# Patient Record
Sex: Male | Born: 1945
Health system: Southern US, Community
[De-identification: ages and names within clinical notes are randomized; demographics above are authoritative.]

## PROBLEM LIST (undated history)

## (undated) DIAGNOSIS — R3121 Asymptomatic microscopic hematuria: Secondary | ICD-10-CM

## (undated) DIAGNOSIS — J45909 Unspecified asthma, uncomplicated: Secondary | ICD-10-CM

## (undated) DIAGNOSIS — R918 Other nonspecific abnormal finding of lung field: Secondary | ICD-10-CM

## (undated) DIAGNOSIS — J309 Allergic rhinitis, unspecified: Secondary | ICD-10-CM

## (undated) DIAGNOSIS — I483 Typical atrial flutter: Secondary | ICD-10-CM

## (undated) DIAGNOSIS — R0600 Dyspnea, unspecified: Secondary | ICD-10-CM

## (undated) DIAGNOSIS — Z7901 Long term (current) use of anticoagulants: Secondary | ICD-10-CM

## (undated) DIAGNOSIS — R791 Abnormal coagulation profile: Secondary | ICD-10-CM

## (undated) DIAGNOSIS — I119 Hypertensive heart disease without heart failure: Secondary | ICD-10-CM

## (undated) DIAGNOSIS — I480 Paroxysmal atrial fibrillation: Secondary | ICD-10-CM

## (undated) DIAGNOSIS — E7849 Other hyperlipidemia: Secondary | ICD-10-CM

## (undated) DIAGNOSIS — J90 Pleural effusion, not elsewhere classified: Secondary | ICD-10-CM

## (undated) DIAGNOSIS — Z9889 Other specified postprocedural states: Secondary | ICD-10-CM

## (undated) DIAGNOSIS — I4892 Unspecified atrial flutter: Secondary | ICD-10-CM

## (undated) DIAGNOSIS — G8929 Other chronic pain: Secondary | ICD-10-CM

## (undated) DIAGNOSIS — R21 Rash and other nonspecific skin eruption: Secondary | ICD-10-CM

## (undated) DIAGNOSIS — M549 Dorsalgia, unspecified: Secondary | ICD-10-CM

## (undated) DIAGNOSIS — J449 Chronic obstructive pulmonary disease, unspecified: Secondary | ICD-10-CM

## (undated) DIAGNOSIS — I272 Pulmonary hypertension, unspecified: Secondary | ICD-10-CM

## (undated) DIAGNOSIS — S46819A Strain of other muscles, fascia and tendons at shoulder and upper arm level, unspecified arm, initial encounter: Secondary | ICD-10-CM

## (undated) DIAGNOSIS — I4891 Unspecified atrial fibrillation: Secondary | ICD-10-CM

## (undated) DIAGNOSIS — R05 Cough: Secondary | ICD-10-CM

## (undated) DIAGNOSIS — Z125 Encounter for screening for malignant neoplasm of prostate: Secondary | ICD-10-CM

## (undated) DIAGNOSIS — Z951 Presence of aortocoronary bypass graft: Secondary | ICD-10-CM

## (undated) DIAGNOSIS — D3001 Benign neoplasm of right kidney: Secondary | ICD-10-CM

## (undated) DIAGNOSIS — J811 Chronic pulmonary edema: Secondary | ICD-10-CM

## (undated) DIAGNOSIS — I472 Ventricular tachycardia: Secondary | ICD-10-CM

## (undated) DIAGNOSIS — I509 Heart failure, unspecified: Secondary | ICD-10-CM

## (undated) DIAGNOSIS — M5416 Radiculopathy, lumbar region: Secondary | ICD-10-CM

## (undated) DIAGNOSIS — R55 Syncope and collapse: Secondary | ICD-10-CM

## (undated) DIAGNOSIS — N411 Chronic prostatitis: Secondary | ICD-10-CM

## (undated) DIAGNOSIS — R7881 Bacteremia: Secondary | ICD-10-CM

## (undated) DIAGNOSIS — I10 Essential (primary) hypertension: Secondary | ICD-10-CM

## (undated) DIAGNOSIS — E785 Hyperlipidemia, unspecified: Secondary | ICD-10-CM

## (undated) DIAGNOSIS — B952 Enterococcus as the cause of diseases classified elsewhere: Secondary | ICD-10-CM

## (undated) DIAGNOSIS — R0982 Postnasal drip: Secondary | ICD-10-CM

## (undated) DIAGNOSIS — I25119 Atherosclerotic heart disease of native coronary artery with unspecified angina pectoris: Secondary | ICD-10-CM

## (undated) DIAGNOSIS — R002 Palpitations: Secondary | ICD-10-CM

## (undated) DIAGNOSIS — L720 Epidermal cyst: Secondary | ICD-10-CM

## (undated) DIAGNOSIS — M310 Hypersensitivity angiitis: Secondary | ICD-10-CM

## (undated) DIAGNOSIS — F419 Anxiety disorder, unspecified: Secondary | ICD-10-CM

## (undated) DIAGNOSIS — Z952 Presence of prosthetic heart valve: Secondary | ICD-10-CM

## (undated) DIAGNOSIS — I482 Chronic atrial fibrillation, unspecified: Secondary | ICD-10-CM

## (undated) DIAGNOSIS — R600 Localized edema: Secondary | ICD-10-CM

## (undated) DIAGNOSIS — N200 Calculus of kidney: Secondary | ICD-10-CM

## (undated) HISTORY — DX: Benign neoplasm of right kidney: D30.01

## (undated) HISTORY — DX: Other hyperlipidemia: E78.49

## (undated) HISTORY — DX: Unspecified atrial fibrillation: I48.91

## (undated) HISTORY — DX: Long term (current) use of anticoagulants: Z79.01

## (undated) HISTORY — DX: Other specified postprocedural states: Z98.890

## (undated) HISTORY — DX: Other nonspecific abnormal finding of lung field: R91.8

## (undated) HISTORY — DX: Rash and other nonspecific skin eruption: R21

## (undated) HISTORY — DX: Hypersensitivity angiitis: M31.0

## (undated) HISTORY — DX: Hyperlipidemia, unspecified: E78.5

## (undated) HISTORY — DX: Essential (primary) hypertension: I10

## (undated) HISTORY — DX: Enterococcus as the cause of diseases classified elsewhere: B95.2

## (undated) HISTORY — DX: Epidermal cyst: L72.0

## (undated) HISTORY — DX: Chronic prostatitis: N41.1

## (undated) HISTORY — DX: Postnasal drip: R09.82

## (undated) HISTORY — DX: Allergic rhinitis, unspecified: J30.9

## (undated) HISTORY — PX: FOOT SURGERY: SHX648

## (undated) HISTORY — DX: Pulmonary hypertension, unspecified: I27.20

## (undated) HISTORY — DX: Pleural effusion, not elsewhere classified: J90

## (undated) HISTORY — DX: Enterococcus as the cause of diseases classified elsewhere: R78.81

## (undated) HISTORY — DX: Radiculopathy, lumbar region: M54.16

## (undated) HISTORY — DX: Dyspnea, unspecified: R06.00

## (undated) HISTORY — DX: Asymptomatic microscopic hematuria: R31.21

## (undated) HISTORY — DX: Atherosclerotic heart disease of native coronary artery with unspecified angina pectoris: I25.119

## (undated) HISTORY — DX: Chronic atrial fibrillation, unspecified: I48.20

## (undated) HISTORY — PX: CORONARY ARTERY BYPASS GRAFT: SHX141

## (undated) HISTORY — PX: FRACTURE SURGERY: SHX138

## (undated) HISTORY — DX: Hypertensive heart disease without heart failure: I11.9

## (undated) HISTORY — DX: Chronic pulmonary edema: J81.1

## (undated) HISTORY — DX: Paroxysmal atrial fibrillation: I48.0

## (undated) HISTORY — DX: Presence of prosthetic heart valve: Z95.2

## (undated) HISTORY — PX: MECHANICAL AORTIC VALVE REPLACEMENT: SHX2013

## (undated) HISTORY — DX: Abnormal coagulation profile: R79.1

## (undated) HISTORY — DX: Anxiety disorder, unspecified: F41.9

## (undated) HISTORY — PX: CHOLECYSTECTOMY: SHX55

## (undated) HISTORY — DX: Dorsalgia, unspecified: M54.9

## (undated) HISTORY — PX: VASECTOMY: SHX75

## (undated) HISTORY — DX: Localized edema: R60.0

## (undated) HISTORY — DX: Presence of aortocoronary bypass graft: Z95.1

## (undated) HISTORY — DX: Unspecified atrial flutter: I48.92

## (undated) HISTORY — DX: Palpitations: R00.2

## (undated) HISTORY — DX: Other chronic pain: G89.29

## (undated) HISTORY — DX: Cough: R05

## (undated) HISTORY — DX: Calculus of kidney: N20.0

## (undated) HISTORY — DX: Syncope and collapse: R55

## (undated) HISTORY — DX: Strain of other muscles, fascia and tendons at shoulder and upper arm level, unspecified arm, initial encounter: S46.819A

## (undated) HISTORY — PX: NASAL SINUS SURGERY: SHX719

## (undated) HISTORY — DX: Encounter for screening for malignant neoplasm of prostate: Z12.5

## (undated) HISTORY — DX: Ventricular tachycardia: I47.2

## (undated) HISTORY — PX: HERNIA REPAIR: SHX51

## (undated) HISTORY — DX: Typical atrial flutter: I48.3

## (undated) HISTORY — DX: Chronic obstructive pulmonary disease, unspecified: J44.9

---

## 2015-07-23 DIAGNOSIS — N200 Calculus of kidney: Secondary | ICD-10-CM | POA: Insufficient documentation

## 2015-07-23 DIAGNOSIS — I482 Chronic atrial fibrillation, unspecified: Secondary | ICD-10-CM | POA: Insufficient documentation

## 2015-07-23 DIAGNOSIS — N411 Chronic prostatitis: Secondary | ICD-10-CM

## 2015-07-23 HISTORY — DX: Chronic atrial fibrillation, unspecified: I48.20

## 2015-07-23 HISTORY — DX: Calculus of kidney: N20.0

## 2015-07-23 HISTORY — DX: Chronic prostatitis: N41.1

## 2015-12-14 DIAGNOSIS — I10 Essential (primary) hypertension: Secondary | ICD-10-CM

## 2015-12-14 DIAGNOSIS — M549 Dorsalgia, unspecified: Secondary | ICD-10-CM

## 2015-12-14 DIAGNOSIS — G8929 Other chronic pain: Secondary | ICD-10-CM

## 2015-12-14 HISTORY — DX: Other chronic pain: G89.29

## 2015-12-14 HISTORY — DX: Dorsalgia, unspecified: M54.9

## 2015-12-14 HISTORY — DX: Essential (primary) hypertension: I10

## 2016-01-13 DIAGNOSIS — I482 Chronic atrial fibrillation: Secondary | ICD-10-CM | POA: Diagnosis not present

## 2016-01-19 DIAGNOSIS — M5416 Radiculopathy, lumbar region: Secondary | ICD-10-CM

## 2016-01-19 DIAGNOSIS — G8929 Other chronic pain: Secondary | ICD-10-CM | POA: Diagnosis not present

## 2016-01-19 DIAGNOSIS — M545 Low back pain: Secondary | ICD-10-CM | POA: Diagnosis not present

## 2016-01-19 HISTORY — DX: Radiculopathy, lumbar region: M54.16

## 2016-01-20 DIAGNOSIS — R17 Unspecified jaundice: Secondary | ICD-10-CM | POA: Diagnosis not present

## 2016-01-20 DIAGNOSIS — I482 Chronic atrial fibrillation: Secondary | ICD-10-CM | POA: Diagnosis not present

## 2016-01-27 DIAGNOSIS — I482 Chronic atrial fibrillation: Secondary | ICD-10-CM | POA: Diagnosis not present

## 2016-02-01 DIAGNOSIS — I483 Typical atrial flutter: Secondary | ICD-10-CM | POA: Insufficient documentation

## 2016-02-01 DIAGNOSIS — Z952 Presence of prosthetic heart valve: Secondary | ICD-10-CM | POA: Diagnosis not present

## 2016-02-01 DIAGNOSIS — R0600 Dyspnea, unspecified: Secondary | ICD-10-CM

## 2016-02-01 DIAGNOSIS — R55 Syncope and collapse: Secondary | ICD-10-CM

## 2016-02-01 DIAGNOSIS — R0609 Other forms of dyspnea: Secondary | ICD-10-CM | POA: Diagnosis not present

## 2016-02-01 HISTORY — DX: Syncope and collapse: R55

## 2016-02-01 HISTORY — DX: Dyspnea, unspecified: R06.00

## 2016-02-01 HISTORY — DX: Typical atrial flutter: I48.3

## 2016-02-02 DIAGNOSIS — N281 Cyst of kidney, acquired: Secondary | ICD-10-CM | POA: Diagnosis not present

## 2016-02-02 DIAGNOSIS — K8020 Calculus of gallbladder without cholecystitis without obstruction: Secondary | ICD-10-CM | POA: Diagnosis not present

## 2016-02-02 DIAGNOSIS — N2889 Other specified disorders of kidney and ureter: Secondary | ICD-10-CM | POA: Diagnosis not present

## 2016-02-03 DIAGNOSIS — I482 Chronic atrial fibrillation: Secondary | ICD-10-CM | POA: Diagnosis not present

## 2016-02-03 DIAGNOSIS — R0609 Other forms of dyspnea: Secondary | ICD-10-CM | POA: Diagnosis not present

## 2016-02-09 DIAGNOSIS — M545 Low back pain: Secondary | ICD-10-CM | POA: Diagnosis not present

## 2016-02-09 DIAGNOSIS — M546 Pain in thoracic spine: Secondary | ICD-10-CM | POA: Diagnosis not present

## 2016-02-15 DIAGNOSIS — M545 Low back pain: Secondary | ICD-10-CM | POA: Diagnosis not present

## 2016-02-15 DIAGNOSIS — R002 Palpitations: Secondary | ICD-10-CM | POA: Diagnosis not present

## 2016-02-15 DIAGNOSIS — M546 Pain in thoracic spine: Secondary | ICD-10-CM | POA: Diagnosis not present

## 2016-02-16 DIAGNOSIS — M545 Low back pain: Secondary | ICD-10-CM | POA: Diagnosis not present

## 2016-02-16 DIAGNOSIS — M546 Pain in thoracic spine: Secondary | ICD-10-CM | POA: Diagnosis not present

## 2016-02-17 DIAGNOSIS — I482 Chronic atrial fibrillation: Secondary | ICD-10-CM | POA: Diagnosis not present

## 2016-02-17 DIAGNOSIS — I349 Nonrheumatic mitral valve disorder, unspecified: Secondary | ICD-10-CM | POA: Diagnosis not present

## 2016-02-17 DIAGNOSIS — M545 Low back pain: Secondary | ICD-10-CM | POA: Diagnosis not present

## 2016-02-17 DIAGNOSIS — M546 Pain in thoracic spine: Secondary | ICD-10-CM | POA: Diagnosis not present

## 2016-02-21 DIAGNOSIS — R002 Palpitations: Secondary | ICD-10-CM | POA: Diagnosis not present

## 2016-02-23 DIAGNOSIS — M545 Low back pain: Secondary | ICD-10-CM | POA: Diagnosis not present

## 2016-02-23 DIAGNOSIS — M546 Pain in thoracic spine: Secondary | ICD-10-CM | POA: Diagnosis not present

## 2016-02-24 DIAGNOSIS — R55 Syncope and collapse: Secondary | ICD-10-CM | POA: Diagnosis not present

## 2016-02-25 DIAGNOSIS — M545 Low back pain: Secondary | ICD-10-CM | POA: Diagnosis not present

## 2016-02-25 DIAGNOSIS — M546 Pain in thoracic spine: Secondary | ICD-10-CM | POA: Diagnosis not present

## 2016-02-25 DIAGNOSIS — R0982 Postnasal drip: Secondary | ICD-10-CM

## 2016-02-25 DIAGNOSIS — R0609 Other forms of dyspnea: Secondary | ICD-10-CM | POA: Diagnosis not present

## 2016-02-25 DIAGNOSIS — J3489 Other specified disorders of nose and nasal sinuses: Secondary | ICD-10-CM

## 2016-02-25 HISTORY — DX: Other specified disorders of nose and nasal sinuses: J34.89

## 2016-02-25 HISTORY — DX: Postnasal drip: R09.82

## 2016-02-28 DIAGNOSIS — M545 Low back pain: Secondary | ICD-10-CM | POA: Diagnosis not present

## 2016-02-28 DIAGNOSIS — M546 Pain in thoracic spine: Secondary | ICD-10-CM | POA: Diagnosis not present

## 2016-03-02 DIAGNOSIS — R0609 Other forms of dyspnea: Secondary | ICD-10-CM | POA: Diagnosis not present

## 2016-03-02 DIAGNOSIS — I482 Chronic atrial fibrillation: Secondary | ICD-10-CM | POA: Diagnosis not present

## 2016-03-03 DIAGNOSIS — M545 Low back pain: Secondary | ICD-10-CM | POA: Diagnosis not present

## 2016-03-03 DIAGNOSIS — M546 Pain in thoracic spine: Secondary | ICD-10-CM | POA: Diagnosis not present

## 2016-03-07 DIAGNOSIS — M545 Low back pain: Secondary | ICD-10-CM | POA: Diagnosis not present

## 2016-03-07 DIAGNOSIS — M546 Pain in thoracic spine: Secondary | ICD-10-CM | POA: Diagnosis not present

## 2016-03-09 DIAGNOSIS — M546 Pain in thoracic spine: Secondary | ICD-10-CM | POA: Diagnosis not present

## 2016-03-09 DIAGNOSIS — M545 Low back pain: Secondary | ICD-10-CM | POA: Diagnosis not present

## 2016-03-10 DIAGNOSIS — R55 Syncope and collapse: Secondary | ICD-10-CM | POA: Diagnosis not present

## 2016-03-13 DIAGNOSIS — R740 Nonspecific elevation of levels of transaminase and lactic acid dehydrogenase [LDH]: Secondary | ICD-10-CM | POA: Diagnosis not present

## 2016-03-14 DIAGNOSIS — M545 Low back pain: Secondary | ICD-10-CM | POA: Diagnosis not present

## 2016-03-14 DIAGNOSIS — M546 Pain in thoracic spine: Secondary | ICD-10-CM | POA: Diagnosis not present

## 2016-03-16 DIAGNOSIS — M546 Pain in thoracic spine: Secondary | ICD-10-CM | POA: Diagnosis not present

## 2016-03-16 DIAGNOSIS — M545 Low back pain: Secondary | ICD-10-CM | POA: Diagnosis not present

## 2016-03-20 DIAGNOSIS — N200 Calculus of kidney: Secondary | ICD-10-CM | POA: Diagnosis not present

## 2016-03-20 DIAGNOSIS — I482 Chronic atrial fibrillation: Secondary | ICD-10-CM | POA: Diagnosis not present

## 2016-03-20 DIAGNOSIS — I483 Typical atrial flutter: Secondary | ICD-10-CM | POA: Diagnosis not present

## 2016-03-20 DIAGNOSIS — I10 Essential (primary) hypertension: Secondary | ICD-10-CM | POA: Diagnosis not present

## 2016-03-20 DIAGNOSIS — Z952 Presence of prosthetic heart valve: Secondary | ICD-10-CM | POA: Insufficient documentation

## 2016-03-20 HISTORY — DX: Presence of prosthetic heart valve: Z95.2

## 2016-03-30 DIAGNOSIS — I4891 Unspecified atrial fibrillation: Secondary | ICD-10-CM | POA: Diagnosis not present

## 2016-04-28 DIAGNOSIS — J309 Allergic rhinitis, unspecified: Secondary | ICD-10-CM | POA: Insufficient documentation

## 2016-04-28 DIAGNOSIS — R0609 Other forms of dyspnea: Secondary | ICD-10-CM | POA: Diagnosis not present

## 2016-04-28 HISTORY — DX: Allergic rhinitis, unspecified: J30.9

## 2016-05-03 DIAGNOSIS — D1771 Benign lipomatous neoplasm of kidney: Secondary | ICD-10-CM

## 2016-05-03 DIAGNOSIS — N200 Calculus of kidney: Secondary | ICD-10-CM | POA: Diagnosis not present

## 2016-05-03 DIAGNOSIS — D3001 Benign neoplasm of right kidney: Secondary | ICD-10-CM | POA: Diagnosis not present

## 2016-05-03 DIAGNOSIS — N411 Chronic prostatitis: Secondary | ICD-10-CM | POA: Diagnosis not present

## 2016-05-03 HISTORY — DX: Benign lipomatous neoplasm of kidney: D17.71

## 2016-05-04 DIAGNOSIS — I482 Chronic atrial fibrillation: Secondary | ICD-10-CM | POA: Diagnosis not present

## 2016-05-09 DIAGNOSIS — J310 Chronic rhinitis: Secondary | ICD-10-CM | POA: Diagnosis not present

## 2016-05-10 DIAGNOSIS — F419 Anxiety disorder, unspecified: Secondary | ICD-10-CM

## 2016-05-10 DIAGNOSIS — R0609 Other forms of dyspnea: Secondary | ICD-10-CM | POA: Diagnosis not present

## 2016-05-10 DIAGNOSIS — I10 Essential (primary) hypertension: Secondary | ICD-10-CM | POA: Diagnosis not present

## 2016-05-10 HISTORY — DX: Anxiety disorder, unspecified: F41.9

## 2016-05-29 DIAGNOSIS — I482 Chronic atrial fibrillation: Secondary | ICD-10-CM | POA: Diagnosis not present

## 2016-06-07 DIAGNOSIS — J8410 Pulmonary fibrosis, unspecified: Secondary | ICD-10-CM | POA: Diagnosis not present

## 2016-06-07 DIAGNOSIS — R0609 Other forms of dyspnea: Secondary | ICD-10-CM | POA: Diagnosis not present

## 2016-06-07 DIAGNOSIS — J439 Emphysema, unspecified: Secondary | ICD-10-CM | POA: Diagnosis not present

## 2016-06-07 DIAGNOSIS — R918 Other nonspecific abnormal finding of lung field: Secondary | ICD-10-CM | POA: Diagnosis not present

## 2016-06-12 DIAGNOSIS — J449 Chronic obstructive pulmonary disease, unspecified: Secondary | ICD-10-CM

## 2016-06-12 DIAGNOSIS — J309 Allergic rhinitis, unspecified: Secondary | ICD-10-CM | POA: Diagnosis not present

## 2016-06-12 DIAGNOSIS — R918 Other nonspecific abnormal finding of lung field: Secondary | ICD-10-CM

## 2016-06-12 HISTORY — DX: Chronic obstructive pulmonary disease, unspecified: J44.9

## 2016-06-12 HISTORY — DX: Other nonspecific abnormal finding of lung field: R91.8

## 2016-06-19 DIAGNOSIS — I10 Essential (primary) hypertension: Secondary | ICD-10-CM | POA: Diagnosis not present

## 2016-06-19 DIAGNOSIS — R55 Syncope and collapse: Secondary | ICD-10-CM | POA: Diagnosis not present

## 2016-06-19 DIAGNOSIS — I483 Typical atrial flutter: Secondary | ICD-10-CM | POA: Diagnosis not present

## 2016-06-19 DIAGNOSIS — I482 Chronic atrial fibrillation: Secondary | ICD-10-CM | POA: Diagnosis not present

## 2016-06-21 DIAGNOSIS — I10 Essential (primary) hypertension: Secondary | ICD-10-CM | POA: Diagnosis not present

## 2016-06-21 DIAGNOSIS — J309 Allergic rhinitis, unspecified: Secondary | ICD-10-CM | POA: Diagnosis not present

## 2016-06-21 DIAGNOSIS — F419 Anxiety disorder, unspecified: Secondary | ICD-10-CM | POA: Diagnosis not present

## 2016-07-04 DIAGNOSIS — I482 Chronic atrial fibrillation: Secondary | ICD-10-CM | POA: Diagnosis not present

## 2016-08-08 DIAGNOSIS — I482 Chronic atrial fibrillation: Secondary | ICD-10-CM | POA: Diagnosis not present

## 2016-08-08 DIAGNOSIS — I483 Typical atrial flutter: Secondary | ICD-10-CM | POA: Diagnosis not present

## 2016-08-08 DIAGNOSIS — I10 Essential (primary) hypertension: Secondary | ICD-10-CM | POA: Diagnosis not present

## 2016-09-19 DIAGNOSIS — Z23 Encounter for immunization: Secondary | ICD-10-CM | POA: Diagnosis not present

## 2016-09-19 DIAGNOSIS — I10 Essential (primary) hypertension: Secondary | ICD-10-CM | POA: Diagnosis not present

## 2016-09-19 DIAGNOSIS — Z0000 Encounter for general adult medical examination without abnormal findings: Secondary | ICD-10-CM | POA: Diagnosis not present

## 2016-09-19 DIAGNOSIS — Z125 Encounter for screening for malignant neoplasm of prostate: Secondary | ICD-10-CM | POA: Diagnosis not present

## 2016-09-19 DIAGNOSIS — I482 Chronic atrial fibrillation: Secondary | ICD-10-CM | POA: Diagnosis not present

## 2016-10-17 DIAGNOSIS — I482 Chronic atrial fibrillation: Secondary | ICD-10-CM | POA: Diagnosis not present

## 2016-10-17 DIAGNOSIS — R059 Cough, unspecified: Secondary | ICD-10-CM | POA: Insufficient documentation

## 2016-10-17 DIAGNOSIS — Z136 Encounter for screening for cardiovascular disorders: Secondary | ICD-10-CM | POA: Diagnosis not present

## 2016-10-17 DIAGNOSIS — R05 Cough: Secondary | ICD-10-CM | POA: Diagnosis not present

## 2016-10-17 DIAGNOSIS — Z87891 Personal history of nicotine dependence: Secondary | ICD-10-CM | POA: Diagnosis not present

## 2016-10-17 HISTORY — DX: Cough, unspecified: R05.9

## 2016-10-24 DIAGNOSIS — I482 Chronic atrial fibrillation: Secondary | ICD-10-CM | POA: Diagnosis not present

## 2016-11-21 DIAGNOSIS — I482 Chronic atrial fibrillation: Secondary | ICD-10-CM | POA: Diagnosis not present

## 2016-11-28 DIAGNOSIS — R918 Other nonspecific abnormal finding of lung field: Secondary | ICD-10-CM | POA: Diagnosis not present

## 2016-11-28 DIAGNOSIS — J45909 Unspecified asthma, uncomplicated: Secondary | ICD-10-CM | POA: Diagnosis not present

## 2016-12-26 DIAGNOSIS — I482 Chronic atrial fibrillation: Secondary | ICD-10-CM | POA: Diagnosis not present

## 2017-01-09 DIAGNOSIS — Z87442 Personal history of urinary calculi: Secondary | ICD-10-CM | POA: Diagnosis not present

## 2017-01-09 DIAGNOSIS — D3001 Benign neoplasm of right kidney: Secondary | ICD-10-CM | POA: Diagnosis not present

## 2017-01-09 DIAGNOSIS — N200 Calculus of kidney: Secondary | ICD-10-CM | POA: Diagnosis not present

## 2017-02-13 DIAGNOSIS — I482 Chronic atrial fibrillation: Secondary | ICD-10-CM | POA: Diagnosis not present

## 2017-02-22 DIAGNOSIS — I10 Essential (primary) hypertension: Secondary | ICD-10-CM | POA: Diagnosis not present

## 2017-02-22 DIAGNOSIS — J0100 Acute maxillary sinusitis, unspecified: Secondary | ICD-10-CM | POA: Diagnosis not present

## 2017-02-22 DIAGNOSIS — Z952 Presence of prosthetic heart valve: Secondary | ICD-10-CM | POA: Diagnosis not present

## 2017-02-22 DIAGNOSIS — Z6825 Body mass index (BMI) 25.0-25.9, adult: Secondary | ICD-10-CM | POA: Diagnosis not present

## 2017-02-26 DIAGNOSIS — Z952 Presence of prosthetic heart valve: Secondary | ICD-10-CM | POA: Diagnosis not present

## 2017-02-28 DIAGNOSIS — R918 Other nonspecific abnormal finding of lung field: Secondary | ICD-10-CM | POA: Diagnosis not present

## 2017-02-28 DIAGNOSIS — J439 Emphysema, unspecified: Secondary | ICD-10-CM | POA: Diagnosis not present

## 2017-02-28 DIAGNOSIS — J449 Chronic obstructive pulmonary disease, unspecified: Secondary | ICD-10-CM | POA: Diagnosis not present

## 2017-02-28 DIAGNOSIS — R590 Localized enlarged lymph nodes: Secondary | ICD-10-CM | POA: Diagnosis not present

## 2017-02-28 DIAGNOSIS — J45909 Unspecified asthma, uncomplicated: Secondary | ICD-10-CM | POA: Diagnosis not present

## 2017-02-28 DIAGNOSIS — J8410 Pulmonary fibrosis, unspecified: Secondary | ICD-10-CM | POA: Diagnosis not present

## 2017-03-07 DIAGNOSIS — F411 Generalized anxiety disorder: Secondary | ICD-10-CM | POA: Diagnosis not present

## 2017-03-07 DIAGNOSIS — T148XXA Other injury of unspecified body region, initial encounter: Secondary | ICD-10-CM | POA: Diagnosis not present

## 2017-03-29 DIAGNOSIS — Z951 Presence of aortocoronary bypass graft: Secondary | ICD-10-CM

## 2017-03-29 DIAGNOSIS — I4891 Unspecified atrial fibrillation: Secondary | ICD-10-CM | POA: Insufficient documentation

## 2017-03-29 DIAGNOSIS — E785 Hyperlipidemia, unspecified: Secondary | ICD-10-CM

## 2017-03-29 DIAGNOSIS — I4892 Unspecified atrial flutter: Secondary | ICD-10-CM | POA: Insufficient documentation

## 2017-03-29 DIAGNOSIS — R55 Syncope and collapse: Secondary | ICD-10-CM | POA: Insufficient documentation

## 2017-03-29 DIAGNOSIS — I4729 Other ventricular tachycardia: Secondary | ICD-10-CM

## 2017-03-29 DIAGNOSIS — Z9889 Other specified postprocedural states: Secondary | ICD-10-CM

## 2017-03-29 DIAGNOSIS — Z7901 Long term (current) use of anticoagulants: Secondary | ICD-10-CM

## 2017-03-29 DIAGNOSIS — I472 Ventricular tachycardia: Secondary | ICD-10-CM

## 2017-03-29 DIAGNOSIS — Z952 Presence of prosthetic heart valve: Secondary | ICD-10-CM

## 2017-03-29 DIAGNOSIS — I480 Paroxysmal atrial fibrillation: Secondary | ICD-10-CM | POA: Insufficient documentation

## 2017-03-29 DIAGNOSIS — I25119 Atherosclerotic heart disease of native coronary artery with unspecified angina pectoris: Secondary | ICD-10-CM

## 2017-03-29 DIAGNOSIS — I110 Hypertensive heart disease with heart failure: Secondary | ICD-10-CM | POA: Insufficient documentation

## 2017-03-29 DIAGNOSIS — I119 Hypertensive heart disease without heart failure: Secondary | ICD-10-CM

## 2017-03-29 DIAGNOSIS — I2510 Atherosclerotic heart disease of native coronary artery without angina pectoris: Secondary | ICD-10-CM

## 2017-03-29 DIAGNOSIS — E7849 Other hyperlipidemia: Secondary | ICD-10-CM

## 2017-03-29 HISTORY — DX: Atherosclerotic heart disease of native coronary artery without angina pectoris: I25.10

## 2017-03-29 HISTORY — DX: Other ventricular tachycardia: I47.29

## 2017-03-29 HISTORY — DX: Ventricular tachycardia: I47.2

## 2017-03-29 HISTORY — DX: Unspecified atrial flutter: I48.92

## 2017-03-29 HISTORY — DX: Hypertensive heart disease with heart failure: I11.0

## 2017-03-29 HISTORY — DX: Long term (current) use of anticoagulants: Z79.01

## 2017-03-29 HISTORY — DX: Unspecified atrial fibrillation: I48.91

## 2017-03-29 HISTORY — DX: Hyperlipidemia, unspecified: E78.5

## 2017-03-29 HISTORY — DX: Syncope and collapse: R55

## 2017-03-29 HISTORY — DX: Presence of aortocoronary bypass graft: Z95.1

## 2017-03-29 HISTORY — DX: Presence of prosthetic heart valve: Z95.2

## 2017-03-29 HISTORY — DX: Paroxysmal atrial fibrillation: I48.0

## 2017-03-29 HISTORY — DX: Other hyperlipidemia: E78.49

## 2017-03-29 HISTORY — DX: Hypertensive heart disease without heart failure: I11.9

## 2017-03-29 HISTORY — DX: Atherosclerotic heart disease of native coronary artery with unspecified angina pectoris: I25.119

## 2017-03-29 HISTORY — DX: Other specified postprocedural states: Z98.890

## 2017-03-30 DIAGNOSIS — I25119 Atherosclerotic heart disease of native coronary artery with unspecified angina pectoris: Secondary | ICD-10-CM | POA: Diagnosis not present

## 2017-03-30 DIAGNOSIS — I483 Typical atrial flutter: Secondary | ICD-10-CM | POA: Diagnosis not present

## 2017-03-30 DIAGNOSIS — Z952 Presence of prosthetic heart valve: Secondary | ICD-10-CM | POA: Diagnosis not present

## 2017-03-30 DIAGNOSIS — R55 Syncope and collapse: Secondary | ICD-10-CM | POA: Diagnosis not present

## 2017-03-30 DIAGNOSIS — Z7901 Long term (current) use of anticoagulants: Secondary | ICD-10-CM | POA: Diagnosis not present

## 2017-03-30 DIAGNOSIS — I472 Ventricular tachycardia: Secondary | ICD-10-CM | POA: Diagnosis not present

## 2017-03-30 DIAGNOSIS — Z951 Presence of aortocoronary bypass graft: Secondary | ICD-10-CM | POA: Diagnosis not present

## 2017-03-30 DIAGNOSIS — I480 Paroxysmal atrial fibrillation: Secondary | ICD-10-CM | POA: Diagnosis not present

## 2017-03-30 DIAGNOSIS — Z9889 Other specified postprocedural states: Secondary | ICD-10-CM | POA: Diagnosis not present

## 2017-03-30 DIAGNOSIS — I119 Hypertensive heart disease without heart failure: Secondary | ICD-10-CM | POA: Diagnosis not present

## 2017-03-30 DIAGNOSIS — E784 Other hyperlipidemia: Secondary | ICD-10-CM | POA: Diagnosis not present

## 2017-03-30 DIAGNOSIS — J40 Bronchitis, not specified as acute or chronic: Secondary | ICD-10-CM | POA: Diagnosis not present

## 2017-04-02 DIAGNOSIS — Z952 Presence of prosthetic heart valve: Secondary | ICD-10-CM | POA: Diagnosis not present

## 2017-05-01 DIAGNOSIS — I350 Nonrheumatic aortic (valve) stenosis: Secondary | ICD-10-CM | POA: Diagnosis not present

## 2017-05-01 DIAGNOSIS — Z952 Presence of prosthetic heart valve: Secondary | ICD-10-CM | POA: Diagnosis not present

## 2017-06-01 DIAGNOSIS — J302 Other seasonal allergic rhinitis: Secondary | ICD-10-CM | POA: Diagnosis not present

## 2017-06-01 DIAGNOSIS — Z952 Presence of prosthetic heart valve: Secondary | ICD-10-CM | POA: Diagnosis not present

## 2017-06-18 DIAGNOSIS — R0602 Shortness of breath: Secondary | ICD-10-CM | POA: Diagnosis not present

## 2017-06-18 DIAGNOSIS — J310 Chronic rhinitis: Secondary | ICD-10-CM | POA: Diagnosis not present

## 2017-06-20 DIAGNOSIS — R3129 Other microscopic hematuria: Secondary | ICD-10-CM

## 2017-06-20 DIAGNOSIS — Z125 Encounter for screening for malignant neoplasm of prostate: Secondary | ICD-10-CM | POA: Insufficient documentation

## 2017-06-20 DIAGNOSIS — N200 Calculus of kidney: Secondary | ICD-10-CM | POA: Diagnosis not present

## 2017-06-20 DIAGNOSIS — R3121 Asymptomatic microscopic hematuria: Secondary | ICD-10-CM

## 2017-06-20 DIAGNOSIS — D3001 Benign neoplasm of right kidney: Secondary | ICD-10-CM | POA: Diagnosis not present

## 2017-06-20 HISTORY — DX: Asymptomatic microscopic hematuria: R31.21

## 2017-06-20 HISTORY — DX: Encounter for screening for malignant neoplasm of prostate: Z12.5

## 2017-06-20 HISTORY — DX: Other microscopic hematuria: R31.29

## 2017-06-21 ENCOUNTER — Encounter: Payer: Self-pay | Admitting: Gastroenterology

## 2017-06-21 DIAGNOSIS — R1033 Periumbilical pain: Secondary | ICD-10-CM | POA: Diagnosis not present

## 2017-07-02 DIAGNOSIS — J449 Chronic obstructive pulmonary disease, unspecified: Secondary | ICD-10-CM | POA: Diagnosis not present

## 2017-07-02 DIAGNOSIS — J439 Emphysema, unspecified: Secondary | ICD-10-CM | POA: Diagnosis not present

## 2017-07-02 DIAGNOSIS — J849 Interstitial pulmonary disease, unspecified: Secondary | ICD-10-CM | POA: Diagnosis not present

## 2017-07-02 DIAGNOSIS — R0609 Other forms of dyspnea: Secondary | ICD-10-CM | POA: Diagnosis not present

## 2017-07-02 DIAGNOSIS — R918 Other nonspecific abnormal finding of lung field: Secondary | ICD-10-CM | POA: Diagnosis not present

## 2017-07-03 DIAGNOSIS — Z952 Presence of prosthetic heart valve: Secondary | ICD-10-CM | POA: Diagnosis not present

## 2017-07-12 ENCOUNTER — Encounter: Payer: Self-pay | Admitting: Gastroenterology

## 2017-07-12 DIAGNOSIS — K2900 Acute gastritis without bleeding: Secondary | ICD-10-CM | POA: Diagnosis not present

## 2017-07-12 DIAGNOSIS — K2930 Chronic superficial gastritis without bleeding: Secondary | ICD-10-CM | POA: Diagnosis not present

## 2017-07-12 DIAGNOSIS — R1033 Periumbilical pain: Secondary | ICD-10-CM | POA: Diagnosis not present

## 2017-07-12 HISTORY — PX: UPPER GASTROINTESTINAL ENDOSCOPY: SHX188

## 2017-07-25 DIAGNOSIS — I071 Rheumatic tricuspid insufficiency: Secondary | ICD-10-CM | POA: Diagnosis not present

## 2017-07-25 DIAGNOSIS — N5089 Other specified disorders of the male genital organs: Secondary | ICD-10-CM | POA: Diagnosis present

## 2017-07-25 DIAGNOSIS — K7210 Chronic hepatic failure without coma: Secondary | ICD-10-CM | POA: Diagnosis present

## 2017-07-25 DIAGNOSIS — R58 Hemorrhage, not elsewhere classified: Secondary | ICD-10-CM | POA: Diagnosis not present

## 2017-07-25 DIAGNOSIS — R7989 Other specified abnormal findings of blood chemistry: Secondary | ICD-10-CM | POA: Diagnosis not present

## 2017-07-25 DIAGNOSIS — E7800 Pure hypercholesterolemia, unspecified: Secondary | ICD-10-CM | POA: Diagnosis present

## 2017-07-25 DIAGNOSIS — Z4682 Encounter for fitting and adjustment of non-vascular catheter: Secondary | ICD-10-CM | POA: Diagnosis not present

## 2017-07-25 DIAGNOSIS — Z5181 Encounter for therapeutic drug level monitoring: Secondary | ICD-10-CM | POA: Diagnosis not present

## 2017-07-25 DIAGNOSIS — I483 Typical atrial flutter: Secondary | ICD-10-CM | POA: Diagnosis not present

## 2017-07-25 DIAGNOSIS — Z7901 Long term (current) use of anticoagulants: Secondary | ICD-10-CM | POA: Diagnosis not present

## 2017-07-25 DIAGNOSIS — R17 Unspecified jaundice: Secondary | ICD-10-CM | POA: Diagnosis not present

## 2017-07-25 DIAGNOSIS — I110 Hypertensive heart disease with heart failure: Secondary | ICD-10-CM | POA: Diagnosis present

## 2017-07-25 DIAGNOSIS — K91840 Postprocedural hemorrhage and hematoma of a digestive system organ or structure following a digestive system procedure: Secondary | ICD-10-CM | POA: Diagnosis not present

## 2017-07-25 DIAGNOSIS — Z4582 Encounter for adjustment or removal of myringotomy device (stent) (tube): Secondary | ICD-10-CM | POA: Diagnosis not present

## 2017-07-25 DIAGNOSIS — J9690 Respiratory failure, unspecified, unspecified whether with hypoxia or hypercapnia: Secondary | ICD-10-CM | POA: Diagnosis not present

## 2017-07-25 DIAGNOSIS — J449 Chronic obstructive pulmonary disease, unspecified: Secondary | ICD-10-CM | POA: Diagnosis not present

## 2017-07-25 DIAGNOSIS — Z952 Presence of prosthetic heart valve: Secondary | ICD-10-CM | POA: Diagnosis not present

## 2017-07-25 DIAGNOSIS — K8689 Other specified diseases of pancreas: Secondary | ICD-10-CM | POA: Diagnosis not present

## 2017-07-25 DIAGNOSIS — N200 Calculus of kidney: Secondary | ICD-10-CM | POA: Diagnosis not present

## 2017-07-25 DIAGNOSIS — N189 Chronic kidney disease, unspecified: Secondary | ICD-10-CM | POA: Diagnosis not present

## 2017-07-25 DIAGNOSIS — R079 Chest pain, unspecified: Secondary | ICD-10-CM | POA: Diagnosis not present

## 2017-07-25 DIAGNOSIS — K8020 Calculus of gallbladder without cholecystitis without obstruction: Secondary | ICD-10-CM | POA: Diagnosis not present

## 2017-07-25 DIAGNOSIS — E8809 Other disorders of plasma-protein metabolism, not elsewhere classified: Secondary | ICD-10-CM | POA: Diagnosis not present

## 2017-07-25 DIAGNOSIS — R1011 Right upper quadrant pain: Secondary | ICD-10-CM | POA: Diagnosis not present

## 2017-07-25 DIAGNOSIS — J9811 Atelectasis: Secondary | ICD-10-CM | POA: Diagnosis not present

## 2017-07-25 DIAGNOSIS — M5416 Radiculopathy, lumbar region: Secondary | ICD-10-CM | POA: Diagnosis present

## 2017-07-25 DIAGNOSIS — I2729 Other secondary pulmonary hypertension: Secondary | ICD-10-CM | POA: Diagnosis present

## 2017-07-25 DIAGNOSIS — K91870 Postprocedural hematoma of a digestive system organ or structure following a digestive system procedure: Secondary | ICD-10-CM | POA: Diagnosis not present

## 2017-07-25 DIAGNOSIS — I482 Chronic atrial fibrillation: Secondary | ICD-10-CM | POA: Diagnosis not present

## 2017-07-25 DIAGNOSIS — I129 Hypertensive chronic kidney disease with stage 1 through stage 4 chronic kidney disease, or unspecified chronic kidney disease: Secondary | ICD-10-CM | POA: Diagnosis not present

## 2017-07-25 DIAGNOSIS — I10 Essential (primary) hypertension: Secondary | ICD-10-CM | POA: Diagnosis not present

## 2017-07-25 DIAGNOSIS — R791 Abnormal coagulation profile: Secondary | ICD-10-CM | POA: Diagnosis not present

## 2017-07-25 DIAGNOSIS — Z01818 Encounter for other preprocedural examination: Secondary | ICD-10-CM | POA: Diagnosis not present

## 2017-07-25 DIAGNOSIS — R0689 Other abnormalities of breathing: Secondary | ICD-10-CM | POA: Diagnosis not present

## 2017-07-25 DIAGNOSIS — R600 Localized edema: Secondary | ICD-10-CM | POA: Diagnosis not present

## 2017-07-25 DIAGNOSIS — I4891 Unspecified atrial fibrillation: Secondary | ICD-10-CM | POA: Diagnosis not present

## 2017-07-25 DIAGNOSIS — K859 Acute pancreatitis without necrosis or infection, unspecified: Secondary | ICD-10-CM | POA: Diagnosis not present

## 2017-07-25 DIAGNOSIS — D62 Acute posthemorrhagic anemia: Secondary | ICD-10-CM | POA: Diagnosis not present

## 2017-07-25 DIAGNOSIS — M7989 Other specified soft tissue disorders: Secondary | ICD-10-CM | POA: Diagnosis not present

## 2017-07-25 DIAGNOSIS — K851 Biliary acute pancreatitis without necrosis or infection: Secondary | ICD-10-CM | POA: Diagnosis not present

## 2017-07-25 DIAGNOSIS — N411 Chronic prostatitis: Secondary | ICD-10-CM | POA: Diagnosis present

## 2017-07-25 DIAGNOSIS — J432 Centrilobular emphysema: Secondary | ICD-10-CM | POA: Diagnosis not present

## 2017-07-25 DIAGNOSIS — I50810 Right heart failure, unspecified: Secondary | ICD-10-CM | POA: Diagnosis present

## 2017-07-25 DIAGNOSIS — I959 Hypotension, unspecified: Secondary | ICD-10-CM | POA: Diagnosis not present

## 2017-07-25 DIAGNOSIS — N390 Urinary tract infection, site not specified: Secondary | ICD-10-CM | POA: Diagnosis not present

## 2017-07-25 DIAGNOSIS — R0902 Hypoxemia: Secondary | ICD-10-CM | POA: Diagnosis not present

## 2017-07-25 DIAGNOSIS — Z5331 Laparoscopic surgical procedure converted to open procedure: Secondary | ICD-10-CM | POA: Diagnosis not present

## 2017-07-25 DIAGNOSIS — R918 Other nonspecific abnormal finding of lung field: Secondary | ICD-10-CM | POA: Diagnosis not present

## 2017-07-25 DIAGNOSIS — I4892 Unspecified atrial flutter: Secondary | ICD-10-CM | POA: Diagnosis not present

## 2017-07-25 DIAGNOSIS — R911 Solitary pulmonary nodule: Secondary | ICD-10-CM | POA: Diagnosis present

## 2017-07-25 DIAGNOSIS — R188 Other ascites: Secondary | ICD-10-CM | POA: Diagnosis not present

## 2017-07-25 DIAGNOSIS — K811 Chronic cholecystitis: Secondary | ICD-10-CM | POA: Diagnosis not present

## 2017-07-25 DIAGNOSIS — I2510 Atherosclerotic heart disease of native coronary artery without angina pectoris: Secondary | ICD-10-CM | POA: Diagnosis present

## 2017-07-25 DIAGNOSIS — I9589 Other hypotension: Secondary | ICD-10-CM | POA: Diagnosis not present

## 2017-07-25 DIAGNOSIS — J90 Pleural effusion, not elsewhere classified: Secondary | ICD-10-CM | POA: Diagnosis not present

## 2017-07-25 DIAGNOSIS — I272 Pulmonary hypertension, unspecified: Secondary | ICD-10-CM | POA: Diagnosis not present

## 2017-07-25 DIAGNOSIS — R1084 Generalized abdominal pain: Secondary | ICD-10-CM | POA: Diagnosis not present

## 2017-07-26 DIAGNOSIS — R791 Abnormal coagulation profile: Secondary | ICD-10-CM

## 2017-07-26 HISTORY — DX: Abnormal coagulation profile: R79.1

## 2017-07-30 HISTORY — PX: EXPLORATORY LAPAROTOMY: SUR591

## 2017-08-03 ENCOUNTER — Ambulatory Visit: Payer: Self-pay | Admitting: Cardiology

## 2017-08-03 NOTE — Progress Notes (Deleted)
Cardiology Office Note:    Date:  08/03/2017   ID:  Lance Hicks, DOB [birth year], MRN [id number]  PCP:  No primary care provider on file.  Cardiologist:  Shirlee More, MD    Referring MD: No ref. provider found    ASSESSMENT:    No diagnosis found. PLAN:    In order of problems listed above:  1. ***   Next appointment: ***   Medication Adjustments/Labs and Tests Ordered: Current medicines are reviewed at length with the patient today.  Concerns regarding medicines are outlined above.  No orders of the defined types were placed in this encounter.  No orders of the defined types were placed in this encounter.   No chief complaint on file.   History of Present Illness:    Lance Hicks is a 71 y.o. male with a hx of chronic typical atrial flutter anticoagulated,,CAD, Paroxysmal Atrial Fibrillation with maze procedure, nonsustained VT, Dyslipidemia, HTN, S/P CABG, Valvular heart disease with AVR, Syncope  last seen in April 2018. Compliance with diet, lifestyle and medications: *** Past Medical History:  Diagnosis Date  . 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 In light of concurrent left nephrolithiasis, will check CT renal colic next year instead of renal US.   [name] Anxiety 05/10/2016   Last Assessment & Plan:  Doing well off of zoloft.  . Asymptomatic microscopic hematuria 06/20/2017   Last Assessment & Plan:  Had hematuria workup in [facility] in 2016 which negative CT and cystoscopy. UA with 2+ blood last visit - we discussed recommendation for repeat workup at 5 years or if degree of hematuria progresses.   . Atrial fibrillation ([place]) 03/29/2017  . Atrial flutter ([place]) 03/29/2017  . Chronic allergic rhinitis 04/28/2016   Last Assessment & Plan:  Continue astelin  . Chronic anticoagulation 03/29/2017  . Chronic atrial fibrillation ([place]) 07/23/2015   Last Assessment & Plan:  Coumadin and metoprolol, cardiology referral  to establish care.  . Chronic midline back pain 12/14/2015   Last Assessment & Plan:  Pain management referral for further evaluation.  . Chronic prostatitis 07/23/2015   Last Assessment & Plan:  Has largely resolved since stopping bike riding. Recommend annual DRE AND PSA - will see back 12/2015 for annual screening, given 1st degree fhx. To call office for recurrent prostatitis symptoms.   [name] COPD (chronic obstructive pulmonary disease) ([place]) 06/12/2016  . Coronary artery disease involving native coronary artery of native heart with angina pectoris ([place]) 03/29/2017  . Essential hypertension 12/14/2015   Last Assessment & Plan:  Hypertension control: controlled  Medications: compliant Medication Management: as noted in orders Home blood pressure monitoring recommended additionally as needed for symptoms  The patient's care plan was reviewed and updated. Instructions and counseling were provided regarding patient goals and barriers. He was counseled to adopt a healthy lifestyle. Educational resources and self-management tools have been provided as charted in [facility] list.   . H/O maze procedure 03/29/2017  . [name] 03/29/2017   Overview:  2011  . Hx of CABG 03/29/2017  . Hypertensive heart disease 03/29/2017  . Kidney stones 07/23/2015   Overview:  x 3  Last Assessment & Plan:  By [place] has left nephrolithiasis, but not visible by KUB. Will check CT renal colic next year to assess both stone burden as well as to surveil AML.   . Lumbar radicular pain 01/19/2016  . Non-sustained ventricular tachycardia ([place]) 03/29/2017  .  Other hyperlipidemia 03/29/2017  . Pulmonary nodules 06/12/2016  . S/P AVR (aortic valve replacement) 03/20/2016  . Syncope 03/29/2017  . Syncope and collapse 02/01/2016  . Typical atrial flutter ([place]) 02/01/2016    Past Surgical History:  Procedure Laterality Date  . CORONARY ARTERY BYPASS GRAFT    . HERNIA REPAIR    . MECHANICAL AORTIC VALVE REPLACEMENT    . VASECTOMY       Current Medications: No outpatient prescriptions have been marked as taking for the 08/03/17 encounter (Appointment) with Richardo Priest, MD.     Allergies:   Amoxicillin-pot clavulanate and Tape   Social History   Social History  . Marital status: N/A    Spouse name: N/A  . Number of children: N/A  . Years of education: N/A   Social History Main Topics  . Smoking status: Current Every Day Smoker  . Smokeless tobacco: Never Used  . Alcohol use Yes  . Drug use: Unknown  . Sexual activity: Not on file   Other Topics Concern  . Not on file   Social History Narrative  . No narrative on file     Family History: The patient's ***Family history is unknown by patient. ROS:   Please see the history of present illness.    All other systems reviewed and are negative.  EKGs/Labs/Other Studies Reviewed:    The following studies were reviewed today:  EKG:  EKG ordered today.  The ekg ordered today demonstrates ***  Recent Labs: No results found for requested labs within last 8760 hours.  Recent Lipid Panel No results found for: CHOL, TRIG, HDL, CHOLHDL, VLDL, LDLCALC, LDLDIRECT  Physical Exam:    VS:  There were no vitals taken for this visit.    Wt Readings from Last 3 Encounters:  No data found for Wt     GEN: *** Well nourished, well developed in no acute distress HEENT: Normal NECK: No JVD; No carotid bruits LYMPHATICS: No lymphadenopathy CARDIAC: ***RRR, no murmurs, rubs, gallops RESPIRATORY:  Clear to auscultation without rales, wheezing or rhonchi  ABDOMEN: Soft, non-tender, non-distended MUSCULOSKELETAL:  No edema; No deformity  SKIN: Warm and dry NEUROLOGIC:  Alert and oriented x 3 PSYCHIATRIC:  Normal affect    Signed, Shirlee More, MD  08/03/2017 8:09 AM    [place]

## 2017-08-09 DIAGNOSIS — J90 Pleural effusion, not elsewhere classified: Secondary | ICD-10-CM

## 2017-08-09 DIAGNOSIS — I272 Pulmonary hypertension, unspecified: Secondary | ICD-10-CM

## 2017-08-09 HISTORY — DX: Pleural effusion, not elsewhere classified: J90

## 2017-08-09 HISTORY — DX: Pulmonary hypertension, unspecified: I27.20

## 2017-08-13 DIAGNOSIS — I38 Endocarditis, valve unspecified: Secondary | ICD-10-CM | POA: Diagnosis not present

## 2017-08-13 DIAGNOSIS — I4891 Unspecified atrial fibrillation: Secondary | ICD-10-CM | POA: Diagnosis not present

## 2017-08-14 DIAGNOSIS — C762 Malignant neoplasm of abdomen: Secondary | ICD-10-CM | POA: Diagnosis not present

## 2017-08-14 DIAGNOSIS — Z87440 Personal history of urinary (tract) infections: Secondary | ICD-10-CM | POA: Diagnosis not present

## 2017-08-14 DIAGNOSIS — Z48815 Encounter for surgical aftercare following surgery on the digestive system: Secondary | ICD-10-CM | POA: Diagnosis not present

## 2017-08-14 DIAGNOSIS — Z952 Presence of prosthetic heart valve: Secondary | ICD-10-CM | POA: Diagnosis not present

## 2017-08-14 DIAGNOSIS — Z87891 Personal history of nicotine dependence: Secondary | ICD-10-CM | POA: Diagnosis not present

## 2017-08-14 DIAGNOSIS — I2510 Atherosclerotic heart disease of native coronary artery without angina pectoris: Secondary | ICD-10-CM | POA: Diagnosis not present

## 2017-08-14 DIAGNOSIS — I272 Pulmonary hypertension, unspecified: Secondary | ICD-10-CM | POA: Diagnosis not present

## 2017-08-14 DIAGNOSIS — I110 Hypertensive heart disease with heart failure: Secondary | ICD-10-CM | POA: Diagnosis not present

## 2017-08-14 DIAGNOSIS — Z7982 Long term (current) use of aspirin: Secondary | ICD-10-CM | POA: Diagnosis not present

## 2017-08-14 DIAGNOSIS — Z7901 Long term (current) use of anticoagulants: Secondary | ICD-10-CM | POA: Diagnosis not present

## 2017-08-14 DIAGNOSIS — I509 Heart failure, unspecified: Secondary | ICD-10-CM | POA: Diagnosis not present

## 2017-08-14 DIAGNOSIS — I482 Chronic atrial fibrillation: Secondary | ICD-10-CM | POA: Diagnosis not present

## 2017-08-14 DIAGNOSIS — J449 Chronic obstructive pulmonary disease, unspecified: Secondary | ICD-10-CM | POA: Diagnosis not present

## 2017-08-14 DIAGNOSIS — Z8701 Personal history of pneumonia (recurrent): Secondary | ICD-10-CM | POA: Diagnosis not present

## 2017-08-14 DIAGNOSIS — Z7951 Long term (current) use of inhaled steroids: Secondary | ICD-10-CM | POA: Diagnosis not present

## 2017-08-15 ENCOUNTER — Ambulatory Visit: Payer: Self-pay | Admitting: Cardiology

## 2017-08-15 DIAGNOSIS — I482 Chronic atrial fibrillation: Secondary | ICD-10-CM | POA: Diagnosis not present

## 2017-08-15 DIAGNOSIS — Z48815 Encounter for surgical aftercare following surgery on the digestive system: Secondary | ICD-10-CM | POA: Diagnosis not present

## 2017-08-15 DIAGNOSIS — I2510 Atherosclerotic heart disease of native coronary artery without angina pectoris: Secondary | ICD-10-CM | POA: Diagnosis not present

## 2017-08-15 DIAGNOSIS — J449 Chronic obstructive pulmonary disease, unspecified: Secondary | ICD-10-CM | POA: Diagnosis not present

## 2017-08-15 DIAGNOSIS — I110 Hypertensive heart disease with heart failure: Secondary | ICD-10-CM | POA: Diagnosis not present

## 2017-08-15 DIAGNOSIS — I509 Heart failure, unspecified: Secondary | ICD-10-CM | POA: Diagnosis not present

## 2017-08-17 DIAGNOSIS — I272 Pulmonary hypertension, unspecified: Secondary | ICD-10-CM | POA: Diagnosis not present

## 2017-08-17 DIAGNOSIS — I482 Chronic atrial fibrillation: Secondary | ICD-10-CM | POA: Diagnosis not present

## 2017-08-17 DIAGNOSIS — J432 Centrilobular emphysema: Secondary | ICD-10-CM | POA: Diagnosis not present

## 2017-08-17 DIAGNOSIS — Z5181 Encounter for therapeutic drug level monitoring: Secondary | ICD-10-CM | POA: Diagnosis not present

## 2017-08-17 DIAGNOSIS — Z7901 Long term (current) use of anticoagulants: Secondary | ICD-10-CM | POA: Diagnosis not present

## 2017-08-17 DIAGNOSIS — I10 Essential (primary) hypertension: Secondary | ICD-10-CM | POA: Diagnosis not present

## 2017-08-17 DIAGNOSIS — Z952 Presence of prosthetic heart valve: Secondary | ICD-10-CM | POA: Diagnosis not present

## 2017-08-17 DIAGNOSIS — R600 Localized edema: Secondary | ICD-10-CM | POA: Diagnosis not present

## 2017-08-21 DIAGNOSIS — I482 Chronic atrial fibrillation: Secondary | ICD-10-CM | POA: Diagnosis not present

## 2017-08-24 DIAGNOSIS — L720 Epidermal cyst: Secondary | ICD-10-CM

## 2017-08-24 HISTORY — DX: Epidermal cyst: L72.0

## 2017-08-26 DIAGNOSIS — R21 Rash and other nonspecific skin eruption: Secondary | ICD-10-CM | POA: Diagnosis not present

## 2017-08-26 DIAGNOSIS — Z7901 Long term (current) use of anticoagulants: Secondary | ICD-10-CM | POA: Diagnosis not present

## 2017-08-26 DIAGNOSIS — R233 Spontaneous ecchymoses: Secondary | ICD-10-CM | POA: Diagnosis not present

## 2017-08-28 DIAGNOSIS — Z952 Presence of prosthetic heart valve: Secondary | ICD-10-CM | POA: Diagnosis not present

## 2017-09-03 DIAGNOSIS — J432 Centrilobular emphysema: Secondary | ICD-10-CM | POA: Diagnosis not present

## 2017-09-03 DIAGNOSIS — J90 Pleural effusion, not elsewhere classified: Secondary | ICD-10-CM | POA: Diagnosis not present

## 2017-09-03 DIAGNOSIS — Z952 Presence of prosthetic heart valve: Secondary | ICD-10-CM | POA: Diagnosis not present

## 2017-09-03 DIAGNOSIS — Z7901 Long term (current) use of anticoagulants: Secondary | ICD-10-CM | POA: Diagnosis not present

## 2017-09-03 DIAGNOSIS — R21 Rash and other nonspecific skin eruption: Secondary | ICD-10-CM

## 2017-09-03 DIAGNOSIS — I482 Chronic atrial fibrillation: Secondary | ICD-10-CM | POA: Diagnosis not present

## 2017-09-03 DIAGNOSIS — R918 Other nonspecific abnormal finding of lung field: Secondary | ICD-10-CM | POA: Diagnosis not present

## 2017-09-03 DIAGNOSIS — I10 Essential (primary) hypertension: Secondary | ICD-10-CM | POA: Diagnosis not present

## 2017-09-03 DIAGNOSIS — I272 Pulmonary hypertension, unspecified: Secondary | ICD-10-CM | POA: Diagnosis not present

## 2017-09-03 DIAGNOSIS — Z5181 Encounter for therapeutic drug level monitoring: Secondary | ICD-10-CM | POA: Diagnosis not present

## 2017-09-03 DIAGNOSIS — R600 Localized edema: Secondary | ICD-10-CM | POA: Diagnosis not present

## 2017-09-03 HISTORY — DX: Rash and other nonspecific skin eruption: R21

## 2017-09-05 DIAGNOSIS — R21 Rash and other nonspecific skin eruption: Secondary | ICD-10-CM | POA: Diagnosis not present

## 2017-09-05 DIAGNOSIS — M310 Hypersensitivity angiitis: Secondary | ICD-10-CM | POA: Diagnosis not present

## 2017-09-05 DIAGNOSIS — Z4800 Encounter for change or removal of nonsurgical wound dressing: Secondary | ICD-10-CM | POA: Diagnosis not present

## 2017-09-05 DIAGNOSIS — L723 Sebaceous cyst: Secondary | ICD-10-CM | POA: Diagnosis not present

## 2017-09-10 DIAGNOSIS — I2699 Other pulmonary embolism without acute cor pulmonale: Secondary | ICD-10-CM | POA: Diagnosis not present

## 2017-09-13 DIAGNOSIS — L959 Vasculitis limited to the skin, unspecified: Secondary | ICD-10-CM | POA: Diagnosis not present

## 2017-09-13 DIAGNOSIS — L308 Other specified dermatitis: Secondary | ICD-10-CM | POA: Diagnosis not present

## 2017-09-13 DIAGNOSIS — R21 Rash and other nonspecific skin eruption: Secondary | ICD-10-CM | POA: Diagnosis not present

## 2017-09-14 DIAGNOSIS — L959 Vasculitis limited to the skin, unspecified: Secondary | ICD-10-CM | POA: Diagnosis not present

## 2017-09-18 DIAGNOSIS — I2699 Other pulmonary embolism without acute cor pulmonale: Secondary | ICD-10-CM | POA: Diagnosis not present

## 2017-09-24 DIAGNOSIS — L309 Dermatitis, unspecified: Secondary | ICD-10-CM | POA: Diagnosis not present

## 2017-09-27 DIAGNOSIS — I2699 Other pulmonary embolism without acute cor pulmonale: Secondary | ICD-10-CM | POA: Diagnosis not present

## 2017-09-28 DIAGNOSIS — I2699 Other pulmonary embolism without acute cor pulmonale: Secondary | ICD-10-CM | POA: Diagnosis not present

## 2017-10-01 DIAGNOSIS — R21 Rash and other nonspecific skin eruption: Secondary | ICD-10-CM | POA: Diagnosis not present

## 2017-10-01 DIAGNOSIS — I481 Persistent atrial fibrillation: Secondary | ICD-10-CM | POA: Diagnosis not present

## 2017-10-01 DIAGNOSIS — Z5181 Encounter for therapeutic drug level monitoring: Secondary | ICD-10-CM | POA: Diagnosis not present

## 2017-10-01 DIAGNOSIS — Z952 Presence of prosthetic heart valve: Secondary | ICD-10-CM | POA: Diagnosis not present

## 2017-10-01 DIAGNOSIS — Z7901 Long term (current) use of anticoagulants: Secondary | ICD-10-CM | POA: Diagnosis not present

## 2017-10-01 DIAGNOSIS — M310 Hypersensitivity angiitis: Secondary | ICD-10-CM

## 2017-10-01 DIAGNOSIS — I10 Essential (primary) hypertension: Secondary | ICD-10-CM | POA: Diagnosis not present

## 2017-10-01 DIAGNOSIS — R918 Other nonspecific abnormal finding of lung field: Secondary | ICD-10-CM | POA: Diagnosis not present

## 2017-10-01 DIAGNOSIS — J432 Centrilobular emphysema: Secondary | ICD-10-CM | POA: Diagnosis not present

## 2017-10-01 DIAGNOSIS — I482 Chronic atrial fibrillation: Secondary | ICD-10-CM | POA: Diagnosis not present

## 2017-10-01 DIAGNOSIS — R600 Localized edema: Secondary | ICD-10-CM | POA: Diagnosis not present

## 2017-10-01 DIAGNOSIS — I272 Pulmonary hypertension, unspecified: Secondary | ICD-10-CM | POA: Diagnosis not present

## 2017-10-01 DIAGNOSIS — R002 Palpitations: Secondary | ICD-10-CM | POA: Insufficient documentation

## 2017-10-01 DIAGNOSIS — J90 Pleural effusion, not elsewhere classified: Secondary | ICD-10-CM | POA: Diagnosis not present

## 2017-10-01 DIAGNOSIS — R0609 Other forms of dyspnea: Secondary | ICD-10-CM | POA: Diagnosis not present

## 2017-10-01 HISTORY — DX: Hypersensitivity angiitis: M31.0

## 2017-10-01 HISTORY — DX: Palpitations: R00.2

## 2017-10-03 DIAGNOSIS — N200 Calculus of kidney: Secondary | ICD-10-CM | POA: Diagnosis not present

## 2017-10-03 DIAGNOSIS — N411 Chronic prostatitis: Secondary | ICD-10-CM | POA: Diagnosis not present

## 2017-10-03 DIAGNOSIS — D3001 Benign neoplasm of right kidney: Secondary | ICD-10-CM | POA: Diagnosis not present

## 2017-10-04 DIAGNOSIS — I2699 Other pulmonary embolism without acute cor pulmonale: Secondary | ICD-10-CM | POA: Diagnosis not present

## 2017-10-10 DIAGNOSIS — I2699 Other pulmonary embolism without acute cor pulmonale: Secondary | ICD-10-CM | POA: Diagnosis not present

## 2017-10-12 DIAGNOSIS — R0600 Dyspnea, unspecified: Secondary | ICD-10-CM | POA: Diagnosis not present

## 2017-10-12 DIAGNOSIS — I272 Pulmonary hypertension, unspecified: Secondary | ICD-10-CM | POA: Diagnosis not present

## 2017-10-12 DIAGNOSIS — J4540 Moderate persistent asthma, uncomplicated: Secondary | ICD-10-CM | POA: Diagnosis not present

## 2017-10-12 DIAGNOSIS — J432 Centrilobular emphysema: Secondary | ICD-10-CM | POA: Diagnosis not present

## 2017-10-15 DIAGNOSIS — I481 Persistent atrial fibrillation: Secondary | ICD-10-CM | POA: Diagnosis not present

## 2017-10-20 DIAGNOSIS — I481 Persistent atrial fibrillation: Secondary | ICD-10-CM | POA: Diagnosis not present

## 2017-10-22 DIAGNOSIS — R0609 Other forms of dyspnea: Secondary | ICD-10-CM | POA: Diagnosis not present

## 2017-10-31 DIAGNOSIS — R2243 Localized swelling, mass and lump, lower limb, bilateral: Secondary | ICD-10-CM | POA: Diagnosis not present

## 2017-10-31 DIAGNOSIS — J90 Pleural effusion, not elsewhere classified: Secondary | ICD-10-CM | POA: Diagnosis not present

## 2017-10-31 DIAGNOSIS — R0600 Dyspnea, unspecified: Secondary | ICD-10-CM | POA: Diagnosis not present

## 2017-10-31 DIAGNOSIS — I272 Pulmonary hypertension, unspecified: Secondary | ICD-10-CM | POA: Diagnosis not present

## 2017-11-06 DIAGNOSIS — I10 Essential (primary) hypertension: Secondary | ICD-10-CM | POA: Diagnosis not present

## 2017-11-06 DIAGNOSIS — Z952 Presence of prosthetic heart valve: Secondary | ICD-10-CM | POA: Diagnosis not present

## 2017-11-06 DIAGNOSIS — R918 Other nonspecific abnormal finding of lung field: Secondary | ICD-10-CM | POA: Diagnosis not present

## 2017-11-06 DIAGNOSIS — J432 Centrilobular emphysema: Secondary | ICD-10-CM | POA: Diagnosis not present

## 2017-11-06 DIAGNOSIS — I482 Chronic atrial fibrillation: Secondary | ICD-10-CM | POA: Diagnosis not present

## 2017-11-06 DIAGNOSIS — Z7901 Long term (current) use of anticoagulants: Secondary | ICD-10-CM | POA: Diagnosis not present

## 2017-11-06 DIAGNOSIS — I7389 Other specified peripheral vascular diseases: Secondary | ICD-10-CM | POA: Diagnosis not present

## 2017-11-06 DIAGNOSIS — I50812 Chronic right heart failure: Secondary | ICD-10-CM | POA: Diagnosis not present

## 2017-11-06 DIAGNOSIS — I272 Pulmonary hypertension, unspecified: Secondary | ICD-10-CM | POA: Diagnosis not present

## 2017-11-06 DIAGNOSIS — Z5181 Encounter for therapeutic drug level monitoring: Secondary | ICD-10-CM | POA: Diagnosis not present

## 2017-11-08 DIAGNOSIS — R0600 Dyspnea, unspecified: Secondary | ICD-10-CM | POA: Diagnosis not present

## 2017-11-08 DIAGNOSIS — I272 Pulmonary hypertension, unspecified: Secondary | ICD-10-CM | POA: Diagnosis not present

## 2017-11-09 DIAGNOSIS — I2699 Other pulmonary embolism without acute cor pulmonale: Secondary | ICD-10-CM | POA: Diagnosis not present

## 2017-11-09 DIAGNOSIS — Z23 Encounter for immunization: Secondary | ICD-10-CM | POA: Diagnosis not present

## 2017-11-14 DIAGNOSIS — I2699 Other pulmonary embolism without acute cor pulmonale: Secondary | ICD-10-CM | POA: Diagnosis not present

## 2017-11-21 DIAGNOSIS — I2699 Other pulmonary embolism without acute cor pulmonale: Secondary | ICD-10-CM | POA: Diagnosis not present

## 2017-11-26 DIAGNOSIS — Z5181 Encounter for therapeutic drug level monitoring: Secondary | ICD-10-CM | POA: Diagnosis not present

## 2017-11-26 DIAGNOSIS — I10 Essential (primary) hypertension: Secondary | ICD-10-CM | POA: Diagnosis not present

## 2017-11-26 DIAGNOSIS — J90 Pleural effusion, not elsewhere classified: Secondary | ICD-10-CM | POA: Diagnosis not present

## 2017-11-26 DIAGNOSIS — Z7901 Long term (current) use of anticoagulants: Secondary | ICD-10-CM | POA: Diagnosis not present

## 2017-11-26 DIAGNOSIS — R918 Other nonspecific abnormal finding of lung field: Secondary | ICD-10-CM | POA: Diagnosis not present

## 2017-11-26 DIAGNOSIS — Z952 Presence of prosthetic heart valve: Secondary | ICD-10-CM | POA: Diagnosis not present

## 2017-11-26 DIAGNOSIS — I482 Chronic atrial fibrillation: Secondary | ICD-10-CM | POA: Diagnosis not present

## 2017-11-26 DIAGNOSIS — I272 Pulmonary hypertension, unspecified: Secondary | ICD-10-CM | POA: Diagnosis not present

## 2017-11-26 DIAGNOSIS — I8393 Asymptomatic varicose veins of bilateral lower extremities: Secondary | ICD-10-CM | POA: Diagnosis not present

## 2017-11-26 DIAGNOSIS — R600 Localized edema: Secondary | ICD-10-CM | POA: Diagnosis not present

## 2017-12-06 DIAGNOSIS — R600 Localized edema: Secondary | ICD-10-CM | POA: Diagnosis not present

## 2017-12-06 DIAGNOSIS — I8393 Asymptomatic varicose veins of bilateral lower extremities: Secondary | ICD-10-CM | POA: Diagnosis not present

## 2017-12-11 DIAGNOSIS — M310 Hypersensitivity angiitis: Secondary | ICD-10-CM | POA: Diagnosis not present

## 2017-12-11 DIAGNOSIS — R002 Palpitations: Secondary | ICD-10-CM | POA: Diagnosis not present

## 2017-12-11 DIAGNOSIS — Z952 Presence of prosthetic heart valve: Secondary | ICD-10-CM | POA: Diagnosis not present

## 2017-12-11 DIAGNOSIS — R21 Rash and other nonspecific skin eruption: Secondary | ICD-10-CM | POA: Diagnosis not present

## 2017-12-11 DIAGNOSIS — I482 Chronic atrial fibrillation: Secondary | ICD-10-CM | POA: Diagnosis not present

## 2017-12-11 DIAGNOSIS — I272 Pulmonary hypertension, unspecified: Secondary | ICD-10-CM | POA: Diagnosis not present

## 2017-12-11 DIAGNOSIS — R600 Localized edema: Secondary | ICD-10-CM | POA: Diagnosis not present

## 2017-12-11 DIAGNOSIS — Z7901 Long term (current) use of anticoagulants: Secondary | ICD-10-CM | POA: Diagnosis not present

## 2017-12-11 DIAGNOSIS — I10 Essential (primary) hypertension: Secondary | ICD-10-CM | POA: Diagnosis not present

## 2017-12-11 DIAGNOSIS — K851 Biliary acute pancreatitis without necrosis or infection: Secondary | ICD-10-CM | POA: Diagnosis not present

## 2017-12-11 DIAGNOSIS — Z5181 Encounter for therapeutic drug level monitoring: Secondary | ICD-10-CM | POA: Diagnosis not present

## 2017-12-11 DIAGNOSIS — J432 Centrilobular emphysema: Secondary | ICD-10-CM | POA: Diagnosis not present

## 2017-12-12 DIAGNOSIS — I2699 Other pulmonary embolism without acute cor pulmonale: Secondary | ICD-10-CM | POA: Diagnosis not present

## 2017-12-14 DIAGNOSIS — R600 Localized edema: Secondary | ICD-10-CM | POA: Diagnosis not present

## 2017-12-19 DIAGNOSIS — I2699 Other pulmonary embolism without acute cor pulmonale: Secondary | ICD-10-CM | POA: Diagnosis not present

## 2018-01-04 DIAGNOSIS — Z7901 Long term (current) use of anticoagulants: Secondary | ICD-10-CM | POA: Insufficient documentation

## 2018-01-04 DIAGNOSIS — R600 Localized edema: Secondary | ICD-10-CM | POA: Insufficient documentation

## 2018-01-04 HISTORY — DX: Long term (current) use of anticoagulants: Z79.01

## 2018-01-04 HISTORY — DX: Localized edema: R60.0

## 2018-01-08 ENCOUNTER — Encounter: Payer: Self-pay | Admitting: Cardiology

## 2018-01-08 NOTE — Progress Notes (Signed)
Cardiology Office Note:    Date:  01/09/2018   ID:  Lance Hicks, DOB 08/11/1946, MRN [id number]  PCP:  Townsend Roger, MD  Cardiologist:  Shirlee More, MD    Referring MD: No ref. provider found    ASSESSMENT:    1. Atrial flutter, unspecified type ([place])   2. Anticoagulated on Coumadin   3. Hx of CABG   4. H/O maze procedure   5. S/P AVR (aortic valve replacement)   6. Coronary artery disease involving native coronary artery of native heart with angina pectoris ([place])   7. Hypertensive heart disease with chronic diastolic congestive heart failure ([place])   8. PAH (pulmonary artery hypertension) ([place])   9. 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 Stable rate controlled continue anticoagulation goal INR is 2.5 to 3 with atrial fibrillation and mechanical AVR.  At this time I do not think he would benefit from more EP ablative procedures and of symptomatic bradycardia were to recur would benefit from AV nodal ablation and pacemaker. 2. Continue warfarin 3. Stable 4. Ineffective in resuming sinus rhythm 5. Stable function 6. Stable continue current medical therapy 7. Clinically his biggest problem right now is that he is developed distal portion of the right heart failure associated with pulmonary artery hypertension and moderate to severe tricuspid regurgitation.  In the end I think you require a hemodynamic cath high quality echocardiogram and perhaps cardiac MR.  At his request to be referred to the heart failure program at [place].  In the interim he will do weight-based diuretic furosemide 20 mg daily taking it in between days if his weight rises 3 pounds and will continue to sodium restrict 8. Clinically I suspect he has developed pulmonary hypertension and requires advanced heart failure modalities 9. See above   Next appointment: 6 weeks   Medication Adjustments/Labs and Tests  Ordered: Current medicines are reviewed at length with the patient today.  Concerns regarding medicines are outlined above.  Orders Placed This Encounter  Procedures  . Basic Metabolic Panel (BMET)  . B Nat Peptide  . AMB referral to CHF clinic   Meds ordered this encounter  Medications  . furosemide (LASIX) 40 MG tablet    Sig: Take 0.5 tablets (20 mg total) by mouth every other day. Take on the off day if you weight increases 3 lbs    Dispense:  30 tablet    Refill:  3    Chief Complaint  Patient presents with  . Follow-up    History of Present Illness:    Lance Hicks is a 72 y.o. male with a hx of CAD, Atrial Fibrillation with Maze procedure and LAA excision, Dyslipidemia, HTN, S/P CABG, Valvular heart disease with #25 Carbomedics AVR in 2010, Syncope with nonsustained VT, COPD, pulmonary fibrosis and nodule  last seen in April 2018. Compliance with diet, lifestyle and medications: Yes He did well until he had a stormy hospital course with pancreatitis cholecystitis surgery and reoperation.  He developed a severe diffuse rash at that time he shows me pictures and it looks like vasculitis.  Since then he has never recovered he is lost over 30 pounds and despite increased increase his caloric intake is unable to gain weight.  He is developed heart failure and is very sensitive to diuretics developing symptomatic hypotension.  He is able to do physical activities he can exercise on a  treadmill but he attempts to ride a bike or bend over he finds himself very breathless.  He has had no chest pain orthopnea PND palpitation or syncope.  His rhythm continues to be chronic atrial flutter.  He has failed previous ablation.  His diuretics have been markedly decreased by the patient he stopped taking metolazone and on his own is reduced his furosemide to 20 mg every other day.  Venous lower extremity study showed venous incompetence but also showed an engorged inferior vena cava and high venous  pressures echocardiogram showed normal prosthetic valve function pulmonary hypertension and moderate to severe tricuspid regurgitation.  In November he had a lung scan which showed low probability of pulmonary embolism with relatively homogeneous perfusion of both lungs.  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 In light of concurrent left nephrolithiasis, will check CT renal colic next year instead of renal US.   Lance Hicks Anticoagulated on Coumadin 01/04/2018  . Anxiety 05/10/2016   Last Assessment & Plan:  Doing well off of zoloft.  . Asymptomatic microscopic hematuria 06/20/2017   Last Assessment & Plan:  Had hematuria workup in [facility] in [facility] which negative CT and cystoscopy. UA with 2+ blood last visit - we discussed recommendation for repeat workup at 5 years or if degree of hematuria progresses.   . Atrial fibrillation ([place]) 03/29/2017  . Atrial flutter ([place]) 03/29/2017  . Chronic allergic rhinitis 04/28/2016   Last Assessment & Plan:  Continue astelin  . Chronic anticoagulation 03/29/2017  . Chronic atrial fibrillation ([place]) 07/23/2015   Last Assessment & Plan:  Coumadin and metoprolol, cardiology referral to establish care.  . Chronic midline back pain 12/14/2015   Last Assessment & Plan:  Pain management referral for further evaluation.  . Chronic prostatitis 07/23/2015   Last Assessment & Plan:  Has largely resolved since stopping bike riding. Recommend annual DRE AND PSA - will see back 12/2015 for annual screening, given 1st degree fhx. To call office for recurrent prostatitis symptoms.   Lance Hicks COPD (chronic obstructive pulmonary disease) ([place]) 06/12/2016  . Coronary artery disease involving native coronary artery of  native heart with angina pectoris ([place]) 03/29/2017  . Cough 10/17/2016   Last Assessment & Plan:  Discussed typical course for acute viral illness. If symptoms worsen or fail to improve by 7-10d, delayed ATBs, fluids, rest, NSAIDs/APAP prn. Seek care if not improving. Needs earlier INR check due to ATBs.  Lance Hicks Dyspnea 02/01/2016   Last Assessment & Plan:  Overall improving, eval by pulm, plan for CT, neg stress test with cardiology. Recent switch to carvedilol due to side effects.  . Epidermoid cyst of skin 08/24/2017  . Essential hypertension 12/14/2015   Last Assessment & Plan:  Hypertension control: controlled  Medications: compliant Medication Management: as noted in orders Home blood pressure monitoring recommended additionally as needed for symptoms  The patient's care plan was reviewed and updated. Instructions and counseling were provided regarding patient goals and barriers. He was counseled to adopt a healthy lifestyle. Educational resources and self-management tools have been provided as charted in [facility] list.   . H/O maze procedure 03/29/2017  . [name] 03/29/2017   Overview:  2011  .  Hx of CABG 03/29/2017  . Hyperlipidemia 03/29/2017  . Hypertensive heart disease 03/29/2017  . Kidney stones 07/23/2015   Overview:  x 3  Last Assessment & Plan:  By [place] has left nephrolithiasis, but not visible by KUB. Will check CT renal colic next year to assess both stone burden as well as to surveil AML.   . Leukocytoclastic vasculitis ([place]) 10/01/2017  . Localized edema 01/04/2018  . Lumbar radicular pain 01/19/2016  . Maculopapular rash 09/03/2017  . Nephrolithiasis 07/23/2015   Overview:  x 3  Last Assessment & Plan:  By [place] has left nephrolithiasis, but not visible by KUB. Will check CT renal colic next year to assess both stone burden as well as to surveil AML.   Overview:  x 3  Last Assessment & Plan:  Has 26m nonobstructing LUP stone - not visible by KUB.  Will check renal [place] - he  will contact office sooner if symptomatic.   . Non-sustained ventricular tachycardia ([place] 03/29/2017  . Other hyperlipidemia 03/29/2017  . Palpitations 10/01/2017  . Paroxysmal atrial fibrillation ([place] 03/29/2017  . Paroxysmal atrial fibrillation ([place] 03/29/2017  . Pleural effusion, bilateral 08/09/2017  . Post-nasal drainage 02/25/2016   Last Assessment & Plan:  Trial zyrtec and flonase  . Prostate cancer screening 06/20/2017   Last Assessment & Plan:  Recommend continued annual CaP screening until within 10 years of life expectancy. Given good health and fhx of longevity, would anticipate CaP screening to continue until age 72  PSA today and again in one year on day of visit.  . Pulmonary hypertension ([place] 08/09/2017  . Pulmonary nodules 06/12/2016  . S/P AVR (aortic valve replacement) 03/20/2016  . Supratherapeutic INR 07/26/2017  . Syncope 03/29/2017  . Syncope and collapse 02/01/2016  . Typical atrial flutter ([place] 02/01/2016    Past Surgical History:  Procedure Laterality Date  . CHOLECYSTECTOMY    . CORONARY ARTERY BYPASS GRAFT    . EXPLORATORY LAPAROTOMY  07/30/2017  . FOOT SURGERY    . FRACTURE SURGERY Right    wrist and forearm  . HERNIA REPAIR    . MECHANICAL AORTIC VALVE REPLACEMENT    . NASAL SINUS SURGERY    . VASECTOMY      Current Medications: Current Meds  Medication Sig  . aspirin 81 MG chewable tablet Chew 1 tablet by mouth every morning.  . budesonide (PULMICORT) 0.5 MG/2ML nebulizer solution daily as needed.  . cetirizine (ZYRTEC ALLERGY) 10 MG tablet Take 10 mg by mouth daily.  . furosemide (LASIX) 40 MG tablet Take 0.5 tablets (20 mg total) by mouth every other day. Take on the off day if you weight increases 3 lbs  . metoprolol tartrate (LOPRESSOR) 25 MG tablet Take 1 tablet by mouth 2 (two) times daily.  . Multiple Vitamins-Minerals (MULTIVITAMIN ADULTS PO) Take 1 tablet by mouth daily.  . TUDORZA PRESSAIR 400 MCG/ACT AEPB as needed.  . warfarin (COUMADIN) 2.5 MG  tablet Take 1 tablet by mouth 6 days.  .Lance Kitchenwarfarin (COUMADIN) 5 MG tablet Take 1 tablet by mouth once a week.  . [DISCONTINUED] furosemide (LASIX) 40 MG tablet Take 40 mg by mouth every other day.     Allergies:   Amoxicillin-pot clavulanate and Tape   Social History   Socioeconomic History  . Marital status: Unknown    Spouse name: None  . Number of children: None  . Years of education: None  . Highest education level: None  Social Needs  . FEmergency planning/management officer  strain: None  . Food insecurity - worry: None  . Food insecurity - inability: None  . Transportation needs - medical: None  . Transportation needs - non-medical: None  Occupational History  . None  Tobacco Use  . Smoking status: Former Smoker    Packs/day: 2.00    Years: 34.00    Pack years: 68.00    Types: Cigarettes    Last attempt to quit: 07/16/2000    Years since quitting: 17.4  . Smokeless tobacco: Never Used  Substance and Sexual Activity  . Alcohol use: Yes  . Drug use: No  . Sexual activity: None  Other Topics Concern  . None  Social History Narrative  . None     Family History: The patient's family history includes Arthritis in his mother; Asthma in his mother; Heart attack in his father; Hypertension in his father; Stroke in his paternal grandmother. ROS:   Please see the history of present illness.    All other systems reviewed and are negative.  EKGs/Labs/Other Studies Reviewed:    The following studies were reviewed today: His warfarin is managed by his PCP and he said recent INR and labs performed Echo TTE: Echo 08/09/2017: 1. Left ventricle septal thickness is mildly increased. 2. The left ventricle mass index and the relative wall thickness values indicate concentric hypertrophy. 3. The ejection fraction is estimated to be 55%. 4. There is septal flattening consistent with right ventricular pressure and or volume overload. 5. Left ventricular diastolic function is indeterminate on current  study. 6. A mechanical prosthetic aortic valve is present. 7. Mild aortic regurgitation is present. 8. There is no evidence of aortic stenosis. 9. There is mild mitral regurgitation observed. 10. There is moderate to severe tricuspid regurgitation. 11. There is a trivial amount of pulmonic regurgitation. 12. The right ventricular systolic pressure is calculated at 65-67mHg. 13. The inferior vena cava is dilated with <50% inspiratory collapse, suggestive of an elevated right atrial pressure (153mg).  Recent Labs: No results found for requested labs within last 8760 hours.  Recent Lipid Panel No results found for: CHOL, TRIG, HDL, CHOLHDL, VLDL, LDLCALC, LDLDIRECT  Physical Exam:    VS:  BP 132/84 (BP Location: Right Arm, Patient Position: Sitting, Cuff Size: Large)   Pulse 96   Ht _0  (1.753 m)   Wt 155 lb (70.3 kg)   SpO2 95%   BMI 22.89 kg/m     Wt Readings from Last 3 Encounters:  01/09/18 155 lb (70.3 kg)     GEN: He has a dramatic change in appearance he has a butterfly rash on his face engorgement of his ears and has developed marked peripheral edema and his weight is down 30 pounds since his last visit  in no acute distress HEENT: Normal NECK: Marked neck vein distention has not a pattern of jugular reflux JVD; No carotid bruits LYMPHATICS: No lymphadenopathy CARDIAC: Irregular irregular rhythm S1 soft I think P2 is accentuated sharp closing sound of his mechanical AVR.  Grade 2/6 murmur of tricuspid regurgitation left sternal border rating to the right sternal border no aortic regurgitation. RESPIRATORY:  Clear to auscultation without rales, wheezing or rhonchi  ABDOMEN: Soft, non-tender, non-distended liver is distended and pulsatile MUSCULOSKELETAL: Varicosities and 4+ edema to the knees bilaterally.  Edema; No deformity  SKIN: Warm and dry NEUROLOGIC:  Alert and oriented x 3 PSYCHIATRIC:  Normal affect    Signed, BrShirlee MoreMD  01/09/2018 5:03 PM    [facility]

## 2018-01-09 ENCOUNTER — Ambulatory Visit (INDEPENDENT_AMBULATORY_CARE_PROVIDER_SITE_OTHER): Payer: Medicare Other | Admitting: Cardiology

## 2018-01-09 ENCOUNTER — Encounter: Payer: Self-pay | Admitting: Cardiology

## 2018-01-09 VITALS — BP 132/84 | HR 96 | Ht 69.0 in | Wt 155.0 lb

## 2018-01-09 DIAGNOSIS — I50810 Right heart failure, unspecified: Secondary | ICD-10-CM | POA: Diagnosis not present

## 2018-01-09 DIAGNOSIS — Z7901 Long term (current) use of anticoagulants: Secondary | ICD-10-CM

## 2018-01-09 DIAGNOSIS — I110 Hypertensive heart disease with heart failure: Secondary | ICD-10-CM

## 2018-01-09 DIAGNOSIS — I25119 Atherosclerotic heart disease of native coronary artery with unspecified angina pectoris: Secondary | ICD-10-CM | POA: Diagnosis not present

## 2018-01-09 DIAGNOSIS — I4892 Unspecified atrial flutter: Secondary | ICD-10-CM | POA: Diagnosis not present

## 2018-01-09 DIAGNOSIS — Z952 Presence of prosthetic heart valve: Secondary | ICD-10-CM

## 2018-01-09 DIAGNOSIS — Z5181 Encounter for therapeutic drug level monitoring: Secondary | ICD-10-CM

## 2018-01-09 DIAGNOSIS — I5032 Chronic diastolic (congestive) heart failure: Secondary | ICD-10-CM

## 2018-01-09 DIAGNOSIS — I2729 Other secondary pulmonary hypertension: Secondary | ICD-10-CM

## 2018-01-09 DIAGNOSIS — Z951 Presence of aortocoronary bypass graft: Secondary | ICD-10-CM | POA: Diagnosis not present

## 2018-01-09 DIAGNOSIS — Z9889 Other specified postprocedural states: Secondary | ICD-10-CM

## 2018-01-09 DIAGNOSIS — I2721 Secondary pulmonary arterial hypertension: Secondary | ICD-10-CM | POA: Diagnosis not present

## 2018-01-09 MED ORDER — FUROSEMIDE 40 MG PO TABS: 20.0000 mg | ORAL_TABLET | ORAL | 3 refills | Status: DC

## 2018-01-09 NOTE — Patient Instructions (Addendum)
Medication Instructions:  Your physician has recommended you make the following change in your medication:  CHANGE furosemide to take 0.5 tablets (20 mg total) by mouth every other day. Take on the off day if you weight increases 3 lbs, Starting Wed 01/09/2018, Normal  Labwork: Your physician recommends that you have the following labs drawn: BMP and BNP  Testing/Procedures: None  Follow-Up: Your physician recommends that you schedule a follow-up appointment in: 6 weeks  Any Other Special Instructions Will Be Listed Below (If Applicable).  Referral to Dr. Tempie Hoist is made   If you need a refill on your cardiac medications before your next appointment, please call your pharmacy.   [place], RN, BSN  Locations to [employer] Compression Stockings:  [facility] [address] [place]. [place], [place] [address] [phone] (fax) [email or web address] *Mon-Fri 9am-4:30pm* *Closed on Holidays*  Putting on Compression Stockings Turn the stocking inside-out, then fit it over your toes and heel.  Roll the stocking up your leg.  Once stockings are on, make sure the top of the stocking is about two fingers' width below the crease of the knee (or the groin if you wear thigh-high stockings).  Use equipment, such as a stocking donner, or wear rubber gloves to make it easier to put on compression stockings.   Elastic compression stockings are prescribed to treat many vein problems. Wearing them may be the most important thing you do to manage your symptoms. The stockings fit tightly aroundyour ankle, gradually reducing in pressure as they go up your legs. This helps keep blood flowing toyour heart. As a result, swelling is reduced. Your doctor will prescribe stockings at a safe pressure for you. He or she will also tell you how often to wear and remove the stockings. Follow these instructions closely.Also, do not buy or wear compression stockings  without first seeing your doctor. Tips for Wear and Care To wear stockings safely and to get the most benefit:  Wear the length prescribed by your doctor.  Pull them to the designated height and no farther. Don't let them bunch at the top. This can restrict blood flow and increase swelling.  Wear the stockingsfor the amount of time your doctor recommends. Replace them when they start to feel loose. This will likely be every 3 to 6 months.  Remove them as your doctor directs. When removed, wash your legs. Then check your legs and feet for sores. Call your doctor if you find a sore. Don't put the stockings back on unless your doctor directs.  Wash the stockings as instructed. They may need to be handwashed.  Heart Failure  Weigh yourself every morning when you first wake up and record on a calender or note pad, bring this to your office visits. Using a pill tender can help with taking your medications consistently.  Limit your fluid intake to 2 liters daily  Limit your sodium intake to less than 2-3 grams daily. Ask if you need dietary teaching.  If you gain more than 3 pounds (from your dry weight ), double your dose of diuretic for the day.  If you gain more than 5 pounds (from your dry weight), double your dose of lasix and call your heart failure doctor.  Please do not smoke tobacco since it is very bad for your heart.  Please do not drink alcohol since it can worsen your heart failure.Also avoid OTC nonsteroidal drugs, such as advil, aleve and motrin.  Try to  exercise for at least 30 minutes every day because this will help your heart be more efficient. You may be eligible for supervised cardiac rehab, ask your physician.

## 2018-01-10 ENCOUNTER — Telehealth (HOSPITAL_COMMUNITY): Payer: Self-pay | Admitting: Vascular Surgery

## 2018-01-10 LAB — BASIC METABOLIC PANEL
BUN/Creatinine Ratio: 24 (ref 10–24)
BUN: 23 mg/dL (ref 8–27)
CO2: 24 mmol/L (ref 20–29)
Calcium: 9.4 mg/dL (ref 8.6–10.2)
Chloride: 99 mmol/L (ref 96–106)
Creatinine, Ser: 0.97 mg/dL (ref 0.76–1.27)
GFR calc Af Amer: 91 mL/min/{1.73_m2} (ref >59)
GFR calc non Af Amer: 79 mL/min/{1.73_m2} (ref >59)
Glucose: 74 mg/dL (ref 65–99)
Potassium: 3.5 mmol/L (ref 3.5–5.2)
Sodium: 141 mmol/L (ref 134–144)

## 2018-01-10 LAB — BRAIN NATRIURETIC PEPTIDE: BNP: 668 pg/mL — ABNORMAL HIGH (ref 0.0–100.0)

## 2018-01-10 NOTE — Telephone Encounter (Signed)
Left pt message to make NEW PT F/U W/ db Friday 01/11/18  @ 10 or 11

## 2018-01-11 ENCOUNTER — Other Ambulatory Visit (HOSPITAL_COMMUNITY): Payer: Self-pay | Admitting: *Deleted

## 2018-01-11 ENCOUNTER — Ambulatory Visit (HOSPITAL_COMMUNITY)
Admission: RE | Admit: 2018-01-11 | Discharge: 2018-01-11 | Disposition: A | Discharge status: Home / Self Care | Payer: Medicare Other | Source: Ambulatory Visit | Attending: Internal Medicine | Admitting: Internal Medicine

## 2018-01-11 ENCOUNTER — Encounter (HOSPITAL_COMMUNITY): Payer: Self-pay | Admitting: Internal Medicine

## 2018-01-11 ENCOUNTER — Encounter (HOSPITAL_COMMUNITY): Payer: Self-pay | Admitting: *Deleted

## 2018-01-11 VITALS — HR 44 | Ht 69.0 in | Wt 154.0 lb

## 2018-01-11 DIAGNOSIS — Z951 Presence of aortocoronary bypass graft: Secondary | ICD-10-CM | POA: Insufficient documentation

## 2018-01-11 DIAGNOSIS — I482 Chronic atrial fibrillation: Secondary | ICD-10-CM | POA: Insufficient documentation

## 2018-01-11 DIAGNOSIS — Z79899 Other long term (current) drug therapy: Secondary | ICD-10-CM | POA: Diagnosis not present

## 2018-01-11 DIAGNOSIS — I2510 Atherosclerotic heart disease of native coronary artery without angina pectoris: Secondary | ICD-10-CM

## 2018-01-11 DIAGNOSIS — Z8261 Family history of arthritis: Secondary | ICD-10-CM | POA: Diagnosis not present

## 2018-01-11 DIAGNOSIS — Z880 Allergy status to penicillin: Secondary | ICD-10-CM | POA: Diagnosis not present

## 2018-01-11 DIAGNOSIS — I119 Hypertensive heart disease without heart failure: Secondary | ICD-10-CM | POA: Diagnosis not present

## 2018-01-11 DIAGNOSIS — Z87891 Personal history of nicotine dependence: Secondary | ICD-10-CM | POA: Diagnosis not present

## 2018-01-11 DIAGNOSIS — Z9889 Other specified postprocedural states: Secondary | ICD-10-CM | POA: Insufficient documentation

## 2018-01-11 DIAGNOSIS — Z9109 Other allergy status, other than to drugs and biological substances: Secondary | ICD-10-CM | POA: Insufficient documentation

## 2018-01-11 DIAGNOSIS — I4892 Unspecified atrial flutter: Secondary | ICD-10-CM | POA: Diagnosis not present

## 2018-01-11 DIAGNOSIS — I25119 Atherosclerotic heart disease of native coronary artery with unspecified angina pectoris: Secondary | ICD-10-CM | POA: Insufficient documentation

## 2018-01-11 DIAGNOSIS — I272 Pulmonary hypertension, unspecified: Secondary | ICD-10-CM

## 2018-01-11 DIAGNOSIS — E7849 Other hyperlipidemia: Secondary | ICD-10-CM | POA: Insufficient documentation

## 2018-01-11 DIAGNOSIS — Z7982 Long term (current) use of aspirin: Secondary | ICD-10-CM | POA: Diagnosis not present

## 2018-01-11 DIAGNOSIS — J449 Chronic obstructive pulmonary disease, unspecified: Secondary | ICD-10-CM | POA: Diagnosis not present

## 2018-01-11 DIAGNOSIS — Z7901 Long term (current) use of anticoagulants: Secondary | ICD-10-CM | POA: Insufficient documentation

## 2018-01-11 DIAGNOSIS — Z7902 Long term (current) use of antithrombotics/antiplatelets: Secondary | ICD-10-CM | POA: Insufficient documentation

## 2018-01-11 DIAGNOSIS — Z9049 Acquired absence of other specified parts of digestive tract: Secondary | ICD-10-CM | POA: Insufficient documentation

## 2018-01-11 DIAGNOSIS — I480 Paroxysmal atrial fibrillation: Secondary | ICD-10-CM | POA: Insufficient documentation

## 2018-01-11 DIAGNOSIS — F17210 Nicotine dependence, cigarettes, uncomplicated: Secondary | ICD-10-CM | POA: Diagnosis not present

## 2018-01-11 DIAGNOSIS — I2781 Cor pulmonale (chronic): Secondary | ICD-10-CM | POA: Insufficient documentation

## 2018-01-11 DIAGNOSIS — M310 Hypersensitivity angiitis: Secondary | ICD-10-CM | POA: Diagnosis not present

## 2018-01-11 DIAGNOSIS — Z9852 Vasectomy status: Secondary | ICD-10-CM | POA: Insufficient documentation

## 2018-01-11 DIAGNOSIS — Z952 Presence of prosthetic heart valve: Secondary | ICD-10-CM | POA: Insufficient documentation

## 2018-01-11 DIAGNOSIS — Z8249 Family history of ischemic heart disease and other diseases of the circulatory system: Secondary | ICD-10-CM | POA: Insufficient documentation

## 2018-01-11 DIAGNOSIS — Z87442 Personal history of urinary calculi: Secondary | ICD-10-CM | POA: Diagnosis not present

## 2018-01-11 DIAGNOSIS — Z823 Family history of stroke: Secondary | ICD-10-CM | POA: Insufficient documentation

## 2018-01-11 DIAGNOSIS — Z825 Family history of asthma and other chronic lower respiratory diseases: Secondary | ICD-10-CM | POA: Insufficient documentation

## 2018-01-11 MED ORDER — SPIRONOLACTONE 25 MG PO TABS: 12.5000 mg | ORAL_TABLET | Freq: Every day | ORAL | 3 refills | Status: DC

## 2018-01-11 NOTE — Patient Instructions (Signed)
Labs today (will call for abnormal results, otherwise no news is good news)  START taking spironolactone 12.5 mg (0.5 Tablet) Once Daily  Please wear compression hose during the day, may take short rest breaks as needed.   Pulmonary Function test has been ordered for you, we will schedule at checkout.  Cardiac MRI have been ordered for you, we will call you to schedule once insurance has cleared procedure.  Overnight oximetry has been ordered for you, home health will call you for setup.  You have been scheduled for a Right heart Catheterization, please see attached instruction sheet.   Labs in 10 days (bmet)  Follow up in 4 weeks.

## 2018-01-11 NOTE — Progress Notes (Signed)
[name]   Date:  01/11/2018   ID:  Lance Hicks, DOB [birth year], MRN [id number]  PCP:  Lance Roger, MD  Cardiologist:  Dr. 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 Also has COPD (quit smoking in 2002 - 1 ppd x 25 years), HL, HTN referred by Dr. Bettina Hicks for further evaluation of RV failure  He moved from [place] about 2 years ago and has been followed by Pulmonary and Cardiology art [facility]. (I have reviewed these records in [place])  In 8/18 admitted for acute pancreatitis. Underwent lap cholecystectomy on 07/29/2017. On 8/6 hedeveloped hypotension and was taken back for open laparotomy due to bleeding from an unclear omental source.He was given fluids and 8 units PRBC perioperatively. He says his breathing has been bad since that time.   Subsequently saw pulmonogist as [place]. Ha hi-res CT, PFTs and VQ (low prob). Felt to have Golds I COPD with asthma overlap and early pulmonary fibrosis with PAH. Thought felt pulmonary pressures may have been elevated due to recent illness. Referred to Rheumatology though appt not until May. Suggested possible RHC.   He was also seen by Cardiology. Echocardiogram 8/18 as below showed normal LV function with septal flattening and RVSP 65-67mHG. There was no comment on RV size or function. 14-day event monitor showed 10-beat run NSVT. Felt to have chronic AF and PAH. No further testing ordered.  Saw Dr. MBettina Gaviaearlier this week for second opinion and referred here for further evaluation of PAH and RV failure.  Says he tries to remain active. Walks on TM (at 2.4 mph/hr) and works on the farm but says he cannot do too much especially when it is humid or damp out. Struggles with  severe LE edema but had to cut back lasix due to low BP. Denies recent syncope/presyncope. No orthopnea or PND. + bendopnea. Says he often turns purple. Follows pulse ox at home and sats in the mid 80s around the house. Does not wear O2. Has had rash on his body and face (recent biopsy with leukocytoclastic vasculitis per notes). Denies arthralgias.  Studies:  Echo 8/18  1. LVEF is estimated to be 55%. 2. There is septal flattening consistent with right ventricular pressure and or volume overload. 3. A mechanical prosthetic aortic valve is present with mild AI 4. There is moderate to severe tricuspid regurgitation. 5. There is a trivial amount of pulmonic regurgitation. 6. The right ventricular systolic pressure is calculated at 65-758mg. 7. The inferior vena cava is dilated with <50% inspiratory collapse, suggestive of an elevated right atrial pressure (1515m).  14 Day Patch 10/01/2017-10/15/2017:  -1 run of Ventricular Tachycardia occurred lasting 10 beats with a max rate of 240 bpm (avg 161 bpm). -Atrial Flutter occurred continuously (100% burden), ranging from 55-132 bpm (avg of 87 bpm).   NM Ventilation and Perfusion Lung Scan 10/31/2017:  Low probability of pulmonary embolism, using the PIOPED criteria.  Lower extremity venous duplex 07/25/2017:  1. Low probability of acute deep vein thrombosis in bilateral lower extremities. 2. Calf veins are poorly visualized but appear patent in limited segments visualized.   PFTs 07/02/2017:  FVC86% FEV185% FEV1/FVC 73% DLCO8.2, 32%   6MW -1400 feet;  97% to 85% (recovered with resting)   CT Chest07/08/2017: 1. Stable right middle lobe pulmonary nodule measuring up to 1 cm. 2. Minimal subpleural reticulation without evidence of honeycombing to  suggest pulmonary fibrosis. Stable centrilobular emphysema. 3. Mediastinal lymphadenopathy, increased from prior. 4. Calcified right  hilar lymph nodes and multiple calcified granulomas  throughout the right lung consistent with prior granulomatous disease. 5. Ascending thoracic aortic aneurysm measuring up to 4.5 cm, unchanged. 6. Dilatation of the main pulmonary artery suggestive of pulmonary  hypertension. 7. Cardiomegaly and dense atherosclerotic coronary artery calcifications.   Review of Systems: [y] = yes, _0  = no    General: Weight gain _1 ; Weight loss _2 ; Anorexia _3 ; Fatigue [y]; Fever _4 ; Chills _5 ; Weakness _6    Cardiac: Chest pain/pressure _7 ; Resting SOB _8 ; Exertional SOB [y]; Orthopnea _9 ; Pedal Edema [y]; Palpitations _10 ; Syncope _11 ; Presyncope _12 ; Paroxysmal nocturnal dyspnea_13    Pulmonary: Cough _14 ; Wheezing_15 ; Hemoptysis_16 ; Sputum _17 ; Snoring _18    GI: Vomiting_19 ; Dysphagia_20 ; Melena_21 ; Hematochezia _22 ; Heartburn_23 ; Abdominal pain _24 ; Constipation _25 ; Diarrhea _26 ; BRBPR _27    GU: Hematuria_28 ; Dysuria _29 ; Nocturia_30   Vascular: Pain in legs with walking _31 ; Pain in feet with lying flat _32 ; Non-healing sores _33 ; Stroke _34 ; TIA _35 ; Slurred speech _36 ;   Neuro: Headaches_37 ; Vertigo_38 ; Seizures_39 ; Paresthesias_40 ;Blurred vision _41 ; Diplopia _42 ; Vision changes _43    Ortho/Skin: Arthritis _44 ; Joint pain _45 ; Muscle pain _46 ; Joint swelling _47 ; Back Pain _48 ; Rash _49    Psych: Depression_50 ; Anxiety_51    Heme: Bleeding problems _52 ; Clotting disorders _53 ; Anemia _54    Endocrine: Diabetes _55 ; Thyroid dysfunction_56    Past Medical History:  Diagnosis Date  . 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 In light of concurrent left nephrolithiasis, will check CT renal colic next year instead of renal US.   [name] Anticoagulated on Coumadin 01/04/2018  . Anxiety 05/10/2016   Last Assessment & Plan:  Doing well off of zoloft.  . Asymptomatic microscopic hematuria 06/20/2017   Last Assessment & Plan:   [facility] in 2016 which negative CT and cystoscopy. UA with 2+ blood last visit - we discussed recommendation for repeat workup at 5 years or if degree of hematuria progresses.   . Atrial fibrillation ([place]) 03/29/2017  . Atrial flutter ([place]) 03/29/2017  . Chronic allergic rhinitis 04/28/2016   Last Assessment & Plan:  Continue astelin  . Chronic anticoagulation 03/29/2017  . Chronic atrial fibrillation ([place]) 07/23/2015   Last Assessment & Plan:  Coumadin and metoprolol, cardiology referral to establish care.  . Chronic midline back pain 12/14/2015   Last Assessment & Plan:  Pain management referral for further evaluation.  . Chronic prostatitis 07/23/2015   Last Assessment & Plan:  Has largely resolved since stopping bike riding. Recommend annual DRE AND PSA - will see back 12/2015 for annual screening, given 1st degree fhx. To call office for recurrent prostatitis symptoms.   [name] COPD (chronic obstructive pulmonary disease) ([place]) 06/12/2016  . Coronary artery disease involving native coronary artery of native heart with angina pectoris ([place]) 03/29/2017  . Cough 10/17/2016   Last Assessment & Plan:  Discussed typical  course for acute viral illness. If symptoms worsen or fail to improve by 7-10d, delayed ATBs, fluids, rest, NSAIDs/APAP prn. Seek care if not improving. Needs earlier INR check due to ATBs.  [name] Dyspnea 02/01/2016   Last Assessment & Plan:  Overall improving, eval by pulm, plan for CT, neg stress test with cardiology. Recent switch to carvedilol due to side effects.  . Epidermoid cyst of skin 08/24/2017  . Essential hypertension 12/14/2015   Last Assessment & Plan:  Hypertension control: controlled  Medications: compliant Medication Management: as noted in orders Home blood pressure monitoring recommended additionally as needed for symptoms  The patient's care plan was reviewed and updated. Instructions and counseling were provided regarding patient goals and barriers. He was counseled  to adopt a healthy lifestyle. Educational resources and self-management tools have been provided as charted in [facility] list.   . H/O maze procedure 03/29/2017  . [name] 03/29/2017   Overview:  2011  . Hx of CABG 03/29/2017  . Hyperlipidemia 03/29/2017  . Hypertensive heart disease 03/29/2017  . Kidney stones 07/23/2015   Overview:  x 3  Last Assessment & Plan:  By [place] has left nephrolithiasis, but not visible by KUB. Will check CT renal colic next year to assess both stone burden as well as to surveil AML.   . Leukocytoclastic vasculitis ([place]) 10/01/2017  . Localized edema 01/04/2018  . Lumbar radicular pain 01/19/2016  . Maculopapular rash 09/03/2017  . Nephrolithiasis 07/23/2015   Overview:  x 3  Last Assessment & Plan:  By [place] has left nephrolithiasis, but not visible by KUB. Will check CT renal colic next year to assess both stone burden as well as to surveil AML.   Overview:  x 3  Last Assessment & Plan:  Has 73m nonobstructing LUP stone - not visible by KUB.  Will check renal [place] - he will contact office sooner if symptomatic.   . Non-sustained ventricular tachycardia ([place] 03/29/2017  . Other hyperlipidemia 03/29/2017  . Palpitations 10/01/2017  . Paroxysmal atrial fibrillation ([place] 03/29/2017  . Paroxysmal atrial fibrillation ([place] 03/29/2017  . Pleural effusion, bilateral 08/09/2017  . Post-nasal drainage 02/25/2016   Last Assessment & Plan:  Trial zyrtec and flonase  . Prostate cancer screening 06/20/2017   Last Assessment & Plan:  Recommend continued annual CaP screening until within 10 years of life expectancy. Given good health and fhx of longevity, would anticipate CaP screening to continue until age 715  PSA today and again in one year on day of visit.  . Pulmonary hypertension ([place] 08/09/2017  . Pulmonary nodules 06/12/2016  . S/P AVR (aortic valve replacement) 03/20/2016  . Supratherapeutic INR 07/26/2017  . Syncope 03/29/2017  . Syncope and collapse 02/01/2016  . Typical  atrial flutter ([place] 02/01/2016    Past Surgical History:  Procedure Laterality Date  . CHOLECYSTECTOMY    . CORONARY ARTERY BYPASS GRAFT    . EXPLORATORY LAPAROTOMY  07/30/2017  . FOOT SURGERY    . FRACTURE SURGERY Right    wrist and forearm  . HERNIA REPAIR    . MECHANICAL AORTIC VALVE REPLACEMENT    . NASAL SINUS SURGERY    . VASECTOMY      Current Medications: Current Meds  Medication Sig  . aspirin 81 MG chewable tablet Chew 1 tablet by mouth every morning.  . budesonide (PULMICORT) 0.5 MG/2ML nebulizer solution daily as needed.  . cetirizine (ZYRTEC ALLERGY) 10 MG tablet Take 10 mg by mouth daily.  .[name]  furosemide (LASIX) 40 MG tablet Take 0.5 tablets (20 mg total) by mouth every other day. Take on the off day if you weight increases 3 lbs  . metoprolol tartrate (LOPRESSOR) 25 MG tablet Take 1 tablet by mouth 2 (two) times daily.  . Multiple Vitamins-Minerals (MULTIVITAMIN ADULTS PO) Take 1 tablet by mouth daily.  . TUDORZA PRESSAIR 400 MCG/ACT AEPB as needed.  . warfarin (COUMADIN) 2.5 MG tablet Take 1 tablet by mouth 6 days.  [name] warfarin (COUMADIN) 5 MG tablet Take 1 tablet by mouth once a week.     Allergies:   Amoxicillin-pot clavulanate and Tape   Social History   Socioeconomic History  . Marital status: Unknown    Spouse name: None  . Number of children: None  . Years of education: None  . Highest education level: None  Social Needs  . Financial resource strain: None  . Food insecurity - worry: None  . Food insecurity - inability: None  . Transportation needs - medical: None  . Transportation needs - non-medical: None  Occupational History  . None  Tobacco Use  . Smoking status: Former Smoker    Packs/day: 2.00    Years: 34.00    Pack years: 68.00    Types: Cigarettes    Last attempt to quit: 07/16/2000    Years since quitting: 17.5  . Smokeless tobacco: Never Used  Substance and Sexual Activity  . Alcohol use: Yes  . Drug use: No  . Sexual activity:  None  Other Topics Concern  . None  Social History Narrative  . None     Family History: The patient's family history includes Arthritis in his mother; Asthma in his mother; Heart attack in his father; Hypertension in his father; Stroke in his paternal grandmother.   Recent Labs: 01/09/2018: BNP 668.0; BUN 23; Creatinine, Ser 0.97; Potassium 3.5; Sodium 141  Recent Lipid Panel No results found for: CHOL, TRIG, HDL, CHOLHDL, VLDL, LDLCALC, LDLDIRECT  Physical Exam:    VS:  Pulse (!) 44   Ht _0  (1.753 m)   Wt 154 lb (69.9 kg)   SpO2 94%   BMI 22.74 kg/m     Wt Readings from Last 3 Encounters:  01/11/18 154 lb (69.9 kg)  01/09/18 155 lb (70.3 kg)    sats 95% at rest 93% with hall walk   General:  Thin male. Mildly cyanotic. No resp difficulty HEENT: normal facial discoloration  Neck: supple. JVP to jaw Carotids 2+ bilat; no bruits. No lymphadenopathy or thryomegaly appreciated. Cor: PMI nondisplaced. + RV lift IRR. IRR  Mechanical s2 2/6 TR with loud P@ Lungs: clear Abdomen: soft, nontender, nondistended. + pulsatile liver edge No bruits or masses. Good bowel sounds. Extremities: no cyanosis, clubbing, rash, 2-3+ edema  Mildly cyanoitc Neuro: alert & orientedx3, cranial nerves grossly intact. moves all 4 extremities w/o difficulty. Affect pleasant  ECG Atrial flutter 85 with RVH and RV strain   ASSESSMENT/PLAN:    1. Pulmonary hypertension with cor pulmonale - based on work-up to date he has significant pulmonary HTN with RV failure and volume overload - etiology remains unclear. Although he was a smoker and has evidence of cyanosis on exam (and with O2 monitoring at home), his hi-RES CT and PFTs (normal spirometry and markedly reduced DLCO) argue against significant underlying parenchymal lung disease. I suspect his hypoxia is likely secondary to his PH. He has also had negative VQ making CTEPH unlikely - I suspect may have primary but also have  to exclude component of  constriction with previous CABG/AVR  - Will plan the following: 1. RHC via brachial approach (can continue coumadin). Unable to do simultaneous L/R for constriction due to mechanical AVR 2. cMRI to evaluate RV and to assess for constrictive physiology 3. Check serology: ANA, ANCA, [place]-70, RF, anti-CCP 4. Will likely need home oxygen but ambulatory sats here >90% so won't qualify yet 5. Check overnight oximetry. Consider sleep study (was negative 12 years ago) 6. Continue lasix. Add spironolactone 12.55m daily. Check BMET in 1 week 7. Place compression hose 8. Consider Pulmonary Rehab  2. Chronic AFL - rate controlled on lopressor. Continue coumadin  3. CAD - s/p CABG.  - stable no s/s angina - continue ASA. Will need statin  4. S/p mechanical AVR - stable on echo. Continue coumadin/ASA 81 - aware of need for SBE prophylaxis  5. Leukocytoclastic vasculitis - needs f/u with Rheumatology  RTC in 4 weeks to discuss results and plan next steps.   Total time spent 60 minutes. Over half that time spent discussing above.    DGlori Bickers MD  11:24 PM

## 2018-01-11 NOTE — H&P (View-Only) (Signed)
[name]   Date:  01/11/2018   ID:  Lance Hicks, DOB 11-18-1946, MRN [id number]  PCP:  Townsend Roger, MD  Cardiologist:  Dr. 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 Also has COPD (quit smoking in 2002 - 1 ppd x 25 years), HL, HTN referred by Dr. Bettina Gavia for further evaluation of RV failure  He moved from [place] about 2 years ago and has been followed by Pulmonary and Cardiology art [facility]. (I have reviewed these records in [place])  In 8/18 admitted for acute pancreatitis. Underwent lap cholecystectomy on 07/29/2017. On 8/6 hedeveloped hypotension and was taken back for open laparotomy due to bleeding from an unclear omental source.He was given fluids and 8 units PRBC perioperatively. He says his breathing has been bad since that time.   Subsequently saw pulmonogist as [place]. Ha hi-res CT, PFTs and VQ (low prob). Felt to have Golds I COPD with asthma overlap and early pulmonary fibrosis with PAH. Thought felt pulmonary pressures may have been elevated due to recent illness. Referred to Rheumatology though appt not until May. Suggested possible RHC.   He was also seen by Cardiology. Echocardiogram 8/18 as below showed normal LV function with septal flattening and RVSP 65-47mHG. There was no comment on RV size or function. 14-day event monitor showed 10-beat run NSVT. Felt to have chronic AF and PAH. No further testing ordered.  Saw Dr. MBettina Gaviaearlier this week for second opinion and referred here for further evaluation of PAH and RV failure.  Says he tries to remain active. Walks on TM (at 2.4 mph/hr) and works on the farm but says he cannot do too much especially when it is humid or damp out. Struggles with  severe LE edema but had to cut back lasix due to low BP. Denies recent syncope/presyncope. No orthopnea or PND. + bendopnea. Says he often turns purple. Follows pulse ox at home and sats in the mid 80s around the house. Does not wear O2. Has had rash on his body and face (recent biopsy with leukocytoclastic vasculitis per notes). Denies arthralgias.  Studies:  Echo 8/18  1. LVEF is estimated to be 55%. 2. There is septal flattening consistent with right ventricular pressure and or volume overload. 3. A mechanical prosthetic aortic valve is present with mild AI 4. There is moderate to severe tricuspid regurgitation. 5. There is a trivial amount of pulmonic regurgitation. 6. The right ventricular systolic pressure is calculated at 65-770mg. 7. The inferior vena cava is dilated with <50% inspiratory collapse, suggestive of an elevated right atrial pressure (1584m).  14 Day Patch 10/01/2017-10/15/2017:  -1 run of Ventricular Tachycardia occurred lasting 10 beats with a max rate of 240 bpm (avg 161 bpm). -Atrial Flutter occurred continuously (100% burden), ranging from 55-132 bpm (avg of 87 bpm).   NM Ventilation and Perfusion Lung Scan 10/31/2017:  Low probability of pulmonary embolism, using the PIOPED criteria.  Lower extremity venous duplex 07/25/2017:  1. Low probability of acute deep vein thrombosis in bilateral lower extremities. 2. Calf veins are poorly visualized but appear patent in limited segments visualized.   PFTs 07/02/2017:  FVC86% FEV185% FEV1/FVC 73% DLCO8.2, 32%   6MW -1400 feet;  97% to 85% (recovered with resting)   CT Chest07/08/2017: 1. Stable right middle lobe pulmonary nodule measuring up to 1 cm. 2. Minimal subpleural reticulation without evidence of honeycombing to  suggest pulmonary fibrosis. Stable centrilobular emphysema. 3. Mediastinal lymphadenopathy, increased from prior. 4. Calcified right  hilar lymph nodes and multiple calcified granulomas  throughout the right lung consistent with prior granulomatous disease. 5. Ascending thoracic aortic aneurysm measuring up to 4.5 cm, unchanged. 6. Dilatation of the main pulmonary artery suggestive of pulmonary  hypertension. 7. Cardiomegaly and dense atherosclerotic coronary artery calcifications.   Review of Systems: [y] = yes, _0  = no    General: Weight gain _1 ; Weight loss _2 ; Anorexia _3 ; Fatigue [y]; Fever _4 ; Chills _5 ; Weakness _6    Cardiac: Chest pain/pressure _7 ; Resting SOB _8 ; Exertional SOB [y]; Orthopnea _9 ; Pedal Edema [y]; Palpitations _10 ; Syncope _11 ; Presyncope _12 ; Paroxysmal nocturnal dyspnea_13    Pulmonary: Cough _14 ; Wheezing_15 ; Hemoptysis_16 ; Sputum _17 ; Snoring _18    GI: Vomiting_19 ; Dysphagia_20 ; Melena_21 ; Hematochezia _22 ; Heartburn_23 ; Abdominal pain _24 ; Constipation _25 ; Diarrhea _26 ; BRBPR _27    GU: Hematuria_28 ; Dysuria _29 ; Nocturia_30   Vascular: Pain in legs with walking _31 ; Pain in feet with lying flat _32 ; Non-healing sores _33 ; Stroke _34 ; TIA _35 ; Slurred speech _36 ;   Neuro: Headaches_37 ; Vertigo_38 ; Seizures_39 ; Paresthesias_40 ;Blurred vision _41 ; Diplopia _42 ; Vision changes _43    Ortho/Skin: Arthritis _44 ; Joint pain _45 ; Muscle pain _46 ; Joint swelling _47 ; Back Pain _48 ; Rash _49    Psych: Depression_50 ; Anxiety_51    Heme: Bleeding problems _52 ; Clotting disorders _53 ; Anemia _54    Endocrine: Diabetes _55 ; Thyroid dysfunction_56    Past Medical History:  Diagnosis Date  . 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 In light of concurrent left nephrolithiasis, will check CT renal colic next year instead of renal US.   [name] Anticoagulated on Coumadin 01/04/2018  . Anxiety 05/10/2016   Last Assessment & Plan:  Doing well off of zoloft.  . Asymptomatic microscopic hematuria 06/20/2017   Last Assessment & Plan:   [facility] 2016 which negative CT and cystoscopy. UA with 2+ blood last visit - we discussed recommendation for repeat workup at 5 years or if degree of hematuria progresses.   . Atrial fibrillation ([place]) 03/29/2017  . Atrial flutter ([place]) 03/29/2017  . Chronic allergic rhinitis 04/28/2016   Last Assessment & Plan:  Continue astelin  . Chronic anticoagulation 03/29/2017  . Chronic atrial fibrillation ([place]) 07/23/2015   Last Assessment & Plan:  Coumadin and metoprolol, cardiology referral to establish care.  . Chronic midline back pain 12/14/2015   Last Assessment & Plan:  Pain management referral for further evaluation.  . Chronic prostatitis 07/23/2015   Last Assessment & Plan:  Has largely resolved since stopping bike riding. Recommend annual DRE AND PSA - will see back 12/2015 for annual screening, given 1st degree fhx. To call office for recurrent prostatitis symptoms.   [name] COPD (chronic obstructive pulmonary disease) ([place]) 06/12/2016  . Coronary artery disease involving native coronary artery of native heart with angina pectoris ([place]) 03/29/2017  . Cough 10/17/2016   Last Assessment & Plan:  Discussed typical  course for acute viral illness. If symptoms worsen or fail to improve by 7-10d, delayed ATBs, fluids, rest, NSAIDs/APAP prn. Seek care if not improving. Needs earlier INR check due to ATBs.  [name] Dyspnea 02/01/2016   Last Assessment & Plan:  Overall improving, eval by pulm, plan for CT, neg stress test with cardiology. Recent switch to carvedilol due to side effects.  . Epidermoid cyst of skin 08/24/2017  . Essential hypertension 12/14/2015   Last Assessment & Plan:  Hypertension control: controlled  Medications: compliant Medication Management: as noted in orders Home blood pressure monitoring recommended additionally as needed for symptoms  The patient's care plan was reviewed and updated. Instructions and counseling were provided regarding patient goals and barriers. He was counseled  to adopt a healthy lifestyle. Educational resources and self-management tools have been provided as charted in [facility] list.   . H/O maze procedure 03/29/2017  . [name] 03/29/2017   Overview:  2011  . Hx of CABG 03/29/2017  . Hyperlipidemia 03/29/2017  . Hypertensive heart disease 03/29/2017  . Kidney stones 07/23/2015   Overview:  x 3  Last Assessment & Plan:  By [place] has left nephrolithiasis, but not visible by KUB. Will check CT renal colic next year to assess both stone burden as well as to surveil AML.   . Leukocytoclastic vasculitis ([place]) 10/01/2017  . Localized edema 01/04/2018  . Lumbar radicular pain 01/19/2016  . Maculopapular rash 09/03/2017  . Nephrolithiasis 07/23/2015   Overview:  x 3  Last Assessment & Plan:  By [place] has left nephrolithiasis, but not visible by KUB. Will check CT renal colic next year to assess both stone burden as well as to surveil AML.   Overview:  x 3  Last Assessment & Plan:  Has 73m nonobstructing LUP stone - not visible by KUB.  Will check renal [place] - he will contact office sooner if symptomatic.   . Non-sustained ventricular tachycardia ([place] 03/29/2017  . Other hyperlipidemia 03/29/2017  . Palpitations 10/01/2017  . Paroxysmal atrial fibrillation ([place] 03/29/2017  . Paroxysmal atrial fibrillation ([place] 03/29/2017  . Pleural effusion, bilateral 08/09/2017  . Post-nasal drainage 02/25/2016   Last Assessment & Plan:  Trial zyrtec and flonase  . Prostate cancer screening 06/20/2017   Last Assessment & Plan:  Recommend continued annual CaP screening until within 10 years of life expectancy. Given good health and fhx of longevity, would anticipate CaP screening to continue until age 715  PSA today and again in one year on day of visit.  . Pulmonary hypertension ([place] 08/09/2017  . Pulmonary nodules 06/12/2016  . S/P AVR (aortic valve replacement) 03/20/2016  . Supratherapeutic INR 07/26/2017  . Syncope 03/29/2017  . Syncope and collapse 02/01/2016  . Typical  atrial flutter ([place] 02/01/2016    Past Surgical History:  Procedure Laterality Date  . CHOLECYSTECTOMY    . CORONARY ARTERY BYPASS GRAFT    . EXPLORATORY LAPAROTOMY  07/30/2017  . FOOT SURGERY    . FRACTURE SURGERY Right    wrist and forearm  . HERNIA REPAIR    . MECHANICAL AORTIC VALVE REPLACEMENT    . NASAL SINUS SURGERY    . VASECTOMY      Current Medications: Current Meds  Medication Sig  . aspirin 81 MG chewable tablet Chew 1 tablet by mouth every morning.  . budesonide (PULMICORT) 0.5 MG/2ML nebulizer solution daily as needed.  . cetirizine (ZYRTEC ALLERGY) 10 MG tablet Take 10 mg by mouth daily.  .[name]  furosemide (LASIX) 40 MG tablet Take 0.5 tablets (20 mg total) by mouth every other day. Take on the off day if you weight increases 3 lbs  . metoprolol tartrate (LOPRESSOR) 25 MG tablet Take 1 tablet by mouth 2 (two) times daily.  . Multiple Vitamins-Minerals (MULTIVITAMIN ADULTS PO) Take 1 tablet by mouth daily.  . TUDORZA PRESSAIR 400 MCG/ACT AEPB as needed.  . warfarin (COUMADIN) 2.5 MG tablet Take 1 tablet by mouth 6 days.  [name] warfarin (COUMADIN) 5 MG tablet Take 1 tablet by mouth once a week.     Allergies:   Amoxicillin-pot clavulanate and Tape   Social History   Socioeconomic History  . Marital status: Unknown    Spouse name: None  . Number of children: None  . Years of education: None  . Highest education level: None  Social Needs  . Financial resource strain: None  . Food insecurity - worry: None  . Food insecurity - inability: None  . Transportation needs - medical: None  . Transportation needs - non-medical: None  Occupational History  . None  Tobacco Use  . Smoking status: Former Smoker    Packs/day: 2.00    Years: 34.00    Pack years: 68.00    Types: Cigarettes    Last attempt to quit: 07/16/2000    Years since quitting: 17.5  . Smokeless tobacco: Never Used  Substance and Sexual Activity  . Alcohol use: Yes  . Drug use: No  . Sexual activity:  None  Other Topics Concern  . None  Social History Narrative  . None     Family History: The patient's family history includes Arthritis in his mother; Asthma in his mother; Heart attack in his father; Hypertension in his father; Stroke in his paternal grandmother.   Recent Labs: 01/09/2018: BNP 668.0; BUN 23; Creatinine, Ser 0.97; Potassium 3.5; Sodium 141  Recent Lipid Panel No results found for: CHOL, TRIG, HDL, CHOLHDL, VLDL, LDLCALC, LDLDIRECT  Physical Exam:    VS:  Pulse (!) 44   Ht _0  (1.753 m)   Wt 154 lb (69.9 kg)   SpO2 94%   BMI 22.74 kg/m     Wt Readings from Last 3 Encounters:  01/11/18 154 lb (69.9 kg)  01/09/18 155 lb (70.3 kg)    sats 95% at rest 93% with hall walk   General:  Thin male. Mildly cyanotic. No resp difficulty HEENT: normal facial discoloration  Neck: supple. JVP to jaw Carotids 2+ bilat; no bruits. No lymphadenopathy or thryomegaly appreciated. Cor: PMI nondisplaced. + RV lift IRR. IRR  Mechanical s2 2/6 TR with loud P@ Lungs: clear Abdomen: soft, nontender, nondistended. + pulsatile liver edge No bruits or masses. Good bowel sounds. Extremities: no cyanosis, clubbing, rash, 2-3+ edema  Mildly cyanoitc Neuro: alert & orientedx3, cranial nerves grossly intact. moves all 4 extremities w/o difficulty. Affect pleasant  ECG Atrial flutter 85 with RVH and RV strain   ASSESSMENT/PLAN:    1. Pulmonary hypertension with cor pulmonale - based on work-up to date he has significant pulmonary HTN with RV failure and volume overload - etiology remains unclear. Although he was a smoker and has evidence of cyanosis on exam (and with O2 monitoring at home), his hi-RES CT and PFTs (normal spirometry and markedly reduced DLCO) argue against significant underlying parenchymal lung disease. I suspect his hypoxia is likely secondary to his PH. He has also had negative VQ making CTEPH unlikely - I suspect may have primary but also have  to exclude component of  constriction with previous CABG/AVR  - Will plan the following: 1. RHC via brachial approach (can continue coumadin). Unable to do simultaneous L/R for constriction due to mechanical AVR 2. cMRI to evaluate RV and to assess for constrictive physiology 3. Check serology: ANA, ANCA, [place]-70, RF, anti-CCP 4. Will likely need home oxygen but ambulatory sats here >90% so won't qualify yet 5. Check overnight oximetry. Consider sleep study (was negative 12 years ago) 6. Continue lasix. Add spironolactone 12.55m daily. Check BMET in 1 week 7. Place compression hose 8. Consider Pulmonary Rehab  2. Chronic AFL - rate controlled on lopressor. Continue coumadin  3. CAD - s/p CABG.  - stable no s/s angina - continue ASA. Will need statin  4. S/p mechanical AVR - stable on echo. Continue coumadin/ASA 81 - aware of need for SBE prophylaxis  5. Leukocytoclastic vasculitis - needs f/u with Rheumatology  RTC in 4 weeks to discuss results and plan next steps.   Total time spent 60 minutes. Over half that time spent discussing above.    DGlori Bickers MD  11:24 PM

## 2018-01-12 LAB — RHEUMATOID FACTOR: Rhuematoid fact SerPl-aCnc: <10 IU/mL (ref 0.0–13.9)

## 2018-01-12 LAB — ANTI-SCLERODERMA ANTIBODY: Scleroderma (Scl-70) (ENA) Antibody, IgG: 0.3 AI (ref 0.0–0.9)

## 2018-01-14 ENCOUNTER — Other Ambulatory Visit (HOSPITAL_COMMUNITY): Payer: Self-pay | Admitting: *Deleted

## 2018-01-14 DIAGNOSIS — I2699 Other pulmonary embolism without acute cor pulmonale: Secondary | ICD-10-CM | POA: Diagnosis not present

## 2018-01-14 DIAGNOSIS — I483 Typical atrial flutter: Secondary | ICD-10-CM

## 2018-01-15 ENCOUNTER — Other Ambulatory Visit (HOSPITAL_COMMUNITY): Payer: Self-pay | Admitting: *Deleted

## 2018-01-15 ENCOUNTER — Encounter: Payer: Self-pay | Admitting: Internal Medicine

## 2018-01-15 DIAGNOSIS — N200 Calculus of kidney: Secondary | ICD-10-CM | POA: Diagnosis not present

## 2018-01-15 DIAGNOSIS — I483 Typical atrial flutter: Secondary | ICD-10-CM

## 2018-01-15 DIAGNOSIS — N132 Hydronephrosis with renal and ureteral calculous obstruction: Secondary | ICD-10-CM | POA: Diagnosis not present

## 2018-01-17 ENCOUNTER — Other Ambulatory Visit (HOSPITAL_COMMUNITY): Payer: Medicare Other

## 2018-01-18 ENCOUNTER — Ambulatory Visit (HOSPITAL_COMMUNITY)
Admission: RE | Admit: 2018-01-18 | Discharge: 2018-01-18 | Disposition: A | Discharge status: Home / Self Care | Payer: Medicare Other | Source: Ambulatory Visit | Attending: Internal Medicine | Admitting: Internal Medicine

## 2018-01-18 ENCOUNTER — Ambulatory Visit (HOSPITAL_COMMUNITY)
Admission: RE | Disposition: A | Discharge status: Home / Self Care | Payer: Self-pay | Source: Ambulatory Visit | Attending: Internal Medicine

## 2018-01-18 DIAGNOSIS — I071 Rheumatic tricuspid insufficiency: Secondary | ICD-10-CM | POA: Insufficient documentation

## 2018-01-18 DIAGNOSIS — M5489 Other dorsalgia: Secondary | ICD-10-CM | POA: Diagnosis not present

## 2018-01-18 DIAGNOSIS — Z951 Presence of aortocoronary bypass graft: Secondary | ICD-10-CM | POA: Insufficient documentation

## 2018-01-18 DIAGNOSIS — E785 Hyperlipidemia, unspecified: Secondary | ICD-10-CM | POA: Insufficient documentation

## 2018-01-18 DIAGNOSIS — G8929 Other chronic pain: Secondary | ICD-10-CM | POA: Diagnosis not present

## 2018-01-18 DIAGNOSIS — I25119 Atherosclerotic heart disease of native coronary artery with unspecified angina pectoris: Secondary | ICD-10-CM | POA: Diagnosis not present

## 2018-01-18 DIAGNOSIS — I482 Chronic atrial fibrillation: Secondary | ICD-10-CM | POA: Insufficient documentation

## 2018-01-18 DIAGNOSIS — Z7901 Long term (current) use of anticoagulants: Secondary | ICD-10-CM | POA: Diagnosis not present

## 2018-01-18 DIAGNOSIS — Z7982 Long term (current) use of aspirin: Secondary | ICD-10-CM | POA: Insufficient documentation

## 2018-01-18 DIAGNOSIS — I2721 Secondary pulmonary arterial hypertension: Secondary | ICD-10-CM

## 2018-01-18 DIAGNOSIS — F419 Anxiety disorder, unspecified: Secondary | ICD-10-CM | POA: Insufficient documentation

## 2018-01-18 DIAGNOSIS — I350 Nonrheumatic aortic (valve) stenosis: Secondary | ICD-10-CM | POA: Diagnosis not present

## 2018-01-18 DIAGNOSIS — I119 Hypertensive heart disease without heart failure: Secondary | ICD-10-CM | POA: Insufficient documentation

## 2018-01-18 DIAGNOSIS — Z952 Presence of prosthetic heart valve: Secondary | ICD-10-CM | POA: Insufficient documentation

## 2018-01-18 DIAGNOSIS — I776 Arteritis, unspecified: Secondary | ICD-10-CM | POA: Diagnosis not present

## 2018-01-18 DIAGNOSIS — I2781 Cor pulmonale (chronic): Secondary | ICD-10-CM | POA: Diagnosis not present

## 2018-01-18 DIAGNOSIS — I272 Pulmonary hypertension, unspecified: Secondary | ICD-10-CM

## 2018-01-18 DIAGNOSIS — I2510 Atherosclerotic heart disease of native coronary artery without angina pectoris: Secondary | ICD-10-CM | POA: Diagnosis not present

## 2018-01-18 DIAGNOSIS — I483 Typical atrial flutter: Secondary | ICD-10-CM | POA: Diagnosis not present

## 2018-01-18 DIAGNOSIS — Z881 Allergy status to other antibiotic agents status: Secondary | ICD-10-CM | POA: Diagnosis not present

## 2018-01-18 DIAGNOSIS — Z79899 Other long term (current) drug therapy: Secondary | ICD-10-CM | POA: Insufficient documentation

## 2018-01-18 DIAGNOSIS — Z87891 Personal history of nicotine dependence: Secondary | ICD-10-CM | POA: Diagnosis not present

## 2018-01-18 DIAGNOSIS — J449 Chronic obstructive pulmonary disease, unspecified: Secondary | ICD-10-CM | POA: Diagnosis not present

## 2018-01-18 HISTORY — PX: RIGHT HEART CATH: CATH118263

## 2018-01-18 LAB — POCT I-STAT 3, VENOUS BLOOD GAS (G3P V)
Acid-base deficit: 4 mmol/L — ABNORMAL HIGH (ref 0.0–2.0)
Acid-base deficit: 4 mmol/L — ABNORMAL HIGH (ref 0.0–2.0)
Acid-base deficit: 5 mmol/L — ABNORMAL HIGH (ref 0.0–2.0)
Bicarbonate: 20.3 mmol/L (ref 20.0–28.0)
Bicarbonate: 21.1 mmol/L (ref 20.0–28.0)
Bicarbonate: 21.1 mmol/L (ref 20.0–28.0)
O2 Saturation: 60 %
O2 Saturation: 61 %
O2 Saturation: 62 %
TCO2: 21 mmol/L — ABNORMAL LOW (ref 22–32)
TCO2: 22 mmol/L (ref 22–32)
TCO2: 22 mmol/L (ref 22–32)
pCO2, Ven: 36.4 mmHg — ABNORMAL LOW (ref 44.0–60.0)
pCO2, Ven: 37.1 mmHg — ABNORMAL LOW (ref 44.0–60.0)
pCO2, Ven: 37.4 mmHg — ABNORMAL LOW (ref 44.0–60.0)
pH, Ven: 7.356 (ref 7.250–7.430)
pH, Ven: 7.361 (ref 7.250–7.430)
pH, Ven: 7.361 (ref 7.250–7.430)
pO2, Ven: 32 mmHg (ref 32.0–45.0)
pO2, Ven: 33 mmHg (ref 32.0–45.0)
pO2, Ven: 33 mmHg (ref 32.0–45.0)

## 2018-01-18 LAB — POCT I-STAT 3, ART BLOOD GAS (G3+)
Acid-base deficit: 3 mmol/L — ABNORMAL HIGH (ref 0.0–2.0)
Acid-base deficit: 3 mmol/L — ABNORMAL HIGH (ref 0.0–2.0)
Bicarbonate: 19.9 mmol/L — ABNORMAL LOW (ref 20.0–28.0)
Bicarbonate: 19.7 mmol/L — ABNORMAL LOW (ref 20.0–28.0)
O2 Saturation: 98 %
O2 Saturation: 93 %
TCO2: 21 mmol/L — ABNORMAL LOW (ref 22–32)
TCO2: 21 mmol/L — ABNORMAL LOW (ref 22–32)
pCO2 arterial: 28.6 mmHg — ABNORMAL LOW (ref 32.0–48.0)
pCO2 arterial: 30.3 mmHg — ABNORMAL LOW (ref 32.0–48.0)
pH, Arterial: 7.45 (ref 7.350–7.450)
pH, Arterial: 7.422 (ref 7.350–7.450)
pO2, Arterial: 106 mmHg (ref 83.0–108.0)
pO2, Arterial: 64 mmHg — ABNORMAL LOW (ref 83.0–108.0)

## 2018-01-18 LAB — CBC
HCT: 47.4 % (ref 39.0–52.0)
Hemoglobin: 15.9 g/dL (ref 13.0–17.0)
MCH: 29.4 pg (ref 26.0–34.0)
MCHC: 33.5 g/dL (ref 30.0–36.0)
MCV: 87.8 fL (ref 78.0–100.0)
Platelets: 232 10*3/uL (ref 150–400)
RBC: 5.4 MIL/uL (ref 4.22–5.81)
RDW: 19.2 % — ABNORMAL HIGH (ref 11.5–15.5)
WBC: 8.5 10*3/uL (ref 4.0–10.5)

## 2018-01-18 LAB — BASIC METABOLIC PANEL
Anion gap: 18 — ABNORMAL HIGH (ref 5–15)
BUN: 44 mg/dL — ABNORMAL HIGH (ref 6–20)
CO2: 15 mmol/L — ABNORMAL LOW (ref 22–32)
Calcium: 9 mg/dL (ref 8.9–10.3)
Chloride: 100 mmol/L — ABNORMAL LOW (ref 101–111)
Creatinine, Ser: 1.99 mg/dL — ABNORMAL HIGH (ref 0.61–1.24)
GFR calc Af Amer: 37 mL/min — ABNORMAL LOW (ref >60)
GFR calc non Af Amer: 32 mL/min — ABNORMAL LOW (ref >60)
Glucose, Bld: 71 mg/dL (ref 65–99)
Potassium: 5.4 mmol/L — ABNORMAL HIGH (ref 3.5–5.1)
Sodium: 133 mmol/L — ABNORMAL LOW (ref 135–145)

## 2018-01-18 LAB — PROTIME-INR
INR: 2.02
Prothrombin Time: 22.7 seconds — ABNORMAL HIGH (ref 11.4–15.2)

## 2018-01-18 SURGERY — RIGHT HEART CATH
Anesthesia: LOCAL

## 2018-01-18 MED ORDER — SODIUM CHLORIDE 0.9% FLUSH: 3.0000 mL | Freq: Two times a day (BID) | INTRAVENOUS | Status: DC

## 2018-01-18 MED ORDER — SODIUM CHLORIDE 0.9% FLUSH: 3.0000 mL | INTRAVENOUS | Status: DC | PRN

## 2018-01-18 MED ORDER — SODIUM CHLORIDE 0.9 % IV SOLN: 250.0000 mL | INTRAVENOUS | Status: DC | PRN

## 2018-01-18 MED ORDER — FENTANYL CITRATE (PF) 100 MCG/2ML IJ SOLN
INTRAMUSCULAR | Status: AC
  Filled 2018-01-18: qty 2

## 2018-01-18 MED ORDER — LIDOCAINE HCL (PF) 1 % IJ SOLN
INTRAMUSCULAR | Status: DC | PRN
  Administered 2018-01-18: 1 mL
  Administered 2018-01-18: 2 mL

## 2018-01-18 MED ORDER — LIDOCAINE HCL 1 % IJ SOLN
INTRAMUSCULAR | Status: AC
  Filled 2018-01-18: qty 20

## 2018-01-18 MED ORDER — SODIUM CHLORIDE 0.9 % IV SOLN
INTRAVENOUS | Status: DC
  Administered 2018-01-18: 08:00:00 via INTRAVENOUS

## 2018-01-18 MED ORDER — HEPARIN (PORCINE) IN NACL 2-0.9 UNIT/ML-% IJ SOLN
INTRAMUSCULAR | Status: AC | PRN
  Administered 2018-01-18: 500 mL

## 2018-01-18 MED ORDER — FENTANYL CITRATE (PF) 100 MCG/2ML IJ SOLN
INTRAMUSCULAR | Status: DC | PRN
  Administered 2018-01-18: 25 ug via INTRAVENOUS

## 2018-01-18 MED ORDER — ASPIRIN 81 MG PO CHEW: 81.0000 mg | CHEWABLE_TABLET | ORAL | Status: DC

## 2018-01-18 MED ORDER — HEPARIN (PORCINE) IN NACL 2-0.9 UNIT/ML-% IJ SOLN
INTRAMUSCULAR | Status: AC
  Filled 2018-01-18: qty 500

## 2018-01-18 SURGICAL SUPPLY — 10 items
CATH BALLN WEDGE 5F 110CM (CATHETERS) ×2 IMPLANT
GLIDESHEATH SLEND SS 6F .021 (SHEATH) ×2 IMPLANT
GUIDEWIRE .025 260CM (WIRE) ×2 IMPLANT
PACK CARDIAC CATHETERIZATION (CUSTOM PROCEDURE TRAY) ×2 IMPLANT
PROTECTION STATION PRESSURIZED (MISCELLANEOUS) ×2
SHEATH GLIDE SLENDER 4/5FR (SHEATH) ×2 IMPLANT
STATION PROTECTION PRESSURIZED (MISCELLANEOUS) ×1 IMPLANT
TRANSDUCER W/STOPCOCK (MISCELLANEOUS) ×2 IMPLANT
TUBING ART PRESS 72  MALE/FEM (TUBING) ×1
TUBING ART PRESS 72 MALE/FEM (TUBING) ×1 IMPLANT

## 2018-01-18 NOTE — Interval H&P Note (Signed)
History and Physical Interval Note:  01/18/2018 9:22 AM  Lance Hicks  has presented today for surgery, with the diagnosis of pulmonary hypertension  The various methods of treatment have been discussed with the patient and family. After consideration of risks, benefits and other options for treatment, the patient has consented to  Procedure(s): RIGHT HEART CATH (N/A) as a surgical intervention .  The patient's history has been reviewed, patient examined, no change in status, stable for surgery.  I have reviewed the patient's chart and labs.  Questions were answered to the patient's satisfaction.     Shadonna Benedick

## 2018-01-18 NOTE — Discharge Instructions (Signed)

## 2018-01-20 ENCOUNTER — Encounter (HOSPITAL_COMMUNITY): Payer: Self-pay | Admitting: Internal Medicine

## 2018-01-21 ENCOUNTER — Ambulatory Visit (HOSPITAL_COMMUNITY)
Admission: RE | Admit: 2018-01-21 | Discharge: 2018-01-21 | Disposition: A | Discharge status: Home / Self Care | Payer: Medicare Other | Source: Ambulatory Visit | Attending: Internal Medicine | Admitting: Internal Medicine

## 2018-01-21 ENCOUNTER — Other Ambulatory Visit (HOSPITAL_COMMUNITY): Payer: Self-pay | Admitting: *Deleted

## 2018-01-21 DIAGNOSIS — I309 Acute pericarditis, unspecified: Secondary | ICD-10-CM | POA: Diagnosis not present

## 2018-01-21 DIAGNOSIS — I10 Essential (primary) hypertension: Secondary | ICD-10-CM

## 2018-01-21 DIAGNOSIS — I272 Pulmonary hypertension, unspecified: Secondary | ICD-10-CM | POA: Diagnosis not present

## 2018-01-21 DIAGNOSIS — Z952 Presence of prosthetic heart valve: Secondary | ICD-10-CM | POA: Insufficient documentation

## 2018-01-21 LAB — BASIC METABOLIC PANEL
Anion gap: 13 (ref 5–15)
BUN: 28 mg/dL — ABNORMAL HIGH (ref 6–20)
CO2: 21 mmol/L — ABNORMAL LOW (ref 22–32)
Calcium: 9.1 mg/dL (ref 8.9–10.3)
Chloride: 103 mmol/L (ref 101–111)
Creatinine, Ser: 1.58 mg/dL — ABNORMAL HIGH (ref 0.61–1.24)
GFR calc Af Amer: 49 mL/min — ABNORMAL LOW (ref >60)
GFR calc non Af Amer: 43 mL/min — ABNORMAL LOW (ref >60)
Glucose, Bld: 80 mg/dL (ref 65–99)
Potassium: 3.6 mmol/L (ref 3.5–5.1)
Sodium: 137 mmol/L (ref 135–145)

## 2018-01-21 LAB — BLOOD GAS, ARTERIAL
Acid-base deficit: 1.3 mmol/L (ref 0.0–2.0)
Bicarbonate: 21.8 mmol/L (ref 20.0–28.0)
Drawn by: 20517
FIO2: 21
O2 Saturation: 94.5 %
Patient temperature: 98.6
pCO2 arterial: 29.9 mmHg — ABNORMAL LOW (ref 32.0–48.0)
pH, Arterial: 7.476 — ABNORMAL HIGH (ref 7.350–7.450)
pO2, Arterial: 70.2 mmHg — ABNORMAL LOW (ref 83.0–108.0)

## 2018-01-21 LAB — PULMONARY FUNCTION TEST
DL/VA % pred: 40 %
DL/VA: 1.82 ml/min/mmHg/L
DLCO cor % pred: 25 %
DLCO cor: 8.05 ml/min/mmHg
DLCO unc % pred: 25 %
DLCO unc: 8.05 ml/min/mmHg
FEF 25-75 Post: 2.61 L/sec
FEF 25-75 Pre: 2.09 L/sec
FEF2575-%Change-Post: 24 %
FEF2575-%Pred-Post: 111 %
FEF2575-%Pred-Pre: 89 %
FEV1-%Change-Post: 5 %
FEV1-%Pred-Post: 84 %
FEV1-%Pred-Pre: 80 %
FEV1-Post: 2.62 L
FEV1-Pre: 2.49 L
FEV1FVC-%Change-Post: 4 %
FEV1FVC-%Pred-Pre: 106 %
FEV6-%Change-Post: 0 %
FEV6-%Pred-Post: 80 %
FEV6-%Pred-Pre: 79 %
FEV6-Post: 3.2 L
FEV6-Pre: 3.18 L
FEV6FVC-%Change-Post: 0 %
FEV6FVC-%Pred-Post: 106 %
FEV6FVC-%Pred-Pre: 105 %
FVC-%Change-Post: 0 %
FVC-%Pred-Post: 76 %
FVC-%Pred-Pre: 75 %
FVC-Post: 3.23 L
FVC-Pre: 3.2 L
Post FEV1/FVC ratio: 81 %
Post FEV6/FVC ratio: 100 %
Pre FEV1/FVC ratio: 78 %
Pre FEV6/FVC Ratio: 100 %
RV % pred: 64 %
RV: 1.55 L
TLC % pred: 68 %
TLC: 4.65 L

## 2018-01-21 MED ORDER — ALBUTEROL SULFATE (2.5 MG/3ML) 0.083% IN NEBU
2.5000 mg | INHALATION_SOLUTION | Freq: Once | RESPIRATORY_TRACT | Status: AC
  Administered 2018-01-21: 2.5 mg via RESPIRATORY_TRACT

## 2018-01-21 MED ORDER — GADOBENATE DIMEGLUMINE 529 MG/ML IV SOLN
20.0000 mL | Freq: Once | INTRAVENOUS | Status: AC | PRN
  Administered 2018-01-21: 20 mL via INTRAVENOUS

## 2018-01-21 MED FILL — Lidocaine HCl Local Inj 1%: INTRAMUSCULAR | Qty: 20 | Status: AC

## 2018-01-23 ENCOUNTER — Telehealth: Payer: Self-pay | Admitting: Cardiology

## 2018-01-23 DIAGNOSIS — R310 Gross hematuria: Secondary | ICD-10-CM | POA: Diagnosis not present

## 2018-01-23 DIAGNOSIS — N189 Chronic kidney disease, unspecified: Secondary | ICD-10-CM | POA: Diagnosis not present

## 2018-01-23 DIAGNOSIS — N201 Calculus of ureter: Secondary | ICD-10-CM | POA: Insufficient documentation

## 2018-01-23 DIAGNOSIS — I482 Chronic atrial fibrillation: Secondary | ICD-10-CM | POA: Diagnosis not present

## 2018-01-23 DIAGNOSIS — N200 Calculus of kidney: Secondary | ICD-10-CM | POA: Diagnosis not present

## 2018-01-23 DIAGNOSIS — I129 Hypertensive chronic kidney disease with stage 1 through stage 4 chronic kidney disease, or unspecified chronic kidney disease: Secondary | ICD-10-CM | POA: Diagnosis not present

## 2018-01-23 DIAGNOSIS — Z7901 Long term (current) use of anticoagulants: Secondary | ICD-10-CM | POA: Diagnosis not present

## 2018-01-23 DIAGNOSIS — J449 Chronic obstructive pulmonary disease, unspecified: Secondary | ICD-10-CM | POA: Diagnosis not present

## 2018-01-23 HISTORY — DX: Calculus of ureter: N20.1

## 2018-01-23 NOTE — Telephone Encounter (Signed)
Attempted to call number twice. Phone rings, no answer, no voicemail.

## 2018-01-23 NOTE — Telephone Encounter (Signed)
Nunzio Cory, RN, in PACU at [facility] returned call. States the patient had a lithotripsy today. Anesthesia wants to know if the patient can go home. Patient in 2-1 Atrial flutter, heart rate 107, BP 120/83. Reviewed with Dr. Agustin Cree, advised okay for patient to go home. Atrial flutter is known for patient and on the last EKG. Nunzio Cory verbalized understanding, no further questions.

## 2018-01-23 NOTE — Telephone Encounter (Signed)
Please call regarding en EKG

## 2018-01-24 DIAGNOSIS — R310 Gross hematuria: Secondary | ICD-10-CM | POA: Diagnosis not present

## 2018-01-24 DIAGNOSIS — Z7901 Long term (current) use of anticoagulants: Secondary | ICD-10-CM | POA: Diagnosis not present

## 2018-01-24 DIAGNOSIS — N201 Calculus of ureter: Secondary | ICD-10-CM | POA: Diagnosis not present

## 2018-01-24 NOTE — Progress Notes (Signed)
Cardiology Office Note:    Date:  01/25/2018   ID:  Lance Hicks, DOB [birth year], MRN [id number]  PCP:  Townsend Roger, MD  Cardiologist:  Shirlee More, MD    Referring MD: Townsend Roger, MD    ASSESSMENT:    1. Anticoagulated on Coumadin   2. Pulmonary hypertension ([place])   3. Atrial flutter, unspecified type ([place])   4. S/P AVR (aortic valve replacement)   5. Coronary artery disease involving native coronary artery of native heart with angina pectoris ([place])   6. Hypertensive heart disease with chronic diastolic congestive heart failure ([place])    PLAN:    In order of problems listed above:  1. He will continue Lovenox through Sunday morning INR at [facility] be phoned to me and I will transition him to the Coumadin clinic for subsequent management 2. Recently diagnosed he is awaiting initiation of therapy through the heart failure program 3. Stable rate controlled continue his current anticoagulant INR goal is 2.5-3.0 along with low-dose aspirin 4. Normal function 5. Stable continue medical therapy 6. Compensated at this time continue current diuretic and await initiation of therapy for his pulmonary hypertension   Next appointment: He will follow-up in the heart failure clinic Dr. [name] as well as the anticoagulant clinic.   Medication Adjustments/Labs and Tests Ordered: Current medicines are reviewed at length with the patient today.  Concerns regarding medicines are outlined above.  Orders Placed This Encounter  Procedures  . INR/PT   No orders of the defined types were placed in this encounter.   Chief Complaint  Patient presents with  . Hospitalization Follow-up    Needs INR    History of Present Illness:    Lance Hicks is a 72 y.o. male with a hx of CAD, Atrial Fibrillation with Maze procedure and LAA excision, Dyslipidemia, HTN, S/P CABG, Valvular heart disease with #25 Carbomedics AVR in 2010, Syncope with nonsustained VT, COPD, pulmonary  fibrosis and nodule  last seen in April 2018. last seen 01/09/18.Recent right heart cath showed severe PAH and started on combined therapy with Macitnentan 10 and Adcirca He is on Lovenox and warfarin after GU procedures Compliance with diet, lifestyle and medications: Yes  I am seeing him today because he was in the hospital with kidney stones and was directed to come here to manage his anticoagulant.  He is on his usual warfarin as well as Lovenox INR today is 1.8 he will take 2.5 mg of Coumadin tonight 5.0 tomorrow Sunday morning 11 INR at [facility] phone to me and our Coumadin clinic will assume management after that.  I have asked him to remain on Lovenox.  He has not yet started his therapy for pulmonary hypertension and will follow up in the heart failure program he still passing stones but not having hematuria in general he is uncomfortable but he has less edema and presently is not short of breath. Past Medical History:  Diagnosis Date  . 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 In light of concurrent left nephrolithiasis, will check CT renal colic next year instead of renal US.   [name] Anticoagulated on Coumadin 01/04/2018  . Anxiety 05/10/2016   Last Assessment & Plan:  Doing well off of zoloft.  . Asymptomatic microscopic hematuria 06/20/2017   Last Assessment & Plan:  Had hematuria workup in [facility] in 2016 which negative CT and cystoscopy. UA with 2+ blood last visit -  we discussed recommendation for repeat workup at 5 years or if degree of hematuria progresses.   . Atrial fibrillation ([place]) 03/29/2017  . Atrial flutter ([place]) 03/29/2017  . Chronic allergic rhinitis 04/28/2016   Last Assessment & Plan:  Continue astelin  . Chronic anticoagulation 03/29/2017  . Chronic atrial fibrillation ([place]) 07/23/2015   Last Assessment & Plan:  Coumadin and metoprolol, cardiology referral to establish care.  . Chronic midline back pain 12/14/2015    Last Assessment & Plan:  Pain management referral for further evaluation.  . Chronic prostatitis 07/23/2015   Last Assessment & Plan:  Has largely resolved since stopping bike riding. Recommend annual DRE AND PSA - will see back 12/2015 for annual screening, given 1st degree fhx. To call office for recurrent prostatitis symptoms.   [name] COPD (chronic obstructive pulmonary disease) ([place]) 06/12/2016  . Coronary artery disease involving native coronary artery of native heart with angina pectoris ([place]) 03/29/2017  . Cough 10/17/2016   Last Assessment & Plan:  Discussed typical course for acute viral illness. If symptoms worsen or fail to improve by 7-10d, delayed ATBs, fluids, rest, NSAIDs/APAP prn. Seek care if not improving. Needs earlier INR check due to ATBs.  [name] Dyspnea 02/01/2016   Last Assessment & Plan:  Overall improving, eval by pulm, plan for CT, neg stress test with cardiology. Recent switch to carvedilol due to side effects.  . Epidermoid cyst of skin 08/24/2017  . Essential hypertension 12/14/2015   Last Assessment & Plan:  Hypertension control: controlled  Medications: compliant Medication Management: as noted in orders Home blood pressure monitoring recommended additionally as needed for symptoms  The patient's care plan was reviewed and updated. Instructions and counseling were provided regarding patient goals and barriers. He was counseled to adopt a healthy lifestyle. Educational resources and self-management tools have been provided as charted in [facility] list.   . H/O maze procedure 03/29/2017  . [name] 03/29/2017   Overview:  2011  . Hx of CABG 03/29/2017  . Hyperlipidemia 03/29/2017  . Hypertensive heart disease 03/29/2017  . Kidney stones 07/23/2015   Overview:  x 3  Last Assessment & Plan:  By [place] has left nephrolithiasis, but not visible by KUB. Will check CT renal colic next year to assess both stone burden as well as to surveil AML.   . Leukocytoclastic vasculitis ([place])  10/01/2017  . Localized edema 01/04/2018  . Lumbar radicular pain 01/19/2016  . Maculopapular rash 09/03/2017  . Nephrolithiasis 07/23/2015   Overview:  x 3  Last Assessment & Plan:  By [place] has left nephrolithiasis, but not visible by KUB. Will check CT renal colic next year to assess both stone burden as well as to surveil AML.   Overview:  x 3  Last Assessment & Plan:  Has 61m nonobstructing LUP stone - not visible by KUB.  Will check renal [place] - he will contact office sooner if symptomatic.   . Non-sustained ventricular tachycardia ([place] 03/29/2017  . Other hyperlipidemia 03/29/2017  . Palpitations 10/01/2017  . Paroxysmal atrial fibrillation ([place] 03/29/2017  . Paroxysmal atrial fibrillation ([place] 03/29/2017  . Pleural effusion, bilateral 08/09/2017  . Post-nasal drainage 02/25/2016   Last Assessment & Plan:  Trial zyrtec and flonase  . Prostate cancer screening 06/20/2017   Last Assessment & Plan:  Recommend continued annual CaP screening until within 10 years of life expectancy. Given good health and fhx of longevity, would anticipate CaP screening to continue until age 89  PSA  today and again in one year on day of visit.  . Pulmonary hypertension ([place]) 08/09/2017  . Pulmonary nodules 06/12/2016  . S/P AVR (aortic valve replacement) 03/20/2016  . Supratherapeutic INR 07/26/2017  . Syncope 03/29/2017  . Syncope and collapse 02/01/2016  . Typical atrial flutter ([place]) 02/01/2016    Past Surgical History:  Procedure Laterality Date  . CHOLECYSTECTOMY    . CORONARY ARTERY BYPASS GRAFT    . EXPLORATORY LAPAROTOMY  07/30/2017  . FOOT SURGERY    . FRACTURE SURGERY Right    wrist and forearm  . HERNIA REPAIR    . MECHANICAL AORTIC VALVE REPLACEMENT    . NASAL SINUS SURGERY    . RIGHT HEART CATH N/A 01/18/2018   Procedure: RIGHT HEART CATH;  Surgeon: Jolaine Artist, MD;  Location: [facility];  Service: Cardiovascular;  Laterality: N/A;  . VASECTOMY      Current Medications: No outpatient  medications have been marked as taking for the 01/25/18 encounter (Office Visit) with Richardo Priest, MD.     Allergies:   Amoxicillin-pot clavulanate and Tape   Social History   Socioeconomic History  . Marital status: Single    Spouse name: None  . Number of children: None  . Years of education: None  . Highest education level: None  Social Needs  . Financial resource strain: None  . Food insecurity - worry: None  . Food insecurity - inability: None  . Transportation needs - medical: None  . Transportation needs - non-medical: None  Occupational History  . None  Tobacco Use  . Smoking status: Former Smoker    Packs/day: 2.00    Years: 34.00    Pack years: 68.00    Types: Cigarettes    Last attempt to quit: 07/16/2000    Years since quitting: 17.5  . Smokeless tobacco: Never Used  Substance and Sexual Activity  . Alcohol use: Yes  . Drug use: No  . Sexual activity: None  Other Topics Concern  . None  Social History Narrative  . None     Family History: The patient's family history includes Arthritis in his mother; Asthma in his mother; Heart attack in his father; Hypertension in his father; Stroke in his paternal grandmother. ROS:   Please see the history of present illness.    All other systems reviewed and are negative.  EKGs/Labs/Other Studies Reviewed:    The following studies were reviewed today:  Cardiac MRI: IMPRESSION: 1.  Normal LV size with mild diffuse hypokinesis, EF 48%. 2. Moderately dilated RV with severely decreased systolic function, EF 94%. D-shaped interventricular septum suggests RV pressure/volume overload. 3.  Mechanical aortic valve. 4.  Moderate to severe biatrial enlargement. 5. Difficult images, but probably no LGE. Therefore, no definitive evidence for prior MI, infiltrative disease, or myocarditis. 6. No significant respirophasic variation of the interventricular septum. The septum is stably left-shifted. No definite evidence  for pericardial constriction from this study.  Recent Labs: 01/09/2018: BNP 668.0 01/18/2018: Hemoglobin 15.9; Platelets 232 01/21/2018: BUN 28; Creatinine, Ser 1.58; Potassium 3.6; Sodium 137  Recent Lipid Panel No results found for: CHOL, TRIG, HDL, CHOLHDL, VLDL, LDLCALC, LDLDIRECT  Physical Exam:    VS:  BP (!) 130/100 (BP Location: Left Arm, Patient Position: Sitting, Cuff Size: Normal)   Pulse (!) 123   Ht _0  (1.753 m)   Wt 161 lb (73 kg)   SpO2 99%   BMI 23.78 kg/m     Wt Readings from  Last 3 Encounters:  01/25/18 161 lb (73 kg)  01/18/18 153 lb (69.4 kg)  01/11/18 154 lb (69.9 kg)     GEN: In pain passing kidney stones Well nourished, well developed in no acute distress HEENT: Normal NECK: mild  JVD; No carotid bruits icteric LYMPHATICS: No lymphadenopathy CARDIAC: =Irr Irr variable S1 2/6 SEM no AR no murmurs, rubs, gallops RESPIRATORY:  Clear to auscultation without rales, wheezing or rhonchi  ABDOMEN: Soft, non-tender, non-distended MUSCULOSKELETAL:  1-2+ edema; No deformity  SKIN: Warm and dry NEUROLOGIC:  Alert and oriented x 3 PSYCHIATRIC:  Normal affect    Signed, Shirlee More, MD  01/25/2018 3:55 PM    [facility]

## 2018-01-25 ENCOUNTER — Telehealth (HOSPITAL_COMMUNITY): Payer: Self-pay | Admitting: Pharmacist

## 2018-01-25 ENCOUNTER — Ambulatory Visit (INDEPENDENT_AMBULATORY_CARE_PROVIDER_SITE_OTHER): Payer: Medicare Other | Admitting: Cardiology

## 2018-01-25 ENCOUNTER — Encounter: Payer: Self-pay | Admitting: Cardiology

## 2018-01-25 VITALS — BP 130/100 | HR 123 | Ht 69.0 in | Wt 161.0 lb

## 2018-01-25 DIAGNOSIS — I4892 Unspecified atrial flutter: Secondary | ICD-10-CM | POA: Diagnosis not present

## 2018-01-25 DIAGNOSIS — Z7901 Long term (current) use of anticoagulants: Secondary | ICD-10-CM

## 2018-01-25 DIAGNOSIS — Z5181 Encounter for therapeutic drug level monitoring: Secondary | ICD-10-CM

## 2018-01-25 DIAGNOSIS — I110 Hypertensive heart disease with heart failure: Secondary | ICD-10-CM

## 2018-01-25 DIAGNOSIS — Z952 Presence of prosthetic heart valve: Secondary | ICD-10-CM | POA: Diagnosis not present

## 2018-01-25 DIAGNOSIS — I272 Pulmonary hypertension, unspecified: Secondary | ICD-10-CM | POA: Diagnosis not present

## 2018-01-25 DIAGNOSIS — I25119 Atherosclerotic heart disease of native coronary artery with unspecified angina pectoris: Secondary | ICD-10-CM | POA: Diagnosis not present

## 2018-01-25 DIAGNOSIS — I5032 Chronic diastolic (congestive) heart failure: Secondary | ICD-10-CM

## 2018-01-25 MED ORDER — TADALAFIL (PAH) 20 MG PO TABS: 40.0000 mg | ORAL_TABLET | Freq: Every day | ORAL | 11 refills | Status: DC

## 2018-01-25 NOTE — Patient Instructions (Addendum)
Medication Instructions:   Take 2.66m of warfarin today Take 5 mg of warfarin on Saturday  Labwork:  [facility]  Testing/Procedures:  None  Follow-Up:  See Dr BHaroldine Lawsfor [place] [facility]  Any Other Special Instructions Will Be Listed Below (If Applicable).     If you need a refill on your cardiac medications before your next appointment, please call your pharmacy.

## 2018-01-25 NOTE — Patient Instructions (Addendum)
Medication Instructions:   Take 2.80m of warfarin Today Take 56mof warfarin on Saturday  Cont. With lovenox injections twice daily.  No Lovenox injections on Sunday morning.   Labwork:  [facility].    Testing/Procedures:  None  Follow-Up:  Follow up with Dr BeSung Amabiles directed by Dr MuBettina Gavia  Any Other Special Instructions Will Be Listed Below (If Applicable).     If you need a refill on your cardiac medications before your next appointment, please call your pharmacy.

## 2018-01-25 NOTE — Telephone Encounter (Signed)
Tadalafil PA approved by [employer] Part D through 01/24/21.   Ruta Hinds. Velva Harman, PharmD, BCPS, CPP Clinical Pharmacist Phone: [phone] 01/25/2018 10:10 AM

## 2018-01-27 DIAGNOSIS — I4892 Unspecified atrial flutter: Secondary | ICD-10-CM | POA: Diagnosis not present

## 2018-01-27 DIAGNOSIS — Z952 Presence of prosthetic heart valve: Secondary | ICD-10-CM | POA: Diagnosis not present

## 2018-01-27 DIAGNOSIS — Z5181 Encounter for therapeutic drug level monitoring: Secondary | ICD-10-CM | POA: Diagnosis not present

## 2018-01-27 LAB — PROTIME-INR

## 2018-01-29 ENCOUNTER — Telehealth: Payer: Self-pay

## 2018-01-29 ENCOUNTER — Other Ambulatory Visit: Payer: Self-pay | Admitting: Pharmacist

## 2018-01-29 DIAGNOSIS — Z7901 Long term (current) use of anticoagulants: Secondary | ICD-10-CM

## 2018-01-29 DIAGNOSIS — I4892 Unspecified atrial flutter: Secondary | ICD-10-CM

## 2018-01-29 DIAGNOSIS — J449 Chronic obstructive pulmonary disease, unspecified: Secondary | ICD-10-CM | POA: Diagnosis not present

## 2018-01-29 DIAGNOSIS — R0902 Hypoxemia: Secondary | ICD-10-CM | POA: Diagnosis not present

## 2018-01-29 NOTE — Telephone Encounter (Signed)
Left message to return call regarding if patient has a home monitor to check his Coumadin or if he needs to come to the office.

## 2018-01-30 ENCOUNTER — Telehealth: Payer: Self-pay | Admitting: Cardiology

## 2018-01-30 DIAGNOSIS — I2699 Other pulmonary embolism without acute cor pulmonale: Secondary | ICD-10-CM | POA: Diagnosis not present

## 2018-01-30 NOTE — Telephone Encounter (Signed)
Lance Hicks called and asked if his PCP can do his coumadin check and fax the results since he is 47 miles away? His appt is for today at 2:00

## 2018-01-30 NOTE — Telephone Encounter (Signed)
Contacted patient regarding his coumadin check for today. Patient states that for right now, it is more convenient for him to have his coumadin checked at his primary care physician's office. Advised that his primary care physician would then manage his coumadin. Patient verbalized understanding. Advised patient that if he wanted to switch to our office managing when we came back to [facility] in July that he could. Patient verbalized understanding. No further questions.

## 2018-01-31 DIAGNOSIS — N200 Calculus of kidney: Secondary | ICD-10-CM | POA: Diagnosis not present

## 2018-02-01 ENCOUNTER — Telehealth (HOSPITAL_COMMUNITY): Payer: Self-pay | Admitting: Pharmacist

## 2018-02-01 NOTE — Telephone Encounter (Signed)
Spoke with Mr. Torrence who stated that he was told that his Adcirca would be >$1000/mo which he cannot afford. [employer] is sending him an assistance application in the mail. If he does not qualify, I have asked him to call me back to see if we need to switch him to sildenafil until other sources of assistance funding are available (I.e. PANF, TAF, etc). He also stated that he found a program that would be able to provide him with Opsumit for $50/mo. I have asked him to let me know if I need to send an Rx for that program.   Doroteo Bradford K. Velva Harman, PharmD, BCPS, CPP Clinical Pharmacist Phone: [phone] 02/01/2018 12:10 PM

## 2018-02-11 ENCOUNTER — Encounter (HOSPITAL_COMMUNITY): Payer: Self-pay | Admitting: Internal Medicine

## 2018-02-11 ENCOUNTER — Ambulatory Visit (HOSPITAL_COMMUNITY)
Admission: RE | Admit: 2018-02-11 | Discharge: 2018-02-11 | Disposition: A | Discharge status: Home / Self Care | Payer: Medicare Other | Source: Ambulatory Visit | Attending: Internal Medicine | Admitting: Internal Medicine

## 2018-02-11 ENCOUNTER — Other Ambulatory Visit: Payer: Self-pay

## 2018-02-11 VITALS — BP 120/54 | HR 79 | Wt 160.5 lb

## 2018-02-11 DIAGNOSIS — Z7982 Long term (current) use of aspirin: Secondary | ICD-10-CM | POA: Diagnosis not present

## 2018-02-11 DIAGNOSIS — J449 Chronic obstructive pulmonary disease, unspecified: Secondary | ICD-10-CM | POA: Insufficient documentation

## 2018-02-11 DIAGNOSIS — I480 Paroxysmal atrial fibrillation: Secondary | ICD-10-CM | POA: Diagnosis not present

## 2018-02-11 DIAGNOSIS — Z952 Presence of prosthetic heart valve: Secondary | ICD-10-CM | POA: Insufficient documentation

## 2018-02-11 DIAGNOSIS — I25119 Atherosclerotic heart disease of native coronary artery with unspecified angina pectoris: Secondary | ICD-10-CM | POA: Insufficient documentation

## 2018-02-11 DIAGNOSIS — R0609 Other forms of dyspnea: Secondary | ICD-10-CM

## 2018-02-11 DIAGNOSIS — I350 Nonrheumatic aortic (valve) stenosis: Secondary | ICD-10-CM | POA: Diagnosis not present

## 2018-02-11 DIAGNOSIS — J9610 Chronic respiratory failure, unspecified whether with hypoxia or hypercapnia: Secondary | ICD-10-CM | POA: Diagnosis not present

## 2018-02-11 DIAGNOSIS — Z79899 Other long term (current) drug therapy: Secondary | ICD-10-CM | POA: Diagnosis not present

## 2018-02-11 DIAGNOSIS — Z8261 Family history of arthritis: Secondary | ICD-10-CM | POA: Insufficient documentation

## 2018-02-11 DIAGNOSIS — M5416 Radiculopathy, lumbar region: Secondary | ICD-10-CM | POA: Insufficient documentation

## 2018-02-11 DIAGNOSIS — F17210 Nicotine dependence, cigarettes, uncomplicated: Secondary | ICD-10-CM | POA: Insufficient documentation

## 2018-02-11 DIAGNOSIS — Z9889 Other specified postprocedural states: Secondary | ICD-10-CM | POA: Diagnosis not present

## 2018-02-11 DIAGNOSIS — Z825 Family history of asthma and other chronic lower respiratory diseases: Secondary | ICD-10-CM | POA: Insufficient documentation

## 2018-02-11 DIAGNOSIS — Z9049 Acquired absence of other specified parts of digestive tract: Secondary | ICD-10-CM | POA: Insufficient documentation

## 2018-02-11 DIAGNOSIS — I482 Chronic atrial fibrillation: Secondary | ICD-10-CM | POA: Diagnosis not present

## 2018-02-11 DIAGNOSIS — J90 Pleural effusion, not elsewhere classified: Secondary | ICD-10-CM | POA: Diagnosis not present

## 2018-02-11 DIAGNOSIS — I119 Hypertensive heart disease without heart failure: Secondary | ICD-10-CM | POA: Insufficient documentation

## 2018-02-11 DIAGNOSIS — R600 Localized edema: Secondary | ICD-10-CM | POA: Diagnosis not present

## 2018-02-11 DIAGNOSIS — E7849 Other hyperlipidemia: Secondary | ICD-10-CM | POA: Insufficient documentation

## 2018-02-11 DIAGNOSIS — I272 Pulmonary hypertension, unspecified: Secondary | ICD-10-CM | POA: Diagnosis not present

## 2018-02-11 DIAGNOSIS — M310 Hypersensitivity angiitis: Secondary | ICD-10-CM | POA: Diagnosis not present

## 2018-02-11 DIAGNOSIS — Z7901 Long term (current) use of anticoagulants: Secondary | ICD-10-CM | POA: Insufficient documentation

## 2018-02-11 DIAGNOSIS — Z91048 Other nonmedicinal substance allergy status: Secondary | ICD-10-CM | POA: Insufficient documentation

## 2018-02-11 DIAGNOSIS — I483 Typical atrial flutter: Secondary | ICD-10-CM | POA: Diagnosis not present

## 2018-02-11 DIAGNOSIS — Z8249 Family history of ischemic heart disease and other diseases of the circulatory system: Secondary | ICD-10-CM | POA: Insufficient documentation

## 2018-02-11 DIAGNOSIS — E8770 Fluid overload, unspecified: Secondary | ICD-10-CM | POA: Insufficient documentation

## 2018-02-11 DIAGNOSIS — I2781 Cor pulmonale (chronic): Secondary | ICD-10-CM

## 2018-02-11 DIAGNOSIS — Z823 Family history of stroke: Secondary | ICD-10-CM | POA: Insufficient documentation

## 2018-02-11 DIAGNOSIS — F419 Anxiety disorder, unspecified: Secondary | ICD-10-CM | POA: Diagnosis not present

## 2018-02-11 DIAGNOSIS — Z951 Presence of aortocoronary bypass graft: Secondary | ICD-10-CM | POA: Insufficient documentation

## 2018-02-11 DIAGNOSIS — R0600 Dyspnea, unspecified: Secondary | ICD-10-CM

## 2018-02-11 DIAGNOSIS — Z880 Allergy status to penicillin: Secondary | ICD-10-CM | POA: Insufficient documentation

## 2018-02-11 LAB — BASIC METABOLIC PANEL
Anion gap: 11 (ref 5–15)
BUN: 18 mg/dL (ref 6–20)
CO2: 21 mmol/L — ABNORMAL LOW (ref 22–32)
Calcium: 9 mg/dL (ref 8.9–10.3)
Chloride: 106 mmol/L (ref 101–111)
Creatinine, Ser: 1.11 mg/dL (ref 0.61–1.24)
GFR calc Af Amer: >60 mL/min (ref >60)
GFR calc non Af Amer: >60 mL/min (ref >60)
Glucose, Bld: 76 mg/dL (ref 65–99)
Potassium: 2.9 mmol/L — ABNORMAL LOW (ref 3.5–5.1)
Sodium: 138 mmol/L (ref 135–145)

## 2018-02-11 MED ORDER — SILDENAFIL CITRATE 20 MG PO TABS: 20.0000 mg | ORAL_TABLET | Freq: Three times a day (TID) | ORAL | 0 refills | Status: DC

## 2018-02-11 NOTE — Progress Notes (Signed)
ADVANCED HF CLINIC NOTE   Date:  02/11/2018   ID:  Lance Hicks, DOB 1946/07/18, MRN [id number]  PCP:  Lance Roger, MD  Cardiologist:  Dr. 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 Also has COPD (quit smoking in 2002 - 1 ppd x 25 years), HL, HTN referred by Dr. Bettina Hicks for further evaluation of RV failure  He moved from [place] about 2 years ago and has been followed by Pulmonary and Cardiology art [facility]. (I have reviewed these records in [place])  In 8/18 admitted for acute pancreatitis. Underwent lap cholecystectomy on 07/29/2017. On 8/6 hedeveloped hypotension and was taken back for open laparotomy due to bleeding from an unclear omental source.He was given fluids and 8 units PRBC perioperatively. He says his breathing has been bad since that time.   Subsequently saw pulmonogist as [place]. Ha hi-res CT, PFTs and VQ (low prob). Felt to have Golds I COPD with asthma overlap and early pulmonary fibrosis with PAH. Thought felt pulmonary pressures may have been elevated due to recent illness. Referred to Rheumatology though appt not until May. Suggested possible RHC.   He was also seen by Cardiology. Echocardiogram 8/18 as below showed normal LV function with septal flattening and RVSP 65-67mHG. There was no comment on RV size or function. 14-day event monitor showed 10-beat run NSVT. Felt to have chronic AF and PAH. No further testing ordered.  Underwent RHC on 01/18/18 with severe PAH and cor pulmonale. Macitentan and Adcirca. Has started macitentan as he got the first 30 days approved. Has not been able to afford tadalafil or the f/u prescription for macitentan. Says he feels worse today. Still walking on the treadmill and can do that  without too much problem - sats 87-94% on TM. However gets SOB with ADLs and very SOB when standing up from a chair. Check sats while working in barn 70-78%. Says he gets a tightness in his chest and can't breathe. Does not feel like angina. + LE edema. Wore ONOX and sats dropped into 60s. Average 86%  Studies:   RHC 01/18/18 RA = 23 RV = 84/21 PA = 81/38 (53) PCW = 22 Fick cardiac output/index = 3.0/1.6 PVR = 10.4 WU FA sat = 98% on 2L  (checked on RA = 93%) PA sat = 60%, 61% SVC sat = 62%   Echo 8/18  1. LVEF is estimated to be 55%. 2. There is septal flattening consistent with right ventricular pressure and or volume overload. 3. A mechanical prosthetic aortic valve is present with mild AI 4. There is moderate to severe tricuspid regurgitation. 5. There is a trivial amount of pulmonic regurgitation. 6. The right ventricular systolic pressure is calculated at 65-75mg. 7. The inferior vena cava is dilated with <50% inspiratory collapse, suggestive of an elevated right atrial pressure (1513m).  14 Day Patch 10/01/2017-10/15/2017:  -1 run of Ventricular Tachycardia occurred lasting 10 beats with a max rate of 240 bpm (avg 161 bpm). -Atrial Flutter occurred continuously (100% burden), ranging from 55-132 bpm (avg of 87 bpm).   NM Ventilation and Perfusion Lung Scan 10/31/2017:  Low probability of pulmonary embolism, using the PIOPED criteria.  Lower extremity venous duplex 07/25/2017:  1. Low  probability of acute deep vein thrombosis in bilateral lower extremities. 2. Calf veins are poorly visualized but appear patent in limited segments visualized.   PFTs 07/02/2017:  FVC86% FEV185% FEV1/FVC 73% DLCO8.2, 32%   6MW -1400 feet; 97% to 85% (recovered with resting)   CT Chest07/08/2017: 1. Stable right middle lobe pulmonary nodule measuring up to 1 cm. 2. Minimal subpleural reticulation without evidence of  honeycombing to  suggest pulmonary fibrosis. Stable centrilobular emphysema. 3. Mediastinal lymphadenopathy, increased from prior. 4. Calcified right hilar lymph nodes and multiple calcified granulomas  throughout the right lung consistent with prior granulomatous disease. 5. Ascending thoracic aortic aneurysm measuring up to 4.5 cm, unchanged. 6. Dilatation of the main pulmonary artery suggestive of pulmonary  hypertension. 7. Cardiomegaly and dense atherosclerotic coronary artery calcifications.    Past Medical History:  Diagnosis Date  . 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 In light of concurrent left nephrolithiasis, will check CT renal colic next year instead of renal US.   [name] Anticoagulated on Coumadin 01/04/2018  . Anxiety 05/10/2016   Last Assessment & Plan:  Doing well off of zoloft.  . Asymptomatic microscopic hematuria 06/20/2017   Last Assessment & Plan:  Had hematuria workup in [facility] in 2016 which negative CT and cystoscopy. UA with 2+ blood last visit - we discussed recommendation for repeat workup at 5 years or if degree of hematuria progresses.   . Atrial fibrillation ([place]) 03/29/2017  . Atrial flutter ([place]) 03/29/2017  . Chronic allergic rhinitis 04/28/2016   Last Assessment & Plan:  Continue astelin  . Chronic anticoagulation 03/29/2017  . Chronic atrial fibrillation ([place]) 07/23/2015   Last Assessment & Plan:  Coumadin and metoprolol, cardiology referral to establish care.  . Chronic midline back pain 12/14/2015   Last Assessment & Plan:  Pain management referral for further evaluation.  . Chronic prostatitis 07/23/2015   Last Assessment & Plan:  Has largely resolved since stopping bike riding. Recommend annual DRE AND PSA - will see back 12/2015 for annual screening, given 1st degree fhx. To call office for recurrent prostatitis symptoms.   [name] COPD (chronic obstructive pulmonary disease) ([place]) 06/12/2016  . Coronary  artery disease involving native coronary artery of native heart with angina pectoris ([place]) 03/29/2017  . Cough 10/17/2016   Last Assessment & Plan:  Discussed typical course for acute viral illness. If symptoms worsen or fail to improve by 7-10d, delayed ATBs, fluids, rest, NSAIDs/APAP prn. Seek care if not improving. Needs earlier INR check due to ATBs.  [name] Dyspnea 02/01/2016   Last Assessment & Plan:  Overall improving, eval by pulm, plan for CT, neg stress test with cardiology. Recent switch to carvedilol due to side effects.  . Epidermoid cyst of skin 08/24/2017  . Essential hypertension 12/14/2015   Last Assessment & Plan:  Hypertension control: controlled  Medications: compliant Medication Management: as noted in orders Home blood pressure monitoring recommended additionally as needed for symptoms  The patient's care plan was reviewed and updated. Instructions and counseling were provided regarding patient goals and barriers. He was counseled to adopt a healthy lifestyle. Educational resources and self-management tools have been provided as charted in [facility] list.   . H/O maze procedure 03/29/2017  . [name] 03/29/2017   Overview:  2011  . Hx of CABG 03/29/2017  . Hyperlipidemia 03/29/2017  . Hypertensive heart disease 03/29/2017  . Kidney stones 07/23/2015   Overview:  x 3  Last Assessment & Plan:  By [place] has left nephrolithiasis, but not visible by KUB. Will check CT renal colic next year to assess both stone burden as well as to surveil AML.   . Leukocytoclastic vasculitis ([place]) 10/01/2017  . Localized edema 01/04/2018  . Lumbar radicular pain 01/19/2016  . Maculopapular rash 09/03/2017  . Nephrolithiasis 07/23/2015   Overview:  x 3  Last Assessment & Plan:  By [place] has left nephrolithiasis, but not visible by KUB. Will check CT renal colic next year to assess both stone burden as well as to surveil AML.   Overview:  x 3  Last Assessment & Plan:  Has 40m nonobstructing LUP stone -  not visible by KUB.  Will check renal [place] - he will contact office sooner if symptomatic.   . Non-sustained ventricular tachycardia ([place] 03/29/2017  . Other hyperlipidemia 03/29/2017  . Palpitations 10/01/2017  . Paroxysmal atrial fibrillation ([place] 03/29/2017  . Paroxysmal atrial fibrillation ([place] 03/29/2017  . Pleural effusion, bilateral 08/09/2017  . Post-nasal drainage 02/25/2016   Last Assessment & Plan:  Trial zyrtec and flonase  . Prostate cancer screening 06/20/2017   Last Assessment & Plan:  Recommend continued annual CaP screening until within 10 years of life expectancy. Given good health and fhx of longevity, would anticipate CaP screening to continue until age 89  PSA today and again in one year on day of visit.  . Pulmonary hypertension ([place] 08/09/2017  . Pulmonary nodules 06/12/2016  . S/P AVR (aortic valve replacement) 03/20/2016  . Supratherapeutic INR 07/26/2017  . Syncope 03/29/2017  . Syncope and collapse 02/01/2016  . Typical atrial flutter ([place] Torres 02/01/2016    Past Surgical History:  Procedure Laterality Date  . CHOLECYSTECTOMY    . CORONARY ARTERY BYPASS GRAFT    . EXPLORATORY LAPAROTOMY  07/30/2017  . FOOT SURGERY    . FRACTURE SURGERY Right    wrist and forearm  . HERNIA REPAIR    . MECHANICAL AORTIC VALVE REPLACEMENT    . NASAL SINUS SURGERY    . RIGHT HEART CATH N/A 01/18/2018   Procedure: RIGHT HEART CATH;  Surgeon: BJolaine Artist MD;  [facility];  Service: Cardiovascular;  Laterality: N/A;  . VASECTOMY      Current Medications: Current Meds  Medication Sig  . acetaminophen (TYLENOL) 650 MG CR tablet Take 650 mg by mouth every 8 (eight) hours as needed for pain.  .Marland Kitchenaspirin 81 MG chewable tablet Chew 81 mg by mouth every morning.   . B Complex-C (B-COMPLEX WITH VITAMIN C) tablet Take by mouth.  . budesonide (PULMICORT) 0.5 MG/2ML nebulizer solution Take 0.5 mg by nebulization daily as needed (for shortness of breath or wheezing).   .  cetirizine (ZYRTEC ALLERGY) 10 MG tablet Take 10 mg by mouth daily as needed for allergies.   . Coenzyme Q10 (CO Q 10 PO) Take 1 capsule by mouth every other day.  . furosemide (LASIX) 20 MG tablet Take 20 mg by mouth every other day.  .Marland KitchenHYDROcodone-acetaminophen (NORCO/VICODIN) 5-325 MG tablet Take 1 tablet by mouth every 6 (six) hours as needed for moderate pain.  . Liniments (SALONPAS EX) Apply 1 patch topically daily.  . Macitentan (OPSUMIT) 10 MG TABS Take 10 mg by mouth daily.  . metoprolol tartrate (LOPRESSOR) 25 MG tablet Take 25 mg by mouth 2 (two) times daily.   . Multiple Vitamins-Minerals (MULTIVITAMIN ADULTS PO) Take 1 tablet by mouth daily.  .Marland Kitchenspironolactone (  ALDACTONE) 25 MG tablet Take 0.5 tablets (12.5 mg total) by mouth daily.  . TUDORZA PRESSAIR 400 MCG/ACT AEPB Inhale 1 puff into the lungs 2 (two) times daily as needed (for shortness of breath or wheezing).   . warfarin (COUMADIN) 5 MG tablet Take 2.5-5 mg by mouth See admin instructions. Take 5 mg by mouth daily on Saturday. Take 2.5 mg by mouth daily on all other days     Allergies:   Amoxicillin-pot clavulanate and Tape   Social History   Socioeconomic History  . Marital status: Single    Spouse name: None  . Number of children: None  . Years of education: None  . Highest education level: None  Social Needs  . Financial resource strain: None  . Food insecurity - worry: None  . Food insecurity - inability: None  . Transportation needs - medical: None  . Transportation needs - non-medical: None  Occupational History  . None  Tobacco Use  . Smoking status: Former Smoker    Packs/day: 2.00    Years: 34.00    Pack years: 68.00    Types: Cigarettes    Last attempt to quit: 07/16/2000    Years since quitting: 17.5  . Smokeless tobacco: Never Used  Substance and Sexual Activity  . Alcohol use: Yes  . Drug use: No  . Sexual activity: None  Other Topics Concern  . None  Social History Narrative  . None      Family History: The patient's family history includes Arthritis in his mother; Asthma in his mother; Heart attack in his father; Hypertension in his father; Stroke in his paternal grandmother.   Recent Labs: 01/09/2018: BNP 668.0 01/18/2018: Hemoglobin 15.9; Platelets 232 01/21/2018: BUN 28; Creatinine, Ser 1.58; Potassium 3.6; Sodium 137  Recent Lipid Panel No results found for: CHOL, TRIG, HDL, CHOLHDL, VLDL, LDLCALC, LDLDIRECT  Physical Exam:    VS:  BP (!) 120/54   Pulse 79   Wt 160 lb 8 oz (72.8 kg)   SpO2 98%   BMI 23.70 kg/m     Wt Readings from Last 3 Encounters:  02/11/18 160 lb 8 oz (72.8 kg)  01/25/18 161 lb (73 kg)  01/18/18 153 lb (69.4 kg)    General:  Fatigued appearing. Cyanotic. No resp difficulty HEENT: normal Neck: supple. JVP 10. Carotids 2+ bilat; no bruits. No lymphadenopathy or thryomegaly appreciated. Cor: PMI nondisplaced. + RV lift. Irregular 2/6 TR Loud P2 Lungs: clear Abdomen: soft, nontender, nondistended. No hepatosplenomegaly. No bruits or masses. Good bowel sounds. Extremities: + cyanosis, clubbing, no  Rash, 2+  edema Neuro: alert & orientedx3, cranial nerves grossly intact. moves all 4 extremities w/o difficulty. Affect pleasant   ECG Atrial flutter 85 with RVH and RV strain   ASSESSMENT/PLAN:    1. Pulmonary hypertension with cor pulmonale - RHC data reviewed with him. He has moderate to severey pulmonary HTN with RV failure and volume overload - etiology remains unclear. Although he was a smoker and has evidence of cyanosis on exam (and with O2 monitoring at home/ONOX), his hi-RES CT and PFTs (normal spirometry and markedly reduced DLCO) argue against significant underlying parenchymal lung disease. I suspect his hypoxia is likely secondary to his PH. He has also had negative VQ making CTEPH unlikely - he has worsening symptoms (now NYHA III-IIIB) despite macitentan initiation. He cannot afford Adcirca so will switch to sildenafil 20  tid.  - Given his hemodynamics and rapidly worsening symptoms, I think the best plan is  to refer him for IV prostanoid therapy. I have called Dr. Gilles Chiquito at [facility] and she will arrange to see him. Hopefully we may be able to wean him back to orals at some point.  - Encouraged him to increase lasix for volume overload - Home O2 ordered - Serologies to date have been negative. Rheum appointment pending   2. Chronic respiratory failure - plan as above  3. Chronic AFL - rate controlled on lopressor. Continue coumadin with mechanical AVR  4. CAD - s/p CABG.  - stable no s/s angina - continue ASA. Will need statin  5. S/p mechanical AVR - stable on echo. Continue coumadin/ASA 81 - aware of need for SBE prophylaxis  6. Leukocytoclastic vasculitis - f/u with Rheumatology  Total time spent 45 minutes. Over half that time spent discussing above.    Glori Bickers, MD  3:47 PM

## 2018-02-11 NOTE — Patient Instructions (Signed)
START Sildenafil 17m (1 tablet) three times daily.   Routine lab work today. Will notify you of abnormal results  Your provider has ordered home oxygen for day and night time use. They will contact you to set up.

## 2018-02-12 LAB — MPO/PR-3 (ANCA) ANTIBODIES
ANCA Proteinase 3: <3.5 U/mL (ref 0.0–3.5)
Myeloperoxidase Abs: <9 U/mL (ref 0.0–9.0)

## 2018-02-12 LAB — ANA W/REFLEX: Anti Nuclear Antibody(ANA): NEGATIVE

## 2018-02-13 ENCOUNTER — Telehealth (HOSPITAL_COMMUNITY): Payer: Self-pay | Admitting: Pharmacist

## 2018-02-13 MED ORDER — TADALAFIL (PAH) 20 MG PO TABS: 40.0000 mg | ORAL_TABLET | Freq: Every day | ORAL | 11 refills | Status: DC

## 2018-02-13 NOTE — Telephone Encounter (Signed)
Opsumit PA approved by [employer] through 02/13/19.   Ruta Hinds. Velva Harman, PharmD, BCPS, CPP Clinical Pharmacist Phone: [phone] 02/13/2018 11:12 AM

## 2018-02-13 NOTE — Telephone Encounter (Signed)
Patient called stating he was approved for a grant for both Opsumit and tadalafil through 02/13/19. Have advised him that once he finished the sildenafil he already picked up, he can switch to the tadalafil.   Ruta Hinds. Velva Harman, PharmD, BCPS, CPP Clinical Pharmacist Phone: [phone] 02/13/2018 1:32 PM

## 2018-02-15 ENCOUNTER — Telehealth (HOSPITAL_COMMUNITY): Payer: Self-pay | Admitting: Cardiology

## 2018-02-15 NOTE — Progress Notes (Signed)
SATURATION QUALIFICATIONS: (This note is used to comply with regulatory documentation for home oxygen)  Patient Saturations on Room Air at Rest = 82%  Patient Saturations on Room Air while Ambulating = 82%  Patient Saturations on 2 Liters of oxygen while Ambulating = 99%  Please briefly explain why patient needs home oxygen: CHF desaturation while ambulating, please add oxygen concentrator for use while ambulating.

## 2018-02-15 NOTE — Addendum Note (Signed)
Encounter addended by: Kerry Dory, CMA on: 02/15/2018 2:40 PM  Actions taken: Sign clinical note, Visit diagnoses modified, Order list changed, Diagnosis association updated

## 2018-02-15 NOTE — Telephone Encounter (Signed)
Patient called to check status of home o2 order that was ordered during [place] on 02/11/18.  Order is not in [place], no pending referrals per [facility] will place order.[name]

## 2018-02-18 DIAGNOSIS — E876 Hypokalemia: Secondary | ICD-10-CM | POA: Insufficient documentation

## 2018-02-18 HISTORY — DX: Hypokalemia: E87.6

## 2018-02-18 NOTE — Progress Notes (Signed)
Cardiology Office Note:    Date:  02/20/2018   ID:  Lance Hicks, DOB Dec 07, 1946, MRN [id number]  PCP:  Townsend Roger, MD  Cardiologist:  Shirlee More, MD    Referring MD: Townsend Roger, MD    ASSESSMENT:    1. Atrial flutter, unspecified type ([place])   2. Hypokalemia   3. Anticoagulated on Coumadin   4. [name]   5. PAH (pulmonary artery hypertension) ([place])   6. Hypertensive heart disease with heart failure ([place])   7. Chronic obstructive pulmonary disease, unspecified COPD type ([place])    PLAN:    In order of problems listed above:  1. Stable remain anticoagulated with mechanical AVR in atrial flutter 2. Recheck BMP 3. INR today goal 3 4. Stable function 5. Severe with right heart failure on appropriate therapy 6. Stable continue low-dose diuretic and avoid hypotension 7. Stable await home oxygen to be set up   Next appointment: 3 months   Medication Adjustments/Labs and Tests Ordered: Current medicines are reviewed at length with the patient today.  Concerns regarding medicines are outlined above.  No orders of the defined types were placed in this encounter.  No orders of the defined types were placed in this encounter.   Chief Complaint  Patient presents with  . Follow-up    6 week flup appt, with c/o SHOB, edema, and fatigue    History of Present Illness:    Lance Hicks is a 72 y.o. male with a hx of CAD, Atrial Fibrillationwith Maze procedure and LAA excision, Dyslipidemia, HTN, S/P CABG, Valvular heart disease with#25 CarbomedicsAVR in 2010, Syncopewith nonsustained VT, COPD, pulmonary fibrosis and nodule.Lance KitchenRecent right heart cath showed severe PAH and started on combined therapy with Macitnentan and Adcirca. He  was last seen 01/25/18.Coumadin is now managed in our clinic .Compliance with diet, lifestyle and medications: Yes He is a little tearful and frustrated today he had oxygen delivered to his home but they left  in the large medical tank did not give a concentrator in the shoulder back he will contact the social worker at the heart failure program for assistance.  He still is exercising he has exertional shortness of breath but not severe limiting and continues to have peripheral edema.  He is now taking medications for his idiopathic pulmonary artery hypertension.  He said no chest pain palpitation syncope or GU bleeding.  Warfarin is managed through the heart failure program. Past Medical History:  Diagnosis Date  . 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 In light of concurrent left nephrolithiasis, will check CT renal colic next year instead of renal US.   Lance Hicks Anticoagulated on Coumadin 01/04/2018  . Anxiety 05/10/2016   Last Assessment & Plan:  Doing well off of zoloft.  . Asymptomatic microscopic hematuria 06/20/2017   Last Assessment & Plan:  Had hematuria workup in [facility] in 2016 which negative CT and cystoscopy. UA with 2+ blood last visit - we discussed recommendation for repeat workup at 5 years or if degree of hematuria progresses.   . Atrial fibrillation ([place]) 03/29/2017  . Atrial flutter ([place]) 03/29/2017  . Chronic allergic rhinitis 04/28/2016   Last Assessment & Plan:  Continue astelin  . Chronic anticoagulation 03/29/2017  . Chronic atrial fibrillation ([place]) 07/23/2015   Last Assessment & Plan:  Coumadin and metoprolol, cardiology referral to establish care.  . Chronic midline back pain 12/14/2015   Last Assessment &  Plan:  Pain management referral for further evaluation.  . Chronic prostatitis 07/23/2015   Last Assessment & Plan:  Has largely resolved since stopping bike riding. Recommend annual DRE AND PSA - will see back 12/2015 for annual screening, given 1st degree fhx. To call office for recurrent prostatitis symptoms.   Lance Hicks COPD (chronic obstructive pulmonary disease) ([place]) 06/12/2016  . Coronary artery disease involving native coronary  artery of native heart with angina pectoris ([place]) 03/29/2017  . Cough 10/17/2016   Last Assessment & Plan:  Discussed typical course for acute viral illness. If symptoms worsen or fail to improve by 7-10d, delayed ATBs, fluids, rest, NSAIDs/APAP prn. Seek care if not improving. Needs earlier INR check due to ATBs.  Lance Hicks Dyspnea 02/01/2016   Last Assessment & Plan:  Overall improving, eval by pulm, plan for CT, neg stress test with cardiology. Recent switch to carvedilol due to side effects.  . Epidermoid cyst of skin 08/24/2017  . Essential hypertension 12/14/2015   Last Assessment & Plan:  Hypertension control: controlled  Medications: compliant Medication Management: as noted in orders Home blood pressure monitoring recommended additionally as needed for symptoms  The patient's care plan was reviewed and updated. Instructions and counseling were provided regarding patient goals and barriers. He was counseled to adopt a healthy lifestyle. Educational resources and self-management tools have been provided as charted in [facility] list.   . H/O maze procedure 03/29/2017  . [name] 03/29/2017   Overview:  2011  . Hx of CABG 03/29/2017  . Hyperlipidemia 03/29/2017  . Hypertensive heart disease 03/29/2017  . Kidney stones 07/23/2015   Overview:  x 3  Last Assessment & Plan:  By [place] has left nephrolithiasis, but not visible by KUB. Will check CT renal colic next year to assess both stone burden as well as to surveil AML.   . Leukocytoclastic vasculitis ([place]) 10/01/2017  . Localized edema 01/04/2018  . Lumbar radicular pain 01/19/2016  . Maculopapular rash 09/03/2017  . Nephrolithiasis 07/23/2015   Overview:  x 3  Last Assessment & Plan:  By [place] has left nephrolithiasis, but not visible by KUB. Will check CT renal colic next year to assess both stone burden as well as to surveil AML.   Overview:  x 3  Last Assessment & Plan:  Has 64m nonobstructing LUP stone - not visible by KUB.  Will check renal [place] 07/2018 - he will contact office sooner if symptomatic.   . Non-sustained ventricular tachycardia ([place] 03/29/2017  . Other hyperlipidemia 03/29/2017  . Palpitations 10/01/2017  . Paroxysmal atrial fibrillation ([place] 03/29/2017  . Paroxysmal atrial fibrillation ([place] 03/29/2017  . Pleural effusion, bilateral 08/09/2017  . Post-nasal drainage 02/25/2016   Last Assessment & Plan:  Trial zyrtec and flonase  . Prostate cancer screening 06/20/2017   Last Assessment & Plan:  Recommend continued annual CaP screening until within 10 years of life expectancy. Given good health and fhx of longevity, would anticipate CaP screening to continue until age 72  PSA today and again in one year on day of visit.  . Pulmonary hypertension ([place] 08/09/2017  . Pulmonary nodules 06/12/2016  . S/P AVR (aortic valve replacement) 03/20/2016  . Supratherapeutic INR 07/26/2017  . Syncope 03/29/2017  . Syncope and collapse 02/01/2016  . Typical atrial flutter ([place] 02/01/2016    Past Surgical History:  Procedure Laterality Date  . CHOLECYSTECTOMY    . CORONARY ARTERY BYPASS GRAFT    . EXPLORATORY LAPAROTOMY  07/30/2017  .  FOOT SURGERY    . FRACTURE SURGERY Right    wrist and forearm  . HERNIA REPAIR    . MECHANICAL AORTIC VALVE REPLACEMENT    . NASAL SINUS SURGERY    . RIGHT HEART CATH N/A 01/18/2018   Procedure: RIGHT HEART CATH;  Surgeon: Jolaine Artist, MD;  Location: [facility];  Service: Cardiovascular;  Laterality: N/A;  . VASECTOMY      Current Medications: Current Meds  Medication Sig  . acetaminophen (TYLENOL) 650 MG CR tablet Take 650 mg by mouth every 8 (eight) hours as needed for pain.  Lance Hicks aspirin 81 MG chewable tablet Chew 81 mg by mouth every morning.   . B Complex-C (B-COMPLEX WITH VITAMIN C) tablet Take 1 tablet by mouth daily.   . budesonide (PULMICORT) 0.5 MG/2ML nebulizer solution Take 0.5 mg by nebulization daily as needed (for shortness of breath or wheezing).   . cetirizine (ZYRTEC ALLERGY) 10  MG tablet Take 10 mg by mouth daily as needed for allergies.   . Coenzyme Q10 (CO Q 10 PO) Take 1 capsule by mouth every other day.  . furosemide (LASIX) 20 MG tablet Take 20 mg by mouth every other day.  . Liniments (SALONPAS EX) Apply 1 patch topically daily.  . metoprolol tartrate (LOPRESSOR) 25 MG tablet Take 25 mg by mouth 2 (two) times daily.   . Multiple Vitamins-Minerals (MULTIVITAMIN ADULTS PO) Take 1 tablet by mouth daily.  Lance Hicks spironolactone (ALDACTONE) 25 MG tablet Take 0.5 tablets (12.5 mg total) by mouth daily.  . tadalafil, PAH, (ADCIRCA) 20 MG tablet Take 2 tablets (40 mg total) by mouth daily. (Patient taking differently: Take 60 mg by mouth daily. )  . TUDORZA PRESSAIR 400 MCG/ACT AEPB Inhale 1 puff into the lungs 2 (two) times daily as needed (for shortness of breath or wheezing).   . warfarin (COUMADIN) 5 MG tablet Take 2.5-5 mg by mouth See admin instructions. Take 5 mg by mouth daily on Saturday. Take 2.5 mg by mouth daily on all other days     Allergies:   Amoxicillin-pot clavulanate and Tape   Social History   Socioeconomic History  . Marital status: Single    Spouse name: None  . Number of children: None  . Years of education: None  . Highest education level: None  Social Needs  . Financial resource strain: None  . Food insecurity - worry: None  . Food insecurity - inability: None  . Transportation needs - medical: None  . Transportation needs - non-medical: None  Occupational History  . None  Tobacco Use  . Smoking status: Former Smoker    Packs/day: 2.00    Years: 34.00    Pack years: 68.00    Types: Cigarettes    Last attempt to quit: 07/16/2000    Years since quitting: 17.6  . Smokeless tobacco: Never Used  Substance and Sexual Activity  . Alcohol use: Yes  . Drug use: No  . Sexual activity: None  Other Topics Concern  . None  Social History Narrative  . None     Family History: The patient's family history includes Arthritis in his mother;  Asthma in his mother; Heart attack in his father; Hypertension in his father; Stroke in his paternal grandmother. ROS:   Please see the history of present illness.    All other systems reviewed and are negative.  EKGs/Labs/Other Studies Reviewed:    The following studies were reviewed today:   Recent Labs: 01/09/2018: BNP  668.0 01/18/2018: Hemoglobin 15.9; Platelets 232 02/11/2018: BUN 18; Creatinine, Ser 1.11; Potassium 2.9; Sodium 138  Recent Lipid Panel No results found for: CHOL, TRIG, HDL, CHOLHDL, VLDL, LDLCALC, LDLDIRECT  Physical Exam:    VS:  Ht _0  (1.753 m)   Wt 158 lb (71.7 kg)   BMI 23.33 kg/m     Wt Readings from Last 3 Encounters:  02/20/18 158 lb (71.7 kg)  02/11/18 160 lb 8 oz (72.8 kg)  01/25/18 161 lb (73 kg)     GEN:  Well nourished, well developed in no acute distress HEENT: Normal NECK: No JVD; No carotid bruits he has marked neck vein distention pulsatile the liver is also congested and pulsatile. LYMPHATICS: No lymphadenopathy CARDIAC: Variable first heart sound sharp closing sounds P2 is accentuated expected 1/6 to 2/6 aortic outflow murmur RRR,  RESPIRATORY:  Clear to auscultation without rales, wheezing or rhonchi  ABDOMEN: Soft, non-tender, non-distended MUSCULOSKELETAL: 1-2+ bilaterally to the knee edema he has marked neck vein distention pulsatile the liver is also congested and pulsatile edema; No deformity  SKIN: Warm and dry NEUROLOGIC:  Alert and oriented x 3 PSYCHIATRIC:  Normal affect    Signed, Shirlee More, MD  02/20/2018 3:59 PM    [place]

## 2018-02-19 ENCOUNTER — Telehealth (HOSPITAL_COMMUNITY): Payer: Self-pay | Admitting: *Deleted

## 2018-02-19 ENCOUNTER — Other Ambulatory Visit (HOSPITAL_COMMUNITY): Payer: Self-pay | Admitting: *Deleted

## 2018-02-19 DIAGNOSIS — I272 Pulmonary hypertension, unspecified: Secondary | ICD-10-CM

## 2018-02-19 NOTE — Telephone Encounter (Signed)
Patient called triage line stating he is having leg cramps.  He has potassium 20 mEq at home from when he was discharged from [facility] but wanted to ask Dr. Haroldine Laws first.    I spoke with Dr. Haroldine Laws and he advises patient to take 40 mEq of potassium today only and have Dr. Joya Gaskins office check a bmet and magnesium tomorrow at his office appointment.  I have placed lab orders and called Dr. Joya Gaskins office letting them know to check the labs.  I also spoke with patient and he will take 40 mEq of potassium today only and will wait for [place] to call him about his labs.

## 2018-02-20 ENCOUNTER — Encounter: Payer: Self-pay | Admitting: Cardiology

## 2018-02-20 ENCOUNTER — Ambulatory Visit (INDEPENDENT_AMBULATORY_CARE_PROVIDER_SITE_OTHER): Payer: Medicare Other | Admitting: Cardiology

## 2018-02-20 VITALS — BP 106/68 | HR 57 | Ht 69.0 in | Wt 158.0 lb

## 2018-02-20 DIAGNOSIS — I2721 Secondary pulmonary arterial hypertension: Secondary | ICD-10-CM | POA: Diagnosis not present

## 2018-02-20 DIAGNOSIS — I272 Pulmonary hypertension, unspecified: Secondary | ICD-10-CM

## 2018-02-20 DIAGNOSIS — I110 Hypertensive heart disease with heart failure: Secondary | ICD-10-CM | POA: Diagnosis not present

## 2018-02-20 DIAGNOSIS — Z952 Presence of prosthetic heart valve: Secondary | ICD-10-CM

## 2018-02-20 DIAGNOSIS — J449 Chronic obstructive pulmonary disease, unspecified: Secondary | ICD-10-CM

## 2018-02-20 DIAGNOSIS — Z5181 Encounter for therapeutic drug level monitoring: Secondary | ICD-10-CM | POA: Diagnosis not present

## 2018-02-20 DIAGNOSIS — Z7901 Long term (current) use of anticoagulants: Secondary | ICD-10-CM | POA: Diagnosis not present

## 2018-02-20 HISTORY — DX: Secondary pulmonary arterial hypertension: I27.21

## 2018-02-20 NOTE — Patient Instructions (Signed)
Medication Instructions:  Your physician recommends that you continue on your current medications as directed. Please refer to the Current Medication list given to you today.    Labwork: Your physician recommends that you return for lab work today. BMP, Magnesium, PT-INR.  Testing/Procedures: None  Follow-Up: Your physician wants you to follow-up in: 3 months. You will receive a reminder letter in the mail two months in advance. If you don't receive a letter, please call our office to schedule the follow-up appointment.   Any Other Special Instructions Will Be Listed Below (If Applicable).     If you need a refill on your cardiac medications before your next appointment, please call your pharmacy.

## 2018-02-21 ENCOUNTER — Telehealth: Payer: Self-pay

## 2018-02-21 LAB — BASIC METABOLIC PANEL
BUN/Creatinine Ratio: 18 (ref 10–24)
BUN: 21 mg/dL (ref 8–27)
CO2: 23 mmol/L (ref 20–29)
Calcium: 9.3 mg/dL (ref 8.6–10.2)
Chloride: 104 mmol/L (ref 96–106)
Creatinine, Ser: 1.2 mg/dL (ref 0.76–1.27)
GFR calc Af Amer: 70 mL/min/{1.73_m2} (ref >59)
GFR calc non Af Amer: 61 mL/min/{1.73_m2} (ref >59)
Glucose: 63 mg/dL — ABNORMAL LOW (ref 65–99)
Potassium: 3.9 mmol/L (ref 3.5–5.2)
Sodium: 141 mmol/L (ref 134–144)

## 2018-02-21 LAB — PROTIME-INR
INR: 2.3 — ABNORMAL HIGH (ref 0.8–1.2)
Prothrombin Time: 22.6 s — ABNORMAL HIGH (ref 9.1–12.0)

## 2018-02-21 LAB — MAGNESIUM: Magnesium: 2 mg/dL (ref 1.6–2.3)

## 2018-02-21 NOTE — Progress Notes (Deleted)
Per patient his PCP manages his coumadin. Informed office and patient of the INR results. Routed labs to PCP office as well. Patient stated that he will transition his coumadin care to our office once we are relocated in [place].

## 2018-02-21 NOTE — Telephone Encounter (Signed)
-----  Message from Richardo Priest, MD sent at 02/21/2018  8:22 AM EST ----- Warfarin clinic

## 2018-02-21 NOTE — Progress Notes (Signed)
  This encounter was created in error - please disregard. This encounter was created in error - please disregard.

## 2018-02-21 NOTE — Telephone Encounter (Signed)
Per patient his PCP is managing his coumadin. Informed PCP office of the result as well as routed the result.

## 2018-02-25 ENCOUNTER — Other Ambulatory Visit (HOSPITAL_COMMUNITY): Payer: Self-pay | Admitting: Pharmacist

## 2018-02-25 MED ORDER — MACITENTAN 10 MG PO TABS: 10.0000 mg | ORAL_TABLET | Freq: Every day | ORAL | 11 refills | Status: DC

## 2018-03-15 ENCOUNTER — Other Ambulatory Visit (HOSPITAL_COMMUNITY): Payer: Self-pay | Admitting: *Deleted

## 2018-03-15 MED ORDER — TADALAFIL (PAH) 20 MG PO TABS: 40.0000 mg | ORAL_TABLET | Freq: Every day | ORAL | 11 refills | Status: DC

## 2018-03-21 ENCOUNTER — Telehealth (HOSPITAL_COMMUNITY): Payer: Self-pay | Admitting: Cardiology

## 2018-03-21 DIAGNOSIS — R188 Other ascites: Secondary | ICD-10-CM

## 2018-03-21 DIAGNOSIS — N200 Calculus of kidney: Secondary | ICD-10-CM | POA: Diagnosis not present

## 2018-03-21 DIAGNOSIS — I272 Pulmonary hypertension, unspecified: Secondary | ICD-10-CM

## 2018-03-21 NOTE — Telephone Encounter (Signed)
Would only tap if symptomatic.   I followed up with [facility] to check on referral to Dr. Gilles Chiquito for IV [facility] therapies

## 2018-03-21 NOTE — Telephone Encounter (Signed)
Patient had a follow up u/s done today for kidney stones. Was told by radiologist he does have a moderate amount of fluid in his abdomen.  Reports he does have more abdominal distention than normal, unable to eat as much as he already feels full, weight stable at 153, he now take his lasix mostly daily Mildly more SOB   Advised I will request u/s results and review with provider for further instructions.  ?paracentesis vs increasing diuretics vs OV

## 2018-03-22 NOTE — Telephone Encounter (Signed)
Pt sch for paracentesis Tue 4/2 at 10 am, arrive at 9:30 will need bmet and INR prior per radiology.  Per Dr Haroldine Laws ok, pt does not need to hold coumadin  Pt is aware and agreeable, he will come to our office on Tue 4/2 at 8:30 for labs then will report to radiology.

## 2018-03-22 NOTE — Telephone Encounter (Signed)
Spoke w/pt, he feels he needs to proceed w/paracentesis, he states he can tell he has been feeling worse for past week or so, increased sob, abd distended, decrease appetite and consistently feels full.  Order placed will schedule.

## 2018-03-26 ENCOUNTER — Ambulatory Visit (HOSPITAL_COMMUNITY)
Admission: RE | Admit: 2018-03-26 | Discharge: 2018-03-26 | Disposition: A | Discharge status: Home / Self Care | Payer: Medicare Other | Source: Ambulatory Visit | Attending: Internal Medicine | Admitting: Internal Medicine

## 2018-03-26 ENCOUNTER — Ambulatory Visit (HOSPITAL_COMMUNITY)
Admission: RE | Admit: 2018-03-26 | Discharge: 2018-03-26 | Disposition: A | Discharge status: Home / Self Care | Payer: Medicare Other | Source: Ambulatory Visit | Attending: Cardiology | Admitting: Cardiology

## 2018-03-26 ENCOUNTER — Other Ambulatory Visit (HOSPITAL_COMMUNITY): Payer: Self-pay | Admitting: Internal Medicine

## 2018-03-26 DIAGNOSIS — R188 Other ascites: Secondary | ICD-10-CM | POA: Insufficient documentation

## 2018-03-26 DIAGNOSIS — I272 Pulmonary hypertension, unspecified: Secondary | ICD-10-CM

## 2018-03-26 LAB — BASIC METABOLIC PANEL
Anion gap: 9 (ref 5–15)
BUN: 26 mg/dL — ABNORMAL HIGH (ref 6–20)
CO2: 22 mmol/L (ref 22–32)
Calcium: 9.5 mg/dL (ref 8.9–10.3)
Chloride: 102 mmol/L (ref 101–111)
Creatinine, Ser: 1.25 mg/dL — ABNORMAL HIGH (ref 0.61–1.24)
GFR calc Af Amer: >60 mL/min (ref >60)
GFR calc non Af Amer: 56 mL/min — ABNORMAL LOW (ref >60)
Glucose, Bld: 104 mg/dL — ABNORMAL HIGH (ref 65–99)
Potassium: 3.6 mmol/L (ref 3.5–5.1)
Sodium: 133 mmol/L — ABNORMAL LOW (ref 135–145)

## 2018-03-26 LAB — PROTIME-INR
INR: 2.76
Prothrombin Time: 29 seconds — ABNORMAL HIGH (ref 11.4–15.2)

## 2018-03-26 MED ORDER — LIDOCAINE HCL (PF) 2 % IJ SOLN
INTRAMUSCULAR | Status: AC
  Filled 2018-03-26: qty 20

## 2018-03-26 NOTE — Progress Notes (Signed)
Patient presented to radiology department for possible paracentesis.  Limited Abdomen US shows he has a very small pocket of fluid around the liver edge.  This fluid may be amenable to drainage, however after discussion with patient he feels he would only want to move forward with therapeutic drainage if it would changes his symptoms of shortness of breath, vomiting, and bloating.  The size of the fluid pocket at this time does not suggest any improvement would be made.  No labs or diagnostic testing was requested on sample. Patient declines therapeutic procedure.   Brynda Greathouse, MS RD PA-C 10:48 AM

## 2018-03-27 ENCOUNTER — Telehealth (HOSPITAL_COMMUNITY): Payer: Self-pay | Admitting: *Deleted

## 2018-03-27 ENCOUNTER — Other Ambulatory Visit (HOSPITAL_COMMUNITY): Payer: Self-pay | Admitting: *Deleted

## 2018-03-27 DIAGNOSIS — R1111 Vomiting without nausea: Secondary | ICD-10-CM

## 2018-03-27 NOTE — Telephone Encounter (Signed)
Patient went for Paracentesis but they were unable to pull off any fluid.  Patient continues to have episodes of vomiting/bloating and they suggested patient be referred to see GI.  Referral placed and patient is aware they will contact him for initial appointment.

## 2018-04-02 ENCOUNTER — Telehealth: Payer: Self-pay | Admitting: Cardiology

## 2018-04-02 NOTE — Telephone Encounter (Signed)
Patient would like a recommendation for a Gastroenterologist please.

## 2018-04-02 NOTE — Telephone Encounter (Signed)
Dr Lyndel Safe

## 2018-04-02 NOTE — Telephone Encounter (Signed)
Patient advised Dr. Lyndel Safe. Patient verbalized understanding and will contact him to schedule appointment.

## 2018-04-02 NOTE — Telephone Encounter (Signed)
Please advise.

## 2018-04-15 DIAGNOSIS — N200 Calculus of kidney: Secondary | ICD-10-CM | POA: Diagnosis not present

## 2018-04-15 DIAGNOSIS — D3001 Benign neoplasm of right kidney: Secondary | ICD-10-CM | POA: Diagnosis not present

## 2018-04-19 ENCOUNTER — Telehealth: Payer: Self-pay | Admitting: Gastroenterology

## 2018-04-29 DIAGNOSIS — M310 Hypersensitivity angiitis: Secondary | ICD-10-CM | POA: Diagnosis not present

## 2018-04-30 NOTE — Telephone Encounter (Signed)
He is wondering if you have had an opportunity to review his old records and if you will take him on as a new patient?  Please advise.  Thank you.

## 2018-04-30 NOTE — Telephone Encounter (Signed)
Patient calling back to check status of records being reviewed.

## 2018-05-01 DIAGNOSIS — I2699 Other pulmonary embolism without acute cor pulmonale: Secondary | ICD-10-CM | POA: Diagnosis not present

## 2018-05-01 NOTE — Telephone Encounter (Signed)
I have reviewed the notes and given it to [place].  it would be an honour to take care of him

## 2018-05-02 ENCOUNTER — Telehealth: Payer: Self-pay

## 2018-05-02 NOTE — Telephone Encounter (Signed)
Spoke with patient and scheduled an appointment with Dr Lyndel Safe on 05/30/18 at 11:30.  He was asked to arrive 15 minutes early.

## 2018-05-03 ENCOUNTER — Telehealth (HOSPITAL_COMMUNITY): Payer: Self-pay | Admitting: *Deleted

## 2018-05-03 DIAGNOSIS — I272 Pulmonary hypertension, unspecified: Secondary | ICD-10-CM

## 2018-05-03 DIAGNOSIS — Z7901 Long term (current) use of anticoagulants: Secondary | ICD-10-CM

## 2018-05-03 NOTE — Telephone Encounter (Signed)
Received fax from [facility], they need order for home oxygen and portalbe tank and an ONO as well.  Orders have been previously placed per Dr Haroldine Laws, printed orders along with last OV note and faxed to them at [phone]

## 2018-05-06 ENCOUNTER — Telehealth: Payer: Self-pay | Admitting: Cardiology

## 2018-05-06 NOTE — Telephone Encounter (Signed)
Please call regarding lasix. Patient states doctor told him one thing and prescription says something else. Please clarify.

## 2018-05-06 NOTE — Telephone Encounter (Signed)
Patient has been taking furosemide 40 mg every day. Patient states the 40 mg has been taking off enough fluid that he feels comfortable. We have in the chart 20 mg every other day. Please advise if okay to refill prescription for furosemide 40 mg daily.

## 2018-05-06 NOTE — Telephone Encounter (Signed)
Yes refill 40 mg day

## 2018-05-07 MED ORDER — FUROSEMIDE 40 MG PO TABS: 40.0000 mg | ORAL_TABLET | Freq: Every day | ORAL | 3 refills | Status: DC

## 2018-05-07 NOTE — Addendum Note (Signed)
Addended by: Stevan Born on: 05/07/2018 11:56 AM   Modules accepted: Orders

## 2018-05-07 NOTE — Telephone Encounter (Signed)
Left message on patients voicemail that furosemide 36m daily has been sent to pharmacy as requested.

## 2018-05-13 ENCOUNTER — Telehealth: Payer: Self-pay

## 2018-05-13 ENCOUNTER — Telehealth (HOSPITAL_COMMUNITY): Payer: Self-pay | Admitting: *Deleted

## 2018-05-13 NOTE — Telephone Encounter (Signed)
I suspect secondary to treatment of PAH, would refer to Dr Wonda Cerise I thought he had been referred to [facility]?

## 2018-05-13 NOTE — Telephone Encounter (Signed)
Per Dr.Bensimhon patient needs to be admitted. Called patient he is agreeable with plan. Called bed placement and scheduled patient for a direct admit to [facility] tomorrow morning.

## 2018-05-13 NOTE — Telephone Encounter (Signed)
Patient advised to contact Dr. Clayborne Dana office for advice regarding blood pressure as they have been managing medications due to pulmonary artery hypertension. Also advised patient to inquire about [facility] referral. Patient verbalized understanding. No further questions.

## 2018-05-13 NOTE — Telephone Encounter (Signed)
Called patient to remind him to bring his pulmonary hypertension medications with him tomorrow for direct admit. Patient aware and agreeable.

## 2018-05-13 NOTE — Telephone Encounter (Signed)
Pt called stating that the past 3-4 days his blood pressure has been dropping. States he would be at 90 and drop to 64 and 99 and drop to 88 systolically. Patient has been dizzy and lightheaded. Patient states that currently his left leg is swollen and he can't bend his knee. He said he has zero energy/ Pt states he is taking all medications as prescribed. Pts weight is 161lbs today he is up 5lbs in 3 days. Also wanted to know if referral to [facility] had been [facility]. I told him I would follow up with Dr.Bensimhon and call him back with any recommendations.  Message routed to [place]

## 2018-05-13 NOTE — Telephone Encounter (Signed)
Patient called stating that the past 3-4 days his blood pressure has been dropping. States he would be at 90 and drop to 64 and 99 and drop to 88 systolically. Patient has been dizzy and lightheaded. Patient states that he can't get our of the chair sometimes. Patient states that currently his left leg is swollen and he can't bend his knee. His right leg is not swollen.   Please advise.

## 2018-05-14 ENCOUNTER — Inpatient Hospital Stay (HOSPITAL_COMMUNITY): Payer: Medicare Other

## 2018-05-14 ENCOUNTER — Inpatient Hospital Stay (HOSPITAL_COMMUNITY)
Admission: AD | Admit: 2018-05-14 | Discharge: 2018-05-23 | DRG: 315 | Disposition: A | Discharge status: Home / Self Care | Payer: Medicare Other | Source: Ambulatory Visit | Attending: Internal Medicine | Admitting: Internal Medicine

## 2018-05-14 ENCOUNTER — Encounter (HOSPITAL_COMMUNITY)
Admission: AD | Disposition: A | Discharge status: Home / Self Care | Payer: Self-pay | Source: Ambulatory Visit | Attending: Internal Medicine

## 2018-05-14 DIAGNOSIS — M310 Hypersensitivity angiitis: Secondary | ICD-10-CM | POA: Diagnosis present

## 2018-05-14 DIAGNOSIS — I130 Hypertensive heart and chronic kidney disease with heart failure and stage 1 through stage 4 chronic kidney disease, or unspecified chronic kidney disease: Secondary | ICD-10-CM | POA: Diagnosis not present

## 2018-05-14 DIAGNOSIS — Z881 Allergy status to other antibiotic agents status: Secondary | ICD-10-CM | POA: Diagnosis not present

## 2018-05-14 DIAGNOSIS — I2729 Other secondary pulmonary hypertension: Secondary | ICD-10-CM | POA: Diagnosis present

## 2018-05-14 DIAGNOSIS — Z91048 Other nonmedicinal substance allergy status: Secondary | ICD-10-CM | POA: Diagnosis not present

## 2018-05-14 DIAGNOSIS — I509 Heart failure, unspecified: Secondary | ICD-10-CM | POA: Diagnosis not present

## 2018-05-14 DIAGNOSIS — B952 Enterococcus as the cause of diseases classified elsewhere: Secondary | ICD-10-CM | POA: Diagnosis not present

## 2018-05-14 DIAGNOSIS — S43421A Sprain of right rotator cuff capsule, initial encounter: Secondary | ICD-10-CM | POA: Diagnosis present

## 2018-05-14 DIAGNOSIS — I50810 Right heart failure, unspecified: Secondary | ICD-10-CM | POA: Diagnosis present

## 2018-05-14 DIAGNOSIS — R188 Other ascites: Secondary | ICD-10-CM | POA: Diagnosis not present

## 2018-05-14 DIAGNOSIS — E7849 Other hyperlipidemia: Secondary | ICD-10-CM | POA: Diagnosis present

## 2018-05-14 DIAGNOSIS — J449 Chronic obstructive pulmonary disease, unspecified: Secondary | ICD-10-CM | POA: Diagnosis present

## 2018-05-14 DIAGNOSIS — Z952 Presence of prosthetic heart valve: Secondary | ICD-10-CM | POA: Diagnosis not present

## 2018-05-14 DIAGNOSIS — K7460 Unspecified cirrhosis of liver: Secondary | ICD-10-CM

## 2018-05-14 DIAGNOSIS — Z7901 Long term (current) use of anticoagulants: Secondary | ICD-10-CM

## 2018-05-14 DIAGNOSIS — M25511 Pain in right shoulder: Secondary | ICD-10-CM | POA: Diagnosis not present

## 2018-05-14 DIAGNOSIS — R011 Cardiac murmur, unspecified: Secondary | ICD-10-CM | POA: Diagnosis not present

## 2018-05-14 DIAGNOSIS — R7881 Bacteremia: Secondary | ICD-10-CM | POA: Diagnosis not present

## 2018-05-14 DIAGNOSIS — Z7982 Long term (current) use of aspirin: Secondary | ICD-10-CM

## 2018-05-14 DIAGNOSIS — Z79899 Other long term (current) drug therapy: Secondary | ICD-10-CM

## 2018-05-14 DIAGNOSIS — Y93K9 Activity, other involving animal care: Secondary | ICD-10-CM | POA: Diagnosis not present

## 2018-05-14 DIAGNOSIS — Z7951 Long term (current) use of inhaled steroids: Secondary | ICD-10-CM

## 2018-05-14 DIAGNOSIS — I2510 Atherosclerotic heart disease of native coronary artery without angina pectoris: Secondary | ICD-10-CM | POA: Diagnosis not present

## 2018-05-14 DIAGNOSIS — S46819A Strain of other muscles, fascia and tendons at shoulder and upper arm level, unspecified arm, initial encounter: Secondary | ICD-10-CM

## 2018-05-14 DIAGNOSIS — M19011 Primary osteoarthritis, right shoulder: Secondary | ICD-10-CM | POA: Diagnosis not present

## 2018-05-14 DIAGNOSIS — I2609 Other pulmonary embolism with acute cor pulmonale: Secondary | ICD-10-CM

## 2018-05-14 DIAGNOSIS — T80211A Bloodstream infection due to central venous catheter, initial encounter: Secondary | ICD-10-CM | POA: Diagnosis not present

## 2018-05-14 DIAGNOSIS — J9610 Chronic respiratory failure, unspecified whether with hypoxia or hypercapnia: Secondary | ICD-10-CM | POA: Diagnosis present

## 2018-05-14 DIAGNOSIS — J432 Centrilobular emphysema: Secondary | ICD-10-CM | POA: Diagnosis not present

## 2018-05-14 DIAGNOSIS — I350 Nonrheumatic aortic (valve) stenosis: Secondary | ICD-10-CM | POA: Diagnosis not present

## 2018-05-14 DIAGNOSIS — I081 Rheumatic disorders of both mitral and tricuspid valves: Secondary | ICD-10-CM | POA: Diagnosis present

## 2018-05-14 DIAGNOSIS — I482 Chronic atrial fibrillation: Secondary | ICD-10-CM | POA: Diagnosis present

## 2018-05-14 DIAGNOSIS — M75101 Unspecified rotator cuff tear or rupture of right shoulder, not specified as traumatic: Secondary | ICD-10-CM

## 2018-05-14 DIAGNOSIS — J849 Interstitial pulmonary disease, unspecified: Secondary | ICD-10-CM | POA: Diagnosis present

## 2018-05-14 DIAGNOSIS — Z951 Presence of aortocoronary bypass graft: Secondary | ICD-10-CM

## 2018-05-14 DIAGNOSIS — X509XXA Other and unspecified overexertion or strenuous movements or postures, initial encounter: Secondary | ICD-10-CM

## 2018-05-14 DIAGNOSIS — Z880 Allergy status to penicillin: Secondary | ICD-10-CM

## 2018-05-14 DIAGNOSIS — I4891 Unspecified atrial fibrillation: Secondary | ICD-10-CM | POA: Diagnosis not present

## 2018-05-14 DIAGNOSIS — I071 Rheumatic tricuspid insufficiency: Secondary | ICD-10-CM | POA: Diagnosis not present

## 2018-05-14 DIAGNOSIS — I2781 Cor pulmonale (chronic): Secondary | ICD-10-CM | POA: Diagnosis present

## 2018-05-14 DIAGNOSIS — J811 Chronic pulmonary edema: Secondary | ICD-10-CM | POA: Diagnosis not present

## 2018-05-14 DIAGNOSIS — N183 Chronic kidney disease, stage 3 (moderate): Secondary | ICD-10-CM | POA: Diagnosis present

## 2018-05-14 DIAGNOSIS — M75111 Incomplete rotator cuff tear or rupture of right shoulder, not specified as traumatic: Secondary | ICD-10-CM | POA: Diagnosis not present

## 2018-05-14 DIAGNOSIS — I361 Nonrheumatic tricuspid (valve) insufficiency: Secondary | ICD-10-CM | POA: Diagnosis not present

## 2018-05-14 DIAGNOSIS — Z87891 Personal history of nicotine dependence: Secondary | ICD-10-CM | POA: Diagnosis not present

## 2018-05-14 DIAGNOSIS — T80219A Unspecified infection due to central venous catheter, initial encounter: Secondary | ICD-10-CM | POA: Diagnosis not present

## 2018-05-14 DIAGNOSIS — R600 Localized edema: Secondary | ICD-10-CM | POA: Diagnosis not present

## 2018-05-14 DIAGNOSIS — I272 Pulmonary hypertension, unspecified: Secondary | ICD-10-CM | POA: Diagnosis not present

## 2018-05-14 DIAGNOSIS — M25519 Pain in unspecified shoulder: Secondary | ICD-10-CM

## 2018-05-14 DIAGNOSIS — I4892 Unspecified atrial flutter: Secondary | ICD-10-CM | POA: Diagnosis present

## 2018-05-14 DIAGNOSIS — I517 Cardiomegaly: Secondary | ICD-10-CM | POA: Diagnosis not present

## 2018-05-14 DIAGNOSIS — R58 Hemorrhage, not elsewhere classified: Secondary | ICD-10-CM | POA: Diagnosis not present

## 2018-05-14 HISTORY — PX: RIGHT HEART CATH: CATH118263

## 2018-05-14 LAB — PROTIME-INR
INR: 1.94
Prothrombin Time: 22 seconds — ABNORMAL HIGH (ref 11.4–15.2)

## 2018-05-14 LAB — COMPREHENSIVE METABOLIC PANEL
ALT: 14 U/L — ABNORMAL LOW (ref 17–63)
AST: 37 U/L (ref 15–41)
Albumin: 3 g/dL — ABNORMAL LOW (ref 3.5–5.0)
Alkaline Phosphatase: 78 U/L (ref 38–126)
Anion gap: 10 (ref 5–15)
BUN: 26 mg/dL — ABNORMAL HIGH (ref 6–20)
CO2: 23 mmol/L (ref 22–32)
Calcium: 8.9 mg/dL (ref 8.9–10.3)
Chloride: 100 mmol/L — ABNORMAL LOW (ref 101–111)
Creatinine, Ser: 1.39 mg/dL — ABNORMAL HIGH (ref 0.61–1.24)
GFR calc Af Amer: 57 mL/min — ABNORMAL LOW (ref >60)
GFR calc non Af Amer: 49 mL/min — ABNORMAL LOW (ref >60)
Glucose, Bld: 85 mg/dL (ref 65–99)
Potassium: 3.2 mmol/L — ABNORMAL LOW (ref 3.5–5.1)
Sodium: 133 mmol/L — ABNORMAL LOW (ref 135–145)
Total Bilirubin: 4.8 mg/dL — ABNORMAL HIGH (ref 0.3–1.2)
Total Protein: 7.7 g/dL (ref 6.5–8.1)

## 2018-05-14 LAB — POCT I-STAT 3, VENOUS BLOOD GAS (G3P V)
Acid-base deficit: 2 mmol/L (ref 0.0–2.0)
Acid-base deficit: 3 mmol/L — ABNORMAL HIGH (ref 0.0–2.0)
Acid-base deficit: 3 mmol/L — ABNORMAL HIGH (ref 0.0–2.0)
Acid-base deficit: 3 mmol/L — ABNORMAL HIGH (ref 0.0–2.0)
Bicarbonate: 20.1 mmol/L (ref 20.0–28.0)
Bicarbonate: 20.1 mmol/L (ref 20.0–28.0)
Bicarbonate: 20.5 mmol/L (ref 20.0–28.0)
Bicarbonate: 20.6 mmol/L (ref 20.0–28.0)
O2 Saturation: 72 %
O2 Saturation: 73 %
O2 Saturation: 73 %
O2 Saturation: 73 %
TCO2: 21 mmol/L — ABNORMAL LOW (ref 22–32)
TCO2: 21 mmol/L — ABNORMAL LOW (ref 22–32)
TCO2: 21 mmol/L — ABNORMAL LOW (ref 22–32)
TCO2: 22 mmol/L (ref 22–32)
pCO2, Ven: 29.2 mmHg — ABNORMAL LOW (ref 44.0–60.0)
pCO2, Ven: 29.5 mmHg — ABNORMAL LOW (ref 44.0–60.0)
pCO2, Ven: 29.7 mmHg — ABNORMAL LOW (ref 44.0–60.0)
pCO2, Ven: 30.4 mmHg — ABNORMAL LOW (ref 44.0–60.0)
pH, Ven: 7.429 (ref 7.250–7.430)
pH, Ven: 7.445 — ABNORMAL HIGH (ref 7.250–7.430)
pH, Ven: 7.45 — ABNORMAL HIGH (ref 7.250–7.430)
pH, Ven: 7.45 — ABNORMAL HIGH (ref 7.250–7.430)
pO2, Ven: 35 mmHg (ref 32.0–45.0)
pO2, Ven: 36 mmHg (ref 32.0–45.0)
pO2, Ven: 36 mmHg (ref 32.0–45.0)
pO2, Ven: 37 mmHg (ref 32.0–45.0)

## 2018-05-14 LAB — TSH: TSH: 3.227 u[IU]/mL (ref 0.350–4.500)

## 2018-05-14 LAB — CBC WITH DIFFERENTIAL/PLATELET
Abs Immature Granulocytes: 0 10*3/uL (ref 0.0–0.1)
Basophils Absolute: 0 10*3/uL (ref 0.0–0.1)
Basophils Relative: 1 %
Eosinophils Absolute: 0 10*3/uL (ref 0.0–0.7)
Eosinophils Relative: 1 %
HCT: 34.4 % — ABNORMAL LOW (ref 39.0–52.0)
Hemoglobin: 11.1 g/dL — ABNORMAL LOW (ref 13.0–17.0)
Immature Granulocytes: 0 %
Lymphocytes Relative: 9 %
Lymphs Abs: 0.5 10*3/uL — ABNORMAL LOW (ref 0.7–4.0)
MCH: 27.5 pg (ref 26.0–34.0)
MCHC: 32.3 g/dL (ref 30.0–36.0)
MCV: 85.4 fL (ref 78.0–100.0)
Monocytes Absolute: 0.7 10*3/uL (ref 0.1–1.0)
Monocytes Relative: 11 %
Neutro Abs: 5 10*3/uL (ref 1.7–7.7)
Neutrophils Relative %: 80 %
Platelets: 213 10*3/uL (ref 150–400)
RBC: 4.03 MIL/uL — ABNORMAL LOW (ref 4.22–5.81)
RDW: 17.2 % — ABNORMAL HIGH (ref 11.5–15.5)
WBC: 6.3 10*3/uL (ref 4.0–10.5)

## 2018-05-14 LAB — BRAIN NATRIURETIC PEPTIDE: B Natriuretic Peptide: 1247 pg/mL — ABNORMAL HIGH (ref 0.0–100.0)

## 2018-05-14 LAB — MAGNESIUM: Magnesium: 1.9 mg/dL (ref 1.7–2.4)

## 2018-05-14 SURGERY — RIGHT HEART CATH
Anesthesia: LOCAL

## 2018-05-14 MED ORDER — ACETAMINOPHEN ER 650 MG PO TBCR: 650.0000 mg | EXTENDED_RELEASE_TABLET | Freq: Three times a day (TID) | ORAL | Status: DC | PRN

## 2018-05-14 MED ORDER — SODIUM CHLORIDE 0.9% FLUSH
3.0000 mL | Freq: Two times a day (BID) | INTRAVENOUS | Status: DC
  Administered 2018-05-15 – 2018-05-18 (×5): 3 mL via INTRAVENOUS

## 2018-05-14 MED ORDER — SPIRONOLACTONE 12.5 MG HALF TABLET
12.5000 mg | ORAL_TABLET | Freq: Every day | ORAL | Status: DC
  Administered 2018-05-15 – 2018-05-23 (×8): 12.5 mg via ORAL
  Filled 2018-05-14 (×9): qty 1

## 2018-05-14 MED ORDER — POTASSIUM CHLORIDE CRYS ER 20 MEQ PO TBCR
40.0000 meq | EXTENDED_RELEASE_TABLET | Freq: Once | ORAL | Status: AC
  Administered 2018-05-14: 40 meq via ORAL
  Filled 2018-05-14: qty 2

## 2018-05-14 MED ORDER — SODIUM CHLORIDE 0.9% FLUSH: 3.0000 mL | Freq: Two times a day (BID) | INTRAVENOUS | Status: DC

## 2018-05-14 MED ORDER — SODIUM CHLORIDE 0.9% FLUSH: 3.0000 mL | INTRAVENOUS | Status: DC | PRN

## 2018-05-14 MED ORDER — FUROSEMIDE 10 MG/ML IJ SOLN
80.0000 mg | Freq: Two times a day (BID) | INTRAMUSCULAR | Status: DC
  Administered 2018-05-14 – 2018-05-20 (×12): 80 mg via INTRAVENOUS
  Filled 2018-05-14 (×14): qty 8

## 2018-05-14 MED ORDER — WARFARIN - PHARMACIST DOSING INPATIENT
Freq: Every day | Status: DC
  Administered 2018-05-14 – 2018-05-21 (×4)

## 2018-05-14 MED ORDER — ACETAMINOPHEN 325 MG PO TABS
650.0000 mg | ORAL_TABLET | ORAL | Status: DC | PRN
  Administered 2018-05-17: 650 mg via ORAL
  Filled 2018-05-14: qty 2

## 2018-05-14 MED ORDER — TIOTROPIUM BROMIDE MONOHYDRATE 18 MCG IN CAPS
18.0000 ug | ORAL_CAPSULE | Freq: Two times a day (BID) | RESPIRATORY_TRACT | Status: DC | PRN
  Filled 2018-05-14: qty 5

## 2018-05-14 MED ORDER — SODIUM CHLORIDE 0.9 % IV SOLN: 250.0000 mL | INTRAVENOUS | Status: DC | PRN

## 2018-05-14 MED ORDER — ONDANSETRON HCL 4 MG/2ML IJ SOLN: 4.0000 mg | Freq: Four times a day (QID) | INTRAMUSCULAR | Status: DC | PRN

## 2018-05-14 MED ORDER — LIDOCAINE HCL (PF) 1 % IJ SOLN
INTRAMUSCULAR | Status: AC
  Filled 2018-05-14: qty 30

## 2018-05-14 MED ORDER — TRAZODONE HCL 50 MG PO TABS
50.0000 mg | ORAL_TABLET | Freq: Every evening | ORAL | Status: DC | PRN
  Administered 2018-05-14 – 2018-05-22 (×9): 50 mg via ORAL
  Filled 2018-05-14 (×9): qty 1

## 2018-05-14 MED ORDER — SODIUM CHLORIDE 0.9 % IV SOLN: INTRAVENOUS | Status: DC

## 2018-05-14 MED ORDER — LIDOCAINE HCL (PF) 1 % IJ SOLN
INTRAMUSCULAR | Status: DC | PRN
  Administered 2018-05-14: 2 mL via SUBCUTANEOUS

## 2018-05-14 MED ORDER — BUDESONIDE 0.5 MG/2ML IN SUSP: 0.5000 mg | Freq: Every day | RESPIRATORY_TRACT | Status: DC | PRN

## 2018-05-14 MED ORDER — METOPROLOL TARTRATE 25 MG PO TABS
25.0000 mg | ORAL_TABLET | Freq: Two times a day (BID) | ORAL | Status: DC
  Administered 2018-05-14 – 2018-05-15 (×2): 25 mg via ORAL
  Filled 2018-05-14 (×2): qty 1

## 2018-05-14 MED ORDER — TADALAFIL (PAH) 20 MG PO TABS
40.0000 mg | ORAL_TABLET | Freq: Every day | ORAL | Status: DC
  Administered 2018-05-15 – 2018-05-16 (×2): 40 mg via ORAL
  Filled 2018-05-14 (×3): qty 2

## 2018-05-14 MED ORDER — MACITENTAN 10 MG PO TABS
10.0000 mg | ORAL_TABLET | Freq: Every day | ORAL | Status: DC
  Administered 2018-05-15 – 2018-05-23 (×8): 10 mg via ORAL
  Filled 2018-05-14 (×11): qty 1

## 2018-05-14 MED ORDER — SODIUM CHLORIDE 0.9 % IV SOLN
250.0000 mL | INTRAVENOUS | Status: DC | PRN
  Administered 2018-05-15: 250 mL via INTRAVENOUS

## 2018-05-14 MED ORDER — ACETAMINOPHEN 325 MG PO TABS: 650.0000 mg | ORAL_TABLET | ORAL | Status: DC | PRN

## 2018-05-14 MED ORDER — ASPIRIN 81 MG PO CHEW
81.0000 mg | CHEWABLE_TABLET | ORAL | Status: DC
  Administered 2018-05-15 – 2018-05-23 (×9): 81 mg via ORAL
  Filled 2018-05-14 (×9): qty 1

## 2018-05-14 MED ORDER — LORATADINE 10 MG PO TABS
10.0000 mg | ORAL_TABLET | Freq: Every day | ORAL | Status: DC
  Administered 2018-05-16 – 2018-05-22 (×5): 10 mg via ORAL
  Filled 2018-05-14 (×10): qty 1

## 2018-05-14 MED ORDER — HEPARIN (PORCINE) IN NACL 1000-0.9 UT/500ML-% IV SOLN
INTRAVENOUS | Status: AC
  Filled 2018-05-14: qty 500

## 2018-05-14 MED ORDER — HEPARIN (PORCINE) IN NACL 2-0.9 UNITS/ML
INTRAMUSCULAR | Status: AC | PRN
  Administered 2018-05-14: 500 mL

## 2018-05-14 MED ORDER — B COMPLEX-C PO TABS: 1.0000 | ORAL_TABLET | Freq: Every day | ORAL | Status: DC

## 2018-05-14 MED ORDER — HEPARIN (PORCINE) IN NACL 100-0.45 UNIT/ML-% IJ SOLN
1600.0000 [IU]/h | INTRAMUSCULAR | Status: DC
  Administered 2018-05-14: 1000 [IU]/h via INTRAVENOUS
  Administered 2018-05-15: 1500 [IU]/h via INTRAVENOUS
  Administered 2018-05-16 – 2018-05-17 (×2): 1600 [IU]/h via INTRAVENOUS
  Filled 2018-05-14 (×4): qty 250

## 2018-05-14 MED ORDER — ACETAMINOPHEN 325 MG PO TABS: 650.0000 mg | ORAL_TABLET | Freq: Three times a day (TID) | ORAL | Status: DC | PRN

## 2018-05-14 MED ORDER — SODIUM CHLORIDE 0.9% FLUSH
3.0000 mL | INTRAVENOUS | Status: DC | PRN
  Administered 2018-05-19: 3 mL via INTRAVENOUS
  Filled 2018-05-14: qty 3

## 2018-05-14 MED ORDER — WARFARIN SODIUM 5 MG PO TABS
5.0000 mg | ORAL_TABLET | Freq: Once | ORAL | Status: AC
  Administered 2018-05-14: 5 mg via ORAL
  Filled 2018-05-14: qty 1

## 2018-05-14 MED ORDER — SODIUM CHLORIDE 0.9% FLUSH
3.0000 mL | Freq: Two times a day (BID) | INTRAVENOUS | Status: DC
  Administered 2018-05-15 – 2018-05-22 (×12): 3 mL via INTRAVENOUS

## 2018-05-14 SURGICAL SUPPLY — 9 items
CATH SWAN GANZ VIP 7.5F (CATHETERS) ×2 IMPLANT
COVER PRB 48X5XTLSCP FOLD TPE (BAG) ×1 IMPLANT
COVER PROBE 5X48 (BAG) ×1
PACK CARDIAC CATHETERIZATION (CUSTOM PROCEDURE TRAY) ×2 IMPLANT
PROTECTION STATION PRESSURIZED (MISCELLANEOUS) ×2
SHEATH PINNACLE 8F 10CM (SHEATH) ×2 IMPLANT
SLEEVE REPOSITIONING LENGTH 30 (MISCELLANEOUS) ×2 IMPLANT
STATION PROTECTION PRESSURIZED (MISCELLANEOUS) ×1 IMPLANT
TRANSDUCER W/STOPCOCK (MISCELLANEOUS) ×2 IMPLANT

## 2018-05-14 NOTE — H&P (Addendum)
Advanced Heart Failure Team History and Physical Note   PCP:  Townsend Roger, MD  PCP-Cardiology: No primary care provider on file.     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 Also has COPD (quit smoking in 2002 - 1 ppd x 25 years), HL, HTN  Last seen in HF clinic 02/11/18. Had started macitentan at that time, but had not been able to afford adcirca. He was referred to [facility] for possible IV prostanoid therapy but has not heard back yet. Adcirca switched to sildenafil. Currently on macitentan 10 daily and sildenafil 20 tid.   Pt called clinic 05/13/18 with 3-4 days of BP dropping as low as 39-76B systolic. He has been dizzy and lightheaded and having peripheral edema. He has had "zero" energy and weight has been trending up. Asked to come to hospital for direct admit.   Has been feeling terrible. Has been having significant bendopnea. He states he has had 2 episodes of syncope while trying to take care of his horses. SOB with minimal exertion. Progressive edema.  UOP has dropped off. Frequent abdominal pain and nausea.   [place] 01/18/18 RA = 23 RV = 84/21 PA = 81/38 (53) PCW = 22 Fick cardiac output/index = 3.0/1.6 PVR = 10.4 WU FA sat = 98% on 2L  (checked on RA = 93%) PA sat = 60%, 61% SVC sat = 62%  PFTs 1/19 FEV1 2.49L (80%) FVC  3.20L (76%) DLCO 25%  VQ 11/18 Negative for PE    Review of Systems: [y] = yes, _0  = no   General: Weight gain _1 ; Weight loss _2 ; Anorexia [ y]; Fatigue [y]; Fever _3 ; Chills _4 ; Weakness [y]  Cardiac: Chest pain/pressure _5 ; Resting SOB [y]; Exertional SOB [y]; Orthopnea [y]; Pedal Edema [y]; Palpitations _6 ; Syncope Blue.Reese ]; Presyncope Blue.Reese ]; Paroxysmal nocturnal dyspnea_7   Pulmonary: Cough _8 ; Wheezing_9 ; Hemoptysis_10 ;  Sputum _11 ; Snoring _12   GI: Vomiting_13 ; Dysphagia_14 ; Melena_15 ; Hematochezia _16 ; Heartburn_17 ; Abdominal pain Blue.Reese ]; Constipation _18 ; Diarrhea _19 ; BRBPR _20   GU: Hematuria_21 ; Dysuria _22 ; Nocturia_23   Vascular: Pain in legs with walking _24 ; Pain in feet with lying flat _25 ; Non-healing sores _26 ; Stroke _27 ; TIA _28 ; Slurred speech _29 ;  Neuro: Headaches_30 ; Vertigo_31 ; Seizures_32 ; Paresthesias_33 ;Blurred vision _34 ; Diplopia _35 ; Vision changes _36   Ortho/Skin: Arthritis [y]; Joint pain [y]; Muscle pain _37 ; Joint swelling _38 ; Back Pain _39 ; Rash _40   Psych: Depression_41 ; Anxiety_42   Heme: Bleeding problems _43 ; Clotting disorders _44 ; Anemia _45   Endocrine: Diabetes _46 ; Thyroid dysfunction_47    Home Medications Prior to Admission medications   Medication Sig Start Date End Date Taking? Authorizing Provider  acetaminophen (TYLENOL) 650 MG CR tablet Take 650 mg by mouth every 8 (eight) hours as needed for pain.    [provider]  aspirin 81 MG chewable tablet Chew 81 mg by mouth every morning.     [provider]  B Complex-C (B-COMPLEX WITH VITAMIN C) tablet Take 1 tablet by mouth daily.     [provider]  budesonide (PULMICORT) 0.5 MG/2ML nebulizer solution Take 0.5 mg by nebulization daily as needed (for shortness of breath or wheezing).  12/24/17   [provider]  cetirizine (ZYRTEC ALLERGY) 10 MG tablet Take 10 mg by mouth daily as needed for allergies.     [provider]  Coenzyme Q10 (CO Q 10 PO) Take 1 capsule by mouth every other day.    [provider]  furosemide (LASIX) 40 MG tablet Take 1 tablet (40 mg total) by mouth daily. 05/07/18   Richardo Priest, MD  [facility] EX) Apply 1 patch topically daily.    [provider]  Macitentan (OPSUMIT) 10 MG TABS Take 1 tablet (10 mg total) by mouth daily. 02/25/18   Dekota Shenk, Shaune Pascal, MD  metoprolol tartrate (LOPRESSOR) 25 MG tablet Take 25 mg by mouth 2  (two) times daily.  12/24/17   [provider]  Multiple Vitamins-Minerals (MULTIVITAMIN ADULTS PO) Take 1 tablet by mouth daily.    [provider]  spironolactone (ALDACTONE) 25 MG tablet Take 0.5 tablets (12.5 mg total) by mouth daily. 01/11/18   Emanuela Runnion, Shaune Pascal, MD  tadalafil, PAH, (ADCIRCA) 20 MG tablet Take 2 tablets (40 mg total) by mouth daily. 03/15/18   Cale Bethard, Shaune Pascal, MD  TUDORZA PRESSAIR 400 MCG/ACT AEPB Inhale 1 puff into the lungs 2 (two) times daily as needed (for shortness of breath or wheezing).  11/23/17   [provider]  warfarin (COUMADIN) 5 MG tablet Take 2.5-5 mg by mouth See admin instructions. Take 5 mg by mouth daily on Saturday. Take 2.5 mg by mouth daily on all other days 08/18/16 08/12/18  [provider]    Past Medical History: Past Medical History:  Diagnosis Date  . 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 In light of concurrent left nephrolithiasis, will check CT renal colic next year instead of renal US.   [name] Anticoagulated on Coumadin 01/04/2018  . Anxiety 05/10/2016   Last Assessment & Plan:  Doing well off of zoloft.  . Asymptomatic microscopic hematuria 06/20/2017   Last Assessment & Plan:  Had hematuria workup in [facility] in 2016 which negative CT and cystoscopy. UA with 2+ blood last visit - we discussed recommendation for repeat workup at 5 years or if degree of hematuria progresses.   . Atrial fibrillation ([place]) 03/29/2017  . Atrial flutter ([place]) 03/29/2017  . Chronic allergic rhinitis 04/28/2016   Last Assessment & Plan:  Continue astelin  . Chronic anticoagulation 03/29/2017  . Chronic atrial fibrillation ([place]) 07/23/2015   Last Assessment & Plan:  Coumadin and metoprolol, cardiology referral to establish care.  . Chronic midline back pain 12/14/2015   Last Assessment & Plan:  Pain management referral for further evaluation.  . Chronic prostatitis 07/23/2015    Last Assessment & Plan:  Has largely resolved since stopping bike riding. Recommend annual DRE AND PSA - will see back 12/2015 for annual screening, given 1st degree fhx. To call office for recurrent prostatitis symptoms.   [name] COPD (chronic obstructive pulmonary disease) ([place]) 06/12/2016  . Coronary artery disease involving native coronary artery of native heart with angina pectoris ([place]) 03/29/2017  . Cough 10/17/2016   Last Assessment & Plan:  Discussed typical course for acute viral illness. If symptoms worsen or fail to improve by 7-10d, delayed ATBs, fluids, rest, NSAIDs/APAP prn. Seek care if not improving. Needs earlier INR check due to ATBs.  [name] Dyspnea  02/01/2016   Last Assessment & Plan:  Overall improving, eval by pulm, plan for CT, neg stress test with cardiology. Recent switch to carvedilol due to side effects.  . Epidermoid cyst of skin 08/24/2017  . Essential hypertension 12/14/2015   Last Assessment & Plan:  Hypertension control: controlled  Medications: compliant Medication Management: as noted in orders Home blood pressure monitoring recommended additionally as needed for symptoms  The patient's care plan was reviewed and updated. Instructions and counseling were provided regarding patient goals and barriers. He was counseled to adopt a healthy lifestyle. Educational resources and self-management tools have been provided as charted in [facility] list.   . H/O maze procedure 03/29/2017  . [name] 03/29/2017   Overview:  2011  . Hx of CABG 03/29/2017  . Hyperlipidemia 03/29/2017  . Hypertensive heart disease 03/29/2017  . Kidney stones 07/23/2015   Overview:  x 3  Last Assessment & Plan:  By [place] has left nephrolithiasis, but not visible by KUB. Will check CT renal colic next year to assess both stone burden as well as to surveil AML.   . Leukocytoclastic vasculitis ([place]) 10/01/2017  . Localized edema 01/04/2018  . Lumbar radicular pain 01/19/2016  . Maculopapular rash 09/03/2017    . Nephrolithiasis 07/23/2015   Overview:  x 3  Last Assessment & Plan:  By [place] has left nephrolithiasis, but not visible by KUB. Will check CT renal colic next year to assess both stone burden as well as to surveil AML.   Overview:  x 3  Last Assessment & Plan:  Has 27m nonobstructing LUP stone - not visible by KUB.  Will check renal [place] - he will contact office sooner if symptomatic.   . Non-sustained ventricular tachycardia (HT[place]ias 03/29/2017  . Other hyperlipidemia 03/29/2017  . Palpitations 10/01/2017  . Paroxysmal atrial fibrillation ([place] 03/29/2017  . Paroxysmal atrial fibrillation ([place] 03/29/2017  . Pleural effusion, bilateral 08/09/2017  . Post-nasal drainage 02/25/2016   Last Assessment & Plan:  Trial zyrtec and flonase  . Prostate cancer screening 06/20/2017   Last Assessment & Plan:  Recommend continued annual CaP screening until within 10 years of life expectancy. Given good health and fhx of longevity, would anticipate CaP screening to continue until age 72  PSA today and again in one year on day of visit.  . Pulmonary hypertension ([place] 08/09/2017  . Pulmonary nodules 06/12/2016  . S/P AVR (aortic valve replacement) 03/20/2016  . Supratherapeutic INR 07/26/2017  . Syncope 03/29/2017  . Syncope and collapse 02/01/2016  . Typical atrial flutter ([place] 02/01/2016    Past Surgical History: Past Surgical History:  Procedure Laterality Date  . CHOLECYSTECTOMY    . CORONARY ARTERY BYPASS GRAFT    . EXPLORATORY LAPAROTOMY  07/30/2017  . FOOT SURGERY    . FRACTURE SURGERY Right    wrist and forearm  . HERNIA REPAIR    . MECHANICAL AORTIC VALVE REPLACEMENT    . NASAL SINUS SURGERY    . RIGHT HEART CATH N/A 01/18/2018   Procedure: RIGHT HEART CATH;  Surgeon: BJolaine Artist MD;  [facility];  Service: Cardiovascular;  Laterality: N/A;  . VASECTOMY      Family History:  Family History  Problem Relation Age of Onset  . Asthma Mother   . Arthritis Mother   . Heart attack  Father   . Hypertension Father   . Stroke Paternal Grandmother     Social History: Social History  Socioeconomic History  . Marital status: Single    Spouse name: Not on file  . Number of children: Not on file  . Years of education: Not on file  . Highest education level: Not on file  Occupational History  . Not on file  Social Needs  . Financial resource strain: Not on file  . Food insecurity:    Worry: Not on file    Inability: Not on file  . Transportation needs:    Medical: Not on file    Non-medical: Not on file  Tobacco Use  . Smoking status: Former Smoker    Packs/day: 2.00    Years: 34.00    Pack years: 68.00    Types: Cigarettes    Last attempt to quit: 07/16/2000    Years since quitting: 17.8  . Smokeless tobacco: Never Used  Substance and Sexual Activity  . Alcohol use: Yes  . Drug use: No  . Sexual activity: Not on file  Lifestyle  . Physical activity:    Days per week: Not on file    Minutes per session: Not on file  . Stress: Not on file  Relationships  . Social connections:    Talks on phone: Not on file    Gets together: Not on file    Attends religious service: Not on file    Active member of club or organization: Not on file    Attends meetings of clubs or organizations: Not on file    Relationship status: Not on file  Other Topics Concern  . Not on file  Social History Narrative  . Not on file    Allergies:  Allergies  Allergen Reactions  . Amoxicillin-Pot Clavulanate Other (See Comments)    Stomach pain Has patient had a PCN reaction causing immediate rash, facial/tongue/throat swelling, SOB or lightheadedness with hypotension: No Has patient had a PCN reaction causing severe rash involving mucus membranes or skin necrosis: No Has patient had a PCN reaction that required hospitalization: No Has patient had a PCN reaction occurring within the last 10 years: Yes If all of the above answers are "NO", then may proceed with Cephalosporin  use.   . Tape Rash and Other (See Comments)    Surgical tape    Objective:    Vital Signs:   Temp:  [97.5 F (36.4 C)] 97.5 F (36.4 C) (05/21 1145) Resp:  [19-22] 19 (05/21 1200) BP: (107-114)/(73-76) 107/76 (05/21 1200) SpO2:  [93 %-97 %] 97 % (05/21 1200) Weight:  [166 lb 0.1 oz (75.3 kg)] 166 lb 0.1 oz (75.3 kg) (05/21 1145) Last BM Date: 05/14/18 Filed Weights   05/14/18 1145  Weight: 166 lb 0.1 oz (75.3 kg)     Physical Exam     General:  Fatigued appearing.  HEENT: Perioral cyanosis otherwise normal Neck: Supple. JVP elevated. Carotids 2+ bilat; no bruits. No lymphadenopathy or thyromegaly appreciated. Cor: PMI nondisplaced. Irregularly irregular. 3/6 TR. +RV lift. Loud P2.  Lungs: Diminished throughout. No wheeze Abdomen: Soft, nontender, nondistended. No hepatosplenomegaly. No bruits or masses. Good bowel sounds. Extremities: No clubbing, or rash. Cool to the touch with 3+ edema into thighs. Peripheral cyanosis Neuro: Alert & oriented x 3, cranial nerves grossly intact. moves all 4 extremities w/o difficulty. Affect pleasant.   Telemetry   Afib 70s, personally reviewed.  EKG   No new tracings.    Labs     Basic Metabolic Panel: No results for input(s): NA, K, CL, CO2, GLUCOSE, BUN, CREATININE, CALCIUM, MG,  PHOS in the last 168 hours.  Liver Function Tests: No results for input(s): AST, ALT, ALKPHOS, BILITOT, PROT, ALBUMIN in the last 168 hours. No results for input(s): LIPASE, AMYLASE in the last 168 hours. No results for input(s): AMMONIA in the last 168 hours.  CBC: No results for input(s): WBC, NEUTROABS, HGB, HCT, MCV, PLT in the last 168 hours.  Cardiac Enzymes: No results for input(s): CKTOTAL, CKMB, CKMBINDEX, TROPONINI in the last 168 hours.  BNP: BNP (last 3 results) Recent Labs    01/09/18 1705  BNP 668.0*    ProBNP (last 3 results) No results for input(s): PROBNP in the last 8760 hours.   CBG: No results for input(s): GLUCAP  in the last 168 hours.  Coagulation Studies: No results for input(s): LABPROT, INR in the last 72 hours.  Imaging: No results found.   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 Also has COPD (quit smoking in 2002 - 1 ppd x 25 years), HL, HTN  Directly admitted 05/14/18 with concerns for low output CHF.   Assessment/Plan   1. Pulmonary hypertension with cor pulmonale - Previous RHC with mod/severe pulm HTN with RV failure.  - Presented with worsening functional status, volume overload and fatigued. Significant signs of RV failure on exam with NYHA IIIb-IV symptoms.  - Continue sildenafil 20 mg TID.  - Continue macitentan - Will take to cath lab for Swan, and possible transfer to [place] for IV prostanoid - Serologies to date have been negative. Rheum appointment pending   2. Chronic respiratory failure - Plan as above.   3. Chronic AFL - Rate controlled on lopressor.  - Continue coumadin with mechanical AVR. INR pending.   4. CAD - s/p CABG.  - No s/s of ischemia.    - continue ASA. Will need statin  5. S/p mechanical AVR - Stable on most recent echo. Continue coumadin/ASA 81 - aware of need for SBE prophylaxis  6. Leukocytoclastic vasculitis - f/u with Rheumatology - No change to current plan.    Pt critically ill with pulmonary HTN and RV failure. Now with low output. Will need to go to cath lab for [place] and swan. May need transport to [facility] for IV [facility] treatment.    Shirley Friar, PA-C 05/14/2018, 12:35 PM  Advanced Heart Failure Team Pager [phone] (M-F; 7a - 4p)  Please contact [facility] for night-coverage after hours (4p -7a ) and weekends on [email or web address]  Agree with above  31 y/o with CAD s/p CABG/mechanical AVR, permanent AF, COPD with recent diagnosis of  severe PAH (WHO Group I) on dual oral therapy. Now with progressive Class IV symptoms including abdomianl symptoms and syncope. Marked edema and RV failure on exam.   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 Suspect he will need IV therapy. Will take to cath lab and place swan. Likely start milrinone. Once hemodynamics back can discuss transfer to [facility] for IV Flolan. Will need AC for mechanical AVR.   CRITICAL CARE Performed by: Glori Bickers  Total critical care time: 35 minutes  Critical care time was exclusive of separately billable procedures and treating  other patients.  Critical care was necessary to treat or prevent imminent or life-threatening deterioration.  Critical care was time spent personally by me (independent of midlevel providers or residents) on the following activities: development of treatment plan with patient and/or surrogate as well as nursing, discussions with consultants, evaluation of patient's response to treatment, examination of patient, obtaining history from patient or surrogate, ordering and performing treatments and interventions, ordering and review of laboratory studies, ordering and review of radiographic studies, pulse oximetry and re-evaluation of patient's condition.  Glori Bickers, MD  3:38 PM

## 2018-05-14 NOTE — Progress Notes (Addendum)
ANTICOAGULATION CONSULT NOTE - Initial Consult  Pharmacy Consult for warfarin Indication: atrial fibrillation  Allergies  Allergen Reactions  . Amoxicillin-Pot Clavulanate Other (See Comments)    Stomach pain Has patient had a PCN reaction causing immediate rash, facial/tongue/throat swelling, SOB or lightheadedness with hypotension: No Has patient had a PCN reaction causing severe rash involving mucus membranes or skin necrosis: No Has patient had a PCN reaction that required hospitalization: No Has patient had a PCN reaction occurring within the last 10 years: Yes If all of the above answers are "NO", then may proceed with Cephalosporin use.   . Tape Rash and Other (See Comments)    Surgical tape    Patient Measurements: Weight: 166 lb 0.1 oz (75.3 kg)   Vital Signs: Temp: 97.5 F (36.4 C) (05/21 1145) Temp Source: Oral (05/21 1145) BP: 95/65 (05/21 1700) Pulse Rate: 71 (05/21 1700)  Labs: Recent Labs    05/14/18 1310  HGB 11.1*  HCT 34.4*  PLT 213  LABPROT 22.0*  INR 1.94  CREATININE 1.39*    Estimated Creatinine Clearance: 48.7 mL/min (A) (by C-G formula based on SCr of 1.39 mg/dL (H)).   Medical History: Past Medical History:  Diagnosis Date  . 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 In light of concurrent left nephrolithiasis, will check CT renal colic next year instead of renal US.   [name] Anticoagulated on Coumadin 01/04/2018  . Anxiety 05/10/2016   Last Assessment & Plan:  Doing well off of zoloft.  . Asymptomatic microscopic hematuria 06/20/2017   Last Assessment & Plan:  Had hematuria workup in [facility] in 2016 which negative CT and cystoscopy. UA with 2+ blood last visit - we discussed recommendation for repeat workup at 5 years or if degree of hematuria progresses.   . Atrial fibrillation ([place]) 03/29/2017  . Atrial flutter ([place]) 03/29/2017  . Chronic allergic rhinitis 04/28/2016   Last Assessment & Plan:   Continue astelin  . Chronic anticoagulation 03/29/2017  . Chronic atrial fibrillation ([place]) 07/23/2015   Last Assessment & Plan:  Coumadin and metoprolol, cardiology referral to establish care.  . Chronic midline back pain 12/14/2015   Last Assessment & Plan:  Pain management referral for further evaluation.  . Chronic prostatitis 07/23/2015   Last Assessment & Plan:  Has largely resolved since stopping bike riding. Recommend annual DRE AND PSA - will see back 12/2015 for annual screening, given 1st degree fhx. To call office for recurrent prostatitis symptoms.   [name] COPD (chronic obstructive pulmonary disease) ([place]) 06/12/2016  . Coronary artery disease involving native coronary artery of native heart with angina pectoris ([place]) 03/29/2017  . Cough 10/17/2016   Last Assessment & Plan:  Discussed typical course for acute viral illness. If symptoms worsen or fail to improve by 7-10d, delayed ATBs, fluids, rest, NSAIDs/APAP prn. Seek care if not improving. Needs earlier INR check due to ATBs.  [name] Dyspnea 02/01/2016   Last Assessment & Plan:  Overall improving, eval by pulm, plan for CT, neg stress test with cardiology. Recent switch to carvedilol due to side effects.  . Epidermoid cyst of skin 08/24/2017  . Essential hypertension 12/14/2015   Last Assessment & Plan:  Hypertension control: controlled  Medications: compliant Medication Management: as noted in orders Home blood pressure monitoring recommended additionally as needed for symptoms  The patient's care plan was reviewed and updated. Instructions and counseling were provided regarding patient goals and barriers. He was  counseled to adopt a healthy lifestyle. Educational resources and self-management tools have been provided as charted in [facility] list.   . H/O maze procedure 03/29/2017  . [name] 03/29/2017   Overview:  2011  . Hx of CABG 03/29/2017  . Hyperlipidemia 03/29/2017  . Hypertensive heart disease 03/29/2017  . Kidney stones  07/23/2015   Overview:  x 3  Last Assessment & Plan:  By [place] has left nephrolithiasis, but not visible by KUB. Will check CT renal colic next year to assess both stone burden as well as to surveil AML.   . 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 Localized edema 01/04/2018  . Lumbar radicular pain 01/19/2016  . Maculopapular rash 09/03/2017  . Nephrolithiasis 07/23/2015   Overview:  x 3  Last Assessment & Plan:  By [place] has left nephrolithiasis, but not visible by KUB. Will check CT renal colic next year to assess both stone burden as well as to surveil AML.   Overview:  x 3  Last Assessment & Plan:  Has 60m nonobstructing LUP stone - not visible by KUB.  Will check renal [place]2019 - he will contact office sooner if symptomatic.   . Non-sustained ventricular tachycardia ([place] 03/29/2017  . Other hyperlipidemia 03/29/2017  . Palpitations 10/01/2017  . Paroxysmal atrial fibrillation ([place] 03/29/2017  . Paroxysmal atrial fibrillation ([place] 03/29/2017  . Pleural effusion, bilateral 08/09/2017  . Post-nasal drainage 02/25/2016   Last Assessment & Plan:  Trial zyrtec and flonase  . Prostate cancer screening 06/20/2017   Last Assessment & Plan:  Recommend continued annual CaP screening until within 10 years of life expectancy. Given good health and fhx of longevity, would anticipate CaP screening to continue until age 89  PSA today and again in one year on day of visit.  . Pulmonary hypertension ([place]ok[place] 08/09/2017  . Pulmonary nodules 06/12/2016  . S/P AVR (aortic valve replacement) 03/20/2016  . Supratherapeutic INR 07/26/2017  . Syncope 03/29/2017  . Syncope and collapse 02/01/2016  . Typical atrial flutter ([facility]. [facility] 02/01/2016   Assessment: 72year old male with PAH for RHC today. Patient on warfarin prior to admit for afib, mechanical AVR. INR 1.94 this morning. Orders to restart post cath.   PTA Dose = 2.521md except 49m62mat  Goal of Therapy:  (INR goal 2.5-3.5 per PCP) Monitor platelets by anticoagulation protocol:  Yes   Plan:  Warfarin 49mg71mnight Daily INR   FranErin HearingrmD., BCPS Clinical Pharmacist 05/14/2018 5:26 PM  Addendum: Discussed low INR and mechanical AVR with cardiology, will bridge with IV heparin. No bolus given recent RHC.   05/14/2018 6:45 PM

## 2018-05-14 NOTE — Interval H&P Note (Signed)
History and Physical Interval Note:  05/14/2018 3:47 PM  Lance Hicks  has presented today for surgery, with the diagnosis of pulmonary hypertension with core pulmonale  The various methods of treatment have been discussed with the patient and family. After consideration of risks, benefits and other options for treatment, the patient has consented to  Procedure(s): RIGHT HEART CATH (N/A) as a surgical intervention .  The patient's history has been reviewed, patient examined, no change in status, stable for surgery.  I have reviewed the patient's chart and labs.  Questions were answered to the patient's satisfaction.     Mysty Kielty

## 2018-05-15 ENCOUNTER — Encounter (HOSPITAL_COMMUNITY): Payer: Self-pay | Admitting: Internal Medicine

## 2018-05-15 DIAGNOSIS — J432 Centrilobular emphysema: Secondary | ICD-10-CM

## 2018-05-15 DIAGNOSIS — I272 Pulmonary hypertension, unspecified: Secondary | ICD-10-CM

## 2018-05-15 DIAGNOSIS — R600 Localized edema: Secondary | ICD-10-CM

## 2018-05-15 LAB — BASIC METABOLIC PANEL
Anion gap: 8 (ref 5–15)
BUN: 24 mg/dL — ABNORMAL HIGH (ref 6–20)
CO2: 22 mmol/L (ref 22–32)
Calcium: 8.2 mg/dL — ABNORMAL LOW (ref 8.9–10.3)
Chloride: 105 mmol/L (ref 101–111)
Creatinine, Ser: 1.32 mg/dL — ABNORMAL HIGH (ref 0.61–1.24)
GFR calc Af Amer: >60 mL/min (ref >60)
GFR calc non Af Amer: 53 mL/min — ABNORMAL LOW (ref >60)
Glucose, Bld: 71 mg/dL (ref 65–99)
Potassium: 3.8 mmol/L (ref 3.5–5.1)
Sodium: 135 mmol/L (ref 135–145)

## 2018-05-15 LAB — CBC
HCT: 31.5 % — ABNORMAL LOW (ref 39.0–52.0)
Hemoglobin: 10.2 g/dL — ABNORMAL LOW (ref 13.0–17.0)
MCH: 27.6 pg (ref 26.0–34.0)
MCHC: 32.4 g/dL (ref 30.0–36.0)
MCV: 85.1 fL (ref 78.0–100.0)
Platelets: 172 10*3/uL (ref 150–400)
RBC: 3.7 MIL/uL — ABNORMAL LOW (ref 4.22–5.81)
RDW: 17.3 % — ABNORMAL HIGH (ref 11.5–15.5)
WBC: 4.8 10*3/uL (ref 4.0–10.5)

## 2018-05-15 LAB — COOXEMETRY PANEL
Carboxyhemoglobin: 2.4 % — ABNORMAL HIGH (ref 0.5–1.5)
Methemoglobin: 1.4 % (ref 0.0–1.5)
O2 Saturation: 63.2 %
Total hemoglobin: 10.5 g/dL — ABNORMAL LOW (ref 12.0–16.0)

## 2018-05-15 LAB — PROTIME-INR
INR: 1.98
Prothrombin Time: 22.4 seconds — ABNORMAL HIGH (ref 11.4–15.2)

## 2018-05-15 LAB — HEPARIN LEVEL (UNFRACTIONATED)
Heparin Unfractionated: <0.1 IU/mL — ABNORMAL LOW (ref 0.30–0.70)
Heparin Unfractionated: 0.19 IU/mL — ABNORMAL LOW (ref 0.30–0.70)

## 2018-05-15 LAB — AMMONIA: Ammonia: 80 umol/L — ABNORMAL HIGH (ref 9–35)

## 2018-05-15 LAB — TSH: TSH: 2.768 u[IU]/mL (ref 0.350–4.500)

## 2018-05-15 LAB — T4, FREE: Free T4: 1.36 ng/dL (ref 0.82–1.77)

## 2018-05-15 MED ORDER — WARFARIN SODIUM 5 MG PO TABS
5.0000 mg | ORAL_TABLET | Freq: Once | ORAL | Status: AC
  Administered 2018-05-15: 5 mg via ORAL
  Filled 2018-05-15: qty 1

## 2018-05-15 MED ORDER — MIDODRINE HCL 5 MG PO TABS
5.0000 mg | ORAL_TABLET | Freq: Three times a day (TID) | ORAL | Status: DC
  Administered 2018-05-15 – 2018-05-18 (×11): 5 mg via ORAL
  Filled 2018-05-15 (×10): qty 1

## 2018-05-15 NOTE — Progress Notes (Signed)
MD made aware of systolic b/p dropping to 65'H. MAP >60 and pt is asymptomatic. Will continue to monitor condition and update MD as needed.
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
## 2018-05-15 NOTE — Progress Notes (Addendum)
Advanced Heart Failure Rounding Note  PCP-Cardiologist: No primary care provider on file.   Subjective:    RHC 05/14/18 showed Mild/Moderate PAH in setting of high output with no evidence of intracardiac shunting. Hemodynamics as below.   Pressures soft overnight. Down into 83-38S systolic.  Negative 2L and down 4 lbs.   Fatigued. Denies SOB. Remains lightheaded at times. Continues to have peripheral edema.   Swan numbers PA 55/15 CVP 14 CO 4.55 CI 2.41  RHC 05/14/18 RA = 18 RV = 71/17 PA = 69/24 (39) PCW = 20 Fick cardiac output/index = 7.0/3.7 Thermo CO/CI = 7.2/3.8 PVR = 2.6 WU Ao sat = 97% PA sat =  73%, 73% SVC sat = 73%  RA sat = 72%  Objective:   Weight Range: 162 lb 11.2 oz (73.8 kg) Body mass index is 24.74 kg/m.   Vital Signs:   Temp:  [97.5 F (36.4 C)-100 F (37.8 C)] 98.8 F (37.1 C) (05/22 0700) Pulse Rate:  [0-110] 71 (05/21 1700) Resp:  [0-29] 25 (05/22 0700) BP: (75-128)/(53-80) 90/55 (05/22 0700) SpO2:  [0 %-100 %] 92 % (05/22 0700) Weight:  [162 lb 11.2 oz (73.8 kg)-166 lb 0.1 oz (75.3 kg)] 162 lb 11.2 oz (73.8 kg) (05/22 0500) Last BM Date: 05/14/18  Weight change: Filed Weights   05/14/18 1145 05/15/18 0500  Weight: 166 lb 0.1 oz (75.3 kg) 162 lb 11.2 oz (73.8 kg)    Intake/Output:   Intake/Output Summary (Last 24 hours) at 05/15/2018 0747 Last data filed at 05/15/2018 0700 Gross per 24 hour  Intake 109.37 ml  Output 2075 ml  Net -1965.63 ml      Physical Exam    General:  Fatigued appearing.  HEENT: Perioral cyanosis otherwise normal Neck: Supple. JVP elevated. Carotids 2+ bilat; no bruits. No lymphadenopathy or thyromegaly appreciated. Cor: PMI nondisplaced. Irregularly irregular. 3/6 TR. +RV lift. Loud P2.  Lungs: Diminished throughout. No wheeze Abdomen: Soft, nontender, nondistended. No hepatosplenomegaly. No bruits or masses. Good bowel sounds. Extremities: No clubbing, or rash. Cool to the touch with 3+ edema into  thighs. Peripheral cyanosis Neuro: Alert & oriented x 3, cranial nerves grossly intact. moves all 4 extremities w/o difficulty. Affect pleasant.   Telemetry   Afib 60-70s, personally reviewed  EKG    No new tracings.    Labs    CBC Recent Labs    05/14/18 1310 05/15/18 0549  WBC 6.3 4.8  NEUTROABS 5.0  --   HGB 11.1* 10.2*  HCT 34.4* 31.5*  MCV 85.4 85.1  PLT 213 505   Basic Metabolic Panel Recent Labs    05/14/18 1310 05/15/18 0549  NA 133* 135  K 3.2* 3.8  CL 100* 105  CO2 23 22  GLUCOSE 85 71  BUN 26* 24*  CREATININE 1.39* 1.32*  CALCIUM 8.9 8.2*  MG 1.9  --    Liver Function Tests Recent Labs    05/14/18 1310  AST 37  ALT 14*  ALKPHOS 78  BILITOT 4.8*  PROT 7.7  ALBUMIN 3.0*   No results for input(s): LIPASE, AMYLASE in the last 72 hours. Cardiac Enzymes No results for input(s): CKTOTAL, CKMB, CKMBINDEX, TROPONINI in the last 72 hours.  BNP: BNP (last 3 results) Recent Labs    01/09/18 1705 05/14/18 1310  BNP 668.0* 1,247.0*    ProBNP (last 3 results) No results for input(s): PROBNP in the last 8760 hours.   D-Dimer No results for input(s): DDIMER in the last 72 hours.  Hemoglobin A1C No results for input(s): HGBA1C in the last 72 hours. Fasting Lipid Panel No results for input(s): CHOL, HDL, LDLCALC, TRIG, CHOLHDL, LDLDIRECT in the last 72 hours. Thyroid Function Tests Recent Labs    05/14/18 1310  TSH 3.227    Other results:   Imaging    Dg Chest Port 1 View  Result Date: 05/14/2018 CLINICAL DATA:  Pulmonary edema EXAM: PORTABLE CHEST 1 VIEW COMPARISON:  None. FINDINGS: Post sternotomy changes. Right IJ Swan-Ganz catheter tip overlies the right central pulmonary artery. Valve prosthesis. Cardiomegaly with vascular congestion and mild edema. No pleural effusion. No pneumothorax. Old right-sided rib fractures. IMPRESSION: 1. Cardiomegaly with vascular congestion and mild interstitial edema Electronically Signed   By: Donavan Foil M.D.   On: 05/14/2018 19:56      Medications:     Scheduled Medications: . aspirin  81 mg Oral BH-q7a  . furosemide  80 mg Intravenous BID  . loratadine  10 mg Oral Daily  . macitentan  10 mg Oral Daily  . metoprolol tartrate  25 mg Oral BID  . sodium chloride flush  3 mL Intravenous Q12H  . sodium chloride flush  3 mL Intravenous Q12H  . spironolactone  12.5 mg Oral Daily  . tadalafil (PAH)  40 mg Oral Daily  . Warfarin - Pharmacist Dosing Inpatient   Does not apply q1800     Infusions: . sodium chloride    . sodium chloride    . 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 Also has COPD (quit smoking in 2002 - 1 ppd x 25 years), HL, HTN   Directly admitted 05/14/18 with concerns for low output CHF.   Assessment/Plan   1. Pulmonary hypertension with cor pulmonale - Previous RHC with mod/severe pulm HTN with RV failure.   - RHC 05/14/18 with Mild/Moderate PAH in setting of high output with no evidence of intracardiac shunting. - Volume status remains elevated. CVP 14 - Continue lasix 80 mg BID - Continue adcirca 40 mg daily.  - Continue macitentan 10 mg daily - Continue toprol 25 mg BID. Holding dose this am with soft pressures.   - Start midodrine 5 mg TID - Serologies to date have been negative. Rheum appointment pending  - Consider bubble study.    2. Chronic respiratory failure - Plan as above.    3. Chronic AFL - Rate controlled on lopressor.  - Continue coumadin with mechanical AVR.  - INR pending.   4. CAD - s/p CABG.  - No s/s of ischemia.     - continue ASA. Will need statin   5. S/p mechanical AVR - Stable on most recent echo. Continue coumadin/ASA 81 - Aware of need for SBE prophylaxis   6. Leukocytoclastic vasculitis - f/u with Rheumatology - No change to current plan.    Remains tenuous. Low threshold to consider tx to [facility] for IV Flolan, though cardiac output much higher than expected.   Medication concerns reviewed with patient and pharmacy team. Barriers identified: None at this time.   Length of Stay: 1  Harshaan Whang, [place]  05/15/2018, 7:47  AM  Advanced Heart Failure Team Pager [phone] (M-F; 7a - 4p)  Please contact [facility] for night-coverage after hours (4p -7a ) and weekends on [email or web address]  Agree with above.  He has responded well to IV lasix with significant reduction in pulmonary pressure however systemic pressures has dropped. Creatinine stable.   JVP to ear RIJ swan Cor IRR mech s2 Lungs decreased throughout no wheeze  Ab soft NT Ext 2+ edema. + peripheral cyanosis  I am still unclear of what his primary issue is. Last RHC with significant PAH and low output. This RHC with stable PAH but high output. RHC with no evidence of intracardiac shunting to explain high output. Cyanosis out of proportion to [facility] and hypoxia. Will check TSH, thiamine and bubble study. Repeat hi-res CT looking PVOD. Consider cirrhosis w/u.  Will d/w [facility]. Continue diuresis. Add midodrine for BP support. Resume coumadin for AVR.  CRITICAL CARE Performed by: Glori Bickers  Total critical care time: 35 minutes  Critical care time was exclusive of separately billable procedures and treating other patients.  Critical care was necessary to treat or prevent imminent or life-threatening deterioration.  Critical care was time spent personally by me (independent of midlevel providers or residents) on the following activities: development of treatment plan with patient and/or surrogate as well as nursing,  discussions with consultants, evaluation of patient's response to treatment, examination of patient, obtaining history from patient or surrogate, ordering and performing treatments and interventions, ordering and review of laboratory studies, ordering and review of radiographic studies, pulse oximetry and re-evaluation of patient's condition.  Glori Bickers, MD  8:20 AM

## 2018-05-15 NOTE — Progress Notes (Signed)
ANTICOAGULATION CONSULT NOTE - Follow Up Consult  Pharmacy Consult for heparin Indication: Afib/AVR  Labs: Recent Labs    05/14/18 1310 05/15/18 0549  HGB 11.1* 10.2*  HCT 34.4* 31.5*  PLT 213 172  LABPROT 22.0*  --   INR 1.94  --   HEPARINUNFRC  --  <0.10*  CREATININE 1.39* 1.32*    Assessment: 72yo male subtherapeutic on heparin with initial dosing post-cath while INR low; RN reports no gtt issues or signs of bleeding overnight.  Goal of Therapy:  Heparin level 0.3-0.7 units/ml   Plan:  Will increase heparin gtt by 4 units/kg/hr to 1300 units/hr and check level in 8 hours.    Wynona Neat, PharmD, BCPS  05/15/2018,6:56 AM

## 2018-05-15 NOTE — Progress Notes (Signed)
ANTICOAGULATION CONSULT NOTE - [name] for warfarin/Heparin Indication: atrial fibrillation, mechanical AVR  Allergies  Allergen Reactions  . Amoxicillin-Pot Clavulanate Other (See Comments)    Stomach pain Has patient had a PCN reaction causing immediate rash, facial/tongue/throat swelling, SOB or lightheadedness with hypotension: No Has patient had a PCN reaction causing severe rash involving mucus membranes or skin necrosis: No Has patient had a PCN reaction that required hospitalization: No Has patient had a PCN reaction occurring within the last 10 years: Yes If all of the above answers are "NO", then may proceed with Cephalosporin use.   . Tape Rash and Other (See Comments)    Surgical tape    Patient Measurements: Height: _0  (172.7 cm) Weight: 162 lb 11.2 oz (73.8 kg) IBW/kg (Calculated) : 68.4   Vital Signs: Temp: 98.2 F (36.8 C) (05/22 1100) Temp Source: Core (05/22 1100) BP: 97/58 (05/22 1100)  Labs: Recent Labs    05/14/18 1310 05/15/18 0549  HGB 11.1* 10.2*  HCT 34.4* 31.5*  PLT 213 172  LABPROT 22.0* 22.4*  INR 1.94 1.98  HEPARINUNFRC  --  <0.10*  CREATININE 1.39* 1.32*    Estimated Creatinine Clearance: 49.7 mL/min (A) (by C-G formula based on SCr of 1.32 mg/dL (H)).   Medical History: Past Medical History:  Diagnosis Date  . 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 In light of concurrent left nephrolithiasis, will check CT renal colic next year instead of renal US.   [name] Anticoagulated on Coumadin 01/04/2018  . Anxiety 05/10/2016   Last Assessment & Plan:  Doing well off of zoloft.  . Asymptomatic microscopic hematuria 06/20/2017   Last Assessment & Plan:  Had hematuria workup in [facility] in 2016 which negative CT and cystoscopy. UA with 2+ blood last visit - we discussed recommendation for repeat workup at 5 years or if degree of hematuria progresses.   . Atrial  fibrillation ([place]) 03/29/2017  . Atrial flutter ([place]) 03/29/2017  . Chronic allergic rhinitis 04/28/2016   Last Assessment & Plan:  Continue astelin  . Chronic anticoagulation 03/29/2017  . Chronic atrial fibrillation ([place]) 07/23/2015   Last Assessment & Plan:  Coumadin and metoprolol, cardiology referral to establish care.  . Chronic midline back pain 12/14/2015   Last Assessment & Plan:  Pain management referral for further evaluation.  . Chronic prostatitis 07/23/2015   Last Assessment & Plan:  Has largely resolved since stopping bike riding. Recommend annual DRE AND PSA - will see back 12/2015 for annual screening, given 1st degree fhx. To call office for recurrent prostatitis symptoms.   [name] COPD (chronic obstructive pulmonary disease) ([place]) 06/12/2016  . Coronary artery disease involving native coronary artery of native heart with angina pectoris ([place]) 03/29/2017  . Cough 10/17/2016   Last Assessment & Plan:  Discussed typical course for acute viral illness. If symptoms worsen or fail to improve by 7-10d, delayed ATBs, fluids, rest, NSAIDs/APAP prn. Seek care if not improving. Needs earlier INR check due to ATBs.  [name] Dyspnea 02/01/2016   Last Assessment & Plan:  Overall improving, eval by pulm, plan for CT, neg stress test with cardiology. Recent switch to carvedilol due to side effects.  . Epidermoid cyst of skin 08/24/2017  . Essential hypertension 12/14/2015   Last Assessment & Plan:  Hypertension control: controlled  Medications: compliant Medication Management: as noted in orders Home blood pressure monitoring recommended additionally as needed for symptoms  The patient's care plan was reviewed and updated. Instructions and counseling were provided regarding patient goals and barriers. He was counseled to adopt a healthy lifestyle. Educational resources and self-management tools have been provided as charted in [facility] list.   . H/O maze procedure 03/29/2017  . [name] 03/29/2017    Overview:  2011  . Hx of CABG 03/29/2017  . Hyperlipidemia 03/29/2017  . Hypertensive heart disease 03/29/2017  . Kidney stones 07/23/2015   Overview:  x 3  Last Assessment & Plan:  By [place] has left nephrolithiasis, but not visible by KUB. Will check CT renal colic next year to assess both stone burden as well as to surveil AML.   . Leukocytoclastic vasculitis ([place]) 10/01/2017  . Localized edema 01/04/2018  . Lumbar radicular pain 01/19/2016  . Maculopapular rash 09/03/2017  . Nephrolithiasis 07/23/2015   Overview:  x 3  Last Assessment & Plan:  By [place] has left nephrolithiasis, but not visible by KUB. Will check CT renal colic next year to assess both stone burden as well as to surveil AML.   Overview:  x 3  Last Assessment & Plan:  Has 84m nonobstructing LUP stone - not visible by KUB.  Will check renal [place] - he will contact office sooner if symptomatic.   . Non-sustained ventricular tachycardia ([place] 03/29/2017  . Other hyperlipidemia 03/29/2017  . Palpitations 10/01/2017  . Paroxysmal atrial fibrillation ([place] 03/29/2017  . Paroxysmal atrial fibrillation ([place] 03/29/2017  . Pleural effusion, bilateral 08/09/2017  . Post-nasal drainage 02/25/2016   Last Assessment & Plan:  Trial zyrtec and flonase  . Prostate cancer screening 06/20/2017   Last Assessment & Plan:  Recommend continued annual CaP screening until within 10 years of life expectancy. Given good health and fhx of longevity, would anticipate CaP screening to continue until age 89  PSA today and again in one year on day of visit.  . Pulmonary hypertension ([place] 08/09/2017  . Pulmonary nodules 06/12/2016  . S/P AVR (aortic valve replacement) 03/20/2016  . Supratherapeutic INR 07/26/2017  . Syncope 03/29/2017  . Syncope and collapse 02/01/2016  . Typical atrial flutter ([place] 02/01/2016   Assessment: 72yoM on warfarin for AFib and mechanical AVR presents with SOB. INR subtherapeutic on admit, started on IV heparin bridge. INR subtherapeutic at 1.98 this  morning.   PTA Dose = 2.568md except 59m41mat  Goal of Therapy:  (INR goal 2.5-3.5 per PCP) Monitor platelets by anticoagulation protocol: Yes   Plan:  -Warfarin 59mg28main tonight -Continue heparin 1300 units/hr -Rechecking heparin level this afternoon -Daily INR, heparin level, CBC  MichArrie SenatearmD, BCPS PGY-2 Cardiology Pharmacy Resident Pager: [phone]

## 2018-05-15 NOTE — Progress Notes (Signed)
ANTICOAGULATION CONSULT NOTE - [place] for warfarin/Heparin Indication: atrial fibrillation, mechanical AVR  Allergies  Allergen Reactions  . Amoxicillin-Pot Clavulanate Other (See Comments)    Stomach pain Has patient had a PCN reaction causing immediate rash, facial/tongue/throat swelling, SOB or lightheadedness with hypotension: No Has patient had a PCN reaction causing severe rash involving mucus membranes or skin necrosis: No Has patient had a PCN reaction that required hospitalization: No Has patient had a PCN reaction occurring within the last 10 years: Yes If all of the above answers are "NO", then may proceed with Cephalosporin use.   . Tape Rash and Other (See Comments)    Surgical tape    Patient Measurements: Height: _0  (172.7 cm) Weight: 162 lb 11.2 oz (73.8 kg) IBW/kg (Calculated) : 68.4   Vital Signs: Temp: 97.3 F (36.3 C) (05/22 1600) Temp Source: Core (Comment) (05/22 1600) BP: 107/77 (05/22 1600)  Labs: Recent Labs    05/14/18 1310 05/15/18 0549 05/15/18 1500  HGB 11.1* 10.2*  --   HCT 34.4* 31.5*  --   PLT 213 172  --   LABPROT 22.0* 22.4*  --   INR 1.94 1.98  --   HEPARINUNFRC  --  <0.10* 0.19*  CREATININE 1.39* 1.32*  --     Estimated Creatinine Clearance: 49.7 mL/min (A) (by C-G formula based on SCr of 1.32 mg/dL (H)).   Medical History: Past Medical History:  Diagnosis Date  . 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 In light of concurrent left nephrolithiasis, will check CT renal colic next year instead of renal US.   [name] Anticoagulated on Coumadin 01/04/2018  . Anxiety 05/10/2016   Last Assessment & Plan:  Doing well off of zoloft.  . Asymptomatic microscopic hematuria 06/20/2017   Last Assessment & Plan:  Had hematuria workup in [facility] in [facility] which negative CT and cystoscopy. UA with 2+ blood last visit - we discussed recommendation for repeat workup  at 5 years or if degree of hematuria progresses.   . Atrial fibrillation ([place]) 03/29/2017  . Atrial flutter ([place]) 03/29/2017  . Chronic allergic rhinitis 04/28/2016   Last Assessment & Plan:  Continue astelin  . Chronic anticoagulation 03/29/2017  . Chronic atrial fibrillation ([place]) 07/23/2015   Last Assessment & Plan:  Coumadin and metoprolol, cardiology referral to establish care.  . Chronic midline back pain 12/14/2015   Last Assessment & Plan:  Pain management referral for further evaluation.  . Chronic prostatitis 07/23/2015   Last Assessment & Plan:  Has largely resolved since stopping bike riding. Recommend annual DRE AND PSA - will see back 12/2015 for annual screening, given 1st degree fhx. To call office for recurrent prostatitis symptoms.   [name] COPD (chronic obstructive pulmonary disease) ([place]) 06/12/2016  . Coronary artery disease involving native coronary artery of native heart with angina pectoris ([place]) 03/29/2017  . Cough 10/17/2016   Last Assessment & Plan:  Discussed typical course for acute viral illness. If symptoms worsen or fail to improve by 7-10d, delayed ATBs, fluids, rest, NSAIDs/APAP prn. Seek care if not improving. Needs earlier INR check due to ATBs.  [name] Dyspnea 02/01/2016   Last Assessment & Plan:  Overall improving, eval by pulm, plan for CT, neg stress test with cardiology. Recent switch to carvedilol due to side effects.  . Epidermoid cyst of skin 08/24/2017  . Essential hypertension 12/14/2015   Last Assessment & Plan:  Hypertension  control: controlled  Medications: compliant Medication Management: as noted in orders Home blood pressure monitoring recommended additionally as needed for symptoms  The patient's care plan was reviewed and updated. Instructions and counseling were provided regarding patient goals and barriers. He was counseled to adopt a healthy lifestyle. Educational resources and self-management tools have been provided as charted in [facility] list.   . H/O maze procedure  03/29/2017  . [name] 03/29/2017   Overview:  2011  . Hx of CABG 03/29/2017  . Hyperlipidemia 03/29/2017  . Hypertensive heart disease 03/29/2017  . Kidney stones 07/23/2015   Overview:  x 3  Last Assessment & Plan:  By [place] has left nephrolithiasis, but not visible by KUB. Will check CT renal colic next year to assess both stone burden as well as to surveil AML.   . Leukocytoclastic vasculitis ([place]) 10/01/2017  . Localized edema 01/04/2018  . Lumbar radicular pain 01/19/2016  . Maculopapular rash 09/03/2017  . Nephrolithiasis 07/23/2015   Overview:  x 3  Last Assessment & Plan:  By [place] has left nephrolithiasis, but not visible by KUB. Will check CT renal colic next year to assess both stone burden as well as to surveil AML.   Overview:  x 3  Last Assessment & Plan:  Has 66m nonobstructing LUP stone - not visible by KUB.  Will check renal [place]2019 - he will contact office sooner if symptomatic.   . Non-sustained ventricular tachycardia ([place] 03/29/2017  . Other hyperlipidemia 03/29/2017  . Palpitations 10/01/2017  . Paroxysmal atrial fibrillation ([place] 03/29/2017  . Paroxysmal atrial fibrillation ([place] 03/29/2017  . Pleural effusion, bilateral 08/09/2017  . Post-nasal drainage 02/25/2016   Last Assessment & Plan:  Trial zyrtec and flonase  . Prostate cancer screening 06/20/2017   Last Assessment & Plan:  Recommend continued annual CaP screening until within 10 years of life expectancy. Given good health and fhx of longevity, would anticipate CaP screening to continue until age 72  PSA today and again in one year on day of visit.  . Pulmonary hypertension ([place] 08/09/2017  . Pulmonary nodules 06/12/2016  . S/P AVR (aortic valve replacement) 03/20/2016  . Supratherapeutic INR 07/26/2017  . Syncope 03/29/2017  . Syncope and collapse 02/01/2016  . Typical atrial flutter ([place] 02/01/2016   Assessment: 89yoM on warfarin for AFib and mechanical AVR presents with SOB. INR subtherapeutic on admit,  started on IV heparin bridge. INR subtherapeutic at 1.98 this morning.  -heparin level up to 0.19 after increase to 1300 units/hr  PTA Dose = 2.594md except 78m2mat  Goal of Therapy:  (INR goal 2.5-3.5 per PCP) Monitor platelets by anticoagulation protocol: Yes   Plan: -Increase heparin to 1500 units/hr -Heparin level in 8 hours and daily wth CBC daily  AndHildred LaserharmD Clinical Pharmacist 05/15/2018 4:42 PM

## 2018-05-15 NOTE — Progress Notes (Signed)
Orthopedic Tech Progress Note Patient Details:  Lance Hicks [birth year] [id number]  Ortho Devices Type of Ortho Device: Louretta Parma boot Ortho Device/Splint Location: bilateral Ortho Device/Splint Interventions: Application   Post Interventions Patient Tolerated: Well Instructions Provided: Care of device   [name] 05/15/2018, 3:05 PM

## 2018-05-16 ENCOUNTER — Encounter (HOSPITAL_COMMUNITY): Payer: Self-pay | Admitting: *Deleted

## 2018-05-16 ENCOUNTER — Other Ambulatory Visit: Payer: Self-pay

## 2018-05-16 ENCOUNTER — Inpatient Hospital Stay (HOSPITAL_COMMUNITY): Payer: Medicare Other

## 2018-05-16 DIAGNOSIS — I361 Nonrheumatic tricuspid (valve) insufficiency: Secondary | ICD-10-CM

## 2018-05-16 LAB — CBC
HCT: 33.9 % — ABNORMAL LOW (ref 39.0–52.0)
Hemoglobin: 11 g/dL — ABNORMAL LOW (ref 13.0–17.0)
MCH: 27.4 pg (ref 26.0–34.0)
MCHC: 32.4 g/dL (ref 30.0–36.0)
MCV: 84.3 fL (ref 78.0–100.0)
Platelets: 127 10*3/uL — ABNORMAL LOW (ref 150–400)
RBC: 4.02 MIL/uL — ABNORMAL LOW (ref 4.22–5.81)
RDW: 17.3 % — ABNORMAL HIGH (ref 11.5–15.5)
WBC: 5.8 10*3/uL (ref 4.0–10.5)

## 2018-05-16 LAB — MAGNESIUM: Magnesium: 1.8 mg/dL (ref 1.7–2.4)

## 2018-05-16 LAB — COOXEMETRY PANEL
Carboxyhemoglobin: 1.9 % — ABNORMAL HIGH (ref 0.5–1.5)
Methemoglobin: 1.6 % — ABNORMAL HIGH (ref 0.0–1.5)
O2 Saturation: 58.7 %
Total hemoglobin: 11.1 g/dL — ABNORMAL LOW (ref 12.0–16.0)

## 2018-05-16 LAB — HEPARIN LEVEL (UNFRACTIONATED)
Heparin Unfractionated: 0.26 IU/mL — ABNORMAL LOW (ref 0.30–0.70)
Heparin Unfractionated: 0.52 IU/mL (ref 0.30–0.70)
Heparin Unfractionated: 0.64 IU/mL (ref 0.30–0.70)

## 2018-05-16 LAB — BASIC METABOLIC PANEL
Anion gap: 9 (ref 5–15)
BUN: 25 mg/dL — ABNORMAL HIGH (ref 6–20)
CO2: 25 mmol/L (ref 22–32)
Calcium: 8.4 mg/dL — ABNORMAL LOW (ref 8.9–10.3)
Chloride: 99 mmol/L — ABNORMAL LOW (ref 101–111)
Creatinine, Ser: 1.24 mg/dL (ref 0.61–1.24)
GFR calc Af Amer: >60 mL/min (ref >60)
GFR calc non Af Amer: 57 mL/min — ABNORMAL LOW (ref >60)
Glucose, Bld: 90 mg/dL (ref 65–99)
Potassium: 3.6 mmol/L (ref 3.5–5.1)
Sodium: 133 mmol/L — ABNORMAL LOW (ref 135–145)

## 2018-05-16 LAB — SURGICAL PCR SCREEN
MRSA, PCR: NEGATIVE
Staphylococcus aureus: NEGATIVE

## 2018-05-16 LAB — T3, FREE: T3, Free: 1.7 pg/mL — ABNORMAL LOW (ref 2.0–4.4)

## 2018-05-16 LAB — PROTIME-INR
INR: 2.23
Prothrombin Time: 24.5 seconds — ABNORMAL HIGH (ref 11.4–15.2)

## 2018-05-16 MED ORDER — LIDOCAINE 5 % EX PTCH
1.0000 | MEDICATED_PATCH | CUTANEOUS | Status: DC
  Administered 2018-05-16 – 2018-05-19 (×4): 1 via TRANSDERMAL
  Filled 2018-05-16 (×7): qty 1

## 2018-05-16 MED ORDER — WARFARIN SODIUM 2.5 MG PO TABS
2.5000 mg | ORAL_TABLET | Freq: Once | ORAL | Status: AC
  Administered 2018-05-16: 2.5 mg via ORAL
  Filled 2018-05-16: qty 1

## 2018-05-16 MED ORDER — TADALAFIL 20 MG PO TABS
40.0000 mg | ORAL_TABLET | Freq: Every day | ORAL | Status: DC
  Administered 2018-05-17 – 2018-05-23 (×6): 40 mg via ORAL
  Filled 2018-05-16 (×7): qty 2

## 2018-05-16 MED ORDER — POTASSIUM CHLORIDE CRYS ER 20 MEQ PO TBCR
60.0000 meq | EXTENDED_RELEASE_TABLET | Freq: Once | ORAL | Status: AC
  Administered 2018-05-16: 60 meq via ORAL
  Filled 2018-05-16: qty 3

## 2018-05-16 NOTE — Progress Notes (Signed)
Cards fellow Charkavarti paged and made aware of hypotension while sleeping. Will continue to monitor closely. Eleonore Chiquito RN 2 Heart CVICU

## 2018-05-16 NOTE — Progress Notes (Signed)
ANTICOAGULATION CONSULT NOTE - [place] for warfarin/Heparin Indication: atrial fibrillation, mechanical AVR  Allergies  Allergen Reactions  . Amoxicillin-Pot Clavulanate Other (See Comments)    Stomach pain Has patient had a PCN reaction causing immediate rash, facial/tongue/throat swelling, SOB or lightheadedness with hypotension: No Has patient had a PCN reaction causing severe rash involving mucus membranes or skin necrosis: No Has patient had a PCN reaction that required hospitalization: No Has patient had a PCN reaction occurring within the last 10 years: Yes If all of the above answers are "NO", then may proceed with Cephalosporin use.   . Tape Rash and Other (See Comments)    Surgical tape    Patient Measurements: Height: 5' 8" (172.7 cm) Weight: 157 lb 10.1 oz (71.5 kg) IBW/kg (Calculated) : 68.4   Vital Signs: Temp: 97 F (36.1 C) (05/23 1700) Temp Source: Core (Comment) (05/23 1400) BP: 122/37 (05/23 1700)  Labs: Recent Labs    05/14/18 1310 05/15/18 0549  05/16/18 0040 05/16/18 0335 05/16/18 0806 05/16/18 1000 05/16/18 1711  HGB 11.1* 10.2*  --   --  11.0*  --   --   --   HCT 34.4* 31.5*  --   --  33.9*  --   --   --   PLT 213 172  --   --  127*  --   --   --   LABPROT 22.0* 22.4*  --   --   --  24.5*  --   --   INR 1.94 1.98  --   --   --  2.23  --   --   HEPARINUNFRC  --  <0.10*   < > 0.26*  --   --  0.52 0.64  CREATININE 1.39* 1.32*  --   --  1.24  --   --   --    < > = values in this interval not displayed.    Estimated Creatinine Clearance: 52.9 mL/min (by C-G formula based on SCr of 1.24 mg/dL).   Assessment: 26 yoM on warfarin for AFib and mechanical AVR presents with SOB. INR subtherapeutic on admit, started on IV heparin bridge. INR subtherapeutic at 2.23 this morning but trending up s/p several boosted doses. Heparin level at goal at 0.64.  PTA Dose = 2.10m/d except 519mSat  Goal of Therapy:  (INR goal  2.5-3.5 per PCP) Monitor platelets by anticoagulation protocol: Yes   Plan:  -Continue heparin 1600 units/hr -Daily INR, heparin level, CBC  JeNevada CranePhRoylene ReasonBCPhoenix Children'S Hospitallinical Pharmacist Pager ([phone]/23/2019 6:46 PM

## 2018-05-16 NOTE — Progress Notes (Addendum)
Advanced Heart Failure Rounding Note  PCP-Cardiologist: No primary care provider on file.   Subjective:    RHC 05/14/18 showed Mild/Moderate PAH in setting of high output with no evidence of intracardiac shunting. Hemodynamics as below.   Pressures remain soft overnight, as low as 35-39N systolic. Down another 5 lbs.   Feeling OK, but having significant leg cramping since propping his legs up. Able to get more comfortable.   TSH WNL. T4 normal. T3 pending. Ammonia 80. Thiamine pending.   [place] numbers PA 57/20 (32) CVP 10-11 CO 4.1 CI 2.1  RHC 05/14/18 RA = 18 RV = 71/17 PA = 69/24 (39) PCW = 20 Fick cardiac output/index = 7.0/3.7 Thermo CO/CI = 7.2/3.8 PVR = 2.6 WU Ao sat = 97% PA sat =  73%, 73% SVC sat = 73%  RA sat = 72%  Objective:   Weight Range: 157 lb 10.1 oz (71.5 kg) Body mass index is 23.97 kg/m.   Vital Signs:   Temp:  [97.2 F (36.2 C)-98.6 F (37 C)] 97.7 F (36.5 C) (05/23 0700) Pulse Rate:  [88] 88 (05/22 2224) Resp:  [13-26] 23 (05/23 0700) BP: (73-126)/(51-94) 102/75 (05/23 0700) SpO2:  [90 %-100 %] 94 % (05/23 0700) Weight:  [157 lb 10.1 oz (71.5 kg)] 157 lb 10.1 oz (71.5 kg) (05/23 0500) Last BM Date: 05/15/18  Weight change: Filed Weights   05/14/18 1145 05/15/18 0500 05/16/18 0500  Weight: 166 lb 0.1 oz (75.3 kg) 162 lb 11.2 oz (73.8 kg) 157 lb 10.1 oz (71.5 kg)    Intake/Output:   Intake/Output Summary (Last 24 hours) at 05/16/2018 0731 Last data filed at 05/16/2018 0700 Gross per 24 hour  Intake 1150.32 ml  Output 4750 ml  Net -3599.68 ml      Physical Exam    General: Fatigued appearing. NAD.  HEENT: Normal Neck: Supple. JVP elevated. Carotids 2+ bilat; no bruits. No thyromegaly or nodule noted. Cor: PMI nondisplaced. Irregularly irregular, 3/6 TR, +RV lift. Loud P2.  Lungs: Diminished throughout. No wheeze.  Abdomen: Soft, non-tender, non-distended, no HSM. No bruits or masses. +BS  Extremities: No clubbing or  rash. Cool to the touch with 2-3+ edema into thighs. Significant peripheral cyanosis.  Neuro: Alert & orientedx3, cranial nerves grossly intact. moves all 4 extremities w/o difficulty. Affect pleasant   Telemetry   Afib 70-80s, personally reviewed.   EKG    No new tracings.    Labs    CBC Recent Labs    05/14/18 1310 05/15/18 0549 05/16/18 0335  WBC 6.3 4.8 5.8  NEUTROABS 5.0  --   --   HGB 11.1* 10.2* 11.0*  HCT 34.4* 31.5* 33.9*  MCV 85.4 85.1 84.3  PLT 213 172 225*   Basic Metabolic Panel Recent Labs    05/14/18 1310 05/15/18 0549 05/16/18 0335  NA 133* 135 133*  K 3.2* 3.8 3.6  CL 100* 105 99*  CO2 _0 GLUCOSE 85 71 90  BUN 26* 24* 25*  CREATININE 1.39* 1.32* 1.24  CALCIUM 8.9 8.2* 8.4*  MG 1.9  --   --    Liver Function Tests Recent Labs    05/14/18 1310  AST 37  ALT 14*  ALKPHOS 78  BILITOT 4.8*  PROT 7.7  ALBUMIN 3.0*   No results for input(s): LIPASE, AMYLASE in the last 72 hours. Cardiac Enzymes No results for input(s): CKTOTAL, CKMB, CKMBINDEX, TROPONINI in the last 72 hours.  BNP: BNP (last 3 results) Recent Labs  01/09/18 1705 05/14/18 1310  BNP 668.0* 1,247.0*    ProBNP (last 3 results) No results for input(s): PROBNP in the last 8760 hours.   D-Dimer No results for input(s): DDIMER in the last 72 hours. Hemoglobin A1C No results for input(s): HGBA1C in the last 72 hours. Fasting Lipid Panel No results for input(s): CHOL, HDL, LDLCALC, TRIG, CHOLHDL, LDLDIRECT in the last 72 hours. Thyroid Function Tests Recent Labs    05/15/18 0901  TSH 2.768    Other results:   Imaging    No results found.   Medications:     Scheduled Medications: . aspirin  81 mg Oral BH-q7a  . furosemide  80 mg Intravenous BID  . loratadine  10 mg Oral Daily  . macitentan  10 mg Oral Daily  . metoprolol tartrate  25 mg Oral BID  . midodrine  5 mg Oral TID WC  . sodium chloride flush  3 mL Intravenous Q12H  . sodium chloride  flush  3 mL Intravenous Q12H  . spironolactone  12.5 mg Oral Daily  . tadalafil (PAH)  40 mg Oral Daily  . Warfarin - Pharmacist Dosing Inpatient   Does not apply q1800    Infusions: . sodium chloride 10 mL/hr at 05/16/18 0700  . sodium chloride 10 mL/hr at 05/16/18 0700  . 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 Also has COPD (quit smoking in 2002 - 1 ppd x 25 years), HL, HTN   Directly admitted 05/14/18 with concerns for low output CHF.   Assessment/Plan   1. Pulmonary hypertension with cor pulmonale - Previous RHC with mod/severe pulm HTN with RV failure.   - RHC 05/14/18 with Mild/Moderate PAH in setting of high output with no evidence of intracardiac shunting. - Volume status remains elevated. CVP 10-11 cm  - Continue lasix 80 mg BID - Continue adcirca 40 mg daily.  - Continue macitentan 10 mg daily - Hold Toprol with soft BPs. - Continue midodrine 5 mg TID - Serologies to date have been negative. Rheum appointment pending  - Bubble Study, High-Res CT, and RUQ [place] have been ordered.  - TSH WNL. T4 normal. T3 pending.  - Ammonia 80. Thiamine pending.    2. Chronic respiratory failure - Plan as above.    3. Chronic AFL - Rate controlled on lopressor.  - Continue coumadin with mechanical AVR.  - INR pending this am. 1.98 yesterday.   4. CAD - s/p CABG.  - No s/s of ischemia.    - Continue ASA. Will need statin   5. S/p mechanical AVR - Stable on most recent echo. Continue coumadin/ASA 81 - Aware of need for SBE prophylaxis - No change to current plan.     6.  Leukocytoclastic vasculitis - f/u with Rheumatology - No change to current plan.    Medication concerns reviewed with patient and pharmacy team. Barriers identified: None at this time.   Length of Stay: 2  Barnes, Florek  05/16/2018, 7:31 AM  Advanced Heart Failure Team Pager [phone] (M-F; 7a - 4p)  Please contact [facility] for night-coverage after hours (4p -7a )  and weekends on [email or web address]  Agree with above. He remains quite tenuous. Diuresing well but BP remains soft. Ammonia 80. PA pressures 65-70/30 CO 4.1 L/min. CVP 18.  Ab u/s c/w cirrhosis no evidence of portal HTN  Echo with bubble done personally - small amount of late bubbles c/w extracardiac shunting.   Exam Mildly cyanotic RIJ swan Cor IRR 2/6 TR mechanical s2 Lungs CTA Ab soft NT Ext 2+ edema mild cyanosis  Picture remains confusing. Cardiac output has now normalized (no longer high output). U/s reveals cirrhotic picture but unsure if this is primary or secondary to RHF. Does have some mild late bubbles on echo but history not overly convincing for Oslu-Weber-Rendu. Will continue diuresis. Discuss with Pulmonary. Pull swan in am. INR 2.2. Discussed dosing with PharmD personally.  CRITICAL CARE Performed by: Glori Bickers  Total critical care time: 35 minutes  Critical care time was exclusive of separately billable procedures and treating other patients.  Critical care was necessary to treat or prevent imminent or life-threatening deterioration.  Critical care was time spent personally by me (independent of midlevel providers or residents) on the following activities: development of treatment plan with patient and/or surrogate as well as nursing, discussions with consultants, evaluation of patient's response to treatment, examination of patient, obtaining history from patient or surrogate, ordering and performing treatments and interventions, ordering and review of laboratory studies, ordering  and review of radiographic studies, pulse oximetry and re-evaluation of patient's condition.  Glori Bickers, MD  3:57 PM

## 2018-05-16 NOTE — Progress Notes (Signed)
PCCM Interval Note  Very interesting case, PAP's are improved on targeted therapy compared with his prior R heart cath. High Res CT, hepatic doppler and bubble study are all pending. I agree that [place] / micro-shunting is a possibility here.   Await his studies, will follow with you.   Baltazar Apo, MD, PhD 05/16/2018, 9:53 AM [facility] [phone] or if no answer [phone]

## 2018-05-16 NOTE — Progress Notes (Signed)
ANTICOAGULATION CONSULT NOTE - Follow Up Consult  Pharmacy Consult for heparin Indication: Afib/AVR  Labs: Recent Labs    05/14/18 1310 05/15/18 0549 05/15/18 1500 05/16/18 0040  HGB 11.1* 10.2*  --   --   HCT 34.4* 31.5*  --   --   PLT 213 172  --   --   LABPROT 22.0* 22.4*  --   --   INR 1.94 1.98  --   --   HEPARINUNFRC  --  <0.10* 0.19* 0.26*  CREATININE 1.39* 1.32*  --   --     Assessment: 72yo male remains slightly subtherapeutic on heparin after rate increase; RN reports no gtt issues or signs of bleeding.  Goal of Therapy:  Heparin level 0.3-0.7 units/ml   Plan:  Will increase heparin gtt by ~1 units/kg/hr to 1600 units/hr and check level in 8 hours.    Wynona Neat, PharmD, BCPS  05/16/2018,2:08 AM

## 2018-05-16 NOTE — Care Management Note (Signed)
Case Management Note Marvetta Gibbons RN,BSN Unit [facility] 1-22 Case Manager  [phone]  Patient Details  Name: Lance Hicks MRN: [id number] Date of Birth: [birth year]  Subjective/Objective:   Pt admitted 05/14/18 with concerns for low output CHF.                Action/Plan: PTA pt lived at home, independent- CM to follow for transition of care needs  Expected Discharge Date:                  Expected Discharge Plan:     In-House Referral:     Discharge planning Services  CM Consult  Post Acute Care Choice:    Choice offered to:     DME Arranged:    DME Agency:     HH Arranged:    HH Agency:     Status of Service:  In process, will continue to follow  If discussed at Long Length of Stay Meetings, dates discussed:    Discharge Disposition:   Additional Comments:  Dawayne Patricia, RN 05/16/2018, 10:18 AM

## 2018-05-16 NOTE — Significant Event (Signed)
Patient returned safely to room at 1325pm from U/S abdomen and CT. Transported there with RNs. Stable VS during procedures and transport.    Lance Hicks

## 2018-05-16 NOTE — Progress Notes (Signed)
ANTICOAGULATION CONSULT NOTE - [place] for warfarin/Heparin Indication: atrial fibrillation, mechanical AVR  Allergies  Allergen Reactions  . Amoxicillin-Pot Clavulanate Other (See Comments)    Stomach pain Has patient had a PCN reaction causing immediate rash, facial/tongue/throat swelling, SOB or lightheadedness with hypotension: No Has patient had a PCN reaction causing severe rash involving mucus membranes or skin necrosis: No Has patient had a PCN reaction that required hospitalization: No Has patient had a PCN reaction occurring within the last 10 years: Yes If all of the above answers are "NO", then may proceed with Cephalosporin use.   . Tape Rash and Other (See Comments)    Surgical tape    Patient Measurements: Height: _0  (172.7 cm) Weight: 157 lb 10.1 oz (71.5 kg) IBW/kg (Calculated) : 68.4   Vital Signs: Temp: 97 F (36.1 C) (05/23 1000) Temp Source: Core (Comment) (05/23 1000) BP: 107/68 (05/23 1000)  Labs: Recent Labs    05/14/18 1310  05/15/18 0549 05/15/18 1500 05/16/18 0040 05/16/18 0335 05/16/18 0806 05/16/18 1000  HGB 11.1*  --  10.2*  --   --  11.0*  --   --   HCT 34.4*  --  31.5*  --   --  33.9*  --   --   PLT 213  --  172  --   --  127*  --   --   LABPROT 22.0*  --  22.4*  --   --   --  24.5*  --   INR 1.94  --  1.98  --   --   --  2.23  --   HEPARINUNFRC  --    < > <0.10* 0.19* 0.26*  --   --  0.52  CREATININE 1.39*  --  1.32*  --   --  1.24  --   --    < > = values in this interval not displayed.    Estimated Creatinine Clearance: 52.9 mL/min (by C-G formula based on SCr of 1.24 mg/dL).   Medical History: Past Medical History:  Diagnosis Date  . 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 In light of concurrent left nephrolithiasis, will check CT renal colic next year instead of renal US.   [name] Anticoagulated on Coumadin 01/04/2018  . Anxiety  05/10/2016   Last Assessment & Plan:  Doing well off of zoloft.  . Asymptomatic microscopic hematuria 06/20/2017   Last Assessment & Plan:  Had hematuria workup in [facility] in 2016 which negative CT and cystoscopy. UA with 2+ blood last visit - we discussed recommendation for repeat workup at 5 years or if degree of hematuria progresses.   . Atrial fibrillation ([place]) 03/29/2017  . Atrial flutter ([place]) 03/29/2017  . Chronic allergic rhinitis 04/28/2016   Last Assessment & Plan:  Continue astelin  . Chronic anticoagulation 03/29/2017  . Chronic atrial fibrillation ([place]) 07/23/2015   Last Assessment & Plan:  Coumadin and metoprolol, cardiology referral to establish care.  . Chronic midline back pain 12/14/2015   Last Assessment & Plan:  Pain management referral for further evaluation.  . Chronic prostatitis 07/23/2015   Last Assessment & Plan:  Has largely resolved since stopping bike riding. Recommend annual DRE AND PSA - will see back 12/2015 for annual screening, given 1st degree fhx. To call office for recurrent prostatitis symptoms.   [name] COPD (chronic obstructive pulmonary disease) ([place]) 06/12/2016  . Coronary artery disease involving  native coronary artery of native heart with angina pectoris ([place]) 03/29/2017  . Cough 10/17/2016   Last Assessment & Plan:  Discussed typical course for acute viral illness. If symptoms worsen or fail to improve by 7-10d, delayed ATBs, fluids, rest, NSAIDs/APAP prn. Seek care if not improving. Needs earlier INR check due to ATBs.  [name] Dyspnea 02/01/2016   Last Assessment & Plan:  Overall improving, eval by pulm, plan for CT, neg stress test with cardiology. Recent switch to carvedilol due to side effects.  . Epidermoid cyst of skin 08/24/2017  . Essential hypertension 12/14/2015   Last Assessment & Plan:  Hypertension control: controlled  Medications: compliant Medication Management: as noted in orders Home blood pressure monitoring recommended additionally as needed for symptoms  The  patient's care plan was reviewed and updated. Instructions and counseling were provided regarding patient goals and barriers. He was counseled to adopt a healthy lifestyle. Educational resources and self-management tools have been provided as charted in [facility] list.   . H/O maze procedure 03/29/2017  . [name] 03/29/2017   Overview:  2011  . Hx of CABG 03/29/2017  . Hyperlipidemia 03/29/2017  . Hypertensive heart disease 03/29/2017  . Kidney stones 07/23/2015   Overview:  x 3  Last Assessment & Plan:  By [place] has left nephrolithiasis, but not visible by KUB. Will check CT renal colic next year to assess both stone burden as well as to surveil AML.   . Leukocytoclastic vasculitis ([place]) 10/01/2017  . Localized edema 01/04/2018  . Lumbar radicular pain 01/19/2016  . Maculopapular rash 09/03/2017  . Nephrolithiasis 07/23/2015   Overview:  x 3  Last Assessment & Plan:  By [place] has left nephrolithiasis, but not visible by KUB. Will check CT renal colic next year to assess both stone burden as well as to surveil AML.   Overview:  x 3  Last Assessment & Plan:  Has 57m nonobstructing LUP stone - not visible by KUB.  Will check renal [place]/2019 - he will contact office sooner if symptomatic.   . Non-sustained ventricular tachycardia ([place] 03/29/2017  . Other hyperlipidemia 03/29/2017  . Palpitations 10/01/2017  . Paroxysmal atrial fibrillation ([place] 03/29/2017  . Paroxysmal atrial fibrillation ([place] 03/29/2017  . Pleural effusion, bilateral 08/09/2017  . Post-nasal drainage 02/25/2016   Last Assessment & Plan:  Trial zyrtec and flonase  . Prostate cancer screening 06/20/2017   Last Assessment & Plan:  Recommend continued annual CaP screening until within 10 years of life expectancy. Given good health and fhx of longevity, would anticipate CaP screening to continue until age 72  PSA today and again in one year on day of visit.  . Pulmonary hypertension ([place] 08/09/2017  . Pulmonary nodules 06/12/2016  . S/P  AVR (aortic valve replacement) 03/20/2016  . Supratherapeutic INR 07/26/2017  . Syncope 03/29/2017  . Syncope and collapse 02/01/2016  . Typical atrial flutter ([place] 02/01/2016   Assessment: 89yoM on warfarin for AFib and mechanical AVR presents with SOB. INR subtherapeutic on admit, started on IV heparin bridge. INR subtherapeutic at 2.23 this morning but trending up s/p several boosted doses. Heparin level at goal at 0.52.  PTA Dose = 2.579md except 61m4mat  Goal of Therapy:  (INR goal 2.5-3.5 per PCP) Monitor platelets by anticoagulation protocol: Yes   Plan:  -Warfarin 2.61mg661m tonight -Continue heparin 1600 units/hr -Confirmatory heparin level 8-hr -Daily INR, heparin level, CBC  MichArrie SenatearmD, BCPS PGY-2 Cardiology Pharmacy Resident Pager:  [phone] 05/16/2018

## 2018-05-17 ENCOUNTER — Inpatient Hospital Stay (HOSPITAL_COMMUNITY): Payer: Medicare Other

## 2018-05-17 DIAGNOSIS — I482 Chronic atrial fibrillation: Secondary | ICD-10-CM

## 2018-05-17 DIAGNOSIS — J849 Interstitial pulmonary disease, unspecified: Secondary | ICD-10-CM

## 2018-05-17 LAB — CBC
HCT: 34.5 % — ABNORMAL LOW (ref 39.0–52.0)
Hemoglobin: 11.2 g/dL — ABNORMAL LOW (ref 13.0–17.0)
MCH: 27.3 pg (ref 26.0–34.0)
MCHC: 32.5 g/dL (ref 30.0–36.0)
MCV: 83.9 fL (ref 78.0–100.0)
Platelets: 206 10*3/uL (ref 150–400)
RBC: 4.11 MIL/uL — ABNORMAL LOW (ref 4.22–5.81)
RDW: 17.5 % — ABNORMAL HIGH (ref 11.5–15.5)
WBC: 5.6 10*3/uL (ref 4.0–10.5)

## 2018-05-17 LAB — COOXEMETRY PANEL
Carboxyhemoglobin: 1.8 % — ABNORMAL HIGH (ref 0.5–1.5)
Carboxyhemoglobin: 1.9 % — ABNORMAL HIGH (ref 0.5–1.5)
Methemoglobin: 1.3 % (ref 0.0–1.5)
Methemoglobin: 1.3 % (ref 0.0–1.5)
O2 Saturation: 70.1 %
O2 Saturation: 72.5 %
Total hemoglobin: 12 g/dL (ref 12.0–16.0)
Total hemoglobin: 11.8 g/dL — ABNORMAL LOW (ref 12.0–16.0)

## 2018-05-17 LAB — BASIC METABOLIC PANEL
Anion gap: 11 (ref 5–15)
BUN: 24 mg/dL — ABNORMAL HIGH (ref 6–20)
CO2: 24 mmol/L (ref 22–32)
Calcium: 8.6 mg/dL — ABNORMAL LOW (ref 8.9–10.3)
Chloride: 99 mmol/L — ABNORMAL LOW (ref 101–111)
Creatinine, Ser: 1.4 mg/dL — ABNORMAL HIGH (ref 0.61–1.24)
GFR calc Af Amer: 57 mL/min — ABNORMAL LOW (ref >60)
GFR calc non Af Amer: 49 mL/min — ABNORMAL LOW (ref >60)
Glucose, Bld: 87 mg/dL (ref 65–99)
Potassium: 4.2 mmol/L (ref 3.5–5.1)
Sodium: 134 mmol/L — ABNORMAL LOW (ref 135–145)

## 2018-05-17 LAB — PROTIME-INR
INR: 2.42
Prothrombin Time: 26.1 seconds — ABNORMAL HIGH (ref 11.4–15.2)

## 2018-05-17 LAB — HEPARIN LEVEL (UNFRACTIONATED): Heparin Unfractionated: 0.46 IU/mL (ref 0.30–0.70)

## 2018-05-17 MED ORDER — TRAMADOL HCL 50 MG PO TABS
50.0000 mg | ORAL_TABLET | Freq: Four times a day (QID) | ORAL | Status: DC | PRN
  Administered 2018-05-17 – 2018-05-18 (×3): 100 mg via ORAL
  Administered 2018-05-18: 50 mg via ORAL
  Administered 2018-05-20 – 2018-05-21 (×3): 100 mg via ORAL
  Administered 2018-05-21: 50 mg via ORAL
  Filled 2018-05-17: qty 1
  Filled 2018-05-17 (×6): qty 2
  Filled 2018-05-17: qty 1

## 2018-05-17 MED ORDER — WARFARIN SODIUM 2.5 MG PO TABS
2.5000 mg | ORAL_TABLET | Freq: Once | ORAL | Status: AC
  Administered 2018-05-17: 2.5 mg via ORAL
  Filled 2018-05-17: qty 1

## 2018-05-17 MED ORDER — SODIUM CHLORIDE 0.9% FLUSH
10.0000 mL | INTRAVENOUS | Status: DC | PRN
  Administered 2018-05-17 (×2): 10 mL
  Filled 2018-05-17 (×2): qty 40

## 2018-05-17 MED ORDER — VANCOMYCIN HCL IN DEXTROSE 1-5 GM/200ML-% IV SOLN
1000.0000 mg | INTRAVENOUS | Status: DC
  Administered 2018-05-18 – 2018-05-19 (×2): 1000 mg via INTRAVENOUS
  Filled 2018-05-17 (×2): qty 200

## 2018-05-17 MED ORDER — SODIUM CHLORIDE 0.9% FLUSH
10.0000 mL | Freq: Two times a day (BID) | INTRAVENOUS | Status: DC
  Administered 2018-05-17 – 2018-05-18 (×3): 10 mL

## 2018-05-17 MED ORDER — CHLORHEXIDINE GLUCONATE CLOTH 2 % EX PADS
6.0000 | MEDICATED_PAD | Freq: Every day | CUTANEOUS | Status: DC
  Administered 2018-05-17 – 2018-05-18 (×2): 6 via TOPICAL

## 2018-05-17 MED ORDER — VANCOMYCIN HCL 10 G IV SOLR
1500.0000 mg | Freq: Once | INTRAVENOUS | Status: AC
  Administered 2018-05-17: 1500 mg via INTRAVENOUS
  Filled 2018-05-17: qty 1500

## 2018-05-17 MED ORDER — OXYCODONE-ACETAMINOPHEN 5-325 MG PO TABS
1.0000 | ORAL_TABLET | Freq: Four times a day (QID) | ORAL | Status: DC | PRN
  Administered 2018-05-17 – 2018-05-23 (×9): 1 via ORAL
  Filled 2018-05-17 (×10): qty 1

## 2018-05-17 NOTE — Progress Notes (Addendum)
ANTICOAGULATION CONSULT NOTE - [place] for warfarin/Heparin Indication: atrial fibrillation, mechanical AVR  Allergies  Allergen Reactions  . Amoxicillin-Pot Clavulanate Other (See Comments)    Stomach pain Has patient had a PCN reaction causing immediate rash, facial/tongue/throat swelling, SOB or lightheadedness with hypotension: No Has patient had a PCN reaction causing severe rash involving mucus membranes or skin necrosis: No Has patient had a PCN reaction that required hospitalization: No Has patient had a PCN reaction occurring within the last 10 years: Yes If all of the above answers are "NO", then may proceed with Cephalosporin use.   . Tape Rash and Other (See Comments)    Surgical tape    Patient Measurements: Height: _0  (172.7 cm) Weight: 150 lb 12.7 oz (68.4 kg) IBW/kg (Calculated) : 68.4   Vital Signs: Temp: 99.3 F (37.4 C) (05/24 0700) BP: 94/54 (05/24 0700)  Labs: Recent Labs    05/15/18 0549  05/16/18 0335 05/16/18 0806 05/16/18 1000 05/16/18 1711 05/17/18 0415 05/17/18 0535  HGB 10.2*  --  11.0*  --   --   --  11.2*  --   HCT 31.5*  --  33.9*  --   --   --  34.5*  --   PLT 172  --  127*  --   --   --  206  --   LABPROT 22.4*  --   --  24.5*  --   --  26.1*  --   INR 1.98  --   --  2.23  --   --  2.42  --   HEPARINUNFRC <0.10*   < >  --   --  0.52 0.64  --  0.46  CREATININE 1.32*  --  1.24  --   --   --  1.40*  --    < > = values in this interval not displayed.    Estimated Creatinine Clearance: 46.8 mL/min (A) (by C-G formula based on SCr of 1.4 mg/dL (H)).   Medical History: Past Medical History:  Diagnosis Date  . 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 In light of concurrent left nephrolithiasis, will check CT renal colic next year instead of renal US.   [name] Anticoagulated on Coumadin 01/04/2018  . Anxiety 05/10/2016   Last Assessment & Plan:   Doing well off of zoloft.  . Asymptomatic microscopic hematuria 06/20/2017   Last Assessment & Plan:  Had hematuria workup in [facility] in 2016 which negative CT and cystoscopy. UA with 2+ blood last visit - we discussed recommendation for repeat workup at 5 years or if degree of hematuria progresses.   . Atrial fibrillation ([place]) 03/29/2017  . Atrial flutter ([place]) 03/29/2017  . Chronic allergic rhinitis 04/28/2016   Last Assessment & Plan:  Continue astelin  . Chronic anticoagulation 03/29/2017  . Chronic atrial fibrillation ([place]) 07/23/2015   Last Assessment & Plan:  Coumadin and metoprolol, cardiology referral to establish care.  . Chronic midline back pain 12/14/2015   Last Assessment & Plan:  Pain management referral for further evaluation.  . Chronic prostatitis 07/23/2015   Last Assessment & Plan:  Has largely resolved since stopping bike riding. Recommend annual DRE AND PSA - will see back 12/2015 for annual screening, given 1st degree fhx. To call office for recurrent prostatitis symptoms.   [name] COPD (chronic obstructive pulmonary disease) ([place]) 06/12/2016  . Coronary artery disease involving native coronary artery of  native heart with angina pectoris ([place]) 03/29/2017  . Cough 10/17/2016   Last Assessment & Plan:  Discussed typical course for acute viral illness. If symptoms worsen or fail to improve by 7-10d, delayed ATBs, fluids, rest, NSAIDs/APAP prn. Seek care if not improving. Needs earlier INR check due to ATBs.  [name] Dyspnea 02/01/2016   Last Assessment & Plan:  Overall improving, eval by pulm, plan for CT, neg stress test with cardiology. Recent switch to carvedilol due to side effects.  . Epidermoid cyst of skin 08/24/2017  . Essential hypertension 12/14/2015   Last Assessment & Plan:  Hypertension control: controlled  Medications: compliant Medication Management: as noted in orders Home blood pressure monitoring recommended additionally as needed for symptoms  The patient's care plan was reviewed and  updated. Instructions and counseling were provided regarding patient goals and barriers. He was counseled to adopt a healthy lifestyle. Educational resources and self-management tools have been provided as charted in [facility] list.   . H/O maze procedure 03/29/2017  . [name] 03/29/2017   Overview:  2011  . Hx of CABG 03/29/2017  . Hyperlipidemia 03/29/2017  . Hypertensive heart disease 03/29/2017  . Kidney stones 07/23/2015   Overview:  x 3  Last Assessment & Plan:  By [place] has left nephrolithiasis, but not visible by KUB. Will check CT renal colic next year to assess both stone burden as well as to surveil AML.   . Leukocytoclastic vasculitis ([place]) 10/01/2017  . Localized edema 01/04/2018  . Lumbar radicular pain 01/19/2016  . Maculopapular rash 09/03/2017  . Nephrolithiasis 07/23/2015   Overview:  x 3  Last Assessment & Plan:  By [place] has left nephrolithiasis, but not visible by KUB. Will check CT renal colic next year to assess both stone burden as well as to surveil AML.   Overview:  x 3  Last Assessment & Plan:  Has 48m nonobstructing LUP stone - not visible by KUB.  Will check renal [place] - he will contact office sooner if symptomatic.   . Non-sustained ventricular tachycardia ([place] 03/29/2017  . Other hyperlipidemia 03/29/2017  . Palpitations 10/01/2017  . Paroxysmal atrial fibrillation ([place] 03/29/2017  . Paroxysmal atrial fibrillation ([place] 03/29/2017  . Pleural effusion, bilateral 08/09/2017  . Post-nasal drainage 02/25/2016   Last Assessment & Plan:  Trial zyrtec and flonase  . Prostate cancer screening 06/20/2017   Last Assessment & Plan:  Recommend continued annual CaP screening until within 10 years of life expectancy. Given good health and fhx of longevity, would anticipate CaP screening to continue until age 72  PSA today and again in one year on day of visit.  . Pulmonary hypertension ([place] 08/09/2017  . Pulmonary nodules 06/12/2016  . S/P AVR (aortic valve replacement)  03/20/2016  . Supratherapeutic INR 07/26/2017  . Syncope 03/29/2017  . Syncope and collapse 02/01/2016  . Typical atrial flutter ([place] 02/01/2016   Assessment: 89yoM on warfarin for AFib and mechanical AVR presents with SOB. INR subtherapeutic on admit, started on IV heparin bridge. INR remains subtherapeutic at 2.42 this morning but trending up s/p several boosted doses. Heparin level at goal at 0.46. Pltc now wnl, Hgb stable. Hopefully will stop heparin tomorrow am.  PTA Dose = 2.536md except 34m14mat  Goal of Therapy:  (INR goal 2.5-3.5 per PCP) Monitor platelets by anticoagulation protocol: Yes   Plan:  -Warfarin 2.34mg29m tonight -Continue heparin 1600 units/hr -Daily INR, heparin level, CBC  ADDENDUM: Per Md stop heparin,  cont warfarin, protime tomorrow morning.  Arrie Senate, PharmD, BCPS PGY-2 Cardiology Pharmacy Resident Pager: [phone] 05/17/2018

## 2018-05-17 NOTE — Consult Note (Addendum)
Reason for Consult:Right shoulder pain Referring Physician: D Bensimhon  Lance Hicks is an 72 y.o. male.  HPI: Lance Hicks was worming a horse last Thursday when it reared and pulled away.  He was lifted on the ground.  He felt a pulling sensation in the right shoulder.  He had immediate right shoulder pain. Since then it's been painful and weak and doesn't seem to be getting better. He then got admitted to the hospital with pulmonary HTN.  He has history of previous right shoulder surgery in the past.  Past Medical History:  Diagnosis Date  . 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 In light of concurrent left nephrolithiasis, will check CT renal colic next year instead of renal US.   [name] Anticoagulated on Coumadin 01/04/2018  . Anxiety 05/10/2016   Last Assessment & Plan:  Doing well off of zoloft.  . Asymptomatic microscopic hematuria 06/20/2017   Last Assessment & Plan:  Had hematuria workup in [facility] in 2016 which negative CT and cystoscopy. UA with 2+ blood last visit - we discussed recommendation for repeat workup at 5 years or if degree of hematuria progresses.   . Atrial fibrillation ([place]) 03/29/2017  . Atrial flutter ([place]) 03/29/2017  . Chronic allergic rhinitis 04/28/2016   Last Assessment & Plan:  Continue astelin  . Chronic anticoagulation 03/29/2017  . Chronic atrial fibrillation ([place]) 07/23/2015   Last Assessment & Plan:  Coumadin and metoprolol, cardiology referral to establish care.  . Chronic midline back pain 12/14/2015   Last Assessment & Plan:  Pain management referral for further evaluation.  . Chronic prostatitis 07/23/2015   Last Assessment & Plan:  Has largely resolved since stopping bike riding. Recommend annual DRE AND PSA - will see back 12/2015 for annual screening, given 1st degree fhx. To call office for recurrent prostatitis symptoms.   [name] COPD (chronic obstructive pulmonary disease) ([place]) 06/12/2016  . Coronary  artery disease involving native coronary artery of native heart with angina pectoris ([place]) 03/29/2017  . Cough 10/17/2016   Last Assessment & Plan:  Discussed typical course for acute viral illness. If symptoms worsen or fail to improve by 7-10d, delayed ATBs, fluids, rest, NSAIDs/APAP prn. Seek care if not improving. Needs earlier INR check due to ATBs.  [name] Dyspnea 02/01/2016   Last Assessment & Plan:  Overall improving, eval by pulm, plan for CT, neg stress test with cardiology. Recent switch to carvedilol due to side effects.  . Epidermoid cyst of skin 08/24/2017  . Essential hypertension 12/14/2015   Last Assessment & Plan:  Hypertension control: controlled  Medications: compliant Medication Management: as noted in orders Home blood pressure monitoring recommended additionally as needed for symptoms  The patient's care plan was reviewed and updated. Instructions and counseling were provided regarding patient goals and barriers. He was counseled to adopt a healthy lifestyle. Educational resources and self-management tools have been provided as charted in [facility] list.   . H/O maze procedure 03/29/2017  . [name] 03/29/2017   Overview:  2011  . Hx of CABG 03/29/2017  . Hyperlipidemia 03/29/2017  . Hypertensive heart disease 03/29/2017  . Kidney stones 07/23/2015   Overview:  x 3  Last Assessment & Plan:  By [place] has left nephrolithiasis, but not visible by KUB. Will check CT renal colic next year to assess both stone burden as well as to surveil AML.   . 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  Localized edema 01/04/2018  . Lumbar radicular pain 01/19/2016  . Maculopapular rash 09/03/2017  . Nephrolithiasis 07/23/2015   Overview:  x 3  Last Assessment & Plan:  By [place] has left nephrolithiasis, but not visible by KUB. Will check CT renal colic next year to assess both stone burden as well as to surveil AML.   Overview:  x 3  Last Assessment & Plan:  Has 61m nonobstructing LUP stone -  not visible by KUB.  Will check renal [place] - he will contact office sooner if symptomatic.   . Non-sustained ventricular tachycardia ([place] 03/29/2017  . Other hyperlipidemia 03/29/2017  . Palpitations 10/01/2017  . Paroxysmal atrial fibrillation ([place] 03/29/2017  . Paroxysmal atrial fibrillation ([place] 03/29/2017  . Pleural effusion, bilateral 08/09/2017  . Post-nasal drainage 02/25/2016   Last Assessment & Plan:  Trial zyrtec and flonase  . Prostate cancer screening 06/20/2017   Last Assessment & Plan:  Recommend continued annual CaP screening until within 10 years of life expectancy. Given good health and fhx of longevity, would anticipate CaP screening to continue until age 72  PSA today and again in one year on day of visit.  . Pulmonary hypertension ([place] 08/09/2017  . Pulmonary nodules 06/12/2016  . S/P AVR (aortic valve replacement) 03/20/2016  . Supratherapeutic INR 07/26/2017  . Syncope 03/29/2017  . Syncope and collapse 02/01/2016  . Typical atrial flutter ([place] 02/01/2016    Past Surgical History:  Procedure Laterality Date  . CHOLECYSTECTOMY    . CORONARY ARTERY BYPASS GRAFT    . EXPLORATORY LAPAROTOMY  07/30/2017  . FOOT SURGERY    . FRACTURE SURGERY Right    wrist and forearm  . HERNIA REPAIR    . MECHANICAL AORTIC VALVE REPLACEMENT    . NASAL SINUS SURGERY    . RIGHT HEART CATH N/A 01/18/2018   Procedure: RIGHT HEART CATH;  Surgeon: BJolaine Artist MD;  [facility];  Service: Cardiovascular;  Laterality: N/A;  . RIGHT HEART CATH N/A 05/14/2018   Procedure: RIGHT HEART CATH;  Surgeon: BJolaine Artist MD;  [facility];  Service: Cardiovascular;  Laterality: N/A;  . VASECTOMY      Family History  Problem Relation Age of Onset  . Asthma Mother   . Arthritis Mother   . Heart attack Father   . Hypertension Father   . Stroke Paternal Grandmother     Social History:  reports that he quit smoking about 17 years ago. His smoking use included  cigarettes. He has a 68.00 pack-year smoking history. He has never used smokeless tobacco. He reports that he drinks alcohol. He reports that he does not use drugs.  Allergies:  Allergies  Allergen Reactions  . Amoxicillin-Pot Clavulanate Other (See Comments)    Stomach pain Has patient had a PCN reaction causing immediate rash, facial/tongue/throat swelling, SOB or lightheadedness with hypotension: No Has patient had a PCN reaction causing severe rash involving mucus membranes or skin necrosis: No Has patient had a PCN reaction that required hospitalization: No Has patient had a PCN reaction occurring within the last 10 years: Yes If all of the above answers are "NO", then may proceed with Cephalosporin use.   . Tape Rash and Other (See Comments)    Surgical tape    Medications: I have reviewed the patient's current medications.  Results for orders placed or performed during the hospital encounter of 05/14/18 (from the past 48 hour(s))  Heparin level (unfractionated)  Status: Abnormal   Collection Time: 05/15/18  3:00 PM  Result Value Ref Range   Heparin Unfractionated 0.19 (L) 0.30 - 0.70 IU/mL    Comment: (NOTE) If heparin results are below expected values, and patient dosage has  been confirmed, suggest follow up testing of antithrombin III levels. Performed at [facility], [place] [address] [place], [place] [address]   Heparin level (unfractionated)     Status: Abnormal   Collection Time: 05/16/18 12:40 AM  Result Value Ref Range   Heparin Unfractionated 0.26 (L) 0.30 - 0.70 IU/mL    Comment: (NOTE) If heparin results are below expected values, and patient dosage has  been confirmed, suggest follow up testing of antithrombin III levels. Performed at [facility], [place] [address] [place], [place] [address]   Cooxemetry Panel (carboxy, met, total hgb, O2 sat)     Status: Abnormal   Collection Time: 05/16/18  3:30 AM  Result Value Ref Range   Total  hemoglobin 11.1 (L) 12.0 - 16.0 g/dL   O2 Saturation 58.7 %   Carboxyhemoglobin 1.9 (H) 0.5 - 1.5 %   Methemoglobin 1.6 (H) 0.0 - 1.5 %  Basic metabolic panel     Status: Abnormal   Collection Time: 05/16/18  3:35 AM  Result Value Ref Range   Sodium 133 (L) 135 - 145 mmol/L   Potassium 3.6 3.5 - 5.1 mmol/L   Chloride 99 (L) 101 - 111 mmol/L   CO2 25 22 - 32 mmol/L   Glucose, Bld 90 65 - 99 mg/dL   BUN 25 (H) 6 - 20 mg/dL   Creatinine, Ser 1.24 0.61 - 1.24 mg/dL   Calcium 8.4 (L) 8.9 - 10.3 mg/dL   GFR calc non Af Amer 57 (L) >60 mL/min   GFR calc Af Amer >60 >60 mL/min    Comment: (NOTE) The eGFR has been calculated using the CKD EPI equation. This calculation has not been validated in all clinical situations. eGFR's persistently <60 mL/min signify possible Chronic Kidney Disease.    Anion gap 9 5 - 15    Comment: Performed at [facility] [address], [place], [place] [address]  CBC     Status: Abnormal   Collection Time: 05/16/18  3:35 AM  Result Value Ref Range   WBC 5.8 4.0 - 10.5 K/uL   RBC 4.02 (L) 4.22 - 5.81 MIL/uL   Hemoglobin 11.0 (L) 13.0 - 17.0 g/dL   HCT 33.9 (L) 39.0 - 52.0 %   MCV 84.3 78.0 - 100.0 fL   MCH 27.4 26.0 - 34.0 pg   MCHC 32.4 30.0 - 36.0 g/dL   RDW 17.3 (H) 11.5 - 15.5 %   Platelets 127 (L) 150 - 400 K/uL    Comment: Performed at [facility], [place] [address] [place], [place] [address]  Protime-INR     Status: Abnormal   Collection Time: 05/16/18  8:06 AM  Result Value Ref Range   Prothrombin Time 24.5 (H) 11.4 - 15.2 seconds   INR 2.23     Comment: Performed at [facility] [address] [place], [place] [address]  Magnesium     Status: None   Collection Time: 05/16/18  8:06 AM  Result Value Ref Range   Magnesium 1.8 1.7 - 2.4 mg/dL    Comment: Performed at [facility] [address], [place], [place] [address]  Surgical pcr screen     Status: None  Collection Time: 05/16/18  9:32 AM  Result Value Ref  Range   MRSA, PCR NEGATIVE NEGATIVE   Staphylococcus aureus NEGATIVE NEGATIVE    Comment: (NOTE) The Xpert SA Assay (FDA approved for NASAL specimens in patients 75 years of age and older), is one component of a comprehensive surveillance program. It is not intended to diagnose infection nor to guide or monitor treatment. Performed at [facility], [place] [address], [place], [place] [address]   Heparin level (unfractionated)     Status: None   Collection Time: 05/16/18 10:00 AM  Result Value Ref Range   Heparin Unfractionated 0.52 0.30 - 0.70 IU/mL    Comment: (NOTE) If heparin results are below expected values, and patient dosage has  been confirmed, suggest follow up testing of antithrombin III levels. Performed at [facility], [place] [address], [place], [place] [address]   Heparin level (unfractionated)     Status: None   Collection Time: 05/16/18  5:11 PM  Result Value Ref Range   Heparin Unfractionated 0.64 0.30 - 0.70 IU/mL    Comment: (NOTE) If heparin results are below expected values, and patient dosage has  been confirmed, suggest follow up testing of antithrombin III levels. Performed at [facility], [place] [address], [place], [place] [address]   Basic metabolic panel     Status: Abnormal   Collection Time: 05/17/18  4:15 AM  Result Value Ref Range   Sodium 134 (L) 135 - 145 mmol/L   Potassium 4.2 3.5 - 5.1 mmol/L   Chloride 99 (L) 101 - 111 mmol/L   CO2 24 22 - 32 mmol/L   Glucose, Bld 87 65 - 99 mg/dL   BUN 24 (H) 6 - 20 mg/dL   Creatinine, Ser 1.40 (H) 0.61 - 1.24 mg/dL   Calcium 8.6 (L) 8.9 - 10.3 mg/dL   GFR calc non Af Amer 49 (L) >60 mL/min   GFR calc Af Amer 57 (L) >60 mL/min    Comment: (NOTE) The eGFR has been calculated using the CKD EPI equation. This calculation has not been validated in all clinical situations. eGFR's persistently <60 mL/min signify possible Chronic Kidney Disease.    Anion gap 11 5 - 15    Comment: Performed  at [facility] [address] [place], [place] [address]  CBC     Status: Abnormal   Collection Time: 05/17/18  4:15 AM  Result Value Ref Range   WBC 5.6 4.0 - 10.5 K/uL   RBC 4.11 (L) 4.22 - 5.81 MIL/uL   Hemoglobin 11.2 (L) 13.0 - 17.0 g/dL   HCT 34.5 (L) 39.0 - 52.0 %   MCV 83.9 78.0 - 100.0 fL   MCH 27.3 26.0 - 34.0 pg   MCHC 32.5 30.0 - 36.0 g/dL   RDW 17.5 (H) 11.5 - 15.5 %   Platelets 206 150 - 400 K/uL    Comment: Performed at [facility], [place] [address], [place], [place] [address]  Protime-INR     Status: Abnormal   Collection Time: 05/17/18  4:15 AM  Result Value Ref Range   Prothrombin Time 26.1 (H) 11.4 - 15.2 seconds   INR 2.42     Comment: Performed at [facility] [address], [place],  [address]  Cooxemetry Panel (carboxy, met, total hgb, O2 sat)     Status: Abnormal   Collection Time: 05/17/18  4:21 AM  Result Value Ref Range   Total  hemoglobin 12.0 12.0 - 16.0 g/dL   O2 Saturation 70.1 %   Carboxyhemoglobin 1.8 (H) 0.5 - 1.5 %   Methemoglobin 1.3 0.0 - 1.5 %  Heparin level (unfractionated)     Status: None   Collection Time: 05/17/18  5:35 AM  Result Value Ref Range   Heparin Unfractionated 0.46 0.30 - 0.70 IU/mL    Comment: (NOTE) If heparin results are below expected values, and patient dosage has  been confirmed, suggest follow up testing of antithrombin III levels. Performed at [facility], [place] [address], [place], [place] [address]   .Cooxemetry Panel (carboxy, met, total hgb, O2 sat)     Status: Abnormal   Collection Time: 05/17/18  6:41 AM  Result Value Ref Range   Total hemoglobin 11.8 (L) 12.0 - 16.0 g/dL   O2 Saturation 72.5 %   Carboxyhemoglobin 1.9 (H) 0.5 - 1.5 %   Methemoglobin 1.3 0.0 - 1.5 %    Ct Chest High Resolution  Result Date: 05/16/2018 CLINICAL DATA:  Inpatient. Cough. Evaluate for interstitial lung disease. EXAM: CT CHEST WITHOUT CONTRAST TECHNIQUE: Multidetector CT imaging of the chest  was performed following the standard protocol without intravenous contrast. High resolution imaging of the lungs, as well as inspiratory and expiratory imaging, was performed. COMPARISON:  05/14/2018 chest radiograph. FINDINGS: Cardiovascular: Mild cardiomegaly. No significant pericardial effusion/thickening. Aortic valvular prosthesis is in place. Right internal jugular Swan-Ganz catheter terminates in the distal right pulmonary artery. Left main and 3 vessel coronary atherosclerosis. Retained epicardial leads are noted anteriorly. Atherosclerotic thoracic aorta with ectatic 4.3 cm ascending thoracic aorta. Top-normal caliber main pulmonary artery (3.0 cm diameter). Mediastinum/Nodes: No discrete thyroid nodules. Unremarkable esophagus. No axillary adenopathy. Mild right paratracheal adenopathy up to 1.2 cm (series 5/image 46). Mildly enlarged 1.3 cm subcarinal node (series 5/image 74). Coarsely calcified right hilar nodes from prior granulomatous disease. Otherwise no discretely enlarged noncalcified hilar nodes on this noncontrast scan. Lungs/Pleura: No pneumothorax. Trace dependent bilateral pleural effusions, right greater than left. Mild-to-moderate centrilobular emphysema with mild diffuse bronchial wall thickening. Scattered calcified right lower lobe granulomas. No significant lobular air trapping on the expiration sequence. Solid peripheral right middle lobe 0.9 cm pulmonary nodule (series 7/image 101) with slightly irregular contour. No acute consolidative airspace disease, lung masses or additional significant pulmonary nodules. There is mild patchy interlobular septal thickening in both lungs. There is basilar predominant patchy confluent subpleural reticulation and ground-glass attenuation in both lungs with the suggestion of associated minimal traction bronchiolectasis. No frank honeycombing. Upper abdomen: No acute abnormality. Small to moderate volume perihepatic ascites. Musculoskeletal: No  aggressive appearing focal osseous lesions. Intact sternotomy wires. Moderate thoracic spondylosis. IMPRESSION: 1. Slightly irregular 0.9 cm peripheral right middle lobe solid pulmonary nodule. Consider one of the following in 3 months for both low-risk and high-risk individuals: (a) repeat chest CT, (b) follow-up PET-CT, or (c) tissue sampling. This recommendation follows the consensus statement: Guidelines for Management of Incidental Pulmonary Nodules Detected on CT Images: From the Fleischner Society 2017; Radiology 2017; 284:228-243. 2. Dependent basilar predominant patchy subpleural reticulation and ground-glass attenuation with the suggestion of minimal associated traction bronchiolectasis. No frank honeycombing. Findings may indicate an interstitial lung disease such as early usual interstitial pneumonia (UIP) or fibrotic phase nonspecific interstitial pneumonia (NSIP). Suggest a follow-up high-resolution chest CT study in 6-12 months to assess temporal pattern stability, as clinically warranted. 3. Mild cardiomegaly. Minimal interlobular septal thickening and trace dependent bilateral pleural effusions. These findings suggest a mild component of  congestive heart failure. 4. Small to moderate volume perihepatic ascites. 5. Ectatic 4.3 cm ascending thoracic aorta. Recommend annual imaging followup by CTA or MRA. This recommendation follows 2010 ACCF/AHA/AATS/ACR/ASA/SCA/SCAI/SIR/STS/SVM Guidelines for the Diagnosis and Management of Patients with Thoracic Aortic Disease. Circulation. 2010; 121: X521-V471. 6. Left main and 3 vessel coronary atherosclerosis. Aortic Atherosclerosis (ICD10-I70.0) and Emphysema (ICD10-J43.9). Electronically Signed   By: Ilona Sorrel M.D.   On: 05/16/2018 16:11   US Liver Doppler  Result Date: 05/16/2018 CLINICAL DATA:  Hepatic cirrhosis. EXAM: DUPLEX ULTRASOUND OF LIVER TECHNIQUE: Color and duplex Doppler ultrasound was performed to evaluate the hepatic in-flow and out-flow  vessels. COMPARISON:  None. FINDINGS: Portal Vein Velocities Main:  26 cm/sec Right:  14 cm/sec Left:  18 cm/sec Normal hepatopetal flow is noted in the portal veins. Hepatic Vein Velocities Right:  40 cm/sec Middle:  30 cm/sec Left:  32 cm/sec Normal hepatofugal flow is noted in the hepatic veins. Hepatic Artery Velocity:  65 cm/sec Splenic Vein Velocity:  33.4 cm/sec Varices: Absent. Ascites: Present. Superior mesenteric vein velocity: 30 centimeters/second IMPRESSION: No Doppler evidence of hepatic, splenic or portal venous thrombosis or occlusion. Mild ascites is noted. Electronically Signed   By: Marijo Conception, M.D.   On: 05/16/2018 13:47   US Abdomen Limited Ruq  Result Date: 05/16/2018 CLINICAL DATA:  Hepatic cirrhosis. EXAM: ULTRASOUND ABDOMEN LIMITED RIGHT UPPER QUADRANT COMPARISON:  None. FINDINGS: Gallbladder: Status post cholecystectomy. Common bile duct: Diameter: 3.5 mm which is within normal limits. Liver: No focal lesion identified. Mildly heterogeneous echotexture of hepatic parenchyma is noted with nodular hepatic contours suggesting cirrhosis. Mild ascites is noted. Portal vein is patent on color Doppler imaging with normal direction of blood flow towards the liver. IMPRESSION: Status post cholecystectomy. Findings consistent with hepatic cirrhosis. Mild ascites is noted around the liver. No focal abnormality is noted. Electronically Signed   By: Marijo Conception, M.D.   On: 05/16/2018 13:32    Review of Systems  Constitutional: Negative for weight loss.  HENT: Negative for ear discharge, ear pain, hearing loss and tinnitus.   Eyes: Negative for blurred vision, double vision, photophobia and pain.  Respiratory: Negative for cough, sputum production and shortness of breath.   Cardiovascular: Negative for chest pain.  Gastrointestinal: Negative for abdominal pain, nausea and vomiting.  Genitourinary: Negative for dysuria, flank pain, frequency and urgency.  Musculoskeletal: Positive  for joint pain (Right shoulder/upper arm). Negative for back pain, falls, myalgias and neck pain.  Neurological: Negative for dizziness, tingling, sensory change, focal weakness, loss of consciousness and headaches.  Endo/Heme/Allergies: Does not bruise/bleed easily.  Psychiatric/Behavioral: Negative for depression, memory loss and substance abuse. The patient is not nervous/anxious.    Blood pressure (!) 94/54, pulse 88, temperature 99.3 F (37.4 C), resp. rate (!) 22, height _0  (1.727 m), weight 68.4 kg (150 lb 12.7 oz), SpO2 99 %. Physical Exam  Constitutional: He appears well-developed and well-nourished. No distress.  HENT:  Head: Normocephalic and atraumatic.  Eyes: Conjunctivae are normal. Right eye exhibits no discharge. Left eye exhibits no discharge. No scleral icterus.  Neck: Normal range of motion.  Cardiovascular: Normal rate and regular rhythm.  Respiratory: Effort normal. No respiratory distress.  Musculoskeletal:  Right shoulder, elbow, wrist, digits- no skin wounds, severe TTP shoulder, esp anterior joint line, pain/weakness with all movement, esp , no instability, no blocks to motion  Sens  Ax/R/M/U intact  Mot   Ax/ R/ PIN/ M/ AIN/ U intact  Rad 2+  Neurological: He is alert.  Skin: Skin is warm and dry. He is not diaphoretic.  Psychiatric: He has a normal mood and affect. His behavior is normal.    Assessment/Plan: Right shoulder pain -- X-rays negative for bony injury. Suspect rotator cuff tear, will get MRI. Will order sling for comfort. Initial treatment likely conservative with PT. He should f/u with Dr. Victorino December as an OP.    Lisette Abu, PA-C Orthopedic Surgery [phone] 05/17/2018, 9:57 AM    ADDNEDUM: Patient seen and evaluated in the heart unit.  Examination of the right shoulder reveals no swelling.  No erythema.  He has significant pain with resisted elbow flexion and supination.  He has painful active range of motion of the shoulder  with diffuse rotator cuff weakness.  There is no pain with passive range of motion of the shoulder.  He is neurovascularly intact distally.  MRI is ordered to rule out rotator cuff/biceps pathology.  Recommend follow-up outpatient with Dr. Stann Mainland after discharge from the hospital.  There is no plan for surgery while in house.

## 2018-05-17 NOTE — Progress Notes (Signed)
Swan Numbers [id number] CVP 18 PA 74/29 (43) PCWP 32 Cardiac Output (Thermo) 4.01 Cardiac Index (Thermo)  2.12  Not to have new fever this afternoon. Tmax 100.8.    Will draw BCx NOW and start Vancomcyin to cover.   Pulling swan.   Discussed all above with Dr. Haroldine Laws.  Legrand Como [address]" [place], PA-C 05/17/2018 4:44 PM

## 2018-05-17 NOTE — Plan of Care (Signed)
  Problem: Education: Goal: Ability to demonstrate management of disease process will improve Outcome: Progressing Goal: Ability to verbalize understanding of medication therapies will improve Outcome: Progressing   Problem: Activity: Goal: Capacity to carry out activities will improve Outcome: Progressing   Problem: Cardiac: Goal: Ability to achieve and maintain adequate cardiopulmonary perfusion will improve Outcome: Progressing

## 2018-05-17 NOTE — Progress Notes (Addendum)
Advanced Heart Failure Rounding Note  PCP-Cardiologist: No primary care provider on file.   Subjective:    RHC 05/14/18 showed Mild/Moderate PAH in setting of high output with no evidence of intracardiac shunting. Hemodynamics as below.   High-Res Chest CT 05/16/18 1. Slightly irregular 0.9 cm peripheral right middle lobe solid pulmonary nodule. 3 month follow up recommended.  2. Dependent basilar predominant patchy subpleural reticulation and ground-glass attenuation with the suggestion of minimal associated traction bronchiolectasis. No frank honeycombing. Findings may indicate an interstitial lung disease such as early usual interstitial pneumonia (UIP) or fibrotic phase nonspecific interstitial pneumonia (NSIP). Suggest a follow-up high-resolution chest CT study in 6-12 months to assess temporal pattern stability, as clinically warranted. 3. Mild cardiomegaly. Minimal interlobular septal thickening and trace dependent bilateral pleural effusions.  4. Small to moderate volume perihepatic ascites. 5. Ectatic 4.3 cm ascending thoracic aorta. Recommend annual imaging followup by CTA or MRA.  6. Left main and 3 vessel coronary atherosclerosis.  US Abdomen RUQ 05/16/18 - s/p cholecystectomy. + hepatic cirrhosis. Mild ascites.   Liver Doppler 05/16/18 - No hepatic, splenic, or portal venous thrombosis or occlusion. Mild ascites.  Echo with bubble study 05/16/18 LVEF 55-60% with "late bubbles" , Mechanical AVR stable with trivial perivalvular regurg, Mild MR, Severe LAE, Severe RV dilation and reduced function. No PFO. Mod TR, PA peak pressure 68 mm Hg  TSH Normal. T4 normal. T3 slightly low at 1.7. Ammonia elevated at 80. Thiamine pending.  Feeling good this am. Down another 7 lbs. CVP 10. Remains sure in R shoulder from being pulled by his horse.   Creatinine 1.40. K 4.2. WBC 5.6. Hgb 11.2   Swan numbers PA 60-19 (32) CVP ~10 CO 5.4 CI 2.9  RHC 05/14/18 RA = 18 RV =  71/17 PA = 69/24 (39) PCW = 20 Fick cardiac output/index = 7.0/3.7 Thermo CO/CI = 7.2/3.8 PVR = 2.6 WU Ao sat = 97% PA sat =  73%, 73% SVC sat = 73%  RA sat = 72%  Objective:   Weight Range: 150 lb 12.7 oz (68.4 kg) Body mass index is 22.93 kg/m.   Vital Signs:   Temp:  [96.6 F (35.9 C)-99.7 F (37.6 C)] 99.3 F (37.4 C) (05/24 0700) Resp:  [14-25] 22 (05/24 0700) BP: (85-122)/(37-78) 94/54 (05/24 0700) SpO2:  [93 %-100 %] 99 % (05/24 0700) Weight:  [150 lb 12.7 oz (68.4 kg)] 150 lb 12.7 oz (68.4 kg) (05/24 0500) Last BM Date: 05/15/18  Weight change: Filed Weights   05/15/18 0500 05/16/18 0500 05/17/18 0500  Weight: 162 lb 11.2 oz (73.8 kg) 157 lb 10.1 oz (71.5 kg) 150 lb 12.7 oz (68.4 kg)    Intake/Output:   Intake/Output Summary (Last 24 hours) at 05/17/2018 0758 Last data filed at 05/17/2018 0600 Gross per 24 hour  Intake 1178 ml  Output 4375 ml  Net -3197 ml      Physical Exam    General:  Fatigued appearing.  HEENT: Perioral cyanosis otherwise normal Neck: Supple. JVP elevated. Carotids 2+ bilat; no bruits. No lymphadenopathy or thyromegaly appreciated. Cor: PMI nondisplaced. Irregularly irregular. 3/6 TR. +RV lift. Loud P2.  Lungs: Diminished throughout. No wheeze Abdomen: Soft, nontender, nondistended. No hepatosplenomegaly. No bruits or masses. Good bowel sounds. Extremities: No clubbing, or rash. Cool to the touch. 1-2+ edema.  Neuro: Alert & oriented x 3, cranial nerves grossly intact. moves all 4 extremities w/o difficulty. Affect pleasant.  Telemetry   Afib 70-80s, personally reviewed  EKG  No new tracings.    Labs    CBC Recent Labs    05/14/18 1310  05/16/18 0335 05/17/18 0415  WBC 6.3   < > 5.8 5.6  NEUTROABS 5.0  --   --   --   HGB 11.1*   < > 11.0* 11.2*  HCT 34.4*   < > 33.9* 34.5*  MCV 85.4   < > 84.3 83.9  PLT 213   < > 127* 206   < > = values in this interval not displayed.   Basic Metabolic Panel Recent Labs     05/14/18 1310  05/16/18 0335 05/16/18 0806 05/17/18 0415  NA 133*   < > 133*  --  134*  K 3.2*   < > 3.6  --  4.2  CL 100*   < > 99*  --  99*  CO2 23   < > 25  --  24  GLUCOSE 85   < > 90  --  87  BUN 26*   < > 25*  --  24*  CREATININE 1.39*   < > 1.24  --  1.40*  CALCIUM 8.9   < > 8.4*  --  8.6*  MG 1.9  --   --  1.8  --    < > = values in this interval not displayed.   Liver Function Tests Recent Labs    05/14/18 1310  AST 37  ALT 14*  ALKPHOS 78  BILITOT 4.8*  PROT 7.7  ALBUMIN 3.0*   No results for input(s): LIPASE, AMYLASE in the last 72 hours. Cardiac Enzymes No results for input(s): CKTOTAL, CKMB, CKMBINDEX, TROPONINI in the last 72 hours.  BNP: BNP (last 3 results) Recent Labs    01/09/18 1705 05/14/18 1310  BNP 668.0* 1,247.0*    ProBNP (last 3 results) No results for input(s): PROBNP in the last 8760 hours.   D-Dimer No results for input(s): DDIMER in the last 72 hours. Hemoglobin A1C No results for input(s): HGBA1C in the last 72 hours. Fasting Lipid Panel No results for input(s): CHOL, HDL, LDLCALC, TRIG, CHOLHDL, LDLDIRECT in the last 72 hours. Thyroid Function Tests Recent Labs    05/15/18 0901  TSH 2.768  T3FREE 1.7*    Other results:   Imaging    Ct Chest High Resolution  Result Date: 05/16/2018 CLINICAL DATA:  Inpatient. Cough. Evaluate for interstitial lung disease. EXAM: CT CHEST WITHOUT CONTRAST TECHNIQUE: Multidetector CT imaging of the chest was performed following the standard protocol without intravenous contrast. High resolution imaging of the lungs, as well as inspiratory and expiratory imaging, was performed. COMPARISON:  05/14/2018 chest radiograph. FINDINGS: Cardiovascular: Mild cardiomegaly. No significant pericardial effusion/thickening. Aortic valvular prosthesis is in place. Right internal jugular Swan-Ganz catheter terminates in the distal right pulmonary artery. Left main and 3 vessel coronary atherosclerosis.  Retained epicardial leads are noted anteriorly. Atherosclerotic thoracic aorta with ectatic 4.3 cm ascending thoracic aorta. Top-normal caliber main pulmonary artery (3.0 cm diameter). Mediastinum/Nodes: No discrete thyroid nodules. Unremarkable esophagus. No axillary adenopathy. Mild right paratracheal adenopathy up to 1.2 cm (series 5/image 46). Mildly enlarged 1.3 cm subcarinal node (series 5/image 74). Coarsely calcified right hilar nodes from prior granulomatous disease. Otherwise no discretely enlarged noncalcified hilar nodes on this noncontrast scan. Lungs/Pleura: No pneumothorax. Trace dependent bilateral pleural effusions, right greater than left. Mild-to-moderate centrilobular emphysema with mild diffuse bronchial wall thickening. Scattered calcified right lower lobe granulomas. No significant lobular air trapping on the expiration sequence. Solid peripheral  right middle lobe 0.9 cm pulmonary nodule (series 7/image 101) with slightly irregular contour. No acute consolidative airspace disease, lung masses or additional significant pulmonary nodules. There is mild patchy interlobular septal thickening in both lungs. There is basilar predominant patchy confluent subpleural reticulation and ground-glass attenuation in both lungs with the suggestion of associated minimal traction bronchiolectasis. No frank honeycombing. Upper abdomen: No acute abnormality. Small to moderate volume perihepatic ascites. Musculoskeletal: No aggressive appearing focal osseous lesions. Intact sternotomy wires. Moderate thoracic spondylosis. IMPRESSION: 1. Slightly irregular 0.9 cm peripheral right middle lobe solid pulmonary nodule. Consider one of the following in 3 months for both low-risk and high-risk individuals: (a) repeat chest CT, (b) follow-up PET-CT, or (c) tissue sampling. This recommendation follows the consensus statement: Guidelines for Management of Incidental Pulmonary Nodules Detected on CT Images: From the  Fleischner Society 2017; Radiology 2017; 284:228-243. 2. Dependent basilar predominant patchy subpleural reticulation and ground-glass attenuation with the suggestion of minimal associated traction bronchiolectasis. No frank honeycombing. Findings may indicate an interstitial lung disease such as early usual interstitial pneumonia (UIP) or fibrotic phase nonspecific interstitial pneumonia (NSIP). Suggest a follow-up high-resolution chest CT study in 6-12 months to assess temporal pattern stability, as clinically warranted. 3. Mild cardiomegaly. Minimal interlobular septal thickening and trace dependent bilateral pleural effusions. These findings suggest a mild component of congestive heart failure. 4. Small to moderate volume perihepatic ascites. 5. Ectatic 4.3 cm ascending thoracic aorta. Recommend annual imaging followup by CTA or MRA. This recommendation follows 2010 ACCF/AHA/AATS/ACR/ASA/SCA/SCAI/SIR/STS/SVM Guidelines for the Diagnosis and Management of Patients with Thoracic Aortic Disease. Circulation. 2010; 121: X646-O032. 6. Left main and 3 vessel coronary atherosclerosis. Aortic Atherosclerosis (ICD10-I70.0) and Emphysema (ICD10-J43.9). Electronically Signed   By: Ilona Sorrel M.D.   On: 05/16/2018 16:11   US Liver Doppler  Result Date: 05/16/2018 CLINICAL DATA:  Hepatic cirrhosis. EXAM: DUPLEX ULTRASOUND OF LIVER TECHNIQUE: Color and duplex Doppler ultrasound was performed to evaluate the hepatic in-flow and out-flow vessels. COMPARISON:  None. FINDINGS: Portal Vein Velocities Main:  26 cm/sec Right:  14 cm/sec Left:  18 cm/sec Normal hepatopetal flow is noted in the portal veins. Hepatic Vein Velocities Right:  40 cm/sec Middle:  30 cm/sec Left:  32 cm/sec Normal hepatofugal flow is noted in the hepatic veins. Hepatic Artery Velocity:  65 cm/sec Splenic Vein Velocity:  33.4 cm/sec Varices: Absent. Ascites: Present. Superior mesenteric vein velocity: 30 centimeters/second IMPRESSION: No Doppler  evidence of hepatic, splenic or portal venous thrombosis or occlusion. Mild ascites is noted. Electronically Signed   By: Marijo Conception, M.D.   On: 05/16/2018 13:47   US Abdomen Limited Ruq  Result Date: 05/16/2018 CLINICAL DATA:  Hepatic cirrhosis. EXAM: ULTRASOUND ABDOMEN LIMITED RIGHT UPPER QUADRANT COMPARISON:  None. FINDINGS: Gallbladder: Status post cholecystectomy. Common bile duct: Diameter: 3.5 mm which is within normal limits. Liver: No focal lesion identified. Mildly heterogeneous echotexture of hepatic parenchyma is noted with nodular hepatic contours suggesting cirrhosis. Mild ascites is noted. Portal vein is patent on color Doppler imaging with normal direction of blood flow towards the liver. IMPRESSION: Status post cholecystectomy. Findings consistent with hepatic cirrhosis. Mild ascites is noted around the liver. No focal abnormality is noted. Electronically Signed   By: Marijo Conception, M.D.   On: 05/16/2018 13:32      Medications:     Scheduled Medications: . aspirin  81 mg Oral BH-q7a  . Chlorhexidine Gluconate Cloth  6 each Topical Daily  . furosemide  80 mg Intravenous BID  .  lidocaine  1 patch Transdermal Q24H  . loratadine  10 mg Oral Daily  . macitentan  10 mg Oral Daily  . midodrine  5 mg Oral TID WC  . sodium chloride flush  10-40 mL Intracatheter Q12H  . sodium chloride flush  3 mL Intravenous Q12H  . sodium chloride flush  3 mL Intravenous Q12H  . spironolactone  12.5 mg Oral Daily  . tadalafil  40 mg Oral Daily  . Warfarin - Pharmacist Dosing Inpatient   Does not apply q1800     Infusions: . sodium chloride 10 mL/hr at 05/17/18 0600  . sodium chloride 10 mL/hr at 05/17/18 0600  . 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 Also has COPD (quit smoking in 2002 - 1 ppd x 25 years), HL, HTN   Directly admitted 05/14/18 with concerns for low output CHF.   Assessment/Plan   1. Pulmonary hypertension with cor pulmonale - Echo with bubble study 05/16/18 LVEF 55-60% with "late bubbles" , Mechanical AVR stable with trivial perivalvular regurg, Mild MR, Severe LAE, Severe RV dilation and reduced function. No PFO. Mod TR, PA peak pressure 68 mm Hg - Previous RHC with mod/severe pulm HTN with RV failure.   - RHC 05/14/18 with Mild/Moderate PAH in setting of high output with no evidence of intracardiac shunting. - Volume status improving. CVP 10 this am - Continue lasix 80 mg BID for at least this am.  - Consider removing swan this am.  - Continue adcirca 40 mg daily.  - Continue macitentan 10 mg daily.  - Serologies to date have been negative. Rheum appointment pending  - ? HHT/shunt/AVMs with late bubbles on bubble study.   2. Chronic respiratory failure - [place] CT 05/16/18 with "dependent basilar predominant patchy subpleural reticulation and ground-glass attenuation with the suggestion of minimal associated traction bronchiolectasis. No frank honeycombing. Findings may indicate an interstitial lung disease such as early usual interstitial pneumonia (UIP) or fibrotic phase nonspecific interstitial pneumonia (NSIP)." - Pulmonary following.    3. Chronic AFL - Rate controlled on lopressor.  - Continue coumadin with mechanical AVR.  - INR 2.42   4. CAD - s/p CABG.  - No s/s of ischemia.    - continue ASA. Will need statin   5. S/p mechanical AVR - Stable on most recent echo. Continue coumadin/ASA 81 - Aware of need for SBE prophylaxis   6. Leukocytoclastic vasculitis - f/u with Rheumatology - No  change to current plan.    7. Cirrhosis - US Abdomen RUQ 05/16/18 - s/p cholecystectomy. + hepatic cirrhosis. Mild ascites.  - Liver Doppler 05/16/18 - No hepatic, splenic, or portal venous thrombosis or occlusion. Mild ascites.  8. R Shoulder pain - Pulled and thrown by his horse last week. He wonders if he tore his bicep.   - Will discuss ortho consult with MD.   Medication concerns reviewed with patient and pharmacy team. Barriers identified: None at this time.   Length of Stay: 3  Lance Hicks, Lance Hicks  05/17/2018, 7:58 AM Advanced Heart Failure Team Pager [phone] (M-F; 7a -  4p)  Please contact [facility] for night-coverage after hours (4p -7a ) and weekends on [email or web address]    Agree with above.   Remains very tenuous. Continues to diurese with IV lasix. Weight now down 16 pounds. BP soft.  Bubble study with few late bubbles c/w peripheral shunting. Liver u/s with cirrhosis but no evidence of portal HTN. Throughout the day developed fevers and chills.Also c/o R shoulder and bicep pain.   Luiz Blare numbers tonight done personally. CVP 18 PA 74/29 (43) PCWP 32 Cardiac Output (Thermo) 4.01 Cardiac Index (Thermo)  2.12  Exam Weak appearing cyanotic RIJ swan JVP to ear Cor RRR + mechanical s2 2/6 TR Lungs coarse Ab soft  Ext cyanotic 1+ edema. R shoulder with limited ROM and painful  He remains quite tenuous. His hemodynamics remain quite variable. Initial RHC with severe PAH and low output. Cath earlier this week with moderate PAH and high output (normal PVR).Luiz Blare numbers today with worsening left-sided pressures. Unifying etiology remains unclear to moe. Will continue to diurese. Check auto-immune panels. With fever will pull swan, check bcx and start vanc. Discussed with CCM. Appreciate Ortho input with R shoulder.   CRITICAL CARE Performed by: Glori Bickers  Total critical care time: 45 minutes  Critical care time was exclusive of separately billable  procedures and treating other patients.  Critical care was necessary to treat or prevent imminent or life-threatening deterioration.  Critical care was time spent personally by me (independent of midlevel providers or residents) on the following activities: development of treatment plan with patient and/or surrogate as well as nursing, discussions with consultants, evaluation of patient's response to treatment, examination of patient, obtaining history from patient or surrogate, ordering and performing treatments and interventions, ordering and review of laboratory studies, ordering and review of radiographic studies, pulse oximetry and re-evaluation of patient's condition.  Glori Bickers, MD  8:57 PM
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
## 2018-05-17 NOTE — Progress Notes (Signed)
Pharmacy Antibiotic Note  Lance Hicks is a 72 y.o. male  with fever  Pharmacy has been consulted for vancomycin dosing (source may be pulmonary artery cath for removal today). -WBC= 5.6, tmax= 101.1, SCr= 1.4 (trend up), CrCl ~ 45  Plan: -Vancomycin 1597m IV followed by 1001mIV q24h -Will follow renal function, cultures and clinical progress   Height: 5' 8" (172.7 cm) Weight: 150 lb 12.7 oz (68.4 kg) IBW/kg (Calculated) : 68.4  Temp (24hrs), Avg:99.3 F (37.4 C), Min:96.8 F (36 C), Max:101.1 F (38.4 C)  Recent Labs  Lab 05/14/18 1310 05/15/18 0549 05/16/18 0335 05/17/18 0415  WBC 6.3 4.8 5.8 5.6  CREATININE 1.39* 1.32* 1.24 1.40*    Estimated Creatinine Clearance: 46.8 mL/min (A) (by C-G formula based on SCr of 1.4 mg/dL (H)).    Allergies  Allergen Reactions  . Amoxicillin-Pot Clavulanate Other (See Comments)    Stomach pain Has patient had a PCN reaction causing immediate rash, facial/tongue/throat swelling, SOB or lightheadedness with hypotension: No Has patient had a PCN reaction causing severe rash involving mucus membranes or skin necrosis: No Has patient had a PCN reaction that required hospitalization: No Has patient had a PCN reaction occurring within the last 10 years: Yes If all of the above answers are "NO", then may proceed with Cephalosporin use.   . Tape Rash and Other (See Comments)    Surgical tape    Antimicrobials this admission: 5/24 vanc>>  Dose adjustments this admission:   Microbiology results: 5/24 blood x2   Thank you for allowing pharmacy to be a part of this patient's care.  MeDareen Piano/24/2019 5:42 PM

## 2018-05-17 NOTE — Progress Notes (Signed)
Orthopedic Tech Progress Note Patient Details:  Lance Hicks [birth year] [id number]  Ortho Devices Type of Ortho Device: Shoulder immobilizer Ortho Device/Splint Location: bilateral Ortho Device/Splint Interventions: Application   Post Interventions Patient Tolerated: Well Instructions Provided: Care of device   [place] Pink 05/17/2018, 4:44 PM

## 2018-05-18 LAB — PROTIME-INR
INR: 2.33
Prothrombin Time: 25.3 seconds — ABNORMAL HIGH (ref 11.4–15.2)

## 2018-05-18 LAB — BASIC METABOLIC PANEL
Anion gap: 11 (ref 5–15)
BUN: 21 mg/dL — ABNORMAL HIGH (ref 6–20)
CO2: 23 mmol/L (ref 22–32)
Calcium: 8.7 mg/dL — ABNORMAL LOW (ref 8.9–10.3)
Chloride: 98 mmol/L — ABNORMAL LOW (ref 101–111)
Creatinine, Ser: 1.31 mg/dL — ABNORMAL HIGH (ref 0.61–1.24)
GFR calc Af Amer: >60 mL/min (ref >60)
GFR calc non Af Amer: 53 mL/min — ABNORMAL LOW (ref >60)
Glucose, Bld: 86 mg/dL (ref 65–99)
Potassium: 3.8 mmol/L (ref 3.5–5.1)
Sodium: 132 mmol/L — ABNORMAL LOW (ref 135–145)

## 2018-05-18 LAB — SJOGRENS SYNDROME-A EXTRACTABLE NUCLEAR ANTIBODY: SSA (Ro) (ENA) Antibody, IgG: <0.2 AI (ref 0.0–0.9)

## 2018-05-18 LAB — ALDOLASE: Aldolase: 5.9 U/L (ref 3.3–10.3)

## 2018-05-18 LAB — HEPATIC FUNCTION PANEL
ALT: 13 U/L — ABNORMAL LOW (ref 17–63)
AST: 39 U/L (ref 15–41)
Albumin: 2.9 g/dL — ABNORMAL LOW (ref 3.5–5.0)
Alkaline Phosphatase: 69 U/L (ref 38–126)
Bilirubin, Direct: 1.1 mg/dL — ABNORMAL HIGH (ref 0.1–0.5)
Indirect Bilirubin: 1.4 mg/dL — ABNORMAL HIGH (ref 0.3–0.9)
Total Bilirubin: 2.5 mg/dL — ABNORMAL HIGH (ref 0.3–1.2)
Total Protein: 7.3 g/dL (ref 6.5–8.1)

## 2018-05-18 LAB — CBC
HCT: 37.7 % — ABNORMAL LOW (ref 39.0–52.0)
Hemoglobin: 12.1 g/dL — ABNORMAL LOW (ref 13.0–17.0)
MCH: 27.4 pg (ref 26.0–34.0)
MCHC: 32.1 g/dL (ref 30.0–36.0)
MCV: 85.3 fL (ref 78.0–100.0)
Platelets: 179 10*3/uL (ref 150–400)
RBC: 4.42 MIL/uL (ref 4.22–5.81)
RDW: 17.6 % — ABNORMAL HIGH (ref 11.5–15.5)
WBC: 4.4 10*3/uL (ref 4.0–10.5)

## 2018-05-18 LAB — RHEUMATOID FACTOR: Rhuematoid fact SerPl-aCnc: <10 IU/mL (ref 0.0–13.9)

## 2018-05-18 LAB — ANTI-SCLERODERMA ANTIBODY: Scleroderma (Scl-70) (ENA) Antibody, IgG: 0.5 AI (ref 0.0–0.9)

## 2018-05-18 LAB — SJOGRENS SYNDROME-B EXTRACTABLE NUCLEAR ANTIBODY: SSB (La) (ENA) Antibody, IgG: <0.2 AI (ref 0.0–0.9)

## 2018-05-18 LAB — ANTI-JO 1 ANTIBODY, IGG: Anti JO-1: <0.2 AI (ref 0.0–0.9)

## 2018-05-18 LAB — ANTI-DNA ANTIBODY, DOUBLE-STRANDED: ds DNA Ab: <1 IU/mL (ref 0–9)

## 2018-05-18 LAB — VITAMIN B1: Vitamin B1 (Thiamine): 88.1 nmol/L (ref 66.5–200.0)

## 2018-05-18 MED ORDER — MIDODRINE HCL 5 MG PO TABS
10.0000 mg | ORAL_TABLET | Freq: Three times a day (TID) | ORAL | Status: DC
  Administered 2018-05-18 – 2018-05-23 (×14): 10 mg via ORAL
  Filled 2018-05-18 (×15): qty 2

## 2018-05-18 MED ORDER — WARFARIN SODIUM 5 MG PO TABS
5.0000 mg | ORAL_TABLET | Freq: Once | ORAL | Status: AC
  Administered 2018-05-18: 5 mg via ORAL
  Filled 2018-05-18: qty 1

## 2018-05-18 NOTE — Progress Notes (Addendum)
Advanced Heart Failure Rounding Note  PCP-Cardiologist: No primary care provider on file.   Subjective:     Continues to diurese. Feeling much better. Able to walk 500 feet. Seen by ortho for R shoulder pain. Felt to have possible rotator cuff tear. Pain improved with narcotics. RIJ swan now out. On vanc. T max 101.1.   Co-ox 73%  Studies:  RHC 05/14/18 showed Mild/Moderate PAH in setting of high output with no evidence of intracardiac shunting. Hemodynamics as below.   High-Res Chest CT 05/16/18 1. Slightly irregular 0.9 cm peripheral right middle lobe solid pulmonary nodule. 3 month follow up recommended.  2. Dependent basilar predominant patchy subpleural reticulation and ground-glass attenuation with the suggestion of minimal associated traction bronchiolectasis. No frank honeycombing. Findings may indicate an interstitial lung disease such as early usual interstitial pneumonia (UIP) or fibrotic phase nonspecific interstitial pneumonia (NSIP). Suggest a follow-up high-resolution chest CT study in 6-12 months to assess temporal pattern stability, as clinically warranted. 3. Mild cardiomegaly. Minimal interlobular septal thickening and trace dependent bilateral pleural effusions.  4. Small to moderate volume perihepatic ascites. 5. Ectatic 4.3 cm ascending thoracic aorta. Recommend annual imaging followup by CTA or MRA.  6. Left main and 3 vessel coronary atherosclerosis.  US Abdomen RUQ 05/16/18 - s/p cholecystectomy. + hepatic cirrhosis. Mild ascites.   Liver Doppler 05/16/18 - No hepatic, splenic, or portal venous thrombosis or occlusion. Mild ascites.  Echo with bubble study 05/16/18 LVEF 55-60% with "late bubbles" , Mechanical AVR stable with trivial perivalvular regurg, Mild MR, Severe LAE, Severe RV dilation and reduced function. No PFO. Mod TR, PA peak pressure 68 mm Hg   Swan numbers PA 60-19 (32) CVP ~10 CO 5.4 CI 2.9  RHC 05/14/18 RA = 18 RV = 71/17 PA  = 69/24 (39) PCW = 20 Fick cardiac output/index = 7.0/3.7 Thermo CO/CI = 7.2/3.8 PVR = 2.6 WU Ao sat = 97% PA sat =  73%, 73% SVC sat = 73%  RA sat = 72%  Objective:   Weight Range: 66.8 kg (147 lb 4.3 oz) Body mass index is 22.39 kg/m.   Vital Signs:   Temp:  [97.7 F (36.5 C)-101.1 F (38.4 C)] 98.2 F (36.8 C) (05/25 1235) Resp:  [11-24] 20 (05/25 1500) BP: (71-134)/(52-87) 109/75 (05/25 1500) SpO2:  [91 %-100 %] 100 % (05/25 1500) Weight:  [66.8 kg (147 lb 4.3 oz)] 66.8 kg (147 lb 4.3 oz) (05/25 0535) Last BM Date: 05/15/18  Weight change: Filed Weights   05/16/18 0500 05/17/18 0500 05/18/18 0535  Weight: 71.5 kg (157 lb 10.1 oz) 68.4 kg (150 lb 12.7 oz) 66.8 kg (147 lb 4.3 oz)    Intake/Output:   Intake/Output Summary (Last 24 hours) at 05/18/2018 1613 Last data filed at 05/18/2018 1231 Gross per 24 hour  Intake 890 ml  Output 1630 ml  Net -740 ml      Physical Exam    General:  Sitting up in bed  No resp difficulty HEENT: normal Neck: supple. JVP to jaw. Carotids 2+ bilat; no bruits. No lymphadenopathy or thryomegaly appreciated. Cor: PMI nondisplaced. Irreg mech s2. 2/6 TR Lungs: clear but decreased throughout Abdomen: soft, nontender, nondistended. No hepatosplenomegaly. No bruits or masses. Good bowel sounds. Extremities no clubbing, rash, 1+ edema  Mild cyanois Neuro: alert & orientedx3, cranial nerves grossly intact. moves all 4 extremities w/o difficulty. Affect pleasant  Telemetry   Afib 80-90s, personally reviewed  EKG    No new tracings.  Labs    CBC Recent Labs    05/17/18 0415 05/18/18 0234  WBC 5.6 4.4  HGB 11.2* 12.1*  HCT 34.5* 37.7*  MCV 83.9 85.3  PLT 206 409   Basic Metabolic Panel Recent Labs    05/16/18 0806 05/17/18 0415 05/18/18 0234  NA  --  134* 132*  K  --  4.2 3.8  CL  --  99* 98*  CO2  --  24 23  GLUCOSE  --  87 86  BUN  --  24* 21*  CREATININE  --  1.40* 1.31*  CALCIUM  --  8.6* 8.7*  MG 1.8  --    --    Liver Function Tests Recent Labs    05/18/18 0234  AST 39  ALT 13*  ALKPHOS 69  BILITOT 2.5*  PROT 7.3  ALBUMIN 2.9*   No results for input(s): LIPASE, AMYLASE in the last 72 hours. Cardiac Enzymes No results for input(s): CKTOTAL, CKMB, CKMBINDEX, TROPONINI in the last 72 hours.  BNP: BNP (last 3 results) Recent Labs    01/09/18 1705 05/14/18 1310  BNP 668.0* 1,247.0*    ProBNP (last 3 results) No results for input(s): PROBNP in the last 8760 hours.   D-Dimer No results for input(s): DDIMER in the last 72 hours. Hemoglobin A1C No results for input(s): HGBA1C in the last 72 hours. Fasting Lipid Panel No results for input(s): CHOL, HDL, LDLCALC, TRIG, CHOLHDL, LDLDIRECT in the last 72 hours. Thyroid Function Tests No results for input(s): TSH, T4TOTAL, T3FREE, THYROIDAB in the last 72 hours.  Invalid input(s): FREET3  Other results:   Imaging    No results found.   Medications:     Scheduled Medications: . aspirin  81 mg Oral BH-q7a  . Chlorhexidine Gluconate Cloth  6 each Topical Daily  . furosemide  80 mg Intravenous BID  . lidocaine  1 patch Transdermal Q24H  . loratadine  10 mg Oral Daily  . macitentan  10 mg Oral Daily  . midodrine  5 mg Oral TID WC  . sodium chloride flush  10-40 mL Intracatheter Q12H  . sodium chloride flush  3 mL Intravenous Q12H  . sodium chloride flush  3 mL Intravenous Q12H  . spironolactone  12.5 mg Oral Daily  . tadalafil  40 mg Oral Daily  . warfarin  5 mg Oral ONCE-1800  . Warfarin - Pharmacist Dosing Inpatient   Does not apply q1800    Infusions: . sodium chloride Stopped (05/17/18 1834)  . sodium chloride Stopped (05/17/18 1834)  . 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 Also has COPD (quit smoking in 2002 - 1 ppd x 25 years), HL, HTN   Directly admitted 05/14/18 with concerns for low output CHF.   Assessment/Plan   1. Pulmonary hypertension with cor pulmonale - Echo with bubble study 05/16/18 LVEF 55-60% with "late bubbles" , Mechanical AVR stable with trivial perivalvular regurg, Mild MR, Severe LAE, Severe RV dilation and reduced function. No PFO. Mod TR, PA peak pressure 68 mm Hg - Previous RHC with mod/severe pulm HTN with RV failure.   -  [place] 05/14/18 with Mild/Moderate PAH in setting of high output with no evidence of intracardiac shunting. - He remains quite tenuous. His hemodynamics remain quite variable. Initial RHC with severe PAH and low output. Cath earlier this week with moderate PAH and high output (normal PVR).Luiz Blare numbers 5/24 with worsening left-sided pressures. Unifying etiology remains unclear. - Volume status continues to improve. Weight down 19 pounds total. Will continue IV diuresis one more day - Continue adcirca 40 mg daily.  - Continue macitentan 10 mg daily.  - Increase midodrine to 10 tid  - Auto-immune serologies resent but remain negative - ? HHT/shunt/AVMs with late bubbles on bubble study.   2. Chronic respiratory failure - High Rest CT 05/16/18 with "dependent basilar predominant patchy subpleural reticulation and ground-glass attenuation with the suggestion of minimal associated traction bronchiolectasis. No frank honeycombing. Findings may indicate an interstitial lung disease such as early usual interstitial pneumonia (UIP) or fibrotic phase nonspecific interstitial pneumonia (NSIP)." - Pulmonary following.    3. Chronic AFL - Rate controlled on lopressor.  - Continue coumadin with mechanical AVR.  - INR 2.33.  Discussed dosing with PharmD personally.   4. CAD - s/p CABG.  - No s/s of ischemia.    - continue ASA. Will need statin   5. S/p mechanical AVR - Stable on most recent echo. Continue coumadin/ASA 81 - Aware of need for SBE prophylaxis   6. Leukocytoclastic vasculitis - f/u with Rheumatology - No change to current plan.    7. Cirrhosis - US Abdomen RUQ 05/16/18 - s/p cholecystectomy. + hepatic cirrhosis. Mild ascites.  - Liver Doppler 05/16/18 - No hepatic, splenic, or portal venous thrombosis or occlusion. Mild ascites. - Increase midodrine  8. R Shoulder pain - ? Rotator cuff tear. Ortho has seen  9. Fever - ? Line infection. BCx negative. Swan removed 5/24 - Continue vancomycin for now  Medication concerns reviewed with patient and pharmacy team. Barriers identified: None at this time.   Length of Stay: Pooler, MD  05/18/2018, 4:13 PM Advanced Heart Failure Team Pager [phone] (M-F; 7a - 4p)  Please contact [facility] for night-coverage after hours (4p -7a ) and weekends on [email or web address]

## 2018-05-18 NOTE — Evaluation (Addendum)
Physical Therapy Evaluation Patient Details Name: Lance Hicks MRN: [id number] DOB: Nov 24, 1946 Today's Date: 05/18/2018   History of Present Illness  72 y.o. admitted after feeling weak with no energy for days, low BP, SOB with minimal exertion. Patient also had 2 syncopal events while tending his horses, and recently sustained a R shoulder injury with orthopedics following. PMH includes: permanent AF, CAD, aortic stenosis, CABG, aortic valve replacement, wrist surgery, foot surgery,     Clinical Impression  Pt admitted with above diagnosis. Pt currently with functional limitations due to the deficits listed below (see PT Problem List). PTA pt living alone on 1 level house independent with all mobility. Upon eval pt presents with R shoulder pain, mild unsteadiness on his feet, ambulating the unit with supervision and O2 sats >90% on RA with activity .  Pt will benefit from skilled PT to increase their independence and safety with mobility to allow discharge to the venue listed below.    Vitals:  Seated BP 90/57 MAP 69, HR 89, SpO2 100 RA  After activity (500' walking):  120/96  MAP 102  HR 106   SpO2 97% RA     Follow Up Recommendations Outpatient PT    Equipment Recommendations  None recommended by PT    Recommendations for Other Services OT consult     Precautions / Restrictions Precautions Precautions: Fall Restrictions Weight Bearing Restrictions: No      Mobility  Bed Mobility Overal bed mobility: Modified Independent                Transfers Overall transfer level: Modified independent Equipment used: None                Ambulation/Gait Ambulation/Gait assistance: Supervision Ambulation Distance (Feet): 500 Feet Assistive device: None Gait Pattern/deviations: Step-through pattern Gait velocity: decreased   General Gait Details: mild unsteadiness ambulating no overt LOB  Stairs            Wheelchair Mobility    Modified Rankin  (Stroke Patients Only)       Balance Overall balance assessment: Mild deficits observed, not formally tested                                           Pertinent Vitals/Pain Pain Assessment: Faces Pain Score: 6  Pain Location: R shoulder Pain Descriptors / Indicators: Discomfort Pain Intervention(s): Limited activity within patient's tolerance;Monitored during session;Premedicated before session;Repositioned    Home Living Family/patient expects to be discharged to:: Private residence Living Arrangements: Alone Available Help at Discharge: Friend(s);Family;Available PRN/intermittently Type of Home: House Home Access: Level entry     Home Layout: One level Home Equipment: Cane - single point      Prior Function Level of Independence: Independent               Hand Dominance   Dominant Hand: Right    Extremity/Trunk Assessment   Upper Extremity Assessment Upper Extremity Assessment: (R shoulder injury unable to lift above 90* LUE WNL)    Lower Extremity Assessment Lower Extremity Assessment: Overall WFL for tasks assessed       Communication   Communication: No difficulties  Cognition Arousal/Alertness: Awake/alert Behavior During Therapy: WFL for tasks assessed/performed Overall Cognitive Status: Within Functional Limits for tasks assessed  General Comments      Exercises     Assessment/Plan    PT Assessment Patient needs continued PT services  PT Problem List Decreased strength;Decreased range of motion;Pain;Decreased activity tolerance;Decreased balance       PT Treatment Interventions DME instruction;Gait training;Stair training;Therapeutic activities;Functional mobility training;Therapeutic exercise    PT Goals (Current goals can be found in the Care Plan section)  Acute Rehab PT Goals Patient Stated Goal: go home follow up with OP therapy. Find out why he hasn't been  feeling well PT Goal Formulation: With patient Time For Goal Achievement: 06/01/18 Potential to Achieve Goals: Good    Frequency Min 3X/week   Barriers to discharge Decreased caregiver support lives alone    Co-evaluation               AM-PAC PT "6 Clicks" Daily Activity  Outcome Measure Difficulty turning over in bed (including adjusting bedclothes, sheets and blankets)?: None Difficulty moving from lying on back to sitting on the side of the bed? : None Difficulty sitting down on and standing up from a chair with arms (e.g., wheelchair, bedside commode, etc,.)?: None Help needed moving to and from a bed to chair (including a wheelchair)?: A Little Help needed walking in hospital room?: A Little Help needed climbing 3-5 steps with a railing? : A Little 6 Click Score: 21    End of Session Equipment Utilized During Treatment: Gait belt Activity Tolerance: Patient tolerated treatment well Patient left: in chair;with call bell/phone within reach Nurse Communication: Mobility status PT Visit Diagnosis: Unsteadiness on feet (R26.81);Pain Pain - Right/Left: Right Pain - part of body: Shoulder    Time: 5241-5901 PT Time Calculation (min) (ACUTE ONLY): 27 min   Charges:   PT Evaluation $PT Eval Low Complexity: 1 Low PT Treatments $Gait Training: 8-22 mins   PT G Codes:       Reinaldo Berber, PT, DPT Acute Rehab Services Pager: [phone]    Reinaldo Berber 05/18/2018, 3:49 PM

## 2018-05-18 NOTE — Progress Notes (Signed)
ANTICOAGULATION CONSULT NOTE - [place] for warfarin Indication: atrial fibrillation, mechanical AVR  Allergies  Allergen Reactions  . Amoxicillin-Pot Clavulanate Other (See Comments)    Stomach pain Has patient had a PCN reaction causing immediate rash, facial/tongue/throat swelling, SOB or lightheadedness with hypotension: No Has patient had a PCN reaction causing severe rash involving mucus membranes or skin necrosis: No Has patient had a PCN reaction that required hospitalization: No Has patient had a PCN reaction occurring within the last 10 years: Yes If all of the above answers are "NO", then may proceed with Cephalosporin use.   . Tape Rash and Other (See Comments)    Surgical tape    Patient Measurements: Height: _0  (172.7 cm) Weight: 147 lb 4.3 oz (66.8 kg) IBW/kg (Calculated) : 68.4   Vital Signs: Temp: 97.8 F (36.6 C) (05/25 0351) Temp Source: Oral (05/25 0351) BP: 93/61 (05/25 0600)  Labs: Recent Labs    05/16/18 0335 05/16/18 0806 05/16/18 1000 05/16/18 1711 05/17/18 0415 05/17/18 0535 05/18/18 0234  HGB 11.0*  --   --   --  11.2*  --  12.1*  HCT 33.9*  --   --   --  34.5*  --  37.7*  PLT 127*  --   --   --  206  --  179  LABPROT  --  24.5*  --   --  26.1*  --  25.3*  INR  --  2.23  --   --  2.42  --  2.33  HEPARINUNFRC  --   --  0.52 0.64  --  0.46  --   CREATININE 1.24  --   --   --  1.40*  --  1.31*    Estimated Creatinine Clearance: 48.9 mL/min (A) (by C-G formula based on SCr of 1.31 mg/dL (H)).   Medical History: Past Medical History:  Diagnosis Date  . 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 In light of concurrent left nephrolithiasis, will check CT renal colic next year instead of renal US.   [name] Anticoagulated on Coumadin 01/04/2018  . Anxiety 05/10/2016   Last Assessment & Plan:  Doing well off of zoloft.  . Asymptomatic microscopic hematuria  06/20/2017   Last Assessment & Plan:  Had hematuria workup in [facility] in 2016 which negative CT and cystoscopy. UA with 2+ blood last visit - we discussed recommendation for repeat workup at 5 years or if degree of hematuria progresses.   . Atrial fibrillation ([place]) 03/29/2017  . Atrial flutter ([place]) 03/29/2017  . Chronic allergic rhinitis 04/28/2016   Last Assessment & Plan:  Continue astelin  . Chronic anticoagulation 03/29/2017  . Chronic atrial fibrillation ([place]) 07/23/2015   Last Assessment & Plan:  Coumadin and metoprolol, cardiology referral to establish care.  . Chronic midline back pain 12/14/2015   Last Assessment & Plan:  Pain management referral for further evaluation.  . Chronic prostatitis 07/23/2015   Last Assessment & Plan:  Has largely resolved since stopping bike riding. Recommend annual DRE AND PSA - will see back 12/2015 for annual screening, given 1st degree fhx. To call office for recurrent prostatitis symptoms.   [name] COPD (chronic obstructive pulmonary disease) ([place]) 06/12/2016  . Coronary artery disease involving native coronary artery of native heart with angina pectoris ([place]) 03/29/2017  . Cough 10/17/2016   Last Assessment & Plan:  Discussed typical course for acute viral illness. If symptoms  worsen or fail to improve by 7-10d, delayed ATBs, fluids, rest, NSAIDs/APAP prn. Seek care if not improving. Needs earlier INR check due to ATBs.  [name] Dyspnea 02/01/2016   Last Assessment & Plan:  Overall improving, eval by pulm, plan for CT, neg stress test with cardiology. Recent switch to carvedilol due to side effects.  . Epidermoid cyst of skin 08/24/2017  . Essential hypertension 12/14/2015   Last Assessment & Plan:  Hypertension control: controlled  Medications: compliant Medication Management: as noted in orders Home blood pressure monitoring recommended additionally as needed for symptoms  The patient's care plan was reviewed and updated. Instructions and counseling were provided regarding patient  goals and barriers. He was counseled to adopt a healthy lifestyle. Educational resources and self-management tools have been provided as charted in [facility] list.   . H/O maze procedure 03/29/2017  . [name] 03/29/2017   Overview:  2011  . Hx of CABG 03/29/2017  . Hyperlipidemia 03/29/2017  . Hypertensive heart disease 03/29/2017  . Kidney stones 07/23/2015   Overview:  x 3  Last Assessment & Plan:  By [place] has left nephrolithiasis, but not visible by KUB. Will check CT renal colic next year to assess both stone burden as well as to surveil AML.   . Leukocytoclastic vasculitis ([place]) 10/01/2017  . Localized edema 01/04/2018  . Lumbar radicular pain 01/19/2016  . Maculopapular rash 09/03/2017  . Nephrolithiasis 07/23/2015   Overview:  x 3  Last Assessment & Plan:  By [place] has left nephrolithiasis, but not visible by KUB. Will check CT renal colic next year to assess both stone burden as well as to surveil AML.   Overview:  x 3  Last Assessment & Plan:  Has 50m nonobstructing LUP stone - not visible by KUB.  Will check renal [place] - he will contact office sooner if symptomatic.   . Non-sustained ventricular tachycardia ([place] 03/29/2017  . Other hyperlipidemia 03/29/2017  . Palpitations 10/01/2017  . Paroxysmal atrial fibrillation ([place] 03/29/2017  . Paroxysmal atrial fibrillation ([place] 03/29/2017  . Pleural effusion, bilateral 08/09/2017  . Post-nasal drainage 02/25/2016   Last Assessment & Plan:  Trial zyrtec and flonase  . Prostate cancer screening 06/20/2017   Last Assessment & Plan:  Recommend continued annual CaP screening until within 10 years of life expectancy. Given good health and fhx of longevity, would anticipate CaP screening to continue until age 72  PSA today and again in one year on day of visit.  . Pulmonary hypertension ([place] 08/09/2017  . Pulmonary nodules 06/12/2016  . S/P AVR (aortic valve replacement) 03/20/2016  . Supratherapeutic INR 07/26/2017  . Syncope 03/29/2017  .  Syncope and collapse 02/01/2016  . Typical atrial flutter ([place] 02/01/2016   Assessment: 89yoM on warfarin for AFib and mechanical AVR presents with SOB. INR subtherapeutic on admit, started on IV heparin bridge. INR remains subtherapeutic at 2.33 this morning, heparin remains off, CBC stable.  PTA Dose = 2.5101md except 4m13mat  Goal of Therapy:  (INR goal 2.5-3.5 per PCP) Monitor platelets by anticoagulation protocol: Yes   Plan:  -Warfarin 4mg23m x1 -Daily INR   MichArrie SenatearmD, BCPS PGY-2 Cardiology Pharmacy Resident Pager: [phone]

## 2018-05-19 ENCOUNTER — Inpatient Hospital Stay (HOSPITAL_COMMUNITY): Payer: Medicare Other

## 2018-05-19 LAB — BASIC METABOLIC PANEL
Anion gap: 11 (ref 5–15)
BUN: 26 mg/dL — ABNORMAL HIGH (ref 6–20)
CO2: 25 mmol/L (ref 22–32)
Calcium: 8.8 mg/dL — ABNORMAL LOW (ref 8.9–10.3)
Chloride: 91 mmol/L — ABNORMAL LOW (ref 101–111)
Creatinine, Ser: 1.41 mg/dL — ABNORMAL HIGH (ref 0.61–1.24)
GFR calc Af Amer: 56 mL/min — ABNORMAL LOW (ref >60)
GFR calc non Af Amer: 49 mL/min — ABNORMAL LOW (ref >60)
Glucose, Bld: 91 mg/dL (ref 65–99)
Potassium: 4 mmol/L (ref 3.5–5.1)
Sodium: 127 mmol/L — ABNORMAL LOW (ref 135–145)

## 2018-05-19 LAB — CBC
HCT: 39.1 % (ref 39.0–52.0)
Hemoglobin: 12.6 g/dL — ABNORMAL LOW (ref 13.0–17.0)
MCH: 27.3 pg (ref 26.0–34.0)
MCHC: 32.2 g/dL (ref 30.0–36.0)
MCV: 84.8 fL (ref 78.0–100.0)
Platelets: 175 10*3/uL (ref 150–400)
RBC: 4.61 MIL/uL (ref 4.22–5.81)
RDW: 17.3 % — ABNORMAL HIGH (ref 11.5–15.5)
WBC: 4.6 10*3/uL (ref 4.0–10.5)

## 2018-05-19 LAB — BLOOD CULTURE ID PANEL (REFLEXED)

## 2018-05-19 LAB — PROTIME-INR
INR: 2.63
Prothrombin Time: 27.9 seconds — ABNORMAL HIGH (ref 11.4–15.2)

## 2018-05-19 MED ORDER — WARFARIN SODIUM 2.5 MG PO TABS
2.5000 mg | ORAL_TABLET | Freq: Once | ORAL | Status: AC
Start: 2018-05-19 — End: 2018-05-19
  Administered 2018-05-19: 2.5 mg via ORAL
  Filled 2018-05-19: qty 1

## 2018-05-19 MED ORDER — CYCLOBENZAPRINE HCL 10 MG PO TABS
5.0000 mg | ORAL_TABLET | Freq: Once | ORAL | Status: AC
  Administered 2018-05-19: 5 mg via ORAL
  Filled 2018-05-19: qty 1

## 2018-05-19 NOTE — Progress Notes (Signed)
Advanced Heart Failure Rounding Note  PCP-Cardiologist: No primary care provider on file.   Subjective:     Continues to diurese. Weight down another 2 pounds (21 pounds total).   Feeling much better. No orthopnea or PND. Walked with PT yesterday  Seen by ortho for R shoulder pain. MRI with rotator cuff and bicep tear. Pain improved with narcotics and sling  Remains on vanc for ? Line infection. BCx negative. Now AF. Midodrine increase yesterday. SBP 80-100.  Studies:  RHC 05/14/18 showed Mild/Moderate PAH in setting of high output with no evidence of intracardiac shunting. Hemodynamics as below.   High-Res Chest CT 05/16/18 1. Slightly irregular 0.9 cm peripheral right middle lobe solid pulmonary nodule. 3 month follow up recommended.  2. Dependent basilar predominant patchy subpleural reticulation and ground-glass attenuation with the suggestion of minimal associated traction bronchiolectasis. No frank honeycombing. Findings may indicate an interstitial lung disease such as early usual interstitial pneumonia (UIP) or fibrotic phase nonspecific interstitial pneumonia (NSIP). Suggest a follow-up high-resolution chest CT study in 6-12 months to assess temporal pattern stability, as clinically warranted. 3. Mild cardiomegaly. Minimal interlobular septal thickening and trace dependent bilateral pleural effusions.  4. Small to moderate volume perihepatic ascites. 5. Ectatic 4.3 cm ascending thoracic aorta. Recommend annual imaging followup by CTA or MRA.  6. Left main and 3 vessel coronary atherosclerosis.  US Abdomen RUQ 05/16/18 - s/p cholecystectomy. + hepatic cirrhosis. Mild ascites.   Liver Doppler 05/16/18 - No hepatic, splenic, or portal venous thrombosis or occlusion. Mild ascites.  Echo with bubble study 05/16/18 LVEF 55-60% with "late bubbles" , Mechanical AVR stable with trivial perivalvular regurg, Mild MR, Severe LAE, Severe RV dilation and reduced function. No PFO.  Mod TR, PA peak pressure 68 mm Hg   Swan numbers PA 60-19 (32) CVP ~10 CO 5.4 CI 2.9  RHC 05/14/18 RA = 18 RV = 71/17 PA = 69/24 (39) PCW = 20 Fick cardiac output/index = 7.0/3.7 Thermo CO/CI = 7.2/3.8 PVR = 2.6 WU Ao sat = 97% PA sat =  73%, 73% SVC sat = 73%  RA sat = 72%  Objective:   Weight Range: 66 kg (145 lb 8.1 oz) Body mass index is 22.12 kg/m.   Vital Signs:   Temp:  [97.5 F (36.4 C)-98.2 F (36.8 C)] 97.5 F (36.4 C) (05/26 1149) Resp:  [11-25] 11 (05/26 1200) BP: (81-141)/(57-89) 106/70 (05/26 1200) SpO2:  [0 %-100 %] 97 % (05/26 1200) Weight:  [66 kg (145 lb 8.1 oz)] 66 kg (145 lb 8.1 oz) (05/26 0500) Last BM Date: 05/15/18  Weight change: Filed Weights   05/17/18 0500 05/18/18 0535 05/19/18 0500  Weight: 68.4 kg (150 lb 12.7 oz) 66.8 kg (147 lb 4.3 oz) 66 kg (145 lb 8.1 oz)    Intake/Output:   Intake/Output Summary (Last 24 hours) at 05/19/2018 1305 Last data filed at 05/19/2018 0600 Gross per 24 hour  Intake 440 ml  Output 1600 ml  Net -1160 ml      Physical Exam    General:  Sitting up in bed. No resp difficulty HEENT: normal Neck: supple. JVP 9-10 with prominent CV waves . Carotids 2+ bilat; no bruits. No lymphadenopathy or thryomegaly appreciated. Cor: PMI nondisplaced. Irregular rate & rhythm. Mechanical s2  2/6TR Lungs: clear mildly decreased throughout  Abdomen: soft, nontender, nondistended. No hepatosplenomegaly. No bruits or masses. Good bowel sounds. Extremities: mild cyanosis. No clubbing, rash, 1-2+ edema under UNNA boots  Neuro: alert & orientedx3,  cranial nerves grossly intact. moves all 4 extremities w/o difficulty. Affect pleasant  Telemetry   Afib 70-80s, personally reviewed  EKG    No new tracings.    Labs    CBC Recent Labs    05/18/18 0234 05/19/18 0325  WBC 4.4 4.6  HGB 12.1* 12.6*  HCT 37.7* 39.1  MCV 85.3 84.8  PLT 179 151   Basic Metabolic Panel Recent Labs    05/18/18 0234 05/19/18 0325    NA 132* 127*  K 3.8 4.0  CL 98* 91*  CO2 23 25  GLUCOSE 86 91  BUN 21* 26*  CREATININE 1.31* 1.41*  CALCIUM 8.7* 8.8*   Liver Function Tests Recent Labs    05/18/18 0234  AST 39  ALT 13*  ALKPHOS 69  BILITOT 2.5*  PROT 7.3  ALBUMIN 2.9*   No results for input(s): LIPASE, AMYLASE in the last 72 hours. Cardiac Enzymes No results for input(s): CKTOTAL, CKMB, CKMBINDEX, TROPONINI in the last 72 hours.  BNP: BNP (last 3 results) Recent Labs    01/09/18 1705 05/14/18 1310  BNP 668.0* 1,247.0*    ProBNP (last 3 results) No results for input(s): PROBNP in the last 8760 hours.   D-Dimer No results for input(s): DDIMER in the last 72 hours. Hemoglobin A1C No results for input(s): HGBA1C in the last 72 hours. Fasting Lipid Panel No results for input(s): CHOL, HDL, LDLCALC, TRIG, CHOLHDL, LDLDIRECT in the last 72 hours. Thyroid Function Tests No results for input(s): TSH, T4TOTAL, T3FREE, THYROIDAB in the last 72 hours.  Invalid input(s): FREET3  Other results:   Imaging    Mr Shoulder Right Wo Contrast  Result Date: 05/19/2018 CLINICAL DATA:  A horse pulled the patient's right shoulder 3 days prior to imaging, with the medial pain and weakness. EXAM: MRI OF THE RIGHT SHOULDER WITHOUT CONTRAST TECHNIQUE: Multiplanar, multisequence MR imaging of the shoulder was performed. No intravenous contrast was administered. COMPARISON:  Radiographs 05/17/2018 FINDINGS: Despite efforts by the technologist and patient, motion artifact is present on today's exam and could not be eliminated. This reduces exam sensitivity and specificity. Rotator cuff: Full-thickness, partial width tear of the majority of the supraspinatus tendon, with a small minority of the tendon thought to still attached anteriorly. The central portion of the tendon is retracted 1.8 cm and the width of the tear is about 2.1 cm. Mild thinning of the distal infraspinatus tendon suggesting partial thickness articular  surface tearing distally. Mild subscapularis tendinopathy. Muscles: Low-level edema tracks within and along the supraspinatus muscle. Biceps long head: Poor visualization of the intra-articular segment of the long head compatible with least partial tearing. Acromioclavicular Joint: Prior distal clavicular resection or acromioplasty. Type II acromion. Small but abnormal amount of fluid in the subacromial subdeltoid bursa and subcoracoid bursa. Glenohumeral Joint: The humeral head is minimally superiorly subluxed. Moderate spurring observed Labrum: Indeterminate due to the severity of motion artifact, but the superior labrum appears blunted and degenerated and could be torn. Bones: No significant extra-articular osseous abnormalities identified. Other: No supplemental non-categorized findings. IMPRESSION: 1. Full-thickness tear of the vast majority of the supraspinatus tendon, probably with a small amount of tendon still attaching anteriorly, although this is not totally certain. The tendon is retracted 1.8 cm, and there is low-level edema within along the supraspinatus muscle. 2. Mild distal infraspinatus partial-thickness articular surface tearing. Mild subscapularis tendinopathy. 3. Poor visualization of the intra-articular segment of the long head of the biceps compatible with at least partial tearing. 4. Despite  efforts by the technologist and patient, motion artifact is present on today's exam and could not be eliminated. This reduces exam sensitivity and specificity. 5. As expected, there is fluid in the subacromial subdeltoid bursa. 6. Moderate degenerative glenohumeral arthropathy. 7. There is at least blunting and degeneration of the superior labrum. The labrum is obscured by motion artifact. Electronically Signed   By: Van Clines M.D.   On: 05/19/2018 10:19     Medications:     Scheduled Medications: . aspirin  81 mg Oral BH-q7a  . Chlorhexidine Gluconate Cloth  6 each Topical Daily  .  furosemide  80 mg Intravenous BID  . lidocaine  1 patch Transdermal Q24H  . loratadine  10 mg Oral Daily  . macitentan  10 mg Oral Daily  . midodrine  10 mg Oral TID WC  . sodium chloride flush  10-40 mL Intracatheter Q12H  . sodium chloride flush  3 mL Intravenous Q12H  . sodium chloride flush  3 mL Intravenous Q12H  . spironolactone  12.5 mg Oral Daily  . tadalafil  40 mg Oral Daily  . warfarin  2.5 mg Oral ONCE-1800  . Warfarin - Pharmacist Dosing Inpatient   Does not apply q1800    Infusions: . sodium chloride Stopped (05/17/18 1834)  . sodium chloride Stopped (05/17/18 1834)  . 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 Also has COPD (quit smoking in 2002 - 1 ppd x 25 years), HL, HTN   Directly admitted 05/14/18 with concerns for low output CHF.   Assessment/Plan   1. Pulmonary hypertension with cor pulmonale - Echo with bubble study 05/16/18 LVEF 55-60% with "late bubbles" , Mechanical AVR stable with trivial perivalvular regurg, Mild MR, Severe LAE, Severe RV dilation and reduced function. No PFO. Mod TR, PA peak pressure 68 mm Hg - RHC 1/19 with mod/severe pulm HTN with RV failure.   - RHC 05/14/18 with Mild/Moderate PAH in setting of high output with no evidence of intracardiac shunting. Luiz Blare #s 5/24 increasing PAH now with elevated left-sided pressures and normal output - Ab u/s with cirrhosis but no evidence of portal HTN - He remains quite tenuous. His hemodynamics remain quite variable. Initial RHC with severe PAH  and low output. Cath earlier this week with moderate PAH and high output (normal PVR). Swan numbers 5/24 with worsening left-sided pressures. Unifying etiology remains unclear. - Volume status continues to improve. Weight down 21 pounds total. Will continue IV diuresis 1-2 more days then switch to po torsemide - Continue adcirca 40 mg daily.  - Continue macitentan 10 mg daily.  - Continue midodrine 10 tid  - Auto-immune serologies re-sent but remain negative - ? HHT/shunt/AVMs with late bubbles on bubble study.   2. Chronic respiratory failure - [place] CT 05/16/18 with "dependent basilar predominant patchy subpleural reticulation and ground-glass attenuation with the suggestion of minimal associated traction bronchiolectasis. No frank honeycombing. Findings may indicate an interstitial lung disease such as early usual interstitial pneumonia (UIP) or fibrotic phase nonspecific interstitial pneumonia (NSIP)." - Pulmonary following.    3. Chronic AFL - Rate controlled  on lopressor.  - Continue coumadin with mechanical AVR.  - INR 2.63. Discussed dosing with PharmD personally.   4. CAD - s/p CABG.  - No s/s of ischemia.    - continue ASA. Will need statin   5. S/p mechanical AVR - Stable on most recent echo. Continue coumadin/ASA 81 - Aware of need for SBE prophylaxis   6. Leukocytoclastic vasculitis - f/u with Rheumatology - No change to current plan.    7. Cirrhosis - US Abdomen RUQ 05/16/18 - s/p cholecystectomy. + hepatic cirrhosis. Mild ascites.  - Liver Doppler 05/16/18 - No hepatic, splenic, or portal venous thrombosis or occlusion. Mild ascites. - Increase midodrine  8. R Shoulder pain - ? Rotator cuff tear. Ortho has seen  9. Fever - ? Line infection. BCx negative. Now AF. Swan removed 5/24 - Continue vancomycin for now  Can go to SDU.    Length of Stay: [place], [place]  05/19/2018, 1:05 PM Advanced Heart Failure Team Pager [phone] (M-F; 7a - 4p)    Please contact [facility] for night-coverage after hours (4p -7a ) and weekends on [email or web address]

## 2018-05-19 NOTE — Progress Notes (Signed)
ANTICOAGULATION CONSULT NOTE - [place] for warfarin Indication: atrial fibrillation, mechanical AVR  Allergies  Allergen Reactions  . Amoxicillin-Pot Clavulanate Other (See Comments)    Stomach pain Has patient had a PCN reaction causing immediate rash, facial/tongue/throat swelling, SOB or lightheadedness with hypotension: No Has patient had a PCN reaction causing severe rash involving mucus membranes or skin necrosis: No Has patient had a PCN reaction that required hospitalization: No Has patient had a PCN reaction occurring within the last 10 years: Yes If all of the above answers are "NO", then may proceed with Cephalosporin use.   . Tape Rash and Other (See Comments)    Surgical tape    Patient Measurements: Height: _0  (172.7 cm) Weight: 145 lb 8.1 oz (66 kg) IBW/kg (Calculated) : 68.4   Vital Signs: Temp: 97.5 F (36.4 C) (05/26 0746) Temp Source: Oral (05/26 0746) BP: 99/68 (05/26 0700)  Labs: Recent Labs    05/16/18 1000 05/16/18 1711  05/17/18 0415 05/17/18 0535 05/18/18 0234 05/19/18 0325  HGB  --   --    < > 11.2*  --  12.1* 12.6*  HCT  --   --   --  34.5*  --  37.7* 39.1  PLT  --   --   --  206  --  179 175  LABPROT  --   --   --  26.1*  --  25.3* 27.9*  INR  --   --   --  2.42  --  2.33 2.63  HEPARINUNFRC 0.52 0.64  --   --  0.46  --   --   CREATININE  --   --   --  1.40*  --  1.31* 1.41*   < > = values in this interval not displayed.    Estimated Creatinine Clearance: 44.9 mL/min (A) (by C-G formula based on SCr of 1.41 mg/dL (H)).   Medical History: Past Medical History:  Diagnosis Date  . 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 In light of concurrent left nephrolithiasis, will check CT renal colic next year instead of renal US.   [name] Anticoagulated on Coumadin 01/04/2018  . Anxiety 05/10/2016   Last Assessment & Plan:  Doing well off of zoloft.  .  Asymptomatic microscopic hematuria 06/20/2017   Last Assessment & Plan:  Had hematuria workup in [facility] in 2016 which negative CT and cystoscopy. UA with 2+ blood last visit - we discussed recommendation for repeat workup at 5 years or if degree of hematuria progresses.   . Atrial fibrillation ([place]) 03/29/2017  . Atrial flutter ([place]) 03/29/2017  . Chronic allergic rhinitis 04/28/2016   Last Assessment & Plan:  Continue astelin  . Chronic anticoagulation 03/29/2017  . Chronic atrial fibrillation ([place]) 07/23/2015   Last Assessment & Plan:  Coumadin and metoprolol, cardiology referral to establish care.  . Chronic midline back pain 12/14/2015   Last Assessment & Plan:  Pain management referral for further evaluation.  . Chronic prostatitis 07/23/2015   Last Assessment & Plan:  Has largely resolved since stopping bike riding. Recommend annual DRE AND PSA - will see back 12/2015 for annual screening, given 1st degree fhx. To call office for recurrent prostatitis symptoms.   [name] COPD (chronic obstructive pulmonary disease) ([place]) 06/12/2016  . Coronary artery disease involving native coronary artery of native heart with angina pectoris ([place]) 03/29/2017  . Cough 10/17/2016   Last Assessment &  Plan:  Discussed typical course for acute viral illness. If symptoms worsen or fail to improve by 7-10d, delayed ATBs, fluids, rest, NSAIDs/APAP prn. Seek care if not improving. Needs earlier INR check due to ATBs.  [name] Dyspnea 02/01/2016   Last Assessment & Plan:  Overall improving, eval by pulm, plan for CT, neg stress test with cardiology. Recent switch to carvedilol due to side effects.  . Epidermoid cyst of skin 08/24/2017  . Essential hypertension 12/14/2015   Last Assessment & Plan:  Hypertension control: controlled  Medications: compliant Medication Management: as noted in orders Home blood pressure monitoring recommended additionally as needed for symptoms  The patient's care plan was reviewed and updated. Instructions and  counseling were provided regarding patient goals and barriers. He was counseled to adopt a healthy lifestyle. Educational resources and self-management tools have been provided as charted in [facility] list.   . H/O maze procedure 03/29/2017  . [name] 03/29/2017   Overview:  2011  . Hx of CABG 03/29/2017  . Hyperlipidemia 03/29/2017  . Hypertensive heart disease 03/29/2017  . Kidney stones 07/23/2015   Overview:  x 3  Last Assessment & Plan:  By [place] has left nephrolithiasis, but not visible by KUB. Will check CT renal colic next year to assess both stone burden as well as to surveil AML.   . Leukocytoclastic vasculitis ([place]) 10/01/2017  . Localized edema 01/04/2018  . Lumbar radicular pain 01/19/2016  . Maculopapular rash 09/03/2017  . Nephrolithiasis 07/23/2015   Overview:  x 3  Last Assessment & Plan:  By [place] has left nephrolithiasis, but not visible by KUB. Will check CT renal colic next year to assess both stone burden as well as to surveil AML.   Overview:  x 3  Last Assessment & Plan:  Has 87m nonobstructing LUP stone - not visible by KUB.  Will check renal [place]2019 - he will contact office sooner if symptomatic.   . Non-sustained ventricular tachycardia ([place] 03/29/2017  . Other hyperlipidemia 03/29/2017  . Palpitations 10/01/2017  . Paroxysmal atrial fibrillation ([place] 03/29/2017  . Paroxysmal atrial fibrillation ([place] 03/29/2017  . Pleural effusion, bilateral 08/09/2017  . Post-nasal drainage 02/25/2016   Last Assessment & Plan:  Trial zyrtec and flonase  . Prostate cancer screening 06/20/2017   Last Assessment & Plan:  Recommend continued annual CaP screening until within 10 years of life expectancy. Given good health and fhx of longevity, would anticipate CaP screening to continue until age 72  PSA today and again in one year on day of visit.  . Pulmonary hypertension ([place] 08/09/2017  . Pulmonary nodules 06/12/2016  . S/P AVR (aortic valve replacement) 03/20/2016  . Supratherapeutic  INR 07/26/2017  . Syncope 03/29/2017  . Syncope and collapse 02/01/2016  . Typical atrial flutter ([place] 02/01/2016   Assessment: 89yoM on warfarin for AFib and mechanical AVR presents with SOB. INR subtherapeutic on admit, started on IV heparin bridge. INR remains therapeutic at 2.6, CBC stable.  PTA Dose = 2.531md except 80m68mat  Goal of Therapy:  (INR goal 2.5-3.5 per PCP) Monitor platelets by anticoagulation protocol: Yes   Plan:  -Warfarin 2.80mg109m x1 -Daily INR   MichArrie SenatearmD, BCPS PGY-2 Cardiology Pharmacy Resident Pager: [phone]

## 2018-05-19 NOTE — Progress Notes (Signed)
PHARMACY - PHYSICIAN COMMUNICATION CRITICAL VALUE ALERT - BLOOD CULTURE IDENTIFICATION (BCID)  Lance Hicks is an 72 y.o. male who presented to [facility] on 05/14/2018    Assessment: BCID positive for enterococcus  Name of physician (or Provider) Contacted:    Current antibiotics: Vancomycin  Changes to prescribed antibiotics recommended:  Patient is on recommended antibiotics - No changes needed  No results found for this or any previous visit.  Jodean Lima Lance Hicks 05/19/2018  8:53 PM

## 2018-05-20 DIAGNOSIS — I071 Rheumatic tricuspid insufficiency: Secondary | ICD-10-CM

## 2018-05-20 DIAGNOSIS — B952 Enterococcus as the cause of diseases classified elsewhere: Secondary | ICD-10-CM

## 2018-05-20 DIAGNOSIS — I2510 Atherosclerotic heart disease of native coronary artery without angina pectoris: Secondary | ICD-10-CM

## 2018-05-20 DIAGNOSIS — Z91048 Other nonmedicinal substance allergy status: Secondary | ICD-10-CM

## 2018-05-20 DIAGNOSIS — Z87891 Personal history of nicotine dependence: Secondary | ICD-10-CM

## 2018-05-20 DIAGNOSIS — I350 Nonrheumatic aortic (valve) stenosis: Secondary | ICD-10-CM

## 2018-05-20 DIAGNOSIS — X58XXXA Exposure to other specified factors, initial encounter: Secondary | ICD-10-CM

## 2018-05-20 DIAGNOSIS — N183 Chronic kidney disease, stage 3 (moderate): Secondary | ICD-10-CM

## 2018-05-20 DIAGNOSIS — Z881 Allergy status to other antibiotic agents status: Secondary | ICD-10-CM

## 2018-05-20 DIAGNOSIS — J449 Chronic obstructive pulmonary disease, unspecified: Secondary | ICD-10-CM

## 2018-05-20 DIAGNOSIS — T80219A Unspecified infection due to central venous catheter, initial encounter: Secondary | ICD-10-CM

## 2018-05-20 DIAGNOSIS — R7881 Bacteremia: Secondary | ICD-10-CM

## 2018-05-20 DIAGNOSIS — S43401A Unspecified sprain of right shoulder joint, initial encounter: Secondary | ICD-10-CM

## 2018-05-20 DIAGNOSIS — Z951 Presence of aortocoronary bypass graft: Secondary | ICD-10-CM

## 2018-05-20 DIAGNOSIS — I509 Heart failure, unspecified: Secondary | ICD-10-CM

## 2018-05-20 DIAGNOSIS — R58 Hemorrhage, not elsewhere classified: Secondary | ICD-10-CM

## 2018-05-20 DIAGNOSIS — M75101 Unspecified rotator cuff tear or rupture of right shoulder, not specified as traumatic: Secondary | ICD-10-CM

## 2018-05-20 DIAGNOSIS — I130 Hypertensive heart and chronic kidney disease with heart failure and stage 1 through stage 4 chronic kidney disease, or unspecified chronic kidney disease: Secondary | ICD-10-CM

## 2018-05-20 DIAGNOSIS — Z8249 Family history of ischemic heart disease and other diseases of the circulatory system: Secondary | ICD-10-CM

## 2018-05-20 DIAGNOSIS — R011 Cardiac murmur, unspecified: Secondary | ICD-10-CM

## 2018-05-20 DIAGNOSIS — K7460 Unspecified cirrhosis of liver: Secondary | ICD-10-CM

## 2018-05-20 DIAGNOSIS — I4891 Unspecified atrial fibrillation: Secondary | ICD-10-CM

## 2018-05-20 DIAGNOSIS — Z952 Presence of prosthetic heart valve: Secondary | ICD-10-CM

## 2018-05-20 LAB — BASIC METABOLIC PANEL
Anion gap: 12 (ref 5–15)
BUN: 26 mg/dL — ABNORMAL HIGH (ref 6–20)
CO2: 26 mmol/L (ref 22–32)
Calcium: 9 mg/dL (ref 8.9–10.3)
Chloride: 92 mmol/L — ABNORMAL LOW (ref 101–111)
Creatinine, Ser: 1.36 mg/dL — ABNORMAL HIGH (ref 0.61–1.24)
GFR calc Af Amer: 59 mL/min — ABNORMAL LOW (ref >60)
GFR calc non Af Amer: 51 mL/min — ABNORMAL LOW (ref >60)
Glucose, Bld: 107 mg/dL — ABNORMAL HIGH (ref 65–99)
Potassium: 3.8 mmol/L (ref 3.5–5.1)
Sodium: 130 mmol/L — ABNORMAL LOW (ref 135–145)

## 2018-05-20 LAB — CBC
HCT: 37.6 % — ABNORMAL LOW (ref 39.0–52.0)
Hemoglobin: 12.1 g/dL — ABNORMAL LOW (ref 13.0–17.0)
MCH: 26.7 pg (ref 26.0–34.0)
MCHC: 32.2 g/dL (ref 30.0–36.0)
MCV: 83 fL (ref 78.0–100.0)
Platelets: 190 10*3/uL (ref 150–400)
RBC: 4.53 MIL/uL (ref 4.22–5.81)
RDW: 17 % — ABNORMAL HIGH (ref 11.5–15.5)
WBC: 5.4 10*3/uL (ref 4.0–10.5)

## 2018-05-20 LAB — PROTIME-INR
INR: 2.62
Prothrombin Time: 27.8 seconds — ABNORMAL HIGH (ref 11.4–15.2)

## 2018-05-20 LAB — ANTINUCLEAR ANTIBODIES, IFA: ANA Ab, IFA: NEGATIVE

## 2018-05-20 LAB — ANCA TITERS
Atypical P-ANCA titer: <1:20 {titer}
C-ANCA: <1:20 {titer}
P-ANCA: <1:20 {titer}

## 2018-05-20 MED ORDER — SODIUM CHLORIDE 0.9 % IV SOLN
2.0000 g | Freq: Four times a day (QID) | INTRAVENOUS | Status: DC
  Administered 2018-05-20 – 2018-05-23 (×12): 2 g via INTRAVENOUS
  Filled 2018-05-20 (×16): qty 2000

## 2018-05-20 MED ORDER — WARFARIN SODIUM 2.5 MG PO TABS
2.5000 mg | ORAL_TABLET | Freq: Once | ORAL | Status: AC
  Administered 2018-05-20: 2.5 mg via ORAL
  Filled 2018-05-20: qty 1

## 2018-05-20 NOTE — Progress Notes (Signed)
Written consent obtained and witnessed by this RN for TEE tomorrow by Dr. Haroldine Laws.  Patient did not have any questions.

## 2018-05-20 NOTE — Progress Notes (Addendum)
Pharmacy Antibiotic Note  Lance Hicks is a 72 y.o. male admitted on 05/14/2018 with SOB. Pt developed fevers once Swan pulled and started on empiric vancomycin, subsequently found to have 1 of 2 blood cultures positive for Enterococcus. Renal function has remained stable, susceptibilities pending, will order vancomycin trough for tonight.  Plan: -Vancomycin 1052m IV q24h -Vancomycin trough prior to 4th dose tonight  ADDENDUM: ID now following, recommending to transition to ampicillin. Augmentin allergy noted by ID, per pt this is simply cramps. Pt will need TEE. CrCl ~45 ml/min.  Plan: -Ampicillin 2g IV q6h -If Cr improves increase to q4h -F/U endocarditis w/u  Height: _0  (172.7 cm) Weight: 142 lb 13.7 oz (64.8 kg) IBW/kg (Calculated) : 68.4  Temp (24hrs), Avg:97.8 F (36.6 C), Min:97.3 F (36.3 C), Max:98.1 F (36.7 C)  Recent Labs  Lab 05/16/18 0335 05/17/18 0415 05/18/18 0234 05/19/18 0325 05/20/18 0223  WBC 5.8 5.6 4.4 4.6 5.4  CREATININE 1.24 1.40* 1.31* 1.41* 1.36*    Estimated Creatinine Clearance: 45.7 mL/min (A) (by C-G formula based on SCr of 1.36 mg/dL (H)).    Allergies  Allergen Reactions  . Amoxicillin-Pot Clavulanate Other (See Comments)    Stomach pain Has patient had a PCN reaction causing immediate rash, facial/tongue/throat swelling, SOB or lightheadedness with hypotension: No Has patient had a PCN reaction causing severe rash involving mucus membranes or skin necrosis: No Has patient had a PCN reaction that required hospitalization: No Has patient had a PCN reaction occurring within the last 10 years: Yes If all of the above answers are "NO", then may proceed with Cephalosporin use.   . Tape Rash and Other (See Comments)    Surgical tape    Antimicrobials this admission: Vancomycin 5/24 >> 5/26 Ampicillin 5/27 >>  Dose adjustments this admission: none  Microbiology results: 5/23 MRSA: neg 5/24 BCx: 1o2 Enterococcus 5/26 BCx:  sent  Thank you for allowing pharmacy to be a part of this patient's care.  MArrie Senate PharmD, BCPS PGY-2 Cardiology Pharmacy Resident Pager: [phone]/27/2019

## 2018-05-20 NOTE — Consult Note (Signed)
[facility]    Date of Admission:  05/14/2018     Total Days of Antibiotics 4          Reason for Consult: Enterococcus Bacteremia  Referring Provider: [place] Primary Care Provider: Townsend Roger, MD   Assessment/Plan:  Lance Hicks is the 72 y/o male with previous history of COPD,  aortic stenosis s/p AVR in 2010 admitted for lightheadedness and hypotension. Cardiac function has been variable since admission with question of possible Swan Ganz infection with new onset fever of 100.9 on 5/24. Started on empiric vancomycin. [name] catheter removed. TTE was negative for vegetation. Blood cultures on 5/24 positive for Enterococcus species in 1/4 bottles. Repeat blood cultures pending. There remains significant concern for endocarditis given his prosthetic valve. He does have allergies to Augmentin. Length of therapy pending TEE. He does also have cirrhosis which appears stable currently.   Of note he was noted to have right shoulder pain related to being pulled by his horses. MRI with no evidence of infection.  1. Discontinue vancomycin. Start ampicillin. 2. Needs TEE to rule out endocarditis.  3. Continue to monitor kidney function.   Active Problems:   Pulmonary hypertension ([place])   . aspirin  81 mg Oral BH-q7a  . Chlorhexidine Gluconate Cloth  6 each Topical Daily  . furosemide  80 mg Intravenous BID  . lidocaine  1 patch Transdermal Q24H  . loratadine  10 mg Oral Daily  . macitentan  10 mg Oral Daily  . midodrine  10 mg Oral TID WC  . sodium chloride flush  3 mL Intravenous Q12H  . spironolactone  12.5 mg Oral Daily  . tadalafil  40 mg Oral Daily  . warfarin  2.5 mg Oral ONCE-1800  . Warfarin - Pharmacist Dosing Inpatient   Does not apply q1800     HPI: Lance Hicks is a 72 y.o. male with previous medical history of AF, CAD, aortic stenosis s/p CABG, AVR with #25 Carbomedics valve in 2010, HL and HTN presented [facility] with hypotension, lightheadedness and peripheral edema. Severity included 2 episodes of syncope when taking care of his horses.  Right heart cath performed 5/21 with mild/severe pulmonary hypertension and high output state. TTE completed on 5/23 with no evidence of endocarditis. Treated with IV Lasix and has responded well, however having worsening left sided pressures on 5/24. Concern for cirrhosis which was confirmed with ultrasound. High resolution CT with with concern for interstitual lung disease, interstitual pneumonia, or fibrotic phase nonspecific interstitual pneumonia. He had a new onset fever orf 100.9 on 5/24 with question of line infection with orders placed to remove the Swan Ganz catheter and check blood cultures. He was started on vancomycin.   Also noted to have right shoulder pain with MRI showing tear of the supraspinatus and labrum with no evidence of infection. Blood cultures were positive for Enterococcus species in 1/4 bottles. Culture with Gram Positive Cocci and remains pending. Repeat blood cultures drawn on 5/26 and are pending. He has remained afebrile since initial 100.9 48 hours ago and has no leukocytosis. Creatinine of 1.36 with borderline CKD Stage 3.   Review of Systems: Review of Systems  Constitutional: Negative for chills, diaphoresis and fever.  Respiratory: Negative for cough, sputum production, shortness of breath and wheezing.   Cardiovascular: Negative for chest pain and palpitations.  Gastrointestinal: Negative for constipation, diarrhea, nausea and vomiting.  Genitourinary: Negative for dysuria, frequency  and urgency.  Skin: Negative for rash.     Past Medical History:  Diagnosis Date  . 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 In light of concurrent left nephrolithiasis, will check CT renal colic next year instead of renal US.   [name] Anticoagulated on Coumadin 01/04/2018  .  Anxiety 05/10/2016   Last Assessment & Plan:  Doing well off of zoloft.  . Asymptomatic microscopic hematuria 06/20/2017   Last Assessment & Plan:  Had hematuria workup [facility] in 2016 which negative CT and cystoscopy. UA with 2+ blood last visit - we discussed recommendation for repeat workup at 5 years or if degree of hematuria progresses.   . Atrial fibrillation ([place]) 03/29/2017  . Atrial flutter ([place]) 03/29/2017  . Chronic allergic rhinitis 04/28/2016   Last Assessment & Plan:  Continue astelin  . Chronic anticoagulation 03/29/2017  . Chronic atrial fibrillation ([place]) 07/23/2015   Last Assessment & Plan:  Coumadin and metoprolol, cardiology referral to establish care.  . Chronic midline back pain 12/14/2015   Last Assessment & Plan:  Pain management referral for further evaluation.  . Chronic prostatitis 07/23/2015   Last Assessment & Plan:  Has largely resolved since stopping bike riding. Recommend annual DRE AND PSA - will see back 12/2015 for annual screening, given 1st degree fhx. To call office for recurrent prostatitis symptoms.   [name] COPD (chronic obstructive pulmonary disease) ([place]) 06/12/2016  . Coronary artery disease involving native coronary artery of native heart with angina pectoris ([place]) 03/29/2017  . Cough 10/17/2016   Last Assessment & Plan:  Discussed typical course for acute viral illness. If symptoms worsen or fail to improve by 7-10d, delayed ATBs, fluids, rest, NSAIDs/APAP prn. Seek care if not improving. Needs earlier INR check due to ATBs.  [name] Dyspnea 02/01/2016   Last Assessment & Plan:  Overall improving, eval by pulm, plan for CT, neg stress test with cardiology. Recent switch to carvedilol due to side effects.  . Epidermoid cyst of skin 08/24/2017  . Essential hypertension 12/14/2015   Last Assessment & Plan:  Hypertension control: controlled  Medications: compliant Medication Management: as noted in orders Home blood pressure monitoring recommended additionally as needed for symptoms   The patient's care plan was reviewed and updated. Instructions and counseling were provided regarding patient goals and barriers. He was counseled to adopt a healthy lifestyle. Educational resources and self-management tools have been provided as charted in [facility] list.   . H/O maze procedure 03/29/2017  . [name] 03/29/2017   Overview:  2011  . Hx of CABG 03/29/2017  . Hyperlipidemia 03/29/2017  . Hypertensive heart disease 03/29/2017  . Kidney stones 07/23/2015   Overview:  x 3  Last Assessment & Plan:  By [place] has left nephrolithiasis, but not visible by KUB. Will check CT renal colic next year to assess both stone burden as well as to surveil AML.   . Leukocytoclastic vasculitis ([place]) 10/01/2017  . Localized edema 01/04/2018  . Lumbar radicular pain 01/19/2016  . Maculopapular rash 09/03/2017  . Nephrolithiasis 07/23/2015   Overview:  x 3  Last Assessment & Plan:  By [place] has left nephrolithiasis, but not visible by KUB. Will check CT renal colic next year to assess both stone burden as well as to surveil AML.   Overview:  x 3  Last Assessment & Plan:  Has 62m nonobstructing LUP stone - not visible by KUB.  Will check renal US 07/2018 - he will contact office sooner if symptomatic.   . Non-sustained ventricular tachycardia ([place]) 03/29/2017  . Other hyperlipidemia 03/29/2017  . Palpitations 10/01/2017  . Paroxysmal atrial fibrillation ([place]) 03/29/2017  . Paroxysmal atrial fibrillation ([place]) 03/29/2017  . Pleural effusion, bilateral 08/09/2017  . Post-nasal drainage 02/25/2016   Last Assessment & Plan:  Trial zyrtec and flonase  . Prostate cancer screening 06/20/2017   Last Assessment & Plan:  Recommend continued annual CaP screening until within 10 years of life expectancy. Given good health and fhx of longevity, would anticipate CaP screening to continue until age 72.  PSA today and again in one year on day of visit.  . Pulmonary hypertension ([place]) 08/09/2017  . Pulmonary nodules 06/12/2016  .  S/P AVR (aortic valve replacement) 03/20/2016  . Supratherapeutic INR 07/26/2017  . Syncope 03/29/2017  . Syncope and collapse 02/01/2016  . Typical atrial flutter ([place]) 02/01/2016    Social History   Tobacco Use  . Smoking status: Former Smoker    Packs/day: 2.00    Years: 34.00    Pack years: 68.00    Types: Cigarettes    Last attempt to quit: 07/16/2000    Years since quitting: 17.8  . Smokeless tobacco: Never Used  Substance Use Topics  . Alcohol use: Yes  . Drug use: No    Family History  Problem Relation Age of Onset  . Asthma Mother   . Arthritis Mother   . Heart attack Father   . Hypertension Father   . Stroke Paternal Grandmother     Allergies  Allergen Reactions  . Amoxicillin-Pot Clavulanate Other (See Comments)    Stomach pain Has patient had a PCN reaction causing immediate rash, facial/tongue/throat swelling, SOB or lightheadedness with hypotension: No Has patient had a PCN reaction causing severe rash involving mucus membranes or skin necrosis: No Has patient had a PCN reaction that required hospitalization: No Has patient had a PCN reaction occurring within the last 10 years: Yes If all of the above answers are "NO", then may proceed with Cephalosporin use.   . Tape Rash and Other (See Comments)    Surgical tape    OBJECTIVE: Blood pressure 90/60, pulse 88, temperature 97.8 F (36.6 C), temperature source Oral, resp. rate (!) 21, height _0  (1.727 m), weight 142 lb 13.7 oz (64.8 kg), SpO2 93 %.  Physical Exam  Constitutional: He is oriented to person, place, and time. He appears well-developed and well-nourished. No distress.  Pleasant; Lying in bed with head up  Eyes:  Eyes appear jaundice  Neck: Neck supple.  Cardiovascular: Normal rate and intact distal pulses. An irregularly irregular rhythm present.  Murmur heard. Mechanical click noted.   Pulmonary/Chest: Effort normal and breath sounds normal.  Abdominal: Soft. Bowel sounds are normal.    Neurological: He is alert and oriented to person, place, and time.  Skin: Skin is warm and dry. No rash noted.  Bilateral legs with compression wraps.   Psychiatric: He has a normal mood and affect. His behavior is normal.    Lab Results Lab Results  Component Value Date   WBC 5.4 05/20/2018   HGB 12.1 (L) 05/20/2018   HCT 37.6 (L) 05/20/2018   MCV 83.0 05/20/2018   PLT 190 05/20/2018    Lab Results  Component Value Date   CREATININE 1.36 (H) 05/20/2018   BUN 26 (H) 05/20/2018   NA 130 (L) 05/20/2018   K 3.8 05/20/2018   CL 92 (  L) 05/20/2018   CO2 26 05/20/2018    Lab Results  Component Value Date   ALT 13 (L) 05/18/2018   AST 39 05/18/2018   ALKPHOS 69 05/18/2018   BILITOT 2.5 (H) 05/18/2018     Microbiology: Recent Results (from the past 240 hour(s))  Surgical pcr screen     Status: None   Collection Time: 05/16/18  9:32 AM  Result Value Ref Range Status   MRSA, PCR NEGATIVE NEGATIVE Final   Staphylococcus aureus NEGATIVE NEGATIVE Final    Comment: (NOTE) The Xpert SA Assay (FDA approved for NASAL specimens in patients 24 years of age and older), is one component of a comprehensive surveillance program. It is not intended to diagnose infection nor to guide or monitor treatment. Performed at [facility], [place] [address] [place], [place] [address]   Culture, blood (routine x 2)     Status: None (Preliminary result)   Collection Time: 05/17/18  6:35 PM  Result Value Ref Range Status   Specimen Description BLOOD BLOOD RIGHT WRIST  Final   Special Requests   Final    AEROBIC BOTTLE ONLY Blood Culture results may not be optimal due to an inadequate volume of blood received in culture bottles   Culture   Final    NO GROWTH 2 DAYS Performed at [facility], [place] [address], [place], [place] [address]    Report Status PENDING  Incomplete  Culture, blood (routine x 2)     Status: None (Preliminary result)   Collection Time: 05/17/18  6:51 PM  Result  Value Ref Range Status   Specimen Description BLOOD RIGHT HAND  Final   Special Requests   Final    BOTTLES DRAWN AEROBIC ONLY Blood Culture results may not be optimal due to an inadequate volume of blood received in culture bottles   Culture  Setup Time   Final    GRAM POSITIVE COCCI IN PAIRS AEROBIC BOTTLE ONLY CRITICAL RESULT CALLED TO, READ BACK BY AND VERIFIED WITH: TRUDISILL,PHARMD _0  05/19/18 BY LHOWARD Performed at [facility], [place] [address], [place], [place] [address]    Culture GRAM POSITIVE COCCI  Final   Report Status PENDING  Incomplete  Blood Culture ID Panel (Reflexed)     Status: Abnormal   Collection Time: 05/17/18  6:51 PM  Result Value Ref Range Status   Enterococcus species DETECTED (A) NOT DETECTED Final    Comment: CRITICAL RESULT CALLED TO, READ BACK BY AND VERIFIED WITH: TRUDISILL,PHARMD _1  05/19/18 BY LHOWARD    Vancomycin resistance NOT DETECTED NOT DETECTED Final   Listeria monocytogenes NOT DETECTED NOT DETECTED Final   Staphylococcus species NOT DETECTED NOT DETECTED Final   Staphylococcus aureus NOT DETECTED NOT DETECTED Final   Streptococcus species NOT DETECTED NOT DETECTED Final   Streptococcus agalactiae NOT DETECTED NOT DETECTED Final   Streptococcus pneumoniae NOT DETECTED NOT DETECTED Final   Streptococcus pyogenes NOT DETECTED NOT DETECTED Final   Acinetobacter baumannii NOT DETECTED NOT DETECTED Final   Enterobacteriaceae species NOT DETECTED NOT DETECTED Final   Enterobacter cloacae complex NOT DETECTED NOT DETECTED Final   Escherichia coli NOT DETECTED NOT DETECTED Final   Klebsiella oxytoca NOT DETECTED NOT DETECTED Final   Klebsiella pneumoniae NOT DETECTED NOT DETECTED Final   Proteus species NOT DETECTED NOT DETECTED Final   Serratia marcescens NOT DETECTED NOT DETECTED Final   Haemophilus influenzae NOT DETECTED NOT DETECTED Final   Neisseria meningitidis NOT DETECTED NOT DETECTED Final  Pseudomonas aeruginosa NOT  DETECTED NOT DETECTED Final   Candida albicans NOT DETECTED NOT DETECTED Final   Candida glabrata NOT DETECTED NOT DETECTED Final   Candida krusei NOT DETECTED NOT DETECTED Final   Candida parapsilosis NOT DETECTED NOT DETECTED Final   Candida tropicalis NOT DETECTED NOT DETECTED Final    Comment: Performed at [facility], [place] [address], [place], [place] [address]     Terri Piedra, [place] for [place] Pager  05/20/2018  8:04 AM

## 2018-05-20 NOTE — Progress Notes (Signed)
Advanced Heart Failure Rounding Note  PCP-Cardiologist: No primary care provider on file.   Subjective:     Continues to diurese. Weight down another 3 pounds (24 pounds total).   Feeling much better. Walking unit with PT. RUE now in sling. Denies SOB, orthopnea or PND. BP improved with increase in midodrine yesterday.   BCx 1/3 + for enterococcus. ID has seen. Changed vanc to ampicillin. I have ordered TEE for tomorrow   Studies:  RHC 05/14/18 showed Mild/Moderate PAH in setting of high output with no evidence of intracardiac shunting. Hemodynamics as below.   High-Res Chest CT 05/16/18 1. Slightly irregular 0.9 cm peripheral right middle lobe solid pulmonary nodule. 3 month follow up recommended.  2. Dependent basilar predominant patchy subpleural reticulation and ground-glass attenuation with the suggestion of minimal associated traction bronchiolectasis. No frank honeycombing. Findings may indicate an interstitial lung disease such as early usual interstitial pneumonia (UIP) or fibrotic phase nonspecific interstitial pneumonia (NSIP). Suggest a follow-up high-resolution chest CT study in 6-12 months to assess temporal pattern stability, as clinically warranted. 3. Mild cardiomegaly. Minimal interlobular septal thickening and trace dependent bilateral pleural effusions.  4. Small to moderate volume perihepatic ascites. 5. Ectatic 4.3 cm ascending thoracic aorta. Recommend annual imaging followup by CTA or MRA.  6. Left main and 3 vessel coronary atherosclerosis.  US Abdomen RUQ 05/16/18 - s/p cholecystectomy. + hepatic cirrhosis. Mild ascites.   Liver Doppler 05/16/18 - No hepatic, splenic, or portal venous thrombosis or occlusion. Mild ascites.  Echo with bubble study 05/16/18 LVEF 55-60% with "late bubbles" , Mechanical AVR stable with trivial perivalvular regurg, Mild MR, Severe LAE, Severe RV dilation and reduced function. No PFO. Mod TR, PA peak pressure 68 mm  Hg   Swan numbers PA 60-19 (32) CVP ~10 CO 5.4 CI 2.9  RHC 05/14/18 RA = 18 RV = 71/17 PA = 69/24 (39) PCW = 20 Fick cardiac output/index = 7.0/3.7 Thermo CO/CI = 7.2/3.8 PVR = 2.6 WU Ao sat = 97% PA sat =  73%, 73% SVC sat = 73%  RA sat = 72%  Objective:   Weight Range: 64.8 kg (142 lb 13.7 oz) Body mass index is 21.72 kg/m.   Vital Signs:   Temp:  [97.3 F (36.3 C)-98.1 F (36.7 C)] 97.5 F (36.4 C) (05/27 1153) Resp:  [11-26] 20 (05/27 1300) BP: (90-141)/(58-96) 109/75 (05/27 1300) SpO2:  [85 %-99 %] 85 % (05/27 1300) Weight:  [64.8 kg (142 lb 13.7 oz)] 64.8 kg (142 lb 13.7 oz) (05/27 0500) Last BM Date: 05/17/18  Weight change: Filed Weights   05/18/18 0535 05/19/18 0500 05/20/18 0500  Weight: 66.8 kg (147 lb 4.3 oz) 66 kg (145 lb 8.1 oz) 64.8 kg (142 lb 13.7 oz)    Intake/Output:   Intake/Output Summary (Last 24 hours) at 05/20/2018 1701 Last data filed at 05/20/2018 1145 Gross per 24 hour  Intake 400 ml  Output 1726 ml  Net -1326 ml      Physical Exam    General:  Sitting up in bed  No resp difficulty HEENT: normal Neck: supple. JVP 8-9 + prominent CV waves Carotids 2+ bilat; no bruits. No lymphadenopathy or thryomegaly appreciated. Cor: PMI nondisplaced. Irregular rate & rhythm. Mechanical s2 Lungs: clear but decreased throughout  Abdomen: soft, nontender, nondistended. No hepatosplenomegaly. No bruits or masses. Good bowel sounds. Extremities: no cyanosis, clubbing, rash, trace 1+ edema +UNNA boots  RUE sling  Neuro: alert & orientedx3, cranial nerves grossly intact. moves  all 4 extremities w/o difficulty. Affect pleasant   Telemetry   Afib 70-80ss, personally reviewed  EKG    No new tracings.    Labs    CBC Recent Labs    05/19/18 0325 05/20/18 0223  WBC 4.6 5.4  HGB 12.6* 12.1*  HCT 39.1 37.6*  MCV 84.8 83.0  PLT 175 814   Basic Metabolic Panel Recent Labs    05/19/18 0325 05/20/18 0223  NA 127* 130*  K 4.0 3.8   CL 91* 92*  CO2 25 26  GLUCOSE 91 107*  BUN 26* 26*  CREATININE 1.41* 1.36*  CALCIUM 8.8* 9.0   Liver Function Tests Recent Labs    05/18/18 0234  AST 39  ALT 13*  ALKPHOS 69  BILITOT 2.5*  PROT 7.3  ALBUMIN 2.9*   No results for input(s): LIPASE, AMYLASE in the last 72 hours. Cardiac Enzymes No results for input(s): CKTOTAL, CKMB, CKMBINDEX, TROPONINI in the last 72 hours.  BNP: BNP (last 3 results) Recent Labs    01/09/18 1705 05/14/18 1310  BNP 668.0* 1,247.0*    ProBNP (last 3 results) No results for input(s): PROBNP in the last 8760 hours.   D-Dimer No results for input(s): DDIMER in the last 72 hours. Hemoglobin A1C No results for input(s): HGBA1C in the last 72 hours. Fasting Lipid Panel No results for input(s): CHOL, HDL, LDLCALC, TRIG, CHOLHDL, LDLDIRECT in the last 72 hours. Thyroid Function Tests No results for input(s): TSH, T4TOTAL, T3FREE, THYROIDAB in the last 72 hours.  Invalid input(s): FREET3  Other results:   Imaging    No results found.   Medications:     Scheduled Medications: . aspirin  81 mg Oral BH-q7a  . Chlorhexidine Gluconate Cloth  6 each Topical Daily  . furosemide  80 mg Intravenous BID  . lidocaine  1 patch Transdermal Q24H  . loratadine  10 mg Oral Daily  . macitentan  10 mg Oral Daily  . midodrine  10 mg Oral TID WC  . sodium chloride flush  3 mL Intravenous Q12H  . spironolactone  12.5 mg Oral Daily  . tadalafil  40 mg Oral Daily  . warfarin  2.5 mg Oral ONCE-1800  . Warfarin - Pharmacist Dosing Inpatient   Does not apply q1800    Infusions: . sodium chloride Stopped (05/17/18 1834)  . 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 Also has COPD (quit smoking in 2002 - 1 ppd x 25 years), HL, HTN   Directly admitted 05/14/18 with concerns for low output CHF.   Assessment/Plan   1. Pulmonary hypertension with cor pulmonale - Echo with bubble study 05/16/18 LVEF 55-60% with "late bubbles" , Mechanical AVR stable with trivial perivalvular regurg, Mild MR, Severe LAE, Severe RV dilation and reduced function. No PFO. Mod TR, PA peak pressure 68 mm Hg - RHC 1/19 with mod/severe pulm HTN with RV failure.   - RHC 05/14/18 with Mild/Moderate PAH in setting of high output with no evidence of intracardiac shunting. Luiz Blare #s 5/24 increasing PAH now with elevated left-sided pressures and normal output - Ab u/s with cirrhosis but no evidence  of portal HTN - Case reviewed again today. His hemodynamics are quite variable. Initial RHC with severe PAH and low output. Cath earlier this week with moderate PAH and high output (normal PVR). Swan numbers 5/24 with worsening left-sided pressures. Unifying etiology remains unclear suspect it is a combination of cirrhosis and peripheral shunt physiology. - Volume status continues to improve. Weight down 24  pounds total. Will continue IV diuresis 1 more days then switch to po torsemide - Continue adcirca 40 mg daily.  - Continue macitentan 10 mg daily.  - Continue midodrine 10 tid  - Auto-immune serologies re-sent but remain negative - ? HHT/shunt/AVMs with late bubbles on bubble study.   2. Chronic respiratory failure - [place] CT 05/16/18 with "dependent basilar predominant patchy subpleural reticulation and ground-glass attenuation with the suggestion of minimal associated traction bronchiolectasis. No frank honeycombing. Findings may indicate an interstitial lung disease such as early usual interstitial pneumonia (UIP) or fibrotic phase nonspecific interstitial  pneumonia (NSIP)." - Pulmonary following.    3. Chronic AFL - Rate controlled on lopressor.  - Continue coumadin with mechanical AVR.  - INR 2.62. Discussed dosing with PharmD personally.   4. CAD - s/p CABG.  - No s/s ischemia   - continue ASA. Will need statin   5. S/p mechanical AVR - Stable on most recent echo. Continue coumadin/ASA 81 - Aware of need for SBE prophylaxis   6. Leukocytoclastic vasculitis - f/u with Rheumatology - No change to current plan.    7. Cirrhosis - US Abdomen RUQ 05/16/18 - s/p cholecystectomy. + hepatic cirrhosis. Mild ascites.  - Liver Doppler 05/16/18 - No hepatic, splenic, or portal venous thrombosis or occlusion. Mild ascites. - Continue midodrine 10 tid  8. R Shoulder pain - ? Rotator cuff tear. Ortho has seen  9. Fever  - ? Line infection.  - Bcx now 1/2 from 5/24 with enterococcus . Cx 5/26 remain NGTS - Abx changed to ampicillin - Concern for prosthetic valve infection. - Plan TEE tomorrow. NPO after MN.   Can go to SDU.    Length of Stay: [place], MD  05/20/2018, 5:01 PM Advanced Heart Failure Team Pager [phone] (M-F; 7a - 4p)  Please contact [facility] for night-coverage after hours (4p -7a ) and weekends on [email or web address]

## 2018-05-20 NOTE — Evaluation (Signed)
Occupational Therapy Evaluation Patient Details Name: Lance Hicks MRN: [id number] DOB: [birth year] Today's Date: 05/20/2018    History of Present Illness 72 y.o. admitted after feeling weak with no energy for days, low BP, SOB with minimal exertion. Patient also had 2 syncopal events while tending his horses, and recently sustained a R shoulder injury with orthopedics following. PMH includes: permanent AF, CAD, aortic stenosis, CABG, aortic valve replacement, wrist surgery, foot surgery,    Clinical Impression   PTA, pt was living alone and was independent. Pt presenting with decreased ROM of right shoulder and decreased balance. Pt requiring Mod A for UB ADLs and Min Guard A for LB ADLs. Educating pt on donning/doffing sling and correct positioning. Providing education on UB bathing, toileting, and sling management. Pt would benefit from further acute OT to address UB dressing and sling management to increase safety and independence before transitioning home. Recommend dc home once medically stable with follow up from OP OT for RUE shoulder injury.    Follow Up Recommendations  Outpatient OT;Supervision - Intermittent(For RUE ROM and excercises)    Equipment Recommendations  None recommended by OT    Recommendations for Other Services       Precautions / Restrictions Precautions Precautions: Fall Precaution Comments: Conservative protocal ROM for RUE and during ADLs.  Required Braces or Orthoses: Sling Restrictions Weight Bearing Restrictions: No      Mobility Bed Mobility Overal bed mobility: Modified Independent                Transfers Overall transfer level: Modified independent Equipment used: None                  Balance Overall balance assessment: Mild deficits observed, not formally tested                                         ADL either performed or assessed with clinical judgement   ADL Overall ADL's : Needs  assistance/impaired Eating/Feeding: Set up;Sitting   Grooming: Set up;Sitting   Upper Body Bathing: Minimal assistance;Sitting Upper Body Bathing Details (indicate cue type and reason): Providing pt with education on compensatory techniques for UB bathing.  Lower Body Bathing: Min guard;Sit to/from stand   Upper Body Dressing : Moderate assistance;Sitting Upper Body Dressing Details (indicate cue type and reason): Mod A to don sling. Educating pt on donning/diffing sling by himself. Pt will need further practice. Pt will require education on donning shirt Lower Body Dressing: Min guard;Sit to/from stand   Toilet Transfer: Min guard;Ambulation     Toileting - Clothing Manipulation Details (indicate cue type and reason): Educating pt on using LUE for toilet hygiene to prevent right shoulder movement     Functional mobility during ADLs: Min guard General ADL Comments: Pt presenitng with slight balance deficits. Pt with decreased functional performance and limited ROM of RUE impacting UB ADLs. Providing pt with education on UB bathing, toileting, and sling management. Pt will need further education on UB dressing.     Vision Baseline Vision/History: Wears glasses Patient Visual Report: No change from baseline       Perception     Praxis      Pertinent Vitals/Pain Pain Assessment: 0-10 Pain Score: 2  Pain Location: R shoulder Pain Descriptors / Indicators: Discomfort     Hand Dominance Right   Extremity/Trunk Assessment Upper Extremity Assessment Upper Extremity Assessment: RUE deficits/detail  RUE Deficits / Details: shoulder rotator cuff injury. Limited shoulder ROM flexion 0-50degrees. WFL for elbow, wrist, and hand RUE: Unable to fully assess due to pain RUE Coordination: decreased gross motor   Lower Extremity Assessment Lower Extremity Assessment: Overall WFL for tasks assessed   Cervical / Trunk Assessment Cervical / Trunk Assessment: Normal   Communication  Communication Communication: No difficulties   Cognition Arousal/Alertness: Awake/alert Behavior During Therapy: WFL for tasks assessed/performed Overall Cognitive Status: Within Functional Limits for tasks assessed                                     General Comments  VSS    Exercises Exercises: Other exercises Other Exercises Other Exercises: Educating pt on conservative protocal. And movements that pt should avoid. Other Exercises: Educating pt on elbow ROM.   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  OT Goal Formulation: With patient Time For Goal Achievement: 06/03/18 Potential to Achieve Goals: Good ADL Goals Pt Will Perform Upper Body Bathing: with modified independence;standing Pt Will Perform Upper Body Dressing: with modified independence;sitting(compensatory  techniques) Additional ADL Goal #1: Pt will don/doff sling independently during UB dressing  OT Frequency: Min 2X/week   Barriers to D/C:            Co-evaluation              AM-PAC PT "6 Clicks" Daily Activity     Outcome Measure Help from another person eating meals?: None Help from another person taking care of personal grooming?: None Help from another person toileting, which includes using toliet, bedpan, or urinal?: A Little Help from another person bathing (including washing, rinsing, drying)?: A Little Help from another person to put on and taking off regular upper body clothing?: A Little Help from another person to put on and taking off regular lower body clothing?: A Little 6 Click Score: 20   End of Session Equipment Utilized During Treatment: Other (comment)(Sling) Nurse Communication: Mobility status  Activity Tolerance: Patient tolerated treatment well Patient left: in bed;with call bell/phone within reach;with nursing/sitter in room  OT Visit Diagnosis: Unsteadiness on feet (R26.81);Other abnormalities of gait and mobility (R26.89);Pain Pain - Right/Left: Right Pain - part of body: Shoulder                Time: 3557-3220 OT Time Calculation (min): 41 min Charges:  OT General Charges $OT Visit: 1 Visit OT Evaluation $  OT Eval Moderate Complexity: 1 Mod OT Treatments $Self Care/Home Management : 23-37 mins G-Codes:     Brean Carberry MSOT, OTR/L Acute Rehab Pager: [phone] Office: [place] 05/20/2018, 4:22 PM

## 2018-05-20 NOTE — Progress Notes (Signed)
ANTICOAGULATION CONSULT NOTE - [place] for warfarin Indication: atrial fibrillation, mechanical AVR  Allergies  Allergen Reactions  . Amoxicillin-Pot Clavulanate Other (See Comments)    Stomach pain Has patient had a PCN reaction causing immediate rash, facial/tongue/throat swelling, SOB or lightheadedness with hypotension: No Has patient had a PCN reaction causing severe rash involving mucus membranes or skin necrosis: No Has patient had a PCN reaction that required hospitalization: No Has patient had a PCN reaction occurring within the last 10 years: Yes If all of the above answers are "NO", then may proceed with Cephalosporin use.   . Tape Rash and Other (See Comments)    Surgical tape    Patient Measurements: Height: _0  (172.7 cm) Weight: 142 lb 13.7 oz (64.8 kg) IBW/kg (Calculated) : 68.4   Vital Signs: Temp: 97.8 F (36.6 C) (05/27 0430) Temp Source: Oral (05/27 0430) BP: 90/60 (05/27 0600)  Labs: Recent Labs    05/18/18 0234 05/19/18 0325 05/20/18 0223  HGB 12.1* 12.6* 12.1*  HCT 37.7* 39.1 37.6*  PLT 179 175 190  LABPROT 25.3* 27.9* 27.8*  INR 2.33 2.63 2.62  CREATININE 1.31* 1.41* 1.36*    Estimated Creatinine Clearance: 45.7 mL/min (A) (by C-G formula based on SCr of 1.36 mg/dL (H)).   Medical History: Past Medical History:  Diagnosis Date  . 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 In light of concurrent left nephrolithiasis, will check CT renal colic next year instead of renal US.   [name] Anticoagulated on Coumadin 01/04/2018  . Anxiety 05/10/2016   Last Assessment & Plan:  Doing well off of zoloft.  . Asymptomatic microscopic hematuria 06/20/2017   Last Assessment & Plan:  Had hematuria workup in [facility] in 2016 which negative CT and cystoscopy. UA with 2+ blood last visit - we discussed recommendation for repeat workup at 5 years or if degree of hematuria progresses.    . Atrial fibrillation ([place]) 03/29/2017  . Atrial flutter ([place]) 03/29/2017  . Chronic allergic rhinitis 04/28/2016   Last Assessment & Plan:  Continue astelin  . Chronic anticoagulation 03/29/2017  . Chronic atrial fibrillation ([place]) 07/23/2015   Last Assessment & Plan:  Coumadin and metoprolol, cardiology referral to establish care.  . Chronic midline back pain 12/14/2015   Last Assessment & Plan:  Pain management referral for further evaluation.  . Chronic prostatitis 07/23/2015   Last Assessment & Plan:  Has largely resolved since stopping bike riding. Recommend annual DRE AND PSA - will see back 12/2015 for annual screening, given 1st degree fhx. To call office for recurrent prostatitis symptoms.   [name] COPD (chronic obstructive pulmonary disease) ([place]) 06/12/2016  . Coronary artery disease involving native coronary artery of native heart with angina pectoris ([place]) 03/29/2017  . Cough 10/17/2016   Last Assessment & Plan:  Discussed typical course for acute viral illness. If symptoms worsen or fail to improve by 7-10d, delayed ATBs, fluids, rest, NSAIDs/APAP prn. Seek care if not improving. Needs earlier INR check due to ATBs.  [name] Dyspnea 02/01/2016   Last Assessment & Plan:  Overall improving, eval by pulm, plan for CT, neg stress test with cardiology. Recent switch to carvedilol due to side effects.  . Epidermoid cyst of skin 08/24/2017  . Essential hypertension 12/14/2015   Last Assessment & Plan:  Hypertension control: controlled  Medications: compliant Medication Management: as noted in orders Home blood pressure monitoring recommended additionally as needed for  symptoms  The patient's care plan was reviewed and updated. Instructions and counseling were provided regarding patient goals and barriers. He was counseled to adopt a healthy lifestyle. Educational resources and self-management tools have been provided as charted in [facility] list.   . H/O maze procedure 03/29/2017  . [name]  03/29/2017   Overview:  2011  . Hx of CABG 03/29/2017  . Hyperlipidemia 03/29/2017  . Hypertensive heart disease 03/29/2017  . Kidney stones 07/23/2015   Overview:  x 3  Last Assessment & Plan:  By [place] has left nephrolithiasis, but not visible by KUB. Will check CT renal colic next year to assess both stone burden as well as to surveil AML.   . Leukocytoclastic vasculitis ([place]) 10/01/2017  . Localized edema 01/04/2018  . Lumbar radicular pain 01/19/2016  . Maculopapular rash 09/03/2017  . Nephrolithiasis 07/23/2015   Overview:  x 3  Last Assessment & Plan:  By [place] has left nephrolithiasis, but not visible by KUB. Will check CT renal colic next year to assess both stone burden as well as to surveil AML.   Overview:  x 3  Last Assessment & Plan:  Has 46m nonobstructing LUP stone - not visible by KUB.  Will check renal [place] - he will contact office sooner if symptomatic.   . Non-sustained ventricular tachycardia ([place] 03/29/2017  . Other hyperlipidemia 03/29/2017  . Palpitations 10/01/2017  . Paroxysmal atrial fibrillation ([place] 03/29/2017  . Paroxysmal atrial fibrillation ([place] 03/29/2017  . Pleural effusion, bilateral 08/09/2017  . Post-nasal drainage 02/25/2016   Last Assessment & Plan:  Trial zyrtec and flonase  . Prostate cancer screening 06/20/2017   Last Assessment & Plan:  Recommend continued annual CaP screening until within 10 years of life expectancy. Given good health and fhx of longevity, would anticipate CaP screening to continue until age 72  PSA today and again in one year on day of visit.  . Pulmonary hypertension ([place] 08/09/2017  . Pulmonary nodules 06/12/2016  . S/P AVR (aortic valve replacement) 03/20/2016  . Supratherapeutic INR 07/26/2017  . Syncope 03/29/2017  . Syncope and collapse 02/01/2016  . Typical atrial flutter ([place] 02/01/2016   Assessment: 89yoM on warfarin for AFib and mechanical AVR presents with SOB. INR subtherapeutic on admit, started on IV heparin bridge now off. INR remains  therapeutic today at 2.62.   PTA Dose = 2.567md except 78m58mat  Goal of Therapy:  (INR goal 2.5-3.5 per PCP) Monitor platelets by anticoagulation protocol: Yes   Plan:  -Warfarin 2.78mg52m x1 -Daily INR   MichArrie SenatearmD, BCPS PGY-2 Cardiology Pharmacy Resident Pager: [phone]/2019

## 2018-05-21 ENCOUNTER — Encounter (HOSPITAL_COMMUNITY): Payer: Self-pay | Admitting: *Deleted

## 2018-05-21 ENCOUNTER — Encounter (HOSPITAL_COMMUNITY)
Admission: AD | Disposition: A | Discharge status: Home / Self Care | Payer: Self-pay | Source: Ambulatory Visit | Attending: Internal Medicine

## 2018-05-21 ENCOUNTER — Ambulatory Visit: Payer: Medicare Other | Admitting: Cardiology

## 2018-05-21 ENCOUNTER — Inpatient Hospital Stay (HOSPITAL_COMMUNITY): Payer: Medicare Other

## 2018-05-21 DIAGNOSIS — S46819A Strain of other muscles, fascia and tendons at shoulder and upper arm level, unspecified arm, initial encounter: Secondary | ICD-10-CM

## 2018-05-21 DIAGNOSIS — R7881 Bacteremia: Secondary | ICD-10-CM

## 2018-05-21 DIAGNOSIS — M25519 Pain in unspecified shoulder: Secondary | ICD-10-CM

## 2018-05-21 DIAGNOSIS — J811 Chronic pulmonary edema: Secondary | ICD-10-CM

## 2018-05-21 DIAGNOSIS — B952 Enterococcus as the cause of diseases classified elsewhere: Secondary | ICD-10-CM

## 2018-05-21 DIAGNOSIS — I517 Cardiomegaly: Secondary | ICD-10-CM

## 2018-05-21 HISTORY — PX: TEE WITHOUT CARDIOVERSION: SHX5443

## 2018-05-21 LAB — BASIC METABOLIC PANEL
Anion gap: 10 (ref 5–15)
BUN: 29 mg/dL — ABNORMAL HIGH (ref 6–20)
CO2: 28 mmol/L (ref 22–32)
Calcium: 9 mg/dL (ref 8.9–10.3)
Chloride: 94 mmol/L — ABNORMAL LOW (ref 101–111)
Creatinine, Ser: 1.42 mg/dL — ABNORMAL HIGH (ref 0.61–1.24)
GFR calc Af Amer: 56 mL/min — ABNORMAL LOW (ref >60)
GFR calc non Af Amer: 48 mL/min — ABNORMAL LOW (ref >60)
Glucose, Bld: 101 mg/dL — ABNORMAL HIGH (ref 65–99)
Potassium: 3.7 mmol/L (ref 3.5–5.1)
Sodium: 132 mmol/L — ABNORMAL LOW (ref 135–145)

## 2018-05-21 LAB — CBC
HCT: 37.1 % — ABNORMAL LOW (ref 39.0–52.0)
Hemoglobin: 12.1 g/dL — ABNORMAL LOW (ref 13.0–17.0)
MCH: 27.1 pg (ref 26.0–34.0)
MCHC: 32.6 g/dL (ref 30.0–36.0)
MCV: 83 fL (ref 78.0–100.0)
Platelets: 214 10*3/uL (ref 150–400)
RBC: 4.47 MIL/uL (ref 4.22–5.81)
RDW: 16.9 % — ABNORMAL HIGH (ref 11.5–15.5)
WBC: 5.1 10*3/uL (ref 4.0–10.5)

## 2018-05-21 LAB — CULTURE, BLOOD (ROUTINE X 2)

## 2018-05-21 LAB — GLUCOSE, CAPILLARY: Glucose-Capillary: 107 mg/dL — ABNORMAL HIGH (ref 65–99)

## 2018-05-21 LAB — PROTIME-INR
INR: 2.71
Prothrombin Time: 28.6 seconds — ABNORMAL HIGH (ref 11.4–15.2)

## 2018-05-21 SURGERY — ECHOCARDIOGRAM, TRANSESOPHAGEAL
Anesthesia: Moderate Sedation

## 2018-05-21 MED ORDER — MIDAZOLAM HCL 10 MG/2ML IJ SOLN
INTRAMUSCULAR | Status: DC | PRN
  Administered 2018-05-21: 2 mg via INTRAVENOUS
  Administered 2018-05-21: 1 mg via INTRAVENOUS

## 2018-05-21 MED ORDER — TORSEMIDE 20 MG PO TABS
60.0000 mg | ORAL_TABLET | Freq: Every day | ORAL | Status: DC
  Administered 2018-05-21 – 2018-05-23 (×3): 60 mg via ORAL
  Filled 2018-05-21 (×3): qty 3

## 2018-05-21 MED ORDER — MIDAZOLAM HCL 5 MG/ML IJ SOLN
INTRAMUSCULAR | Status: AC
  Filled 2018-05-21: qty 2

## 2018-05-21 MED ORDER — FENTANYL CITRATE (PF) 100 MCG/2ML IJ SOLN
INTRAMUSCULAR | Status: DC | PRN
  Administered 2018-05-21 (×2): 25 ug via INTRAVENOUS

## 2018-05-21 MED ORDER — DIPHENHYDRAMINE HCL 50 MG/ML IJ SOLN
INTRAMUSCULAR | Status: AC
  Filled 2018-05-21: qty 1

## 2018-05-21 MED ORDER — WARFARIN SODIUM 2.5 MG PO TABS
2.5000 mg | ORAL_TABLET | Freq: Once | ORAL | Status: AC
  Administered 2018-05-21: 2.5 mg via ORAL
  Filled 2018-05-21: qty 1

## 2018-05-21 MED ORDER — BUTAMBEN-TETRACAINE-BENZOCAINE 2-2-14 % EX AERO
INHALATION_SPRAY | CUTANEOUS | Status: DC | PRN
  Administered 2018-05-21: 2 via TOPICAL

## 2018-05-21 MED ORDER — FENTANYL CITRATE (PF) 100 MCG/2ML IJ SOLN
INTRAMUSCULAR | Status: AC
  Filled 2018-05-21: qty 2

## 2018-05-21 NOTE — Progress Notes (Signed)
ANTICOAGULATION CONSULT NOTE - [place] for warfarin Indication: atrial fibrillation, mechanical AVR  Allergies  Allergen Reactions  . Amoxicillin-Pot Clavulanate Diarrhea    Stomach pain Has patient had a PCN reaction causing immediate rash, facial/tongue/throat swelling, SOB or lightheadedness with hypotension: No Has patient had a PCN reaction causing severe rash involving mucus membranes or skin necrosis: No Has patient had a PCN reaction that required hospitalization: No Has patient had a PCN reaction occurring within the last 10 years: Yes If all of the above answers are "NO", then may proceed with Cephalosporin use.   . Tape Rash and Other (See Comments)    Surgical tape    Patient Measurements: Height: _0  (172.7 cm) Weight: 141 lb 12.8 oz (64.3 kg) IBW/kg (Calculated) : 68.4   Vital Signs: Temp: 97.9 F (36.6 C) (05/27 2340) Temp Source: Oral (05/27 2340) BP: 99/70 (05/28 0600)  Labs: Recent Labs    05/19/18 0325 05/20/18 0223 05/21/18 0315  HGB 12.6* 12.1* 12.1*  HCT 39.1 37.6* 37.1*  PLT 175 190 214  LABPROT 27.9* 27.8* 28.6*  INR 2.63 2.62 2.71  CREATININE 1.41* 1.36* 1.42*    Estimated Creatinine Clearance: 43.4 mL/min (A) (by C-G formula based on SCr of 1.42 mg/dL (H)).   Medical History: Past Medical History:  Diagnosis Date  . 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 In light of concurrent left nephrolithiasis, will check CT renal colic next year instead of renal US.   [name] Anticoagulated on Coumadin 01/04/2018  . Anxiety 05/10/2016   Last Assessment & Plan:  Doing well off of zoloft.  . Asymptomatic microscopic hematuria 06/20/2017   Last Assessment & Plan:  Had hematuria workup in [facility] in 2016 which negative CT and cystoscopy. UA with 2+ blood last visit - we discussed recommendation for repeat workup at 5 years or if degree of hematuria progresses.   . Atrial  fibrillation ([place]) 03/29/2017  . Atrial flutter ([place]) 03/29/2017  . Chronic allergic rhinitis 04/28/2016   Last Assessment & Plan:  Continue astelin  . Chronic anticoagulation 03/29/2017  . Chronic atrial fibrillation ([place]) 07/23/2015   Last Assessment & Plan:  Coumadin and metoprolol, cardiology referral to establish care.  . Chronic midline back pain 12/14/2015   Last Assessment & Plan:  Pain management referral for further evaluation.  . Chronic prostatitis 07/23/2015   Last Assessment & Plan:  Has largely resolved since stopping bike riding. Recommend annual DRE AND PSA - will see back 12/2015 for annual screening, given 1st degree fhx. To call office for recurrent prostatitis symptoms.   [name] COPD (chronic obstructive pulmonary disease) ([place]) 06/12/2016  . Coronary artery disease involving native coronary artery of native heart with angina pectoris ([place]) 03/29/2017  . Cough 10/17/2016   Last Assessment & Plan:  Discussed typical course for acute viral illness. If symptoms worsen or fail to improve by 7-10d, delayed ATBs, fluids, rest, NSAIDs/APAP prn. Seek care if not improving. Needs earlier INR check due to ATBs.  [name] Dyspnea 02/01/2016   Last Assessment & Plan:  Overall improving, eval by pulm, plan for CT, neg stress test with cardiology. Recent switch to carvedilol due to side effects.  . Epidermoid cyst of skin 08/24/2017  . Essential hypertension 12/14/2015   Last Assessment & Plan:  Hypertension control: controlled  Medications: compliant Medication Management: as noted in orders Home blood pressure monitoring recommended additionally as needed for symptoms  The patient's care plan was reviewed and updated. Instructions and counseling were provided regarding patient goals and barriers. He was counseled to adopt a healthy lifestyle. Educational resources and self-management tools have been provided as charted in [facility] list.   . H/O maze procedure 03/29/2017  . [name] 03/29/2017    Overview:  2011  . Hx of CABG 03/29/2017  . Hyperlipidemia 03/29/2017  . Hypertensive heart disease 03/29/2017  . Kidney stones 07/23/2015   Overview:  x 3  Last Assessment & Plan:  By [place] has left nephrolithiasis, but not visible by KUB. Will check CT renal colic next year to assess both stone burden as well as to surveil AML.   . Leukocytoclastic vasculitis ([place]) 10/01/2017  . Localized edema 01/04/2018  . Lumbar radicular pain 01/19/2016  . Maculopapular rash 09/03/2017  . Nephrolithiasis 07/23/2015   Overview:  x 3  Last Assessment & Plan:  By [place] has left nephrolithiasis, but not visible by KUB. Will check CT renal colic next year to assess both stone burden as well as to surveil AML.   Overview:  x 3  Last Assessment & Plan:  Has 53m nonobstructing LUP stone - not visible by KUB.  Will check renal [place] - he will contact office sooner if symptomatic.   . Non-sustained ventricular tachycardia ([place] 03/29/2017  . Other hyperlipidemia 03/29/2017  . Palpitations 10/01/2017  . Paroxysmal atrial fibrillation ([place] 03/29/2017  . Paroxysmal atrial fibrillation ([place] 03/29/2017  . Pleural effusion, bilateral 08/09/2017  . Post-nasal drainage 02/25/2016   Last Assessment & Plan:  Trial zyrtec and flonase  . Prostate cancer screening 06/20/2017   Last Assessment & Plan:  Recommend continued annual CaP screening until within 10 years of life expectancy. Given good health and fhx of longevity, would anticipate CaP screening to continue until age 72  PSA today and again in one year on day of visit.  . Pulmonary hypertension ([place] 08/09/2017  . Pulmonary nodules 06/12/2016  . S/P AVR (aortic valve replacement) 03/20/2016  . Supratherapeutic INR 07/26/2017  . Syncope 03/29/2017  . Syncope and collapse 02/01/2016  . Typical atrial flutter ([place] 02/01/2016   Assessment: 89yoM on warfarin for AFib and mechanical AVR presents with SOB. INR subtherapeutic on admit, started on IV heparin bridge now off. INR remains therapeutic  today at 2.71 after resuming home regimen.   PTA Dose = 2.568md except 32m40mat  Goal of Therapy:  (INR goal 2.5-3.5 per PCP) Monitor platelets by anticoagulation protocol: Yes   Plan:  -Warfarin 2.32mg83m x1 -Daily INR   MichArrie SenatearmD, BCPS PGY-2 Cardiology Pharmacy Resident Pager: [phone]

## 2018-05-21 NOTE — Progress Notes (Signed)
Occupational Therapy Treatment Patient Details Name: Lance Hicks MRN: [id number] DOB: May 29, 1946 Today's Date: 05/21/2018    History of present illness 72 y.o. admitted after feeling weak with no energy for days, low BP, SOB with minimal exertion. Patient also had 2 syncopal events while tending his horses, and recently sustained a R shoulder injury with orthopedics following. PMH includes: permanent AF, CAD, aortic stenosis, CABG, aortic valve replacement, wrist surgery, foot surgery,    OT comments  Pt progressing towards established OT goals. Focused session on UB dressing and sling management while maintaining conservative protocol at R shoulder. Pt donning/doffing shirt and sling with supervision and Min VCs. Continue to recommend dc home once medically stable per physician with follow up at OP OT. Will continue to follow acutely as admitted.   Follow Up Recommendations  Outpatient OT;Supervision - Intermittent(For RUE ROM and excercises)    Equipment Recommendations  None recommended by OT    Recommendations for Other Services      Precautions / Restrictions Precautions Precautions: Fall Precaution Comments: Conservative protocal ROM for RUE and during ADLs.  Required Braces or Orthoses: Sling Restrictions Weight Bearing Restrictions: No       Mobility Bed Mobility Overal bed mobility: Modified Independent                Transfers Overall transfer level: Modified independent Equipment used: None             General transfer comment: no difficulties    Balance Overall balance assessment: Mild deficits observed, not formally tested                                         ADL either performed or assessed with clinical judgement   ADL Overall ADL's : Needs assistance/impaired                 Upper Body Dressing : Supervision/safety;Standing Upper Body Dressing Details (indicate cue type and reason): Educating pt on  compensatory techniques for UB dressing. Pt donning/doffing shirt with MIn cues for technique. Providing increased instruction for sling management to assist pt in becoming independent for return home. Pt donning/doffing sling with Min cues for positioning.     Toilet Transfer: Modified Independent           Functional mobility during ADLs: Supervision/safety General ADL Comments: Focused session on UB dressing and sling management. At end of session, pt able to perform tasks with MIn cues and supervision     Vision       Perception     Praxis      Cognition Arousal/Alertness: Awake/alert Behavior During Therapy: WFL for tasks assessed/performed Overall Cognitive Status: Within Functional Limits for tasks assessed                                          Exercises Exercises: Other exercises Other Exercises Other Exercises: Educating pt on conservative protocal. And movements that pt should avoid. Other Exercises: Educating pt on elbow ROM.   Shoulder Instructions       General Comments VSS    Pertinent Vitals/ Pain       Pain Assessment: Faces Pain Score: 5  Faces Pain Scale: Hurts even more Pain Location: R shoulder Pain Descriptors / Indicators: Discomfort Pain Intervention(s): Monitored during session;Repositioned  Home Living                                          Prior Functioning/Environment              Frequency  Min 2X/week        Progress Toward Goals  OT Goals(current goals can now be found in the care plan section)  Progress towards OT goals: Progressing toward goals  Acute Rehab OT Goals Patient Stated Goal: get bp undercontrol OT Goal Formulation: With patient Time For Goal Achievement: 06/03/18 Potential to Achieve Goals: Good ADL Goals Pt Will Perform Upper Body Bathing: with modified independence;standing Pt Will Perform Upper Body Dressing: with modified independence;sitting(compensatory  techniques) Additional ADL Goal #1: Pt will don/doff sling independently during UB dressing  Plan Discharge plan remains appropriate    Co-evaluation                 AM-PAC PT "6 Clicks" Daily Activity     Outcome Measure   Help from another person eating meals?: None Help from another person taking care of personal grooming?: None Help from another person toileting, which includes using toliet, bedpan, or urinal?: A Little Help from another person bathing (including washing, rinsing, drying)?: A Little Help from another person to put on and taking off regular upper body clothing?: A Little Help from another person to put on and taking off regular lower body clothing?: A Little 6 Click Score: 20    End of Session Equipment Utilized During Treatment: Other (comment)(Sling)  OT Visit Diagnosis: Unsteadiness on feet (R26.81);Other abnormalities of gait and mobility (R26.89);Pain Pain - Right/Left: Right Pain - part of body: Shoulder   Activity Tolerance Patient tolerated treatment well   Patient Left in bed;with call bell/phone within reach;with nursing/sitter in room   Nurse Communication Mobility status        Time: 5146-0479 OT Time Calculation (min): 29 min  Charges: OT General Charges $OT Visit: 1 Visit OT Treatments $Self Care/Home Management : 23-37 mins  [place], OTR/L Acute Rehab Pager: [phone] Office: [place] 05/21/2018, 5:33 PM

## 2018-05-21 NOTE — Progress Notes (Signed)
Patient returned to room via wheelchair from ENDO alert and oriented x4.  Right UE in sling.  NAD noted.  Wearing metal frame glasses and requesting "something to eat."  Also requests "pain medication for my arm."  Pointing to right upper arm.  Will medicate per Mar for pain.  Given vanilla ice cream and graham crackers with ice water and ginger ale.  Non productive intermittent cough present.

## 2018-05-21 NOTE — Progress Notes (Signed)
  Echocardiogram Echocardiogram Transesophageal has been performed.  Nero Sawatzky T Orman Matsumura 05/21/2018, 8:57 AM

## 2018-05-21 NOTE — Progress Notes (Signed)
[facility] for Infectious Disease  Date of Admission:  05/14/2018     Total days of antibiotics 5         ASSESSMENT/PLAN  Lance Hicks is a 72 y/o male with found to have enterococcal bacteremia in 1/4 blood cultures most likely related to [name] catheter which was removed. Repeat cultures from 5/26 remain in process. TEE negative for endocarditis. He has remained afebrile with no leukocytosis. Currently on Day 5 of antimicrobial therapy with ampicillin (Day 2) and tolerating antibiotics with no adverse side effects.   1. Continue ampicillin while in the hospital.  2. Monitor blood cultures. 3. Pending discharge recommend change to linezolid for total treatment therapy of 14 days.   Active Problems:   Pulmonary hypertension ([place])   . aspirin  81 mg Oral BH-q7a  . lidocaine  1 patch Transdermal Q24H  . loratadine  10 mg Oral Daily  . macitentan  10 mg Oral Daily  . midodrine  10 mg Oral TID WC  . sodium chloride flush  3 mL Intravenous Q12H  . spironolactone  12.5 mg Oral Daily  . tadalafil  40 mg Oral Daily  . torsemide  60 mg Oral Daily  . warfarin  2.5 mg Oral ONCE-1800  . Warfarin - Pharmacist Dosing Inpatient   Does not apply q1800    SUBJECTIVE:  Afebrile overnight with no leukocytosis. TEE completed with no evidence of endocarditis. Repeat blood cultures from 5/26 remain in process.   Allergies  Allergen Reactions  . Amoxicillin-Pot Clavulanate Diarrhea    Stomach pain Has patient had a PCN reaction causing immediate rash, facial/tongue/throat swelling, SOB or lightheadedness with hypotension: No Has patient had a PCN reaction causing severe rash involving mucus membranes or skin necrosis: No Has patient had a PCN reaction that required hospitalization: No Has patient had a PCN reaction occurring within the last 10 years: Yes If all of the above answers are "NO", then may proceed with Cephalosporin use.   . Tape Rash and Other (See Comments)   Surgical tape     Review of Systems: Review of Systems  Constitutional: Negative for chills and fever.  Respiratory: Negative for cough, shortness of breath and wheezing.   Cardiovascular: Negative for chest pain, palpitations and leg swelling.  Gastrointestinal: Negative for constipation, diarrhea, nausea and vomiting.  Skin: Negative for rash.      OBJECTIVE: Vitals:   05/21/18 0855 05/21/18 1010 05/21/18 1200 05/21/18 1255  BP: (!) _0   Pulse: 65     Resp: _1 Temp:    98.4 F (36.9 C)  TempSrc:    Oral  SpO2: 94% 98% 98%   Weight:      Height:       Body mass index is 21.56 kg/m.  Physical Exam  Constitutional: He is oriented to person, place, and time. He appears well-developed and well-nourished. No distress.  Cardiovascular: Normal rate, regular rhythm and intact distal pulses.  Murmur heard. Pulmonary/Chest: Effort normal and breath sounds normal. No stridor. No respiratory distress. He has no wheezes. He has no rales. He exhibits no tenderness.  Abdominal: Soft. Bowel sounds are normal. He exhibits no distension. There is no tenderness.  Neurological: He is alert and oriented to person, place, and time.  Skin: Skin is warm and dry. No rash noted.  Psychiatric: He has a normal mood and affect. His behavior is normal. Judgment and thought content normal.    Lab Results  Lab Results  Component Value Date   WBC 5.1 05/21/2018   HGB 12.1 (L) 05/21/2018   HCT 37.1 (L) 05/21/2018   MCV 83.0 05/21/2018   PLT 214 05/21/2018    Lab Results  Component Value Date   CREATININE 1.42 (H) 05/21/2018   BUN 29 (H) 05/21/2018   NA 132 (L) 05/21/2018   K 3.7 05/21/2018   CL 94 (L) 05/21/2018   CO2 28 05/21/2018    Lab Results  Component Value Date   ALT 13 (L) 05/18/2018   AST 39 05/18/2018   ALKPHOS 69 05/18/2018   BILITOT 2.5 (H) 05/18/2018     Microbiology: Recent Results (from the past 240 hour(s))  Surgical pcr screen     Status:  None   Collection Time: 05/16/18  9:32 AM  Result Value Ref Range Status   MRSA, PCR NEGATIVE NEGATIVE Final   Staphylococcus aureus NEGATIVE NEGATIVE Final    Comment: (NOTE) The Xpert SA Assay (FDA approved for NASAL specimens in patients 66 years of age and older), is one component of a comprehensive surveillance program. It is not intended to diagnose infection nor to guide or monitor treatment. Performed at [facility], [place] [address], [place], [place] [address]   Culture, blood (routine x 2)     Status: None (Preliminary result)   Collection Time: 05/17/18  6:35 PM  Result Value Ref Range Status   Specimen Description BLOOD BLOOD RIGHT WRIST  Final   Special Requests   Final    AEROBIC BOTTLE ONLY Blood Culture results may not be optimal due to an inadequate volume of blood received in culture bottles   Culture   Final    NO GROWTH 3 DAYS Performed at [facility], [place] [address] [place], [place] [address]    Report Status PENDING  Incomplete  Culture, blood (routine x 2)     Status: Abnormal   Collection Time: 05/17/18  6:51 PM  Result Value Ref Range Status   Specimen Description BLOOD RIGHT HAND  Final   Special Requests   Final    BOTTLES DRAWN AEROBIC ONLY Blood Culture results may not be optimal due to an inadequate volume of blood received in culture bottles   Culture  Setup Time   Final    GRAM POSITIVE COCCI IN PAIRS AEROBIC BOTTLE ONLY CRITICAL RESULT CALLED TO, READ BACK BY AND VERIFIED WITH: TRUDISILL,PHARMD _0  05/19/18 BY LHOWARD Performed at [facility], [place] [address], [place], [place] [address]    Culture ENTEROCOCCUS FAECALIS (A)  Final   Report Status 05/21/2018 FINAL  Final   Organism ID, Bacteria ENTEROCOCCUS FAECALIS  Final      Susceptibility   Enterococcus faecalis - MIC*    AMPICILLIN <=2 SENSITIVE Sensitive     VANCOMYCIN <=0.5 SENSITIVE Sensitive     GENTAMICIN SYNERGY RESISTANT Resistant     * ENTEROCOCCUS FAECALIS    Blood Culture ID Panel (Reflexed)     Status: Abnormal   Collection Time: 05/17/18  6:51 PM  Result Value Ref Range Status   Enterococcus species DETECTED (A) NOT DETECTED Final    Comment: CRITICAL RESULT CALLED TO, READ BACK BY AND VERIFIED WITH: TRUDISILL,PHARMD _1  05/19/18 BY LHOWARD    Vancomycin resistance NOT DETECTED NOT DETECTED Final   Listeria monocytogenes NOT DETECTED NOT DETECTED Final   Staphylococcus species NOT DETECTED NOT DETECTED Final   Staphylococcus aureus NOT DETECTED NOT DETECTED Final   Streptococcus species NOT DETECTED  NOT DETECTED Final   Streptococcus agalactiae NOT DETECTED NOT DETECTED Final   Streptococcus pneumoniae NOT DETECTED NOT DETECTED Final   Streptococcus pyogenes NOT DETECTED NOT DETECTED Final   Acinetobacter baumannii NOT DETECTED NOT DETECTED Final   Enterobacteriaceae species NOT DETECTED NOT DETECTED Final   Enterobacter cloacae complex NOT DETECTED NOT DETECTED Final   Escherichia coli NOT DETECTED NOT DETECTED Final   Klebsiella oxytoca NOT DETECTED NOT DETECTED Final   Klebsiella pneumoniae NOT DETECTED NOT DETECTED Final   Proteus species NOT DETECTED NOT DETECTED Final   Serratia marcescens NOT DETECTED NOT DETECTED Final   Haemophilus influenzae NOT DETECTED NOT DETECTED Final   Neisseria meningitidis NOT DETECTED NOT DETECTED Final   Pseudomonas aeruginosa NOT DETECTED NOT DETECTED Final   Candida albicans NOT DETECTED NOT DETECTED Final   Candida glabrata NOT DETECTED NOT DETECTED Final   Candida krusei NOT DETECTED NOT DETECTED Final   Candida parapsilosis NOT DETECTED NOT DETECTED Final   Candida tropicalis NOT DETECTED NOT DETECTED Final    Comment: Performed at [facility], [place] [address], [place],  [address]     Terri Piedra, [place] [facility] [phone] Pager  05/21/2018  1:36 PM

## 2018-05-21 NOTE — Progress Notes (Signed)
Physical Therapy Treatment Patient Details Name: Lance Hicks MRN: [id number] DOB: August 31, 1946 Today's Date: 05/21/2018    History of Present Illness 72 y.o. admitted after feeling weak with no energy for days, low BP, SOB with minimal exertion. Patient also had 2 syncopal events while tending his horses, and recently sustained a R shoulder injury with orthopedics following. PMH includes: permanent AF, CAD, aortic stenosis, CABG, aortic valve replacement, wrist surgery, foot surgery,     PT Comments    Pt functioning near baseline. OT managing R shoulder protocol. Pt able to ambulate outside and navigate uneven terrain. Pt freq dec to 2x/wk.    Follow Up Recommendations  Outpatient PT     Equipment Recommendations  None recommended by PT    Recommendations for Other Services       Precautions / Restrictions Precautions Precautions: Fall Required Braces or Orthoses: Sling Restrictions Weight Bearing Restrictions: No    Mobility  Bed Mobility Overal bed mobility: Modified Independent                Transfers Overall transfer level: Modified independent Equipment used: None             General transfer comment: no difficulties  Ambulation/Gait Ambulation/Gait assistance: Supervision Ambulation Distance (Feet): 1000 Feet Assistive device: None Gait Pattern/deviations: WFL(Within Functional Limits) Gait velocity: decreased Gait velocity interpretation: 1.31 - 2.62 ft/sec, indicative of limited community ambulator General Gait Details: SpO2 >92% on RA. Pt with no episodes of LOB. went outside and was able to navigate uneven terrain without LOB or difficulty   Stairs             Wheelchair Mobility    Modified Rankin (Stroke Patients Only)       Balance Overall balance assessment: Mild deficits observed, not formally tested                                          Cognition Arousal/Alertness: Awake/alert Behavior During  Therapy: WFL for tasks assessed/performed Overall Cognitive Status: Within Functional Limits for tasks assessed                                        Exercises      General Comments General comments (skin integrity, edema, etc.): VSS      Pertinent Vitals/Pain Pain Assessment: 0-10 Pain Score: 5  Pain Location: R shoulder Pain Descriptors / Indicators: Discomfort Pain Intervention(s): Limited activity within patient's tolerance    Home Living                      Prior Function            PT Goals (current goals can now be found in the care plan section) Acute Rehab PT Goals Patient Stated Goal: get bp undercontrol    Frequency    Min 2X/week      PT Plan Frequency needs to be updated    Co-evaluation              AM-PAC PT "6 Clicks" Daily Activity  Outcome Measure  Difficulty turning over in bed (including adjusting bedclothes, sheets and blankets)?: None Difficulty moving from lying on back to sitting on the side of the bed? : None Difficulty sitting down on and standing up from  a chair with arms (e.g., wheelchair, bedside commode, etc,.)?: None Help needed moving to and from a bed to chair (including a wheelchair)?: A Little Help needed walking in hospital room?: A Little Help needed climbing 3-5 steps with a railing? : A Little 6 Click Score: 21    End of Session Equipment Utilized During Treatment: Gait belt Activity Tolerance: Patient tolerated treatment well Patient left: in bed;with call bell/phone within reach Nurse Communication: Mobility status PT Visit Diagnosis: Unsteadiness on feet (R26.81);Pain Pain - Right/Left: Right Pain - part of body: Shoulder     Time: 7262-0355 PT Time Calculation (min) (ACUTE ONLY): 21 min  Charges:  $Gait Training: 8-22 mins                    G Codes:       Kittie Plater, PT, DPT Pager #: [phone] Office #: [phone]    [place] 05/21/2018, 2:53 PM

## 2018-05-21 NOTE — Progress Notes (Signed)
Advanced Heart Failure Rounding Note  PCP-Cardiologist: No primary care provider on file.   Subjective:     Continues to diurese. Weight down another poundd (25 pounds total).   Feeling better but had severe leg cramps last night. Appetite improving.. No orthopnea or PND. No fevers or chills overnight.    BCx 1/3 + for enterococcus. ID has seen. Changed vanc to ampicillin.Scheduled for TEE this am  Studies:  RHC 05/14/18 showed Mild/Moderate PAH in setting of high output with no evidence of intracardiac shunting. Hemodynamics as below.   High-Res Chest CT 05/16/18 1. Slightly irregular 0.9 cm peripheral right middle lobe solid pulmonary nodule. 3 month follow up recommended.  2. Dependent basilar predominant patchy subpleural reticulation and ground-glass attenuation with the suggestion of minimal associated traction bronchiolectasis. No frank honeycombing. Findings may indicate an interstitial lung disease such as early usual interstitial pneumonia (UIP) or fibrotic phase nonspecific interstitial pneumonia (NSIP). Suggest a follow-up high-resolution chest CT study in 6-12 months to assess temporal pattern stability, as clinically warranted. 3. Mild cardiomegaly. Minimal interlobular septal thickening and trace dependent bilateral pleural effusions.  4. Small to moderate volume perihepatic ascites. 5. Ectatic 4.3 cm ascending thoracic aorta. Recommend annual imaging followup by CTA or MRA.  6. Left main and 3 vessel coronary atherosclerosis.  US Abdomen RUQ 05/16/18 - s/p cholecystectomy. + hepatic cirrhosis. Mild ascites.   Liver Doppler 05/16/18 - No hepatic, splenic, or portal venous thrombosis or occlusion. Mild ascites.  Echo with bubble study 05/16/18 LVEF 55-60% with "late bubbles" , Mechanical AVR stable with trivial perivalvular regurg, Mild MR, Severe LAE, Severe RV dilation and reduced function. No PFO. Mod TR, PA peak pressure 68 mm Hg   Swan numbers PA  60-19 (32) CVP ~10 CO 5.4 CI 2.9  RHC 05/14/18 RA = 18 RV = 71/17 PA = 69/24 (39) PCW = 20 Fick cardiac output/index = 7.0/3.7 Thermo CO/CI = 7.2/3.8 PVR = 2.6 WU Ao sat = 97% PA sat =  73%, 73% SVC sat = 73%  RA sat = 72%  Objective:   Weight Range: 64.3 kg (141 lb 12.8 oz) Body mass index is 21.56 kg/m.   Vital Signs:   Temp:  [97.5 F (36.4 C)-97.9 F (36.6 C)] 97.9 F (36.6 C) (05/28 0721) Pulse Rate:  [60] 60 (05/28 0721) Resp:  [12-24] 14 (05/28 0721) BP: (90-141)/(58-96) 119/73 (05/28 0721) SpO2:  [85 %-99 %] 93 % (05/28 0600) Weight:  [64.3 kg (141 lb 12.8 oz)] 64.3 kg (141 lb 12.8 oz) (05/28 0500) Last BM Date: 05/17/18  Weight change: Filed Weights   05/19/18 0500 05/20/18 0500 05/21/18 0500  Weight: 66 kg (145 lb 8.1 oz) 64.8 kg (142 lb 13.7 oz) 64.3 kg (141 lb 12.8 oz)    Intake/Output:   Intake/Output Summary (Last 24 hours) at 05/21/2018 0756 Last data filed at 05/21/2018 0600 Gross per 24 hour  Intake 640 ml  Output 2500 ml  Net -1860 ml      Physical Exam    General:  Sitting up in bed. No resp difficulty HEENT: normal Neck: supple. JVP 7 with prominent CV waves Carotids 2+ bilat; no bruits. No lymphadenopathy or thryomegaly appreciated. Cor: PMI nondisplaced. Irregular rate & rhythm. No rubs, gallops or murmurs. Lungs: clear decreased BS throughout Abdomen: soft, nontender, nondistended. No hepatosplenomegaly. No bruits or masses. Good bowel sounds. Extremities: mild cyanosis. No clubbing, rash, trace edema +UNNA boots Neuro: alert & orientedx3, cranial nerves grossly intact. moves all 4  extremities w/o difficulty. Affect pleasant   Telemetry   Afib 50-60s, personally reviewed  EKG    No new tracings.    Labs    CBC Recent Labs    05/20/18 0223 05/21/18 0315  WBC 5.4 5.1  HGB 12.1* 12.1*  HCT 37.6* 37.1*  MCV 83.0 83.0  PLT 190 295   Basic Metabolic Panel Recent Labs    05/20/18 0223 05/21/18 0315  NA 130* 132*    K 3.8 3.7  CL 92* 94*  CO2 26 28  GLUCOSE 107* 101*  BUN 26* 29*  CREATININE 1.36* 1.42*  CALCIUM 9.0 9.0   Liver Function Tests No results for input(s): AST, ALT, ALKPHOS, BILITOT, PROT, ALBUMIN in the last 72 hours. No results for input(s): LIPASE, AMYLASE in the last 72 hours. Cardiac Enzymes No results for input(s): CKTOTAL, CKMB, CKMBINDEX, TROPONINI in the last 72 hours.  BNP: BNP (last 3 results) Recent Labs    01/09/18 1705 05/14/18 1310  BNP 668.0* 1,247.0*    ProBNP (last 3 results) No results for input(s): PROBNP in the last 8760 hours.   D-Dimer No results for input(s): DDIMER in the last 72 hours. Hemoglobin A1C No results for input(s): HGBA1C in the last 72 hours. Fasting Lipid Panel No results for input(s): CHOL, HDL, LDLCALC, TRIG, CHOLHDL, LDLDIRECT in the last 72 hours. Thyroid Function Tests No results for input(s): TSH, T4TOTAL, T3FREE, THYROIDAB in the last 72 hours.  Invalid input(s): FREET3  Other results:   Imaging    No results found.   Medications:     Scheduled Medications: . [MAR Hold] aspirin  81 mg Oral BH-q7a  . [MAR Hold] Chlorhexidine Gluconate Cloth  6 each Topical Daily  . [MAR Hold] furosemide  80 mg Intravenous BID  . [MAR Hold] lidocaine  1 patch Transdermal Q24H  . [MAR Hold] loratadine  10 mg Oral Daily  . [MAR Hold] macitentan  10 mg Oral Daily  . [MAR Hold] midodrine  10 mg Oral TID WC  . [MAR Hold] sodium chloride flush  3 mL Intravenous Q12H  . [MAR Hold] spironolactone  12.5 mg Oral Daily  . [MAR Hold] tadalafil  40 mg Oral Daily  . [MAR Hold] Warfarin - Pharmacist Dosing Inpatient   Does not apply q1800    Infusions: . [MAR Hold] sodium chloride Stopped (05/17/18 1834)  . 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 Also has COPD (quit smoking in 2002 - 1 ppd x 25 years), HL, HTN   Directly admitted 05/14/18 with concerns for low output CHF.   Assessment/Plan   1. Pulmonary hypertension with cor pulmonale - Echo with bubble study 05/16/18 LVEF 55-60% with "late bubbles" , Mechanical AVR stable with trivial perivalvular regurg, Mild MR, Severe LAE, Severe RV dilation and reduced function. No PFO. Mod TR, PA peak pressure 68 mm Hg - RHC 1/19 with mod/severe pulm HTN with RV failure.   - RHC 05/14/18 with Mild/Moderate PAH in setting of high output with no evidence of intracardiac  shunting. Lance Hicks #s 5/24 increasing PAH now with elevated left-sided pressures and normal output - Ab u/s with cirrhosis but no evidence of portal HTN - Volume status continues to improve. Weight down 25  pounds total. Will stop IV lasix and switch to po torsemide - Continue adcirca 40 mg daily.  - Continue macitentan 10 mg daily.  - Continue midodrine 10 tid  - Auto-immune serologies re-sent but remain negative - ? HHT/shunt/AVMs with late bubbles on bubble study.  - His hemodynamics are quite variable. Initial RHC with severe PAH and low output. Cath earlier this week with moderate PAH and high output (normal PVR). Swan numbers 5/24 with worsening left-sided pressures. Unifying etiology remains unclear suspect it is a combination of cirrhosis and peripheral shunt physiology.  2. Chronic respiratory failure - High Rest CT 05/16/18 with "dependent basilar predominant patchy subpleural reticulation and ground-glass attenuation with the suggestion of minimal associated traction bronchiolectasis. No frank  honeycombing. Findings may indicate an interstitial lung disease such as early usual interstitial pneumonia (UIP) or fibrotic phase nonspecific interstitial pneumonia (NSIP)." - Pulmonary following.    3. Chronic AFL - Rate controlled.  - Continue coumadin with mechanical AVR.  - INR 2.71. Discussed dosing with PharmD personally.   4. CAD - s/p CABG.  - No s/s ischemia   - continue ASA. Will need statin   5. S/p mechanical AVR - Stable on most recent echo. Continue coumadin/ASA 81 - Aware of need for SBE prophylaxis   6. Leukocytoclastic vasculitis - f/u with Rheumatology - No change to current plan.    7. Cirrhosis - US Abdomen RUQ 05/16/18 - s/p cholecystectomy. + hepatic cirrhosis. Mild ascites.  - Liver Doppler 05/16/18 - No hepatic, splenic, or portal venous thrombosis or occlusion. Mild ascites. - Continue midodrine 10 tid  8. R Shoulder pain - ? Rotator cuff tear. Ortho has seen  9. Fever  - ? Line infection.  - Bcx now 1/2 from 5/24 with enterococcus . Cx 5/26 remain NGTD - Abx changed to ampicillin - Concern for prosthetic valve infection. - TEE this am. Appreciate ID input  Can go to SDU.    Length of Stay: [place], MD  05/21/2018, 7:56 AM Advanced Heart Failure Team Pager [phone] (M-F; 7a - 4p)  Please contact [facility] for night-coverage after hours (4p -7a ) and weekends on [email or web address]

## 2018-05-21 NOTE — Progress Notes (Addendum)
Telephone report received from [place], [place] in Endo.  Patient received 5m IV Versed and 50 mcg IV fentanyl.  Exam was negative.  Patient sitting up in recliner eating ice chips and sips of ginger Ale.  VSS.

## 2018-05-21 NOTE — Progress Notes (Addendum)
Patient being transported via wheelchair to Endo for procedure. NAD noted and denies any pain at this time.

## 2018-05-21 NOTE — CV Procedure (Signed)
    TRANSESOPHAGEAL ECHOCARDIOGRAM   NAME:  TSUTOMU BARFOOT   MRN: [id number] DOB:  01-30-1946   ADMIT DATE: 05/14/2018  INDICATIONS: Bacteremia   PROCEDURE:   Informed consent was obtained prior to the procedure. The risks, benefits and alternatives for the procedure were discussed and the patient comprehended these risks.  Risks include, but are not limited to, cough, sore throat, vomiting, nausea, somnolence, esophageal and stomach trauma or perforation, bleeding, low blood pressure, aspiration, pneumonia, infection, trauma to the teeth and death.    After a procedural time-out, the patient was given 3 mg versed and 50 mcg fentanyl for moderate sedation.  The oropharynx was anesthetized with cetacaine spray.  The transesophageal probe was inserted in the esophagus and stomach without difficulty and multiple views were obtained.    COMPLICATIONS:    There were no immediate complications.  FINDINGS:  LEFT VENTRICLE: EF = 50-55%. No regional wall motion abnormalities.  RIGHT VENTRICLE: Dilated. Markedly hypokinetic.   LEFT ATRIUM: Dilated  LEFT ATRIAL APPENDAGE: Surgically amputated  RIGHT ATRIUM: Massively dialted  AORTIC VALVE:  Mechanical prosthesis. Opens well. Trivial perivalvular AI. No vegetation  MITRAL VALVE:    Normal. Mild MR. No vegetation  TRICUSPID VALVE: Normal. Severe TR. No vegetation  PULMONIC VALVE: Grossly normal. Trivial PR. No vegetation   INTERATRIAL SEPTUM: No PFO or ASD.  PERICARDIUM: No effusion  DESCENDING AORTA: Moderate plaque   CONCLUSION:  No TEE evidence of endocarditis.  Quillian Quince Bensimhon,MD 8:43 AM

## 2018-05-22 ENCOUNTER — Ambulatory Visit: Payer: Medicare Other | Admitting: Cardiology

## 2018-05-22 LAB — IRON AND TIBC
Iron: 51 ug/dL (ref 45–182)
Saturation Ratios: 13 % — ABNORMAL LOW (ref 17.9–39.5)
TIBC: 402 ug/dL (ref 250–450)
UIBC: 351 ug/dL

## 2018-05-22 LAB — CBC
HCT: 39.5 % (ref 39.0–52.0)
Hemoglobin: 12.8 g/dL — ABNORMAL LOW (ref 13.0–17.0)
MCH: 27.4 pg (ref 26.0–34.0)
MCHC: 32.4 g/dL (ref 30.0–36.0)
MCV: 84.4 fL (ref 78.0–100.0)
Platelets: 216 10*3/uL (ref 150–400)
RBC: 4.68 MIL/uL (ref 4.22–5.81)
RDW: 17.2 % — ABNORMAL HIGH (ref 11.5–15.5)
WBC: 5.5 10*3/uL (ref 4.0–10.5)

## 2018-05-22 LAB — BASIC METABOLIC PANEL
Anion gap: 12 (ref 5–15)
BUN: 28 mg/dL — ABNORMAL HIGH (ref 6–20)
CO2: 30 mmol/L (ref 22–32)
Calcium: 9.5 mg/dL (ref 8.9–10.3)
Chloride: 93 mmol/L — ABNORMAL LOW (ref 101–111)
Creatinine, Ser: 1.43 mg/dL — ABNORMAL HIGH (ref 0.61–1.24)
GFR calc Af Amer: 55 mL/min — ABNORMAL LOW (ref >60)
GFR calc non Af Amer: 48 mL/min — ABNORMAL LOW (ref >60)
Glucose, Bld: 90 mg/dL (ref 65–99)
Potassium: 3.6 mmol/L (ref 3.5–5.1)
Sodium: 135 mmol/L (ref 135–145)

## 2018-05-22 LAB — CORTISOL: Cortisol, Plasma: 4.5 ug/dL

## 2018-05-22 LAB — CULTURE, BLOOD (ROUTINE X 2): Culture: NO GROWTH

## 2018-05-22 LAB — PROTIME-INR
INR: 2.49
Prothrombin Time: 26.7 seconds — ABNORMAL HIGH (ref 11.4–15.2)

## 2018-05-22 MED ORDER — POLYVINYL ALCOHOL 1.4 % OP SOLN
1.0000 [drp] | OPHTHALMIC | Status: DC | PRN
  Filled 2018-05-22: qty 15

## 2018-05-22 MED ORDER — WARFARIN SODIUM 5 MG PO TABS
5.0000 mg | ORAL_TABLET | Freq: Once | ORAL | Status: AC
  Administered 2018-05-22: 5 mg via ORAL
  Filled 2018-05-22: qty 1

## 2018-05-22 MED ORDER — ATORVASTATIN CALCIUM 40 MG PO TABS
40.0000 mg | ORAL_TABLET | Freq: Every day | ORAL | Status: DC
  Administered 2018-05-22: 40 mg via ORAL
  Filled 2018-05-22: qty 1

## 2018-05-22 NOTE — Care Management Important Message (Signed)
Important Message  Patient Details  Name: Lance Hicks MRN: [id number] Date of Birth: [birth year]   Medicare Important Message Given:  Yes    Obie Silos P Jarad Barth 05/22/2018, 2:08 PM

## 2018-05-22 NOTE — Progress Notes (Addendum)
Advanced Heart Failure Rounding Note  PCP-Cardiologist: No primary care provider on file.   Subjective:    Yesterday he underwent TEE. No evidence of endocarditis. There was severe RV dysfunction.  ID recommending linezoid 600 mg twice a day for total of 9 days.   Denies SOB. Wants to walk. No orthopnea or PND. BP improved on midodrine   Studies:  RHC 05/14/18 showed Mild/Moderate PAH in setting of high output with no evidence of intracardiac shunting. Hemodynamics as below.   High-Res Chest CT 05/16/18 1. Slightly irregular 0.9 cm peripheral right middle lobe solid pulmonary nodule. 3 month follow up recommended.  2. Dependent basilar predominant patchy subpleural reticulation and ground-glass attenuation with the suggestion of minimal associated traction bronchiolectasis. No frank honeycombing. Findings may indicate an interstitial lung disease such as early usual interstitial pneumonia (UIP) or fibrotic phase nonspecific interstitial pneumonia (NSIP). Suggest a follow-up high-resolution chest CT study in 6-12 months to assess temporal pattern stability, as clinically warranted. 3. Mild cardiomegaly. Minimal interlobular septal thickening and trace dependent bilateral pleural effusions.  4. Small to moderate volume perihepatic ascites. 5. Ectatic 4.3 cm ascending thoracic aorta. Recommend annual imaging followup by CTA or MRA.  6. Left main and 3 vessel coronary atherosclerosis.  US Abdomen RUQ 05/16/18 - s/p cholecystectomy. + hepatic cirrhosis. Mild ascites.   Liver Doppler 05/16/18 - No hepatic, splenic, or portal venous thrombosis or occlusion. Mild ascites.  Echo with bubble study 05/16/18 LVEF 55-60% with "late bubbles" , Mechanical AVR stable with trivial perivalvular regurg, Mild MR, Severe LAE, Severe RV dilation and reduced function. No PFO. Mod TR, PA peak pressure 68 mm Hg   Swan numbers PA 60-19 (32) CVP ~10 CO 5.4 CI 2.9  RHC 05/14/18 RA = 18 RV =  71/17 PA = 69/24 (39) PCW = 20 Fick cardiac output/index = 7.0/3.7 Thermo CO/CI = 7.2/3.8 PVR = 2.6 WU Ao sat = 97% PA sat =  73%, 73% SVC sat = 73%  RA sat = 72%  Objective:   Weight Range: 140 lb 3.4 oz (63.6 kg) Body mass index is 21.32 kg/m.   Vital Signs:   Temp:  [97.2 F (36.2 C)-98.4 F (36.9 C)] 97.2 F (36.2 C) (05/29 0719) Pulse Rate:  [60-79] 60 (05/28 2309) Resp:  [11-21] 19 (05/29 0719) BP: (90-118)/(52-70) 104/68 (05/29 0719) SpO2:  [92 %-100 %] 92 % (05/28 2309) Weight:  [140 lb 3.4 oz (63.6 kg)] 140 lb 3.4 oz (63.6 kg) (05/29 0552) Last BM Date: 05/17/18  Weight change: Filed Weights   05/20/18 0500 05/21/18 0500 05/22/18 0552  Weight: 142 lb 13.7 oz (64.8 kg) 141 lb 12.8 oz (64.3 kg) 140 lb 3.4 oz (63.6 kg)    Intake/Output:   Intake/Output Summary (Last 24 hours) at 05/22/2018 0827 Last data filed at 05/21/2018 2317 Gross per 24 hour  Intake 360 ml  Output 1075 ml  Net -715 ml      Physical Exam    General:  Sitting in bed  No resp difficulty. I== HEENT: normal Neck: supple. JVP 5-6 . Carotids 2+ bilat; no bruits. No lymphadenopathy or thryomegaly appreciated. Cor: PMI nondisplaced. Irregular  rate & rhythm. No rubs, gallops or murmurs. Mechanical s2 Lungs: clear no wheeze Abdomen: soft, nontender, nondistended. No hepatosplenomegaly. No bruits or masses. Good bowel sounds. Extremities: no clubbing, rash, edema. RUE sling. Mild cyanosis Neuro: alert & orientedx3, cranial nerves grossly intact. moves all 4 extremities w/o difficulty. Affect pleasant   Telemetry  Afib/flutter 60s personally reviewed.   EKG    No new tracings.    Labs    CBC Recent Labs    05/21/18 0315 05/22/18 0356  WBC 5.1 5.5  HGB 12.1* 12.8*  HCT 37.1* 39.5  MCV 83.0 84.4  PLT 214 284   Basic Metabolic Panel Recent Labs    05/21/18 0315 05/22/18 0356  NA 132* 135  K 3.7 3.6  CL 94* 93*  CO2 28 30  GLUCOSE 101* 90  BUN 29* 28*  CREATININE  1.42* 1.43*  CALCIUM 9.0 9.5   Liver Function Tests No results for input(s): AST, ALT, ALKPHOS, BILITOT, PROT, ALBUMIN in the last 72 hours. No results for input(s): LIPASE, AMYLASE in the last 72 hours. Cardiac Enzymes No results for input(s): CKTOTAL, CKMB, CKMBINDEX, TROPONINI in the last 72 hours.  BNP: BNP (last 3 results) Recent Labs    01/09/18 1705 05/14/18 1310  BNP 668.0* 1,247.0*    ProBNP (last 3 results) No results for input(s): PROBNP in the last 8760 hours.   D-Dimer No results for input(s): DDIMER in the last 72 hours. Hemoglobin A1C No results for input(s): HGBA1C in the last 72 hours. Fasting Lipid Panel No results for input(s): CHOL, HDL, LDLCALC, TRIG, CHOLHDL, LDLDIRECT in the last 72 hours. Thyroid Function Tests No results for input(s): TSH, T4TOTAL, T3FREE, THYROIDAB in the last 72 hours.  Invalid input(s): FREET3  Other results:   Imaging    No results found.   Medications:     Scheduled Medications: . aspirin  81 mg Oral BH-q7a  . lidocaine  1 patch Transdermal Q24H  . loratadine  10 mg Oral Daily  . macitentan  10 mg Oral Daily  . midodrine  10 mg Oral TID WC  . sodium chloride flush  3 mL Intravenous Q12H  . spironolactone  12.5 mg Oral Daily  . tadalafil  40 mg Oral Daily  . torsemide  60 mg Oral Daily  . Warfarin - Pharmacist Dosing Inpatient   Does not apply q1800    Infusions: . sodium chloride Stopped (05/17/18 1834)  . 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 Also has COPD (quit smoking in 2002 - 1 ppd x 25  years), HL, HTN   Directly admitted 05/14/18 with concerns for low output CHF.   Assessment/Plan   1. Pulmonary hypertension with cor pulmonale - Echo with bubble study 05/16/18 LVEF 55-60% with "late bubbles" , Mechanical AVR stable with trivial perivalvular regurg, Mild MR, Severe LAE, Severe RV dilation and reduced function. No PFO. Mod TR, PA peak pressure 68 mm Hg - RHC 1/19 with mod/severe pulm HTN with RV failure.   - RHC 05/14/18 with Mild/Moderate PAH in setting of high output with no evidence of intracardiac shunting. Lance Hicks #s 5/24 increasing PAH now with elevated left-sided pressures and normal output - Ab u/s with cirrhosis but no evidence of portal HTN - Volume status stable. Continue torsemide 60 mg per day. - Continue adcirca 40 mg daily.  - Continue macitentan 10 mg daily.  - Continue midodrine 10 tid  - Auto-immune serologies re-sent but remain negative - ?  HHT/shunt/AVMs with late bubbles on bubble study.  - His hemodynamics are quite variable. Initial RHC with severe PAH and low output. Cath earlier this week with moderate PAH and high output (normal PVR). Swan numbers 5/24 with worsening left-sided pressures. Unifying etiology remains unclear suspect it is a combination of cirrhosis and peripheral shunt physiology.  2. Chronic respiratory failure - [place] CT 05/16/18 with "dependent basilar predominant patchy subpleural reticulation and ground-glass attenuation with the suggestion of minimal associated traction bronchiolectasis. No frank honeycombing. Findings may indicate an interstitial lung disease such as early usual interstitial pneumonia (UIP) or fibrotic phase nonspecific interstitial pneumonia (NSIP)." - Pulmonary following. - Stable.     3. Chronic AFL - Rate controlled.  - Continue coumadin with mechanical AVR.  - INR 2.49. Discussed with pharmacy.    4. CAD - s/p CABG.  - No s/s ischemia   - continue ASA. Will need statin   5. S/p mechanical AVR -  Stable on most recent echo. Continue coumadin/ASA 81 - Aware of need for SBE prophylaxis   6. Leukocytoclastic vasculitis - f/u with Rheumatology - No change to current plan.    7. Cirrhosis - US Abdomen RUQ 05/16/18 - s/p cholecystectomy. + hepatic cirrhosis. Mild ascites.  - Liver Doppler 05/16/18 - No hepatic, splenic, or portal venous thrombosis or occlusion. Mild ascites. - Continue midodrine 10 tid  8. R Shoulder pain - ? Rotator cuff tear. Ortho has seen  9. Fever  - Bcx now 1/2 from 5/24 with enterococcus . Cx 5/26 remain NGTD - TEE negative for vegetation.  - Continue ampicillin while in the hospital.  - Will need linezolid 600 mg twice a day  Once he is discharged. Plan  14 days total of antibiotics.    Anticipate home in am. Consult care management for linezolid.   Length of Stay: [place], NP  05/22/2018, 8:27 AM Advanced Heart Failure Team Pager [phone] (M-F; 7a - 4p)  Please contact [facility] for night-coverage after hours (4p -7a ) and weekends on [email or web address]   Volume status much improved. Now on po diuretics. TEE without evidence enocarditis. ID recommending po linezolid. BP improved on midodrine. Ambulate today Hopefully home in am. Consider Pulmonary Rehab as outpatient.   Glori Bickers, MD  1:58 PM

## 2018-05-22 NOTE — Progress Notes (Signed)
ANTICOAGULATION CONSULT NOTE - [place] for warfarin Indication: atrial fibrillation, mechanical AVR  Allergies  Allergen Reactions  . Amoxicillin-Pot Clavulanate Diarrhea    Stomach pain Has patient had a PCN reaction causing immediate rash, facial/tongue/throat swelling, SOB or lightheadedness with hypotension: No Has patient had a PCN reaction causing severe rash involving mucus membranes or skin necrosis: No Has patient had a PCN reaction that required hospitalization: No Has patient had a PCN reaction occurring within the last 10 years: Yes If all of the above answers are "NO", then may proceed with Cephalosporin use.   . Tape Rash and Other (See Comments)    Surgical tape    Patient Measurements: Height: _0  (172.7 cm) Weight: 140 lb 3.4 oz (63.6 kg) IBW/kg (Calculated) : 68.4   Vital Signs: Temp: 97.2 F (36.2 C) (05/29 0719) Temp Source: Oral (05/29 0719) BP: 104/68 (05/29 0719) Pulse Rate: 60 (05/28 2309)  Labs: Recent Labs    05/20/18 0223 05/21/18 0315 05/22/18 0356  HGB 12.1* 12.1* 12.8*  HCT 37.6* 37.1* 39.5  PLT 190 214 216  LABPROT 27.8* 28.6* 26.7*  INR 2.62 2.71 2.49  CREATININE 1.36* 1.42* 1.43*    Estimated Creatinine Clearance: 42.6 mL/min (A) (by C-G formula based on SCr of 1.43 mg/dL (H)).   Medical History: Past Medical History:  Diagnosis Date  . 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 In light of concurrent left nephrolithiasis, will check CT renal colic next year instead of renal US.   [name] Anticoagulated on Coumadin 01/04/2018  . Anxiety 05/10/2016   Last Assessment & Plan:  Doing well off of zoloft.  . Asymptomatic microscopic hematuria 06/20/2017   Last Assessment & Plan:  Had hematuria workup in [facility] in 2016 which negative CT and cystoscopy. UA with 2+ blood last visit - we discussed recommendation for repeat workup at 5 years or if degree of  hematuria progresses.   . Atrial fibrillation ([place]) 03/29/2017  . Atrial flutter ([place]) 03/29/2017  . Chronic allergic rhinitis 04/28/2016   Last Assessment & Plan:  Continue astelin  . Chronic anticoagulation 03/29/2017  . Chronic atrial fibrillation ([place]) 07/23/2015   Last Assessment & Plan:  Coumadin and metoprolol, cardiology referral to establish care.  . Chronic midline back pain 12/14/2015   Last Assessment & Plan:  Pain management referral for further evaluation.  . Chronic prostatitis 07/23/2015   Last Assessment & Plan:  Has largely resolved since stopping bike riding. Recommend annual DRE AND PSA - will see back 12/2015 for annual screening, given 1st degree fhx. To call office for recurrent prostatitis symptoms.   [name] COPD (chronic obstructive pulmonary disease) ([place]) 06/12/2016  . Coronary artery disease involving native coronary artery of native heart with angina pectoris ([place]) 03/29/2017  . Cough 10/17/2016   Last Assessment & Plan:  Discussed typical course for acute viral illness. If symptoms worsen or fail to improve by 7-10d, delayed ATBs, fluids, rest, NSAIDs/APAP prn. Seek care if not improving. Needs earlier INR check due to ATBs.  [name] Dyspnea 02/01/2016   Last Assessment & Plan:  Overall improving, eval by pulm, plan for CT, neg stress test with cardiology. Recent switch to carvedilol due to side effects.  . Epidermoid cyst of skin 08/24/2017  . Essential hypertension 12/14/2015   Last Assessment & Plan:  Hypertension control: controlled  Medications: compliant Medication Management: as noted in orders Home blood pressure monitoring recommended additionally  as needed for symptoms  The patient's care plan was reviewed and updated. Instructions and counseling were provided regarding patient goals and barriers. He was counseled to adopt a healthy lifestyle. Educational resources and self-management tools have been provided as charted in [facility] list.   . H/O maze procedure 03/29/2017  . H/O mechanical  aortic valve replacement 03/29/2017   Overview:  2011  . Hx of CABG 03/29/2017  . Hyperlipidemia 03/29/2017  . Hypertensive heart disease 03/29/2017  . Kidney stones 07/23/2015   Overview:  x 3  Last Assessment & Plan:  By [place] has left nephrolithiasis, but not visible by KUB. Will check CT renal colic next year to assess both stone burden as well as to surveil AML.   . Leukocytoclastic vasculitis ([place]) 10/01/2017  . Localized edema 01/04/2018  . Lumbar radicular pain 01/19/2016  . Maculopapular rash 09/03/2017  . Nephrolithiasis 07/23/2015   Overview:  x 3  Last Assessment & Plan:  By [place] has left nephrolithiasis, but not visible by KUB. Will check CT renal colic next year to assess both stone burden as well as to surveil AML.   Overview:  x 3  Last Assessment & Plan:  Has 63m nonobstructing LUP stone - not visible by KUB.  Will check renal [place]2019 - he will contact office sooner if symptomatic.   . Non-sustained ventricular tachycardia ([place] 03/29/2017  . Other hyperlipidemia 03/29/2017  . Palpitations 10/01/2017  . Paroxysmal atrial fibrillation ([place] 03/29/2017  . Paroxysmal atrial fibrillation ([place] 03/29/2017  . Pleural effusion, bilateral 08/09/2017  . Post-nasal drainage 02/25/2016   Last Assessment & Plan:  Trial zyrtec and flonase  . Prostate cancer screening 06/20/2017   Last Assessment & Plan:  Recommend continued annual CaP screening until within 10 years of life expectancy. Given good health and fhx of longevity, would anticipate CaP screening to continue until age 72  PSA today and again in one year on day of visit.  . Pulmonary hypertension ([place] 08/09/2017  . Pulmonary nodules 06/12/2016  . S/P AVR (aortic valve replacement) 03/20/2016  . Supratherapeutic INR 07/26/2017  . Syncope 03/29/2017  . Syncope and collapse 02/01/2016  . Typical atrial flutter ([place] 02/01/2016   Assessment: 89yoM on warfarin for AFib and mechanical AVR presents with SOB. INR subtherapeutic on admit, started on IV heparin bridge  now off. INR slightly subtherapeutic at 2.49 today.  PTA Dose = 2.513md except 43m28mat  Goal of Therapy:  (INR goal 2.5-3.5 per PCP) Monitor platelets by anticoagulation protocol: Yes   Plan:  -Warfarin 43mg7m x1 - boost to prevent further drop -Daily INR   MichArrie SenatearmD, BCPS PGY-2 Cardiology Pharmacy Resident Pager: [phone]

## 2018-05-22 NOTE — Care Management (Signed)
LINEZOLID  6000 MG BID  COVER- YES  CO-PAY- $ 23.52  TIER- 5 DRUG  PRIOR APPROVAL-  YES- [phone]

## 2018-05-22 NOTE — Care Management Note (Addendum)
Case Management Note  Patient Details  Name: Lance Hicks MRN: [id number] Date of Birth: 02-15-1946  Subjective/Objective:    Pt admitted with Pulm Htn                Action/Plan:  PTA independent from home.  CM submitted benefit check for PO antibiotics - benefit check documented on physician sticky tab and in CM note.  Pt states he has PCP and denied barriers to paying for medications as prescribed.  Pt educated on daily weights and low sodium diet.     Expected Discharge Date:                  Expected Discharge Plan:  Home/Self Care  In-House Referral:     Discharge planning Services  CM Consult  Post Acute Care Choice:    Choice offered to:     DME Arranged:    DME Agency:     HH Arranged:    HH Agency:     Status of Service:  In process, will continue to follow  If discussed at Long Length of Stay Meetings, dates discussed:    Additional Comments: 05/23/2018 Pt to discharge home today.  Pt interested in outpt therapy with [facility] in [place] for outp PT and OT.  CM confirmed with agency that they are accepted new pts with medicare.  CM requested to fax order, demographic and therapy notes Attention Ria Comment at [phone].  CM requested outpt PT and OT order Maryclare Labrador, RN 05/22/2018, 3:23 PM

## 2018-05-23 LAB — CBC
HCT: 40 % (ref 39.0–52.0)
Hemoglobin: 12.9 g/dL — ABNORMAL LOW (ref 13.0–17.0)
MCH: 27.3 pg (ref 26.0–34.0)
MCHC: 32.3 g/dL (ref 30.0–36.0)
MCV: 84.6 fL (ref 78.0–100.0)
Platelets: 230 10*3/uL (ref 150–400)
RBC: 4.73 MIL/uL (ref 4.22–5.81)
RDW: 17.2 % — ABNORMAL HIGH (ref 11.5–15.5)
WBC: 5.9 10*3/uL (ref 4.0–10.5)

## 2018-05-23 LAB — BASIC METABOLIC PANEL
Anion gap: 11 (ref 5–15)
BUN: 26 mg/dL — ABNORMAL HIGH (ref 6–20)
CO2: 30 mmol/L (ref 22–32)
Calcium: 9.5 mg/dL (ref 8.9–10.3)
Chloride: 95 mmol/L — ABNORMAL LOW (ref 101–111)
Creatinine, Ser: 1.3 mg/dL — ABNORMAL HIGH (ref 0.61–1.24)
GFR calc Af Amer: >60 mL/min (ref >60)
GFR calc non Af Amer: 54 mL/min — ABNORMAL LOW (ref >60)
Glucose, Bld: 87 mg/dL (ref 65–99)
Potassium: 3.6 mmol/L (ref 3.5–5.1)
Sodium: 136 mmol/L (ref 135–145)

## 2018-05-23 LAB — PROTIME-INR
INR: 2.63
Prothrombin Time: 27.9 seconds — ABNORMAL HIGH (ref 11.4–15.2)

## 2018-05-23 MED ORDER — ATORVASTATIN CALCIUM 40 MG PO TABS: 40.0000 mg | ORAL_TABLET | Freq: Every day | ORAL | 6 refills | Status: DC

## 2018-05-23 MED ORDER — WARFARIN SODIUM 2.5 MG PO TABS: 2.5000 mg | ORAL_TABLET | Freq: Once | ORAL | Status: DC

## 2018-05-23 MED ORDER — TRAMADOL HCL 50 MG PO TABS
50.0000 mg | ORAL_TABLET | Freq: Four times a day (QID) | ORAL | 0 refills | Status: AC | PRN
Start: 2018-05-23 — End: 2018-05-28

## 2018-05-23 MED ORDER — TORSEMIDE 20 MG PO TABS: 60.0000 mg | ORAL_TABLET | Freq: Every day | ORAL | 6 refills | Status: DC

## 2018-05-23 MED ORDER — AMOXICILLIN 500 MG PO CAPS: 1000.0000 mg | ORAL_CAPSULE | Freq: Three times a day (TID) | ORAL | 0 refills | Status: AC

## 2018-05-23 MED ORDER — AMOXICILLIN 500 MG PO CAPS
1000.0000 mg | ORAL_CAPSULE | Freq: Three times a day (TID) | ORAL | Status: DC
  Administered 2018-05-23: 1000 mg via ORAL
  Filled 2018-05-23: qty 2

## 2018-05-23 MED ORDER — MIDODRINE HCL 10 MG PO TABS: 10.0000 mg | ORAL_TABLET | Freq: Three times a day (TID) | ORAL | 6 refills | Status: DC

## 2018-05-23 NOTE — Discharge Summary (Addendum)
Advanced Heart Failure Discharge Note  Discharge Summary   Patient ID: Lance Hicks MRN: [id number], DOB/AGE: 02/18/1946 72 y.o. Admit date: 05/14/2018 D/C date:     05/23/2018    Primary Discharge Diagnoses:  1. Pulmonary hypertension with cor pulmonale - On macitentan and adcirca 2. Chronic respiratory failure 3. Chronic atrial flutter 4. CAD s/p CABG 5. S/p mechanical AVR 6. Leukocytoclastic vasculitis 7. Cirrhosis 8. Rotator cuff tear 9. 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 Also has COPD (quit smoking in 2002 - 1 ppd x 25 years), HL, HTN  He called HF clinic on 05/13/18 with symptomatic hypotension, fatigue and weight gain and was admitted directly on 05/14/18. Underwent RHC on 05/14/18, which showed mild to moderate PAH in setting of high output and no evidence of intracardiac shunting. Swan numbers afterward showed increasing PAH with elevated left-sided pressures and normal output. An echo with bubble study was performed and showed EF 55-60% and a small amount of late bubbles consistent with extracardiac shunting, PA peak pressure 68 mmHg. His home macitentan and adcirca were continued. He was diuresed with IV lasix and transitioned to torsemide 60 mg daily. He was also started on midodrine for RV/BP support.   High-Res chest CT showed 0.9 cm RML nodule (3 month follow up recommended) and possible ILD such as UIP or NSIP. Pulmonary was consulted and recommended repeating autoimmune labs, which remained negative. He will be followed by pulmonology outpatient.   Ortho consulted for right shoulder pain related to a horse injury PTA. Xray was negative for fracture. MRI showed  rotator cuff tear. He was given a sling and followed by PT and OT while inpatient. He will be followed by Dr Stann Mainland outpatient. Ambulatory referral to PT and OT has been placed. He was given a prescription for tramadol for pain control at discharge.   On 5/24, he became febrile. Luiz Blare was pulled and he was started on empiric Vancomycin. Blood cultures positive for enerococcus in 1/4 bottles. ID was consulted and changed antibiotic to Ampicillin. TEE completed 5/28 and did not show vegetation. Per ID, he will need amoxicillin 1 gram TID through 05/30/18.  He has chronic atrial flutter and remained rate-controlled. Metoprolol was stopped with low blood pressures. Coumadin was dosed per pharmacy and remained stable. He has his PT/INR checked at his PCP office and plans to get it checked on Monday or Tuesday. He is also on aspirin 81 mg daily due to mechanical AVR. Valve was stable on echo this admission.  Additionally, abdominal US showed cirrhosis and mild ascites with no evidence of portal HTN. Blood pressures improved with addition of midodrine. Liver doppler was obtained and showed no hepatic, splenic, or portal venous thrombosis or occlusion.   He also has a history of CAD s/p CABG. He was started on atorvastatin. Aspirin was continued. He had no s/s ischemia.  Patient will be followed closely in the HF clinic, with appointment as below. Ortho and pulmonary follow up appointments have been scheduled. Ambulatory referral to outpatient PT and OR has been ordered. Consider referral to pulmonary rehab at follow up.   Discharge Weight Range: 140 lbs Discharge Vitals: Blood pressure 119/65, pulse 60, temperature (!)  97.4 F (36.3 C), temperature source Oral, resp. rate 16, height _0  (1.727 m), weight 140 lb 1.6 oz (63.5 kg), SpO2 99 %.  Labs: Lab Results  Component Value Date   WBC 5.9 05/23/2018   HGB 12.9 (L) 05/23/2018   HCT 40.0 05/23/2018   MCV 84.6 05/23/2018   PLT 230 05/23/2018    Recent  Labs  Lab 05/18/18 0234  05/23/18 0257  NA 132*   < > 136  K 3.8   < > 3.6  CL 98*   < > 95*  CO2 23   < > 30  BUN 21*   < > 26*  CREATININE 1.31*   < > 1.30*  CALCIUM 8.7*   < > 9.5  PROT 7.3  --   --   BILITOT 2.5*  --   --   ALKPHOS 69  --   --   ALT 13*  --   --   AST 39  --   --   GLUCOSE 86   < > 87   < > = values in this interval not displayed.   No results found for: CHOL, HDL, LDLCALC, TRIG BNP (last 3 results) Recent Labs    01/09/18 1705 05/14/18 1310  BNP 668.0* 1,247.0*    ProBNP (last 3 results) No results for input(s): PROBNP in the last 8760 hours.   Diagnostic Studies/Procedures   RHC 05/14/18 showed Mild/Moderate PAH in setting of high output with no evidence of intracardiac shunting. Hemodynamics as below.   High-Res Chest CT 05/16/18 1. Slightly irregular 0.9 cm peripheral right middle lobe solid pulmonary nodule. 3 month follow up recommended.  2. Dependent basilar predominant patchy subpleural reticulation and ground-glass attenuation with the suggestion of minimal associated traction bronchiolectasis. No frank honeycombing. Findings may indicate an interstitial lung disease such as early usual interstitial pneumonia (UIP) or fibrotic phase nonspecific interstitial pneumonia (NSIP). Suggest a follow-up high-resolution chest CT study in 6-12 months to assess temporal pattern stability, as clinically warranted. 3. Mild cardiomegaly. Minimal interlobular septal thickening and trace dependent bilateral pleural effusions.  4. Small to moderate volume perihepatic ascites. 5. Ectatic 4.3 cm ascending thoracic aorta. Recommend annual imaging followup by CTA or MRA.  6. Left main and 3 vessel coronary atherosclerosis.  US Abdomen RUQ 05/16/18 - s/p cholecystectomy. + hepatic cirrhosis. Mild ascites.   Liver Doppler 05/16/18 - No hepatic, splenic, or portal venous thrombosis or occlusion. Mild ascites.  Echo with bubble study 05/16/18 LVEF 55-60%  with "late bubbles" , Mechanical AVR stable with trivial perivalvular regurg, Mild MR, Severe LAE, Severe RV dilation and reduced function. No PFO. Mod TR, PA peak pressure 68 mm Hg  Swan numbers 05/17/18: PA 60-19 (32) CVP ~10 CO 5.4 CI 2.9  RHC 05/14/18 RA = 18 RV = 71/17 PA = 69/24 (39) PCW = 20 Fick cardiac output/index = 7.0/3.7 Thermo CO/CI = 7.2/3.8 PVR = 2.6 WU Ao sat = 97% PA sat = 73%, 73% SVC sat = 73%  RA sat = 72%   Discharge Medications   Allergies as of 05/23/2018      Reactions   Amoxicillin-pot Clavulanate Diarrhea   Stomach pain Has patient had a PCN reaction causing immediate rash, facial/tongue/throat swelling, SOB or lightheadedness with hypotension: No Has patient had a PCN reaction causing severe rash involving mucus membranes or skin necrosis: No Has patient had a PCN reaction that required hospitalization: No Has patient had a PCN reaction occurring within the last 10 years:  Yes If all of the above answers are "NO", then may proceed with Cephalosporin use.   Tape Rash, Other (See Comments)   Surgical tape      Medication List    STOP taking these medications   furosemide 40 MG tablet Commonly known as:  LASIX   metoprolol tartrate 25 MG tablet Commonly known as:  LOPRESSOR     TAKE these medications   acetaminophen 650 MG CR tablet Commonly known as:  TYLENOL Take 650 mg by mouth every 8 (eight) hours as needed for pain.   amoxicillin 500 MG capsule Commonly known as:  AMOXIL Take 2 capsules (1,000 mg total) by mouth every 8 (eight) hours for 7 days.   aspirin EC 81 MG tablet Take 81 mg by mouth daily.   atorvastatin 40 MG tablet Commonly known as:  LIPITOR Take 1 tablet (40 mg total) by mouth daily at 6 PM.   B-complex with vitamin C tablet Take 1 tablet by mouth daily.   budesonide 0.5 MG/2ML nebulizer solution Commonly known as:  PULMICORT Take 0.5 mg by nebulization daily as needed (for shortness of breath or  wheezing).   esomeprazole 20 MG capsule Commonly known as:  NEXIUM Take 20 mg by mouth daily as needed (acid reflux).   fluticasone 50 MCG/ACT nasal spray Commonly known as:  FLONASE Place 1-2 sprays into both nostrils as needed for allergies.   macitentan 10 MG tablet Commonly known as:  OPSUMIT Take 1 tablet (10 mg total) by mouth daily.   midodrine 10 MG tablet Commonly known as:  PROAMATINE Take 1 tablet (10 mg total) by mouth 3 (three) times daily with meals.   multivitamin-lutein Caps capsule Take 1 capsule by mouth daily.   omeprazole 20 MG capsule Commonly known as:  PRILOSEC Take 20 mg by mouth daily.   QUNOL ULTRA COQ10 100-150 MG-UNIT Caps Generic drug:  Coenzyme Q10-Vitamin E Take 1 capsule by mouth every other day.   SALONPAS EX Apply 1 patch topically daily as needed (pain).   spironolactone 25 MG tablet Commonly known as:  ALDACTONE Take 0.5 tablets (12.5 mg total) by mouth daily.   tadalafil (PAH) 20 MG tablet Commonly known as:  ADCIRCA Take 2 tablets (40 mg total) by mouth daily.   torsemide 20 MG tablet Commonly known as:  DEMADEX Take 3 tablets (60 mg total) by mouth daily.   traMADol 50 MG tablet Commonly known as:  ULTRAM Take 1-2 tablets (50-100 mg total) by mouth every 6 (six) hours as needed for up to 5 days (57m for mild pain, 7104mfor moderate pain, 10099mor severe pain).   TUDORZA PRESSAIR 400 MCG/ACT Aepb Generic drug:  Aclidinium Bromide Inhale 1 puff into the lungs 2 (two) times daily as needed (for shortness of breath or wheezing).   warfarin 2.5 MG tablet Commonly known as:  COUMADIN Take 2.5-5 mg by mouth See admin instructions. Take 2.5mg40m all days except Saturday, take 5mg.14mZYRTEC ALLERGY 10 MG tablet Generic drug:  cetirizine Take 10 mg by mouth daily as needed for allergies.       Disposition   The patient will be discharged in stable condition to home. Discharge Instructions    (HEART FAILURE PATIENTS)  Call MD:  Anytime you have any of the following symptoms: 1) 3 pound weight gain in 24 hours or 5 pounds in 1 week 2) shortness of breath, with or without a dry hacking cough 3) swelling in the hands, feet or  stomach 4) if you have to sleep on extra pillows at night in order to breathe.   Complete by:  As directed    Ambulatory referral to Occupational Therapy   Complete by:  As directed    Rotator cuff tear   Ambulatory referral to Physical Therapy   Complete by:  As directed    For rotator cuff tear   Call MD for:  persistant dizziness or light-headedness   Complete by:  As directed    Call MD for:  redness, tenderness, or signs of infection (pain, swelling, redness, odor or green/yellow discharge around incision site)   Complete by:  As directed    Call MD for:  temperature >100.4   Complete by:  As directed    Diet - low sodium heart healthy   Complete by:  As directed    Heart Failure patients record your daily weight using the same scale at the same time of day   Complete by:  As directed    Increase activity slowly   Complete by:  As directed    STOP any activity that causes chest pain, shortness of breath, dizziness, sweating, or exessive weakness   Complete by:  As directed      Follow-up Information    C[place], [place], PA-C Follow up on 06/03/2018.   Specialty:  Physician Assistant Why:  Heart Failure Follow up-2:00-Parking in ER lot (enter under blue "Specialty Clinics" awning) or under [place] (Construction entrance with big white signs, garage code: 1300, elevator 1st floor). Take all am meds, bring all med bottles. Contact information: [place] [address] [phone]        Nicholes Stairs, MD Follow up on 05/28/2018.   Specialty:  Orthopedic Surgery Why:  2:00 pm Contact information: [address] [place] [address] [phone]        Collene Gobble, MD Follow up on 06/20/2018.   Specialty:   Pulmonary Disease Why:  4:15 pm. Please arrive by 4 pm.  Contact information: [address] [place] [address] [phone]             Duration of Discharge Encounter: Greater than 35 minutes   Signed, Georgiana Shore, NP 05/23/2018, 1:34 PM  Patient seen and examined with the above-signed Advanced Practice Provider and/or Housestaff. I personally reviewed laboratory data, imaging studies and relevant notes. I independently examined the patient and formulated the important aspects of the plan. I have edited the note to reflect any of my changes or salient points. I have personally discussed the plan with the patient and/or family.  Much improved after 25 pound diuresis. Stable to go home today on oral torsemide and midodrine. Continue adcirca and macitentan. Continue linezolid for enterococcal infection.    Glori Bickers, MD  9:07 PM

## 2018-05-23 NOTE — Discharge Instructions (Signed)

## 2018-05-23 NOTE — Care Management Note (Signed)
Case Management Note  Patient Details  Name: Lance Hicks MRN: [id number] Date of Birth: 09/23/1946  Subjective/Objective:    Pt admitted with Pulm Htn                Action/Plan:  PTA independent from home.  CM submitted benefit check for PO antibiotics - benefit check documented on physician sticky tab and in CM note.  Pt states he has PCP and denied barriers to paying for medications as prescribed.  Pt educated on daily weights and low sodium diet.     Expected Discharge Date:  05/23/18               Expected Discharge Plan:  Home/Self Care  In-House Referral:     Discharge planning Services  CM Consult  Post Acute Care Choice:    Choice offered to:     DME Arranged:    DME Agency:     HH Arranged:    HH Agency:     Status of Service:  In process, will continue to follow  If discussed at Long Length of Stay Meetings, dates discussed:    Additional Comments: 05/23/2018  Pt has now decided to go to [facility] outpt therapy in [place] - pt stated he preferred [facility] and location is close enough to his home.  Attending service made ambulatory referral for both OT and PT.  Pt to discharge home today.  Pt interested in outpt therapy with [facility] in [place] for outp PT and OT.  CM confirmed with agency that they are accepted new pts with medicare.  CM requested to fax order, demographic and therapy notes Attention Ria Comment at [phone].  CM requested outpt PT and OT order Maryclare Labrador, RN 05/23/2018, 2:10 PM

## 2018-05-23 NOTE — Progress Notes (Signed)
IV's x2 removed; educated on d/c instructions; given prescription; belongings collected; will wheel out once ready; waiting on PT/OT outpt orders.   [place]  Deazia Lampi, RN

## 2018-05-23 NOTE — Progress Notes (Addendum)
Una boots removed; pt will wear TED hose at home. Will wheel pt out once done using bathroom.   [place]  Sharah Finnell, RN

## 2018-05-23 NOTE — Progress Notes (Signed)
ANTICOAGULATION CONSULT NOTE - [place] for warfarin Indication: atrial fibrillation, mechanical AVR  Allergies  Allergen Reactions  . Amoxicillin-Pot Clavulanate Diarrhea    Stomach pain Has patient had a PCN reaction causing immediate rash, facial/tongue/throat swelling, SOB or lightheadedness with hypotension: No Has patient had a PCN reaction causing severe rash involving mucus membranes or skin necrosis: No Has patient had a PCN reaction that required hospitalization: No Has patient had a PCN reaction occurring within the last 10 years: Yes If all of the above answers are "NO", then may proceed with Cephalosporin use.   . Tape Rash and Other (See Comments)    Surgical tape    Patient Measurements: Height: _0  (172.7 cm) Weight: 140 lb 1.6 oz (63.5 kg) IBW/kg (Calculated) : 68.4   Vital Signs: Temp: 97.4 F (36.3 C) (05/30 0716) Temp Source: Oral (05/30 0716) BP: 100/66 (05/30 0716)  Labs: Recent Labs    05/21/18 0315 05/22/18 0356 05/23/18 0257  HGB 12.1* 12.8* 12.9*  HCT 37.1* 39.5 40.0  PLT 214 216 230  LABPROT 28.6* 26.7* 27.9*  INR 2.71 2.49 2.63  CREATININE 1.42* 1.43* 1.30*    Estimated Creatinine Clearance: 46.8 mL/min (A) (by C-G formula based on SCr of 1.3 mg/dL (H)).   Medical History: Past Medical History:  Diagnosis Date  . 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 In light of concurrent left nephrolithiasis, will check CT renal colic next year instead of renal US.   [name] Anticoagulated on Coumadin 01/04/2018  . Anxiety 05/10/2016   Last Assessment & Plan:  Doing well off of zoloft.  . Asymptomatic microscopic hematuria 06/20/2017   Last Assessment & Plan:  Had hematuria workup in [facility] in 2016 which negative CT and cystoscopy. UA with 2+ blood last visit - we discussed recommendation for repeat workup at 5 years or if degree of hematuria progresses.   . Atrial  fibrillation ([place]) 03/29/2017  . Atrial flutter ([place]) 03/29/2017  . Chronic allergic rhinitis 04/28/2016   Last Assessment & Plan:  Continue astelin  . Chronic anticoagulation 03/29/2017  . Chronic atrial fibrillation ([place]) 07/23/2015   Last Assessment & Plan:  Coumadin and metoprolol, cardiology referral to establish care.  . Chronic midline back pain 12/14/2015   Last Assessment & Plan:  Pain management referral for further evaluation.  . Chronic prostatitis 07/23/2015   Last Assessment & Plan:  Has largely resolved since stopping bike riding. Recommend annual DRE AND PSA - will see back 12/2015 for annual screening, given 1st degree fhx. To call office for recurrent prostatitis symptoms.   [name] COPD (chronic obstructive pulmonary disease) ([place]) 06/12/2016  . Coronary artery disease involving native coronary artery of native heart with angina pectoris ([place]) 03/29/2017  . Cough 10/17/2016   Last Assessment & Plan:  Discussed typical course for acute viral illness. If symptoms worsen or fail to improve by 7-10d, delayed ATBs, fluids, rest, NSAIDs/APAP prn. Seek care if not improving. Needs earlier INR check due to ATBs.  [name] Dyspnea 02/01/2016   Last Assessment & Plan:  Overall improving, eval by pulm, plan for CT, neg stress test with cardiology. Recent switch to carvedilol due to side effects.  . Epidermoid cyst of skin 08/24/2017  . Essential hypertension 12/14/2015   Last Assessment & Plan:  Hypertension control: controlled  Medications: compliant Medication Management: as noted in orders Home blood pressure monitoring recommended additionally as needed for symptoms  The patient's care plan was reviewed and updated. Instructions and counseling were provided regarding patient goals and barriers. He was counseled to adopt a healthy lifestyle. Educational resources and self-management tools have been provided as charted in [facility] list.   . H/O maze procedure 03/29/2017  . [name] 03/29/2017    Overview:  2011  . Hx of CABG 03/29/2017  . Hyperlipidemia 03/29/2017  . Hypertensive heart disease 03/29/2017  . Kidney stones 07/23/2015   Overview:  x 3  Last Assessment & Plan:  By [place] has left nephrolithiasis, but not visible by KUB. Will check CT renal colic next year to assess both stone burden as well as to surveil AML.   . Leukocytoclastic vasculitis ([place]) 10/01/2017  . Localized edema 01/04/2018  . Lumbar radicular pain 01/19/2016  . Maculopapular rash 09/03/2017  . Nephrolithiasis 07/23/2015   Overview:  x 3  Last Assessment & Plan:  By [place] has left nephrolithiasis, but not visible by KUB. Will check CT renal colic next year to assess both stone burden as well as to surveil AML.   Overview:  x 3  Last Assessment & Plan:  Has 24m nonobstructing LUP stone - not visible by KUB.  Will check renal [place] - he will contact office sooner if symptomatic.   . Non-sustained ventricular tachycardia ([place] 03/29/2017  . Other hyperlipidemia 03/29/2017  . Palpitations 10/01/2017  . Parox[place]mal atrial fibrillation ([place] 03/29/2017  . Paroxysmal atrial fibrillation ([place] 03/29/2017  . Pleural effusion, bilateral 08/09/2017  . Post-nasal drainage 02/25/2016   Last Assessment & Plan:  Trial zyrtec and flonase  . Prostate cancer screening 06/20/2017   Last Assessment & Plan:  Recommend continued annual CaP screening until within 10 years of life expectancy. Given good health and fhx of longevity, would anticipate CaP screening to continue until age 72  PSA today and again in one year on day of visit.  . Pulmonary hypertension ([place] 08/09/2017  . Pulmonary nodules 06/12/2016  . S/P AVR (aortic valve replacement) 03/20/2016  . Supratherapeutic INR 07/26/2017  . Syncope 03/29/2017  . Syncope and collapse 02/01/2016  . Typical atrial flutter ([place] 02/01/2016   Assessment: 89yoM on warfarin for AFib and mechanical AVR presents with SOB. INR subtherapeutic on admit, started on IV heparin bridge now off. INR therapeutic at 2.63  today.  Pt to be discharged today - recommend 549mSat and 2.26m8mll other days. Of note, per ID team recommendations Zyvox therapy will be cost-prohibitive. Given no evidence of endocarditis on TEE they recommend amoxicillin 1000m77m three times daily for total duration of 14 days (ABX through 05/30/18) .  PTA Dose = 2.26mg/70mxcept 26mg S46m Goal of Therapy:  (INR goal 2.5-3.5 per PCP) Monitor platelets by anticoagulation protocol: Yes   Plan:  -Warfarin 2.26mg PO56mily with 26mg on 57m -Recommend INR follow-up within next 2 weeks -Ampicillin to amoxicillin 1000mg [id number]   Lynn BArrie Senate BCPS PGY-2 Cardiology Pharmacy Resident Pager: [phone]

## 2018-05-23 NOTE — Progress Notes (Addendum)
Advanced Heart Failure Rounding Note  PCP-Cardiologist: No primary care provider on file.   Subjective:    05/21/18 underwent TEE. No evidence of endocarditis. There was severe RV dysfunction.   ID now recommending amoxicillin 1 gram TID through 6/6.   Weight stable on PO diuretics. Creatinine stable at 1.3. No fevers or chills. BP stable on midodrine.   Feels good. Already walked this morning. No CP, SOB, dizziness, or bleeding.   Studies:  RHC 05/14/18 showed Mild/Moderate PAH in setting of high output with no evidence of intracardiac shunting. Hemodynamics as below.   High-Res Chest CT 05/16/18 1. Slightly irregular 0.9 cm peripheral right middle lobe solid pulmonary nodule. 3 month follow up recommended.  2. Dependent basilar predominant patchy subpleural reticulation and ground-glass attenuation with the suggestion of minimal associated traction bronchiolectasis. No frank honeycombing. Findings may indicate an interstitial lung disease such as early usual interstitial pneumonia (UIP) or fibrotic phase nonspecific interstitial pneumonia (NSIP). Suggest a follow-up high-resolution chest CT study in 6-12 months to assess temporal pattern stability, as clinically warranted. 3. Mild cardiomegaly. Minimal interlobular septal thickening and trace dependent bilateral pleural effusions.  4. Small to moderate volume perihepatic ascites. 5. Ectatic 4.3 cm ascending thoracic aorta. Recommend annual imaging followup by CTA or MRA.  6. Left main and 3 vessel coronary atherosclerosis.  US Abdomen RUQ 05/16/18 - s/p cholecystectomy. + hepatic cirrhosis. Mild ascites.   Liver Doppler 05/16/18 - No hepatic, splenic, or portal venous thrombosis or occlusion. Mild ascites.  Echo with bubble study 05/16/18 LVEF 55-60% with "late bubbles" , Mechanical AVR stable with trivial perivalvular regurg, Mild MR, Severe LAE, Severe RV dilation and reduced function. No PFO. Mod TR, PA peak pressure 68  mm Hg  Swan numbers 05/17/18: PA 60-19 (32) CVP ~10 CO 5.4 CI 2.9  RHC 05/14/18 RA = 18 RV = 71/17 PA = 69/24 (39) PCW = 20 Fick cardiac output/index = 7.0/3.7 Thermo CO/CI = 7.2/3.8 PVR = 2.6 WU Ao sat = 97% PA sat =  73%, 73% SVC sat = 73%  RA sat = 72%  Objective:   Weight Range: 140 lb 1.6 oz (63.5 kg) Body mass index is 21.3 kg/m.   Vital Signs:   Temp:  [97.5 F (36.4 C)-97.9 F (36.6 C)] 97.7 F (36.5 C) (05/30 0313) Resp:  [15-20] 20 (05/30 0313) BP: (104-117)/(61-78) 104/61 (05/30 0313) SpO2:  [98 %-99 %] 99 % (05/29 1142) Weight:  [140 lb 1.6 oz (63.5 kg)] 140 lb 1.6 oz (63.5 kg) (05/30 0512) Last BM Date: 05/22/18  Weight change: Filed Weights   05/21/18 0500 05/22/18 0552 05/23/18 0512  Weight: 141 lb 12.8 oz (64.3 kg) 140 lb 3.4 oz (63.6 kg) 140 lb 1.6 oz (63.5 kg)    Intake/Output:   Intake/Output Summary (Last 24 hours) at 05/23/2018 0731 Last data filed at 05/22/2018 1853 Gross per 24 hour  Intake 1322 ml  Output 600 ml  Net 722 ml      Physical Exam    General: No resp difficulty. HEENT: Normal anicteric  Neck: Supple. JVP 7-8. Carotids 2+ bilat; no bruits. No thyromegaly or nodule noted. Cor: PMI nondisplaced. IRR, No M/G/R noted. Mechanical S2 Lungs: CTAB, normal effort. No wheeze  Abdomen: Soft, non-tender, non-distended, no HSM. No bruits or masses. +BS  Extremities: No clubbing, or rash. R and LLE no edema. RUE sling. Mild cyanosis Neuro: Alert & orientedx3, cranial nerves grossly intact. moves all 4 extremities w/o difficulty. Affect pleasant  Telemetry   Aflutter 60s this am. Afib up to 120s overnight. Personally reviewed.   EKG    No new tracings.   Labs    CBC Recent Labs    05/22/18 0356 05/23/18 0257  WBC 5.5 5.9  HGB 12.8* 12.9*  HCT 39.5 40.0  MCV 84.4 84.6  PLT 216 546   Basic Metabolic Panel Recent Labs    05/22/18 0356 05/23/18 0257  NA 135 136  K 3.6 3.6  CL 93* 95*  CO2 30 30  GLUCOSE 90  87  BUN 28* 26*  CREATININE 1.43* 1.30*  CALCIUM 9.5 9.5   Liver Function Tests No results for input(s): AST, ALT, ALKPHOS, BILITOT, PROT, ALBUMIN in the last 72 hours. No results for input(s): LIPASE, AMYLASE in the last 72 hours. Cardiac Enzymes No results for input(s): CKTOTAL, CKMB, CKMBINDEX, TROPONINI in the last 72 hours.  BNP: BNP (last 3 results) Recent Labs    01/09/18 1705 05/14/18 1310  BNP 668.0* 1,247.0*    ProBNP (last 3 results) No results for input(s): PROBNP in the last 8760 hours.   D-Dimer No results for input(s): DDIMER in the last 72 hours. Hemoglobin A1C No results for input(s): HGBA1C in the last 72 hours. Fasting Lipid Panel No results for input(s): CHOL, HDL, LDLCALC, TRIG, CHOLHDL, LDLDIRECT in the last 72 hours. Thyroid Function Tests No results for input(s): TSH, T4TOTAL, T3FREE, THYROIDAB in the last 72 hours.  Invalid input(s): FREET3  Other results:   Imaging    No results found.   Medications:     Scheduled Medications: . aspirin  81 mg Oral BH-q7a  . atorvastatin  40 mg Oral q1800  . lidocaine  1 patch Transdermal Q24H  . loratadine  10 mg Oral Daily  . macitentan  10 mg Oral Daily  . midodrine  10 mg Oral TID WC  . sodium chloride flush  3 mL Intravenous Q12H  . spironolactone  12.5 mg Oral Daily  . tadalafil  40 mg Oral Daily  . torsemide  60 mg Oral Daily  . Warfarin - Pharmacist Dosing Inpatient   Does not apply q1800    Infusions: . sodium chloride Stopped (05/17/18 1834)  . 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 Also has COPD (quit smoking in 2002 - 1 ppd x 25 years), HL, HTN   Directly admitted 05/14/18 with concerns for low output CHF.   Assessment/Plan   1. Pulmonary hypertension with cor pulmonale - Echo with bubble study 05/16/18 LVEF 55-60% with "late bubbles" , Mechanical AVR stable with trivial perivalvular regurg, Mild MR, Severe LAE, Severe RV dilation and reduced function. No PFO. Mod TR, PA peak pressure 68 mm Hg - RHC 1/19 with mod/severe pulm HTN with RV failure.   - RHC 05/14/18 with Mild/Moderate PAH in setting of high output with no evidence of intracardiac shunting. Luiz Blare #s 5/24 increasing PAH now with elevated left-sided pressures and normal output - Ab u/s with cirrhosis but no evidence of portal HTN - Volume status stable. Continue torsemide 60 mg per day. - Continue adcirca 40 mg daily.  - Continue  macitentan 10 mg daily.  - Continue midodrine 10 tid  - Auto-immune serologies re-sent but remain negative - ? HHT/shunt/AVMs with late bubbles on bubble study.  - His hemodynamics are quite variable. Initial RHC with severe PAH and low output. Cath earlier this week with moderate PAH and high output (normal PVR). Swan numbers 5/24 with worsening left-sided pressures. Unifying etiology remains unclear suspect it is a combination of cirrhosis and peripheral shunt physiology. - Consider pulmonary rehab outpatient  2. Chronic respiratory failure - [place] CT 05/16/18 with "dependent basilar predominant patchy subpleural reticulation and ground-glass attenuation with the suggestion of minimal associated traction bronchiolectasis. No frank honeycombing. Findings may indicate an interstitial lung disease such as early usual interstitial pneumonia (UIP) or fibrotic phase nonspecific interstitial pneumonia (NSIP)." - Pulmonary following. - Stable.     3. Chronic AFL - Rate controlled.  - Continue coumadin with mechanical AVR.  - INR 2.63. Discussed with  pharmacy.    4. CAD - s/p CABG.  - No s/s ischemia   - continue ASA and statin.    5. S/p mechanical AVR - Stable on most recent echo. Continue coumadin/ASA 81. INR 2.63 - Aware of need for SBE prophylaxis   6. Leukocytoclastic vasculitis - f/u with Rheumatology - No change.   7. Cirrhosis - US Abdomen RUQ 05/16/18 - s/p cholecystectomy. + hepatic cirrhosis. Mild ascites.  - Liver Doppler 05/16/18 - No hepatic, splenic, or portal venous thrombosis or occlusion. Mild ascites. - Continue midodrine 10 tid. SBP 100s  8. R Shoulder pain - ? Rotator cuff tear. Ortho has seen. No change.   9. Fever  - Bcx now 1/2 from 5/24 with enterococcus . Cx 5/26 remain NGTD - TEE negative for vegetation.  - Continue ampicillin while in the hospital.  - Per ID, he will need amoxicillin 1 gram TID through 6/6.   Likely will be discharged today.  He would like to drive home. Lives 45 minutes away.   Length of Stay: [address] NP  05/23/2018, 7:31 AM Advanced Heart Failure Team Pager [phone] (M-F; 7a - 4p)  Please contact [facility] for night-coverage after hours (4p -7a ) and weekends on [email or web address]  Patient seen and examined with the above-signed Advanced Practice Provider and/or Housestaff. I personally reviewed laboratory data, imaging studies and relevant notes. I independently examined the patient and formulated the important aspects of the plan. I have edited the note to reflect any of my changes or salient points. I have personally discussed the plan with the patient and/or family.  Much improved after 25 pound diuresis. Stable to go home today on oral torsemide and midodrine. Continue adcirca and macitentan. Continue linezolid for enterococcal infection.   Glori Bickers, MD  10:34 AM

## 2018-05-25 LAB — CULTURE, BLOOD (ROUTINE X 2)
Culture: NO GROWTH
Culture: NO GROWTH
Special Requests: ADEQUATE
Special Requests: ADEQUATE

## 2018-05-28 DIAGNOSIS — M75101 Unspecified rotator cuff tear or rupture of right shoulder, not specified as traumatic: Secondary | ICD-10-CM | POA: Diagnosis not present

## 2018-05-29 ENCOUNTER — Encounter: Payer: Self-pay | Admitting: Gastroenterology

## 2018-05-30 ENCOUNTER — Ambulatory Visit (INDEPENDENT_AMBULATORY_CARE_PROVIDER_SITE_OTHER): Payer: Medicare Other | Admitting: Gastroenterology

## 2018-05-30 ENCOUNTER — Encounter: Payer: Self-pay | Admitting: Gastroenterology

## 2018-05-30 VITALS — BP 120/62 | HR 64 | Ht 69.0 in | Wt 145.0 lb

## 2018-05-30 DIAGNOSIS — K219 Gastro-esophageal reflux disease without esophagitis: Secondary | ICD-10-CM | POA: Diagnosis not present

## 2018-05-30 DIAGNOSIS — K7460 Unspecified cirrhosis of liver: Secondary | ICD-10-CM | POA: Diagnosis not present

## 2018-05-30 MED ORDER — PANTOPRAZOLE SODIUM 40 MG PO TBEC: 40.0000 mg | DELAYED_RELEASE_TABLET | Freq: Every day | ORAL | 11 refills | Status: DC

## 2018-05-30 NOTE — Patient Instructions (Signed)
If you are age 71 or older, your body mass index should be between 23-30. Your Body mass index is 21.41 kg/m. If this is out of the aforementioned range listed, please consider follow up with your Primary Care Provider.  If you are age 81 or younger, your body mass index should be between 19-25. Your Body mass index is 21.41 kg/m. If this is out of the aformentioned range listed, please consider follow up with your Primary Care Provider.   We have sent the following medications to your pharmacy for you to pick up at your convenience: Protonix 40 mg take one tablet by mouth daily.   Stop Omeprazole  Please call Dr. Leland Her nurse Leeanne Rio, RN)  in 2 weeks at [phone]  to let her now how you are doing.   Thank you,  Dr. Jackquline Denmark

## 2018-05-30 NOTE — Progress Notes (Signed)
Chief Complaint: GERD  Referring Provider:  Townsend Roger, MD      ASSESSMENT AND PLAN;   #1. GERD (s/p EGD 06/2017 - at [facility], [place] -report reviewed, mild gastritis with negative biopsies, no varices) Plan: -protonix 34m po qd. He will continue to get PT checked at Dr. VDoran Durandoffice frequently. -Stop omeprazole. -Patient is to call [place] 2 weeks to let [place] how he is doing.  #2.  Liver Cirrhosis (likely cardiac liver cirrhosis). No ETOH. No evidence of portal hypertension.  Mild ascites likely due to right heart failure associated with cor pulmonale with pulmonary hypertension. [place]/2019 Plan -Fluid and salt restricted diet is already being done. -Continue torsemide and spironolactone as per cardiology. -No need for liver biopsy or any further liver work-up at the present time.  Besides, patient is on Coumadin due to AVR. -Do recommend annual liver ultrasounds. -Patient will call [place] 2 weeks to let [place] how he is doing.  He is to follow-up in 6 months.  At follow-up, recheck liver function tests, ammonia.    HPI:    Lance BILLYis a 72y.o. male recently discharged from the hospital was admitted with enterococcal bacteremia and is currently on Augmentin.  Doing fairly well.  His main complaint today is that of reflux especially in the morning.  He describes this as heartburn with occasional nausea without vomiting.  He was given Protonix in the hospital with good relief.  He is currently taking omeprazole 20 mg p.o. once a day.  He had recent EGD which showed gastritis without any varices.  His ultrasound during admission showed liver cirrhosis without portal hypertension.  There is mild ascites which was attributed to CHF.  No odynophagia or dysphagia.  He denies having any significant diarrhea or constipation.  No melena or hematochezia.  Discharge diagnoses from recent discharge summary: 1. Pulmonary hypertension with cor pulmonale with right-sided  failure. - On macitentan and adcirca 2. Chronic respiratory failure 3. Chronic atrial flutter 4. CAD s/p CABG 5. S/p mechanical AVR 6. Leukocytoclastic vasculitis 7. Cirrhosis 8. Rotator cuff tear 9. Fever with enterococcal bacteremia, neg WU for endocarditis including transesophageal echo.    Past GI procedures : EGD 06/2011 as above.   Colonoscopy 2015. Had small polyp in 2012.   Past Medical History:  Diagnosis Date  . 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 In light of concurrent left nephrolithiasis, will check CT renal colic next year instead of renal [place]   .Marland KitchenAnticoagulated on Coumadin 01/04/2018  . Anxiety 05/10/2016   Last Assessment & Plan:  Doing well off of zoloft.  . Asymptomatic microscopic hematuria 06/20/2017   Last Assessment & Plan:  [facility]. UA with 2+ blood last visit - we discussed recommendation for repeat workup at 5 years or if degree of hematuria progresses.   . Atrial fibrillation (HC[place] 03/29/2017  . Atrial flutter ([place] 03/29/2017  . Chronic allergic rhinitis 04/28/2016   Last Assessment & Plan:  Continue astelin  . Chronic anticoagulation 03/29/2017  . Chronic atrial fibrillation (HA[place] 07/23/2015   Last Assessment & Plan:  Coumadin and metoprolol, cardiology referral to establish care.  . Chronic midline back pain 12/14/2015   Last Assessment & Plan:  Pain management referral for further evaluation.  . Chronic prostatitis 07/23/2015   Last Assessment & Plan:  Has largely resolved since stopping bike riding. Recommend annual DRE AND PSA - will see back 12/2015 for annual screening, given 1st degree fhx. To call office for recurrent prostatitis symptoms.   [name] COPD (chronic obstructive pulmonary disease) ([place]) 06/12/2016  . Coronary artery disease involving native coronary artery of native heart with angina pectoris ([place]) 03/29/2017  . Cough 10/17/2016    Last Assessment & Plan:  Discussed typical course for acute viral illness. If symptoms worsen or fail to improve by 7-10d, delayed ATBs, fluids, rest, NSAIDs/APAP prn. Seek care if not improving. Needs earlier INR check due to ATBs.  [name] Dyspnea 02/01/2016   Last Assessment & Plan:  Overall improving, eval by pulm, plan for CT, neg stress test with cardiology. Recent switch to carvedilol due to side effects.  . Epidermoid cyst of skin 08/24/2017  . Essential hypertension 12/14/2015   Last Assessment & Plan:  Hypertension control: controlled  Medications: compliant Medication Management: as noted in orders Home blood pressure monitoring recommended additionally as needed for symptoms  The patient's care plan was reviewed and updated. Instructions and counseling were provided regarding patient goals and barriers. He was counseled to adopt a healthy lifestyle. Educational resources and self-management tools have been provided as charted in [facility] list.   . H/O maze procedure 03/29/2017  . [name] 03/29/2017   Overview:  2011  . Hx of CABG 03/29/2017  . Hyperlipidemia 03/29/2017  . Hypertensive heart disease 03/29/2017  . Kidney stones 07/23/2015   Overview:  x 3  Last Assessment & Plan:  By [place] has left nephrolithiasis, but not visible by KUB. Will check CT renal colic next year to assess both stone burden as well as to surveil AML.   . Leukocytoclastic vasculitis ([place]) 10/01/2017  . Localized edema 01/04/2018  . Lumbar radicular pain 01/19/2016  . Maculopapular rash 09/03/2017  . Nephrolithiasis 07/23/2015   Overview:  x 3  Last Assessment & Plan:  By [place] has left nephrolithiasis, but not visible by KUB. Will check CT renal colic next year to assess both stone burden as well as to surveil AML.   Overview:  x 3  Last Assessment & Plan:  Has 42m nonobstructing LUP stone - not visible by KUB.  Will check renal [place]8/2019 - he will contact office sooner if symptomatic.   . Non-sustained  ventricular tachycardia ([place] 03/29/2017  . Other hyperlipidemia 03/29/2017  . Palpitations 10/01/2017  . Paroxysmal atrial fibrillation ([place] 03/29/2017  . Paroxysmal atrial fibrillation (HC[place] 03/29/2017  . Pleural effusion, bilateral 08/09/2017  . Post-nasal drainage 02/25/2016   Last Assessment & Plan:  Trial zyrtec and flonase  . Prostate cancer screening 06/20/2017   Last Assessment & Plan:  Recommend continued annual CaP screening until within 10 years of life expectancy. Given good health and fhx of longevity, would anticipate CaP screening to continue until age 89  PSA today and again in one year on day of visit.  . Pulmonary hypertension (HW[place] 08/09/2017  . Pulmonary nodules 06/12/2016  . S/P AVR (aortic valve replacement) 03/20/2016  . Supratherapeutic INR 07/26/2017  . Syncope 03/29/2017  . Syncope and collapse 02/01/2016  . Typical atrial flutter ([place] 02/01/2016    Past Surgical History:  Procedure Laterality Date  . CHOLECYSTECTOMY    . CORONARY ARTERY BYPASS GRAFT    . EXPLORATORY LAPAROTOMY  07/30/2017  . FOOT SURGERY    . FRACTURE SURGERY Right    wrist and forearm  . HERNIA REPAIR    .  MECHANICAL AORTIC VALVE REPLACEMENT    . NASAL SINUS SURGERY    . RIGHT HEART CATH N/A 01/18/2018   Procedure: RIGHT HEART CATH;  Surgeon: Jolaine Artist, MD;  Location: [place] [facility];  Service: Cardiovascular;  Laterality: N/A;  . RIGHT HEART CATH N/A 05/14/2018   Procedure: RIGHT HEART CATH;  Surgeon: Jolaine Artist, MD;  Location: [place] [facility];  Service: Cardiovascular;  Laterality: N/A;  . TEE WITHOUT CARDIOVERSION N/A 05/21/2018   Procedure: TRANSESOPHAGEAL ECHOCARDIOGRAM (TEE);  Surgeon: Jolaine Artist, MD;  Location: [facility] ENDOSCOPY;  Service: Cardiovascular;  Laterality: N/A;  . UPPER GASTROINTESTINAL ENDOSCOPY  07/12/2017   Patchy areas of mucosal inflammation noted in the antrum with edema,erthema and ulcerations. Bx. Chronicfocally active gastritis.  [name] VASECTOMY       Family History  Problem Relation Age of Onset  . Asthma Mother   . Arthritis Mother   . Heart attack Father   . Hypertension Father   . Stroke Paternal Grandmother   . Colon cancer Neg Hx     Social History   Tobacco Use  . Smoking status: Former Smoker    Packs/day: 2.00    Years: 34.00    Pack years: 68.00    Types: Cigarettes    Last attempt to quit: 07/16/2000    Years since quitting: 17.8  . Smokeless tobacco: Never Used  Substance Use Topics  . Alcohol use: Yes  . Drug use: No    Current Outpatient Medications  Medication Sig Dispense Refill  . acetaminophen (TYLENOL) 650 MG CR tablet Take 650 mg by mouth every 8 (eight) hours as needed for pain.    [name] amoxicillin (AMOXIL) 500 MG capsule Take 2 capsules (1,000 mg total) by mouth every 8 (eight) hours for 7 days. 42 capsule 0  . aspirin EC 81 MG tablet Take 81 mg by mouth daily.    [name] atorvastatin (LIPITOR) 40 MG tablet Take 1 tablet (40 mg total) by mouth daily at 6 PM. 30 tablet 6  . B Complex-C (B-COMPLEX WITH VITAMIN C) tablet Take 1 tablet by mouth daily.     . budesonide (PULMICORT) 0.5 MG/2ML nebulizer solution Take 0.5 mg by nebulization daily as needed (for shortness of breath or wheezing).   1  . cetirizine (ZYRTEC ALLERGY) 10 MG tablet Take 10 mg by mouth daily as needed for allergies.     . Coenzyme Q10-Vitamin E (QUNOL ULTRA COQ10) 100-150 MG-UNIT CAPS Take 1 capsule by mouth every other day.    . esomeprazole (NEXIUM) 20 MG capsule Take 20 mg by mouth daily as needed (acid reflux).     . fluticasone (FLONASE) 50 MCG/ACT nasal spray Place 1-2 sprays into both nostrils as needed for allergies.     . Liniments (SALONPAS EX) Apply 1 patch topically daily as needed (pain).     . Macitentan (OPSUMIT) 10 MG TABS Take 1 tablet (10 mg total) by mouth daily. 30 tablet 11  . midodrine (PROAMATINE) 10 MG tablet Take 1 tablet (10 mg total) by mouth 3 (three) times daily with meals. 90 tablet 6  . multivitamin-lutein  (OCUVITE-LUTEIN) CAPS capsule Take 1 capsule by mouth daily.    [name] omeprazole (PRILOSEC) 20 MG capsule Take 20 mg by mouth daily.    [name] spironolactone (ALDACTONE) 25 MG tablet Take 0.5 tablets (12.5 mg total) by mouth daily. 45 tablet 3  . tadalafil, PAH, (ADCIRCA) 20 MG tablet Take 2 tablets (40 mg total) by mouth daily. [place]  tablet 11  . torsemide (DEMADEX) 20 MG tablet Take 3 tablets (60 mg total) by mouth daily. 90 tablet 6  . TUDORZA PRESSAIR 400 MCG/ACT AEPB Inhale 1 puff into the lungs 2 (two) times daily as needed (for shortness of breath or wheezing).   1  . warfarin (COUMADIN) 2.5 MG tablet Take 2.5-5 mg by mouth See admin instructions. Take 2.79m on all days except Saturday, take 539m     No current facility-administered medications for this visit.     Allergies  Allergen Reactions  . Amoxicillin-Pot Clavulanate Diarrhea    Stomach pain Has patient had a PCN reaction causing immediate rash, facial/tongue/throat swelling, SOB or lightheadedness with hypotension: No Has patient had a PCN reaction causing severe rash involving mucus membranes or skin necrosis: No Has patient had a PCN reaction that required hospitalization: No Has patient had a PCN reaction occurring within the last 10 years: Yes If all of the above answers are "NO", then may proceed with Cephalosporin use.   . Tape Rash and Other (See Comments)    Surgical tape    Review of Systems:  Constitutional: Denies fever, chills, diaphoresis, appetite change and fatigue.  HEENT: Denies photophobia, eye pain, redness, hearing loss, ear pain, congestion, sore throat, rhinorrhea, sneezing, mouth sores, neck pain, neck stiffness and tinnitus.   Respiratory: Denies SOB, DOE, cough, chest tightness,  and wheezing.   Cardiovascular: Denies chest pain, palpitations and leg swelling.  Genitourinary: Denies dysuria, urgency, frequency, hematuria, flank pain and difficulty urinating.  Musculoskeletal: Denies myalgias, back pain,  joint swelling, arthralgias and gait problem.  Skin: No rash.  Neurological: Denies dizziness, seizures, syncope, weakness, light-headedness, numbness and headaches.  Hematological: Denies adenopathy.  Has easy bruisability while on Coumadin. Psychiatric/Behavioral: Has anxiety or depression     Physical Exam:    BP 120/62   Pulse 64   Ht _0  (1.753 m)   Wt 145 lb (65.8 kg)   BMI 21.41 kg/m  Filed Weights   05/30/18 1145  Weight: 145 lb (65.8 kg)   Constitutional:  Well-developed, in no acute distress. Psychiatric: Normal mood and affect. Behavior is normal. HEENT: Pupils normal.  Conjunctivae are normal. No scleral icterus. Neck supple.  Cardiovascular: Normal rate,. No edema Pulmonary/chest: Bilateral decreased breath sounds. Abdominal: Soft, nondistended. Nontender. Bowel sounds active throughout. There are no masses palpable. No hepatomegaly. Rectal:  defered Neurological: Alert and oriented to person place and time. Skin: Skin is warm and dry. No rashes noted.  Data Reviewed: I have personally reviewed following labs and imaging studies  CBC: CBC Latest Ref Rng & Units 05/23/2018 05/22/2018 05/21/2018  WBC 4.0 - 10.5 K/uL 5.9 5.5 5.1  Hemoglobin 13.0 - 17.0 g/dL 12.9(L) 12.8(L) 12.1(L)  Hematocrit 39.0 - 52.0 % 40.0 39.5 37.1(L)  Platelets 150 - 400 K/uL 230 216 214    CMP: CMP Latest Ref Rng & Units 05/23/2018 05/22/2018 05/21/2018  Glucose 65 - 99 mg/dL 87 90 101(H)  BUN 6 - 20 mg/dL 26(H) 28(H) 29(H)  Creatinine 0.61 - 1.24 mg/dL 1.30(H) 1.43(H) 1.42(H)  Sodium 135 - 145 mmol/L 136 135 132(L)  Potassium 3.5 - 5.1 mmol/L 3.6 3.6 3.7  Chloride 101 - 111 mmol/L 95(L) 93(L) 94(L)  CO2 22 - 32 mmol/L _1 Calcium 8.9 - 10.3 mg/dL 9.5 9.5 9.0  Total Protein 6.5 - 8.1 g/dL - - -  Total Bilirubin 0.3 - 1.2 mg/dL - - -  Alkaline Phos 38 - 126 U/L - - -  AST 15 - 41 U/L - - -  ALT 17 - 63 U/L - - -   Hepatic Function Latest Ref Rng & Units 05/18/2018 05/14/2018    Total Protein 6.5 - 8.1 g/dL 7.3 7.7  Albumin 3.5 - 5.0 g/dL 2.9(L) 3.0(L)  AST 15 - 41 U/L 39 37  ALT 17 - 63 U/L 13(L) 14(L)  Alk Phosphatase 38 - 126 U/L 69 78  Total Bilirubin 0.3 - 1.2 mg/dL 2.5(H) 4.8(H)  Bilirubin, Direct 0.1 - 0.5 mg/dL 1.1(H) -       Radiology Studies:  Ct Chest High Resolution  Result Date: 05/16/2018 IMPRESSION: 1. Slightly irregular 0.9 cm peripheral right middle lobe solid pulmonary nodule. 2. minimal associated traction bronchiolectasis. 3. Mild cardiomegaly. Minimal interlobular septal thickening and trace dependent bilateral pleural effusions. These findings suggest a mild component of congestive heart failure. 4. Small to moderate volume perihepatic ascites. 5. Ectatic 4.3 cm ascending thoracic aorta. 6. Left main and 3 vessel coronary atherosclerosis. Aortic Atherosclerosis (ICD10-I70.0) and Emphysema (ICD10-J43.9). Electronically Signed   By: Ilona Sorrel M.D.   On: 05/16/2018 16:11    US Liver Doppler:  Result Date: 05/16/2018 CLINICAL DATA:  Hepatic cirrhosis. EXAM: DUPLEX ULTRASOUND OF LIVER TECHNIQUE: Color and duplex Doppler ultrasound was performed to evaluate the hepatic in-flow and out-flow vessels. COMPARISON:  None. FINDINGS: Portal Vein Velocities Main:  26 cm/sec Right:  14 cm/sec Left:  18 cm/sec Normal hepatopetal flow is noted in the portal veins. Hepatic Vein Velocities Right:  40 cm/sec Middle:  30 cm/sec Left:  32 cm/sec Normal hepatofugal flow is noted in the hepatic veins. Hepatic Artery Velocity:  65 cm/sec Splenic Vein Velocity:  33.4 cm/sec Varices: Absent. Ascites: Present. Superior mesenteric vein velocity: 30 centimeters/second IMPRESSION: No Doppler evidence of hepatic, splenic or portal venous thrombosis or occlusion. Mild ascites is noted. Electronically Signed   By: Marijo Conception, M.D.   On: 05/16/2018 13:47   US Abdomen Limited Ruq:  Result Date: 05/16/2018 CLINICAL DATA:  Hepatic cirrhosis. EXAM: ULTRASOUND ABDOMEN  LIMITED RIGHT UPPER QUADRANT COMPARISON:  None. FINDINGS: Gallbladder: Status post cholecystectomy. Common bile duct: Diameter: 3.5 mm which is within normal limits. Liver: No focal lesion identified. Mildly heterogeneous echotexture of hepatic parenchyma is noted with nodular hepatic contours suggesting cirrhosis. Mild ascites is noted. Portal vein is patent on color Doppler imaging with normal direction of blood flow towards the liver. IMPRESSION: Status post cholecystectomy. Findings consistent with hepatic cirrhosis. Mild ascites is noted around the liver. No focal abnormality is noted. Electronically Signed   By: Marijo Conception, M.D.   On: 05/16/2018 13:32      Carmell Austria, MD 05/30/2018, 11:52 AM  Cc: Townsend Roger, MD

## 2018-06-02 NOTE — Progress Notes (Signed)
ADVANCED HF CLINIC NOTE   Date:  06/03/2018   ID:  Lance Hicks, DOB 22-Oct-1946, MRN [id number]  PCP:  Lance Roger, MD  Cardiologist:  Dr. 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 Also has COPD (quit smoking in 2002 - 1 ppd x 25 years), HL, and HTN.  He moved from [place] about 2 years ago and has been followed by Pulmonary and Cardiology art [facility]. (I have reviewed these records in [place])  In 8/18 admitted for acute pancreatitis. Underwent lap cholecystectomy on 07/29/2017. On 8/6 hedeveloped hypotension and was taken back for open laparotomy due to bleeding from an unclear omental source.He was given fluids and 8 units PRBC perioperatively. He says his breathing has been bad since that time.   Subsequently saw pulmonogist as [place]. Ha hi-res CT, PFTs and VQ (low prob). Felt to have Golds I COPD with asthma overlap and early pulmonary fibrosis with PAH. Thought felt pulmonary pressures may have been elevated due to recent illness. Referred to Rheumatology though appt not until May. Suggested possible RHC.   He was also seen by Cardiology. Echocardiogram 8/18 as below showed normal LV function with septal flattening and RVSP 65-15mHG. There was no comment on RV size or function. 14-day event monitor showed 10-beat run NSVT. Felt to have chronic AF and PAH. No further testing ordered.  Underwent RHC on 01/18/18 with severe PAH and cor pulmonale. Macitentan and Adcirca. Has started macitentan as he got the first 30 days approved. Has not been able to afford tadalafil or the f/u prescription for macitentan.   Admitted 5/21-5/30/19 with symptomatic hypotension and weight gain. He had a repeat RHC and echo with bubble study  (results below). He was started on midodrine for BP support. Diuresed 25 lbs with IV lasix, then transitioned to torsemide 60 mg daily. Abdominal UKoreashowed cirrhosis with no evidence of portal HTN. Pulm consulted for possible ILD on High-res chest CT. Ortho consulted for right rotator cuff tear from horse accident PTA. He was treated with antibiotics for Bacteremia. DC weight: 140 lbs.   He presents today for post hospital follow up. Overall doing well. He is working out daily on the treadmill 35 minutes and riding stationary bike for 6 minutes. Denies SOB, orthopnea, PND, edema. No CP, dizziness, or palpitations. Denies fever or chills. Denies bleeding. He reports that his blood pressure is lower in the evenings, but ran into Dr BHaroldine Lawsin the hallway today, who has instructed him to take 15 mg midodrine at night. Pt has seen ortho, who says his rotator cuff tear is inoperable and he has started outpatient PT. Weights ~140 lbs at home. Taking all medications. Limiting fluid and salt intake.   Review of systems complete and found to be negative unless listed in HPI.   Studies:  RHC 5/21/19showed Mild/Moderate PAH in setting of high output with no evidence of intracardiac shunting. Hemodynamics as below.   High-Res Chest CT 05/16/18 1. Slightly irregular 0.9 cm peripheral right middle lobe solid pulmonary nodule. 3 month follow up recommended.  2. Dependent basilar predominant patchy subpleural reticulation and ground-glass attenuation with the suggestion of minimal associated traction bronchiolectasis. No frank honeycombing. Findings may indicate  an interstitial lung disease such as early usual interstitial pneumonia (UIP) or fibrotic phase nonspecific interstitial pneumonia (NSIP). Suggest a follow-up high-resolution chest CT study in 6-12 months to assess temporal pattern stability, as clinically warranted. 3. Mild cardiomegaly. Minimal interlobular septal thickening and trace dependent  bilateral pleural effusions.  4. Small to moderate volume perihepatic ascites. 5. Ectatic 4.3 cm ascending thoracic aorta. Recommend annual imaging followup by CTA or MRA.  6. Left main and 3 vessel coronary atherosclerosis.  US Abdomen RUQ 05/16/18 - s/p cholecystectomy. + hepatic cirrhosis. Mild ascites.   Liver Doppler 05/16/18 - No hepatic, splenic, or portal venous thrombosis or occlusion. Mild ascites.  Echo with bubble study 05/16/18 LVEF 55-60% with "late bubbles" , Mechanical AVR stable with trivial perivalvular regurg, Mild MR, Severe LAE, Severe RV dilation and reduced function. No PFO. Mod TR, PA peak pressure 68 mm Hg  [place] numbers5/24/19: PA 60-19 (32) CVP ~10 CO 5.4 CI 2.9  RHC 05/14/18 RA = 18 RV = 71/17 PA = 69/24 (39) PCW = 20 Fick cardiac output/index = 7.0/3.7 Thermo CO/CI = 7.2/3.8 PVR = 2.6 WU Ao sat = 97% PA sat = 73%, 73% SVC sat = 73%  RA sat = 72%  RHC 01/18/18 RA = 23 RV = 84/21 PA = 81/38 (53) PCW = 22 Fick cardiac output/index = 3.0/1.6 PVR = 10.4 WU FA sat = 98% on 2L  (checked on RA = 93%) PA sat = 60%, 61% SVC sat = 62%   Echo 8/18  1. LVEF is estimated to be 55%. 2. There is septal flattening consistent with right ventricular pressure and or volume overload. 3. A mechanical prosthetic aortic valve is present with mild AI 4. There is moderate to severe tricuspid regurgitation. 5. There is a trivial amount of pulmonic regurgitation. 6. The right ventricular systolic pressure is calculated at 65-12mHg. 7. The inferior vena cava is dilated with <50% inspiratory collapse, suggestive of an elevated right atrial pressure (126mg).  14 Day Patch 10/01/2017-10/15/2017:  -1 run of Ventricular Tachycardia occurred lasting 10 beats with a max rate of 240 bpm (avg 161 bpm). -Atrial Flutter occurred continuously (100% burden), ranging from 55-132 bpm (avg of 87 bpm).   NM Ventilation and Perfusion Lung Scan 10/31/2017:  Low  probability of pulmonary embolism, using the PIOPED criteria.  Lower extremity venous duplex 07/25/2017:  1. Low probability of acute deep vein thrombosis in bilateral lower extremities. 2. Calf veins are poorly visualized but appear patent in limited segments visualized.   PFTs 07/02/2017:  FVC86% FEV185% FEV1/FVC 73% DLCO8.2, 32%   6MW -1400 feet; 97% to 85% (recovered with resting)   CT Chest07/08/2017: 1. Stable right middle lobe pulmonary nodule measuring up to 1 cm. 2. Minimal subpleural reticulation without evidence of honeycombing to  suggest pulmonary fibrosis. Stable centrilobular emphysema. 3. Mediastinal lymphadenopathy, increased from prior. 4. Calcified right hilar lymph nodes and multiple calcified granulomas  throughout the right lung consistent with prior granulomatous disease. 5. Ascending thoracic aortic aneurysm measuring up to 4.5 cm, unchanged. 6. Dilatation of the main pulmonary artery suggestive of pulmonary  hypertension. 7. Cardiomegaly and dense atherosclerotic coronary artery calcifications.    Past Medical History:  Diagnosis Date  . 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 In light of concurrent left nephrolithiasis, will check CT renal colic next year instead of renal [place]  . Marland Kitchennticoagulated on Coumadin 01/04/2018  . Anxiety 05/10/2016  Last Assessment & Plan:  Doing well off of zoloft.  . Asymptomatic microscopic hematuria 06/20/2017   Last Assessment & Plan:  Had hematuria workup in [facility] in 2016 which negative CT and cystoscopy. UA with 2+ blood last visit - we discussed recommendation for repeat workup at 5 years or if degree of hematuria progresses.   . Atrial fibrillation ([place]) 03/29/2017  . Atrial flutter ([place]) 03/29/2017  . Chronic allergic rhinitis 04/28/2016   Last Assessment & Plan:  Continue astelin  . Chronic anticoagulation  03/29/2017  . Chronic atrial fibrillation ([place]) 07/23/2015   Last Assessment & Plan:  Coumadin and metoprolol, cardiology referral to establish care.  . Chronic midline back pain 12/14/2015   Last Assessment & Plan:  Pain management referral for further evaluation.  . Chronic prostatitis 07/23/2015   Last Assessment & Plan:  Has largely resolved since stopping bike riding. Recommend annual DRE AND PSA - will see back 12/2015 for annual screening, given 1st degree fhx. To call office for recurrent prostatitis symptoms.   [name] COPD (chronic obstructive pulmonary disease) ([place]) 06/12/2016  . Coronary artery disease involving native coronary artery of native heart with angina pectoris ([place]) 03/29/2017  . Cough 10/17/2016   Last Assessment & Plan:  Discussed typical course for acute viral illness. If symptoms worsen or fail to improve by 7-10d, delayed ATBs, fluids, rest, NSAIDs/APAP prn. Seek care if not improving. Needs earlier INR check due to ATBs.  [name] Dyspnea 02/01/2016   Last Assessment & Plan:  Overall improving, eval by pulm, plan for CT, neg stress test with cardiology. Recent switch to carvedilol due to side effects.  . Epidermoid cyst of skin 08/24/2017  . Essential hypertension 12/14/2015   Last Assessment & Plan:  Hypertension control: controlled  Medications: compliant Medication Management: as noted in orders Home blood pressure monitoring recommended additionally as needed for symptoms  The patient's care plan was reviewed and updated. Instructions and counseling were provided regarding patient goals and barriers. He was counseled to adopt a healthy lifestyle. Educational resources and self-management tools have been provided as charted in [facility] list.   . H/O maze procedure 03/29/2017  . [name] 03/29/2017   Overview:  2011  . Hx of CABG 03/29/2017  . Hyperlipidemia 03/29/2017  . Hypertensive heart disease 03/29/2017  . Kidney stones 07/23/2015   Overview:  x 3  Last Assessment &  Plan:  By [place] has left nephrolithiasis, but not visible by KUB. Will check CT renal colic next year to assess both stone burden as well as to surveil AML.   . Leukocytoclastic vasculitis ([place]) 10/01/2017  . Localized edema 01/04/2018  . Lumbar radicular pain 01/19/2016  . Maculopapular rash 09/03/2017  . Nephrolithiasis 07/23/2015   Overview:  x 3  Last Assessment & Plan:  By [place] has left nephrolithiasis, but not visible by KUB. Will check CT renal colic next year to assess both stone burden as well as to surveil AML.   Overview:  x 3  Last Assessment & Plan:  Has 18m nonobstructing LUP stone - not visible by KUB.  Will check renal [place]/2019 - he will contact office sooner if symptomatic.   . Non-sustained ventricular tachycardia ([place] 03/29/2017  . Other hyperlipidemia 03/29/2017  . Palpitations 10/01/2017  . Paroxysmal atrial fibrillation ([place] 03/29/2017  . Paroxysmal atrial fibrillation ([place] 03/29/2017  . Pleural effusion, bilateral 08/09/2017  . Post-nasal drainage 02/25/2016   Last Assessment & Plan:  Trial zyrtec and flonase  .  Prostate cancer screening 06/20/2017   Last Assessment & Plan:  Recommend continued annual CaP screening until within 10 years of life expectancy. Given good health and fhx of longevity, would anticipate CaP screening to continue until age 53.  PSA today and again in one year on day of visit.  . Pulmonary hypertension ([place]) 08/09/2017  . Pulmonary nodules 06/12/2016  . S/P AVR (aortic valve replacement) 03/20/2016  . Supratherapeutic INR 07/26/2017  . Syncope 03/29/2017  . Syncope and collapse 02/01/2016  . Typical atrial flutter ([place]) 02/01/2016    Past Surgical History:  Procedure Laterality Date  . CHOLECYSTECTOMY    . CORONARY ARTERY BYPASS GRAFT    . EXPLORATORY LAPAROTOMY  07/30/2017  . FOOT SURGERY    . FRACTURE SURGERY Right    wrist and forearm  . HERNIA REPAIR    . MECHANICAL AORTIC VALVE REPLACEMENT    . NASAL SINUS SURGERY    . RIGHT HEART CATH N/A 01/18/2018    Procedure: RIGHT HEART CATH;  Surgeon: Jolaine Artist, MD;  Location: [place] [facility];  Service: Cardiovascular;  Laterality: N/A;  . RIGHT HEART CATH N/A 05/14/2018   Procedure: RIGHT HEART CATH;  Surgeon: Jolaine Artist, MD;  Location: [place] [facility];  Service: Cardiovascular;  Laterality: N/A;  . TEE WITHOUT CARDIOVERSION N/A 05/21/2018   Procedure: TRANSESOPHAGEAL ECHOCARDIOGRAM (TEE);  Surgeon: Jolaine Artist, MD;  Location: [facility] ENDOSCOPY;  Service: Cardiovascular;  Laterality: N/A;  . UPPER GASTROINTESTINAL ENDOSCOPY  07/12/2017   Patchy areas of mucosal inflammation noted in the antrum with edema,erthema and ulcerations. Bx. Chronicfocally active gastritis.  [name] VASECTOMY      Current Medications: Current Meds  Medication Sig  . acetaminophen (TYLENOL) 650 MG CR tablet Take 650 mg by mouth every 8 (eight) hours as needed for pain.  [name] aspirin EC 81 MG tablet Take 81 mg by mouth daily.  [name] atorvastatin (LIPITOR) 40 MG tablet Take 1 tablet (40 mg total) by mouth daily at 6 PM.  . B Complex-C (B-COMPLEX WITH VITAMIN C) tablet Take 1 tablet by mouth daily.   . budesonide (PULMICORT) 0.5 MG/2ML nebulizer solution Take 0.5 mg by nebulization daily as needed (for shortness of breath or wheezing).   . cetirizine (ZYRTEC ALLERGY) 10 MG tablet Take 10 mg by mouth daily as needed for allergies.   . Coenzyme Q10-Vitamin E (QUNOL ULTRA COQ10) 100-150 MG-UNIT CAPS Take 1 capsule by mouth every other day.  . esomeprazole (NEXIUM) 20 MG capsule Take 20 mg by mouth daily as needed (acid reflux).   . fluticasone (FLONASE) 50 MCG/ACT nasal spray Place 1-2 sprays into both nostrils as needed for allergies.   . Liniments (SALONPAS EX) Apply 1 patch topically daily as needed (pain).   . Macitentan (OPSUMIT) 10 MG TABS Take 1 tablet (10 mg total) by mouth daily.  . midodrine (PROAMATINE) 10 MG tablet Take 1 tablet (10 mg total) by mouth 3 (three) times daily with meals.  . multivitamin-lutein  (OCUVITE-LUTEIN) CAPS capsule Take 1 capsule by mouth daily.  [name] omeprazole (PRILOSEC) 20 MG capsule Take 20 mg by mouth daily.  . pantoprazole (PROTONIX) 40 MG tablet Take 1 tablet (40 mg total) by mouth daily.  [name] spironolactone (ALDACTONE) 25 MG tablet Take 0.5 tablets (12.5 mg total) by mouth daily.  . tadalafil, PAH, (ADCIRCA) 20 MG tablet Take 2 tablets (40 mg total) by mouth daily.  [name] torsemide (DEMADEX) 20 MG tablet Take 3 tablets (60 mg total) by mouth  daily.  . TUDORZA PRESSAIR 400 MCG/ACT AEPB Inhale 1 puff into the lungs 2 (two) times daily as needed (for shortness of breath or wheezing).   . warfarin (COUMADIN) 2.5 MG tablet Take 2.5-5 mg by mouth See admin instructions. Take 2.21m on all days except Saturday, take 523m     Allergies:   Amoxicillin-pot clavulanate and Tape   Social History   Socioeconomic History  . Marital status: Single    Spouse name: Not on file  . Number of children: Not on file  . Years of education: Not on file  . Highest education level: Not on file  Occupational History  . Not on file  Social Needs  . Financial resource strain: Not on file  . Food insecurity:    Worry: Not on file    Inability: Not on file  . Transportation needs:    Medical: Not on file    Non-medical: Not on file  Tobacco Use  . Smoking status: Former Smoker    Packs/day: 2.00    Years: 34.00    Pack years: 68.00    Types: Cigarettes    Last attempt to quit: 07/16/2000    Years since quitting: 17.8  . Smokeless tobacco: Never Used  Substance and Sexual Activity  . Alcohol use: Yes  . Drug use: No  . Sexual activity: Not on file  Lifestyle  . Physical activity:    Days per week: Not on file    Minutes per session: Not on file  . Stress: Not on file  Relationships  . Social connections:    Talks on phone: Not on file    Gets together: Not on file    Attends religious service: Not on file    Active member of club or organization: Not on file    Attends meetings  of clubs or organizations: Not on file    Relationship status: Not on file  Other Topics Concern  . Not on file  Social History Narrative  . Not on file     Family History: The patient's family history includes Arthritis in his mother; Asthma in his mother; Heart attack in his father; Hypertension in his father; Stroke in his paternal grandmother. There is no history of Colon cancer.   Recent Labs: 05/14/2018: B Natriuretic Peptide 1,247.0 05/15/2018: TSH 2.768 05/16/2018: Magnesium 1.8 05/18/2018: ALT 13 05/23/2018: BUN 26; Creatinine, Ser 1.30; Hemoglobin 12.9; Platelets 230; Potassium 3.6; Sodium 136  Recent Lipid Panel No results found for: CHOL, TRIG, HDL, CHOLHDL, VLDL, LDLCALC, LDLDIRECT  Physical Exam:    VS:  BP 122/78   Pulse 86   Wt 146 lb (66.2 kg)   SpO2 96%   BMI 21.56 kg/m     Wt Readings from Last 3 Encounters:  06/03/18 146 lb (66.2 kg)  05/30/18 145 lb (65.8 kg)  05/23/18 140 lb 1.6 oz (63.5 kg)    General: No resp difficulty. HEENT: Normal Neck: Supple. JVP 7-8. Carotids 2+ bilat; no bruits. No thyromegaly or nodule noted. Cor: PMI nondisplaced. IRR, +RV lift, 2/6 TR, loud P2 Lungs: CTAB, normal effort. Abdomen: Soft, non-tender, non-distended, no HSM. No bruits or masses. +BS  Extremities: No cyanosis, clubbing, or rash. R and LLE trace edema.  Neuro: Alert & orientedx3, cranial nerves grossly intact. moves all 4 extremities w/o difficulty. Affect pleasant   ASSESSMENT/PLAN:    1. Pulmonary hypertension with cor pulmonale - Echo with bubble study 05/16/18 LVEF 55-60% with "late bubbles" , Mechanical AVR stable with  trivial perivalvular regurg, Mild MR, Severe LAE, Severe RV dilation and reduced function. No PFO. Mod TR, PA peak pressure 68 mm Hg - RHC 1/19 with mod/severe pulm HTN with RV failure.   - RHC 05/14/18 with Mild/Moderate PAH in setting of high output with no evidence of intracardiac shunting. - Ab u/s with cirrhosis but no evidence of  portal HTN - Unifying etiology remains unclear suspect it is a combination of cirrhosis and peripheral shunt physiology. - Volume status stable on exam. Continue torsemide 60 mg per day. Encouraged him to take an extra torsemide as needed for weight gain/swelling.  - Continue adcirca 40 mg daily.  - Continue macitentan 10 mg daily.  - Increase midodrine 10 mg am and afternoon, 15 mg at night - Auto-immune serologies negative (checked twice) - ? HHT/shunt/AVMs with late bubbles on bubble study. - Refer to pulmonary rehab  - Encouraged him to wear TED hose. New prescription provided.   2. Chronic respiratory failure - [place] CT 05/16/18 with "dependent basilar predominant patchy subpleural reticulation and ground-glass attenuation with the suggestion of minimal associated traction bronchiolectasis. No frank honeycombing. Findings may indicate an interstitial lung disease such as early usual interstitial pneumonia (UIP) or fibrotic phase nonspecific interstitial pneumonia (NSIP)." - He has follow up with pulmonology later this month  3. Chronic AFL - Rate-controlled. Now off metoprolol. Continue coumadin with mechanical AVR.   4. CAD - s/p CABG.  - No s/s ischemia - continue ASA and statin  5. S/p mechanical AVR - stable on most recent echo. Continue coumadin/ASA 81.  - aware of need for SBE prophylaxis  6. Leukocytoclastic vasculitis - f/u with Rheumatology. No change.  7. Cirrhosis - US Abdomen RUQ 05/16/18 - s/p cholecystectomy. + hepatic cirrhosis. Mild ascites.  - Liver Doppler 05/16/18 - No hepatic, splenic, or portal venous thrombosis or occlusion. Mild ascites. - Increase midodrine 10 mg am, afternoon, 15 mg HS  8. R Shoulder pain with rotator cuff tear - Following with ortho - Continue outpatient PT  9. Recent bacteremia - Denies fever/chills. Finished antibiotics 6/6 - Check CBC today  Increase midodrine to 10 mg am and pm, 15 mg qHS Refer to pulmonary  rehab CBC, BMET today TED hose Follow up in 4 weeks with Dr Haroldine Laws  Georgiana Shore, NP  2:10 PM  Greater than 50% of the 25 minute visit was spent in counseling/coordination of care regarding disease state education, salt/fluid restriction, sliding scale diuretics, and medication compliance.

## 2018-06-03 ENCOUNTER — Ambulatory Visit (HOSPITAL_COMMUNITY)
Admit: 2018-06-03 | Discharge: 2018-06-03 | Disposition: A | Discharge status: Home / Self Care | Payer: Medicare Other | Attending: Internal Medicine | Admitting: Internal Medicine

## 2018-06-03 ENCOUNTER — Encounter (HOSPITAL_COMMUNITY): Payer: Self-pay

## 2018-06-03 VITALS — BP 122/78 | HR 86 | Wt 146.0 lb

## 2018-06-03 DIAGNOSIS — Z87891 Personal history of nicotine dependence: Secondary | ICD-10-CM | POA: Diagnosis not present

## 2018-06-03 DIAGNOSIS — I272 Pulmonary hypertension, unspecified: Secondary | ICD-10-CM

## 2018-06-03 DIAGNOSIS — I4892 Unspecified atrial flutter: Secondary | ICD-10-CM | POA: Diagnosis not present

## 2018-06-03 DIAGNOSIS — M25511 Pain in right shoulder: Secondary | ICD-10-CM | POA: Diagnosis not present

## 2018-06-03 DIAGNOSIS — Z952 Presence of prosthetic heart valve: Secondary | ICD-10-CM | POA: Diagnosis not present

## 2018-06-03 DIAGNOSIS — R188 Other ascites: Secondary | ICD-10-CM | POA: Diagnosis not present

## 2018-06-03 DIAGNOSIS — J90 Pleural effusion, not elsewhere classified: Secondary | ICD-10-CM | POA: Insufficient documentation

## 2018-06-03 DIAGNOSIS — M75101 Unspecified rotator cuff tear or rupture of right shoulder, not specified as traumatic: Secondary | ICD-10-CM | POA: Diagnosis not present

## 2018-06-03 DIAGNOSIS — I119 Hypertensive heart disease without heart failure: Secondary | ICD-10-CM | POA: Diagnosis not present

## 2018-06-03 DIAGNOSIS — M5416 Radiculopathy, lumbar region: Secondary | ICD-10-CM | POA: Diagnosis not present

## 2018-06-03 DIAGNOSIS — J9610 Chronic respiratory failure, unspecified whether with hypoxia or hypercapnia: Secondary | ICD-10-CM | POA: Insufficient documentation

## 2018-06-03 DIAGNOSIS — Z79899 Other long term (current) drug therapy: Secondary | ICD-10-CM | POA: Diagnosis not present

## 2018-06-03 DIAGNOSIS — I2699 Other pulmonary embolism without acute cor pulmonale: Secondary | ICD-10-CM | POA: Diagnosis not present

## 2018-06-03 DIAGNOSIS — I517 Cardiomegaly: Secondary | ICD-10-CM | POA: Insufficient documentation

## 2018-06-03 DIAGNOSIS — I483 Typical atrial flutter: Secondary | ICD-10-CM | POA: Diagnosis not present

## 2018-06-03 DIAGNOSIS — I2729 Other secondary pulmonary hypertension: Secondary | ICD-10-CM | POA: Insufficient documentation

## 2018-06-03 DIAGNOSIS — F419 Anxiety disorder, unspecified: Secondary | ICD-10-CM | POA: Insufficient documentation

## 2018-06-03 DIAGNOSIS — K7460 Unspecified cirrhosis of liver: Secondary | ICD-10-CM | POA: Insufficient documentation

## 2018-06-03 DIAGNOSIS — R7881 Bacteremia: Secondary | ICD-10-CM | POA: Insufficient documentation

## 2018-06-03 DIAGNOSIS — E7849 Other hyperlipidemia: Secondary | ICD-10-CM | POA: Diagnosis not present

## 2018-06-03 DIAGNOSIS — Z7901 Long term (current) use of anticoagulants: Secondary | ICD-10-CM | POA: Diagnosis not present

## 2018-06-03 DIAGNOSIS — I2781 Cor pulmonale (chronic): Secondary | ICD-10-CM | POA: Diagnosis not present

## 2018-06-03 DIAGNOSIS — I25119 Atherosclerotic heart disease of native coronary artery with unspecified angina pectoris: Secondary | ICD-10-CM | POA: Insufficient documentation

## 2018-06-03 DIAGNOSIS — Z951 Presence of aortocoronary bypass graft: Secondary | ICD-10-CM | POA: Insufficient documentation

## 2018-06-03 DIAGNOSIS — I482 Chronic atrial fibrillation: Secondary | ICD-10-CM | POA: Diagnosis not present

## 2018-06-03 DIAGNOSIS — I350 Nonrheumatic aortic (valve) stenosis: Secondary | ICD-10-CM | POA: Diagnosis not present

## 2018-06-03 DIAGNOSIS — J449 Chronic obstructive pulmonary disease, unspecified: Secondary | ICD-10-CM | POA: Insufficient documentation

## 2018-06-03 DIAGNOSIS — Z87898 Personal history of other specified conditions: Secondary | ICD-10-CM

## 2018-06-03 DIAGNOSIS — Z7982 Long term (current) use of aspirin: Secondary | ICD-10-CM | POA: Diagnosis not present

## 2018-06-03 DIAGNOSIS — M310 Hypersensitivity angiitis: Secondary | ICD-10-CM | POA: Insufficient documentation

## 2018-06-03 LAB — BASIC METABOLIC PANEL
Anion gap: 8 (ref 5–15)
BUN: 57 mg/dL — ABNORMAL HIGH (ref 6–20)
CO2: 29 mmol/L (ref 22–32)
Calcium: 9.6 mg/dL (ref 8.9–10.3)
Chloride: 101 mmol/L (ref 101–111)
Creatinine, Ser: 1.82 mg/dL — ABNORMAL HIGH (ref 0.61–1.24)
GFR calc Af Amer: 41 mL/min — ABNORMAL LOW (ref >60)
GFR calc non Af Amer: 36 mL/min — ABNORMAL LOW (ref >60)
Glucose, Bld: 106 mg/dL — ABNORMAL HIGH (ref 65–99)
Potassium: 4.8 mmol/L (ref 3.5–5.1)
Sodium: 138 mmol/L (ref 135–145)

## 2018-06-03 LAB — CBC
HCT: 36.7 % — ABNORMAL LOW (ref 39.0–52.0)
Hemoglobin: 11.8 g/dL — ABNORMAL LOW (ref 13.0–17.0)
MCH: 27.4 pg (ref 26.0–34.0)
MCHC: 32.2 g/dL (ref 30.0–36.0)
MCV: 85.2 fL (ref 78.0–100.0)
Platelets: 246 10*3/uL (ref 150–400)
RBC: 4.31 MIL/uL (ref 4.22–5.81)
RDW: 17.7 % — ABNORMAL HIGH (ref 11.5–15.5)
WBC: 6.5 10*3/uL (ref 4.0–10.5)

## 2018-06-03 NOTE — Patient Instructions (Signed)
Labs today We will only contact you if something comes back abnormal or we need to make some changes. Otherwise no news is good news!  You have been referred to Pulmonary Rehab, they will be in contact with you regarding orientation.  Your physician recommends that you schedule a follow-up appointment in: 4 weeks with Dr Haroldine Laws   Do the following things EVERYDAY: 1) Weigh yourself in the morning before breakfast. Write it down and keep it in a log. 2) Take your medicines as prescribed 3) Eat low salt foods-Limit salt (sodium) to 2000 mg per day.  4) Stay as active as you can everyday 5) Limit all fluids for the day to less than 2 liters

## 2018-06-04 ENCOUNTER — Other Ambulatory Visit: Payer: Self-pay

## 2018-06-04 ENCOUNTER — Ambulatory Visit: Payer: Medicare Other | Attending: Cardiology | Admitting: Physical Therapy

## 2018-06-04 ENCOUNTER — Encounter: Payer: Self-pay | Admitting: Physical Therapy

## 2018-06-04 ENCOUNTER — Telehealth (HOSPITAL_COMMUNITY): Payer: Self-pay | Admitting: Cardiology

## 2018-06-04 VITALS — BP 118/75 | HR 62

## 2018-06-04 DIAGNOSIS — R29898 Other symptoms and signs involving the musculoskeletal system: Secondary | ICD-10-CM | POA: Insufficient documentation

## 2018-06-04 DIAGNOSIS — M6281 Muscle weakness (generalized): Secondary | ICD-10-CM | POA: Diagnosis not present

## 2018-06-04 DIAGNOSIS — M25611 Stiffness of right shoulder, not elsewhere classified: Secondary | ICD-10-CM | POA: Insufficient documentation

## 2018-06-04 DIAGNOSIS — I272 Pulmonary hypertension, unspecified: Secondary | ICD-10-CM

## 2018-06-04 DIAGNOSIS — M25511 Pain in right shoulder: Secondary | ICD-10-CM

## 2018-06-04 NOTE — Telephone Encounter (Signed)
-----  Message from Georgiana Shore, NP sent at 06/04/2018  9:30 AM EDT ----- Creatinine is up. Please call and have him hold torsemide for 2 days. If weight increases or he has symptoms of volume overload, have him call clinic. Recheck BMET in 1 week.

## 2018-06-04 NOTE — Therapy (Signed)
[facility] [address]  [place], [place], [address] Phone: [phone]   Fax:  [phone]  Physical Therapy Evaluation  Patient Details  Name: Lance Hicks MRN: [id number] Date of Birth: 02/13/1946 Referring Provider: Dr. Stann Mainland   Encounter Date: 06/04/2018  PT End of Session - 06/04/18 1507    Visit Number  1    Number of Visits  17    Date for PT Re-Evaluation  07/30/18    Authorization Type  Medicare [place] [facility]    PT Start Time  1318    PT Stop Time  1405    PT Time Calculation (min)  47 min    Activity Tolerance  Patient tolerated treatment well    Behavior During Therapy  [facility] for tasks assessed/performed       Past Medical History:  Diagnosis Date  . 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 In light of concurrent left nephrolithiasis, will check CT renal colic next year instead of renal US.   [name] Anticoagulated on Coumadin 01/04/2018  . Anxiety 05/10/2016   Last Assessment & Plan:  Doing well off of zoloft.  . Asymptomatic microscopic hematuria 06/20/2017   Last Assessment & Plan:  Had hematuria workup in [facility] in 2016 which negative CT and cystoscopy. UA with 2+ blood last visit - we discussed recommendation for repeat workup at 5 years or if degree of hematuria progresses.   . Atrial fibrillation ([place]) 03/29/2017  . Atrial flutter ([place]) 03/29/2017  . Chronic allergic rhinitis 04/28/2016   Last Assessment & Plan:  Continue astelin  . Chronic anticoagulation 03/29/2017  . Chronic atrial fibrillation ([place]) 07/23/2015   Last Assessment & Plan:  Coumadin and metoprolol, cardiology referral to establish care.  . Chronic midline back pain 12/14/2015   Last Assessment & Plan:  Pain management referral for further evaluation.  . Chronic prostatitis 07/23/2015   Last Assessment & Plan:  Has largely resolved since stopping bike riding. Recommend annual DRE AND  PSA - will see back 12/2015 for annual screening, given 1st degree fhx. To call office for recurrent prostatitis symptoms.   [name] COPD (chronic obstructive pulmonary disease) ([place]) 06/12/2016  . Coronary artery disease involving native coronary artery of native heart with angina pectoris ([place]) 03/29/2017  . Cough 10/17/2016   Last Assessment & Plan:  Discussed typical course for acute viral illness. If symptoms worsen or fail to improve by 7-10d, delayed ATBs, fluids, rest, NSAIDs/APAP prn. Seek care if not improving. Needs earlier INR check due to ATBs.  [name] Dyspnea 02/01/2016   Last Assessment & Plan:  Overall improving, eval by pulm, plan for CT, neg stress test with cardiology. Recent switch to carvedilol due to side effects.  . Epidermoid cyst of skin 08/24/2017  . Essential hypertension 12/14/2015   Last Assessment & Plan:  Hypertension control: controlled  Medications: compliant Medication Management: as noted in orders Home blood pressure monitoring recommended additionally as needed for symptoms  The patient's care plan was reviewed and updated. Instructions and counseling were provided regarding patient goals and barriers. He was counseled to adopt a healthy lifestyle. Educational resources and self-management tools have been provided as charted in [facility] list.   . H/O maze procedure 03/29/2017  . [name] 03/29/2017   Overview:  2011  . Hx of CABG 03/29/2017  . Hyperlipidemia 03/29/2017  . Hypertensive heart disease 03/29/2017  .  Kidney stones 07/23/2015   Overview:  x 3  Last Assessment & Plan:  By [place] has left nephrolithiasis, but not visible by KUB. Will check CT renal colic next year to assess both stone burden as well as to surveil AML.   . Leukocytoclastic vasculitis ([place]) 10/01/2017  . Localized edema 01/04/2018  . Lumbar radicular pain 01/19/2016  . Maculopapular rash 09/03/2017  . Nephrolithiasis 07/23/2015   Overview:  x 3  Last Assessment & Plan:  By [place] has left  nephrolithiasis, but not visible by KUB. Will check CT renal colic next year to assess both stone burden as well as to surveil AML.   Overview:  x 3  Last Assessment & Plan:  Has 48m nonobstructing LUP stone - not visible by KUB.  Will check renal [place] - he will contact office sooner if symptomatic.   . Non-sustained ventricular tachycardia ([place] 03/29/2017  . Other hyperlipidemia 03/29/2017  . Palpitations 10/01/2017  . Paroxysmal atrial fibrillation ([place] 03/29/2017  . Paroxysmal atrial fibrillation ([place] 03/29/2017  . Pleural effusion, bilateral 08/09/2017  . Post-nasal drainage 02/25/2016   Last Assessment & Plan:  Trial zyrtec and flonase  . Prostate cancer screening 06/20/2017   Last Assessment & Plan:  Recommend continued annual CaP screening until within 10 years of life expectancy. Given good health and fhx of longevity, would anticipate CaP screening to continue until age 72  PSA today and again in one year on day of visit.  . Pulmonary hypertension ([place] 08/09/2017  . Pulmonary nodules 06/12/2016  . S/P AVR (aortic valve replacement) 03/20/2016  . Supratherapeutic INR 07/26/2017  . Syncope 03/29/2017  . Syncope and collapse 02/01/2016  . Typical atrial flutter ([place] 02/01/2016    Past Surgical History:  Procedure Laterality Date  . CHOLECYSTECTOMY    . CORONARY ARTERY BYPASS GRAFT    . EXPLORATORY LAPAROTOMY  07/30/2017  . FOOT SURGERY    . FRACTURE SURGERY Right    wrist and forearm  . HERNIA REPAIR    . MECHANICAL AORTIC VALVE REPLACEMENT    . NASAL SINUS SURGERY    . RIGHT HEART CATH N/A 01/18/2018   Procedure: RIGHT HEART CATH;  Surgeon: BJolaine Artist MD;  [facility];  Service: Cardiovascular;  Laterality: N/A;  . RIGHT HEART CATH N/A 05/14/2018   Procedure: RIGHT HEART CATH;  Surgeon: BJolaine Artist MD;  [facility];  Service: Cardiovascular;  Laterality: N/A;  . TEE WITHOUT CARDIOVERSION N/A 05/21/2018   Procedure: TRANSESOPHAGEAL  ECHOCARDIOGRAM (TEE);  Surgeon: BJolaine Artist MD;  [facility];  Service: Cardiovascular;  Laterality: N/A;  . UPPER GASTROINTESTINAL ENDOSCOPY  07/12/2017   Patchy areas of mucosal inflammation noted in the antrum with edema,erthema and ulcerations. Bx. Chronicfocally active gastritis.  .Marland KitchenVASECTOMY      Vitals:   06/04/18 1320  BP: 118/75  Pulse: 62  SpO2: 94%     Subjective Assessment - 06/04/18 1322    Subjective  Patient reports that 6 weeks ago he was holding his horse's halter and she jerked which caused a tugging on his R shoulder. Did not see a MD at that time. Was hospitalized from 05/14/18-05/23/18 for hypotension and weight gain (CHF) where he was given a sling for R shoulder. Reports the sling made it worse. Current symptoms: pinching at 90 degrees ABD and flexion.  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  Saw orthopedic MD and he  said there was no sense in operating on it. Pain at best: 0/10, pain at worst: 10/10.    Pertinent History  a fib, a flutter, chronic anticoagulation, chronic back pain, COPD, CAD, dyspnea, HTN, aortic valve replacement 2018, CABG 2018, HLD, kidney stones, leukocytoclastic vasculitis, pleural effusion, syncope, wrist and forearm fx surgery    Limitations  House hold activities;Lifting    Diagnostic tests  R shoulder xray 05/17/18: No acute fracture or dislocation; Osteoarthritic change noted in the right glenohumeral joint. R shoulder MRI: 05/26: Full-thickness tear of the vast majority of the supraspinatus tendon; Mild distal infraspinatus partial-thickness articular surface tearing. Mild subscapularis tendinopathy. Poor visualization of the intra-articular segment of the long head of the biceps compatible with at least partial tearing.    Currently in Pain?  No/denies    Pain Location  Shoulder    Pain Orientation  Right    Aggravating Factors   lifting overhead, ABD past 90 degrees, flexion  past 90 degrees with elbow supinated (doesnt hurt in neutral)    Pain Relieving Factors  ice, lidocaine patches, pain meds         [facility] PT Assessment - 06/04/18 1337      Assessment   Medical Diagnosis  R RTC tear    Referring Provider  Dr. 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[employer], digging, caring for horses    Leisure  riding horse      Cognition   Overall Cognitive Status  Within Functional Limits for tasks assessed      Observation/Other Assessments   Observations  significant bruising on B UEs- reports he is on blood thinners    Focus on Therapeutic Outcomes (FOTO)   Shoulder: 63 (37% limited, 22% predicted)      Sensation   Light Touch  Appears Intact denies N/T      Coordination   Gross Motor Movements are Fluid and Coordinated  Yes      Posture/Postural Control   Posture/Postural Control  Postural limitations    Postural Limitations  Rounded Shoulders;Forward head      ROM / Strength   AROM / PROM / Strength  AROM;PROM;Strength      AROM   AROM Assessment Site  Shoulder    Right/Left Shoulder  Left;Right    Right Shoulder Flexion  120 Degrees pain in bicep    Right Shoulder ABduction  85 Degrees pain in supraspinatus region    Right Shoulder Internal Rotation  90 Degrees     Right Shoulder  External Rotation  60 Degrees    Left Shoulder Flexion  154 Degrees    Left Shoulder ABduction  180 Degrees    Left Shoulder Internal Rotation  84 Degrees    Left Shoulder External Rotation  65 Degrees      PROM   PROM Assessment Site  Shoulder    Right/Left Shoulder  Left;Right    Right Shoulder Flexion  130 Degrees    Right Shoulder ABduction  105 Degrees    Right Shoulder Internal Rotation  96 Degrees    Right Shoulder External Rotation  63 Degrees    Left Shoulder Flexion  160 Degrees    Left Shoulder ABduction  180 Degrees    Left Shoulder Internal Rotation  85 Degrees    Left Shoulder External Rotation  71 Degrees      Strength   Strength Assessment Site  Shoulder;Elbow    Right/Left Shoulder  Left    Left Shoulder Flexion  4+/5    Left Shoulder ABduction  4+/5    Left Shoulder Internal Rotation  4+/5    Left Shoulder External Rotation  4/5    Right/Left Elbow  Left;Right    Right Elbow Flexion  4+/5    Right Elbow Extension  4+/5    Left Elbow Flexion  4+/5    Left Elbow Extension  4+/5      Palpation   Palpation comment  Pain centered around R supraspinatus; marked atrophy of R supraspinatus and infraspinatus                 Objective measurements completed on examination: See above findings.              PT Education - 06/04/18 1506    Education Details  POC, prognosis, HEP    Person(s) Educated  Patient    Methods  Explanation;Demonstration;Tactile cues;Verbal cues;Handout    Comprehension  Verbalized understanding;Returned demonstration       PT Short Term Goals - 06/04/18 1516      PT SHORT TERM GOAL #1   Title  Patient to be independent with inital HEP.    Time  4    Period  Weeks    Status  New    Target Date  07/02/18        PT Long Term Goals - 06/04/18 1517      PT LONG TERM GOAL #1   Title  Patient to be independent with advanced HEP.    Time  8    Period  Weeks    Status  New    Target Date   07/30/18      PT LONG TERM GOAL #2   Title  Patient to demonstrate R shoulder AROM/PROM [facility] without pain limiting.     Time  8    Period  Weeks    Status  New    Target Date  07/30/18      PT LONG TERM GOAL #3   Title  Patient to demonstrate >=4+/5 R UE strength.    Time  8    Period  Weeks    Status  New    Target Date  07/30/18      PT LONG TERM GOAL #4   Title  Patient to demonstrate lifting 15# box overhead with <=1/10 pain.    Time  8    Period  Weeks    Status  New    Target Date  07/30/18  Plan - 06/04/18 1508    Clinical Impression Statement  Patient is a 72 y/o M presenting to OPPT with R shoulder pain of 6 weeks duration after MRI showed R full thickness supraspinatus tear and partial biceps tear. Patient not using sling and with limited R AROM shoulder and limited in lifting activities but reports he continues to work out, using only 3# weights. Vitals taken and were WNL. Presents with the following impairments: decreased ROM, decreased strength, pain, decreased functional activity tolerance. Would benefit from skilled PT services 2x/week for 8 weeks to address aforementioned impairments. Patient educated on and received HEP handout; advised not to push into pain to prevent further injury; patient reported understanding.     Clinical Presentation  Stable    Clinical Decision Making  Low    Rehab Potential  Good    PT Frequency  2x / week    PT Duration  8 weeks    PT Treatment/Interventions  ADLs/Self Care Home Management;Electrical Stimulation;Cryotherapy;Moist Heat;Therapeutic exercise;Therapeutic activities;Functional mobility training;Ultrasound;Patient/family education;Manual techniques;Vasopneumatic Device;Taping;Splinting;Energy conservation;Dry needling;Passive range of motion    PT Next Visit Plan  reassess HEP, gentle RTC and periscapular strengthening as tolerated     Consulted and Agree with Plan of Care  Patient       Patient will benefit  from skilled therapeutic intervention in order to improve the following deficits and impairments:  Hypomobility, Decreased activity tolerance, Decreased strength, Pain, Impaired UE functional use, Decreased range of motion, Impaired flexibility, Postural dysfunction  Visit Diagnosis: Acute pain of right shoulder  Stiffness of right shoulder, not elsewhere classified  Muscle weakness (generalized)  Other symptoms and signs involving the musculoskeletal system     Problem List Patient Active Problem List   Diagnosis Date Noted  . Enterococcal bacteremia   . Pulmonary edema   . Shoulder pain   . PAH (pulmonary artery hypertension) ([place]) 02/20/2018  . Hypokalemia 02/18/2018  . Anticoagulated on Coumadin 01/04/2018  . Localized edema 01/04/2018  . Leukocytoclastic vasculitis ([place]) 10/01/2017  . Palpitations 10/01/2017  . Maculopapular rash 09/03/2017  . Epidermoid cyst of skin 08/24/2017  . Pleural effusion, bilateral 08/09/2017  . Pulmonary hypertension ([place]) 08/09/2017  . Supratherapeutic INR 07/26/2017  . Asymptomatic microscopic hematuria 06/20/2017  . Prostate cancer screening 06/20/2017  . Atrial flutter ([place]) 03/29/2017  . Chronic anticoagulation 03/29/2017  . Coronary artery disease involving native coronary artery of native heart with angina pectoris ([place]) 03/29/2017  . H/O maze procedure 03/29/2017  . [name] 03/29/2017  . Hx of CABG 03/29/2017  . Hypertensive heart disease with heart failure ([place]) 03/29/2017  . Non-sustained ventricular tachycardia ([place]) 03/29/2017  . Hyperlipidemia 03/29/2017  . Syncope 03/29/2017  . Cough 10/17/2016  . COPD (chronic obstructive pulmonary disease) ([place]) 06/12/2016  . Pulmonary nodules 06/12/2016  . Anxiety 05/10/2016  . 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 Chronic allergic rhinitis 04/28/2016  . History of prosthetic aortic valve 03/20/2016  . Post-nasal drainage 02/25/2016  . Dyspnea  02/01/2016  . Syncope and collapse 02/01/2016  . Typical atrial flutter ([place]) 02/01/2016  . Lumbar radicular pain 01/19/2016  . Chronic midline back pain 12/14/2015  . Essential hypertension 12/14/2015  . Chronic prostatitis 07/23/2015  . Nephrolithiasis 07/23/2015    Janene Harvey, PT, DPT 06/04/18 4:19 PM   [facility] [address]  [place], [place], [address] Phone: [phone]   Fax:  [phone]  Name: OAK DOREY MRN: [id number] Date  of Birth: 28-Nov-1946

## 2018-06-04 NOTE — Telephone Encounter (Signed)
Notes recorded by Kerry Dory, CMA on 06/04/2018 at 12:22 PM EDT Patient aware. Patient voiced understanding   ------  Notes recorded by Georgiana Shore, NP on 06/04/2018 at 9:30 AM EDT Creatinine is up. Please call and have him hold torsemide for 2 days. If weight increases or he has symptoms of volume overload, have him call clinic. Recheck BMET in 1 week.

## 2018-06-06 ENCOUNTER — Encounter: Payer: Self-pay | Admitting: Cardiology

## 2018-06-06 ENCOUNTER — Ambulatory Visit (INDEPENDENT_AMBULATORY_CARE_PROVIDER_SITE_OTHER): Payer: Medicare Other | Admitting: Cardiology

## 2018-06-06 VITALS — BP 116/70 | HR 65 | Ht 69.0 in | Wt 146.8 lb

## 2018-06-06 DIAGNOSIS — I9589 Other hypotension: Secondary | ICD-10-CM | POA: Diagnosis not present

## 2018-06-06 DIAGNOSIS — I110 Hypertensive heart disease with heart failure: Secondary | ICD-10-CM | POA: Diagnosis not present

## 2018-06-06 DIAGNOSIS — I483 Typical atrial flutter: Secondary | ICD-10-CM | POA: Diagnosis not present

## 2018-06-06 DIAGNOSIS — I25119 Atherosclerotic heart disease of native coronary artery with unspecified angina pectoris: Secondary | ICD-10-CM

## 2018-06-06 DIAGNOSIS — I2721 Secondary pulmonary arterial hypertension: Secondary | ICD-10-CM

## 2018-06-06 DIAGNOSIS — Z7901 Long term (current) use of anticoagulants: Secondary | ICD-10-CM | POA: Diagnosis not present

## 2018-06-06 HISTORY — DX: Other hypotension: I95.89

## 2018-06-06 MED ORDER — FUROSEMIDE 20 MG PO TABS: 20.0000 mg | ORAL_TABLET | ORAL | 3 refills | Status: DC

## 2018-06-06 NOTE — Patient Instructions (Addendum)
Medication Instructions:  Your physician recommends that you continue on your current medications as directed. Please refer to the Current Medication list given to you today.  Check your temperature twice daily. Call if greater than 100 degrees.  Labwork: None  Testing/Procedures: None  Follow-Up: Your physician recommends that you schedule a follow-up appointment in: 6 weeks.  Any Other Special Instructions Will Be Listed Below (If Applicable).     If you need a refill on your cardiac medications before your next appointment, please call your pharmacy.

## 2018-06-06 NOTE — Progress Notes (Signed)
Cardiology Office Note:    Date:  06/06/2018   ID:  Lance Hicks, DOB [birth year], MRN [id number]  PCP:  Townsend Roger, MD  Cardiologist:  Shirlee More, MD    Referring MD: Townsend Roger, MD    ASSESSMENT:    1. PAH (pulmonary artery hypertension) ([place])   2. Chronic anticoagulation   3. Hypertensive heart disease with heart failure ([place])   4. Coronary artery disease involving native coronary artery of native heart with angina pectoris ([place])   5. Typical atrial flutter ([place])   6. Hypotension, chronic    PLAN:    In order of problems listed above:  1. Stable at this point in time he has been evaluated potentially is a candidate to be treated as an inpatient at [facility] and they declined.  I suspect the mechanism here is related to his underlying cirrhosis.  Unfortunately symptomatic hypotension precludes previous treatment.  He will continue to follow-up in the heart failure program 2. Continue warfarin goal INR is 3 3. Improved he will resume his diuretic tomorrow plan is to recheck renal function next week 4. Stable continue current medical treatment 5. Stable rate controlled 6. Improved continue Midodrine   Next appointment: 6 weeks   Medication Adjustments/Labs and Tests Ordered: Current medicines are reviewed at length with the patient today.  Concerns regarding medicines are outlined above.  No orders of the defined types were placed in this encounter.  No orders of the defined types were placed in this encounter.   Chief Complaint  Patient presents with  . Follow-up    History of Present Illness:    Lance Hicks is a 72 y.o. male with a hx of permanent AF, CAD and aortic stenosis s/p CABG, AVR wih #25 Carbomedics valve in 2010, attempted Maze and LAA excision at [facility] in [place] has developed [place] and required hospitalization recently at [facility] last seen 02/20/18.  Patient ID: Lance Hicks MRN: [id number], DOB/AGE: 10/02/1946 71 y.o. Admit  date: 05/14/2018 D/C date:     05/23/2018   Primary Discharge Diagnoses:  1. Pulmonary hypertension with cor pulmonale - On macitentan and adcirca 2. Chronic respiratory failure 3. Chronic atrial flutter 4. CAD s/p CABG 5. S/p mechanical AVR 6. Leukocytoclastic vasculitis 7. Cirrhosis 8. Rotator cuff tear 9. Fever with bacteremia - Amoxicillin 1 gram TID through 6/6 per ID  Compliance with diet, lifestyle and medications: Yes  He had a significant decline in his GFR was contacted by heart failure program his diuretic is on hold until tomorrow.  Unfortunately his weight is up 8 pounds but in general he feels improved he exercises no symptomatic hypotension shortness of breath chest pain palpitation or syncope.  He has been evaluated no if he had recurrent bacteremia I told him the alert for symptoms of [place] and to check his temperature once or twice a day and call if greater than 100.5.  Warfarin is being managed through his PCP office Past Medical History:  Diagnosis Date  . 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 In light of concurrent left nephrolithiasis, will check CT renal colic next year instead of renal US.   [name] Anticoagulated on Coumadin 01/04/2018  . Anxiety 05/10/2016   Last Assessment & Plan:  Doing well off of zoloft.  . Asymptomatic microscopic hematuria 06/20/2017   Last Assessment & Plan:  Had hematuria workup in [facility] in 2016 which negative CT and  cystoscopy. UA with 2+ blood last visit - we discussed recommendation for repeat workup at 5 years or if degree of hematuria progresses.   . Atrial fibrillation ([place]) 03/29/2017  . Atrial flutter ([place]) 03/29/2017  . Chronic allergic rhinitis 04/28/2016   Last Assessment & Plan:  Continue astelin  . Chronic anticoagulation 03/29/2017  . Chronic atrial fibrillation ([place]) 07/23/2015   Last Assessment & Plan:  Coumadin and metoprolol, cardiology referral to establish care.  .  Chronic midline back pain 12/14/2015   Last Assessment & Plan:  Pain management referral for further evaluation.  . Chronic prostatitis 07/23/2015   Last Assessment & Plan:  Has largely resolved since stopping bike riding. Recommend annual DRE AND PSA - will see back 12/2015 for annual screening, given 1st degree fhx. To call office for recurrent prostatitis symptoms.   [name] COPD (chronic obstructive pulmonary disease) ([place]) 06/12/2016  . Coronary artery disease involving native coronary artery of native heart with angina pectoris ([place]) 03/29/2017  . Cough 10/17/2016   Last Assessment & Plan:  Discussed typical course for acute viral illness. If symptoms worsen or fail to improve by 7-10d, delayed ATBs, fluids, rest, NSAIDs/APAP prn. Seek care if not improving. Needs earlier INR check due to ATBs.  [name] Dyspnea 02/01/2016   Last Assessment & Plan:  Overall improving, eval by pulm, plan for CT, neg stress test with cardiology. Recent switch to carvedilol due to side effects.  . Epidermoid cyst of skin 08/24/2017  . Essential hypertension 12/14/2015   Last Assessment & Plan:  Hypertension control: controlled  Medications: compliant Medication Management: as noted in orders Home blood pressure monitoring recommended additionally as needed for symptoms  The patient's care plan was reviewed and updated. Instructions and counseling were provided regarding patient goals and barriers. He was counseled to adopt a healthy lifestyle. Educational resources and self-management tools have been provided as charted in [facility] list.   . H/O maze procedure 03/29/2017  . [name] 03/29/2017   Overview:  2011  . Hx of CABG 03/29/2017  . Hyperlipidemia 03/29/2017  . Hypertensive heart disease 03/29/2017  . Kidney stones 07/23/2015   Overview:  x 3  Last Assessment & Plan:  By [place] has left nephrolithiasis, but not visible by KUB. Will check CT renal colic next year to assess both stone burden as well as to surveil  AML.   . Leukocytoclastic vasculitis ([place]) 10/01/2017  . Localized edema 01/04/2018  . Lumbar radicular pain 01/19/2016  . Maculopapular rash 09/03/2017  . Nephrolithiasis 07/23/2015   Overview:  x 3  Last Assessment & Plan:  By [place] has left nephrolithiasis, but not visible by KUB. Will check CT renal colic next year to assess both stone burden as well as to surveil AML.   Overview:  x 3  Last Assessment & Plan:  Has 83m nonobstructing LUP stone - not visible by KUB.  Will check renal [place] - he will contact office sooner if symptomatic.   . Non-sustained ventricular tachycardia ([place] 03/29/2017  . Other hyperlipidemia 03/29/2017  . Palpitations 10/01/2017  . Paroxysmal atrial fibrillation ([place] 03/29/2017  . Paroxysmal atrial fibrillation ([place] 03/29/2017  . Pleural effusion, bilateral 08/09/2017  . Post-nasal drainage 02/25/2016   Last Assessment & Plan:  Trial zyrtec and flonase  . Prostate cancer screening 06/20/2017   Last Assessment & Plan:  Recommend continued annual CaP screening until within 10 years of life expectancy. Given good health and fhx of longevity, would anticipate CaP  screening to continue until age 55.  PSA today and again in one year on day of visit.  . Pulmonary hypertension ([place]) 08/09/2017  . Pulmonary nodules 06/12/2016  . S/P AVR (aortic valve replacement) 03/20/2016  . Supratherapeutic INR 07/26/2017  . Syncope 03/29/2017  . Syncope and collapse 02/01/2016  . Typical atrial flutter ([place]) 02/01/2016    Past Surgical History:  Procedure Laterality Date  . CHOLECYSTECTOMY    . CORONARY ARTERY BYPASS GRAFT    . EXPLORATORY LAPAROTOMY  07/30/2017  . FOOT SURGERY    . FRACTURE SURGERY Right    wrist and forearm  . HERNIA REPAIR    . MECHANICAL AORTIC VALVE REPLACEMENT    . NASAL SINUS SURGERY    . RIGHT HEART CATH N/A 01/18/2018   Procedure: RIGHT HEART CATH;  Surgeon: Jolaine Artist, MD;  Location: [facility];  Service: Cardiovascular;  Laterality: N/A;  . RIGHT HEART  CATH N/A 05/14/2018   Procedure: RIGHT HEART CATH;  Surgeon: Jolaine Artist, MD;  Location: [facility];  Service: Cardiovascular;  Laterality: N/A;  . TEE WITHOUT CARDIOVERSION N/A 05/21/2018   Procedure: TRANSESOPHAGEAL ECHOCARDIOGRAM (TEE);  Surgeon: Jolaine Artist, MD;  Location: [facility];  Service: Cardiovascular;  Laterality: N/A;  . UPPER GASTROINTESTINAL ENDOSCOPY  07/12/2017   Patchy areas of mucosal inflammation noted in the antrum with edema,erthema and ulcerations. Bx. Chronicfocally active gastritis.  [name] VASECTOMY      Current Medications: Current Meds  Medication Sig  . acetaminophen (TYLENOL) 650 MG CR tablet Take 650 mg by mouth every 8 (eight) hours as needed for pain.  [name] aspirin EC 81 MG tablet Take 81 mg by mouth daily.  [name] atorvastatin (LIPITOR) 40 MG tablet Take 1 tablet (40 mg total) by mouth daily at 6 PM.  . B Complex-C (B-COMPLEX WITH VITAMIN C) tablet Take 1 tablet by mouth daily.   . cetirizine (ZYRTEC ALLERGY) 10 MG tablet Take 10 mg by mouth daily as needed for allergies.   . Coenzyme Q10-Vitamin E (QUNOL ULTRA COQ10) 100-150 MG-UNIT CAPS Take 1 capsule by mouth every other day.  . fluticasone (FLONASE) 50 MCG/ACT nasal spray Place 1-2 sprays into both nostrils as needed for allergies.   . Liniments (SALONPAS EX) Apply 1 patch topically daily as needed (pain).   . Macitentan (OPSUMIT) 10 MG TABS Take 1 tablet (10 mg total) by mouth daily.  . midodrine (PROAMATINE) 10 MG tablet Take 1 tablet (10 mg total) by mouth 3 (three) times daily with meals.  . multivitamin-lutein (OCUVITE-LUTEIN) CAPS capsule Take 1 capsule by mouth daily.  . pantoprazole (PROTONIX) 40 MG tablet Take 1 tablet (40 mg total) by mouth daily.  [name] spironolactone (ALDACTONE) 25 MG tablet Take 0.5 tablets (12.5 mg total) by mouth daily.  . tadalafil, PAH, (ADCIRCA) 20 MG tablet Take 2 tablets (40 mg total) by mouth daily.  [name] warfarin (COUMADIN) 2.5 MG tablet Take 2.5-5 mg by mouth  See admin instructions. Take 2.96m on all days except Saturday, take 530m     Allergies:   Amoxicillin-pot clavulanate and Tape   Social History   Socioeconomic History  . Marital status: Single    Spouse name: Not on file  . Number of children: Not on file  . Years of education: Not on file  . Highest education level: Not on file  Occupational History  . Not on file  Social Needs  . Financial resource strain: Not on file  . Food  insecurity:    Worry: Not on file    Inability: Not on file  . Transportation needs:    Medical: Not on file    Non-medical: Not on file  Tobacco Use  . Smoking status: Former Smoker    Packs/day: 2.00    Years: 34.00    Pack years: 68.00    Types: Cigarettes    Last attempt to quit: 07/16/2000    Years since quitting: 17.9  . Smokeless tobacco: Never Used  Substance and Sexual Activity  . Alcohol use: Yes  . Drug use: No  . Sexual activity: Not on file  Lifestyle  . Physical activity:    Days per week: Not on file    Minutes per session: Not on file  . Stress: Not on file  Relationships  . Social connections:    Talks on phone: Not on file    Gets together: Not on file    Attends religious service: Not on file    Active member of club or organization: Not on file    Attends meetings of clubs or organizations: Not on file    Relationship status: Not on file  Other Topics Concern  . Not on file  Social History Narrative  . Not on file     Family History: The patient's family history includes Arthritis in his mother; Asthma in his mother; Heart attack in his father; Hypertension in his father; Stroke in his paternal grandmother. There is no history of Colon cancer. ROS:   Please see the history of present illness.    All other systems reviewed and are negative.  EKGs/Labs/Other Studies Reviewed:    The following studies were reviewed today:   Recent Labs: 05/14/2018: B Natriuretic Peptide 1,247.0 05/15/2018: TSH  2.768 05/16/2018: Magnesium 1.8 05/18/2018: ALT 13 06/03/2018: BUN 57; Creatinine, Ser 1.82; Hemoglobin 11.8; Platelets 246; Potassium 4.8; Sodium 138  Recent Lipid Panel No results found for: CHOL, TRIG, HDL, CHOLHDL, VLDL, LDLCALC, LDLDIRECT  Physical Exam:    VS:  BP 116/70 (BP Location: Right Arm, Patient Position: Sitting, Cuff Size: Normal)   Pulse 65   Ht _0  (1.753 m)   Wt 146 lb 12.8 oz (66.6 kg)   SpO2 98%   BMI 21.68 kg/m     Wt Readings from Last 3 Encounters:  06/06/18 146 lb 12.8 oz (66.6 kg)  06/03/18 146 lb (66.2 kg)  05/30/18 145 lb (65.8 kg)     GEN:  Well nourished, well developed in no acute distress HEENT: Normal NECK: No JVD; No carotid bruits LYMPHATICS: No lymphadenopathy CARDIAC: Irr Irr variable S1 sharp CS  RESPIRATORY:  Clear to auscultation without rales, wheezing or rhonchi  ABDOMEN: Soft, non-tender, non-distended MUSCULOSKELETAL:  1+ edema; No deformity  SKIN: Warm and dry NEUROLOGIC:  Alert and oriented x 3 PSYCHIATRIC:  Normal affect    Signed, Shirlee More, MD  06/06/2018 8:17 AM    [place]

## 2018-06-10 ENCOUNTER — Ambulatory Visit: Payer: Medicare Other

## 2018-06-10 DIAGNOSIS — M25511 Pain in right shoulder: Secondary | ICD-10-CM

## 2018-06-10 DIAGNOSIS — M6281 Muscle weakness (generalized): Secondary | ICD-10-CM | POA: Diagnosis not present

## 2018-06-10 DIAGNOSIS — M25611 Stiffness of right shoulder, not elsewhere classified: Secondary | ICD-10-CM | POA: Diagnosis not present

## 2018-06-10 DIAGNOSIS — R29898 Other symptoms and signs involving the musculoskeletal system: Secondary | ICD-10-CM | POA: Diagnosis not present

## 2018-06-10 NOTE — Therapy (Addendum)
[facility] [address]  [place], [place], [address] Phone: [phone]   Fax:  [phone]  Physical Therapy Treatment  Patient Details  Name: Lance Hicks MRN: [id number] Date of Birth: Apr 17, 1946 Referring Provider: Dr. Stann Mainland   Encounter Date: 06/10/2018  PT End of Session - 06/10/18 1413    Visit Number  2    Number of Visits  17    Date for PT Re-Evaluation  07/30/18    Authorization Type  Medicare [place] F    PT Start Time  1405    PT Stop Time  1447    PT Time Calculation (min)  42 min    Activity Tolerance  Patient tolerated treatment well    Behavior During Therapy  [facility] for tasks assessed/performed       Past Medical History:  Diagnosis Date  . 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 In light of concurrent left nephrolithiasis, will check CT renal colic next year instead of renal US.   [name] Anticoagulated on Coumadin 01/04/2018  . Anxiety 05/10/2016   Last Assessment & Plan:  Doing well off of zoloft.  . Asymptomatic microscopic hematuria 06/20/2017   Last Assessment & Plan:  Had hematuria workup in [facility] in 2016 which negative CT and cystoscopy. UA with 2+ blood last visit - we discussed recommendation for repeat workup at 5 years or if degree of hematuria progresses.   . Atrial fibrillation ([place]) 03/29/2017  . Atrial flutter ([place]) 03/29/2017  . Chronic allergic rhinitis 04/28/2016   Last Assessment & Plan:  Continue astelin  . Chronic anticoagulation 03/29/2017  . Chronic atrial fibrillation ([place]) 07/23/2015   Last Assessment & Plan:  Coumadin and metoprolol, cardiology referral to establish care.  . Chronic midline back pain 12/14/2015   Last Assessment & Plan:  Pain management referral for further evaluation.  . Chronic prostatitis 07/23/2015   Last Assessment & Plan:  Has largely resolved since stopping bike riding. Recommend annual DRE AND  PSA - will see back 12/2015 for annual screening, given 1st degree fhx. To call office for recurrent prostatitis symptoms.   [name] COPD (chronic obstructive pulmonary disease) ([place]) 06/12/2016  . Coronary artery disease involving native coronary artery of native heart with angina pectoris ([place]) 03/29/2017  . Cough 10/17/2016   Last Assessment & Plan:  Discussed typical course for acute viral illness. If symptoms worsen or fail to improve by 7-10d, delayed ATBs, fluids, rest, NSAIDs/APAP prn. Seek care if not improving. Needs earlier INR check due to ATBs.  [name] Dyspnea 02/01/2016   Last Assessment & Plan:  Overall improving, eval by pulm, plan for CT, neg stress test with cardiology. Recent switch to carvedilol due to side effects.  . Epidermoid cyst of skin 08/24/2017  . Essential hypertension 12/14/2015   Last Assessment & Plan:  Hypertension control: controlled  Medications: compliant Medication Management: as noted in orders Home blood pressure monitoring recommended additionally as needed for symptoms  The patient's care plan was reviewed and updated. Instructions and counseling were provided regarding patient goals and barriers. He was counseled to adopt a healthy lifestyle. Educational resources and self-management tools have been provided as charted in [facility] list.   . H/O maze procedure 03/29/2017  . [name] 03/29/2017   Overview:  2011  . Hx of CABG 03/29/2017  . Hyperlipidemia 03/29/2017  . Hypertensive heart disease 03/29/2017  .  Kidney stones 07/23/2015   Overview:  x 3  Last Assessment & Plan:  By [place] has left nephrolithiasis, but not visible by KUB. Will check CT renal colic next year to assess both stone burden as well as to surveil AML.   . Leukocytoclastic vasculitis ([place]) 10/01/2017  . Localized edema 01/04/2018  . Lumbar radicular pain 01/19/2016  . Maculopapular rash 09/03/2017  . Nephrolithiasis 07/23/2015   Overview:  x 3  Last Assessment & Plan:  By [place] has left  nephrolithiasis, but not visible by KUB. Will check CT renal colic next year to assess both stone burden as well as to surveil AML.   Overview:  x 3  Last Assessment & Plan:  Has 15m nonobstructing LUP stone - not visible by KUB.  Will check renal [place] - he will contact office sooner if symptomatic.   . Non-sustained ventricular tachycardia ([place] 03/29/2017  . Other hyperlipidemia 03/29/2017  . Palpitations 10/01/2017  . Paroxysmal atrial fibrillation ([place] 03/29/2017  . Paroxysmal atrial fibrillation ([place] 03/29/2017  . Pleural effusion, bilateral 08/09/2017  . Post-nasal drainage 02/25/2016   Last Assessment & Plan:  Trial zyrtec and flonase  . Prostate cancer screening 06/20/2017   Last Assessment & Plan:  Recommend continued annual CaP screening until within 10 years of life expectancy. Given good health and fhx of longevity, would anticipate CaP screening to continue until age 72  PSA today and again in one year on day of visit.  . Pulmonary hypertension ([place] 08/09/2017  . Pulmonary nodules 06/12/2016  . S/P AVR (aortic valve replacement) 03/20/2016  . Supratherapeutic INR 07/26/2017  . Syncope 03/29/2017  . Syncope and collapse 02/01/2016  . Typical atrial flutter ([place] 02/01/2016    Past Surgical History:  Procedure Laterality Date  . CHOLECYSTECTOMY    . CORONARY ARTERY BYPASS GRAFT    . EXPLORATORY LAPAROTOMY  07/30/2017  . FOOT SURGERY    . FRACTURE SURGERY Right    wrist and forearm  . HERNIA REPAIR    . MECHANICAL AORTIC VALVE REPLACEMENT    . NASAL SINUS SURGERY    . RIGHT HEART CATH N/A 01/18/2018   Procedure: RIGHT HEART CATH;  Surgeon: BJolaine Artist MD;  [facility];  Service: Cardiovascular;  Laterality: N/A;  . RIGHT HEART CATH N/A 05/14/2018   Procedure: RIGHT HEART CATH;  Surgeon: BJolaine Artist MD;  [facility];  Service: Cardiovascular;  Laterality: N/A;  . TEE WITHOUT CARDIOVERSION N/A 05/21/2018   Procedure: TRANSESOPHAGEAL  ECHOCARDIOGRAM (TEE);  Surgeon: BJolaine Artist MD;  [facility];  Service: Cardiovascular;  Laterality: N/A;  . UPPER GASTROINTESTINAL ENDOSCOPY  07/12/2017   Patchy areas of mucosal inflammation noted in the antrum with edema,erthema and ulcerations. Bx. Chronicfocally active gastritis.  .Marland KitchenVASECTOMY      There were no vitals filed for this visit.  Subjective Assessment - 06/10/18 1450    Subjective  Pt. noting he had some R biceps soreness after performing, "20# biceps curls in the gym".      Pertinent History  a fib, a flutter, chronic anticoagulation, chronic back pain, COPD, CAD, dyspnea, HTN, aortic valve replacement 2018, CABG 2018, HLD, kidney stones, leukocytoclastic vasculitis, pleural effusion, syncope, wrist and forearm fx surgery    Diagnostic tests  R shoulder xray 05/17/18: No acute fracture or dislocation; Osteoarthritic change noted in the right glenohumeral joint. R shoulder MRI: 05/26: Full-thickness tear of the vast majority of the supraspinatus tendon; Mild  distal infraspinatus partial-thickness articular surface tearing. Mild subscapularis tendinopathy. Poor visualization of the intra-articular segment of the long head of the biceps compatible with at least partial tearing.    Currently in Pain?  No/denies    Pain Score  0-No pain    Multiple Pain Sites  No       Vitals:  O2 Sats %: 94%  BP: 126/82 mmHg                [facility] Adult PT Treatment/Exercise - 06/10/18 1427      Shoulder Exercises: Supine   External Rotation  AAROM;Right;10 reps    External Rotation Limitations  wand     Internal Rotation  AAROM;Right;10 reps    Internal Rotation Limitations  wand     Flexion  Right;AAROM;10 reps    Flexion Limitations  wand     ABduction  Right;AAROM;10 reps    ABduction Limitations  wand       Shoulder Exercises: Standing   Extension  Both;10 reps;Theraband    Theraband Level (Shoulder Extension)  Level 2 (Red)    Extension  Limitations  cues for scap retraction     Row  Both;10 reps;Theraband;Strengthening    Theraband Level (Shoulder Row)  Level 2 (Red)      Shoulder Exercises: ROM/Strengthening   Lat Pull  10 reps    Lat Pull Limitations  15#      Shoulder Exercises: Isometric Strengthening   Flexion  -- 5" x 10 reps     Extension  -- 5" x 10 reps     External Rotation  -- 5" x 10 reps     Internal Rotation  -- 5" x 10 reps     ABduction  -- 5" x 10 reps       Manual Therapy   Manual Therapy  Passive ROM    Passive ROM  R shoulder PROM all directions to pt. tolerance; pain free; some "pulling"             PT Education - 06/10/18 1453    Education Details  HEP review     Person(s) Educated  Patient    Methods  Explanation;Demonstration;Verbal cues;Handout    Comprehension  Verbalized understanding;Returned demonstration;Verbal cues required;Need further instruction       PT Short Term Goals - 06/10/18 1414      PT SHORT TERM GOAL #1   Title  Patient to be independent with inital HEP.    Time  4    Period  Weeks    Status  On-going        PT Long Term Goals - 06/10/18 1414      PT LONG TERM GOAL #1   Title  Patient to be independent with advanced HEP.    Time  8    Period  Weeks    Status  On-going      PT LONG TERM GOAL #2   Title  Patient to demonstrate R shoulder AROM/PROM WFL without pain limiting.     Time  8    Period  Weeks    Status  On-going      PT LONG TERM GOAL #3   Title  Patient to demonstrate >=4+/5 R UE strength.    Time  8    Period  Weeks    Status  On-going      PT LONG TERM GOAL #4   Title  Patient to demonstrate lifting 15# box overhead with <=1/10 pain.  Time  8    Period  Weeks    Status  On-going            Plan - 06/10/18 1414    Clinical Impression Statement  Pt. noting he had some R biceps soreness yesterday after performing, "20# biceps curls in the gym".  Pt. instructed to avoid performing heavy resisted biceps curl to avoid  excessive shoulder/biceps strain.  Pt. with good overall performance of HEP, noting no issues at home with these.  Tolerated initiation of shoulder isometrics and scapular strengthening well today without pain.  HEP updated with red TB resistance.  Will monitor tolerance for today's progression and continue to progress toward goals per pt. tolerance in upcoming visits.    PT Treatment/Interventions  ADLs/Self Care Home Management;Electrical Stimulation;Cryotherapy;Moist Heat;Therapeutic exercise;Therapeutic activities;Functional mobility training;Ultrasound;Patient/family education;Manual techniques;Vasopneumatic Device;Taping;Splinting;Energy conservation;Dry needling;Passive range of motion    PT Next Visit Plan  Gentle RTC and periscapular strengthening as tolerated     Consulted and Agree with Plan of Care  Patient       Patient will benefit from skilled therapeutic intervention in order to improve the following deficits and impairments:  Hypomobility, Decreased activity tolerance, Decreased strength, Pain, Impaired UE functional use, Decreased range of motion, Impaired flexibility, Postural dysfunction  Visit Diagnosis: Acute pain of right shoulder  Stiffness of right shoulder, not elsewhere classified  Muscle weakness (generalized)  Other symptoms and signs involving the musculoskeletal system     Problem List Patient Active Problem List   Diagnosis Date Noted  . Hypotension, chronic 06/06/2018  . Enterococcal bacteremia   . Pulmonary edema   . Shoulder pain   . PAH (pulmonary artery hypertension) ([place]) 02/20/2018  . Hypokalemia 02/18/2018  . Anticoagulated on Coumadin 01/04/2018  . Localized edema 01/04/2018  . Leukocytoclastic vasculitis ([place]) 10/01/2017  . Palpitations 10/01/2017  . Maculopapular rash 09/03/2017  . Epidermoid cyst of skin 08/24/2017  . Pleural effusion, bilateral 08/09/2017  . Pulmonary hypertension ([place]) 08/09/2017  . Supratherapeutic INR 07/26/2017   . Asymptomatic microscopic hematuria 06/20/2017  . Prostate cancer screening 06/20/2017  . Atrial flutter ([place]) 03/29/2017  . Chronic anticoagulation 03/29/2017  . Coronary artery disease involving native coronary artery of native heart with angina pectoris ([place]) 03/29/2017  . H/O maze procedure 03/29/2017  . [name] 03/29/2017  . Hx of CABG 03/29/2017  . Hypertensive heart disease with heart failure ([place]) 03/29/2017  . Non-sustained ventricular tachycardia ([place]) 03/29/2017  . Hyperlipidemia 03/29/2017  . Syncope 03/29/2017  . Cough 10/17/2016  . COPD (chronic obstructive pulmonary disease) ([place]) 06/12/2016  . Pulmonary nodules 06/12/2016  . Anxiety 05/10/2016  . 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 Chronic allergic rhinitis 04/28/2016  . History of prosthetic aortic valve 03/20/2016  . Post-nasal drainage 02/25/2016  . Dyspnea 02/01/2016  . Syncope and collapse 02/01/2016  . Typical atrial flutter ([place]) 02/01/2016  . Lumbar radicular pain 01/19/2016  . Chronic midline back pain 12/14/2015  . Essential hypertension 12/14/2015  . Chronic prostatitis 07/23/2015  . Nephrolithiasis 07/23/2015    Bess Harvest, PTA 06/10/18 3:02 PM   [facility] [address]  [place], [place], [address] Phone: [phone]   Fax:  [phone]  Name: KEEYON PRIVITERA MRN: [id number] Date of Birth: [birth year]

## 2018-06-12 ENCOUNTER — Ambulatory Visit: Payer: Medicare Other

## 2018-06-12 VITALS — BP 128/76 | HR 72

## 2018-06-12 DIAGNOSIS — M25611 Stiffness of right shoulder, not elsewhere classified: Secondary | ICD-10-CM | POA: Diagnosis not present

## 2018-06-12 DIAGNOSIS — R29898 Other symptoms and signs involving the musculoskeletal system: Secondary | ICD-10-CM

## 2018-06-12 DIAGNOSIS — M6281 Muscle weakness (generalized): Secondary | ICD-10-CM | POA: Diagnosis not present

## 2018-06-12 DIAGNOSIS — M25511 Pain in right shoulder: Secondary | ICD-10-CM

## 2018-06-12 NOTE — Therapy (Signed)
[facility] [address]  [place], [place], [address] Phone: [phone]   Fax:  [phone]  Physical Therapy Treatment  Patient Details  Name: Lance Hicks MRN: [id number] Date of Birth: 1946/11/02 Referring Provider: Dr. Stann Mainland   Encounter Date: 06/12/2018  PT End of Session - 06/12/18 1325    Visit Number  3    Number of Visits  17    Date for PT Re-Evaluation  07/30/18    Authorization Type  Medicare [place] [facility]    PT Start Time  1317    PT Stop Time  1402    PT Time Calculation (min)  45 min    Activity Tolerance  Patient tolerated treatment well    Behavior During Therapy  [facility] for tasks assessed/performed       Past Medical History:  Diagnosis Date  . 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 In light of concurrent left nephrolithiasis, will check CT renal colic next year instead of renal US.   [name] Anticoagulated on Coumadin 01/04/2018  . Anxiety 05/10/2016   Last Assessment & Plan:  Doing well off of zoloft.  . Asymptomatic microscopic hematuria 06/20/2017   Last Assessment & Plan:  Had hematuria workup in [facility] in 2016 which negative CT and cystoscopy. UA with 2+ blood last visit - we discussed recommendation for repeat workup at 5 years or if degree of hematuria progresses.   . Atrial fibrillation ([place]) 03/29/2017  . Atrial flutter ([place]) 03/29/2017  . Chronic allergic rhinitis 04/28/2016   Last Assessment & Plan:  Continue astelin  . Chronic anticoagulation 03/29/2017  . Chronic atrial fibrillation ([place]) 07/23/2015   Last Assessment & Plan:  Coumadin and metoprolol, cardiology referral to establish care.  . Chronic midline back pain 12/14/2015   Last Assessment & Plan:  Pain management referral for further evaluation.  . Chronic prostatitis 07/23/2015   Last Assessment & Plan:  Has largely resolved since stopping bike riding. Recommend annual DRE AND  PSA - will see back 12/2015 for annual screening, given 1st degree fhx. To call office for recurrent prostatitis symptoms.   [name] COPD (chronic obstructive pulmonary disease) ([place]) 06/12/2016  . Coronary artery disease involving native coronary artery of native heart with angina pectoris ([place]) 03/29/2017  . Cough 10/17/2016   Last Assessment & Plan:  Discussed typical course for acute viral illness. If symptoms worsen or fail to improve by 7-10d, delayed ATBs, fluids, rest, NSAIDs/APAP prn. Seek care if not improving. Needs earlier INR check due to ATBs.  [name] Dyspnea 02/01/2016   Last Assessment & Plan:  Overall improving, eval by pulm, plan for CT, neg stress test with cardiology. Recent switch to carvedilol due to side effects.  . Epidermoid cyst of skin 08/24/2017  . Essential hypertension 12/14/2015   Last Assessment & Plan:  Hypertension control: controlled  Medications: compliant Medication Management: as noted in orders Home blood pressure monitoring recommended additionally as needed for symptoms  The patient's care plan was reviewed and updated. Instructions and counseling were provided regarding patient goals and barriers. He was counseled to adopt a healthy lifestyle. Educational resources and self-management tools have been provided as charted in [facility] list.   . H/O maze procedure 03/29/2017  . [name] 03/29/2017   Overview:  2011  . Hx of CABG 03/29/2017  . Hyperlipidemia 03/29/2017  . Hypertensive heart disease 03/29/2017  .  Kidney stones 07/23/2015   Overview:  x 3  Last Assessment & Plan:  By [place] has left nephrolithiasis, but not visible by KUB. Will check CT renal colic next year to assess both stone burden as well as to surveil AML.   . Leukocytoclastic vasculitis ([place]) 10/01/2017  . Localized edema 01/04/2018  . Lumbar radicular pain 01/19/2016  . Maculopapular rash 09/03/2017  . Nephrolithiasis 07/23/2015   Overview:  x 3  Last Assessment & Plan:  By [place] has left  nephrolithiasis, but not visible by KUB. Will check CT renal colic next year to assess both stone burden as well as to surveil AML.   Overview:  x 3  Last Assessment & Plan:  Has 33m nonobstructing LUP stone - not visible by KUB.  Will check renal [place] - he will contact office sooner if symptomatic.   . Non-sustained ventricular tachycardia ([place] 03/29/2017  . Other hyperlipidemia 03/29/2017  . Palpitations 10/01/2017  . Paroxysmal atrial fibrillation ([place] 03/29/2017  . Paroxysmal atrial fibrillation ([place] 03/29/2017  . Pleural effusion, bilateral 08/09/2017  . Post-nasal drainage 02/25/2016   Last Assessment & Plan:  Trial zyrtec and flonase  . Prostate cancer screening 06/20/2017   Last Assessment & Plan:  Recommend continued annual CaP screening until within 10 years of life expectancy. Given good health and fhx of longevity, would anticipate CaP screening to continue until age 72  PSA today and again in one year on day of visit.  . Pulmonary hypertension ([place] 08/09/2017  . Pulmonary nodules 06/12/2016  . S/P AVR (aortic valve replacement) 03/20/2016  . Supratherapeutic INR 07/26/2017  . Syncope 03/29/2017  . Syncope and collapse 02/01/2016  . Typical atrial flutter ([place] 02/01/2016    Past Surgical History:  Procedure Laterality Date  . CHOLECYSTECTOMY    . CORONARY ARTERY BYPASS GRAFT    . EXPLORATORY LAPAROTOMY  07/30/2017  . FOOT SURGERY    . FRACTURE SURGERY Right    wrist and forearm  . HERNIA REPAIR    . MECHANICAL AORTIC VALVE REPLACEMENT    . NASAL SINUS SURGERY    . RIGHT HEART CATH N/A 01/18/2018   Procedure: RIGHT HEART CATH;  Surgeon: BJolaine Artist MD;  [facility];  Service: Cardiovascular;  Laterality: N/A;  . RIGHT HEART CATH N/A 05/14/2018   Procedure: RIGHT HEART CATH;  Surgeon: BJolaine Artist MD;  [facility];  Service: Cardiovascular;  Laterality: N/A;  . TEE WITHOUT CARDIOVERSION N/A 05/21/2018   Procedure: TRANSESOPHAGEAL  ECHOCARDIOGRAM (TEE);  Surgeon: BJolaine Artist MD;  [facility];  Service: Cardiovascular;  Laterality: N/A;  . UPPER GASTROINTESTINAL ENDOSCOPY  07/12/2017   Patchy areas of mucosal inflammation noted in the antrum with edema,erthema and ulcerations. Bx. Chronicfocally active gastritis.  .Marland KitchenVASECTOMY      Vitals:   06/12/18 1323  BP: 128/76  Pulse: 72    Subjective Assessment - 06/12/18 1333    Subjective  Pt. noting some R biceps soreness today.      Pertinent History  a fib, a flutter, chronic anticoagulation, chronic back pain, COPD, CAD, dyspnea, HTN, aortic valve replacement 2018, CABG 2018, HLD, kidney stones, leukocytoclastic vasculitis, pleural effusion, syncope, wrist and forearm fx surgery    Diagnostic tests  R shoulder xray 05/17/18: No acute fracture or dislocation; Osteoarthritic change noted in the right glenohumeral joint. R shoulder MRI: 05/26: Full-thickness tear of the vast majority of the supraspinatus tendon; Mild distal infraspinatus partial-thickness articular  surface tearing. Mild subscapularis tendinopathy. Poor visualization of the intra-articular segment of the long head of the biceps compatible with at least partial tearing.    Currently in Pain?  No/denies    Pain Score  0-No pain    Multiple Pain Sites  No                       [facility] Adult PT Treatment/Exercise - 06/12/18 1346      Shoulder Exercises: Supine   Protraction  Right;15 reps    Protraction Limitations  2#    Flexion  Right;10 reps    Shoulder Flexion Weight (lbs)  1    Flexion Limitations  --    Other Supine Exercises  R shoulder circles 2# CW, CCW x 10 reps       Shoulder Exercises: Sidelying   External Rotation  Right;10 reps    External Rotation Weight (lbs)  1    ABduction  Right;10 reps    ABduction Weight (lbs)  1      Shoulder Exercises: Standing   External Rotation  Right;10 reps;Theraband roll under elbow     Theraband Level (Shoulder External  Rotation)  Level 1 (Yellow)    Internal Rotation  Right;10 reps;Theraband roll under elbow     Theraband Level (Shoulder Internal Rotation)  Level 1 (Yellow)    Extension  Both;10 reps;Theraband;Strengthening    Theraband Level (Shoulder Extension)  Level 2 (Red)    Row  Both;10 reps;Strengthening;Theraband    Theraband Level (Shoulder Row)  Level 2 (Red)      Shoulder Exercises: ROM/Strengthening   Cybex Row  15 reps Cues for scap. retraction     Cybex Row Limitations  10#       Manual Therapy   Manual Therapy  Passive ROM    Passive ROM  R shoulder PROM all directions to pt. tolerance; pain free; some "pulling"             PT Education - 06/12/18 1410    Education Details  HEP update     Person(s) Educated  Patient    Methods  Explanation;Demonstration;Verbal cues;Handout    Comprehension  Verbalized understanding;Returned demonstration;Verbal cues required;Need further instruction       PT Short Term Goals - 06/10/18 1414      PT SHORT TERM GOAL #1   Title  Patient to be independent with inital HEP.    Time  4    Period  Weeks    Status  On-going        PT Long Term Goals - 06/10/18 1414      PT LONG TERM GOAL #1   Title  Patient to be independent with advanced HEP.    Time  8    Period  Weeks    Status  On-going      PT LONG TERM GOAL #2   Title  Patient to demonstrate R shoulder AROM/PROM WFL without pain limiting.     Time  8    Period  Weeks    Status  On-going      PT LONG TERM GOAL #3   Title  Patient to demonstrate >=4+/5 R UE strength.    Time  8    Period  Weeks    Status  On-going      PT LONG TERM GOAL #4   Title  Patient to demonstrate lifting 15# box overhead with <=1/10 pain.    Time  8  Period  Weeks    Status  On-going            Plan - 06/12/18 1332    Clinical Impression Statement  Pt. doing well today however noted some R biceps soreness when, "Using the arm", today.  Remained pain free throughout scapular/RTC  strengthening progression today and seem to be responding well to strengthening activities.  Ended session pain free thus modalities deferred.      PT Treatment/Interventions  ADLs/Self Care Home Management;Electrical Stimulation;Cryotherapy;Moist Heat;Therapeutic exercise;Therapeutic activities;Functional mobility training;Ultrasound;Patient/family education;Manual techniques;Vasopneumatic Device;Taping;Splinting;Energy conservation;Dry needling;Passive range of motion    PT Next Visit Plan  Gentle RTC and periscapular strengthening as tolerated        Patient will benefit from skilled therapeutic intervention in order to improve the following deficits and impairments:  Hypomobility, Decreased activity tolerance, Decreased strength, Pain, Impaired UE functional use, Decreased range of motion, Impaired flexibility, Postural dysfunction  Visit Diagnosis: Acute pain of right shoulder  Stiffness of right shoulder, not elsewhere classified  Muscle weakness (generalized)  Other symptoms and signs involving the musculoskeletal system     Problem List Patient Active Problem List   Diagnosis Date Noted  . Hypotension, chronic 06/06/2018  . Enterococcal bacteremia   . Pulmonary edema   . Shoulder pain   . PAH (pulmonary artery hypertension) ([place]) 02/20/2018  . Hypokalemia 02/18/2018  . Anticoagulated on Coumadin 01/04/2018  . Localized edema 01/04/2018  . Leukocytoclastic vasculitis ([place]) 10/01/2017  . Palpitations 10/01/2017  . Maculopapular rash 09/03/2017  . Epidermoid cyst of skin 08/24/2017  . Pleural effusion, bilateral 08/09/2017  . Pulmonary hypertension ([place]) 08/09/2017  . Supratherapeutic INR 07/26/2017  . Asymptomatic microscopic hematuria 06/20/2017  . Prostate cancer screening 06/20/2017  . Atrial flutter ([place]) 03/29/2017  . Chronic anticoagulation 03/29/2017  . Coronary artery disease involving native coronary artery of native heart with angina pectoris ([place]) 03/29/2017   . H/O maze procedure 03/29/2017  . [name] 03/29/2017  . Hx of CABG 03/29/2017  . Hypertensive heart disease with heart failure ([place]) 03/29/2017  . Non-sustained ventricular tachycardia ([place]) 03/29/2017  . Hyperlipidemia 03/29/2017  . Syncope 03/29/2017  . Cough 10/17/2016  . COPD (chronic obstructive pulmonary disease) ([place]) 06/12/2016  . Pulmonary nodules 06/12/2016  . Anxiety 05/10/2016  . 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 Chronic allergic rhinitis 04/28/2016  . History of prosthetic aortic valve 03/20/2016  . Post-nasal drainage 02/25/2016  . Dyspnea 02/01/2016  . Syncope and collapse 02/01/2016  . Typical atrial flutter ([place]) 02/01/2016  . Lumbar radicular pain 01/19/2016  . Chronic midline back pain 12/14/2015  . Essential hypertension 12/14/2015  . Chronic prostatitis 07/23/2015  . Nephrolithiasis 07/23/2015    Bess Harvest, PTA 06/12/18 3:27 PM   [facility] [address]  [place], [place], [address] Phone: [phone]   Fax:  [phone]  Name: ANNIE ROSEBOOM MRN: [id number] Date of Birth: 06-07-1946

## 2018-06-13 ENCOUNTER — Telehealth (HOSPITAL_COMMUNITY): Payer: Self-pay | Admitting: Cardiology

## 2018-06-13 ENCOUNTER — Ambulatory Visit (HOSPITAL_COMMUNITY)
Admission: RE | Admit: 2018-06-13 | Discharge: 2018-06-13 | Disposition: A | Discharge status: Home / Self Care | Payer: Medicare Other | Source: Ambulatory Visit | Attending: Cardiology | Admitting: Cardiology

## 2018-06-13 ENCOUNTER — Encounter (HOSPITAL_COMMUNITY): Payer: Self-pay | Admitting: *Deleted

## 2018-06-13 ENCOUNTER — Telehealth (HOSPITAL_COMMUNITY): Payer: Self-pay | Admitting: *Deleted

## 2018-06-13 DIAGNOSIS — I272 Pulmonary hypertension, unspecified: Secondary | ICD-10-CM | POA: Diagnosis not present

## 2018-06-13 LAB — BASIC METABOLIC PANEL
Anion gap: 10 (ref 5–15)
BUN: 30 mg/dL — ABNORMAL HIGH (ref 6–20)
CO2: 25 mmol/L (ref 22–32)
Calcium: 9.3 mg/dL (ref 8.9–10.3)
Chloride: 103 mmol/L (ref 101–111)
Creatinine, Ser: 1.56 mg/dL — ABNORMAL HIGH (ref 0.61–1.24)
GFR calc Af Amer: 50 mL/min — ABNORMAL LOW (ref >60)
GFR calc non Af Amer: 43 mL/min — ABNORMAL LOW (ref >60)
Glucose, Bld: 107 mg/dL — ABNORMAL HIGH (ref 65–99)
Potassium: 3.1 mmol/L — ABNORMAL LOW (ref 3.5–5.1)
Sodium: 138 mmol/L (ref 135–145)

## 2018-06-13 MED ORDER — POTASSIUM CHLORIDE CRYS ER 20 MEQ PO TBCR: 40.0000 meq | EXTENDED_RELEASE_TABLET | Freq: Every day | ORAL | 3 refills | Status: DC

## 2018-06-13 NOTE — Telephone Encounter (Signed)
-----  Message from Georgiana Shore, NP sent at 06/13/2018 12:21 PM EDT ----- Creatinine is better, but potassium is now low. Please have him start 40 meq potassium daily and get a repeat BMET next week. He can get it when he sees pulm next week - please coordinate if that's what he wants to do. Thanks!

## 2018-06-13 NOTE — Telephone Encounter (Signed)
Entered in error

## 2018-06-17 ENCOUNTER — Ambulatory Visit: Payer: Medicare Other

## 2018-06-17 VITALS — BP 132/86 | HR 72

## 2018-06-17 DIAGNOSIS — M25511 Pain in right shoulder: Secondary | ICD-10-CM | POA: Diagnosis not present

## 2018-06-17 DIAGNOSIS — M6281 Muscle weakness (generalized): Secondary | ICD-10-CM | POA: Diagnosis not present

## 2018-06-17 DIAGNOSIS — R29898 Other symptoms and signs involving the musculoskeletal system: Secondary | ICD-10-CM

## 2018-06-17 DIAGNOSIS — M25611 Stiffness of right shoulder, not elsewhere classified: Secondary | ICD-10-CM | POA: Diagnosis not present

## 2018-06-17 NOTE — Therapy (Addendum)
[facility] [address]  [place], [place], [address] Phone: [phone]   Fax:  [phone]  Physical Therapy Treatment  Patient Details  Name: Lance Hicks MRN: [id number] Date of Birth: 11-06-1946 Referring Provider: Dr. Stann Mainland   Encounter Date: 06/17/2018  PT End of Session - 06/17/18 1104    Visit Number  4    Number of Visits  17    Date for PT Re-Evaluation  07/30/18    Authorization Type  Medicare A[employer] Plan F    PT Start Time  1102    PT Stop Time  1144    PT Time Calculation (min)  42 min    Activity Tolerance  Patient tolerated treatment well    Behavior During Therapy  [facility] for tasks assessed/performed       Past Medical History:  Diagnosis Date  . 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 In light of concurrent left nephrolithiasis, will check CT renal colic next year instead of renal US.   [name] Anticoagulated on Coumadin 01/04/2018  . Anxiety 05/10/2016   Last Assessment & Plan:  Doing well off of zoloft.  . Asymptomatic microscopic hematuria 06/20/2017   Last Assessment & Plan:  Had hematuria workup in [facility] in 2016 which negative CT and cystoscopy. UA with 2+ blood last visit - we discussed recommendation for repeat workup at 5 years or if degree of hematuria progresses.   . Atrial fibrillation ([place]) 03/29/2017  . Atrial flutter ([place]) 03/29/2017  . Chronic allergic rhinitis 04/28/2016   Last Assessment & Plan:  Continue astelin  . Chronic anticoagulation 03/29/2017  . Chronic atrial fibrillation ([place]) 07/23/2015   Last Assessment & Plan:  Coumadin and metoprolol, cardiology referral to establish care.  . Chronic midline back pain 12/14/2015   Last Assessment & Plan:  Pain management referral for further evaluation.  . Chronic prostatitis 07/23/2015   Last Assessment & Plan:  Has largely resolved since stopping bike riding. Recommend annual DRE AND  PSA - will see back 12/2015 for annual screening, given 1st degree fhx. To call office for recurrent prostatitis symptoms.   [name] COPD (chronic obstructive pulmonary disease) ([place]) 06/12/2016  . Coronary artery disease involving native coronary artery of native heart with angina pectoris ([place]) 03/29/2017  . Cough 10/17/2016   Last Assessment & Plan:  Discussed typical course for acute viral illness. If symptoms worsen or fail to improve by 7-10d, delayed ATBs, fluids, rest, NSAIDs/APAP prn. Seek care if not improving. Needs earlier INR check due to ATBs.  [name] Dyspnea 02/01/2016   Last Assessment & Plan:  Overall improving, eval by pulm, plan for CT, neg stress test with cardiology. Recent switch to carvedilol due to side effects.  . Epidermoid cyst of skin 08/24/2017  . Essential hypertension 12/14/2015   Last Assessment & Plan:  Hypertension control: controlled  Medications: compliant Medication Management: as noted in orders Home blood pressure monitoring recommended additionally as needed for symptoms  The patient's care plan was reviewed and updated. Instructions and counseling were provided regarding patient goals and barriers. He was counseled to adopt a healthy lifestyle. Educational resources and self-management tools have been provided as charted in [facility] list.   . H/O maze procedure 03/29/2017  . [name] 03/29/2017   Overview:  2011  . Hx of CABG 03/29/2017  . Hyperlipidemia 03/29/2017  . Hypertensive heart disease 03/29/2017  .  Kidney stones 07/23/2015   Overview:  x 3  Last Assessment & Plan:  By [place] has left nephrolithiasis, but not visible by KUB. Will check CT renal colic next year to assess both stone burden as well as to surveil AML.   . Leukocytoclastic vasculitis ([place]) 10/01/2017  . Localized edema 01/04/2018  . Lumbar radicular pain 01/19/2016  . Maculopapular rash 09/03/2017  . Nephrolithiasis 07/23/2015   Overview:  x 3  Last Assessment & Plan:  By [place] has left  nephrolithiasis, but not visible by KUB. Will check CT renal colic next year to assess both stone burden as well as to surveil AML.   Overview:  x 3  Last Assessment & Plan:  Has 47m nonobstructing LUP stone - not visible by KUB.  Will check renal [place] - he will contact office sooner if symptomatic.   . Non-sustained ventricular tachycardia ([place] 03/29/2017  . Other hyperlipidemia 03/29/2017  . Palpitations 10/01/2017  . Paroxysmal atrial fibrillation ([place] 03/29/2017  . Paroxysmal atrial fibrillation ([place] 03/29/2017  . Pleural effusion, bilateral 08/09/2017  . Post-nasal drainage 02/25/2016   Last Assessment & Plan:  Trial zyrtec and flonase  . Prostate cancer screening 06/20/2017   Last Assessment & Plan:  Recommend continued annual CaP screening until within 10 years of life expectancy. Given good health and fhx of longevity, would anticipate CaP screening to continue until age 72  PSA today and again in one year on day of visit.  . Pulmonary hypertension ([place] 08/09/2017  . Pulmonary nodules 06/12/2016  . S/P AVR (aortic valve replacement) 03/20/2016  . Supratherapeutic INR 07/26/2017  . Syncope 03/29/2017  . Syncope and collapse 02/01/2016  . Typical atrial flutter ([place] 02/01/2016    Past Surgical History:  Procedure Laterality Date  . CHOLECYSTECTOMY    . CORONARY ARTERY BYPASS GRAFT    . EXPLORATORY LAPAROTOMY  07/30/2017  . FOOT SURGERY    . FRACTURE SURGERY Right    wrist and forearm  . HERNIA REPAIR    . MECHANICAL AORTIC VALVE REPLACEMENT    . NASAL SINUS SURGERY    . RIGHT HEART CATH N/A 01/18/2018   Procedure: RIGHT HEART CATH;  Surgeon: BJolaine Artist MD;  [facility];  Service: Cardiovascular;  Laterality: N/A;  . RIGHT HEART CATH N/A 05/14/2018   Procedure: RIGHT HEART CATH;  Surgeon: BJolaine Artist MD;  [facility];  Service: Cardiovascular;  Laterality: N/A;  . TEE WITHOUT CARDIOVERSION N/A 05/21/2018   Procedure: TRANSESOPHAGEAL  ECHOCARDIOGRAM (TEE);  Surgeon: BJolaine Artist MD;  [facility];  Service: Cardiovascular;  Laterality: N/A;  . UPPER GASTROINTESTINAL ENDOSCOPY  07/12/2017   Patchy areas of mucosal inflammation noted in the antrum with edema,erthema and ulcerations. Bx. Chronicfocally active gastritis.  .Marland KitchenVASECTOMY      Vitals:   06/17/18 1106  BP: 132/86  Pulse: 72    Subjective Assessment - 06/17/18 1106    Subjective  Pt. noting continued R biceps soreness which is worst at night.  Has been having some tenderness in R thoracic area which pt. attributes to "pulling too hard on the bands" with HEP.      Pertinent History  a fib, a flutter, chronic anticoagulation, chronic back pain, COPD, CAD, dyspnea, HTN, aortic valve replacement 2018, CABG 2018, HLD, kidney stones, leukocytoclastic vasculitis, pleural effusion, syncope, wrist and forearm fx surgery    Diagnostic tests  R shoulder xray 05/17/18: No acute fracture or dislocation; Osteoarthritic  change noted in the right glenohumeral joint. R shoulder MRI: 05/26: Full-thickness tear of the vast majority of the supraspinatus tendon; Mild distal infraspinatus partial-thickness articular surface tearing. Mild subscapularis tendinopathy. Poor visualization of the intra-articular segment of the long head of the biceps compatible with at least partial tearing.    Currently in Pain?  No/denies    Pain Score  0-No pain    Multiple Pain Sites  No                       [facility] Adult PT Treatment/Exercise - 06/17/18 1109      Shoulder Exercises: ROM/Strengthening   UBE (Upper Arm Bike)  lvl 1.5, 3 min forw/back    Cybex Row  15 reps    Cybex Row Limitations  15#    Other ROM/Strengthening Exercises  BATCA chest press plus 10# x 20 reps       Shoulder Exercises: Stretch   Corner Stretch  2 reps;30 seconds    Corner Stretch Limitations  on doorway chest stretch mid, high     Internal Rotation Stretch  10 seconds x 10 reps      Other Shoulder Stretches  Rhomboids stretch 2 x 30 sec       Manual Therapy   Manual Therapy  Passive ROM;Myofascial release;Soft tissue mobilization    Manual therapy comments  supine     Soft tissue mobilization  STM to R rhomboids, R distal biceps and R forearm extensor group in areas of most tenderness     Passive ROM  R shoulder PROM all directions to pt. tolerance; pain free; some "pulling"             PT Education - 06/17/18 1208    Education Details  HEP update     Person(s) Educated  Patient    Methods  Explanation;Demonstration;Verbal cues;Handout    Comprehension  Verbalized understanding;Returned demonstration;Verbal cues required;Need further instruction       PT Short Term Goals - 06/17/18 1109      PT SHORT TERM GOAL #1   Title  Patient to be independent with inital HEP.    Time  4    Period  Weeks    Status  Achieved        PT Long Term Goals - 06/10/18 1414      PT LONG TERM GOAL #1   Title  Patient to be independent with advanced HEP.    Time  8    Period  Weeks    Status  On-going      PT LONG TERM GOAL #2   Title  Patient to demonstrate R shoulder AROM/PROM WFL without pain limiting.     Time  8    Period  Weeks    Status  On-going      PT LONG TERM GOAL #3   Title  Patient to demonstrate >=4+/5 R UE strength.    Time  8    Period  Weeks    Status  On-going      PT LONG TERM GOAL #4   Title  Patient to demonstrate lifting 15# box overhead with <=1/10 pain.    Time  8    Period  Weeks    Status  On-going            Plan - 06/17/18 1105    Clinical Impression Statement  Lance Hicks reporting some continued tenderness in distal biceps and R forearm which has been bothering  him most at night however notes shoulder has been feeling fine.  Tenderness addressed with manual therapy with some relief following this however Lance Hicks with noted TP in biceps and forearm extensors group.  Pt. did mention today that he is still attending the gym  and performing biceps curls, which he was strongly, encouraged to stop doing today by therapist.  Tolerated all activities in session well today.  HEP updated.  Will continue to progress toward goals.      PT Treatment/Interventions  ADLs/Self Care Home Management;Electrical Stimulation;Cryotherapy;Moist Heat;Therapeutic exercise;Therapeutic activities;Functional mobility training;Ultrasound;Patient/family education;Manual techniques;Vasopneumatic Device;Taping;Splinting;Energy conservation;Dry needling;Passive range of motion    Consulted and Agree with Plan of Care  Patient       Patient will benefit from skilled therapeutic intervention in order to improve the following deficits and impairments:  Hypomobility, Decreased activity tolerance, Decreased strength, Pain, Impaired UE functional use, Decreased range of motion, Impaired flexibility, Postural dysfunction  Visit Diagnosis: Acute pain of right shoulder  Stiffness of right shoulder, not elsewhere classified  Muscle weakness (generalized)  Other symptoms and signs involving the musculoskeletal system     Problem List Patient Active Problem List   Diagnosis Date Noted  . Hypotension, chronic 06/06/2018  . Enterococcal bacteremia   . Pulmonary edema   . Shoulder pain   . PAH (pulmonary artery hypertension) ([place]) 02/20/2018  . Hypokalemia 02/18/2018  . Anticoagulated on Coumadin 01/04/2018  . Localized edema 01/04/2018  . Leukocytoclastic vasculitis ([place]) 10/01/2017  . Palpitations 10/01/2017  . Maculopapular rash 09/03/2017  . Epidermoid cyst of skin 08/24/2017  . Pleural effusion, bilateral 08/09/2017  . Pulmonary hypertension ([place]) 08/09/2017  . Supratherapeutic INR 07/26/2017  . Asymptomatic microscopic hematuria 06/20/2017  . Prostate cancer screening 06/20/2017  . Atrial flutter ([place]) 03/29/2017  . Chronic anticoagulation 03/29/2017  . Coronary artery disease involving native coronary artery of native heart with  angina pectoris ([place]) 03/29/2017  . H/O maze procedure 03/29/2017  . [name] 03/29/2017  . Hx of CABG 03/29/2017  . Hypertensive heart disease with heart failure ([place]) 03/29/2017  . Non-sustained ventricular tachycardia ([place]) 03/29/2017  . Hyperlipidemia 03/29/2017  . Syncope 03/29/2017  . Cough 10/17/2016  . COPD (chronic obstructive pulmonary disease) ([place]) 06/12/2016  . Pulmonary nodules 06/12/2016  . Anxiety 05/10/2016  . 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 Chronic allergic rhinitis 04/28/2016  . History of prosthetic aortic valve 03/20/2016  . Post-nasal drainage 02/25/2016  . Dyspnea 02/01/2016  . Syncope and collapse 02/01/2016  . Typical atrial flutter ([place]) 02/01/2016  . Lumbar radicular pain 01/19/2016  . Chronic midline back pain 12/14/2015  . Essential hypertension 12/14/2015  . Chronic prostatitis 07/23/2015  . Nephrolithiasis 07/23/2015    Bess Harvest, PTA 06/17/18 12:09 PM  [facility] [address]  [place], [place], [address] Phone: [phone]   Fax:  [phone]  Name: Lance Hicks MRN: [id number] Date of Birth: 1946/10/28

## 2018-06-19 ENCOUNTER — Ambulatory Visit: Payer: Medicare Other | Admitting: Physical Therapy

## 2018-06-19 DIAGNOSIS — I2699 Other pulmonary embolism without acute cor pulmonale: Secondary | ICD-10-CM | POA: Diagnosis not present

## 2018-06-19 DIAGNOSIS — J029 Acute pharyngitis, unspecified: Secondary | ICD-10-CM | POA: Diagnosis not present

## 2018-06-20 ENCOUNTER — Institutional Professional Consult (permissible substitution): Payer: Medicare Other | Admitting: Emergency Medicine

## 2018-06-21 DIAGNOSIS — J029 Acute pharyngitis, unspecified: Secondary | ICD-10-CM | POA: Diagnosis not present

## 2018-06-21 DIAGNOSIS — R599 Enlarged lymph nodes, unspecified: Secondary | ICD-10-CM | POA: Diagnosis not present

## 2018-06-21 DIAGNOSIS — R682 Dry mouth, unspecified: Secondary | ICD-10-CM | POA: Diagnosis not present

## 2018-06-21 DIAGNOSIS — I959 Hypotension, unspecified: Secondary | ICD-10-CM | POA: Diagnosis not present

## 2018-06-24 ENCOUNTER — Ambulatory Visit: Payer: Medicare Other

## 2018-06-26 ENCOUNTER — Ambulatory Visit: Payer: Medicare Other | Admitting: Physical Therapy

## 2018-07-01 ENCOUNTER — Ambulatory Visit: Payer: Medicare Other | Attending: Cardiology

## 2018-07-01 DIAGNOSIS — M25511 Pain in right shoulder: Secondary | ICD-10-CM | POA: Diagnosis not present

## 2018-07-01 DIAGNOSIS — M25611 Stiffness of right shoulder, not elsewhere classified: Secondary | ICD-10-CM | POA: Insufficient documentation

## 2018-07-01 DIAGNOSIS — M6281 Muscle weakness (generalized): Secondary | ICD-10-CM

## 2018-07-01 DIAGNOSIS — R29898 Other symptoms and signs involving the musculoskeletal system: Secondary | ICD-10-CM | POA: Diagnosis not present

## 2018-07-01 NOTE — Therapy (Signed)
[facility] [address]  [place], [place], [address] Phone: [phone]   Fax:  [phone]  Physical Therapy Treatment  Patient Details  Name: Lance Hicks MRN: [id number] Date of Birth: [birth year] Referring Provider: Dr. Stann Mainland   Encounter Date: 07/01/2018  PT End of Session - 07/01/18 1322    Visit Number  5    Number of Visits  17    Date for PT Re-Evaluation  07/30/18    Authorization Type  Medicare [employer] Plan F    PT Start Time  1318    PT Stop Time  1415    PT Time Calculation (min)  57 min    Activity Tolerance  Patient tolerated treatment well    Behavior During Therapy  [facility] for tasks assessed/performed       Past Medical History:  Diagnosis Date  . 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 In light of concurrent left nephrolithiasis, will check CT renal colic next year instead of renal US.   [name] Anticoagulated on Coumadin 01/04/2018  . Anxiety 05/10/2016   Last Assessment & Plan:  Doing well off of zoloft.  . Asymptomatic microscopic hematuria 06/20/2017   Last Assessment & Plan:  Had hematuria workup in [facility] in 2016 which negative CT and cystoscopy. UA with 2+ blood last visit - we discussed recommendation for repeat workup at 5 years or if degree of hematuria progresses.   . Atrial fibrillation ([place]) 03/29/2017  . Atrial flutter ([place]) 03/29/2017  . Chronic allergic rhinitis 04/28/2016   Last Assessment & Plan:  Continue astelin  . Chronic anticoagulation 03/29/2017  . Chronic atrial fibrillation ([place]) 07/23/2015   Last Assessment & Plan:  Coumadin and metoprolol, cardiology referral to establish care.  . Chronic midline back pain 12/14/2015   Last Assessment & Plan:  Pain management referral for further evaluation.  . Chronic prostatitis 07/23/2015   Last Assessment & Plan:  Has largely resolved since stopping bike riding. Recommend annual DRE AND  PSA - will see back 12/2015 for annual screening, given 1st degree fhx. To call office for recurrent prostatitis symptoms.   [name] COPD (chronic obstructive pulmonary disease) ([place]) 06/12/2016  . Coronary artery disease involving native coronary artery of native heart with angina pectoris ([place]) 03/29/2017  . Cough 10/17/2016   Last Assessment & Plan:  Discussed typical course for acute viral illness. If symptoms worsen or fail to improve by 7-10d, delayed ATBs, fluids, rest, NSAIDs/APAP prn. Seek care if not improving. Needs earlier INR check due to ATBs.  [name] Dyspnea 02/01/2016   Last Assessment & Plan:  Overall improving, eval by pulm, plan for CT, neg stress test with cardiology. Recent switch to carvedilol due to side effects.  . Epidermoid cyst of skin 08/24/2017  . Essential hypertension 12/14/2015   Last Assessment & Plan:  Hypertension control: controlled  Medications: compliant Medication Management: as noted in orders Home blood pressure monitoring recommended additionally as needed for symptoms  The patient's care plan was reviewed and updated. Instructions and counseling were provided regarding patient goals and barriers. He was counseled to adopt a healthy lifestyle. Educational resources and self-management tools have been provided as charted in [facility] list.   . H/O maze procedure 03/29/2017  . [name] 03/29/2017   Overview:  2011  . Hx of CABG 03/29/2017  . Hyperlipidemia 03/29/2017  . Hypertensive heart disease 03/29/2017  .  Kidney stones 07/23/2015   Overview:  x 3  Last Assessment & Plan:  By [place] has left nephrolithiasis, but not visible by KUB. Will check CT renal colic next year to assess both stone burden as well as to surveil AML.   . Leukocytoclastic vasculitis ([place]) 10/01/2017  . Localized edema 01/04/2018  . Lumbar radicular pain 01/19/2016  . Maculopapular rash 09/03/2017  . Nephrolithiasis 07/23/2015   Overview:  x 3  Last Assessment & Plan:  By [place] has left  nephrolithiasis, but not visible by KUB. Will check CT renal colic next year to assess both stone burden as well as to surveil AML.   Overview:  x 3  Last Assessment & Plan:  Has 84m nonobstructing LUP stone - not visible by KUB.  Will check renal [place] - he will contact office sooner if symptomatic.   . Non-sustained ventricular tachycardia ([place] 03/29/2017  . Other hyperlipidemia 03/29/2017  . Palpitations 10/01/2017  . Paroxysmal atrial fibrillation ([place] 03/29/2017  . Paroxysmal atrial fibrillation ([place] 03/29/2017  . Pleural effusion, bilateral 08/09/2017  . Post-nasal drainage 02/25/2016   Last Assessment & Plan:  Trial zyrtec and flonase  . Prostate cancer screening 06/20/2017   Last Assessment & Plan:  Recommend continued annual CaP screening until within 10 years of life expectancy. Given good health and fhx of longevity, would anticipate CaP screening to continue until age 72  PSA today and again in one year on day of visit.  . Pulmonary hypertension ([place] 08/09/2017  . Pulmonary nodules 06/12/2016  . S/P AVR (aortic valve replacement) 03/20/2016  . Supratherapeutic INR 07/26/2017  . Syncope 03/29/2017  . Syncope and collapse 02/01/2016  . Typical atrial flutter ([place] 02/01/2016    Past Surgical History:  Procedure Laterality Date  . CHOLECYSTECTOMY    . CORONARY ARTERY BYPASS GRAFT    . EXPLORATORY LAPAROTOMY  07/30/2017  . FOOT SURGERY    . FRACTURE SURGERY Right    wrist and forearm  . HERNIA REPAIR    . MECHANICAL AORTIC VALVE REPLACEMENT    . NASAL SINUS SURGERY    . RIGHT HEART CATH N/A 01/18/2018   Procedure: RIGHT HEART CATH;  Surgeon: BJolaine Artist MD;  [facility];  Service: Cardiovascular;  Laterality: N/A;  . RIGHT HEART CATH N/A 05/14/2018   Procedure: RIGHT HEART CATH;  Surgeon: BJolaine Artist MD;  [facility];  Service: Cardiovascular;  Laterality: N/A;  . TEE WITHOUT CARDIOVERSION N/A 05/21/2018   Procedure: TRANSESOPHAGEAL  ECHOCARDIOGRAM (TEE);  Surgeon: BJolaine Artist MD;  [facility];  Service: Cardiovascular;  Laterality: N/A;  . UPPER GASTROINTESTINAL ENDOSCOPY  07/12/2017   Patchy areas of mucosal inflammation noted in the antrum with edema,erthema and ulcerations. Bx. Chronicfocally active gastritis.  .Marland KitchenVASECTOMY      There were no vitals filed for this visit.  Subjective Assessment - 07/01/18 1319    Subjective  Pt. noting he had a "pulling" sensation last week while picking up a bucket full of water which he thought was empty.  Pt. noting he had increased R shoulder pain the next morning which has bothered him since then.      Pertinent History  a fib, a flutter, chronic anticoagulation, chronic back pain, COPD, CAD, dyspnea, HTN, aortic valve replacement 2018, CABG 2018, HLD, kidney stones, leukocytoclastic vasculitis, pleural effusion, syncope, wrist and forearm fx surgery    Diagnostic tests  R shoulder xray 05/17/18: No acute fracture or  dislocation; Osteoarthritic change noted in the right glenohumeral joint. R shoulder MRI: 05/26: Full-thickness tear of the vast majority of the supraspinatus tendon; Mild distal infraspinatus partial-thickness articular surface tearing. Mild subscapularis tendinopathy. Poor visualization of the intra-articular segment of the long head of the biceps compatible with at least partial tearing.    Currently in Pain?  Yes    Pain Score  2     Pain Location  Shoulder Pt. noting rise in pain 10/10 with R shoulder elevation motions     Pain Orientation  Right    Pain Descriptors / Indicators  Constant;Sharp    Pain Type  Acute pain    Pain Onset  1 to 4 weeks ago    Pain Frequency  Constant    Aggravating Factors   raising arm forward and to sides    Pain Relieving Factors  ice    Multiple Pain Sites  No         [facility] PT Assessment - 07/01/18 1325      AROM   Right/Left Shoulder  Right    Right Shoulder Flexion  35 Degrees significant R shoulder pain  increase     Right Shoulder ABduction  50 Degrees      PROM   PROM Assessment Site  Shoulder    Right/Left Shoulder  Right    Right Shoulder Flexion  127 Degrees    Right Shoulder ABduction  110 Degrees    Right Shoulder Internal Rotation  48 Degrees    Right Shoulder External Rotation  59 Degrees      Strength   Right/Left Shoulder  Right    Right Shoulder Flexion  3-/5    Right Shoulder ABduction  3-/5    Right Shoulder Internal Rotation  4/5    Right Shoulder External Rotation  4-/5                   [facility] Adult PT Treatment/Exercise - 07/01/18 1338      Self-Care   Self-Care  Other Self-Care Comments    Other Self-Care Comments   discussion with supervising PT and therapist regarding nature of "flare-up" while picking up ~ 40# bucket of water with R shoulder       Shoulder Exercises: Supine   External Rotation  AAROM;Right;10 reps increased pain     External Rotation Limitations  wand     Flexion  Right;AAROM;10 reps increased pain     Flexion Limitations  wand     ABduction  Right;AAROM;10 reps increased pain     ABduction Limitations  wand       Modalities   Modalities  Vasopneumatic      Vasopneumatic   Number Minutes Vasopneumatic   15 minutes    Vasopnuematic Location   Shoulder R    Vasopneumatic Pressure  Low    Vasopneumatic Temperature   coldest temp.       Manual Therapy   Manual Therapy  Passive ROM;Myofascial release;Soft tissue mobilization    Manual therapy comments  supine     Soft tissue mobilization  STM R shoulder complex and R biceps UT     Myofascial Release  TPR to R posterior shoulder musculature     Passive ROM  R shoulder PROM all directions with R shoulder pain in all end ranges and increased muscular guarding                PT Short Term Goals - 06/17/18 1109  PT SHORT TERM GOAL #1   Title  Patient to be independent with inital HEP.    Time  4    Period  Weeks    Status  Achieved        PT Long Term  Goals - 06/10/18 1414      PT LONG TERM GOAL #1   Title  Patient to be independent with advanced HEP.    Time  8    Period  Weeks    Status  On-going      PT LONG TERM GOAL #2   Title  Patient to demonstrate R shoulder AROM/PROM WFL without pain limiting.     Time  8    Period  Weeks    Status  On-going      PT LONG TERM GOAL #3   Title  Patient to demonstrate >=4+/5 R UE strength.    Time  8    Period  Weeks    Status  On-going      PT LONG TERM GOAL #4   Title  Patient to demonstrate lifting 15# box overhead with <=1/10 pain.    Time  8    Period  Weeks    Status  On-going            Plan - 07/01/18 1331    Clinical Impression Statement  Pt. 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  Pt. presenting today with weakness and significant pain/limitation with flexion and scaption elevation activities.  Pt. TTP throughout shoulder complex and posterior RTC today with palpable increased tension in this musculature.  Pt. with difficulty performing supine level cane AAROM activities today however somewhat improved tolerance following manual therapy.  Pt. demonstrating significantly decreased AROM all planes today.  Pt. to see MD for f/u on 7.9.19 and encouraged to discuss recent shoulder set-back with MD.  Will continue to progress toward goals.      PT Treatment/Interventions  ADLs/Self Care Home Management;Electrical Stimulation;Cryotherapy;Moist Heat;Therapeutic exercise;Therapeutic activities;Functional mobility training;Ultrasound;Patient/family education;Manual techniques;Vasopneumatic Device;Taping;Splinting;Energy conservation;Dry needling;Passive range of motion    Consulted and Agree with Plan of Care  Patient       Patient will benefit from skilled therapeutic intervention in order to improve the following deficits and impairments:   Hypomobility, Decreased activity tolerance, Decreased strength, Pain, Impaired UE functional use, Decreased range of motion, Impaired flexibility, Postural dysfunction  Visit Diagnosis: Acute pain of right shoulder  Stiffness of right shoulder, not elsewhere classified  Muscle weakness (generalized)  Other symptoms and signs involving the musculoskeletal system     Problem List Patient Active Problem List   Diagnosis Date Noted  . Hypotension, chronic 06/06/2018  . Enterococcal bacteremia   . Pulmonary edema   . Shoulder pain   . PAH (pulmonary artery hypertension) ([place]) 02/20/2018  . Hypokalemia 02/18/2018  . Anticoagulated on Coumadin 01/04/2018  . Localized edema 01/04/2018  . Leukocytoclastic vasculitis ([place]) 10/01/2017  . Palpitations 10/01/2017  . Maculopapular rash 09/03/2017  . Epidermoid cyst of skin 08/24/2017  . Pleural effusion, bilateral 08/09/2017  . Pulmonary hypertension ([place]) 08/09/2017  . Supratherapeutic INR 07/26/2017  . Asymptomatic microscopic hematuria 06/20/2017  . Prostate cancer screening 06/20/2017  . Atrial flutter ([place]) 03/29/2017  . Chronic anticoagulation 03/29/2017  . Coronary artery disease involving native coronary artery of native heart with angina pectoris ([place]) 03/29/2017  .  H/O maze procedure 03/29/2017  . [name] 03/29/2017  . Hx of CABG 03/29/2017  . Hypertensive heart disease with heart failure ([place]) 03/29/2017  . Non-sustained ventricular tachycardia ([place]) 03/29/2017  . Hyperlipidemia 03/29/2017  . Syncope 03/29/2017  . Cough 10/17/2016  . COPD (chronic obstructive pulmonary disease) ([place]) 06/12/2016  . Pulmonary nodules 06/12/2016  . Anxiety 05/10/2016  . 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 Chronic allergic rhinitis 04/28/2016  . History of prosthetic aortic valve 03/20/2016  . Post-nasal drainage 02/25/2016  . Dyspnea 02/01/2016  . Syncope and collapse 02/01/2016  . Typical  atrial flutter ([place]) 02/01/2016  . Lumbar radicular pain 01/19/2016  . Chronic midline back pain 12/14/2015  . Essential hypertension 12/14/2015  . Chronic prostatitis 07/23/2015  . Nephrolithiasis 07/23/2015    Bess Harvest, PTA 07/01/18 5:01 PM    [facility] [address]  [place], [place], [address] Phone: [phone]   Fax:  [phone]  Name: Lance Hicks MRN: [id number] Date of Birth: [birth year]

## 2018-07-03 ENCOUNTER — Ambulatory Visit: Payer: Medicare Other | Admitting: Physical Therapy

## 2018-07-03 ENCOUNTER — Encounter: Payer: Self-pay | Admitting: Physical Therapy

## 2018-07-03 VITALS — BP 118/70 | HR 91

## 2018-07-03 DIAGNOSIS — M25511 Pain in right shoulder: Secondary | ICD-10-CM

## 2018-07-03 DIAGNOSIS — M25611 Stiffness of right shoulder, not elsewhere classified: Secondary | ICD-10-CM | POA: Diagnosis not present

## 2018-07-03 DIAGNOSIS — R591 Generalized enlarged lymph nodes: Secondary | ICD-10-CM | POA: Diagnosis not present

## 2018-07-03 DIAGNOSIS — R29898 Other symptoms and signs involving the musculoskeletal system: Secondary | ICD-10-CM

## 2018-07-03 DIAGNOSIS — M6281 Muscle weakness (generalized): Secondary | ICD-10-CM | POA: Diagnosis not present

## 2018-07-03 DIAGNOSIS — Z952 Presence of prosthetic heart valve: Secondary | ICD-10-CM | POA: Diagnosis not present

## 2018-07-03 NOTE — Therapy (Signed)
[facility] [address]  [place], [place], [address] Phone: [phone]   Fax:  [phone]  Physical Therapy Treatment  Patient Details  Name: Lance Hicks MRN: [id number] Date of Birth: November 03, 1946 Referring Provider: Dr. Stann Mainland   Encounter Date: 07/03/2018  PT End of Session - 07/03/18 1548    Visit Number  6    Number of Visits  17    Date for PT Re-Evaluation  07/30/18    Authorization Type  Medicare [place] [employer]    PT Start Time  3299    PT Stop Time  1457 ice pack    PT Time Calculation (min)  50 min    Activity Tolerance  Patient tolerated treatment well;Patient limited by pain    Behavior During Therapy  [facility] for tasks assessed/performed       Past Medical History:  Diagnosis Date  . 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 In light of concurrent left nephrolithiasis, will check CT renal colic next year instead of renal US.   [name] Anticoagulated on Coumadin 01/04/2018  . Anxiety 05/10/2016   Last Assessment & Plan:  Doing well off of zoloft.  . Asymptomatic microscopic hematuria 06/20/2017   Last Assessment & Plan:  Had hematuria workup in [facility] in 2016 which negative CT and cystoscopy. UA with 2+ blood last visit - we discussed recommendation for repeat workup at 5 years or if degree of hematuria progresses.   . Atrial fibrillation ([place]) 03/29/2017  . Atrial flutter ([place]) 03/29/2017  . Chronic allergic rhinitis 04/28/2016   Last Assessment & Plan:  Continue astelin  . Chronic anticoagulation 03/29/2017  . Chronic atrial fibrillation ([place]) 07/23/2015   Last Assessment & Plan:  Coumadin and metoprolol, cardiology referral to establish care.  . Chronic midline back pain 12/14/2015   Last Assessment & Plan:  Pain management referral for further evaluation.  . Chronic prostatitis 07/23/2015   Last Assessment & Plan:  Has largely resolved since stopping bike  riding. Recommend annual DRE AND PSA - will see back 12/2015 for annual screening, given 1st degree fhx. To call office for recurrent prostatitis symptoms.   [name] COPD (chronic obstructive pulmonary disease) ([place]) 06/12/2016  . Coronary artery disease involving native coronary artery of native heart with angina pectoris ([place]) 03/29/2017  . Cough 10/17/2016   Last Assessment & Plan:  Discussed typical course for acute viral illness. If symptoms worsen or fail to improve by 7-10d, delayed ATBs, fluids, rest, NSAIDs/APAP prn. Seek care if not improving. Needs earlier INR check due to ATBs.  [name] Dyspnea 02/01/2016   Last Assessment & Plan:  Overall improving, eval by pulm, plan for CT, neg stress test with cardiology. Recent switch to carvedilol due to side effects.  . Epidermoid cyst of skin 08/24/2017  . Essential hypertension 12/14/2015   Last Assessment & Plan:  Hypertension control: controlled  Medications: compliant Medication Management: as noted in orders Home blood pressure monitoring recommended additionally as needed for symptoms  The patient's care plan was reviewed and updated. Instructions and counseling were provided regarding patient goals and barriers. He was counseled to adopt a healthy lifestyle. Educational resources and self-management tools have been provided as charted in [facility] list.   . H/O maze procedure 03/29/2017  . [name] 03/29/2017   Overview:  2011  . Hx of CABG 03/29/2017  . Hyperlipidemia 03/29/2017  .  Hypertensive heart disease 03/29/2017  . Kidney stones 07/23/2015   Overview:  x 3  Last Assessment & Plan:  By [place] has left nephrolithiasis, but not visible by KUB. Will check CT renal colic next year to assess both stone burden as well as to surveil AML.   . Leukocytoclastic vasculitis ([place]) 10/01/2017  . Localized edema 01/04/2018  . Lumbar radicular pain 01/19/2016  . Maculopapular rash 09/03/2017  . Nephrolithiasis 07/23/2015   Overview:  x 3  Last Assessment &  Plan:  By [place] has left nephrolithiasis, but not visible by KUB. Will check CT renal colic next year to assess both stone burden as well as to surveil AML.   Overview:  x 3  Last Assessment & Plan:  Has 72m nonobstructing LUP stone - not visible by KUB.  Will check renal [place] - he will contact office sooner if symptomatic.   . Non-sustained ventricular tachycardia ([place] 03/29/2017  . Other hyperlipidemia 03/29/2017  . Palpitations 10/01/2017  . Paroxysmal atrial fibrillation ([place] 03/29/2017  . Paroxysmal atrial fibrillation ([place] 03/29/2017  . Pleural effusion, bilateral 08/09/2017  . Post-nasal drainage 02/25/2016   Last Assessment & Plan:  Trial zyrtec and flonase  . Prostate cancer screening 06/20/2017   Last Assessment & Plan:  Recommend continued annual CaP screening until within 10 years of life expectancy. Given good health and fhx of longevity, would anticipate CaP screening to continue until age 72  PSA today and again in one year on day of visit.  . Pulmonary hypertension ([place] 08/09/2017  . Pulmonary nodules 06/12/2016  . S/P AVR (aortic valve replacement) 03/20/2016  . Supratherapeutic INR 07/26/2017  . Syncope 03/29/2017  . Syncope and collapse 02/01/2016  . Typical atrial flutter ([place] 02/01/2016    Past Surgical History:  Procedure Laterality Date  . CHOLECYSTECTOMY    . CORONARY ARTERY BYPASS GRAFT    . EXPLORATORY LAPAROTOMY  07/30/2017  . FOOT SURGERY    . FRACTURE SURGERY Right    wrist and forearm  . HERNIA REPAIR    . MECHANICAL AORTIC VALVE REPLACEMENT    . NASAL SINUS SURGERY    . RIGHT HEART CATH N/A 01/18/2018   Procedure: RIGHT HEART CATH;  Surgeon: BJolaine Artist MD;  [facility];  Service: Cardiovascular;  Laterality: N/A;  . RIGHT HEART CATH N/A 05/14/2018   Procedure: RIGHT HEART CATH;  Surgeon: BJolaine Artist MD;  [facility];  Service: Cardiovascular;  Laterality: N/A;  . TEE WITHOUT CARDIOVERSION N/A 05/21/2018   Procedure:  TRANSESOPHAGEAL ECHOCARDIOGRAM (TEE);  Surgeon: BJolaine Artist MD;  [facility];  Service: Cardiovascular;  Laterality: N/A;  . UPPER GASTROINTESTINAL ENDOSCOPY  07/12/2017   Patchy areas of mucosal inflammation noted in the antrum with edema,erthema and ulcerations. Bx. Chronicfocally active gastritis.  .Marland KitchenVASECTOMY      Vitals:   07/03/18 1408  BP: 118/70  Pulse: 91  SpO2: 93%    Subjective Assessment - 07/03/18 1412    Subjective  Had to sleep in chair rather than bed last night d/t R shoulder pain. Reports some improvement in R shoulder since last visit.    Pertinent History  a fib, a flutter, chronic anticoagulation, chronic back pain, COPD, CAD, dyspnea, HTN, aortic valve replacement 2018, CABG 2018, HLD, kidney stones, leukocytoclastic vasculitis, pleural effusion, syncope, wrist and forearm fx surgery    Diagnostic tests  R shoulder xray 05/17/18: No acute fracture or dislocation; Osteoarthritic change noted in  the right glenohumeral joint. R shoulder MRI: 05/26: Full-thickness tear of the vast majority of the supraspinatus tendon; Mild distal infraspinatus partial-thickness articular surface tearing. Mild subscapularis tendinopathy. Poor visualization of the intra-articular segment of the long head of the biceps compatible with at least partial tearing.    Currently in Pain?  No/denies                       [facility] Adult PT Treatment/Exercise - 07/03/18 0001      Exercises   Exercises  Shoulder      Shoulder Exercises: Supine   External Rotation  AAROM;Right;10 reps    External Rotation Limitations  wand; dowel under R elbow to ensure neutral shoulder    Flexion  Right;AAROM;10 reps    Flexion Limitations  wand; unable to tolerate full range d/t pain in distal biceps    ABduction  Right;AAROM;10 reps    ABduction Limitations  wand; unable to tolerate d/t pain in distal biceps and shoulder      Shoulder Exercises: Seated   Retraction   Strengthening;Both;10 reps;Limitations    Retraction Limitations  10x3" cues to avoid shoulder hiking    Row  Strengthening;Both    Row Limitations  Unable to tolerate d/t R shoulder and biceps pain      Shoulder Exercises: Sidelying   External Rotation  Right;10 reps    External Rotation Limitations  dowel under R elbow to keep shoulder neutral      Shoulder Exercises: Pulleys   Flexion  3 minutes    Scaption  3 minutes      Modalities   Modalities  Cryotherapy      Cryotherapy   Number Minutes Cryotherapy  19 Minutes    Cryotherapy Location  Shoulder R    Type of Cryotherapy  Ice pack      Manual Therapy   Manual Therapy  Soft tissue mobilization;Passive ROM    Manual therapy comments  supine     Soft tissue mobilization  STM to R infraspinatus/teres min, distal biceps, pec- soft tissue restriction and tenderness in these areas    Myofascial Release  --    Passive ROM  Gentle R shoulder PROM all directions with R shoulder pain in all end ranges and increased muscular guarding; worst with IR               PT Short Term Goals - 06/17/18 1109      PT SHORT TERM GOAL #1   Title  Patient to be independent with inital HEP.    Time  4    Period  Weeks    Status  Achieved        PT Long Term Goals - 06/10/18 1414      PT LONG TERM GOAL #1   Title  Patient to be independent with advanced HEP.    Time  8    Period  Weeks    Status  On-going      PT LONG TERM GOAL #2   Title  Patient to demonstrate R shoulder AROM/PROM WFL without pain limiting.     Time  8    Period  Weeks    Status  On-going      PT LONG TERM GOAL #3   Title  Patient to demonstrate >=4+/5 R UE strength.    Time  8    Period  Weeks    Status  On-going      PT LONG TERM  GOAL #4   Title  Patient to demonstrate lifting 15# box overhead with <=1/10 pain.    Time  8    Period  Weeks    Status  On-going            Plan - 07/03/18 1548    Clinical Impression Statement  Patient  arrived to session with report that R shoulder pain is mildly improved since last session, but continued pain in R bicep and shoulder as well as ROM restriction remains. Vitals WNL at beginning of session. Patient tolerated gentle PROM to R shoulder but c/o moderate-severe pain when returning UE from end range. Also tolerated STM to R infraspinatus/teres min, distal biceps, pec- soft tissue restriction and tenderness in these areas and mild relief after STM. Patient unable to tolerate R shoulder AAROM abduction or resisted row d/t pain- discontinued these exercises. Spoke with patient about concern for full RTC tear and advised to avoid strenuous activities. Patient reported understanding. Patient received ice at end of session- report of decreased pain.     PT Treatment/Interventions  ADLs/Self Care Home Management;Electrical Stimulation;Cryotherapy;Moist Heat;Therapeutic exercise;Therapeutic activities;Functional mobility training;Ultrasound;Patient/family education;Manual techniques;Vasopneumatic Device;Taping;Splinting;Energy conservation;Dry needling;Passive range of motion    PT Next Visit Plan  reassess AAROM    Consulted and Agree with Plan of Care  Patient       Patient will benefit from skilled therapeutic intervention in order to improve the following deficits and impairments:  Hypomobility, Decreased activity tolerance, Decreased strength, Pain, Impaired UE functional use, Decreased range of motion, Impaired flexibility, Postural dysfunction  Visit Diagnosis: Acute pain of right shoulder  Stiffness of right shoulder, not elsewhere classified  Muscle weakness (generalized)  Other symptoms and signs involving the musculoskeletal system     Problem List Patient Active Problem List   Diagnosis Date Noted  . Hypotension, chronic 06/06/2018  . Enterococcal bacteremia   . Pulmonary edema   . Shoulder pain   . PAH (pulmonary artery hypertension) ([place]) 02/20/2018  . Hypokalemia  02/18/2018  . Anticoagulated on Coumadin 01/04/2018  . Localized edema 01/04/2018  . Leukocytoclastic vasculitis ([place]) 10/01/2017  . Palpitations 10/01/2017  . Maculopapular rash 09/03/2017  . Epidermoid cyst of skin 08/24/2017  . Pleural effusion, bilateral 08/09/2017  . Pulmonary hypertension ([place]) 08/09/2017  . Supratherapeutic INR 07/26/2017  . Asymptomatic microscopic hematuria 06/20/2017  . Prostate cancer screening 06/20/2017  . Atrial flutter ([place]) 03/29/2017  . Chronic anticoagulation 03/29/2017  . Coronary artery disease involving native coronary artery of native heart with angina pectoris ([place]) 03/29/2017  . H/O maze procedure 03/29/2017  . [name] 03/29/2017  . Hx of CABG 03/29/2017  . Hypertensive heart disease with heart failure ([place]) 03/29/2017  . Non-sustained ventricular tachycardia ([place]) 03/29/2017  . Hyperlipidemia 03/29/2017  . Syncope 03/29/2017  . Cough 10/17/2016  . COPD (chronic obstructive pulmonary disease) ([place]) 06/12/2016  . Pulmonary nodules 06/12/2016  . Anxiety 05/10/2016  . 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 Chronic allergic rhinitis 04/28/2016  . History of prosthetic aortic valve 03/20/2016  . Post-nasal drainage 02/25/2016  . Dyspnea 02/01/2016  . Syncope and collapse 02/01/2016  . Typical atrial flutter ([place]) 02/01/2016  . Lumbar radicular pain 01/19/2016  . Chronic midline back pain 12/14/2015  . Essential hypertension 12/14/2015  . Chronic prostatitis 07/23/2015  . Nephrolithiasis 07/23/2015     Janene Harvey, PT, DPT 07/03/18 3:55 PM   [facility] [address]  [address] [place],  [place], [address] Phone: [phone]   Fax:  [phone]  Name: Lance Hicks MRN: [id number] Date of Birth: June 11, 1946

## 2018-07-05 DIAGNOSIS — Z952 Presence of prosthetic heart valve: Secondary | ICD-10-CM | POA: Diagnosis not present

## 2018-07-08 ENCOUNTER — Ambulatory Visit: Payer: Medicare Other | Admitting: Physical Therapy

## 2018-07-08 DIAGNOSIS — Z952 Presence of prosthetic heart valve: Secondary | ICD-10-CM | POA: Diagnosis not present

## 2018-07-11 ENCOUNTER — Ambulatory Visit: Payer: Medicare Other

## 2018-07-11 DIAGNOSIS — M25611 Stiffness of right shoulder, not elsewhere classified: Secondary | ICD-10-CM | POA: Diagnosis not present

## 2018-07-11 DIAGNOSIS — M25511 Pain in right shoulder: Secondary | ICD-10-CM

## 2018-07-11 DIAGNOSIS — M6281 Muscle weakness (generalized): Secondary | ICD-10-CM

## 2018-07-11 DIAGNOSIS — R29898 Other symptoms and signs involving the musculoskeletal system: Secondary | ICD-10-CM | POA: Diagnosis not present

## 2018-07-11 NOTE — Therapy (Signed)
[facility] [address]  [place], [place], [address] Phone: [phone]   Fax:  [phone]  Physical Therapy Treatment  Patient Details  Name: Lance Hicks MRN: [id number] Date of Birth: September 28, 1946 Referring Provider: Dr. Stann Mainland   Encounter Date: 07/11/2018  PT End of Session - 07/11/18 1505    Visit Number  7    Number of Visits  17    Date for PT Re-Evaluation  07/30/18    Authorization Type  Medicare [place] [facility]    PT Start Time  1449    PT Stop Time  1540    PT Time Calculation (min)  51 min    Activity Tolerance  Patient tolerated treatment well;Patient limited by pain    Behavior During Therapy  [facility] for tasks assessed/performed       Past Medical History:  Diagnosis Date  . 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 In light of concurrent left nephrolithiasis, will check CT renal colic next year instead of renal US.   [name] Anticoagulated on Coumadin 01/04/2018  . Anxiety 05/10/2016   Last Assessment & Plan:  Doing well off of zoloft.  . Asymptomatic microscopic hematuria 06/20/2017   Last Assessment & Plan:  Had hematuria workup in [facility] in 2016 which negative CT and cystoscopy. UA with 2+ blood last visit - we discussed recommendation for repeat workup at 5 years or if degree of hematuria progresses.   . Atrial fibrillation ([place]) 03/29/2017  . Atrial flutter ([place]) 03/29/2017  . Chronic allergic rhinitis 04/28/2016   Last Assessment & Plan:  Continue astelin  . Chronic anticoagulation 03/29/2017  . Chronic atrial fibrillation ([place]) 07/23/2015   Last Assessment & Plan:  Coumadin and metoprolol, cardiology referral to establish care.  . Chronic midline back pain 12/14/2015   Last Assessment & Plan:  Pain management referral for further evaluation.  . Chronic prostatitis 07/23/2015   Last Assessment & Plan:  Has largely resolved since stopping bike riding.  Recommend annual DRE AND PSA - will see back 12/2015 for annual screening, given 1st degree fhx. To call office for recurrent prostatitis symptoms.   [name] COPD (chronic obstructive pulmonary disease) ([place]) 06/12/2016  . Coronary artery disease involving native coronary artery of native heart with angina pectoris ([place]) 03/29/2017  . Cough 10/17/2016   Last Assessment & Plan:  Discussed typical course for acute viral illness. If symptoms worsen or fail to improve by 7-10d, delayed ATBs, fluids, rest, NSAIDs/APAP prn. Seek care if not improving. Needs earlier INR check due to ATBs.  [name] Dyspnea 02/01/2016   Last Assessment & Plan:  Overall improving, eval by pulm, plan for CT, neg stress test with cardiology. Recent switch to carvedilol due to side effects.  . Epidermoid cyst of skin 08/24/2017  . Essential hypertension 12/14/2015   Last Assessment & Plan:  Hypertension control: controlled  Medications: compliant Medication Management: as noted in orders Home blood pressure monitoring recommended additionally as needed for symptoms  The patient's care plan was reviewed and updated. Instructions and counseling were provided regarding patient goals and barriers. He was counseled to adopt a healthy lifestyle. Educational resources and self-management tools have been provided as charted in [facility] list.   . H/O maze procedure 03/29/2017  . [name] 03/29/2017   Overview:  2011  . Hx of CABG 03/29/2017  . Hyperlipidemia 03/29/2017  . Hypertensive  heart disease 03/29/2017  . Kidney stones 07/23/2015   Overview:  x 3  Last Assessment & Plan:  By [place] has left nephrolithiasis, but not visible by KUB. Will check CT renal colic next year to assess both stone burden as well as to surveil AML.   . Leukocytoclastic vasculitis ([place]) 10/01/2017  . Localized edema 01/04/2018  . Lumbar radicular pain 01/19/2016  . Maculopapular rash 09/03/2017  . Nephrolithiasis 07/23/2015   Overview:  x 3  Last Assessment & Plan:   By [place] has left nephrolithiasis, but not visible by KUB. Will check CT renal colic next year to assess both stone burden as well as to surveil AML.   Overview:  x 3  Last Assessment & Plan:  Has 72m nonobstructing LUP stone - not visible by KUB.  Will check renal [place] - he will contact office sooner if symptomatic.   . Non-sustained ventricular tachycardia ([place] 03/29/2017  . Other hyperlipidemia 03/29/2017  . Palpitations 10/01/2017  . Paroxysmal atrial fibrillation ([place] 03/29/2017  . Paroxysmal atrial fibrillation ([place] 03/29/2017  . Pleural effusion, bilateral 08/09/2017  . Post-nasal drainage 02/25/2016   Last Assessment & Plan:  Trial zyrtec and flonase  . Prostate cancer screening 06/20/2017   Last Assessment & Plan:  Recommend continued annual CaP screening until within 10 years of life expectancy. Given good health and fhx of longevity, would anticipate CaP screening to continue until age 89  PSA today and again in one year on day of visit.  . Pulmonary hypertension ([place] 08/09/2017  . Pulmonary nodules 06/12/2016  . S/P AVR (aortic valve replacement) 03/20/2016  . Supratherapeutic INR 07/26/2017  . Syncope 03/29/2017  . Syncope and collapse 02/01/2016  . Typical atrial flutter ([place] 02/01/2016    Past Surgical History:  Procedure Laterality Date  . CHOLECYSTECTOMY    . CORONARY ARTERY BYPASS GRAFT    . EXPLORATORY LAPAROTOMY  07/30/2017  . FOOT SURGERY    . FRACTURE SURGERY Right    wrist and forearm  . HERNIA REPAIR    . MECHANICAL AORTIC VALVE REPLACEMENT    . NASAL SINUS SURGERY    . RIGHT HEART CATH N/A 01/18/2018   Procedure: RIGHT HEART CATH;  Surgeon: BJolaine Artist MD;  [facility];  Service: Cardiovascular;  Laterality: N/A;  . RIGHT HEART CATH N/A 05/14/2018   Procedure: RIGHT HEART CATH;  Surgeon: BJolaine Artist MD;  [facility];  Service: Cardiovascular;  Laterality: N/A;  . TEE WITHOUT CARDIOVERSION N/A 05/21/2018   Procedure:  TRANSESOPHAGEAL ECHOCARDIOGRAM (TEE);  Surgeon: BJolaine Artist MD;  [facility];  Service: Cardiovascular;  Laterality: N/A;  . UPPER GASTROINTESTINAL ENDOSCOPY  07/12/2017   Patchy areas of mucosal inflammation noted in the antrum with edema,erthema and ulcerations. Bx. Chronicfocally active gastritis.  .Marland KitchenVASECTOMY      There were no vitals filed for this visit.  Subjective Assessment - 07/11/18 1506    Subjective  Notes continued biceps pain however improvement in shoulder strength and pain levels since last visit.      Pertinent History  a fib, a flutter, chronic anticoagulation, chronic back pain, COPD, CAD, dyspnea, HTN, aortic valve replacement 2018, CABG 2018, HLD, kidney stones, leukocytoclastic vasculitis, pleural effusion, syncope, wrist and forearm fx surgery    Diagnostic tests  R shoulder xray 05/17/18: No acute fracture or dislocation; Osteoarthritic change noted in the right glenohumeral joint. R shoulder MRI: 05/26: Full-thickness tear of the vast majority of  the supraspinatus tendon; Mild distal infraspinatus partial-thickness articular surface tearing. Mild subscapularis tendinopathy. Poor visualization of the intra-articular segment of the long head of the biceps compatible with at least partial tearing.    Currently in Pain?  Yes    Pain Score  5     Pain Location  Arm    Pain Orientation  Right    Pain Descriptors / Indicators  Constant;Sharp    Pain Type  Acute pain    Pain Onset  1 to 4 weeks ago    Pain Frequency  Constant    Aggravating Factors   Raising arm overhead     Pain Relieving Factors  Ice     Multiple Pain Sites  No                       [facility] Adult PT Treatment/Exercise - 07/11/18 1523      Shoulder Exercises: Supine   External Rotation  AAROM;Right;10 reps    External Rotation Limitations  wand    Flexion  Right;AAROM;10 reps    Flexion Limitations  wand; 2#       Shoulder Exercises: Standing   Flexion  Both;10  reps;AAROM Mirror feedback to prevent scap. elevation     Flexion Limitations  wand     ABduction  10 reps;AAROM mirror to prevent scap. elevation     ABduction Limitations  wand       Shoulder Exercises: Pulleys   Flexion  3 minutes Cues for 5" hold     Scaption  3 minutes Cues for 5" hold       Vasopneumatic   Number Minutes Vasopneumatic   10 minutes    Vasopnuematic Location   Shoulder    Vasopneumatic Pressure  Low    Vasopneumatic Temperature   coldest temp.       Manual Therapy   Manual Therapy  Soft tissue mobilization;Taping    Manual therapy comments  supine     Soft tissue mobilization  STM to R forearm extensor bundle/brachioradialis     Myofascial Release  TPR to proximal brachioradialis; pt. very ttp     Kinesiotex  IT sales professional  R shoulder impingement taping pattern; R forearm extensor taping pattern; for pain relief with daily activities and to create space                PT Short Term Goals - 06/17/18 1109      PT SHORT TERM GOAL #1   Title  Patient to be independent with inital HEP.    Time  4    Period  Weeks    Status  Achieved        PT Long Term Goals - 06/10/18 1414      PT LONG TERM GOAL #1   Title  Patient to be independent with advanced HEP.    Time  8    Period  Weeks    Status  On-going      PT LONG TERM GOAL #2   Title  Patient to demonstrate R shoulder AROM/PROM WFL without pain limiting.     Time  8    Period  Weeks    Status  On-going      PT LONG TERM GOAL #3   Title  Patient to demonstrate >=4+/5 R UE strength.    Time  8    Period  Weeks    Status  On-going      PT LONG TERM GOAL #4   Title  Patient to demonstrate lifting 15# box overhead with <=1/10 pain.    Time  8    Period  Weeks    Status  On-going            Plan - 07/11/18 1508    Clinical Impression Statement  Lavance seen to start session reporting improvement in pain and function at R shoulder since last visit.   Requested trial of K-taping today as daughter (who is a PT) taped his shoulder and he felt benefit from this.  Pt. presenting with severe ttp in R forearm extensor group with palpable TP, which responded well to TPR today.  Ended session with taping to R forearm and shoulder and pt. able to demo improved AROM today with fewer substitutions.  Will continue to progress toward goals.      PT Treatment/Interventions  ADLs/Self Care Home Management;Electrical Stimulation;Cryotherapy;Moist Heat;Therapeutic exercise;Therapeutic activities;Functional mobility training;Ultrasound;Patient/family education;Manual techniques;Vasopneumatic Device;Taping;Splinting;Energy conservation;Dry needling;Passive range of motion    Consulted and Agree with Plan of Care  Patient       Patient will benefit from skilled therapeutic intervention in order to improve the following deficits and impairments:  Hypomobility, Decreased activity tolerance, Decreased strength, Pain, Impaired UE functional use, Decreased range of motion, Impaired flexibility, Postural dysfunction  Visit Diagnosis: Acute pain of right shoulder  Stiffness of right shoulder, not elsewhere classified  Muscle weakness (generalized)  Other symptoms and signs involving the musculoskeletal system     Problem List Patient Active Problem List   Diagnosis Date Noted  . Hypotension, chronic 06/06/2018  . Enterococcal bacteremia   . Pulmonary edema   . Shoulder pain   . PAH (pulmonary artery hypertension) ([place]) 02/20/2018  . Hypokalemia 02/18/2018  . Anticoagulated on Coumadin 01/04/2018  . Localized edema 01/04/2018  . Leukocytoclastic vasculitis ([place]) 10/01/2017  . Palpitations 10/01/2017  . Maculopapular rash 09/03/2017  . Epidermoid cyst of skin 08/24/2017  . Pleural effusion, bilateral 08/09/2017  . Pulmonary hypertension ([place]) 08/09/2017  . Supratherapeutic INR 07/26/2017  . Asymptomatic microscopic hematuria 06/20/2017  . Prostate  cancer screening 06/20/2017  . Atrial flutter ([place]) 03/29/2017  . Chronic anticoagulation 03/29/2017  . Coronary artery disease involving native coronary artery of native heart with angina pectoris ([place]) 03/29/2017  . H/O maze procedure 03/29/2017  . [name] 03/29/2017  . Hx of CABG 03/29/2017  . Hypertensive heart disease with heart failure ([place]) 03/29/2017  . Non-sustained ventricular tachycardia ([place]) 03/29/2017  . Hyperlipidemia 03/29/2017  . Syncope 03/29/2017  . Cough 10/17/2016  . COPD (chronic obstructive pulmonary disease) ([place]) 06/12/2016  . Pulmonary nodules 06/12/2016  . Anxiety 05/10/2016  . 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 Chronic allergic rhinitis 04/28/2016  . History of prosthetic aortic valve 03/20/2016  . Post-nasal drainage 02/25/2016  . Dyspnea 02/01/2016  . Syncope and collapse 02/01/2016  . Typical atrial flutter ([place]) 02/01/2016  . Lumbar radicular pain 01/19/2016  . Chronic midline back pain 12/14/2015  . Essential hypertension 12/14/2015  . Chronic prostatitis 07/23/2015  . Nephrolithiasis 07/23/2015    Bess Harvest, PTA 07/11/18 6:34 PM    [facility] [address]  [place], [place], [address] Phone: [phone]   Fax:  [phone]  Name: Lance Hicks MRN: [id number] Date of Birth: 1946-07-02

## 2018-07-15 ENCOUNTER — Ambulatory Visit: Payer: Medicare Other

## 2018-07-15 DIAGNOSIS — M25511 Pain in right shoulder: Secondary | ICD-10-CM

## 2018-07-15 DIAGNOSIS — M25611 Stiffness of right shoulder, not elsewhere classified: Secondary | ICD-10-CM | POA: Diagnosis not present

## 2018-07-15 DIAGNOSIS — R29898 Other symptoms and signs involving the musculoskeletal system: Secondary | ICD-10-CM

## 2018-07-15 DIAGNOSIS — Z952 Presence of prosthetic heart valve: Secondary | ICD-10-CM | POA: Diagnosis not present

## 2018-07-15 DIAGNOSIS — M6281 Muscle weakness (generalized): Secondary | ICD-10-CM

## 2018-07-15 NOTE — Therapy (Signed)
[facility] [address]  [place], [place], [address] Phone: [phone]   Fax:  [phone]  Physical Therapy Treatment  Patient Details  Name: Lance Hicks MRN: [id number] Date of Birth: 04-07-1946 Referring Provider: Dr. Stann Mainland   Encounter Date: 07/15/2018  PT End of Session - 07/15/18 1323    Visit Number  8    Number of Visits  17    Date for PT Re-Evaluation  07/30/18    Authorization Type  Medicare [employer] Plan F    PT Start Time  1319    PT Stop Time  1400    PT Time Calculation (min)  41 min    Activity Tolerance  Patient tolerated treatment well    Behavior During Therapy  [facility] for tasks assessed/performed       Past Medical History:  Diagnosis Date  . 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 In light of concurrent left nephrolithiasis, will check CT renal colic next year instead of renal US.   [name] Anticoagulated on Coumadin 01/04/2018  . Anxiety 05/10/2016   Last Assessment & Plan:  Doing well off of zoloft.  . Asymptomatic microscopic hematuria 06/20/2017   Last Assessment & Plan:  Had hematuria workup in [facility] in 2016 which negative CT and cystoscopy. UA with 2+ blood last visit - we discussed recommendation for repeat workup at 5 years or if degree of hematuria progresses.   . Atrial fibrillation ([place]) 03/29/2017  . Atrial flutter ([place]) 03/29/2017  . Chronic allergic rhinitis 04/28/2016   Last Assessment & Plan:  Continue astelin  . Chronic anticoagulation 03/29/2017  . Chronic atrial fibrillation ([place]) 07/23/2015   Last Assessment & Plan:  Coumadin and metoprolol, cardiology referral to establish care.  . Chronic midline back pain 12/14/2015   Last Assessment & Plan:  Pain management referral for further evaluation.  . Chronic prostatitis 07/23/2015   Last Assessment & Plan:  Has largely resolved since stopping bike riding. Recommend annual DRE AND  PSA - will see back 12/2015 for annual screening, given 1st degree fhx. To call office for recurrent prostatitis symptoms.   [name] COPD (chronic obstructive pulmonary disease) ([place]) 06/12/2016  . Coronary artery disease involving native coronary artery of native heart with angina pectoris ([place]) 03/29/2017  . Cough 10/17/2016   Last Assessment & Plan:  Discussed typical course for acute viral illness. If symptoms worsen or fail to improve by 7-10d, delayed ATBs, fluids, rest, NSAIDs/APAP prn. Seek care if not improving. Needs earlier INR check due to ATBs.  [name] Dyspnea 02/01/2016   Last Assessment & Plan:  Overall improving, eval by pulm, plan for CT, neg stress test with cardiology. Recent switch to carvedilol due to side effects.  . Epidermoid cyst of skin 08/24/2017  . Essential hypertension 12/14/2015   Last Assessment & Plan:  Hypertension control: controlled  Medications: compliant Medication Management: as noted in orders Home blood pressure monitoring recommended additionally as needed for symptoms  The patient's care plan was reviewed and updated. Instructions and counseling were provided regarding patient goals and barriers. He was counseled to adopt a healthy lifestyle. Educational resources and self-management tools have been provided as charted in [facility] list.   . H/O maze procedure 03/29/2017  . [name] 03/29/2017   Overview:  2011  . Hx of CABG 03/29/2017  . Hyperlipidemia 03/29/2017  . Hypertensive heart disease 03/29/2017  .  Kidney stones 07/23/2015   Overview:  x 3  Last Assessment & Plan:  By [place] has left nephrolithiasis, but not visible by KUB. Will check CT renal colic next year to assess both stone burden as well as to surveil AML.   . Leukocytoclastic vasculitis ([place]) 10/01/2017  . Localized edema 01/04/2018  . Lumbar radicular pain 01/19/2016  . Maculopapular rash 09/03/2017  . Nephrolithiasis 07/23/2015   Overview:  x 3  Last Assessment & Plan:  By [place] has left  nephrolithiasis, but not visible by KUB. Will check CT renal colic next year to assess both stone burden as well as to surveil AML.   Overview:  x 3  Last Assessment & Plan:  Has 58m nonobstructing LUP stone - not visible by KUB.  Will check renal [place] - he will contact office sooner if symptomatic.   . Non-sustained ventricular tachycardia ([place] 03/29/2017  . Other hyperlipidemia 03/29/2017  . Palpitations 10/01/2017  . Paroxysmal atrial fibrillation ([place] 03/29/2017  . Paroxysmal atrial fibrillation ([place] 03/29/2017  . Pleural effusion, bilateral 08/09/2017  . Post-nasal drainage 02/25/2016   Last Assessment & Plan:  Trial zyrtec and flonase  . Prostate cancer screening 06/20/2017   Last Assessment & Plan:  Recommend continued annual CaP screening until within 10 years of life expectancy. Given good health and fhx of longevity, would anticipate CaP screening to continue until age 72  PSA today and again in one year on day of visit.  . Pulmonary hypertension ([place] 08/09/2017  . Pulmonary nodules 06/12/2016  . S/P AVR (aortic valve replacement) 03/20/2016  . Supratherapeutic INR 07/26/2017  . Syncope 03/29/2017  . Syncope and collapse 02/01/2016  . Typical atrial flutter ([place] 02/01/2016    Past Surgical History:  Procedure Laterality Date  . CHOLECYSTECTOMY    . CORONARY ARTERY BYPASS GRAFT    . EXPLORATORY LAPAROTOMY  07/30/2017  . FOOT SURGERY    . FRACTURE SURGERY Right    wrist and forearm  . HERNIA REPAIR    . MECHANICAL AORTIC VALVE REPLACEMENT    . NASAL SINUS SURGERY    . RIGHT HEART CATH N/A 01/18/2018   Procedure: RIGHT HEART CATH;  Surgeon: BJolaine Artist MD;  [facility];  Service: Cardiovascular;  Laterality: N/A;  . RIGHT HEART CATH N/A 05/14/2018   Procedure: RIGHT HEART CATH;  Surgeon: BJolaine Artist MD;  [facility];  Service: Cardiovascular;  Laterality: N/A;  . TEE WITHOUT CARDIOVERSION N/A 05/21/2018   Procedure: TRANSESOPHAGEAL  ECHOCARDIOGRAM (TEE);  Surgeon: BJolaine Artist MD;  [facility];  Service: Cardiovascular;  Laterality: N/A;  . UPPER GASTROINTESTINAL ENDOSCOPY  07/12/2017   Patchy areas of mucosal inflammation noted in the antrum with edema,erthema and ulcerations. Bx. Chronicfocally active gastritis.  .Marland KitchenVASECTOMY      There were no vitals filed for this visit.  Subjective Assessment - 07/15/18 1321    Subjective  Pt. reporting improvement in R shoulder motion and strength.      Pertinent History  a fib, a flutter, chronic anticoagulation, chronic back pain, COPD, CAD, dyspnea, HTN, aortic valve replacement 2018, CABG 2018, HLD, kidney stones, leukocytoclastic vasculitis, pleural effusion, syncope, wrist and forearm fx surgery    Diagnostic tests  R shoulder xray 05/17/18: No acute fracture or dislocation; Osteoarthritic change noted in the right glenohumeral joint. R shoulder MRI: 05/26: Full-thickness tear of the vast majority of the supraspinatus tendon; Mild distal infraspinatus partial-thickness articular surface tearing. Mild  subscapularis tendinopathy. Poor visualization of the intra-articular segment of the long head of the biceps compatible with at least partial tearing.    Currently in Pain?  Yes    Pain Score  1     Pain Location  Arm    Pain Orientation  Distal;Right    Pain Descriptors / Indicators  Constant;Sharp    Pain Type  Acute pain    Pain Frequency  Constant    Aggravating Factors   Raising arm overhead     Pain Relieving Factors  ice     Multiple Pain Sites  No                       [facility] Adult PT Treatment/Exercise - 07/15/18 1327      Shoulder Exercises: Standing   External Rotation  Right;Theraband;15 reps    Theraband Level (Shoulder External Rotation)  Level 1 (Yellow)    Internal Rotation  Right;Theraband;15 reps    Theraband Level (Shoulder Internal Rotation)  Level 1 (Yellow)    Extension  Both;Strengthening;15 reps;Theraband     Theraband Level (Shoulder Extension)  Level 2 (Red)    Row  Both;Strengthening;15 reps;Theraband    Theraband Level (Shoulder Row)  Level 2 (Red)    Other Standing Exercises  R biceps curl with red TB (tumb down, thumb up) 2 x 15 reps     Other Standing Exercises  R triceps extension red TB x 15      Shoulder Exercises: Pulleys   Flexion  3 minutes    Scaption  3 minutes      Shoulder Exercises: ROM/Strengthening   Cybex Row  15 reps 5" hold     Cybex Row Limitations  15#    Wall Pushups  10 reps    Wall Pushups Limitations  leaning on green p-ball on wall       Shoulder Exercises: Stretch   Corner Stretch  2 reps;30 seconds    Corner Stretch Limitations  on doorway chest stretch low       Manual Therapy   Manual Therapy  Soft tissue mobilization;Passive ROM    Manual therapy comments  supine     Soft tissue mobilization  STM to R forearm extensor bundle/brachioradialis     Myofascial Release  TPR to proximal brachioradialis; pt. very ttp                PT Short Term Goals - 06/17/18 1109      PT SHORT TERM GOAL #1   Title  Patient to be independent with inital HEP.    Time  4    Period  Weeks    Status  Achieved        PT Long Term Goals - 06/10/18 1414      PT LONG TERM GOAL #1   Title  Patient to be independent with advanced HEP.    Time  8    Period  Weeks    Status  On-going      PT LONG TERM GOAL #2   Title  Patient to demonstrate R shoulder AROM/PROM WFL without pain limiting.     Time  8    Period  Weeks    Status  On-going      PT LONG TERM GOAL #3   Title  Patient to demonstrate >=4+/5 R UE strength.    Time  8    Period  Weeks    Status  On-going  PT LONG TERM GOAL #4   Title  Patient to demonstrate lifting 15# box overhead with <=1/10 pain.    Time  8    Period  Weeks    Status  On-going            Plan - 07/15/18 1325    Clinical Impression Statement  Levar able to demo improved ROM and strength at R shoulder today  tolerating progression of strengthening activities with decreased substitutions.  Does require cueing for pacing and to prevent excessive scap. elevation with elevation type activities.  Will continue to progress toward goals.      PT Treatment/Interventions  ADLs/Self Care Home Management;Electrical Stimulation;Cryotherapy;Moist Heat;Therapeutic exercise;Therapeutic activities;Functional mobility training;Ultrasound;Patient/family education;Manual techniques;Vasopneumatic Device;Taping;Splinting;Energy conservation;Dry needling;Passive range of motion    Consulted and Agree with Plan of Care  Patient       Patient will benefit from skilled therapeutic intervention in order to improve the following deficits and impairments:  Hypomobility, Decreased activity tolerance, Decreased strength, Pain, Impaired UE functional use, Decreased range of motion, Impaired flexibility, Postural dysfunction  Visit Diagnosis: Acute pain of right shoulder  Stiffness of right shoulder, not elsewhere classified  Muscle weakness (generalized)  Other symptoms and signs involving the musculoskeletal system     Problem List Patient Active Problem List   Diagnosis Date Noted  . Hypotension, chronic 06/06/2018  . Enterococcal bacteremia   . Pulmonary edema   . Shoulder pain   . PAH (pulmonary artery hypertension) ([place]) 02/20/2018  . Hypokalemia 02/18/2018  . Anticoagulated on Coumadin 01/04/2018  . Localized edema 01/04/2018  . Leukocytoclastic vasculitis ([place]) 10/01/2017  . Palpitations 10/01/2017  . Maculopapular rash 09/03/2017  . Epidermoid cyst of skin 08/24/2017  . Pleural effusion, bilateral 08/09/2017  . Pulmonary hypertension ([place]) 08/09/2017  . Supratherapeutic INR 07/26/2017  . Asymptomatic microscopic hematuria 06/20/2017  . Prostate cancer screening 06/20/2017  . Atrial flutter ([place]) 03/29/2017  . Chronic anticoagulation 03/29/2017  . Coronary artery disease involving native coronary  artery of native heart with angina pectoris ([place]) 03/29/2017  . H/O maze procedure 03/29/2017  . [name] 03/29/2017  . Hx of CABG 03/29/2017  . Hypertensive heart disease with heart failure ([place]) 03/29/2017  . Non-sustained ventricular tachycardia ([place]) 03/29/2017  . Hyperlipidemia 03/29/2017  . Syncope 03/29/2017  . Cough 10/17/2016  . COPD (chronic obstructive pulmonary disease) ([place]) 06/12/2016  . Pulmonary nodules 06/12/2016  . Anxiety 05/10/2016  . 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 Chronic allergic rhinitis 04/28/2016  . History of prosthetic aortic valve 03/20/2016  . Post-nasal drainage 02/25/2016  . Dyspnea 02/01/2016  . Syncope and collapse 02/01/2016  . Typical atrial flutter ([place]) 02/01/2016  . Lumbar radicular pain 01/19/2016  . Chronic midline back pain 12/14/2015  . Essential hypertension 12/14/2015  . Chronic prostatitis 07/23/2015  . Nephrolithiasis 07/23/2015    Bess Harvest, PTA 07/15/18 6:40 PM    [facility] [address]  [place], [place], [address] Phone: [phone]   Fax:  [phone]  Name: Lance Hicks MRN: [id number] Date of Birth: 1946/10/28

## 2018-07-17 ENCOUNTER — Encounter: Payer: Self-pay | Admitting: Physical Therapy

## 2018-07-17 ENCOUNTER — Ambulatory Visit: Payer: Medicare Other | Admitting: Physical Therapy

## 2018-07-17 DIAGNOSIS — R29898 Other symptoms and signs involving the musculoskeletal system: Secondary | ICD-10-CM | POA: Diagnosis not present

## 2018-07-17 DIAGNOSIS — M6281 Muscle weakness (generalized): Secondary | ICD-10-CM | POA: Diagnosis not present

## 2018-07-17 DIAGNOSIS — M25611 Stiffness of right shoulder, not elsewhere classified: Secondary | ICD-10-CM | POA: Diagnosis not present

## 2018-07-17 DIAGNOSIS — M25511 Pain in right shoulder: Secondary | ICD-10-CM

## 2018-07-17 NOTE — Therapy (Signed)
[facility] [address]  [place], [place], [address] Phone: [phone]   Fax:  [phone]  Physical Therapy Progress Note  Patient Details  Name: Lance Hicks MRN: [id number] Date of Birth: 1946-09-01 Referring Provider: Dr. Stann Mainland   Progress Note Reporting Period 06/04/18 to 07/17/18  See note below for Objective Data and Assessment of Progress/Goals.    Encounter Date: 07/17/2018  PT End of Session - 07/17/18 1407    Visit Number  9    Number of Visits  17    Date for PT Re-Evaluation  07/30/18    Authorization Type  Medicare [employer] Plan F    PT Start Time  1318    PT Stop Time  1408    PT Time Calculation (min)  50 min    Activity Tolerance  Patient tolerated treatment well    Behavior During Therapy  WFL for tasks assessed/performed       Past Medical History:  Diagnosis Date  . 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 In light of concurrent left nephrolithiasis, will check CT renal colic next year instead of renal US.   [name] Anticoagulated on Coumadin 01/04/2018  . Anxiety 05/10/2016   Last Assessment & Plan:  Doing well off of zoloft.  . Asymptomatic microscopic hematuria 06/20/2017   Last Assessment & Plan:  Had hematuria workup in [facility] in 2016 which negative CT and cystoscopy. UA with 2+ blood last visit - we discussed recommendation for repeat workup at 5 years or if degree of hematuria progresses.   . Atrial fibrillation ([place]) 03/29/2017  . Atrial flutter ([place]) 03/29/2017  . Chronic allergic rhinitis 04/28/2016   Last Assessment & Plan:  Continue astelin  . Chronic anticoagulation 03/29/2017  . Chronic atrial fibrillation ([place]) 07/23/2015   Last Assessment & Plan:  Coumadin and metoprolol, cardiology referral to establish care.  . Chronic midline back pain 12/14/2015   Last Assessment & Plan:  Pain management referral for further evaluation.  .  Chronic prostatitis 07/23/2015   Last Assessment & Plan:  Has largely resolved since stopping bike riding. Recommend annual DRE AND PSA - will see back 12/2015 for annual screening, given 1st degree fhx. To call office for recurrent prostatitis symptoms.   [name] COPD (chronic obstructive pulmonary disease) ([facility]) 06/12/2016  . Coronary artery disease involving native coronary artery of native heart with angina pectoris ([place]) 03/29/2017  . Cough 10/17/2016   Last Assessment & Plan:  Discussed typical course for acute viral illness. If symptoms worsen or fail to improve by 7-10d, delayed ATBs, fluids, rest, NSAIDs/APAP prn. Seek care if not improving. Needs earlier INR check due to ATBs.  [name] Dyspnea 02/01/2016   Last Assessment & Plan:  Overall improving, eval by pulm, plan for CT, neg stress test with cardiology. Recent switch to carvedilol due to side effects.  . Epidermoid cyst of skin 08/24/2017  . Essential hypertension 12/14/2015   Last Assessment & Plan:  Hypertension control: controlled  Medications: compliant Medication Management: as noted in orders Home blood pressure monitoring recommended additionally as needed for symptoms  The patient's care plan was reviewed and updated. Instructions and counseling were provided regarding patient goals and barriers. He was counseled to adopt a healthy lifestyle. Educational resources and self-management tools have been provided as charted in [facility] list.   . H/O maze procedure 03/29/2017  . [name]  03/29/2017   Overview:  2011  . Hx of CABG 03/29/2017  . Hyperlipidemia 03/29/2017  . Hypertensive heart disease 03/29/2017  . Kidney stones 07/23/2015   Overview:  x 3  Last Assessment & Plan:  By [place] has left nephrolithiasis, but not visible by KUB. Will check CT renal colic next year to assess both stone burden as well as to surveil AML.   . Leukocytoclastic vasculitis ([place]) 10/01/2017  . Localized edema 01/04/2018  . Lumbar radicular pain 01/19/2016   . Maculopapular rash 09/03/2017  . Nephrolithiasis 07/23/2015   Overview:  x 3  Last Assessment & Plan:  By [place] has left nephrolithiasis, but not visible by KUB. Will check CT renal colic next year to assess both stone burden as well as to surveil AML.   Overview:  x 3  Last Assessment & Plan:  Has 28m nonobstructing LUP stone - not visible by KUB.  Will check renal [place] - he will contact office sooner if symptomatic.   . Non-sustained ventricular tachycardia ([place] 03/29/2017  . Other hyperlipidemia 03/29/2017  . Palpitations 10/01/2017  . Paroxysmal atrial fibrillation ([place] 03/29/2017  . Paroxysmal atrial fibrillation ([place] 03/29/2017  . Pleural effusion, bilateral 08/09/2017  . Post-nasal drainage 02/25/2016   Last Assessment & Plan:  Trial zyrtec and flonase  . Prostate cancer screening 06/20/2017   Last Assessment & Plan:  Recommend continued annual CaP screening until within 10 years of life expectancy. Given good health and fhx of longevity, would anticipate CaP screening to continue until age 72  PSA today and again in one year on day of visit.  . Pulmonary hypertension ([place] 08/09/2017  . Pulmonary nodules 06/12/2016  . S/P AVR (aortic valve replacement) 03/20/2016  . Supratherapeutic INR 07/26/2017  . Syncope 03/29/2017  . Syncope and collapse 02/01/2016  . Typical atrial flutter ([place] 02/01/2016    Past Surgical History:  Procedure Laterality Date  . CHOLECYSTECTOMY    . CORONARY ARTERY BYPASS GRAFT    . EXPLORATORY LAPAROTOMY  07/30/2017  . FOOT SURGERY    . FRACTURE SURGERY Right    wrist and forearm  . HERNIA REPAIR    . MECHANICAL AORTIC VALVE REPLACEMENT    . NASAL SINUS SURGERY    . RIGHT HEART CATH N/A 01/18/2018   Procedure: RIGHT HEART CATH;  Surgeon: BJolaine Artist MD;  [facility];  Service: Cardiovascular;  Laterality: N/A;  . RIGHT HEART CATH N/A 05/14/2018   Procedure: RIGHT HEART CATH;  Surgeon: BJolaine Artist MD;  [facility];   Service: Cardiovascular;  Laterality: N/A;  . TEE WITHOUT CARDIOVERSION N/A 05/21/2018   Procedure: TRANSESOPHAGEAL ECHOCARDIOGRAM (TEE);  Surgeon: BJolaine Artist MD;  [facility];  Service: Cardiovascular;  Laterality: N/A;  . UPPER GASTROINTESTINAL ENDOSCOPY  07/12/2017   Patchy areas of mucosal inflammation noted in the antrum with edema,erthema and ulcerations. Bx. Chronicfocally active gastritis.  .Marland KitchenVASECTOMY      There were no vitals filed for this visit.  Subjective Assessment - 07/17/18 1320    Subjective  Reports still having bicep pain but shoulder pain is much better. Reports having shoulder taped helped a lot. Reports 75% improvement since initial eval.    Pertinent History  a fib, a flutter, chronic anticoagulation, chronic back pain, COPD, CAD, dyspnea, HTN, aortic valve replacement 2018, CABG 2018, HLD, kidney stones, leukocytoclastic vasculitis, pleural effusion, syncope, wrist and forearm fx surgery    Diagnostic tests  R shoulder  xray 05/17/18: No acute fracture or dislocation; Osteoarthritic change noted in the right glenohumeral joint. R shoulder MRI: 05/26: Full-thickness tear of the vast majority of the supraspinatus tendon; Mild distal infraspinatus partial-thickness articular surface tearing. Mild subscapularis tendinopathy. Poor visualization of the intra-articular segment of the long head of the biceps compatible with at least partial tearing.    Currently in Pain?  No/denies         [facility] PT Assessment - 07/17/18 0001      Observation/Other Assessments   Focus on Therapeutic Outcomes (FOTO)   Shoulder: 72(28% limited, 22% predicted)      AROM   Right/Left Shoulder  Right    Right Shoulder Flexion  118 Degrees    Right Shoulder ABduction  103 Degrees    Right Shoulder Internal Rotation  55 Degrees    Right Shoulder External Rotation  60 Degrees      PROM   Right Shoulder Flexion  145 Degrees    Right Shoulder ABduction  110 Degrees    Right  Shoulder Internal Rotation  60 Degrees    Right Shoulder External Rotation  65 Degrees      Strength   Right/Left Shoulder  Right    Right Shoulder Flexion  4/5    Right Shoulder ABduction  4/5    Right Shoulder Internal Rotation  4/5    Right Shoulder External Rotation  4-/5                   [facility] Adult PT Treatment/Exercise - 07/17/18 0001      Shoulder Exercises: Prone   Other Prone Exercises  prone row R UE; 2x10 with 3# report of popping in thoracic spine, denies pain      Shoulder Exercises: Sidelying   External Rotation  Right;10 reps    External Rotation Limitations  10x 0#, 10x 1#; dowel under elbow    ABduction  Right;10 reps;Limitations    ABduction Weight (lbs)  thumb up      Shoulder Exercises: Standing   External Rotation  Right;Theraband;15 reps;Limitations    Theraband Level (Shoulder External Rotation)  Level 1 (Yellow)    External Rotation Limitations  dowel under elbow; VCs for form    Internal Rotation  Right;Theraband;15 reps;Limitations    Theraband Level (Shoulder Internal Rotation)  Level 1 (Yellow)    Internal Rotation Limitations  dowel under elbow; VCs for form      Shoulder Exercises: Pulleys   Flexion  3 minutes    Scaption  3 minutes      Manual Therapy   Manual Therapy  Soft tissue mobilization;Passive ROM;Taping    Manual therapy comments  supine     Soft tissue mobilization  STM to R brachioradialis and biceps insertion- extremely TTP    Passive ROM  R shoulder PROM in all planes to tolerance- most pain in end range ER and ABD    Kinesiotex  Create Space      Kinesiotix   Create Space  R shoulder impingement taping pattern; R forearm extensor taping pattern; for pain relief with daily activities and to create space                PT Short Term Goals - 07/17/18 1324      PT SHORT TERM GOAL #1   Title  Patient to be independent with inital HEP.    Time  4    Period  Weeks    Status  Achieved  PT Long  Term Goals - 07/17/18 1324      PT LONG TERM GOAL #1   Title  Patient to be independent with advanced HEP.    Time  8    Period  Weeks    Status  Partially Met "pretty much consistent"      PT LONG TERM GOAL #2   Title  Patient to demonstrate R shoulder AROM/PROM [facility] without pain limiting.     Time  8    Period  Weeks    Status  On-going improvements noted in R shoulder flexion, IR, ER PROM and flexion and abduction AROM      PT LONG TERM GOAL #3   Title  Patient to demonstrate >=4+/5 R UE strength.    Time  8    Period  Weeks    Status  On-going improvements noted in flexion, IR, ABD strength      PT LONG TERM GOAL #4   Title  Patient to demonstrate lifting 15# box overhead with <=1/10 pain.    Time  8    Period  Weeks    Status  On-going able to lift 5# object with difficulty but no pain            Plan - 07/17/18 1408    Clinical Impression Statement  Patient arrived to session with no new complaints, still with persisting R brachioradialis/biceps pain. Reports 75% improvement since initial eval. Better able to perform reaching forward and overhead, still having trouble with heavy lifting. Updated goals this date- patient has shown improvements in R shoulder flexion, IR, ER PROM and flexion and abduction AROM, improvements noted in flexion, IR, ABD strength, and able to reach overhead with 5# weight and no pain- albeit with difficulty and muscle shaking. Tolerated PROM to R shoulder- patient with most pain and limitation at end range ER and ABD. Attempted STM to R brachioradialis/biceps- patient extremely TTP and deferred STM until more tolerable to patient. However, patient without pain with resisted elbow flexion/extension or wrist flexion/extension. Able to perform sidelying and standing RTC strengthening with weight and banded resistance; intermittent VC/TCs required to correct form. Patient noted benefit from kinesiotaping- applied kinesiotape to R shoulder and forearm for  continued pain relief. Reminded patient not to wear tape for longer than 3 days and remove with soap and water if irritation occurs, especially considering patient's current bruising on B UEs d/t being on blood thinners. Patient reported understanding. No pain at end of session. Patient will continue to benefit from skilled PT services to address remaining goals and address pain and functional deficits. Patient in agreement.     PT Treatment/Interventions  ADLs/Self Care Home Management;Electrical Stimulation;Cryotherapy;Moist Heat;Therapeutic exercise;Therapeutic activities;Functional mobility training;Ultrasound;Patient/family education;Manual techniques;Vasopneumatic Device;Taping;Splinting;Energy conservation;Dry needling;Passive range of motion    PT Next Visit Plan  reassess benefit from tape; progress with banded resistance    Consulted and Agree with Plan of Care  Patient       Patient will benefit from skilled therapeutic intervention in order to improve the following deficits and impairments:  Hypomobility, Decreased activity tolerance, Decreased strength, Pain, Impaired UE functional use, Decreased range of motion, Impaired flexibility, Postural dysfunction  Visit Diagnosis: Acute pain of right shoulder  Stiffness of right shoulder, not elsewhere classified  Muscle weakness (generalized)  Other symptoms and signs involving the musculoskeletal system     Problem List Patient Active Problem List   Diagnosis Date Noted  . Hypotension, chronic 06/06/2018  . Enterococcal bacteremia   .  Pulmonary edema   . Shoulder pain   . PAH (pulmonary artery hypertension) ([place]) 02/20/2018  . Hypokalemia 02/18/2018  . Anticoagulated on Coumadin 01/04/2018  . Localized edema 01/04/2018  . Leukocytoclastic vasculitis ([place]) 10/01/2017  . Palpitations 10/01/2017  . Maculopapular rash 09/03/2017  . Epidermoid cyst of skin 08/24/2017  . Pleural effusion, bilateral 08/09/2017  . Pulmonary  hypertension ([place]) 08/09/2017  . Supratherapeutic INR 07/26/2017  . Asymptomatic microscopic hematuria 06/20/2017  . Prostate cancer screening 06/20/2017  . Atrial flutter ([place]) 03/29/2017  . Chronic anticoagulation 03/29/2017  . Coronary artery disease involving native coronary artery of native heart with angina pectoris ([place]) 03/29/2017  . H/O maze procedure 03/29/2017  . [name] 03/29/2017  . Hx of CABG 03/29/2017  . Hypertensive heart disease with heart failure ([place]) 03/29/2017  . Non-sustained ventricular tachycardia ([place]) 03/29/2017  . Hyperlipidemia 03/29/2017  . Syncope 03/29/2017  . Cough 10/17/2016  . COPD (chronic obstructive pulmonary disease) ([place]) 06/12/2016  . Pulmonary nodules 06/12/2016  . Anxiety 05/10/2016  . 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 Chronic allergic rhinitis 04/28/2016  . History of prosthetic aortic valve 03/20/2016  . Post-nasal drainage 02/25/2016  . Dyspnea 02/01/2016  . Syncope and collapse 02/01/2016  . Typical atrial flutter ([place]) 02/01/2016  . Lumbar radicular pain 01/19/2016  . Chronic midline back pain 12/14/2015  . Essential hypertension 12/14/2015  . Chronic prostatitis 07/23/2015  . Nephrolithiasis 07/23/2015    Janene Harvey, PT, DPT 07/17/18 2:23 PM   [facility] [address]  [place], [place], [address] Phone: [phone]   Fax:  [phone]  Name: Lance Hicks MRN: [id number] Date of Birth: 02-11-1946

## 2018-07-19 DIAGNOSIS — Z952 Presence of prosthetic heart valve: Secondary | ICD-10-CM | POA: Diagnosis not present

## 2018-07-22 ENCOUNTER — Ambulatory Visit: Payer: Medicare Other

## 2018-07-22 DIAGNOSIS — M6281 Muscle weakness (generalized): Secondary | ICD-10-CM

## 2018-07-22 DIAGNOSIS — M25511 Pain in right shoulder: Secondary | ICD-10-CM

## 2018-07-22 DIAGNOSIS — R29898 Other symptoms and signs involving the musculoskeletal system: Secondary | ICD-10-CM

## 2018-07-22 DIAGNOSIS — M25611 Stiffness of right shoulder, not elsewhere classified: Secondary | ICD-10-CM | POA: Diagnosis not present

## 2018-07-22 NOTE — Progress Notes (Signed)
Cardiology Office Note:    Date:  07/23/2018   ID:  Lance Hicks, DOB 11/25/1946, MRN [id number]  PCP:  Townsend Roger, MD  Cardiologist:  Shirlee More, MD    Referring MD: Townsend Roger, MD    ASSESSMENT:    1. Hypotension, chronic   2. PAH (pulmonary artery hypertension) ([place])   3. Atrial flutter, unspecified type ([place])   4. Hypertensive heart disease with heart failure ([place])   5. Chronic anticoagulation    PLAN:    In order of problems listed above:  1. Asymptomatic resolved continue his midodrine 2. Stable presently asymptomatic no findings of right heart failure poorly tolerant of treatment and I would take a conservative watchful course at this time.  Consider noninvasive echo or invasive hemodynamic reassessment in 6 months 3. Stable rate controlled continue his anticoagulant INR goal 2.5-3 with atrial arrhythmia and mechanical AVR 4. Stable heart failure he takes a minimum dose of loop diuretic and is well compensated 5. Stable at his request we will send warfarin management my practice   Next appointment: 3 months   Medication Adjustments/Labs and Tests Ordered: Current medicines are reviewed at length with the patient today.  Concerns regarding medicines are outlined above.  No orders of the defined types were placed in this encounter.  No orders of the defined types were placed in this encounter.  Permanent atrial fibrillation Chief Complaint  Patient presents with  . Follow-up    History of Present Illness:    Lance Hicks is a 72 y.o. male with a hx of permanent atrial fibrillation coronary artery disease aortic stenosis CABG aortic valve replacement #25 carbo metrics valve 2010 Maze procedure and left atrial appendage excision and pulmonary artery hypertension.  He has also has had a significant decline in GFR with CKD and symptomatic hypotension last seen 06/06/18. Compliance with diet, lifestyle and medications: Yes  Overall he is  remarkably improved he no longer is lightheaded or hypotensive he exercises at the gym is not wearing oxygen and takes a minimum dose of a loop diuretic 1 tablet of torsemide daily and has no edema he is quite pleased with the quality of his life and is no longer short of breath. Past Medical History:  Diagnosis Date  . 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 In light of concurrent left nephrolithiasis, will check CT renal colic next year instead of renal US.   Lance Hicks Anticoagulated on Coumadin 01/04/2018  . Anxiety 05/10/2016   Last Assessment & Plan:  Doing well off of zoloft.  . Asymptomatic microscopic hematuria 06/20/2017   Last Assessment & Plan:  Had hematuria workup in [facility] in 2016 which negative CT and cystoscopy. UA with 2+ blood last visit - we discussed recommendation for repeat workup at 5 years or if degree of hematuria progresses.   . Atrial fibrillation ([place]) 03/29/2017  . Atrial flutter ([place]) 03/29/2017  . Chronic allergic rhinitis 04/28/2016   Last Assessment & Plan:  Continue astelin  . Chronic anticoagulation 03/29/2017  . Chronic atrial fibrillation ([place]) 07/23/2015   Last Assessment & Plan:  Coumadin and metoprolol, cardiology referral to establish care.  . Chronic midline back pain 12/14/2015   Last Assessment & Plan:  Pain management referral for further evaluation.  . Chronic prostatitis 07/23/2015   Last Assessment & Plan:  Has largely resolved since stopping bike riding. Recommend annual DRE AND PSA - will see back  12/2015 for annual screening, given 1st degree fhx. To call office for recurrent prostatitis symptoms.   Lance Hicks COPD (chronic obstructive pulmonary disease) ([place]) 06/12/2016  . Coronary artery disease involving native coronary artery of native heart with angina pectoris ([place]) 03/29/2017  . Cough 10/17/2016   Last Assessment & Plan:  Discussed typical course for acute viral illness. If symptoms worsen or fail to improve by  7-10d, delayed ATBs, fluids, rest, NSAIDs/APAP prn. Seek care if not improving. Needs earlier INR check due to ATBs.  Lance Hicks Dyspnea 02/01/2016   Last Assessment & Plan:  Overall improving, eval by pulm, plan for CT, neg stress test with cardiology. Recent switch to carvedilol due to side effects.  . Epidermoid cyst of skin 08/24/2017  . Essential hypertension 12/14/2015   Last Assessment & Plan:  Hypertension control: controlled  Medications: compliant Medication Management: as noted in orders Home blood pressure monitoring recommended additionally as needed for symptoms  The patient's care plan was reviewed and updated. Instructions and counseling were provided regarding patient goals and barriers. He was counseled to adopt a healthy lifestyle. Educational resources and self-management tools have been provided as charted in [facility] list.   . H/O maze procedure 03/29/2017  . [name] 03/29/2017   Overview:  2011  . Hx of CABG 03/29/2017  . Hyperlipidemia 03/29/2017  . Hypertensive heart disease 03/29/2017  . Kidney stones 07/23/2015   Overview:  x 3  Last Assessment & Plan:  By [place] has left nephrolithiasis, but not visible by KUB. Will check CT renal colic next year to assess both stone burden as well as to surveil AML.   . Leukocytoclastic vasculitis ([place]) 10/01/2017  . Localized edema 01/04/2018  . Lumbar radicular pain 01/19/2016  . Maculopapular rash 09/03/2017  . Nephrolithiasis 07/23/2015   Overview:  x 3  Last Assessment & Plan:  By [place] has left nephrolithiasis, but not visible by KUB. Will check CT renal colic next year to assess both stone burden as well as to surveil AML.   Overview:  x 3  Last Assessment & Plan:  Has 87m nonobstructing LUP stone - not visible by KUB.  Will check renal [place] - he will contact office sooner if symptomatic.   . Non-sustained ventricular tachycardia ([place] 03/29/2017  . Other hyperlipidemia 03/29/2017  . Palpitations 10/01/2017  . Paroxysmal atrial  fibrillation ([place] 03/29/2017  . Paroxysmal atrial fibrillation ([place] 03/29/2017  . Pleural effusion, bilateral 08/09/2017  . Post-nasal drainage 02/25/2016   Last Assessment & Plan:  Trial zyrtec and flonase  . Prostate cancer screening 06/20/2017   Last Assessment & Plan:  Recommend continued annual CaP screening until within 10 years of life expectancy. Given good health and fhx of longevity, would anticipate CaP screening to continue until age 72  PSA today and again in one year on day of visit.  . Pulmonary hypertension ([place] 08/09/2017  . Pulmonary nodules 06/12/2016  . S/P AVR (aortic valve replacement) 03/20/2016  . Supratherapeutic INR 07/26/2017  . Syncope 03/29/2017  . Syncope and collapse 02/01/2016  . Typical atrial flutter ([place] 02/01/2016    Past Surgical History:  Procedure Laterality Date  . CHOLECYSTECTOMY    . CORONARY ARTERY BYPASS GRAFT    . EXPLORATORY LAPAROTOMY  07/30/2017  . FOOT SURGERY    . FRACTURE SURGERY Right    wrist and forearm  . HERNIA REPAIR    . MECHANICAL AORTIC VALVE REPLACEMENT    . NASAL SINUS SURGERY    .  RIGHT HEART CATH N/A 01/18/2018   Procedure: RIGHT HEART CATH;  Surgeon: Jolaine Artist, MD;  Location: [place] [facility];  Service: Cardiovascular;  Laterality: N/A;  . RIGHT HEART CATH N/A 05/14/2018   Procedure: RIGHT HEART CATH;  Surgeon: Jolaine Artist, MD;  Location: [place] [facility];  Service: Cardiovascular;  Laterality: N/A;  . TEE WITHOUT CARDIOVERSION N/A 05/21/2018   Procedure: TRANSESOPHAGEAL ECHOCARDIOGRAM (TEE);  Surgeon: Jolaine Artist, MD;  Location: [facility] ENDOSCOPY;  Service: Cardiovascular;  Laterality: N/A;  . UPPER GASTROINTESTINAL ENDOSCOPY  07/12/2017   Patchy areas of mucosal inflammation noted in the antrum with edema,erthema and ulcerations. Bx. Chronicfocally active gastritis.  Lance Hicks VASECTOMY      Current Medications: Current Meds  Medication Sig  . acetaminophen (TYLENOL) 650 MG CR tablet Take 650 mg by mouth every  8 (eight) hours as needed for pain.  Lance Hicks aspirin EC 81 MG tablet Take 81 mg by mouth daily.  Lance Hicks atorvastatin (LIPITOR) 40 MG tablet Take 1 tablet (40 mg total) by mouth daily at 6 PM.  . B Complex-C (B-COMPLEX WITH VITAMIN C) tablet Take 1 tablet by mouth daily.   . cetirizine (ZYRTEC ALLERGY) 10 MG tablet Take 10 mg by mouth daily as needed for allergies.   . Coenzyme Q10-Vitamin E (QUNOL ULTRA COQ10) 100-150 MG-UNIT CAPS Take 1 capsule by mouth every other day.  . Liniments (SALONPAS EX) Apply 1 patch topically daily as needed (pain).   . Macitentan (OPSUMIT) 10 MG TABS Take 1 tablet (10 mg total) by mouth daily.  . midodrine (PROAMATINE) 10 MG tablet Take 1 tablet (10 mg total) by mouth 3 (three) times daily with meals.  . multivitamin-lutein (OCUVITE-LUTEIN) CAPS capsule Take 1 capsule by mouth daily.  . pantoprazole (PROTONIX) 40 MG tablet Take 1 tablet (40 mg total) by mouth daily.  . potassium chloride SA (K-DUR,KLOR-CON) 20 MEQ tablet Take 2 tablets (40 mEq total) by mouth daily.  Lance Hicks spironolactone (ALDACTONE) 25 MG tablet Take 0.5 tablets (12.5 mg total) by mouth daily.  . tadalafil, PAH, (ADCIRCA) 20 MG tablet Take 2 tablets (40 mg total) by mouth daily.  Lance Hicks torsemide (DEMADEX) 20 MG tablet Take 3 tablets (60 mg total) by mouth daily. (Patient taking differently: Take 20 mg by mouth daily. )  . warfarin (COUMADIN) 2.5 MG tablet Take 2.5-5 mg by mouth See admin instructions. Take 2.67m on all days except Saturday, take 544m     Allergies:   Amoxicillin-pot clavulanate and Tape   Social History   Socioeconomic History  . Marital status: Single    Spouse name: Not on file  . Number of children: Not on file  . Years of education: Not on file  . Highest education level: Not on file  Occupational History  . Not on file  Social Needs  . Financial resource strain: Not on file  . Food insecurity:    Worry: Not on file    Inability: Not on file  . Transportation needs:    Medical: Not  on file    Non-medical: Not on file  Tobacco Use  . Smoking status: Former Smoker    Packs/day: 2.00    Years: 34.00    Pack years: 68.00    Types: Cigarettes    Last attempt to quit: 07/16/2000    Years since quitting: 18.0  . Smokeless tobacco: Never Used  Substance and Sexual Activity  . Alcohol use: Yes  . Drug use: No  . Sexual activity:  Not on file  Lifestyle  . Physical activity:    Days per week: Not on file    Minutes per session: Not on file  . Stress: Not on file  Relationships  . Social connections:    Talks on phone: Not on file    Gets together: Not on file    Attends religious service: Not on file    Active member of club or organization: Not on file    Attends meetings of clubs or organizations: Not on file    Relationship status: Not on file  Other Topics Concern  . Not on file  Social History Narrative  . Not on file     Family History: The patient's family history includes Arthritis in his mother; Asthma in his mother; Heart attack in his father; Hypertension in his father; Stroke in his paternal grandmother. There is no history of Colon cancer. ROS:   Please see the history of present illness.    All other systems reviewed and are negative.  EKGs/Labs/Other Studies Reviewed:    The following studies were reviewed today:   Recent Labs: 05/14/2018: B Natriuretic Peptide 1,247.0 05/15/2018: TSH 2.768 05/16/2018: Magnesium 1.8 05/18/2018: ALT 13 06/03/2018: Hemoglobin 11.8; Platelets 246 06/13/2018: BUN 30; Creatinine, Ser 1.56; Potassium 3.1; Sodium 138  Recent Lipid Panel No results found for: CHOL, TRIG, HDL, CHOLHDL, VLDL, LDLCALC, LDLDIRECT  Physical Exam:    VS:  BP 130/78 (BP Location: Left Arm, Patient Position: Sitting, Cuff Size: Normal)   Pulse 63   Ht 5' 9" (1.753 m)   Wt 143 lb (64.9 kg)   SpO2 96%   BMI 21.12 kg/m     Wt Readings from Last 3 Encounters:  07/23/18 143 lb (64.9 kg)  06/06/18 146 lb 12.8 oz (66.6 kg)  06/03/18  146 lb (66.2 kg)     GEN:  Well nourished, well developed in no acute distress HEENT: Normal NECK: No JVD; No carotid bruits LYMPHATICS: No lymphadenopathy CARDIAC: Irr Irr variable s1 sharp CS of AVR no murmurs, RESPIRATORY:  Clear to auscultation without rales, wheezing or rhonchi  ABDOMEN: Soft, non-tender, non-distended MUSCULOSKELETAL:  No edema; No deformity  SKIN: Warm and dry NEUROLOGIC:  Alert and oriented x 3 PSYCHIATRIC:  Normal affect    Signed, Shirlee More, MD  07/23/2018 9:24 AM    [place]

## 2018-07-22 NOTE — Therapy (Signed)
[facility] [address]  [place], [place], [address] Phone: [phone]   Fax:  [phone]  Physical Therapy Treatment  Patient Details  Name: Lance Hicks MRN: [id number] Date of Birth: 05/22/1946 Referring Provider: Dr. Stann Mainland   Encounter Date: 07/22/2018  PT End of Session - 07/22/18 1321    Visit Number  10    Number of Visits  17    Date for PT Re-Evaluation  07/30/18    Authorization Type  Medicare [place] F    PT Start Time  1316    PT Stop Time  1358    PT Time Calculation (min)  42 min    Activity Tolerance  Patient tolerated treatment well    Behavior During Therapy  [facility] for tasks assessed/performed       Past Medical History:  Diagnosis Date  . 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 In light of concurrent left nephrolithiasis, will check CT renal colic next year instead of renal US.   [name] Anticoagulated on Coumadin 01/04/2018  . Anxiety 05/10/2016   Last Assessment & Plan:  Doing well off of zoloft.  . Asymptomatic microscopic hematuria 06/20/2017   Last Assessment & Plan:  Had hematuria workup in [facility] in 2016 which negative CT and cystoscopy. UA with 2+ blood last visit - we discussed recommendation for repeat workup at 5 years or if degree of hematuria progresses.   . Atrial fibrillation ([place]) 03/29/2017  . Atrial flutter ([place]) 03/29/2017  . Chronic allergic rhinitis 04/28/2016   Last Assessment & Plan:  Continue astelin  . Chronic anticoagulation 03/29/2017  . Chronic atrial fibrillation ([place]) 07/23/2015   Last Assessment & Plan:  Coumadin and metoprolol, cardiology referral to establish care.  . Chronic midline back pain 12/14/2015   Last Assessment & Plan:  Pain management referral for further evaluation.  . Chronic prostatitis 07/23/2015   Last Assessment & Plan:  Has largely resolved since stopping bike riding. Recommend annual DRE AND  PSA - will see back 12/2015 for annual screening, given 1st degree fhx. To call office for recurrent prostatitis symptoms.   [name] COPD (chronic obstructive pulmonary disease) ([place]) 06/12/2016  . Coronary artery disease involving native coronary artery of native heart with angina pectoris ([place]) 03/29/2017  . Cough 10/17/2016   Last Assessment & Plan:  Discussed typical course for acute viral illness. If symptoms worsen or fail to improve by 7-10d, delayed ATBs, fluids, rest, NSAIDs/APAP prn. Seek care if not improving. Needs earlier INR check due to ATBs.  [name] Dyspnea 02/01/2016   Last Assessment & Plan:  Overall improving, eval by pulm, plan for CT, neg stress test with cardiology. Recent switch to carvedilol due to side effects.  . Epidermoid cyst of skin 08/24/2017  . Essential hypertension 12/14/2015   Last Assessment & Plan:  Hypertension control: controlled  Medications: compliant Medication Management: as noted in orders Home blood pressure monitoring recommended additionally as needed for symptoms  The patient's care plan was reviewed and updated. Instructions and counseling were provided regarding patient goals and barriers. He was counseled to adopt a healthy lifestyle. Educational resources and self-management tools have been provided as charted in [facility] list.   . H/O maze procedure 03/29/2017  . [name] 03/29/2017   Overview:  2011  . Hx of CABG 03/29/2017  . Hyperlipidemia 03/29/2017  . Hypertensive heart disease 03/29/2017  .  Kidney stones 07/23/2015   Overview:  x 3  Last Assessment & Plan:  By [place] has left nephrolithiasis, but not visible by KUB. Will check CT renal colic next year to assess both stone burden as well as to surveil AML.   . Leukocytoclastic vasculitis ([place]) 10/01/2017  . Localized edema 01/04/2018  . Lumbar radicular pain 01/19/2016  . Maculopapular rash 09/03/2017  . Nephrolithiasis 07/23/2015   Overview:  x 3  Last Assessment & Plan:  By [place] has left  nephrolithiasis, but not visible by KUB. Will check CT renal colic next year to assess both stone burden as well as to surveil AML.   Overview:  x 3  Last Assessment & Plan:  Has 72m nonobstructing LUP stone - not visible by KUB.  Will check renal [place] - he will contact office sooner if symptomatic.   . Non-sustained ventricular tachycardia ([place] 03/29/2017  . Other hyperlipidemia 03/29/2017  . Palpitations 10/01/2017  . Paroxysmal atrial fibrillation ([place] 03/29/2017  . Paroxysmal atrial fibrillation ([place] 03/29/2017  . Pleural effusion, bilateral 08/09/2017  . Post-nasal drainage 02/25/2016   Last Assessment & Plan:  Trial zyrtec and flonase  . Prostate cancer screening 06/20/2017   Last Assessment & Plan:  Recommend continued annual CaP screening until within 10 years of life expectancy. Given good health and fhx of longevity, would anticipate CaP screening to continue until age 72  PSA today and again in one year on day of visit.  . Pulmonary hypertension ([place] 08/09/2017  . Pulmonary nodules 06/12/2016  . S/P AVR (aortic valve replacement) 03/20/2016  . Supratherapeutic INR 07/26/2017  . Syncope 03/29/2017  . Syncope and collapse 02/01/2016  . Typical atrial flutter ([place] 02/01/2016    Past Surgical History:  Procedure Laterality Date  . CHOLECYSTECTOMY    . CORONARY ARTERY BYPASS GRAFT    . EXPLORATORY LAPAROTOMY  07/30/2017  . FOOT SURGERY    . FRACTURE SURGERY Right    wrist and forearm  . HERNIA REPAIR    . MECHANICAL AORTIC VALVE REPLACEMENT    . NASAL SINUS SURGERY    . RIGHT HEART CATH N/A 01/18/2018   Procedure: RIGHT HEART CATH;  Surgeon: BJolaine Artist MD;  [facility];  Service: Cardiovascular;  Laterality: N/A;  . RIGHT HEART CATH N/A 05/14/2018   Procedure: RIGHT HEART CATH;  Surgeon: BJolaine Artist MD;  [facility];  Service: Cardiovascular;  Laterality: N/A;  . TEE WITHOUT CARDIOVERSION N/A 05/21/2018   Procedure: TRANSESOPHAGEAL  ECHOCARDIOGRAM (TEE);  Surgeon: BJolaine Artist MD;  [facility];  Service: Cardiovascular;  Laterality: N/A;  . UPPER GASTROINTESTINAL ENDOSCOPY  07/12/2017   Patchy areas of mucosal inflammation noted in the antrum with edema,erthema and ulcerations. Bx. Chronicfocally active gastritis.  .Marland KitchenVASECTOMY      There were no vitals filed for this visit.  Subjective Assessment - 07/22/18 1319    Subjective  Doing well and notes taping came off of shoulder and forearm on Saturday night with good benefit noted.  85% improvement in shoulder function since starting therapy.      Pertinent History  a fib, a flutter, chronic anticoagulation, chronic back pain, COPD, CAD, dyspnea, HTN, aortic valve replacement 2018, CABG 2018, HLD, kidney stones, leukocytoclastic vasculitis, pleural effusion, syncope, wrist and forearm fx surgery    Diagnostic tests  R shoulder xray 05/17/18: No acute fracture or dislocation; Osteoarthritic change noted in the right glenohumeral joint. R shoulder MRI: 05/26:  Full-thickness tear of the vast majority of the supraspinatus tendon; Mild distal infraspinatus partial-thickness articular surface tearing. Mild subscapularis tendinopathy. Poor visualization of the intra-articular segment of the long head of the biceps compatible with at least partial tearing.    Currently in Pain?  No/denies    Pain Score  0-No pain    Multiple Pain Sites  No         [facility] PT Assessment - 07/22/18 1325      AROM   Right/Left Shoulder  Right    Right Shoulder Flexion  125 Degrees    Right Shoulder ABduction  120 Degrees    Right Shoulder Internal Rotation  -- FIR to T12    Right Shoulder External Rotation  -- R FER to C6      PROM   Right/Left Shoulder  Right    Right Shoulder Flexion  147 Degrees    Right Shoulder ABduction  112 Degrees    Right Shoulder Internal Rotation  65 Degrees    Right Shoulder External Rotation  70 Degrees      Strength   Right/Left Shoulder   Right    Right Shoulder Flexion  4/5    Right Shoulder ABduction  4/5    Right Shoulder Internal Rotation  4/5    Right Shoulder External Rotation  4-/5    Left Shoulder Flexion  4+/5    Left Shoulder ABduction  4+/5    Left Shoulder Internal Rotation  4+/5    Left Shoulder External Rotation  4/5    Right/Left Elbow  Right;Left    Right Elbow Flexion  4+/5    Right Elbow Extension  4+/5    Left Elbow Flexion  4+/5    Left Elbow Extension  4+/5                   [facility] Adult PT Treatment/Exercise - 07/22/18 1353      Self-Care   Self-Care  Other Self-Care Comments    Other Self-Care Comments   Review of self-ball ball release to R rhomboids on wall for home use with tennis ball       Shoulder Exercises: Standing   External Rotation  Right;10 reps;Theraband    Theraband Level (Shoulder External Rotation)  Level 2 (Red)    Internal Rotation  Right;10 reps;Theraband    Theraband Level (Shoulder Internal Rotation)  Level 3 (Green)    Extension  10 reps;Theraband;Strengthening    Theraband Level (Shoulder Extension)  Level 3 (Green)    Row  10 reps;Both;Strengthening;Theraband    Theraband Level (Shoulder Row)  Level 2 (Red)      Shoulder Exercises: Pulleys   Flexion  3 minutes    Scaption  3 minutes      Manual Therapy   Manual Therapy  Soft tissue mobilization;Passive ROM;Taping    Manual therapy comments  supine     Soft tissue mobilization  STM to posterior/inferior shoulder in area of tenderness     Myofascial Release  TPR to R rhomboids in area of tenderness     Passive ROM  R shoulder PROM in all planes to tolerance     Kinesiotex  Create Space      Kinesiotix   Create Space  R shoulder impingement taping pattern                PT Short Term Goals - 07/17/18 1324      PT SHORT TERM GOAL #1   Title  Patient  to be independent with inital HEP.    Time  4    Period  Weeks    Status  Achieved        PT Long Term Goals - 07/22/18 1336       PT LONG TERM GOAL #1   Title  Patient to be independent with advanced HEP.    Time  8    Period  Weeks    Status  Partially Met Met for current       PT LONG TERM GOAL #2   Title  Patient to demonstrate R shoulder AROM/PROM WFL without pain limiting.     Time  8    Period  Weeks    Status  On-going      PT LONG TERM GOAL #3   Title  Patient to demonstrate >=4+/5 R UE strength.    Time  8    Period  Weeks    Status  Partially Met      PT LONG TERM GOAL #4   Title  Patient to demonstrate lifting 15# box overhead with <=1/10 pain.    Time  8    Period  Weeks    Status  Partially Met Able to lift 10# to shoulder height and above without pain             Plan - 07/22/18 1321    Clinical Impression Statement  Pt. noting ~ 85% improvement in shoulder pain since starting therapy.  Noted most difficulty with overhead movements still at this point.  Tolerated manual therapy targeted at posterior/inferior shoulder well today in area of tenderness.  Progressed TB resisted RTC/scapular strengthening without issue with green TB issued to pt. for scap. strengthening.  Will continue to progress toward goals.      PT Treatment/Interventions  ADLs/Self Care Home Management;Electrical Stimulation;Cryotherapy;Moist Heat;Therapeutic exercise;Therapeutic activities;Functional mobility training;Ultrasound;Patient/family education;Manual techniques;Vasopneumatic Device;Taping;Splinting;Energy conservation;Dry needling;Passive range of motion    Consulted and Agree with Plan of Care  Patient       Patient will benefit from skilled therapeutic intervention in order to improve the following deficits and impairments:  Hypomobility, Decreased activity tolerance, Decreased strength, Pain, Impaired UE functional use, Decreased range of motion, Impaired flexibility, Postural dysfunction  Visit Diagnosis: Acute pain of right shoulder  Stiffness of right shoulder, not elsewhere classified  Muscle weakness  (generalized)  Other symptoms and signs involving the musculoskeletal system     Problem List Patient Active Problem List   Diagnosis Date Noted  . Hypotension, chronic 06/06/2018  . Enterococcal bacteremia   . Pulmonary edema   . Shoulder pain   . PAH (pulmonary artery hypertension) ([place]) 02/20/2018  . Hypokalemia 02/18/2018  . Anticoagulated on Coumadin 01/04/2018  . Localized edema 01/04/2018  . Leukocytoclastic vasculitis ([place]) 10/01/2017  . Palpitations 10/01/2017  . Maculopapular rash 09/03/2017  . Epidermoid cyst of skin 08/24/2017  . Pleural effusion, bilateral 08/09/2017  . Pulmonary hypertension ([place]) 08/09/2017  . Supratherapeutic INR 07/26/2017  . Asymptomatic microscopic hematuria 06/20/2017  . Prostate cancer screening 06/20/2017  . Atrial flutter ([address]) 03/29/2017  . Chronic anticoagulation 03/29/2017  . Coronary artery disease involving native coronary artery of native heart with angina pectoris ([place]) 03/29/2017  . H/O maze procedure 03/29/2017  . [name] 03/29/2017  . Hx of CABG 03/29/2017  . Hypertensive heart disease with heart failure ([place]) 03/29/2017  . Non-sustained ventricular tachycardia ([place]) 03/29/2017  . Hyperlipidemia 03/29/2017  . Syncope 03/29/2017  . Cough 10/17/2016  .  COPD (chronic obstructive pulmonary disease) ([place]) 06/12/2016  . Pulmonary nodules 06/12/2016  . Anxiety 05/10/2016  . 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 Chronic allergic rhinitis 04/28/2016  . History of prosthetic aortic valve 03/20/2016  . Post-nasal drainage 02/25/2016  . Dyspnea 02/01/2016  . Syncope and collapse 02/01/2016  . Typical atrial flutter ([place]) 02/01/2016  . Lumbar radicular pain 01/19/2016  . Chronic midline back pain 12/14/2015  . Essential hypertension 12/14/2015  . Chronic prostatitis 07/23/2015  . Nephrolithiasis 07/23/2015    Bess Harvest, PTA 07/22/18 5:51 PM   [facility] [address]  [place], [place], [address] Phone: [phone]   Fax:  [phone]  Name: RYU CERRETA MRN: [id number] Date of Birth: February 10, 1946

## 2018-07-23 ENCOUNTER — Ambulatory Visit (INDEPENDENT_AMBULATORY_CARE_PROVIDER_SITE_OTHER): Payer: Medicare Other | Admitting: Cardiology

## 2018-07-23 ENCOUNTER — Encounter: Payer: Self-pay | Admitting: Cardiology

## 2018-07-23 VITALS — BP 130/78 | HR 63 | Ht 69.0 in | Wt 143.0 lb

## 2018-07-23 DIAGNOSIS — I9589 Other hypotension: Secondary | ICD-10-CM

## 2018-07-23 DIAGNOSIS — I4892 Unspecified atrial flutter: Secondary | ICD-10-CM | POA: Diagnosis not present

## 2018-07-23 DIAGNOSIS — I2721 Secondary pulmonary arterial hypertension: Secondary | ICD-10-CM

## 2018-07-23 DIAGNOSIS — I739 Peripheral vascular disease, unspecified: Secondary | ICD-10-CM

## 2018-07-23 DIAGNOSIS — Z7901 Long term (current) use of anticoagulants: Secondary | ICD-10-CM

## 2018-07-23 DIAGNOSIS — I110 Hypertensive heart disease with heart failure: Secondary | ICD-10-CM

## 2018-07-23 NOTE — Patient Instructions (Addendum)
Medication Instructions:  Your physician recommends that you continue on your current medications as directed. Please refer to the Current Medication list given to you today.   Labwork: Your physician recommends that you return for lab work today: CBC, BMP, and INR.    Testing/Procedures: None.  Follow-Up: Your physician wants you to follow-up in: 3 months. You will receive a reminder letter in the mail two months in advance. If you don't receive a letter, please call our office to schedule the follow-up appointment.   Any Other Special Instructions Will Be Listed Below (If Applicable).     If you need a refill on your cardiac medications before your next appointment, please call your pharmacy.    Heart Failure  Weigh yourself every morning when you first wake up and record on a calender or note pad, bring this to your office visits. Using a pill tender can help with taking your medications consistently.  Limit your fluid intake to 2 liters daily  Limit your sodium intake to less than 2-3 grams daily. Ask if you need dietary teaching.  If you gain more than 3 pounds (from your dry weight ), double your dose of diuretic for the day.  If you gain more than 5 pounds (from your dry weight), double your dose of lasix and call your heart failure doctor.  Please do not smoke tobacco since it is very bad for your heart.  Please do not drink alcohol since it can worsen your heart failure.Also avoid OTC nonsteroidal drugs, such as advil, aleve and motrin.  Try to exercise for at least 30 minutes every day because this will help your heart be more efficient. You may be eligible for supervised cardiac rehab, ask your physician.

## 2018-07-24 ENCOUNTER — Telehealth: Payer: Self-pay

## 2018-07-24 ENCOUNTER — Ambulatory Visit: Payer: Medicare Other | Admitting: Physical Therapy

## 2018-07-24 ENCOUNTER — Encounter: Payer: Self-pay | Admitting: Physical Therapy

## 2018-07-24 DIAGNOSIS — M25511 Pain in right shoulder: Secondary | ICD-10-CM

## 2018-07-24 DIAGNOSIS — M25611 Stiffness of right shoulder, not elsewhere classified: Secondary | ICD-10-CM

## 2018-07-24 DIAGNOSIS — M6281 Muscle weakness (generalized): Secondary | ICD-10-CM | POA: Diagnosis not present

## 2018-07-24 DIAGNOSIS — R29898 Other symptoms and signs involving the musculoskeletal system: Secondary | ICD-10-CM | POA: Diagnosis not present

## 2018-07-24 LAB — CBC
Hematocrit: 36.9 % — ABNORMAL LOW (ref 37.5–51.0)
Hemoglobin: 12.5 g/dL — ABNORMAL LOW (ref 13.0–17.7)
MCH: 28.9 pg (ref 26.6–33.0)
MCHC: 33.9 g/dL (ref 31.5–35.7)
MCV: 85 fL (ref 79–97)
Platelets: 283 10*3/uL (ref 150–450)
RBC: 4.33 x10E6/uL (ref 4.14–5.80)
RDW: 17.6 % — ABNORMAL HIGH (ref 12.3–15.4)
WBC: 6.9 10*3/uL (ref 3.4–10.8)

## 2018-07-24 LAB — BASIC METABOLIC PANEL
BUN/Creatinine Ratio: 24 (ref 10–24)
BUN: 26 mg/dL (ref 8–27)
CO2: 24 mmol/L (ref 20–29)
Calcium: 10.1 mg/dL (ref 8.6–10.2)
Chloride: 103 mmol/L (ref 96–106)
Creatinine, Ser: 1.07 mg/dL (ref 0.76–1.27)
GFR calc Af Amer: 80 mL/min/{1.73_m2} (ref >59)
GFR calc non Af Amer: 69 mL/min/{1.73_m2} (ref >59)
Glucose: 77 mg/dL (ref 65–99)
Potassium: 4.9 mmol/L (ref 3.5–5.2)
Sodium: 141 mmol/L (ref 134–144)

## 2018-07-24 LAB — PROTIME-INR
INR: 3.2 — ABNORMAL HIGH (ref 0.8–1.2)
Prothrombin Time: 31.2 s — ABNORMAL HIGH (ref 9.1–12.0)

## 2018-07-24 NOTE — Therapy (Signed)
[facility] [address]  [place], [place], [address] Phone: [phone]   Fax:  [phone]  Physical Therapy Treatment  Patient Details  Name: Lance Hicks MRN: [id number] Date of Birth: [birth year] Referring Provider: Dr. Stann Mainland   Encounter Date: 07/24/2018  PT End of Session - 07/24/18 1846    Visit Number  11    Number of Visits  15    Date for PT Re-Evaluation  08/21/18    Authorization Type  Medicare [place] [employer]    PT Start Time  1314    PT Stop Time  1357    PT Time Calculation (min)  43 min    Activity Tolerance  Patient tolerated treatment well;Patient limited by pain    Behavior During Therapy  [facility] for tasks assessed/performed       Past Medical History:  Diagnosis Date  . 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 In light of concurrent left nephrolithiasis, will check CT renal colic next year instead of renal US.   [name] Anticoagulated on Coumadin 01/04/2018  . Anxiety 05/10/2016   Last Assessment & Plan:  Doing well off of zoloft.  . Asymptomatic microscopic hematuria 06/20/2017   Last Assessment & Plan:  Had hematuria workup in [facility] in 2016 which negative CT and cystoscopy. UA with 2+ blood last visit - we discussed recommendation for repeat workup at 5 years or if degree of hematuria progresses.   . Atrial fibrillation ([place]) 03/29/2017  . Atrial flutter ([place]) 03/29/2017  . Chronic allergic rhinitis 04/28/2016   Last Assessment & Plan:  Continue astelin  . Chronic anticoagulation 03/29/2017  . Chronic atrial fibrillation ([employer]) 07/23/2015   Last Assessment & Plan:  Coumadin and metoprolol, cardiology referral to establish care.  . Chronic midline back pain 12/14/2015   Last Assessment & Plan:  Pain management referral for further evaluation.  . Chronic prostatitis 07/23/2015   Last Assessment & Plan:  Has largely resolved since stopping bike riding.  Recommend annual DRE AND PSA - will see back 12/2015 for annual screening, given 1st degree fhx. To call office for recurrent prostatitis symptoms.   [name] COPD (chronic obstructive pulmonary disease) ([place]) 06/12/2016  . Coronary artery disease involving native coronary artery of native heart with angina pectoris ([place]) 03/29/2017  . Cough 10/17/2016   Last Assessment & Plan:  Discussed typical course for acute viral illness. If symptoms worsen or fail to improve by 7-10d, delayed ATBs, fluids, rest, NSAIDs/APAP prn. Seek care if not improving. Needs earlier INR check due to ATBs.  [name] Dyspnea 02/01/2016   Last Assessment & Plan:  Overall improving, eval by pulm, plan for CT, neg stress test with cardiology. Recent switch to carvedilol due to side effects.  . Epidermoid cyst of skin 08/24/2017  . Essential hypertension 12/14/2015   Last Assessment & Plan:  Hypertension control: controlled  Medications: compliant Medication Management: as noted in orders Home blood pressure monitoring recommended additionally as needed for symptoms  The patient's care plan was reviewed and updated. Instructions and counseling were provided regarding patient goals and barriers. He was counseled to adopt a healthy lifestyle. Educational resources and self-management tools have been provided as charted in [employer] [facility] list.   . H/O maze procedure 03/29/2017  . [name] 03/29/2017   Overview:  2011  . Hx of CABG 03/29/2017  . Hyperlipidemia 03/29/2017  . Hypertensive  heart disease 03/29/2017  . Kidney stones 07/23/2015   Overview:  x 3  Last Assessment & Plan:  By [place] has left nephrolithiasis, but not visible by KUB. Will check CT renal colic next year to assess both stone burden as well as to surveil AML.   . Leukocytoclastic vasculitis ([place]) 10/01/2017  . Localized edema 01/04/2018  . Lumbar radicular pain 01/19/2016  . Maculopapular rash 09/03/2017  . Nephrolithiasis 07/23/2015   Overview:  x 3  Last Assessment & Plan:   By [place] has left nephrolithiasis, but not visible by KUB. Will check CT renal colic next year to assess both stone burden as well as to surveil AML.   Overview:  x 3  Last Assessment & Plan:  Has 83m nonobstructing LUP stone - not visible by KUB.  Will check renal [place] - he will contact office sooner if symptomatic.   . Non-sustained ventricular tachycardia ([place] 03/29/2017  . Other hyperlipidemia 03/29/2017  . Palpitations 10/01/2017  . Paroxysmal atrial fibrillation ([place] 03/29/2017  . Paroxysmal atrial fibrillation ([place] 03/29/2017  . Pleural effusion, bilateral 08/09/2017  . Post-nasal drainage 02/25/2016   Last Assessment & Plan:  Trial zyrtec and flonase  . Prostate cancer screening 06/20/2017   Last Assessment & Plan:  Recommend continued annual CaP screening until within 10 years of life expectancy. Given good health and fhx of longevity, would anticipate CaP screening to continue until age 72  PSA today and again in one year on day of visit.  . Pulmonary hypertension ([place] 08/09/2017  . Pulmonary nodules 06/12/2016  . S/P AVR (aortic valve replacement) 03/20/2016  . Supratherapeutic INR 07/26/2017  . Syncope 03/29/2017  . Syncope and collapse 02/01/2016  . Typical atrial flutter ([place] 02/01/2016    Past Surgical History:  Procedure Laterality Date  . CHOLECYSTECTOMY    . CORONARY ARTERY BYPASS GRAFT    . EXPLORATORY LAPAROTOMY  07/30/2017  . FOOT SURGERY    . FRACTURE SURGERY Right    wrist and forearm  . HERNIA REPAIR    . MECHANICAL AORTIC VALVE REPLACEMENT    . NASAL SINUS SURGERY    . RIGHT HEART CATH N/A 01/18/2018   Procedure: RIGHT HEART CATH;  Surgeon: BJolaine Artist MD;  [facility];  Service: Cardiovascular;  Laterality: N/A;  . RIGHT HEART CATH N/A 05/14/2018   Procedure: RIGHT HEART CATH;  Surgeon: BJolaine Artist MD;  [facility];  Service: Cardiovascular;  Laterality: N/A;  . TEE WITHOUT CARDIOVERSION N/A 05/21/2018   Procedure:  TRANSESOPHAGEAL ECHOCARDIOGRAM (TEE);  Surgeon: BJolaine Artist MD;  [facility];  Service: Cardiovascular;  Laterality: N/A;  . UPPER GASTROINTESTINAL ENDOSCOPY  07/12/2017   Patchy areas of mucosal inflammation noted in the antrum with edema,erthema and ulcerations. Bx. Chronicfocally active gastritis.  .Marland KitchenVASECTOMY      There were no vitals filed for this visit.  Subjective Assessment - 07/24/18 1315    Subjective  Reports he would like to continue with therapy. Reports 85% improvement since initial eval.     Pertinent History  a fib, a flutter, chronic anticoagulation, chronic back pain, COPD, CAD, dyspnea, HTN, aortic valve replacement 2018, CABG 2018, HLD, kidney stones, leukocytoclastic vasculitis, pleural effusion, syncope, wrist and forearm fx surgery    Diagnostic tests  R shoulder xray 05/17/18: No acute fracture or dislocation; Osteoarthritic change noted in the right glenohumeral joint. R shoulder MRI: 05/26: Full-thickness tear of the vast majority of the supraspinatus  tendon; Mild distal infraspinatus partial-thickness articular surface tearing. Mild subscapularis tendinopathy. Poor visualization of the intra-articular segment of the long head of the biceps compatible with at least partial tearing.    Currently in Pain?  Yes    Pain Score  6     Pain Location  Arm "bicep"    Pain Orientation  Right;Distal    Pain Descriptors / Indicators  Sharp    Pain Type  Acute pain         [facility] PT Assessment - 07/24/18 0001      AROM   Right/Left Shoulder  Right    Right Shoulder Flexion  125 Degrees    Right Shoulder ABduction  116 Degrees    Right Shoulder Internal Rotation  -- FIR T12    Right Shoulder External Rotation  -- FER C6      PROM   Right Shoulder Flexion  147 Degrees    Right Shoulder ABduction  112 Degrees    Right Shoulder Internal Rotation  65 Degrees    Right Shoulder External Rotation  70 Degrees      Strength   Right Shoulder Flexion  4/5     Right Shoulder ABduction  4/5    Right Shoulder Internal Rotation  4/5    Right Shoulder External Rotation  4-/5                   [facility] Adult PT Treatment/Exercise - 07/24/18 0001      Shoulder Exercises: Prone   Other Prone Exercises  prone R UE I, T, Y over green pball; 10x each VC/TCs to correct form      Shoulder Exercises: Sidelying   External Rotation  Right;10 reps;Strengthening    External Rotation Weight (lbs)  1    External Rotation Limitations  2x10    ABduction  Right;10 reps;Limitations;Weights    ABduction Weight (lbs)  1    ABduction Limitations  thumb up; pt with severe pain in R lateral shoulder with 1st rep- improved after subsequent reps       Shoulder Exercises: Standing   External Rotation  Right;10 reps;Theraband    Theraband Level (Shoulder External Rotation)  Level 1 (Yellow)    External Rotation Limitations  dowel under elbow; VCs for form    Internal Rotation  Right;10 reps;Theraband pt reporting pain with this acitivty in biceps- discontinued    Theraband Level (Shoulder Internal Rotation)  Level 1 (Yellow)    Internal Rotation Limitations  dowel under elbow; VCs for form    Extension  Theraband;Strengthening;15 reps    Theraband Level (Shoulder Extension)  Level 2 (Red)    Extension Limitations  cues for scap retraction     Row  Both;Strengthening;Theraband;15 reps;Limitations    Theraband Level (Shoulder Row)  Level 2 (Red)    Row Limitations  VC/TCs for form      Shoulder Exercises: Pulleys   Flexion  3 minutes    Scaption  3 minutes      Shoulder Exercises: Stretch   Corner Stretch  2 reps;20 seconds    Corner Stretch Limitations  90/90 doorway pec stretch R UE    Other Shoulder Stretches  R bicep stretch 2x20"      Manual Therapy   Manual Therapy  Soft tissue mobilization;Passive ROM;Taping    Soft tissue mobilization  STM to R infraspinatus and brachioradialis- extremely TTP and soft tissue restriction in infraspinatus     Passive ROM  R shoulder PROM in all  planes to tolerance       [employer]   [employer] Space  R shoulder impingement taping pattern                PT Short Term Goals - 07/24/18 1404      PT SHORT TERM GOAL #1   Title  Patient to be independent with inital HEP.    Time  4    Period  Weeks    Status  Achieved        PT Long Term Goals - 07/24/18 1404      PT LONG TERM GOAL #1   Title  Patient to be independent with advanced HEP.    Time  4    Period  Weeks    Status  Partially Met Met for current     Target Date  08/21/18      PT LONG TERM GOAL #2   Title  Patient to demonstrate R shoulder AROM/PROM [facility] without pain limiting.     Time  4    Period  Weeks    Status  On-going improvements made in R shoulder AROM flexion and abduction, and all PROM    Target Date  08/21/18      PT LONG TERM GOAL #3   Title  Patient to demonstrate >=4+/5 R UE strength.    Time  8    Period  Weeks    Status  Partially Met most limited in R shoulder ER, 4-/5    Target Date  08/21/18      PT LONG TERM GOAL #4   Title  Patient to demonstrate lifting 15# box overhead with <=1/10 pain.    Time  4    Period  Weeks    Status  Partially Met Able to lift 10# to shoulder height and above without pain     Target Date  08/21/18            Plan - 07/24/18 1849    Clinical Impression Statement  Patient arrived to session with 85% improvement since intial eval. Seen with 2 bandaids on R forearm, reporting he was "playing with the dog." Still struggling with R "biceps pain," patient pointing to anterolateral forearm. Updated goals this session- improvements made in R shoulder AROM flexion and abduction, and all PROM. Patient still limited in R shoulder ER strength. Able to reach to overhead cabinet with limited weight. Tolerated STM to R infraspinatus and brachioradialis- extremely TTP in these areas with soft tissue restriction in infraspinatus. Also tolerated PROM, although patient limited by  pain at end ranges. Required frequent VC/TCs to correct form throughout session. Tolerated progression to prone over ball I, T, Y's without pain. However pain in anterolateral forearm noted with banded resistance IR this date. Pain inconsistent at times. Advised patient to follow up with MD about this pain. {Patient reported understanding. Received kinestiotape to R shoulder at end fo session for pain relief. Reminded patient of precautions and removal to avoid skin tears. Patient reported understanding. Patient showing improvements thus far and will benefit from continued PT services 1x/week for 4 weeks to address remaining goals.     PT Treatment/Interventions  ADLs/Self Care Home Management;Electrical Stimulation;Cryotherapy;Moist Heat;Therapeutic exercise;Therapeutic activities;Functional mobility training;Ultrasound;Patient/family education;Manual techniques;Vasopneumatic Device;Taping;Splinting;Energy conservation;Dry needling;Passive range of motion    Consulted and Agree with Plan of Care  Patient       Patient will benefit from skilled therapeutic intervention in order to improve the following deficits and impairments:  Hypomobility, Decreased activity  tolerance, Decreased strength, Pain, Impaired UE functional use, Decreased range of motion, Impaired flexibility, Postural dysfunction  Visit Diagnosis: Acute pain of right shoulder  Stiffness of right shoulder, not elsewhere classified  Muscle weakness (generalized)  Other symptoms and signs involving the musculoskeletal system     Problem List Patient Active Problem List   Diagnosis Date Noted  . Hypotension, chronic 06/06/2018  . Enterococcal bacteremia   . Pulmonary edema   . Shoulder pain   . PAH (pulmonary artery hypertension) ([place]) 02/20/2018  . Hypokalemia 02/18/2018  . Anticoagulated on Coumadin 01/04/2018  . Localized edema 01/04/2018  . Leukocytoclastic vasculitis ([place]) 10/01/2017  . Palpitations 10/01/2017  .  Maculopapular rash 09/03/2017  . Epidermoid cyst of skin 08/24/2017  . Pleural effusion, bilateral 08/09/2017  . Pulmonary hypertension ([place]) 08/09/2017  . Supratherapeutic INR 07/26/2017  . Asymptomatic microscopic hematuria 06/20/2017  . Prostate cancer screening 06/20/2017  . Atrial flutter ([place]) 03/29/2017  . Chronic anticoagulation 03/29/2017  . Coronary artery disease involving native coronary artery of native heart with angina pectoris ([place]) 03/29/2017  . H/O maze procedure 03/29/2017  . [name] 03/29/2017  . Hx of CABG 03/29/2017  . Hypertensive heart disease with heart failure ([place]) 03/29/2017  . Non-sustained ventricular tachycardia ([place]) 03/29/2017  . Hyperlipidemia 03/29/2017  . Syncope 03/29/2017  . Cough 10/17/2016  . COPD (chronic obstructive pulmonary disease) ([place]) 06/12/2016  . Pulmonary nodules 06/12/2016  . Anxiety 05/10/2016  . 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 Chronic allergic rhinitis 04/28/2016  . History of prosthetic aortic valve 03/20/2016  . Post-nasal drainage 02/25/2016  . Dyspnea 02/01/2016  . Syncope and collapse 02/01/2016  . Typical atrial flutter ([place]) 02/01/2016  . Lumbar radicular pain 01/19/2016  . Chronic midline back pain 12/14/2015  . Essential hypertension 12/14/2015  . Chronic prostatitis 07/23/2015  . Nephrolithiasis 07/23/2015     Janene Harvey, PT, DPT 07/24/18 6:58 PM   [facility] [address]  [place], [place], [address] Phone: [phone]   Fax:  [phone]  Name: Lance Hicks MRN: [id number] Date of Birth: Jan 01, 1946

## 2018-07-24 NOTE — Telephone Encounter (Signed)
Left voicemail for the patient to call the office to discuss results and coumadin.

## 2018-07-29 ENCOUNTER — Other Ambulatory Visit: Payer: Self-pay

## 2018-07-31 ENCOUNTER — Ambulatory Visit: Payer: Medicare Other

## 2018-07-31 ENCOUNTER — Other Ambulatory Visit: Payer: Self-pay

## 2018-07-31 ENCOUNTER — Ambulatory Visit (INDEPENDENT_AMBULATORY_CARE_PROVIDER_SITE_OTHER): Payer: Medicare Other

## 2018-07-31 DIAGNOSIS — I4892 Unspecified atrial flutter: Secondary | ICD-10-CM

## 2018-07-31 DIAGNOSIS — Z7901 Long term (current) use of anticoagulants: Secondary | ICD-10-CM | POA: Diagnosis not present

## 2018-07-31 LAB — POCT INR: INR: 1.5 — AB (ref 2.0–3.0)

## 2018-07-31 MED ORDER — WARFARIN SODIUM 5 MG PO TABS: ORAL_TABLET | ORAL | 0 refills | Status: DC

## 2018-07-31 NOTE — Patient Instructions (Signed)
Description   Take 5 mg today and tomorrow then continue dose of 2.5 mg daily except for 5 mg on Friday's. Please call our office with any medication changes or concerns [phone].

## 2018-08-01 ENCOUNTER — Encounter (HOSPITAL_COMMUNITY): Payer: Medicare Other | Admitting: Internal Medicine

## 2018-08-05 ENCOUNTER — Encounter: Payer: Self-pay | Admitting: Emergency Medicine

## 2018-08-05 ENCOUNTER — Ambulatory Visit (INDEPENDENT_AMBULATORY_CARE_PROVIDER_SITE_OTHER): Payer: Medicare Other | Admitting: Emergency Medicine

## 2018-08-05 ENCOUNTER — Encounter: Payer: Medicare Other | Admitting: Physical Therapy

## 2018-08-05 DIAGNOSIS — I2510 Atherosclerotic heart disease of native coronary artery without angina pectoris: Secondary | ICD-10-CM

## 2018-08-05 DIAGNOSIS — R918 Other nonspecific abnormal finding of lung field: Secondary | ICD-10-CM

## 2018-08-05 DIAGNOSIS — J449 Chronic obstructive pulmonary disease, unspecified: Secondary | ICD-10-CM | POA: Diagnosis not present

## 2018-08-05 DIAGNOSIS — I2721 Secondary pulmonary arterial hypertension: Secondary | ICD-10-CM

## 2018-08-05 MED ORDER — ALBUTEROL SULFATE HFA 108 (90 BASE) MCG/ACT IN AERS: 2.0000 | INHALATION_SPRAY | RESPIRATORY_TRACT | 5 refills | Status: DC | PRN

## 2018-08-05 NOTE — Assessment & Plan Note (Signed)
Mixed obstruction and restriction by pulmonary function testing.  He is not currently on bronchodilator therapy.  He is been on Pulmicort at some point in the past but not currently.  Given his good exertional tolerance not clear to me that a schedule bronchodilator is indicated.  I like to try him on albuterol to see if he can benefit with heavier exertion, when he is having dyspnea.  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  I explained to him that our goal will be to keep his oxygen saturations greater than 88% at those times.  He already has a portable oxygen concentrator available.

## 2018-08-05 NOTE — Patient Instructions (Addendum)
We will plan to repeat your CT chest in August 2019 to follow your pulmonary nodule.  We will not start a scheduled inhaled medication at this time.  Try using albuterol 2 puffs up to every 4 hours if needed for shortness of breath to see if you benefit. Try to wear your oxygen with heavy exertion (heavy enough to make your O2 saturations drop below 88%) Continue your diuretics, macitentan and tadalafil as prescribed by cardiology.  Follow with Dr Lamonte Sakai in 1 month

## 2018-08-05 NOTE — Progress Notes (Signed)
Subjective:    Patient ID: Lance Hicks, male    DOB: 1946/01/07, 72 y.o.   MRN: [id number]  HPI 72 year old former smoker (30 pack years) with a history of coronary disease, CABG, permanent atrial fibrillation, aortic stenosis with valve replacement plus Maze procedure and left atrial appendage excision.  He has secondary pulmonary hypertension in the setting of all the above.  Also with chronic kidney disease, cirrhosis that is felt to be due to R heart failure / congestion.  He is on a diuretic regimen, warfarin for anticoagulation, macitentan, tadalafil. He also has a hx traumatic R PTX with a chest tube.   He was admitted to the hospital with apparent decompensation of his CHF, total body volume overload.  He had an enterococcal bacteremia that was treated with antibiotics.  His abdominal ultrasound confirmed cirrhosis with only mild ascites at that time.  High-resolution CT scan of his chest was done on 05/16/2018 which I have reviewed.  This was to evaluate for possible ILD.  He had base predominant groundglass attenuation, no UIP pattern.  Also identified a 0.9 cm right lower lobe solid pulmonary nodule. He has an inogen POC, never uses it but has seen documented desaturations w heavy exertion. He is able to exert, walks on a treadmill. He works on his small farm. He had a home sleep study, he can';t remember when. He was told that he didn't have OSA. He doesn't really feel limited by breathing. He has daily cough. Takes zyrtec prn.   Right heart catheterization 05/14/2018 was reviewed by me.  His PA pressures 69/24 (39), PAOP 20, Fick cardiac output 7.0.  There was no evidence of an intracardiac shunt.   TEE 05/21/2018 >> LVEF 50 to 55%, mechanical aortic valve, severely dilated left atrium, dilated right ventricle with severely reduced function, dilated right atrium.  No evidence endocarditis  Pulmonary function testing 01/21/2018 reviewed by me.  This shows mild mixed restriction and  obstruction without a bronchodilator response, restrictive lung volumes, and a profoundly decreased diffusion capacity that does not correct when adjusted for his alveolar volume.   Review of Systems  Constitutional: Negative for fever and unexpected weight change.  HENT: Negative for congestion, dental problem, ear pain, nosebleeds, postnasal drip, rhinorrhea, sinus pressure, sneezing, sore throat and trouble swallowing.   Eyes: Negative for redness and itching.  Respiratory: Positive for shortness of breath. Negative for cough, chest tightness and wheezing.   Cardiovascular: Negative for palpitations and leg swelling.  Gastrointestinal: Negative for nausea and vomiting.  Genitourinary: Negative for dysuria.  Musculoskeletal: Negative for joint swelling.  Skin: Negative for rash.  Neurological: Negative for headaches.  Hematological: Does not bruise/bleed easily.  Psychiatric/Behavioral: Negative for dysphoric mood. The patient is not nervous/anxious.     Past Medical History:  Diagnosis Date  . 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 In light of concurrent left nephrolithiasis, will check CT renal colic next year instead of renal US.   [name] Anticoagulated on Coumadin 01/04/2018  . Anxiety 05/10/2016   Last Assessment & Plan:  Doing well off of zoloft.  . Asymptomatic microscopic hematuria 06/20/2017   Last Assessment & Plan:  Had hematuria workup in [facility] in 2016 which negative CT and cystoscopy. UA with 2+ blood last visit - we discussed recommendation for repeat workup at 5 years or if degree of hematuria progresses.   . Atrial fibrillation ([place]) 03/29/2017  . Atrial flutter ([place])  03/29/2017  . Chronic allergic rhinitis 04/28/2016   Last Assessment & Plan:  Continue astelin  . Chronic anticoagulation 03/29/2017  . Chronic atrial fibrillation ([place]) 07/23/2015   Last Assessment & Plan:  Coumadin and metoprolol, cardiology referral to  establish care.  . Chronic midline back pain 12/14/2015   Last Assessment & Plan:  Pain management referral for further evaluation.  . Chronic prostatitis 07/23/2015   Last Assessment & Plan:  Has largely resolved since stopping bike riding. Recommend annual DRE AND PSA - will see back 12/2015 for annual screening, given 1st degree fhx. To call office for recurrent prostatitis symptoms.   [name] COPD (chronic obstructive pulmonary disease) ([place]) 06/12/2016  . Coronary artery disease involving native coronary artery of native heart with angina pectoris ([place]) 03/29/2017  . Cough 10/17/2016   Last Assessment & Plan:  Discussed typical course for acute viral illness. If symptoms worsen or fail to improve by 7-10d, delayed ATBs, fluids, rest, NSAIDs/APAP prn. Seek care if not improving. Needs earlier INR check due to ATBs.  [name] Dyspnea 02/01/2016   Last Assessment & Plan:  Overall improving, eval by pulm, plan for CT, neg stress test with cardiology. Recent switch to carvedilol due to side effects.  . Epidermoid cyst of skin 08/24/2017  . Essential hypertension 12/14/2015   Last Assessment & Plan:  Hypertension control: controlled  Medications: compliant Medication Management: as noted in orders Home blood pressure monitoring recommended additionally as needed for symptoms  The patient's care plan was reviewed and updated. Instructions and counseling were provided regarding patient goals and barriers. He was counseled to adopt a healthy lifestyle. Educational resources and self-management tools have been provided as charted in [facility] list.   . H/O maze procedure 03/29/2017  . [name] 03/29/2017   Overview:  2011  . Hx of CABG 03/29/2017  . Hyperlipidemia 03/29/2017  . Hypertensive heart disease 03/29/2017  . Kidney stones 07/23/2015   Overview:  x 3  Last Assessment & Plan:  By [place] has left nephrolithiasis, but not visible by KUB. Will check CT renal colic next year to assess both stone burden as  well as to surveil AML.   . Leukocytoclastic vasculitis ([place]) 10/01/2017  . Localized edema 01/04/2018  . Lumbar radicular pain 01/19/2016  . Maculopapular rash 09/03/2017  . Nephrolithiasis 07/23/2015   Overview:  x 3  Last Assessment & Plan:  By [place] has left nephrolithiasis, but not visible by KUB. Will check CT renal colic next year to assess both stone burden as well as to surveil AML.   Overview:  x 3  Last Assessment & Plan:  Has 54m nonobstructing LUP stone - not visible by KUB.  Will check renal [place] - he will contact office sooner if symptomatic.   . Non-sustained ventricular tachycardia ([place] 03/29/2017  . Other hyperlipidemia 03/29/2017  . Palpitations 10/01/2017  . Paroxysmal atrial fibrillation ([place] 03/29/2017  . Paroxysmal atrial fibrillation ([place] 03/29/2017  . Pleural effusion, bilateral 08/09/2017  . Post-nasal drainage 02/25/2016   Last Assessment & Plan:  Trial zyrtec and flonase  . Prostate cancer screening 06/20/2017   Last Assessment & Plan:  Recommend continued annual CaP screening until within 10 years of life expectancy. Given good health and fhx of longevity, would anticipate CaP screening to continue until age 72  PSA today and again in one year on day of visit.  . Pulmonary hypertension ([place] 08/09/2017  . Pulmonary nodules 06/12/2016  . S/P AVR (aortic valve  replacement) 03/20/2016  . Supratherapeutic INR 07/26/2017  . Syncope 03/29/2017  . Syncope and collapse 02/01/2016  . Typical atrial flutter ([place]) 02/01/2016     Family History  Problem Relation Age of Onset  . Asthma Mother   . Arthritis Mother   . Heart attack Father   . Hypertension Father   . Stroke Paternal Grandmother   . Colon cancer Neg Hx      Social History   Socioeconomic History  . Marital status: Single    Spouse name: Not on file  . Number of children: Not on file  . Years of education: Not on file  . Highest education level: Not on file  Occupational History  . Not on file  Social Needs  .  Financial resource strain: Not on file  . Food insecurity:    Worry: Not on file    Inability: Not on file  . Transportation needs:    Medical: Not on file    Non-medical: Not on file  Tobacco Use  . Smoking status: Former Smoker    Packs/day: 2.00    Years: 34.00    Pack years: 68.00    Types: Cigarettes    Last attempt to quit: 07/16/2000    Years since quitting: 18.0  . Smokeless tobacco: Never Used  Substance and Sexual Activity  . Alcohol use: Yes  . Drug use: No  . Sexual activity: Not on file  Lifestyle  . Physical activity:    Days per week: Not on file    Minutes per session: Not on file  . Stress: Not on file  Relationships  . Social connections:    Talks on phone: Not on file    Gets together: Not on file    Attends religious service: Not on file    Active member of club or organization: Not on file    Attends meetings of clubs or organizations: Not on file    Relationship status: Not on file  . Intimate partner violence:    Fear of current or ex partner: Not on file    Emotionally abused: Not on file    Physically abused: Not on file    Forced sexual activity: Not on file  Other Topics Concern  . Not on file  Social History Narrative  . Not on file  was a Higher education careers adviser, has lived in [place], [place], [place] Was in [place], was in [place], no other inhaled exposures.  Has been exposed to paint fumes in the past  Allergies  Allergen Reactions  . Amoxicillin-Pot Clavulanate Diarrhea    Stomach pain Has patient had a PCN reaction causing immediate rash, facial/tongue/throat swelling, SOB or lightheadedness with hypotension: No Has patient had a PCN reaction causing severe rash involving mucus membranes or skin necrosis: No Has patient had a PCN reaction that required hospitalization: No Has patient had a PCN reaction occurring within the last 10 years: Yes If all of the above answers are "NO", then may proceed with Cephalosporin use.   . Tape Rash and Other (See  Comments)    Surgical tape     Outpatient Medications Prior to Visit  Medication Sig Dispense Refill  . acetaminophen (TYLENOL) 650 MG CR tablet Take 650 mg by mouth every 8 (eight) hours as needed for pain.    [name] aspirin EC 81 MG tablet Take 81 mg by mouth daily.    [name] atorvastatin (LIPITOR) 40 MG tablet Take 1 tablet (40 mg total) by mouth daily at  6 PM. 30 tablet 6  . B Complex-C (B-COMPLEX WITH VITAMIN C) tablet Take 1 tablet by mouth daily.     . cetirizine (ZYRTEC ALLERGY) 10 MG tablet Take 10 mg by mouth daily as needed for allergies.     . Coenzyme Q10-Vitamin E (QUNOL ULTRA COQ10) 100-150 MG-UNIT CAPS Take 1 capsule by mouth every other day.    . Liniments (SALONPAS EX) Apply 1 patch topically daily as needed (pain).     . Macitentan (OPSUMIT) 10 MG TABS Take 1 tablet (10 mg total) by mouth daily. 30 tablet 11  . midodrine (PROAMATINE) 10 MG tablet Take 1 tablet (10 mg total) by mouth 3 (three) times daily with meals. 90 tablet 6  . multivitamin-lutein (OCUVITE-LUTEIN) CAPS capsule Take 1 capsule by mouth daily.    . pantoprazole (PROTONIX) 40 MG tablet Take 1 tablet (40 mg total) by mouth daily. 30 tablet 11  . spironolactone (ALDACTONE) 25 MG tablet Take 0.5 tablets (12.5 mg total) by mouth daily. 45 tablet 3  . tadalafil, PAH, (ADCIRCA) 20 MG tablet Take 2 tablets (40 mg total) by mouth daily. 60 tablet 11  . torsemide (DEMADEX) 20 MG tablet Take 3 tablets (60 mg total) by mouth daily. (Patient taking differently: Take 20 mg by mouth daily. ) 90 tablet 6  . warfarin (COUMADIN) 5 MG tablet As directed by coumadin clinic 50 tablet 0  . potassium chloride SA (K-DUR,KLOR-CON) 20 MEQ tablet Take 2 tablets (40 mEq total) by mouth daily. 60 tablet 3   No facility-administered medications prior to visit.         Objective:   Physical Exam Vitals:   08/05/18 0949  BP: 112/70  Pulse: 65  SpO2: 98%  Weight: 145 lb (65.8 kg)  Height: _0  (1.727 m)   Gen: Pleasant,  well-nourished, in no distress,  normal affect  ENT: No lesions,  mouth clear,  oropharynx clear, no postnasal drip  Neck: No JVD, no stridor  Lungs: No use of accessory muscles, no wheeze or crackles  Cardiovascular: RRR, heart sounds normal, no murmur or gallops, no peripheral edema  Musculoskeletal: No deformities, no cyanosis or clubbing  Neuro: alert, non focal  Skin: Warm, no lesions or rash    Assessment & Plan:  COPD (chronic obstructive pulmonary disease) (HCC) Mixed obstruction and restriction by pulmonary function testing.  He is not currently on bronchodilator therapy.  He is been on Pulmicort at some point in the past but not currently.  Given his good exertional tolerance not clear to me that a schedule bronchodilator is indicated.  I like to try him on albuterol to see if he can benefit with heavier exertion, when he is having dyspnea.  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  I explained to him that our goal will be to keep his oxygen saturations greater than 88% at those times.  He already has a portable oxygen concentrator available.  PAH (pulmonary artery hypertension) ([place]) Secondary pulmonary hypertension principally due to his left-sided heart disease.  He does have underlying obstructive lung disease, mild.  No evidence of ILD by CT chest.  He is anticoagulated.  He had a home sleep study that apparently did not show any evidence for obstructive sleep apnea per his report.  Currently on macitentan and tadalafil, diuretic regimen and anticoagulation.  I will not change this regimen.  Pulmonary nodules He has 2 right lower lobe pulmonary nodules, one is small  one is 9 mm.  Discovered on CT scan of the chest from 05/16/2018.  He needs a repeat CT to look for interval change after 3 months.  We will follow-up to review the results together.  Baltazar Apo, MD, PhD 08/05/2018, 10:45 AM [facility] [phone] or if no answer [phone]

## 2018-08-05 NOTE — Assessment & Plan Note (Signed)
Secondary pulmonary hypertension principally due to his left-sided heart disease.  He does have underlying obstructive lung disease, mild.  No evidence of ILD by CT chest.  He is anticoagulated.  He had a home sleep study that apparently did not show any evidence for obstructive sleep apnea per his report.  Currently on macitentan and tadalafil, diuretic regimen and anticoagulation.  I will not change this regimen.

## 2018-08-05 NOTE — Assessment & Plan Note (Signed)
He has 2 right lower lobe pulmonary nodules, one is small one is 9 mm.  Discovered on CT scan of the chest from 05/16/2018.  He needs a repeat CT to look for interval change after 3 months.  We will follow-up to review the results together.

## 2018-08-06 ENCOUNTER — Ambulatory Visit: Payer: Medicare Other | Attending: Cardiology | Admitting: Physical Therapy

## 2018-08-06 ENCOUNTER — Encounter: Payer: Self-pay | Admitting: Physical Therapy

## 2018-08-06 DIAGNOSIS — M6281 Muscle weakness (generalized): Secondary | ICD-10-CM | POA: Insufficient documentation

## 2018-08-06 DIAGNOSIS — M25611 Stiffness of right shoulder, not elsewhere classified: Secondary | ICD-10-CM | POA: Diagnosis not present

## 2018-08-06 DIAGNOSIS — R29898 Other symptoms and signs involving the musculoskeletal system: Secondary | ICD-10-CM | POA: Diagnosis not present

## 2018-08-06 DIAGNOSIS — M25511 Pain in right shoulder: Secondary | ICD-10-CM | POA: Diagnosis not present

## 2018-08-06 NOTE — Therapy (Signed)
[facility] [address]  [place], [place], [address] Phone: [phone]   Fax:  [phone]  Physical Therapy Treatment  Patient Details  Name: Lance Hicks MRN: [id number] Date of Birth: 1946-08-08 Referring Provider: Dr. Stann Mainland   Encounter Date: 08/06/2018  PT End of Session - 08/06/18 1537    Visit Number  12    Number of Visits  15    Date for PT Re-Evaluation  08/21/18    Authorization Type  Medicare [place] [employer]    PT Start Time  1402    PT Stop Time  1453   ice pack   PT Time Calculation (min)  51 min    Activity Tolerance  Patient tolerated treatment well;Patient limited by pain    Behavior During Therapy  [facility] for tasks assessed/performed       Past Medical History:  Diagnosis Date  . 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 In light of concurrent left nephrolithiasis, will check CT renal colic next year instead of renal US.   [name] Anticoagulated on Coumadin 01/04/2018  . Anxiety 05/10/2016   Last Assessment & Plan:  Doing well off of zoloft.  . Asymptomatic microscopic hematuria 06/20/2017   Last Assessment & Plan:  Had hematuria workup in [facility] in 2016 which negative CT and cystoscopy. UA with 2+ blood last visit - we discussed recommendation for repeat workup at 5 years or if degree of hematuria progresses.   . Atrial fibrillation ([place]) 03/29/2017  . Atrial flutter ([place]) 03/29/2017  . Chronic allergic rhinitis 04/28/2016   Last Assessment & Plan:  Continue astelin  . Chronic anticoagulation 03/29/2017  . Chronic atrial fibrillation ([place]) 07/23/2015   Last Assessment & Plan:  Coumadin and metoprolol, cardiology referral to establish care.  . Chronic midline back pain 12/14/2015   Last Assessment & Plan:  Pain management referral for further evaluation.  . Chronic prostatitis 07/23/2015   Last Assessment & Plan:  Has largely resolved since stopping  bike riding. Recommend annual DRE AND PSA - will see back 12/2015 for annual screening, given 1st degree fhx. To call office for recurrent prostatitis symptoms.   [name] COPD (chronic obstructive pulmonary disease) ([place]) 06/12/2016  . Coronary artery disease involving native coronary artery of native heart with angina pectoris ([place]) 03/29/2017  . Cough 10/17/2016   Last Assessment & Plan:  Discussed typical course for acute viral illness. If symptoms worsen or fail to improve by 7-10d, delayed ATBs, fluids, rest, NSAIDs/APAP prn. Seek care if not improving. Needs earlier INR check due to ATBs.  [name] Dyspnea 02/01/2016   Last Assessment & Plan:  Overall improving, eval by pulm, plan for CT, neg stress test with cardiology. Recent switch to carvedilol due to side effects.  . Epidermoid cyst of skin 08/24/2017  . Essential hypertension 12/14/2015   Last Assessment & Plan:  Hypertension control: controlled  Medications: compliant Medication Management: as noted in orders Home blood pressure monitoring recommended additionally as needed for symptoms  The patient's care plan was reviewed and updated. Instructions and counseling were provided regarding patient goals and barriers. He was counseled to adopt a healthy lifestyle. Educational resources and self-management tools have been provided as charted in [facility] list.   . H/O maze procedure 03/29/2017  . [name] 03/29/2017   Overview:  2011  . Hx of CABG 03/29/2017  . Hyperlipidemia 03/29/2017  .  Hypertensive heart disease 03/29/2017  . Kidney stones 07/23/2015   Overview:  x 3  Last Assessment & Plan:  By [place] has left nephrolithiasis, but not visible by KUB. Will check CT renal colic next year to assess both stone burden as well as to surveil AML.   . Leukocytoclastic vasculitis ([place]) 10/01/2017  . Localized edema 01/04/2018  . Lumbar radicular pain 01/19/2016  . Maculopapular rash 09/03/2017  . Nephrolithiasis 07/23/2015   Overview:  x 3  Last  Assessment & Plan:  By [place] has left nephrolithiasis, but not visible by KUB. Will check CT renal colic next year to assess both stone burden as well as to surveil AML.   Overview:  x 3  Last Assessment & Plan:  Has 66m nonobstructing LUP stone - not visible by KUB.  Will check renal [place] - he will contact office sooner if symptomatic.   . Non-sustained ventricular tachycardia ([place] 03/29/2017  . Other hyperlipidemia 03/29/2017  . Palpitations 10/01/2017  . Paroxysmal atrial fibrillation ([place] 03/29/2017  . Paroxysmal atrial fibrillation ([place] 03/29/2017  . Pleural effusion, bilateral 08/09/2017  . Post-nasal drainage 02/25/2016   Last Assessment & Plan:  Trial zyrtec and flonase  . Prostate cancer screening 06/20/2017   Last Assessment & Plan:  Recommend continued annual CaP screening until within 10 years of life expectancy. Given good health and fhx of longevity, would anticipate CaP screening to continue until age 72  PSA today and again in one year on day of visit.  . Pulmonary hypertension ([place] 08/09/2017  . Pulmonary nodules 06/12/2016  . S/P AVR (aortic valve replacement) 03/20/2016  . Supratherapeutic INR 07/26/2017  . Syncope 03/29/2017  . Syncope and collapse 02/01/2016  . Typical atrial flutter ([place] 02/01/2016    Past Surgical History:  Procedure Laterality Date  . CHOLECYSTECTOMY    . CORONARY ARTERY BYPASS GRAFT    . EXPLORATORY LAPAROTOMY  07/30/2017  . FOOT SURGERY    . FRACTURE SURGERY Right    wrist and forearm  . HERNIA REPAIR    . MECHANICAL AORTIC VALVE REPLACEMENT    . NASAL SINUS SURGERY    . RIGHT HEART CATH N/A 01/18/2018   Procedure: RIGHT HEART CATH;  Surgeon: BJolaine Artist MD;  [facility];  Service: Cardiovascular;  Laterality: N/A;  . RIGHT HEART CATH N/A 05/14/2018   Procedure: RIGHT HEART CATH;  Surgeon: BJolaine Artist MD;  [facility];  Service: Cardiovascular;  Laterality: N/A;  . TEE WITHOUT CARDIOVERSION N/A 05/21/2018    Procedure: TRANSESOPHAGEAL ECHOCARDIOGRAM (TEE);  Surgeon: BJolaine Artist MD;  [facility];  Service: Cardiovascular;  Laterality: N/A;  . UPPER GASTROINTESTINAL ENDOSCOPY  07/12/2017   Patchy areas of mucosal inflammation noted in the antrum with edema,erthema and ulcerations. Bx. Chronicfocally active gastritis.  .Marland KitchenVASECTOMY      There were no vitals filed for this visit.  Subjective Assessment - 08/06/18 1404    Subjective  Reports R shoulder has been more stiff lately even though he has been doing things. Reports he is back to doing most things but is not able to shoot weapons. Reports he think he over-did it with the exercises.    Pertinent History  a fib, a flutter, chronic anticoagulation, chronic back pain, COPD, CAD, dyspnea, HTN, aortic valve replacement 2018, CABG 2018, HLD, kidney stones, leukocytoclastic vasculitis, pleural effusion, syncope, wrist and forearm fx surgery    Diagnostic tests  R shoulder xray 05/17/18: No  acute fracture or dislocation; Osteoarthritic change noted in the right glenohumeral joint. R shoulder MRI: 05/26: Full-thickness tear of the vast majority of the supraspinatus tendon; Mild distal infraspinatus partial-thickness articular surface tearing. Mild subscapularis tendinopathy. Poor visualization of the intra-articular segment of the long head of the biceps compatible with at least partial tearing.    Currently in Pain?  No/denies                       [facility] Adult PT Treatment/Exercise - 08/06/18 0001      Exercises   Exercises  Shoulder      Shoulder Exercises: Prone   Other Prone Exercises  prone 2x10 B UE T with 1#; R I with 2#; over green pballl      Shoulder Exercises: Sidelying   External Rotation  Right;10 reps;Strengthening   VC/TCs to go to neutral   External Rotation Weight (lbs)  1    External Rotation Limitations  2x10    ABduction  Right;10 reps;Limitations;Weights    ABduction Weight (lbs)  1     ABduction Limitations  thumb up      Shoulder Exercises: Standing   Horizontal ABduction  Strengthening;Both;10 reps;Theraband;Limitations    Theraband Level (Shoulder Horizontal ABduction)  Level 2 (Red)    Horizontal ABduction Limitations  cues for scap retraction    External Rotation  10 reps;Theraband;Strengthening;Both    Theraband Level (Shoulder External Rotation)  Level 2 (Red)    External Rotation Limitations  2x10; VCs to keep elbows in    Other Standing Exercises  R biceps curl with red TB (supinated, neutral, pronated) 10 reps each      Shoulder Exercises: ROM/Strengthening   UBE (Upper Arm Bike)  L 1 3/3    Cybex Row  15 reps   cues for scap retraction   Cybex Row Limitations  25      Cryotherapy   Number Minutes Cryotherapy  10 Minutes    Cryotherapy Location  Shoulder    Type of Cryotherapy  Ice pack      Manual Therapy   Manual Therapy  Soft tissue mobilization;Passive ROM    Soft tissue mobilization  R bicep tendon, posterior deltoid, bicep muscle belly- TTP at biceps tendon    Passive ROM  R shoulder PROM in all planes to tolerance                PT Short Term Goals - 07/24/18 1404      PT SHORT TERM GOAL #1   Title  Patient to be independent with inital HEP.    Time  4    Period  Weeks    Status  Achieved        PT Long Term Goals - 07/24/18 1404      PT LONG TERM GOAL #1   Title  Patient to be independent with advanced HEP.    Time  4    Period  Weeks    Status  Partially Met   Met for current    Target Date  08/21/18      PT LONG TERM GOAL #2   Title  Patient to demonstrate R shoulder AROM/PROM [facility] without pain limiting.     Time  4    Period  Weeks    Status  On-going   improvements made in R shoulder AROM flexion and abduction, and all PROM   Target Date  08/21/18      PT LONG TERM GOAL #  3   Title  Patient to demonstrate >=4+/5 R UE strength.    Time  8    Period  Weeks    Status  Partially Met   most limited in R shoulder  ER, 4-/5   Target Date  08/21/18      PT LONG TERM GOAL #4   Title  Patient to demonstrate lifting 15# box overhead with <=1/10 pain.    Time  4    Period  Weeks    Status  Partially Met   Able to lift 10# to shoulder height and above without pain    Target Date  08/21/18            Plan - 08/06/18 1538    Clinical Impression Statement  Patient arrived to session with report of R shoulder stiffness. Also reporting that he discontinued HEP banded resistance exercises d/t "over-doing it" citing that he may have been performing them with poor form. R shoulder visibly and palpably edematous at beginning of session. Corrected form with sidelying ER- advising patient to avoid stretching past neutral. Patient reported understanding. Tolerated STM to R bicep tendon, posterior deltoid, bicep muscle belly- TTP at biceps tendon this date but tolerable. Patient also with sharp pain with gentle palpation of R infraspinatus. Good tolerance of R shoulder PROM this date. Focused session of periscapular strengthening with cues required intermittently to correct form at patient tends to perform exercises to absolute end range with lack of control. Ended session with gentle resisted elbow flexion with elbow in supination, neutral, pronation- patient reporting no pain. Ended session with ice pack to R shoulder for edema. Normal integumentary response noted.     PT Treatment/Interventions  ADLs/Self Care Home Management;Electrical Stimulation;Cryotherapy;Moist Heat;Therapeutic exercise;Therapeutic activities;Functional mobility training;Ultrasound;Patient/family education;Manual techniques;Vasopneumatic Device;Taping;Splinting;Energy conservation;Dry needling;Passive range of motion    Consulted and Agree with Plan of Care  Patient       Patient will benefit from skilled therapeutic intervention in order to improve the following deficits and impairments:  Hypomobility, Decreased activity tolerance, Decreased  strength, Pain, Impaired UE functional use, Decreased range of motion, Impaired flexibility, Postural dysfunction  Visit Diagnosis: Acute pain of right shoulder  Stiffness of right shoulder, not elsewhere classified  Muscle weakness (generalized)  Other symptoms and signs involving the musculoskeletal system     Problem List Patient Active Problem List   Diagnosis Date Noted  . Hypotension, chronic 06/06/2018  . Enterococcal bacteremia   . Pulmonary edema   . Shoulder pain   . PAH (pulmonary artery hypertension) ([place]) 02/20/2018  . Hypokalemia 02/18/2018  . Anticoagulated on Coumadin 01/04/2018  . Localized edema 01/04/2018  . Leukocytoclastic vasculitis ([place]) 10/01/2017  . Palpitations 10/01/2017  . Maculopapular rash 09/03/2017  . Epidermoid cyst of skin 08/24/2017  . Pleural effusion, bilateral 08/09/2017  . Pulmonary hypertension ([place]) 08/09/2017  . Supratherapeutic INR 07/26/2017  . Asymptomatic microscopic hematuria 06/20/2017  . Prostate cancer screening 06/20/2017  . Atrial flutter ([place]) 03/29/2017  . Chronic anticoagulation 03/29/2017  . Coronary artery disease involving native coronary artery of native heart with angina pectoris ([place]) 03/29/2017  . H/O maze procedure 03/29/2017  . [name] 03/29/2017  . Hx of CABG 03/29/2017  . Hypertensive heart disease with heart failure ([place]) 03/29/2017  . Non-sustained ventricular tachycardia ([place]) 03/29/2017  . Hyperlipidemia 03/29/2017  . Syncope 03/29/2017  . Cough 10/17/2016  . COPD (chronic obstructive pulmonary disease) ([place]) 06/12/2016  . Pulmonary nodules 06/12/2016  . Anxiety 05/10/2016  .  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 Chronic allergic rhinitis 04/28/2016  . History of prosthetic aortic valve 03/20/2016  . Post-nasal drainage 02/25/2016  . Dyspnea 02/01/2016  . Syncope and collapse 02/01/2016  . Typical atrial flutter ([place]) 02/01/2016  . Lumbar radicular pain  01/19/2016  . Chronic midline back pain 12/14/2015  . Essential hypertension 12/14/2015  . Chronic prostatitis 07/23/2015  . Nephrolithiasis 07/23/2015    Janene Harvey, PT, DPT 08/06/18 3:46 PM   [facility] [address]  [place], [place], [address] Phone: [phone]   Fax:  [phone]  Name: Lance Hicks MRN: [id number] Date of Birth: [birth year]

## 2018-08-07 ENCOUNTER — Ambulatory Visit (INDEPENDENT_AMBULATORY_CARE_PROVIDER_SITE_OTHER): Payer: Medicare Other

## 2018-08-07 DIAGNOSIS — Z7901 Long term (current) use of anticoagulants: Secondary | ICD-10-CM | POA: Diagnosis not present

## 2018-08-07 DIAGNOSIS — I4892 Unspecified atrial flutter: Secondary | ICD-10-CM

## 2018-08-07 LAB — POCT INR: INR: 2.6 (ref 2.0–3.0)

## 2018-08-07 NOTE — Patient Instructions (Signed)
Description   Continue dose of 2.5 mg daily except for 5 mg on Friday's. Please call our office with any medication changes or concerns [phone]. Return in 10 days for INR check.

## 2018-08-12 ENCOUNTER — Ambulatory Visit: Payer: Medicare Other | Admitting: Physical Therapy

## 2018-08-12 ENCOUNTER — Encounter: Payer: Self-pay | Admitting: Physical Therapy

## 2018-08-12 DIAGNOSIS — R29898 Other symptoms and signs involving the musculoskeletal system: Secondary | ICD-10-CM | POA: Diagnosis not present

## 2018-08-12 DIAGNOSIS — M25511 Pain in right shoulder: Secondary | ICD-10-CM | POA: Diagnosis not present

## 2018-08-12 DIAGNOSIS — M6281 Muscle weakness (generalized): Secondary | ICD-10-CM | POA: Diagnosis not present

## 2018-08-12 DIAGNOSIS — M25611 Stiffness of right shoulder, not elsewhere classified: Secondary | ICD-10-CM | POA: Diagnosis not present

## 2018-08-12 NOTE — Therapy (Signed)
[facility] [address]  [place], [place], [address] Phone: [phone]   Fax:  [phone]  Physical Therapy Treatment  Patient Details  Name: Lance Hicks MRN: [id number] Date of Birth: [birth year] Referring Provider: Dr. Stann Mainland   Encounter Date: 08/12/2018  PT End of Session - 08/12/18 1610    Visit Number  13    Number of Visits  15    Date for PT Re-Evaluation  08/21/18    Authorization Type  Medicare [place] [employer]    PT Start Time  1311    PT Stop Time  1358    PT Time Calculation (min)  47 min    Activity Tolerance  Patient tolerated treatment well    Behavior During Therapy  [facility] for tasks assessed/performed       Past Medical History:  Diagnosis Date  . 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 In light of concurrent left nephrolithiasis, will check CT renal colic next year instead of renal US.   [name] Anticoagulated on Coumadin 01/04/2018  . Anxiety 05/10/2016   Last Assessment & Plan:  Doing well off of zoloft.  . Asymptomatic microscopic hematuria 06/20/2017   Last Assessment & Plan:  Had hematuria workup in [facility] in 2016 which negative CT and cystoscopy. UA with 2+ blood last visit - we discussed recommendation for repeat workup at 5 years or if degree of hematuria progresses.   . Atrial fibrillation ([place]) 03/29/2017  . Atrial flutter ([place]) 03/29/2017  . Chronic allergic rhinitis 04/28/2016   Last Assessment & Plan:  Continue astelin  . Chronic anticoagulation 03/29/2017  . Chronic atrial fibrillation ([place]) 07/23/2015   Last Assessment & Plan:  Coumadin and metoprolol, cardiology referral to establish care.  . Chronic midline back pain 12/14/2015   Last Assessment & Plan:  Pain management referral for further evaluation.  . Chronic prostatitis 07/23/2015   Last Assessment & Plan:  Has largely resolved since stopping bike riding. Recommend annual DRE AND  PSA - will see back 12/2015 for annual screening, given 1st degree fhx. To call office for recurrent prostatitis symptoms.   [name] COPD (chronic obstructive pulmonary disease) ([place]) 06/12/2016  . Coronary artery disease involving native coronary artery of native heart with angina pectoris ([place]) 03/29/2017  . Cough 10/17/2016   Last Assessment & Plan:  Discussed typical course for acute viral illness. If symptoms worsen or fail to improve by 7-10d, delayed ATBs, fluids, rest, NSAIDs/APAP prn. Seek care if not improving. Needs earlier INR check due to ATBs.  [name] Dyspnea 02/01/2016   Last Assessment & Plan:  Overall improving, eval by pulm, plan for CT, neg stress test with cardiology. Recent switch to carvedilol due to side effects.  . Epidermoid cyst of skin 08/24/2017  . Essential hypertension 12/14/2015   Last Assessment & Plan:  Hypertension control: controlled  Medications: compliant Medication Management: as noted in orders Home blood pressure monitoring recommended additionally as needed for symptoms  The patient's care plan was reviewed and updated. Instructions and counseling were provided regarding patient goals and barriers. He was counseled to adopt a healthy lifestyle. Educational resources and self-management tools have been provided as charted in [facility] list.   . H/O maze procedure 03/29/2017  . [name] 03/29/2017   Overview:  2011  . Hx of CABG 03/29/2017  . Hyperlipidemia 03/29/2017  . Hypertensive heart disease 03/29/2017  .  Kidney stones 07/23/2015   Overview:  x 3  Last Assessment & Plan:  By [place] has left nephrolithiasis, but not visible by KUB. Will check CT renal colic next year to assess both stone burden as well as to surveil AML.   . Leukocytoclastic vasculitis ([place]) 10/01/2017  . Localized edema 01/04/2018  . Lumbar radicular pain 01/19/2016  . Maculopapular rash 09/03/2017  . Nephrolithiasis 07/23/2015   Overview:  x 3  Last Assessment & Plan:  By [place] has left  nephrolithiasis, but not visible by KUB. Will check CT renal colic next year to assess both stone burden as well as to surveil AML.   Overview:  x 3  Last Assessment & Plan:  Has 36m nonobstructing LUP stone - not visible by KUB.  Will check renal [place] - he will contact office sooner if symptomatic.   . Non-sustained ventricular tachycardia ([place] 03/29/2017  . Other hyperlipidemia 03/29/2017  . Palpitations 10/01/2017  . Paroxysmal atrial fibrillation ([place] 03/29/2017  . Paroxysmal atrial fibrillation ([place] 03/29/2017  . Pleural effusion, bilateral 08/09/2017  . Post-nasal drainage 02/25/2016   Last Assessment & Plan:  Trial zyrtec and flonase  . Prostate cancer screening 06/20/2017   Last Assessment & Plan:  Recommend continued annual CaP screening until within 10 years of life expectancy. Given good health and fhx of longevity, would anticipate CaP screening to continue until age 72  PSA today and again in one year on day of visit.  . Pulmonary hypertension ([place] 08/09/2017  . Pulmonary nodules 06/12/2016  . S/P AVR (aortic valve replacement) 03/20/2016  . Supratherapeutic INR 07/26/2017  . Syncope 03/29/2017  . Syncope and collapse 02/01/2016  . Typical atrial flutter ([place] 02/01/2016    Past Surgical History:  Procedure Laterality Date  . CHOLECYSTECTOMY    . CORONARY ARTERY BYPASS GRAFT    . EXPLORATORY LAPAROTOMY  07/30/2017  . FOOT SURGERY    . FRACTURE SURGERY Right    wrist and forearm  . HERNIA REPAIR    . MECHANICAL AORTIC VALVE REPLACEMENT    . NASAL SINUS SURGERY    . RIGHT HEART CATH N/A 01/18/2018   Procedure: RIGHT HEART CATH;  Surgeon: BJolaine Artist MD;  [facility];  Service: Cardiovascular;  Laterality: N/A;  . RIGHT HEART CATH N/A 05/14/2018   Procedure: RIGHT HEART CATH;  Surgeon: BJolaine Artist MD;  [facility];  Service: Cardiovascular;  Laterality: N/A;  . TEE WITHOUT CARDIOVERSION N/A 05/21/2018   Procedure: TRANSESOPHAGEAL  ECHOCARDIOGRAM (TEE);  Surgeon: BJolaine Artist MD;  [facility];  Service: Cardiovascular;  Laterality: N/A;  . UPPER GASTROINTESTINAL ENDOSCOPY  07/12/2017   Patchy areas of mucosal inflammation noted in the antrum with edema,erthema and ulcerations. Bx. Chronicfocally active gastritis.  .Marland KitchenVASECTOMY      There were no vitals filed for this visit.  Subjective Assessment - 08/12/18 1313    Subjective  Patient reports R shoulder doing better but still having shoulder pain on top. Reports pulling trigger of weed eater really hurts his shoulder. Bandaid on L arm- had bad bruise and put needle in it to pop it.     Pertinent History  a fib, a flutter, chronic anticoagulation, chronic back pain, COPD, CAD, dyspnea, HTN, aortic valve replacement 2018, CABG 2018, HLD, kidney stones, leukocytoclastic vasculitis, pleural effusion, syncope, wrist and forearm fx surgery    Diagnostic tests  R shoulder xray 05/17/18: No acute fracture or dislocation; Osteoarthritic change  noted in the right glenohumeral joint. R shoulder MRI: 05/26: Full-thickness tear of the vast majority of the supraspinatus tendon; Mild distal infraspinatus partial-thickness articular surface tearing. Mild subscapularis tendinopathy. Poor visualization of the intra-articular segment of the long head of the biceps compatible with at least partial tearing.    Currently in Pain?  No/denies                       [facility] Adult PT Treatment/Exercise - 08/12/18 0001      Shoulder Exercises: Supine   Protraction  Both;Strengthening;15 reps;Weights;Limitations   heavy cues for form and speed   Protraction Weight (lbs)  4    Other Supine Exercises  rhythmic stabilization with B UEs at 90 degrees of flexion and blue med ball 2x30"      Shoulder Exercises: Sidelying   External Rotation  Right;10 reps;Strengthening    External Rotation Weight (lbs)  2    External Rotation Limitations  2x10; dowel under elbow     ABduction  Right;10 reps;Limitations;Weights    ABduction Weight (lbs)  1    ABduction Limitations  thumb up      Shoulder Exercises: Standing   External Rotation  Right;Both;10 reps;Theraband;Limitations    Theraband Level (Shoulder External Rotation)  Level 2 (Red)    External Rotation Limitations  B row + ER; 2x10    Internal Rotation  Right;10 reps;Theraband    Theraband Level (Shoulder Internal Rotation)  Level 3 (Green)    Internal Rotation Limitations  dowel under elbow; VCs to maintain neutral and slow eccentric release    Other Standing Exercises  R biceps curl with red TB (supinated, neutral, pronated) 10 reps each   cues to stand up tall   Other Standing Exercises  R tricep pulldown with red TB x10      Shoulder Exercises: ROM/Strengthening   UBE (Upper Arm Bike)  L 1.5 x 3/3    Cybex Row  10 reps   cues for scap retraction   Cybex Row Limitations  25#; 10x wide, 10x narrow      Manual Therapy   Manual Therapy  Taping      Kinesiotix   Create Space  R shoulder impingement taping pattern              PT Education - 08/12/18 1610    Education Details  consolidated HEP, edu on kinesiotape precautions    Person(s) Educated  Patient    Methods  Explanation;Demonstration;Tactile cues;Verbal cues;Handout    Comprehension  Returned demonstration;Verbalized understanding       PT Short Term Goals - 07/24/18 1404      PT SHORT TERM GOAL #1   Title  Patient to be independent with inital HEP.    Time  4    Period  Weeks    Status  Achieved        PT Long Term Goals - 07/24/18 1404      PT LONG TERM GOAL #1   Title  Patient to be independent with advanced HEP.    Time  4    Period  Weeks    Status  Partially Met   Met for current    Target Date  08/21/18      PT LONG TERM GOAL #2   Title  Patient to demonstrate R shoulder AROM/PROM [facility] without pain limiting.     Time  4    Period  Weeks    Status  On-going  improvements made in R shoulder AROM  flexion and abduction, and all PROM   Target Date  08/21/18      PT LONG TERM GOAL #3   Title  Patient to demonstrate >=4+/5 R UE strength.    Time  8    Period  Weeks    Status  Partially Met   most limited in R shoulder ER, 4-/5   Target Date  08/21/18      PT LONG TERM GOAL #4   Title  Patient to demonstrate lifting 15# box overhead with <=1/10 pain.    Time  4    Period  Weeks    Status  Partially Met   Able to lift 10# to shoulder height and above without pain    Target Date  08/21/18            Plan - 08/12/18 1611    Clinical Impression Statement  Patient arrived to session with no new complaints. Reports he gave his R shoulder a break over the last week and it felt better. Able to perform progressive R shoulder ER with increased weight this session with good form. Required cues for upright posture during standing exercises as patient tends to stand with kyphotic and forward head posture throughout. Reviewed consolidated HEP for patient as he will be ready for D/C next session. Questions answered and patient reported understanding. Patient reported relief from kinesiotape in past visits- received taping to R shoulder at end of session. Patient reported relief at end of session.     PT Treatment/Interventions  ADLs/Self Care Home Management;Electrical Stimulation;Cryotherapy;Moist Heat;Therapeutic exercise;Therapeutic activities;Functional mobility training;Ultrasound;Patient/family education;Manual techniques;Vasopneumatic Device;Taping;Splinting;Energy conservation;Dry needling;Passive range of motion    PT Next Visit Plan  d/c next session    Consulted and Agree with Plan of Care  Patient       Patient will benefit from skilled therapeutic intervention in order to improve the following deficits and impairments:  Hypomobility, Decreased activity tolerance, Decreased strength, Pain, Impaired UE functional use, Decreased range of motion, Impaired flexibility, Postural  dysfunction  Visit Diagnosis: Acute pain of right shoulder  Stiffness of right shoulder, not elsewhere classified  Muscle weakness (generalized)  Other symptoms and signs involving the musculoskeletal system     Problem List Patient Active Problem List   Diagnosis Date Noted  . Hypotension, chronic 06/06/2018  . Enterococcal bacteremia   . Pulmonary edema   . Shoulder pain   . PAH (pulmonary artery hypertension) ([place]) 02/20/2018  . Hypokalemia 02/18/2018  . Anticoagulated on Coumadin 01/04/2018  . Localized edema 01/04/2018  . Leukocytoclastic vasculitis ([place]) 10/01/2017  . Palpitations 10/01/2017  . Maculopapular rash 09/03/2017  . Epidermoid cyst of skin 08/24/2017  . Pleural effusion, bilateral 08/09/2017  . Pulmonary hypertension ([place]) 08/09/2017  . Supratherapeutic INR 07/26/2017  . Asymptomatic microscopic hematuria 06/20/2017  . Prostate cancer screening 06/20/2017  . Atrial flutter ([place]) 03/29/2017  . Chronic anticoagulation 03/29/2017  . Coronary artery disease involving native coronary artery of native heart with angina pectoris ([place]) 03/29/2017  . H/O maze procedure 03/29/2017  . [name] 03/29/2017  . Hx of CABG 03/29/2017  . Hypertensive heart disease with heart failure ([place]) 03/29/2017  . Non-sustained ventricular tachycardia ([place]) 03/29/2017  . Hyperlipidemia 03/29/2017  . Syncope 03/29/2017  . Cough 10/17/2016  . COPD (chronic obstructive pulmonary disease) ([place]) 06/12/2016  . Pulmonary nodules 06/12/2016  . Anxiety 05/10/2016  . 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 Chronic allergic rhinitis 04/28/2016  .  History of prosthetic aortic valve 03/20/2016  . Post-nasal drainage 02/25/2016  . Dyspnea 02/01/2016  . Syncope and collapse 02/01/2016  . Typical atrial flutter ([place]) 02/01/2016  . Lumbar radicular pain 01/19/2016  . Chronic midline back pain 12/14/2015  . Essential hypertension 12/14/2015  . Chronic  prostatitis 07/23/2015  . Nephrolithiasis 07/23/2015    Janene Harvey, PT, DPT 08/12/18 4:12 PM   [facility] [address]  [place], [place], [address] Phone: [phone]   Fax:  [phone]  Name: Lance Hicks MRN: [id number] Date of Birth: August 25, 1946

## 2018-08-19 ENCOUNTER — Encounter: Payer: Self-pay | Admitting: Physical Therapy

## 2018-08-19 ENCOUNTER — Ambulatory Visit: Payer: Medicare Other | Admitting: Physical Therapy

## 2018-08-19 DIAGNOSIS — M25511 Pain in right shoulder: Secondary | ICD-10-CM

## 2018-08-19 DIAGNOSIS — R29898 Other symptoms and signs involving the musculoskeletal system: Secondary | ICD-10-CM

## 2018-08-19 DIAGNOSIS — M25611 Stiffness of right shoulder, not elsewhere classified: Secondary | ICD-10-CM | POA: Diagnosis not present

## 2018-08-19 DIAGNOSIS — M6281 Muscle weakness (generalized): Secondary | ICD-10-CM

## 2018-08-19 NOTE — Therapy (Addendum)
[facility] [address]  [place], [place], [address] Phone: [phone]   Fax:  [phone]  Physical Therapy Treatment  Patient Details  Name: Lance Hicks MRN: [id number] Date of Birth: Dec 03, 1946 Referring Provider: Dr. Stann Mainland   Progress Note Reporting Period 07/24/18 to 08/19/18  See note below for Objective Data and Assessment of Progress/Goals.    Encounter Date: 08/19/2018  PT End of Session - 08/19/18 1816    Visit Number  14    Number of Visits  15    Date for PT Re-Evaluation  08/21/18    Authorization Type  Medicare [employer] Plan F    PT Start Time  1400    PT Stop Time  1445    PT Time Calculation (min)  45 min    Activity Tolerance  Patient tolerated treatment well    Behavior During Therapy  WFL for tasks assessed/performed       Past Medical History:  Diagnosis Date  . 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 In light of concurrent left nephrolithiasis, will check CT renal colic next year instead of renal US.   [name] Anticoagulated on Coumadin 01/04/2018  . Anxiety 05/10/2016   Last Assessment & Plan:  Doing well off of zoloft.  . Asymptomatic microscopic hematuria 06/20/2017   Last Assessment & Plan:  Had hematuria workup in [facility] in 2016 which negative CT and cystoscopy. UA with 2+ blood last visit - we discussed recommendation for repeat workup at 5 years or if degree of hematuria progresses.   . Atrial fibrillation ([place]) 03/29/2017  . Atrial flutter ([place]) 03/29/2017  . Chronic allergic rhinitis 04/28/2016   Last Assessment & Plan:  Continue astelin  . Chronic anticoagulation 03/29/2017  . Chronic atrial fibrillation ([place]) 07/23/2015   Last Assessment & Plan:  Coumadin and metoprolol, cardiology referral to establish care.  . Chronic midline back pain 12/14/2015   Last Assessment & Plan:  Pain management referral for further evaluation.  .  Chronic prostatitis 07/23/2015   Last Assessment & Plan:  Has largely resolved since stopping bike riding. Recommend annual DRE AND PSA - will see back 12/2015 for annual screening, given 1st degree fhx. To call office for recurrent prostatitis symptoms.   [name] COPD (chronic obstructive pulmonary disease) ([place]) 06/12/2016  . Coronary artery disease involving native coronary artery of native heart with angina pectoris ([place]) 03/29/2017  . Cough 10/17/2016   Last Assessment & Plan:  Discussed typical course for acute viral illness. If symptoms worsen or fail to improve by 7-10d, delayed ATBs, fluids, rest, NSAIDs/APAP prn. Seek care if not improving. Needs earlier INR check due to ATBs.  [name] Dyspnea 02/01/2016   Last Assessment & Plan:  Overall improving, eval by pulm, plan for CT, neg stress test with cardiology. Recent switch to carvedilol due to side effects.  . Epidermoid cyst of skin 08/24/2017  . Essential hypertension 12/14/2015   Last Assessment & Plan:  Hypertension control: controlled  Medications: compliant Medication Management: as noted in orders Home blood pressure monitoring recommended additionally as needed for symptoms  The patient's care plan was reviewed and updated. Instructions and counseling were provided regarding patient goals and barriers. He was counseled to adopt a healthy lifestyle. Educational resources and self-management tools have been provided as charted in [facility] list.   . H/O maze procedure 03/29/2017  . [name] 03/29/2017  Overview:  2011  . Hx of CABG 03/29/2017  . Hyperlipidemia 03/29/2017  . Hypertensive heart disease 03/29/2017  . Kidney stones 07/23/2015   Overview:  x 3  Last Assessment & Plan:  By [place] has left nephrolithiasis, but not visible by KUB. Will check CT renal colic next year to assess both stone burden as well as to surveil AML.   . Leukocytoclastic vasculitis ([place]) 10/01/2017  . Localized edema 01/04/2018  . Lumbar radicular pain 01/19/2016   . Maculopapular rash 09/03/2017  . Nephrolithiasis 07/23/2015   Overview:  x 3  Last Assessment & Plan:  By [place] has left nephrolithiasis, but not visible by KUB. Will check CT renal colic next year to assess both stone burden as well as to surveil AML.   Overview:  x 3  Last Assessment & Plan:  Has 48m nonobstructing LUP stone - not visible by KUB.  Will check renal [place] - he will contact office sooner if symptomatic.   . Non-sustained ventricular tachycardia ([place] 03/29/2017  . Other hyperlipidemia 03/29/2017  . Palpitations 10/01/2017  . Paroxysmal atrial fibrillation ([place] 03/29/2017  . Paroxysmal atrial fibrillation ([place] 03/29/2017  . Pleural effusion, bilateral 08/09/2017  . Post-nasal drainage 02/25/2016   Last Assessment & Plan:  Trial zyrtec and flonase  . Prostate cancer screening 06/20/2017   Last Assessment & Plan:  Recommend continued annual CaP screening until within 10 years of life expectancy. Given good health and fhx of longevity, would anticipate CaP screening to continue until age 72  PSA today and again in one year on day of visit.  . Pulmonary hypertension ([place] 08/09/2017  . Pulmonary nodules 06/12/2016  . S/P AVR (aortic valve replacement) 03/20/2016  . Supratherapeutic INR 07/26/2017  . Syncope 03/29/2017  . Syncope and collapse 02/01/2016  . Typical atrial flutter ([place] 02/01/2016    Past Surgical History:  Procedure Laterality Date  . CHOLECYSTECTOMY    . CORONARY ARTERY BYPASS GRAFT    . EXPLORATORY LAPAROTOMY  07/30/2017  . FOOT SURGERY    . FRACTURE SURGERY Right    wrist and forearm  . HERNIA REPAIR    . MECHANICAL AORTIC VALVE REPLACEMENT    . NASAL SINUS SURGERY    . RIGHT HEART CATH N/A 01/18/2018   Procedure: RIGHT HEART CATH;  Surgeon: BJolaine Artist MD;  Location: [facility];  Service: Cardiovascular;  Laterality: N/A;  . RIGHT HEART CATH N/A 05/14/2018   Procedure: RIGHT HEART CATH;  Surgeon: BJolaine Artist MD;  [facility];   Service: Cardiovascular;  Laterality: N/A;  . TEE WITHOUT CARDIOVERSION N/A 05/21/2018   Procedure: TRANSESOPHAGEAL ECHOCARDIOGRAM (TEE);  Surgeon: BJolaine Artist MD;  [facility];  Service: Cardiovascular;  Laterality: N/A;  . UPPER GASTROINTESTINAL ENDOSCOPY  07/12/2017   Patchy areas of mucosal inflammation noted in the antrum with edema,erthema and ulcerations. Bx. Chronicfocally active gastritis.  .Marland KitchenVASECTOMY      There were no vitals filed for this visit.  Subjective Assessment - 08/19/18 1400    Subjective  Reports he has been a little better since last session. Reports 95% improvement since initial eval. Reports lifting overhead is still difficult.    Pertinent History  a fib, a flutter, chronic anticoagulation, chronic back pain, COPD, CAD, dyspnea, HTN, aortic valve replacement 2018, CABG 2018, HLD, kidney stones, leukocytoclastic vasculitis, pleural effusion, syncope, wrist and forearm fx surgery    Diagnostic tests  R shoulder xray 05/17/18: No acute fracture  or dislocation; Osteoarthritic change noted in the right glenohumeral joint. R shoulder MRI: 05/26: Full-thickness tear of the vast majority of the supraspinatus tendon; Mild distal infraspinatus partial-thickness articular surface tearing. Mild subscapularis tendinopathy. Poor visualization of the intra-articular segment of the long head of the biceps compatible with at least partial tearing.    Currently in Pain?  No/denies         [facility] PT Assessment - 08/19/18 0001      Observation/Other Assessments   Focus on Therapeutic Outcomes (FOTO)   Shoulder: 63 (37% limited, 22% predicted)      AROM   Right/Left Shoulder  Right    Right Shoulder Flexion  135 Degrees    Right Shoulder ABduction  126 Degrees    Right Shoulder Internal Rotation  --   FIR T12   Right Shoulder External Rotation  --   FER C7     PROM   Right Shoulder Flexion  147 Degrees    Right Shoulder ABduction  118 Degrees    Right  Shoulder Internal Rotation  82 Degrees    Right Shoulder External Rotation  70 Degrees      Strength   Strength Assessment Site  Shoulder    Right/Left Shoulder  Right    Right Shoulder Flexion  4+/5    Right Shoulder ABduction  4+/5    Right Shoulder Internal Rotation  4/5    Right Shoulder External Rotation  4/5                   [facility] Adult PT Treatment/Exercise - 08/19/18 0001      Exercises   Exercises  Shoulder      Shoulder Exercises: Supine   Protraction  Both;Strengthening;15 reps;Weights;Limitations   cues for straight elbows   Protraction Weight (lbs)  4      Shoulder Exercises: Seated   Other Seated Exercises  rhythmic stabilization 3x20" with yellow medball 19 90 deg flexion      Shoulder Exercises: Sidelying   External Rotation  Right;Strengthening;15 reps    External Rotation Weight (lbs)  2    External Rotation Limitations  2x15; dowel under elbow    ABduction  Right;10 reps;Limitations;Weights   cues for alignment   ABduction Weight (lbs)  1    ABduction Limitations  thumb up      Shoulder Exercises: Standing   External Rotation  Right;Theraband;Limitations;15 reps    Theraband Level (Shoulder External Rotation)  Level 2 (Red)    External Rotation Limitations  dowel under elbow    Internal Rotation  Right;Theraband;15 reps    Theraband Level (Shoulder Internal Rotation)  Level 2 (Red)    Internal Rotation Limitations  dowel under elbow    Flexion  Strengthening;Right;10 reps;Weights;Limitations    Shoulder Flexion Weight (lbs)  2-7 lbs    Flexion Limitations  reaching to overhead shelf    Row  Both;Strengthening;Theraband;Limitations;10 reps    Theraband Level (Shoulder Row)  Level 2 (Red);Level 3 (Green)    Row Limitations  B row + ER; 2x10      Shoulder Exercises: ROM/Strengthening   UBE (Upper Arm Bike)  L 2 3/3      Manual Therapy   Manual Therapy  Soft tissue mobilization;Passive ROM;Taping    Soft tissue mobilization  R  infraspinatus/teres min- soft tissue restriction and tenderness    Passive ROM  R shoulder PROM in all planes to tolerance     [employer]  [employer]   [employer]  R shoulder impingement taping pattern              PT Education - 08/19/18 1816    Education Details  reminded about kinesiotape precautions, wear time, removal    Person(s) Educated  Patient    Methods  Explanation    Comprehension  Verbalized understanding       PT Short Term Goals - 08/19/18 1817      PT SHORT TERM GOAL #1   Title  Patient to be independent with inital HEP.    Time  4    Period  Weeks    Status  Achieved        PT Long Term Goals - 08/19/18 1403      PT LONG TERM GOAL #1   Title  Patient to be independent with advanced HEP.    Time  4    Period  Weeks    Status  Achieved      PT LONG TERM GOAL #2   Title  Patient to demonstrate R shoulder AROM/PROM WFL without pain limiting.     Time  4    Period  Weeks    Status  Achieved   improvements made in R shoulder AROM flexion, abduction, ER and PROM abduction and IR     PT LONG TERM GOAL #3   Title  Patient to demonstrate >=4+/5 R UE strength.    Time  8    Period  Weeks    Status  Partially Met   most limited in R shoulder ER, 4/5     PT LONG TERM GOAL #4   Title  Patient to demonstrate lifting 15# box overhead with <=1/10 pain.    Time  4    Period  Weeks    Status  Partially Met   Able to lift 7# with R UE overhead with mild bicep pain           Plan - 08/19/18 1820    Clinical Impression Statement  Patient arrived to session with report of 95% improvement since initial eval. Reports still some difficulty with overhead lifting. Updated goals this session- improvements made in R shoulder AROM flexion, abduction, ER and PROM abduction and IR. Patient also demonstrating improvements in R shoulder strength, with ER still most limited. Able to lift 7 lb dumbbell overhead with only mild "bicep pain" this  session. Patient received consolidated HEP handout last session- provided cues and correction of form for exercises from HEP handout this date. Patient with large skin tear on L arm- patient reports that he tore a bandaid off. Questioned patient about occurrence of skin tears with past use of kinesiotape- patient denied. Reminded patient of removal, wear time, precautions of kinesiotape. Patient reported understanding. Received taping to R shoulder at end of session for continued pain relief. Patient to be placed on 30 day hold at this time d/t meeting or partially meeting all goals at this time.     PT Treatment/Interventions  ADLs/Self Care Home Management;Electrical Stimulation;Cryotherapy;Moist Heat;Therapeutic exercise;Therapeutic activities;Functional mobility training;Ultrasound;Patient/family education;Manual techniques;Vasopneumatic Device;Taping;Splinting;Energy conservation;Dry needling;Passive range of motion    PT Next Visit Plan  30 day hold at this time    Consulted and Agree with Plan of Care  Patient       Patient will benefit from skilled therapeutic intervention in order to improve the following deficits and impairments:  Hypomobility, Decreased activity tolerance, Decreased strength, Pain, Impaired UE functional use, Decreased range of  motion, Impaired flexibility, Postural dysfunction  Visit Diagnosis: Acute pain of right shoulder  Stiffness of right shoulder, not elsewhere classified  Muscle weakness (generalized)  Other symptoms and signs involving the musculoskeletal system     Problem List Patient Active Problem List   Diagnosis Date Noted  . Hypotension, chronic 06/06/2018  . Enterococcal bacteremia   . Pulmonary edema   . Shoulder pain   . PAH (pulmonary artery hypertension) ([place]) 02/20/2018  . Hypokalemia 02/18/2018  . Anticoagulated on Coumadin 01/04/2018  . Localized edema 01/04/2018  . Leukocytoclastic vasculitis ([place]) 10/01/2017  . Palpitations  10/01/2017  . Maculopapular rash 09/03/2017  . Epidermoid cyst of skin 08/24/2017  . Pleural effusion, bilateral 08/09/2017  . Pulmonary hypertension ([place]) 08/09/2017  . Supratherapeutic INR 07/26/2017  . Asymptomatic microscopic hematuria 06/20/2017  . Prostate cancer screening 06/20/2017  . Atrial flutter ([place]) 03/29/2017  . Chronic anticoagulation 03/29/2017  . Coronary artery disease involving native coronary artery of native heart with angina pectoris ([place]) 03/29/2017  . H/O maze procedure 03/29/2017  . [name] 03/29/2017  . Hx of CABG 03/29/2017  . Hypertensive heart disease with heart failure ([place]) 03/29/2017  . Non-sustained ventricular tachycardia ([place]) 03/29/2017  . Hyperlipidemia 03/29/2017  . Syncope 03/29/2017  . Cough 10/17/2016  . COPD (chronic obstructive pulmonary disease) ([place]) 06/12/2016  . Pulmonary nodules 06/12/2016  . Anxiety 05/10/2016  . 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 Chronic allergic rhinitis 04/28/2016  . History of prosthetic aortic valve 03/20/2016  . Post-nasal drainage 02/25/2016  . Dyspnea 02/01/2016  . Syncope and collapse 02/01/2016  . Typical atrial flutter ([place]) 02/01/2016  . Lumbar radicular pain 01/19/2016  . Chronic midline back pain 12/14/2015  . Essential hypertension 12/14/2015  . Chronic prostatitis 07/23/2015  . Nephrolithiasis 07/23/2015    Janene Harvey, PT, DPT 08/19/18 6:22 PM   [facility] [address]  [place], [place], [address] Phone: [phone]   Fax:  [phone]  Name: Lance Hicks MRN: [id number] Date of Birth: 01/14/1946  PHYSICAL THERAPY DISCHARGE SUMMARY  Visits from Start of Care: 14  Current functional level related to goals / functional outcomes: See above clinical impression   Remaining deficits: Decreased strength and decreased functional lifting tolerance    Education /  Equipment: HEP  Plan: Patient agrees to discharge.  Patient goals were partially met. Patient is being discharged due to being pleased with the current functional level.  ?????     Janene Harvey, PT, DPT 09/23/18 2:16 PM

## 2018-08-20 ENCOUNTER — Telehealth (HOSPITAL_COMMUNITY): Payer: Self-pay | Admitting: *Deleted

## 2018-08-20 ENCOUNTER — Ambulatory Visit (INDEPENDENT_AMBULATORY_CARE_PROVIDER_SITE_OTHER): Payer: Medicare Other

## 2018-08-20 DIAGNOSIS — Z7901 Long term (current) use of anticoagulants: Secondary | ICD-10-CM

## 2018-08-20 DIAGNOSIS — I4892 Unspecified atrial flutter: Secondary | ICD-10-CM | POA: Diagnosis not present

## 2018-08-20 LAB — POCT INR: INR: 2.4 (ref 2.0–3.0)

## 2018-08-20 NOTE — Telephone Encounter (Signed)
Pt left VM requesting we send an order to [facility] to d/c home oxygen. Patient uses [employer] and has not needed [facility] oxygen since February. Order faxed to [facility]. Pt aware.

## 2018-08-20 NOTE — Patient Instructions (Signed)
Description   Take 5 mg today then continue dose of 2.5 mg daily except for 5 mg on Friday's. Please call our office with any medication changes or concerns [phone]. Return in 10 days for INR check.

## 2018-08-27 ENCOUNTER — Ambulatory Visit (INDEPENDENT_AMBULATORY_CARE_PROVIDER_SITE_OTHER)
Admission: RE | Admit: 2018-08-27 | Discharge: 2018-08-27 | Disposition: A | Discharge status: Home / Self Care | Payer: Medicare Other | Source: Ambulatory Visit | Attending: Emergency Medicine | Admitting: Emergency Medicine

## 2018-08-27 ENCOUNTER — Encounter (INDEPENDENT_AMBULATORY_CARE_PROVIDER_SITE_OTHER): Payer: Self-pay

## 2018-08-27 DIAGNOSIS — R918 Other nonspecific abnormal finding of lung field: Secondary | ICD-10-CM

## 2018-08-27 DIAGNOSIS — R911 Solitary pulmonary nodule: Secondary | ICD-10-CM | POA: Diagnosis not present

## 2018-08-29 ENCOUNTER — Ambulatory Visit (INDEPENDENT_AMBULATORY_CARE_PROVIDER_SITE_OTHER): Payer: Medicare Other | Admitting: Pharmacist

## 2018-08-29 DIAGNOSIS — I4892 Unspecified atrial flutter: Secondary | ICD-10-CM

## 2018-08-29 DIAGNOSIS — Z7901 Long term (current) use of anticoagulants: Secondary | ICD-10-CM | POA: Diagnosis not present

## 2018-08-29 LAB — POCT INR: INR: 1.8 — AB (ref 2.0–3.0)

## 2018-08-29 NOTE — Patient Instructions (Signed)
Description   Take 5 mg today then change dose to 2.5 mg daily except for 5 mg on Mondays and Fridays. Please call our office with any medication changes or concerns [phone]. Return in 10 days for INR check.

## 2018-09-05 ENCOUNTER — Ambulatory Visit (INDEPENDENT_AMBULATORY_CARE_PROVIDER_SITE_OTHER): Payer: Medicare Other | Admitting: Emergency Medicine

## 2018-09-05 ENCOUNTER — Encounter: Payer: Self-pay | Admitting: Emergency Medicine

## 2018-09-05 DIAGNOSIS — Z23 Encounter for immunization: Secondary | ICD-10-CM

## 2018-09-05 DIAGNOSIS — J449 Chronic obstructive pulmonary disease, unspecified: Secondary | ICD-10-CM

## 2018-09-05 DIAGNOSIS — R918 Other nonspecific abnormal finding of lung field: Secondary | ICD-10-CM

## 2018-09-05 DIAGNOSIS — I2721 Secondary pulmonary arterial hypertension: Secondary | ICD-10-CM

## 2018-09-05 DIAGNOSIS — I2510 Atherosclerotic heart disease of native coronary artery without angina pectoris: Secondary | ICD-10-CM

## 2018-09-05 NOTE — Assessment & Plan Note (Signed)
COPD but has not required any maintenance bronc dilator therapy.  He keeps albuterol available but does not use frequently.  I will not start a schedule medication at this time.

## 2018-09-05 NOTE — Assessment & Plan Note (Signed)
Stable on most recent CT scan of the chest.  We are following a 9 mm right middle lobe nodule in particular.  Needs a repeat scan in March 2020.

## 2018-09-05 NOTE — Assessment & Plan Note (Signed)
Largely due to his left-sided disease but also some component of obstructive lung disease.  Continue his macitentan and tadalafil, anticoagulation and torsemide as ordered.  He has decreased torsemide dosing slightly and of asked him to talk to cardiology about the final appropriate dose.  There is no evidence today of volume overload.

## 2018-09-05 NOTE — Progress Notes (Signed)
Subjective:    Patient ID: Lance Hicks, male    DOB: 1946/08/17, 72 y.o.   MRN: [id number]  HPI 72 year old former smoker (30 pack years) with a history of coronary disease, CABG, permanent atrial fibrillation, aortic stenosis with valve replacement plus Maze procedure and left atrial appendage excision.  He has secondary pulmonary hypertension in the setting of all the above.  Also with chronic kidney disease, cirrhosis that is felt to be due to R heart failure / congestion.  He is on a diuretic regimen, warfarin for anticoagulation, macitentan, tadalafil. He also has a hx traumatic R PTX with a chest tube.   He was admitted to the hospital with apparent decompensation of his CHF, total body volume overload.  He had an enterococcal bacteremia that was treated with antibiotics.  His abdominal ultrasound confirmed cirrhosis with only mild ascites at that time.  High-resolution CT scan of his chest was done on 05/16/2018 which I have reviewed.  This was to evaluate for possible ILD.  He had base predominant groundglass attenuation, no UIP pattern.  Also identified a 0.9 cm right lower lobe solid pulmonary nodule. He has an inogen POC, never uses it but has seen documented desaturations w heavy exertion. He is able to exert, walks on a treadmill. He works on his small farm. He had a home sleep study, he can';t remember when. He was told that he didn't have OSA. He doesn't really feel limited by breathing. He has daily cough. Takes zyrtec prn.   Right heart catheterization 05/14/2018 was reviewed by me.  His PA pressures 69/24 (39), PAOP 20, Fick cardiac output 7.0.  There was no evidence of an intracardiac shunt.   TEE 05/21/2018 >> LVEF 50 to 55%, mechanical aortic valve, severely dilated left atrium, dilated right ventricle with severely reduced function, dilated right atrium.  No evidence endocarditis  Pulmonary function testing 01/21/2018 reviewed by me.  This shows mild mixed restriction and  obstruction without a bronchodilator response, restrictive lung volumes, and a profoundly decreased diffusion capacity that does not correct when adjusted for his alveolar volume.  ROV 09/05/18 --72 year old man who follows up today for COPD, moderate mixed restriction and obstruction.  Secondary pulmonary hypertension in the setting of this and also left-sided heart disease (on macitentan and tadalafil, diuretics).  Associated hypoxemic respiratory failure.  He has been managed with Pulmicort and at last visit we discussed using albuterol as needed to see if he would get benefit.  He also has right lower lobe pulmonary nodules that we have followed for interval change.  He had a repeat CT chest on 08/27/2018 that I have reviewed and which shows that his 9 mm right middle lobe nodule is stable in size and appearance. He reports that his breathing has done ok, hasn't needed to try the albuterol.     Review of Systems  Constitutional: Negative for fever and unexpected weight change.  HENT: Negative for congestion, dental problem, ear pain, nosebleeds, postnasal drip, rhinorrhea, sinus pressure, sneezing, sore throat and trouble swallowing.   Eyes: Negative for redness and itching.  Respiratory: Positive for shortness of breath. Negative for cough, chest tightness and wheezing.   Cardiovascular: Negative for palpitations and leg swelling.  Gastrointestinal: Negative for nausea and vomiting.  Genitourinary: Negative for dysuria.  Musculoskeletal: Negative for joint swelling.  Skin: Negative for rash.  Neurological: Negative for headaches.  Hematological: Does not bruise/bleed easily.  Psychiatric/Behavioral: Negative for dysphoric mood. The patient is not nervous/anxious.  Past Medical History:  Diagnosis Date  . 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 In light of concurrent left nephrolithiasis, will check CT renal colic next year  instead of renal US.   [name] Anticoagulated on Coumadin 01/04/2018  . Anxiety 05/10/2016   Last Assessment & Plan:  Doing well off of zoloft.  . Asymptomatic microscopic hematuria 06/20/2017   Last Assessment & Plan:  Had hematuria workup in [facility] in 2016 which negative CT and cystoscopy. UA with 2+ blood last visit - we discussed recommendation for repeat workup at 5 years or if degree of hematuria progresses.   . Atrial fibrillation ([place]) 03/29/2017  . Atrial flutter ([place]) 03/29/2017  . Chronic allergic rhinitis 04/28/2016   Last Assessment & Plan:  Continue astelin  . Chronic anticoagulation 03/29/2017  . Chronic atrial fibrillation ([place]) 07/23/2015   Last Assessment & Plan:  Coumadin and metoprolol, cardiology referral to establish care.  . Chronic midline back pain 12/14/2015   Last Assessment & Plan:  Pain management referral for further evaluation.  . Chronic prostatitis 07/23/2015   Last Assessment & Plan:  Has largely resolved since stopping bike riding. Recommend annual DRE AND PSA - will see back 12/2015 for annual screening, given 1st degree fhx. To call office for recurrent prostatitis symptoms.   [name] COPD (chronic obstructive pulmonary disease) ([place]) 06/12/2016  . Coronary artery disease involving native coronary artery of native heart with angina pectoris ([place]) 03/29/2017  . Cough 10/17/2016   Last Assessment & Plan:  Discussed typical course for acute viral illness. If symptoms worsen or fail to improve by 7-10d, delayed ATBs, fluids, rest, NSAIDs/APAP prn. Seek care if not improving. Needs earlier INR check due to ATBs.  [name] Dyspnea 02/01/2016   Last Assessment & Plan:  Overall improving, eval by pulm, plan for CT, neg stress test with cardiology. Recent switch to carvedilol due to side effects.  . Epidermoid cyst of skin 08/24/2017  . Essential hypertension 12/14/2015   Last Assessment & Plan:  Hypertension control: controlled  Medications: compliant Medication Management: as noted in orders Home blood  pressure monitoring recommended additionally as needed for symptoms  The patient's care plan was reviewed and updated. Instructions and counseling were provided regarding patient goals and barriers. He was counseled to adopt a healthy lifestyle. Educational resources and self-management tools have been provided as charted in [facility] list.   . H/O maze procedure 03/29/2017  . [name] 03/29/2017   Overview:  2011  . Hx of CABG 03/29/2017  . Hyperlipidemia 03/29/2017  . Hypertensive heart disease 03/29/2017  . Kidney stones 07/23/2015   Overview:  x 3  Last Assessment & Plan:  By [place] has left nephrolithiasis, but not visible by KUB. Will check CT renal colic next year to assess both stone burden as well as to surveil AML.   . Leukocytoclastic vasculitis ([place]) 10/01/2017  . Localized edema 01/04/2018  . Lumbar radicular pain 01/19/2016  . Maculopapular rash 09/03/2017  . Nephrolithiasis 07/23/2015   Overview:  x 3  Last Assessment & Plan:  By [place] has left nephrolithiasis, but not visible by KUB. Will check CT renal colic next year to assess both stone burden as well as to surveil AML.   Overview:  x 3  Last Assessment & Plan:  Has 65m nonobstructing LUP stone - not visible by KUB.  Will check renal [place] - he will contact office sooner  if symptomatic.   . Non-sustained ventricular tachycardia ([place]) 03/29/2017  . Other hyperlipidemia 03/29/2017  . Palpitations 10/01/2017  . Paroxysmal atrial fibrillation ([place]) 03/29/2017  . Paroxysmal atrial fibrillation ([place]) 03/29/2017  . Pleural effusion, bilateral 08/09/2017  . Post-nasal drainage 02/25/2016   Last Assessment & Plan:  Trial zyrtec and flonase  . Prostate cancer screening 06/20/2017   Last Assessment & Plan:  Recommend continued annual CaP screening until within 10 years of life expectancy. Given good health and fhx of longevity, would anticipate CaP screening to continue until age 35.  PSA today and again in one year on day of visit.  .  Pulmonary hypertension ([place]) 08/09/2017  . Pulmonary nodules 06/12/2016  . S/P AVR (aortic valve replacement) 03/20/2016  . Supratherapeutic INR 07/26/2017  . Syncope 03/29/2017  . Syncope and collapse 02/01/2016  . Typical atrial flutter ([place]) 02/01/2016     Family History  Problem Relation Age of Onset  . Asthma Mother   . Arthritis Mother   . Heart attack Father   . Hypertension Father   . Stroke Paternal Grandmother   . Colon cancer Neg Hx      Social History   Socioeconomic History  . Marital status: Single    Spouse name: Not on file  . Number of children: Not on file  . Years of education: Not on file  . Highest education level: Not on file  Occupational History  . Not on file  Social Needs  . Financial resource strain: Not on file  . Food insecurity:    Worry: Not on file    Inability: Not on file  . Transportation needs:    Medical: Not on file    Non-medical: Not on file  Tobacco Use  . Smoking status: Former Smoker    Packs/day: 2.00    Years: 34.00    Pack years: 68.00    Types: Cigarettes    Last attempt to quit: 07/16/2000    Years since quitting: 18.1  . Smokeless tobacco: Never Used  Substance and Sexual Activity  . Alcohol use: Yes  . Drug use: No  . Sexual activity: Not on file  Lifestyle  . Physical activity:    Days per week: Not on file    Minutes per session: Not on file  . Stress: Not on file  Relationships  . Social connections:    Talks on phone: Not on file    Gets together: Not on file    Attends religious service: Not on file    Active member of club or organization: Not on file    Attends meetings of clubs or organizations: Not on file    Relationship status: Not on file  . Intimate partner violence:    Fear of current or ex partner: Not on file    Emotionally abused: Not on file    Physically abused: Not on file    Forced sexual activity: Not on file  Other Topics Concern  . Not on file  Social History Narrative  . Not on file    was a Higher education careers adviser, has lived in [place], [place], [place] Was in [place], was in [place], no other inhaled exposures.  Has been exposed to paint fumes in the past  Allergies  Allergen Reactions  . Amoxicillin-Pot Clavulanate Diarrhea    Stomach pain Has patient had a PCN reaction causing immediate rash, facial/tongue/throat swelling, SOB or lightheadedness with hypotension: No Has patient had a PCN reaction causing severe rash involving  mucus membranes or skin necrosis: No Has patient had a PCN reaction that required hospitalization: No Has patient had a PCN reaction occurring within the last 10 years: Yes If all of the above answers are "NO", then may proceed with Cephalosporin use.   . Tape Rash and Other (See Comments)    Surgical tape     Outpatient Medications Prior to Visit  Medication Sig Dispense Refill  . acetaminophen (TYLENOL) 650 MG CR tablet Take 650 mg by mouth every 8 (eight) hours as needed for pain.    [name] albuterol (PROAIR HFA) 108 (90 Base) MCG/ACT inhaler Inhale 2 puffs into the lungs every 4 (four) hours as needed for wheezing or shortness of breath. 1 Inhaler 5  . aspirin EC 81 MG tablet Take 81 mg by mouth daily.    [name] atorvastatin (LIPITOR) 40 MG tablet Take 1 tablet (40 mg total) by mouth daily at 6 PM. 30 tablet 6  . B Complex-C (B-COMPLEX WITH VITAMIN C) tablet Take 1 tablet by mouth daily.     . cetirizine (ZYRTEC ALLERGY) 10 MG tablet Take 10 mg by mouth daily as needed for allergies.     . Coenzyme Q10-Vitamin E (QUNOL ULTRA COQ10) 100-150 MG-UNIT CAPS Take 1 capsule by mouth every other day.    . ENSURE (ENSURE) Take 237 mLs by mouth every other day.    . Liniments (SALONPAS EX) Apply 1 patch topically daily as needed (pain).     . Macitentan (OPSUMIT) 10 MG TABS Take 1 tablet (10 mg total) by mouth daily. 30 tablet 11  . midodrine (PROAMATINE) 10 MG tablet Take 1 tablet (10 mg total) by mouth 3 (three) times daily with meals. 90 tablet 6  . multivitamin-lutein  (OCUVITE-LUTEIN) CAPS capsule Take 1 capsule by mouth daily.    . pantoprazole (PROTONIX) 40 MG tablet Take 1 tablet (40 mg total) by mouth daily. 30 tablet 11  . spironolactone (ALDACTONE) 25 MG tablet Take 0.5 tablets (12.5 mg total) by mouth daily. 45 tablet 3  . tadalafil, PAH, (ADCIRCA) 20 MG tablet Take 2 tablets (40 mg total) by mouth daily. 60 tablet 11  . torsemide (DEMADEX) 20 MG tablet Take 3 tablets (60 mg total) by mouth daily. (Patient taking differently: Take 20 mg by mouth daily. ) 90 tablet 6  . warfarin (COUMADIN) 5 MG tablet As directed by coumadin clinic 50 tablet 0   No facility-administered medications prior to visit.         Objective:   Physical Exam Vitals:   09/05/18 1054  BP: 132/68  Pulse: 66  SpO2: 98%  Weight: 147 lb 12.8 oz (67 kg)  Height: _0  (1.727 m)   Gen: Pleasant, well-nourished, in no distress,  normal affect  ENT: No lesions,  mouth clear,  oropharynx clear, no postnasal drip  Neck: No JVD, no stridor  Lungs: No use of accessory muscles, no wheeze or crackles  Cardiovascular: RRR, heart sounds normal, no murmur or gallops, no peripheral edema  Musculoskeletal: No deformities, no cyanosis or clubbing  Neuro: alert, non focal  Skin: Warm, no lesions or rash    Assessment & Plan:  COPD (chronic obstructive pulmonary disease) (HCC) COPD but has not required any maintenance bronc dilator therapy.  He keeps albuterol available but does not use frequently.  I will not start a schedule medication at this time.  PAH (pulmonary artery hypertension) ([place]) Largely due to his left-sided disease but also some component of obstructive lung disease.  Continue his macitentan and tadalafil, anticoagulation and torsemide as ordered.  He has decreased torsemide dosing slightly and of asked him to talk to cardiology about the final appropriate dose.  There is no evidence today of volume overload.  Pulmonary nodules Stable on most recent CT scan of  the chest.  We are following a 9 mm right middle lobe nodule in particular.  Needs a repeat scan in March 2020.  Baltazar Apo, MD, PhD 09/05/2018, 11:30 AM [facility] [phone] or if no answer [phone]

## 2018-09-05 NOTE — Patient Instructions (Addendum)
We will plan to repeat your CT chest in 6 months to follow your pulmonary nodules.  Please continue your current medications as you are taking them. Keep albuterol available to use 2 puffs up to every 4 hours as needed for shortness of breath. Flu shot today Follow with Dr Lamonte Sakai in 6 months to review the CT scan together, or sooner if you have any problems

## 2018-09-10 ENCOUNTER — Ambulatory Visit (INDEPENDENT_AMBULATORY_CARE_PROVIDER_SITE_OTHER): Payer: Medicare Other | Admitting: Pharmacist

## 2018-09-10 DIAGNOSIS — I4892 Unspecified atrial flutter: Secondary | ICD-10-CM

## 2018-09-10 DIAGNOSIS — Z7901 Long term (current) use of anticoagulants: Secondary | ICD-10-CM | POA: Diagnosis not present

## 2018-09-10 LAB — POCT INR: INR: 1.7 — AB (ref 2.0–3.0)

## 2018-09-10 NOTE — Patient Instructions (Signed)
Description   Change dose to 2.5 mg daily except for 5 mg on Tuesdays and Saturdays. Please call our office with any medication changes or concerns [phone]. Return in 7 days for INR check.

## 2018-09-17 ENCOUNTER — Ambulatory Visit (INDEPENDENT_AMBULATORY_CARE_PROVIDER_SITE_OTHER): Payer: Medicare Other | Admitting: Pharmacist

## 2018-09-17 DIAGNOSIS — Z7901 Long term (current) use of anticoagulants: Secondary | ICD-10-CM | POA: Diagnosis not present

## 2018-09-17 DIAGNOSIS — I4892 Unspecified atrial flutter: Secondary | ICD-10-CM | POA: Diagnosis not present

## 2018-09-17 LAB — POCT INR: INR: 1.9 — AB (ref 2.0–3.0)

## 2018-09-17 NOTE — Patient Instructions (Signed)
Description   Take 1.5 tablets today then change dose to 2.5 mg daily except for 5 mg on Tuesdays Thursdays and Saturdays. Please call our office with any medication changes or concerns [phone]. Return in 14 days for INR check.

## 2018-10-01 ENCOUNTER — Ambulatory Visit (INDEPENDENT_AMBULATORY_CARE_PROVIDER_SITE_OTHER): Payer: Medicare Other | Admitting: Pharmacist

## 2018-10-01 ENCOUNTER — Telehealth: Payer: Self-pay | Admitting: *Deleted

## 2018-10-01 DIAGNOSIS — Z7901 Long term (current) use of anticoagulants: Secondary | ICD-10-CM

## 2018-10-01 DIAGNOSIS — I4892 Unspecified atrial flutter: Secondary | ICD-10-CM | POA: Diagnosis not present

## 2018-10-01 LAB — POCT INR: INR: 2.5 (ref 2.0–3.0)

## 2018-10-01 MED ORDER — AMOXICILLIN 500 MG PO CAPS: ORAL_CAPSULE | ORAL | 1 refills | Status: DC

## 2018-10-01 NOTE — Telephone Encounter (Signed)
Clarification was given.

## 2018-10-01 NOTE — Telephone Encounter (Signed)
Rx for Amoxicillin 500 mg states take 4 capsules (4g) 30 to 60 minutes before dentist. Confused about the (4g) part. Please clarify.

## 2018-10-01 NOTE — Patient Instructions (Signed)
Description   Continue  2.5 mg daily except for 5 mg on Tuesdays and Saturdays. Please call our office with any medication changes or concerns [phone]. Return in 3 weeks for INR check.

## 2018-10-02 ENCOUNTER — Encounter (HOSPITAL_COMMUNITY): Payer: Self-pay | Admitting: Internal Medicine

## 2018-10-02 ENCOUNTER — Other Ambulatory Visit: Payer: Self-pay

## 2018-10-02 ENCOUNTER — Ambulatory Visit (HOSPITAL_COMMUNITY)
Admission: RE | Admit: 2018-10-02 | Discharge: 2018-10-02 | Disposition: A | Discharge status: Home / Self Care | Payer: Medicare Other | Source: Ambulatory Visit | Attending: Internal Medicine | Admitting: Internal Medicine

## 2018-10-02 VITALS — BP 119/65 | HR 64 | Wt 152.4 lb

## 2018-10-02 DIAGNOSIS — I119 Hypertensive heart disease without heart failure: Secondary | ICD-10-CM | POA: Insufficient documentation

## 2018-10-02 DIAGNOSIS — K7460 Unspecified cirrhosis of liver: Secondary | ICD-10-CM | POA: Diagnosis not present

## 2018-10-02 DIAGNOSIS — M25511 Pain in right shoulder: Secondary | ICD-10-CM | POA: Insufficient documentation

## 2018-10-02 DIAGNOSIS — I482 Chronic atrial fibrillation, unspecified: Secondary | ICD-10-CM | POA: Insufficient documentation

## 2018-10-02 DIAGNOSIS — Z9049 Acquired absence of other specified parts of digestive tract: Secondary | ICD-10-CM | POA: Insufficient documentation

## 2018-10-02 DIAGNOSIS — R188 Other ascites: Secondary | ICD-10-CM | POA: Diagnosis not present

## 2018-10-02 DIAGNOSIS — Z8249 Family history of ischemic heart disease and other diseases of the circulatory system: Secondary | ICD-10-CM | POA: Insufficient documentation

## 2018-10-02 DIAGNOSIS — J9610 Chronic respiratory failure, unspecified whether with hypoxia or hypercapnia: Secondary | ICD-10-CM | POA: Diagnosis not present

## 2018-10-02 DIAGNOSIS — Z87891 Personal history of nicotine dependence: Secondary | ICD-10-CM | POA: Insufficient documentation

## 2018-10-02 DIAGNOSIS — F419 Anxiety disorder, unspecified: Secondary | ICD-10-CM | POA: Insufficient documentation

## 2018-10-02 DIAGNOSIS — Z888 Allergy status to other drugs, medicaments and biological substances status: Secondary | ICD-10-CM | POA: Diagnosis not present

## 2018-10-02 DIAGNOSIS — E7849 Other hyperlipidemia: Secondary | ICD-10-CM | POA: Insufficient documentation

## 2018-10-02 DIAGNOSIS — Z79899 Other long term (current) drug therapy: Secondary | ICD-10-CM | POA: Diagnosis not present

## 2018-10-02 DIAGNOSIS — Z952 Presence of prosthetic heart valve: Secondary | ICD-10-CM | POA: Insufficient documentation

## 2018-10-02 DIAGNOSIS — I4892 Unspecified atrial flutter: Secondary | ICD-10-CM | POA: Diagnosis not present

## 2018-10-02 DIAGNOSIS — Z951 Presence of aortocoronary bypass graft: Secondary | ICD-10-CM | POA: Diagnosis not present

## 2018-10-02 DIAGNOSIS — I350 Nonrheumatic aortic (valve) stenosis: Secondary | ICD-10-CM | POA: Insufficient documentation

## 2018-10-02 DIAGNOSIS — J449 Chronic obstructive pulmonary disease, unspecified: Secondary | ICD-10-CM | POA: Diagnosis not present

## 2018-10-02 DIAGNOSIS — I2510 Atherosclerotic heart disease of native coronary artery without angina pectoris: Secondary | ICD-10-CM

## 2018-10-02 DIAGNOSIS — I272 Pulmonary hypertension, unspecified: Secondary | ICD-10-CM | POA: Diagnosis not present

## 2018-10-02 DIAGNOSIS — I2721 Secondary pulmonary arterial hypertension: Secondary | ICD-10-CM | POA: Diagnosis not present

## 2018-10-02 DIAGNOSIS — Z7982 Long term (current) use of aspirin: Secondary | ICD-10-CM | POA: Diagnosis not present

## 2018-10-02 DIAGNOSIS — Z7901 Long term (current) use of anticoagulants: Secondary | ICD-10-CM | POA: Insufficient documentation

## 2018-10-02 DIAGNOSIS — Z880 Allergy status to penicillin: Secondary | ICD-10-CM | POA: Insufficient documentation

## 2018-10-02 LAB — BASIC METABOLIC PANEL
Anion gap: 12 (ref 5–15)
BUN: 20 mg/dL (ref 8–23)
CO2: 25 mmol/L (ref 22–32)
Calcium: 9.7 mg/dL (ref 8.9–10.3)
Chloride: 104 mmol/L (ref 98–111)
Creatinine, Ser: 1.09 mg/dL (ref 0.61–1.24)
GFR calc Af Amer: >60 mL/min (ref >60)
GFR calc non Af Amer: >60 mL/min (ref >60)
Glucose, Bld: 71 mg/dL (ref 70–99)
Potassium: 3 mmol/L — ABNORMAL LOW (ref 3.5–5.1)
Sodium: 141 mmol/L (ref 135–145)

## 2018-10-02 LAB — BRAIN NATRIURETIC PEPTIDE: B Natriuretic Peptide: 206 pg/mL — ABNORMAL HIGH (ref 0.0–100.0)

## 2018-10-02 NOTE — Progress Notes (Signed)
ADVANCED HF CLINIC NOTE   Date:  10/02/2018   ID:  Lance Hicks, DOB Nov 11, 1946, MRN [id number]  PCP:  Lance Roger, MD  Cardiologist:  Dr. 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 Also has COPD (quit smoking in 2002 - 1 ppd x 25 years), HL, and HTN.  He moved from [place] about 2 years ago and has been followed by Pulmonary and Cardiology art [facility]. (I have reviewed these records in [place])  In 8/18 admitted for acute pancreatitis. Underwent lap cholecystectomy on 07/29/2017. On 8/6 hedeveloped hypotension and was taken back for open laparotomy due to bleeding from an unclear omental source.He was given fluids and 8 units PRBC perioperatively. He says his breathing has been bad since that time.   Subsequently saw pulmonogist as [place]. Ha hi-res CT, PFTs and VQ (low prob). Felt to have Golds I COPD with asthma overlap and early pulmonary fibrosis with PAH. Thought felt pulmonary pressures may have been elevated due to recent illness. Referred to Rheumatology though appt not until May. Suggested possible RHC.   He was also seen by Cardiology. Echocardiogram 8/18 as below showed normal LV function with septal flattening and RVSP 65-1mHG. There was no comment on RV size or function. 14-day event monitor showed 10-beat run NSVT. Felt to have chronic AF and PAH. No further testing ordered.  Underwent RHC on 01/18/18 with severe PAH and cor pulmonale. Macitentan and Adcirca. Has started macitentan as he got the first 30 days approved. Has not been able to afford tadalafil or the f/u prescription for macitentan.   Admitted 5/21-5/30/19 with symptomatic hypotension and weight gain. He had a repeat RHC and echo with bubble study  (results below). He was started on midodrine for BP support. Diuresed 25 lbs with IV lasix, then transitioned to torsemide 60 mg daily. Abdominal [place]owed cirrhosis with no evidence of portal HTN. Pulm consulted for possible ILD on High-res chest CT. Ortho consulted for right rotator cuff tear from horse accident PTA. He was treated with antibiotics for Bacteremia. DC weight: 140 lbs.   He presents today for regular follow up. Last visit midodrine was increased and he was referred to pulmonary rehab. Overall doing well. Able walk on treadmill at 3.2-3.4 for 55 minutes with no SOB or CP. Some SOB at times with stairs. He did not go to pulmonary rehab. No longer needs O2. No orthopnea, PND, or edema. Occasional dry cough, sometimes productive when allergies are bad. No fever or chills. No CP or dizziness. No bleeding on coumadin. He has been taking torsemide 20 mg BID and is having trouble with frequent urination all day. Taking all medications. Concerned about grants running out for opsumit and adcirca. Weights ~145 lbs. Occasionally takes extra 20 mg torsemide.   Review of systems complete and found to be negative unless listed in HPI.   Studies:  RHC 5/21/19showed Mild/Moderate PAH in setting of high output with no evidence of intracardiac shunting. Hemodynamics as below.   High-Res Chest CT 05/16/18 1. Slightly irregular 0.9 cm peripheral right middle lobe solid pulmonary nodule. 3 month follow up recommended.  2. Dependent basilar predominant patchy subpleural reticulation and ground-glass attenuation with the suggestion  of minimal associated traction bronchiolectasis. No frank honeycombing. Findings may indicate an interstitial lung disease such as early usual interstitial pneumonia (UIP) or fibrotic phase nonspecific interstitial pneumonia (NSIP). Suggest a follow-up high-resolution chest CT study in 6-12 months to assess temporal pattern stability, as clinically warranted. 3. Mild  cardiomegaly. Minimal interlobular septal thickening and trace dependent bilateral pleural effusions.  4. Small to moderate volume perihepatic ascites. 5. Ectatic 4.3 cm ascending thoracic aorta. Recommend annual imaging followup by CTA or MRA.  6. Left main and 3 vessel coronary atherosclerosis.  US Abdomen RUQ 05/16/18 - s/p cholecystectomy. + hepatic cirrhosis. Mild ascites.   Liver Doppler 05/16/18 - No hepatic, splenic, or portal venous thrombosis or occlusion. Mild ascites.  Echo with bubble study 05/16/18 LVEF 55-60% with "late bubbles" , Mechanical AVR stable with trivial perivalvular regurg, Mild MR, Severe LAE, Severe RV dilation and reduced function. No PFO. Mod TR, PA peak pressure 68 mm Hg  [place] numbers5/24/19: PA 60-19 (32) CVP ~10 CO 5.4 CI 2.9  RHC 05/14/18 RA = 18 RV = 71/17 PA = 69/24 (39) PCW = 20 Fick cardiac output/index = 7.0/3.7 Thermo CO/CI = 7.2/3.8 PVR = 2.6 WU Ao sat = 97% PA sat = 73%, 73% SVC sat = 73%  RA sat = 72%  RHC 01/18/18 RA = 23 RV = 84/21 PA = 81/38 (53) PCW = 22 Fick cardiac output/index = 3.0/1.6 PVR = 10.4 WU FA sat = 98% on 2L  (checked on RA = 93%) PA sat = 60%, 61% SVC sat = 62%   Echo 8/18  1. LVEF is estimated to be 55%. 2. There is septal flattening consistent with right ventricular pressure and or volume overload. 3. A mechanical prosthetic aortic valve is present with mild AI 4. There is moderate to severe tricuspid regurgitation. 5. There is a trivial amount of pulmonic regurgitation. 6. The right ventricular systolic pressure is calculated at 65-62mHg. 7. The inferior vena cava is dilated with <50% inspiratory collapse, suggestive of an elevated right atrial pressure (178mg).  14 Day Patch 10/01/2017-10/15/2017:  -1 run of Ventricular Tachycardia occurred lasting 10 beats with a max rate of 240 bpm (avg 161 bpm). -Atrial Flutter occurred continuously (100% burden), ranging from 55-132 bpm (avg of 87  bpm).   NM Ventilation and Perfusion Lung Scan 10/31/2017:  Low probability of pulmonary embolism, using the PIOPED criteria.  Lower extremity venous duplex 07/25/2017:  1. Low probability of acute deep vein thrombosis in bilateral lower extremities. 2. Calf veins are poorly visualized but appear patent in limited segments visualized.   PFTs 07/02/2017:  FVC86% FEV185% FEV1/FVC 73% DLCO8.2, 32%   6MW -1400 feet; 97% to 85% (recovered with resting)   CT Chest07/08/2017: 1. Stable right middle lobe pulmonary nodule measuring up to 1 cm. 2. Minimal subpleural reticulation without evidence of honeycombing to  suggest pulmonary fibrosis. Stable centrilobular emphysema. 3. Mediastinal lymphadenopathy, increased from prior. 4. Calcified right hilar lymph nodes and multiple calcified granulomas  throughout the right lung consistent with prior granulomatous disease. 5. Ascending thoracic aortic aneurysm measuring up to 4.5 cm, unchanged. 6. Dilatation of the main pulmonary artery suggestive of pulmonary  hypertension. 7. Cardiomegaly and dense atherosclerotic coronary artery calcifications.    Past Medical History:  Diagnosis Date  . 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 In light of concurrent left nephrolithiasis, will check CT renal colic next year instead of renal [place]  .[name]  Anticoagulated on Coumadin 01/04/2018  . Anxiety 05/10/2016   Last Assessment & Plan:  Doing well off of zoloft.  . Asymptomatic microscopic hematuria 06/20/2017   Last Assessment & Plan:  Had hematuria workup in [facility] in 2016 which negative CT and cystoscopy. UA with 2+ blood last visit - we discussed recommendation for repeat workup at 5 years or if degree of hematuria progresses.   . Atrial fibrillation ([place]) 03/29/2017  . Atrial flutter ([place]) 03/29/2017  . Chronic allergic rhinitis 04/28/2016   Last  Assessment & Plan:  Continue astelin  . Chronic anticoagulation 03/29/2017  . Chronic atrial fibrillation 07/23/2015   Last Assessment & Plan:  Coumadin and metoprolol, cardiology referral to establish care.  . Chronic midline back pain 12/14/2015   Last Assessment & Plan:  Pain management referral for further evaluation.  . Chronic prostatitis 07/23/2015   Last Assessment & Plan:  Has largely resolved since stopping bike riding. Recommend annual DRE AND PSA - will see back 12/2015 for annual screening, given 1st degree fhx. To call office for recurrent prostatitis symptoms.   [name] COPD (chronic obstructive pulmonary disease) ([place]) 06/12/2016  . Coronary artery disease involving native coronary artery of native heart with angina pectoris ([place]) 03/29/2017  . Cough 10/17/2016   Last Assessment & Plan:  Discussed typical course for acute viral illness. If symptoms worsen or fail to improve by 7-10d, delayed ATBs, fluids, rest, NSAIDs/APAP prn. Seek care if not improving. Needs earlier INR check due to ATBs.  [name] Dyspnea 02/01/2016   Last Assessment & Plan:  Overall improving, eval by pulm, plan for CT, neg stress test with cardiology. Recent switch to carvedilol due to side effects.  . Epidermoid cyst of skin 08/24/2017  . Essential hypertension 12/14/2015   Last Assessment & Plan:  Hypertension control: controlled  Medications: compliant Medication Management: as noted in orders Home blood pressure monitoring recommended additionally as needed for symptoms  The patient's care plan was reviewed and updated. Instructions and counseling were provided regarding patient goals and barriers. He was counseled to adopt a healthy lifestyle. Educational resources and self-management tools have been provided as charted in [facility] list.   . H/O maze procedure 03/29/2017  . [name] 03/29/2017   Overview:  2011  . Hx of CABG 03/29/2017  . Hyperlipidemia 03/29/2017  . Hypertensive heart disease 03/29/2017  .  Kidney stones 07/23/2015   Overview:  x 3  Last Assessment & Plan:  By [place] has left nephrolithiasis, but not visible by KUB. Will check CT renal colic next year to assess both stone burden as well as to surveil AML.   . Leukocytoclastic vasculitis ([place]) 10/01/2017  . Localized edema 01/04/2018  . Lumbar radicular pain 01/19/2016  . Maculopapular rash 09/03/2017  . Nephrolithiasis 07/23/2015   Overview:  x 3  Last Assessment & Plan:  By [place] has left nephrolithiasis, but not visible by KUB. Will check CT renal colic next year to assess both stone burden as well as to surveil AML.   Overview:  x 3  Last Assessment & Plan:  Has 18m nonobstructing LUP stone - not visible by KUB.  Will check renal [place]2019 - he will contact office sooner if symptomatic.   . Non-sustained ventricular tachycardia ([place] 03/29/2017  . Other hyperlipidemia 03/29/2017  . Palpitations 10/01/2017  . Paroxysmal atrial fibrillation ([place] 03/29/2017  . Paroxysmal atrial fibrillation ([place] 03/29/2017  . Pleural effusion, bilateral 08/09/2017  . Post-nasal drainage 02/25/2016   Last  Assessment & Plan:  Trial zyrtec and flonase  . Prostate cancer screening 06/20/2017   Last Assessment & Plan:  Recommend continued annual CaP screening until within 10 years of life expectancy. Given good health and fhx of longevity, would anticipate CaP screening to continue until age 26.  PSA today and again in one year on day of visit.  . Pulmonary hypertension ([place]) 08/09/2017  . Pulmonary nodules 06/12/2016  . S/P AVR (aortic valve replacement) 03/20/2016  . Supratherapeutic INR 07/26/2017  . Syncope 03/29/2017  . Syncope and collapse 02/01/2016  . Typical atrial flutter ([place]) 02/01/2016    Past Surgical History:  Procedure Laterality Date  . CHOLECYSTECTOMY    . CORONARY ARTERY BYPASS GRAFT    . EXPLORATORY LAPAROTOMY  07/30/2017  . FOOT SURGERY    . FRACTURE SURGERY Right    wrist and forearm  . HERNIA REPAIR    . MECHANICAL AORTIC VALVE REPLACEMENT    .  NASAL SINUS SURGERY    . RIGHT HEART CATH N/A 01/18/2018   Procedure: RIGHT HEART CATH;  Surgeon: Jolaine Artist, MD;  Location: [facility];  Service: Cardiovascular;  Laterality: N/A;  . RIGHT HEART CATH N/A 05/14/2018   Procedure: RIGHT HEART CATH;  Surgeon: Jolaine Artist, MD;  Location: [facility];  Service: Cardiovascular;  Laterality: N/A;  . TEE WITHOUT CARDIOVERSION N/A 05/21/2018   Procedure: TRANSESOPHAGEAL ECHOCARDIOGRAM (TEE);  Surgeon: Jolaine Artist, MD;  Location: [facility];  Service: Cardiovascular;  Laterality: N/A;  . UPPER GASTROINTESTINAL ENDOSCOPY  07/12/2017   Patchy areas of mucosal inflammation noted in the antrum with edema,erthema and ulcerations. Bx. Chronicfocally active gastritis.  [name] VASECTOMY      Current Medications: Current Meds  Medication Sig  . acetaminophen (TYLENOL) 650 MG CR tablet Take 650 mg by mouth every 8 (eight) hours as needed for pain.  [name] albuterol (PROAIR HFA) 108 (90 Base) MCG/ACT inhaler Inhale 2 puffs into the lungs every 4 (four) hours as needed for wheezing or shortness of breath.  [name] amoxicillin (AMOXIL) 500 MG capsule Take 4 capsules 30-60 minutes prior to dental cleaning  . aspirin EC 81 MG tablet Take 81 mg by mouth daily.  [name] atorvastatin (LIPITOR) 40 MG tablet Take 1 tablet (40 mg total) by mouth daily at 6 PM.  . B Complex-C (B-COMPLEX WITH VITAMIN C) tablet Take 1 tablet by mouth daily.   . cetirizine (ZYRTEC ALLERGY) 10 MG tablet Take 10 mg by mouth daily as needed for allergies.   [name] ENSURE (ENSURE) Take 237 mLs by mouth every other day.  . Macitentan (OPSUMIT) 10 MG TABS Take 1 tablet (10 mg total) by mouth daily.  . midodrine (PROAMATINE) 10 MG tablet Take 1 tablet (10 mg total) by mouth 3 (three) times daily with meals.  . multivitamin-lutein (OCUVITE-LUTEIN) CAPS capsule Take 1 capsule by mouth daily.  . pantoprazole (PROTONIX) 40 MG tablet Take 1 tablet (40 mg total) by mouth daily.  [name]  spironolactone (ALDACTONE) 25 MG tablet Take 0.5 tablets (12.5 mg total) by mouth daily.  . tadalafil, PAH, (ADCIRCA) 20 MG tablet Take 2 tablets (40 mg total) by mouth daily.  [name] torsemide (DEMADEX) 20 MG tablet Take 20 mg by mouth 2 (two) times daily.  [name] warfarin (COUMADIN) 5 MG tablet As directed by coumadin clinic     Allergies:   Amoxicillin-pot clavulanate and Tape   Social History   Socioeconomic History  . Marital status: Single  Spouse name: Not on file  . Number of children: Not on file  . Years of education: Not on file  . Highest education level: Not on file  Occupational History  . Not on file  Social Needs  . Financial resource strain: Not on file  . Food insecurity:    Worry: Not on file    Inability: Not on file  . Transportation needs:    Medical: Not on file    Non-medical: Not on file  Tobacco Use  . Smoking status: Former Smoker    Packs/day: 2.00    Years: 34.00    Pack years: 68.00    Types: Cigarettes    Last attempt to quit: 07/16/2000    Years since quitting: 18.2  . Smokeless tobacco: Never Used  Substance and Sexual Activity  . Alcohol use: Yes  . Drug use: No  . Sexual activity: Not on file  Lifestyle  . Physical activity:    Days per week: Not on file    Minutes per session: Not on file  . Stress: Not on file  Relationships  . Social connections:    Talks on phone: Not on file    Gets together: Not on file    Attends religious service: Not on file    Active member of club or organization: Not on file    Attends meetings of clubs or organizations: Not on file    Relationship status: Not on file  Other Topics Concern  . Not on file  Social History Narrative  . Not on file     Family History: The patient's family history includes Arthritis in his mother; Asthma in his mother; Heart attack in his father; Hypertension in his father; Stroke in his paternal grandmother. There is no history of Colon cancer.   Recent Labs: 05/14/2018:  B Natriuretic Peptide 1,247.0 05/15/2018: TSH 2.768 05/16/2018: Magnesium 1.8 05/18/2018: ALT 13 07/23/2018: BUN 26; Creatinine, Ser 1.07; Hemoglobin 12.5; Platelets 283; Potassium 4.9; Sodium 141  Recent Lipid Panel No results found for: CHOL, TRIG, HDL, CHOLHDL, VLDL, LDLCALC, LDLDIRECT  Physical Exam:    VS:  BP 119/65   Pulse 64   Wt 69.1 kg (152 lb 6.4 oz)   SpO2 97%   BMI 23.17 kg/m     Wt Readings from Last 3 Encounters:  10/02/18 69.1 kg (152 lb 6.4 oz)  09/05/18 67 kg (147 lb 12.8 oz)  08/05/18 65.8 kg (145 lb)    General:No resp difficulty. HEENT: Normal Neck: Supple. JVP ~8. Carotids 2+ bilat; no bruits. No thyromegaly or nodule noted. Cor: PMI nondisplaced. RRR, +RV lift, 2/6 TR, loud P2  Mechanical 2nd heart sound Lungs: CTAB, normal effort. Abdomen: Soft, non-tender, non-distended, no HSM. No bruits or masses. +BS  Extremities: No cyanosis, clubbing, or rash. R and LLE tr edema.  Neuro: Alert & orientedx3, cranial nerves grossly intact. moves all 4 extremities w/o difficulty. Affect pleasant   ASSESSMENT/PLAN:    1. Pulmonary hypertension with cor pulmonale/RV failure - Echo with bubble study 05/16/18 LVEF 55-60% with "late bubbles" , Mechanical AVR stable with trivial perivalvular regurg, Mild MR, Severe LAE, Severe RV dilation and reduced function. No PFO. Mod TR, PA peak pressure 68 mm Hg - RHC 1/19 with mod/severe pulm HTN with RV failure.   - RHC 05/14/18 with Mild/Moderate PAH in setting of high output with no evidence of intracardiac shunting. - Ab u/s with cirrhosis but no evidence of portal HTN - Unifying etiology remains unclear suspect  it is a combination of cirrhosis and peripheral shunt physiology. - Volume status okay on exam.  - Continue torsemide 40 mg per day. Encouraged him to take once daily instead of splitting up. Okay to take extra 20 mg PRN.  - Continue spiro 12.5 mg daily. BMET today.  - Continue adcirca 40 mg daily.  - Continue macitentan  10 mg daily.  - Continue midodrine 10 mg am and afternoon, 15 mg at night - Auto-immune serologies negative (checked twice) - ? HHT/shunt/AVMs with late bubbles on bubble study.   2. Chronic respiratory failure - [place] CT 05/16/18 with "dependent basilar predominant patchy subpleural reticulation and ground-glass attenuation with the suggestion of minimal associated traction bronchiolectasis. No frank honeycombing. Findings may indicate an interstitial lung disease such as early usual interstitial pneumonia (UIP) or fibrotic phase nonspecific interstitial pneumonia (NSIP)." - Saw Dr Lamonte Sakai in August. Started on albuterol.  3. Chronic AFL - Rate-controlled. Now off metoprolol. Continue coumadin with mechanical AVR.  - Sounds regular on exam today.   4. CAD - s/p CABG 07/2017 with Dr Roderic Palau in [place].  - No s/s ischemia. - continue ASA and statin  5. S/p mechanical AVR - stable on most recent echo. Continue coumadin/ASA 81. INR goal is 2.5-3 per Dr Lance Hicks. Having trouble with maintaining INR while on ensure. - aware of need for SBE prophylaxis  6. Leukocytoclastic vasculitis - f/u with Rheumatology. No change.  7. Cirrhosis - US Abdomen RUQ 05/16/18 - s/p cholecystectomy. + hepatic cirrhosis. Mild ascites.  - Liver Doppler 05/16/18 - No hepatic, splenic, or portal venous thrombosis or occlusion. Mild ascites. - Continue midodrine 10 mg am, afternoon, 15 mg HS  8. R Shoulder pain with rotator cuff tear - Following with ortho. No surgery yet.   9. Bacteremia 04/2018 - No fever/chills. Finished antibiotics 6/6. Resolved.  BMET  Georgiana Shore, NP  11:22 AM   Patient seen and examined with the above-signed Advanced Practice Provider and/or Housestaff. I personally reviewed laboratory data, imaging studies and relevant notes. I independently examined the patient and formulated the important aspects of the plan. I have edited the note to reflect any of my changes or salient points. I  have personally discussed the plan with the patient and/or family.  Overall much improved. NYHA I-II. Volume status looks good. Cyanosis much improved. Will continue combination therapy for PAH. I suspect he will need selexipag. Continue torsemide at current dose. Check labs. Will do 6MW and echo to further risk stratify and assess need for 3rd agent. CAD stable without ischemia.   Glori Bickers, MD  11:47 AM

## 2018-10-02 NOTE — Addendum Note (Signed)
Encounter addended by: Scarlette Calico, RN on: 10/02/2018 12:10 PM  Actions taken: Sign clinical note, Order list changed, Diagnosis association updated

## 2018-10-02 NOTE — Addendum Note (Signed)
Encounter addended by: Scarlette Calico, RN on: 10/02/2018 12:28 PM  Actions taken: Sign clinical note

## 2018-10-02 NOTE — Progress Notes (Signed)
6 min walk completed.  Pt ambulated 1440 ft (439 m).  O2 sats ranged 96-83% on RA, HR ranged 81-88.

## 2018-10-02 NOTE — Patient Instructions (Signed)
Labs today  Your physician has requested that you have an echocardiogram. Echocardiography is a painless test that uses sound waves to create images of your heart. It provides your doctor with information about the size and shape of your heart and how well your heart's chambers and valves are working. This procedure takes approximately one hour. There are no restrictions for this procedure.  Your physician recommends that you schedule a follow-up appointment in: 2-3 months

## 2018-10-03 ENCOUNTER — Other Ambulatory Visit (HOSPITAL_COMMUNITY): Payer: Self-pay

## 2018-10-03 ENCOUNTER — Telehealth (HOSPITAL_COMMUNITY): Payer: Self-pay

## 2018-10-03 MED ORDER — POTASSIUM CHLORIDE ER 10 MEQ PO TBCR: 20.0000 meq | EXTENDED_RELEASE_TABLET | Freq: Every day | ORAL | 2 refills | Status: DC

## 2018-10-03 MED ORDER — MACITENTAN 10 MG PO TABS: 10.0000 mg | ORAL_TABLET | Freq: Every day | ORAL | 0 refills | Status: DC

## 2018-10-03 NOTE — Telephone Encounter (Signed)
Pt called and informed of starting kcl 20 meq daily. Take 80 meq on day 1. Repeat BMET 1 week. Scheduled for 10/21/18 ar 1015 before ECHO

## 2018-10-11 ENCOUNTER — Telehealth: Payer: Self-pay | Admitting: Cardiology

## 2018-10-11 MED ORDER — WARFARIN SODIUM 5 MG PO TABS: ORAL_TABLET | ORAL | 0 refills | Status: DC

## 2018-10-11 NOTE — Telephone Encounter (Signed)
Rx refill sent as requested

## 2018-10-11 NOTE — Telephone Encounter (Signed)
Please refill coumadin. Thanks! :)

## 2018-10-11 NOTE — Telephone Encounter (Signed)
Patient is out of medication and traveling tomorrow   1. Which medications need to be refilled? (please list name of each medication and dose if known) warfarin 75m  2. Which pharmacy/location (including street and city if local pharmacy) is medication to be sent to? [facility] [address] [place] Do they need a 30 day or 90 day supply? 90 day supply

## 2018-10-21 ENCOUNTER — Ambulatory Visit (HOSPITAL_COMMUNITY)
Admission: RE | Admit: 2018-10-21 | Discharge: 2018-10-21 | Disposition: A | Discharge status: Home / Self Care | Payer: Medicare Other | Source: Ambulatory Visit | Attending: Internal Medicine | Admitting: Internal Medicine

## 2018-10-21 ENCOUNTER — Telehealth (HOSPITAL_COMMUNITY): Payer: Self-pay

## 2018-10-21 DIAGNOSIS — Z952 Presence of prosthetic heart valve: Secondary | ICD-10-CM | POA: Insufficient documentation

## 2018-10-21 DIAGNOSIS — J449 Chronic obstructive pulmonary disease, unspecified: Secondary | ICD-10-CM | POA: Insufficient documentation

## 2018-10-21 DIAGNOSIS — I272 Pulmonary hypertension, unspecified: Secondary | ICD-10-CM | POA: Diagnosis not present

## 2018-10-21 DIAGNOSIS — E785 Hyperlipidemia, unspecified: Secondary | ICD-10-CM

## 2018-10-21 DIAGNOSIS — Z951 Presence of aortocoronary bypass graft: Secondary | ICD-10-CM | POA: Insufficient documentation

## 2018-10-21 DIAGNOSIS — I4891 Unspecified atrial fibrillation: Secondary | ICD-10-CM | POA: Insufficient documentation

## 2018-10-21 DIAGNOSIS — I2721 Secondary pulmonary arterial hypertension: Secondary | ICD-10-CM | POA: Insufficient documentation

## 2018-10-21 DIAGNOSIS — Z9889 Other specified postprocedural states: Secondary | ICD-10-CM | POA: Diagnosis not present

## 2018-10-21 DIAGNOSIS — I509 Heart failure, unspecified: Secondary | ICD-10-CM | POA: Insufficient documentation

## 2018-10-21 DIAGNOSIS — I2510 Atherosclerotic heart disease of native coronary artery without angina pectoris: Secondary | ICD-10-CM | POA: Insufficient documentation

## 2018-10-21 DIAGNOSIS — I081 Rheumatic disorders of both mitral and tricuspid valves: Secondary | ICD-10-CM | POA: Insufficient documentation

## 2018-10-21 DIAGNOSIS — I4892 Unspecified atrial flutter: Secondary | ICD-10-CM | POA: Insufficient documentation

## 2018-10-21 DIAGNOSIS — I340 Nonrheumatic mitral (valve) insufficiency: Secondary | ICD-10-CM

## 2018-10-21 DIAGNOSIS — I110 Hypertensive heart disease with heart failure: Secondary | ICD-10-CM

## 2018-10-21 LAB — BASIC METABOLIC PANEL
Anion gap: 11 (ref 5–15)
BUN: 26 mg/dL — ABNORMAL HIGH (ref 8–23)
CO2: 25 mmol/L (ref 22–32)
Calcium: 9.6 mg/dL (ref 8.9–10.3)
Chloride: 103 mmol/L (ref 98–111)
Creatinine, Ser: 1.23 mg/dL (ref 0.61–1.24)
GFR calc Af Amer: >60 mL/min (ref >60)
GFR calc non Af Amer: 57 mL/min — ABNORMAL LOW (ref >60)
Glucose, Bld: 101 mg/dL — ABNORMAL HIGH (ref 70–99)
Potassium: 3 mmol/L — ABNORMAL LOW (ref 3.5–5.1)
Sodium: 139 mmol/L (ref 135–145)

## 2018-10-21 LAB — LIPID PANEL
Cholesterol: 125 mg/dL (ref 0–200)
HDL: 43 mg/dL
LDL Cholesterol: 71 mg/dL (ref 0–99)
Total CHOL/HDL Ratio: 2.9 ratio
Triglycerides: 57 mg/dL
VLDL: 11 mg/dL (ref 0–40)

## 2018-10-21 MED ORDER — PERFLUTREN LIPID MICROSPHERE
1.0000 mL | INTRAVENOUS | Status: AC | PRN
  Administered 2018-10-21: 2 mL via INTRAVENOUS
  Filled 2018-10-21: qty 10

## 2018-10-21 NOTE — Progress Notes (Signed)
*  PRELIMINARY RESULTS* Echocardiogram 2D Echocardiogram has been performed.  Matilde Bash 10/21/2018, 11:40 AM

## 2018-10-21 NOTE — Telephone Encounter (Signed)
Pt called stating he has to have a tooth pulled and wants to know if he needs to hold his coumadin. Pt states it will be done by an oral surgeon and as outpatient.Pt also states that he is in no hurry being that he is not in pain yet but would like to know for when he is ready for surgery. Please advise.

## 2018-10-22 ENCOUNTER — Ambulatory Visit (INDEPENDENT_AMBULATORY_CARE_PROVIDER_SITE_OTHER): Payer: Medicare Other | Admitting: Pharmacist

## 2018-10-22 DIAGNOSIS — Z7901 Long term (current) use of anticoagulants: Secondary | ICD-10-CM

## 2018-10-22 DIAGNOSIS — I4892 Unspecified atrial flutter: Secondary | ICD-10-CM

## 2018-10-22 LAB — POCT INR: INR: 2.3 (ref 2.0–3.0)

## 2018-10-22 NOTE — Telephone Encounter (Signed)
He can hold if the surgeon requests that but otherwise ok to continue

## 2018-10-22 NOTE — Patient Instructions (Signed)
Description   Take 7.5 mg today then continue  2.5 mg daily except for 5 mg on Tuesdays and Saturdays. Please call our office with any medication changes or concerns [phone]. Return in 3 weeks for INR check.

## 2018-10-22 NOTE — Telephone Encounter (Signed)
Left VM for pt

## 2018-10-28 ENCOUNTER — Telehealth (HOSPITAL_COMMUNITY): Payer: Self-pay

## 2018-10-28 DIAGNOSIS — I50810 Right heart failure, unspecified: Secondary | ICD-10-CM

## 2018-10-28 DIAGNOSIS — I2729 Other secondary pulmonary hypertension: Secondary | ICD-10-CM

## 2018-10-28 NOTE — Telephone Encounter (Signed)
Opened in error

## 2018-11-07 ENCOUNTER — Telehealth (HOSPITAL_COMMUNITY): Payer: Self-pay

## 2018-11-07 NOTE — Telephone Encounter (Signed)
Pt called with results from ECHO and stated he would start the medication not for PAH if Dr. Haroldine Laws wants him too. Message sent to [place]

## 2018-11-11 ENCOUNTER — Telehealth (HOSPITAL_COMMUNITY): Payer: Self-pay | Admitting: Pharmacist

## 2018-11-11 DIAGNOSIS — Z7901 Long term (current) use of anticoagulants: Secondary | ICD-10-CM | POA: Diagnosis not present

## 2018-11-11 DIAGNOSIS — Z5181 Encounter for therapeutic drug level monitoring: Secondary | ICD-10-CM | POA: Diagnosis not present

## 2018-11-11 NOTE — Telephone Encounter (Signed)
Called patient to start enrollment process for Uptravi. No answer so left VM to call back to initiate the process.   Lance Hicks. Velva Harman, PharmD, BCPS, CPP Clinical Pharmacist Phone: [phone] 11/11/2018 3:10 PM

## 2018-11-11 NOTE — Telephone Encounter (Signed)
-----  Message from [facility], [place] sent at 11/11/2018  3:02 PM EST -----   ----- Message ----- From: Jolaine Artist, MD Sent: 11/07/2018   6:01 PM EST To: Kerry Dory, CMA  Yes please start selexipag

## 2018-11-12 ENCOUNTER — Ambulatory Visit (INDEPENDENT_AMBULATORY_CARE_PROVIDER_SITE_OTHER): Payer: Medicare Other | Admitting: Pharmacist

## 2018-11-12 DIAGNOSIS — I4892 Unspecified atrial flutter: Secondary | ICD-10-CM | POA: Diagnosis not present

## 2018-11-12 DIAGNOSIS — Z7901 Long term (current) use of anticoagulants: Secondary | ICD-10-CM

## 2018-11-12 DIAGNOSIS — I110 Hypertensive heart disease with heart failure: Secondary | ICD-10-CM

## 2018-11-12 LAB — POCT INR: INR: 1.9 — AB (ref 2.0–3.0)

## 2018-11-12 LAB — PROTIME-INR
INR: 2 — ABNORMAL HIGH (ref 0.8–1.2)
Prothrombin Time: 19.9 s — ABNORMAL HIGH (ref 9.1–12.0)

## 2018-11-12 NOTE — Patient Instructions (Signed)
Description   Take 7.5 mg today then change dose to  2.5 mg daily except for 5 mg on Tuesdays  Thursdays and Saturdays. Please call our office with any medication changes or concerns [phone]. Return in 2 weeks for INR check.

## 2018-11-12 NOTE — Telephone Encounter (Signed)
Spoke with Mr. Ingwersen, will mail enrollment form to his home and he will send back to [place].   Ruta Hinds. Velva Harman, PharmD, BCPS, CPP Clinical Pharmacist Phone: [phone] 11/12/2018 10:35 AM

## 2018-11-13 DIAGNOSIS — I110 Hypertensive heart disease with heart failure: Secondary | ICD-10-CM | POA: Diagnosis not present

## 2018-11-13 LAB — BASIC METABOLIC PANEL
BUN/Creatinine Ratio: 24 (ref 10–24)
BUN: 30 mg/dL — ABNORMAL HIGH (ref 8–27)
CO2: 22 mmol/L (ref 20–29)
Calcium: 9.9 mg/dL (ref 8.6–10.2)
Chloride: 98 mmol/L (ref 96–106)
Creatinine, Ser: 1.25 mg/dL (ref 0.76–1.27)
GFR calc Af Amer: 67 mL/min/{1.73_m2} (ref >59)
GFR calc non Af Amer: 58 mL/min/{1.73_m2} — ABNORMAL LOW (ref >59)
Glucose: 97 mg/dL (ref 65–99)
Potassium: 3.9 mmol/L (ref 3.5–5.2)
Sodium: 135 mmol/L (ref 134–144)

## 2018-11-29 ENCOUNTER — Ambulatory Visit (INDEPENDENT_AMBULATORY_CARE_PROVIDER_SITE_OTHER): Payer: Medicare Other | Admitting: Cardiology

## 2018-11-29 ENCOUNTER — Ambulatory Visit (INDEPENDENT_AMBULATORY_CARE_PROVIDER_SITE_OTHER): Payer: Medicare Other | Admitting: Pharmacist

## 2018-11-29 ENCOUNTER — Encounter: Payer: Self-pay | Admitting: Cardiology

## 2018-11-29 ENCOUNTER — Encounter (INDEPENDENT_AMBULATORY_CARE_PROVIDER_SITE_OTHER): Payer: Medicare Other

## 2018-11-29 VITALS — BP 114/68 | HR 64 | Ht 68.0 in | Wt 154.4 lb

## 2018-11-29 DIAGNOSIS — Z7901 Long term (current) use of anticoagulants: Secondary | ICD-10-CM

## 2018-11-29 DIAGNOSIS — I272 Pulmonary hypertension, unspecified: Secondary | ICD-10-CM

## 2018-11-29 DIAGNOSIS — I110 Hypertensive heart disease with heart failure: Secondary | ICD-10-CM | POA: Diagnosis not present

## 2018-11-29 DIAGNOSIS — Z952 Presence of prosthetic heart valve: Secondary | ICD-10-CM | POA: Diagnosis not present

## 2018-11-29 DIAGNOSIS — I4892 Unspecified atrial flutter: Secondary | ICD-10-CM

## 2018-11-29 DIAGNOSIS — I25119 Atherosclerotic heart disease of native coronary artery with unspecified angina pectoris: Secondary | ICD-10-CM

## 2018-11-29 DIAGNOSIS — E785 Hyperlipidemia, unspecified: Secondary | ICD-10-CM

## 2018-11-29 LAB — POCT INR: INR: 3.3 — AB (ref 2.0–3.0)

## 2018-11-29 NOTE — Progress Notes (Signed)
nicole called from dr Bettina Gavia to report inr 3.3

## 2018-11-29 NOTE — Progress Notes (Signed)
Cardiology Office Note:    Date:  11/29/2018   ID:  Lance Hicks, DOB 10-27-1946, MRN [id number]  PCP:  Townsend Roger, MD  Cardiologist:  Shirlee More, MD    Referring MD: Nona Dell, Corene Cornea, MD    ASSESSMENT:    1. [name]   2. Atrial flutter, unspecified type ([place])   3. Hypertensive heart disease with heart failure ([place])   4. Coronary artery disease involving native coronary artery of native heart with angina pectoris ([place])   5. Pulmonary hypertension ([place])   6. Chronic anticoagulation   7. Hyperlipidemia, unspecified hyperlipidemia type    PLAN:    In order of problems listed above:  1. Stable continue anticoagulation GATA-3 device clinic goal INR is 3.0 2. Stable rhythm asymptomatic rate controlled 3. Improved adequate blood pressure on midodrine continue the same heart failure is compensated continue his current diuretic 4. Stable New York Heart Association class I continue current medical treatment he has had no angina is return to his previous pattern of regular exercise 5. Severe managed in our heart failure program 6. Continue warfarin management device clinic we checked INR today and adjusted anticoagulation 7. Stable continue statin LDL is at target   Next appointment: 3 months   Medication Adjustments/Labs and Tests Ordered: Current medicines are reviewed at length with the patient today.  Concerns regarding medicines are outlined above.  No orders of the defined types were placed in this encounter.  No orders of the defined types were placed in this encounter.   Chief Complaint  Patient presents with  . Follow-up    also has [place] intoolerant of therapy with dr bensimon  . Anticoagulation    mechanical AVR  . Atrial Fibrillation  . Congestive Heart Failure  . Hypotension    History of Present Illness:     Lance Hicks is a 72 y.o. male with a hx of permanent atrial fibrillation coronary artery disease aortic  stenosis CABG aortic valve replacement #25 carbo metrics valve 2010 Maze procedure and left atrial appendage excision and pulmonary artery hypertension.  He has also has had a significant decline in GFR with CKD and symptomatic hypotension last seen 07/23/18. Compliance with diet, lifestyle and medications: Yes Past Medical History:  Diagnosis Date  . 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 In light of concurrent left nephrolithiasis, will check CT renal colic next year instead of renal US.   [name] Anticoagulated on Coumadin 01/04/2018  . Anxiety 05/10/2016   Last Assessment & Plan:  Doing well off of zoloft.  . Asymptomatic microscopic hematuria 06/20/2017   Last Assessment & Plan:  Had hematuria [facility]up [facility] in 2016 which negative CT and cystoscopy. UA with 2+ blood last visit - we discussed recommendation for repeat workup at 5 years or if degree of hematuria progresses.   . Atrial fibrillation ([place]) 03/29/2017  . Atrial flutter ([place]) 03/29/2017  . Chronic allergic rhinitis 04/28/2016   Last Assessment & Plan:  Continue astelin  . Chronic anticoagulation 03/29/2017  . Chronic atrial fibrillation 07/23/2015   Last Assessment & Plan:  Coumadin and metoprolol, cardiology referral to establish care.  . Chronic midline back pain 12/14/2015   Last Assessment & Plan:  Pain management referral for further evaluation.  . Chronic prostatitis 07/23/2015   Last Assessment & Plan:  Has largely resolved since stopping bike riding. Recommend annual DRE AND PSA - will  see back 12/2015 for annual screening, given 1st degree fhx. To call office for recurrent prostatitis symptoms.   [name] COPD (chronic obstructive pulmonary disease) ([place]) 06/12/2016  . Coronary artery disease involving native coronary artery of native heart with angina pectoris ([place]) 03/29/2017  . Cough 10/17/2016   Last Assessment & Plan:  Discussed typical course for acute viral illness. If symptoms  worsen or fail to improve by 7-10d, delayed ATBs, fluids, rest, NSAIDs/APAP prn. Seek care if not improving. Needs earlier INR check due to ATBs.  [name] Dyspnea 02/01/2016   Last Assessment & Plan:  Overall improving, eval by pulm, plan for CT, neg stress test with cardiology. Recent switch to carvedilol due to side effects.  . Epidermoid cyst of skin 08/24/2017  . Essential hypertension 12/14/2015   Last Assessment & Plan:  Hypertension control: controlled  Medications: compliant Medication Management: as noted in orders Home blood pressure monitoring recommended additionally as needed for symptoms  The patient's care plan was reviewed and updated. Instructions and counseling were provided regarding patient goals and barriers. He was counseled to adopt a healthy lifestyle. Educational resources and self-management tools have been provided as charted in [facility] list.   . H/O maze procedure 03/29/2017  . [name] 03/29/2017   Overview:  2011  . Hx of CABG 03/29/2017  . Hyperlipidemia 03/29/2017  . Hypertensive heart disease 03/29/2017  . Kidney stones 07/23/2015   Overview:  x 3  Last Assessment & Plan:  By [place] has left nephrolithiasis, but not visible by KUB. Will check CT renal colic next year to assess both stone burden as well as to surveil AML.   . Leukocytoclastic vasculitis ([place]) 10/01/2017  . Localized edema 01/04/2018  . Lumbar radicular pain 01/19/2016  . Maculopapular rash 09/03/2017  . Nephrolithiasis 07/23/2015   Overview:  x 3  Last Assessment & Plan:  By [place] has left nephrolithiasis, but not visible by KUB. Will check CT renal colic next year to assess both stone burden as well as to surveil AML.   Overview:  x 3  Last Assessment & Plan:  Has 69m nonobstructing LUP stone - not visible by KUB.  Will check renal [place] - he will contact office sooner if symptomatic.   . Non-sustained ventricular tachycardia ([place] 03/29/2017  . Other hyperlipidemia 03/29/2017  . Palpitations  10/01/2017  . Paroxysmal atrial fibrillation ([place] 03/29/2017  . Paroxysmal atrial fibrillation ([place] 03/29/2017  . Pleural effusion, bilateral 08/09/2017  . Post-nasal drainage 02/25/2016   Last Assessment & Plan:  Trial zyrtec and flonase  . Prostate cancer screening 06/20/2017   Last Assessment & Plan:  Recommend continued annual CaP screening until within 10 years of life expectancy. Given good health and fhx of longevity, would anticipate CaP screening to continue until age 72  PSA today and again in one year on day of visit.  . Pulmonary hypertension ([place] 08/09/2017  . Pulmonary nodules 06/12/2016  . S/P AVR (aortic valve replacement) 03/20/2016  . Supratherapeutic INR 07/26/2017  . Syncope 03/29/2017  . Syncope and collapse 02/01/2016  . Typical atrial flutter ([place] 02/01/2016    Past Surgical History:  Procedure Laterality Date  . CHOLECYSTECTOMY    . CORONARY ARTERY BYPASS GRAFT    . EXPLORATORY LAPAROTOMY  07/30/2017  . FOOT SURGERY    . FRACTURE SURGERY Right    wrist and forearm  . HERNIA REPAIR    . MECHANICAL AORTIC VALVE REPLACEMENT    . NASAL SINUS SURGERY    .  RIGHT HEART CATH N/A 01/18/2018   Procedure: RIGHT HEART CATH;  Surgeon: Jolaine Artist, MD;  Location: [facility];  Service: Cardiovascular;  Laterality: N/A;  . RIGHT HEART CATH N/A 05/14/2018   Procedure: RIGHT HEART CATH;  Surgeon: Jolaine Artist, MD;  Location: [place] [facility];  Service: Cardiovascular;  Laterality: N/A;  . TEE WITHOUT CARDIOVERSION N/A 05/21/2018   Procedure: TRANSESOPHAGEAL ECHOCARDIOGRAM (TEE);  Surgeon: Jolaine Artist, MD;  Location[facility] ENDOSCOPY;  Service: Cardiovascular;  Laterality: N/A;  . UPPER GASTROINTESTINAL ENDOSCOPY  07/12/2017   Patchy areas of mucosal inflammation noted in the antrum with edema,erthema and ulcerations. Bx. Chronicfocally active gastritis.  [name] VASECTOMY      Current Medications: Current Meds  Medication Sig  . acetaminophen (TYLENOL) 650 MG CR  tablet Take 650 mg by mouth every 8 (eight) hours as needed for pain.  [name] albuterol (PROAIR HFA) 108 (90 Base) MCG/ACT inhaler Inhale 2 puffs into the lungs every 4 (four) hours as needed for wheezing or shortness of breath.  [name] amoxicillin (AMOXIL) 500 MG capsule Take 4 capsules 30-60 minutes prior to dental cleaning  . aspirin EC 81 MG tablet Take 81 mg by mouth daily.  . B Complex-C (B-COMPLEX WITH VITAMIN C) tablet Take 1 tablet by mouth daily.   . cetirizine (ZYRTEC ALLERGY) 10 MG tablet Take 10 mg by mouth daily as needed for allergies.   [name] ENSURE (ENSURE) Take 237 mLs by mouth every other day.  . macitentan (OPSUMIT) 10 MG tablet Take 1 tablet (10 mg total) by mouth daily.  . midodrine (PROAMATINE) 10 MG tablet Take 1 tablet (10 mg total) by mouth 3 (three) times daily with meals.  . multivitamin-lutein (OCUVITE-LUTEIN) CAPS capsule Take 1 capsule by mouth daily.  . pantoprazole (PROTONIX) 40 MG tablet Take 1 tablet (40 mg total) by mouth daily.  . potassium chloride (K-DUR) 10 MEQ tablet Take 2 tablets (20 mEq total) by mouth daily.  [name] spironolactone (ALDACTONE) 25 MG tablet Take 0.5 tablets (12.5 mg total) by mouth daily.  . tadalafil, PAH, (ADCIRCA) 20 MG tablet Take 2 tablets (40 mg total) by mouth daily.  [name] torsemide (DEMADEX) 20 MG tablet Take 20 mg by mouth 2 (two) times daily.  [name] warfarin (COUMADIN) 5 MG tablet As directed by coumadin clinic     Allergies:   Amoxicillin-pot clavulanate and Tape   Social History   Socioeconomic History  . Marital status: Single    Spouse name: Not on file  . Number of children: Not on file  . Years of education: Not on file  . Highest education level: Not on file  Occupational History  . Not on file  Social Needs  . Financial resource strain: Not on file  . Food insecurity:    Worry: Not on file    Inability: Not on file  . Transportation needs:    Medical: Not on file    Non-medical: Not on file  Tobacco Use  . Smoking status:  Former Smoker    Packs/day: 2.00    Years: 34.00    Pack years: 68.00    Types: Cigarettes    Last attempt to quit: 07/16/2000    Years since quitting: 18.3  . Smokeless tobacco: Never Used  Substance and Sexual Activity  . Alcohol use: Yes  . Drug use: No  . Sexual activity: Not on file  Lifestyle  . Physical activity:    Days per week: Not on file  Minutes per session: Not on file  . Stress: Not on file  Relationships  . Social connections:    Talks on phone: Not on file    Gets together: Not on file    Attends religious service: Not on file    Active member of club or organization: Not on file    Attends meetings of clubs or organizations: Not on file    Relationship status: Not on file  Other Topics Concern  . Not on file  Social History Narrative  . Not on file     Family History: The patient's family history includes Arthritis in his mother; Asthma in his mother; Heart attack in his father; Hypertension in his father; Stroke in his paternal grandmother. There is no history of Colon cancer. ROS:   Please see the history of present illness.    All other systems reviewed and are negative.  EKGs/Labs/Other Studies Reviewed:    The following studies were reviewed today:  EKG:  EKG ordered today.  The ekg ordered today demonstrates atrial flutter atypical controlled ventricular rate this is his chronic rhythm  Recent Labs: 05/15/2018: TSH 2.768 05/16/2018: Magnesium 1.8 05/18/2018: ALT 13 07/23/2018: Hemoglobin 12.5; Platelets 283 10/02/2018: B Natriuretic Peptide 206.0 11/13/2018: BUN 30; Creatinine, Ser 1.25; Potassium 3.9; Sodium 135  Recent Lipid Panel    Component Value Date/Time   CHOL 125 10/21/2018 1028   TRIG 57 10/21/2018 1028   HDL 43 10/21/2018 1028   CHOLHDL 2.9 10/21/2018 1028   VLDL 11 10/21/2018 1028   LDLCALC 71 10/21/2018 1028    Physical Exam:    VS:  BP 114/68 (BP Location: Right Arm, Patient Position: Sitting, Cuff Size: Normal)   Pulse  64   Ht _0  (1.727 m)   Wt 154 lb 6.4 oz (70 kg)   SpO2 94%   BMI 23.48 kg/m     Wt Readings from Last 3 Encounters:  11/29/18 154 lb 6.4 oz (70 kg)  10/02/18 152 lb 6.4 oz (69.1 kg)  09/05/18 147 lb 12.8 oz (67 kg)     GEN:  Well nourished, well developed in no acute distress HEENT: Normal NECK: No JVD; No carotid bruits LYMPHATICS: No lymphadenopathy CARDIAC: Irr Irr  Variable s1 RRR, no murmurs, rubs, gallops RESPIRATORY:  Clear to auscultation without rales, wheezing no AR sharp CS of AVR ABDOMEN: Soft, non-tender, non-distended MUSCULOSKELETAL:  No edema; No deformity  SKIN: Warm and dry NEUROLOGIC:  Alert and oriented x 3 PSYCHIATRIC:  Normal affect    Signed, Shirlee More, MD  11/29/2018 10:17 AM    [place]

## 2018-11-29 NOTE — Patient Instructions (Signed)
Medication Instructions:  Your physician has recommended you make the following change in your medication:   START over the counter Quinine as needed for muscle cramps  If you need a refill on your cardiac medications before your next appointment, please call your pharmacy.   Lab work: None  If you have labs (blood work) drawn today and your tests are completely normal, you will receive your results only by: [name] MyChart Message (if you have MyChart) OR . A paper copy in the mail If you have any lab test that is abnormal or we need to change your treatment, we will call you to review the results.  Testing/Procedures: You had an EKG today.   Follow-Up: At [facility], you and your health needs are our priority.  As part of our continuing mission to provide you with exceptional heart care, we have created designated Provider Care Teams.  These Care Teams include your primary Cardiologist (physician) and Advanced Practice Providers (APPs -  Physician Assistants and Nurse Practitioners) who all work together to provide you with the care you need, when you need it. You will need a follow up appointment in 3 months.  Please call our office 2 months in advance to schedule this appointment.

## 2018-12-03 ENCOUNTER — Telehealth (HOSPITAL_COMMUNITY): Payer: Self-pay | Admitting: Pharmacist

## 2018-12-03 ENCOUNTER — Encounter (HOSPITAL_COMMUNITY): Payer: Self-pay | Admitting: Internal Medicine

## 2018-12-03 ENCOUNTER — Ambulatory Visit (HOSPITAL_COMMUNITY)
Admission: RE | Admit: 2018-12-03 | Discharge: 2018-12-03 | Disposition: A | Discharge status: Home / Self Care | Payer: Medicare Other | Source: Ambulatory Visit | Attending: Internal Medicine | Admitting: Internal Medicine

## 2018-12-03 VITALS — BP 130/72 | HR 51 | Wt 151.0 lb

## 2018-12-03 DIAGNOSIS — I482 Chronic atrial fibrillation, unspecified: Secondary | ICD-10-CM | POA: Insufficient documentation

## 2018-12-03 DIAGNOSIS — J449 Chronic obstructive pulmonary disease, unspecified: Secondary | ICD-10-CM | POA: Diagnosis not present

## 2018-12-03 DIAGNOSIS — I10 Essential (primary) hypertension: Secondary | ICD-10-CM | POA: Diagnosis not present

## 2018-12-03 DIAGNOSIS — Z87891 Personal history of nicotine dependence: Secondary | ICD-10-CM | POA: Insufficient documentation

## 2018-12-03 DIAGNOSIS — K7460 Unspecified cirrhosis of liver: Secondary | ICD-10-CM | POA: Insufficient documentation

## 2018-12-03 DIAGNOSIS — I2510 Atherosclerotic heart disease of native coronary artery without angina pectoris: Secondary | ICD-10-CM | POA: Diagnosis not present

## 2018-12-03 DIAGNOSIS — I272 Pulmonary hypertension, unspecified: Secondary | ICD-10-CM | POA: Insufficient documentation

## 2018-12-03 DIAGNOSIS — Z9049 Acquired absence of other specified parts of digestive tract: Secondary | ICD-10-CM | POA: Insufficient documentation

## 2018-12-03 DIAGNOSIS — J9610 Chronic respiratory failure, unspecified whether with hypoxia or hypercapnia: Secondary | ICD-10-CM | POA: Insufficient documentation

## 2018-12-03 DIAGNOSIS — Z9889 Other specified postprocedural states: Secondary | ICD-10-CM | POA: Insufficient documentation

## 2018-12-03 DIAGNOSIS — L959 Vasculitis limited to the skin, unspecified: Secondary | ICD-10-CM | POA: Diagnosis not present

## 2018-12-03 DIAGNOSIS — Z7982 Long term (current) use of aspirin: Secondary | ICD-10-CM | POA: Insufficient documentation

## 2018-12-03 DIAGNOSIS — Z880 Allergy status to penicillin: Secondary | ICD-10-CM | POA: Insufficient documentation

## 2018-12-03 DIAGNOSIS — Z7901 Long term (current) use of anticoagulants: Secondary | ICD-10-CM | POA: Diagnosis not present

## 2018-12-03 DIAGNOSIS — I480 Paroxysmal atrial fibrillation: Secondary | ICD-10-CM | POA: Insufficient documentation

## 2018-12-03 DIAGNOSIS — I4892 Unspecified atrial flutter: Secondary | ICD-10-CM | POA: Diagnosis not present

## 2018-12-03 DIAGNOSIS — I2781 Cor pulmonale (chronic): Secondary | ICD-10-CM | POA: Diagnosis not present

## 2018-12-03 DIAGNOSIS — M791 Myalgia, unspecified site: Secondary | ICD-10-CM | POA: Insufficient documentation

## 2018-12-03 DIAGNOSIS — Z951 Presence of aortocoronary bypass graft: Secondary | ICD-10-CM | POA: Insufficient documentation

## 2018-12-03 DIAGNOSIS — Z888 Allergy status to other drugs, medicaments and biological substances status: Secondary | ICD-10-CM | POA: Diagnosis not present

## 2018-12-03 DIAGNOSIS — Z952 Presence of prosthetic heart valve: Secondary | ICD-10-CM | POA: Insufficient documentation

## 2018-12-03 DIAGNOSIS — M25511 Pain in right shoulder: Secondary | ICD-10-CM | POA: Insufficient documentation

## 2018-12-03 DIAGNOSIS — I350 Nonrheumatic aortic (valve) stenosis: Secondary | ICD-10-CM | POA: Insufficient documentation

## 2018-12-03 MED ORDER — ROSUVASTATIN CALCIUM 5 MG PO TABS: 5.0000 mg | ORAL_TABLET | Freq: Every day | ORAL | 6 refills | Status: DC

## 2018-12-03 NOTE — Telephone Encounter (Signed)
Uptravi 200/800 mcg PA approved by [employer] through 12/02/21.   Ruta Hinds. Velva Harman, PharmD, BCPS, CPP Clinical Pharmacist Phone: [phone] 12/03/2018 10:33 AM

## 2018-12-03 NOTE — Patient Instructions (Signed)
START Crestor 65m (1 tab) daily.  A prescription was sent to your pharmacy.  Your physician recommends that you schedule a follow-up appointment in: 3 months.  Do the following things EVERYDAY: 1) Weigh yourself in the morning before breakfast. Write it down and keep it in a log. 2) Take your medicines as prescribed 3) Eat low salt foods-Limit salt (sodium) to 2000 mg per day.  4) Stay as active as you can everyday 5) Limit all fluids for the day to less than 2 liters

## 2018-12-03 NOTE — Progress Notes (Addendum)
6 min walk: Ambulation distance 548 meters  Pt HR 73 and O2 Room air 95% at start of walk.  During walk HR range 80-119, O2 Room air range 74-93%.  End of walk pt HR was 114 O2 74%. At no point patient endorsed dizziness, lightheadedness or SOB.  Pt declined O2 to assist with recovery.  After 3 minutes of sitting, patient recovered to 92% O2 on room air HR 67. Pt reports feeling well.

## 2018-12-03 NOTE — Progress Notes (Signed)
ADVANCED HF CLINIC NOTE   Date:  12/03/2018   ID:  [name], Lukas 1946/03/02, MRN [id number]  PCP:  Townsend Roger, MD  Cardiologist:  Dr. 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 Also has COPD (quit smoking in 2002 - 1 ppd x 25 years), HL, and HTN.  In 8/18 admitted for acute pancreatitis. Underwent lap cholecystectomy on 07/29/2017. On 8/6 hedeveloped hypotension and was taken back for open laparotomy due to bleeding from an unclear omental source.He was given fluids and 8 units PRBC perioperatively. He says his breathing has been bad since that time.   Seen at [facility] in 2018 had hi-res CT, PFTs and VQ (low prob). Felt to have Golds I COPD with asthma overlap and early pulmonary fibrosis with PAH. Thought felt pulmonary pressures may have been elevated due to recent illness. Referred to Rheumatology though appt not until May. Suggested possible RHC.   He was also seen by Cardiology. Echocardiogram 8/18 as below showed normal LV function with septal flattening and RVSP 65-51mHG. There was no comment on RV size or function. 14-day event monitor showed 10-beat run NSVT. Felt to have chronic AF and PAH. No further testing ordered.  Underwent RHC on 01/18/18 with severe PAH and cor pulmonale. Macitentan and Adcirca.   Admitted 5/21-5/30/19 with symptomatic hypotension and weight gain. He had a repeat RHC and echo with bubble study (results below). He was started on midodrine for BP support. Diuresed 25 lbs with IV lasix, then transitioned to torsemide 60 mg daily. Abdominal [place]owed cirrhosis with no evidence of portal HTN. Pulm consulted for possible ILD on High-res chest CT. Ortho consulted for right rotator cuff tear from horse  accident PTA. He was treated with antibiotics for Bacteremia. DC weight: 140 lbs.   He presents today for regular follow up. Remains on Adcirca and macitentan. At last visit we ordered selexipag. It has been approved but he hasn't received it yet. Unable to tolerate atorvastatin due to leg cramps. Feeling much better. Does TM 3-4x/week. Feels great on it. Yesterday did over 2 miles in < 35 minutes. No CP or SOB. Not wearing O2. Not checking sats. Volume status well controlled on torsemide 40 daily.  Takes an extra 239mabout once a week.   Review of systems complete and found to be negative unless listed in HPI.   Studies:  RHC 5/21/19showed Mild/Moderate PAH in setting of high output with no evidence of intracardiac shunting. Hemodynamics as below.   High-Res Chest CT 05/16/18 1. Slightly irregular 0.9 cm peripheral right middle lobe solid pulmonary nodule. 3 month follow up recommended.  2. Dependent basilar predominant patchy subpleural reticulation and ground-glass attenuation with the suggestion of minimal associated traction bronchiolectasis. No frank honeycombing. Findings may indicate an interstitial lung disease such as early usual interstitial pneumonia (UIP) or fibrotic phase nonspecific interstitial pneumonia (NSIP). Suggest a follow-up high-resolution chest CT study in 6-12 months to assess temporal pattern stability, as clinically warranted. 3. Mild cardiomegaly. Minimal interlobular septal thickening and trace dependent bilateral pleural effusions.  4. Small to moderate volume perihepatic ascites. 5. Ectatic 4.3 cm ascending thoracic aorta. Recommend annual imaging followup by CTA or MRA.  6. Left  main and 3 vessel coronary atherosclerosis.  US Abdomen RUQ 05/16/18 - s/p cholecystectomy. + hepatic cirrhosis. Mild ascites.   Liver Doppler 05/16/18 - No hepatic, splenic, or portal venous thrombosis or occlusion. Mild ascites.  Echo with bubble study 05/16/18 LVEF  55-60% with "late bubbles" , Mechanical AVR stable with trivial perivalvular regurg, Mild MR, Severe LAE, Severe RV dilation and reduced function. No PFO. Mod TR, PA peak pressure 68 mm Hg  Swan numbers5/24/19: PA 60-19 (32) CVP ~10 CO 5.4 CI 2.9  RHC 05/14/18 RA = 18 RV = 71/17 PA = 69/24 (39) PCW = 20 Fick cardiac output/index = 7.0/3.7 Thermo CO/CI = 7.2/3.8 PVR = 2.6 WU Ao sat = 97% PA sat = 73%, 73% SVC sat = 73%  RA sat = 72%  RHC 01/18/18 RA = 23 RV = 84/21 PA = 81/38 (53) PCW = 22 Fick cardiac output/index = 3.0/1.6 PVR = 10.4 WU FA sat = 98% on 2L  (checked on RA = 93%) PA sat = 60%, 61% SVC sat = 62%   Echo 8/18  1. LVEF is estimated to be 55%. 2. There is septal flattening consistent with right ventricular pressure and or volume overload. 3. A mechanical prosthetic aortic valve is present with mild AI 4. There is moderate to severe tricuspid regurgitation. 5. There is a trivial amount of pulmonic regurgitation. 6. The right ventricular systolic pressure is calculated at 65-38mHg. 7. The inferior vena cava is dilated with <50% inspiratory collapse, suggestive of an elevated right atrial pressure (178mg).  14 Day Patch 10/01/2017-10/15/2017:  -1 run of Ventricular Tachycardia occurred lasting 10 beats with a max rate of 240 bpm (avg 161 bpm). -Atrial Flutter occurred continuously (100% burden), ranging from 55-132 bpm (avg of 87 bpm).   NM Ventilation and Perfusion Lung Scan 10/31/2017:  Low probability of pulmonary embolism, using the PIOPED criteria.  Lower extremity venous duplex 07/25/2017:  1. Low probability of acute deep vein thrombosis in bilateral lower extremities. 2. Calf veins are poorly visualized but appear patent in limited segments visualized.   PFTs 07/02/2017:  FVC86% FEV185% FEV1/FVC 73% DLCO8.2, 32%   6MW -1400 feet; 97% to 85% (recovered with resting)   CT  Chest07/08/2017: 1. Stable right middle lobe pulmonary nodule measuring up to 1 cm. 2. Minimal subpleural reticulation without evidence of honeycombing to  suggest pulmonary fibrosis. Stable centrilobular emphysema. 3. Mediastinal lymphadenopathy, increased from prior. 4. Calcified right hilar lymph nodes and multiple calcified granulomas  throughout the right lung consistent with prior granulomatous disease. 5. Ascending thoracic aortic aneurysm measuring up to 4.5 cm, unchanged. 6. Dilatation of the main pulmonary artery suggestive of pulmonary  hypertension. 7. Cardiomegaly and dense atherosclerotic coronary artery calcifications.    Past Medical History:  Diagnosis Date  . 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 In light of concurrent left nephrolithiasis, will check CT renal colic next year instead of renal [place]  . Marland Kitchennticoagulated on Coumadin 01/04/2018  . Anxiety 05/10/2016   Last Assessment & Plan:  Doing well off of zoloft.  . Asymptomatic microscopic hematuria 06/20/2017   Last Assessment & Plan:  [facility] and [facility]oscopy. UA with 2+ blood last visit - we discussed recommendation for repeat workup at 5 years or if degree of hematuria progresses.   . Atrial fibrillation (HCP[place]4/04/2017  . Atrial flutter (H[place]4/04/2017  . Chronic allergic rhinitis  04/28/2016   Last Assessment & Plan:  Continue astelin  . Chronic anticoagulation 03/29/2017  . Chronic atrial fibrillation 07/23/2015   Last Assessment & Plan:  Coumadin and metoprolol, cardiology referral to establish care.  . Chronic midline back pain 12/14/2015   Last Assessment & Plan:  Pain management referral for further evaluation.  . Chronic prostatitis 07/23/2015   Last Assessment & Plan:  Has largely resolved since stopping bike riding. Recommend annual DRE AND PSA - will see back 12/2015 for annual screening, given 1st degree fhx.  To call office for recurrent prostatitis symptoms.   [name] COPD (chronic obstructive pulmonary disease) ([place]) 06/12/2016  . Coronary artery disease involving native coronary artery of native heart with angina pectoris ([place]) 03/29/2017  . Cough 10/17/2016   Last Assessment & Plan:  Discussed typical course for acute viral illness. If symptoms worsen or fail to improve by 7-10d, delayed ATBs, fluids, rest, NSAIDs/APAP prn. Seek care if not improving. Needs earlier INR check due to ATBs.  [name] Dyspnea 02/01/2016   Last Assessment & Plan:  Overall improving, eval by pulm, plan for CT, neg stress test with cardiology. Recent switch to carvedilol due to side effects.  . Epidermoid cyst of skin 08/24/2017  . Essential hypertension 12/14/2015   Last Assessment & Plan:  Hypertension control: controlled  Medications: compliant Medication Management: as noted in orders Home blood pressure monitoring recommended additionally as needed for symptoms  The patient's care plan was reviewed and updated. Instructions and counseling were provided regarding patient goals and barriers. He was counseled to adopt a healthy lifestyle. Educational resources and self-management tools have been provided as charted in [facility] list.   . H/O maze procedure 03/29/2017  . [name] 03/29/2017   Overview:  2011  . Hx of CABG 03/29/2017  . Hyperlipidemia 03/29/2017  . Hypertensive heart disease 03/29/2017  . Kidney stones 07/23/2015   Overview:  x 3  Last Assessment & Plan:  By [place] has left nephrolithiasis, but not visible by KUB. Will check CT renal colic next year to assess both stone burden as well as to surveil AML.   . Leukocytoclastic vasculitis ([place]) 10/01/2017  . Localized edema 01/04/2018  . Lumbar radicular pain 01/19/2016  . Maculopapular rash 09/03/2017  . Nephrolithiasis 07/23/2015   Overview:  x 3  Last Assessment & Plan:  By [place] has left nephrolithiasis, but not visible by KUB. Will check CT renal colic next year to  assess both stone burden as well as to surveil AML.   Overview:  x 3  Last Assessment & Plan:  Has 2m nonobstructing LUP stone - not visible by KUB.  Will check renal [place]2019 - he will contact office sooner if symptomatic.   . Non-sustained ventricular tachycardia ([place] 03/29/2017  . Other hyperlipidemia 03/29/2017  . Palpitations 10/01/2017  . Paroxysmal atrial fibrillation ([place] 03/29/2017  . Paroxysmal atrial fibrillation ([place] 03/29/2017  . Pleural effusion, bilateral 08/09/2017  . Post-nasal drainage 02/25/2016   Last Assessment & Plan:  Trial zyrtec and flonase  . Prostate cancer screening 06/20/2017   Last Assessment & Plan:  Recommend continued annual CaP screening until within 10 years of life expectancy. Given good health and fhx of longevity, would anticipate CaP screening to continue until age 89  PSA today and again in one year on day of visit.  . Pulmonary hypertension ([place] 08/09/2017  . Pulmonary nodules 06/12/2016  . S/P AVR (aortic valve replacement) 03/20/2016  . Supratherapeutic INR 07/26/2017  .  Syncope 03/29/2017  . Syncope and collapse 02/01/2016  . Typical atrial flutter ([place]) 02/01/2016    Past Surgical History:  Procedure Laterality Date  . CHOLECYSTECTOMY    . CORONARY ARTERY BYPASS GRAFT    . EXPLORATORY LAPAROTOMY  07/30/2017  . FOOT SURGERY    . FRACTURE SURGERY Right    wrist and forearm  . HERNIA REPAIR    . MECHANICAL AORTIC VALVE REPLACEMENT    . NASAL SINUS SURGERY    . RIGHT HEART CATH N/A 01/18/2018   Procedure: RIGHT HEART CATH;  Surgeon: Jolaine Artist, MD;  Location: [facility];  Service: Cardiovascular;  Laterality: N/A;  . RIGHT HEART CATH N/A 05/14/2018   Procedure: RIGHT HEART CATH;  Surgeon: Jolaine Artist, MD;  Location: [facility];  Service: Cardiovascular;  Laterality: N/A;  . TEE WITHOUT CARDIOVERSION N/A 05/21/2018   Procedure: TRANSESOPHAGEAL ECHOCARDIOGRAM (TEE);  Surgeon: Jolaine Artist, MD;  [facility] END[facility]COPY;   Service: Cardiovascular;  Laterality: N/A;  . UPPER GASTROINTESTINAL ENDOSCOPY  07/12/2017   Patchy areas of mucosal inflammation noted in the antrum with edema,erthema and ulcerations. Bx. Chronicfocally active gastritis.  [name] VASECTOMY      Current Medications: Current Meds  Medication Sig  . acetaminophen (TYLENOL) 650 MG CR tablet Take 650 mg by mouth every 8 (eight) hours as needed for pain.  [name] albuterol (PROAIR HFA) 108 (90 Base) MCG/ACT inhaler Inhale 2 puffs into the lungs every 4 (four) hours as needed for wheezing or shortness of breath.  [name] aspirin EC 81 MG tablet Take 81 mg by mouth daily.  . B Complex-C (B-COMPLEX WITH VITAMIN C) tablet Take 1 tablet by mouth daily.   . cetirizine (ZYRTEC ALLERGY) 10 MG tablet Take 10 mg by mouth daily as needed for allergies.   [name] ENSURE (ENSURE) Take 237 mLs by mouth every other day.  . macitentan (OPSUMIT) 10 MG tablet Take 1 tablet (10 mg total) by mouth daily.  . midodrine (PROAMATINE) 10 MG tablet Take 1 tablet (10 mg total) by mouth 3 (three) times daily with meals.  . multivitamin-lutein (OCUVITE-LUTEIN) CAPS capsule Take 1 capsule by mouth daily.  . pantoprazole (PROTONIX) 40 MG tablet Take 1 tablet (40 mg total) by mouth daily.  . potassium chloride (K-DUR) 10 MEQ tablet Take 2 tablets (20 mEq total) by mouth daily.  . Selexipag (UPTRAVI) 200 MCG TABS Take 200 mcg by mouth 2 (two) times daily.  [name] spironolactone (ALDACTONE) 25 MG tablet Take 0.5 tablets (12.5 mg total) by mouth daily.  . tadalafil, PAH, (ADCIRCA) 20 MG tablet Take 2 tablets (40 mg total) by mouth daily.  [name] torsemide (DEMADEX) 20 MG tablet Take 20 mg by mouth 2 (two) times daily.  [name] warfarin (COUMADIN) 5 MG tablet As directed by coumadin clinic     Allergies:   Amoxicillin-pot clavulanate and Tape   Social History   Socioeconomic History  . Marital status: Single    Spouse name: Not on file  . Number of children: Not on file  . Years of education: Not on file  .  Highest education level: Not on file  Occupational History  . Not on file  Social Needs  . Financial resource strain: Not on file  . Food insecurity:    Worry: Not on file    Inability: Not on file  . Transportation needs:    Medical: Not on file    Non-medical: Not on file  Tobacco Use  .  Smoking status: Former Smoker    Packs/day: 2.00    Years: 34.00    Pack years: 68.00    Types: Cigarettes    Last attempt to quit: 07/16/2000    Years since quitting: 18.3  . Smokeless tobacco: Never Used  Substance and Sexual Activity  . Alcohol use: Yes  . Drug use: No  . Sexual activity: Not on file  Lifestyle  . Physical activity:    Days per week: Not on file    Minutes per session: Not on file  . Stress: Not on file  Relationships  . Social connections:    Talks on phone: Not on file    Gets together: Not on file    Attends religious service: Not on file    Active member of club or organization: Not on file    Attends meetings of clubs or organizations: Not on file    Relationship status: Not on file  Other Topics Concern  . Not on file  Social History Narrative  . Not on file     Family History: The patient's family history includes Arthritis in his mother; Asthma in his mother; Heart attack in his father; Hypertension in his father; Stroke in his paternal grandmother. There is no history of Colon cancer.   Recent Labs: 05/15/2018: TSH 2.768 05/16/2018: Magnesium 1.8 05/18/2018: ALT 13 07/23/2018: Hemoglobin 12.5; Platelets 283 10/02/2018: B Natriuretic Peptide 206.0 11/13/2018: BUN 30; Creatinine, Ser 1.25; Potassium 3.9; Sodium 135  Recent Lipid Panel    Component Value Date/Time   CHOL 125 10/21/2018 1028   TRIG 57 10/21/2018 1028   HDL 43 10/21/2018 1028   CHOLHDL 2.9 10/21/2018 1028   VLDL 11 10/21/2018 1028   LDLCALC 71 10/21/2018 1028    Physical Exam:    VS:  BP 130/72   Pulse (!) 51   Wt 68.5 kg (151 lb)   SpO2 96%   BMI 22.96 kg/m     Wt Readings  from Last 3 Encounters:  12/03/18 68.5 kg (151 lb)  11/29/18 70 kg (154 lb 6.4 oz)  10/02/18 69.1 kg (152 lb 6.4 oz)    General:  Well appearing. No resp difficulty HEENT: normal Neck: supple. JVP 6-7 Carotids 2+ bilat; no bruits. No lymphadenopathy or thryomegaly appreciated. Cor: PMI nondisplaced. Regular rate & rhythm. Mechanical s2 RV lift. Loud p2 Lungs: clear Abdomen: soft, nontender, nondistended. No hepatosplenomegaly. No bruits or masses. Good bowel sounds. Extremities: no cyanosis, clubbing, rash, edema Neuro: alert & orientedx3, cranial nerves grossly intact. moves all 4 extremities w/o difficulty. Affect pleasant    ASSESSMENT/PLAN:    1. Pulmonary hypertension with cor pulmonale/RV failure - Echo with bubble study 05/16/18 LVEF 55-60% with "late bubbles" , Mechanical AVR stable with trivial perivalvular regurg, Mild MR, Severe LAE, Severe RV dilation and reduced function. No PFO. Mod TR, PA peak pressure 68 mm Hg - Echo 10/19: EF 55-60% mild LVH; s/p AVR with mean gradient   11 mmHg and trace AI; mild MR; severe biatrial enlargement; mild   RVE; severe TR; severe pulmonary hypertension. RVSP 66mHG - RHC 1/19 with mod/severe pulm HTN with RV failure.   - RHC 05/14/18 with Mild/Moderate PAH in setting of high output with no evidence of intracardiac shunting. - Ab u/s with cirrhosis but no evidence of portal HTN - Improved with Macitentan and Adcirca. Now NYHA II  - Pending selexipag initiation - Volume status okay on exam.  - Continue torsemide 40 mg per day. Okay to take  extra 20 mg PRN.  - Continue spiro 12.5 mg daily. BMET today.  - Continue midodrine 10 mg am and afternoon, 15 mg at night - Auto-immune serologies negative (checked twice) - ? HHT/shunt/AVMs with late bubbles on bubble study.  - 6MW today-  548 meters O2 Room air range 74-93%.   2. Chronic respiratory failure - [place] CT 05/16/18 with "dependent basilar predominant patchy subpleural reticulation  and ground-glass attenuation with the suggestion of minimal associated traction bronchiolectasis. No frank honeycombing. Findings may indicate an interstitial lung disease such as early usual interstitial pneumonia (UIP) or fibrotic phase nonspecific interstitial pneumonia (NSIP)." - Saw Dr Lamonte Sakai in August. Started on albuterol.  3. Chronic AFL - Rate-controlled. Now off metoprolol. Continue coumadin with mechanical AVR.   4. CAD - s/p CABG 07/2017 with Dr Roderic Palau in [place].  - No ss/s ischemia - continue ASA.  - Unable to tolerate atorva due to myalgias.  - Start Crestor 38m daily  5. S/p mechanical AVR - stable on most recent echo. Continue coumadin/ASA 81. INR goal is 2.5-3 per Dr MBettina Gavia - aware of need for SBE prophylaxis  6. Leukocytoclastic vasculitis - f/u with Rheumatology. No change.  7. Cirrhosis - [place]domen RUQ 05/16/18 - s/p cholecystectomy. + hepatic cirrhosis. Mild ascites.  - Liver Doppler 05/16/18 - No hepatic, splenic, or portal venous thrombosis or occlusion. Mild ascites. - Continue midodrine 10 mg am, afternoon, 15 mg HS  8. R Shoulder pain with rotator cuff tear - Following with ortho. No surgery yet.   DGlori Bickers MD  11:31 AM

## 2018-12-06 DIAGNOSIS — Z23 Encounter for immunization: Secondary | ICD-10-CM | POA: Diagnosis not present

## 2018-12-10 NOTE — Progress Notes (Signed)
  This encounter was created in error - please disregard.

## 2018-12-12 ENCOUNTER — Ambulatory Visit (INDEPENDENT_AMBULATORY_CARE_PROVIDER_SITE_OTHER): Payer: Medicare Other | Admitting: Pharmacist

## 2018-12-12 DIAGNOSIS — Z7901 Long term (current) use of anticoagulants: Secondary | ICD-10-CM

## 2018-12-12 LAB — POCT INR: INR: 2.9 (ref 2.0–3.0)

## 2018-12-12 NOTE — Patient Instructions (Signed)
Description   Continue taking 2.5 mg daily except for 5 mg on Tuesdays, Thursdays and Saturdays. Please call our office with any medication changes or concerns [phone]. Return in 3 weeks for INR check.

## 2018-12-20 ENCOUNTER — Telehealth (HOSPITAL_COMMUNITY): Payer: Self-pay

## 2018-12-20 NOTE — Telephone Encounter (Signed)
[facility] Nursing: Lance Hicks was started at 280mg bid and will be titrated up 2034m weekly until max dose of 160083mis reached. States pt is doing well but is nervous about starting a new medication. Next appoinment is 01/24/2019.

## 2018-12-22 ENCOUNTER — Other Ambulatory Visit (HOSPITAL_COMMUNITY): Payer: Self-pay | Admitting: Internal Medicine

## 2018-12-23 ENCOUNTER — Telehealth: Payer: Self-pay | Admitting: Cardiology

## 2018-12-24 DIAGNOSIS — J4521 Mild intermittent asthma with (acute) exacerbation: Secondary | ICD-10-CM | POA: Diagnosis not present

## 2018-12-24 NOTE — Telephone Encounter (Signed)
Patient reports excruciating leg cramps/pain due to selexipag Malvin Johns). Dr. Haroldine Laws prescribed this medication. Patient has reached out to Dr. Clayborne Dana office three times and cannot get anyone to call him back. Advised patient to contact Dr. Clayborne Dana office again this morning. Will make Dr. Bettina Gavia aware and see what he recommends as well.

## 2018-12-24 NOTE — Telephone Encounter (Signed)
Patient informed to stop taking this medication per Dr. Bettina Gavia. Patient verbalized understanding. No further questions.

## 2018-12-24 NOTE — Telephone Encounter (Signed)
I would stop the med

## 2018-12-27 ENCOUNTER — Telehealth (HOSPITAL_COMMUNITY): Payer: Self-pay

## 2018-12-27 NOTE — Telephone Encounter (Signed)
Pt notified. Verbalizes understanding. Appt made to see DB on 01/13/2019

## 2018-12-27 NOTE — Telephone Encounter (Signed)
Pt called stating that he is having trouble taking his Uptravi 230mg and would like to know if he can decrease it to 1013m. Pt states he is having severe muscle cramps, and insomnia. Please advise.

## 2018-12-27 NOTE — Telephone Encounter (Signed)
OK to stop, as upwards of >15% of patients can have limb and joint pain. Will need to consider alternative medications.  Needs an appointment with Dr. Haroldine Laws sooner than March if available.     Legrand Como [address] [place], PA-C 12/27/2018 12:01 PM

## 2018-12-30 ENCOUNTER — Other Ambulatory Visit (HOSPITAL_COMMUNITY): Payer: Self-pay

## 2018-12-30 DIAGNOSIS — M4316 Spondylolisthesis, lumbar region: Secondary | ICD-10-CM | POA: Diagnosis not present

## 2018-12-30 DIAGNOSIS — M9901 Segmental and somatic dysfunction of cervical region: Secondary | ICD-10-CM | POA: Diagnosis not present

## 2018-12-30 DIAGNOSIS — M4003 Postural kyphosis, cervicothoracic region: Secondary | ICD-10-CM | POA: Diagnosis not present

## 2018-12-30 DIAGNOSIS — M542 Cervicalgia: Secondary | ICD-10-CM | POA: Diagnosis not present

## 2018-12-30 DIAGNOSIS — M9902 Segmental and somatic dysfunction of thoracic region: Secondary | ICD-10-CM | POA: Diagnosis not present

## 2018-12-30 DIAGNOSIS — S29019A Strain of muscle and tendon of unspecified wall of thorax, initial encounter: Secondary | ICD-10-CM | POA: Diagnosis not present

## 2018-12-30 DIAGNOSIS — M9903 Segmental and somatic dysfunction of lumbar region: Secondary | ICD-10-CM | POA: Diagnosis not present

## 2018-12-30 DIAGNOSIS — R293 Abnormal posture: Secondary | ICD-10-CM | POA: Diagnosis not present

## 2018-12-30 DIAGNOSIS — M50322 Other cervical disc degeneration at C5-C6 level: Secondary | ICD-10-CM | POA: Diagnosis not present

## 2019-01-01 ENCOUNTER — Ambulatory Visit (INDEPENDENT_AMBULATORY_CARE_PROVIDER_SITE_OTHER): Payer: Medicare Other | Admitting: Gastroenterology

## 2019-01-01 ENCOUNTER — Other Ambulatory Visit (INDEPENDENT_AMBULATORY_CARE_PROVIDER_SITE_OTHER): Payer: Medicare Other

## 2019-01-01 ENCOUNTER — Encounter: Payer: Self-pay | Admitting: Gastroenterology

## 2019-01-01 VITALS — BP 130/74 | HR 62 | Ht 69.0 in | Wt 150.2 lb

## 2019-01-01 DIAGNOSIS — K625 Hemorrhage of anus and rectum: Secondary | ICD-10-CM | POA: Diagnosis not present

## 2019-01-01 DIAGNOSIS — K7460 Unspecified cirrhosis of liver: Secondary | ICD-10-CM

## 2019-01-01 DIAGNOSIS — K219 Gastro-esophageal reflux disease without esophagitis: Secondary | ICD-10-CM

## 2019-01-01 LAB — CBC WITH DIFFERENTIAL/PLATELET
Basophils Absolute: 0 10*3/uL (ref 0.0–0.1)
Basophils Relative: 0.4 % (ref 0.0–3.0)
Eosinophils Absolute: 0.1 10*3/uL (ref 0.0–0.7)
Eosinophils Relative: 1.9 % (ref 0.0–5.0)
HCT: 40.1 % (ref 39.0–52.0)
Hemoglobin: 13.6 g/dL (ref 13.0–17.0)
Lymphocytes Relative: 14.6 % (ref 12.0–46.0)
Lymphs Abs: 1 10*3/uL (ref 0.7–4.0)
MCHC: 34 g/dL (ref 30.0–36.0)
MCV: 89.3 fl (ref 78.0–100.0)
Monocytes Absolute: 0.6 10*3/uL (ref 0.1–1.0)
Monocytes Relative: 9.1 % (ref 3.0–12.0)
Neutro Abs: 5.1 10*3/uL (ref 1.4–7.7)
Neutrophils Relative %: 74 % (ref 43.0–77.0)
Platelets: 302 10*3/uL (ref 150.0–400.0)
RBC: 4.49 Mil/uL (ref 4.22–5.81)
RDW: 14.2 % (ref 11.5–15.5)
WBC: 6.9 10*3/uL (ref 4.0–10.5)

## 2019-01-01 LAB — COMPREHENSIVE METABOLIC PANEL
ALT: 13 U/L (ref 0–53)
AST: 27 U/L (ref 0–37)
Albumin: 4.3 g/dL (ref 3.5–5.2)
Alkaline Phosphatase: 65 U/L (ref 39–117)
BUN: 29 mg/dL — ABNORMAL HIGH (ref 6–23)
CO2: 27 mEq/L (ref 19–32)
Calcium: 9.9 mg/dL (ref 8.4–10.5)
Chloride: 101 mEq/L (ref 96–112)
Creatinine, Ser: 1.15 mg/dL (ref 0.40–1.50)
GFR: 66.48 mL/min (ref >60.00)
Glucose, Bld: 84 mg/dL (ref 70–99)
Potassium: 4.2 mEq/L (ref 3.5–5.1)
Sodium: 136 mEq/L (ref 135–145)
Total Bilirubin: 1.3 mg/dL — ABNORMAL HIGH (ref 0.2–1.2)
Total Protein: 7.8 g/dL (ref 6.0–8.3)

## 2019-01-01 LAB — AMMONIA: Ammonia: 34 umol/L (ref 11–35)

## 2019-01-01 LAB — PROTIME-INR
INR: 3 ratio — ABNORMAL HIGH (ref 0.8–1.0)
Prothrombin Time: 34.1 s — ABNORMAL HIGH (ref 9.6–13.1)

## 2019-01-01 MED ORDER — FLUTICASONE PROPIONATE 0.05 % EX CREA: TOPICAL_CREAM | Freq: Two times a day (BID) | CUTANEOUS | 1 refills | Status: AC

## 2019-01-01 NOTE — Patient Instructions (Addendum)
If you are age 72 or older, your body mass index should be between 23-30. Your Body mass index is 22.19 kg/m. If this is out of the aforementioned range listed, please consider follow up with your Primary Care Provider.  If you are age 66 or younger, your body mass index should be between 19-25. Your Body mass index is 22.19 kg/m. If this is out of the aformentioned range listed, please consider follow up with your Primary Care Provider.   We have sent the following medications to your pharmacy for you to pick up at your convenience: Fluticasone  Please purchase the following medications over the counter and take as directed: Benfiber  Please go to the lab on the 2nd floor [address] before you leave the office today.   Thank you,  Dr. Jackquline Denmark

## 2019-01-01 NOTE — Progress Notes (Signed)
Chief Complaint: GERD  Referring Provider:  Townsend Roger, MD      ASSESSMENT AND PLAN;   #1. GERD (s/p EGD 06/2017 - at [facility], [place] -report reviewed, mild gastritis with negative biopsies, no varices) Plan: -protonix 34m po qd to continue.  #2.  Liver Cirrhosis (likely cardiac liver cirrhosis). No ETOH. No evidence of portal hypertension.  Mild ascites likely due to right heart failure associated with cor pulmonale with pulmonary hypertension. [place] Plan -Fluid and salt restricted diet is already being done. -Continue torsemide and spironolactone as per cardiology. -No need for liver biopsy or any further liver work-up at the present time.  Besides, patient is on Coumadin due to AVR. -Do recommend annual liver ultrasounds. -Patient will call [place]ore[place] 2 weeks to let [place] how he is doing.  He is to follow-up in 6 months.    #3. Rectal bleeding with constipation. Neg hemoccult today. [place] Plan: - Check CBC, CMP, PT, ammonia and AFP. - Benefiber 1 tbs po qd - fluticasone cream 0.05% generic 30g 1 bid PR x 10 days, 1 refill - Wants to hold off on colon. He does understand risks and benefits including small but definite risks of missing colorectal neoplasms.  If he has any recurrent rectal bleeding, he would reconsider. He will need to be off Coumadin and will likely need Lovenox bridging. - FU in 12 weeks.    HPI:    MSANDIP POWERis a 73y.o. male  For follow-up visit 2 episodes of rectal bleeding associated with constipation and straining.  None since. He does get constipated every time he takes torsemide. Denies having any abdominal or rectal pain. [place]  He had to be on Lovenox bridging prior to colonoscopy which he did not like.  He would like to avoid colonoscopy at the present time.  Discharge diagnoses from recent discharge summary: 1. Pulmonary hypertension with cor pulmonale with right-sided  failure. - On macitentan and adcirca 2. Chronic respiratory failure 3. Chronic atrial flutter 4. CAD s/p CABG 5. S/p mechanical AVR 6. Leukocytoclastic vasculitis 7. Cirrhosis 8. Rotator cuff tear 9. Fever with enterococcal bacteremia, neg WU for endocarditis including transesophageal echo.    Past GI procedures : EGD 06/2011 as above.   Colonoscopy 2015. [place]  Past Medical History:  Diagnosis Date  . 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 In light of concurrent left nephrolithiasis, will check CT renal colic next year instead of renal [place]   .Marland KitchenAnticoagulated on Coumadin 01/04/2018  . Anxiety 05/10/2016   Last Assessment & Plan:  Doing well off of zoloft.  . Asymptomatic microscopic hematuria 06/20/2017   Last Assessment & Plan:  [facility]. UA with 2+ blood last visit - we discussed recommendation for repeat workup at 5 years or if degree of hematuria progresses.   . Atrial fibrillation ([place] 03/29/2017  . Atrial flutter ([place] 03/29/2017  . Chronic allergic rhinitis 04/28/2016   Last Assessment & Plan:  Continue astelin  . Chronic anticoagulation 03/29/2017  . Chronic atrial fibrillation 07/23/2015   Last Assessment & Plan:  Coumadin and metoprolol, cardiology referral to establish care.  . Chronic midline back pain 12/14/2015   Last Assessment & Plan:  Pain management referral for further evaluation.  .[name]  Chronic prostatitis 07/23/2015   Last Assessment & Plan:  Has largely resolved since stopping bike riding. Recommend annual DRE AND PSA - will see back 12/2015 for annual screening, given 1st degree fhx. To call office for recurrent prostatitis symptoms.   [name] COPD (chronic obstructive pulmonary disease) ([place]) 06/12/2016  . Coronary artery disease involving native coronary artery of native heart with angina pectoris ([place]) 03/29/2017  . Cough 10/17/2016     Last Assessment & Plan:  Discussed typical course for acute viral illness. If symptoms worsen or fail to improve by 7-10d, delayed ATBs, fluids, rest, NSAIDs/APAP prn. Seek care if not improving. Needs earlier INR check due to ATBs.  [name] Dyspnea 02/01/2016   Last Assessment & Plan:  Overall improving, eval by pulm, plan for CT, neg stress test with cardiology. Recent switch to carvedilol due to side effects.  . Epidermoid cyst of skin 08/24/2017  . Essential hypertension 12/14/2015   Last Assessment & Plan:  Hypertension control: controlled  Medications: compliant Medication Management: as noted in orders Home blood pressure monitoring recommended additionally as needed for symptoms  The patient's care plan was reviewed and updated. Instructions and counseling were provided regarding patient goals and barriers. He was counseled to adopt a healthy lifestyle. Educational resources and self-management tools have been provided as charted in [facility] list.   . H/O maze procedure 03/29/2017  . [name] 03/29/2017   Overview:  2011  . Hx of CABG 03/29/2017  . Hyperlipidemia 03/29/2017  . Hypertensive heart disease 03/29/2017  . Kidney stones 07/23/2015   Overview:  x 3  Last Assessment & Plan:  By [place] has left nephrolithiasis, but not visible by KUB. Will check CT renal colic next year to assess both stone burden as well as to surveil AML.   . Leukocytoclastic vasculitis ([place]) 10/01/2017  . Localized edema 01/04/2018  . Lumbar radicular pain 01/19/2016  . Maculopapular rash 09/03/2017  . Nephrolithiasis 07/23/2015   Overview:  x 3  Last Assessment & Plan:  By [place] has left nephrolithiasis, but not visible by KUB. Will check CT renal colic next year to assess both stone burden as well as to surveil AML.   Overview:  x 3  Last Assessment & Plan:  Has 45m nonobstructing LUP stone - not visible by KUB.  Will check renal [place]2019 - he will contact office sooner if symptomatic.   . Non-sustained  ventricular tachycardia ([place] 03/29/2017  . Other hyperlipidemia 03/29/2017  . Palpitations 10/01/2017  . Paroxysmal atrial fibrillation ([place] 03/29/2017  . Paroxysmal atrial fibrillation ([place] 03/29/2017  . Pleural effusion, bilateral 08/09/2017  . Post-nasal drainage 02/25/2016   Last Assessment & Plan:  Trial zyrtec and flonase  . Prostate cancer screening 06/20/2017   Last Assessment & Plan:  Recommend continued annual CaP screening until within 10 years of life expectancy. Given good health and fhx of longevity, would anticipate CaP screening to continue until age 73  PSA today and again in one year on day of visit.  . Pulmonary hypertension ([facility] 08/09/2017  . Pulmonary nodules 06/12/2016  . S/P AVR (aortic valve replacement) 03/20/2016  . Supratherapeutic INR 07/26/2017  . Syncope 03/29/2017  . Syncope and collapse 02/01/2016  . Typical atrial flutter ([place] 02/01/2016    Past Surgical History:  Procedure Laterality Date  . CHOLECYSTECTOMY    . CORONARY ARTERY BYPASS GRAFT    . EXPLORATORY LAPAROTOMY  07/30/2017  . FOOT SURGERY    . FRACTURE SURGERY Right  wrist and forearm  . HERNIA REPAIR    . MECHANICAL AORTIC VALVE REPLACEMENT    . NASAL SINUS SURGERY    . RIGHT HEART CATH N/A 01/18/2018   Procedure: RIGHT HEART CATH;  Surgeon: Jolaine Artist, MD;  Location: [facility];  Service: Cardiovascular;  Laterality: N/A;  . RIGHT HEART CATH N/A 05/14/2018   Procedure: RIGHT HEART CATH;  Surgeon: Jolaine Artist, MD;  Location: [facility];  Service: Cardiovascular;  Laterality: N/A;  . TEE WITHOUT CARDIOVERSION N/A 05/21/2018   Procedure: TRANSESOPHAGEAL ECHOCARDIOGRAM (TEE);  Surgeon: Jolaine Artist, MD;  Location: [facility] ENDOSCOPY;  Service: Cardiovascular;  Laterality: N/A;  . UPPER GASTROINTESTINAL ENDOSCOPY  07/12/2017   Patchy areas of mucosal inflammation noted in the antrum with edema,erthema and ulcerations. Bx. Chronicfocally active gastritis.  [name] VASECTOMY       Family History  Problem Relation Age of Onset  . Asthma Mother   . Arthritis Mother   . Heart attack Father   . Hypertension Father   . Stroke Paternal Grandmother   . Colon cancer Neg Hx     Social History   Tobacco Use  . Smoking status: Former Smoker    Packs/day: 2.00    Years: 34.00    Pack years: 68.00    Types: Cigarettes    Last attempt to quit: 07/16/2000    Years since quitting: 18.4  . Smokeless tobacco: Never Used  Substance Use Topics  . Alcohol use: Yes  . Drug use: No    Current Outpatient Medications  Medication Sig Dispense Refill  . acetaminophen (TYLENOL) 650 MG CR tablet Take 650 mg by mouth every 8 (eight) hours as needed for pain.    [name] albuterol (PROVENTIL HFA;VENTOLIN HFA) 108 (90 Base) MCG/ACT inhaler Inhale 2 puffs into the lungs as needed for wheezing or shortness of breath.    [name] aspirin EC 81 MG tablet Take 81 mg by mouth daily.    . B Complex-C (B-COMPLEX WITH VITAMIN C) tablet Take 1 tablet by mouth daily.     . cetirizine (ZYRTEC ALLERGY) 10 MG tablet Take 10 mg by mouth daily as needed for allergies.     . Coenzyme Q10 (COQ10 PO) Take 1 tablet by mouth daily.    [name] ENSURE (ENSURE) Take 237 mLs by mouth every other day.    . macitentan (OPSUMIT) 10 MG tablet Take 1 tablet (10 mg total) by mouth daily. 30 tablet 0  . midodrine (PROAMATINE) 10 MG tablet Take 1 tablet (10 mg total) by mouth 3 (three) times daily with meals. 90 tablet 6  . multivitamin-lutein (OCUVITE-LUTEIN) CAPS capsule Take 1 capsule by mouth daily.    . pantoprazole (PROTONIX) 40 MG tablet Take 1 tablet (40 mg total) by mouth daily. 30 tablet 11  . potassium chloride (K-DUR) 10 MEQ tablet Take 2 tablets (20 mEq total) by mouth daily. 184 tablet 2  . rosuvastatin (CRESTOR) 5 MG tablet Take 1 tablet (5 mg total) by mouth daily. 30 tablet 6  . spironolactone (ALDACTONE) 25 MG tablet Take 0.5 tablets (12.5 mg total) by mouth daily. 45 tablet 3  . tadalafil, PAH, (ADCIRCA) 20 MG  tablet TAKE 2 TABLETS BY MOUTH ONCE A DAY 60 tablet 12  . torsemide (DEMADEX) 20 MG tablet Take 20 mg by mouth 2 (two) times daily as needed (will take 3 times a day if needed).     . warfarin (COUMADIN) 5 MG tablet As directed by coumadin  clinic (Patient taking differently: 12m on Tuesday, Thursday and Saturday. 2.567mon Monday, Wednesday, Friday, and Sunday) 60 tablet 0   No current facility-administered medications for this visit.     Allergies  Allergen Reactions  . Amoxicillin-Pot Clavulanate Diarrhea    Stomach pain Has patient had a PCN reaction causing immediate rash, facial/tongue/throat swelling, SOB or lightheadedness with hypotension: No Has patient had a PCN reaction causing severe rash involving mucus membranes or skin necrosis: No Has patient had a PCN reaction that required hospitalization: No Has patient had a PCN reaction occurring within the last 10 years: Yes If all of the above answers are "NO", then may proceed with Cephalosporin use.   . Tape Rash and Other (See Comments)    Surgical tape    Review of Systems:  Has easy bruisability Multiple skin bruises.    Physical Exam:    BP 130/74   Pulse 62   Ht 5' 9" (1.753 m)   Wt 150 lb 4 oz (68.2 kg)   BMI 22.19 kg/m  Filed Weights   01/01/19 1048  Weight: 150 lb 4 oz (68.2 kg)   Constitutional:  Well-developed, in no acute distress. Psychiatric: Normal mood and affect. Behavior is normal. HEENT: Pupils normal.  Conjunctivae are normal. No scleral icterus. Neck supple.  Cardiovascular: Normal rate,. No edema Pulmonary/chest: Bilateral decreased breath sounds. Abdominal: Soft, nondistended. Nontender. Bowel sounds active throughout. There are no masses palpable. No hepatomegaly. Rectal: Small internal hemorrhoids, brown heme-negative stools. Seen in presence of TrDocia ChuckMA Neurological: Alert and oriented to person place and time. Skin: Skin is warm and dry. No rashes noted.  Data Reviewed: I  have personally reviewed following labs and imaging studies  CBC: CBC Latest Ref Rng & Units 07/23/2018 06/03/2018 05/23/2018  WBC 3.4 - 10.8 x10E3/uL 6.9 6.5 5.9  Hemoglobin 13.0 - 17.7 g/dL 12.5(L) 11.8(L) 12.9(L)  Hematocrit 37.5 - 51.0 % 36.9(L) 36.7(L) 40.0  Platelets 150 - 450 x10E3/uL 283 246 230    CMP: CMP Latest Ref Rng & Units 11/13/2018 10/21/2018 10/02/2018  Glucose 65 - 99 mg/dL 97 101(H) 71  BUN 8 - 27 mg/dL 30(H) 26(H) 20  Creatinine 0.76 - 1.27 mg/dL 1.25 1.23 1.09  Sodium 134 - 144 mmol/L 135 139 141  Potassium 3.5 - 5.2 mmol/L 3.9 3.0(L) 3.0(L)  Chloride 96 - 106 mmol/L 98 103 104  CO2 20 - 29 mmol/L _0 Calcium 8.6 - 10.2 mg/dL 9.9 9.6 9.7  Total Protein 6.5 - 8.1 g/dL - - -  Total Bilirubin 0.3 - 1.2 mg/dL - - -  Alkaline Phos 38 - 126 U/L - - -  AST 15 - 41 U/L - - -  ALT 17 - 63 U/L - - -   Hepatic Function Latest Ref Rng & Units 05/18/2018 05/14/2018  Total Protein 6.5 - 8.1 g/dL 7.3 7.7  Albumin 3.5 - 5.0 g/dL 2.9(L) 3.0(L)  AST 15 - 41 U/L 39 37  ALT 17 - 63 U/L 13(L) 14(L)  Alk Phosphatase 38 - 126 U/L 69 78  Total Bilirubin 0.3 - 1.2 mg/dL 2.5(H) 4.8(H)  Bilirubin, Direct 0.1 - 0.5 mg/dL 1.1(H) -       Radiology Studies:  Ct Chest High Resolution  Result Date: 05/16/2018 IMPRESSION: 1. Slightly irregular 0.9 cm peripheral right middle lobe solid pulmonary nodule. 2. minimal associated traction bronchiolectasis. 3. Mild cardiomegaly. Minimal interlobular septal thickening and trace dependent bilateral pleural effusions. These findings suggest a mild component of  congestive heart failure. 4. Small to moderate volume perihepatic ascites. 5. Ectatic 4.3 cm ascending thoracic aorta. 6. Left main and 3 vessel coronary atherosclerosis. Aortic Atherosclerosis (ICD10-I70.0) and Emphysema (ICD10-J43.9). Electronically Signed   By: Ilona Sorrel M.D.   On: 05/16/2018 16:11    US Liver Doppler:  Result Date: 05/16/2018 CLINICAL DATA:  Hepatic cirrhosis.  EXAM: DUPLEX ULTRASOUND OF LIVER TECHNIQUE: Color and duplex Doppler ultrasound was performed to evaluate the hepatic in-flow and out-flow vessels. COMPARISON:  None. FINDINGS: Portal Vein Velocities Main:  26 cm/sec Right:  14 cm/sec Left:  18 cm/sec Normal hepatopetal flow is noted in the portal veins. Hepatic Vein Velocities Right:  40 cm/sec Middle:  30 cm/sec Left:  32 cm/sec Normal hepatofugal flow is noted in the hepatic veins. Hepatic Artery Velocity:  65 cm/sec Splenic Vein Velocity:  33.4 cm/sec Varices: Absent. Ascites: Present. Superior mesenteric vein velocity: 30 centimeters/second IMPRESSION: No Doppler evidence of hepatic, splenic or portal venous thrombosis or occlusion. Mild ascites is noted. Electronically Signed   By: Marijo Conception, M.D.   On: 05/16/2018 13:47   US Abdomen Limited Ruq:  Result Date: 05/16/2018 CLINICAL DATA:  Hepatic cirrhosis. EXAM: ULTRASOUND ABDOMEN LIMITED RIGHT UPPER QUADRANT COMPARISON:  None. FINDINGS: Gallbladder: Status post cholecystectomy. Common bile duct: Diameter: 3.5 mm which is within normal limits. Liver: No focal lesion identified. Mildly heterogeneous echotexture of hepatic parenchyma is noted with nodular hepatic contours suggesting cirrhosis. Mild ascites is noted. Portal vein is patent on color Doppler imaging with normal direction of blood flow towards the liver. IMPRESSION: Status post cholecystectomy. Findings consistent with hepatic cirrhosis. Mild ascites is noted around the liver. No focal abnormality is noted. Electronically Signed   By: Marijo Conception, M.D.   On: 05/16/2018 13:32  25 minutes spent with the patient today. Greater than 50% was spent in counseling and coordination of care with the patient     Carmell Austria, MD 01/01/2019, 11:07 AM  Cc: Townsend Roger, MD

## 2019-01-02 ENCOUNTER — Telehealth: Payer: Self-pay | Admitting: Cardiology

## 2019-01-02 DIAGNOSIS — M9903 Segmental and somatic dysfunction of lumbar region: Secondary | ICD-10-CM | POA: Diagnosis not present

## 2019-01-02 DIAGNOSIS — M9901 Segmental and somatic dysfunction of cervical region: Secondary | ICD-10-CM | POA: Diagnosis not present

## 2019-01-02 DIAGNOSIS — M50322 Other cervical disc degeneration at C5-C6 level: Secondary | ICD-10-CM | POA: Diagnosis not present

## 2019-01-02 DIAGNOSIS — S29019A Strain of muscle and tendon of unspecified wall of thorax, initial encounter: Secondary | ICD-10-CM | POA: Diagnosis not present

## 2019-01-02 DIAGNOSIS — M4316 Spondylolisthesis, lumbar region: Secondary | ICD-10-CM | POA: Diagnosis not present

## 2019-01-02 DIAGNOSIS — M542 Cervicalgia: Secondary | ICD-10-CM | POA: Diagnosis not present

## 2019-01-02 DIAGNOSIS — R293 Abnormal posture: Secondary | ICD-10-CM | POA: Diagnosis not present

## 2019-01-02 DIAGNOSIS — M4003 Postural kyphosis, cervicothoracic region: Secondary | ICD-10-CM | POA: Diagnosis not present

## 2019-01-02 DIAGNOSIS — M9902 Segmental and somatic dysfunction of thoracic region: Secondary | ICD-10-CM | POA: Diagnosis not present

## 2019-01-02 LAB — AFP TUMOR MARKER: AFP-Tumor Marker: 3.2 ng/mL (ref <6.1)

## 2019-01-02 NOTE — Telephone Encounter (Signed)
Patient called and he had PT drawn at Dr Lyndel Safe office and he doesn't think he needs to have next Tuesday.. it was 3 but please see labs in chart.Lance Hicks

## 2019-01-06 ENCOUNTER — Ambulatory Visit (INDEPENDENT_AMBULATORY_CARE_PROVIDER_SITE_OTHER): Payer: Medicare Other | Admitting: Pharmacist

## 2019-01-06 DIAGNOSIS — M9903 Segmental and somatic dysfunction of lumbar region: Secondary | ICD-10-CM | POA: Diagnosis not present

## 2019-01-06 DIAGNOSIS — M9902 Segmental and somatic dysfunction of thoracic region: Secondary | ICD-10-CM | POA: Diagnosis not present

## 2019-01-06 DIAGNOSIS — M4316 Spondylolisthesis, lumbar region: Secondary | ICD-10-CM | POA: Diagnosis not present

## 2019-01-06 DIAGNOSIS — M50322 Other cervical disc degeneration at C5-C6 level: Secondary | ICD-10-CM | POA: Diagnosis not present

## 2019-01-06 DIAGNOSIS — Z7901 Long term (current) use of anticoagulants: Secondary | ICD-10-CM

## 2019-01-06 DIAGNOSIS — M4003 Postural kyphosis, cervicothoracic region: Secondary | ICD-10-CM | POA: Diagnosis not present

## 2019-01-06 DIAGNOSIS — M9901 Segmental and somatic dysfunction of cervical region: Secondary | ICD-10-CM | POA: Diagnosis not present

## 2019-01-06 DIAGNOSIS — M542 Cervicalgia: Secondary | ICD-10-CM | POA: Diagnosis not present

## 2019-01-06 DIAGNOSIS — Z5181 Encounter for therapeutic drug level monitoring: Secondary | ICD-10-CM

## 2019-01-06 DIAGNOSIS — S29019A Strain of muscle and tendon of unspecified wall of thorax, initial encounter: Secondary | ICD-10-CM | POA: Diagnosis not present

## 2019-01-06 DIAGNOSIS — R293 Abnormal posture: Secondary | ICD-10-CM | POA: Diagnosis not present

## 2019-01-06 HISTORY — DX: Encounter for therapeutic drug level monitoring: Z51.81

## 2019-01-06 NOTE — Telephone Encounter (Signed)
See anticoag encounter from 01/06/19 for details.

## 2019-01-08 ENCOUNTER — Other Ambulatory Visit: Payer: Self-pay | Admitting: *Deleted

## 2019-01-08 DIAGNOSIS — M4316 Spondylolisthesis, lumbar region: Secondary | ICD-10-CM | POA: Diagnosis not present

## 2019-01-08 DIAGNOSIS — M9903 Segmental and somatic dysfunction of lumbar region: Secondary | ICD-10-CM | POA: Diagnosis not present

## 2019-01-08 DIAGNOSIS — M50322 Other cervical disc degeneration at C5-C6 level: Secondary | ICD-10-CM | POA: Diagnosis not present

## 2019-01-08 DIAGNOSIS — M4003 Postural kyphosis, cervicothoracic region: Secondary | ICD-10-CM | POA: Diagnosis not present

## 2019-01-08 DIAGNOSIS — M9902 Segmental and somatic dysfunction of thoracic region: Secondary | ICD-10-CM | POA: Diagnosis not present

## 2019-01-08 DIAGNOSIS — R293 Abnormal posture: Secondary | ICD-10-CM | POA: Diagnosis not present

## 2019-01-08 DIAGNOSIS — M9901 Segmental and somatic dysfunction of cervical region: Secondary | ICD-10-CM | POA: Diagnosis not present

## 2019-01-08 DIAGNOSIS — M542 Cervicalgia: Secondary | ICD-10-CM | POA: Diagnosis not present

## 2019-01-08 DIAGNOSIS — S29019A Strain of muscle and tendon of unspecified wall of thorax, initial encounter: Secondary | ICD-10-CM | POA: Diagnosis not present

## 2019-01-08 MED ORDER — WARFARIN SODIUM 5 MG PO TABS: ORAL_TABLET | ORAL | 0 refills | Status: DC

## 2019-01-13 ENCOUNTER — Other Ambulatory Visit (HOSPITAL_COMMUNITY): Payer: Self-pay

## 2019-01-13 ENCOUNTER — Encounter (HOSPITAL_COMMUNITY): Payer: Self-pay | Admitting: Internal Medicine

## 2019-01-13 ENCOUNTER — Ambulatory Visit (HOSPITAL_COMMUNITY)
Admission: RE | Admit: 2019-01-13 | Discharge: 2019-01-13 | Disposition: A | Discharge status: Home / Self Care | Payer: Medicare Other | Source: Ambulatory Visit | Attending: Internal Medicine | Admitting: Internal Medicine

## 2019-01-13 VITALS — BP 132/62 | HR 74 | Wt 152.2 lb

## 2019-01-13 DIAGNOSIS — Z87891 Personal history of nicotine dependence: Secondary | ICD-10-CM | POA: Diagnosis not present

## 2019-01-13 DIAGNOSIS — Z7982 Long term (current) use of aspirin: Secondary | ICD-10-CM | POA: Insufficient documentation

## 2019-01-13 DIAGNOSIS — Z9049 Acquired absence of other specified parts of digestive tract: Secondary | ICD-10-CM | POA: Insufficient documentation

## 2019-01-13 DIAGNOSIS — J9610 Chronic respiratory failure, unspecified whether with hypoxia or hypercapnia: Secondary | ICD-10-CM | POA: Insufficient documentation

## 2019-01-13 DIAGNOSIS — Z8249 Family history of ischemic heart disease and other diseases of the circulatory system: Secondary | ICD-10-CM | POA: Diagnosis not present

## 2019-01-13 DIAGNOSIS — Z880 Allergy status to penicillin: Secondary | ICD-10-CM | POA: Insufficient documentation

## 2019-01-13 DIAGNOSIS — Z952 Presence of prosthetic heart valve: Secondary | ICD-10-CM | POA: Insufficient documentation

## 2019-01-13 DIAGNOSIS — Z79899 Other long term (current) drug therapy: Secondary | ICD-10-CM | POA: Diagnosis not present

## 2019-01-13 DIAGNOSIS — I272 Pulmonary hypertension, unspecified: Secondary | ICD-10-CM | POA: Diagnosis not present

## 2019-01-13 DIAGNOSIS — I350 Nonrheumatic aortic (valve) stenosis: Secondary | ICD-10-CM | POA: Diagnosis not present

## 2019-01-13 DIAGNOSIS — I119 Hypertensive heart disease without heart failure: Secondary | ICD-10-CM | POA: Insufficient documentation

## 2019-01-13 DIAGNOSIS — I480 Paroxysmal atrial fibrillation: Secondary | ICD-10-CM | POA: Diagnosis not present

## 2019-01-13 DIAGNOSIS — K7460 Unspecified cirrhosis of liver: Secondary | ICD-10-CM | POA: Insufficient documentation

## 2019-01-13 DIAGNOSIS — R188 Other ascites: Secondary | ICD-10-CM | POA: Insufficient documentation

## 2019-01-13 DIAGNOSIS — W19XXXA Unspecified fall, initial encounter: Secondary | ICD-10-CM | POA: Diagnosis not present

## 2019-01-13 DIAGNOSIS — I2510 Atherosclerotic heart disease of native coronary artery without angina pectoris: Secondary | ICD-10-CM

## 2019-01-13 DIAGNOSIS — Z951 Presence of aortocoronary bypass graft: Secondary | ICD-10-CM | POA: Insufficient documentation

## 2019-01-13 DIAGNOSIS — I4892 Unspecified atrial flutter: Secondary | ICD-10-CM | POA: Diagnosis not present

## 2019-01-13 DIAGNOSIS — Z7901 Long term (current) use of anticoagulants: Secondary | ICD-10-CM | POA: Insufficient documentation

## 2019-01-13 DIAGNOSIS — I776 Arteritis, unspecified: Secondary | ICD-10-CM | POA: Insufficient documentation

## 2019-01-13 DIAGNOSIS — Z888 Allergy status to other drugs, medicaments and biological substances status: Secondary | ICD-10-CM | POA: Insufficient documentation

## 2019-01-13 DIAGNOSIS — E7849 Other hyperlipidemia: Secondary | ICD-10-CM | POA: Insufficient documentation

## 2019-01-13 DIAGNOSIS — J449 Chronic obstructive pulmonary disease, unspecified: Secondary | ICD-10-CM | POA: Insufficient documentation

## 2019-01-13 NOTE — Patient Instructions (Signed)
RESTART taking Uptravi 238mg (1 tab) twice a day.  Your physician recommends that you schedule a follow-up appointment in 3 MONTHS

## 2019-01-13 NOTE — Addendum Note (Signed)
Encounter addended by: Jovita Kussmaul, RN on: 01/13/2019 11:05 AM  Actions taken: Clinical Note Signed

## 2019-01-13 NOTE — Progress Notes (Signed)
ADVANCED HF CLINIC NOTE   Date:  01/13/2019   ID:  Lance, Hicks 1946-10-19, MRN [id number]  PCP:  Townsend Roger, MD  Cardiologist:  Dr. 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 Also has COPD (quit smoking in 2002 - 1 ppd x 25 years), HL, and HTN.  In 8/18 admitted for acute pancreatitis. Underwent lap cholecystectomy on 07/29/2017. On 8/6 hedeveloped hypotension and was taken back for open laparotomy due to bleeding from an unclear omental source.He was given fluids and 8 units PRBC perioperatively. He says his breathing has been bad since that time.   Seen at [place] in 2018 had hi-res CT, PFTs and VQ (low prob). Felt to have Golds I COPD with asthma overlap and early pulmonary fibrosis with PAH. Thought felt pulmonary pressures may have been elevated due to recent illness. Referred to Rheumatology though appt not until May. Suggested possible RHC.   He was also seen by Cardiology. Echocardiogram 8/18 as below showed normal LV function with septal flattening and RVSP 65-55mHG. There was no comment on RV size or function. 14-day event monitor showed 10-beat run NSVT. Felt to have chronic AF and PAH. No further testing ordered.  Underwent RHC on 01/18/18 with severe PAH and cor pulmonale. Macitentan and Adcirca.   Admitted 5/21-5/30/19 with symptomatic hypotension and weight gain. He had a repeat RHC and echo with bubble study (results below). He was started on midodrine for BP support. Diuresed 25 lbs with IV lasix, then transitioned to torsemide 60 mg daily. Abdominal UKoreashowed cirrhosis with no evidence of portal HTN. Pulm consulted for possible ILD on High-res chest CT. Ortho consulted for right rotator cuff tear from horse  accident PTA. He was treated with antibiotics for Bacteremia. DC weight: 140 lbs.   He presents today for regular follow up. Remains on Adcirca and macitentan. We recently started [place] He tolerated 200 bid well but then got severe myalagis on 400 bid so he stopped. Says breathing is "not bad" had been doing pretty well until he tripped on a rug and fell last week. No syncope. Did hit face. No edema. Denies HAs or other neuro symptoms. Volume status well controlled on torsemide 40 daily.  Takes an extra 271mabout once a week.   Review of systems complete and found to be negative unless listed in HPI.   Studies:  RHC 5/21/19showed Mild/Moderate PAH in setting of high output with no evidence of intracardiac shunting. Hemodynamics as below.   High-Res Chest CT 05/16/18 1. Slightly irregular 0.9 cm peripheral right middle lobe solid pulmonary nodule. 3 month follow up recommended.  2. Dependent basilar predominant patchy subpleural reticulation and ground-glass attenuation with the suggestion of minimal associated traction bronchiolectasis. No frank honeycombing. Findings may indicate an interstitial lung disease such as early usual interstitial pneumonia (UIP) or fibrotic phase nonspecific interstitial pneumonia (NSIP). Suggest a follow-up high-resolution chest CT study in 6-12 months to assess temporal pattern stability, as clinically warranted. 3. Mild cardiomegaly. Minimal interlobular septal thickening and trace dependent bilateral pleural effusions.  4. Small to moderate volume perihepatic ascites. 5. Ectatic 4.3 cm ascending thoracic aorta. Recommend annual imaging followup by CTA or MRA.  6. Left  main and 3 vessel coronary atherosclerosis.  US Abdomen RUQ 05/16/18 - s/p cholecystectomy. + hepatic cirrhosis. Mild ascites.   Liver Doppler 05/16/18 - No hepatic, splenic, or portal venous thrombosis or occlusion. Mild ascites.  Echo with bubble study 05/16/18 LVEF 55-60% with  "late bubbles" , Mechanical AVR stable with trivial perivalvular regurg, Mild MR, Severe LAE, Severe RV dilation and reduced function. No PFO. Mod TR, PA peak pressure 68 mm Hg  [place] numbers5/24/19: PA 60-19 (32) CVP ~10 CO 5.4 CI 2.9  RHC 05/14/18 RA = 18 RV = 71/17 PA = 69/24 (39) PCW = 20 Fick cardiac output/index = 7.0/3.7 Thermo CO/CI = 7.2/3.8 PVR = 2.6 WU Ao sat = 97% PA sat = 73%, 73% SVC sat = 73%  RA sat = 72%  RHC 01/18/18 RA = 23 RV = 84/21 PA = 81/38 (53) PCW = 22 Fick cardiac output/index = 3.0/1.6 PVR = 10.4 WU FA sat = 98% on 2L  (checked on RA = 93%) PA sat = 60%, 61% SVC sat = 62%   Echo 8/18  1. LVEF is estimated to be 55%. 2. There is septal flattening consistent with right ventricular pressure and or volume overload. 3. A mechanical prosthetic aortic valve is present with mild AI 4. There is moderate to severe tricuspid regurgitation. 5. There is a trivial amount of pulmonic regurgitation. 6. The right ventricular systolic pressure is calculated at 65-58mHg. 7. The inferior vena cava is dilated with <50% inspiratory collapse, suggestive of an elevated right atrial pressure (125mg).  14 Day Patch 10/01/2017-10/15/2017:  -1 run of Ventricular Tachycardia occurred lasting 10 beats with a max rate of 240 bpm (avg 161 bpm). -Atrial Flutter occurred continuously (100% burden), ranging from 55-132 bpm (avg of 87 bpm).   NM Ventilation and Perfusion Lung Scan 10/31/2017:  Low probability of pulmonary embolism, using the PIOPED criteria.  Lower extremity venous duplex 07/25/2017:  1. Low probability of acute deep vein thrombosis in bilateral lower extremities. 2. Calf veins are poorly visualized but appear patent in limited segments visualized.   PFTs 07/02/2017:  FVC86% FEV185% FEV1/FVC 73% DLCO8.2, 32%   6MW -1400 feet; 97% to 85% (recovered with resting)   CT  Chest07/08/2017: 1. Stable right middle lobe pulmonary nodule measuring up to 1 cm. 2. Minimal subpleural reticulation without evidence of honeycombing to  suggest pulmonary fibrosis. Stable centrilobular emphysema. 3. Mediastinal lymphadenopathy, increased from prior. 4. Calcified right hilar lymph nodes and multiple calcified granulomas  throughout the right lung consistent with prior granulomatous disease. 5. Ascending thoracic aortic aneurysm measuring up to 4.5 cm, unchanged. 6. Dilatation of the main pulmonary artery suggestive of pulmonary  hypertension. 7. Cardiomegaly and dense atherosclerotic coronary artery calcifications.    Past Medical History:  Diagnosis Date  . 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 In light of concurrent left nephrolithiasis, will check CT renal colic next year instead of renal [place]  . Marland Kitchennticoagulated on Coumadin 01/04/2018  . Anxiety 05/10/2016   Last Assessment & Plan:  Doing well off of zoloft.  . Asymptomatic microscopic hematuria 06/20/2017   Last Assessment & Plan:  [facility] and cystoscopy. UA with 2+ blood last visit - we discussed recommendation for repeat workup at 5 years or if degree of hematuria progresses.   . Atrial fibrillation (HCBeechmont4/04/2017  . Atrial flutter (HCForest J[place]4/04/2017  . Chronic allergic rhinitis  04/28/2016   Last Assessment & Plan:  Continue astelin  . Chronic anticoagulation 03/29/2017  . Chronic atrial fibrillation 07/23/2015   Last Assessment & Plan:  Coumadin and metoprolol, cardiology referral to establish care.  . Chronic midline back pain 12/14/2015   Last Assessment & Plan:  Pain management referral for further evaluation.  . Chronic prostatitis 07/23/2015   Last Assessment & Plan:  Has largely resolved since stopping bike riding. Recommend annual DRE AND PSA - will see back 12/2015 for annual screening, given 1st degree fhx.  To call office for recurrent prostatitis symptoms.   [name] COPD (chronic obstructive pulmonary disease) ([place]) 06/12/2016  . Coronary artery disease involving native coronary artery of native heart with angina pectoris ([place]) 03/29/2017  . Cough 10/17/2016   Last Assessment & Plan:  Discussed typical course for acute viral illness. If symptoms worsen or fail to improve by 7-10d, delayed ATBs, fluids, rest, NSAIDs/APAP prn. Seek care if not improving. Needs earlier INR check due to ATBs.  [name] Dyspnea 02/01/2016   Last Assessment & Plan:  Overall improving, eval by pulm, plan for CT, neg stress test with cardiology. Recent switch to carvedilol due to side effects.  . Epidermoid cyst of skin 08/24/2017  . Essential hypertension 12/14/2015   Last Assessment & Plan:  Hypertension control: controlled  Medications: compliant Medication Management: as noted in orders Home blood pressure monitoring recommended additionally as needed for symptoms  The patient's care plan was reviewed and updated. Instructions and counseling were provided regarding patient goals and barriers. He was counseled to adopt a healthy lifestyle. Educational resources and self-management tools have been provided as charted in [facility] list.   . H/O maze procedure 03/29/2017  . [name] 03/29/2017   Overview:  2011  . Hx of CABG 03/29/2017  . Hyperlipidemia 03/29/2017  . Hypertensive heart disease 03/29/2017  . Kidney stones 07/23/2015   Overview:  x 3  Last Assessment & Plan:  By [place] has left nephrolithiasis, but not visible by KUB. Will check CT renal colic next year to assess both stone burden as well as to surveil AML.   . Leukocytoclastic vasculitis ([place]) 10/01/2017  . Localized edema 01/04/2018  . Lumbar radicular pain 01/19/2016  . Maculopapular rash 09/03/2017  . Nephrolithiasis 07/23/2015   Overview:  x 3  Last Assessment & Plan:  By [place] has left nephrolithiasis, but not visible by KUB. Will check CT renal colic next year to  assess both stone burden as well as to surveil AML.   Overview:  x 3  Last Assessment & Plan:  Has 2m nonobstructing LUP stone - not visible by KUB.  Will check renal [place]8/2019 - he will contact office sooner if symptomatic.   . Non-sustained ventricular tachycardia (HCushman 03/29/2017  . Other hyperlipidemia 03/29/2017  . Palpitations 10/01/2017  . Paroxysmal atrial fibrillation ([place] 03/29/2017  . Paroxysmal atrial fibrillation ([place] 03/29/2017  . Pleural effusion, bilateral 08/09/2017  . Post-nasal drainage 02/25/2016   Last Assessment & Plan:  Trial zyrtec and flonase  . Prostate cancer screening 06/20/2017   Last Assessment & Plan:  Recommend continued annual CaP screening until within 10 years of life expectancy. Given good health and fhx of longevity, would anticipate CaP screening to continue until age 89  PSA today and again in one year on day of visit.  . Pulmonary hypertension ([place] 08/09/2017  . Pulmonary nodules 06/12/2016  . S/P AVR (aortic valve replacement) 03/20/2016  . Supratherapeutic INR 07/26/2017  .  Syncope 03/29/2017  . Syncope and collapse 02/01/2016  . Typical atrial flutter ([place]) 02/01/2016    Past Surgical History:  Procedure Laterality Date  . CHOLECYSTECTOMY    . CORONARY ARTERY BYPASS GRAFT    . EXPLORATORY LAPAROTOMY  07/30/2017  . FOOT SURGERY    . FRACTURE SURGERY Right    wrist and forearm  . HERNIA REPAIR    . MECHANICAL AORTIC VALVE REPLACEMENT    . NASAL SINUS SURGERY    . RIGHT HEART CATH N/A 01/18/2018   Procedure: RIGHT HEART CATH;  Surgeon: Jolaine Artist, MD;  Location: [place] [facility];  Service: Cardiovascular;  Laterality: N/A;  . RIGHT HEART CATH N/A 05/14/2018   Procedure: RIGHT HEART CATH;  Surgeon: Jolaine Artist, MD;  Location: [place] [facility] LAB;  Service: Cardiovascular;  Laterality: N/A;  . TEE WITHOUT CARDIOVERSION N/A 05/21/2018   Procedure: TRANSESOPHAGEAL ECHOCARDIOGRAM (TEE);  Surgeon: Jolaine Artist, MD;  Location: [facility] ENDOSCOPY;   Service: Cardiovascular;  Laterality: N/A;  . UPPER GASTROINTESTINAL ENDOSCOPY  07/12/2017   Patchy areas of mucosal inflammation noted in the antrum with edema,erthema and ulcerations. Bx. Chronicfocally active gastritis.  [name] VASECTOMY      Current Medications: Current Meds  Medication Sig  . acetaminophen (TYLENOL) 650 MG CR tablet Take 650 mg by mouth every 8 (eight) hours as needed for pain.  [name] albuterol (PROVENTIL HFA;VENTOLIN HFA) 108 (90 Base) MCG/ACT inhaler Inhale 2 puffs into the lungs as needed for wheezing or shortness of breath.  [name] aspirin EC 81 MG tablet Take 81 mg by mouth daily.  . B Complex-C (B-COMPLEX WITH VITAMIN C) tablet Take 1 tablet by mouth daily.   . cetirizine (ZYRTEC ALLERGY) 10 MG tablet Take 10 mg by mouth daily as needed for allergies.   . Coenzyme Q10 (COQ10 PO) Take 1 tablet by mouth daily.  [name] ENSURE (ENSURE) Take 237 mLs by mouth every other day.  . macitentan (OPSUMIT) 10 MG tablet Take 1 tablet (10 mg total) by mouth daily.  . midodrine (PROAMATINE) 10 MG tablet Take 1 tablet (10 mg total) by mouth 3 (three) times daily with meals.  . multivitamin-lutein (OCUVITE-LUTEIN) CAPS capsule Take 1 capsule by mouth daily.  . pantoprazole (PROTONIX) 40 MG tablet Take 1 tablet (40 mg total) by mouth daily.  . potassium chloride (K-DUR) 10 MEQ tablet Take 2 tablets (20 mEq total) by mouth daily.  . rosuvastatin (CRESTOR) 5 MG tablet Take 1 tablet (5 mg total) by mouth daily.  [name] spironolactone (ALDACTONE) 25 MG tablet Take 0.5 tablets (12.5 mg total) by mouth daily.  . tadalafil, PAH, (ADCIRCA) 20 MG tablet TAKE 2 TABLETS BY MOUTH ONCE A DAY  . torsemide (DEMADEX) 20 MG tablet Take 20 mg by mouth 2 (two) times daily as needed (will take 3 times a day if needed).   . warfarin (COUMADIN) 5 MG tablet As directed by coumadin clinic     Allergies:   Amoxicillin-pot clavulanate and Tape   Social History   Socioeconomic History  . Marital status: Single    Spouse  name: Not on file  . Number of children: Not on file  . Years of education: Not on file  . Highest education level: Not on file  Occupational History  . Not on file  Social Needs  . Financial resource strain: Not on file  . Food insecurity:    Worry: Not on file    Inability: Not on file  . Transportation needs:  Medical: Not on file    Non-medical: Not on file  Tobacco Use  . Smoking status: Former Smoker    Packs/day: 2.00    Years: 34.00    Pack years: 68.00    Types: Cigarettes    Last attempt to quit: 07/16/2000    Years since quitting: 18.5  . Smokeless tobacco: Never Used  Substance and Sexual Activity  . Alcohol use: Yes  . Drug use: No  . Sexual activity: Not on file  Lifestyle  . Physical activity:    Days per week: Not on file    Minutes per session: Not on file  . Stress: Not on file  Relationships  . Social connections:    Talks on phone: Not on file    Gets together: Not on file    Attends religious service: Not on file    Active member of club or organization: Not on file    Attends meetings of clubs or organizations: Not on file    Relationship status: Not on file  Other Topics Concern  . Not on file  Social History Narrative  . Not on file     Family History: The patient's family history includes Arthritis in his mother; Asthma in his mother; Heart attack in his father; Hypertension in his father; Stroke in his paternal grandmother. There is no history of Colon cancer.   Recent Labs: 05/15/2018: TSH 2.768 05/16/2018: Magnesium 1.8 10/02/2018: B Natriuretic Peptide 206.0 01/01/2019: ALT 13; BUN 29; Creatinine, Ser 1.15; Hemoglobin 13.6; Platelets 302.0; Potassium 4.2; Sodium 136  Recent Lipid Panel    Component Value Date/Time   CHOL 125 10/21/2018 1028   TRIG 57 10/21/2018 1028   HDL 43 10/21/2018 1028   CHOLHDL 2.9 10/21/2018 1028   VLDL 11 10/21/2018 1028   LDLCALC 71 10/21/2018 1028    Physical Exam:    VS:  BP 132/62   Pulse 74    Wt 69 kg (152 lb 3.2 oz)   SpO2 98%   BMI 22.48 kg/m     Wt Readings from Last 3 Encounters:  01/13/19 69 kg (152 lb 3.2 oz)  01/01/19 68.2 kg (150 lb 4 oz)  12/03/18 68.5 kg (151 lb)    General:  Well appearing. No resp difficulty HEENT: normal R eye ecchymosis  Neck: supple. JVP 7-8. Carotids 2+ bilat; no bruits. No lymphadenopathy or thryomegaly appreciated. Cor: PMI nondisplaced. Irregular rate & rhythm. Mechanical s2.2/6 TR loud P2 Lungs: clear Abdomen: soft, nontender, nondistended. No hepatosplenomegaly. No bruits or masses. Good bowel sounds. Extremities: no cyanosis, clubbing, rash, edema hematoma on R thigh and abrasion R wrist Neuro: alert & orientedx3, cranial nerves grossly intact. moves all 4 extremities w/o difficulty. Affect pleasant    ASSESSMENT/PLAN:    1. Pulmonary hypertension with cor pulmonale/RV failure - Echo with bubble study 05/16/18 LVEF 55-60% with "late bubbles" , Mechanical AVR stable with trivial perivalvular regurg, Mild MR, Severe LAE, Severe RV dilation and reduced function. No PFO. Mod TR, PA peak pressure 68 mm Hg - Echo 10/19: EF 55-60% mild LVH; s/p AVR with mean gradient   11 mmHg and trace AI; mild MR; severe biatrial enlargement; mild   RVE; severe TR; severe pulmonary hypertension. RVSP 70mHG - RHC 1/19 with mod/severe pulm HTN with RV failure.   - RHC 05/14/18 with Mild/Moderate PAH in setting of high output with no evidence of intracardiac shunting. - Ab u/s with cirrhosis but no evidence of portal HTN - Improved with Macitentan and  Adcirca. Now NYHA II  - Did not tolerate selexipag 400 bid due to myalgias. Will restart at 200 bid and see how he tolerates  - Volume status okay on exam.  - Continue torsemide 40 mg per day. Okay to take extra 20 mg PRN.  - Continue spiro 12.5 mg daily. BMET today.  - Continue midodrine 10 mg am and afternoon, 15 mg at night - Auto-immune serologies negative (checked twice) - ? HHT/shunt/AVMs with late  bubbles on bubble study.  - 6MW 12/19-  548 meters O2 Room air range 74-93%.   2. Chronic respiratory failure - [place] CT 05/16/18 with "dependent basilar predominant patchy subpleural reticulation and ground-glass attenuation with the suggestion of minimal associated traction bronchiolectasis. No frank honeycombing. Findings may indicate an interstitial lung disease such as early usual interstitial pneumonia (UIP) or fibrotic phase nonspecific interstitial pneumonia (NSIP)." - Saw Dr Lamonte Sakai in August. Started on albuterol.  3. Chronic AFL - Rate-controlled. Now off metoprolol. Continue coumadin with mechanical AVR.   4. CAD - s/p CABG 07/2017 with Dr Roderic Palau in [place].  - No s/s ischemia - continue ASA.  - Unable to tolerate atorva due to myalgias.  - Start Crestor 23m daily  5. S/p mechanical AVR - stable on most recent echo. Continue coumadin/ASA 81. INR goal is 2.5-3 per Dr MBettina Gavia - aware of need for SBE prophylaxis  6. Leukocytoclastic vasculitis - f/u with Rheumatology. No change.  7. Cirrhosis - UKoreaAbdomen RUQ 05/16/18 - s/p cholecystectomy. + hepatic cirrhosis. Mild ascites.  - Liver Doppler 05/16/18 - No hepatic, splenic, or portal venous thrombosis or occlusion. Mild ascites. - Continue midodrine 10 mg am, afternoon, 15 mg HS  8. Fall with R-sided trauma - he is on coumadin.  - if develops neuro symptoms will need head CT  DGlori Bickers MD  10:53 AM

## 2019-01-13 NOTE — Telephone Encounter (Signed)
Opened in error

## 2019-01-14 DIAGNOSIS — M4003 Postural kyphosis, cervicothoracic region: Secondary | ICD-10-CM | POA: Diagnosis not present

## 2019-01-14 DIAGNOSIS — M9903 Segmental and somatic dysfunction of lumbar region: Secondary | ICD-10-CM | POA: Diagnosis not present

## 2019-01-14 DIAGNOSIS — S29019A Strain of muscle and tendon of unspecified wall of thorax, initial encounter: Secondary | ICD-10-CM | POA: Diagnosis not present

## 2019-01-14 DIAGNOSIS — M542 Cervicalgia: Secondary | ICD-10-CM | POA: Diagnosis not present

## 2019-01-14 DIAGNOSIS — M9901 Segmental and somatic dysfunction of cervical region: Secondary | ICD-10-CM | POA: Diagnosis not present

## 2019-01-14 DIAGNOSIS — M4316 Spondylolisthesis, lumbar region: Secondary | ICD-10-CM | POA: Diagnosis not present

## 2019-01-14 DIAGNOSIS — M50322 Other cervical disc degeneration at C5-C6 level: Secondary | ICD-10-CM | POA: Diagnosis not present

## 2019-01-14 DIAGNOSIS — R293 Abnormal posture: Secondary | ICD-10-CM | POA: Diagnosis not present

## 2019-01-14 DIAGNOSIS — M9902 Segmental and somatic dysfunction of thoracic region: Secondary | ICD-10-CM | POA: Diagnosis not present

## 2019-01-20 ENCOUNTER — Telehealth: Payer: Self-pay | Admitting: Gastroenterology

## 2019-01-20 DIAGNOSIS — M542 Cervicalgia: Secondary | ICD-10-CM | POA: Diagnosis not present

## 2019-01-20 DIAGNOSIS — M50322 Other cervical disc degeneration at C5-C6 level: Secondary | ICD-10-CM | POA: Diagnosis not present

## 2019-01-20 DIAGNOSIS — M4003 Postural kyphosis, cervicothoracic region: Secondary | ICD-10-CM | POA: Diagnosis not present

## 2019-01-20 DIAGNOSIS — M4316 Spondylolisthesis, lumbar region: Secondary | ICD-10-CM | POA: Diagnosis not present

## 2019-01-20 DIAGNOSIS — S29019A Strain of muscle and tendon of unspecified wall of thorax, initial encounter: Secondary | ICD-10-CM | POA: Diagnosis not present

## 2019-01-20 DIAGNOSIS — M9901 Segmental and somatic dysfunction of cervical region: Secondary | ICD-10-CM | POA: Diagnosis not present

## 2019-01-20 DIAGNOSIS — M9902 Segmental and somatic dysfunction of thoracic region: Secondary | ICD-10-CM | POA: Diagnosis not present

## 2019-01-20 DIAGNOSIS — M9903 Segmental and somatic dysfunction of lumbar region: Secondary | ICD-10-CM | POA: Diagnosis not present

## 2019-01-20 DIAGNOSIS — R293 Abnormal posture: Secondary | ICD-10-CM | POA: Diagnosis not present

## 2019-01-20 NOTE — Telephone Encounter (Signed)
Pt was prescribed fluticasone prop cream.  Pt is confused by instructions because label instructs to "do not apply to rectum."  Please advise.

## 2019-01-20 NOTE — Telephone Encounter (Signed)
I have called patient and informed him that per Dr. Lyndel Safe he is to use the medication fluticasone cream 0.05% generic 30g 1 bid PR x 10 days.

## 2019-01-21 ENCOUNTER — Ambulatory Visit (INDEPENDENT_AMBULATORY_CARE_PROVIDER_SITE_OTHER): Payer: Medicare Other | Admitting: *Deleted

## 2019-01-21 ENCOUNTER — Telehealth: Payer: Self-pay | Admitting: Cardiology

## 2019-01-21 DIAGNOSIS — I4892 Unspecified atrial flutter: Secondary | ICD-10-CM | POA: Diagnosis not present

## 2019-01-21 DIAGNOSIS — Z7901 Long term (current) use of anticoagulants: Secondary | ICD-10-CM | POA: Diagnosis not present

## 2019-01-21 DIAGNOSIS — Z5181 Encounter for therapeutic drug level monitoring: Secondary | ICD-10-CM

## 2019-01-21 LAB — POCT INR: INR: 2.4 (ref 2.0–3.0)

## 2019-01-21 NOTE — Patient Instructions (Signed)
Description   Spoke with patient and advised to Continue taking 2.5 mg daily except for 5 mg on Tuesdays, Thursdays and Saturdays. Please call our office with any medication changes or concerns [phone]. Return in 3 weeks for INR check.

## 2019-01-21 NOTE — Telephone Encounter (Signed)
Wants to know who [facility] would recommend as a PC with [facility]

## 2019-01-22 NOTE — Telephone Encounter (Signed)
Spoke with patient and advised him to contact [facility] to schedule an appointment with [name], PA or Debbrah Alar, MD. Phone number provided to patient. Patient verbalized understanding. No further questions.

## 2019-01-27 DIAGNOSIS — M4003 Postural kyphosis, cervicothoracic region: Secondary | ICD-10-CM | POA: Diagnosis not present

## 2019-01-27 DIAGNOSIS — S29019A Strain of muscle and tendon of unspecified wall of thorax, initial encounter: Secondary | ICD-10-CM | POA: Diagnosis not present

## 2019-01-27 DIAGNOSIS — M542 Cervicalgia: Secondary | ICD-10-CM | POA: Diagnosis not present

## 2019-01-27 DIAGNOSIS — M9902 Segmental and somatic dysfunction of thoracic region: Secondary | ICD-10-CM | POA: Diagnosis not present

## 2019-01-27 DIAGNOSIS — M9903 Segmental and somatic dysfunction of lumbar region: Secondary | ICD-10-CM | POA: Diagnosis not present

## 2019-01-27 DIAGNOSIS — M4316 Spondylolisthesis, lumbar region: Secondary | ICD-10-CM | POA: Diagnosis not present

## 2019-01-27 DIAGNOSIS — R293 Abnormal posture: Secondary | ICD-10-CM | POA: Diagnosis not present

## 2019-01-27 DIAGNOSIS — M9901 Segmental and somatic dysfunction of cervical region: Secondary | ICD-10-CM | POA: Diagnosis not present

## 2019-01-27 DIAGNOSIS — M50322 Other cervical disc degeneration at C5-C6 level: Secondary | ICD-10-CM | POA: Diagnosis not present

## 2019-01-28 ENCOUNTER — Ambulatory Visit (HOSPITAL_BASED_OUTPATIENT_CLINIC_OR_DEPARTMENT_OTHER)
Admission: RE | Admit: 2019-01-28 | Discharge: 2019-01-28 | Disposition: A | Discharge status: Home / Self Care | Payer: Medicare Other | Source: Ambulatory Visit | Attending: Medical | Admitting: Medical

## 2019-01-28 ENCOUNTER — Telehealth: Payer: Self-pay

## 2019-01-28 ENCOUNTER — Encounter: Payer: Self-pay | Admitting: Medical

## 2019-01-28 ENCOUNTER — Ambulatory Visit (INDEPENDENT_AMBULATORY_CARE_PROVIDER_SITE_OTHER): Payer: Medicare Other | Admitting: Medical

## 2019-01-28 VITALS — BP 127/67 | HR 55 | Temp 97.6°F | Resp 12 | Ht 68.5 in | Wt 149.0 lb

## 2019-01-28 DIAGNOSIS — R0781 Pleurodynia: Secondary | ICD-10-CM

## 2019-01-28 DIAGNOSIS — M25561 Pain in right knee: Secondary | ICD-10-CM | POA: Diagnosis not present

## 2019-01-28 DIAGNOSIS — I25119 Atherosclerotic heart disease of native coronary artery with unspecified angina pectoris: Secondary | ICD-10-CM | POA: Diagnosis not present

## 2019-01-28 DIAGNOSIS — I2510 Atherosclerotic heart disease of native coronary artery without angina pectoris: Secondary | ICD-10-CM

## 2019-01-28 DIAGNOSIS — Z951 Presence of aortocoronary bypass graft: Secondary | ICD-10-CM

## 2019-01-28 DIAGNOSIS — E785 Hyperlipidemia, unspecified: Secondary | ICD-10-CM

## 2019-01-28 DIAGNOSIS — S2231XA Fracture of one rib, right side, initial encounter for closed fracture: Secondary | ICD-10-CM | POA: Diagnosis not present

## 2019-01-28 DIAGNOSIS — J449 Chronic obstructive pulmonary disease, unspecified: Secondary | ICD-10-CM | POA: Diagnosis not present

## 2019-01-28 DIAGNOSIS — M79651 Pain in right thigh: Secondary | ICD-10-CM

## 2019-01-28 DIAGNOSIS — Z0279 Encounter for issue of other medical certificate: Secondary | ICD-10-CM

## 2019-01-28 DIAGNOSIS — S46819A Strain of other muscles, fascia and tendons at shoulder and upper arm level, unspecified arm, initial encounter: Secondary | ICD-10-CM | POA: Diagnosis not present

## 2019-01-28 MED ORDER — CYCLOBENZAPRINE HCL 5 MG PO TABS: 5.0000 mg | ORAL_TABLET | Freq: Every day | ORAL | 0 refills | Status: DC

## 2019-01-28 NOTE — Patient Instructions (Signed)
Nice to meet you today.  For recent fall and persisting bilateral trapezius pain, I did prescribe you Flexeril muscle relaxant to use just at night.  Rx advisement regarding sedation.  You can take Tylenol to help with the pain as well.  Unfortunately you cannot take any NSAIDs.  No mid cervical spine area pain so did not do x-ray today.  For right rib region pain post fall, will get chest x-ray with right rib series.  We may find a healing right rib fracture.  You had good range of motion of right hip with no pain.  Also on exam your right thigh region pain appears to be resolved.  Knee had good range of motion as well.  No evaluation of hip or knee needed.  Appears that you have a contusion to your right side cheek/maxillary area.  No tenderness on exam.  The area looks well-healed.   Reviewed your chronic medical problems today.  Advise continue current medications as prescribed by your specialist.  Follow-up in 2 weeks or as needed.

## 2019-01-28 NOTE — Telephone Encounter (Signed)
PA initiated via Covermymeds; KEY: [id number]. Awaiting determination.

## 2019-01-28 NOTE — Progress Notes (Signed)
Subjective:    Patient ID: Lance Hicks, male    DOB: [birth year], 73 y.o.   MRN: [id number]  HPI   Pt in for first time.  Pt used to work as Higher education careers adviser in [place], pt works out 3-4 times a week. Pt used to smoke up until 2002.   Pt has hx of atrial fibrillation and flutter. CAD. Hx of mechanical valve replacement. Dr. Bettina Gavia follows his INR.   Hx of copd, anxiety,  hyperlipidemia, htn and kidney disease.  Per chart review some chf per dx inidiction for chf. LV EF 55-60%.  For high cholesterol he is on crestor.   Pt is on midrodine to increase blood pressure.  Hx of gerd. Is on protonix.     2 weeks ago he tripped on rug going to bathroom. He fell on his rt side and landed on his rt rib. Since the fall his trapezius muscles feel tight and tender. He states hit animal crate near rt cheek area.  No mid neck pain and no radiating pain to arms.  His also hit his rt thigh and rt knee. Those area now feel better.  Pt states muscle feel tight since the fall.  He feels achiness mostly in neck.     Review of Systems  Constitutional: Negative for chills, fatigue and fever.  HENT: Negative for congestion, drooling, ear pain, postnasal drip and sinus pain.   Eyes: Negative for photophobia and pain.  Respiratory: Negative for cough, chest tightness, shortness of breath and wheezing.   Cardiovascular: Negative for chest pain and palpitations.  Gastrointestinal: Negative for abdominal pain, diarrhea, nausea and vomiting.  Genitourinary: Negative for dysuria and enuresis.  Musculoskeletal: Negative for back pain, joint swelling, neck pain and neck stiffness.       Rt rib pain. Neck/trapezius pain.  Skin: Negative for rash.  Neurological: Negative for dizziness, seizures and headaches.  Hematological: Negative for adenopathy. Does not bruise/bleed easily.  Psychiatric/Behavioral: Negative for behavioral problems, confusion and sleep disturbance. The patient is not nervous/anxious.      Past Medical History:  Diagnosis Date  . 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 In light of concurrent left nephrolithiasis, will check CT renal colic next year instead of renal US.   [name] Anticoagulated on Coumadin 01/04/2018  . Anxiety 05/10/2016   Last Assessment & Plan:  Doing well off of zoloft.  . Asymptomatic microscopic hematuria 06/20/2017   Last Assessment & Plan:  Had hematuria workup in [facility] in 2016 which negative CT and cystoscopy. UA with 2+ blood last visit - we discussed recommendation for repeat workup at 5 years or if degree of hematuria progresses.   . Atrial fibrillation ([place]) 03/29/2017  . Atrial flutter ([place]) 03/29/2017  . Chronic allergic rhinitis 04/28/2016   Last Assessment & Plan:  Continue astelin  . Chronic anticoagulation 03/29/2017  . Chronic atrial fibrillation 07/23/2015   Last Assessment & Plan:  Coumadin and metoprolol, cardiology referral to establish care.  . Chronic midline back pain 12/14/2015   Last Assessment & Plan:  Pain management referral for further evaluation.  . Chronic prostatitis 07/23/2015   Last Assessment & Plan:  Has largely resolved since stopping bike riding. Recommend annual DRE AND PSA - will see back 12/2015 for annual screening, given 1st degree fhx. To call office for recurrent prostatitis symptoms.   [name] COPD (chronic obstructive pulmonary disease) ([place]) 06/12/2016  . Coronary artery disease involving native  coronary artery of native heart with angina pectoris ([place]) 03/29/2017  . Cough 10/17/2016   Last Assessment & Plan:  Discussed typical course for acute viral illness. If symptoms worsen or fail to improve by 7-10d, delayed ATBs, fluids, rest, NSAIDs/APAP prn. Seek care if not improving. Needs earlier INR check due to ATBs.  [name] Dyspnea 02/01/2016   Last Assessment & Plan:  Overall improving, eval by pulm, plan for CT, neg stress test with cardiology. Recent switch to carvedilol due to  side effects.  . Epidermoid cyst of skin 08/24/2017  . Essential hypertension 12/14/2015   Last Assessment & Plan:  Hypertension control: controlled  Medications: compliant Medication Management: as noted in orders Home blood pressure monitoring recommended additionally as needed for symptoms  The patient's care plan was reviewed and updated. Instructions and counseling were provided regarding patient goals and barriers. He was counseled to adopt a healthy lifestyle. Educational resources and self-management tools have been provided as charted in [facility] list.   . H/O maze procedure 03/29/2017  . [name] 03/29/2017   Overview:  2011  . Hx of CABG 03/29/2017  . Hyperlipidemia 03/29/2017  . Hypertensive heart disease 03/29/2017  . Kidney stones 07/23/2015   Overview:  x 3  Last Assessment & Plan:  By [place] has left nephrolithiasis, but not visible by KUB. Will check CT renal colic next year to assess both stone burden as well as to surveil AML.   . Leukocytoclastic vasculitis ([place]) 10/01/2017  . Localized edema 01/04/2018  . Lumbar radicular pain 01/19/2016  . Maculopapular rash 09/03/2017  . Nephrolithiasis 07/23/2015   Overview:  x 3  Last Assessment & Plan:  By [place] has left nephrolithiasis, but not visible by KUB. Will check CT renal colic next year to assess both stone burden as well as to surveil AML.   Overview:  x 3  Last Assessment & Plan:  Has 85m nonobstructing LUP stone - not visible by KUB.  Will check renal [place] - he will contact office sooner if symptomatic.   . Non-sustained ventricular tachycardia ([place] 03/29/2017  . Other hyperlipidemia 03/29/2017  . Palpitations 10/01/2017  . Paroxysmal atrial fibrillation ([place] 03/29/2017  . Paroxysmal atrial fibrillation ([place] 03/29/2017  . Pleural effusion, bilateral 08/09/2017  . Post-nasal drainage 02/25/2016   Last Assessment & Plan:  Trial zyrtec and flonase  . Prostate cancer screening 06/20/2017   Last Assessment & Plan:  Recommend  continued annual CaP screening until within 10 years of life expectancy. Given good health and fhx of longevity, would anticipate CaP screening to continue until age 73  PSA today and again in one year on day of visit.  . Pulmonary hypertension ([place] 08/09/2017  . Pulmonary nodules 06/12/2016  . S/P AVR (aortic valve replacement) 03/20/2016  . Supratherapeutic INR 07/26/2017  . Syncope 03/29/2017  . Syncope and collapse 02/01/2016  . Typical atrial flutter ([place] 02/01/2016     Social History   Socioeconomic History  . Marital status: Single    Spouse name: Not on file  . Number of children: Not on file  . Years of education: Not on file  . Highest education level: Not on file  Occupational History  . Not on file  Social Needs  . Financial resource strain: Not on file  . Food insecurity:    Worry: Not on file    Inability: Not on file  . Transportation needs:    Medical: Not on file    Non-medical:  Not on file  Tobacco Use  . Smoking status: Former Smoker    Packs/day: 2.00    Years: 34.00    Pack years: 68.00    Types: Cigarettes    Last attempt to quit: 07/16/2000    Years since quitting: 18.5  . Smokeless tobacco: Never Used  Substance and Sexual Activity  . Alcohol use: Yes  . Drug use: No  . Sexual activity: Not on file  Lifestyle  . Physical activity:    Days per week: Not on file    Minutes per session: Not on file  . Stress: Not on file  Relationships  . Social connections:    Talks on phone: Not on file    Gets together: Not on file    Attends religious service: Not on file    Active member of club or organization: Not on file    Attends meetings of clubs or organizations: Not on file    Relationship status: Not on file  . Intimate partner violence:    Fear of current or ex partner: Not on file    Emotionally abused: Not on file    Physically abused: Not on file    Forced sexual activity: Not on file  Other Topics Concern  . Not on file  Social History  Narrative  . Not on file    Past Surgical History:  Procedure Laterality Date  . CHOLECYSTECTOMY    . CORONARY ARTERY BYPASS GRAFT    . EXPLORATORY LAPAROTOMY  07/30/2017  . FOOT SURGERY    . FRACTURE SURGERY Right    wrist and forearm  . HERNIA REPAIR    . MECHANICAL AORTIC VALVE REPLACEMENT    . NASAL SINUS SURGERY    . RIGHT HEART CATH N/A 01/18/2018   Procedure: RIGHT HEART CATH;  Surgeon: Jolaine Artist, MD;  Location: [place] [facility];  Service: Cardiovascular;  Laterality: N/A;  . RIGHT HEART CATH N/A 05/14/2018   Procedure: RIGHT HEART CATH;  Surgeon: Jolaine Artist, MD;  Location: [place] [facility];  Service: Cardiovascular;  Laterality: N/A;  . TEE WITHOUT CARDIOVERSION N/A 05/21/2018   Procedure: TRANSESOPHAGEAL ECHOCARDIOGRAM (TEE);  Surgeon: Jolaine Artist, MD;  Location: [facility] ENDOSCOPY;  Service: Cardiovascular;  Laterality: N/A;  . UPPER GASTROINTESTINAL ENDOSCOPY  07/12/2017   Patchy areas of mucosal inflammation noted in the antrum with edema,erthema and ulcerations. Bx. Chronicfocally active gastritis.  [name] VASECTOMY      Family History  Problem Relation Age of Onset  . Asthma Mother   . Arthritis Mother   . Heart attack Father   . Hypertension Father   . Stroke Paternal Grandmother   . Colon cancer Neg Hx     Allergies  Allergen Reactions  . Amoxicillin-Pot Clavulanate Diarrhea    Stomach pain Has patient had a PCN reaction causing immediate rash, facial/tongue/throat swelling, SOB or lightheadedness with hypotension: No Has patient had a PCN reaction causing severe rash involving mucus membranes or skin necrosis: No Has patient had a PCN reaction that required hospitalization: No Has patient had a PCN reaction occurring within the last 10 years: Yes If all of the above answers are "NO", then may proceed with Cephalosporin use.   . Tape Rash and Other (See Comments)    Surgical tape    Current Outpatient Medications on File Prior to  Visit  Medication Sig Dispense Refill  . acetaminophen (TYLENOL) 650 MG CR tablet Take 650 mg by mouth every 8 (eight) hours  as needed for pain.    [name] albuterol (PROVENTIL HFA;VENTOLIN HFA) 108 (90 Base) MCG/ACT inhaler Inhale 2 puffs into the lungs as needed for wheezing or shortness of breath.    [name] aspirin EC 81 MG tablet Take 81 mg by mouth daily.    . B Complex-C (B-COMPLEX WITH VITAMIN C) tablet Take 1 tablet by mouth daily.     . cetirizine (ZYRTEC ALLERGY) 10 MG tablet Take 10 mg by mouth daily as needed for allergies.     . Coenzyme Q10 (COQ10 PO) Take 1 tablet by mouth daily.    [name] ENSURE (ENSURE) Take 237 mLs by mouth every other day.    . macitentan (OPSUMIT) 10 MG tablet Take 1 tablet (10 mg total) by mouth daily. 30 tablet 0  . midodrine (PROAMATINE) 10 MG tablet Take 1 tablet (10 mg total) by mouth 3 (three) times daily with meals. 90 tablet 6  . multivitamin-lutein (OCUVITE-LUTEIN) CAPS capsule Take 1 capsule by mouth daily.    . pantoprazole (PROTONIX) 40 MG tablet Take 1 tablet (40 mg total) by mouth daily. 30 tablet 11  . rosuvastatin (CRESTOR) 5 MG tablet Take 1 tablet (5 mg total) by mouth daily. 30 tablet 6  . spironolactone (ALDACTONE) 25 MG tablet Take 0.5 tablets (12.5 mg total) by mouth daily. 45 tablet 3  . tadalafil, PAH, (ADCIRCA) 20 MG tablet TAKE 2 TABLETS BY MOUTH ONCE A DAY 60 tablet 12  . torsemide (DEMADEX) 20 MG tablet Take 20 mg by mouth 2 (two) times daily as needed (will take 3 times a day if needed).     . warfarin (COUMADIN) 5 MG tablet As directed by coumadin clinic 60 tablet 0  . potassium chloride (K-DUR) 10 MEQ tablet Take 2 tablets (20 mEq total) by mouth daily. 184 tablet 2   No current facility-administered medications on file prior to visit.     BP 127/67 (BP Location: Left Arm, Patient Position: Sitting, Cuff Size: Normal)   Pulse (!) 55   Temp 97.6 F (36.4 C)   Resp 12   Ht 5' 8.5" (1.74 m)   Wt 149 lb (67.6 kg)   SpO2 100%   BMI 22.33  kg/m       Objective:   Physical Exam  General Mental Status- Alert. General Appearance- Not in acute distress.   Skin General: Color- Normal Color. Moisture- Normal Moisture.  heent- negative but mild bruise over rt cheek area. No tenderness to palpation.   Chest and Lung Exam Auscultation: Breath Sounds:-Normal.  Cardiovascular Auscultation:Rythm- Regular. Murmurs & Other Heart Sounds:Auscultation of the heart reveals- No Murmurs.  Abdomen Inspection:-Inspeection Normal. Palpation/Percussion:Note:No mass. Palpation and Percussion of the abdomen reveal- Non Tender, Non Distended + BS, no rebound or guarding.   Neurologic Cranial Nerve exam:- CN III-XII intact(No nystagmus), symmetric smile. Strength:- 5/5 equal and symmetric strength both upper and lower extremities.  Shoulders- bilateral no pain on rom today. Neck-  No mid c spine tenderness. Bilateral neck tenderness to palpation.  Rt rib- mid region tenderness. Region of axillary area.  Rt hip and thigh- no tenderness to palpation. Rt knee- good range of motion. No crepitus.        Assessment & Plan:  Nice to meet you today.  For recent fall and persisting bilateral trapezius pain, I did prescribe you Flexeril muscle relaxant to use just at night.  Rx advisement regarding sedation.  You can take Tylenol to help with the pain as well.  Unfortunately you cannot  take any NSAIDs.  No mid cervical spine area pain so did not do x-ray today.  For right rib region pain post fall, will get chest x-ray with right rib series.  We may find a healing right rib fracture.  You had good range of motion of right hip with no pain.  Also on exam your right thigh region pain appears to be resolved.  Knee had good range of motion as well.  No evaluation of hip or knee needed.  Appears that you have a contusion to your right side cheek/maxillary area.  No tenderness on exam.  The area looks well-healed.   Reviewed your chronic  medical problems today.  Advise continue current medications as prescribed by your specialist.  Follow-up in 2 weeks or as needed.  [name], PA-C

## 2019-01-28 NOTE — Telephone Encounter (Signed)
PA approved.  [id number];COBTVM:[id number];Review Type:Prior Auth;Coverage Start Date:12/29/2018;Coverage End Date:01/28/2020

## 2019-01-30 ENCOUNTER — Other Ambulatory Visit: Payer: Self-pay | Admitting: Medical

## 2019-01-30 ENCOUNTER — Telehealth: Payer: Self-pay | Admitting: Medical

## 2019-01-30 DIAGNOSIS — M9902 Segmental and somatic dysfunction of thoracic region: Secondary | ICD-10-CM | POA: Diagnosis not present

## 2019-01-30 DIAGNOSIS — R293 Abnormal posture: Secondary | ICD-10-CM | POA: Diagnosis not present

## 2019-01-30 DIAGNOSIS — M9901 Segmental and somatic dysfunction of cervical region: Secondary | ICD-10-CM | POA: Diagnosis not present

## 2019-01-30 DIAGNOSIS — M4003 Postural kyphosis, cervicothoracic region: Secondary | ICD-10-CM | POA: Diagnosis not present

## 2019-01-30 DIAGNOSIS — M9903 Segmental and somatic dysfunction of lumbar region: Secondary | ICD-10-CM | POA: Diagnosis not present

## 2019-01-30 DIAGNOSIS — M4316 Spondylolisthesis, lumbar region: Secondary | ICD-10-CM | POA: Diagnosis not present

## 2019-01-30 DIAGNOSIS — M50322 Other cervical disc degeneration at C5-C6 level: Secondary | ICD-10-CM | POA: Diagnosis not present

## 2019-01-30 DIAGNOSIS — M542 Cervicalgia: Secondary | ICD-10-CM | POA: Diagnosis not present

## 2019-01-30 DIAGNOSIS — S29019A Strain of muscle and tendon of unspecified wall of thorax, initial encounter: Secondary | ICD-10-CM | POA: Diagnosis not present

## 2019-01-30 MED ORDER — HYDROCODONE-ACETAMINOPHEN 5-325 MG PO TABS: 1.0000 | ORAL_TABLET | Freq: Four times a day (QID) | ORAL | 0 refills | Status: DC | PRN

## 2019-01-30 NOTE — Telephone Encounter (Signed)
Requested medication (s) are due for refill today: not specified  Requested medication (s) are on the active medication list: yes    Last refill: 01/15/18  Future visit scheduled yes  02/11/2019  Notes to clinic:historical provider  Requested Prescriptions  Pending Prescriptions Disp Refills   acetaminophen (TYLENOL) 650 MG CR tablet      Sig: Take 1 tablet (650 mg total) by mouth every 8 (eight) hours as needed for pain.     Over the Counter:  OTC Passed - 01/30/2019  5:08 PM      Passed - Valid encounter within last 12 months    Recent Outpatient Visits          2 days ago Strain of trapezius muscle, unspecified laterality, initial encounter   Archivist at [place], [employer]            In 1 week Saguier, Percell Miller, PA-C [employer] at [employer], [facility]

## 2019-01-30 NOTE — Telephone Encounter (Signed)
Rx of Norco sent to patient's pharmacy for rib fracture.

## 2019-01-30 NOTE — Telephone Encounter (Signed)
Copied from [place] [id number]. Topic: Quick Communication - Rx Refill/Question >> Jan 30, 2019  4:53 PM Keene Breath wrote: Medication: acetaminophen (TYLENOL) 650 MG CR tablet  Patient called to request a refill for the above medication  Preferred Pharmacy (with phone number or street name): [facility] [place], [address] AT [place] [phone] (Phone) [phone] (Fax)

## 2019-01-31 ENCOUNTER — Telehealth (HOSPITAL_COMMUNITY): Payer: Self-pay

## 2019-01-31 NOTE — Telephone Encounter (Signed)
Prior authorization through [employer] insurance company was initiated for opsumit medication and sent via [place] on 01/31/2019.  Prior authorization through [employer] insurance company was APPROVED for opsumit and will expire on 01/31/2020.

## 2019-02-02 MED ORDER — ACETAMINOPHEN ER 650 MG PO TBCR: 650.0000 mg | EXTENDED_RELEASE_TABLET | Freq: Three times a day (TID) | ORAL | 0 refills | Status: DC | PRN

## 2019-02-02 NOTE — Telephone Encounter (Signed)
Rx tylenol sent to pt pharmacy.

## 2019-02-05 DIAGNOSIS — M4316 Spondylolisthesis, lumbar region: Secondary | ICD-10-CM | POA: Diagnosis not present

## 2019-02-05 DIAGNOSIS — M4003 Postural kyphosis, cervicothoracic region: Secondary | ICD-10-CM | POA: Diagnosis not present

## 2019-02-05 DIAGNOSIS — M542 Cervicalgia: Secondary | ICD-10-CM | POA: Diagnosis not present

## 2019-02-05 DIAGNOSIS — M9901 Segmental and somatic dysfunction of cervical region: Secondary | ICD-10-CM | POA: Diagnosis not present

## 2019-02-05 DIAGNOSIS — R293 Abnormal posture: Secondary | ICD-10-CM | POA: Diagnosis not present

## 2019-02-05 DIAGNOSIS — M50322 Other cervical disc degeneration at C5-C6 level: Secondary | ICD-10-CM | POA: Diagnosis not present

## 2019-02-05 DIAGNOSIS — S29019A Strain of muscle and tendon of unspecified wall of thorax, initial encounter: Secondary | ICD-10-CM | POA: Diagnosis not present

## 2019-02-05 DIAGNOSIS — M9903 Segmental and somatic dysfunction of lumbar region: Secondary | ICD-10-CM | POA: Diagnosis not present

## 2019-02-05 DIAGNOSIS — M9902 Segmental and somatic dysfunction of thoracic region: Secondary | ICD-10-CM | POA: Diagnosis not present

## 2019-02-07 IMAGING — US US ABDOMEN LIMITED
1 series · 14 of 25 positions shown · non-contrast
Comparison: None.

CLINICAL DATA: Hepatic cirrhosis.

EXAM:
ULTRASOUND ABDOMEN LIMITED RIGHT UPPER QUADRANT

[Series 1: us abdomen limited · 0.21mm/px · 14 of 30 slices shown]
[im 1/30]
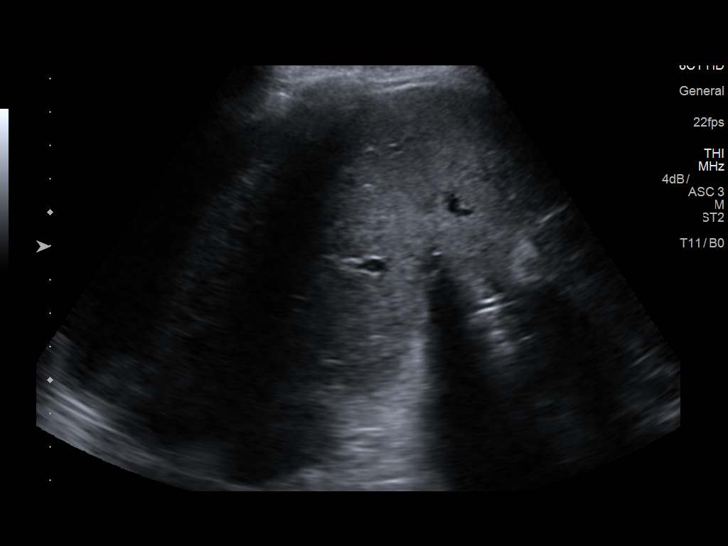
[im 3/30]
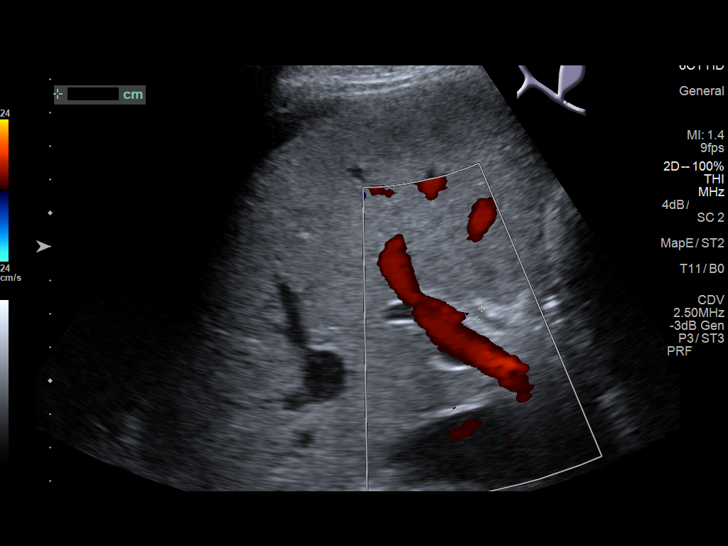
[im 5/30]
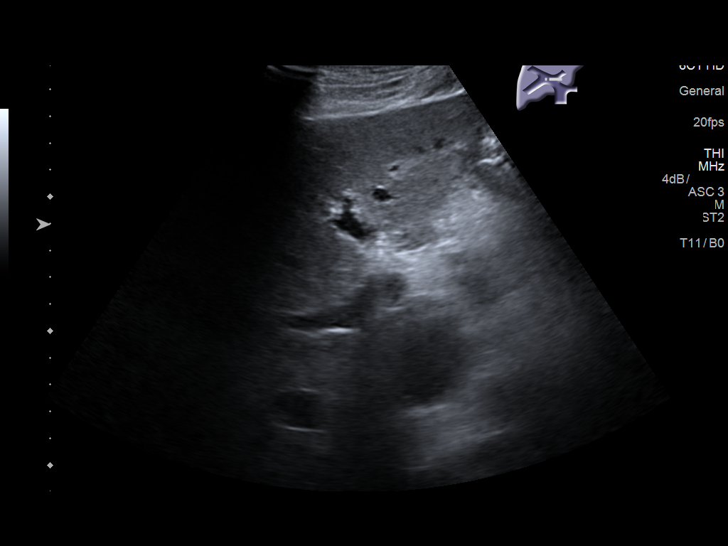
[im 8/30]
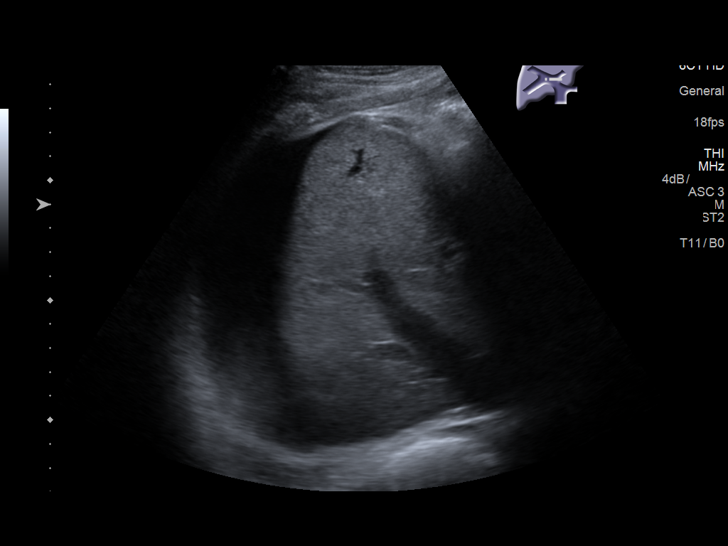
[im 10/30]
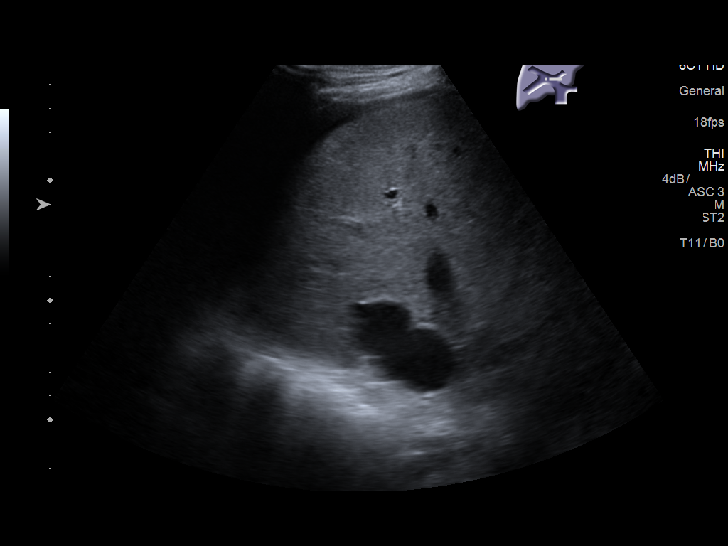
[im 11/30]
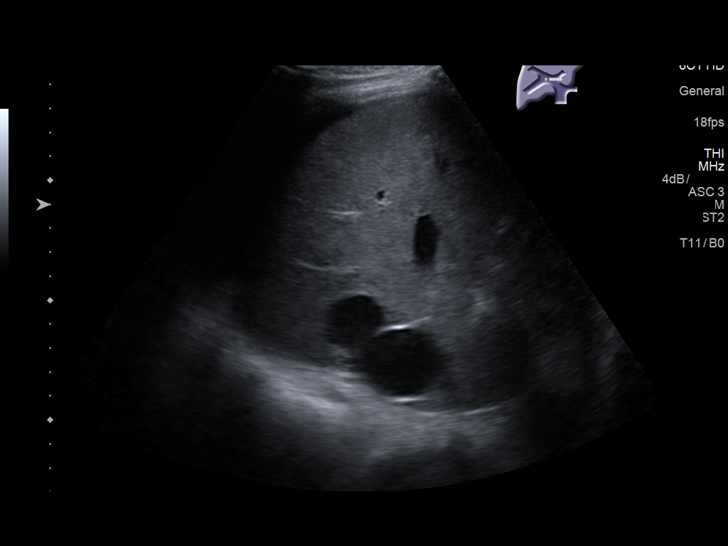
[im 14/30]
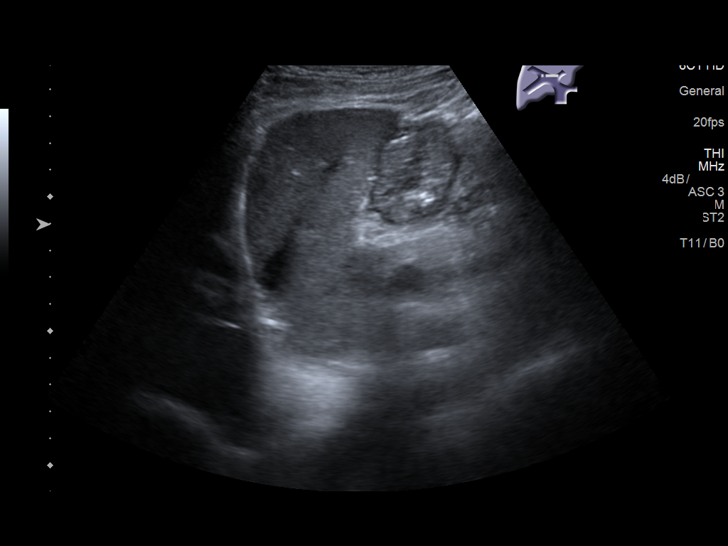
[im 16/30]
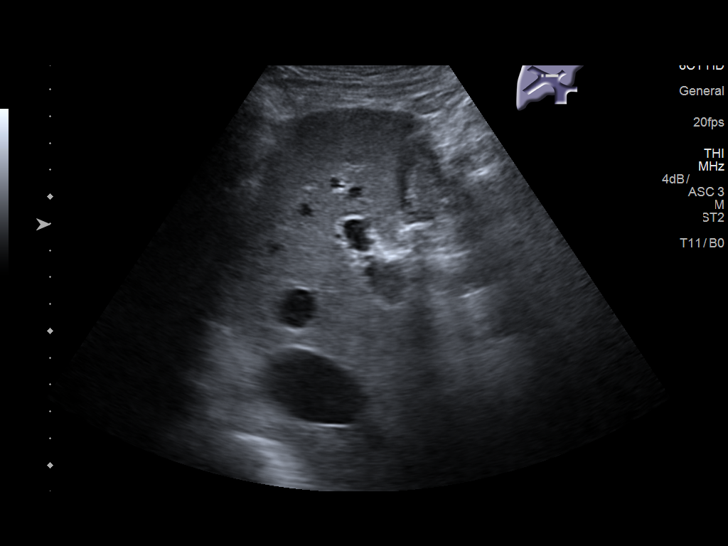
[im 19/30]
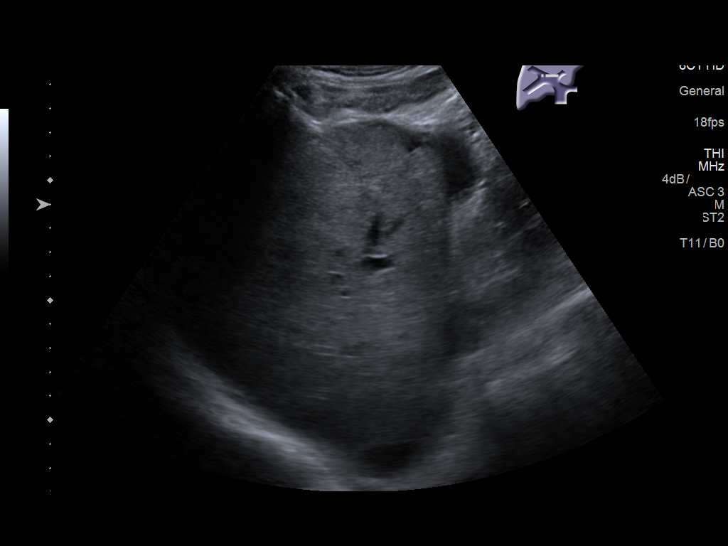
[im 20/30]
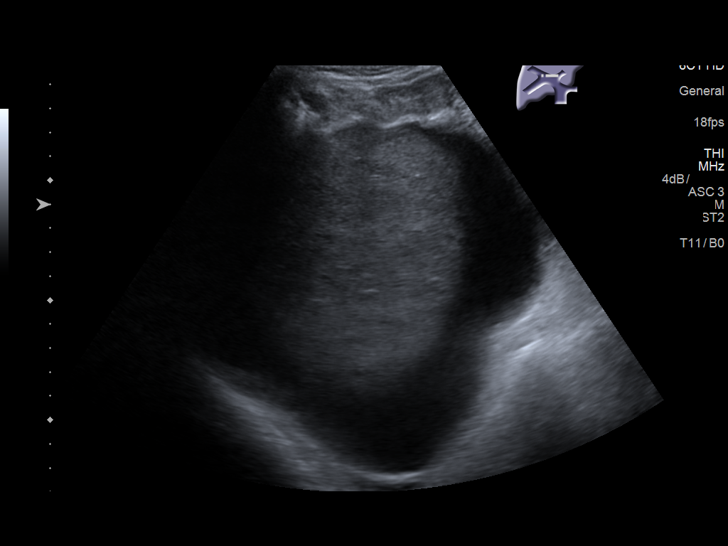
[im 22/30]
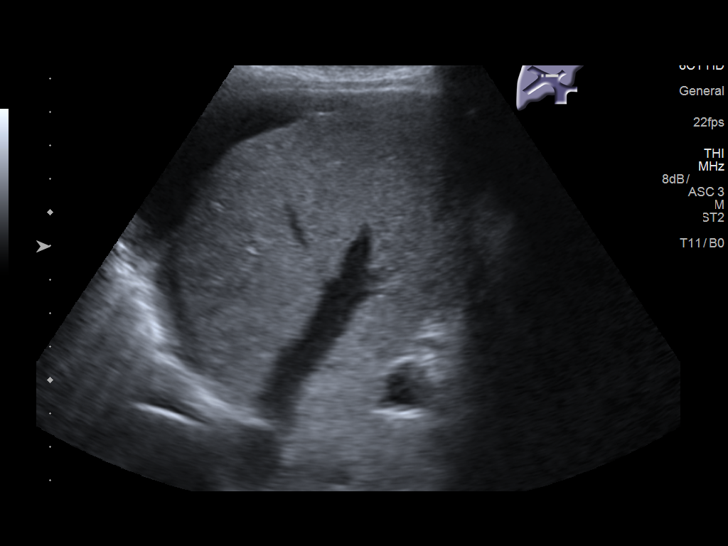
[im 25/30]
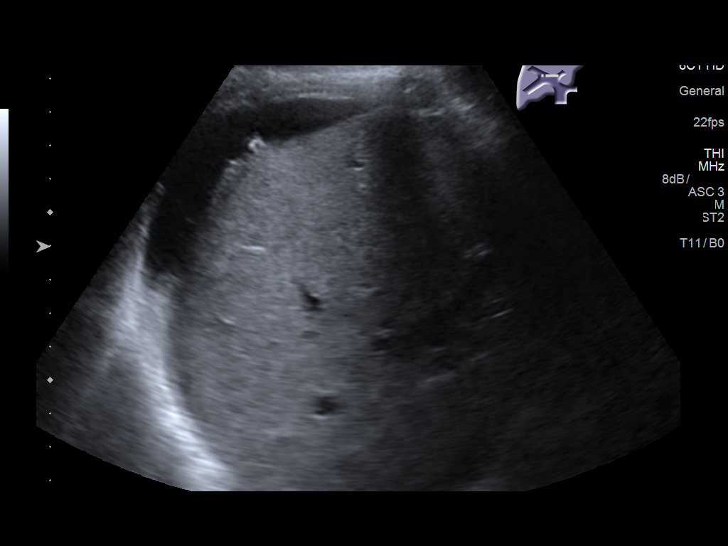
[im 27/30]
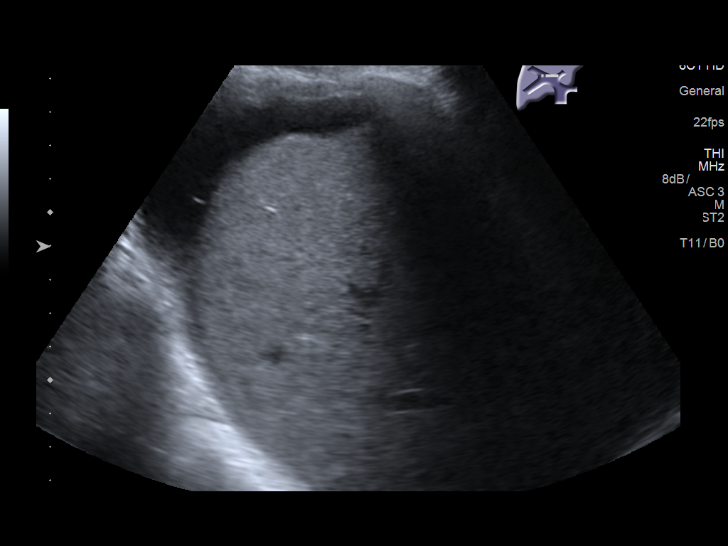
[im 30/30]
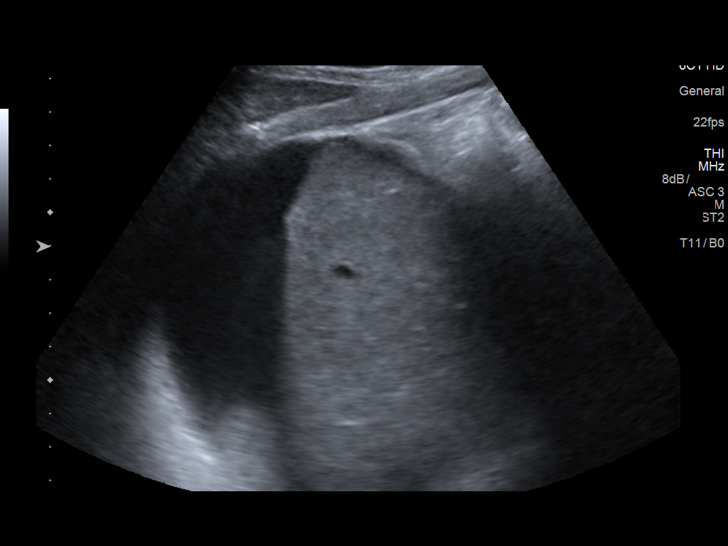

[14 of 25 positions shown; findings below may reference images not displayed]

FINDINGS: Gallbladder:

Status post cholecystectomy.

Common bile duct:

Diameter: 3.5 mm which is within normal limits.

Liver:

No focal lesion identified. Mildly heterogeneous echotexture of
hepatic parenchyma is noted with nodular hepatic contours suggesting
cirrhosis. Mild ascites is noted. Portal vein is patent on color
Doppler imaging with normal direction of blood flow towards the
liver.
IMPRESSION: Status post cholecystectomy. Findings consistent with hepatic
cirrhosis. Mild ascites is noted around the liver. No focal
abnormality is noted.

## 2019-02-11 ENCOUNTER — Ambulatory Visit (INDEPENDENT_AMBULATORY_CARE_PROVIDER_SITE_OTHER): Payer: Medicare Other | Admitting: Medical

## 2019-02-11 ENCOUNTER — Encounter: Payer: Self-pay | Admitting: Medical

## 2019-02-11 ENCOUNTER — Ambulatory Visit (HOSPITAL_BASED_OUTPATIENT_CLINIC_OR_DEPARTMENT_OTHER)
Admission: RE | Admit: 2019-02-11 | Discharge: 2019-02-11 | Disposition: A | Discharge status: Home / Self Care | Payer: Medicare Other | Source: Ambulatory Visit | Attending: Medical | Admitting: Medical

## 2019-02-11 ENCOUNTER — Telehealth: Payer: Self-pay | Admitting: Medical

## 2019-02-11 VITALS — BP 124/68 | HR 54 | Temp 98.2°F | Resp 16 | Ht 68.5 in | Wt 151.2 lb

## 2019-02-11 DIAGNOSIS — M47812 Spondylosis without myelopathy or radiculopathy, cervical region: Secondary | ICD-10-CM | POA: Diagnosis not present

## 2019-02-11 DIAGNOSIS — M542 Cervicalgia: Secondary | ICD-10-CM

## 2019-02-11 DIAGNOSIS — I2510 Atherosclerotic heart disease of native coronary artery without angina pectoris: Secondary | ICD-10-CM

## 2019-02-11 DIAGNOSIS — S46811A Strain of other muscles, fascia and tendons at shoulder and upper arm level, right arm, initial encounter: Secondary | ICD-10-CM | POA: Diagnosis not present

## 2019-02-11 DIAGNOSIS — M50322 Other cervical disc degeneration at C5-C6 level: Secondary | ICD-10-CM | POA: Diagnosis not present

## 2019-02-11 MED ORDER — TIZANIDINE HCL 6 MG PO CAPS: ORAL_CAPSULE | ORAL | 0 refills | Status: DC

## 2019-02-11 NOTE — Progress Notes (Signed)
Subjective:    Patient ID: Lance Hicks, male    DOB: October 19, 1946, 73 y.o.   MRN: [id number]  HPI  Pt in for follow up.  Pt states his neck still feels tight and tender Pt states pain more on rt side. When turns his head to rt will have pain. Pt did see chiropracter 6 days ago. He had adjustment that helped only one day then next day felt the same. Pt states ice will help neck pain.  Hx of rt rotator cuff injury in pain. But has good range of motion.  On last visit did not do c spine xray.   Pt had recent rib fracture after fall. He states pain level is tapering off.  Pt can't take nsaid due to coumadin [place]. For neck pain I rx'd flexeril. But did not help much.    Review of Systems  Constitutional: Negative for chills, fatigue and fever.  Respiratory: Negative for cough, chest tightness and wheezing.   Cardiovascular: Negative for chest pain and palpitations.  Gastrointestinal: Negative for abdominal pain.  Genitourinary: Negative for dysuria and flank pain.  Musculoskeletal: Positive for neck pain.  Skin: Negative for rash.  Neurological: Negative for dizziness, speech difficulty, weakness and light-headedness.  Hematological: Negative for adenopathy. Does not bruise/bleed easily.  Psychiatric/Behavioral: Negative for behavioral problems and confusion.    Past Medical History:  Diagnosis Date  . 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 In light of concurrent left nephrolithiasis, will check CT renal colic next year instead of renal US.   [name] Anticoagulated on Coumadin 01/04/2018  . Anxiety 05/10/2016   Last Assessment & Plan:  Doing well off of zoloft.  . Asymptomatic microscopic hematuria 06/20/2017   Last Assessment & Plan:  Had hematuria workup in [facility] in 2016 which negative CT and cystoscopy. UA with 2+ blood last visit - we discussed recommendation for repeat workup at 5 years or if degree of hematuria  progresses.   . Atrial fibrillation ([place]) 03/29/2017  . Atrial flutter ([place]) 03/29/2017  . Chronic allergic rhinitis 04/28/2016   Last Assessment & Plan:  Continue astelin  . Chronic anticoagulation 03/29/2017  . Chronic atrial fibrillation 07/23/2015   Last Assessment & Plan:  Coumadin and metoprolol, cardiology referral to establish care.  . Chronic midline back pain 12/14/2015   Last Assessment & Plan:  Pain management referral for further evaluation.  . Chronic prostatitis 07/23/2015   Last Assessment & Plan:  Has largely resolved since stopping bike riding. Recommend annual DRE AND PSA - will see back 12/2015 for annual screening, given 1st degree fhx. To call office for recurrent prostatitis symptoms.   [name] COPD (chronic obstructive pulmonary disease) ([place]) 06/12/2016  . Coronary artery disease involving native coronary artery of native heart with angina pectoris ([place]) 03/29/2017  . Cough 10/17/2016   Last Assessment & Plan:  Discussed typical course for acute viral illness. If symptoms worsen or fail to improve by 7-10d, delayed ATBs, fluids, rest, NSAIDs/APAP prn. Seek care if not improving. Needs earlier INR check due to ATBs.  [name] Dyspnea 02/01/2016   Last Assessment & Plan:  Overall improving, eval by pulm, plan for CT, neg stress test with cardiology. Recent switch to carvedilol due to side effects.  . Epidermoid cyst of skin 08/24/2017  . Essential hypertension 12/14/2015   Last Assessment & Plan:  Hypertension control: controlled  Medications: compliant Medication Management: as noted in orders Home blood  pressure monitoring recommended additionally as needed for symptoms  The patient's care plan was reviewed and updated. Instructions and counseling were provided regarding patient goals and barriers. He was counseled to adopt a healthy lifestyle. Educational resources and self-management tools have been provided as charted in [facility] list.   . H/O maze procedure 03/29/2017  . H/O mechanical aortic valve  replacement 03/29/2017   Overview:  2011  . Hx of CABG 03/29/2017  . Hyperlipidemia 03/29/2017  . Hypertensive heart disease 03/29/2017  . Kidney stones 07/23/2015   Overview:  x 3  Last Assessment & Plan:  By [place] has left nephrolithiasis, but not visible by KUB. Will check CT renal colic next year to assess both stone burden as well as to surveil AML.   . Leukocytoclastic vasculitis ([place]) 10/01/2017  . Localized edema 01/04/2018  . Lumbar radicular pain 01/19/2016  . Maculopapular rash 09/03/2017  . Nephrolithiasis 07/23/2015   Overview:  x 3  Last Assessment & Plan:  By [place] has left nephrolithiasis, but not visible by KUB. Will check CT renal colic next year to assess both stone burden as well as to surveil AML.   Overview:  x 3  Last Assessment & Plan:  Has 80m nonobstructing LUP stone - not visible by KUB.  Will check renal [place] - he will contact office sooner if symptomatic.   . Non-sustained ventricular tachycardia ([place] 03/29/2017  . Other hyperlipidemia 03/29/2017  . Palpitations 10/01/2017  . [place]ox[place]mal atrial fibrillation ([place] 03/29/2017  . Paroxysmal atrial fibrillation ([place] 03/29/2017  . Pleural effusion, bilateral 08/09/2017  . Post-nasal drainage 02/25/2016   Last Assessment & Plan:  Trial zyrtec and flonase  . Prostate cancer screening 06/20/2017   Last Assessment & Plan:  Recommend continued annual CaP screening until within 10 years of life expectancy. Given good health and fhx of longevity, would anticipate CaP screening to continue until age 73  PSA today and again in one year on day of visit.  . Pulmonary hypertension ([place] 08/09/2017  . Pulmonary nodules 06/12/2016  . S/P AVR (aortic valve replacement) 03/20/2016  . Supratherapeutic INR 07/26/2017  . Syncope 03/29/2017  . Syncope and collapse 02/01/2016  . Typical atrial flutter ([place] 02/01/2016     Social History   Socioeconomic History  . Marital status: Single    Spouse name: Not on file  . Number of children: Not on file  . Years of  education: Not on file  . Highest education level: Not on file  Occupational History  . Not on file  Social Needs  . Financial resource strain: Not on file  . Food insecurity:    Worry: Not on file    Inability: Not on file  . Transportation needs:    Medical: Not on file    Non-medical: Not on file  Tobacco Use  . Smoking status: Former Smoker    Packs/day: 2.00    Years: 34.00    Pack years: 68.00    Types: Cigarettes    Last attempt to quit: 07/16/2000    Years since quitting: 18.5  . Smokeless tobacco: Never Used  Substance and Sexual Activity  . Alcohol use: Yes  . Drug use: No  . Sexual activity: Not on file  Lifestyle  . Physical activity:    Days per week: Not on file    Minutes per session: Not on file  . Stress: Not on file  Relationships  . Social connections:    Talks on phone: Not on  file    Gets together: Not on file    Attends religious service: Not on file    Active member of club or organization: Not on file    Attends meetings of clubs or organizations: Not on file    Relationship status: Not on file  . Intimate partner violence:    Fear of current or ex partner: Not on file    Emotionally abused: Not on file    Physically abused: Not on file    Forced sexual activity: Not on file  Other Topics Concern  . Not on file  Social History Narrative  . Not on file    Past Surgical History:  Procedure Laterality Date  . CHOLECYSTECTOMY    . CORONARY ARTERY BYPASS GRAFT    . EXPLORATORY LAPAROTOMY  07/30/2017  . FOOT SURGERY    . FRACTURE SURGERY Right    wrist and forearm  . HERNIA REPAIR    . MECHANICAL AORTIC VALVE REPLACEMENT    . NASAL SINUS SURGERY    . RIGHT HEART CATH N/A 01/18/2018   Procedure: RIGHT HEART CATH;  Surgeon: Jolaine Artist, MD;  Location: [place] [facility];  Service: Cardiovascular;  Laterality: N/A;  . RIGHT HEART CATH N/A 05/14/2018   Procedure: RIGHT HEART CATH;  Surgeon: Jolaine Artist, MD;  Location: [place] [facility];  Service: Cardiovascular;  Laterality: N/A;  . TEE WITHOUT CARDIOVERSION N/A 05/21/2018   Procedure: TRANSESOPHAGEAL ECHOCARDIOGRAM (TEE);  Surgeon: Jolaine Artist, MD;  Location: [facility];  Service: Cardiovascular;  Laterality: N/A;  . UPPER GASTROINTESTINAL ENDOSCOPY  07/12/2017   Patchy areas of mucosal inflammation noted in the antrum with edema,erthema and ulcerations. Bx. Chronicfocally active gastritis.  [name] VASECTOMY      Family History  Problem Relation Age of Onset  . Asthma Mother   . Arthritis Mother   . Heart attack Father   . Hypertension Father   . Stroke Paternal Grandmother   . Colon cancer Neg Hx     Allergies  Allergen Reactions  . Amoxicillin-Pot Clavulanate Diarrhea    Stomach pain Has patient had a PCN reaction causing immediate rash, facial/tongue/throat swelling, SOB or lightheadedness with hypotension: No Has patient had a PCN reaction causing severe rash involving mucus membranes or skin necrosis: No Has patient had a PCN reaction that required hospitalization: No Has patient had a PCN reaction occurring within the last 10 years: Yes If all of the above answers are "NO", then may proceed with Cephalosporin use.   . Tape Rash and Other (See Comments)    Surgical tape    Current Outpatient Medications on File Prior to Visit  Medication Sig Dispense Refill  . acetaminophen (TYLENOL) 650 MG CR tablet Take 1 tablet (650 mg total) by mouth every 8 (eight) hours as needed for pain. Not to use with norco since combined with tylenol. 30 tablet 0  . albuterol (PROVENTIL HFA;VENTOLIN HFA) 108 (90 Base) MCG/ACT inhaler Inhale 2 puffs into the lungs as needed for wheezing or shortness of breath.    [name] aspirin EC 81 MG tablet Take 81 mg by mouth daily.    . B Complex-C (B-COMPLEX WITH VITAMIN C) tablet Take 1 tablet by mouth daily.     . cetirizine (ZYRTEC ALLERGY) 10 MG tablet Take 10 mg by mouth daily as needed for allergies.     . Coenzyme  Q10 (COQ10 PO) Take 1 tablet by mouth daily.    . cyclobenzaprine (FLEXERIL) 5 MG  tablet Take 1 tablet (5 mg total) by mouth at bedtime. 10 tablet 0  . ENSURE (ENSURE) Take 237 mLs by mouth every other day.    [name] HYDROcodone-acetaminophen (NORCO) 5-325 MG tablet Take 1 tablet by mouth every 6 (six) hours as needed for moderate pain. 16 tablet 0  . macitentan (OPSUMIT) 10 MG tablet Take 1 tablet (10 mg total) by mouth daily. 30 tablet 0  . midodrine (PROAMATINE) 10 MG tablet Take 1 tablet (10 mg total) by mouth 3 (three) times daily with meals. 90 tablet 6  . multivitamin-lutein (OCUVITE-LUTEIN) CAPS capsule Take 1 capsule by mouth daily.    . pantoprazole (PROTONIX) 40 MG tablet Take 1 tablet (40 mg total) by mouth daily. 30 tablet 11  . rosuvastatin (CRESTOR) 5 MG tablet Take 1 tablet (5 mg total) by mouth daily. 30 tablet 6  . spironolactone (ALDACTONE) 25 MG tablet Take 0.5 tablets (12.5 mg total) by mouth daily. 45 tablet 3  . tadalafil, PAH, (ADCIRCA) 20 MG tablet TAKE 2 TABLETS BY MOUTH ONCE A DAY 60 tablet 12  . torsemide (DEMADEX) 20 MG tablet Take 20 mg by mouth 2 (two) times daily as needed (will take 3 times a day if needed).     . warfarin (COUMADIN) 5 MG tablet As directed by coumadin clinic 60 tablet 0  . potassium chloride (K-DUR) 10 MEQ tablet Take 2 tablets (20 mEq total) by mouth daily. 184 tablet 2   No current facility-administered medications on file prior to visit.     BP 124/68   Pulse (!) 54   Temp 98.2 F (36.8 C) (Oral)   Resp 16   Ht 5' 8.5" (1.74 m)   Wt 151 lb 3.2 oz (68.6 kg)   SpO2 99%   BMI 22.66 kg/m       Objective:   Physical Exam  General Mental Status- Alert. General Appearance- Not in acute distress.   Skin General: Color- Normal Color. Moisture- Normal Moisture.  Neck No mid cspine tenderness. Rt trapezius tenderness to palpation throughout.  Chest and Lung Exam Auscultation: Breath  Sounds:-Normal.  Cardiovascular Auscultation:Rythm- Regular. Murmurs & Other Heart Sounds:Auscultation of the heart reveals- No Murmurs.  Abdomen Inspection:-Inspeection Normal. Palpation/Percussion:Note:No mass. Palpation and Percussion of the abdomen reveal- Non Tender, Non Distended + BS, no rebound or guarding.    Neurologic Cranial Nerve exam:- CN III-XII intact(No nystagmus), symmetric smile. Strength:- 5/5 equal and symmetric strength both upper and lower extremities.      Assessment & Plan:  For neck pain and trapezius pain will refer you to sports medicine.  Recommend during interim recommend that you not read too much as neck is tilted forward. Maybe read more I bed reclined.   Can use tylenol otc and will rx zanaflex to use at night.  Can try [facility]acare heat patch.  Xray of cspine today.  Follow up in one month after sport med appointment or as needed.  [name], PA-C

## 2019-02-11 NOTE — Telephone Encounter (Signed)
Can you call 4 mg tablet.

## 2019-02-11 NOTE — Telephone Encounter (Signed)
Can give tabs in place of caps.

## 2019-02-11 NOTE — Patient Instructions (Addendum)
For neck pain and trapezius pain will refer you to sports medicine.  Recommend during interim recommend that you not read too much as neck is tilted forward. Maybe read more I bed reclined.   Can use tylenol otc and will rx zanaflex to use at night.  Can try thermacare heat patch.  Xray of cspine today.  Follow up in one month after sport med appointment or as needed.

## 2019-02-11 NOTE — Telephone Encounter (Signed)
Called pharmacy and changed to 4 mg tablets.

## 2019-02-11 NOTE — Telephone Encounter (Signed)
Copied from [place] [phone]. Topic: Quick Communication - Rx Refill/Question >> Feb 11, 2019 12:25 PM Alanda Slim E wrote: Medication:  tizanidine (ZANAFLEX) 6 MG capsule - Pharmacy called to see if they can get this Rx switched from the capsules to the tablets due to capsules never being covered/ please advise      Preferred Pharmacy (with phone number or street name): [facility] [place], [address] AT [address] [phone] (Phone) [phone] (Fax)    Agent: Please be advised that RX refills may take up to 3 business days. We ask that you follow-up with your pharmacy.

## 2019-02-11 NOTE — Telephone Encounter (Signed)
Pharmacy states tizanidine only comes in 71m  or 487min a "tablet". Please advise.

## 2019-02-14 ENCOUNTER — Other Ambulatory Visit (HOSPITAL_COMMUNITY): Payer: Self-pay

## 2019-02-14 MED ORDER — MIDODRINE HCL 10 MG PO TABS: 10.0000 mg | ORAL_TABLET | Freq: Three times a day (TID) | ORAL | 6 refills | Status: DC

## 2019-02-17 ENCOUNTER — Ambulatory Visit (INDEPENDENT_AMBULATORY_CARE_PROVIDER_SITE_OTHER): Payer: Medicare Other | Admitting: Family Medicine

## 2019-02-17 ENCOUNTER — Encounter: Payer: Self-pay | Admitting: Family Medicine

## 2019-02-17 VITALS — BP 131/79 | HR 81 | Ht 69.0 in | Wt 144.0 lb

## 2019-02-17 DIAGNOSIS — S46811A Strain of other muscles, fascia and tendons at shoulder and upper arm level, right arm, initial encounter: Secondary | ICD-10-CM | POA: Diagnosis not present

## 2019-02-17 DIAGNOSIS — I2510 Atherosclerotic heart disease of native coronary artery without angina pectoris: Secondary | ICD-10-CM | POA: Diagnosis not present

## 2019-02-17 NOTE — Progress Notes (Signed)
PCP: [name], PA-C  Subjective:   HPI: Patient is a 73 y.o. male here for trapezius/neck pain.  Pt reports chronic history of right sided neck pain for which he normally see's a chiropractor. About 6 weeks ago he had a fall and exacerbated this pain. He reports anywhere from 1-7/10 pain. He did see a new chiropractor in [place] and believes the manipulative treatments worsened his pain. Made worse with looking back over his right shoulder. Some benefit from heat. Flexeril not helpful. Tylenol at night has not seemed to be helpful. Using heat that helps temporarily. He feels that he is restricted in ROM with his neck. Denies numbness or tingling in BUE. No weakness in the BUE.  No bowel/bladder dysfunction.  Past Medical History:  Diagnosis Date  . 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 In light of concurrent left nephrolithiasis, will check CT renal colic next year instead of renal US.   [name] Anticoagulated on Coumadin 01/04/2018  . Anxiety 05/10/2016   Last Assessment & Plan:  Doing well off of zoloft.  . Asymptomatic microscopic hematuria 06/20/2017   Last Assessment & Plan:  Had hematuria workup in [facility] in 2016 which negative CT and cystoscopy. UA with 2+ blood last visit - we discussed recommendation for repeat workup at 5 years or if degree of hematuria progresses.   . Atrial fibrillation ([place]) 03/29/2017  . Atrial flutter ([place]) 03/29/2017  . Chronic allergic rhinitis 04/28/2016   Last Assessment & Plan:  Continue astelin  . Chronic anticoagulation 03/29/2017  . Chronic atrial fibrillation 07/23/2015   Last Assessment & Plan:  Coumadin and metoprolol, cardiology referral to establish care.  . Chronic midline back pain 12/14/2015   Last Assessment & Plan:  Pain management referral for further evaluation.  . Chronic prostatitis 07/23/2015   Last Assessment & Plan:  Has largely resolved since stopping bike riding. Recommend annual DRE  AND PSA - will see back 12/2015 for annual screening, given 1st degree fhx. To call office for recurrent prostatitis symptoms.   [name] COPD (chronic obstructive pulmonary disease) ([place]) 06/12/2016  . Coronary artery disease involving native coronary artery of native heart with angina pectoris ([place]) 03/29/2017  . Cough 10/17/2016   Last Assessment & Plan:  Discussed typical course for acute viral illness. If symptoms worsen or fail to improve by 7-10d, delayed ATBs, fluids, rest, NSAIDs/APAP prn. Seek care if not improving. Needs earlier INR check due to ATBs.  [name] Dyspnea 02/01/2016   Last Assessment & Plan:  Overall improving, eval by pulm, plan for CT, neg stress test with cardiology. Recent switch to carvedilol due to side effects.  . Epidermoid cyst of skin 08/24/2017  . Essential hypertension 12/14/2015   Last Assessment & Plan:  Hypertension control: controlled  Medications: compliant Medication Management: as noted in orders Home blood pressure monitoring recommended additionally as needed for symptoms  The patient's care plan was reviewed and updated. Instructions and counseling were provided regarding patient goals and barriers. He was counseled to adopt a healthy lifestyle. Educational resources and self-management tools have been provided as charted in [facility] list.   . H/O maze procedure 03/29/2017  . [name] 03/29/2017   Overview:  2011  . Hx of CABG 03/29/2017  . Hyperlipidemia 03/29/2017  . Hypertensive heart disease 03/29/2017  . Kidney stones 07/23/2015   Overview:  x 3  Last Assessment & Plan:  By [place] has left  nephrolithiasis, but not visible by KUB. Will check CT renal colic next year to assess both stone burden as well as to surveil AML.   . Leukocytoclastic vasculitis ([place]) 10/01/2017  . Localized edema 01/04/2018  . Lumbar radicular pain 01/19/2016  . Maculopapular rash 09/03/2017  . Nephrolithiasis 07/23/2015   Overview:  x 3  Last Assessment & Plan:  By [place] has left  nephrolithiasis, but not visible by KUB. Will check CT renal colic next year to assess both stone burden as well as to surveil AML.   Overview:  x 3  Last Assessment & Plan:  Has 73m nonobstructing LUP stone - not visible by KUB.  Will check renal [place] - he will contact office sooner if symptomatic.   . Non-sustained ventricular tachycardia ([place] 03/29/2017  . Other hyperlipidemia 03/29/2017  . Palpitations 10/01/2017  . Paroxysmal atrial fibrillation ([place] 03/29/2017  . Paroxysmal atrial fibrillation ([place] 03/29/2017  . Pleural effusion, bilateral 08/09/2017  . Post-nasal drainage 02/25/2016   Last Assessment & Plan:  Trial zyrtec and flonase  . Prostate cancer screening 06/20/2017   Last Assessment & Plan:  Recommend continued annual CaP screening until within 10 years of life expectancy. Given good health and fhx of longevity, would anticipate CaP screening to continue until age 73  PSA today and again in one year on day of visit.  . Pulmonary hypertension ([place] 08/09/2017  . Pulmonary nodules 06/12/2016  . S/P AVR (aortic valve replacement) 03/20/2016  . Supratherapeutic INR 07/26/2017  . Syncope 03/29/2017  . Syncope and collapse 02/01/2016  . Typical atrial flutter ([place] 02/01/2016    Current Outpatient Medications on File Prior to Visit  Medication Sig Dispense Refill  . acetaminophen (TYLENOL) 650 MG CR tablet Take 1 tablet (650 mg total) by mouth every 8 (eight) hours as needed for pain. Not to use with norco since combined with tylenol. 30 tablet 0  . albuterol (PROVENTIL HFA;VENTOLIN HFA) 108 (90 Base) MCG/ACT inhaler Inhale 2 puffs into the lungs as needed for wheezing or shortness of breath.    .Marland Kitchenaspirin EC 81 MG tablet Take 81 mg by mouth daily.    . B Complex-C (B-COMPLEX WITH VITAMIN C) tablet Take 1 tablet by mouth daily.     . cetirizine (ZYRTEC ALLERGY) 10 MG tablet Take 10 mg by mouth daily as needed for allergies.     . Coenzyme Q10 (COQ10 PO) Take 1 tablet by mouth daily.    .  cyclobenzaprine (FLEXERIL) 5 MG tablet Take 1 tablet (5 mg total) by mouth at bedtime. 10 tablet 0  . ENSURE (ENSURE) Take 237 mLs by mouth every other day.    .Marland KitchenHYDROcodone-acetaminophen (NORCO) 5-325 MG tablet Take 1 tablet by mouth every 6 (six) hours as needed for moderate pain. 16 tablet 0  . macitentan (OPSUMIT) 10 MG tablet Take 1 tablet (10 mg total) by mouth daily. 30 tablet 0  . midodrine (PROAMATINE) 10 MG tablet Take 1 tablet (10 mg total) by mouth 3 (three) times daily with meals. 90 tablet 6  . multivitamin-lutein (OCUVITE-LUTEIN) CAPS capsule Take 1 capsule by mouth daily.    . pantoprazole (PROTONIX) 40 MG tablet Take 1 tablet (40 mg total) by mouth daily. 30 tablet 11  . potassium chloride (K-DUR) 10 MEQ tablet Take 2 tablets (20 mEq total) by mouth daily. 184 tablet 2  . rosuvastatin (CRESTOR) 5 MG tablet Take 1 tablet (5 mg total) by mouth daily. 30 tablet 6  . spironolactone (ALDACTONE)  25 MG tablet Take 0.5 tablets (12.5 mg total) by mouth daily. 45 tablet 3  . tadalafil, PAH, (ADCIRCA) 20 MG tablet TAKE 2 TABLETS BY MOUTH ONCE A DAY 60 tablet 12  . tizanidine (ZANAFLEX) 6 MG capsule 1 tab po q hs as needed muscle spasms 7 capsule 0  . torsemide (DEMADEX) 20 MG tablet Take 20 mg by mouth 2 (two) times daily as needed (will take 3 times a day if needed).     . warfarin (COUMADIN) 5 MG tablet As directed by coumadin clinic 60 tablet 0   No current facility-administered medications on file prior to visit.     Past Surgical History:  Procedure Laterality Date  . CHOLECYSTECTOMY    . CORONARY ARTERY BYPASS GRAFT    . EXPLORATORY LAPAROTOMY  07/30/2017  . FOOT SURGERY    . FRACTURE SURGERY Right    wrist and forearm  . HERNIA REPAIR    . MECHANICAL AORTIC VALVE REPLACEMENT    . NASAL SINUS SURGERY    . RIGHT HEART CATH N/A 01/18/2018   Procedure: RIGHT HEART CATH;  Surgeon: Jolaine Artist, MD;  Location: [facility];  Service: Cardiovascular;  Laterality: N/A;   . RIGHT HEART CATH N/A 05/14/2018   Procedure: RIGHT HEART CATH;  Surgeon: Jolaine Artist, MD;  Location: [facility];  Service: Cardiovascular;  Laterality: N/A;  . TEE WITHOUT CARDIOVERSION N/A 05/21/2018   Procedure: TRANSESOPHAGEAL ECHOCARDIOGRAM (TEE);  Surgeon: Jolaine Artist, MD;  Location[facility];  Service: Cardiovascular;  Laterality: N/A;  . UPPER GASTROINTESTINAL ENDOSCOPY  07/12/2017   Patchy areas of mucosal inflammation noted in the antrum with edema,erthema and ulcerations. Bx. Chronicfocally active gastritis.  [name] VASECTOMY      Allergies  Allergen Reactions  . Amoxicillin-Pot Clavulanate Diarrhea    Stomach pain Has patient had a PCN reaction causing immediate rash, facial/tongue/throat swelling, SOB or lightheadedness with hypotension: No Has patient had a PCN reaction causing severe rash involving mucus membranes or skin necrosis: No Has patient had a PCN reaction that required hospitalization: No Has patient had a PCN reaction occurring within the last 10 years: Yes If all of the above answers are "NO", then may proceed with Cephalosporin use.   . Tape Rash and Other (See Comments)    Surgical tape    Social History   Socioeconomic History  . Marital status: Single    Spouse name: Not on file  . Number of children: Not on file  . Years of education: Not on file  . Highest education level: Not on file  Occupational History  . Not on file  Social Needs  . Financial resource strain: Not on file  . Food insecurity:    Worry: Not on file    Inability: Not on file  . Transportation needs:    Medical: Not on file    Non-medical: Not on file  Tobacco Use  . Smoking status: Former Smoker    Packs/day: 2.00    Years: 34.00    Pack years: 68.00    Types: Cigarettes    Last attempt to quit: 07/16/2000    Years since quitting: 18.6  . Smokeless tobacco: Never Used  Substance and Sexual Activity  . Alcohol use: Yes  . Drug use: No  .  Sexual activity: Not on file  Lifestyle  . Physical activity:    Days per week: Not on file    Minutes per session: Not on file  .  Stress: Not on file  Relationships  . Social connections:    Talks on phone: Not on file    Gets together: Not on file    Attends religious service: Not on file    Active member of club or organization: Not on file    Attends meetings of clubs or organizations: Not on file    Relationship status: Not on file  . Intimate partner violence:    Fear of current or ex partner: Not on file    Emotionally abused: Not on file    Physically abused: Not on file    Forced sexual activity: Not on file  Other Topics Concern  . Not on file  Social History Narrative  . Not on file    Family History  Problem Relation Age of Onset  . Asthma Mother   . Arthritis Mother   . Heart attack Father   . Hypertension Father   . Stroke Paternal Grandmother   . Colon cancer Neg Hx     There were no vitals taken for this visit.  Review of Systems: See HPI above.     Objective:  Physical Exam:  Gen: awake, alert, NAD, comfortable in exam room Pulm: breathing unlabored  C-spine: No deformity or swelling Palpable active trigger point in right supper trapezius. No midline TTP Decrease ROM with rotation to the right 5/5 strength in BUE NV intact in BUE   Bilateral shoulders: No swelling, ecchymoses.  No gross deformity. No TTP. FROM. Strength 5/5 with empty can and resisted internal/external rotation. NV intact distally.  Assessment & Plan:  1. Trapezius spasm - no concern for cervical spine injury or nerve involvement - referral to PT for modality treatment - dry needling, soft tissue, etc - continue heat - f/u 6 weeks - Can consider trigger point injection if not improving.

## 2019-02-17 NOTE — Patient Instructions (Signed)
You have a trapezius muscle spasm. Start physical therapy and do home exercises on days you don't go to therapy. Heat 15 minutes at a time 3-4 times a day at least. Salon pas patches, aspercreme topically can help with pain and inflammation. Tylenol as you have been. Consider trigger point injection if not improving. Follow up in 6 weeks for reevaluation.

## 2019-02-18 ENCOUNTER — Encounter: Payer: Self-pay | Admitting: Family Medicine

## 2019-02-25 ENCOUNTER — Ambulatory Visit (INDEPENDENT_AMBULATORY_CARE_PROVIDER_SITE_OTHER): Payer: Medicare Other | Admitting: Pharmacist

## 2019-02-25 ENCOUNTER — Other Ambulatory Visit: Payer: Self-pay

## 2019-02-25 ENCOUNTER — Encounter: Payer: Self-pay | Admitting: Physical Therapy

## 2019-02-25 ENCOUNTER — Ambulatory Visit: Payer: Medicare Other | Attending: Family Medicine | Admitting: Physical Therapy

## 2019-02-25 DIAGNOSIS — M6281 Muscle weakness (generalized): Secondary | ICD-10-CM | POA: Insufficient documentation

## 2019-02-25 DIAGNOSIS — R293 Abnormal posture: Secondary | ICD-10-CM | POA: Insufficient documentation

## 2019-02-25 DIAGNOSIS — I4892 Unspecified atrial flutter: Secondary | ICD-10-CM

## 2019-02-25 DIAGNOSIS — Z7901 Long term (current) use of anticoagulants: Secondary | ICD-10-CM

## 2019-02-25 DIAGNOSIS — R29898 Other symptoms and signs involving the musculoskeletal system: Secondary | ICD-10-CM

## 2019-02-25 DIAGNOSIS — Z5181 Encounter for therapeutic drug level monitoring: Secondary | ICD-10-CM

## 2019-02-25 DIAGNOSIS — M542 Cervicalgia: Secondary | ICD-10-CM

## 2019-02-25 LAB — POCT INR: INR: 1.3 — AB (ref 2.0–3.0)

## 2019-02-25 NOTE — Therapy (Signed)
[facility] [address]  [place], [place], [address] Phone: [phone]   Fax:  [phone]  Physical Therapy Evaluation  Patient Details  Name: Lance Hicks MRN: [id number] Date of Birth: 23-Dec-1946 Referring Provider (PT): Karlton Lemon, MD   Encounter Date: 02/25/2019  PT End of Session - 02/25/19 1151    Visit Number  1    Number of Visits  7    Date for PT Re-Evaluation  04/08/19    Authorization Type  Medicare & AARP    PT Start Time  1108    PT Stop Time  1148    PT Time Calculation (min)  40 min    Activity Tolerance  Patient tolerated treatment well;Patient limited by pain    Behavior During Therapy  [facility] for tasks assessed/performed       Past Medical History:  Diagnosis Date  . 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 In light of concurrent left nephrolithiasis, will check CT renal colic next year instead of renal US.   [name] Anticoagulated on Coumadin 01/04/2018  . Anxiety 05/10/2016   Last Assessment & Plan:  Doing well off of zoloft.  . Asymptomatic microscopic hematuria 06/20/2017   Last Assessment & Plan:  Had hematuria workup in [facility] in 2016 which negative CT and cystoscopy. UA with 2+ blood last visit - we discussed recommendation for repeat workup at 5 years or if degree of hematuria progresses.   . Atrial fibrillation ([place]) 03/29/2017  . Atrial flutter ([place]) 03/29/2017  . Chronic allergic rhinitis 04/28/2016   Last Assessment & Plan:  Continue astelin  . Chronic anticoagulation 03/29/2017  . Chronic atrial fibrillation 07/23/2015   Last Assessment & Plan:  Coumadin and metoprolol, cardiology referral to establish care.  . Chronic midline back pain 12/14/2015   Last Assessment & Plan:  Pain management referral for further evaluation.  . Chronic prostatitis 07/23/2015   Last Assessment & Plan:  Has largely resolved since stopping bike riding.  Recommend annual DRE AND PSA - will see back 12/2015 for annual screening, given 1st degree fhx. To call office for recurrent prostatitis symptoms.   [name] COPD (chronic obstructive pulmonary disease) ([place]) 06/12/2016  . Coronary artery disease involving native coronary artery of native heart with angina pectoris ([place]) 03/29/2017  . Cough 10/17/2016   Last Assessment & Plan:  Discussed typical course for acute viral illness. If symptoms worsen or fail to improve by 7-10d, delayed ATBs, fluids, rest, NSAIDs/APAP prn. Seek care if not improving. Needs earlier INR check due to ATBs.  [name] Dyspnea 02/01/2016   Last Assessment & Plan:  Overall improving, eval by pulm, plan for CT, neg stress test with cardiology. Recent switch to carvedilol due to side effects.  . Epidermoid cyst of skin 08/24/2017  . Essential hypertension 12/14/2015   Last Assessment & Plan:  Hypertension control: controlled  Medications: compliant Medication Management: as noted in orders Home blood pressure monitoring recommended additionally as needed for symptoms  The patient's care plan was reviewed and updated. Instructions and counseling were provided regarding patient goals and barriers. He was counseled to adopt a healthy lifestyle. Educational resources and self-management tools have been provided as charted in [facility] list.   . H/O maze procedure 03/29/2017  . [name] 03/29/2017   Overview:  2011  . Hx of CABG 03/29/2017  . Hyperlipidemia 03/29/2017  . Hypertensive  heart disease 03/29/2017  . Kidney stones 07/23/2015   Overview:  x 3  Last Assessment & Plan:  By [place] has left nephrolithiasis, but not visible by KUB. Will check CT renal colic next year to assess both stone burden as well as to surveil AML.   . Leukocytoclastic vasculitis ([place]) 10/01/2017  . Localized edema 01/04/2018  . Lumbar radicular pain 01/19/2016  . Maculopapular rash 09/03/2017  . Nephrolithiasis 07/23/2015   Overview:  x 3  Last Assessment & Plan:   By [place] has left nephrolithiasis, but not visible by KUB. Will check CT renal colic next year to assess both stone burden as well as to surveil AML.   Overview:  x 3  Last Assessment & Plan:  Has 67m nonobstructing LUP stone - not visible by KUB.  Will check renal [place] - he will contact office sooner if symptomatic.   . Non-sustained ventricular tachycardia ([place] 03/29/2017  . Other hyperlipidemia 03/29/2017  . Palpitations 10/01/2017  . Paroxysmal atrial fibrillation ([place] 03/29/2017  . Paroxysmal atrial fibrillation ([place] 03/29/2017  . Pleural effusion, bilateral 08/09/2017  . Post-nasal drainage 02/25/2016   Last Assessment & Plan:  Trial zyrtec and flonase  . Prostate cancer screening 06/20/2017   Last Assessment & Plan:  Recommend continued annual CaP screening until within 10 years of life expectancy. Given good health and fhx of longevity, would anticipate CaP screening to continue until age 72  PSA today and again in one year on day of visit.  . Pulmonary hypertension ([place] 08/09/2017  . Pulmonary nodules 06/12/2016  . S/P AVR (aortic valve replacement) 03/20/2016  . Supratherapeutic INR 07/26/2017  . Syncope 03/29/2017  . Syncope and collapse 02/01/2016  . Typical atrial flutter ([place] 02/01/2016    Past Surgical History:  Procedure Laterality Date  . CHOLECYSTECTOMY    . CORONARY ARTERY BYPASS GRAFT    . EXPLORATORY LAPAROTOMY  07/30/2017  . FOOT SURGERY    . FRACTURE SURGERY Right    wrist and forearm  . HERNIA REPAIR    . MECHANICAL AORTIC VALVE REPLACEMENT    . NASAL SINUS SURGERY    . RIGHT HEART CATH N/A 01/18/2018   Procedure: RIGHT HEART CATH;  Surgeon: BJolaine Artist MD;  [facility];  Service: Cardiovascular;  Laterality: N/A;  . RIGHT HEART CATH N/A 05/14/2018   Procedure: RIGHT HEART CATH;  Surgeon: BJolaine Artist MD;  [facility];  Service: Cardiovascular;  Laterality: N/A;  . TEE WITHOUT CARDIOVERSION N/A 05/21/2018   Procedure:  TRANSESOPHAGEAL ECHOCARDIOGRAM (TEE);  Surgeon: BJolaine Artist MD;  [facility];  Service: Cardiovascular;  Laterality: N/A;  . UPPER GASTROINTESTINAL ENDOSCOPY  07/12/2017   Patchy areas of mucosal inflammation noted in the antrum with edema,erthema and ulcerations. Bx. Chronicfocally active gastritis.  .Marland KitchenVASECTOMY      There were no vitals filed for this visit.   Subjective Assessment - 02/25/19 1110    Subjective  Patient reports chronic neck pain for several years, but with increase in stiffness in the past 2 months. Reports that he went to a chiropractor for this pain, but that has made it worse. Pain is located over R UT. Worse with R & L rotation, extension. Feels that he has to turn his entire body to check his blind spot when driving.  Notes that R RTC pain has improved but still has intermittent pain in R posterior shoulder. Better with heat. Denies N/T.  Pertinent History   a fib, a flutter, chronic anticoagulation, chronic back pain, COPD, CAD, dyspnea, HTN, aortic valve replacement 2018, CABG 2018, HLD, kidney stones, leukocytoclastic vasculitis, pleural effusion, syncope, wrist and forearm fx surgery    Limitations  House hold activities;Lifting    Diagnostic tests  02/11/19 cervical xray: Multilevel degenerative disc disease and cervical spondylosis, as above. No acute findings.    Patient Stated Goals  "be able to turn my neck without any kind of pain"    Currently in Pain?  Yes    Pain Score  0-No pain    Pain Location  Neck    Pain Orientation  Right;Posterior    Pain Descriptors / Indicators  Dull    Pain Type  Acute pain         [facility] PT Assessment - 02/25/19 1115      Assessment   Medical Diagnosis  R Trapezius Strain    Referring Provider (PT)  Karlton Lemon, MD    Onset Date/Surgical Date  12/27/18    Hand Dominance  Right    Next MD Visit  03/28/19    Prior Therapy  Yes- R shoulder      Precautions   Precautions  --   a-fib, a-flutter,  chronic anticoagulation     Restrictions   Weight Bearing Restrictions  No      Balance Screen   Has the patient fallen in the past 6 months  Yes    How many times?  1   tripped on a throw rug and broke multiple ribs   Has the patient had a decrease in activity level because of a fear of falling?   No    Is the patient reluctant to leave their home because of a fear of falling?   No      Home Film/video editor residence      Prior Function   Level of Independence  Independent    Vocation  Retired    Leisure  Field seismologist   Overall Cognitive Status  Within Functional Limits for tasks assessed      Observation/Other Assessments   Observations  pt with bruises throughout B arms d/t chronic anticoagulation    Focus on Therapeutic Outcomes (FOTO)   neck: 65 (35% limited, 29% predicted)      Sensation   Light Touch  Appears Intact      Coordination   Gross Motor Movements are Fluid and Coordinated  Yes      Posture/Postural Control   Posture/Postural Control  Postural limitations    Postural Limitations  Rounded Shoulders;Forward head    Posture Comments  severe forward head      ROM / Strength   AROM / PROM / Strength  AROM;Strength      AROM   AROM Assessment Site  Cervical    Cervical Flexion  40    Cervical Extension  45   1/10 pain   Cervical - Right Side Bend  25   mild pain    Cervical - Left Side Bend  30   mild pain   Cervical - Right Rotation  mildly limited    Cervical - Left Rotation  moderately limited   moderate pain in R UT     Strength   Strength Assessment Site  Shoulder    Right/Left Shoulder  Right;Left    Right Shoulder Flexion  4+/5    Right Shoulder ABduction  4+/5    Right Shoulder Internal Rotation  4+/5    Right Shoulder External Rotation  4/5    Left Shoulder Flexion  4+/5    Left Shoulder ABduction  4+/5    Left Shoulder Internal Rotation  4+/5    Left Shoulder External Rotation  4+/5       Palpation   Spinal mobility  no TTP over cervical spinous processes    Palpation comment  TTP and soft tissue restriction in R UT, scalene, rhomboid, pec, proximal biceps tendon; very TTP in infraspinatus/teres group                Objective measurements completed on examination: See above findings.              PT Education - 02/25/19 1151    Education Details  prognosis, POC, HEP; advised to avoid pushing into pain with HEP    Person(s) Educated  Patient    Methods  Explanation;Demonstration;Tactile cues;Verbal cues;Handout    Comprehension  Verbalized understanding;Returned demonstration       PT Short Term Goals - 02/25/19 1200      PT SHORT TERM GOAL #1   Title  Patient to be independent with inital HEP.    Time  3    Period  Weeks    Status  New    Target Date  03/18/19        PT Long Term Goals - 02/25/19 1200      PT LONG TERM GOAL #1   Title  Patient to be independent with advanced HEP.    Time  6    Period  Weeks    Status  New    Target Date  04/08/19      PT LONG TERM GOAL #2   Title  Patient to demonstrate cervical AROM WFL without pain limiting.     Time  6    Period  Weeks    Status  New    Target Date  04/08/19      PT LONG TERM GOAL #3   Title  Patient to demonstrate >=4+/5 R UE strength.    Time  6    Period  Weeks    Status  New    Target Date  04/08/19      PT LONG TERM GOAL #4   Title  Patient to report 80% improvement in cervical pain.    Time  6    Period  Weeks    Status  New    Target Date  04/08/19      PT LONG TERM GOAL #5   Title  Patient to report tolerance of checking R & L blind spots while driving with no pain.     Time  6    Period  Weeks    Status  New    Target Date  04/08/19             Plan - 02/25/19 1152    Clinical Impression Statement  Patient is a 73y/o M presenting to OPPT with c/o R sided neck pain of 2 months duration. Pain is located over R UT. Worse with R & L rotation,  extension. Feels that he has to turn his entire body to check his blind spot when driving. Denies N/T. Also notes intermittent R posterior shoulder pain from old RTC tear. Patient today with limited and painful cervical ROM- worst with extension and R rotation, decreased R shoulder strength in IR and ER,  abnormal posture, and TTP and soft tissue restriction in R UT, scalene, rhomboid, pec, proximal biceps tendon; very TTP in infraspinatus/teres group. Patient educated on gentle stretching and ROM HEP and advised not tot push into pain. Patient reported understanding. Would benefit from skilled PT services 1x/week for 6 weeks to address aforementioned impairments.     Personal Factors and Comorbidities  Age;Past/Current Experience;Comorbidity 3+    Comorbidities   a fib, a flutter, chronic anticoagulation, chronic back pain, COPD, CAD, dyspnea, HTN, aortic valve replacement 2018, CABG 2018, HLD, kidney stones, leukocytoclastic vasculitis, pleural effusion, syncope, wrist and forearm fx surgery    Examination-Activity Limitations  Lift;Bathing;Reach Overhead;Carry    Examination-Participation Restrictions  Driving;Other;Laundry;Yard Work;Cleaning   reading   Stability/Clinical Decision Making  Stable/Uncomplicated    Clinical Decision Making  Low    Rehab Potential  Good    PT Frequency  1x / week    PT Duration  6 weeks    PT Treatment/Interventions  ADLs/Self Care Home Management;Cryotherapy;Electrical Stimulation;Functional mobility training;Ultrasound;Moist Heat;Therapeutic activities;Therapeutic exercise;Neuromuscular re-education;Patient/family education;Passive range of motion;Manual techniques;Energy conservation;Splinting;Taping;Vasopneumatic Device    PT Next Visit Plan  reassess HEP    Consulted and Agree with Plan of Care  Patient       Patient will benefit from skilled therapeutic intervention in order to improve the following deficits and impairments:  Hypomobility, Decreased activity  tolerance, Decreased strength, Increased fascial restricitons, Impaired UE functional use, Pain, Increased muscle spasms, Decreased range of motion, Improper body mechanics, Impaired flexibility, Postural dysfunction  Visit Diagnosis: Cervicalgia  Other symptoms and signs involving the musculoskeletal system  Abnormal posture  Muscle weakness (generalized)     Problem List Patient Active Problem List   Diagnosis Date Noted  . Encounter for therapeutic drug monitoring 01/06/2019  . Hypotension, chronic 06/06/2018  . Enterococcal bacteremia   . Pulmonary edema   . Shoulder pain   . PAH (pulmonary artery hypertension) ([place]) 02/20/2018  . Hypokalemia 02/18/2018  . Anticoagulated on Coumadin 01/04/2018  . Localized edema 01/04/2018  . Leukocytoclastic vasculitis ([place]) 10/01/2017  . Palpitations 10/01/2017  . Maculopapular rash 09/03/2017  . Epidermoid cyst of skin 08/24/2017  . Pleural effusion, bilateral 08/09/2017  . Pulmonary hypertension ([place]) 08/09/2017  . Supratherapeutic INR 07/26/2017  . Asymptomatic microscopic hematuria 06/20/2017  . Prostate cancer screening 06/20/2017  . Atrial flutter ([place]) 03/29/2017  . Chronic anticoagulation 03/29/2017  . Coronary artery disease involving native coronary artery of native heart with angina pectoris ([place]) 03/29/2017  . H/O maze procedure 03/29/2017  . [name] 03/29/2017  . Hx of CABG 03/29/2017  . Hypertensive heart disease with heart failure ([place]) 03/29/2017  . Non-sustained ventricular tachycardia ([place]) 03/29/2017  . Hyperlipidemia 03/29/2017  . Syncope 03/29/2017  . Cough 10/17/2016  . COPD (chronic obstructive pulmonary disease) ([place]) 06/12/2016  . Pulmonary nodules 06/12/2016  . Anxiety 05/10/2016  . 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 Chronic allergic rhinitis 04/28/2016  . History of prosthetic aortic valve 03/20/2016  . Post-nasal drainage 02/25/2016  . Dyspnea 02/01/2016   . Syncope and collapse 02/01/2016  . Typical atrial flutter ([place]) 02/01/2016  . Lumbar radicular pain 01/19/2016  . Chronic midline back pain 12/14/2015  . Essential hypertension 12/14/2015  . Chronic prostatitis 07/23/2015  . Nephrolithiasis 07/23/2015     Janene Harvey, PT, DPT 02/25/19 12:03 PM   [facility] [place], [place], [address] Phone: [phone]   Fax:  [phone]  Name: ICHAEL PULLARA MRN: [id number] Date of Birth: April 11, 1946

## 2019-02-25 NOTE — Patient Instructions (Signed)
Description    Take 1.5 tablets today and 1 tablet tomorrow then change dose to taking 5 mg daily except for 2.5 mg on Mondays, Wednesdays and Fridays. Please call our office with any medication changes or concerns [phone]. Return in 2 weeks for INR check.

## 2019-03-03 ENCOUNTER — Ambulatory Visit: Payer: Medicare Other

## 2019-03-03 DIAGNOSIS — M6281 Muscle weakness (generalized): Secondary | ICD-10-CM

## 2019-03-03 DIAGNOSIS — M542 Cervicalgia: Secondary | ICD-10-CM | POA: Diagnosis not present

## 2019-03-03 DIAGNOSIS — R293 Abnormal posture: Secondary | ICD-10-CM

## 2019-03-03 DIAGNOSIS — R29898 Other symptoms and signs involving the musculoskeletal system: Secondary | ICD-10-CM

## 2019-03-03 NOTE — Therapy (Addendum)
[facility] [address]  [place], [place], [address] Phone: [phone]   Fax:  [phone]  Physical Therapy Treatment  Patient Details  Name: Lance Hicks MRN: [id number] Date of Birth: March 09, 1946 Referring Provider (PT): Karlton Lemon, MD   Progress Note Reporting Period 02/25/19 to 03/03/19  See note below for Objective Data and Assessment of Progress/Goals.     Encounter Date: 03/03/2019  PT End of Session - 03/03/19 1455    Visit Number  2    Number of Visits  7    Date for PT Re-Evaluation  04/08/19    Authorization Type  Medicare & AARP    PT Start Time  4970    PT Stop Time  1530    PT Time Calculation (min)  38 min    Activity Tolerance  Patient tolerated treatment well;Patient limited by pain    Behavior During Therapy  [facility] for tasks assessed/performed       Past Medical History:  Diagnosis Date  . 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 In light of concurrent left nephrolithiasis, will check CT renal colic next year instead of renal US.   [name] Anticoagulated on Coumadin 01/04/2018  . Anxiety 05/10/2016   Last Assessment & Plan:  Doing well off of zoloft.  . Asymptomatic microscopic hematuria 06/20/2017   Last Assessment & Plan:  Had hematuria workup in [facility] in 2016 which negative CT and cystoscopy. UA with 2+ blood last visit - we discussed recommendation for repeat workup at 5 years or if degree of hematuria progresses.   . Atrial fibrillation ([place]) 03/29/2017  . Atrial flutter ([place]) 03/29/2017  . Chronic allergic rhinitis 04/28/2016   Last Assessment & Plan:  Continue astelin  . Chronic anticoagulation 03/29/2017  . Chronic atrial fibrillation 07/23/2015   Last Assessment & Plan:  Coumadin and metoprolol, cardiology referral to establish care.  . Chronic midline back pain 12/14/2015   Last Assessment & Plan:  Pain management referral for  further evaluation.  . Chronic prostatitis 07/23/2015   Last Assessment & Plan:  Has largely resolved since stopping bike riding. Recommend annual DRE AND PSA - will see back 12/2015 for annual screening, given 1st degree fhx. To call office for recurrent prostatitis symptoms.   [name] COPD (chronic obstructive pulmonary disease) ([place]) 06/12/2016  . Coronary artery disease involving native coronary artery of native heart with angina pectoris ([place]) 03/29/2017  . Cough 10/17/2016   Last Assessment & Plan:  Discussed typical course for acute viral illness. If symptoms worsen or fail to improve by 7-10d, delayed ATBs, fluids, rest, NSAIDs/APAP prn. Seek care if not improving. Needs earlier INR check due to ATBs.  [name] Dyspnea 02/01/2016   Last Assessment & Plan:  Overall improving, eval by pulm, plan for CT, neg stress test with cardiology. Recent switch to carvedilol due to side effects.  . Epidermoid cyst of skin 08/24/2017  . Essential hypertension 12/14/2015   Last Assessment & Plan:  Hypertension control: controlled  Medications: compliant Medication Management: as noted in orders Home blood pressure monitoring recommended additionally as needed for symptoms  The patient's care plan was reviewed and updated. Instructions and counseling were provided regarding patient goals and barriers. He was counseled to adopt a healthy lifestyle. Educational resources and self-management tools have been provided as charted in [facility] list.   . H/O maze procedure 03/29/2017  . H/O mechanical  aortic valve replacement 03/29/2017   Overview:  2011  . Hx of CABG 03/29/2017  . Hyperlipidemia 03/29/2017  . Hypertensive heart disease 03/29/2017  . Kidney stones 07/23/2015   Overview:  x 3  Last Assessment & Plan:  By [place] has left nephrolithiasis, but not visible by KUB. Will check CT renal colic next year to assess both stone burden as well as to surveil AML.   . Leukocytoclastic vasculitis ([place]) 10/01/2017  . Localized edema 01/04/2018  . Lumbar  radicular pain 01/19/2016  . Maculopapular rash 09/03/2017  . Nephrolithiasis 07/23/2015   Overview:  x 3  Last Assessment & Plan:  By [place] has left nephrolithiasis, but not visible by KUB. Will check CT renal colic next year to assess both stone burden as well as to surveil AML.   Overview:  x 3  Last Assessment & Plan:  Has 18m nonobstructing LUP stone - not visible by KUB.  Will check renal [place] - he will contact office sooner if symptomatic.   . Non-sustained ventricular tachycardia ([place] 03/29/2017  . Other hyperlipidemia 03/29/2017  . Palpitations 10/01/2017  . Paroxysmal atrial fibrillation ([place] 03/29/2017  . Paroxysmal atrial fibrillation ([place] 03/29/2017  . Pleural effusion, bilateral 08/09/2017  . Post-nasal drainage 02/25/2016   Last Assessment & Plan:  Trial zyrtec and flonase  . Prostate cancer screening 06/20/2017   Last Assessment & Plan:  Recommend continued annual CaP screening until within 10 years of life expectancy. Given good health and fhx of longevity, would anticipate CaP screening to continue until age 72  PSA today and again in one year on day of visit.  . Pulmonary hypertension ([place] 08/09/2017  . Pulmonary nodules 06/12/2016  . S/P AVR (aortic valve replacement) 03/20/2016  . Supratherapeutic INR 07/26/2017  . Syncope 03/29/2017  . Syncope and collapse 02/01/2016  . Typical atrial flutter ([place] 02/01/2016    Past Surgical History:  Procedure Laterality Date  . CHOLECYSTECTOMY    . CORONARY ARTERY BYPASS GRAFT    . EXPLORATORY LAPAROTOMY  07/30/2017  . FOOT SURGERY    . FRACTURE SURGERY Right    wrist and forearm  . HERNIA REPAIR    . MECHANICAL AORTIC VALVE REPLACEMENT    . NASAL SINUS SURGERY    . RIGHT HEART CATH N/A 01/18/2018   Procedure: RIGHT HEART CATH;  Surgeon: BJolaine Artist MD;  [facility];  Service: Cardiovascular;  Laterality: N/A;  . RIGHT HEART CATH N/A 05/14/2018   Procedure: RIGHT HEART CATH;  Surgeon: BJolaine Artist MD;   [facility];  Service: Cardiovascular;  Laterality: N/A;  . TEE WITHOUT CARDIOVERSION N/A 05/21/2018   Procedure: TRANSESOPHAGEAL ECHOCARDIOGRAM (TEE);  Surgeon: BJolaine Artist MD;  [facility];  Service: Cardiovascular;  Laterality: N/A;  . UPPER GASTROINTESTINAL ENDOSCOPY  07/12/2017   Patchy areas of mucosal inflammation noted in the antrum with edema,erthema and ulcerations. Bx. Chronicfocally active gastritis.  .Marland KitchenVASECTOMY      There were no vitals filed for this visit.  Subjective Assessment - 03/03/19 1500    Subjective  Pt. noting he is having difficulty with towel stretch.      Pertinent History   a fib, a flutter, chronic anticoagulation, chronic back pain, COPD, CAD, dyspnea, HTN, aortic valve replacement 2018, CABG 2018, HLD, kidney stones, leukocytoclastic vasculitis, pleural effusion, syncope, wrist and forearm fx surgery    Diagnostic tests  02/11/19 cervical xray: Multilevel degenerative disc disease and cervical spondylosis, as  above. No acute findings.    Patient Stated Goals  "be able to turn my neck without any kind of pain"    Currently in Pain?  No/denies    Pain Score  0-No pain    Pain Location  Neck    Pain Orientation  Right    Pain Descriptors / Indicators  Dull    Pain Type  Acute pain    Multiple Pain Sites  No                       [facility] Adult PT Treatment/Exercise - 03/03/19 1506      Neck Exercises: Machines for Strengthening   UBE (Upper Arm Bike)  Lvl 1.5, 3 min forwards/3 min backwards    Cybex Row  low row 5# x 15 reps  - focusing on avoiding excessive scapular elevation    Other Machines for Strengthening  partial wall pushup x 10 reps    reviewed as alternative to gym bench press     Neck Exercises: Seated   Neck Retraction  10 reps;5 secs      Neck Exercises: Supine   Neck Retraction  15 reps;5 secs    Neck Retraction Limitations  chin tuck into pillow       Manual Therapy   Manual Therapy   Passive ROM    Manual therapy comments  supine     Passive ROM  Cervical PROM all directions + B LS, UT, SCM stretch x 30 sec manually with therapist       Neck Exercises: Stretches   Upper Trapezius Stretch  Right;Left;1 rep;30 seconds    Upper Trapezius Stretch Limitations  opposite UE anchored     Levator Stretch  Right;Left;1 rep;30 seconds    Levator Stretch Limitations  opposite UE anchored    Chest Stretch  1 rep;30 seconds    Chest Stretch Limitations  low, mid pec stretch in doorway              PT Education - 03/03/19 1753    Education Details  HEP update; red band issued to pt.     Person(s) Educated  Patient    Methods  Explanation;Demonstration;Verbal cues;Handout    Comprehension  Verbalized understanding;Returned demonstration;Verbal cues required;Need further instruction       PT Short Term Goals - 03/03/19 1456      PT SHORT TERM GOAL #1   Title  Patient to be independent with inital HEP.    Time  3    Period  Weeks    Status  On-going    Target Date  03/18/19        PT Long Term Goals - 03/03/19 1456      PT LONG TERM GOAL #1   Title  Patient to be independent with advanced HEP.    Time  6    Period  Weeks    Status  On-going      PT LONG TERM GOAL #2   Title  Patient to demonstrate cervical AROM WFL without pain limiting.     Time  6    Period  Weeks    Status  On-going      PT LONG TERM GOAL #3   Title  Patient to demonstrate >=4+/5 R UE strength.    Time  6    Period  Weeks    Status  On-going      PT LONG TERM GOAL #4   Title  Patient  to report 80% improvement in cervical pain.    Time  6    Period  Weeks    Status  On-going      PT LONG TERM GOAL #5   Title  Patient to report tolerance of checking R & L blind spots while driving with no pain.     Time  6    Period  Weeks    Status  On-going            Plan - 03/03/19 1456    Clinical Impression Statement  Pt. noting he wishes to review cervical rotation towel  stretch however feels he tolerated HEP well otherwise.  Required min cueing with review of HEP cervical rotation towel stretch however demonstrated all other HEP activities well overall.  MT focusing on cervical PROM and gentle upper shoulder/cervical stretches manually with therapist with pt. noting improved mobility following this.  Pt. did request alternative pec strengthening activity as he was previously performing bench press thus demo'd partial wall pushup with Legrand Como tolerating well.  Ended visit with pt. pain free.      Personal Factors and Comorbidities  Age;Past/Current Experience;Comorbidity 3+    Comorbidities   a fib, a flutter, chronic anticoagulation, chronic back pain, COPD, CAD, dyspnea, HTN, aortic valve replacement 2018, CABG 2018, HLD, kidney stones, leukocytoclastic vasculitis, pleural effusion, syncope, wrist and forearm fx surgery    Examination-Activity Limitations  Lift;Bathing;Reach Overhead;Carry    Examination-Participation Restrictions  Driving;Other;Laundry;Yard Work;Cleaning    Stability/Clinical Decision Making  Stable/Uncomplicated    Rehab Potential  Good    PT Treatment/Interventions  ADLs/Self Care Home Management;Cryotherapy;Electrical Stimulation;Functional mobility training;Ultrasound;Moist Heat;Therapeutic activities;Therapeutic exercise;Neuromuscular re-education;Patient/family education;Passive range of motion;Manual techniques;Energy conservation;Splinting;Taping;Vasopneumatic Device    Consulted and Agree with Plan of Care  Patient       Patient will benefit from skilled therapeutic intervention in order to improve the following deficits and impairments:  Hypomobility, Decreased activity tolerance, Decreased strength, Increased fascial restricitons, Impaired UE functional use, Pain, Increased muscle spasms, Decreased range of motion, Improper body mechanics, Impaired flexibility, Postural dysfunction  Visit Diagnosis: Cervicalgia  Other symptoms and  signs involving the musculoskeletal system  Abnormal posture  Muscle weakness (generalized)     Problem List Patient Active Problem List   Diagnosis Date Noted  . Encounter for therapeutic drug monitoring 01/06/2019  . Hypotension, chronic 06/06/2018  . Enterococcal bacteremia   . Pulmonary edema   . Shoulder pain   . PAH (pulmonary artery hypertension) ([place]) 02/20/2018  . Hypokalemia 02/18/2018  . Anticoagulated on Coumadin 01/04/2018  . Localized edema 01/04/2018  . Leukocytoclastic vasculitis ([place]) 10/01/2017  . Palpitations 10/01/2017  . Maculopapular rash 09/03/2017  . Epidermoid cyst of skin 08/24/2017  . Pleural effusion, bilateral 08/09/2017  . Pulmonary hypertension ([place]) 08/09/2017  . Supratherapeutic INR 07/26/2017  . Asymptomatic microscopic hematuria 06/20/2017  . Prostate cancer screening 06/20/2017  . Atrial flutter ([place]) 03/29/2017  . Chronic anticoagulation 03/29/2017  . Coronary artery disease involving native coronary artery of native heart with angina pectoris ([place]) 03/29/2017  . H/O maze procedure 03/29/2017  . [name] 03/29/2017  . Hx of CABG 03/29/2017  . Hypertensive heart disease with heart failure ([place]) 03/29/2017  . Non-sustained ventricular tachycardia ([place]) 03/29/2017  . Hyperlipidemia 03/29/2017  . Syncope 03/29/2017  . Cough 10/17/2016  . COPD (chronic obstructive pulmonary disease) ([place]) 06/12/2016  . Pulmonary nodules 06/12/2016  . Anxiety 05/10/2016  . 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 Chronic allergic rhinitis  04/28/2016  . History of prosthetic aortic valve 03/20/2016  . Post-nasal drainage 02/25/2016  . Dyspnea 02/01/2016  . Syncope and collapse 02/01/2016  . Typical atrial flutter ([place]) 02/01/2016  . Lumbar radicular pain 01/19/2016  . Chronic midline back pain 12/14/2015  . Essential hypertension 12/14/2015  . Chronic prostatitis 07/23/2015  . Nephrolithiasis 07/23/2015    Bess Harvest, PTA 03/03/19 6:10 PM   [facility] [address]  [place], [place], [address] Phone: [phone]   Fax:  [phone]  Name: AUTHER LYERLY MRN: [id number] Date of Birth: 1946-02-10  PHYSICAL THERAPY DISCHARGE SUMMARY  Visits from Start of Care: 2  Current functional level related to goals / functional outcomes: Unable to assess; patient did not return d/t concern about COVID-19   Remaining deficits: Unable to assess   Education / Equipment: HEP  Plan: Patient agrees to discharge.  Patient goals were not met. Patient is being discharged due to not returning since the last visit.  ?????     Janene Harvey, PT, DPT 04/08/19 12:56 PM

## 2019-03-06 ENCOUNTER — Encounter (HOSPITAL_COMMUNITY): Payer: Medicare Other | Admitting: Internal Medicine

## 2019-03-10 ENCOUNTER — Other Ambulatory Visit: Payer: Self-pay

## 2019-03-10 ENCOUNTER — Other Ambulatory Visit (HOSPITAL_COMMUNITY): Payer: Self-pay | Admitting: Internal Medicine

## 2019-03-10 ENCOUNTER — Inpatient Hospital Stay: Admission: RE | Admit: 2019-03-10 | Payer: Medicare Other | Source: Ambulatory Visit

## 2019-03-10 ENCOUNTER — Ambulatory Visit (INDEPENDENT_AMBULATORY_CARE_PROVIDER_SITE_OTHER)
Admission: RE | Admit: 2019-03-10 | Discharge: 2019-03-10 | Disposition: A | Discharge status: Home / Self Care | Payer: Medicare Other | Source: Ambulatory Visit | Attending: Emergency Medicine | Admitting: Emergency Medicine

## 2019-03-10 DIAGNOSIS — R918 Other nonspecific abnormal finding of lung field: Secondary | ICD-10-CM

## 2019-03-11 ENCOUNTER — Ambulatory Visit (INDEPENDENT_AMBULATORY_CARE_PROVIDER_SITE_OTHER): Payer: Medicare Other | Admitting: *Deleted

## 2019-03-11 DIAGNOSIS — Z5181 Encounter for therapeutic drug level monitoring: Secondary | ICD-10-CM | POA: Diagnosis not present

## 2019-03-11 DIAGNOSIS — I4892 Unspecified atrial flutter: Secondary | ICD-10-CM | POA: Diagnosis not present

## 2019-03-11 DIAGNOSIS — Z7901 Long term (current) use of anticoagulants: Secondary | ICD-10-CM

## 2019-03-11 LAB — POCT INR: INR: 3.8 — AB (ref 2.0–3.0)

## 2019-03-11 MED ORDER — WARFARIN SODIUM 5 MG PO TABS: ORAL_TABLET | ORAL | 3 refills | Status: DC

## 2019-03-11 NOTE — Patient Instructions (Signed)
Description   Skip today's dose, then change your dose 2.10m daily except 578mon Sundays, Tuesdays and Thursdays.  Please call our office with any medication changes or concerns [phone]. Return in 2 weeks for INR check.

## 2019-03-12 ENCOUNTER — Other Ambulatory Visit (HOSPITAL_COMMUNITY): Payer: Self-pay

## 2019-03-12 IMAGING — MR MR CARD MORPHOLOGY WO/W CM
11 of 12 series · 38 of 40 positions shown · IV contrast (multihance)
Comparison: none

CLINICAL DATA: ?Constrictive pericarditis

EXAM:
CARDIAC MRI
TECHNIQUE: The patient was scanned on a 1.5 Tesla GE magnet. A dedicated
cardiac coil was used. Functional imaging was done using Fiesta
sequences. [DATE], and 4 chamber views were done to assess for RWMA's.
Modified Jirouch rule using a short axis stack was used to
calculate an ejection fraction on a dedicated work station using
Circle software. The patient received 20 cc of Multihance. After 10
minutes inversion recovery sequences were used to assess for
infiltration and scar tissue.
CONTRAST:  20 cc Multihance contrast

[Series 3: bSSFP · sagittal · 8.0mm · 1.37mm/px · 1 of 18 slices shown (1 of 5)]
[im 1/18]
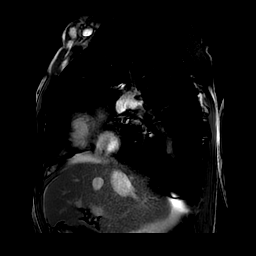

[Series 4: bSSFP · oblique · 8.0mm · 1.41mm/px · 1 of 20 slices shown (2 of 5)]
[im 1/20]
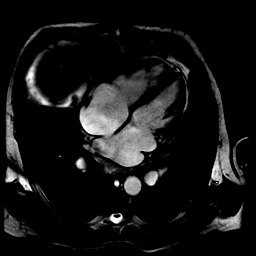

[Series 5: bSSFP · coronal · 6.0mm · 1.37mm/px · 1 of 20 slices shown (3 of 5)]
[im 1/20]
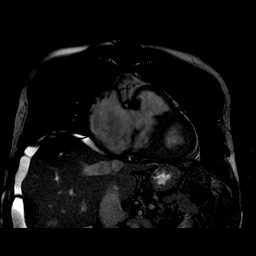

[Series 6: bSSFP · oblique · 8.0mm · 1.41mm/px · 15 of 300 slices shown (4 of 5)]
[im 1/300]
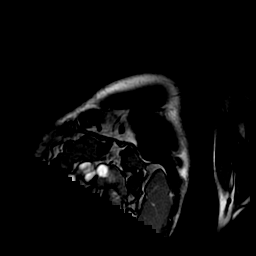
[im 22/300]
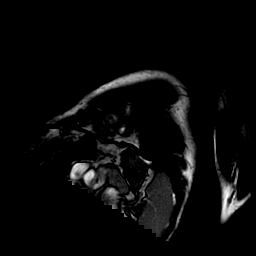
[im 43/300]
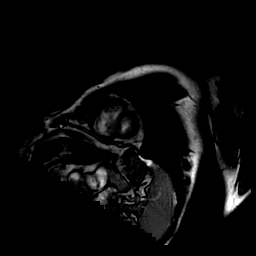
[im 65/300]
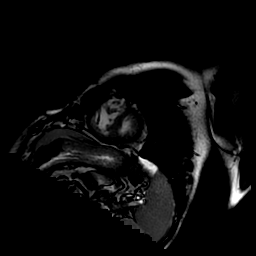
[im 86/300]
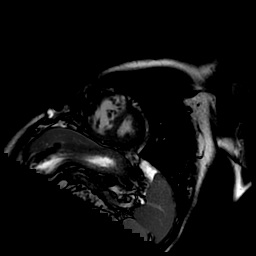
[im 107/300]
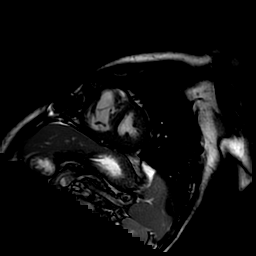
[im 129/300]
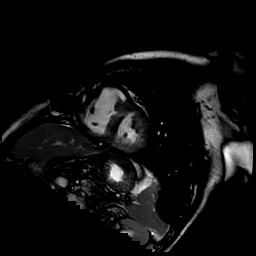
[im 150/300]
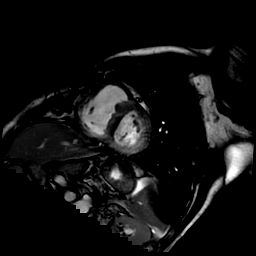
[im 171/300]
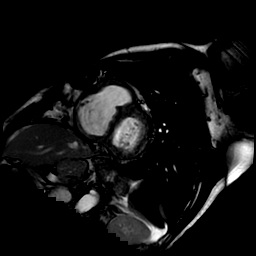
[im 193/300]
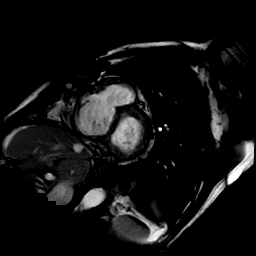
[im 214/300]
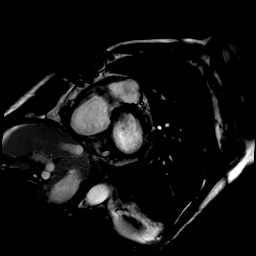
[im 235/300]
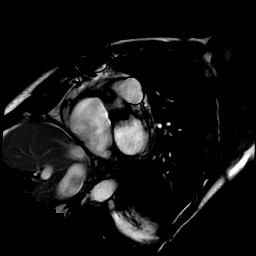
[im 257/300]
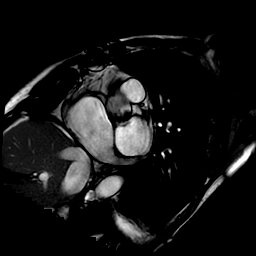
[im 278/300]
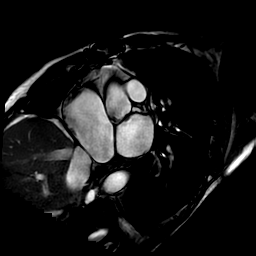
[im 300/300]
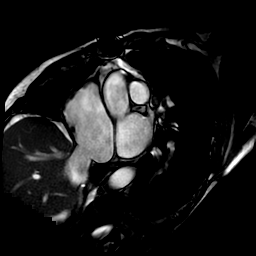

[Series 7: real time hla · oblique · 8.0mm · 1.45mm/px · 2 of 47 slices shown]
[im 1/47]
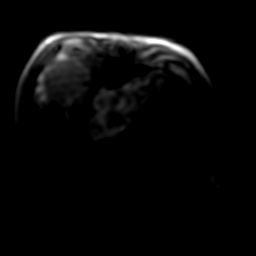
[im 47/47]
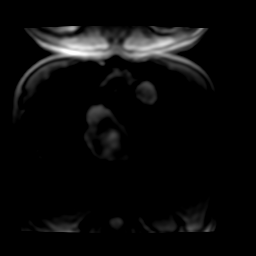

[Series 9: real time vla · axial · 8.0mm · 1.60mm/px · z∈[+103,+248]mm · 4 of 70 slices shown]
[im 1/70]
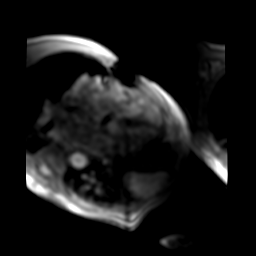
[im 24/70]
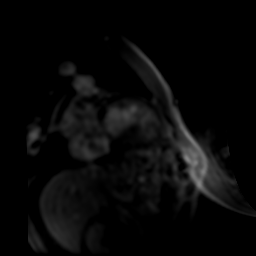
[im 47/70]
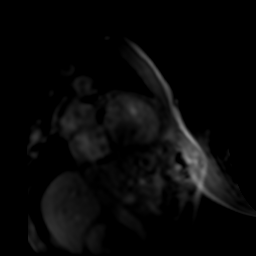
[im 70/70]
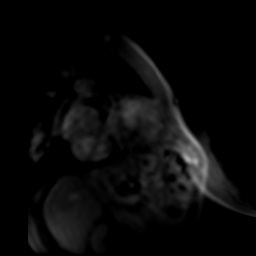

[Series 10: real time sa · oblique · 8.0mm · 1.60mm/px · 4 of 68 slices shown]
[im 1/68]
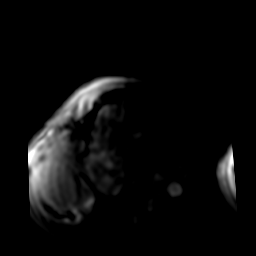
[im 23/68]
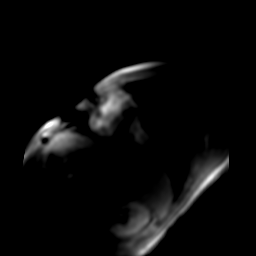
[im 45/68]
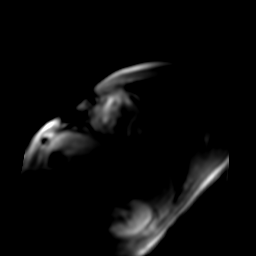
[im 68/68]
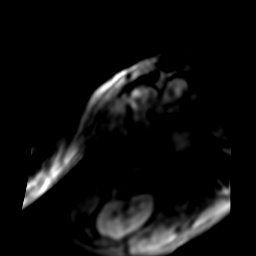

[Series 11: bSSFP · axial · 8.0mm · 1.41mm/px · z∈[-144,+224]mm · 3 of 60 slices shown (5 of 5)]
[im 1/60]
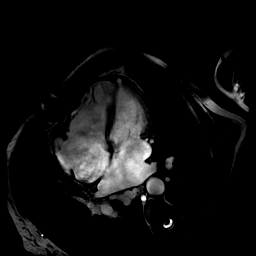
[im 30/60]
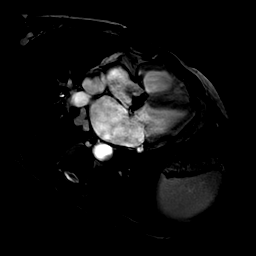
[im 60/60]
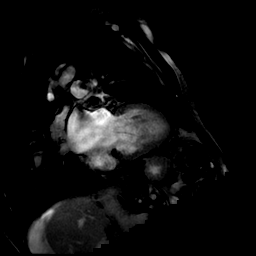

[Series 14: delayed ir prep · oblique · 8.0mm · 1.48mm/px · 1 of 10 slices shown]
[im 1/10]
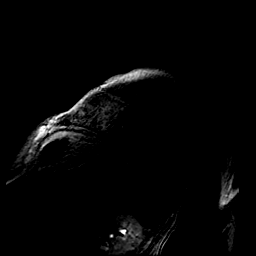

[Series 15: rad mde · axial · 8.0mm · 1.41mm/px · 1 of 3 slices shown]
[im 1/3]
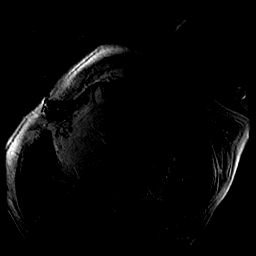

[Series 17: mr (person_name) · oblique · 8.0mm · 2.81mm/px · 5 of 99 slices shown]
[im 1/99]
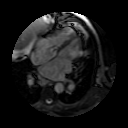
[im 25/99]
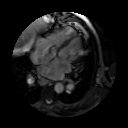
[im 50/99]
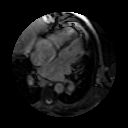
[im 74/99]
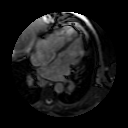
[im 99/99]
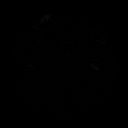

[38 of 40 positions shown; findings below may reference images not displayed]

FINDINGS: Limited images of the lung fields show no gross abnormalities. S/p
sternotomy.

Normal left ventricular size and wall thickness. LV EF 48% with mild
diffuse hypokinesis. The right ventricle is moderately dilated with
severely decreased systolic function, EF 26%. D-shaped
interventricular septum suggesting RV pressure/volume overload.
There did not appear to be significant respirophasic variation of
the interventricular septum position. Moderate to severe biatrial
enlargement. The interatrial septum is shifted left, suggesting
elevated right atrial pressure. There was a mechanical aortic valve.
Moderate tricuspid regurgitation. Trivial mitral regurgitation.

Delayed enhancement images were difficult, but there did not appear
to be any late gadolinium enhancement (LGE).

Measurements:

LVEDV 128 mL

LVSV 62 mL

LVEF 48%

RVEDV 250 mL

RVSV 66 mL

RVEF 26%
IMPRESSION: 1.  Normal LV size with mild diffuse hypokinesis, EF 48%.

2. Moderately dilated RV with severely decreased systolic function,
EF 26%. D-shaped interventricular septum suggests RV pressure/volume
overload.

3.  Mechanical aortic valve.

4.  Moderate to severe biatrial enlargement.

5. Difficult images, but probably no LGE. Therefore, no definitive
evidence for prior MI, infiltrative disease, or myocarditis.

6. No significant respirophasic variation of the interventricular
septum. The septum is stably left-shifted.

No definite evidence for pericardial constriction from this study.

Davis Jim

## 2019-03-12 MED ORDER — TORSEMIDE 20 MG PO TABS: 20.0000 mg | ORAL_TABLET | Freq: Two times a day (BID) | ORAL | 3 refills | Status: DC | PRN

## 2019-03-14 ENCOUNTER — Ambulatory Visit: Payer: Medicare Other

## 2019-03-17 ENCOUNTER — Encounter: Payer: Medicare Other | Admitting: Physical Therapy

## 2019-03-17 ENCOUNTER — Telehealth (HOSPITAL_COMMUNITY): Payer: Self-pay | Admitting: *Deleted

## 2019-03-17 MED ORDER — SELEXIPAG 200 MCG PO TABS: 200.0000 ug | ORAL_TABLET | Freq: Two times a day (BID) | ORAL | Status: DC

## 2019-03-17 NOTE — Telephone Encounter (Signed)
[facility] called to verify if we had advised pt to restart Uptravi at 200 mcg BID, they state pt has been off med for a few weeks and told them he was supposed to restart at 200  Mcg  Per last OV pt did not tolerate high dose and was instructed to restart at 200 mcg BID.    [facility] aware and will send starter pack to pt.

## 2019-03-18 ENCOUNTER — Telehealth: Payer: Self-pay | Admitting: Emergency Medicine

## 2019-03-18 DIAGNOSIS — R918 Other nonspecific abnormal finding of lung field: Secondary | ICD-10-CM

## 2019-03-18 NOTE — Telephone Encounter (Signed)
Notes recorded by Collene Gobble, MD on 03/17/2019 at 5:49 PM EDT Please let the patient know that his pulmonary nodules are stable on CT scan of the chest. He needs a final repeat scan in September 2021.   ________________________  Lance Hicks and spoke with patient, he is aware and verbalized understanding. Order placed.

## 2019-03-24 ENCOUNTER — Telehealth: Payer: Self-pay

## 2019-03-24 ENCOUNTER — Encounter: Payer: Medicare Other | Admitting: Physical Therapy

## 2019-03-24 NOTE — Telephone Encounter (Signed)

## 2019-03-25 ENCOUNTER — Other Ambulatory Visit: Payer: Self-pay

## 2019-03-25 ENCOUNTER — Ambulatory Visit (INDEPENDENT_AMBULATORY_CARE_PROVIDER_SITE_OTHER): Payer: Medicare Other | Admitting: *Deleted

## 2019-03-25 ENCOUNTER — Telehealth: Payer: Self-pay | Admitting: Cardiology

## 2019-03-25 DIAGNOSIS — Z7901 Long term (current) use of anticoagulants: Secondary | ICD-10-CM

## 2019-03-25 DIAGNOSIS — I4892 Unspecified atrial flutter: Secondary | ICD-10-CM | POA: Diagnosis not present

## 2019-03-25 DIAGNOSIS — Z5181 Encounter for therapeutic drug level monitoring: Secondary | ICD-10-CM

## 2019-03-25 LAB — POCT INR: INR: 2.2 (ref 2.0–3.0)

## 2019-03-25 NOTE — Telephone Encounter (Signed)
Cardiac Questionnaire:    Since your last visit or hospitalization:    1. Have you been having new or worsening chest pain? no   2. Have you been having new or worsening shortness of breath? no 3. Have you been having new or worsening leg swelling, wt gain, or increase in abdominal girth (pants fitting more tightly)? no   4. Have you had any passing out spells? no    *A YES to any of these questions would result in the appointment being kept. *If all the answers to these questions are NO, we should indicate that given the current situation regarding the worldwide coronarvirus pandemic, at the recommendation of the CDC, we are looking to limit gatherings in our waiting area, and thus will reschedule their appointment beyond four weeks from today.   _____________   COVID-19 Pre-Screening Questions:   Do you currently have a fever? No  Have you recently travelled on a cruise, internationally, or to [place], [place], [place], [place], [place], or [place], [place]) ? no  Have you been in contact with someone that is currently pending confirmation of Covid19 testing or has been confirmed to have the [place] virus?  No  Are you currently experiencing fatigue or cough? No  YOUR CARDIOLOGY TEAM HAS ARRANGED FOR AN E-VISIT FOR YOUR APPOINTMENT - PLEASE REVIEW IMPORTANT INFORMATION BELOW SEVERAL DAYS PRIOR TO YOUR APPOINTMENT  Due to the recent COVID-19 pandemic, we are transitioning in-person office visits to tele-medicine visits in an effort to decrease unnecessary exposure to our patients and staff. Medicare and most insurances are covering these visits without a copay needed. We also encourage you to sign up for MyChart if you have not already done so. You will need a smartphone if possible. For patients that do not have this, we can still complete the visit using a regular telephone but do prefer a smartphone to enable video when possible. You may have a close family member that lives with you that can  help. If possible, we also ask that you have a blood pressure cuff and scale at home to measure your blood pressure, heart rate and weight prior to your scheduled appointment. Patients with clinical needs that need an in-person evaluation and testing will still be able to come to the office if absolutely necessary. If you have any questions, feel free to call our office.    IF YOU HAVE A SMARTPHONE, PLEASE DOWNLOAD THE WEBEX APP TO YOUR SMARTPHONE  - If Apple, go to [employer] and type in WebEx in the search bar. [place] [employer], the blue/green circle. The app is free but as with any other app download, your phone may require you to verify saved payment information or Apple password. You do NOT have to create a WebEx account.  - If Android, go to [employer] and type in [employer] in the search bar. [place] [employer], the blue/green circle. The app is free but as with any other app download, your phone may require you to verify saved payment information or Android password. You do NOT have to create a WebEx account.  It is very helpful to have this downloaded before your visit.    2-3 DAYS BEFORE YOUR APPOINTMENT  You will receive a telephone call from one of our [place] team members - your caller ID may say "Unknown caller." If this is a video visit, we will confirm that you have been able to download the WebEx app. We will remind  you check your blood pressure, heart rate and weight prior to your scheduled appointment. If you have an Apple Watch or Kardia, please upload any pertinent ECG strips the day before or morning of your appointment to [place]. Our staff will also make sure you have reviewed the consent and agree to move forward with your scheduled tele-health visit.     THE DAY OF YOUR APPOINTMENT  Approximately 15 minutes prior to your scheduled appointment, you will receive a telephone call from one of [facility] team - your caller ID may say "Unknown  caller."  Our staff will confirm medications, vital signs for the day and any symptoms you may be experiencing. Please have this information available prior to the time of visit start. It may also be helpful for you to have a pad of paper and pen handy for any instructions given during your visit. They will also walk you through joining the WebEx smartphone meeting if this is a video visit.    CONSENT FOR TELE-HEALTH VISIT - PLEASE RVIEW  I hereby voluntarily request, consent and authorize [facility] and its employed or contracted physicians, physician assistants, nurse practitioners or other licensed health care professionals (the Practitioner), to provide me with telemedicine health care services (the Services") as deemed necessary by the treating Practitioner. I acknowledge and consent to receive the Services by the Practitioner via telemedicine. I understand that the telemedicine visit will involve communicating with the Practitioner through live audiovisual communication technology and the disclosure of certain medical information by electronic transmission. I acknowledge that I have been given the opportunity to request an in-person assessment or other available alternative prior to the telemedicine visit and am voluntarily participating in the telemedicine visit.  I understand that I have the right to withhold or withdraw my consent to the use of telemedicine in the course of my care at any time, without affecting my right to future care or treatment, and that the Practitioner or I may terminate the telemedicine visit at any time. I understand that I have the right to inspect all information obtained and/or recorded in the course of the telemedicine visit and may receive copies of available information for a reasonable fee.  I understand that some of the potential risks of receiving the Services via telemedicine include:   Delay or interruption in medical evaluation due to technological  equipment failure or disruption;  Information transmitted may not be sufficient (e.g. poor resolution of images) to allow for appropriate medical decision making by the Practitioner; and/or   In rare instances, security protocols could fail, causing a breach of personal health information.  Furthermore, I acknowledge that it is my responsibility to provide information about my medical history, conditions and care that is complete and accurate to the best of my ability. I acknowledge that Practitioner's advice, recommendations, and/or decision may be based on factors not within their control, such as incomplete or inaccurate data provided by me or distortions of diagnostic images or specimens that may result from electronic transmissions. I understand that the practice of medicine is not an exact science and that Practitioner makes no warranties or guarantees regarding treatment outcomes. I acknowledge that I will receive a copy of this consent concurrently upon execution via email to the email address I last provided but may also request a printed copy by calling the office of [place].    I understand that my insurance will be billed for this visit.   I have read or had this consent read  to me.  I understand the contents of this consent, which adequately explains the benefits and risks of the Services being provided via telemedicine.   I have been provided ample opportunity to ask questions regarding this consent and the Services and have had my questions answered to my satisfaction.  I give my informed consent for the services to be provided through the use of telemedicine in my medical care  By participating in this telemedicine visit I agree to the above.  Patient gives verbal consent for televisit

## 2019-03-27 ENCOUNTER — Telehealth (INDEPENDENT_AMBULATORY_CARE_PROVIDER_SITE_OTHER): Payer: Medicare Other | Admitting: Cardiology

## 2019-03-27 ENCOUNTER — Encounter: Payer: Self-pay | Admitting: Cardiology

## 2019-03-27 ENCOUNTER — Telehealth: Payer: Self-pay | Admitting: Gastroenterology

## 2019-03-27 VITALS — BP 120/69 | HR 54 | Ht 69.0 in | Wt 146.0 lb

## 2019-03-27 DIAGNOSIS — I272 Pulmonary hypertension, unspecified: Secondary | ICD-10-CM

## 2019-03-27 DIAGNOSIS — I9589 Other hypotension: Secondary | ICD-10-CM

## 2019-03-27 DIAGNOSIS — E785 Hyperlipidemia, unspecified: Secondary | ICD-10-CM

## 2019-03-27 DIAGNOSIS — I110 Hypertensive heart disease with heart failure: Secondary | ICD-10-CM

## 2019-03-27 DIAGNOSIS — Z952 Presence of prosthetic heart valve: Secondary | ICD-10-CM

## 2019-03-27 DIAGNOSIS — N189 Chronic kidney disease, unspecified: Secondary | ICD-10-CM

## 2019-03-27 DIAGNOSIS — I25119 Atherosclerotic heart disease of native coronary artery with unspecified angina pectoris: Secondary | ICD-10-CM

## 2019-03-27 DIAGNOSIS — I4892 Unspecified atrial flutter: Secondary | ICD-10-CM

## 2019-03-27 DIAGNOSIS — Z7901 Long term (current) use of anticoagulants: Secondary | ICD-10-CM

## 2019-03-27 NOTE — Telephone Encounter (Signed)
Spoke with the patient who report infrequent painless red blood 2 different days with the bowel movement. Denies any rectal symptoms. No abdominal pain. States he "eat a bunch of peanuts. Do you think that would do it?" Also "rode a bicycle with an English seat if that makes any difference." Denies any hemorrhoid issue that he knows of.  He states he knows he is due colon cancer screening. He would like to use Cologard for his screening. Please advise. Thanks

## 2019-03-27 NOTE — Addendum Note (Signed)
Addended by: Austin Miles on: 03/27/2019 11:29 AM   Modules accepted: Orders

## 2019-03-27 NOTE — Patient Instructions (Addendum)
Medication Instructions:  Your physician recommends that you continue on your current medications as directed. Please refer to the Current Medication list given to you today.  If you need a refill on your cardiac medications before your next appointment, please call your pharmacy.   Lab work: None  If you have labs (blood work) drawn today and your tests are completely normal, you will receive your results only by: [name] MyChart Message (if you have MyChart) OR . A paper copy in the mail If you have any lab test that is abnormal or we need to change your treatment, we will call you to review the results.  Testing/Procedures: None  Follow-Up: At [facility], you and your health needs are our priority.  As part of our continuing mission to provide you with exceptional heart care, we have created designated Provider Care Teams.  These Care Teams include your primary Cardiologist (physician) and Advanced Practice Providers (APPs -  Physician Assistants and Nurse Practitioners) who all work together to provide you with the care you need, when you need it. You will need a follow up appointment in 3 months: Tuesday, 07/01/2019 at 10:40 am in the [place] office.

## 2019-03-27 NOTE — Progress Notes (Signed)
Virtual Visit via Telephone Note    Evaluation Performed:  Follow-up visit  This visit type was conducted due to national recommendations for restrictions regarding the COVID-19 Pandemic (e.g. social distancing).  This format is felt to be most appropriate for this patient at this time.  All issues noted in this document were discussed and addressed.  No physical exam was performed (except for noted visual exam findings with Video Visits).  Please refer to the patient's chart (MyChart message for video visits and phone note for telephone visits) for the patient's consent to telehealth for [facility].  Date:  03/27/2019   ID:  Lance Hicks, Lance Hicks 07/15/1946, MRN [id number]  Patient Location:  Home  Provider location:   [place]  PCP:  Lance Pai, PA-C  Cardiologist:  No primary care provider on file.  Dr. Bettina Gavia Electrophysiologist:  None   Chief Complaint: Follow-up for heart failure aortic valve anticoagulation hypotension  History of Present Illness:    Lance Hicks is a 73 y.o. male who presents via audio/video conferencing for a telehealth visit today.  This is a routine scheduled visit.  He is also followed in our warfarin clinic INR 03/25/1999 22.2.  The patient does not have symptoms concerning for COVID-19 infection (fever, chills, cough, or new shortness of breath).   Lance Hicks is a 73 y.o. male with a hx of permanent atrial fibrillation coronary artery disease aortic stenosis CABG aortic valve replacement #25 carbo metrics valve 2010 Maze procedure and left atrial appendage excision and pulmonary artery hypertension.  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  He uses a mask in public.  He has had no fever cough shortness of breath or sputum  production.  His blood pressures remain greater than 270 systolic he is not lightheaded.  He is able to exercise on the treadmill and presently does not have shortness of breath from his heart disease or pulmonary artery hypertension.  Intermittently he has increased edema and takes an extra dose of diuretic but his weights are stable he has had no chest pain syncope or bleeding complication of his warfarin.  He is also followed in the heart failure clinic for his pulmonary artery hypertension Prior CV studies:   The following studies were reviewed today:  Most recent labs 0 01/01/2019 creatinine 1.15 GFR 67 cc/min hemoglobin 13.6 last cholesterol January 2019 was 125 HDL 45 LDL 71.  He did have a recent bout of diverticulitis was seen by GI  Past Medical History:  Diagnosis Date  . 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 In light of concurrent left nephrolithiasis, will check CT renal colic next year instead of renal US.   [name] Anticoagulated on Coumadin 01/04/2018  . Anxiety 05/10/2016   Last Assessment & Plan:  Doing well off of zoloft.  . Asymptomatic microscopic hematuria 06/20/2017   Last Assessment & Plan:  Had hematuria workup in [facility] in 2016 which negative CT and cystoscopy. UA with 2+ blood last visit - we discussed recommendation for repeat workup at 5 years or if degree of hematuria progresses.   . Atrial fibrillation ([place]) 03/29/2017  . Atrial flutter ([place]) 03/29/2017  . Chronic allergic rhinitis 04/28/2016   Last Assessment & Plan:  Continue  astelin  . Chronic anticoagulation 03/29/2017  . Chronic atrial fibrillation 07/23/2015   Last Assessment & Plan:  Coumadin and metoprolol, cardiology referral to establish care.  . Chronic midline back pain 12/14/2015   Last Assessment & Plan:  Pain management referral for further evaluation.  . Chronic prostatitis 07/23/2015   Last Assessment & Plan:  Has largely resolved since stopping bike  riding. Recommend annual DRE AND PSA - will see back 12/2015 for annual screening, given 1st degree fhx. To call office for recurrent prostatitis symptoms.   [name] COPD (chronic obstructive pulmonary disease) ([place]) 06/12/2016  . Coronary artery disease involving native coronary artery of native heart with angina pectoris ([place]) 03/29/2017  . Cough 10/17/2016   Last Assessment & Plan:  Discussed typical course for acute viral illness. If symptoms worsen or fail to improve by 7-10d, delayed ATBs, fluids, rest, NSAIDs/APAP prn. Seek care if not improving. Needs earlier INR check due to ATBs.  [name] Dyspnea 02/01/2016   Last Assessment & Plan:  Overall improving, eval by pulm, plan for CT, neg stress test with cardiology. Recent switch to carvedilol due to side effects.  . Epidermoid cyst of skin 08/24/2017  . Essential hypertension 12/14/2015   Last Assessment & Plan:  Hypertension control: controlled  Medications: compliant Medication Management: as noted in orders Home blood pressure monitoring recommended additionally as needed for symptoms  The patient's care plan was reviewed and updated. Instructions and counseling were provided regarding patient goals and barriers. He was counseled to adopt a healthy lifestyle. Educational resources and self-management tools have been provided as charted in [facility] list.   . H/O maze procedure 03/29/2017  . [name] 03/29/2017   Overview:  2011  . Hx of CABG 03/29/2017  . Hyperlipidemia 03/29/2017  . Hypertensive heart disease 03/29/2017  . Kidney stones 07/23/2015   Overview:  x 3  Last Assessment & Plan:  By [place] has left nephrolithiasis, but not visible by KUB. Will check CT renal colic next year to assess both stone burden as well as to surveil AML.   . Leukocytoclastic vasculitis ([place]) 10/01/2017  . Localized edema 01/04/2018  . Lumbar radicular pain 01/19/2016  . Maculopapular rash 09/03/2017  . Nephrolithiasis 07/23/2015   Overview:  x 3  Last Assessment &  Plan:  By [place] has left nephrolithiasis, but not visible by KUB. Will check CT renal colic next year to assess both stone burden as well as to surveil AML.   Overview:  x 3  Last Assessment & Plan:  Has 38m nonobstructing LUP stone - not visible by KUB.  Will check renal [place]2019 - he will contact office sooner if symptomatic.   . Non-sustained ventricular tachycardia ([place] 03/29/2017  . Other hyperlipidemia 03/29/2017  . Palpitations 10/01/2017  . Paroxysmal atrial fibrillation ([place] 03/29/2017  . Paroxysmal atrial fibrillation ([place] 03/29/2017  . Pleural effusion, bilateral 08/09/2017  . Post-nasal drainage 02/25/2016   Last Assessment & Plan:  Trial zyrtec and flonase  . Prostate cancer screening 06/20/2017   Last Assessment & Plan:  Recommend continued annual CaP screening until within 10 years of life expectancy. Given good health and fhx of longevity, would anticipate CaP screening to continue until age 72  PSA today and again in one year on day of visit.  . Pulmonary hypertension ([place] 08/09/2017  . Pulmonary nodules 06/12/2016  . S/P AVR (aortic valve replacement) 03/20/2016  . Supratherapeutic INR 07/26/2017  . Syncope 03/29/2017  . Syncope and collapse  02/01/2016  . Typical atrial flutter ([place]) 02/01/2016   Past Surgical History:  Procedure Laterality Date  . CHOLECYSTECTOMY    . CORONARY ARTERY BYPASS GRAFT    . EXPLORATORY LAPAROTOMY  07/30/2017  . FOOT SURGERY    . FRACTURE SURGERY Right    wrist and forearm  . HERNIA REPAIR    . MECHANICAL AORTIC VALVE REPLACEMENT    . NASAL SINUS SURGERY    . RIGHT HEART CATH N/A 01/18/2018   Procedure: RIGHT HEART CATH;  Surgeon: Jolaine Artist, MD;  Location: [place] CV LAB;  Service: Cardiovascular;  Laterality: N/A;  . RIGHT HEART CATH N/A 05/14/2018   Procedure: RIGHT HEART CATH;  Surgeon: Jolaine Artist, MD;  Location: [place] CV LAB;  Service: Cardiovascular;  Laterality: N/A;  . TEE WITHOUT CARDIOVERSION N/A 05/21/2018   Procedure:  TRANSESOPHAGEAL ECHOCARDIOGRAM (TEE);  Surgeon: Jolaine Artist, MD;  [facility]COPY;  Service: Cardiovascular;  Laterality: N/A;  . UPPER GASTROINTESTINAL ENDOSCOPY  07/12/2017   Patchy areas of mucosal inflammation noted in the antrum with edema,erthema and ulcerations. Bx. Chronicfocally active gastritis.  [name] VASECTOMY       Current Meds  Medication Sig  . acetaminophen (TYLENOL) 650 MG CR tablet Take 1 tablet (650 mg total) by mouth every 8 (eight) hours as needed for pain. Not to use with norco since combined with tylenol.  [name] albuterol (PROVENTIL HFA;VENTOLIN HFA) 108 (90 Base) MCG/ACT inhaler Inhale 2 puffs into the lungs as needed for wheezing or shortness of breath.  [name] aspirin EC 81 MG tablet Take 81 mg by mouth daily.  . B Complex-C (B-COMPLEX WITH VITAMIN C) tablet Take 1 tablet by mouth daily.   . cetirizine (ZYRTEC ALLERGY) 10 MG tablet Take 10 mg by mouth daily as needed for allergies.   . Cholecalciferol (VITAMIN D) 125 MCG (5000 UT) CAPS Take 1 capsule by mouth daily.  . Coenzyme Q10 (COQ10 PO) Take 1 tablet by mouth daily.  . macitentan (OPSUMIT) 10 MG tablet Take 1 tablet (10 mg total) by mouth daily.  . Magnesium Gluconate (MAGNESIUM 27 PO) Take 1 tablet by mouth daily.  . midodrine (PROAMATINE) 10 MG tablet Take 1 tablet (10 mg total) by mouth 3 (three) times daily with meals.  . multivitamin-lutein (OCUVITE-LUTEIN) CAPS capsule Take 1 capsule by mouth daily.  . pantoprazole (PROTONIX) 40 MG tablet Take 1 tablet (40 mg total) by mouth daily.  . potassium chloride (K-DUR) 10 MEQ tablet Take 2 tablets (20 mEq total) by mouth daily.  . rosuvastatin (CRESTOR) 5 MG tablet Take 1 tablet (5 mg total) by mouth daily.  [name] spironolactone (ALDACTONE) 25 MG tablet TAKE 1/2 OF A TABLET BY MOUTH ONCE DAILY.  . tadalafil, PAH, (ADCIRCA) 20 MG tablet TAKE 2 TABLETS BY MOUTH ONCE A DAY  . torsemide (DEMADEX) 20 MG tablet Take 1 tablet (20 mg total) by mouth 2 (two) times daily as  needed (will take 3 times a day if needed).  . warfarin (COUMADIN) 5 MG tablet As directed by coumadin clinic     Allergies:   Amoxicillin-pot clavulanate and Tape   Social History   Tobacco Use  . Smoking status: Former Smoker    Packs/day: 2.00    Years: 34.00    Pack years: 68.00    Types: Cigarettes    Last attempt to quit: 07/16/2000    Years since quitting: 18.7  . Smokeless tobacco: Never Used  Substance Use Topics  . Alcohol use:  Yes  . Drug use: No     Family Hx: The patient's family history includes Arthritis in his mother; Asthma in his mother; Heart attack in his father; Hypertension in his father; Stroke in his paternal grandmother. There is no history of Colon cancer.  ROS:   Please see the history of present illness.     All other systems reviewed and are negative.   Labs/Other Tests and Data Reviewed:    Recent Labs: 05/15/2018: TSH 2.768 05/16/2018: Magnesium 1.8 10/02/2018: B Natriuretic Peptide 206.0 01/01/2019: ALT 13; BUN 29; Creatinine, Ser 1.15; Hemoglobin 13.6; Platelets 302.0; Potassium 4.2; Sodium 136   Recent Lipid Panel Lab Results  Component Value Date/Time   CHOL 125 10/21/2018 10:28 AM   TRIG 57 10/21/2018 10:28 AM   HDL 43 10/21/2018 10:28 AM   CHOLHDL 2.9 10/21/2018 10:28 AM   LDLCALC 71 10/21/2018 10:28 AM    Wt Readings from Last 3 Encounters:  03/27/19 146 lb (66.2 kg)  02/17/19 144 lb (65.3 kg)  02/11/19 151 lb 3.2 oz (68.6 kg)     Objective:    Vital Signs:  BP 120/69 (BP Location: Left Arm, Patient Position: Sitting)   Pulse (!) 54   Ht _0  (1.753 m)   Wt 146 lb (66.2 kg)   SpO2 98%   BMI 21.56 kg/m      ASSESSMENT & PLAN:    1.  Stable heart rate runs in the 50-70 rate at home he has chronic flutter is his heart rhythm and is anticoagulated with mechanical AVR goal INR is 3.  He has arrangements to follow-up in our warfarin clinic.  At this time does not require suppressant treatment and has not had symptomatic  bradycardia 2.  Anticoagulation continue to follow in our warfarin clinic goal INR 3 3.  Pulmonary artery hypertension symptomatically doing well continue current treatment unable to afford other novel treatments with financial limitation 4.  Chronic hypotension improved continue midodrine etc. remarkable affecting the quality of his life 5.  Coronary artery disease stable asymptomatic New York Heart Association class I continue medical therapy and I would not advise an ischemia evaluation 6.  CKD improved once we get through the peak of coronavirus need to bring him to our office and recheck renal function. 7.  Hyperlipidemia stable continue statin will recheck liver function when I see him in the office along with lipid profile 8 hypertensive heart disease with heart failure improved New York Heart Association class I continue his current loop diuretic  COVID-19 Education: The signs and symptoms of COVID-19 were discussed with the patient and how to seek care for testing (follow up with PCP or arrange E-visit).  The importance of social distancing was discussed today.  Patient Risk:   After full review of this patient's clinical status, I feel that they are at least moderate risk at this time.  Time:   Today, I have spent 26 minutes with the patient with telehealth technology discussing his complicated cardiac history including coronary artery disease valvular heart disease mechanical AVR atrial arrhythmia anticoagulation hypotension requiring midodrine hyperlipidemia and pulmonary artery hypertension managed in our advanced heart failure program in [place].  Patient had many questions we had a long discussion regarding his treatment financial limitations and ongoing care..     Medication Adjustments/Labs and Tests Ordered: Current medicines are reviewed at length with the patient today.  Concerns regarding medicines are outlined above.  Tests Ordered: No orders of the defined types were  placed  in this encounter.  Medication Changes: No orders of the defined types were placed in this encounter.   Disposition:  Follow up in 3 month(s)  Signed, Shirlee More, MD  03/27/2019 11:06 AM    [place]

## 2019-03-27 NOTE — Telephone Encounter (Signed)
Pt reported blood in stool.  Please advise.

## 2019-03-28 ENCOUNTER — Ambulatory Visit: Payer: Medicare Other | Admitting: Family Medicine

## 2019-03-28 ENCOUNTER — Other Ambulatory Visit: Payer: Self-pay

## 2019-03-28 MED ORDER — HYDROCORTISONE 2.5 % RE CREA: 1.0000 "application " | TOPICAL_CREAM | Freq: Two times a day (BID) | RECTAL | 0 refills | Status: AC

## 2019-03-28 NOTE — Telephone Encounter (Signed)
He would have occasional rectal bleeding in the past as well. Hemoglobin in January was normal. Still could have internal hemorrhoids.  Plan: -Can use Preparation H 1 twice daily after the bowel movement for 7 to 10 days. -Make sure that he is not constipated.  He does have previous history of constipation.  If he is, start Colace 1 tablet p.o. once a day. -If he has brisk bleeding, gets dizzy-needs to come to ED. -Cologuard is going to be positive with any blood in the stool.  Hence, would not recommend. -Continue Coumadin for now unless there is brisk bleeding.

## 2019-03-28 NOTE — Telephone Encounter (Signed)
Patient contacted.  Suppositories are not on his formulary. He agrees to cream using the rectal applicator tip.

## 2019-04-03 ENCOUNTER — Ambulatory Visit (HOSPITAL_COMMUNITY)
Admission: RE | Admit: 2019-04-03 | Discharge: 2019-04-03 | Disposition: A | Discharge status: Home / Self Care | Payer: Medicare Other | Source: Ambulatory Visit | Attending: Internal Medicine | Admitting: Internal Medicine

## 2019-04-03 ENCOUNTER — Other Ambulatory Visit: Payer: Self-pay

## 2019-04-03 DIAGNOSIS — I2510 Atherosclerotic heart disease of native coronary artery without angina pectoris: Secondary | ICD-10-CM

## 2019-04-03 DIAGNOSIS — Z952 Presence of prosthetic heart valve: Secondary | ICD-10-CM

## 2019-04-03 DIAGNOSIS — I4892 Unspecified atrial flutter: Secondary | ICD-10-CM

## 2019-04-03 DIAGNOSIS — I2781 Cor pulmonale (chronic): Secondary | ICD-10-CM

## 2019-04-03 DIAGNOSIS — I272 Pulmonary hypertension, unspecified: Secondary | ICD-10-CM

## 2019-04-03 MED ORDER — TORSEMIDE 20 MG PO TABS: 20.0000 mg | ORAL_TABLET | Freq: Two times a day (BID) | ORAL | 3 refills | Status: DC | PRN

## 2019-04-03 MED ORDER — MIDODRINE HCL 10 MG PO TABS: 10.0000 mg | ORAL_TABLET | Freq: Three times a day (TID) | ORAL | 6 refills | Status: DC

## 2019-04-03 NOTE — Progress Notes (Signed)
Heart Failure TeleHealth Note  Due to national recommendations of social distancing due to [name], Audio/video telehealth visit is felt to be most appropriate for this patient at this time.  See MyChart message from today for patient consent regarding telehealth for [facility].  Date:  04/03/2019   ID:  Lance Hicks, DOB [birth year], MRN [id number]  Location: Home  Provider location: [facility] Type of Visit: Established patient  PCP:  [name], PA-C  Cardiologist:  No primary care provider on file. 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  In 8/18 admitted for acute pancreatitis. Underwent lap cholecystectomy on 07/29/2017. On 8/6 hedeveloped hypotension and was taken back for open laparotomy due to bleeding from an unclear omental source.He was given fluids and 8 units PRBC perioperatively. He says his breathing has been bad since that time.   Underwent RHC on 01/18/18 with severe PAH and cor pulmonale. Macitentan and Adcirca started. .   Admitted 5/21-5/30/19 with symptomatic hypotension and weight gain. He had a repeat RHC and echo with bubble study (results below). He was started on midodrine for BP support. Diuresed 25 lbs with IV lasix, then transitioned to torsemide 60 mg daily. Abdominal US showed cirrhosis with no evidence of portal HTN. Pulm consulted for possible ILD on High-res chest CT. Ortho consulted for right rotator cuff tear from horse accident PTA. He was treated with antibiotics for Bacteremia. DC weight: 140 lbs.   Doing  very well. Not going to gym because it is closed. Walking up and down driveway at very steep grade without problem. No edema, orthopnea or PND. Tolerating Adcirca and Macitentan without problem. Only tolerating Uptravi 200 daily due to severe calf cramping. No dizziness, syncope or presyncope. Weight stable. On midodrine 10/03/14. BP 115-120/60. No CP.No bleeding on coumadin.    [name] denies symptoms worrisome for COVID 19.   Past Medical History:  Diagnosis Date  . 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 In light of concurrent left nephrolithiasis, will check CT renal colic next year instead of renal US.   [name] Anticoagulated on Coumadin 01/04/2018  . Anxiety 05/10/2016   Last Assessment & Plan:  Doing well off of zoloft.  . Asymptomatic microscopic hematuria 06/20/2017   Last Assessment & Plan:  Had hematuria workup in [facility] in 2016 which negative CT and cystoscopy. UA with 2+ blood last visit - we discussed recommendation for repeat workup at 5 years or if degree of hematuria progresses.   . Atrial fibrillation ([place]) 03/29/2017  . Atrial flutter ([place]) 03/29/2017  . Chronic allergic rhinitis 04/28/2016   Last Assessment & Plan:  Continue astelin  . Chronic anticoagulation 03/29/2017  . Chronic atrial fibrillation 07/23/2015   Last Assessment & Plan:  Coumadin and metoprolol, cardiology referral to establish care.  . Chronic midline back pain 12/14/2015   Last Assessment & Plan:  Pain management referral for further evaluation.  . Chronic prostatitis 07/23/2015   Last Assessment & Plan:  Has largely  resolved since stopping bike riding. Recommend annual DRE AND PSA - will see back 12/2015 for annual screening, given 1st degree fhx. To call office for recurrent prostatitis symptoms.   [name] COPD (chronic obstructive pulmonary disease) ([place]) 06/12/2016  . Coronary artery disease involving native coronary artery of native heart with angina  pectoris ([place]) 03/29/2017  . Cough 10/17/2016   Last Assessment & Plan:  Discussed typical course for acute viral illness. If symptoms worsen or fail to improve by 7-10d, delayed ATBs, fluids, rest, NSAIDs/APAP prn. Seek care if not improving. Needs earlier INR check due to ATBs.  [name] Dyspnea 02/01/2016   Last Assessment & Plan:  Overall improving, eval by pulm, plan for CT, neg stress test with cardiology. Recent switch to carvedilol due to side effects.  . Epidermoid cyst of skin 08/24/2017  . Essential hypertension 12/14/2015   Last Assessment & Plan:  Hypertension control: controlled  Medications: compliant Medication Management: as noted in orders Home blood pressure monitoring recommended additionally as needed for symptoms  The patient's care plan was reviewed and updated. Instructions and counseling were provided regarding patient goals and barriers. He was counseled to adopt a healthy lifestyle. Educational resources and self-management tools have been provided as charted in [facility] list.   . H/O maze procedure 03/29/2017  . [name] 03/29/2017   Overview:  2011  . Hx of CABG 03/29/2017  . Hyperlipidemia 03/29/2017  . Hypertensive heart disease 03/29/2017  . Kidney stones 07/23/2015   Overview:  x 3  Last Assessment & Plan:  By [place] has left nephrolithiasis, but not visible by KUB. Will check CT renal colic next year to assess both stone burden as well as to surveil AML.   . Leukocytoclastic vasculitis ([place]) 10/01/2017  . Localized edema 01/04/2018  . Lumbar radicular pain 01/19/2016  . Maculopapular rash 09/03/2017  . Nephrolithiasis 07/23/2015   Overview:  x 3  Last Assessment & Plan:  By [place] has left nephrolithiasis, but not visible by KUB. Will check CT renal colic next year to assess both stone burden as well as to surveil AML.   Overview:  x 3  Last Assessment & Plan:  Has 43m nonobstructing LUP stone - not visible by KUB.  Will check renal [place]/2019 - he will contact office  sooner if symptomatic.   . Non-sustained ventricular tachycardia ([place] 03/29/2017  . Other hyperlipidemia 03/29/2017  . Palpitations 10/01/2017  . Paroxysmal atrial fibrillation ([place] 03/29/2017  . Paroxysmal atrial fibrillation ([place] 03/29/2017  . Pleural effusion, bilateral 08/09/2017  . Post-nasal drainage 02/25/2016   Last Assessment & Plan:  Trial zyrtec and flonase  . Prostate cancer screening 06/20/2017   Last Assessment & Plan:  Recommend continued annual CaP screening until within 10 years of life expectancy. Given good health and fhx of longevity, would anticipate CaP screening to continue until age 72  PSA today and again in one year on day of visit.  . Pulmonary hypertension ([place] 08/09/2017  . Pulmonary nodules 06/12/2016  . S/P AVR (aortic valve replacement) 03/20/2016  . Supratherapeutic INR 07/26/2017  . Syncope 03/29/2017  . Syncope and collapse 02/01/2016  . Typical atrial flutter ([place] 02/01/2016   Past Surgical History:  Procedure Laterality Date  . CHOLECYSTECTOMY    . CORONARY ARTERY BYPASS GRAFT    . EXPLORATORY LAPAROTOMY  07/30/2017  . FOOT SURGERY    . FRACTURE SURGERY Right    wrist and forearm  . HERNIA REPAIR    .  MECHANICAL AORTIC VALVE REPLACEMENT    . NASAL SINUS SURGERY    . RIGHT HEART CATH N/A 01/18/2018   Procedure: RIGHT HEART CATH;  Surgeon: Jolaine Artist, MD;  Location: [facility];  Service: Cardiovascular;  Laterality: N/A;  . RIGHT HEART CATH N/A 05/14/2018   Procedure: RIGHT HEART CATH;  Surgeon: Jolaine Artist, MD;  Location: [facility];  Service: Cardiovascular;  Laterality: N/A;  . TEE WITHOUT CARDIOVERSION N/A 05/21/2018   Procedure: TRANSESOPHAGEAL ECHOCARDIOGRAM (TEE);  Surgeon: Jolaine Artist, MD;  Location: [facility] ENDOSCOPY;  Service: Cardiovascular;  Laterality: N/A;  . UPPER GASTROINTESTINAL ENDOSCOPY  07/12/2017   Patchy areas of mucosal inflammation noted in the antrum with edema,erthema and ulcerations. Bx. Chronicfocally  active gastritis.  [name] VASECTOMY       Current Outpatient Medications  Medication Sig Dispense Refill  . acetaminophen (TYLENOL) 650 MG CR tablet Take 1 tablet (650 mg total) by mouth every 8 (eight) hours as needed for pain. Not to use with norco since combined with tylenol. 30 tablet 0  . albuterol (PROVENTIL HFA;VENTOLIN HFA) 108 (90 Base) MCG/ACT inhaler Inhale 2 puffs into the lungs as needed for wheezing or shortness of breath.    [name] aspirin EC 81 MG tablet Take 81 mg by mouth daily.    . B Complex-C (B-COMPLEX WITH VITAMIN C) tablet Take 1 tablet by mouth daily.     . cetirizine (ZYRTEC ALLERGY) 10 MG tablet Take 10 mg by mouth daily as needed for allergies.     . Cholecalciferol (VITAMIN D) 125 MCG (5000 UT) CAPS Take 1 capsule by mouth daily.    . Coenzyme Q10 (COQ10 PO) Take 1 tablet by mouth daily.    . hydrocortisone (ANUSOL-HC) 2.5 % rectal cream Place 1 application rectally 2 (two) times daily for 10 days. 30 g 0  . macitentan (OPSUMIT) 10 MG tablet Take 1 tablet (10 mg total) by mouth daily. 30 tablet 0  . Magnesium Gluconate (MAGNESIUM 27 PO) Take 1 tablet by mouth daily.    . midodrine (PROAMATINE) 10 MG tablet Take 1 tablet (10 mg total) by mouth 3 (three) times daily with meals. 90 tablet 6  . multivitamin-lutein (OCUVITE-LUTEIN) CAPS capsule Take 1 capsule by mouth daily.    . pantoprazole (PROTONIX) 40 MG tablet Take 1 tablet (40 mg total) by mouth daily. 30 tablet 11  . potassium chloride (K-DUR) 10 MEQ tablet Take 2 tablets (20 mEq total) by mouth daily. 184 tablet 2  . rosuvastatin (CRESTOR) 5 MG tablet Take 1 tablet (5 mg total) by mouth daily. 30 tablet 6  . spironolactone (ALDACTONE) 25 MG tablet TAKE 1/2 OF A TABLET BY MOUTH ONCE DAILY. 45 tablet 3  . tadalafil, PAH, (ADCIRCA) 20 MG tablet TAKE 2 TABLETS BY MOUTH ONCE A DAY 60 tablet 12  . torsemide (DEMADEX) 20 MG tablet Take 1 tablet (20 mg total) by mouth 2 (two) times daily as needed (will take 3 times a day if  needed). 90 tablet 3  . warfarin (COUMADIN) 5 MG tablet As directed by coumadin clinic 30 tablet 3   No current facility-administered medications for this encounter.     Allergies:   Amoxicillin-pot clavulanate and Tape   Social History:  The patient  reports that he quit smoking about 18 years ago. His smoking use included cigarettes. He has a 68.00 pack-year smoking history. He has never used smokeless tobacco. He reports current alcohol use. He reports that he does  not use drugs.   Family History:  The patient's family history includes Arthritis in his mother; Asthma in his mother; Heart attack in his father; Hypertension in his father; Stroke in his paternal grandmother.   ROS:  Please see the history of present illness.   All other systems are personally reviewed and negative.   Exam:  (Video/Tele Health Call; Exam is subjective and or/visual.) General:  Speaks in full sentences. No resp difficulty. Lungs: Normal respiratory effort with conversation.  Abdomen: Non-distended per patient report Extremities: Pt denies edema. Neuro: Alert & oriented x 3.   Recent Labs: 05/15/2018: TSH 2.768 05/16/2018: Magnesium 1.8 10/02/2018: B Natriuretic Peptide 206.0 01/01/2019: ALT 13; BUN 29; Creatinine, Ser 1.15; Hemoglobin 13.6; Platelets 302.0; Potassium 4.2; Sodium 136  Personally reviewed   Wt Readings from Last 3 Encounters:  03/27/19 66.2 kg (146 lb)  02/17/19 65.3 kg (144 lb)  02/11/19 68.6 kg (151 lb 3.2 oz)      ASSESSMENT AND PLAN:  1. Pulmonary hypertension with cor pulmonale/RV failure - WHO Group I  - ? Component of HHT/shunt/AVMs with late bubbles on bubble study.  - Echo with bubble study 05/16/18 LVEF 55-60% with "late bubbles" , Mechanical AVR stable with trivial perivalvular regurg, Mild MR, Severe LAE, Severe RV dilation and reduced function. No PFO. Mod TR, PA peak pressure 68 mm Hg - Echo 10/19: EF 55-60% mild LVH; s/p AVR with mean gradient 11 mmHg and trace AI; mild  MR; severe biatrial enlargement; mild RVE; severe TR; severe pulmonary hypertension. RVSP 62mHG - RHC 1/19 with mod/severe pulm HTN with RV failure.  - RHC 05/14/18 with Mild/Moderate PAH in setting of high output with no evidence of intracardiac shunting. - Ab u/s with cirrhosis but no evidence of portal HTN - Improved with Macitentan and Adcirca. - Did not tolerate selexipag 200 bid due to calf cramping. Will continue 200 daily. Can retitrate again down the road.\ - Doing well. Now NYHA II  - Volume statusstable. - Continue torsemide 40 mg per day. Okay to take extra 20 mg PRN.  - Continue spiro 12.5 mg daily. BMET today.  - Continue midodrine 10 mg am and afternoon, 15 mg at night - Auto-immune serologies negative (checked twice) - 6MW 12/19-  548 meters O2 Room air range 74-93%.   2. Chronic respiratory failure - [place] CT 5/23/19with "dependent basilar predominant patchy subpleural reticulation and ground-glass attenuation with the suggestion of minimal associated traction bronchiolectasis. No frank honeycombing. Findings may indicate an interstitial lung disease such as early usual interstitial pneumonia (UIP) or fibrotic phase nonspecific interstitial pneumonia (NSIP)." - Followed by Dr. BLamonte Sakai.  3. Chronic AFL - Rate-controlled. Now off metoprolol. Continue coumadin with mechanical AVR.   4. CAD - s/p CABG 07/2017 with Dr ARoderic Palauin [place]  - No s/s ischemia - continue ASA.  - Unable to tolerate atorva due to myalgias.  - Continue Crestor 592mdaily  5. S/p mechanical AVR - stable on most recent echo. Continue coumadin/ASA 81. INR goal is 2.5-3 per Dr MuBettina Gavia- aware of need for SBE prophylaxis  6. Leukocytoclastic vasculitis - f/u with Rheumatology. No change.  7. Cirrhosis - USKoreabdomen RUQ 05/16/18 - s/p cholecystectomy. + hepatic cirrhosis. Mild ascites.  - Liver Doppler 05/16/18 - No hepatic, splenic, or portal venous thrombosis or occlusion. Mild ascites. -  Continue midodrine 10 mg am, afternoon, 15 mg HS   COVID screen The patient does not have any symptoms that suggest any further testing/  screening at this time.  Social distancing reinforced today.  Recommended follow-up:  As above  Relevant cardiac medications were reviewed at length with the patient today.   The patient does not have concerns regarding their medications at this time.   The following changes were made today:  As above  Today, I have spent 18 minutes with the patient with telehealth technology discussing the above issues .    Signed, Glori Bickers, MD  04/03/2019 10:44 AM  Advanced Heart Failure [address] [place] [id number] [phone] (office) [phone] (fax)

## 2019-04-03 NOTE — Addendum Note (Signed)
Encounter addended by: Marlise Eves, RN on: 04/03/2019 11:42 AM  Actions taken: Pharmacy for encounter modified, Order list changed, Clinical Note Signed

## 2019-04-03 NOTE — Patient Instructions (Signed)
Please follow up with Dr. Haroldine Laws in 6 months

## 2019-04-03 NOTE — Progress Notes (Signed)
Spoke to pt about after visit instructions. Pt verbalized understanding about follow up visit. Torsemide and midodrine refilled. No other questions at this time.

## 2019-04-07 ENCOUNTER — Telehealth: Payer: Self-pay

## 2019-04-07 ENCOUNTER — Encounter (HOSPITAL_COMMUNITY): Payer: Medicare Other | Admitting: Internal Medicine

## 2019-04-07 NOTE — Telephone Encounter (Signed)

## 2019-04-07 NOTE — Telephone Encounter (Signed)
lmom for prescreen

## 2019-04-08 ENCOUNTER — Ambulatory Visit: Payer: Medicare Other | Admitting: Emergency Medicine

## 2019-04-08 ENCOUNTER — Ambulatory Visit (INDEPENDENT_AMBULATORY_CARE_PROVIDER_SITE_OTHER): Payer: Medicare Other | Admitting: *Deleted

## 2019-04-08 ENCOUNTER — Other Ambulatory Visit: Payer: Self-pay

## 2019-04-08 DIAGNOSIS — I4892 Unspecified atrial flutter: Secondary | ICD-10-CM | POA: Diagnosis not present

## 2019-04-08 DIAGNOSIS — Z5181 Encounter for therapeutic drug level monitoring: Secondary | ICD-10-CM | POA: Diagnosis not present

## 2019-04-08 DIAGNOSIS — Z7901 Long term (current) use of anticoagulants: Secondary | ICD-10-CM | POA: Diagnosis not present

## 2019-04-08 LAB — POCT INR: INR: 3.6 — AB (ref 2.0–3.0)

## 2019-04-08 NOTE — Patient Instructions (Signed)
Description   Spoke with pt and instructed to hold today's dose then continue your dose 2.40m daily except 557mon Sundays, Tuesdays and Thursdays.  Please call our office with any medication changes or concerns [phone]. Return in 2 weeks for INR check.

## 2019-04-09 ENCOUNTER — Encounter: Payer: Medicare Other | Admitting: Physical Therapy

## 2019-04-21 ENCOUNTER — Telehealth: Payer: Self-pay

## 2019-04-21 NOTE — Telephone Encounter (Signed)

## 2019-04-22 ENCOUNTER — Other Ambulatory Visit: Payer: Self-pay

## 2019-04-22 ENCOUNTER — Ambulatory Visit (INDEPENDENT_AMBULATORY_CARE_PROVIDER_SITE_OTHER): Payer: Medicare Other | Admitting: *Deleted

## 2019-04-22 DIAGNOSIS — I4892 Unspecified atrial flutter: Secondary | ICD-10-CM | POA: Diagnosis not present

## 2019-04-22 DIAGNOSIS — Z5181 Encounter for therapeutic drug level monitoring: Secondary | ICD-10-CM

## 2019-04-22 DIAGNOSIS — Z7901 Long term (current) use of anticoagulants: Secondary | ICD-10-CM

## 2019-04-22 LAB — POCT INR: INR: 3.7 — AB (ref 2.0–3.0)

## 2019-04-22 NOTE — Patient Instructions (Signed)
Description   Spoke with pt and instructed to hold today's dose then start taking 2.12m daily except 578mon Sundays and Thursdays.  Please call our office with any medication changes or concerns [phone]. Return in 2 weeks for INR check.

## 2019-05-05 ENCOUNTER — Telehealth: Payer: Self-pay

## 2019-05-05 NOTE — Telephone Encounter (Signed)
1. Do you currently have a fever? No yes = cancel and refer to pcp for e-visit) 2. Have you recently travelled on a cruise, internationally, or to [place], [place], [place], [place], [place], or [place], [place] [employer]) ? no (yes = cancel, stay home, monitor symptoms, and contact pcp or initiate e-visit if symptoms develop) 3. Have you been in contact with someone that is currently pending confirmation of Covid19 testing or has been confirmed to have the [place] virus?  no (yes = cancel, stay home, away from tested individual, monitor symptoms, and contact pcp or initiate e-visit if symptoms develop) 4. Are you currently experiencing fatigue or cough? no (yes = pt should be prepared to have a mask placed at the time of their visit).  Pt. Advised that we are restricting visitors at this time and anyone present in the vehicle should meet the above criteria as well. Advised that visit will be at curbside for finger stick ONLY and will receive call with instructions. Pt also advised to please bring own pen for signature of arrival document.

## 2019-05-05 NOTE — Telephone Encounter (Signed)
lmom for prescreen  

## 2019-05-06 ENCOUNTER — Ambulatory Visit (INDEPENDENT_AMBULATORY_CARE_PROVIDER_SITE_OTHER): Payer: Medicare Other

## 2019-05-06 ENCOUNTER — Other Ambulatory Visit: Payer: Self-pay

## 2019-05-06 DIAGNOSIS — Z5181 Encounter for therapeutic drug level monitoring: Secondary | ICD-10-CM | POA: Diagnosis not present

## 2019-05-06 DIAGNOSIS — Z7901 Long term (current) use of anticoagulants: Secondary | ICD-10-CM | POA: Diagnosis not present

## 2019-05-06 DIAGNOSIS — I4892 Unspecified atrial flutter: Secondary | ICD-10-CM | POA: Diagnosis not present

## 2019-05-06 LAB — POCT INR: INR: 2.7 (ref 2.0–3.0)

## 2019-05-09 ENCOUNTER — Telehealth (HOSPITAL_COMMUNITY): Payer: Self-pay | Admitting: *Deleted

## 2019-05-09 NOTE — Telephone Encounter (Signed)
Patient left VM stating he hasn't been urinating as frequent as normal. His weight is up 5lbs in about 2-3 weeks. Patient does not have sob, edema, or chest pain.  Message routed to [place]  for advice

## 2019-05-09 NOTE — Telephone Encounter (Signed)
Looks like he is taking torsemide 40 mg daily by our note (med list says PRN?). If that's correct, he can take additional 20 mg today and tomorrow. Thanks

## 2019-05-09 NOTE — Telephone Encounter (Signed)
Pt states he take 61m of torsemide daily. He is aware and agreeable with plan. Pt will call our office if this does not help.

## 2019-05-14 ENCOUNTER — Other Ambulatory Visit: Payer: Self-pay | Admitting: Gastroenterology

## 2019-05-21 ENCOUNTER — Telehealth (INDEPENDENT_AMBULATORY_CARE_PROVIDER_SITE_OTHER): Payer: Medicare Other | Admitting: Gastroenterology

## 2019-05-21 ENCOUNTER — Other Ambulatory Visit: Payer: Self-pay

## 2019-05-21 ENCOUNTER — Encounter: Payer: Self-pay | Admitting: Gastroenterology

## 2019-05-21 VITALS — Ht 69.5 in | Wt 151.0 lb

## 2019-05-21 DIAGNOSIS — K5900 Constipation, unspecified: Secondary | ICD-10-CM | POA: Diagnosis not present

## 2019-05-21 DIAGNOSIS — K7460 Unspecified cirrhosis of liver: Secondary | ICD-10-CM

## 2019-05-21 DIAGNOSIS — K219 Gastro-esophageal reflux disease without esophagitis: Secondary | ICD-10-CM

## 2019-05-21 DIAGNOSIS — K625 Hemorrhage of anus and rectum: Secondary | ICD-10-CM

## 2019-05-21 MED ORDER — OMEPRAZOLE 20 MG PO CPDR: 20.0000 mg | DELAYED_RELEASE_CAPSULE | Freq: Every day | ORAL | 11 refills | Status: DC

## 2019-05-21 NOTE — Patient Instructions (Signed)
If you are age 72 or older, your body mass index should be between 23-30. Your Body mass index is 21.98 kg/m. If this is out of the aforementioned range listed, please consider follow up with your Primary Care Provider.  If you are age 14 or younger, your body mass index should be between 19-25. Your Body mass index is 21.98 kg/m. If this is out of the aformentioned range listed, please consider follow up with your Primary Care Provider.   -Stop citracal. Start colace 1 tab po qd.  - Stop all carbonated drinks/chewing gums etc.  - Continue fluid and salt restricted diet as he has been doing.  - Continue torsemide and spironolactone.    - FU in 12 weeks  You have been scheduled for an abdominal ultrasound at [facility] (1st floor) on 06/23/19 at 9am. Please arrive 15 minutes prior to your appointment for registration. Make certain not to have anything to eat or drink 6 hours prior to your appointment. Should you need to reschedule your appointment, please contact radiology at [phone]. This test typically takes about 30 minutes to perform.  We have sent the following medications to your pharmacy for you to pick up at your convenience: Omeprazole  Thank you,  Dr. Jackquline Denmark

## 2019-05-21 NOTE — Progress Notes (Signed)
Chief Complaint: GERD  Referring Provider:  [name], PA-C      ASSESSMENT AND PLAN;   #1. GERD (s/p EGD 06/2017 - at [facility], [place] -report reviewed, mild gastritis with negative biopsies, no varices). Had allergic reaction to protonix. Now with bloating.  #2.  Liver Cirrhosis (likely cardiac liver cirrhosis). No ETOH. No evidence of portal hypertension.  Mild ascites likely due to right heart failure associated with cor pulmonale with pulmonary hypertension. Neg [place] with doppler 04/2018. Nl AFP 3.2 04/2019  #3. Rectal bleeding with constipation d/t internal hemorrhoids. Neg hemoccult. Neg colon in [place] (resolved).  Plan: - Stop citracal. Start colace 1 tab po qd. - Stop all carbonated drinks/chewing gums etc. - Changed Protonix to omeprazole 29m po qd #30, 11 refills. - Continue fluid and salt restricted diet as he has been doing. - Continue torsemide and spironolactone.   - [place]domen in 3-4 weeks (once Covid-19 scare is over) - FU in 12 weeks.  He is to call [place] 2 weeks if he does not feel better. - If still with problems and the above WU is negative, proceed with CT scan abdo/pelvis.     HPI:    Lance MIYAMOTOis a 73y.o. male  For follow-up visit Main complaint is of abdominal bloating and had a rash after taking Protonix. Wants to switch to something else.  Has occasional heartburn.  No further rectal bleeding.  Denies use of nonsteroidals, excessive sodas.  Has been using Citrucel as he would occasionally gets constipated especially after using torsemide.  [place]  He had to be on Lovenox bridging prior to colonoscopy which he did not like.  He would like to avoid colonoscopy at the present time.  Discharge diagnoses from recent discharge summary: 1. Pulmonary hypertension with cor pulmonale with right-sided failure. - On macitentan and adcirca 2. Chronic respiratory failure 3. Chronic atrial flutter 4.  CAD s/p CABG 5. S/p mechanical AVR 6. Leukocytoclastic vasculitis 7. Cirrhosis 8. Rotator cuff tear 9. Fever with enterococcal bacteremia, neg WU for endocarditis including transesophageal echo.    Past GI procedures : EGD 06/2011 as above.   Colonoscopy 2015. 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 In light of concurrent left nephrolithiasis, will check CT renal colic next year instead of renal [place]    Anticoagulated on Coumadin 01/04/2018   Anxiety 05/10/2016   Last Assessment & Plan:  Doing well off of zoloft.   Asymptomatic microscopic hematuria 06/20/2017   Last Assessment & Plan:  [facility]. UA with 2+ blood last visit - we discussed recommendation for repeat workup at 5 years or if degree of hematuria progresses.    Atrial fibrillation ([place] 03/29/2017   Atrial flutter ([place] 03/29/2017   Chronic allergic rhinitis 04/28/2016   Last Assessment & Plan:  Continue astelin   Chronic anticoagulation 03/29/2017   Chronic atrial fibrillation 07/23/2015   Last Assessment & Plan:  Coumadin and metoprolol, cardiology referral to establish care.   Chronic midline back pain 12/14/2015   Last Assessment & Plan:  Pain management referral for further evaluation.   Chronic prostatitis 07/23/2015   Last Assessment & Plan:  Has largely resolved since stopping bike riding. Recommend annual DRE AND PSA -  will see back 12/2015 for annual screening, given 1st degree fhx. To call office for recurrent prostatitis symptoms.    COPD (chronic obstructive pulmonary disease) ([place]) 06/12/2016   Coronary artery disease involving native coronary artery of native heart with angina pectoris ([place]) 03/29/2017   Cough 10/17/2016   Last Assessment & Plan:  Discussed typical course for acute viral illness. If symptoms worsen or  fail to improve by 7-10d, delayed ATBs, fluids, rest, NSAIDs/APAP prn. Seek care if not improving. Needs earlier INR check due to ATBs.   Dyspnea 02/01/2016   Last Assessment & Plan:  Overall improving, eval by pulm, plan for CT, neg stress test with cardiology. Recent switch to carvedilol due to side effects.   Epidermoid cyst of skin 08/24/2017   Essential hypertension 12/14/2015   Last Assessment & Plan:  Hypertension control: controlled  Medications: compliant Medication Management: as noted in orders Home blood pressure monitoring recommended additionally as needed for symptoms  The patient's care plan was reviewed and updated. Instructions and counseling were provided regarding patient goals and barriers. He was counseled to adopt a healthy lifestyle. Educational resources and self-management tools have been provided as charted in [facility] list.    H/O maze procedure 03/29/2017   [name] 03/29/2017   Overview:  2011   Hx of CABG 03/29/2017   Hyperlipidemia 03/29/2017   Hypertensive heart disease 03/29/2017   Kidney stones 07/23/2015   Overview:  x 3  Last Assessment & Plan:  By [place] has left nephrolithiasis, but not visible by KUB. Will check CT renal colic next year to assess both stone burden as well as to surveil AML.    Leukocytoclastic vasculitis ([place]) 10/01/2017   Localized edema 01/04/2018   Lumbar radicular pain 01/19/2016   Maculopapular rash 09/03/2017   Nephrolithiasis 07/23/2015   Overview:  x 3  Last Assessment & Plan:  By [place] has left nephrolithiasis, but not visible by KUB. Will check CT renal colic next year to assess both stone burden as well as to surveil AML.   Overview:  x 3  Last Assessment & Plan:  Has 2m nonobstructing LUP stone - not visible by KUB.  Will check renal [place]8/2019 - he will contact office sooner if symptomatic.    Non-sustained ventricular tachycardia ([place] 03/29/2017   Other hyperlipidemia 03/29/2017   Palpitations 10/01/2017    Paroxysmal atrial fibrillation ([facility]) 03/29/2017   Paroxysmal atrial fibrillation ([place] 03/29/2017   Pleural effusion, bilateral 08/09/2017   Post-nasal drainage 02/25/2016   Last Assessment & Plan:  Trial zyrtec and flonase   Prostate cancer screening 06/20/2017   Last Assessment & Plan:  Recommend continued annual CaP screening until within 10 years of life expectancy. Given good health and fhx of longevity, would anticipate CaP screening to continue until age 73  PSA today and again in one year on day of visit.   Pulmonary hypertension ([place] 08/09/2017   Pulmonary nodules 06/12/2016   S/P AVR (aortic valve replacement) 03/20/2016   Supratherapeutic INR 07/26/2017   Syncope 03/29/2017   Syncope and collapse 02/01/2016   Typical atrial flutter ([place] 02/01/2016    Past Surgical History:  Procedure Laterality Date   CHOLECYSTECTOMY     CORONARY ARTERY BYPASS GRAFT     EXPLORATORY LAPAROTOMY  07/30/2017   FOOT SURGERY     FRACTURE SURGERY Right    wrist and forearm   HERNIA REPAIR     MECHANICAL AORTIC VALVE REPLACEMENT     NASAL SINUS  SURGERY     RIGHT HEART CATH N/A 01/18/2018   Procedure: RIGHT HEART CATH;  Surgeon: Jolaine Artist, MD;  Location: [place] [facility];  Service: Cardiovascular;  Laterality: N/A;   RIGHT HEART CATH N/A 05/14/2018   Procedure: RIGHT HEART CATH;  Surgeon: Jolaine Artist, MD;  Location: [place] CV LAB;  Service: Cardiovascular;  Laterality: N/A;   TEE WITHOUT CARDIOVERSION N/A 05/21/2018   Procedure: TRANSESOPHAGEAL ECHOCARDIOGRAM (TEE);  Surgeon: Jolaine Artist, MD;  Location: [facility] ENDOSCOPY;  Service: Cardiovascular;  Laterality: N/A;   UPPER GASTROINTESTINAL ENDOSCOPY  07/12/2017   Patchy areas of mucosal inflammation noted in the antrum with edema,erthema and ulcerations. Bx. Chronicfocally active gastritis.   VASECTOMY      Family History  Problem Relation Age of Onset   Asthma Mother    Arthritis Mother    Heart attack  Father    Hypertension Father    Stroke Paternal Grandmother    Colon cancer Neg Hx     Social History   Tobacco Use   Smoking status: Former Smoker    Packs/day: 2.00    Years: 34.00    Pack years: 68.00    Types: Cigarettes    Last attempt to quit: 07/16/2000    Years since quitting: 18.8   Smokeless tobacco: Never Used  Substance Use Topics   Alcohol use: Yes   Drug use: No    Current Outpatient Medications  Medication Sig Dispense Refill   acetaminophen (TYLENOL) 650 MG CR tablet Take 1 tablet (650 mg total) by mouth every 8 (eight) hours as needed for pain. Not to use with norco since combined with tylenol. 30 tablet 0   albuterol (PROVENTIL HFA;VENTOLIN HFA) 108 (90 Base) MCG/ACT inhaler Inhale 2 puffs into the lungs as needed for wheezing or shortness of breath.     aspirin EC 81 MG tablet Take 81 mg by mouth daily.     B Complex-C (B-COMPLEX WITH VITAMIN C) tablet Take 1 tablet by mouth daily.      cetirizine (ZYRTEC ALLERGY) 10 MG tablet Take 10 mg by mouth daily as needed for allergies.      Cholecalciferol (VITAMIN D) 125 MCG (5000 UT) CAPS Take 1 capsule by mouth daily.     Coenzyme Q10 (COQ10 PO) Take 1 tablet by mouth daily.     macitentan (OPSUMIT) 10 MG tablet Take 1 tablet (10 mg total) by mouth daily. 30 tablet 0   Magnesium Gluconate (MAGNESIUM 27 PO) Take 1 tablet by mouth daily.     midodrine (PROAMATINE) 10 MG tablet Take 1 tablet (10 mg total) by mouth 3 (three) times daily with meals. 90 tablet 6   multivitamin-lutein (OCUVITE-LUTEIN) CAPS capsule Take 1 capsule by mouth daily.     potassium chloride (K-DUR) 10 MEQ tablet Take 2 tablets (20 mEq total) by mouth daily. 184 tablet 2   rosuvastatin (CRESTOR) 5 MG tablet Take 1 tablet (5 mg total) by mouth daily. 30 tablet 6   spironolactone (ALDACTONE) 25 MG tablet TAKE 1/2 OF A TABLET BY MOUTH ONCE DAILY. 45 tablet 3   tadalafil, PAH, (ADCIRCA) 20 MG tablet TAKE 2 TABLETS BY MOUTH ONCE A  DAY 60 tablet 12   torsemide (DEMADEX) 20 MG tablet Take 1 tablet (20 mg total) by mouth 2 (two) times daily as needed (will take 3 times a day if needed). 90 tablet 3   warfarin (COUMADIN) 5 MG tablet As directed by coumadin clinic (Patient taking differently: daily.  As directed by coumadin clinic) 30 tablet 3   pantoprazole (PROTONIX) 40 MG tablet TAKE 1 TABLET(40 MG) BY MOUTH DAILY (Patient not taking: Reported on 05/21/2019) 30 tablet 11   No current facility-administered medications for this visit.     Allergies  Allergen Reactions   Protonix [Pantoprazole Sodium]     Gets gassy and starting itching like crazy   Amoxicillin-Pot Clavulanate Diarrhea    Stomach pain Has patient had a PCN reaction causing immediate rash, facial/tongue/throat swelling, SOB or lightheadedness with hypotension: No Has patient had a PCN reaction causing severe rash involving mucus membranes or skin necrosis: No Has patient had a PCN reaction that required hospitalization: No Has patient had a PCN reaction occurring within the last 10 years: Yes If all of the above answers are "NO", then may proceed with Cephalosporin use.    Tape Rash and Other (See Comments)    Surgical tape    Review of Systems:  Has easy bruisability Multiple skin bruises.    Physical Exam:    Ht 5' 9.5" (1.765 m)    Wt 151 lb (68.5 kg)    BMI 21.98 kg/m  Filed Weights   05/21/19 0907  Weight: 151 lb (68.5 kg)   Not examined since it was a tele-visit  Data Reviewed: I have personally reviewed following labs and imaging studies  CBC: CBC Latest Ref Rng & Units 01/01/2019 07/23/2018 06/03/2018  WBC 4.0 - 10.5 K/uL 6.9 6.9 6.5  Hemoglobin 13.0 - 17.0 g/dL 13.6 12.5(L) 11.8(L)  Hematocrit 39.0 - 52.0 % 40.1 36.9(L) 36.7(L)  Platelets 150.0 - 400.0 K/uL 302.0 283 246    CMP: CMP Latest Ref Rng & Units 01/01/2019 11/13/2018 10/21/2018  Glucose 70 - 99 mg/dL 84 97 101(H)  BUN 6 - 23 mg/dL 29(H) 30(H) 26(H)  Creatinine  0.40 - 1.50 mg/dL 1.15 1.25 1.23  Sodium 135 - 145 mEq/L 136 135 139  Potassium 3.5 - 5.1 mEq/L 4.2 3.9 3.0(L)  Chloride 96 - 112 mEq/L 101 98 103  CO2 19 - 32 mEq/L _0 Calcium 8.4 - 10.5 mg/dL 9.9 9.9 9.6  Total Protein 6.0 - 8.3 g/dL 7.8 - -  Total Bilirubin 0.2 - 1.2 mg/dL 1.3(H) - -  Alkaline Phos 39 - 117 U/L 65 - -  AST 0 - 37 U/L 27 - -  ALT 0 - 53 U/L 13 - -   Hepatic Function Latest Ref Rng & Units 01/01/2019 05/18/2018 05/14/2018  Total Protein 6.0 - 8.3 g/dL 7.8 7.3 7.7  Albumin 3.5 - 5.2 g/dL 4.3 2.9(L) 3.0(L)  AST 0 - 37 U/L 27 39 37  ALT 0 - 53 U/L 13 13(L) 14(L)  Alk Phosphatase 39 - 117 U/L 65 69 78  Total Bilirubin 0.2 - 1.2 mg/dL 1.3(H) 2.5(H) 4.8(H)  Bilirubin, Direct 0.1 - 0.5 mg/dL - 1.1(H) -       Radiology Studies:  Ct Chest High Resolution  Result Date: 05/16/2018 IMPRESSION: 1. Slightly irregular 0.9 cm peripheral right middle lobe solid pulmonary nodule. 2. minimal associated traction bronchiolectasis. 3. Mild cardiomegaly. Minimal interlobular septal thickening and trace dependent bilateral pleural effusions. These findings suggest a mild component of congestive heart failure. 4. Small to moderate volume perihepatic ascites. 5. Ectatic 4.3 cm ascending thoracic aorta. 6. Left main and 3 vessel coronary atherosclerosis. Aortic Atherosclerosis (ICD10-I70.0) and Emphysema (ICD10-J43.9). Electronically Signed   By: Ilona Sorrel M.D.   On: 05/16/2018 16:11    US Liver Doppler:  Result  Date: 05/16/2018 CLINICAL DATA:  Hepatic cirrhosis. EXAM: DUPLEX ULTRASOUND OF LIVER TECHNIQUE: Color and duplex Doppler ultrasound was performed to evaluate the hepatic in-flow and out-flow vessels. COMPARISON:  None. FINDINGS: Portal Vein Velocities Main:  26 cm/sec Right:  14 cm/sec Left:  18 cm/sec Normal hepatopetal flow is noted in the portal veins. Hepatic Vein Velocities Right:  40 cm/sec Middle:  30 cm/sec Left:  32 cm/sec Normal hepatofugal flow is noted in the hepatic  veins. Hepatic Artery Velocity:  65 cm/sec Splenic Vein Velocity:  33.4 cm/sec Varices: Absent. Ascites: Present. Superior mesenteric vein velocity: 30 centimeters/second IMPRESSION: No Doppler evidence of hepatic, splenic or portal venous thrombosis or occlusion. Mild ascites is noted. Electronically Signed   By: Marijo Conception, M.D.   On: 05/16/2018 13:47   US Abdomen Limited Ruq:  Result Date: 05/16/2018 CLINICAL DATA:  Hepatic cirrhosis. EXAM: ULTRASOUND ABDOMEN LIMITED RIGHT UPPER QUADRANT COMPARISON:  None. FINDINGS: Gallbladder: Status post cholecystectomy. Common bile duct: Diameter: 3.5 mm which is within normal limits. Liver: No focal lesion identified. Mildly heterogeneous echotexture of hepatic parenchyma is noted with nodular hepatic contours suggesting cirrhosis. Mild ascites is noted. Portal vein is patent on color Doppler imaging with normal direction of blood flow towards the liver. IMPRESSION: Status post cholecystectomy. Findings consistent with hepatic cirrhosis. Mild ascites is noted around the liver. No focal abnormality is noted. Electronically Signed   By: Marijo Conception, M.D.   On: 05/16/2018 13:32    This service was provided via telemedicine.  The patient was located at home.  The provider was located in office.  The patient did consent to this telephone visit and is aware of possible charges through their insurance for this visit.   Time spent on call: 25 min     Carmell Austria, MD 05/21/2019, 10:52 AM  Cc: [name], PA-C

## 2019-05-28 ENCOUNTER — Telehealth (HOSPITAL_COMMUNITY): Payer: Self-pay | Admitting: Cardiology

## 2019-05-28 ENCOUNTER — Telehealth: Payer: Self-pay

## 2019-05-28 ENCOUNTER — Telehealth: Payer: Self-pay | Admitting: Gastroenterology

## 2019-05-28 NOTE — Telephone Encounter (Signed)
ADVISED NO MEDICATION CHANGES ARE NEEDED AT THIS TIME, WILL NEED TO SPEAK WITH PROVIDER FOR FURTHER ADVISE.  ADD ON CHF VIRTUAL VISIT 6/4 @ 12 PT AWARE AND APPRECIATIVE OF CALL.

## 2019-05-28 NOTE — Telephone Encounter (Signed)
Please set up for teleheath visit with me.  Amy Clegg NP-C 9:58 AM

## 2019-05-28 NOTE — Telephone Encounter (Signed)
How would you like to proceed?

## 2019-05-28 NOTE — Telephone Encounter (Signed)
Patient said that he toke him off of pantoprazole and has him now on omeprazole and it not working neither.

## 2019-05-28 NOTE — Telephone Encounter (Signed)
I thought he had rash with protonix. That is what he told me. Is it true?  If not, restart protonix 37m po qd, #30, 11 refills. If rash, he can try aciphex (rabeprazole) 294mpo qd, 30, 11 refills Thx

## 2019-05-28 NOTE — Telephone Encounter (Signed)
lmom to move to [place]/to prescreen

## 2019-05-28 NOTE — Telephone Encounter (Signed)
Patient called with concerns of decreased UOP Weight 150, normally 141 Mild swelling, and SOB DENIES CP  Was instructed to increase torsemide to 60 mg daily two weeks ago and weight has only went down x 2 lbs  Please advise

## 2019-05-29 ENCOUNTER — Telehealth (HOSPITAL_COMMUNITY): Payer: Self-pay | Admitting: Cardiology

## 2019-05-29 ENCOUNTER — Telehealth: Payer: Self-pay

## 2019-05-29 ENCOUNTER — Encounter (HOSPITAL_COMMUNITY): Payer: Self-pay

## 2019-05-29 ENCOUNTER — Other Ambulatory Visit: Payer: Self-pay

## 2019-05-29 ENCOUNTER — Ambulatory Visit (HOSPITAL_COMMUNITY)
Admission: RE | Admit: 2019-05-29 | Discharge: 2019-05-29 | Disposition: A | Discharge status: Home / Self Care | Payer: Medicare Other | Source: Ambulatory Visit | Attending: Adult Health | Admitting: Adult Health

## 2019-05-29 VITALS — BP 122/70 | HR 79 | Wt 146.0 lb

## 2019-05-29 DIAGNOSIS — I272 Pulmonary hypertension, unspecified: Secondary | ICD-10-CM | POA: Diagnosis not present

## 2019-05-29 DIAGNOSIS — Z952 Presence of prosthetic heart valve: Secondary | ICD-10-CM

## 2019-05-29 DIAGNOSIS — I2510 Atherosclerotic heart disease of native coronary artery without angina pectoris: Secondary | ICD-10-CM

## 2019-05-29 DIAGNOSIS — N189 Chronic kidney disease, unspecified: Secondary | ICD-10-CM

## 2019-05-29 NOTE — Telephone Encounter (Signed)
Pt aware of instructions and medication changes  follow up 6/12 @ 140

## 2019-05-29 NOTE — Patient Instructions (Addendum)
Continue Torsemide 60 mg daily for 2 days with an additional 20 MEQ of potassium Then resume your normal dose of potassium and torsemide  Your physician recommends that you schedule a follow-up appointment in: 1week with Dr Haroldine Laws 06/06/19 @ 140   Do the following things EVERYDAY: 1) Weigh yourself in the morning before breakfast. Write it down and keep it in a log. 2) Take your medicines as prescribed 3) Eat low salt foods-Limit salt (sodium) to 2000 mg per day.  4) Stay as active as you can everyday 5) Limit all fluids for the day to less than 2 liters

## 2019-05-29 NOTE — Telephone Encounter (Signed)
-----  Message from Conrad Milo, NP sent at 05/29/2019  1:58 PM EDT ----- Regarding: AVS Please set up for appoimtnet with DB next week.   He will need CBC, CMET, Mag at that visit. Also Reds Clip.   Continue torsemide 60 mg daily and he was instructed to take an extra 20 mg for the next 2 days.   Thks A

## 2019-05-29 NOTE — Telephone Encounter (Signed)
lmom for prescreen

## 2019-05-29 NOTE — Progress Notes (Signed)
Heart Failure TeleHealth Note  Due to national recommendations of social distancing due to Lance Hicks 19, Audio/video telehealth visit is felt to be most appropriate for this patient at this time.  See MyChart message from today for patient consent regarding telehealth for [facility].  Date:  05/29/2019   ID:  [name], DOB 1946/09/13, MRN [id number]  Location: Home  Provider location: [place] [facility] Type of Visit: Established patient   PCP:  [name], PA-C  Cardiologist:  No primary care provider on file. Primary HF: Dr Haroldine Laws  Chief Complaint: Heart Failure   History of Present Illness: Lance Hicks is a 73 y.o. male with a history of permanent AFL, CAD/aortic stenosis s/p CABG, AVR wih #25 Carbomedics valve in 2010, attempted Maze and LAA excision at [facility] in [place], [place] (quit smoking in 2002 - 1 ppd x 25 years), HL, HTN and PAH.  In 8/18 admitted for acute pancreatitis. Underwent lap cholecystectomy on 07/29/2017. On 8/6 hedeveloped hypotension and was taken back for open laparotomy due to bleeding from an unclear omental source.He was given fluids and 8 units PRBC perioperatively. He says his breathing has been bad since that time.   Underwent RHC on 01/18/18 with severe PAH and cor pulmonale. Macitentan and Adcirca started.   Admitted 5/21-5/30/19 with symptomatic hypotension and weight gain. He had a repeat RHC and echo with bubble study (results below). He was started on midodrine for BP support. Diuresed 25 lbs with IV lasix, then transitioned to torsemide 60 mg daily. Abdominal US showed cirrhosis with no evidence of portal HTN. Pulm consulted for possible ILD on High-res chest CT. Ortho consulted for right rotator cuff tear from horse accident PTA. He was treated with antibiotics for Bacteremia. DC weight: 140 lbs.   Says he has had a recent allergic reaction with protonix and prilosec.   He  presents via audio  conferencing for a telehealth visit today due to increase weight gain. Complaining of fatigue. Having some lower extremity edema. Mild dyspnea with exertion.  Denies PND/Orthopnea. Says he is not able to work out because [employer] is closed. Denies dizziness/sycope.  No bleeding issues. Appetite ok. No fever or chills. Weight at home trending up 141--->146.  pounds. Taking all medications.      he denies symptoms worrisome for COVID 19.   Past Medical History:  Diagnosis Date  . 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 In light of concurrent left nephrolithiasis, will check CT renal colic next year instead of renal US.   [name] Anticoagulated on Coumadin 01/04/2018  . Anxiety 05/10/2016   Last Assessment & Plan:  Doing well off of zoloft.  . Asymptomatic microscopic hematuria 06/20/2017   Last Assessment & Plan:  Had hematuria workup in [facility] in 2016 which negative CT and cystoscopy. UA with 2+ blood last visit - we discussed recommendation for repeat workup at 5 years or if degree of hematuria progresses.   . Atrial fibrillation ([place]) 03/29/2017  . Atrial flutter ([place]) 03/29/2017  . Chronic allergic rhinitis 04/28/2016   Last Assessment & Plan:  Continue astelin  . Chronic anticoagulation 03/29/2017  . Chronic atrial fibrillation 07/23/2015   Last Assessment & Plan:  Coumadin and metoprolol, cardiology referral to establish care.  . Chronic midline back pain 12/14/2015   Last Assessment & Plan:  Pain management referral for further evaluation.  . Chronic prostatitis 07/23/2015  Last Assessment & Plan:  Has largely resolved since stopping bike riding. Recommend annual DRE AND PSA - will see back 12/2015 for annual screening, given 1st degree fhx. To call office for recurrent prostatitis symptoms.   [name] COPD (chronic obstructive pulmonary disease) ([place]) 06/12/2016  . Coronary artery disease involving native coronary artery of native heart with angina  pectoris ([place]) 03/29/2017  . Cough 10/17/2016   Last Assessment & Plan:  Discussed typical course for acute viral illness. If symptoms worsen or fail to improve by 7-10d, delayed ATBs, fluids, rest, NSAIDs/APAP prn. Seek care if not improving. Needs earlier INR check due to ATBs.  [name] Dyspnea 02/01/2016   Last Assessment & Plan:  Overall improving, eval by pulm, plan for CT, neg stress test with cardiology. Recent switch to carvedilol due to side effects.  . Epidermoid cyst of skin 08/24/2017  . Essential hypertension 12/14/2015   Last Assessment & Plan:  Hypertension control: controlled  Medications: compliant Medication Management: as noted in orders Home blood pressure monitoring recommended additionally as needed for symptoms  The patient's care plan was reviewed and updated. Instructions and counseling were provided regarding patient goals and barriers. He was counseled to adopt a healthy lifestyle. Educational resources and self-management tools have been provided as charted in [facility] list.   . H/O maze procedure 03/29/2017  . [name] 03/29/2017   Overview:  2011  . Hx of CABG 03/29/2017  . Hyperlipidemia 03/29/2017  . Hypertensive heart disease 03/29/2017  . Kidney stones 07/23/2015   Overview:  x 3  Last Assessment & Plan:  By [place] has left nephrolithiasis, but not visible by KUB. Will check CT renal colic next year to assess both stone burden as well as to surveil AML.   . Leukocytoclastic vasculitis ([place]) 10/01/2017  . Localized edema 01/04/2018  . Lumbar radicular pain 01/19/2016  . Maculopapular rash 09/03/2017  . Nephrolithiasis 07/23/2015   Overview:  x 3  Last Assessment & Plan:  By [place] has left nephrolithiasis, but not visible by KUB. Will check CT renal colic next year to assess both stone burden as well as to surveil AML.   Overview:  x 3  Last Assessment & Plan:  Has 11m nonobstructing LUP stone - not visible by KUB.  Will check renal [place]/2019 - he will contact office  sooner if symptomatic.   . Non-sustained ventricular tachycardia ([place] 03/29/2017  . Other hyperlipidemia 03/29/2017  . Palpitations 10/01/2017  . Paroxysmal atrial fibrillation ([place] 03/29/2017  . Paroxysmal atrial fibrillation ([place] 03/29/2017  . Pleural effusion, bilateral 08/09/2017  . Post-nasal drainage 02/25/2016   Last Assessment & Plan:  Trial zyrtec and flonase  . Prostate cancer screening 06/20/2017   Last Assessment & Plan:  Recommend continued annual CaP screening until within 10 years of life expectancy. Given good health and fhx of longevity, would anticipate CaP screening to continue until age 73  PSA today and again in one year on day of visit.  . Pulmonary hypertension ([place] 08/09/2017  . Pulmonary nodules 06/12/2016  . S/P AVR (aortic valve replacement) 03/20/2016  . Supratherapeutic INR 07/26/2017  . Syncope 03/29/2017  . Syncope and collapse 02/01/2016  . Typical atrial flutter ([place] 02/01/2016   Past Surgical History:  Procedure Laterality Date  . CHOLECYSTECTOMY    . CORONARY ARTERY BYPASS GRAFT    . EXPLORATORY LAPAROTOMY  07/30/2017  . FOOT SURGERY    . FRACTURE SURGERY Right    wrist and forearm  .  HERNIA REPAIR    . MECHANICAL AORTIC VALVE REPLACEMENT    . NASAL SINUS SURGERY    . RIGHT HEART CATH N/A 01/18/2018   Procedure: RIGHT HEART CATH;  Surgeon: Jolaine Artist, MD;  Location: [place] [facility];  Service: Cardiovascular;  Laterality: N/A;  . RIGHT HEART CATH N/A 05/14/2018   Procedure: RIGHT HEART CATH;  Surgeon: Jolaine Artist, MD;  Location: [place] [facility];  Service: Cardiovascular;  Laterality: N/A;  . TEE WITHOUT CARDIOVERSION N/A 05/21/2018   Procedure: TRANSESOPHAGEAL ECHOCARDIOGRAM (TEE);  Surgeon: Jolaine Artist, MD;  Location: [facility] ENDOSCOPY;  Service: Cardiovascular;  Laterality: N/A;  . UPPER GASTROINTESTINAL ENDOSCOPY  07/12/2017   Patchy areas of mucosal inflammation noted in the antrum with edema,erthema and ulcerations. Bx. Chronicfocally  active gastritis.  [name] VASECTOMY       Current Outpatient Medications  Medication Sig Dispense Refill  . acetaminophen (TYLENOL) 650 MG CR tablet Take 1 tablet (650 mg total) by mouth every 8 (eight) hours as needed for pain. Not to use with norco since combined with tylenol. 30 tablet 0  . albuterol (PROVENTIL HFA;VENTOLIN HFA) 108 (90 Base) MCG/ACT inhaler Inhale 2 puffs into the lungs as needed for wheezing or shortness of breath.    [name] aspirin EC 81 MG tablet Take 81 mg by mouth daily.    . B Complex-C (B-COMPLEX WITH VITAMIN C) tablet Take 1 tablet by mouth daily.     . cetirizine (ZYRTEC ALLERGY) 10 MG tablet Take 10 mg by mouth daily as needed for allergies.     . Cholecalciferol (VITAMIN D) 125 MCG (5000 UT) CAPS Take 1 capsule by mouth daily.    . Coenzyme Q10 (COQ10 PO) Take 1 tablet by mouth daily.    . macitentan (OPSUMIT) 10 MG tablet Take 1 tablet (10 mg total) by mouth daily. 30 tablet 0  . Magnesium Gluconate (MAGNESIUM 27 PO) Take 1 tablet by mouth daily.    . midodrine (PROAMATINE) 10 MG tablet Take 1 tablet (10 mg total) by mouth 3 (three) times daily with meals. 90 tablet 6  . multivitamin-lutein (OCUVITE-LUTEIN) CAPS capsule Take 1 capsule by mouth daily.    . potassium chloride (K-DUR) 10 MEQ tablet Take 2 tablets (20 mEq total) by mouth daily. 184 tablet 2  . rosuvastatin (CRESTOR) 5 MG tablet Take 1 tablet (5 mg total) by mouth daily. 30 tablet 6  . spironolactone (ALDACTONE) 25 MG tablet TAKE 1/2 OF A TABLET BY MOUTH ONCE DAILY. 45 tablet 3  . tadalafil, PAH, (ADCIRCA) 20 MG tablet TAKE 2 TABLETS BY MOUTH ONCE A DAY 60 tablet 12  . torsemide (DEMADEX) 20 MG tablet Take 1 tablet (20 mg total) by mouth 2 (two) times daily as needed (will take 3 times a day if needed). 90 tablet 3  . warfarin (COUMADIN) 5 MG tablet As directed by coumadin clinic (Patient taking differently: daily. As directed by coumadin clinic) 30 tablet 3   No current facility-administered medications  for this encounter.     Allergies:   Protonix [pantoprazole sodium]; Amoxicillin-pot clavulanate; and Tape   Social History:  The patient  reports that he quit smoking about 18 years ago. His smoking use included cigarettes. He has a 68.00 pack-year smoking history. He has never used smokeless tobacco. He reports current alcohol use. He reports that he does not use drugs.   Family History:  The patient's family history includes Arthritis in his mother; Asthma in his mother; Heart attack  in his father; Hypertension in his father; Stroke in his paternal grandmother.   ROS:  Please see the history of present illness.   All other systems are personally reviewed and negative.  Today's Vitals   05/29/19 1346  BP: 122/70  Pulse: 79  SpO2: 97%  Weight: 66.2 kg (146 lb)   Body mass index is 21.25 kg/m.  Exam:  Tele Health Call; Exam is subjective  General:  Speaks in full sentences. No resp difficulty. Lungs: Normal respiratory effort with conversation.  Abdomen: Non-distended per patient report Extremities: He reports lower extremity edema.  Neuro: Alert & oriented x 3.   Recent Labs: 10/02/2018: B Natriuretic Peptide 206.0 01/01/2019: ALT 13; BUN 29; Creatinine, Ser 1.15; Hemoglobin 13.6; Platelets 302.0; Potassium 4.2; Sodium 136  Personally reviewed   Wt Readings from Last 3 Encounters:  05/29/19 66.2 kg (146 lb)  05/21/19 68.5 kg (151 lb)  03/27/19 66.2 kg (146 lb)     ASSESSMENT AND PLAN:  1. Pulmonary hypertension with cor pulmonale/RV failure - WHO Group I  - ? Component of HHT/shunt/AVMs with late bubbles on bubble study.  - Echo with bubble study 05/16/18 LVEF 55-60% with "late bubbles" , Mechanical AVR stable with trivial perivalvular regurg, Mild MR, Severe LAE, Severe RV dilation and reduced function. No PFO. Mod TR, PA peak pressure 68 mm Hg - Echo 10/19: EF 55-60% mild LVH; s/p AVR with mean gradient 11 mmHg and trace AI; mild MR; severe biatrial enlargement; mild RVE;  severe TR; severe pulmonary hypertension. RVSP 90mHG - RHC 1/19 with mod/severe pulm HTN with RV failure.  - RHC 05/14/18 with Mild/Moderate PAH in setting of high output with no evidence of intracardiac shunting. - Ab u/s with cirrhosis but no evidence of portal HTN - Improved with Macitentan and Adcirca. -Did not tolerate selexipag 200 bid due to calf cramping. Will continue 200 daily. Can retitrate again down the road. - NYHA II-III. Volume status sound elevated. Continue torsemide 60 mg daily and he will take an extra 20 mg of torsemide for 2 days.  - Continue spiro 12.5 mg daily..  - Continue midodrine 10 mg am and afternoon, 15 mg at night - Auto-immune serologies negative (checked twice) - Follow up next week to reassess volume and check labs. May need RHC if fatigue persists/dyspnea.   2. Chronic respiratory failure - [place] CT 5/23/19with "dependent basilar predominant patchy subpleural reticulation and ground-glass attenuation with the suggestion of minimal associated traction bronchiolectasis. No frank honeycombing. Findings may indicate an interstitial lung disease such as early usual interstitial pneumonia (UIP) or fibrotic phase nonspecific interstitial pneumonia (NSIP)." - Followed by Dr. BLamonte Sakai  3. Chronic AFL Rate controlled.  Now off metoprolol. Continue coumadin with mechanical AVR. No bleeding issues.   4. CAD - s/p CABG 07/2017 with Dr [place]oderic Palauin [place]  - No chest pain.  - continue ASA.  - Unable to tolerate atorva due to myalgias.  - Continue Crestor 537mdaily  5. S/p mechanical AVR - stable on most recent echo. Continue coumadin/ASA 81. INR goal is 2.5-3 per Dr MuBettina Gavia- aware of need for SBE prophylaxis  6. Leukocytoclastic vasculitis - f/u with Rheumatology. No change.  7. Cirrhosis - USKoreabdomen RUQ 05/16/18 - s/p cholecystectomy. + hepatic cirrhosis. Mild ascites.  - Liver Doppler 05/16/18 - No hepatic, splenic, or portal venous thrombosis or  occlusion. Mild ascites. - Continue midodrine 10 mg am, afternoon, 15 mg HS    COVID screen The patient does  not have any symptoms that suggest any further testing/ screening at this time.  Social distancing reinforced today.  Patient Risk: After full review of this patients clinical status, I feel that they are at moderate risk for cardiac decompensation at this time.  Relevant cardiac medications were reviewed at length with the patient today. The patient does not have concerns regarding their medications at this time.   The following changes were made today:  Continue torsemide 60 mg daily and take extra 20 mg torsemide for 2 days.  Recommended follow-up:  Next week with DB and full set up labs. May need [place] set up.   Today, I have spent 20 minutes with the patient with telehealth technology discussing the above issues .    Jeanmarie Hubert, NP  05/29/2019 1:49 PM  [place] [address] [facility] [place] [address] [phone] (office) [phone] (fax)

## 2019-05-29 NOTE — Telephone Encounter (Signed)
1. COVID-19 Pre-Screening Questions:  . In the past 7 to 10 days have you had a cough,  shortness of breath, headache, congestion, fever (100 or greater) body aches, chills, sore throat, or sudden loss of taste or sense of smell? NO . Have you been around anyone with known Covid 19.  NO . Have you been around anyone who is awaiting Covid 19 test results in the past 7 to 10 days?  NO . Have you been around anyone who has been exposed to Covid 19, or has mentioned symptoms of Covid 19 within the past 7 to 10 days?  NO    2. Pt advised of visitor restrictions (no visitors allowed except if needed to conduct the visit). Also advised to arrive at appointment time and wear a mask.

## 2019-05-30 NOTE — Telephone Encounter (Signed)
LMOM for patient to call me back regarding medication possibliy giving him a rash last time.

## 2019-06-02 NOTE — Telephone Encounter (Signed)
LMOM for patient to return call.

## 2019-06-02 NOTE — Telephone Encounter (Signed)
Patient says that Pantoprazole and Omeprazole have both made him itch. Patient wants to make sure that the Aciphex is safe to take.

## 2019-06-03 ENCOUNTER — Ambulatory Visit (INDEPENDENT_AMBULATORY_CARE_PROVIDER_SITE_OTHER): Payer: Medicare Other | Admitting: *Deleted

## 2019-06-03 ENCOUNTER — Other Ambulatory Visit: Payer: Self-pay

## 2019-06-03 DIAGNOSIS — Z7901 Long term (current) use of anticoagulants: Secondary | ICD-10-CM | POA: Diagnosis not present

## 2019-06-03 DIAGNOSIS — I4892 Unspecified atrial flutter: Secondary | ICD-10-CM

## 2019-06-03 DIAGNOSIS — Z5181 Encounter for therapeutic drug level monitoring: Secondary | ICD-10-CM | POA: Diagnosis not present

## 2019-06-03 LAB — POCT INR: INR: 2.6 (ref 2.0–3.0)

## 2019-06-03 MED ORDER — RABEPRAZOLE SODIUM 20 MG PO TBEC: 20.0000 mg | DELAYED_RELEASE_TABLET | Freq: Every day | ORAL | 11 refills | Status: DC

## 2019-06-03 NOTE — Patient Instructions (Signed)
Description   Continue 2.70m daily except 569mon Sundays and Thursdays.  Please call our office with any medication changes or concerns [phone]. Return in 4 weeks for INR check.

## 2019-06-03 NOTE — Addendum Note (Signed)
Addended by: Karena Addison on: 06/03/2019 04:00 PM   Modules accepted: Orders

## 2019-06-03 NOTE — Telephone Encounter (Signed)
Lets try AcipHex 20 mg p.o. once a day. Hopefully, this will be better and will not cause any itching.

## 2019-06-03 NOTE — Telephone Encounter (Signed)
Sent prescription to pharmacy.

## 2019-06-06 ENCOUNTER — Ambulatory Visit (HOSPITAL_COMMUNITY)
Admission: RE | Admit: 2019-06-06 | Discharge: 2019-06-06 | Disposition: A | Discharge status: Home / Self Care | Payer: Medicare Other | Source: Ambulatory Visit | Attending: Internal Medicine | Admitting: Internal Medicine

## 2019-06-06 ENCOUNTER — Other Ambulatory Visit: Payer: Self-pay

## 2019-06-06 ENCOUNTER — Encounter (HOSPITAL_COMMUNITY): Payer: Self-pay | Admitting: Internal Medicine

## 2019-06-06 VITALS — BP 124/86 | HR 78 | Wt 150.2 lb

## 2019-06-06 DIAGNOSIS — I4821 Permanent atrial fibrillation: Secondary | ICD-10-CM | POA: Insufficient documentation

## 2019-06-06 DIAGNOSIS — Z8249 Family history of ischemic heart disease and other diseases of the circulatory system: Secondary | ICD-10-CM | POA: Insufficient documentation

## 2019-06-06 DIAGNOSIS — Z952 Presence of prosthetic heart valve: Secondary | ICD-10-CM | POA: Diagnosis not present

## 2019-06-06 DIAGNOSIS — Z87442 Personal history of urinary calculi: Secondary | ICD-10-CM | POA: Diagnosis not present

## 2019-06-06 DIAGNOSIS — Z79899 Other long term (current) drug therapy: Secondary | ICD-10-CM | POA: Insufficient documentation

## 2019-06-06 DIAGNOSIS — I5032 Chronic diastolic (congestive) heart failure: Secondary | ICD-10-CM

## 2019-06-06 DIAGNOSIS — Z8261 Family history of arthritis: Secondary | ICD-10-CM | POA: Diagnosis not present

## 2019-06-06 DIAGNOSIS — I2721 Secondary pulmonary arterial hypertension: Secondary | ICD-10-CM

## 2019-06-06 DIAGNOSIS — I4892 Unspecified atrial flutter: Secondary | ICD-10-CM | POA: Diagnosis not present

## 2019-06-06 DIAGNOSIS — I2510 Atherosclerotic heart disease of native coronary artery without angina pectoris: Secondary | ICD-10-CM | POA: Insufficient documentation

## 2019-06-06 DIAGNOSIS — Z87891 Personal history of nicotine dependence: Secondary | ICD-10-CM | POA: Insufficient documentation

## 2019-06-06 DIAGNOSIS — Z7982 Long term (current) use of aspirin: Secondary | ICD-10-CM | POA: Insufficient documentation

## 2019-06-06 DIAGNOSIS — R1011 Right upper quadrant pain: Secondary | ICD-10-CM

## 2019-06-06 DIAGNOSIS — Z951 Presence of aortocoronary bypass graft: Secondary | ICD-10-CM | POA: Diagnosis not present

## 2019-06-06 DIAGNOSIS — I480 Paroxysmal atrial fibrillation: Secondary | ICD-10-CM | POA: Diagnosis not present

## 2019-06-06 DIAGNOSIS — J9610 Chronic respiratory failure, unspecified whether with hypoxia or hypercapnia: Secondary | ICD-10-CM | POA: Insufficient documentation

## 2019-06-06 DIAGNOSIS — I2781 Cor pulmonale (chronic): Secondary | ICD-10-CM | POA: Diagnosis not present

## 2019-06-06 DIAGNOSIS — Z888 Allergy status to other drugs, medicaments and biological substances status: Secondary | ICD-10-CM | POA: Insufficient documentation

## 2019-06-06 DIAGNOSIS — Z7901 Long term (current) use of anticoagulants: Secondary | ICD-10-CM | POA: Insufficient documentation

## 2019-06-06 DIAGNOSIS — I110 Hypertensive heart disease with heart failure: Secondary | ICD-10-CM | POA: Insufficient documentation

## 2019-06-06 DIAGNOSIS — Z825 Family history of asthma and other chronic lower respiratory diseases: Secondary | ICD-10-CM | POA: Diagnosis not present

## 2019-06-06 DIAGNOSIS — J449 Chronic obstructive pulmonary disease, unspecified: Secondary | ICD-10-CM | POA: Diagnosis not present

## 2019-06-06 DIAGNOSIS — I272 Pulmonary hypertension, unspecified: Secondary | ICD-10-CM

## 2019-06-06 DIAGNOSIS — Z823 Family history of stroke: Secondary | ICD-10-CM | POA: Diagnosis not present

## 2019-06-06 DIAGNOSIS — Z9049 Acquired absence of other specified parts of digestive tract: Secondary | ICD-10-CM | POA: Diagnosis not present

## 2019-06-06 DIAGNOSIS — K7460 Unspecified cirrhosis of liver: Secondary | ICD-10-CM | POA: Diagnosis not present

## 2019-06-06 DIAGNOSIS — Z880 Allergy status to penicillin: Secondary | ICD-10-CM | POA: Insufficient documentation

## 2019-06-06 LAB — CBC
HCT: 30.1 % — ABNORMAL LOW (ref 39.0–52.0)
Hemoglobin: 8.9 g/dL — ABNORMAL LOW (ref 13.0–17.0)
MCH: 20.4 pg — ABNORMAL LOW (ref 26.0–34.0)
MCHC: 29.6 g/dL — ABNORMAL LOW (ref 30.0–36.0)
MCV: 68.9 fL — ABNORMAL LOW (ref 80.0–100.0)
Platelets: 289 10*3/uL (ref 150–400)
RBC: 4.37 MIL/uL (ref 4.22–5.81)
RDW: 21.8 % — ABNORMAL HIGH (ref 11.5–15.5)
WBC: 6.4 10*3/uL (ref 4.0–10.5)
nRBC: 0 % (ref 0.0–0.2)

## 2019-06-06 LAB — COMPREHENSIVE METABOLIC PANEL
ALT: 16 U/L (ref 0–44)
AST: 32 U/L (ref 15–41)
Albumin: 4.1 g/dL (ref 3.5–5.0)
Alkaline Phosphatase: 69 U/L (ref 38–126)
Anion gap: 11 (ref 5–15)
BUN: 27 mg/dL — ABNORMAL HIGH (ref 8–23)
CO2: 22 mmol/L (ref 22–32)
Calcium: 9.2 mg/dL (ref 8.9–10.3)
Chloride: 102 mmol/L (ref 98–111)
Creatinine, Ser: 1.34 mg/dL — ABNORMAL HIGH (ref 0.61–1.24)
GFR calc Af Amer: >60 mL/min (ref >60)
GFR calc non Af Amer: 53 mL/min — ABNORMAL LOW (ref >60)
Glucose, Bld: 81 mg/dL (ref 70–99)
Potassium: 3.4 mmol/L — ABNORMAL LOW (ref 3.5–5.1)
Sodium: 135 mmol/L (ref 135–145)
Total Bilirubin: 2.1 mg/dL — ABNORMAL HIGH (ref 0.3–1.2)
Total Protein: 8.5 g/dL — ABNORMAL HIGH (ref 6.5–8.1)

## 2019-06-06 LAB — BRAIN NATRIURETIC PEPTIDE: B Natriuretic Peptide: 618.3 pg/mL — ABNORMAL HIGH (ref 0.0–100.0)

## 2019-06-06 MED ORDER — TORSEMIDE 20 MG PO TABS: 60.0000 mg | ORAL_TABLET | Freq: Every day | ORAL | 3 refills | Status: DC

## 2019-06-06 NOTE — Addendum Note (Signed)
Encounter addended by: Harvie Junior, CMA on: 06/06/2019 2:48 PM  Actions taken: Visit diagnoses modified, Order list changed, Diagnosis association updated

## 2019-06-06 NOTE — Addendum Note (Signed)
Encounter addended by: Harvie Junior, CMA on: 06/06/2019 2:42 PM  Actions taken: Charge Capture section accepted

## 2019-06-06 NOTE — Patient Instructions (Signed)
Lab work today  Increase torsemide to 33m daily.   Your provider request that you have a chest X-Ray and a RUQ ultrasound today.   Follow up in four weeks

## 2019-06-06 NOTE — Addendum Note (Signed)
Encounter addended by: Shonna Chock, CMA on: 4/70/7615 2:37 PM  Actions taken: Pharmacy for encounter modified, Visit diagnoses modified, Order list changed, Diagnosis association updated, Clinical Note Signed

## 2019-06-06 NOTE — Progress Notes (Signed)
ADVANCED HF CLINIC NOTE   Date:  06/06/2019   ID:  Lance Hicks, DOB 1946/03/16, MRN [id number]  PCP:  [name], PA-C  Cardiologist:  Dr. 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 Also has COPD (quit smoking in 2002 - 1 ppd x 25 years), HL, and HTN.  In 8/18 admitted for acute pancreatitis. Underwent lap cholecystectomy on 07/29/2017. On 8/6 hedeveloped hypotension and was taken back for open laparotomy due to bleeding from an unclear omental source.He was given fluids and 8 units PRBC perioperatively. He says his breathing has been bad since that time.   Seen at [facility] in 2018 had hi-res CT, PFTs and VQ (low prob). Felt to have Golds I COPD with asthma overlap and early pulmonary fibrosis with PAH. Thought felt pulmonary pressures may have been elevated due to recent illness. Referred to Rheumatology though appt not until May. Suggested possible RHC.   He was also seen by Cardiology. Echocardiogram 8/18 as below showed normal LV function with septal flattening and RVSP 65-3mHG. There was no comment on RV size or function. 14-day event monitor showed 10-beat run NSVT. Felt to have chronic AF and PAH. No further testing ordered.  Underwent RHC on 01/18/18 with severe PAH and cor pulmonale. Macitentan and Adcirca.   Admitted 5/21-5/30/19 with symptomatic hypotension and weight gain. He had a repeat RHC and echo with bubble study (results below). He was started on midodrine for BP support. Diuresed 25 lbs with IV lasix, then transitioned to torsemide 60 mg daily. Abdominal [place]owed cirrhosis with no evidence of portal HTN. Pulm consulted for possible ILD on High-res chest CT. Ortho consulted for right rotator cuff tear from horse  accident PTA. He was treated with antibiotics for Bacteremia. DC weight: 140 lbs.   He presents today for regular follow up. Remains on Adcirca and macitentan. He was unable to tolerate Uptravi due to diarrhea. Says he feels worse because the gym is closed and he can't go. Weight up to 147. Feels better 141-142. Feels like PPI is making him hold onto fluid. Recently increased torsemide for 40 daily to 60 daily for 3 days and now back to 40 daily. Weight 158 -> 146. Having pain and cramps in RUQ  Review of systems complete and found to be negative unless listed in HPI.   Studies:  RHC 5/21/19showed Mild/Moderate PAH in setting of high output with no evidence of intracardiac shunting. Hemodynamics as below.   High-Res Chest CT 05/16/18 1. Slightly irregular 0.9 cm peripheral right middle lobe solid pulmonary nodule. 3 month follow up recommended.  2. Dependent basilar predominant patchy subpleural reticulation and ground-glass attenuation with the suggestion of minimal associated traction bronchiolectasis. No frank honeycombing. Findings may indicate an interstitial lung disease such as early usual interstitial pneumonia (UIP) or fibrotic phase nonspecific interstitial pneumonia (NSIP). Suggest a follow-up high-resolution chest CT study in 6-12 months to assess temporal pattern stability, as clinically warranted. 3. Mild cardiomegaly. Minimal interlobular septal thickening and trace dependent bilateral pleural effusions.  4. Small to moderate volume perihepatic ascites. 5. Ectatic 4.3 cm ascending thoracic aorta. Recommend annual imaging followup by CTA or MRA.  6. Left main and 3 vessel coronary atherosclerosis.  US Abdomen RUQ 05/16/18 - s/p cholecystectomy. + hepatic cirrhosis. Mild ascites.   Liver Doppler 05/16/18 - No hepatic, splenic, or portal venous thrombosis or occlusion. Mild ascites.  Echo with bubble study 05/16/18 LVEF 55-60% with "late bubbles" , Mechanical AVR stable  with trivial perivalvular regurg, Mild MR, Severe LAE, Severe RV dilation and reduced function. No PFO. Mod TR, PA peak pressure 68 mm Hg  [place] numbers5/24/19: PA 60-19 (32) CVP ~10 CO 5.4 CI 2.9  RHC 05/14/18 RA = 18 RV = 71/17 PA = 69/24 (39) PCW = 20 Fick cardiac output/index = 7.0/3.7 Thermo CO/CI = 7.2/3.8 PVR = 2.6 WU Ao sat = 97% PA sat = 73%, 73% SVC sat = 73%  RA sat = 72%  RHC 01/18/18 RA = 23 RV = 84/21 PA = 81/38 (53) PCW = 22 Fick cardiac output/index = 3.0/1.6 PVR = 10.4 WU FA sat = 98% on 2L  (checked on RA = 93%) PA sat = 60%, 61% SVC sat = 62%   Echo 8/18  1. LVEF is estimated to be 55%. 2. There is septal flattening consistent with right ventricular pressure and or volume overload. 3. A mechanical prosthetic aortic valve is present with mild AI 4. There is moderate to severe tricuspid regurgitation. 5. There is a trivial amount of pulmonic regurgitation. 6. The right ventricular systolic pressure is calculated at 65-75mHg. 7. The inferior vena cava is dilated with <50% inspiratory collapse, suggestive of an elevated right atrial pressure (142mg).  14 Day Patch 10/01/2017-10/15/2017:  -1 run of Ventricular Tachycardia occurred lasting 10 beats with a max rate of 240 bpm (avg 161 bpm). -Atrial Flutter occurred continuously (100% burden), ranging from 55-132 bpm (avg of 87 bpm).   NM Ventilation and Perfusion Lung Scan 10/31/2017:  Low probability of pulmonary embolism, using the PIOPED criteria.  Lower extremity venous duplex 07/25/2017:  1. Low probability of acute deep vein thrombosis in bilateral lower extremities. 2. Calf veins are poorly visualized but appear patent in limited segments visualized.   PFTs 07/02/2017:  FVC86% FEV185% FEV1/FVC 73% DLCO8.2, 32%   6MW -1400 feet; 97% to 85% (recovered with resting)   CT Chest07/08/2017: 1. Stable right middle lobe pulmonary  nodule measuring up to 1 cm. 2. Minimal subpleural reticulation without evidence of honeycombing to  suggest pulmonary fibrosis. Stable centrilobular emphysema. 3. Mediastinal lymphadenopathy, increased from prior. 4. Calcified right hilar lymph nodes and multiple calcified granulomas  throughout the right lung consistent with prior granulomatous disease. 5. Ascending thoracic aortic aneurysm measuring up to 4.5 cm, unchanged. 6. Dilatation of the main pulmonary artery suggestive of pulmonary  hypertension. 7. Cardiomegaly and dense atherosclerotic coronary artery calcifications.    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 In light of concurrent left nephrolithiasis, will check CT renal colic next year instead of renal [place]   Anticoagulated on Coumadin 01/04/2018   Anxiety 05/10/2016   Last Assessment & Plan:  Doing well off of zoloft.   Asymptomatic microscopic hematuria 06/20/2017   Last Assessment & Plan:  [facility] and [facility]. UA with 2+ blood last visit - we discussed recommendation for repeat workup at 5 years or if degree of hematuria progresses.    Atrial fibrillation ([place]/04/2017   Atrial flutter ([place]4/04/2017   Chronic allergic rhinitis 04/28/2016   Last Assessment & Plan:  Continue astelin   Chronic anticoagulation 03/29/2017   Chronic atrial fibrillation 07/23/2015   Last Assessment & Plan:  Coumadin and metoprolol, cardiology referral to establish care.   Chronic midline back pain 12/14/2015   Last Assessment & Plan:  Pain management referral for further evaluation.   Chronic prostatitis 07/23/2015   Last Assessment & Plan:  Has largely resolved since stopping bike riding. Recommend annual DRE AND PSA - will see back 12/2015 for annual screening, given 1st degree fhx. To call office for recurrent prostatitis symptoms.     COPD (chronic obstructive pulmonary disease) ([place]) 06/12/2016   Coronary artery disease involving native coronary artery of native heart with angina pectoris ([place]) 03/29/2017   Cough 10/17/2016   Last Assessment & Plan:  Discussed typical course for acute viral illness. If symptoms worsen or fail to improve by 7-10d, delayed ATBs, fluids, rest, NSAIDs/APAP prn. Seek care if not improving. Needs earlier INR check due to ATBs.   Dyspnea 02/01/2016   Last Assessment & Plan:  Overall improving, eval by pulm, plan for CT, neg stress test with cardiology. Recent switch to carvedilol due to side effects.   Epidermoid cyst of skin 08/24/2017   Essential hypertension 12/14/2015   Last Assessment & Plan:  Hypertension control: controlled  Medications: compliant Medication Management: as noted in orders Home blood pressure monitoring recommended additionally as needed for symptoms  The patient's care plan was reviewed and updated. Instructions and counseling were provided regarding patient goals and barriers. He was counseled to adopt a healthy lifestyle. Educational resources and self-management tools have been provided as charted in [facility] list.    H/O maze procedure 03/29/2017   [name] 03/29/2017   Overview:  2011   Hx of CABG 03/29/2017   Hyperlipidemia 03/29/2017   Hypertensive heart disease 03/29/2017   Kidney stones 07/23/2015   Overview:  x 3  Last Assessment & Plan:  By [place] has left nephrolithiasis, but not visible by KUB. Will check CT renal colic next year to assess both stone burden as well as to surveil AML.    Leukocytoclastic vasculitis ([place]) 10/01/2017   Localized edema 01/04/2018   Lumbar radicular pain 01/19/2016   Maculopapular rash 09/03/2017   Nephrolithiasis 07/23/2015   Overview:  x 3  Last Assessment & Plan:  By [place] has left nephrolithiasis, but not visible by KUB. Will check CT renal colic next year to assess both stone burden as well as to surveil AML.    Overview:  x 3  Last Assessment & Plan:  Has 55m nonobstructing LUP stone - not visible by KUB.  Will check renal [place]8/2019 - he will contact office sooner if symptomatic.    Non-sustained ventricular tachycardia ([place] 03/29/2017   Other hyperlipidemia 03/29/2017   Palpitations 10/01/2017   Paroxysmal atrial fibrillation (HCC) 03/29/2017   Paroxysmal atrial fibrillation ([place] 03/29/2017   Pleural effusion, bilateral 08/09/2017   Post-nasal drainage 02/25/2016   Last Assessment & Plan:  Trial zyrtec and flonase   Prostate cancer screening 06/20/2017   Last Assessment & Plan:  Recommend continued annual CaP screening until within 10 years of life expectancy. Given good health and fhx of longevity, would anticipate CaP screening to continue until age [address]  PSA today and again in one year on day of visit.   Pulmonary hypertension ([place] 08/09/2017   Pulmonary nodules 06/12/2016   S/P AVR (aortic valve replacement) 03/20/2016   Supratherapeutic INR 07/26/2017   Syncope 03/29/2017   Syncope and  collapse 02/01/2016   Typical atrial flutter ([place]) 02/01/2016    Past Surgical History:  Procedure Laterality Date   CHOLECYSTECTOMY     CORONARY ARTERY BYPASS GRAFT     EXPLORATORY LAPAROTOMY  07/30/2017   FOOT SURGERY     FRACTURE SURGERY Right    wrist and forearm   HERNIA REPAIR     MECHANICAL AORTIC VALVE REPLACEMENT     NASAL SINUS SURGERY     RIGHT HEART CATH N/A 01/18/2018   Procedure: RIGHT HEART CATH;  Surgeon: Jolaine Artist, MD;  Location: [facility];  Service: Cardiovascular;  Laterality: N/A;   RIGHT HEART CATH N/A 05/14/2018   Procedure: RIGHT HEART CATH;  Surgeon: Jolaine Artist, MD;  Location: [facility];  Service: Cardiovascular;  Laterality: N/A;   TEE WITHOUT CARDIOVERSION N/A 05/21/2018   Procedure: TRANSESOPHAGEAL ECHOCARDIOGRAM (TEE);  Surgeon: Jolaine Artist, MD;  Location: [facility] ENDOSCOPY;  Service: Cardiovascular;  Laterality: N/A;   UPPER  GASTROINTESTINAL ENDOSCOPY  07/12/2017   Patchy areas of mucosal inflammation noted in the antrum with edema,erthema and ulcerations. Bx. Chronicfocally active gastritis.   VASECTOMY      Current Medications: Current Meds  Medication Sig   acetaminophen (TYLENOL) 650 MG CR tablet Take 1 tablet (650 mg total) by mouth every 8 (eight) hours as needed for pain. Not to use with norco since combined with tylenol.   albuterol (PROVENTIL HFA;VENTOLIN HFA) 108 (90 Base) MCG/ACT inhaler Inhale 2 puffs into the lungs as needed for wheezing or shortness of breath.   aspirin EC 81 MG tablet Take 81 mg by mouth daily.   B Complex-C (B-COMPLEX WITH VITAMIN C) tablet Take 1 tablet by mouth daily.    cetirizine (ZYRTEC ALLERGY) 10 MG tablet Take 10 mg by mouth daily as needed for allergies.    Cholecalciferol (VITAMIN D) 125 MCG (5000 UT) CAPS Take 1 capsule by mouth daily.   Coenzyme Q10 (COQ10 PO) Take 1 tablet by mouth daily.   macitentan (OPSUMIT) 10 MG tablet Take 1 tablet (10 mg total) by mouth daily.   Magnesium Gluconate (MAGNESIUM 27 PO) Take 1 tablet by mouth daily.   midodrine (PROAMATINE) 10 MG tablet Take 1 tablet (10 mg total) by mouth 3 (three) times daily with meals.   multivitamin-lutein (OCUVITE-LUTEIN) CAPS capsule Take 1 capsule by mouth daily.   potassium chloride (K-DUR) 10 MEQ tablet Take 2 tablets (20 mEq total) by mouth daily.   rosuvastatin (CRESTOR) 5 MG tablet Take 1 tablet (5 mg total) by mouth daily.   spironolactone (ALDACTONE) 25 MG tablet TAKE 1/2 OF A TABLET BY MOUTH ONCE DAILY.   tadalafil, PAH, (ADCIRCA) 20 MG tablet TAKE 2 TABLETS BY MOUTH ONCE A DAY   torsemide (DEMADEX) 20 MG tablet Take 1 tablet (20 mg total) by mouth 2 (two) times daily as needed (will take 3 times a day if needed). (Patient taking differently: Take 60 mg by mouth daily. )   warfarin (COUMADIN) 5 MG tablet As directed by coumadin clinic (Patient taking differently: daily. As directed  by coumadin clinic)     Allergies:   Protonix [pantoprazole sodium], Amoxicillin-pot clavulanate, and Tape   Social History   Socioeconomic History   Marital status: Single    Spouse name: Not on file   Number of children: Not on file   Years of education: Not on file   Highest education level: Not on file  Occupational History   Not on file  Social  Needs   Financial resource strain: Not on file   Food insecurity    Worry: Not on file    Inability: Not on file   Transportation needs    Medical: Not on file    Non-medical: Not on file  Tobacco Use   Smoking status: Former Smoker    Packs/day: 2.00    Years: 34.00    Pack years: 68.00    Types: Cigarettes    Quit date: 07/16/2000    Years since quitting: 18.9   Smokeless tobacco: Never Used  Substance and Sexual Activity   Alcohol use: Yes   Drug use: No   Sexual activity: Not on file  Lifestyle   Physical activity    Days per week: Not on file    Minutes per session: Not on file   Stress: Not on file  Relationships   Social connections    Talks on phone: Not on file    Gets together: Not on file    Attends religious service: Not on file    Active member of club or organization: Not on file    Attends meetings of clubs or organizations: Not on file    Relationship status: Not on file  Other Topics Concern   Not on file  Social History Narrative   Not on file     Family History: The patient's family history includes Arthritis in his mother; Asthma in his mother; Heart attack in his father; Hypertension in his father; Stroke in his paternal grandmother. There is no history of Colon cancer.   Recent Labs: 10/02/2018: B Natriuretic Peptide 206.0 01/01/2019: ALT 13; BUN 29; Creatinine, Ser 1.15; Hemoglobin 13.6; Platelets 302.0; Potassium 4.2; Sodium 136  Recent Lipid Panel    Component Value Date/Time   CHOL 125 10/21/2018 1028   TRIG 57 10/21/2018 1028   HDL 43 10/21/2018 1028   CHOLHDL 2.9  10/21/2018 1028   VLDL 11 10/21/2018 1028   LDLCALC 71 10/21/2018 1028    Physical Exam:    VS:  BP 124/86    Pulse 78    Wt 68.1 kg (150 lb 3.2 oz)    SpO2 97%    BMI 21.86 kg/m     Wt Readings from Last 3 Encounters:  06/06/19 68.1 kg (150 lb 3.2 oz)  05/29/19 66.2 kg (146 lb)  05/21/19 68.5 kg (151 lb)    General:  Sitting on table. No resp difficulty HEENT: normal x for mild scleral icterus Neck: supple. JVP 10. Carotids 2+ bilat; no bruits. No lymphadenopathy or thryomegaly appreciated. Cor: PMI nondisplaced. Irregular rate & rhythm. Mechanical s2 2/6 TR Lungs: clear but decreased 1/2 up on R Abdomen: soft, tender in RUQ, nondistended. Liver edge down 3-4 cm + tender No bruits or masses. Good bowel sounds. Extremities: no cyanosis, clubbing, rash, 1-2+ edema Neuro: alert & orientedx3, cranial nerves grossly intact. moves all 4 extremities w/o difficulty. Affect pleasant    ASSESSMENT/PLAN:    1. Pulmonary hypertension with cor pulmonale/RV failure - WHO Group I  -? Component ofHHT/shunt/AVMs with late bubbles on bubble study.  - Echo with bubble study 05/16/18 LVEF 55-60% with "late bubbles" , Mechanical AVR stable with trivial perivalvular regurg, Mild MR, Severe LAE, Severe RV dilation and reduced function. No PFO. Mod TR, PA peak pressure 68 mm Hg - Echo 10/19: EF 55-60% mild LVH; s/p AVR with mean gradient 11 mmHg and trace AI; mild MR; severe biatrial enlargement; mild RVE; severe TR; severe pulmonary hypertension. RVSP  42mHG - RHC 1/19 with mod/severe pulm HTN with RV failure.  - RHC 05/14/18 with Mild/Moderate PAH in setting of high output with no evidence of intracardiac shunting. - Ab u/s with cirrhosis but no evidence of portal HTN - Improved with Macitentan and Adcirca. Unable to tolerate Uptravi due to diarrhea - NYHA III. Volume status elevated. Increase torsemide to 60 daily - Continue spiro 12.5 mg daily. - Continue midodrine 10 mg am and afternoon, 15 mg  at night - Auto-immune serologies negative (checked twice) - Follow up next week to reassess volume and check labs. May need RHC if fatigue persists/dyspnea.   2. Chronic respiratory failure - [place] CT 5/23/19with "dependent basilar predominant patchy subpleural reticulation and ground-glass attenuation with the suggestion of minimal associated traction bronchiolectasis. No frank honeycombing. Findings may indicate an interstitial lung disease such as early usual interstitial pneumonia (UIP) or fibrotic phase nonspecific interstitial pneumonia (NSIP)." -Followed by Dr. BLamonte Sakai 3. Chronic AFL -Rate controlled.  Now off metoprolol. Continue coumadin with mechanical AVR. No bleeding issues.   4. CAD - s/[place] CAB[place] 07[place]2017 [place]  - No chest pain - continue ASA.  - Unable to tolerate atorva due to myalgias.  -ContinueCrestor 564mdaily  5. S/p mechanical AVR - stable on most recent echo. Continue coumadin/ASA 81. INR goal is 2.5-3 per Dr MuBettina Gavia- aware of need for SBE prophylaxis  6. Leukocytoclastic vasculitis - f/u with Rheumatology. No change.  7. Cirrhosis - USKoreabdomen RUQ 05/16/18 - s/p cholecystectomy. + hepatic cirrhosis. Mild ascites.  - Liver Doppler 05/16/18 - No hepatic, splenic, or portal venous thrombosis or occlusion. Mild ascites. - Continue midodrine 10 mg am, afternoon, 15 mg HS - RUQ pain today with scleral icterus. Concern for liver congestion. Increase diuretics. Check labs and RUQ u/s  8. Possible R pleural effusion on exam - CXR today. Increase torsemide   DaGlori BickersMD  2:13 PM

## 2019-06-10 ENCOUNTER — Ambulatory Visit (INDEPENDENT_AMBULATORY_CARE_PROVIDER_SITE_OTHER): Payer: Medicare Other | Admitting: Medical

## 2019-06-10 ENCOUNTER — Telehealth: Payer: Self-pay | Admitting: Medical

## 2019-06-10 ENCOUNTER — Telehealth (HOSPITAL_COMMUNITY): Payer: Self-pay

## 2019-06-10 ENCOUNTER — Encounter: Payer: Self-pay | Admitting: Medical

## 2019-06-10 VITALS — BP 111/71 | HR 58

## 2019-06-10 DIAGNOSIS — D649 Anemia, unspecified: Secondary | ICD-10-CM

## 2019-06-10 DIAGNOSIS — Z8719 Personal history of other diseases of the digestive system: Secondary | ICD-10-CM | POA: Diagnosis not present

## 2019-06-10 DIAGNOSIS — I2510 Atherosclerotic heart disease of native coronary artery without angina pectoris: Secondary | ICD-10-CM

## 2019-06-10 DIAGNOSIS — I5032 Chronic diastolic (congestive) heart failure: Secondary | ICD-10-CM

## 2019-06-10 MED ORDER — POTASSIUM CHLORIDE ER 20 MEQ PO TBCR: 40.0000 meq | EXTENDED_RELEASE_TABLET | Freq: Every day | ORAL | 2 refills | Status: DC

## 2019-06-10 NOTE — Telephone Encounter (Signed)
Pt states you requested results for his bp/pulse/oxygen level BP 111/71 Pulse/58 Oxygen level 100%

## 2019-06-10 NOTE — Progress Notes (Signed)
Subjective:    Patient ID: Lance Hicks, male    DOB: Apr 25, 1946, 73 y.o.   MRN: [id number]  HPI  Virtual Visit via Telephone Note  I connected with Lance Hicks on 06/10/19 at  2:20 PM EDT by telephone and verified that I am speaking with the correct person using two identifiers.  Location: Patient: home Provider: home.  Pt did not check his bp. 127/76 last checked 2 day.   I discussed the limitations, risks, security and privacy concerns of performing an evaluation and management service by telephone and the availability of in person appointments. I also discussed with the patient that there may be a patient responsible charge related to this service. The patient expressed understanding and agreed to proceed.   History of Present Illness: Pt had recent referred back to me for anemia. 4 days ago his hb was 8.9 and hct was 30.1. 5 months ago his hb was 13.6 and hb was 40.1. Pt inr was normal rang 2.6  Range 7 days ago. He not reporting any diffuse bruising, black or blood stools, and denies andy nose bleeds. Recent gfr 4 days ago showed gfr 53 and cr 1.34.   Pt has seen GI before in past below is some comments on A/P   #3. Rectal bleeding with constipation. Neg hemoccult today. Neg colon in [place]. Plan: - Check CBC, CMP, PT, ammonia and AFP. - Benefiber 1 tbs po qd - fluticasone cream 0.05% generic 30g 1 bid PR x 10 days, 1 refill - Wants to hold off on colon. He does understand risks and benefits including small but definite risks of missing colorectal neoplasms.  If he has any recurrent rectal bleeding, he would reconsider. He will need to be off Coumadin and will likely need Lovenox bridging. - FU in 12 weeks.     Observations/Objective: General- no acute distress. Pleasant. Oreinted. Normal speech.    Assessment and Plan: Patient has recent drop in hemoglobin and hematocrit over the past 5 months.  Prior hemoglobin was 13.6 and hemoglobin was 40.1.  Now  hemoglobin was 8.9 and hematocrit 30.1.  Patient reports no black or bloody stools.  Though he does have a history of rectal bleeding with constipation in the past.  This has not been recent.  Patient has known blood thinner but denies any diffuse or bruising or nosebleeds. No significant abdomen pain reported. Only occaional pain that is resolved by aciphex.  Will get cbc, iron studies, ifob cards and refer to Dr. Lyndel Safe as appears he was considering getting repeat colonoscopy.  Want labs done by tomorrow or latest Thursday morning. If significant further drop of hb/hct then may get hematologist opinion or if sever and acute drop then ED evaluation.  Pt will check bp after we get off phone and call office with reading bp and pulse.  Follow up 11 days or as needed  Follow Up Instructions:    I discussed the assessment and treatment plan with the patient. The patient was provided an opportunity to ask questions and all were answered. The patient agreed with the plan and demonstrated an understanding of the instructions.   The patient was advised to call back or seek an in-person evaluation if the symptoms worsen or if the condition fails to improve as anticipated.  I provided 25  minutes of non-face-to-face time during this encounter.   [name], PA-C  .  Review of Systems  Constitutional: Positive for fatigue. Negative for chills, fever and  unexpected weight change.       Yes. Very tired. Gradually getting more fatigue. He states maybe related to not being active and exercising. He is aware of multiple medical problems that are contributng.   Respiratory: Negative for cough, chest tightness, shortness of breath and wheezing.   Cardiovascular: Negative for chest pain and palpitations.  Gastrointestinal: Positive for abdominal pain and constipation. Negative for abdominal distention, blood in stool, diarrhea, nausea, rectal pain and vomiting.       Pt states with diuretic he can get  constipated.  occasonal upset stomach. Med that Dr. Lyndel Safe keeps his stomach pain controlled. No severe constant pain.  Musculoskeletal: Negative for back pain.       Pt states 4 days ago bumps elbowed and bursa flared. Can flex and extend and no pain. Gradually getting less swollen over bursae.  Skin: Negative for rash.  Neurological: Negative for dizziness, weakness, numbness and headaches.  Hematological: Negative for adenopathy. Does not bruise/bleed easily.  Psychiatric/Behavioral: Negative for behavioral problems.    Past Medical History:  Diagnosis Date  . 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 In light of concurrent left nephrolithiasis, will check CT renal colic next year instead of renal US.   [name] Anticoagulated on Coumadin 01/04/2018  . Anxiety 05/10/2016   Last Assessment & Plan:  Doing well off of zoloft.  . Asymptomatic microscopic hematuria 06/20/2017   Last Assessment & Plan:  Had hematuria workup in [facility] in 2016 which negative CT and cystoscopy. UA with 2+ blood last visit - we discussed recommendation for repeat workup at 5 years or if degree of hematuria progresses.   . Atrial fibrillation ([place]) 03/29/2017  . Atrial flutter ([place]) 03/29/2017  . Chronic allergic rhinitis 04/28/2016   Last Assessment & Plan:  Continue astelin  . Chronic anticoagulation 03/29/2017  . Chronic atrial fibrillation 07/23/2015   Last Assessment & Plan:  Coumadin and metoprolol, cardiology referral to establish care.  . Chronic midline back pain 12/14/2015   Last Assessment & Plan:  Pain management referral for further evaluation.  . Chronic prostatitis 07/23/2015   Last Assessment & Plan:  Has largely resolved since stopping bike riding. Recommend annual DRE AND PSA - will see back 12/2015 for annual screening, given 1st degree fhx. To call office for recurrent prostatitis symptoms.   [name] COPD (chronic obstructive pulmonary disease) ([place]) 06/12/2016   . Coronary artery disease involving native coronary artery of native heart with angina pectoris ([place]) 03/29/2017  . Cough 10/17/2016   Last Assessment & Plan:  Discussed typical course for acute viral illness. If symptoms worsen or fail to improve by 7-10d, delayed ATBs, fluids, rest, NSAIDs/APAP prn. Seek care if not improving. Needs earlier INR check due to ATBs.  [name] Dyspnea 02/01/2016   Last Assessment & Plan:  Overall improving, eval by pulm, plan for CT, neg stress test with cardiology. Recent switch to carvedilol due to side effects.  . Epidermoid cyst of skin 08/24/2017  . Essential hypertension 12/14/2015   Last Assessment & Plan:  Hypertension control: controlled  Medications: compliant Medication Management: as noted in orders Home blood pressure monitoring recommended additionally as needed for symptoms  The patient's care plan was reviewed and updated. Instructions and counseling were provided regarding patient goals and barriers. He was counseled to adopt a healthy lifestyle. Educational resources and self-management tools have been provided as charted in [facility] list.   . H/O maze procedure  03/29/2017  . [name] 03/29/2017   Overview:  2011  . Hx of CABG 03/29/2017  . Hyperlipidemia 03/29/2017  . Hypertensive heart disease 03/29/2017  . Kidney stones 07/23/2015   Overview:  x 3  Last Assessment & Plan:  By [place] has left nephrolithiasis, but not visible by KUB. Will check CT renal colic next year to assess both stone burden as well as to surveil AML.   . Leukocytoclastic vasculitis ([place]) 10/01/2017  . Localized edema 01/04/2018  . Lumbar radicular pain 01/19/2016  . Maculopapular rash 09/03/2017  . Nephrolithiasis 07/23/2015   Overview:  x 3  Last Assessment & Plan:  By [place] has left nephrolithiasis, but not visible by KUB. Will check CT renal colic next year to assess both stone burden as well as to surveil AML.   Overview:  x 3  Last Assessment & Plan:  Has 44m nonobstructing  LUP stone - not visible by KUB.  Will check renal [place] - he will contact office sooner if symptomatic.   . Non-sustained ventricular tachycardia ([place] 03/29/2017  . Other hyperlipidemia 03/29/2017  . Palpitations 10/01/2017  . Paroxysmal atrial fibrillation ([place] 03/29/2017  . Paroxysmal atrial fibrillation ([place] 03/29/2017  . Pleural effusion, bilateral 08/09/2017  . Post-nasal drainage 02/25/2016   Last Assessment & Plan:  Trial zyrtec and flonase  . Prostate cancer screening 06/20/2017   Last Assessment & Plan:  Recommend continued annual CaP screening until within 10 years of life expectancy. Given good health and fhx of longevity, would anticipate CaP screening to continue until age 72  PSA today and again in one year on day of visit.  . Pulmonary hypertension ([place] 08/09/2017  . Pulmonary nodules 06/12/2016  . S/P AVR (aortic valve replacement) 03/20/2016  . Supratherapeutic INR 07/26/2017  . Syncope 03/29/2017  . Syncope and collapse 02/01/2016  . Typical atrial flutter ([place] 02/01/2016     Social History   Socioeconomic History  . Marital status: Single    Spouse name: Not on file  . Number of children: Not on file  . Years of education: Not on file  . Highest education level: Not on file  Occupational History  . Not on file  Social Needs  . Financial resource strain: Not on file  . Food insecurity    Worry: Not on file    Inability: Not on file  . Transportation needs    Medical: Not on file    Non-medical: Not on file  Tobacco Use  . Smoking status: Former Smoker    Packs/day: 2.00    Years: 34.00    Pack years: 68.00    Types: Cigarettes    Quit date: 07/16/2000    Years since quitting: 18.9  . Smokeless tobacco: Never Used  Substance and Sexual Activity  . Alcohol use: Yes  . Drug use: No  . Sexual activity: Not on file  Lifestyle  . Physical activity    Days per week: Not on file    Minutes per session: Not on file  . Stress: Not on file  Relationships  . Social  [place]: Not on file    Gets together: Not on file    Attends religious service: Not on file    Active member of club or organization: Not on file    Attends meetings of clubs or organizations: Not on file    Relationship status: Not on file  . Intimate partner violence  Fear of current or ex partner: Not on file    Emotionally abused: Not on file    Physically abused: Not on file    Forced sexual activity: Not on file  Other Topics Concern  . Not on file  Social History Narrative  . Not on file    Past Surgical History:  Procedure Laterality Date  . CHOLECYSTECTOMY    . CORONARY ARTERY BYPASS GRAFT    . EXPLORATORY LAPAROTOMY  07/30/2017  . FOOT SURGERY    . FRACTURE SURGERY Right    wrist and forearm  . HERNIA REPAIR    . MECHANICAL AORTIC VALVE REPLACEMENT    . NASAL SINUS SURGERY    . RIGHT HEART CATH N/A 01/18/2018   Procedure: RIGHT HEART CATH;  Surgeon: Jolaine Artist, MD;  Location: [place] [facility];  Service: Cardiovascular;  Laterality: N/A;  . RIGHT HEART CATH N/A 05/14/2018   Procedure: RIGHT HEART CATH;  Surgeon: Jolaine Artist, MD;  Location: [place] [facility];  Service: Cardiovascular;  Laterality: N/A;  . TEE WITHOUT CARDIOVERSION N/A 05/21/2018   Procedure: TRANSESOPHAGEAL ECHOCARDIOGRAM (TEE);  Surgeon: Jolaine Artist, MD;  Location: [facility] ENDOSCOPY;  Service: Cardiovascular;  Laterality: N/A;  . UPPER GASTROINTESTINAL ENDOSCOPY  07/12/2017   Patchy areas of mucosal inflammation noted in the antrum with edema,erthema and ulcerations. Bx. Chronicfocally active gastritis.  [name] VASECTOMY      Family History  Problem Relation Age of Onset  . Asthma Mother   . Arthritis Mother   . Heart attack Father   . Hypertension Father   . Stroke Paternal Grandmother   . Colon cancer Neg Hx     Allergies  Allergen Reactions  . Protonix [Pantoprazole Sodium]     Gets gassy and starting itching like crazy  . Amoxicillin-Pot  Clavulanate Diarrhea    Stomach pain Has patient had a PCN reaction causing immediate rash, facial/tongue/throat swelling, SOB or lightheadedness with hypotension: No Has patient had a PCN reaction causing severe rash involving mucus membranes or skin necrosis: No Has patient had a PCN reaction that required hospitalization: No Has patient had a PCN reaction occurring within the last 10 years: Yes If all of the above answers are "NO", then may proceed with Cephalosporin use.   . Tape Rash and Other (See Comments)    Surgical tape    Current Outpatient Medications on File Prior to Visit  Medication Sig Dispense Refill  . acetaminophen (TYLENOL) 650 MG CR tablet Take 1 tablet (650 mg total) by mouth every 8 (eight) hours as needed for pain. Not to use with norco since combined with tylenol. 30 tablet 0  . albuterol (PROVENTIL HFA;VENTOLIN HFA) 108 (90 Base) MCG/ACT inhaler Inhale 2 puffs into the lungs as needed for wheezing or shortness of breath.    [name] aspirin EC 81 MG tablet Take 81 mg by mouth daily.    . B Complex-C (B-COMPLEX WITH VITAMIN C) tablet Take 1 tablet by mouth daily.     . cetirizine (ZYRTEC ALLERGY) 10 MG tablet Take 10 mg by mouth daily as needed for allergies.     . Cholecalciferol (VITAMIN D) 125 MCG (5000 UT) CAPS Take 1 capsule by mouth daily.    . Coenzyme Q10 (COQ10 PO) Take 1 tablet by mouth daily.    . hydrocortisone (ANUSOL-HC) 2.5 % rectal cream APP REC AA BID FOR 10 DAYS    . macitentan (OPSUMIT) 10 MG tablet Take 1 tablet (10 mg total) by  mouth daily. 30 tablet 0  . Magnesium Gluconate (MAGNESIUM 27 PO) Take 1 tablet by mouth daily.    . midodrine (PROAMATINE) 10 MG tablet Take 1 tablet (10 mg total) by mouth 3 (three) times daily with meals. 90 tablet 6  . multivitamin-lutein (OCUVITE-LUTEIN) CAPS capsule Take 1 capsule by mouth daily.    . potassium chloride 20 MEQ TBCR Take 40 mEq by mouth daily. 184 tablet 2  . rosuvastatin (CRESTOR) 5 MG tablet Take 1  tablet (5 mg total) by mouth daily. 30 tablet 6  . spironolactone (ALDACTONE) 25 MG tablet TAKE 1/2 OF A TABLET BY MOUTH ONCE DAILY. 45 tablet 3  . tadalafil, PAH, (ADCIRCA) 20 MG tablet TAKE 2 TABLETS BY MOUTH ONCE A DAY 60 tablet 12  . torsemide (DEMADEX) 20 MG tablet Take 3 tablets (60 mg total) by mouth daily. 270 tablet 3  . warfarin (COUMADIN) 5 MG tablet As directed by coumadin clinic (Patient taking differently: daily. As directed by coumadin clinic) 30 tablet 3   No current facility-administered medications on file prior to visit.     There were no vitals taken for this visit.      Objective:   Physical Exam        Assessment & Plan:

## 2019-06-10 NOTE — Telephone Encounter (Signed)
Spoke with patient, aware of lab results. Advised to take 85mq K today and going forward 473m daily. Pt reports understanding. Appt made to repeat bmet in 2 weeks. Pt advised to see PCP regarding anemia. Pt verbalized understanding and will speak with his primary.  Advised if having challenges, call our office to be referred to hematology.  Verbalized understanding.

## 2019-06-10 NOTE — Patient Instructions (Addendum)
Patient has recent drop in hemoglobin and hematocrit over the past 5 months.  Prior hemoglobin was 13.6 and hemoglobin was 40.1.  Now hemoglobin was 8.9 and hematocrit 30.1.  Patient reports no black or bloody stools.  Though he does have a history of rectal bleeding with constipation in the past.  This has not been recent.  Patient has known blood thinner but denies any diffuse or bruising or nosebleeds. No significant abdomen pain reported. Only occaional pain that is resolved by aciphex.  Will get cbc, iron studies, ifob cards and refer to Dr. Lyndel Safe as appears he was considering getting repeat colonoscopy.  Want labs done by tomorrow or latest Thursday morning. If significant further drop of hb/hct then may get hematologist opinion or if sever and acute drop then ED evaluation.  Pt will check bp after we get off phone and call office with reading bp and pulse.  Follow up 11 days or as needed

## 2019-06-10 NOTE — Telephone Encounter (Signed)
LVM to relay med changes and schedule f/u bmet in 2 weeks

## 2019-06-10 NOTE — Telephone Encounter (Signed)
-----  Message from Jolaine Artist, MD sent at 06/06/2019  4:13 PM EDT ----- Have him take kdur 60 meq x 1 then increase daily KCL to 40 daily. Repeat BMET 2 weeks  Have him see PCP ASAP for iron-def anemia. (If no PCP, refer to hematology)

## 2019-06-11 ENCOUNTER — Other Ambulatory Visit (INDEPENDENT_AMBULATORY_CARE_PROVIDER_SITE_OTHER): Payer: Medicare Other

## 2019-06-11 ENCOUNTER — Other Ambulatory Visit: Payer: Self-pay

## 2019-06-11 DIAGNOSIS — D649 Anemia, unspecified: Secondary | ICD-10-CM | POA: Diagnosis not present

## 2019-06-11 LAB — CBC WITH DIFFERENTIAL/PLATELET
Basophils Relative: 0.6 % (ref 0.0–3.0)
Eosinophils Relative: 3 % (ref 0.0–5.0)
HCT: 30 % — ABNORMAL LOW (ref 39.0–52.0)
Hemoglobin: 9.3 g/dL — ABNORMAL LOW (ref 13.0–17.0)
Lymphocytes Relative: 15 % (ref 12.0–46.0)
MCHC: 31 g/dL (ref 30.0–36.0)
MCV: 66.4 fl — ABNORMAL LOW (ref 78.0–100.0)
Monocytes Relative: 13 % — ABNORMAL HIGH (ref 3.0–12.0)
Neutrophils Relative %: 69 % (ref 43.0–77.0)
Platelets: 287 10*3/uL (ref 150.0–400.0)
RBC: 4.51 Mil/uL (ref 4.22–5.81)
RDW: 23.4 % — ABNORMAL HIGH (ref 11.5–15.5)
WBC: 5.7 10*3/uL (ref 4.0–10.5)

## 2019-06-12 ENCOUNTER — Telehealth (INDEPENDENT_AMBULATORY_CARE_PROVIDER_SITE_OTHER): Payer: Medicare Other | Admitting: Gastroenterology

## 2019-06-12 ENCOUNTER — Encounter: Payer: Self-pay | Admitting: Gastroenterology

## 2019-06-12 ENCOUNTER — Telehealth: Payer: Self-pay

## 2019-06-12 ENCOUNTER — Telehealth: Payer: Self-pay | Admitting: Medical

## 2019-06-12 VITALS — Ht 69.5 in | Wt 143.6 lb

## 2019-06-12 DIAGNOSIS — K7460 Unspecified cirrhosis of liver: Secondary | ICD-10-CM

## 2019-06-12 DIAGNOSIS — D649 Anemia, unspecified: Secondary | ICD-10-CM

## 2019-06-12 LAB — IRON,TIBC AND FERRITIN PANEL
%SAT: 4 % (calc) — ABNORMAL LOW (ref 20–48)
Ferritin: 17 ng/mL — ABNORMAL LOW (ref 24–380)
Iron: 18 ug/dL — ABNORMAL LOW (ref 50–180)
TIBC: 470 mcg/dL (calc) — ABNORMAL HIGH (ref 250–425)

## 2019-06-12 MED ORDER — FERROUS SULFATE 325 (65 FE) MG PO TBEC: 325.0000 mg | DELAYED_RELEASE_TABLET | Freq: Three times a day (TID) | ORAL | 1 refills | Status: DC

## 2019-06-12 NOTE — Telephone Encounter (Signed)
Rx ferrous sulfate sent to pt pharmacy.

## 2019-06-12 NOTE — Patient Instructions (Signed)
If you are age 72 or older, your body mass index should be between 23-30. Your Body mass index is 20.9 kg/m. If this is out of the aforementioned range listed, please consider follow up with your Primary Care Provider.  If you are age 32 or younger, your body mass index should be between 19-25. Your Body mass index is 20.9 kg/m. If this is out of the aformentioned range listed, please consider follow up with your Primary Care Provider.    You have been scheduled [facility] [place] [place] [address] [facility]   You are scheduled on 06/19/19 at [place] should arrive 15 minutes prior to your appointment time for registration. Please follow the written instructions below on the day of your exam:  WARNING: IF YOU ARE ALLERGIC TO IODINE/X-RAY DYE, PLEASE NOTIFY RADIOLOGY IMMEDIATELY AT [phone]! YOU WILL BE GIVEN A 13 HOUR PREMEDICATION PREP.  1) Do not eat or drink anything after 5am (4 hours prior to your test) 2) You have been given 2 bottles of oral contrast to drink. The solution may taste better if refrigerated, but do NOT add ice or any other liquid to this solution. Shake well before drinking.    Drink 1 bottle of contrast @ 7am (2 hours prior to your exam)  Drink 1 bottle of contrast @ 8am (1 hour prior to your exam)  You may take any medications as prescribed with a small amount of water, if necessary. If you take any of the following medications: METFORMIN, GLUCOPHAGE, GLUCOVANCE, AVANDAMET, RIOMET, FORTAMET, [place] MET, JANUMET, GLUMETZA or METAGLIP, you MAY be asked to HOLD this medication 48 hours AFTER the exam.  The purpose of you drinking the oral contrast is to aid in the visualization of your intestinal tract. The contrast solution may cause some diarrhea. Depending on your individual set of symptoms, you may also receive an intravenous injection of x-ray contrast/dye. Plan on being at [facility] for 30 minutes or longer, depending on the type of exam you are having performed.  This test typically takes 30-45 minutes to complete.  If you have any questions regarding your exam or if you need to reschedule, you may call the CT department at [phone] between the hours of 8:00 am and 5:00 pm, Monday-Friday.  ________________________________________________________________________   Start colace 1 tab po qd as he has done already. - Proceed with Hemoccult cards as already ordered. - Continue fluid and salt restricted diet as he has been doing. - Continue torsemide and spironolactone.   - Continue Midodrine  - FU in 12 weeks.     Thank you,  Dr. Jackquline Denmark

## 2019-06-12 NOTE — Telephone Encounter (Signed)
Copied from [place] [phone]. Topic: General - Other >> Jun 12, 2019  2:06 PM Valla Leaver wrote: Reason for CRM: Calling for lab results.

## 2019-06-12 NOTE — Telephone Encounter (Signed)
Informed Pt of labs results and recommendations. Pt needing IFOB stool kit mailed to him---> will do.

## 2019-06-12 NOTE — Progress Notes (Signed)
Chief Complaint: GERD  Referring Provider:  [name], PA-C      ASSESSMENT AND PLAN;   #1. GERD (s/p EGD 06/2017 - at [facility], [place] -report reviewed, mild gastritis with negative biopsies, no varices). Had allergic reaction to protonix. Now with bloating.  #2.  Liver Cirrhosis (likely cardiac liver cirrhosis with congestive hepatomegaly). No ETOH. No evidence of portal hypertension.  Mild ascites likely d/t right heart failure with cor pulmonale and pulmonary HTN. Neg [place] with doppler 04/2018. Nl AFP 3.2 04/2019. Alb 4.1 05/2019. [place] 06/06/2019-congestive hepatomegaly, prominent hepatic veins and IVC d/t elevated right heart pressures.  #3. Rectal bleeding with constipation d/t internal hemorrhoids. Neg hemoccult. Neg colon in [place] (resolved).  #4  Anemia of chronic disease.  No overt GI bleeding.  #5. Co-morbid conditions include COPD,RV failure with pulmonary HTN/cor pulmonale, Afib with Nl EF 55% (07/2019), CAD s/p CABG, mechanical AVR on Coumadin, chronic respiratory failure, HLD, HTN.  Leuko-classic vasculitis.  #6.  RUQ abdominal pain likely d/t congestive hepatomegaly.    Plan: - Start colace 1 tab po qd as he has done already. - Proceed with Hemoccult cards as already ordered. - Continue Aciphex 36m po qd #30, 11 refills. - Continue fluid and salt restricted diet as he has been doing. - Continue torsemide and spironolactone as per cardiology.   - Continue Midodrine 10 mg every morning, afternoon and 15 mg nightly - FU in 12 weeks.  - Proceed with CT scan abdo/pelvis. - Not keen on getting EGD or colonoscopy done since he has to be taken off Coumadin. Trend CBC.     HPI:    Lance Hicks a 73y.o. male  For follow-up visit Very complicated, retired pEngineer, structural very active on Internet.  Did not tolerate omeprazole or Protonix.  Has been doing well on AcipHex 20 mg p.o. once a day.  Denies having any upper GI complaints.  No nausea, vomiting,  heartburn, regurgitation, odynophagia or dysphagia.  Had right upper quadrant abdominal pain attributed to congestive hepatomegaly. LFTs showed some improvement as below. [place]11[place]2019-showed prominent hepatic veins and IVC likely due to elevated right heart pressures.  No ascites or any other acute abnormalities.  Doppler showed normal direction of blood flow towards the liver. His diuretics were increased.  This is resulted in improvement of the right sided abdominal pain.  He has lost approximately 6 pounds over last 1 week.  Has been constipated and is better on Colace 1 tablet p.o. once a day.  No further rectal bleeding.  He has been given Hemoccult cards by Ed Saguier  [place]  He had to be on Lovenox bridging prior to colonoscopy which he did not like.  He would like to avoid colonoscopy at the present time.   Fells better since he has lost weight.  Past GI procedures : EGD 06/2011 as above.   Colonoscopy 2015: neg. Had small polyp in 2012. [place] acute gallstone pancreatitis 07/2017.  Underwent lap chole on 07/29/2017.  Postoperatively had intra-abdominal bleed, taken back for open laparotomy.  Had bleeding from unclear omental source.  Had 8U PRBC.  Past Medical History:  Diagnosis Date  . 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 In light of concurrent left nephrolithiasis, will check CT renal colic next year instead of renal [place]   .Marland KitchenAnticoagulated on Coumadin 01/04/2018  . Anxiety 05/10/2016  Last Assessment & Plan:  Doing well off of zoloft.  . Asymptomatic microscopic hematuria 06/20/2017   Last Assessment & Plan:  Had hematuria workup in [facility] in 2016 which negative CT and cystoscopy. UA with 2+ blood last visit - we discussed recommendation for repeat workup at 5 years or if degree of hematuria progresses.   . Atrial fibrillation ([place]) 03/29/2017  . Atrial flutter ([place]) 03/29/2017  . Chronic  allergic rhinitis 04/28/2016   Last Assessment & Plan:  Continue astelin  . Chronic anticoagulation 03/29/2017  . Chronic atrial fibrillation 07/23/2015   Last Assessment & Plan:  Coumadin and metoprolol, cardiology referral to establish care.  . Chronic midline back pain 12/14/2015   Last Assessment & Plan:  Pain management referral for further evaluation.  . Chronic prostatitis 07/23/2015   Last Assessment & Plan:  Has largely resolved since stopping bike riding. Recommend annual DRE AND PSA - will see back 12/2015 for annual screening, given 1st degree fhx. To call office for recurrent prostatitis symptoms.   [name] COPD (chronic obstructive pulmonary disease) ([place]) 06/12/2016  . Coronary artery disease involving native coronary artery of native heart with angina pectoris ([place]) 03/29/2017  . Cough 10/17/2016   Last Assessment & Plan:  Discussed typical course for acute viral illness. If symptoms worsen or fail to improve by 7-10d, delayed ATBs, fluids, rest, NSAIDs/APAP prn. Seek care if not improving. Needs earlier INR check due to ATBs.  [name] Dyspnea 02/01/2016   Last Assessment & Plan:  Overall improving, eval by pulm, plan for CT, neg stress test with cardiology. Recent switch to carvedilol due to side effects.  . Epidermoid cyst of skin 08/24/2017  . Essential hypertension 12/14/2015   Last Assessment & Plan:  Hypertension control: controlled  Medications: compliant Medication Management: as noted in orders Home blood pressure monitoring recommended additionally as needed for symptoms  The patient's care plan was reviewed and updated. Instructions and counseling were provided regarding patient goals and barriers. He was counseled to adopt a healthy lifestyle. Educational resources and self-management tools have been provided as charted in [facility] list.   . H/O maze procedure 03/29/2017  . [name] 03/29/2017   Overview:  2011  . Hx of CABG 03/29/2017  . Hyperlipidemia 03/29/2017  .  Hypertensive heart disease 03/29/2017  . Kidney stones 07/23/2015   Overview:  x 3  Last Assessment & Plan:  By [place] has left nephrolithiasis, but not visible by KUB. Will check CT renal colic next year to assess both stone burden as well as to surveil AML.   . Leukocytoclastic vasculitis ([place]) 10/01/2017  . Localized edema 01/04/2018  . Lumbar radicular pain 01/19/2016  . Maculopapular rash 09/03/2017  . Nephrolithiasis 07/23/2015   Overview:  x 3  Last Assessment & Plan:  By [place] has left nephrolithiasis, but not visible by KUB. Will check CT renal colic next year to assess both stone burden as well as to surveil AML.   Overview:  x 3  Last Assessment & Plan:  Has 55m nonobstructing LUP stone - not visible by KUB.  Will check renal [place]8/2019 - he will contact office sooner if symptomatic.   . Non-sustained ventricular tachycardia ([place] 03/29/2017  . Other hyperlipidemia 03/29/2017  . Palpitations 10/01/2017  . Paroxysmal atrial fibrillation ([place] 03/29/2017  . Paroxysmal atrial fibrillation ([place] 03/29/2017  . Pleural effusion, bilateral 08/09/2017  . Post-nasal drainage 02/25/2016   Last Assessment & Plan:  Trial zyrtec and flonase  .  Prostate cancer screening 06/20/2017   Last Assessment & Plan:  Recommend continued annual CaP screening until within 10 years of life expectancy. Given good health and fhx of longevity, would anticipate CaP screening to continue until age 48.  PSA today and again in one year on day of visit.  . Pulmonary hypertension ([place]) 08/09/2017  . Pulmonary nodules 06/12/2016  . S/P AVR (aortic valve replacement) 03/20/2016  . Supratherapeutic INR 07/26/2017  . Syncope 03/29/2017  . Syncope and collapse 02/01/2016  . Typical atrial flutter ([place]) 02/01/2016    Past Surgical History:  Procedure Laterality Date  . CHOLECYSTECTOMY    . CORONARY ARTERY BYPASS GRAFT    . EXPLORATORY LAPAROTOMY  07/30/2017  . FOOT SURGERY    . FRACTURE SURGERY Right    wrist and forearm  . HERNIA REPAIR    .  MECHANICAL AORTIC VALVE REPLACEMENT    . NASAL SINUS SURGERY    . RIGHT HEART CATH N/A 01/18/2018   Procedure: RIGHT HEART CATH;  Surgeon: Jolaine Artist, MD;  Location: [place] [facility];  Service: Cardiovascular;  Laterality: N/A;  . RIGHT HEART CATH N/A 05/14/2018   Procedure: RIGHT HEART CATH;  Surgeon: Jolaine Artist, MD;  Location: [place] [facility];  Service: Cardiovascular;  Laterality: N/A;  . TEE WITHOUT CARDIOVERSION N/A 05/21/2018   Procedure: TRANSESOPHAGEAL ECHOCARDIOGRAM (TEE);  Surgeon: Jolaine Artist, MD;  Location: [facility] ENDOSCOPY;  Service: Cardiovascular;  Laterality: N/A;  . UPPER GASTROINTESTINAL ENDOSCOPY  07/12/2017   Patchy areas of mucosal inflammation noted in the antrum with edema,erthema and ulcerations. Bx. Chronicfocally active gastritis.  [name] VASECTOMY      Family History  Problem Relation Age of Onset  . Asthma Mother   . Arthritis Mother   . Heart attack Father   . Hypertension Father   . Stroke Paternal Grandmother   . Colon cancer Neg Hx     Social History   Tobacco Use  . Smoking status: Former Smoker    Packs/day: 2.00    Years: 34.00    Pack years: 68.00    Types: Cigarettes    Quit date: 07/16/2000    Years since quitting: 18.9  . Smokeless tobacco: Never Used  Substance Use Topics  . Alcohol use: Not Currently  . Drug use: No    Current Outpatient Medications  Medication Sig Dispense Refill  . acetaminophen (TYLENOL) 650 MG CR tablet Take 1 tablet (650 mg total) by mouth every 8 (eight) hours as needed for pain. Not to use with norco since combined with tylenol. 30 tablet 0  . albuterol (PROVENTIL HFA;VENTOLIN HFA) 108 (90 Base) MCG/ACT inhaler Inhale 2 puffs into the lungs as needed for wheezing or shortness of breath.    [name] aspirin EC 81 MG tablet Take 81 mg by mouth daily.    . B Complex-C (B-COMPLEX WITH VITAMIN C) tablet Take 1 tablet by mouth daily.     . cetirizine (ZYRTEC ALLERGY) 10 MG tablet Take 10 mg by mouth  daily as needed for allergies.     . Cholecalciferol (VITAMIN D) 125 MCG (5000 UT) CAPS Take 1 capsule by mouth daily.    . Coenzyme Q10 (COQ10 PO) Take 1 tablet by mouth daily.    . hydrocortisone (ANUSOL-HC) 2.5 % rectal cream APP REC AA BID FOR 10 DAYS    . macitentan (OPSUMIT) 10 MG tablet Take 1 tablet (10 mg total) by mouth daily. 30 tablet 0  . Magnesium Gluconate (MAGNESIUM 27  PO) Take 1 tablet by mouth daily.    . midodrine (PROAMATINE) 10 MG tablet Take 1 tablet (10 mg total) by mouth 3 (three) times daily with meals. 90 tablet 6  . multivitamin-lutein (OCUVITE-LUTEIN) CAPS capsule Take 1 capsule by mouth daily.    . potassium chloride 20 MEQ TBCR Take 40 mEq by mouth daily. 184 tablet 2  . rosuvastatin (CRESTOR) 5 MG tablet Take 1 tablet (5 mg total) by mouth daily. 30 tablet 6  . spironolactone (ALDACTONE) 25 MG tablet TAKE 1/2 OF A TABLET BY MOUTH ONCE DAILY. 45 tablet 3  . tadalafil, PAH, (ADCIRCA) 20 MG tablet TAKE 2 TABLETS BY MOUTH ONCE A DAY 60 tablet 12  . torsemide (DEMADEX) 20 MG tablet Take 3 tablets (60 mg total) by mouth daily. 270 tablet 3  . warfarin (COUMADIN) 5 MG tablet As directed by coumadin clinic (Patient taking differently: daily. As directed by coumadin clinic) 30 tablet 3   No current facility-administered medications for this visit.     Allergies  Allergen Reactions  . Protonix [Pantoprazole Sodium]     Gets gassy and starting itching like crazy  . Amoxicillin-Pot Clavulanate Diarrhea    Stomach pain Has patient had a PCN reaction causing immediate rash, facial/tongue/throat swelling, SOB or lightheadedness with hypotension: No Has patient had a PCN reaction causing severe rash involving mucus membranes or skin necrosis: No Has patient had a PCN reaction that required hospitalization: No Has patient had a PCN reaction occurring within the last 10 years: Yes If all of the above answers are "NO", then may proceed with Cephalosporin use.   . Tape Rash  and Other (See Comments)    Surgical tape    Review of Systems:  Has easy bruisability Multiple skin bruises.    Physical Exam:    Ht 5' 9.5" (1.765 m)   Wt 143 lb 9.6 oz (65.1 kg)   BMI 20.90 kg/m  Filed Weights   06/12/19 1357  Weight: 143 lb 9.6 oz (65.1 kg)   Not examined since it was a tele-visit  Data Reviewed: I have personally reviewed following labs and imaging studies  CBC: CBC Latest Ref Rng & Units 06/11/2019 06/06/2019 01/01/2019  WBC 4.0 - 10.5 K/uL 5.7 6.4 6.9  Hemoglobin 13.0 - 17.0 g/dL 9.3(L) 8.9(L) 13.6  Hematocrit 39.0 - 52.0 % 30.0(L) 30.1(L) 40.1  Platelets 150.0 - 400.0 K/uL 287.0 289 302.0    CMP: CMP Latest Ref Rng & Units 06/06/2019 01/01/2019 11/13/2018  Glucose 70 - 99 mg/dL 81 84 97  BUN 8 - 23 mg/dL 27(H) 29(H) 30(H)  Creatinine 0.61 - 1.24 mg/dL 1.34(H) 1.15 1.25  Sodium 135 - 145 mmol/L 135 136 135  Potassium 3.5 - 5.1 mmol/L 3.4(L) 4.2 3.9  Chloride 98 - 111 mmol/L 102 101 98  CO2 22 - 32 mmol/L _0 Calcium 8.9 - 10.3 mg/dL 9.2 9.9 9.9  Total Protein 6.5 - 8.1 g/dL 8.5(H) 7.8 -  Total Bilirubin 0.3 - 1.2 mg/dL 2.1(H) 1.3(H) -  Alkaline Phos 38 - 126 U/L 69 65 -  AST 15 - 41 U/L 32 27 -  ALT 0 - 44 U/L 16 13 -   Hepatic Function Latest Ref Rng & Units 06/06/2019 01/01/2019 05/18/2018  Total Protein 6.5 - 8.1 g/dL 8.5(H) 7.8 7.3  Albumin 3.5 - 5.0 g/dL 4.1 4.3 2.9(L)  AST 15 - 41 U/L 32 27 39  ALT 0 - 44 U/L 16 13 13(L)  Alk Phosphatase 38 -  126 U/L 69 65 69  Total Bilirubin 0.3 - 1.2 mg/dL 2.1(H) 1.3(H) 2.5(H)  Bilirubin, Direct 0.1 - 0.5 mg/dL - - 1.1(H)       Radiology Studies:  Ct Chest High Resolution  Result Date: 05/16/2018 IMPRESSION: 1. Slightly irregular 0.9 cm peripheral right middle lobe solid pulmonary nodule. 2. minimal associated traction bronchiolectasis. 3. Mild cardiomegaly. Minimal interlobular septal thickening and trace dependent bilateral pleural effusions. These findings suggest a mild component of  congestive heart failure. 4. Small to moderate volume perihepatic ascites. 5. Ectatic 4.3 cm ascending thoracic aorta. 6. Left main and 3 vessel coronary atherosclerosis. Aortic Atherosclerosis (ICD10-I70.0) and Emphysema (ICD10-J43.9). Electronically Signed   By: Ilona Sorrel M.D.   On: 05/16/2018 16:11    US Liver Doppler:  Result Date: 05/16/2018 CLINICAL DATA:  Hepatic cirrhosis. EXAM: DUPLEX ULTRASOUND OF LIVER TECHNIQUE: Color and duplex Doppler ultrasound was performed to evaluate the hepatic in-flow and out-flow vessels. COMPARISON:  None. FINDINGS: Portal Vein Velocities Main:  26 cm/sec Right:  14 cm/sec Left:  18 cm/sec Normal hepatopetal flow is noted in the portal veins. Hepatic Vein Velocities Right:  40 cm/sec Middle:  30 cm/sec Left:  32 cm/sec Normal hepatofugal flow is noted in the hepatic veins. Hepatic Artery Velocity:  65 cm/sec Splenic Vein Velocity:  33.4 cm/sec Varices: Absent. Ascites: Present. Superior mesenteric vein velocity: 30 centimeters/second IMPRESSION: No Doppler evidence of hepatic, splenic or portal venous thrombosis or occlusion. Mild ascites is noted. Electronically Signed   By: Marijo Conception, M.D.   On: 05/16/2018 13:47   US Abdomen Limited Ruq:  Result Date: 05/16/2018 CLINICAL DATA:  Hepatic cirrhosis. EXAM: ULTRASOUND ABDOMEN LIMITED RIGHT UPPER QUADRANT COMPARISON:  None. FINDINGS: Gallbladder: Status post cholecystectomy. Common bile duct: Diameter: 3.5 mm which is within normal limits. Liver: No focal lesion identified. Mildly heterogeneous echotexture of hepatic parenchyma is noted with nodular hepatic contours suggesting cirrhosis. Mild ascites is noted. Portal vein is patent on color Doppler imaging with normal direction of blood flow towards the liver. IMPRESSION: Status post cholecystectomy. Findings consistent with hepatic cirrhosis. Mild ascites is noted around the liver. No focal abnormality is noted. Electronically Signed   By: Marijo Conception, M.D.    On: 05/16/2018 13:32    This service was provided via telemedicine.  The patient was located at home.  The provider was located in office.  The patient did consent to this telephone visit and is aware of possible charges through their insurance for this visit.   Time spent on call: 25 min     Carmell Austria, MD 06/12/2019, 3:32 PM  Cc: [name], PA-C

## 2019-06-19 ENCOUNTER — Other Ambulatory Visit: Payer: Self-pay

## 2019-06-19 ENCOUNTER — Other Ambulatory Visit (INDEPENDENT_AMBULATORY_CARE_PROVIDER_SITE_OTHER): Payer: Medicare Other

## 2019-06-19 ENCOUNTER — Ambulatory Visit (HOSPITAL_BASED_OUTPATIENT_CLINIC_OR_DEPARTMENT_OTHER)
Admission: RE | Admit: 2019-06-19 | Discharge: 2019-06-19 | Disposition: A | Discharge status: Home / Self Care | Payer: Medicare Other | Source: Ambulatory Visit | Attending: Gastroenterology | Admitting: Gastroenterology

## 2019-06-19 ENCOUNTER — Encounter (HOSPITAL_BASED_OUTPATIENT_CLINIC_OR_DEPARTMENT_OTHER): Payer: Self-pay

## 2019-06-19 DIAGNOSIS — D649 Anemia, unspecified: Secondary | ICD-10-CM

## 2019-06-19 DIAGNOSIS — Z8719 Personal history of other diseases of the digestive system: Secondary | ICD-10-CM | POA: Diagnosis not present

## 2019-06-19 DIAGNOSIS — K7460 Unspecified cirrhosis of liver: Secondary | ICD-10-CM | POA: Insufficient documentation

## 2019-06-19 DIAGNOSIS — K5730 Diverticulosis of large intestine without perforation or abscess without bleeding: Secondary | ICD-10-CM | POA: Diagnosis not present

## 2019-06-19 HISTORY — DX: Unspecified asthma, uncomplicated: J45.909

## 2019-06-19 LAB — FECAL OCCULT BLOOD, IMMUNOCHEMICAL: Fecal Occult Bld: POSITIVE — AB

## 2019-06-19 MED ORDER — IOHEXOL 300 MG/ML  SOLN
100.0000 mL | Freq: Once | INTRAMUSCULAR | Status: AC | PRN
  Administered 2019-06-19: 100 mL via INTRAVENOUS

## 2019-06-23 ENCOUNTER — Telehealth: Payer: Self-pay

## 2019-06-23 ENCOUNTER — Other Ambulatory Visit (HOSPITAL_BASED_OUTPATIENT_CLINIC_OR_DEPARTMENT_OTHER): Payer: Medicare Other

## 2019-06-23 NOTE — Telephone Encounter (Signed)
Pt has telephone visit call tomorrow. Will discuss his lymph node concerns then.

## 2019-06-23 NOTE — Telephone Encounter (Signed)
Copied from [place] [id number]. Topic: General - Inquiry >> Jun 23, 2019 11:06 AM Alanda Slim E wrote: Reason for CRM: Pt stated that he noticed his Lymph glands were starting to swell after his CT Scan. The swelling started after the CT process on Friday and the swelling goes down during the day and returns at night. Pt wanted to know if this could be related in any way to the CT process(what he had to drink or the injection he received) / please advise

## 2019-06-24 ENCOUNTER — Ambulatory Visit (INDEPENDENT_AMBULATORY_CARE_PROVIDER_SITE_OTHER): Payer: Medicare Other | Admitting: Medical

## 2019-06-24 ENCOUNTER — Telehealth: Payer: Self-pay

## 2019-06-24 ENCOUNTER — Encounter: Payer: Self-pay | Admitting: Medical

## 2019-06-24 ENCOUNTER — Other Ambulatory Visit: Payer: Self-pay

## 2019-06-24 VITALS — BP 112/72 | HR 74 | Temp 97.6°F | Wt 143.0 lb

## 2019-06-24 DIAGNOSIS — R195 Other fecal abnormalities: Secondary | ICD-10-CM

## 2019-06-24 DIAGNOSIS — J449 Chronic obstructive pulmonary disease, unspecified: Secondary | ICD-10-CM

## 2019-06-24 DIAGNOSIS — I2510 Atherosclerotic heart disease of native coronary artery without angina pectoris: Secondary | ICD-10-CM | POA: Diagnosis not present

## 2019-06-24 DIAGNOSIS — Z8679 Personal history of other diseases of the circulatory system: Secondary | ICD-10-CM

## 2019-06-24 DIAGNOSIS — D649 Anemia, unspecified: Secondary | ICD-10-CM

## 2019-06-24 DIAGNOSIS — I4891 Unspecified atrial fibrillation: Secondary | ICD-10-CM

## 2019-06-24 NOTE — Patient Instructions (Addendum)
For anemia and low iron. I rx'd iron. Will repeat cbc in 2 weeks or sooner if needed for worsening fatigue,diffuse bruising or black stools as he is on coumadin. Notified Dr. Lyndel Safe of heme positive card. Pt has history of some internal hemorrhoids per his report and GI expressed reluctance to do procedure in light of multiple medical problems. Will update Dr. Lyndel Safe on repeat cbc as well.  For copd, chf and atrial fibrillation pt seeing pulmonologist this week and cardiolgoist next week.  Glad to hear olecranon bursitis resolved.  Regarding lymph nodes submandibular area those were probably reactive. Now resolved per pt.  Follow up date to be determied after lab review.

## 2019-06-24 NOTE — Telephone Encounter (Signed)
lmom for prescreen  

## 2019-06-24 NOTE — Progress Notes (Signed)
Subjective:    Patient ID: Lance Hicks, male    DOB: 05/22/1946, 73 y.o.   MRN: [id number]  HPI  Pt states he feels overall a lot better. He states only mild fatigue but better than last visit.  Pt is anemic and his hb/hct is now better. Also low iron. I rx'd iron for pt to start. Pt stool card was positive for blood.Pt states he has history of internal hemorrhoids in the past. Also he is coumadin.  Pt mentioned day after ct of chest he had some mild swelling of submandibular lymph nodes. Now his nodes are back to normal. No st recently.  Rt side elbow burrow  now just size of quarter.  Pt has hx of atrial fibrillation and hx of chf. Pt seeing cardiologist next week. Pt on coumadin.   Review of Systems  Constitutional: Positive for fatigue. Negative for chills, diaphoresis and fever.  HENT: Negative for congestion, dental problem, ear discharge, hearing loss, mouth sores, postnasal drip, rhinorrhea, sinus pressure, sinus pain and sore throat.   Respiratory: Negative for chest tightness, shortness of breath and wheezing.   Cardiovascular: Negative for chest pain and palpitations.  Gastrointestinal: Negative for abdominal pain, constipation, nausea and vomiting.  Genitourinary: Negative for dysuria, flank pain, hematuria, penile pain and urgency.  Musculoskeletal: Negative for back pain.  Skin: Negative for rash.  Neurological: Negative for dizziness, speech difficulty, weakness, numbness and headaches.  Hematological: Negative for adenopathy. Does not bruise/bleed easily.  Psychiatric/Behavioral: Negative for behavioral problems, confusion and dysphoric mood. The patient is not nervous/anxious and is not hyperactive.     Past Medical History:  Diagnosis Date  . 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 In light of concurrent left nephrolithiasis, will check CT renal colic next year instead of renal US.   [name]  Anticoagulated on Coumadin 01/04/2018  . Anxiety 05/10/2016   Last Assessment & Plan:  Doing well off of zoloft.  . Asthma   . Asymptomatic microscopic hematuria 06/20/2017   Last Assessment & Plan:  Had hematuria workup in [facility] in 2016 which negative CT and cystoscopy. UA with 2+ blood last visit - we discussed recommendation for repeat workup at 5 years or if degree of hematuria progresses.   . Atrial fibrillation ([place]) 03/29/2017  . Atrial flutter ([place]) 03/29/2017  . Chronic allergic rhinitis 04/28/2016   Last Assessment & Plan:  Continue astelin  . Chronic anticoagulation 03/29/2017  . Chronic atrial fibrillation 07/23/2015   Last Assessment & Plan:  Coumadin and metoprolol, cardiology referral to establish care.  . Chronic midline back pain 12/14/2015   Last Assessment & Plan:  Pain management referral for further evaluation.  . Chronic prostatitis 07/23/2015   Last Assessment & Plan:  Has largely resolved since stopping bike riding. Recommend annual DRE AND PSA - will see back 12/2015 for annual screening, given 1st degree fhx. To call office for recurrent prostatitis symptoms.   [name] COPD (chronic obstructive pulmonary disease) ([place]) 06/12/2016  . Coronary artery disease involving native coronary artery of native heart with angina pectoris ([place]) 03/29/2017  . Cough 10/17/2016   Last Assessment & Plan:  Discussed typical course for acute viral illness. If symptoms worsen or fail to improve by 7-10d, delayed ATBs, fluids, rest, NSAIDs/APAP prn. Seek care if not improving. Needs earlier INR check due to ATBs.  [name] Dyspnea 02/01/2016   Last Assessment & Plan:  Overall improving, eval by pulm,  plan for CT, neg stress test with cardiology. Recent switch to carvedilol due to side effects.  . Epidermoid cyst of skin 08/24/2017  . Essential hypertension 12/14/2015   Last Assessment & Plan:  Hypertension control: controlled  Medications: compliant Medication Management: as noted in orders Home blood pressure monitoring  recommended additionally as needed for symptoms  The patient's care plan was reviewed and updated. Instructions and counseling were provided regarding patient goals and barriers. He was counseled to adopt a healthy lifestyle. Educational resources and self-management tools have been provided as charted in [facility] list.   . H/O maze procedure 03/29/2017  . [name] 03/29/2017   Overview:  2011  . Hx of CABG 03/29/2017  . Hyperlipidemia 03/29/2017  . Hypertensive heart disease 03/29/2017  . Kidney stones 07/23/2015   Overview:  x 3  Last Assessment & Plan:  By [place] has left nephrolithiasis, but not visible by KUB. Will check CT renal colic next year to assess both stone burden as well as to surveil AML.   . Leukocytoclastic vasculitis ([place]) 10/01/2017  . Localized edema 01/04/2018  . Lumbar radicular pain 01/19/2016  . Maculopapular rash 09/03/2017  . Nephrolithiasis 07/23/2015   Overview:  x 3  Last Assessment & Plan:  By [place] has left nephrolithiasis, but not visible by KUB. Will check CT renal colic next year to assess both stone burden as well as to surveil AML.   Overview:  x 3  Last Assessment & Plan:  Has 57m nonobstructing LUP stone - not visible by KUB.  Will check renal [place] - he will contact office sooner if symptomatic.   . Non-sustained ventricular tachycardia ([place] 03/29/2017  . Other hyperlipidemia 03/29/2017  . Palpitations 10/01/2017  . Paroxysmal atrial fibrillation ([place] 03/29/2017  . Paroxysmal atrial fibrillation ([place] 03/29/2017  . Pleural effusion, bilateral 08/09/2017  . Post-nasal drainage 02/25/2016   Last Assessment & Plan:  Trial zyrtec and flonase  . Prostate cancer screening 06/20/2017   Last Assessment & Plan:  Recommend continued annual CaP screening until within 10 years of life expectancy. Given good health and fhx of longevity, would anticipate CaP screening to continue until age 89  PSA today and again in one year on day of visit.  . Pulmonary hypertension  ([place] 08/09/2017  . Pulmonary nodules 06/12/2016  . S/P AVR (aortic valve replacement) 03/20/2016  . Supratherapeutic INR 07/26/2017  . Syncope 03/29/2017  . Syncope and collapse 02/01/2016  . Typical atrial flutter ([place] 02/01/2016     Social History   Socioeconomic History  . Marital status: Single    Spouse name: Not on file  . Number of children: Not on file  . Years of education: Not on file  . Highest education level: Not on file  Occupational History  . Not on file  Social Needs  . Financial resource strain: Not on file  . Food insecurity    Worry: Not on file    Inability: Not on file  . Transportation needs    Medical: Not on file    Non-medical: Not on file  Tobacco Use  . Smoking status: Former Smoker    Packs/day: 2.00    Years: 34.00    Pack years: 68.00    Types: Cigarettes    Quit date: 07/16/2000    Years since quitting: 18.9  . Smokeless tobacco: Never Used  Substance and Sexual Activity  . Alcohol use: Not Currently  . Drug use: No  . Sexual  activity: Not on file  Lifestyle  . Physical activity    Days per week: Not on file    Minutes per session: Not on file  . Stress: Not on file  Relationships  . Social Herbalist on phone: Not on file    Gets together: Not on file    Attends religious service: Not on file    Active member of club or organization: Not on file    Attends meetings of clubs or organizations: Not on file    Relationship status: Not on file  . Intimate partner violence    Fear of current or ex partner: Not on file    Emotionally abused: Not on file    Physically abused: Not on file    Forced sexual activity: Not on file  Other Topics Concern  . Not on file  Social History Narrative  . Not on file    Past Surgical History:  Procedure Laterality Date  . CHOLECYSTECTOMY    . CORONARY ARTERY BYPASS GRAFT    . EXPLORATORY LAPAROTOMY  07/30/2017  . FOOT SURGERY    . FRACTURE SURGERY Right    wrist and forearm  . HERNIA  REPAIR    . MECHANICAL AORTIC VALVE REPLACEMENT    . NASAL SINUS SURGERY    . RIGHT HEART CATH N/A 01/18/2018   Procedure: RIGHT HEART CATH;  Surgeon: Jolaine Artist, MD;  Location: [place] [facility];  Service: Cardiovascular;  Laterality: N/A;  . RIGHT HEART CATH N/A 05/14/2018   Procedure: RIGHT HEART CATH;  Surgeon: Jolaine Artist, MD;  Location: [place] [facility];  Service: Cardiovascular;  Laterality: N/A;  . TEE WITHOUT CARDIOVERSION N/A 05/21/2018   Procedure: TRANSESOPHAGEAL ECHOCARDIOGRAM (TEE);  Surgeon: Jolaine Artist, MD;  [facility] ENDOSCOPY;  Service: Cardiovascular;  Laterality: N/A;  . UPPER GASTROINTESTINAL ENDOSCOPY  07/12/2017   Patchy areas of mucosal inflammation noted in the antrum with edema,erthema and ulcerations. Bx. Chronicfocally active gastritis.  [name] VASECTOMY      Family History  Problem Relation Age of Onset  . Asthma Mother   . Arthritis Mother   . Heart attack Father   . Hypertension Father   . Stroke Paternal Grandmother   . Colon cancer Neg Hx     Allergies  Allergen Reactions  . Protonix [Pantoprazole Sodium]     Gets gassy and starting itching like crazy  . Amoxicillin-Pot Clavulanate Diarrhea    Stomach pain Has patient had a PCN reaction causing immediate rash, facial/tongue/throat swelling, SOB or lightheadedness with hypotension: No Has patient had a PCN reaction causing severe rash involving mucus membranes or skin necrosis: No Has patient had a PCN reaction that required hospitalization: No Has patient had a PCN reaction occurring within the last 10 years: Yes If all of the above answers are "NO", then may proceed with Cephalosporin use.   . Tape Rash and Other (See Comments)    Surgical tape    Current Outpatient Medications on File Prior to Visit  Medication Sig Dispense Refill  . acetaminophen (TYLENOL) 650 MG CR tablet Take 1 tablet (650 mg total) by mouth every 8 (eight) hours as needed for pain. Not to use with  norco since combined with tylenol. 30 tablet 0  . albuterol (PROVENTIL HFA;VENTOLIN HFA) 108 (90 Base) MCG/ACT inhaler Inhale 2 puffs into the lungs as needed for wheezing or shortness of breath.    [name] aspirin EC 81 MG tablet Take 81 mg  by mouth daily.    . B Complex-C (B-COMPLEX WITH VITAMIN C) tablet Take 1 tablet by mouth daily.     . cetirizine (ZYRTEC ALLERGY) 10 MG tablet Take 10 mg by mouth daily as needed for allergies.     . Cholecalciferol (VITAMIN D) 125 MCG (5000 UT) CAPS Take 1 capsule by mouth daily.    . Coenzyme Q10 (COQ10 PO) Take 1 tablet by mouth daily.    . ferrous sulfate 325 (65 FE) MG EC tablet Take 1 tablet (325 mg total) by mouth 3 (three) times daily with meals. 90 tablet 1  . hydrocortisone (ANUSOL-HC) 2.5 % rectal cream APP REC AA BID FOR 10 DAYS    . macitentan (OPSUMIT) 10 MG tablet Take 1 tablet (10 mg total) by mouth daily. 30 tablet 0  . Magnesium Gluconate (MAGNESIUM 27 PO) Take 1 tablet by mouth daily.    . midodrine (PROAMATINE) 10 MG tablet Take 1 tablet (10 mg total) by mouth 3 (three) times daily with meals. 90 tablet 6  . multivitamin-lutein (OCUVITE-LUTEIN) CAPS capsule Take 1 capsule by mouth daily.    . potassium chloride 20 MEQ TBCR Take 40 mEq by mouth daily. 184 tablet 2  . rosuvastatin (CRESTOR) 5 MG tablet Take 1 tablet (5 mg total) by mouth daily. 30 tablet 6  . spironolactone (ALDACTONE) 25 MG tablet TAKE 1/2 OF A TABLET BY MOUTH ONCE DAILY. 45 tablet 3  . tadalafil, PAH, (ADCIRCA) 20 MG tablet TAKE 2 TABLETS BY MOUTH ONCE A DAY 60 tablet 12  . torsemide (DEMADEX) 20 MG tablet Take 3 tablets (60 mg total) by mouth daily. 270 tablet 3  . warfarin (COUMADIN) 5 MG tablet As directed by coumadin clinic (Patient taking differently: daily. As directed by coumadin clinic) 30 tablet 3   No current facility-administered medications on file prior to visit.     There were no vitals taken for this visit.      Objective:   Physical Exam  General- no  acute distress. Pleasant. Normal speech. Non labored.      Assessment & Plan:  For anemia and low iron. I rx'd iron. Will repeat cbc in 2 weeks or sooner if needed for worsening fatigue,diffuse bruising or black stools as he is on coumadin. Notified Dr. Lyndel Safe of heme positive card. Pt has history of some internal hemorrhoids per his report and GI expressed reluctance to do procedure in light of multiple medical problems. Will update Dr. Lyndel Safe on repeat cbc as well.  For copd, chf and atrial fibrillation pt seeing pulmonologist this week and cardiolgoist next week.  Glad to hear olecranon bursitis resolved.  Regarding lymph nodes submandibular area those were probably reactive. Now resolved per pt.  Follow up date to be determied after lab review.   [name], PA-C

## 2019-06-26 ENCOUNTER — Other Ambulatory Visit (HOSPITAL_COMMUNITY): Payer: Self-pay | Admitting: Internal Medicine

## 2019-06-30 ENCOUNTER — Other Ambulatory Visit: Payer: Self-pay

## 2019-06-30 ENCOUNTER — Telehealth: Payer: Self-pay | Admitting: Medical

## 2019-06-30 ENCOUNTER — Other Ambulatory Visit (INDEPENDENT_AMBULATORY_CARE_PROVIDER_SITE_OTHER): Payer: Medicare Other

## 2019-06-30 DIAGNOSIS — D649 Anemia, unspecified: Secondary | ICD-10-CM

## 2019-06-30 LAB — CBC WITH DIFFERENTIAL/PLATELET
Basophils Absolute: 0.1 10*3/uL (ref 0.0–0.1)
Basophils Relative: 0.8 % (ref 0.0–3.0)
Eosinophils Absolute: 0.1 10*3/uL (ref 0.0–0.7)
Eosinophils Relative: 2.2 % (ref 0.0–5.0)
HCT: 34.2 % — ABNORMAL LOW (ref 39.0–52.0)
Hemoglobin: 10.7 g/dL — ABNORMAL LOW (ref 13.0–17.0)
Lymphocytes Relative: 20.9 % (ref 12.0–46.0)
Lymphs Abs: 1.3 10*3/uL (ref 0.7–4.0)
MCHC: 31.4 g/dL (ref 30.0–36.0)
MCV: 71.8 fl — ABNORMAL LOW (ref 78.0–100.0)
Monocytes Absolute: 0.8 10*3/uL (ref 0.1–1.0)
Monocytes Relative: 12.7 % — ABNORMAL HIGH (ref 3.0–12.0)
Neutro Abs: 4.1 10*3/uL (ref 1.4–7.7)
Neutrophils Relative %: 63.4 % (ref 43.0–77.0)
Platelets: 243 10*3/uL (ref 150.0–400.0)
RBC: 4.76 Mil/uL (ref 4.22–5.81)
RDW: 35.1 % — ABNORMAL HIGH (ref 11.5–15.5)
WBC: 6.4 10*3/uL (ref 4.0–10.5)

## 2019-06-30 NOTE — Progress Notes (Signed)
Cardiology Office Note:    Date:  07/01/2019   ID:  Jobin, Montelongo Mar 24, 1946, MRN [id number]  PCP:  [name], PA-C  Cardiologist:  Shirlee More, MD    Referring MD: [name], PA-C      Virtual Visit via Telephone Note   [place] he does not have Internet access or smart phone  This visit type was conducted due to national recommendations for restrictions regarding the COVID-19 Pandemic (e.g. social distancing) in an effort to limit this patient's exposure and mitigate transmission in our community.  Due to his co-morbid illnesses, this patient is at least at moderate risk for complications without adequate follow up.  This format is felt to be most appropriate for this patient at this time.  The patient did not have access to video technology/had technical difficulties with video requiring transitioning to audio format only (telephone).  All issues noted in this document were discussed and addressed.  No physical exam could be performed with this format.  Please refer to the patient's chart for his  consent to telehealth for [facility].   ASSESSMENT:    1. Atrial flutter, unspecified type ([place])   2. Chronic anticoagulation   3. S/P AVR   4. PAH (pulmonary artery hypertension) ([place])   5. Hypotension, chronic   6. Cirrhosis of liver not due to alcohol ([place])   7. Mixed hyperlipidemia   8. Hypertensive heart and chronic kidney disease with heart failure and stage 1 through stage 4 chronic kidney disease, or chronic kidney disease ([place])   9. Anemia, unspecified type   10. Hypokalemia due to excessive renal loss of potassium    PLAN:    In order of problems listed above:  1. Close and incredibly complicated patient requiring multiple specialist including heart failure pulmonary artery hypertension primary care and GI all working together in an integrated medical record.  His rhythm is atrial flutter controlled rate asymptomatic  and he has warfarin for stroke prophylaxis goal INR is 3.  Next visit check in office EKG 2. Stable continue warfarin he has had no further bleeding from his hemorrhoids his hemoglobin is improved he tolerates oral iron and will continue being managed through our anticoagulation clinic and was seen today 3. He has a mechanical AVR normal function continue warfarin 4. Pulmonary artery hypertension initially very symptomatic class III with severe right heart failure and hypotension markedly improved with drug therapy currently asymptomatic.  Continue his treatment as directed by Dr. Haroldine Laws 5. Hypotension improved continue midodrine 6. Cirrhosis stable managed by GI 7. Hyperlipidemia stable high risk with CAD and continue his high intensity statin.  He has no evidence of liver toxicity LDL is at target and no muscular symptoms 8. Hypertensive heart and chronic kidney disease stable recent renal function has maintained I will follow-up labs when seen in the heart failure clinic 9. Anemia iron deficiency with GI blood loss improved no longer having rectal bleeding and continue his iron supplement 10. Continue potassium recheck electrolytes at follow-up in the heart failure clinic scheduled in the next few weeks   Next appointment: 3 months in office   Medication Adjustments/Labs and Tests Ordered: Current medicines are reviewed at length with the patient today.  Concerns regarding medicines are outlined above.  No orders of the defined types were placed in this encounter.  No orders of the defined types were placed in this encounter.   Chief complaint:   History of Present  Illness:    Lance Hicks is a 73 y.o. male with a hx of  permanent atrial fibrillation coronary artery disease aortic stenosis CABG aortic valve replacement #25 carbo metrics valve 2010 Maze procedure and left atrial appendage excision and pulmonary artery hypertension treated with Macitentan and Adcirca .  He has also  has had a significant decline in GFR with CKD and symptomatic hypotension last seen  last seen by me 03/27/2019.  Compliance with diet, lifestyle and medications: Yes  Armando is improved his hemoglobin now is improved to 10.7 with iron therapy he has no exercise intolerance shortness of breath edema chest pain palpitation or syncope Home blood pressure runs greater than 941 systolic on his midodrine and tolerates medications for pulmonary artery hypertension.  Presently New York Heart Association class I.  He has no edema orthopnea or syncope.  He remains anticoagulated INR today therapeutic 2.9 recent hemoglobin 10.7 microcytic and he is on iron I told him if he has difficulty tolerating traditional iron to use children's over-the-counter chewable which is the best tolerated form of iron replacement.  Past Medical History:  Diagnosis Date  . 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 In light of concurrent left nephrolithiasis, will check CT renal colic next year instead of renal US.   [name] Anticoagulated on Coumadin 01/04/2018  . Anxiety 05/10/2016   Last Assessment & Plan:  Doing well off of zoloft.  . Asthma   . Asymptomatic microscopic hematuria 06/20/2017   Last Assessment & Plan:  Had hematuria workup in [facility] in 2016 which negative CT and cystoscopy. UA with 2+ blood last visit - we discussed recommendation for repeat workup at 5 years or if degree of hematuria progresses.   . Atrial fibrillation ([place]) 03/29/2017  . Atrial flutter ([place]) 03/29/2017  . Chronic allergic rhinitis 04/28/2016   Last Assessment & Plan:  Continue astelin  . Chronic anticoagulation 03/29/2017  . Chronic atrial fibrillation 07/23/2015   Last Assessment & Plan:  Coumadin and metoprolol, cardiology referral to establish care.  . Chronic midline back pain 12/14/2015   Last Assessment & Plan:  Pain management referral for further evaluation.  . Chronic prostatitis  07/23/2015   Last Assessment & Plan:  Has largely resolved since stopping bike riding. Recommend annual DRE AND PSA - will see back 12/2015 for annual screening, given 1st degree fhx. To call office for recurrent prostatitis symptoms.   [name] COPD (chronic obstructive pulmonary disease) ([place]) 06/12/2016  . Coronary artery disease involving native coronary artery of native heart with angina pectoris ([place]) 03/29/2017  . Cough 10/17/2016   Last Assessment & Plan:  Discussed typical course for acute viral illness. If symptoms worsen or fail to improve by 7-10d, delayed ATBs, fluids, rest, NSAIDs/APAP prn. Seek care if not improving. Needs earlier INR check due to ATBs.  [name] Dyspnea 02/01/2016   Last Assessment & Plan:  Overall improving, eval by pulm, plan for CT, neg stress test with cardiology. Recent switch to carvedilol due to side effects.  . Epidermoid cyst of skin 08/24/2017  . Essential hypertension 12/14/2015   Last Assessment & Plan:  Hypertension control: controlled  Medications: compliant Medication Management: as noted in orders Home blood pressure monitoring recommended additionally as needed for symptoms  The patient's care plan was reviewed and updated. Instructions and counseling were provided regarding patient goals and barriers. He was counseled to adopt a healthy lifestyle. Educational resources and self-management tools  have been provided as charted in [facility] list.   . H/O maze procedure 03/29/2017  . [name] 03/29/2017   Overview:  2011  . Hx of CABG 03/29/2017  . Hyperlipidemia 03/29/2017  . Hypertensive heart disease 03/29/2017  . Kidney stones 07/23/2015   Overview:  x 3  Last Assessment & Plan:  By [place] has left nephrolithiasis, but not visible by KUB. Will check CT renal colic next year to assess both stone burden as well as to surveil AML.   . Leukocytoclastic vasculitis ([place]) 10/01/2017  . Localized edema 01/04/2018  . Lumbar radicular pain 01/19/2016  . Maculopapular  rash 09/03/2017  . Nephrolithiasis 07/23/2015   Overview:  x 3  Last Assessment & Plan:  By [place] has left nephrolithiasis, but not visible by KUB. Will check CT renal colic next year to assess both stone burden as well as to surveil AML.   Overview:  x 3  Last Assessment & Plan:  Has 28m nonobstructing LUP stone - not visible by KUB.  Will check renal [place]2019 - he will contact office sooner if symptomatic.   . Non-sustained ventricular tachycardia ([place] 03/29/2017  . Other hyperlipidemia 03/29/2017  . Palpitations 10/01/2017  . Paroxysmal atrial fibrillation ([place] 03/29/2017  . Paroxysmal atrial fibrillation ([place] 03/29/2017  . Pleural effusion, bilateral 08/09/2017  . Post-nasal drainage 02/25/2016   Last Assessment & Plan:  Trial zyrtec and flonase  . Prostate cancer screening 06/20/2017   Last Assessment & Plan:  Recommend continued annual CaP screening until within 10 years of life expectancy. Given good health and fhx of longevity, would anticipate CaP screening to continue until age 73  PSA today and again in one year on day of visit.  . Pulmonary hypertension ([place] 08/09/2017  . Pulmonary nodules 06/12/2016  . S/P AVR (aortic valve replacement) 03/20/2016  . Supratherapeutic INR 07/26/2017  . Syncope 03/29/2017  . Syncope and collapse 02/01/2016  . Typical atrial flutter ([place] 02/01/2016    Past Surgical History:  Procedure Laterality Date  . CHOLECYSTECTOMY    . CORONARY ARTERY BYPASS GRAFT    . EXPLORATORY LAPAROTOMY  07/30/2017  . FOOT SURGERY    . FRACTURE SURGERY Right    wrist and forearm  . HERNIA REPAIR    . MECHANICAL AORTIC VALVE REPLACEMENT    . NASAL SINUS SURGERY    . RIGHT HEART CATH N/A 01/18/2018   Procedure: RIGHT HEART CATH;  Surgeon: BJolaine Artist MD;  [facility];  Service: Cardiovascular;  Laterality: N/A;  . RIGHT HEART CATH N/A 05/14/2018   Procedure: RIGHT HEART CATH;  Surgeon: BJolaine Artist MD;  [facility];  Service:  Cardiovascular;  Laterality: N/A;  . TEE WITHOUT CARDIOVERSION N/A 05/21/2018   Procedure: TRANSESOPHAGEAL ECHOCARDIOGRAM (TEE);  Surgeon: BJolaine Artist MD;  [facility];  Service: Cardiovascular;  Laterality: N/A;  . UPPER GASTROINTESTINAL ENDOSCOPY  07/12/2017   Patchy areas of mucosal inflammation noted in the antrum with edema,erthema and ulcerations. Bx. Chronicfocally active gastritis.  .Marland KitchenVASECTOMY      Current Medications: Current Meds  Medication Sig  . acetaminophen (TYLENOL) 650 MG CR tablet Take 1 tablet (650 mg total) by mouth every 8 (eight) hours as needed for pain. Not to use with norco since combined with tylenol.  .Marland Kitchenaspirin EC 81 MG tablet Take 81 mg by mouth daily.  . B Complex-C (B-COMPLEX WITH VITAMIN C) tablet Take 1 tablet by mouth  daily.   . cetirizine (ZYRTEC ALLERGY) 10 MG tablet Take 10 mg by mouth daily as needed for allergies.   . Cholecalciferol (VITAMIN D) 125 MCG (5000 UT) CAPS Take 1 capsule by mouth daily.  . Coenzyme Q10 (COQ10 PO) Take 1 tablet by mouth daily.  . ferrous sulfate 325 (65 FE) MG EC tablet Take 1 tablet (325 mg total) by mouth 3 (three) times daily with meals.  . hydrocortisone (ANUSOL-HC) 2.5 % rectal cream APP REC AA BID FOR 10 DAYS  . macitentan (OPSUMIT) 10 MG tablet Take 1 tablet (10 mg total) by mouth daily.  . Magnesium Gluconate (MAGNESIUM 27 PO) Take 1 tablet by mouth daily.  . midodrine (PROAMATINE) 10 MG tablet Take 1 tablet (10 mg total) by mouth 3 (three) times daily with meals.  . potassium chloride 20 MEQ TBCR Take 40 mEq by mouth daily.  . rosuvastatin (CRESTOR) 5 MG tablet TAKE 1 TABLET(5 MG) BY MOUTH DAILY  . spironolactone (ALDACTONE) 25 MG tablet TAKE 1/2 OF A TABLET BY MOUTH ONCE DAILY.  . tadalafil, PAH, (ADCIRCA) 20 MG tablet TAKE 2 TABLETS BY MOUTH ONCE A DAY  . torsemide (DEMADEX) 20 MG tablet Take 3 tablets (60 mg total) by mouth daily.  [name] warfarin (COUMADIN) 5 MG tablet As directed by coumadin  clinic (Patient taking differently: daily. As directed by coumadin clinic)     Allergies:   Protonix [pantoprazole sodium], Amoxicillin-pot clavulanate, and Tape   Social History   Socioeconomic History  . Marital status: Single    Spouse name: Not on file  . Number of children: Not on file  . Years of education: Not on file  . Highest education level: Not on file  Occupational History  . Not on file  Social Needs  . Financial resource strain: Not on file  . Food insecurity    Worry: Not on file    Inability: Not on file  . Transportation needs    Medical: Not on file    Non-medical: Not on file  Tobacco Use  . Smoking status: Former Smoker    Packs/day: 2.00    Years: 34.00    Pack years: 68.00    Types: Cigarettes    Quit date: 07/16/2000    Years since quitting: 18.9  . Smokeless tobacco: Never Used  Substance and Sexual Activity  . Alcohol use: Not Currently  . Drug use: No  . Sexual activity: Not on file  Lifestyle  . Physical activity    Days per week: Not on file    Minutes per session: Not on file  . Stress: Not on file  Relationships  . Social Herbalist on phone: Not on file    Gets together: Not on file    Attends religious service: Not on file    Active member of club or organization: Not on file    Attends meetings of clubs or organizations: Not on file    Relationship status: Not on file  Other Topics Concern  . Not on file  Social History Narrative  . Not on file     Family History: The patient's family history includes Arthritis in his mother; Asthma in his mother; Heart attack in his father; Hypertension in his father; Stroke in his paternal grandmother. There is no history of Colon cancer. ROS:   Please see the history of present illness.    All other systems reviewed and are negative.  EKGs/Labs/Other Studies Reviewed:  The following studies were reviewed today:  Recent Labs: 06/06/2019: ALT 16; B Natriuretic Peptide  618.3; BUN 27; Creatinine, Ser 1.34; Potassium 3.4; Sodium 135 06/30/2019: Hemoglobin 10.7; Platelets 243.0  Recent Lipid Panel    Component Value Date/Time   CHOL 125 10/21/2018 1028   TRIG 57 10/21/2018 1028   HDL 43 10/21/2018 1028   CHOLHDL 2.9 10/21/2018 1028   VLDL 11 10/21/2018 1028   LDLCALC 71 10/21/2018 1028    Physical Exam:    VS:  BP 112/72   Pulse 64   Temp (!) 97.5 F (36.4 C)   Wt 141 lb (64 kg)   SpO2 97%   BMI 20.52 kg/m     VS reviewed, mood affect thougth perception are normal, AO3, no respiratory distress  Wt Readings from Last 3 Encounters:  07/01/19 141 lb (64 kg)  06/24/19 143 lb (64.9 kg)  06/12/19 143 lb 9.6 oz (65.1 kg)     Signed, Shirlee More, MD  07/01/2019 11:15 AM    [place]

## 2019-06-30 NOTE — Telephone Encounter (Signed)
Future cbc placed. Pleased get pt scheduled for repeat cbc in 6 weeks,

## 2019-07-01 ENCOUNTER — Ambulatory Visit: Payer: Medicare Other | Admitting: Cardiology

## 2019-07-01 ENCOUNTER — Ambulatory Visit (INDEPENDENT_AMBULATORY_CARE_PROVIDER_SITE_OTHER): Payer: Medicare Other | Admitting: *Deleted

## 2019-07-01 ENCOUNTER — Telehealth (INDEPENDENT_AMBULATORY_CARE_PROVIDER_SITE_OTHER): Payer: Medicare Other | Admitting: Cardiology

## 2019-07-01 ENCOUNTER — Encounter: Payer: Self-pay | Admitting: Cardiology

## 2019-07-01 VITALS — BP 112/72 | HR 64 | Temp 97.5°F | Wt 141.0 lb

## 2019-07-01 DIAGNOSIS — I9589 Other hypotension: Secondary | ICD-10-CM | POA: Diagnosis not present

## 2019-07-01 DIAGNOSIS — I4892 Unspecified atrial flutter: Secondary | ICD-10-CM | POA: Diagnosis not present

## 2019-07-01 DIAGNOSIS — K7460 Unspecified cirrhosis of liver: Secondary | ICD-10-CM

## 2019-07-01 DIAGNOSIS — E876 Hypokalemia: Secondary | ICD-10-CM

## 2019-07-01 DIAGNOSIS — Z952 Presence of prosthetic heart valve: Secondary | ICD-10-CM | POA: Diagnosis not present

## 2019-07-01 DIAGNOSIS — E782 Mixed hyperlipidemia: Secondary | ICD-10-CM | POA: Diagnosis not present

## 2019-07-01 DIAGNOSIS — I2721 Secondary pulmonary arterial hypertension: Secondary | ICD-10-CM | POA: Diagnosis not present

## 2019-07-01 DIAGNOSIS — D649 Anemia, unspecified: Secondary | ICD-10-CM

## 2019-07-01 DIAGNOSIS — Z5181 Encounter for therapeutic drug level monitoring: Secondary | ICD-10-CM | POA: Diagnosis not present

## 2019-07-01 DIAGNOSIS — Z7901 Long term (current) use of anticoagulants: Secondary | ICD-10-CM

## 2019-07-01 DIAGNOSIS — I130 Hypertensive heart and chronic kidney disease with heart failure and stage 1 through stage 4 chronic kidney disease, or unspecified chronic kidney disease: Secondary | ICD-10-CM

## 2019-07-01 HISTORY — DX: Anemia, unspecified: D64.9

## 2019-07-01 HISTORY — DX: Unspecified cirrhosis of liver: K74.60

## 2019-07-01 LAB — POCT INR: INR: 2.9 (ref 2.0–3.0)

## 2019-07-01 NOTE — Patient Instructions (Addendum)
Medication Instructions:  Your physician recommends that you continue on your current medications as directed. Please refer to the Current Medication list given to you today.  If you need a refill on your cardiac medications before your next appointment, please call your pharmacy.   Lab work: None  If you have labs (blood work) drawn today and your tests are completely normal, you will receive your results only by: [name] MyChart Message (if you have MyChart) OR . A paper copy in the mail If you have any lab test that is abnormal or we need to change your treatment, we will call you to review the results.  Testing/Procedures: None  Follow-Up: At [facility], you and your health needs are our priority.  As part of our continuing mission to provide you with exceptional heart care, we have created designated Provider Care Teams.  These Care Teams include your primary Cardiologist (physician) and Advanced Practice Providers (APPs -  Physician Assistants and Nurse Practitioners) who all work together to provide you with the care you need, when you need it. You will need a follow up appointment in 3 months: Monday, 10/06/2019, at 10:40 am in the [place] office.

## 2019-07-01 NOTE — Patient Instructions (Signed)
Description   Continue 2.18m daily except 540mon Sundays and Thursdays.  Please call our office with any medication changes or concerns [phone]. Return in 5 weeks for INR check.

## 2019-07-02 NOTE — Progress Notes (Signed)
20 minutes was spent with the patient during this virtual interaction.

## 2019-07-10 ENCOUNTER — Other Ambulatory Visit: Payer: Self-pay | Admitting: Family Medicine

## 2019-07-10 ENCOUNTER — Other Ambulatory Visit: Payer: Self-pay

## 2019-07-10 ENCOUNTER — Encounter (HOSPITAL_COMMUNITY): Payer: Self-pay | Admitting: Internal Medicine

## 2019-07-10 ENCOUNTER — Ambulatory Visit (HOSPITAL_COMMUNITY)
Admission: RE | Admit: 2019-07-10 | Discharge: 2019-07-10 | Disposition: A | Discharge status: Home / Self Care | Payer: Medicare Other | Source: Ambulatory Visit | Attending: Internal Medicine | Admitting: Internal Medicine

## 2019-07-10 VITALS — BP 133/79 | HR 74 | Wt 149.4 lb

## 2019-07-10 DIAGNOSIS — K7460 Unspecified cirrhosis of liver: Secondary | ICD-10-CM | POA: Diagnosis not present

## 2019-07-10 DIAGNOSIS — Z825 Family history of asthma and other chronic lower respiratory diseases: Secondary | ICD-10-CM | POA: Insufficient documentation

## 2019-07-10 DIAGNOSIS — Z79899 Other long term (current) drug therapy: Secondary | ICD-10-CM | POA: Insufficient documentation

## 2019-07-10 DIAGNOSIS — I119 Hypertensive heart disease without heart failure: Secondary | ICD-10-CM | POA: Diagnosis not present

## 2019-07-10 DIAGNOSIS — I2510 Atherosclerotic heart disease of native coronary artery without angina pectoris: Secondary | ICD-10-CM | POA: Diagnosis not present

## 2019-07-10 DIAGNOSIS — Z8249 Family history of ischemic heart disease and other diseases of the circulatory system: Secondary | ICD-10-CM | POA: Insufficient documentation

## 2019-07-10 DIAGNOSIS — Z888 Allergy status to other drugs, medicaments and biological substances status: Secondary | ICD-10-CM | POA: Diagnosis not present

## 2019-07-10 DIAGNOSIS — Z87891 Personal history of nicotine dependence: Secondary | ICD-10-CM | POA: Diagnosis not present

## 2019-07-10 DIAGNOSIS — I480 Paroxysmal atrial fibrillation: Secondary | ICD-10-CM | POA: Insufficient documentation

## 2019-07-10 DIAGNOSIS — Z8261 Family history of arthritis: Secondary | ICD-10-CM | POA: Diagnosis not present

## 2019-07-10 DIAGNOSIS — Z87442 Personal history of urinary calculi: Secondary | ICD-10-CM | POA: Insufficient documentation

## 2019-07-10 DIAGNOSIS — Z951 Presence of aortocoronary bypass graft: Secondary | ICD-10-CM | POA: Diagnosis not present

## 2019-07-10 DIAGNOSIS — Z952 Presence of prosthetic heart valve: Secondary | ICD-10-CM

## 2019-07-10 DIAGNOSIS — Z9049 Acquired absence of other specified parts of digestive tract: Secondary | ICD-10-CM | POA: Diagnosis not present

## 2019-07-10 DIAGNOSIS — J449 Chronic obstructive pulmonary disease, unspecified: Secondary | ICD-10-CM | POA: Diagnosis not present

## 2019-07-10 DIAGNOSIS — I272 Pulmonary hypertension, unspecified: Secondary | ICD-10-CM | POA: Diagnosis not present

## 2019-07-10 DIAGNOSIS — R188 Other ascites: Secondary | ICD-10-CM | POA: Insufficient documentation

## 2019-07-10 DIAGNOSIS — Z7982 Long term (current) use of aspirin: Secondary | ICD-10-CM | POA: Insufficient documentation

## 2019-07-10 DIAGNOSIS — D509 Iron deficiency anemia, unspecified: Secondary | ICD-10-CM | POA: Diagnosis not present

## 2019-07-10 DIAGNOSIS — I2781 Cor pulmonale (chronic): Secondary | ICD-10-CM | POA: Diagnosis not present

## 2019-07-10 DIAGNOSIS — J9610 Chronic respiratory failure, unspecified whether with hypoxia or hypercapnia: Secondary | ICD-10-CM | POA: Diagnosis not present

## 2019-07-10 DIAGNOSIS — I2721 Secondary pulmonary arterial hypertension: Secondary | ICD-10-CM | POA: Diagnosis not present

## 2019-07-10 DIAGNOSIS — I4892 Unspecified atrial flutter: Secondary | ICD-10-CM

## 2019-07-10 DIAGNOSIS — Z7901 Long term (current) use of anticoagulants: Secondary | ICD-10-CM | POA: Diagnosis not present

## 2019-07-10 DIAGNOSIS — E7849 Other hyperlipidemia: Secondary | ICD-10-CM | POA: Insufficient documentation

## 2019-07-10 DIAGNOSIS — Z880 Allergy status to penicillin: Secondary | ICD-10-CM | POA: Diagnosis not present

## 2019-07-10 LAB — BASIC METABOLIC PANEL
Anion gap: 13 (ref 5–15)
BUN: 20 mg/dL (ref 8–23)
CO2: 22 mmol/L (ref 22–32)
Calcium: 9.5 mg/dL (ref 8.9–10.3)
Chloride: 102 mmol/L (ref 98–111)
Creatinine, Ser: 1.38 mg/dL — ABNORMAL HIGH (ref 0.61–1.24)
GFR calc Af Amer: 59 mL/min — ABNORMAL LOW (ref >60)
GFR calc non Af Amer: 51 mL/min — ABNORMAL LOW (ref >60)
Glucose, Bld: 87 mg/dL (ref 70–99)
Potassium: 3.4 mmol/L — ABNORMAL LOW (ref 3.5–5.1)
Sodium: 137 mmol/L (ref 135–145)

## 2019-07-10 NOTE — Patient Instructions (Signed)
Lab work done today. We will notify you of any abnormal lab work. No news is good news.  Your physician has requested that you have an echocardiogram. Echocardiography is a painless test that uses sound waves to create images of your heart. It provides your doctor with information about the size and shape of your heart and how well your heart's chambers and valves are working. This procedure takes approximately one hour. There are no restrictions for this procedure. This will be done at your next follow up visit.  Please follow up with the [facility] in 6 months with an echocardiogram.  At the [facility], you and your health needs are our priority. As part of our continuing mission to provide you with exceptional heart care, we have created designated Provider Care Teams. These Care Teams include your primary Cardiologist (physician) and Advanced Practice Providers (APPs- Physician Assistants and Nurse Practitioners) who all work together to provide you with the care you need, when you need it.   You may see any of the following providers on your designated Care Team at your next follow up: [name] Dr Glori Bickers . Dr Loralie Champagne . Darrick Grinder, NP

## 2019-07-10 NOTE — Addendum Note (Signed)
Encounter addended by: Marlise Eves, RN on: 07/10/2019 12:23 PM  Actions taken: Order list changed, Diagnosis association updated, Charge Capture section accepted, Clinical Note Signed

## 2019-07-10 NOTE — Progress Notes (Signed)
ADVANCED HF CLINIC NOTE   Date:  07/10/2019   ID:  Lance Hicks, DOB Sep 09, 1946, MRN [id number]  PCP:  [name], PA-C  Cardiologist:  Dr. 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 Also has COPD (quit smoking in 2002 - 1 ppd x 25 years), HL, and HTN.  In 8/18 admitted for acute pancreatitis. Underwent lap cholecystectomy on 07/29/2017. On 8/6 hedeveloped hypotension and was taken back for open laparotomy due to bleeding from an unclear omental source.He was given fluids and 8 units PRBC perioperatively. He says his breathing has been bad since that time.   Seen at [place] in 2018 had hi-res CT, PFTs and VQ (low prob). Felt to have Golds I COPD with asthma overlap and early pulmonary fibrosis with PAH. Thought felt pulmonary pressures may have been elevated due to recent illness. Referred to Rheumatology though appt not until May. Suggested possible RHC.   He was also seen by Cardiology. Echocardiogram 8/18 as below showed normal LV function with septal flattening and RVSP 65-14mHG. There was no comment on RV size or function. 14-day event monitor showed 10-beat run NSVT. Felt to have chronic AF and PAH. No further testing ordered.  Underwent RHC on 01/18/18 with severe PAH and cor pulmonale. Macitentan and Adcirca.   Admitted 5/21-5/30/19 with symptomatic hypotension and weight gain. He had a repeat RHC and echo with bubble study (results below). He was started on midodrine for BP support. Diuresed 25 lbs with IV lasix, then transitioned to torsemide 60 mg daily. Abdominal UKoreashowed cirrhosis with no evidence of portal HTN. Pulm consulted for possible ILD on High-res chest CT. Ortho consulted for right rotator cuff tear from horse  accident PTA. He was treated with antibiotics for Bacteremia. DC weight: 140 lbs.   He presents today for regular follow up. Remains on Adcirca and macitentan. He was unable to tolerate Uptravi due to diarrhea. We saw him last month and was volume overloaded. We increased torsemide to 60 daily. Now taking torsemide 40 daily alternating with 60 daily. Weight down from 147 to 138. Feels much better. Denies edema, orthopnea or PND. Says he is still anemic. Got IV iron again 1.5 weeks ago. Continues to exercise. No dizziness. Remains on midodrine. Remains on warfarin.   Review of systems complete and found to be negative unless listed in HPI.   Studies:  RHC 5/21/19showed Mild/Moderate PAH in setting of high output with no evidence of intracardiac shunting. Hemodynamics as below.   High-Res Chest CT 05/16/18 1. Slightly irregular 0.9 cm peripheral right middle lobe solid pulmonary nodule. 3 month follow up recommended.  2. Dependent basilar predominant patchy subpleural reticulation and ground-glass attenuation with the suggestion of minimal associated traction bronchiolectasis. No frank honeycombing. Findings may indicate an interstitial lung disease such as early usual interstitial pneumonia (UIP) or fibrotic phase nonspecific interstitial pneumonia (NSIP). Suggest a follow-up high-resolution chest CT study in 6-12 months to assess temporal pattern stability, as clinically warranted. 3. Mild cardiomegaly. Minimal interlobular septal thickening and trace dependent bilateral pleural effusions.  4. Small to moderate volume perihepatic ascites. 5. Ectatic 4.3 cm ascending thoracic aorta. Recommend annual imaging followup by CTA or MRA.  6. Left main and  3 vessel coronary atherosclerosis.  US Abdomen RUQ 05/16/18 - s/p cholecystectomy. + hepatic cirrhosis. Mild ascites.   Liver Doppler 05/16/18 - No hepatic, splenic, or portal venous thrombosis or occlusion. Mild ascites.  Echo with  bubble study 05/16/18 LVEF 55-60% with "late bubbles" , Mechanical AVR stable with trivial perivalvular regurg, Mild MR, Severe LAE, Severe RV dilation and reduced function. No PFO. Mod TR, PA peak pressure 68 mm Hg  [place] numbers5/24/19: PA 60-19 (32) CVP ~10 CO 5.4 CI 2.9  RHC 05/14/18 RA = 18 RV = 71/17 PA = 69/24 (39) PCW = 20 Fick cardiac output/index = 7.0/3.7 Thermo CO/CI = 7.2/3.8 PVR = 2.6 WU Ao sat = 97% PA sat = 73%, 73% SVC sat = 73%  RA sat = 72%  RHC 01/18/18 RA = 23 RV = 84/21 PA = 81/38 (53) PCW = 22 Fick cardiac output/index = 3.0/1.6 PVR = 10.4 WU FA sat = 98% on 2L  (checked on RA = 93%) PA sat = 60%, 61% SVC sat = 62%   Echo 8/18  1. LVEF is estimated to be 55%. 2. There is septal flattening consistent with right ventricular pressure and or volume overload. 3. A mechanical prosthetic aortic valve is present with mild AI 4. There is moderate to severe tricuspid regurgitation. 5. There is a trivial amount of pulmonic regurgitation. 6. The right ventricular systolic pressure is calculated at 65-17mHg. 7. The inferior vena cava is dilated with <50% inspiratory collapse, suggestive of an elevated right atrial pressure (159mg).  14 Day Patch 10/01/2017-10/15/2017:  -1 run of Ventricular Tachycardia occurred lasting 10 beats with a max rate of 240 bpm (avg 161 bpm). -Atrial Flutter occurred continuously (100% burden), ranging from 55-132 bpm (avg of 87 bpm).   NM Ventilation and Perfusion Lung Scan 10/31/2017:  Low probability of pulmonary embolism, using the PIOPED criteria.  Lower extremity venous duplex 07/25/2017:  1. Low probability of acute deep vein thrombosis in bilateral lower extremities. 2. Calf veins are poorly visualized but appear patent in limited segments visualized.   PFTs 07/02/2017:  FVC86% FEV185% FEV1/FVC 73% DLCO8.2, 32%   6MW -1400 feet; 97% to 85% (recovered with  resting)   CT Chest07/08/2017: 1. Stable right middle lobe pulmonary nodule measuring up to 1 cm. 2. Minimal subpleural reticulation without evidence of honeycombing to  suggest pulmonary fibrosis. Stable centrilobular emphysema. 3. Mediastinal lymphadenopathy, increased from prior. 4. Calcified right hilar lymph nodes and multiple calcified granulomas  throughout the right lung consistent with prior granulomatous disease. 5. Ascending thoracic aortic aneurysm measuring up to 4.5 cm, unchanged. 6. Dilatation of the main pulmonary artery suggestive of pulmonary  hypertension. 7. Cardiomegaly and dense atherosclerotic coronary artery calcifications.    Past Medical History:  Diagnosis Date  . 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 In light of concurrent left nephrolithiasis, will check CT renal colic next year instead of renal [place]  . Marland Kitchennticoagulated on Coumadin 01/04/2018  . Anxiety 05/10/2016   Last Assessment & Plan:  Doing well off of zoloft.  . Asthma   . Asymptomatic microscopic hematuria 06/20/2017   Last Assessment & Plan:  [facility] and [facility]. UA with 2+ blood last visit - we discussed recommendation for repeat workup at 5 years or if degree of hematuria progresses.   . Atrial fibrillation (HCGang Mills4/04/2017  . Atrial flutter (HCL[place] S[place]4/04/2017  . Chronic  allergic rhinitis 04/28/2016   Last Assessment & Plan:  Continue astelin  . Chronic anticoagulation 03/29/2017  . Chronic atrial fibrillation 07/23/2015   Last Assessment & Plan:  Coumadin and metoprolol, cardiology referral to establish care.  . Chronic midline back pain 12/14/2015   Last Assessment & Plan:  Pain management referral for further evaluation.  . Chronic prostatitis 07/23/2015   Last Assessment & Plan:  Has largely resolved since stopping bike riding. Recommend annual DRE AND PSA -  will see back 12/2015 for annual screening, given 1st degree fhx. To call office for recurrent prostatitis symptoms.   [name] COPD (chronic obstructive pulmonary disease) ([place]) 06/12/2016  . Coronary artery disease involving native coronary artery of native heart with angina pectoris ([place]) 03/29/2017  . Cough 10/17/2016   Last Assessment & Plan:  Discussed typical course for acute viral illness. If symptoms worsen or fail to improve by 7-10d, delayed ATBs, fluids, rest, NSAIDs/APAP prn. Seek care if not improving. Needs earlier INR check due to ATBs.  [name] Dyspnea 02/01/2016   Last Assessment & Plan:  Overall improving, eval by pulm, plan for CT, neg stress test with cardiology. Recent switch to carvedilol due to side effects.  . Epidermoid cyst of skin 08/24/2017  . Essential hypertension 12/14/2015   Last Assessment & Plan:  Hypertension control: controlled  Medications: compliant Medication Management: as noted in orders Home blood pressure monitoring recommended additionally as needed for symptoms  The patient's care plan was reviewed and updated. Instructions and counseling were provided regarding patient goals and barriers. He was counseled to adopt a healthy lifestyle. Educational resources and self-management tools have been provided as charted in [facility] list.   . H/O maze procedure 03/29/2017  . [name] 03/29/2017   Overview:  2011  . Hx of CABG 03/29/2017  . Hyperlipidemia 03/29/2017  . Hypertensive heart disease 03/29/2017  . Kidney stones 07/23/2015   Overview:  x 3  Last Assessment & Plan:  By [place] has left nephrolithiasis, but not visible by KUB. Will check CT renal colic next year to assess both stone burden as well as to surveil AML.   . Leukocytoclastic vasculitis ([place]) 10/01/2017  . Localized edema 01/04/2018  . Lumbar radicular pain 01/19/2016  . Maculopapular rash 09/03/2017  . Nephrolithiasis 07/23/2015   Overview:  x 3  Last Assessment & Plan:  By [place] has left nephrolithiasis,  but not visible by KUB. Will check CT renal colic next year to assess both stone burden as well as to surveil AML.   Overview:  x 3  Last Assessment & Plan:  Has 76m nonobstructing LUP stone - not visible by KUB.  Will check renal [place]8/2019 - he will contact office sooner if symptomatic.   . Non-sustained ventricular tachycardia ([place] 03/29/2017  . Other hyperlipidemia 03/29/2017  . Palpitations 10/01/2017  . Paroxysmal atrial fibrillation ([place] 03/29/2017  . Paroxysmal atrial fibrillation (HGal[place] 03/29/2017  . Pleural effusion, bilateral 08/09/2017  . Post-nasal drainage 02/25/2016   Last Assessment & Plan:  Trial zyrtec and flonase  . Prostate cancer screening 06/20/2017   Last Assessment & Plan:  Recommend continued annual CaP screening until within 10 years of life expectancy. Given good health and fhx of longevity, would anticipate CaP screening to continue until age 73  PSA today and again in one year on day of visit.  . Pulmonary hypertension (HLevasy 08/09/2017  . Pulmonary nodules 06/12/2016  . S/P AVR (aortic valve replacement) 03/20/2016  . Supratherapeutic  INR 07/26/2017  . Syncope 03/29/2017  . Syncope and collapse 02/01/2016  . Typical atrial flutter ([place]) 02/01/2016    Past Surgical History:  Procedure Laterality Date  . CHOLECYSTECTOMY    . CORONARY ARTERY BYPASS GRAFT    . EXPLORATORY LAPAROTOMY  07/30/2017  . FOOT SURGERY    . FRACTURE SURGERY Right    wrist and forearm  . HERNIA REPAIR    . MECHANICAL AORTIC VALVE REPLACEMENT    . NASAL SINUS SURGERY    . RIGHT HEART CATH N/A 01/18/2018   Procedure: RIGHT HEART CATH;  Surgeon: Jolaine Artist, MD;  Location: [facility];  Service: Cardiovascular;  Laterality: N/A;  . RIGHT HEART CATH N/A 05/14/2018   Procedure: RIGHT HEART CATH;  Surgeon: Jolaine Artist, MD;  Location: [facility];  Service: Cardiovascular;  Laterality: N/A;  . TEE WITHOUT CARDIOVERSION N/A 05/21/2018   Procedure: TRANSESOPHAGEAL ECHOCARDIOGRAM (TEE);   Surgeon: Jolaine Artist, MD;  Location: [facility]COPY;  Service: Cardiovascular;  Laterality: N/A;  . UPPER GASTROINTESTINAL ENDOSCOPY  07/12/2017   Patchy areas of mucosal inflammation noted in the antrum with edema,erthema and ulcerations. Bx. Chronicfocally active gastritis.  [name] VASECTOMY      Current Medications: Current Meds  Medication Sig  . acetaminophen (TYLENOL) 650 MG CR tablet Take 1 tablet (650 mg total) by mouth every 8 (eight) hours as needed for pain. Not to use with norco since combined with tylenol.  [name] aspirin EC 81 MG tablet Take 81 mg by mouth daily.  . B Complex-C (B-COMPLEX WITH VITAMIN C) tablet Take 1 tablet by mouth daily.   . cetirizine (ZYRTEC ALLERGY) 10 MG tablet Take 10 mg by mouth daily as needed for allergies.   . Cholecalciferol (VITAMIN D) 125 MCG (5000 UT) CAPS Take 1 capsule by mouth daily.  . Coenzyme Q10 (COQ10 PO) Take 1 tablet by mouth daily.  . ferrous sulfate 325 (65 FE) MG EC tablet Take 1 tablet (325 mg total) by mouth 3 (three) times daily with meals.  . hydrocortisone (ANUSOL-HC) 2.5 % rectal cream APP REC AA BID FOR 10 DAYS  . macitentan (OPSUMIT) 10 MG tablet Take 1 tablet (10 mg total) by mouth daily.  . Magnesium Gluconate (MAGNESIUM 27 PO) Take 1 tablet by mouth daily.  . midodrine (PROAMATINE) 10 MG tablet Take 1 tablet (10 mg total) by mouth 3 (three) times daily with meals.  . potassium chloride 20 MEQ TBCR Take 40 mEq by mouth daily.  . rosuvastatin (CRESTOR) 5 MG tablet TAKE 1 TABLET(5 MG) BY MOUTH DAILY  . spironolactone (ALDACTONE) 25 MG tablet TAKE 1/2 OF A TABLET BY MOUTH ONCE DAILY.  . tadalafil, PAH, (ADCIRCA) 20 MG tablet TAKE 2 TABLETS BY MOUTH ONCE A DAY  . torsemide (DEMADEX) 20 MG tablet Take 3 tablets (60 mg total) by mouth daily.  [name] warfarin (COUMADIN) 5 MG tablet As directed by coumadin clinic (Patient taking differently: daily. As directed by coumadin clinic)     Allergies:   Protonix [pantoprazole sodium],  Amoxicillin-pot clavulanate, and Tape   Social History   Socioeconomic History  . Marital status: Single    Spouse name: Not on file  . Number of children: Not on file  . Years of education: Not on file  . Highest education level: Not on file  Occupational History  . Not on file  Social Needs  . Financial resource strain: Not on file  . Food insecurity    Worry:  Not on file    Inability: Not on file  . Transportation needs    Medical: Not on file    Non-medical: Not on file  Tobacco Use  . Smoking status: Former Smoker    Packs/day: 2.00    Years: 34.00    Pack years: 68.00    Types: Cigarettes    Quit date: 07/16/2000    Years since quitting: 18.9  . Smokeless tobacco: Never Used  Substance and Sexual Activity  . Alcohol use: Not Currently  . Drug use: No  . Sexual activity: Not on file  Lifestyle  . Physical activity    Days per week: Not on file    Minutes per session: Not on file  . Stress: Not on file  Relationships  . Social Herbalist on phone: Not on file    Gets together: Not on file    Attends religious service: Not on file    Active member of club or organization: Not on file    Attends meetings of clubs or organizations: Not on file    Relationship status: Not on file  Other Topics Concern  . Not on file  Social History Narrative  . Not on file     Family History: The patient's family history includes Arthritis in his mother; Asthma in his mother; Heart attack in his father; Hypertension in his father; Stroke in his paternal grandmother. There is no history of Colon cancer.   Recent Labs: 06/06/2019: ALT 16; B Natriuretic Peptide 618.3; BUN 27; Creatinine, Ser 1.34; Potassium 3.4; Sodium 135 06/30/2019: Hemoglobin 10.7; Platelets 243.0  Recent Lipid Panel    Component Value Date/Time   CHOL 125 10/21/2018 1028   TRIG 57 10/21/2018 1028   HDL 43 10/21/2018 1028   CHOLHDL 2.9 10/21/2018 1028   VLDL 11 10/21/2018 1028   LDLCALC 71  10/21/2018 1028    Physical Exam:    VS:  BP 133/79   Pulse 74   Wt 67.8 kg (149 lb 6.4 oz)   SpO2 94%   BMI 21.75 kg/m     Wt Readings from Last 3 Encounters:  07/10/19 67.8 kg (149 lb 6.4 oz)  07/01/19 64 kg (141 lb)  06/24/19 64.9 kg (143 lb)    General:  Well appearing. No resp difficulty HEENT: normal Neck: supple. JVP7 . Carotids 2+ bilat; no bruits. No lymphadenopathy or thryomegaly appreciated. Cor: PMI nondisplaced. Irr. IRR mechanical s2.  2/^ AS Lungs: clear Abdomen: soft, nontender, nondistended. No hepatosplenomegaly. No bruits or masses. Good bowel sounds. Extremities: no cyanosis, clubbing, rash, edema Neuro: alert & orientedx3, cranial nerves grossly intact. moves all 4 extremities w/o difficulty. Affect pleasant   ASSESSMENT/PLAN:    1. Pulmonary hypertension with cor pulmonale/RV failure - WHO Group I  -? Component ofHHT/shunt/AVMs with late bubbles on bubble study.  - Echo with bubble study 05/16/18 LVEF 55-60% with "late bubbles" , Mechanical AVR stable with trivial perivalvular regurg, Mild MR, Severe LAE, Severe RV dilation and reduced function. No PFO. Mod TR, PA peak pressure 68 mm Hg - Echo 10/19: EF 55-60% mild LVH; s/p AVR with mean gradient 11 mmHg and trace AI; mild MR; severe biatrial enlargement; mild RVE; severe TR; severe pulmonary hypertension. RVSP 33mHG - RHC 1/19 with mod/severe pulm HTN with RV failure.  - RHC 05/14/18 with Mild/Moderate PAH in setting of high output with no evidence of intracardiac shunting. - Ab u/s with cirrhosis but no evidence of portal HTN - Improved  with Macitentan and Adcirca. Unable to tolerate Uptravi due to diarrhea - NYHA II-III. Volume status much improved. Continue torsemide 40 daily alternating with 60 daily - Continue spiro 12.5 mg daily. - Continue midodrine 10 mg am and afternoon, 15 mg at night - Auto-immune serologies negative (checked twice) - Check labs today - F/u 6 months with echo. Consider  repeat RHC soon    2. Chronic respiratory failure - [place] CT 5/23/19with "dependent basilar predominant patchy subpleural reticulation and ground-glass attenuation with the suggestion of minimal associated traction bronchiolectasis. No frank honeycombing. Findings may indicate an interstitial lung disease such as early usual interstitial pneumonia (UIP) or fibrotic phase nonspecific interstitial pneumonia (NSIP)." -Followed by Dr. Lamonte Sakai  3. Chronic AFL -Rate controlled.  Now off metoprolol. Continue coumadin with mechanical AVR. No bleeding issues.   4. CAD - s/p CABG 07/2017 with Dr Roderic Palau in [place].  - No s/s ischemia - continue ASA.  - Unable to tolerate atorva due to myalgias.  -ContinueCrestor 79m daily  5. S/p mechanical AVR - stable on most recent echo. Continue coumadin/ASA 81. INR goal is 2.5-3 per Dr MBettina Gavia - aware of need for SBE prophylaxis  6. Leukocytoclastic vasculitis - f/u with Rheumatology. No change.  7. Cirrhosis - UKoreaAbdomen RUQ 05/16/18 - s/p cholecystectomy. + hepatic cirrhosis. Mild ascites.  - Liver Doppler 05/16/18 - No hepatic, splenic, or portal venous thrombosis or occlusion. Mild ascites. - Continue midodrine 10 mg am, afternoon, 15 mg HS  8. Iron-def anemia - continue iron effusions   DGlori Bickers MD  12:04 PM

## 2019-07-17 ENCOUNTER — Ambulatory Visit: Payer: Medicare Other | Admitting: Emergency Medicine

## 2019-07-28 ENCOUNTER — Other Ambulatory Visit (HOSPITAL_COMMUNITY): Payer: Self-pay | Admitting: Internal Medicine

## 2019-07-29 ENCOUNTER — Ambulatory Visit (INDEPENDENT_AMBULATORY_CARE_PROVIDER_SITE_OTHER): Payer: Medicare Other | Admitting: Emergency Medicine

## 2019-07-29 ENCOUNTER — Other Ambulatory Visit: Payer: Self-pay

## 2019-07-29 ENCOUNTER — Encounter: Payer: Self-pay | Admitting: Emergency Medicine

## 2019-07-29 DIAGNOSIS — I2510 Atherosclerotic heart disease of native coronary artery without angina pectoris: Secondary | ICD-10-CM | POA: Diagnosis not present

## 2019-07-29 DIAGNOSIS — J449 Chronic obstructive pulmonary disease, unspecified: Secondary | ICD-10-CM

## 2019-07-29 DIAGNOSIS — I272 Pulmonary hypertension, unspecified: Secondary | ICD-10-CM | POA: Diagnosis not present

## 2019-07-29 DIAGNOSIS — J9611 Chronic respiratory failure with hypoxia: Secondary | ICD-10-CM | POA: Insufficient documentation

## 2019-07-29 HISTORY — DX: Chronic respiratory failure with hypoxia: J96.11

## 2019-07-29 MED ORDER — ALBUTEROL SULFATE HFA 108 (90 BASE) MCG/ACT IN AERS: 2.0000 | INHALATION_SPRAY | RESPIRATORY_TRACT | 5 refills | Status: DC | PRN

## 2019-07-29 NOTE — Assessment & Plan Note (Signed)
Minimal oxygen use but he notes that his oxygen saturations are typically greater than 90%.  Discussed our oxygenation goals with him today.

## 2019-07-29 NOTE — Patient Instructions (Signed)
We will refill your albuterol today.[name] Keep albuterol available to use 2 puffs up to every 4 hours if needed for shortness of breath, chest tightness, wheezing.  Please keep your oxygen available to use as needed with exertion.  Our goal is to keep your oxygen saturations greater than 90%. Please continue your pulmonary hypertension medications as directed by Dr. Haroldine Laws You need a repeat CT scan of your chest in June 2021 to follow your pulmonary nodule. Get the flu shot in the fall Follow with Dr Lamonte Sakai in June 2021 after your CT scan to review the results together.

## 2019-07-29 NOTE — Assessment & Plan Note (Signed)
Again I contemplated starting him on scheduled bronchodilator therapy.  But he notes very minimal symptoms, good exertional tolerance, only occasional albuterol use.  He is comfortable using albuterol only as needed.  We will refill this for him today.

## 2019-07-29 NOTE — Progress Notes (Signed)
   Subjective:    Patient ID: Lance Hicks, male    DOB: 1946-02-09, 73 y.o.   MRN: [id number]  HPI  ROV 07/29/2019 --annual follow-up for 73 year old gentleman with COPD, superimposed restrictive disease, hypoxemic respiratory failure.  He has secondary pulmonary hypertension due to this and also left-sided heart failure.  He is been on macitentan, tadalafil, diuretics, coumadin.  Oxygen is at We have been following a 9 mm right middle lobe pulmonary nodule, most recent CT scan of the chest done on 03/10/2019 which shows interval stability.  Recommendation made for one final repeat CT in June 2021. He denies any dyspnea except when the barometric pressure changes. He has good exertional tolerance. He does not have any albuterol right now. No flares. No wheeze. He has am sinus drainage with some cough. He does not use his supplemental O2 except for prn. He checks his SpO2, no desaturations noted even w exertion.     Review of Systems  Constitutional: Negative for fever and unexpected weight change.  HENT: Negative for congestion, dental problem, ear pain, nosebleeds, postnasal drip, rhinorrhea, sinus pressure, sneezing, sore throat and trouble swallowing.   Eyes: Negative for redness and itching.  Respiratory: Positive for shortness of breath. Negative for cough, chest tightness and wheezing.   Cardiovascular: Negative for palpitations and leg swelling.  Gastrointestinal: Negative for nausea and vomiting.  Genitourinary: Negative for dysuria.  Musculoskeletal: Negative for joint swelling.  Skin: Negative for rash.  Neurological: Negative for headaches.  Hematological: Does not bruise/bleed easily.  Psychiatric/Behavioral: Negative for dysphoric mood. The patient is not nervous/anxious.    was a Higher education careers adviser, has lived in [place], [place], [place] Was in [place], was in [place], no other inhaled exposures.  Has been exposed to paint fumes in the past     Objective:   Physical Exam Vitals:   07/29/19 1106  BP: 124/72  Pulse: 78  SpO2: 97%  Weight: 152 lb (68.9 kg)  Height: 5' 9.5" (1.765 m)   Gen: Pleasant, well-nourished, in no distress,  normal affect  ENT: No lesions,  mouth clear,  oropharynx clear, no postnasal drip  Neck: No JVD, no stridor  Lungs: No use of accessory muscles, no wheeze or crackles  Cardiovascular: RRR, heart sounds normal, no murmur or gallops, 1+ LE peripheral edema  Musculoskeletal: No deformities, no cyanosis or clubbing  Neuro: alert, non focal  Skin: Warm, no lesions or rash    Assessment & Plan:  COPD (chronic obstructive pulmonary disease) ([place]) Again I contemplated starting him on scheduled bronchodilator therapy.  But he notes very minimal symptoms, good exertional tolerance, only occasional albuterol use.  He is comfortable using albuterol only as needed.  We will refill this for him today.  Pulmonary hypertension ([place]) Secondary pulmonary hypertension, tolerating macitentan and tadalafil, Coumadin.  He follows with cardiology.  Chronic respiratory failure with hypoxia (HCC) Minimal oxygen use but he notes that his oxygen saturations are typically greater than 90%.  Discussed our oxygenation goals with him today.  Baltazar Apo, MD, PhD 07/29/2019, 11:34 AM [facility] [phone] or if no answer [phone]

## 2019-07-29 NOTE — Assessment & Plan Note (Signed)
Secondary pulmonary hypertension, tolerating macitentan and tadalafil, Coumadin.  He follows with cardiology.

## 2019-08-05 ENCOUNTER — Ambulatory Visit (INDEPENDENT_AMBULATORY_CARE_PROVIDER_SITE_OTHER): Payer: Medicare Other | Admitting: *Deleted

## 2019-08-05 DIAGNOSIS — I4892 Unspecified atrial flutter: Secondary | ICD-10-CM

## 2019-08-05 DIAGNOSIS — Z5181 Encounter for therapeutic drug level monitoring: Secondary | ICD-10-CM

## 2019-08-05 DIAGNOSIS — Z7901 Long term (current) use of anticoagulants: Secondary | ICD-10-CM

## 2019-08-05 LAB — POCT INR: INR: 2.4 (ref 2.0–3.0)

## 2019-08-05 NOTE — Patient Instructions (Signed)
Description   Today take 69m, then Continue 2.530mdaily except 72m19mn Sundays and Thursdays.  Please call our office with any medication changes or concerns [phone]. Return in 4 weeks for INR check.

## 2019-08-11 ENCOUNTER — Other Ambulatory Visit: Payer: Self-pay

## 2019-08-11 ENCOUNTER — Ambulatory Visit (INDEPENDENT_AMBULATORY_CARE_PROVIDER_SITE_OTHER): Payer: Medicare Other | Admitting: Medical

## 2019-08-11 DIAGNOSIS — L089 Local infection of the skin and subcutaneous tissue, unspecified: Secondary | ICD-10-CM

## 2019-08-11 DIAGNOSIS — I2510 Atherosclerotic heart disease of native coronary artery without angina pectoris: Secondary | ICD-10-CM

## 2019-08-11 DIAGNOSIS — D649 Anemia, unspecified: Secondary | ICD-10-CM | POA: Diagnosis not present

## 2019-08-11 MED ORDER — SULFAMETHOXAZOLE-TRIMETHOPRIM 800-160 MG PO TABS: 1.0000 | ORAL_TABLET | Freq: Two times a day (BID) | ORAL | 0 refills | Status: DC

## 2019-08-11 NOTE — Progress Notes (Signed)
   Subjective:    Patient ID: Lance Hicks, male    DOB: 03-03-1946, 73 y.o.   MRN: [id number]  HPI  Virtual Visit via Video Note  I connected with Lance Hicks on 08/11/19 at  1:40 PM EDT by a video enabled telemedicine application and verified that I am speaking with the correct person using two identifiers.  Location: Patient: home Provider: home  Pt did not take vitals today. Visit done virtual due to pandemic and associated risk.   I discussed the limitations of evaluation and management by telemedicine and the availability of in person appointments. The patient expressed understanding and agreed to proceed.  History of Present Illness: Pt states he got his rt hand caught between bag of dirt and his tailgate. He states tailgate covered with linking and he describes getting abrasion over previous scar from previous old injury. Skin is mild warm to touch. He states  area 2 inches by 2 inches. Shallow thin flap of skin in crescent moon shape. He just layed the flap down. Pt not having any fever, no chills or sweats.  Pt has hx of low iron. Labs done 2 months ago.Anemia was improving.     Observations/Objective:  General- no acute distress, pleasant, oriented and normal speech.  Rt hand- see hpi. No exam done due to telephone visit. Assessment and Plan: By description patient appears to have probable right hand early skin infection/cellulitis.  I decided to go ahead and send Bactrim DS antibiotic to patient's pharmacy.  Rx advised and given regarding potential effects on patient's INR.  However thought best to give antibiotic that would provide adequate coverage in the event of resistant type bacteria.  Advised patient need follow-up appointment in 2 to 3 days.  If not improving would get wound culture and might need to debride loose flap of skin if not viable.  Also on review looks like he is due for tetanus update.  When patient then will also repeat his iron levels and  CBC.  Follow-up in 2 to 3 days or as needed.  Asked patient to go ahead and call to make that appointment since I am working from home and want to make sure that the appointment was given.  Follow Up Instructions:    I discussed the assessment and treatment plan with the patient. The patient was provided an opportunity to ask questions and all were answered. The patient agreed with the plan and demonstrated an understanding of the instructions.   The patient was advised to call back or seek an in-person evaluation if the symptoms worsen or if the condition fails to improve as anticipated.  I provided 15  minutes of non-face-to-face time during this encounter.   [name], PA-C    Review of Systems  Constitutional: Negative for chills, fatigue and fever.  Respiratory: Negative for chest tightness, shortness of breath and wheezing.   Cardiovascular: Negative for chest pain and leg swelling.  Gastrointestinal: Negative for abdominal pain.  Musculoskeletal:       See hpi.  Skin:       Abrasion. See hpi.  Neurological: Negative for dizziness, numbness and headaches.  Hematological: Negative for adenopathy. Does not bruise/bleed easily.  Psychiatric/Behavioral: Negative for behavioral problems.       Objective:   Physical Exam        Assessment & Plan:

## 2019-08-11 NOTE — Patient Instructions (Addendum)
By description patient appears to have probable right hand early skin infection/cellulitis.  I decided to go ahead and send Bactrim DS antibiotic to patient's pharmacy.  Rx advised and given regarding potential effects on patient's INR.  However thought best to give antibiotic that would provide adequate coverage in the event of resistant type bacteria.  Advised patient need follow-up appointment in 2 to 3 days.  If not improving would get wound culture and might need to debride loose flap of skin if not viable.  Also on review looks like he is due for tetanus update.  When patient then will also repeat his iron levels and CBC.  Follow-up in 2 to 3 days or as needed.  Asked patient to go ahead and call to make that appointment since I am working from home and want to make sure that the appointment was given.

## 2019-08-13 ENCOUNTER — Encounter: Payer: Self-pay | Admitting: Medical

## 2019-08-13 ENCOUNTER — Ambulatory Visit (INDEPENDENT_AMBULATORY_CARE_PROVIDER_SITE_OTHER): Payer: Medicare Other | Admitting: Medical

## 2019-08-13 ENCOUNTER — Other Ambulatory Visit: Payer: Self-pay

## 2019-08-13 VITALS — BP 133/85 | HR 65 | Temp 97.7°F | Resp 16 | Ht 69.5 in | Wt 157.4 lb

## 2019-08-13 DIAGNOSIS — I2510 Atherosclerotic heart disease of native coronary artery without angina pectoris: Secondary | ICD-10-CM | POA: Diagnosis not present

## 2019-08-13 DIAGNOSIS — L089 Local infection of the skin and subcutaneous tissue, unspecified: Secondary | ICD-10-CM | POA: Diagnosis not present

## 2019-08-13 DIAGNOSIS — D649 Anemia, unspecified: Secondary | ICD-10-CM | POA: Diagnosis not present

## 2019-08-13 DIAGNOSIS — Z23 Encounter for immunization: Secondary | ICD-10-CM

## 2019-08-13 DIAGNOSIS — T148XXA Other injury of unspecified body region, initial encounter: Secondary | ICD-10-CM

## 2019-08-13 LAB — CBC WITH DIFFERENTIAL/PLATELET
Basophils Absolute: 0 10*3/uL (ref 0.0–0.1)
Basophils Relative: 0.6 % (ref 0.0–3.0)
Eosinophils Absolute: 0.2 10*3/uL (ref 0.0–0.7)
Eosinophils Relative: 2.9 % (ref 0.0–5.0)
HCT: 43 % (ref 39.0–52.0)
Hemoglobin: 14.1 g/dL (ref 13.0–17.0)
Lymphocytes Relative: 15.6 % (ref 12.0–46.0)
Lymphs Abs: 0.9 10*3/uL (ref 0.7–4.0)
MCHC: 32.9 g/dL (ref 30.0–36.0)
MCV: 85.2 fl (ref 78.0–100.0)
Monocytes Absolute: 0.7 10*3/uL (ref 0.1–1.0)
Monocytes Relative: 12.2 % — ABNORMAL HIGH (ref 3.0–12.0)
Neutro Abs: 3.9 10*3/uL (ref 1.4–7.7)
Neutrophils Relative %: 68.7 % (ref 43.0–77.0)
Platelets: 185 10*3/uL (ref 150.0–400.0)
RBC: 5.04 Mil/uL (ref 4.22–5.81)
RDW: 31.5 % — ABNORMAL HIGH (ref 11.5–15.5)
WBC: 5.7 10*3/uL (ref 4.0–10.5)

## 2019-08-13 LAB — IRON: Iron: 91 ug/dL (ref 42–165)

## 2019-08-13 MED ORDER — CEFTRIAXONE SODIUM 1 G IJ SOLR
1.0000 g | Freq: Once | INTRAMUSCULAR | Status: AC
  Administered 2019-08-13: 1 g via INTRAMUSCULAR

## 2019-08-13 NOTE — Patient Instructions (Signed)
You have mixed report recently regarding Bactrim use for skin infection.  Swelling has gone down but not much pain improvement.  I do think you benefit from 1 g Rocephin today and continue Bactrim DS.  But please update me Friday morning if area is a much better or not.  If not much improvement then would discontinue Bactrim and prescribe clindamycin.  Note not able to get a culture since no discharge from wound.   On review needs tetanus update.  History of anemia and on iron.  Will repeat CBC and iron level today.  Reminder want to repeat INR in 2 weeks since on antibiotics recently.  Follow-up in 5 to 7days virtual visit if area improving/doing better.  If not much improvement might need another in office visit.  Any rapid/ worsening changes let us know.

## 2019-08-13 NOTE — Progress Notes (Signed)
Subjective:    Patient ID: Lance Hicks, male    DOB: 09-Apr-1946, 73 y.o.   MRN: [id number]  HPI  Patient here to follow-up for hand abrasion.   Scraped it unloading a truck last Thursday. States he saw some small amounts of drainage initially but no more drainage. Site is now scabbed over but patient is reporting 9/10 tenderness that radiates up hand/wrist. He has tried applying honey and ice with no relief.Reports redness and swelling that worsens later in the day. No other associated symptoms. He has taken occasional tylenol with minimal relief.  States he still has a few days left of Bactrim that was prescribed on recent televisit.  Pt states has no discharge. Hand more tender all day. Some swelling during the late day. But overall states swelling a lot less than 2 days ago.  No fever, no chills or sweats, and no tracking.    Review of Systems  Constitutional: Negative for chills and fever.  Respiratory: Negative for chest tightness and shortness of breath.   Cardiovascular: Negative for chest pain.  Musculoskeletal: Negative for myalgias.  Skin: Positive for wound.       Right hand with abrasion that is now scabbed. Surrounding skin is warm and tender. No drainage. Full ROM in wrist.     Past Medical History:  Diagnosis Date  . 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 In light of concurrent left nephrolithiasis, will check CT renal colic next year instead of renal US.   [name] Anticoagulated on Coumadin 01/04/2018  . Anxiety 05/10/2016   Last Assessment & Plan:  Doing well off of zoloft.  . Asthma   . Asymptomatic microscopic hematuria 06/20/2017   Last Assessment & Plan:  Had hematuria workup in [facility] in 2016 which negative CT and cystoscopy. UA with 2+ blood last visit - we discussed recommendation for repeat workup at 5 years or if degree of hematuria progresses.   . Atrial fibrillation ([place]) 03/29/2017  . Atrial  flutter ([place]) 03/29/2017  . Chronic allergic rhinitis 04/28/2016   Last Assessment & Plan:  Continue astelin  . Chronic anticoagulation 03/29/2017  . Chronic atrial fibrillation 07/23/2015   Last Assessment & Plan:  Coumadin and metoprolol, cardiology referral to establish care.  . Chronic midline back pain 12/14/2015   Last Assessment & Plan:  Pain management referral for further evaluation.  . Chronic prostatitis 07/23/2015   Last Assessment & Plan:  Has largely resolved since stopping bike riding. Recommend annual DRE AND PSA - will see back 12/2015 for annual screening, given 1st degree fhx. To call office for recurrent prostatitis symptoms.   [name] COPD (chronic obstructive pulmonary disease) ([place]) 06/12/2016  . Coronary artery disease involving native coronary artery of native heart with angina pectoris ([place]) 03/29/2017  . Cough 10/17/2016   Last Assessment & Plan:  Discussed typical course for acute viral illness. If symptoms worsen or fail to improve by 7-10d, delayed ATBs, fluids, rest, NSAIDs/APAP prn. Seek care if not improving. Needs earlier INR check due to ATBs.  [name] Dyspnea 02/01/2016   Last Assessment & Plan:  Overall improving, eval by pulm, plan for CT, neg stress test with cardiology. Recent switch to carvedilol due to side effects.  . Epidermoid cyst of skin 08/24/2017  . Essential hypertension 12/14/2015   Last Assessment & Plan:  Hypertension control: controlled  Medications: compliant Medication Management: as noted in orders Home blood pressure monitoring recommended  additionally as needed for symptoms  The patient's care plan was reviewed and updated. Instructions and counseling were provided regarding patient goals and barriers. He was counseled to adopt a healthy lifestyle. Educational resources and self-management tools have been provided as charted in [facility] list.   . H/O maze procedure 03/29/2017  . [name] 03/29/2017   Overview:  2011  . Hx of CABG 03/29/2017  .  Hyperlipidemia 03/29/2017  . Hypertensive heart disease 03/29/2017  . Kidney stones 07/23/2015   Overview:  x 3  Last Assessment & Plan:  By [place] has left nephrolithiasis, but not visible by KUB. Will check CT renal colic next year to assess both stone burden as well as to surveil AML.   . Leukocytoclastic vasculitis ([place]) 10/01/2017  . Localized edema 01/04/2018  . Lumbar radicular pain 01/19/2016  . Maculopapular rash 09/03/2017  . Nephrolithiasis 07/23/2015   Overview:  x 3  Last Assessment & Plan:  By [place] has left nephrolithiasis, but not visible by KUB. Will check CT renal colic next year to assess both stone burden as well as to surveil AML.   Overview:  x 3  Last Assessment & Plan:  Has 17m nonobstructing LUP stone - not visible by KUB.  Will check renal [place] - he will contact office sooner if symptomatic.   . Non-sustained ventricular tachycardia ([place] 03/29/2017  . Other hyperlipidemia 03/29/2017  . Palpitations 10/01/2017  . Paroxysmal atrial fibrillation ([place] 03/29/2017  . Paroxysmal atrial fibrillation ([place] 03/29/2017  . Pleural effusion, bilateral 08/09/2017  . Post-nasal drainage 02/25/2016   Last Assessment & Plan:  Trial zyrtec and flonase  . Prostate cancer screening 06/20/2017   Last Assessment & Plan:  Recommend continued annual CaP screening until within 10 years of life expectancy. Given good health and fhx of longevity, would anticipate CaP screening to continue until age 72  PSA today and again in one year on day of visit.  . Pulmonary hypertension ([place] 08/09/2017  . Pulmonary nodules 06/12/2016  . S/P AVR (aortic valve replacement) 03/20/2016  . Supratherapeutic INR 07/26/2017  . Syncope 03/29/2017  . Syncope and collapse 02/01/2016  . Typical atrial flutter ([place] 02/01/2016     Social History   Socioeconomic History  . Marital status: Single    Spouse name: Not on file  . Number of children: Not on file  . Years of education: Not on file  . Highest education level: Not on file   Occupational History  . Not on file  Social Needs  . Financial resource strain: Not on file  . Food insecurity    Worry: Not on file    Inability: Not on file  . Transportation needs    Medical: Not on file    Non-medical: Not on file  Tobacco Use  . Smoking status: Former Smoker    Packs/day: 2.00    Years: 34.00    Pack years: 68.00    Types: Cigarettes    Quit date: 07/16/2000    Years since quitting: 19.0  . Smokeless tobacco: Never Used  Substance and Sexual Activity  . Alcohol use: Not Currently  . Drug use: No  . Sexual activity: Not on file  Lifestyle  . Physical activity    Days per week: Not on file    Minutes per session: Not on file  . Stress: Not on file  Relationships  . Social connections    Talks on phone: Not on file  Gets together: Not on file    Attends religious service: Not on file    Active member of club or organization: Not on file    Attends meetings of clubs or organizations: Not on file    Relationship status: Not on file  . Intimate partner violence    Fear of current or ex partner: Not on file    Emotionally abused: Not on file    Physically abused: Not on file    Forced sexual activity: Not on file  Other Topics Concern  . Not on file  Social History Narrative  . Not on file    Past Surgical History:  Procedure Laterality Date  . CHOLECYSTECTOMY    . CORONARY ARTERY BYPASS GRAFT    . EXPLORATORY LAPAROTOMY  07/30/2017  . FOOT SURGERY    . FRACTURE SURGERY Right    wrist and forearm  . HERNIA REPAIR    . MECHANICAL AORTIC VALVE REPLACEMENT    . NASAL SINUS SURGERY    . RIGHT HEART CATH N/A 01/18/2018   Procedure: RIGHT HEART CATH;  Surgeon: Jolaine Artist, MD;  Location: [place] [facility];  Service: Cardiovascular;  Laterality: N/A;  . RIGHT HEART CATH N/A 05/14/2018   Procedure: RIGHT HEART CATH;  Surgeon: Jolaine Artist, MD;  Location: [place] [facility];  Service: Cardiovascular;  Laterality: N/A;  . TEE WITHOUT  CARDIOVERSION N/A 05/21/2018   Procedure: TRANSESOPHAGEAL ECHOCARDIOGRAM (TEE);  Surgeon: Jolaine Artist, MD;  Location: [facility] ENDOSCOPY;  Service: Cardiovascular;  Laterality: N/A;  . UPPER GASTROINTESTINAL ENDOSCOPY  07/12/2017   Patchy areas of mucosal inflammation noted in the antrum with edema,erthema and ulcerations. Bx. Chronicfocally active gastritis.  [name] VASECTOMY      Family History  Problem Relation Age of Onset  . Asthma Mother   . Arthritis Mother   . Heart attack Father   . Hypertension Father   . Stroke Paternal Grandmother   . Colon cancer Neg Hx     Allergies  Allergen Reactions  . Protonix [Pantoprazole Sodium]     Gets gassy and starting itching like crazy  . Amoxicillin-Pot Clavulanate Diarrhea    Stomach pain Has patient had a PCN reaction causing immediate rash, facial/tongue/throat swelling, SOB or lightheadedness with hypotension: No Has patient had a PCN reaction causing severe rash involving mucus membranes or skin necrosis: No Has patient had a PCN reaction that required hospitalization: No Has patient had a PCN reaction occurring within the last 10 years: Yes If all of the above answers are "NO", then may proceed with Cephalosporin use.   . Tape Rash and Other (See Comments)    Surgical tape    Current Outpatient Medications on File Prior to Visit  Medication Sig Dispense Refill  . acetaminophen (TYLENOL) 650 MG CR tablet Take 1 tablet (650 mg total) by mouth every 8 (eight) hours as needed for pain. Not to use with norco since combined with tylenol. 30 tablet 0  . albuterol (VENTOLIN HFA) 108 (90 Base) MCG/ACT inhaler Inhale 2 puffs into the lungs every 4 (four) hours as needed for wheezing or shortness of breath. 18 g 5  . aspirin EC 81 MG tablet Take 81 mg by mouth daily.    . B Complex-C (B-COMPLEX WITH VITAMIN C) tablet Take 1 tablet by mouth daily.     . cetirizine (ZYRTEC ALLERGY) 10 MG tablet Take 10 mg by mouth daily as needed for allergies.      . Cholecalciferol (VITAMIN  D) 125 MCG (5000 UT) CAPS Take 1 capsule by mouth daily.    . Coenzyme Q10 (COQ10 PO) Take 1 tablet by mouth daily.    . ferrous sulfate 325 (65 FE) MG EC tablet Take 1 tablet (325 mg total) by mouth 3 (three) times daily with meals. 90 tablet 1  . hydrocortisone (ANUSOL-HC) 2.5 % rectal cream APP REC AA BID FOR 10 DAYS    . macitentan (OPSUMIT) 10 MG tablet Take 1 tablet (10 mg total) by mouth daily. 30 tablet 0  . Magnesium Gluconate (MAGNESIUM 27 PO) Take 1 tablet by mouth daily.    . midodrine (PROAMATINE) 10 MG tablet Take 1 tablet (10 mg total) by mouth 3 (three) times daily with meals. 90 tablet 6  . potassium chloride 20 MEQ TBCR Take 40 mEq by mouth daily. 184 tablet 2  . rosuvastatin (CRESTOR) 5 MG tablet TAKE 1 TABLET(5 MG) BY MOUTH DAILY 90 tablet 3  . spironolactone (ALDACTONE) 25 MG tablet TAKE 1/2 OF A TABLET BY MOUTH ONCE DAILY. 45 tablet 3  . sulfamethoxazole-trimethoprim (BACTRIM DS) 800-160 MG tablet Take 1 tablet by mouth 2 (two) times daily. 14 tablet 0  . tadalafil, PAH, (ADCIRCA) 20 MG tablet TAKE 2 TABLETS BY MOUTH ONCE A DAY 60 tablet 12  . torsemide (DEMADEX) 20 MG tablet Take 3 tablets (60 mg total) by mouth daily. 270 tablet 3  . warfarin (COUMADIN) 5 MG tablet As directed by coumadin clinic (Patient taking differently: daily. As directed by coumadin clinic) 30 tablet 3   No current facility-administered medications on file prior to visit.     BP 133/85   Pulse 65   Temp 97.7 F (36.5 C) (Temporal)   Resp 16   Ht 5' 9.5" (1.765 m)   Wt 157 lb 6.4 oz (71.4 kg)   SpO2 100%   BMI 22.91 kg/m       Objective:   Physical Exam  General- No acute distress. Pleasant patient. Neck- Full range of motion, no jvd Lungs- Clear, even and unlabored. Heart- regular rate and rhythm. Neurologic- CNII- XII grossly intact.   Rt hand- dorsal aspect.Scab present. Flap of skin did attach. Less swelling per pt. No warmth.          Assessment & Plan:  You have mixed report recently regarding Bactrim use for skin infection.  Swelling has gone down but not much pain improvement.  I do think you benefit from 1 g Rocephin today and continue Bactrim DS.  But please update me Friday morning if area is a much better or not.  If not much improvement then would discontinue Bactrim and prescribe clindamycin.  Note not able to get a culture since no discharge from wound.   On review needs tetanus update.  History of anemia and on iron.  Will repeat CBC and iron level today.  Reminder want to repeat INR in 2 weeks since on antibiotics recently.  Follow-up in 5 to 7days virtual visit if area improving/doing better.  If not much improvement might need another in office visit.  Any rapid/ worsening changes let us know.  25 minutes spent with pt. 50% of time spent counseling on treatment plan.   Pt initially seen by Caleen Jobs NP student before I interviewed pt. Initial documentation done by student but appropriate modifications made by myself after I interview and examined. Discussed tx plan with student but I made final treatment decision/plan.   [name], PA-C

## 2019-08-15 ENCOUNTER — Other Ambulatory Visit: Payer: Self-pay | Admitting: Medical

## 2019-08-21 ENCOUNTER — Telehealth: Payer: Self-pay

## 2019-08-21 NOTE — Telephone Encounter (Signed)
Copied from [place] [id number]. Topic: Quick Communication - See Telephone Encounter >> Aug 21, 2019  9:51 AM Loma [place] wrote: CRM for notification. See Telephone encounter for: 08/21/19. PT says hand is fine but could nurse let him know how long to take the iron tablets/ CB at [phone]

## 2019-08-21 NOTE — Telephone Encounter (Signed)
Your anemia was resolved and your iron was now normal. But iron was low mid June. I think good idea to stay on iron but can just take twice daily and will repeat labs again in 6 weeks. Make sure blood volume staying stable. Follow up appointment 6 weeks or sooner if getting fatigue.

## 2019-08-22 NOTE — Telephone Encounter (Signed)
Pt notified. Pt voiced understanding

## 2019-09-02 ENCOUNTER — Ambulatory Visit (INDEPENDENT_AMBULATORY_CARE_PROVIDER_SITE_OTHER): Payer: Medicare Other | Admitting: *Deleted

## 2019-09-02 ENCOUNTER — Other Ambulatory Visit: Payer: Self-pay

## 2019-09-02 DIAGNOSIS — Z7901 Long term (current) use of anticoagulants: Secondary | ICD-10-CM

## 2019-09-02 DIAGNOSIS — Z5181 Encounter for therapeutic drug level monitoring: Secondary | ICD-10-CM

## 2019-09-02 DIAGNOSIS — I4892 Unspecified atrial flutter: Secondary | ICD-10-CM

## 2019-09-02 LAB — POCT INR: INR: 2.3 (ref 2.0–3.0)

## 2019-09-02 NOTE — Patient Instructions (Signed)
Description   Today take 61m, then start taking  2.590mdaily except 73m16mn Sundays, Tuesdays and Thursdays.  Please call our office with any medication changes or concerns [phone]. Return in 2 weeks for INR check.

## 2019-09-16 ENCOUNTER — Ambulatory Visit (INDEPENDENT_AMBULATORY_CARE_PROVIDER_SITE_OTHER): Payer: Medicare Other | Admitting: Pharmacist Clinician (PhC)/ Clinical Pharmacy Specialist

## 2019-09-16 ENCOUNTER — Other Ambulatory Visit: Payer: Self-pay

## 2019-09-16 DIAGNOSIS — I4892 Unspecified atrial flutter: Secondary | ICD-10-CM | POA: Diagnosis not present

## 2019-09-16 DIAGNOSIS — Z5181 Encounter for therapeutic drug level monitoring: Secondary | ICD-10-CM

## 2019-09-16 DIAGNOSIS — Z7901 Long term (current) use of anticoagulants: Secondary | ICD-10-CM

## 2019-09-16 LAB — POCT INR: INR: 3 (ref 2.0–3.0)

## 2019-09-22 ENCOUNTER — Other Ambulatory Visit: Payer: Self-pay | Admitting: Cardiology

## 2019-10-02 ENCOUNTER — Other Ambulatory Visit (HOSPITAL_COMMUNITY): Payer: Self-pay

## 2019-10-02 MED ORDER — OPSUMIT 10 MG PO TABS: 10.0000 mg | ORAL_TABLET | Freq: Every day | ORAL | 0 refills | Status: DC

## 2019-10-06 ENCOUNTER — Ambulatory Visit: Payer: Medicare Other | Admitting: Cardiology

## 2019-10-09 ENCOUNTER — Other Ambulatory Visit: Payer: Self-pay

## 2019-10-09 ENCOUNTER — Encounter: Payer: Self-pay | Admitting: Medical

## 2019-10-09 ENCOUNTER — Ambulatory Visit (INDEPENDENT_AMBULATORY_CARE_PROVIDER_SITE_OTHER): Payer: Medicare Other | Admitting: Medical

## 2019-10-09 VITALS — BP 130/71 | HR 48 | Temp 97.7°F | Resp 16 | Ht 69.5 in | Wt 155.4 lb

## 2019-10-09 DIAGNOSIS — D649 Anemia, unspecified: Secondary | ICD-10-CM

## 2019-10-09 DIAGNOSIS — K7460 Unspecified cirrhosis of liver: Secondary | ICD-10-CM

## 2019-10-09 DIAGNOSIS — Z8719 Personal history of other diseases of the digestive system: Secondary | ICD-10-CM

## 2019-10-09 DIAGNOSIS — I4891 Unspecified atrial fibrillation: Secondary | ICD-10-CM | POA: Diagnosis not present

## 2019-10-09 DIAGNOSIS — Z23 Encounter for immunization: Secondary | ICD-10-CM

## 2019-10-09 DIAGNOSIS — I2510 Atherosclerotic heart disease of native coronary artery without angina pectoris: Secondary | ICD-10-CM

## 2019-10-09 LAB — COMPREHENSIVE METABOLIC PANEL
ALT: 14 U/L (ref 0–53)
AST: 29 U/L (ref 0–37)
Albumin: 4.3 g/dL (ref 3.5–5.2)
Alkaline Phosphatase: 75 U/L (ref 39–117)
BUN: 24 mg/dL — ABNORMAL HIGH (ref 6–23)
CO2: 25 mEq/L (ref 19–32)
Calcium: 9.5 mg/dL (ref 8.4–10.5)
Chloride: 98 mEq/L (ref 96–112)
Creatinine, Ser: 1.3 mg/dL (ref 0.40–1.50)
GFR: 54.18 mL/min — ABNORMAL LOW (ref >60.00)
Glucose, Bld: 65 mg/dL — ABNORMAL LOW (ref 70–99)
Potassium: 3.7 mEq/L (ref 3.5–5.1)
Sodium: 133 mEq/L — ABNORMAL LOW (ref 135–145)
Total Bilirubin: 2.2 mg/dL — ABNORMAL HIGH (ref 0.2–1.2)
Total Protein: 7.8 g/dL (ref 6.0–8.3)

## 2019-10-09 LAB — CBC WITH DIFFERENTIAL/PLATELET
Basophils Absolute: 0 10*3/uL (ref 0.0–0.1)
Basophils Relative: 0.5 % (ref 0.0–3.0)
Eosinophils Absolute: 0.1 10*3/uL (ref 0.0–0.7)
Eosinophils Relative: 1.8 % (ref 0.0–5.0)
HCT: 40.8 % (ref 39.0–52.0)
Hemoglobin: 13.8 g/dL (ref 13.0–17.0)
Lymphocytes Relative: 11.7 % — ABNORMAL LOW (ref 12.0–46.0)
Lymphs Abs: 0.8 10*3/uL (ref 0.7–4.0)
MCHC: 33.9 g/dL (ref 30.0–36.0)
MCV: 93 fl (ref 78.0–100.0)
Monocytes Absolute: 0.8 10*3/uL (ref 0.1–1.0)
Monocytes Relative: 11 % (ref 3.0–12.0)
Neutro Abs: 5.1 10*3/uL (ref 1.4–7.7)
Neutrophils Relative %: 75 % (ref 43.0–77.0)
Platelets: 213 10*3/uL (ref 150.0–400.0)
RBC: 4.39 Mil/uL (ref 4.22–5.81)
RDW: 15 % (ref 11.5–15.5)
WBC: 6.9 10*3/uL (ref 4.0–10.5)

## 2019-10-09 LAB — IRON, TOTAL/TOTAL IRON BINDING CAP
%SAT: 21 % (calc) (ref 20–48)
Iron: 73 ug/dL (ref 50–180)
TIBC: 346 mcg/dL (calc) (ref 250–425)

## 2019-10-09 MED ORDER — NEOMYCIN-POLYMYXIN-HC 3.5-10000-1 OT SOLN: 3.0000 [drp] | Freq: Four times a day (QID) | OTIC | 0 refills | Status: DC

## 2019-10-09 NOTE — Progress Notes (Addendum)
Subjective:    Patient ID: Lance Hicks, male    DOB: 18-Mar-1946, 73 y.o.   MRN: [id number]  HPI  Pt her today for follow up.  He does want flu vaccine.   Pt has history of anemia. He is on iron but has been off iron recently for one week.. Will repeat cbc and iron level today.   Pt last GI visit Assessment and Plan.  1. GERD (s/p EGD 06/2017 - at [facility], [place] -report reviewed, mild gastritis with negative biopsies, no varices). Had allergic reaction to protonix. Now with bloating.  #2.  Liver Cirrhosis (likely cardiac liver cirrhosis with congestive hepatomegaly). No ETOH. No evidence of portal hypertension.  Mild ascites likely d/t right heart failure with cor pulmonale and pulmonary HTN. Neg [place] with doppler 04/2018. Nl AFP 3.2 04/2019. Alb 4.1 05/2019. [place] 06/06/2019-congestive hepatomegaly, prominent hepatic veins and IVC d/t elevated right heart pressures.  #3. Rectal bleeding with constipation d/t internal hemorrhoids. Neg hemoccult. Neg colon in [place] (resolved).  #4  Anemia of chronic disease.  No overt GI bleeding.  #5. Co-morbid conditions include COPD,RV failure with pulmonary HTN/cor pulmonale, Afib with Nl EF 55% (07/2019), CAD s/p CABG, mechanical AVR on Coumadin, chronic respiratory failure, HLD, HTN.  Leuko-classic vasculitis.  #6.  RUQ abdominal pain likely d/t congestive hepatomegaly.    Review of Systems  Constitutional: Negative for chills, fatigue and fever.  HENT: Negative for congestion and facial swelling.        Mild irritation to left ear. Bug got in his ear about 10 days ago.  Respiratory: Negative for chest tightness, shortness of breath and wheezing.   Cardiovascular: Negative for chest pain and palpitations.  Gastrointestinal: Negative for abdominal distention, abdominal pain, anal bleeding, constipation and nausea.  Genitourinary: Negative for dysuria, frequency, penile pain, testicular pain and urgency.  Musculoskeletal: Negative for  arthralgias, back pain, joint swelling, neck pain and neck stiffness.  Skin: Negative for rash.  Neurological: Negative for dizziness, speech difficulty, weakness and light-headedness.  Hematological: Negative for adenopathy. Does not bruise/bleed easily.  Psychiatric/Behavioral: Negative for agitation and confusion. The patient is not nervous/anxious.     Past Medical History:  Diagnosis Date  . 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 In light of concurrent left nephrolithiasis, will check CT renal colic next year instead of renal US.   [name] Anticoagulated on Coumadin 01/04/2018  . Anxiety 05/10/2016   Last Assessment & Plan:  Doing well off of zoloft.  . Asthma   . Asymptomatic microscopic hematuria 06/20/2017   Last Assessment & Plan:  Had hematuria workup in [facility] in 2016 which negative CT and cystoscopy. UA with 2+ blood last visit - we discussed recommendation for repeat workup at 5 years or if degree of hematuria progresses.   . Atrial fibrillation ([place]) 03/29/2017  . Atrial flutter ([place]) 03/29/2017  . Chronic allergic rhinitis 04/28/2016   Last Assessment & Plan:  Continue astelin  . Chronic anticoagulation 03/29/2017  . Chronic atrial fibrillation ([place]) 07/23/2015   Last Assessment & Plan:  Coumadin and metoprolol, cardiology referral to establish care.  . Chronic midline back pain 12/14/2015   Last Assessment & Plan:  Pain management referral for further evaluation.  . Chronic prostatitis 07/23/2015   Last Assessment & Plan:  Has largely resolved since stopping bike riding. Recommend annual DRE AND PSA - will see back 12/2015 for annual screening, given 1st degree  fhx. To call office for recurrent prostatitis symptoms.   [name] COPD (chronic obstructive pulmonary disease) ([place]) 06/12/2016  . Coronary artery disease involving native coronary artery of native heart with angina pectoris ([place]) 03/29/2017  . Cough 10/17/2016   Last Assessment &  Plan:  Discussed typical course for acute viral illness. If symptoms worsen or fail to improve by 7-10d, delayed ATBs, fluids, rest, NSAIDs/APAP prn. Seek care if not improving. Needs earlier INR check due to ATBs.  [name] Dyspnea 02/01/2016   Last Assessment & Plan:  Overall improving, eval by pulm, plan for CT, neg stress test with cardiology. Recent switch to carvedilol due to side effects.  . Epidermoid cyst of skin 08/24/2017  . Essential hypertension 12/14/2015   Last Assessment & Plan:  Hypertension control: controlled  Medications: compliant Medication Management: as noted in orders Home blood pressure monitoring recommended additionally as needed for symptoms  The patient's care plan was reviewed and updated. Instructions and counseling were provided regarding patient goals and barriers. He was counseled to adopt a healthy lifestyle. Educational resources and self-management tools have been provided as charted in [facility] list.   . H/O maze procedure 03/29/2017  . [name] 03/29/2017   Overview:  2011  . Hx of CABG 03/29/2017  . Hyperlipidemia 03/29/2017  . Hypertensive heart disease 03/29/2017  . Kidney stones 07/23/2015   Overview:  x 3  Last Assessment & Plan:  By [place] has left nephrolithiasis, but not visible by KUB. Will check CT renal colic next year to assess both stone burden as well as to surveil AML.   . Leukocytoclastic vasculitis ([place]) 10/01/2017  . Localized edema 01/04/2018  . Lumbar radicular pain 01/19/2016  . Maculopapular rash 09/03/2017  . Nephrolithiasis 07/23/2015   Overview:  x 3  Last Assessment & Plan:  By [place] has left nephrolithiasis, but not visible by KUB. Will check CT renal colic next year to assess both stone burden as well as to surveil AML.   Overview:  x 3  Last Assessment & Plan:  Has 54m nonobstructing LUP stone - not visible by KUB.  Will check renal [place]/2019 - he will contact office sooner if symptomatic.   . Non-sustained ventricular tachycardia ([place]  03/29/2017  . Other hyperlipidemia 03/29/2017  . Palpitations 10/01/2017  . Paroxysmal atrial fibrillation ([place] 03/29/2017  . Paroxysmal atrial fibrillation ([place] 03/29/2017  . Pleural effusion, bilateral 08/09/2017  . Post-nasal drainage 02/25/2016   Last Assessment & Plan:  Trial zyrtec and flonase  . Prostate cancer screening 06/20/2017   Last Assessment & Plan:  Recommend continued annual CaP screening until within 10 years of life expectancy. Given good health and fhx of longevity, would anticipate CaP screening to continue until age 73  PSA today and again in one year on day of visit.  . Pulmonary hypertension ([place] 08/09/2017  . Pulmonary nodules 06/12/2016  . S/P AVR (aortic valve replacement) 03/20/2016  . Supratherapeutic INR 07/26/2017  . Syncope 03/29/2017  . Syncope and collapse 02/01/2016  . Typical atrial flutter ([place] 02/01/2016     Social History   Socioeconomic History  . Marital status: Single    Spouse name: Not on file  . Number of children: Not on file  . Years of education: Not on file  . Highest education level: Not on file  Occupational History  . Not on file  Social Needs  . Financial resource strain: Not on file  . Food insecurity    Worry:  Not on file    Inability: Not on file  . Transportation needs    Medical: Not on file    Non-medical: Not on file  Tobacco Use  . Smoking status: Former Smoker    Packs/day: 2.00    Years: 34.00    Pack years: 68.00    Types: Cigarettes    Quit date: 07/16/2000    Years since quitting: 19.2  . Smokeless tobacco: Never Used  Substance and Sexual Activity  . Alcohol use: Not Currently  . Drug use: No  . Sexual activity: Not on file  Lifestyle  . Physical activity    Days per week: Not on file    Minutes per session: Not on file  . Stress: Not on file  Relationships  . Social Herbalist on phone: Not on file    Gets together: Not on file    Attends religious service: Not on file    Active member of club or  organization: Not on file    Attends meetings of clubs or organizations: Not on file    Relationship status: Not on file  . Intimate partner violence    Fear of current or ex partner: Not on file    Emotionally abused: Not on file    Physically abused: Not on file    Forced sexual activity: Not on file  Other Topics Concern  . Not on file  Social History Narrative  . Not on file    Past Surgical History:  Procedure Laterality Date  . CHOLECYSTECTOMY    . CORONARY ARTERY BYPASS GRAFT    . EXPLORATORY LAPAROTOMY  07/30/2017  . FOOT SURGERY    . FRACTURE SURGERY Right    wrist and forearm  . HERNIA REPAIR    . MECHANICAL AORTIC VALVE REPLACEMENT    . NASAL SINUS SURGERY    . RIGHT HEART CATH N/A 01/18/2018   Procedure: RIGHT HEART CATH;  Surgeon: Jolaine Artist, MD;  Location: [facility];  Service: Cardiovascular;  Laterality: N/A;  . RIGHT HEART CATH N/A 05/14/2018   Procedure: RIGHT HEART CATH;  Surgeon: Jolaine Artist, MD;  Location: [facility];  Service: Cardiovascular;  Laterality: N/A;  . TEE WITHOUT CARDIOVERSION N/A 05/21/2018   Procedure: TRANSESOPHAGEAL ECHOCARDIOGRAM (TEE);  Surgeon: Jolaine Artist, MD;  Location: [facility];  Service: Cardiovascular;  Laterality: N/A;  . UPPER GASTROINTESTINAL ENDOSCOPY  07/12/2017   Patchy areas of mucosal inflammation noted in the antrum with edema,erthema and ulcerations. Bx. Chronicfocally active gastritis.  [name] VASECTOMY      Family History  Problem Relation Age of Onset  . Asthma Mother   . Arthritis Mother   . Heart attack Father   . Hypertension Father   . Stroke Paternal Grandmother   . Colon cancer Neg Hx     Allergies  Allergen Reactions  . Protonix [Pantoprazole Sodium]     Gets gassy and starting itching like crazy  . Amoxicillin-Pot Clavulanate Diarrhea    Stomach pain Has patient had a PCN reaction causing immediate rash, facial/tongue/throat swelling, SOB or lightheadedness with  hypotension: No Has patient had a PCN reaction causing severe rash involving mucus membranes or skin necrosis: No Has patient had a PCN reaction that required hospitalization: No Has patient had a PCN reaction occurring within the last 10 years: Yes If all of the above answers are "NO", then may proceed with Cephalosporin use.   . Tape Rash and Other (  See Comments)    Surgical tape    Current Outpatient Medications on File Prior to Visit  Medication Sig Dispense Refill  . acetaminophen (TYLENOL) 650 MG CR tablet Take 1 tablet (650 mg total) by mouth every 8 (eight) hours as needed for pain. Not to use with norco since combined with tylenol. 30 tablet 0  . albuterol (VENTOLIN HFA) 108 (90 Base) MCG/ACT inhaler Inhale 2 puffs into the lungs every 4 (four) hours as needed for wheezing or shortness of breath. 18 g 5  . aspirin EC 81 MG tablet Take 81 mg by mouth daily.    . B Complex-C (B-COMPLEX WITH VITAMIN C) tablet Take 1 tablet by mouth daily.     . cetirizine (ZYRTEC ALLERGY) 10 MG tablet Take 10 mg by mouth daily as needed for allergies.     . Cholecalciferol (VITAMIN D) 125 MCG (5000 UT) CAPS Take 1 capsule by mouth daily.    . Coenzyme Q10 (COQ10 PO) Take 1 tablet by mouth daily.    . ferrous sulfate 325 (65 FE) MG EC tablet TAKE 1 TABLET(325 MG) BY MOUTH THREE TIMES DAILY WITH MEALS 90 tablet 1  . hydrocortisone (ANUSOL-HC) 2.5 % rectal cream APP REC AA BID FOR 10 DAYS    . macitentan (OPSUMIT) 10 MG tablet Take 1 tablet (10 mg total) by mouth daily. 30 tablet 0  . Magnesium Gluconate (MAGNESIUM 27 PO) Take 1 tablet by mouth daily.    . midodrine (PROAMATINE) 10 MG tablet Take 1 tablet (10 mg total) by mouth 3 (three) times daily with meals. 90 tablet 6  . rosuvastatin (CRESTOR) 5 MG tablet TAKE 1 TABLET(5 MG) BY MOUTH DAILY 90 tablet 3  . spironolactone (ALDACTONE) 25 MG tablet TAKE 1/2 OF A TABLET BY MOUTH ONCE DAILY. 45 tablet 3  . sulfamethoxazole-trimethoprim (BACTRIM DS) 800-160  MG tablet Take 1 tablet by mouth 2 (two) times daily. 14 tablet 0  . tadalafil, PAH, (ADCIRCA) 20 MG tablet TAKE 2 TABLETS BY MOUTH ONCE A DAY 60 tablet 12  . torsemide (DEMADEX) 20 MG tablet Take 3 tablets (60 mg total) by mouth daily. 270 tablet 3  . warfarin (COUMADIN) 5 MG tablet TAKE AS DIRECTED BY COUMADIN CLINIC 30 tablet 3  . potassium chloride 20 MEQ TBCR Take 40 mEq by mouth daily. 184 tablet 2   No current facility-administered medications on file prior to visit.     BP 130/71   Pulse (!) 48   Temp 97.7 F (36.5 C) (Temporal)   Resp 16   Ht 5' 9.5" (1.765 m)   Wt 155 lb 6.4 oz (70.5 kg)   SpO2 97%   BMI 22.62 kg/m       Objective:   Physical Exam  General Mental Status- Alert. General Appearance- Not in acute distress.   Skin General: Color- Normal Color. Moisture- Normal Moisture.  heent- ear canal clear. No insect. Tm clear.   Neck Carotid Arteries- Normal color. Moisture- Normal Moisture. No carotid bruits. No JVD.  Chest and Lung Exam Auscultation: Breath Sounds:-Normal.  Cardiovascular Auscultation:Rythm- Regular. Murmurs & Other Heart Sounds:Auscultation of the heart reveals- No Murmurs.  Abdomen Inspection:-Inspeection Normal. Palpation/Percussion:Note:No mass. Palpation and Percussion of the abdomen reveal- Non Tender, Non Distended + BS, no rebound or guarding.  Neurologic Cranial Nerve exam:- CN III-XII intact(No nystagmus), symmetric smile. Strength:- 5/5 equal and symmetric strength both upper and lower extremities.    Assessment & Plan:  For you history of anemia, will get cbc  and iron level today. Will update GI MD on levels since in past colonoscopy was considered.  For hx of cirrhosis, will get cmp.  For hx of atrial fibrillation, continue coumadin.  Flu vaccine today.  For recent ear discomfort wrote cortisporin otic drops.   25 minutes with pt. 50% of time spent counseling pt on plan going forward.  Follow up date to be  determined after lab review.  [name], PA-C

## 2019-10-09 NOTE — Patient Instructions (Addendum)
For you history of anemia, will get cbc and iron level today. Will update GI MD on levels since in past colonoscopy was considered.  For hx of cirrhosis, will get cmp.  For hx of atrial fibrillation, continue coumadin.  Flu vaccine today.  For recent ear discomfort wrote cortisporin otic drops.  Follow up date to be determined after lab review.

## 2019-10-09 NOTE — Addendum Note (Signed)
Addended by: [name] Dyer on: 10/09/2019 11:07 AM   Modules accepted: Orders

## 2019-10-14 ENCOUNTER — Ambulatory Visit (INDEPENDENT_AMBULATORY_CARE_PROVIDER_SITE_OTHER): Payer: Medicare Other | Admitting: Pharmacist Clinician (PhC)/ Clinical Pharmacy Specialist

## 2019-10-14 ENCOUNTER — Other Ambulatory Visit: Payer: Self-pay

## 2019-10-14 DIAGNOSIS — Z5181 Encounter for therapeutic drug level monitoring: Secondary | ICD-10-CM

## 2019-10-14 DIAGNOSIS — I4892 Unspecified atrial flutter: Secondary | ICD-10-CM

## 2019-10-14 DIAGNOSIS — Z7901 Long term (current) use of anticoagulants: Secondary | ICD-10-CM | POA: Diagnosis not present

## 2019-10-14 LAB — POCT INR: INR: 2.5 (ref 2.0–3.0)

## 2019-11-03 ENCOUNTER — Other Ambulatory Visit: Payer: Self-pay

## 2019-11-03 ENCOUNTER — Ambulatory Visit (INDEPENDENT_AMBULATORY_CARE_PROVIDER_SITE_OTHER): Payer: Medicare Other | Admitting: Cardiology

## 2019-11-03 ENCOUNTER — Encounter: Payer: Self-pay | Admitting: Cardiology

## 2019-11-03 VITALS — BP 130/72 | HR 68 | Ht 69.5 in | Wt 157.0 lb

## 2019-11-03 DIAGNOSIS — I9589 Other hypotension: Secondary | ICD-10-CM | POA: Diagnosis not present

## 2019-11-03 DIAGNOSIS — Z952 Presence of prosthetic heart valve: Secondary | ICD-10-CM | POA: Diagnosis not present

## 2019-11-03 DIAGNOSIS — I2510 Atherosclerotic heart disease of native coronary artery without angina pectoris: Secondary | ICD-10-CM | POA: Diagnosis not present

## 2019-11-03 DIAGNOSIS — I272 Pulmonary hypertension, unspecified: Secondary | ICD-10-CM

## 2019-11-03 DIAGNOSIS — I130 Hypertensive heart and chronic kidney disease with heart failure and stage 1 through stage 4 chronic kidney disease, or unspecified chronic kidney disease: Secondary | ICD-10-CM | POA: Diagnosis not present

## 2019-11-03 DIAGNOSIS — I484 Atypical atrial flutter: Secondary | ICD-10-CM | POA: Diagnosis not present

## 2019-11-03 DIAGNOSIS — Z7901 Long term (current) use of anticoagulants: Secondary | ICD-10-CM | POA: Diagnosis not present

## 2019-11-03 NOTE — Progress Notes (Signed)
Cardiology Office Note:    Date:  11/03/2019   ID:  Lance Hicks, Lance Hicks 27-Aug-1946, MRN [id number]  PCP:  Lance Pai, PA-C  Cardiologist:  Lance More, MD    Referring MD: Lance Pai, PA-C    ASSESSMENT:    1. Atypical atrial flutter ([place])   2. Chronic anticoagulation   3. CAD in native artery   4. S/P AVR   5. Hypertensive heart and chronic kidney disease with heart failure and stage 1 through stage 4 chronic kidney disease, or chronic kidney disease ([place])   6. Pulmonary hypertension ([place])   7. Hypotension, chronic    PLAN:    In order of problems listed above:  1. Stable continue anticoagulation with atrial arrhythmia and mechanical AVR goal INR 2-1/2-3 2. Stable CAD New York Heart Association class I continue low-dose aspirin along with a statin 3. Stable prosthetic valve function consider echocardiogram after next visit 4. Stable he has edema but is not short of breath and is symptomatic hypotension precludes Hicks vigorous diuresis 5. Same presently managed by Dr. Haroldine Hicks heart failure program 6. Marked improvement continue midodrine   Next appointment: 3 months   Medication Adjustments/Labs and Tests Ordered: Current medicines are reviewed at length with the patient today.  Concerns regarding medicines are outlined above.  Orders Placed This Encounter  Procedures   EKG 12-Lead   No orders of the defined types were placed in this encounter.   Chief Complaint  Patient presents with   Follow-up    History of Present Illness:    Lance Hicks is a 73 y.o. male with a hx of permanent atrial fibrillation coronary artery disease aortic stenosis CABG aortic valve replacement #25 carbo metrics valve 2010 Maze procedure and left atrial appendage excision and pulmonary artery hypertension treated with Macitentan and Adcirca .  He has also has had a significant decline in GFR with CKD and symptomatic hypotension  He was last seen  07/01/2019. Compliance with diet, lifestyle and medications: Yes  Overall he is on a plateau he does vigorous work outdoors and is not having shortness of breath or syncope but he notices peripheral edema and carefully juggles his diuretics.  He has had no recurrent syncope and is improved with midodrine.  Compliant with his anticoagulant without bleeding complication.  Lance Hicks has been addressed by his primary care physician. 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 In light of concurrent left nephrolithiasis, will check CT renal colic next year instead of renal US.    Anticoagulated on Coumadin 01/04/2018   Anxiety 05/10/2016   Last Assessment & Plan:  Doing well off of zoloft.   Asthma    Asymptomatic microscopic hematuria 06/20/2017   Last Assessment & Plan:  Had hematuria workup in [facility] in 2016 which negative CT and cystoscopy. UA with 2+ blood last visit - we discussed recommendation for repeat workup at 5 years or if degree of hematuria progresses.    Atrial fibrillation ([place]) 03/29/2017   Atrial flutter ([place]) 03/29/2017   Chronic allergic rhinitis 04/28/2016   Last Assessment & Plan:  Continue astelin   Chronic anticoagulation 03/29/2017   Chronic atrial fibrillation ([place]) 07/23/2015   Last Assessment & Plan:  Coumadin and metoprolol, cardiology referral to establish care.   Chronic midline back pain 12/14/2015   Last Assessment & Plan:  Pain management referral for further evaluation.   Chronic  prostatitis 07/23/2015   Last Assessment & Plan:  Has largely resolved since stopping bike riding. Recommend annual DRE AND PSA - will see back 12/2015 for annual screening, given 1st degree fhx. To call office for recurrent prostatitis symptoms.    COPD (chronic obstructive pulmonary disease) ([place]) 06/12/2016   Coronary artery disease involving native coronary artery of native heart with angina  pectoris ([place]) 03/29/2017   Cough 10/17/2016   Last Assessment & Plan:  Discussed typical course for acute viral illness. If symptoms worsen or fail to improve by 7-10d, delayed ATBs, fluids, rest, NSAIDs/APAP prn. Seek care if not improving. Needs earlier INR check due to ATBs.   Dyspnea 02/01/2016   Last Assessment & Plan:  Overall improving, eval by pulm, plan for CT, neg stress test with cardiology. Recent switch to carvedilol due to side effects.   Epidermoid cyst of skin 08/24/2017   Essential hypertension 12/14/2015   Last Assessment & Plan:  Hypertension control: controlled  Medications: compliant Medication Management: as noted in orders Home blood pressure monitoring recommended additionally as needed for symptoms  The patient's care plan was reviewed and updated. Instructions and counseling were provided regarding patient goals and barriers. He was counseled to adopt a healthy lifestyle. Educational resources and self-management tools have been provided as charted in [facility] list.    H/O maze procedure 03/29/2017   [name] 03/29/2017   Overview:  2011   Hx of CABG 03/29/2017   Hyperlipidemia 03/29/2017   Hypertensive heart disease 03/29/2017   Kidney stones 07/23/2015   Overview:  x 3  Last Assessment & Plan:  By [place] has left nephrolithiasis, but not visible by KUB. Will check CT renal colic next year to assess both stone burden as well as to surveil AML.    Leukocytoclastic vasculitis ([place]) 10/01/2017   Localized edema 01/04/2018   Lumbar radicular pain 01/19/2016   Maculopapular rash 09/03/2017   Nephrolithiasis 07/23/2015   Overview:  x 3  Last Assessment & Plan:  By [place] has left nephrolithiasis, but not visible by KUB. Will check CT renal colic next year to assess both stone burden as well as to surveil AML.   Overview:  x 3  Last Assessment & Plan:  Has 38m nonobstructing LUP stone - not visible by KUB.  Will check renal [place]2019 - he will contact office  sooner if symptomatic.    Non-sustained ventricular tachycardia ([place] 03/29/2017   Other hyperlipidemia 03/29/2017   Palpitations 10/01/2017   Paroxysmal atrial fibrillation ([facility]) 03/29/2017   Paroxysmal atrial fibrillation ([place] 03/29/2017   Pleural effusion, bilateral 08/09/2017   Post-nasal drainage 02/25/2016   Last Assessment & Plan:  Trial zyrtec and flonase   Prostate cancer screening 06/20/2017   Last Assessment & Plan:  Recommend continued annual CaP screening until within 10 years of life expectancy. Given good health and fhx of longevity, would anticipate CaP screening to continue until age 73  PSA today and again in one year on day of visit.   Pulmonary hypertension ([place] 08/09/2017   Pulmonary nodules 06/12/2016   S/P AVR (aortic valve replacement) 03/20/2016   Supratherapeutic INR 07/26/2017   Syncope 03/29/2017   Syncope and collapse 02/01/2016   Typical atrial flutter ([place] 02/01/2016    Past Surgical History:  Procedure Laterality Date   CHOLECYSTECTOMY     CORONARY ARTERY BYPASS GRAFT     EXPLORATORY LAPAROTOMY  07/30/2017   FOOT SURGERY     FRACTURE SURGERY Right  wrist and forearm   HERNIA REPAIR     MECHANICAL AORTIC VALVE REPLACEMENT     NASAL SINUS SURGERY     RIGHT HEART CATH N/A 01/18/2018   Procedure: RIGHT HEART CATH;  Surgeon: Jolaine Artist, MD;  Location: [place] [facility];  Service: Cardiovascular;  Laterality: N/A;   RIGHT HEART CATH N/A 05/14/2018   Procedure: RIGHT HEART CATH;  Surgeon: Jolaine Artist, MD;  Location: [place] [facility];  Service: Cardiovascular;  Laterality: N/A;   TEE WITHOUT CARDIOVERSION N/A 05/21/2018   Procedure: TRANSESOPHAGEAL ECHOCARDIOGRAM (TEE);  Surgeon: Jolaine Artist, MD;  Location: [facility] ENDOSCOPY;  Service: Cardiovascular;  Laterality: N/A;   UPPER GASTROINTESTINAL ENDOSCOPY  07/12/2017   Patchy areas of mucosal inflammation noted in the antrum with edema,erthema and ulcerations. Bx. Chronicfocally  active gastritis.   VASECTOMY      Current Medications: Current Meds  Medication Sig   acetaminophen (TYLENOL) 650 MG CR tablet Take 1 tablet (650 mg total) by mouth every 8 (eight) hours as needed for pain. Not to use with norco since combined with tylenol.   albuterol (VENTOLIN HFA) 108 (90 Base) MCG/ACT inhaler Inhale 2 puffs into the lungs every 4 (four) hours as needed for wheezing or shortness of breath.   aspirin EC 81 MG tablet Take 81 mg by mouth daily.   B Complex-C (B-COMPLEX WITH VITAMIN C) tablet Take 1 tablet by mouth daily.    cetirizine (ZYRTEC ALLERGY) 10 MG tablet Take 10 mg by mouth daily as needed for allergies.    Cholecalciferol (VITAMIN D) 125 MCG (5000 UT) CAPS Take 1 capsule by mouth daily.   Coenzyme Q10 (COQ10 PO) Take 1 tablet by mouth daily.   ferrous sulfate 325 (65 FE) MG EC tablet TAKE 1 TABLET(325 MG) BY MOUTH THREE TIMES DAILY WITH MEALS   hydrocortisone (ANUSOL-HC) 2.5 % rectal cream APP REC AA BID FOR 10 DAYS   macitentan (OPSUMIT) 10 MG tablet Take 1 tablet (10 mg total) by mouth daily.   Magnesium Gluconate (MAGNESIUM 27 PO) Take 1 tablet by mouth daily.   midodrine (PROAMATINE) 10 MG tablet Take 1 tablet (10 mg total) by mouth 3 (three) times daily with meals.   neomycin-polymyxin-hydrocortisone (CORTISPORIN) OTIC solution Place 3 drops into the left ear 4 (four) times daily.   potassium chloride 20 MEQ TBCR Take 40 mEq by mouth daily.   rosuvastatin (CRESTOR) 5 MG tablet TAKE 1 TABLET(5 MG) BY MOUTH DAILY   spironolactone (ALDACTONE) 25 MG tablet TAKE 1/2 OF A TABLET BY MOUTH ONCE DAILY.   tadalafil, PAH, (ADCIRCA) 20 MG tablet TAKE 2 TABLETS BY MOUTH ONCE A DAY   torsemide (DEMADEX) 20 MG tablet Take 20 mg by mouth as directed. Takes 1.5 - 2 tablets daily   warfarin (COUMADIN) 5 MG tablet TAKE AS DIRECTED BY COUMADIN CLINIC     Allergies:   Protonix [pantoprazole sodium], Amoxicillin-pot clavulanate, and Tape   Social History    Socioeconomic History   Marital status: Single    Spouse name: Not on file   Number of children: Not on file   Years of education: Not on file   Highest education level: Not on file  Occupational History   Not on file  Social Needs   Financial resource strain: Not on file   Food insecurity    Worry: Not on file    Inability: Not on file   Transportation needs    Medical: Not on file  Non-medical: Not on file  Tobacco Use   Smoking status: Former Smoker    Packs/day: 2.00    Years: 34.00    Pack years: 68.00    Types: Cigarettes    Quit date: 07/16/2000    Years since quitting: 19.3   Smokeless tobacco: Never Used  Substance and Sexual Activity   Alcohol use: Not Currently   Drug use: No   Sexual activity: Not on file  Lifestyle   Physical activity    Days per week: Not on file    Minutes per session: Not on file   Stress: Not on file  Relationships   Social connections    Talks on phone: Not on file    Gets together: Not on file    Attends religious service: Not on file    Active member of club or organization: Not on file    Attends meetings of clubs or organizations: Not on file    Relationship status: Not on file  Other Topics Concern   Not on file  Social History Narrative   Not on file     Family History: The patient's family history includes Arthritis in his mother; Asthma in his mother; Heart attack in his father; Hypertension in his father; Stroke in his paternal grandmother. There is no history of Colon cancer. ROS:   Please see the history of present illness.    All other systems reviewed and are negative.  EKGs/Labs/Other Studies Reviewed:    The following studies were reviewed today:  EKG:  EKG ordered today and personally reviewed.  The ekg ordered today demonstrates atypical atrial flutter variable conduction effective rate 68 bpm he has incomplete right bundle branch block  Recent Labs:  Ref Range & Units 2wk ago 64mo ago 255mogo  INR 2.0 - 3.0 2.5  3.0  2.3     06/06/2019: B Natriuretic Peptide 618.3 10/09/2019: ALT 14; BUN 24; Creatinine, Ser 1.30; Hemoglobin 13.8; Platelets 213.0; Potassium 3.7; Sodium 133  Recent Lipid Panel    Component Value Date/Time   CHOL 125 10/21/2018 1028   TRIG 57 10/21/2018 1028   HDL 43 10/21/2018 1028   CHOLHDL 2.9 10/21/2018 1028   VLDL 11 10/21/2018 1028   LDLCALC 71 10/21/2018 1028    Physical Exam:    VS:  BP 130/72 (BP Location: Right Arm, Patient Position: Sitting, Cuff Size: Normal)    Pulse 68    Ht 5' 9.5" (1.765 m)    Wt 157 lb (71.2 kg)    SpO2 96%    BMI 22.85 kg/m     Wt Readings from Last 3 Encounters:  11/03/19 157 lb (71.2 kg)  10/09/19 155 lb 6.4 oz (70.5 kg)  08/13/19 157 lb 6.4 oz (71.4 kg)     GEN:  Well nourished, well developed in no acute distress HEENT: Normal NECK: No JVD; No carotid bruits LYMPHATICS: No lymphadenopathy CARDIAC: Irregular variable first heart sound sharp closing sound of AVR RRR, no murmurs, rubs, gallops RESPIRATORY:  Clear to auscultation without rales, wheezing or rhonchi  ABDOMEN: Soft, non-tender, non-distended MUSCULOSKELETAL:  1-2+ ankle to knee pitting bilateral edema; No deformity  SKIN: Warm and dry NEUROLOGIC:  Alert and oriented x 3 PSYCHIATRIC:  Normal affect    Signed, BrShirlee MoreMD  11/03/2019 4:58 PM    [facility]

## 2019-11-03 NOTE — Patient Instructions (Signed)
Medication Instructions:  Your physician recommends that you continue on your current medications as directed. Please refer to the Current Medication list given to you today.  *If you need a refill on your cardiac medications before your next appointment, please call your pharmacy*  Lab Work: None  If you have labs (blood work) drawn today and your tests are completely normal, you will receive your results only by: [name] MyChart Message (if you have MyChart) OR . A paper copy in the mail If you have any lab test that is abnormal or we need to change your treatment, we will call you to review the results.  Testing/Procedures: You had an EKG today.   Follow-Up: At [facility], you and your health needs are our priority.  As part of our continuing mission to provide you with exceptional heart care, we have created designated Provider Care Teams.  These Care Teams include your primary Cardiologist (physician) and Advanced Practice Providers (APPs -  Physician Assistants and Nurse Practitioners) who all work together to provide you with the care you need, when you need it.  Your next appointment:   3 months  The format for your next appointment:   In Person  Provider:   Shirlee More, MD

## 2019-11-14 ENCOUNTER — Ambulatory Visit (INDEPENDENT_AMBULATORY_CARE_PROVIDER_SITE_OTHER): Payer: Medicare Other | Admitting: Medical

## 2019-11-14 ENCOUNTER — Encounter: Payer: Self-pay | Admitting: Medical

## 2019-11-14 ENCOUNTER — Other Ambulatory Visit: Payer: Self-pay

## 2019-11-14 VITALS — BP 117/72 | Temp 95.7°F | Ht 69.5 in | Wt 141.0 lb

## 2019-11-14 DIAGNOSIS — F419 Anxiety disorder, unspecified: Secondary | ICD-10-CM

## 2019-11-14 DIAGNOSIS — I2510 Atherosclerotic heart disease of native coronary artery without angina pectoris: Secondary | ICD-10-CM

## 2019-11-14 MED ORDER — BUSPIRONE HCL 7.5 MG PO TABS: 7.5000 mg | ORAL_TABLET | Freq: Two times a day (BID) | ORAL | 0 refills | Status: DC

## 2019-11-14 NOTE — Progress Notes (Signed)
Subjective:    Patient ID: Lance Hicks, male    DOB: Dec 05, 1946, 73 y.o.   MRN: [id number]  HPI  Virtual Visit via Telephone Note  I connected with Lance Hicks on 11/14/19 at  3:00 PM EST by telephone and verified that I am speaking with the correct person using two identifiers.  Location: Patient: home Provider: office   I discussed the limitations, risks, security and privacy concerns of performing an evaluation and management service by telephone and the availability of in person appointments. I also discussed with the patient that there may be a patient responsible charge related to this service. The patient expressed understanding and agreed to proceed.  Pt did not check his vital signs.   History of Present Illness: Pt states he is very anxious recently about covid situation. He states he can't go to church, socialize and can't work out.  He states he got tremendous relief from working out.  Years ago he used wellutrin when maybe depressed.  He at times feels like he might hyperventilate when    Observations/Objective:  General- no acute distress. Pleasant, oriented and normal speech.   Assessment and Plan: For anxiety, I will rx buspar and have you start that today. If you don't  feel improvement by 7-10 days let me know and would switch you to clonazepam.   Also might consider ssri type medication.  Follow up 7-10 days or as needed  Follow Up Instructions:    I discussed the assessment and treatment plan with the patient. The patient was provided an opportunity to ask questions and all were answered. The patient agreed with the plan and demonstrated an understanding of the instructions.   The patient was advised to call back or seek an in-person evaluation if the symptoms worsen or if the condition fails to improve as anticipated.  I provided  15  minutes of non-face-to-face time during this encounter.   [name], PA-C    Review of  Systems  Constitutional: Negative for chills, fatigue and fever.  Respiratory: Negative for cough, chest tightness, shortness of breath and wheezing.   Cardiovascular: Negative for chest pain and palpitations.  Gastrointestinal: Negative for abdominal distention, abdominal pain, anal bleeding, diarrhea and nausea.  Skin: Negative for rash.  Neurological: Negative for dizziness, seizures, weakness and headaches.  Hematological: Negative for adenopathy. Does not bruise/bleed easily.  Psychiatric/Behavioral: Negative for behavioral problems, dysphoric mood, self-injury, sleep disturbance and suicidal ideas. The patient is nervous/anxious.    Past Medical History:  Diagnosis Date  . 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 In light of concurrent left nephrolithiasis, will check CT renal colic next year instead of renal US.   [name] Anticoagulated on Coumadin 01/04/2018  . Anxiety 05/10/2016   Last Assessment & Plan:  Doing well off of zoloft.  . Asthma   . Asymptomatic microscopic hematuria 06/20/2017   Last Assessment & Plan:  Had hematuria workup in [facility] in 2016 which negative CT and cystoscopy. UA with 2+ blood last visit - we discussed recommendation for repeat workup at 5 years or if degree of hematuria progresses.   . Atrial fibrillation ([place]) 03/29/2017  . Atrial flutter ([place]) 03/29/2017  . Chronic allergic rhinitis 04/28/2016   Last Assessment & Plan:  Continue astelin  . Chronic anticoagulation 03/29/2017  . Chronic atrial fibrillation ([place]) 07/23/2015   Last Assessment & Plan:  Coumadin and metoprolol, cardiology referral to establish  care.  . Chronic midline back pain 12/14/2015   Last Assessment & Plan:  Pain management referral for further evaluation.  . Chronic prostatitis 07/23/2015   Last Assessment & Plan:  Has largely resolved since stopping bike riding. Recommend annual DRE AND PSA - will see back 12/2015 for annual screening, given  1st degree fhx. To call office for recurrent prostatitis symptoms.   [name] COPD (chronic obstructive pulmonary disease) ([place]) 06/12/2016  . Coronary artery disease involving native coronary artery of native heart with angina pectoris ([place]) 03/29/2017  . Cough 10/17/2016   Last Assessment & Plan:  Discussed typical course for acute viral illness. If symptoms worsen or fail to improve by 7-10d, delayed ATBs, fluids, rest, NSAIDs/APAP prn. Seek care if not improving. Needs earlier INR check due to ATBs.  [name] Dyspnea 02/01/2016   Last Assessment & Plan:  Overall improving, eval by pulm, plan for CT, neg stress test with cardiology. Recent switch to carvedilol due to side effects.  . Epidermoid cyst of skin 08/24/2017  . Essential hypertension 12/14/2015   Last Assessment & Plan:  Hypertension control: controlled  Medications: compliant Medication Management: as noted in orders Home blood pressure monitoring recommended additionally as needed for symptoms  The patient's care plan was reviewed and updated. Instructions and counseling were provided regarding patient goals and barriers. He was counseled to adopt a healthy lifestyle. Educational resources and self-management tools have been provided as charted in [facility] list.   . H/O maze procedure 03/29/2017  . [name] 03/29/2017   Overview:  2011  . Hx of CABG 03/29/2017  . Hyperlipidemia 03/29/2017  . Hypertensive heart disease 03/29/2017  . Kidney stones 07/23/2015   Overview:  x 3  Last Assessment & Plan:  By [place] has left nephrolithiasis, but not visible by KUB. Will check CT renal colic next year to assess both stone burden as well as to surveil AML.   . Leukocytoclastic vasculitis ([place]) 10/01/2017  . Localized edema 01/04/2018  . Lumbar radicular pain 01/19/2016  . Maculopapular rash 09/03/2017  . Nephrolithiasis 07/23/2015   Overview:  x 3  Last Assessment & Plan:  By [place] has left nephrolithiasis, but not visible by KUB. Will check CT renal colic  next year to assess both stone burden as well as to surveil AML.   Overview:  x 3  Last Assessment & Plan:  Has 40m nonobstructing LUP stone - not visible by KUB.  Will check renal [place]2019 - he will contact office sooner if symptomatic.   . Non-sustained ventricular tachycardia ([place] 03/29/2017  . Other hyperlipidemia 03/29/2017  . Palpitations 10/01/2017  . Paroxysmal atrial fibrillation ([place] 03/29/2017  . Paroxysmal atrial fibrillation ([place] 03/29/2017  . Pleural effusion, bilateral 08/09/2017  . Post-nasal drainage 02/25/2016   Last Assessment & Plan:  Trial zyrtec and flonase  . Prostate cancer screening 06/20/2017   Last Assessment & Plan:  Recommend continued annual CaP screening until within 10 years of life expectancy. Given good health and fhx of longevity, would anticipate CaP screening to continue until age 73  PSA today and again in one year on day of visit.  . Pulmonary hypertension ([place] 08/09/2017  . Pulmonary nodules 06/12/2016  . S/P AVR (aortic valve replacement) 03/20/2016  . Supratherapeutic INR 07/26/2017  . Syncope 03/29/2017  . Syncope and collapse 02/01/2016  . Typical atrial flutter ([place] 02/01/2016     Social History   Socioeconomic History  . Marital status: Single  Spouse name: Not on file  . Number of children: Not on file  . Years of education: Not on file  . Highest education level: Not on file  Occupational History  . Not on file  Social Needs  . Financial resource strain: Not on file  . Food insecurity    Worry: Not on file    Inability: Not on file  . Transportation needs    Medical: Not on file    Non-medical: Not on file  Tobacco Use  . Smoking status: Former Smoker    Packs/day: 2.00    Years: 34.00    Pack years: 68.00    Types: Cigarettes    Quit date: 07/16/2000    Years since quitting: 19.3  . Smokeless tobacco: Never Used  Substance and Sexual Activity  . Alcohol use: Not Currently  . Drug use: No  . Sexual activity: Not on file  Lifestyle  .  Physical activity    Days per week: Not on file    Minutes per session: Not on file  . Stress: Not on file  Relationships  . Social Herbalist on phone: Not on file    Gets together: Not on file    Attends religious service: Not on file    Active member of club or organization: Not on file    Attends meetings of clubs or organizations: Not on file    Relationship status: Not on file  . Intimate partner violence    Fear of current or ex partner: Not on file    Emotionally abused: Not on file    Physically abused: Not on file    Forced sexual activity: Not on file  Other Topics Concern  . Not on file  Social History Narrative  . Not on file    Past Surgical History:  Procedure Laterality Date  . CHOLECYSTECTOMY    . CORONARY ARTERY BYPASS GRAFT    . EXPLORATORY LAPAROTOMY  07/30/2017  . FOOT SURGERY    . FRACTURE SURGERY Right    wrist and forearm  . HERNIA REPAIR    . MECHANICAL AORTIC VALVE REPLACEMENT    . NASAL SINUS SURGERY    . RIGHT HEART CATH N/A 01/18/2018   Procedure: RIGHT HEART CATH;  Surgeon: Jolaine Artist, MD;  Location: [facility];  Service: Cardiovascular;  Laterality: N/A;  . RIGHT HEART CATH N/A 05/14/2018   Procedure: RIGHT HEART CATH;  Surgeon: Jolaine Artist, MD;  Location: [place] [facility];  Service: Cardiovascular;  Laterality: N/A;  . TEE WITHOUT CARDIOVERSION N/A 05/21/2018   Procedure: TRANSESOPHAGEAL ECHOCARDIOGRAM (TEE);  Surgeon: Jolaine Artist, MD;  Location: [facility] END[facility]COPY;  Service: Cardiovascular;  Laterality: N/A;  . UPPER GASTROINTESTINAL ENDOSCOPY  07/12/2017   Patchy areas of mucosal inflammation noted in the antrum with edema,erthema and ulcerations. Bx. Chronicfocally active gastritis.  [name] VASECTOMY      Family History  Problem Relation Age of Onset  . Asthma Mother   . Arthritis Mother   . Heart attack Father   . Hypertension Father   . Stroke Paternal Grandmother   . Colon cancer Neg Hx      Allergies  Allergen Reactions  . Protonix [Pantoprazole Sodium]     Gets gassy and starting itching like crazy  . Amoxicillin-Pot Clavulanate Diarrhea    Stomach pain Has patient had a PCN reaction causing immediate rash, facial/tongue/throat swelling, SOB or lightheadedness with hypotension: No Has patient had a  PCN reaction causing severe rash involving mucus membranes or skin necrosis: No Has patient had a PCN reaction that required hospitalization: No Has patient had a PCN reaction occurring within the last 10 years: Yes If all of the above answers are "NO", then may proceed with Cephalosporin use.   . Tape Rash and Other (See Comments)    Surgical tape    Current Outpatient Medications on File Prior to Visit  Medication Sig Dispense Refill  . acetaminophen (TYLENOL) 650 MG CR tablet Take 1 tablet (650 mg total) by mouth every 8 (eight) hours as needed for pain. Not to use with norco since combined with tylenol. 30 tablet 0  . albuterol (VENTOLIN HFA) 108 (90 Base) MCG/ACT inhaler Inhale 2 puffs into the lungs every 4 (four) hours as needed for wheezing or shortness of breath. 18 g 5  . aspirin EC 81 MG tablet Take 81 mg by mouth daily.    . B Complex-C (B-COMPLEX WITH VITAMIN C) tablet Take 1 tablet by mouth daily.     . cetirizine (ZYRTEC ALLERGY) 10 MG tablet Take 10 mg by mouth daily as needed for allergies.     . Cholecalciferol (VITAMIN D) 125 MCG (5000 UT) CAPS Take 1 capsule by mouth daily.    . Coenzyme Q10 (COQ10 PO) Take 1 tablet by mouth daily.    . ferrous sulfate 325 (65 FE) MG EC tablet TAKE 1 TABLET(325 MG) BY MOUTH THREE TIMES DAILY WITH MEALS 90 tablet 1  . hydrocortisone (ANUSOL-HC) 2.5 % rectal cream APP REC AA BID FOR 10 DAYS    . macitentan (OPSUMIT) 10 MG tablet Take 1 tablet (10 mg total) by mouth daily. 30 tablet 0  . Magnesium Gluconate (MAGNESIUM 27 PO) Take 1 tablet by mouth daily.    . midodrine (PROAMATINE) 10 MG tablet Take 1 tablet (10 mg total) by  mouth 3 (three) times daily with meals. 90 tablet 6  . neomycin-polymyxin-hydrocortisone (CORTISPORIN) OTIC solution Place 3 drops into the left ear 4 (four) times daily. 10 mL 0  . potassium chloride 20 MEQ TBCR Take 40 mEq by mouth daily. 184 tablet 2  . rosuvastatin (CRESTOR) 5 MG tablet TAKE 1 TABLET(5 MG) BY MOUTH DAILY 90 tablet 3  . spironolactone (ALDACTONE) 25 MG tablet TAKE 1/2 OF A TABLET BY MOUTH ONCE DAILY. 45 tablet 3  . tadalafil, PAH, (ADCIRCA) 20 MG tablet TAKE 2 TABLETS BY MOUTH ONCE A DAY 60 tablet 12  . torsemide (DEMADEX) 20 MG tablet Take 20 mg by mouth as directed. Takes 1.5 - 2 tablets daily    . warfarin (COUMADIN) 5 MG tablet TAKE AS DIRECTED BY COUMADIN CLINIC 30 tablet 3   No current facility-administered medications on file prior to visit.     BP 117/72   Temp (!) 95.7 F (35.4 C) (Oral)   Ht 5' 9.5" (1.765 m)   Wt 141 lb (64 kg)   BMI 20.52 kg/m       Objective:   Physical Exam        Assessment & Plan:

## 2019-11-14 NOTE — Patient Instructions (Addendum)
For anxiety, I will rx buspar and have you start that today. If you don't  feel improvement by 7-10 days let me know and would switch you to clonazepam.   Also might consider ssri type medication.  Follow up 7-10 days or as needed

## 2019-11-24 ENCOUNTER — Telehealth (HOSPITAL_COMMUNITY): Payer: Self-pay | Admitting: Pharmacist

## 2019-11-24 MED ORDER — TADALAFIL (PAH) 20 MG PO TABS: 40.0000 mg | ORAL_TABLET | Freq: Every day | ORAL | 11 refills | Status: DC

## 2019-11-24 NOTE — Telephone Encounter (Signed)
Received fax from [place] that patient's prescription for tadalafil had expired. Sent in refills for tadalafil 40 mg daily to [facility].

## 2019-11-25 ENCOUNTER — Ambulatory Visit (INDEPENDENT_AMBULATORY_CARE_PROVIDER_SITE_OTHER): Payer: Medicare Other | Admitting: Pharmacist

## 2019-11-25 ENCOUNTER — Other Ambulatory Visit: Payer: Self-pay

## 2019-11-25 DIAGNOSIS — I4892 Unspecified atrial flutter: Secondary | ICD-10-CM | POA: Diagnosis not present

## 2019-11-25 DIAGNOSIS — Z5181 Encounter for therapeutic drug level monitoring: Secondary | ICD-10-CM

## 2019-11-25 DIAGNOSIS — Z7901 Long term (current) use of anticoagulants: Secondary | ICD-10-CM | POA: Diagnosis not present

## 2019-11-25 LAB — POCT INR: INR: 2.4 (ref 2.0–3.0)

## 2019-11-25 NOTE — Patient Instructions (Signed)
Take 1.5 tablet tonight then continue with 2.28m daily except 526mon Sundays, Tuesdays and Thursdays.  Please call our office with any medication changes or concerns [phone]. Return in 6 weeks for INR check.

## 2019-11-28 ENCOUNTER — Ambulatory Visit (INDEPENDENT_AMBULATORY_CARE_PROVIDER_SITE_OTHER): Payer: Medicare Other | Admitting: Medical

## 2019-11-28 ENCOUNTER — Other Ambulatory Visit: Payer: Self-pay

## 2019-11-28 ENCOUNTER — Encounter: Payer: Self-pay | Admitting: Medical

## 2019-11-28 ENCOUNTER — Telehealth: Payer: Self-pay | Admitting: Medical

## 2019-11-28 VITALS — BP 126/67 | Temp 96.7°F

## 2019-11-28 DIAGNOSIS — I2510 Atherosclerotic heart disease of native coronary artery without angina pectoris: Secondary | ICD-10-CM

## 2019-11-28 DIAGNOSIS — F419 Anxiety disorder, unspecified: Secondary | ICD-10-CM | POA: Diagnosis not present

## 2019-11-28 NOTE — Patient Instructions (Signed)
For anxiety I want you to increase buspar to 15 mg twice daily.(double up on 7.5 mg dose).  Update me on this coming Monday if this worked or not. If not working then would try low dose effexor 37.5 mg daily and limited number of clonazepam.  Follow up to be determined or as needed

## 2019-11-28 NOTE — Telephone Encounter (Signed)
appt scheduled

## 2019-11-28 NOTE — Telephone Encounter (Signed)
Needs appt with PCP please.

## 2019-11-28 NOTE — Telephone Encounter (Signed)
Patient called and wanted to let Lance Hicks know that the medication busPIRone (BUSPAR) 7.5 MG tablet due to its not working for him and thinks he may need to go up on doze or get something stronger. Please call patient back, thanks.

## 2019-11-28 NOTE — Progress Notes (Signed)
   Subjective:    Patient ID: Lance Hicks, male    DOB: September 28, 1946, 73 y.o.   MRN: [id number]  HPI  Virtual Visit via Telephone Note  I connected with Lance Hicks on 11/28/19 at  3:20 PM EST by telephone and verified that I am speaking with the correct person using two identifiers.  Location:  Patient: home Provider: office  Pt did not check vitals signs.   I discussed the limitations, risks, security and privacy concerns of performing an evaluation and management service by telephone and the availability of in person appointments. I also discussed with the patient that there may be a patient responsible charge related to this service. The patient expressed understanding and agreed to proceed.    History of Present Illness:  Pt continues to have persistent anxiety despite using low dose buspar 7.5 mg bid. He is not reporting any depression.  He states feels great when he works out and can relieve stress. Low level anxiety present on daily basis.   He wonders if can go up on dose or try different medication.     Observations/Objective: General-no acute distress, pleasant, oriented. Normal speech.    Assessment and Plan:   For anxiety I want you to increase buspar to 15 mg twice daily.(double up on 7.5 mg dose).  Update me on this coming Monday if this worked or not. If not working then would try low dose effexor 37.5 mg daily and limited number of clonazepam.  Follow up to be determined or as needed  [name], PA-C   Follow Up Instructions:    I discussed the assessment and treatment plan with the patient. The patient was provided an opportunity to ask questions and all were answered. The patient agreed with the plan and demonstrated an understanding of the instructions.   The patient was advised to call back or seek an in-person evaluation if the symptoms worsen or if the condition fails to improve as anticipated.  I provided 25  minutes of  non-face-to-face time during this encounter.   [name], PA-C    Review of Systems  Constitutional: Negative for chills, fatigue and fever.  Respiratory: Negative for cough, chest tightness, shortness of breath and wheezing.   Cardiovascular: Negative for chest pain and palpitations.  Gastrointestinal: Negative for abdominal pain and constipation.  Genitourinary: Negative for dysuria and flank pain.  Musculoskeletal: Negative for back pain.  Skin: Negative for rash.  Neurological: Negative for dizziness, syncope, weakness, numbness and headaches.  Hematological: Negative for adenopathy. Does not bruise/bleed easily.  Psychiatric/Behavioral: Negative for behavioral problems, decreased concentration, dysphoric mood, sleep disturbance and suicidal ideas. The patient is nervous/anxious.        Objective:   Physical Exam        Assessment & Plan:

## 2019-12-04 ENCOUNTER — Telehealth: Payer: Self-pay | Admitting: *Deleted

## 2019-12-04 NOTE — Telephone Encounter (Signed)
Is pt doing ok without more meds?   F/u edward next available appointment

## 2019-12-04 NOTE — Telephone Encounter (Signed)
See visit from 11/28/19  Copied from [place] #[id number]. Topic: Quick Communication - See Telephone Encounter >> Dec 03, 2019  1:28 PM [place] wrote: CRM for notification. See Telephone encounter for: 12/03/19. 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

## 2019-12-05 MED ORDER — VENLAFAXINE HCL ER 37.5 MG PO CP24: 37.5000 mg | ORAL_CAPSULE | Freq: Every day | ORAL | 0 refills | Status: DC

## 2019-12-05 MED ORDER — CLONAZEPAM 0.5 MG PO TABS: ORAL_TABLET | ORAL | 0 refills | Status: DC

## 2019-12-05 NOTE — Telephone Encounter (Signed)
Able to get on line so decided to go ahead and rx effexor to take daily for anxiety. For mor severe anxiety/panic attack use clonazepam but counsel on using lower dose. Can advance to full tab if needed. Follow up in 2 weeks. Stop buspar.

## 2019-12-05 NOTE — Telephone Encounter (Signed)
Patient notified and he will call back to schedule the 2 week follow up.

## 2019-12-05 NOTE — Telephone Encounter (Signed)
Patient will await Lance Hicks return  Patient stated that he went back down to the 7.79m of buspar twice a day.  He is still having loose stools.  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  He would like to know if he was to change to the effexor would that have the same effect and also what are the possible side effects of this medication.  I did offer a virtual visit with EdPercell Millern Tuesday but patient declined and just would like to wait until Tuesday to hear back form [name].  I also did advise I can send back to another provider to review and he again stated that he would like to await [place].

## 2019-12-15 ENCOUNTER — Other Ambulatory Visit: Payer: Self-pay

## 2019-12-15 ENCOUNTER — Encounter (HOSPITAL_COMMUNITY): Payer: Self-pay | Admitting: Internal Medicine

## 2019-12-15 ENCOUNTER — Ambulatory Visit (HOSPITAL_COMMUNITY)
Admission: RE | Admit: 2019-12-15 | Discharge: 2019-12-15 | Disposition: A | Discharge status: Home / Self Care | Payer: Medicare Other | Source: Ambulatory Visit | Attending: Internal Medicine | Admitting: Internal Medicine

## 2019-12-15 VITALS — BP 122/70 | HR 57 | Wt 156.4 lb

## 2019-12-15 DIAGNOSIS — I272 Pulmonary hypertension, unspecified: Secondary | ICD-10-CM | POA: Diagnosis not present

## 2019-12-15 DIAGNOSIS — R0600 Dyspnea, unspecified: Secondary | ICD-10-CM

## 2019-12-15 DIAGNOSIS — Z951 Presence of aortocoronary bypass graft: Secondary | ICD-10-CM | POA: Insufficient documentation

## 2019-12-15 DIAGNOSIS — Z79899 Other long term (current) drug therapy: Secondary | ICD-10-CM | POA: Diagnosis not present

## 2019-12-15 DIAGNOSIS — Z880 Allergy status to penicillin: Secondary | ICD-10-CM | POA: Insufficient documentation

## 2019-12-15 DIAGNOSIS — E7849 Other hyperlipidemia: Secondary | ICD-10-CM | POA: Insufficient documentation

## 2019-12-15 DIAGNOSIS — Z952 Presence of prosthetic heart valve: Secondary | ICD-10-CM | POA: Diagnosis not present

## 2019-12-15 DIAGNOSIS — D509 Iron deficiency anemia, unspecified: Secondary | ICD-10-CM | POA: Diagnosis not present

## 2019-12-15 DIAGNOSIS — Z87442 Personal history of urinary calculi: Secondary | ICD-10-CM | POA: Diagnosis not present

## 2019-12-15 DIAGNOSIS — I2721 Secondary pulmonary arterial hypertension: Secondary | ICD-10-CM

## 2019-12-15 DIAGNOSIS — Z87891 Personal history of nicotine dependence: Secondary | ICD-10-CM | POA: Insufficient documentation

## 2019-12-15 DIAGNOSIS — K7460 Unspecified cirrhosis of liver: Secondary | ICD-10-CM | POA: Diagnosis not present

## 2019-12-15 DIAGNOSIS — Z7901 Long term (current) use of anticoagulants: Secondary | ICD-10-CM | POA: Insufficient documentation

## 2019-12-15 DIAGNOSIS — Z888 Allergy status to other drugs, medicaments and biological substances status: Secondary | ICD-10-CM | POA: Diagnosis not present

## 2019-12-15 DIAGNOSIS — Z825 Family history of asthma and other chronic lower respiratory diseases: Secondary | ICD-10-CM | POA: Diagnosis not present

## 2019-12-15 DIAGNOSIS — Z8261 Family history of arthritis: Secondary | ICD-10-CM | POA: Diagnosis not present

## 2019-12-15 DIAGNOSIS — I2510 Atherosclerotic heart disease of native coronary artery without angina pectoris: Secondary | ICD-10-CM | POA: Diagnosis not present

## 2019-12-15 DIAGNOSIS — I4892 Unspecified atrial flutter: Secondary | ICD-10-CM | POA: Insufficient documentation

## 2019-12-15 DIAGNOSIS — Z7982 Long term (current) use of aspirin: Secondary | ICD-10-CM | POA: Insufficient documentation

## 2019-12-15 DIAGNOSIS — I2781 Cor pulmonale (chronic): Secondary | ICD-10-CM | POA: Insufficient documentation

## 2019-12-15 DIAGNOSIS — Z8249 Family history of ischemic heart disease and other diseases of the circulatory system: Secondary | ICD-10-CM | POA: Insufficient documentation

## 2019-12-15 DIAGNOSIS — J9610 Chronic respiratory failure, unspecified whether with hypoxia or hypercapnia: Secondary | ICD-10-CM | POA: Diagnosis not present

## 2019-12-15 DIAGNOSIS — J449 Chronic obstructive pulmonary disease, unspecified: Secondary | ICD-10-CM | POA: Diagnosis not present

## 2019-12-15 DIAGNOSIS — I119 Hypertensive heart disease without heart failure: Secondary | ICD-10-CM | POA: Diagnosis not present

## 2019-12-15 LAB — BASIC METABOLIC PANEL
Anion gap: 12 (ref 5–15)
BUN: 19 mg/dL (ref 8–23)
CO2: 24 mmol/L (ref 22–32)
Calcium: 9.3 mg/dL (ref 8.9–10.3)
Chloride: 100 mmol/L (ref 98–111)
Creatinine, Ser: 1.41 mg/dL — ABNORMAL HIGH (ref 0.61–1.24)
GFR calc Af Amer: 57 mL/min — ABNORMAL LOW (ref >60)
GFR calc non Af Amer: 49 mL/min — ABNORMAL LOW (ref >60)
Glucose, Bld: 82 mg/dL (ref 70–99)
Potassium: 3.6 mmol/L (ref 3.5–5.1)
Sodium: 136 mmol/L (ref 135–145)

## 2019-12-15 LAB — CBC
HCT: 38.2 % — ABNORMAL LOW (ref 39.0–52.0)
Hemoglobin: 12.3 g/dL — ABNORMAL LOW (ref 13.0–17.0)
MCH: 29.1 pg (ref 26.0–34.0)
MCHC: 32.2 g/dL (ref 30.0–36.0)
MCV: 90.3 fL (ref 80.0–100.0)
Platelets: 187 10*3/uL (ref 150–400)
RBC: 4.23 MIL/uL (ref 4.22–5.81)
RDW: 14.3 % (ref 11.5–15.5)
WBC: 5.6 10*3/uL (ref 4.0–10.5)
nRBC: 0 % (ref 0.0–0.2)

## 2019-12-15 LAB — BRAIN NATRIURETIC PEPTIDE: B Natriuretic Peptide: 662.5 pg/mL — ABNORMAL HIGH (ref 0.0–100.0)

## 2019-12-15 MED ORDER — POTASSIUM CHLORIDE ER 20 MEQ PO TBCR: 40.0000 meq | EXTENDED_RELEASE_TABLET | Freq: Every day | ORAL | 2 refills | Status: DC

## 2019-12-15 MED ORDER — METOLAZONE 2.5 MG PO TABS: 2.5000 mg | ORAL_TABLET | Freq: Every day | ORAL | 3 refills | Status: DC | PRN

## 2019-12-15 NOTE — Patient Instructions (Signed)
Take Metolazone 2.5 mg AS NEEDED  When you take Metolazone take an extra 40 meq (2 tabs) of Potassium  Labs done today, we will notify you of abnormal results  Your physician recommends that you schedule a follow-up appointment in: 3 months with echocardiogram  If you have any questions or concerns before your next appointment please send [place] a message through [place] or call our office at [phone].  At the [facility], you and your health needs are our priority. As part of our continuing mission to provide you with exceptional heart care, we have created designated Provider Care Teams. These Care Teams include your primary Cardiologist (physician) and Advanced Practice Providers (APPs- Physician Assistants and Nurse Practitioners) who all work together to provide you with the care you need, when you need it.   You may see any of the following providers on your designated Care Team at your next follow up: [name] Dr Glori Bickers . Dr Loralie Champagne . Darrick Grinder, NP . Lyda Jester, PA . Audry Riles, PharmD   Please be sure to bring in all your medications bottles to every appointment.

## 2019-12-15 NOTE — Progress Notes (Signed)
ADVANCED HF CLINIC NOTE   Date:  12/15/2019   ID:  Lance Hicks, Lance Hicks [birth year], MRN [id number]  PCP:  [name], PA-C  Cardiologist:  Dr. 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 Also has COPD (quit smoking in 2002 - 1 ppd x 25 years), HL, and HTN.  In 8/18 admitted for acute pancreatitis. Underwent lap cholecystectomy on 07/29/2017. On 8/6 hedeveloped hypotension and was taken back for open laparotomy due to bleeding from an unclear omental source.He was given fluids and 8 units PRBC perioperatively. He says his breathing has been bad since that time.   Seen at [facility] in 2018 had hi-res CT, PFTs and VQ (low prob). Felt to have Golds I COPD with asthma overlap and early pulmonary fibrosis with PAH. Thought felt pulmonary pressures may have been elevated due to recent illness. Referred to Rheumatology though appt not until May. Suggested possible RHC.   He was also seen by Cardiology. Echocardiogram 8/18 as below showed normal LV function with septal flattening and RVSP 65-44mHG. There was no comment on RV size or function. 14-day event monitor showed 10-beat run NSVT. Felt to have chronic AF and PAH. No further testing ordered.  Underwent RHC on 01/18/18 with severe PAH and cor pulmonale. Macitentan and Adcirca.   Admitted 5/21-5/30/19 with symptomatic hypotension and weight gain. He had a repeat RHC and echo with bubble study (results below). He was started on midodrine for BP support. Diuresed 25 lbs with IV lasix, then transitioned to torsemide 60 mg daily. Abdominal [place]ore[place]owed cirrhosis with no evidence of portal HTN. Pulm consulted for possible ILD on High-res chest CT. Ortho consulted for right rotator cuff tear from  horse accident PTA. He was treated with antibiotics for Bacteremia. DC weight: 140 lbs.   He presents today for regular follow up. Remains on Adcirca and macitentan. He was unable to tolerate Uptravi due to diarrhea. Now taking torsemide 40 daily alternating with 60 daily. Has mild edema R > L.. Weight up about 8 pounds 138 -> 146. Very active. 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 Can go 2 miles at a time without too much problem up a 30 degree hill. No CP. No syncope.    Review of systems complete and found to be negative unless listed in HPI.   Studies:  RHC 5/21/19showed Mild/Moderate PAH in setting of high output with no evidence of intracardiac shunting. Hemodynamics as below.   High-Res Chest CT 05/16/18 1. Slightly irregular 0.9 cm peripheral right middle lobe solid pulmonary nodule. 3 month follow up recommended.  2. Dependent basilar predominant patchy subpleural reticulation and ground-glass attenuation with the suggestion of minimal associated traction bronchiolectasis. No frank honeycombing. Findings may indicate an interstitial lung disease such as early usual interstitial pneumonia (UIP) or fibrotic phase nonspecific interstitial pneumonia (NSIP). Suggest a follow-up high-resolution chest CT study in 6-12 months to assess temporal pattern stability, as clinically warranted. 3. Mild cardiomegaly. Minimal interlobular septal thickening and trace dependent bilateral pleural effusions.  4. Small to moderate volume perihepatic ascites. 5. Ectatic 4.3 cm ascending thoracic aorta. Recommend annual imaging followup by CTA or MRA.  6. Left main and 3 vessel coronary  atherosclerosis.  US Abdomen RUQ 05/16/18 - s/p cholecystectomy. + hepatic cirrhosis. Mild ascites.   Liver Doppler 05/16/18 - No hepatic, splenic, or portal venous thrombosis or occlusion. Mild ascites.  Echo with bubble study 05/16/18 LVEF 55-60% with "late bubbles"  , Mechanical AVR stable with trivial perivalvular regurg, Mild MR, Severe LAE, Severe RV dilation and reduced function. No PFO. Mod TR, PA peak pressure 68 mm Hg  [place]wan numbers5/24/19: PA 60-19 (32) CVP ~10 CO 5.4 CI 2.9  RHC 05/14/18 RA = 18 RV = 71/17 PA = 69/24 (39) PCW = 20 Fick cardiac output/index = 7.0/3.7 Thermo CO/CI = 7.2/3.8 PVR = 2.6 WU Ao sat = 97% PA sat = 73%, 73% SVC sat = 73%  RA sat = 72%  RHC 01/18/18 RA = 23 RV = 84/21 PA = 81/38 (53) PCW = 22 Fick cardiac output/index = 3.0/1.6 PVR = 10.4 WU FA sat = 98% on 2L  (checked on RA = 93%) PA sat = 60%, 61% SVC sat = 62%   Echo 8/18  1. LVEF is estimated to be 55%. 2. There is septal flattening consistent with right ventricular pressure and or volume overload. 3. A mechanical prosthetic aortic valve is present with mild AI 4. There is moderate to severe tricuspid regurgitation. 5. There is a trivial amount of pulmonic regurgitation. 6. The right ventricular systolic pressure is calculated at 65-36mHg. 7. The inferior vena cava is dilated with <50% inspiratory collapse, suggestive of an elevated right atrial pressure (14mg).  14 Day Patch 10/01/2017-10/15/2017:  -1 run of Ventricular Tachycardia occurred lasting 10 beats with a max rate of 240 bpm (avg 161 bpm). -Atrial Flutter occurred continuously (100% burden), ranging from 55-132 bpm (avg of 87 bpm).   NM Ventilation and Perfusion Lung Scan 10/31/2017:  Low probability of pulmonary embolism, using the PIOPED criteria.  Lower extremity venous duplex 07/25/2017:  1. Low probability of acute deep vein thrombosis in bilateral lower extremities. 2. Calf veins are poorly visualized but appear patent in limited segments visualized.   PFTs 07/02/2017:  FVC86% FEV185% FEV1/FVC 73% DLCO8.2, 32%   6MW -1400 feet; 97% to 85% (recovered with resting)   CT Chest07/08/2017: 1. Stable right  middle lobe pulmonary nodule measuring up to 1 cm. 2. Minimal subpleural reticulation without evidence of honeycombing to  suggest pulmonary fibrosis. Stable centrilobular emphysema. 3. Mediastinal lymphadenopathy, increased from prior. 4. Calcified right hilar lymph nodes and multiple calcified granulomas  throughout the right lung consistent with prior granulomatous disease. 5. Ascending thoracic aortic aneurysm measuring up to 4.5 cm, unchanged. 6. Dilatation of the main pulmonary artery suggestive of pulmonary  hypertension. 7. Cardiomegaly and dense atherosclerotic coronary artery calcifications.    Past Medical History:  Diagnosis Date  . 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 In light of concurrent left nephrolithiasis, will check CT renal colic next year instead of renal [place]  . Marland Kitchennticoagulated on Coumadin 01/04/2018  . Anxiety 05/10/2016   Last Assessment & Plan:  Doing well off of zoloft.  . Asthma   . Asymptomatic microscopic hematuria 06/20/2017   Last Assessment & Plan:  [facility] and [facility]. UA with 2+ blood last visit - we discussed recommendation for repeat workup at 5 years or if degree of hematuria progresses.   . Atrial fibrillation ([place]4/04/2017  . Atrial flutter ([place]4/04/2017  . Chronic allergic rhinitis 04/28/2016  Last Assessment & Plan:  Continue astelin  . Chronic anticoagulation 03/29/2017  . Chronic atrial fibrillation ([place]) 07/23/2015   Last Assessment & Plan:  Coumadin and metoprolol, cardiology referral to establish care.  . Chronic midline back pain 12/14/2015   Last Assessment & Plan:  Pain management referral for further evaluation.  . Chronic prostatitis 07/23/2015   Last Assessment & Plan:  Has largely resolved since stopping bike riding. Recommend annual DRE AND PSA - will see back 12/2015 for annual screening, given 1st degree fhx. To call office  for recurrent prostatitis symptoms.   [name] COPD (chronic obstructive pulmonary disease) ([place]) 06/12/2016  . Coronary artery disease involving native coronary artery of native heart with angina pectoris ([place]) 03/29/2017  . Cough 10/17/2016   Last Assessment & Plan:  Discussed typical course for acute viral illness. If symptoms worsen or fail to improve by 7-10d, delayed ATBs, fluids, rest, NSAIDs/APAP prn. Seek care if not improving. Needs earlier INR check due to ATBs.  [name] Dyspnea 02/01/2016   Last Assessment & Plan:  Overall improving, eval by pulm, plan for CT, neg stress test with cardiology. Recent switch to carvedilol due to side effects.  . Epidermoid cyst of skin 08/24/2017  . Essential hypertension 12/14/2015   Last Assessment & Plan:  Hypertension control: controlled  Medications: compliant Medication Management: as noted in orders Home blood pressure monitoring recommended additionally as needed for symptoms  The patient's care plan was reviewed and updated. Instructions and counseling were provided regarding patient goals and barriers. He was counseled to adopt a healthy lifestyle. Educational resources and self-management tools have been provided as charted in [facility] list.   . H/O maze procedure 03/29/2017  . [name] 03/29/2017   Overview:  2011  . Hx of CABG 03/29/2017  . Hyperlipidemia 03/29/2017  . Hypertensive heart disease 03/29/2017  . Kidney stones 07/23/2015   Overview:  x 3  Last Assessment & Plan:  By [place] has left nephrolithiasis, but not visible by KUB. Will check CT renal colic next year to assess both stone burden as well as to surveil AML.   . Leukocytoclastic vasculitis ([place]) 10/01/2017  . Localized edema 01/04/2018  . Lumbar radicular pain 01/19/2016  . Maculopapular rash 09/03/2017  . Nephrolithiasis 07/23/2015   Overview:  x 3  Last Assessment & Plan:  By [place] has left nephrolithiasis, but not visible by KUB. Will check CT renal colic next year to assess both stone  burden as well as to surveil AML.   Overview:  x 3  Last Assessment & Plan:  Has 39m nonobstructing LUP stone - not visible by KUB.  Will check renal [place]/2019 - he will contact office sooner if symptomatic.   . Non-sustained ventricular tachycardia ([place] 03/29/2017  . Other hyperlipidemia 03/29/2017  . Palpitations 10/01/2017  . Paroxysmal atrial fibrillation ([place] 03/29/2017  . Paroxysmal atrial fibrillation ([place] 03/29/2017  . Pleural effusion, bilateral 08/09/2017  . Post-nasal drainage 02/25/2016   Last Assessment & Plan:  Trial zyrtec and flonase  . Prostate cancer screening 06/20/2017   Last Assessment & Plan:  Recommend continued annual CaP screening until within 10 years of life expectancy. Given good health and fhx of longevity, would anticipate CaP screening to continue until age 73  PSA today and again in one year on day of visit.  . Pulmonary hypertension ([place] 08/09/2017  . Pulmonary nodules 06/12/2016  . S/P AVR (aortic valve replacement) 03/20/2016  . Supratherapeutic INR 07/26/2017  .  Syncope 03/29/2017  . Syncope and collapse 02/01/2016  . Typical atrial flutter ([place]) 02/01/2016    Past Surgical History:  Procedure Laterality Date  . CHOLECYSTECTOMY    . CORONARY ARTERY BYPASS GRAFT    . EXPLORATORY LAPAROTOMY  07/30/2017  . FOOT SURGERY    . FRACTURE SURGERY Right    wrist and forearm  . HERNIA REPAIR    . MECHANICAL AORTIC VALVE REPLACEMENT    . NASAL SINUS SURGERY    . RIGHT HEART CATH N/A 01/18/2018   Procedure: RIGHT HEART CATH;  Surgeon: Jolaine Artist, MD;  Location: [place] [facility];  Service: Cardiovascular;  Laterality: N/A;  . RIGHT HEART CATH N/A 05/14/2018   Procedure: RIGHT HEART CATH;  Surgeon: Jolaine Artist, MD;  Location: [place] [facility];  Service: Cardiovascular;  Laterality: N/A;  . TEE WITHOUT CARDIOVERSION N/A 05/21/2018   Procedure: TRANSESOPHAGEAL ECHOCARDIOGRAM (TEE);  Surgeon: Jolaine Artist, MD;  Location: [facility] ENDOSCOPY;  Service:  Cardiovascular;  Laterality: N/A;  . UPPER GASTROINTESTINAL ENDOSCOPY  07/12/2017   Patchy areas of mucosal inflammation noted in the antrum with edema,erthema and ulcerations. Bx. Chronicfocally active gastritis.  [name] VASECTOMY      Current Medications: Current Meds  Medication Sig  . acetaminophen (TYLENOL) 650 MG CR tablet Take 1 tablet (650 mg total) by mouth every 8 (eight) hours as needed for pain. Not to use with norco since combined with tylenol.  [name] albuterol (VENTOLIN HFA) 108 (90 Base) MCG/ACT inhaler Inhale 2 puffs into the lungs every 4 (four) hours as needed for wheezing or shortness of breath.  [name] aspirin EC 81 MG tablet Take 81 mg by mouth daily.  . B Complex-C (B-COMPLEX WITH VITAMIN C) tablet Take 1 tablet by mouth daily.   . cetirizine (ZYRTEC ALLERGY) 10 MG tablet Take 10 mg by mouth daily as needed for allergies.   . Cholecalciferol (VITAMIN D) 125 MCG (5000 UT) CAPS Take 1 capsule by mouth daily.  . Coenzyme Q10 (COQ10 PO) Take 1 tablet by mouth every other day.   . macitentan (OPSUMIT) 10 MG tablet Take 1 tablet (10 mg total) by mouth daily.  . Magnesium Gluconate (MAGNESIUM 27 PO) Take 1 tablet by mouth daily.  . midodrine (PROAMATINE) 10 MG tablet Take 1 tablet (10 mg total) by mouth 3 (three) times daily with meals.  [name] omeprazole (PRILOSEC) 20 MG capsule Take 20 mg by mouth daily.   . potassium chloride 20 MEQ TBCR Take 40 mEq by mouth daily.  . rosuvastatin (CRESTOR) 5 MG tablet TAKE 1 TABLET(5 MG) BY MOUTH DAILY  . spironolactone (ALDACTONE) 25 MG tablet TAKE 1/2 OF A TABLET BY MOUTH ONCE DAILY.  . tadalafil, PAH, (ADCIRCA) 20 MG tablet Take 2 tablets (40 mg total) by mouth daily.  [name] torsemide (DEMADEX) 20 MG tablet Take 20 mg by mouth as directed. Takes 1.5 - 2 tablets daily  . venlafaxine XR (EFFEXOR XR) 37.5 MG 24 hr capsule Take 1 capsule (37.5 mg total) by mouth daily with breakfast. (Patient taking differently: Take 37.5 mg by mouth as needed. )  . warfarin  (COUMADIN) 5 MG tablet TAKE AS DIRECTED BY COUMADIN CLINIC  . [DISCONTINUED] hydrocortisone (ANUSOL-HC) 2.5 % rectal cream APP REC AA BID FOR 10 DAYS     Allergies:   Protonix [pantoprazole sodium], Amoxicillin-pot clavulanate, and Tape   Social History   Socioeconomic History  . Marital status: Single    Spouse name: Not on file  . Number  of children: Not on file  . Years of education: Not on file  . Highest education level: Not on file  Occupational History  . Not on file  Tobacco Use  . Smoking status: Former Smoker    Packs/day: 2.00    Years: 34.00    Pack years: 68.00    Types: Cigarettes    Quit date: 07/16/2000    Years since quitting: 19.4  . Smokeless tobacco: Never Used  Substance and Sexual Activity  . Alcohol use: Not Currently  . Drug use: No  . Sexual activity: Not on file  Other Topics Concern  . Not on file  Social History Narrative  . Not on file   Social Determinants of Health   Financial Resource Strain:   . Difficulty of Paying Living Expenses: Not on file  Food Insecurity:   . Worried About Charity fundraiser in the Last Year: Not on file  . Ran Out of Food in the Last Year: Not on file  Transportation Needs:   . Lack of Transportation (Medical): Not on file  . Lack of Transportation (Non-Medical): Not on file  Physical Activity:   . Days of Exercise per Week: Not on file  . Minutes of Exercise per Session: Not on file  Stress:   . Feeling of Stress : Not on file  Social Connections:   . Frequency of Communication with Friends and Family: Not on file  . Frequency of Social Gatherings with Friends and Family: Not on file  . Attends Religious Services: Not on file  . Active Member of Clubs or Organizations: Not on file  . Attends Archivist Meetings: Not on file  . Marital Status: Not on file     Family History: The patient's family history includes Arthritis in his mother; Asthma in his mother; Heart attack in his father;  Hypertension in his father; Stroke in his paternal grandmother. There is no history of Colon cancer.   Recent Labs: 06/06/2019: B Natriuretic Peptide 618.3 10/09/2019: ALT 14; BUN 24; Creatinine, Ser 1.30; Hemoglobin 13.8; Platelets 213.0; Potassium 3.7; Sodium 133  Recent Lipid Panel    Component Value Date/Time   CHOL 125 10/21/2018 1028   TRIG 57 10/21/2018 1028   HDL 43 10/21/2018 1028   CHOLHDL 2.9 10/21/2018 1028   VLDL 11 10/21/2018 1028   LDLCALC 71 10/21/2018 1028    Physical Exam:    VS:  BP 122/70   Pulse (!) 57   Wt 70.9 kg (156 lb 6.4 oz)   SpO2 98%   BMI 22.77 kg/m     Wt Readings from Last 3 Encounters:  12/15/19 70.9 kg (156 lb 6.4 oz)  11/14/19 64 kg (141 lb)  11/03/19 71.2 kg (157 lb)    General:  Well appearing. No resp difficulty HEENT: normal Neck: supple. JVP to jaw + prominent CV waves. Carotids 2+ bilat; + bruits. No lymphadenopathy or thryomegaly appreciated. Cor: PMI nondisplaced. Irregular rate & rhythm. 2/6 AS P2 pronounced 3/6 TR Lungs: clear with decreased BS throguhout Abdomen: soft, nontender, nondistended. No hepatosplenomegaly. No bruits or masses. Good bowel sounds. Extremities: no cyanosis, clubbing, rash, 1+ edema Neuro: alert & orientedx3, cranial nerves grossly intact. moves all 4 extremities w/o difficulty. Affect pleasant  ECG AFL 63 wit iRBBB Personally reviewed   ASSESSMENT/PLAN:    1. Pulmonary hypertension with cor pulmonale/RV failure - WHO Group I  -? Component ofHHT/shunt/AVMs with late bubbles on bubble study.  - Echo with bubble study  05/16/18 LVEF 55-60% with "late bubbles" , Mechanical AVR stable with trivial perivalvular regurg, Mild MR, Severe LAE, Severe RV dilation and reduced function. No PFO. Mod TR, PA peak pressure 68 mm Hg - Echo 10/19: EF 55-60% mild LVH; s/p AVR with mean gradient 11 mmHg and trace AI; mild MR; severe biatrial enlargement; mild RVE; severe TR; severe pulmonary hypertension. RVSP 38mHG -  RHC 1/19 with mod/severe pulm HTN with RV failure.  - RHC 05/14/18 with Mild/Moderate PAH in setting of high output with no evidence of intracardiac shunting. - Ab u/s with cirrhosis but no evidence of portal HTN - Improved with Macitentan and Adcirca. Unable to tolerate Uptravi due to diarrhea - NYHA II. Volume status elevated today. Continue torsemide 40 daily alternating with 60 daily. Will use metolazone 2.568mwith K 40 meq as needed to keep fluid in check - Continue spiro 12.5 mg daily. - Continue midodrine 10 mg tid (will take 1536mf BP is low) - Auto-immune serologies negative (checked twice) - F/u 6 months with echo. Consider repeat RHC if needed - labs today  2. Chronic respiratory failure - [place] CT 5/23/19with "dependent basilar predominant patchy subpleural reticulation and ground-glass attenuation with the suggestion of minimal associated traction bronchiolectasis. No frank honeycombing. Findings may indicate an interstitial lung disease such as early usual interstitial pneumonia (UIP) or fibrotic phase nonspecific interstitial pneumonia (NSIP)." -Followed by Dr. ByrLamonte Sakai. Chronic AFL - Rate controlled.  Now off metoprolol. Continue coumadin with mechanical AVR. - No bleeding. Check labs tody  4. CAD - s/p CABG 07/2017 with Dr AllRoderic Palau [place]- No s/s ischemia - continue ASA with CAD and AVR - Unable to tolerate atorva due to myalgias.  -ContinueCrestor 5mg13mily. Tolerating well   5. S/p mechanical AVR - stable on most recent echo. Continue coumadin/ASA 81. INR goal is 2.5-3 per Dr MunlBettina Gaviaaware of need for SBE prophylaxis  6. Leukocytoclastic vasculitis - f/u with Rheumatology. No change.  7. Cirrhosis - US [place] RUQ 05/16/18 - s/p cholecystectomy. + hepatic cirrhosis. Mild ascites.  - Liver Doppler 05/16/18 - No hepatic, splenic, or portal venous thrombosis or occlusion. Mild ascites. - Continue midodrine 10 mg am, afternoon, 15 mg HS  8.  Iron-def anemia - followed by PCP   DaniGlori Bickers  11:45 AM

## 2019-12-15 NOTE — Addendum Note (Signed)
Encounter addended by: Scarlette Calico, RN on: 12/15/2019 12:14 PM  Actions taken: Visit diagnoses modified, Pharmacy for encounter modified, Order list changed, Diagnosis association updated, Clinical Note Signed, Charge Capture section accepted

## 2019-12-23 ENCOUNTER — Telehealth: Payer: Self-pay | Admitting: Cardiology

## 2019-12-23 NOTE — Telephone Encounter (Signed)
Telephone call to patient . Wants to know if he can take Vitamin K2. Informed him that it would interfere with his coumadin and not to take it. Pt verbalized understanding.

## 2019-12-23 NOTE — Telephone Encounter (Signed)
New Message:     Pt wants to know if he can take Vitamin K 2?

## 2019-12-29 ENCOUNTER — Other Ambulatory Visit (HOSPITAL_COMMUNITY): Payer: Self-pay

## 2019-12-29 MED ORDER — MIDODRINE HCL 10 MG PO TABS: 10.0000 mg | ORAL_TABLET | Freq: Three times a day (TID) | ORAL | 6 refills | Status: DC

## 2020-01-06 ENCOUNTER — Other Ambulatory Visit: Payer: Self-pay

## 2020-01-06 ENCOUNTER — Ambulatory Visit (INDEPENDENT_AMBULATORY_CARE_PROVIDER_SITE_OTHER): Payer: Medicare Other | Admitting: Pharmacist

## 2020-01-06 DIAGNOSIS — Z5181 Encounter for therapeutic drug level monitoring: Secondary | ICD-10-CM

## 2020-01-06 DIAGNOSIS — I4892 Unspecified atrial flutter: Secondary | ICD-10-CM

## 2020-01-06 DIAGNOSIS — Z7901 Long term (current) use of anticoagulants: Secondary | ICD-10-CM

## 2020-01-06 LAB — POCT INR: INR: 2 (ref 2.0–3.0)

## 2020-01-06 NOTE — Patient Instructions (Signed)
Take 1.5 tablet tonight start taking 35m daily except 2.542mon Monday, Wed and Friday. Please call our office with any medication changes or concerns [phone]. Return in 3 weeks for INR check.

## 2020-01-11 ENCOUNTER — Other Ambulatory Visit: Payer: Self-pay | Admitting: Medical

## 2020-01-19 ENCOUNTER — Telehealth (HOSPITAL_COMMUNITY): Payer: Self-pay | Admitting: Pharmacist

## 2020-01-19 NOTE — Telephone Encounter (Signed)
Advanced Heart Failure Patient Advocate Encounter  Prior Authorization for [employer] has been approved.    Effective dates: Start Date:12/20/2019;Coverage End Date:01/18/2021;  Audry Riles, PharmD, BCPS, BCCP, CPP Heart Failure Clinic Pharmacist [phone]

## 2020-01-27 ENCOUNTER — Other Ambulatory Visit: Payer: Self-pay

## 2020-01-27 ENCOUNTER — Ambulatory Visit (INDEPENDENT_AMBULATORY_CARE_PROVIDER_SITE_OTHER): Payer: Medicare Other | Admitting: Pharmacist

## 2020-01-27 ENCOUNTER — Telehealth: Payer: Self-pay | Admitting: Medical

## 2020-01-27 DIAGNOSIS — Z5181 Encounter for therapeutic drug level monitoring: Secondary | ICD-10-CM

## 2020-01-27 DIAGNOSIS — Z7901 Long term (current) use of anticoagulants: Secondary | ICD-10-CM | POA: Diagnosis not present

## 2020-01-27 DIAGNOSIS — I4892 Unspecified atrial flutter: Secondary | ICD-10-CM | POA: Diagnosis not present

## 2020-01-27 DIAGNOSIS — R252 Cramp and spasm: Secondary | ICD-10-CM

## 2020-01-27 LAB — POCT INR: INR: 2.2 (ref 2.0–3.0)

## 2020-01-27 NOTE — Telephone Encounter (Signed)
Patient states that he is having leg cramps, would like to know if he needs lay off or take more potassium.

## 2020-01-28 ENCOUNTER — Other Ambulatory Visit: Payer: Self-pay | Admitting: Cardiology

## 2020-01-28 NOTE — Telephone Encounter (Signed)
See if we can get him scheduled for cmp and mg level tomorrow or Friday. Want to find cause if possible and not assume low k.. Too much potassium not good so not to increase presently.

## 2020-01-29 NOTE — Telephone Encounter (Signed)
Patient notified and is scheduled for tomorrow at 215pm for labs.

## 2020-01-30 ENCOUNTER — Other Ambulatory Visit: Payer: Self-pay

## 2020-01-30 ENCOUNTER — Other Ambulatory Visit (INDEPENDENT_AMBULATORY_CARE_PROVIDER_SITE_OTHER): Payer: Medicare Other

## 2020-01-30 DIAGNOSIS — R252 Cramp and spasm: Secondary | ICD-10-CM | POA: Diagnosis not present

## 2020-01-31 LAB — COMPREHENSIVE METABOLIC PANEL
AG Ratio: 1.2 (calc) (ref 1.0–2.5)
ALT: 15 U/L (ref 9–46)
AST: 32 U/L (ref 10–35)
Albumin: 4.3 g/dL (ref 3.6–5.1)
Alkaline phosphatase (APISO): 60 U/L (ref 35–144)
BUN/Creatinine Ratio: 27 (calc) — ABNORMAL HIGH (ref 6–22)
BUN: 40 mg/dL — ABNORMAL HIGH (ref 7–25)
CO2: 25 mmol/L (ref 20–32)
Calcium: 9.4 mg/dL (ref 8.6–10.3)
Chloride: 98 mmol/L (ref 98–110)
Creat: 1.47 mg/dL — ABNORMAL HIGH (ref 0.70–1.18)
Globulin: 3.6 g/dL (calc) (ref 1.9–3.7)
Glucose, Bld: 86 mg/dL (ref 65–99)
Potassium: 3.8 mmol/L (ref 3.5–5.3)
Sodium: 134 mmol/L — ABNORMAL LOW (ref 135–146)
Total Bilirubin: 2.3 mg/dL — ABNORMAL HIGH (ref 0.2–1.2)
Total Protein: 7.9 g/dL (ref 6.1–8.1)

## 2020-01-31 LAB — MAGNESIUM: Magnesium: 2.3 mg/dL (ref 1.5–2.5)

## 2020-02-03 ENCOUNTER — Telehealth: Payer: Self-pay | Admitting: Medical

## 2020-02-03 NOTE — Telephone Encounter (Signed)
Please call patient for lab results

## 2020-02-03 NOTE — Telephone Encounter (Signed)
Patient notified of results.

## 2020-02-08 NOTE — Progress Notes (Signed)
Cardiology Office Note:    Date:  02/10/2020   ID:  Lance, Hicks 1946/09/02, MRN [id number]  PCP:  [name], PA-C  Cardiologist:  Shirlee More, MD    Referring MD: [name], PA-C    ASSESSMENT:    1. Atrial flutter, unspecified type ([name])   2. Anticoagulated on Coumadin   3. S/P AVR   4. CAD in native artery   5. PAH (pulmonary artery hypertension) ([place])   6. Chronic anticoagulation   7. Hypotension, chronic    PLAN:    In order of problems listed above:  1. Atrial flutter is a stable escape rhythm for him does not need a pacemaker continue anticoagulation INR goal 2.5-3 with prosthetic AVR and atrial flutter. 2. Stable CAD continue medical treatment 3. Pulmonary hypertension continue current treatment directed by advanced heart clinic 4. Continue anticoagulation managed in our clinic 5. Continue midodrine which has been remarkably effective in allowing to accept standard treatments for heart failure and pulmonary artery hypertension 6. Hyperlipidemia continue statin   Next appointment: Months   Medication Adjustments/Labs and Tests Ordered: Current medicines are reviewed at length with the patient today.  Concerns regarding medicines are outlined above.  No orders of the defined types were placed in this encounter.  No orders of the defined types were placed in this encounter.   Chief Complaint  Patient presents with   Follow-up   Atrial Flutter   Anticoagulation   Congestive Heart Failure    History of Present Illness:    Lance Hicks is a 74 y.o. male with a hx of permanent atrial fibrillation coronary artery disease aortic stenosis CABG aortic valve replacement #25 carbo metrics valve 2010 Maze procedure and left atrial appendage excision and pulmonary artery hypertension treated with Macitentan and Adcirca .  He has also has had a significant decline in GFR with CKD and symptomatic hypotension   He was last seen  11/03/2019. Compliance with diet, lifestyle and medications: Yes  Overall is doing well unfortunately is having recurrent renal calculi have requested name of urology and I gave him Dr. Rosario Adie at [facility] in [facility] urology.  From a cardiac perspective he is doing well he has no symptomatic hypotension he exercises not having edema shortness of breath chest pain palpitation or syncope.  Recent labs and notes from advanced heart failure reviewed.    I reviewed the office note of advanced heart failure Dr. Haroldine Laws that I share management with from 12/15/2019.  His pulmonary hypertension with cor pulmonale was stable chronic respiratory failure with stable continue to rate controlled atrial flutter and is limiting hypotension was improved with preserved exercise tolerance.  Laboratory test ordered by Dr. Haroldine Laws independently reviewed magnesium 2.3 and CMP showed chronic stable CKD creatinine 1.47 potassium 3.8 bilirubin chronic elevation 2.3 from 01/30/2020.  Ref Range & Units 12 d ago 1 mo ago 2 mo ago  INR 2.0 - 3.0 2.2  2.0  2.4     Past Medical History:  Diagnosis Date   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   Last Assessment & Plan:  Stable in size on annual imaging. In light of concurrent left nephrolithiasis, will check CT renal colic next year instead of renal US.    Anticoagulated on Coumadin 01/04/2018   Anxiety 05/10/2016   Last Assessment & Plan:  Doing well off of zoloft.   Asthma    Asymptomatic microscopic hematuria 06/20/2017   Last Assessment & Plan:  Had hematuria work[place] in  [place] in 2016 which negative CT and cystoscopy. UA with 2+ blood last visit - we discussed recommendation for repeat workup at 5 years or if degree of hematuria progresses.    Atrial fibrillation ([name]) 03/29/2017   Atrial flutter ([name]) 03/29/2017   Chronic allergic rhinitis 04/28/2016   Last Assessment & Plan:  Continue astelin   Chronic anticoagulation 03/29/2017   Chronic atrial  fibrillation ([name]) 07/23/2015   Last Assessment & Plan:  Coumadin and metoprolol, cardiology referral to establish care.   Chronic midline back pain 12/14/2015   Last Assessment & Plan:  Pain management referral for further evaluation.   Chronic prostatitis 07/23/2015   Last Assessment & Plan:  Has largely resolved since stopping bike riding. Recommend annual DRE AND PSA - will see back 12/2015 for annual screening, given 1st degree fhx. To call office for recurrent prostatitis symptoms.    COPD (chronic obstructive pulmonary disease) ([name]) 06/12/2016   Coronary artery disease involving native coronary artery of native heart with angina pectoris ([name]) 03/29/2017   Cough 10/17/2016   Last Assessment & Plan:  Discussed typical course for acute viral illness. If symptoms worsen or fail to improve by 7-10d, delayed ATBs, fluids, rest, NSAIDs/APAP prn. Seek care if not improving. Needs earlier INR check due to ATBs.   Dyspnea 02/01/2016   Last Assessment & Plan:  Overall improving, eval by pulm, plan for CT, neg stress test with cardiology. Recent switch to carvedilol due to side effects.   Epidermoid cyst of skin 08/24/2017   Essential hypertension 12/14/2015   Last Assessment & Plan:  Hypertension control: controlled  Medications: compliant Medication Management: as noted in orders Home blood pressure monitoring recommended additionally as needed for symptoms  The patient's care plan was reviewed and updated. Instructions and counseling were provided regarding patient goals and barriers. He was counseled to adopt a healthy lifestyle. Educational resources and self-management tools have been provided as charted in [facility] list.    H/O maze procedure 03/29/2017   [name] 03/29/2017   Overview:  2011   Hx of CABG 03/29/2017   Hyperlipidemia 03/29/2017   Hypertensive heart disease 03/29/2017   Kidney stones 07/23/2015   Overview:  x 3  Last Assessment & Plan:  By [place] has left  nephrolithiasis, but not visible by KUB. Will check CT renal colic next year to assess both stone burden as well as to surveil AML.    Leukocytoclastic vasculitis ([name]) 10/01/2017   Localized edema 01/04/2018   Lumbar radicular pain 01/19/2016   Maculopapular rash 09/03/2017   Nephrolithiasis 07/23/2015   Overview:  x 3  Last Assessment & Plan:  By [place] has left nephrolithiasis, but not visible by KUB. Will check CT renal colic next year to assess both stone burden as well as to surveil AML.   Overview:  x 3  Last Assessment & Plan:  Has 63m nonobstructing LUP stone - not visible by KUB.  Will check renal [place]2019 - he will contact office sooner if symptomatic.    [place]-sustained ventricular tachycardia [place] 03/29/2017   Other hyperlipidemia 03/29/2017   Palpitations 10/01/2017   Paroxysmal atrial fibrillation ([facility]) 03/29/2017   [place] fibrillation [place] 03/29/2017   Pleural effusion, bilateral 08/09/2017   Post-nasal drainage 02/25/2016   Last Assessment & Plan:  Trial zyrtec and flonase   Prostate cancer screening 06/20/2017   Last Assessment & Plan:  Recommend continued annual CaP screening until within 10 years of life expectancy. Given good health  and fhx of longevity, would anticipate CaP screening to continue until age 83.  PSA today and again in one year on day of visit.   Pulmonary hypertension ([place]) 08/09/2017   Pulmonary nodules 06/12/2016   S/P AVR (aortic valve replacement) 03/20/2016   Supratherapeutic INR 07/26/2017   Syncope 03/29/2017   Syncope and collapse 02/01/2016   Typical atrial flutter ([place]) 02/01/2016    Past Surgical History:  Procedure Laterality Date   CHOLECYSTECTOMY     CORONARY ARTERY BYPASS GRAFT     EXPLORATORY LAPAROTOMY  07/30/2017   FOOT SURGERY     FRACTURE SURGERY Right    wrist and forearm   HERNIA REPAIR     MECHANICAL AORTIC VALVE REPLACEMENT     NASAL SINUS SURGERY     RIGHT HEART CATH N/A 01/18/2018   Procedure: RIGHT HEART  CATH;  Surgeon: Jolaine Artist, MD;  Location: [place] [facility] LAB;  Service: Cardiovascular;  Laterality: N/A;   RIGHT HEART CATH N/A 05/14/2018   Procedure: RIGHT HEART CATH;  Surgeon: Jolaine Artist, MD;  Location: [place] [facility];  Service: Cardiovascular;  Laterality: N/A;   TEE WITHOUT CARDIOVERSION N/A 05/21/2018   Procedure: TRANSESOPHAGEAL ECHOCARDIOGRAM (TEE);  Surgeon: Jolaine Artist, MD;  Location: [facility] ENDOSCOPY;  Service: Cardiovascular;  Laterality: N/A;   UPPER GASTROINTESTINAL ENDOSCOPY  07/12/2017   Patchy areas of mucosal inflammation noted in the antrum with edema,erthema and ulcerations. Bx. Chronicfocally active gastritis.   VASECTOMY      Current Medications: Current Meds  Medication Sig   acetaminophen (TYLENOL) 650 MG CR tablet Take 1 tablet (650 mg total) by mouth every 8 (eight) hours as needed for pain. Not to use with norco since combined with tylenol.   albuterol (VENTOLIN HFA) 108 (90 Base) MCG/ACT inhaler Inhale 2 puffs into the lungs every 4 (four) hours as needed for wheezing or shortness of breath.   aspirin EC 81 MG tablet Take 81 mg by mouth daily.   B Complex-C (B-COMPLEX WITH VITAMIN C) tablet Take 1 tablet by mouth daily.    Cholecalciferol (VITAMIN D) 125 MCG (5000 UT) CAPS Take 1 capsule by mouth daily.   macitentan (OPSUMIT) 10 MG tablet Take 1 tablet (10 mg total) by mouth daily.   Magnesium Gluconate (MAGNESIUM 27 PO) Take 1 tablet by mouth daily.   metolazone (ZAROXOLYN) 2.5 MG tablet Take 1 tablet (2.5 mg total) by mouth daily as needed (for weight gain).   midodrine (PROAMATINE) 10 MG tablet Take 1 tablet (10 mg total) by mouth 3 (three) times daily with meals.   omeprazole (PRILOSEC) 20 MG capsule Take 20 mg by mouth daily.    Potassium Chloride ER 20 MEQ TBCR Take 40 mEq by mouth daily. Take an extra 2 tabs when you take Metolazone   rosuvastatin (CRESTOR) 5 MG tablet TAKE 1 TABLET(5 MG) BY MOUTH DAILY    spironolactone (ALDACTONE) 25 MG tablet TAKE 1/2 OF A TABLET BY MOUTH ONCE DAILY.   tadalafil, PAH, (ADCIRCA) 20 MG tablet Take 2 tablets (40 mg total) by mouth daily.   torsemide (DEMADEX) 20 MG tablet Take 20 mg by mouth as directed. Takes 1.5 - 2 tablets daily   venlafaxine XR (EFFEXOR-XR) 37.5 MG 24 hr capsule TAKE 1 CAPSULE(37.5 MG) BY MOUTH DAILY WITH BREAKFAST   warfarin (COUMADIN) 5 MG tablet TAKE AS DIRECTED BY COUMADIN CLINIC     Allergies:   Protonix [pantoprazole sodium], Amoxicillin-pot clavulanate, and Tape   Social History  Socioeconomic History   Marital status: Single    Spouse name: Not on file   Number of children: Not on file   Years of education: Not on file   Highest education level: Not on file  Occupational History   Not on file  Tobacco Use   Smoking status: Former Smoker    Packs/day: 2.00    Years: 34.00    Pack years: 68.00    Types: Cigarettes    Quit date: 07/16/2000    Years since quitting: 19.5   Smokeless tobacco: Never Used  Substance and Sexual Activity   Alcohol use: Not Currently   Drug use: No   Sexual activity: Not on file  Other Topics Concern   Not on file  Social History Narrative   Not on file   Social Determinants of Health   Financial Resource Strain:    Difficulty of Paying Living Expenses: Not on file  Food Insecurity:    Worried About [place] in the Last Year: Not on file   [employer] of Food in the Last Year: Not on file  Transportation Needs:    Lack of Transportation (Medical): Not on file   Lack of Transportation (Non-Medical): Not on file  Physical Activity:    Days of Exercise per Week: Not on file   Minutes of Exercise per Session: Not on file  Stress:    Feeling of Stress : Not on file  Social Connections:    Frequency of Communication with Friends and Family: Not on file   Frequency of Social Gatherings with Friends and Family: Not on file   Attends Religious Services:  Not on file   Active Member of Clubs or Organizations: Not on file   Attends Archivist Meetings: Not on file   Marital Status: Not on file     Family History: The patient's family history includes Arthritis in his mother; Asthma in his mother; Heart attack in his father; Hypertension in his father; Stroke in his paternal grandmother. There is no history of Colon cancer. ROS:   Please see the history of present illness.    All other systems reviewed and are negative.  EKGs/Labs/Other Studies Reviewed:    The following studies were reviewed today:  EKG:  EKG 05/14/2019 reviewed personally demonstrates atrial flutter narrow QRS rhythm 60 to 65 bpm nonspecific ST-T  Recent Labs: 12/15/2019: B Natriuretic Peptide 662.5; Hemoglobin 12.3; Platelets 187 01/30/2020: ALT 15; BUN 40; Creat 1.47; Magnesium 2.3; Potassium 3.8; Sodium 134  Recent Lipid Panel    Component Value Date/Time   CHOL 125 10/21/2018 1028   TRIG 57 10/21/2018 1028   HDL 43 10/21/2018 1028   CHOLHDL 2.9 10/21/2018 1028   VLDL 11 10/21/2018 1028   LDLCALC 71 10/21/2018 1028    Physical Exam:    VS:  BP 140/86    Pulse 73    Temp 98 F (36.7 C)    Ht 5' 9.5" (1.765 m)    Wt 160 lb 6.4 oz (72.8 kg)    SpO2 92%    BMI 23.35 kg/m     Wt Readings from Last 3 Encounters:  02/10/20 160 lb 6.4 oz (72.8 kg)  12/15/19 156 lb 6.4 oz (70.9 kg)  11/14/19 141 lb (64 kg)     GEN:  Well nourished, well developed in no acute distress HEENT: Normal NECK: No JVD; No carotid bruits LYMPHATICS: No lymphadenopathy CARDIAC: Irregular sharp closing sound no murmur S1 variable no murmurs,  rubs, gallops RESPIRATORY:  Clear to auscultation without rales, wheezing or rhonchi  ABDOMEN: Soft, non-tender, non-distended MUSCULOSKELETAL:  No edema; No deformity  SKIN: Warm and dry NEUROLOGIC:  Alert and oriented x 3 PSYCHIATRIC:  Normal affect    Signed, Shirlee More, MD  02/10/2020 3:47 PM    [facility]  HeartCare

## 2020-02-10 ENCOUNTER — Ambulatory Visit (INDEPENDENT_AMBULATORY_CARE_PROVIDER_SITE_OTHER): Payer: Medicare Other | Admitting: *Deleted

## 2020-02-10 ENCOUNTER — Ambulatory Visit (INDEPENDENT_AMBULATORY_CARE_PROVIDER_SITE_OTHER): Payer: Medicare Other | Admitting: Cardiology

## 2020-02-10 ENCOUNTER — Encounter: Payer: Self-pay | Admitting: Cardiology

## 2020-02-10 ENCOUNTER — Other Ambulatory Visit: Payer: Self-pay

## 2020-02-10 VITALS — BP 140/86 | HR 73 | Temp 98.0°F | Ht 69.5 in | Wt 160.4 lb

## 2020-02-10 DIAGNOSIS — I9589 Other hypotension: Secondary | ICD-10-CM | POA: Diagnosis not present

## 2020-02-10 DIAGNOSIS — Z952 Presence of prosthetic heart valve: Secondary | ICD-10-CM

## 2020-02-10 DIAGNOSIS — Z7901 Long term (current) use of anticoagulants: Secondary | ICD-10-CM | POA: Diagnosis not present

## 2020-02-10 DIAGNOSIS — I4892 Unspecified atrial flutter: Secondary | ICD-10-CM

## 2020-02-10 DIAGNOSIS — I2721 Secondary pulmonary arterial hypertension: Secondary | ICD-10-CM

## 2020-02-10 DIAGNOSIS — I2510 Atherosclerotic heart disease of native coronary artery without angina pectoris: Secondary | ICD-10-CM

## 2020-02-10 DIAGNOSIS — Z5181 Encounter for therapeutic drug level monitoring: Secondary | ICD-10-CM

## 2020-02-10 LAB — POCT INR: INR: 2.4 (ref 2.0–3.0)

## 2020-02-10 NOTE — Patient Instructions (Signed)
Medication Instructions:  Your physician recommends that you continue on your current medications as directed. Please refer to the Current Medication list given to you today.  *If you need a refill on your cardiac medications before your next appointment, please call your pharmacy*  Lab Work: None ordered  If you have labs (blood work) drawn today and your tests are completely normal, you will receive your results only by: [name] MyChart Message (if you have MyChart) OR . A paper copy in the mail If you have any lab test that is abnormal or we need to change your treatment, we will call you to review the results.  Testing/Procedures: None ordered  Follow-Up: At [facility], you and your health needs are our priority.  As part of our continuing mission to provide you with exceptional heart care, we have created designated Provider Care Teams.  These Care Teams include your primary Cardiologist (physician) and Advanced Practice Providers (APPs -  Physician Assistants and Nurse Practitioners) who all work together to provide you with the care you need, when you need it.  Your next appointment:   3 month(s)  The format for your next appointment:   In Person  Provider:   Shirlee More, MD  Other Instructions

## 2020-02-10 NOTE — Patient Instructions (Signed)
Increase warfarin to 1 tablet daily except 1/2 tablet on Mondays and Thursdays.  Please call our office with any medication changes or concerns [phone]. Return in 3 weeks for INR check.

## 2020-02-26 ENCOUNTER — Other Ambulatory Visit (HOSPITAL_COMMUNITY): Payer: Self-pay

## 2020-02-26 MED ORDER — SPIRONOLACTONE 25 MG PO TABS: ORAL_TABLET | ORAL | 3 refills | Status: DC

## 2020-03-02 ENCOUNTER — Other Ambulatory Visit: Payer: Self-pay

## 2020-03-02 ENCOUNTER — Ambulatory Visit (INDEPENDENT_AMBULATORY_CARE_PROVIDER_SITE_OTHER): Payer: Medicare Other | Admitting: Pharmacist Clinician (PhC)/ Clinical Pharmacy Specialist

## 2020-03-02 DIAGNOSIS — Z7901 Long term (current) use of anticoagulants: Secondary | ICD-10-CM | POA: Diagnosis not present

## 2020-03-02 DIAGNOSIS — I4892 Unspecified atrial flutter: Secondary | ICD-10-CM | POA: Diagnosis not present

## 2020-03-02 DIAGNOSIS — Z5181 Encounter for therapeutic drug level monitoring: Secondary | ICD-10-CM | POA: Diagnosis not present

## 2020-03-02 LAB — POCT INR: INR: 4.7 — AB (ref 2.0–3.0)

## 2020-03-02 NOTE — Patient Instructions (Signed)
Hold warfarin today Tuesday March 9, then take only 1/2 tablet on Wednesday March 10.  After that continue with 1 tablet daily except 1/2 tablet on Mondays and Thursdays.  Please call our office with any medication changes or concerns [phone]. Return in 2 weeks for INR check.

## 2020-03-16 ENCOUNTER — Ambulatory Visit (INDEPENDENT_AMBULATORY_CARE_PROVIDER_SITE_OTHER): Payer: Medicare Other | Admitting: Pharmacist

## 2020-03-16 ENCOUNTER — Other Ambulatory Visit: Payer: Self-pay

## 2020-03-16 DIAGNOSIS — Z7901 Long term (current) use of anticoagulants: Secondary | ICD-10-CM

## 2020-03-16 DIAGNOSIS — I4892 Unspecified atrial flutter: Secondary | ICD-10-CM

## 2020-03-16 DIAGNOSIS — Z5181 Encounter for therapeutic drug level monitoring: Secondary | ICD-10-CM

## 2020-03-16 LAB — POCT INR: INR: 3.3 — AB (ref 2.0–3.0)

## 2020-03-16 NOTE — Patient Instructions (Signed)
Hold warfarin today Tuesday March 23, then start taking 1 tablet daily except 1/2 tablet on Mondays, Thursdays and Saturdays.  Please call our office with any medication changes or concerns [phone]. Return in 2 weeks for INR check.

## 2020-03-17 ENCOUNTER — Encounter (HOSPITAL_COMMUNITY): Payer: Self-pay | Admitting: Internal Medicine

## 2020-03-17 ENCOUNTER — Ambulatory Visit (HOSPITAL_BASED_OUTPATIENT_CLINIC_OR_DEPARTMENT_OTHER)
Admission: RE | Admit: 2020-03-17 | Discharge: 2020-03-17 | Disposition: A | Discharge status: Home / Self Care | Payer: Medicare Other | Source: Ambulatory Visit | Attending: Internal Medicine | Admitting: Internal Medicine

## 2020-03-17 ENCOUNTER — Ambulatory Visit (HOSPITAL_COMMUNITY)
Admission: RE | Admit: 2020-03-17 | Discharge: 2020-03-17 | Disposition: A | Discharge status: Home / Self Care | Payer: Medicare Other | Source: Ambulatory Visit | Attending: Internal Medicine | Admitting: Internal Medicine

## 2020-03-17 VITALS — BP 140/80 | HR 68 | Wt 150.6 lb

## 2020-03-17 DIAGNOSIS — I272 Pulmonary hypertension, unspecified: Secondary | ICD-10-CM

## 2020-03-17 DIAGNOSIS — F419 Anxiety disorder, unspecified: Secondary | ICD-10-CM | POA: Diagnosis not present

## 2020-03-17 DIAGNOSIS — I4892 Unspecified atrial flutter: Secondary | ICD-10-CM | POA: Insufficient documentation

## 2020-03-17 DIAGNOSIS — Z823 Family history of stroke: Secondary | ICD-10-CM | POA: Diagnosis not present

## 2020-03-17 DIAGNOSIS — Z880 Allergy status to penicillin: Secondary | ICD-10-CM | POA: Insufficient documentation

## 2020-03-17 DIAGNOSIS — E785 Hyperlipidemia, unspecified: Secondary | ICD-10-CM | POA: Diagnosis not present

## 2020-03-17 DIAGNOSIS — Z7982 Long term (current) use of aspirin: Secondary | ICD-10-CM | POA: Insufficient documentation

## 2020-03-17 DIAGNOSIS — Z79899 Other long term (current) drug therapy: Secondary | ICD-10-CM | POA: Diagnosis not present

## 2020-03-17 DIAGNOSIS — Z9049 Acquired absence of other specified parts of digestive tract: Secondary | ICD-10-CM | POA: Insufficient documentation

## 2020-03-17 DIAGNOSIS — Z952 Presence of prosthetic heart valve: Secondary | ICD-10-CM | POA: Insufficient documentation

## 2020-03-17 DIAGNOSIS — Z951 Presence of aortocoronary bypass graft: Secondary | ICD-10-CM | POA: Insufficient documentation

## 2020-03-17 DIAGNOSIS — Z87442 Personal history of urinary calculi: Secondary | ICD-10-CM | POA: Insufficient documentation

## 2020-03-17 DIAGNOSIS — I2510 Atherosclerotic heart disease of native coronary artery without angina pectoris: Secondary | ICD-10-CM | POA: Diagnosis not present

## 2020-03-17 DIAGNOSIS — J9610 Chronic respiratory failure, unspecified whether with hypoxia or hypercapnia: Secondary | ICD-10-CM | POA: Insufficient documentation

## 2020-03-17 DIAGNOSIS — I776 Arteritis, unspecified: Secondary | ICD-10-CM | POA: Insufficient documentation

## 2020-03-17 DIAGNOSIS — I5032 Chronic diastolic (congestive) heart failure: Secondary | ICD-10-CM | POA: Diagnosis not present

## 2020-03-17 DIAGNOSIS — Z825 Family history of asthma and other chronic lower respiratory diseases: Secondary | ICD-10-CM | POA: Diagnosis not present

## 2020-03-17 DIAGNOSIS — I110 Hypertensive heart disease with heart failure: Secondary | ICD-10-CM | POA: Diagnosis not present

## 2020-03-17 DIAGNOSIS — I4821 Permanent atrial fibrillation: Secondary | ICD-10-CM | POA: Diagnosis not present

## 2020-03-17 DIAGNOSIS — J449 Chronic obstructive pulmonary disease, unspecified: Secondary | ICD-10-CM | POA: Diagnosis not present

## 2020-03-17 DIAGNOSIS — K7460 Unspecified cirrhosis of liver: Secondary | ICD-10-CM | POA: Insufficient documentation

## 2020-03-17 DIAGNOSIS — Z888 Allergy status to other drugs, medicaments and biological substances status: Secondary | ICD-10-CM | POA: Diagnosis not present

## 2020-03-17 DIAGNOSIS — Z8249 Family history of ischemic heart disease and other diseases of the circulatory system: Secondary | ICD-10-CM | POA: Insufficient documentation

## 2020-03-17 DIAGNOSIS — E7849 Other hyperlipidemia: Secondary | ICD-10-CM | POA: Diagnosis not present

## 2020-03-17 DIAGNOSIS — I50810 Right heart failure, unspecified: Secondary | ICD-10-CM | POA: Diagnosis not present

## 2020-03-17 DIAGNOSIS — Z8261 Family history of arthritis: Secondary | ICD-10-CM | POA: Diagnosis not present

## 2020-03-17 DIAGNOSIS — Z7901 Long term (current) use of anticoagulants: Secondary | ICD-10-CM | POA: Diagnosis not present

## 2020-03-17 DIAGNOSIS — R188 Other ascites: Secondary | ICD-10-CM | POA: Insufficient documentation

## 2020-03-17 DIAGNOSIS — I2781 Cor pulmonale (chronic): Secondary | ICD-10-CM | POA: Insufficient documentation

## 2020-03-17 DIAGNOSIS — Z87891 Personal history of nicotine dependence: Secondary | ICD-10-CM | POA: Diagnosis not present

## 2020-03-17 DIAGNOSIS — D509 Iron deficiency anemia, unspecified: Secondary | ICD-10-CM | POA: Insufficient documentation

## 2020-03-17 LAB — COMPREHENSIVE METABOLIC PANEL
ALT: 19 U/L (ref 0–44)
AST: 36 U/L (ref 15–41)
Albumin: 4 g/dL (ref 3.5–5.0)
Alkaline Phosphatase: 60 U/L (ref 38–126)
Anion gap: 13 (ref 5–15)
BUN: 28 mg/dL — ABNORMAL HIGH (ref 8–23)
CO2: 25 mmol/L (ref 22–32)
Calcium: 9.5 mg/dL (ref 8.9–10.3)
Chloride: 100 mmol/L (ref 98–111)
Creatinine, Ser: 1.47 mg/dL — ABNORMAL HIGH (ref 0.61–1.24)
GFR calc Af Amer: 54 mL/min — ABNORMAL LOW (ref >60)
GFR calc non Af Amer: 47 mL/min — ABNORMAL LOW (ref >60)
Glucose, Bld: 90 mg/dL (ref 70–99)
Potassium: 3.6 mmol/L (ref 3.5–5.1)
Sodium: 138 mmol/L (ref 135–145)
Total Bilirubin: 2.2 mg/dL — ABNORMAL HIGH (ref 0.3–1.2)
Total Protein: 8.4 g/dL — ABNORMAL HIGH (ref 6.5–8.1)

## 2020-03-17 LAB — CBC
HCT: 42.1 % (ref 39.0–52.0)
Hemoglobin: 13.1 g/dL (ref 13.0–17.0)
MCH: 26.1 pg (ref 26.0–34.0)
MCHC: 31.1 g/dL (ref 30.0–36.0)
MCV: 84 fL (ref 80.0–100.0)
Platelets: 212 10*3/uL (ref 150–400)
RBC: 5.01 MIL/uL (ref 4.22–5.81)
RDW: 19.9 % — ABNORMAL HIGH (ref 11.5–15.5)
WBC: 6.8 10*3/uL (ref 4.0–10.5)
nRBC: 0 % (ref 0.0–0.2)

## 2020-03-17 LAB — BRAIN NATRIURETIC PEPTIDE: B Natriuretic Peptide: 509.4 pg/mL — ABNORMAL HIGH (ref 0.0–100.0)

## 2020-03-17 MED ORDER — UPTRAVI 200 MCG PO TABS: 200.0000 ug | ORAL_TABLET | Freq: Two times a day (BID) | ORAL | Status: DC

## 2020-03-17 NOTE — Patient Instructions (Signed)
Labs done today, we will call you with results  We will restart you on Uptravi, specialty pharmacy will call you to set this up  Your physician recommends that you schedule a follow-up appointment in: 3 months  If you have any questions or concerns before your next appointment please send [place] a message through [place] or call our office at [phone].

## 2020-03-17 NOTE — Progress Notes (Signed)
  Echocardiogram 2D Echocardiogram has been performed.  Koven Belinsky A Rudolf Blizard 03/17/2020, 11:00 AM

## 2020-03-17 NOTE — Addendum Note (Signed)
Encounter addended by: Scarlette Calico, RN on: 03/17/2020 1:18 PM  Actions taken: Diagnosis association updated, Order list changed, Clinical Note Signed

## 2020-03-17 NOTE — Progress Notes (Signed)
ADVANCED HF CLINIC NOTE   Date:  03/17/2020   ID:  Turhan, Chill 1946-05-20, MRN [id number]  PCP:  [name], PA-C  Cardiologist:  Dr. 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 Also has COPD (quit smoking in 2002 - 1 ppd x 25 years), HL, and HTN.  In 8/18 admitted for acute pancreatitis. Underwent lap cholecystectomy on 07/29/2017. On 8/6 hedeveloped hypotension and was taken back for open laparotomy due to bleeding from an unclear omental source.He was given fluids and 8 units PRBC perioperatively. He says his breathing has been bad since that time.   Seen at [place] in 2018 had hi-res CT, PFTs and VQ (low prob). Felt to have Golds I COPD with asthma overlap and early pulmonary fibrosis with PAH. Thought felt pulmonary pressures may have been elevated due to recent illness. Referred to Rheumatology though appt not until May. Suggested possible RHC.   He was also seen by Cardiology. Echocardiogram 8/18 as below showed normal LV function with septal flattening and RVSP 65-70mHG. There was no comment on RV size or function. 14-day event monitor showed 10-beat run NSVT. Felt to have chronic AF and PAH. No further testing ordered.  Underwent RHC on 01/18/18 with severe PAH and cor pulmonale. Macitentan and Adcirca.   Admitted 5/21-5/30/19 with symptomatic hypotension and weight gain. He had a repeat RHC and echo with bubble study (results below). He was started on midodrine for BP support. Diuresed 25 lbs with IV lasix, then transitioned to torsemide 60 mg daily. Abdominal UKoreashowed cirrhosis with no evidence of portal HTN. Pulm consulted for possible ILD on High-res chest CT. Ortho consulted for right rotator cuff tear from horse  accident PTA. He was treated with antibiotics for Bacteremia. DC weight: 140 lbs.   He presents today for regular follow up. Remains on Adcirca and macitentan. He was unable to tolerate Uptravi due to diarrhea. Now taking torsemide 40 daily alternating with 60 daily. Overall doing pretty well. Takes metolazone about 2x/month and says he pays for it for 2 days with cramps. 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 A little winded going up the hill. Just bought a TM and is putting it together.   Echo today EF 45-50% AVR ok. Septal flattening RV dilated mild HK. Moderate TR RVSP 80 severe bilateral enlargement. Personally reviewed   Review of systems complete and found to be negative unless listed in HPI.   Studies:  RHC 5/21/19showed Mild/Moderate PAH in setting of high output with no evidence of intracardiac shunting. Hemodynamics as below.   High-Res Chest CT 05/16/18 1. Slightly irregular 0.9 cm peripheral right middle lobe solid pulmonary nodule. 3 month follow up recommended.  2. Dependent basilar predominant patchy subpleural reticulation and ground-glass attenuation with the suggestion of minimal associated traction bronchiolectasis. No frank honeycombing. Findings may indicate an interstitial lung disease such as early usual interstitial pneumonia (UIP) or fibrotic phase nonspecific interstitial pneumonia (NSIP). Suggest a follow-up high-resolution chest CT study in 6-12 months to assess temporal pattern stability, as clinically warranted. 3. Mild cardiomegaly. Minimal interlobular septal thickening and trace dependent bilateral pleural effusions.  4. Small to moderate volume perihepatic ascites. 5. Ectatic 4.3  cm ascending thoracic aorta. Recommend annual imaging followup by CTA or MRA.  6. Left main and 3 vessel coronary atherosclerosis.  US Abdomen RUQ 05/16/18 - s/p cholecystectomy. + hepatic cirrhosis. Mild ascites.   Liver Doppler 05/16/18 - No  hepatic, splenic, or portal venous thrombosis or occlusion. Mild ascites.  Echo with bubble study 05/16/18 LVEF 55-60% with "late bubbles" , Mechanical AVR stable with trivial perivalvular regurg, Mild MR, Severe LAE, Severe RV dilation and reduced function. No PFO. Mod TR, PA peak pressure 68 mm Hg  [place] numbers5/24/19: PA 60-19 (32) CVP ~10 CO 5.4 CI 2.9  RHC 05/14/18 RA = 18 RV = 71/17 PA = 69/24 (39) PCW = 20 Fick cardiac output/index = 7.0/3.7 Thermo CO/CI = 7.2/3.8 PVR = 2.6 WU Ao sat = 97% PA sat = 73%, 73% SVC sat = 73%  RA sat = 72%  RHC 01/18/18 RA = 23 RV = 84/21 PA = 81/38 (53) PCW = 22 Fick cardiac output/index = 3.0/1.6 PVR = 10.4 WU FA sat = 98% on 2L  (checked on RA = 93%) PA sat = 60%, 61% SVC sat = 62%   Echo 8/18  1. LVEF is estimated to be 55%. 2. There is septal flattening consistent with right ventricular pressure and or volume overload. 3. A mechanical prosthetic aortic valve is present with mild AI 4. There is moderate to severe tricuspid regurgitation. 5. There is a trivial amount of pulmonic regurgitation. 6. The right ventricular systolic pressure is calculated at 65-68mHg. 7. The inferior vena cava is dilated with <50% inspiratory collapse, suggestive of an elevated right atrial pressure (171mg).  14 Day Patch 10/01/2017-10/15/2017:  -1 run of Ventricular Tachycardia occurred lasting 10 beats with a max rate of 240 bpm (avg 161 bpm). -Atrial Flutter occurred continuously (100% burden), ranging from 55-132 bpm (avg of 87 bpm).   NM Ventilation and Perfusion Lung Scan 10/31/2017:  Low probability of pulmonary embolism, using the PIOPED criteria.  Lower extremity venous duplex 07/25/2017:  1. Low probability of acute deep vein thrombosis in bilateral lower extremities. 2. Calf veins are poorly visualized but appear patent in limited segments visualized.   PFTs 07/02/2017:  FVC86% FEV185% FEV1/FVC 73% DLCO8.2,  32%   6MW -1400 feet; 97% to 85% (recovered with resting)   CT Chest07/08/2017: 1. Stable right middle lobe pulmonary nodule measuring up to 1 cm. 2. Minimal subpleural reticulation without evidence of honeycombing to  suggest pulmonary fibrosis. Stable centrilobular emphysema. 3. Mediastinal lymphadenopathy, increased from prior. 4. Calcified right hilar lymph nodes and multiple calcified granulomas  throughout the right lung consistent with prior granulomatous disease. 5. Ascending thoracic aortic aneurysm measuring up to 4.5 cm, unchanged. 6. Dilatation of the main pulmonary artery suggestive of pulmonary  hypertension. 7. Cardiomegaly and dense atherosclerotic coronary artery calcifications.    Past Medical History:  Diagnosis Date  . 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 In light of concurrent left nephrolithiasis, will check CT renal colic next year instead of renal [place]  . Marland Kitchennticoagulated on Coumadin 01/04/2018  . Anxiety 05/10/2016   Last Assessment & Plan:  Doing well off of zoloft.  . Asthma   . Asymptomatic microscopic hematuria 06/20/2017   Last Assessment & Plan:  [facility]. UA with 2+ blood last visit - we discussed recommendation for repeat workup at 5 years or if degree of hematuria  progresses.   . Atrial fibrillation ([place]) 03/29/2017  . Atrial flutter ([place]) 03/29/2017  . Chronic allergic rhinitis 04/28/2016   Last Assessment & Plan:  Continue astelin  . Chronic anticoagulation 03/29/2017  . Chronic atrial fibrillation ([place]) 07/23/2015   Last Assessment & Plan:  Coumadin and metoprolol, cardiology referral to establish care.  . Chronic midline back pain 12/14/2015   Last Assessment & Plan:  Pain management referral for further evaluation.  . Chronic prostatitis 07/23/2015   Last Assessment & Plan:  Has largely resolved  since stopping bike riding. Recommend annual DRE AND PSA - will see back 12/2015 for annual screening, given 1st degree fhx. To call office for recurrent prostatitis symptoms.   [name] COPD (chronic obstructive pulmonary disease) ([place]) 06/12/2016  . Coronary artery disease involving native coronary artery of native heart with angina pectoris ([place]) 03/29/2017  . Cough 10/17/2016   Last Assessment & Plan:  Discussed typical course for acute viral illness. If symptoms worsen or fail to improve by 7-10d, delayed ATBs, fluids, rest, NSAIDs/APAP prn. Seek care if not improving. Needs earlier INR check due to ATBs.  [name] Dyspnea 02/01/2016   Last Assessment & Plan:  Overall improving, eval by pulm, plan for CT, neg stress test with cardiology. Recent switch to carvedilol due to side effects.  . Epidermoid cyst of skin 08/24/2017  . Essential hypertension 12/14/2015   Last Assessment & Plan:  Hypertension control: controlled  Medications: compliant Medication Management: as noted in orders Home blood pressure monitoring recommended additionally as needed for symptoms  The patient's care plan was reviewed and updated. Instructions and counseling were provided regarding patient goals and barriers. He was counseled to adopt a healthy lifestyle. Educational resources and self-management tools have been provided as charted in [facility] list.   . H/O maze procedure 03/29/2017  . [name] 03/29/2017   Overview:  2011  . Hx of CABG 03/29/2017  . Hyperlipidemia 03/29/2017  . Hypertensive heart disease 03/29/2017  . Kidney stones 07/23/2015   Overview:  x 3  Last Assessment & Plan:  By [place] has left nephrolithiasis, but not visible by KUB. Will check CT renal colic next year to assess both stone burden as well as to surveil AML.   . Leukocytoclastic vasculitis ([place]) 10/01/2017  . Localized edema 01/04/2018  . Lumbar radicular pain 01/19/2016  . Maculopapular rash 09/03/2017  . Nephrolithiasis 07/23/2015   Overview:  x 3   Last Assessment & Plan:  By [place] has left nephrolithiasis, but not visible by KUB. Will check CT renal colic next year to assess both stone burden as well as to surveil AML.   Overview:  x 3  Last Assessment & Plan:  Has 62m nonobstructing LUP stone - not visible by KUB.  Will check renal [place]8/2019 - he will contact office sooner if symptomatic.   . Non-sustained ventricular tachycardia ([place] 03/29/2017  . Other hyperlipidemia 03/29/2017  . Palpitations 10/01/2017  . Paroxysmal atrial fibrillation ([place] 03/29/2017  . Paroxysmal atrial fibrillation ([place] 03/29/2017  . Pleural effusion, bilateral 08/09/2017  . Post-nasal drainage 02/25/2016   Last Assessment & Plan:  Trial zyrtec and flonase  . Prostate cancer screening 06/20/2017   Last Assessment & Plan:  Recommend continued annual CaP screening until within 10 years of life expectancy. Given good health and fhx of longevity, would anticipate CaP screening to continue until age 723  PSA today and again in one year on day of visit.  . Pulmonary hypertension (  [place]) 08/09/2017  . Pulmonary nodules 06/12/2016  . S/P AVR (aortic valve replacement) 03/20/2016  . Supratherapeutic INR 07/26/2017  . Syncope 03/29/2017  . Syncope and collapse 02/01/2016  . Typical atrial flutter ([place]) 02/01/2016    Past Surgical History:  Procedure Laterality Date  . CHOLECYSTECTOMY    . CORONARY ARTERY BYPASS GRAFT    . EXPLORATORY LAPAROTOMY  07/30/2017  . FOOT SURGERY    . FRACTURE SURGERY Right    wrist and forearm  . HERNIA REPAIR    . MECHANICAL AORTIC VALVE REPLACEMENT    . NASAL SINUS SURGERY    . RIGHT HEART CATH N/A 01/18/2018   Procedure: RIGHT HEART CATH;  Surgeon: Jolaine Artist, MD;  Location: [facility];  Service: Cardiovascular;  Laterality: N/A;  . RIGHT HEART CATH N/A 05/14/2018   Procedure: RIGHT HEART CATH;  Surgeon: Jolaine Artist, MD;  Location: [facility];  Service: Cardiovascular;  Laterality: N/A;  . TEE WITHOUT CARDIOVERSION N/A  05/21/2018   Procedure: TRANSESOPHAGEAL ECHOCARDIOGRAM (TEE);  Surgeon: Jolaine Artist, MD;  Location: [facility] ENDOSCOPY;  Service: Cardiovascular;  Laterality: N/A;  . UPPER GASTROINTESTINAL ENDOSCOPY  07/12/2017   Patchy areas of mucosal inflammation noted in the antrum with edema,erthema and ulcerations. Bx. Chronicfocally active gastritis.  [name] VASECTOMY      Current Medications: Current Meds  Medication Sig  . acetaminophen (TYLENOL) 650 MG CR tablet Take 1 tablet (650 mg total) by mouth every 8 (eight) hours as needed for pain. Not to use with norco since combined with tylenol.  [name] albuterol (VENTOLIN HFA) 108 (90 Base) MCG/ACT inhaler Inhale 2 puffs into the lungs every 4 (four) hours as needed for wheezing or shortness of breath.  [name] aspirin EC 81 MG tablet Take 81 mg by mouth daily.  . B Complex-C (B-COMPLEX WITH VITAMIN C) tablet Take 1 tablet by mouth daily.   . Cholecalciferol (VITAMIN D) 125 MCG (5000 UT) CAPS Take 1 capsule by mouth daily.  . macitentan (OPSUMIT) 10 MG tablet Take 1 tablet (10 mg total) by mouth daily.  . Magnesium Gluconate (MAGNESIUM 27 PO) Take 1 tablet by mouth daily.  . metolazone (ZAROXOLYN) 2.5 MG tablet Take 1 tablet (2.5 mg total) by mouth daily as needed (for weight gain).  . midodrine (PROAMATINE) 10 MG tablet Take 1 tablet (10 mg total) by mouth 3 (three) times daily with meals.  [name] omeprazole (PRILOSEC) 20 MG capsule Take 20 mg by mouth daily.   . Potassium Chloride ER 20 MEQ TBCR Take 40 mEq by mouth daily. Take an extra 2 tabs when you take Metolazone  . rosuvastatin (CRESTOR) 5 MG tablet TAKE 1 TABLET(5 MG) BY MOUTH DAILY  . spironolactone (ALDACTONE) 25 MG tablet TAKE 1/2 OF A TABLET BY MOUTH ONCE DAILY.  . tadalafil, PAH, (ADCIRCA) 20 MG tablet Take 2 tablets (40 mg total) by mouth daily.  [name] torsemide (DEMADEX) 20 MG tablet Take 20 mg by mouth as directed. Takes 2-3 tablets daily  . venlafaxine XR (EFFEXOR-XR) 37.5 MG 24 hr capsule TAKE 1  CAPSULE(37.5 MG) BY MOUTH DAILY WITH BREAKFAST  . warfarin (COUMADIN) 5 MG tablet TAKE AS DIRECTED BY COUMADIN CLINIC     Allergies:   Protonix [pantoprazole sodium], Amoxicillin-pot clavulanate, and Tape   Social History   Socioeconomic History  . Marital status: Single    Spouse name: Not on file  . Number of children: Not on file  . Years of education: Not on file  .  Highest education level: Not on file  Occupational History  . Not on file  Tobacco Use  . Smoking status: Former Smoker    Packs/day: 2.00    Years: 34.00    Pack years: 68.00    Types: Cigarettes    Quit date: 07/16/2000    Years since quitting: 19.6  . Smokeless tobacco: Never Used  Substance and Sexual Activity  . Alcohol use: Not Currently  . Drug use: No  . Sexual activity: Not on file  Other Topics Concern  . Not on file  Social History Narrative  . Not on file   Social Determinants of Health   Financial Resource Strain:   . Difficulty of Paying Living Expenses:   Food Insecurity:   . Worried About Charity fundraiser in the Last Year:   . Arboriculturist in the Last Year:   Transportation Needs:   . Film/video editor (Medical):   [name] Lack of Transportation (Non-Medical):   Physical Activity:   . Days of Exercise per Week:   . Minutes of Exercise per Session:   Stress:   . Feeling of Stress :   Social Connections:   . Frequency of Communication with Friends and Family:   . Frequency of Social Gatherings with Friends and Family:   . Attends Religious Services:   . Active Member of Clubs or Organizations:   . Attends Archivist Meetings:   [name] Marital Status:      Family History: The patient's family history includes Arthritis in his mother; Asthma in his mother; Heart attack in his father; Hypertension in his father; Stroke in his paternal grandmother. There is no history of Colon cancer.   Recent Labs: 12/15/2019: B Natriuretic Peptide 662.5; Hemoglobin 12.3; Platelets 187  01/30/2020: ALT 15; BUN 40; Creat 1.47; Magnesium 2.3; Potassium 3.8; Sodium 134  Recent Lipid Panel    Component Value Date/Time   CHOL 125 10/21/2018 1028   TRIG 57 10/21/2018 1028   HDL 43 10/21/2018 1028   CHOLHDL 2.9 10/21/2018 1028   VLDL 11 10/21/2018 1028   LDLCALC 71 10/21/2018 1028    Physical Exam:    VS:  BP 140/80   Pulse 68   Wt 68.3 kg (150 lb 9.6 oz)   SpO2 98%   BMI 21.92 kg/m     Wt Readings from Last 3 Encounters:  03/17/20 68.3 kg (150 lb 9.6 oz)  02/10/20 72.8 kg (160 lb 6.4 oz)  12/15/19 70.9 kg (156 lb 6.4 oz)    General:  Well appearing. No resp difficulty HEENT: normal Neck: supple. JVP 7 . Carotids 2+ bilat; no bruits. No lymphadenopathy or thryomegaly appreciated. Cor: PMI nondisplaced. irregular rate & rhythm. Mechanical s2 2/6 TR Lungs: clear Abdomen: soft, nontender, nondistended. No hepatosplenomegaly. No bruits or masses. Good bowel sounds. Extremities: no cyanosis, clubbing, rash, tr edema chronic venous stasis changes Neuro: alert & orientedx3, cranial nerves grossly intact. moves all 4 extremities w/o difficulty. Affect pleasant   ASSESSMENT/PLAN:    1. Pulmonary hypertension with cor pulmonale/RV failure - WHO Group I  -? Component ofHHT/shunt/AVMs with late bubbles on bubble study.  - Echo with bubble study 05/16/18 LVEF 55-60% with "late bubbles" , Mechanical AVR stable with trivial perivalvular regurg, Mild MR, Severe LAE, Severe RV dilation and reduced function. No PFO. Mod TR, PA peak pressure 68 mm Hg - Echo 10/19: EF 55-60% mild LVH; s/p AVR with mean gradient 11 mmHg and trace AI; mild MR;  severe biatrial enlargement; mild RVE; severe TR; severe pulmonary hypertension. RVSP 96mHG - Echo today 03/17/20 EF 45-50% AVR ok. Septal flattening RV dilated mild HK. Moderate TR RVSP 80 severe bilateral enlargement. Personally reviewed - RHC 1/19 with mod/severe pulm HTN with RV failure.  - RHC 05/14/18 with Mild/Moderate PAH in setting of  high output with no evidence of intracardiac shunting. - Ab u/s with cirrhosis but no evidence of portal HTN - Improved with Macitentan and Adcirca. Unable to tolerate Uptravi due to diarrhea at higher doses. Will rechallenge and see if he can tolerate lower doses. If not, consider Tyvaso.  - Stable NYHA II - early III - Volume status ok today - Continue torsemide 40 daily alternating with 60 daily. Using metolazone 2.573mwith K 40 meq as needed to keep fluid in check but gets severe cramps. Will check labs - Continue spiro 12.5 mg daily. - Continue midodrine 10 mg tid (will take 1523mf BP is low) - Auto-immune serologies negative (checked twice)  2. Chronic respiratory failure - [place] CT 5/23/19with "dependent basilar predominant patchy subpleural reticulation and ground-glass attenuation with the suggestion of minimal associated traction bronchiolectasis. No frank honeycombing. Findings may indicate an interstitial lung disease such as early usual interstitial pneumonia (UIP) or fibrotic phase nonspecific interstitial pneumonia (NSIP)." -Followed by Dr. ByrLamonte Sakai. Chronic AFL - Rate controlled.  Now off metoprolol. Continue coumadin with mechanical AVR. - No change today  4. CAD - s/p CABG 07/2017 with Dr [place]Roderic [place]- No s/s ischemia - continue ASA with CAD and AVR - Unable to tolerate atorva due to myalgias.  -ContinueCrestor 5mg59mily. Tolerating well  5. S/p mechanical AVR - stable on echo today  Continue coumadin/ASA 81. INR goal is 2.5-3 per Dr MunlBettina Gaviaaware of need for SBE prophylaxis  6. Leukocytoclastic vasculitis - f/u with Rheumatology. No change.  7. Cirrhosis - US [place] RUQ 05/16/18 - s/p cholecystectomy. + hepatic cirrhosis. Mild ascites.  - Liver Doppler 05/16/18 - No hepatic, splenic, or portal venous thrombosis or occlusion. Mild ascites. - Continue midodrine 10 mg am, afternoon, 15 mg HS - no change  8. Iron-def anemia - followed by PCP    DaniGlori Bickers  11:38 AM

## 2020-03-24 ENCOUNTER — Other Ambulatory Visit: Payer: Self-pay | Admitting: Cardiology

## 2020-03-25 ENCOUNTER — Telehealth (HOSPITAL_COMMUNITY): Payer: Self-pay | Admitting: Pharmacist

## 2020-03-25 NOTE — Telephone Encounter (Signed)
Sent in [facility] Patient Enrollment form to [employer]. Of note, prior authorization is already on file and good through 12/02/2021.  Audry Riles, PharmD, BCPS, BCCP, CPP Heart Failure Clinic Pharmacist [phone]

## 2020-04-06 ENCOUNTER — Other Ambulatory Visit: Payer: Self-pay

## 2020-04-06 ENCOUNTER — Ambulatory Visit (INDEPENDENT_AMBULATORY_CARE_PROVIDER_SITE_OTHER): Payer: Medicare Other | Admitting: Pharmacist

## 2020-04-06 DIAGNOSIS — I4892 Unspecified atrial flutter: Secondary | ICD-10-CM

## 2020-04-06 DIAGNOSIS — Z7901 Long term (current) use of anticoagulants: Secondary | ICD-10-CM | POA: Diagnosis not present

## 2020-04-06 DIAGNOSIS — Z5181 Encounter for therapeutic drug level monitoring: Secondary | ICD-10-CM

## 2020-04-06 LAB — POCT INR: INR: 3.6 — AB (ref 2.0–3.0)

## 2020-04-06 NOTE — Patient Instructions (Signed)
Hold warfarin today Tuesday April 13, then start taking 1/2 tablet daily except 1 tablet on Sunday, Wed and Friday.  Please call our office with any medication changes or concerns [phone]. Return in 2 weeks for INR check.

## 2020-04-09 NOTE — Telephone Encounter (Signed)
Patient has been successfully enrolled to received Uptravi. Will fill at [facility].   Audry Riles, PharmD, BCPS, BCCP, CPP Heart Failure Clinic Pharmacist [phone]

## 2020-04-27 ENCOUNTER — Other Ambulatory Visit: Payer: Self-pay

## 2020-04-27 ENCOUNTER — Ambulatory Visit (INDEPENDENT_AMBULATORY_CARE_PROVIDER_SITE_OTHER): Payer: Medicare Other | Admitting: Medical

## 2020-04-27 ENCOUNTER — Encounter: Payer: Self-pay | Admitting: Medical

## 2020-04-27 VITALS — BP 141/75 | HR 65 | Temp 97.7°F | Resp 18 | Ht 69.0 in | Wt 149.8 lb

## 2020-04-27 DIAGNOSIS — Z125 Encounter for screening for malignant neoplasm of prostate: Secondary | ICD-10-CM

## 2020-04-27 DIAGNOSIS — I2510 Atherosclerotic heart disease of native coronary artery without angina pectoris: Secondary | ICD-10-CM | POA: Diagnosis not present

## 2020-04-27 DIAGNOSIS — G629 Polyneuropathy, unspecified: Secondary | ICD-10-CM | POA: Diagnosis not present

## 2020-04-27 DIAGNOSIS — I4891 Unspecified atrial fibrillation: Secondary | ICD-10-CM

## 2020-04-27 DIAGNOSIS — L089 Local infection of the skin and subcutaneous tissue, unspecified: Secondary | ICD-10-CM

## 2020-04-27 DIAGNOSIS — S4991XA Unspecified injury of right shoulder and upper arm, initial encounter: Secondary | ICD-10-CM

## 2020-04-27 DIAGNOSIS — R350 Frequency of micturition: Secondary | ICD-10-CM | POA: Diagnosis not present

## 2020-04-27 LAB — COMPREHENSIVE METABOLIC PANEL
ALT: 15 U/L (ref 0–53)
AST: 35 U/L (ref 0–37)
Albumin: 4.3 g/dL (ref 3.5–5.2)
Alkaline Phosphatase: 52 U/L (ref 39–117)
BUN: 31 mg/dL — ABNORMAL HIGH (ref 6–23)
CO2: 29 mEq/L (ref 19–32)
Calcium: 9.6 mg/dL (ref 8.4–10.5)
Chloride: 96 mEq/L (ref 96–112)
Creatinine, Ser: 1.17 mg/dL (ref 0.40–1.50)
GFR: 61.09 mL/min (ref >60.00)
Glucose, Bld: 83 mg/dL (ref 70–99)
Potassium: 3.7 mEq/L (ref 3.5–5.1)
Sodium: 132 mEq/L — ABNORMAL LOW (ref 135–145)
Total Bilirubin: 1.5 mg/dL — ABNORMAL HIGH (ref 0.2–1.2)
Total Protein: 8 g/dL (ref 6.0–8.3)

## 2020-04-27 LAB — POCT INR: INR: 2.6 (ref 2.0–3.0)

## 2020-04-27 LAB — PSA: PSA: 0.11 ng/mL (ref 0.10–4.00)

## 2020-04-27 MED ORDER — CEPHALEXIN 500 MG PO CAPS: 500.0000 mg | ORAL_CAPSULE | Freq: Two times a day (BID) | ORAL | 0 refills | Status: DC
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
## 2020-04-27 NOTE — Patient Instructions (Addendum)
Possible skin infection left side upper back. Will rx doxycycline antibiotic. Skin sensitivity with burning and itching causes concern for possible shingles so will rx famvir.  For rt upper arm will refer to sports medicine MD. Dennis Bast may have partially torn proximal bicep tendon.  Will get inr today since you report upcoming dental procedure. Was in range today so will continue current regimen/no dose change.  Follow up in 2 weeks or as needed

## 2020-04-28 ENCOUNTER — Telehealth: Payer: Self-pay | Admitting: Cardiology

## 2020-04-28 NOTE — Telephone Encounter (Signed)
Patient states he is calling to inquire about whether or not holding warfarin (COUMADIN) 5 MG tabletmedication prior to tooth extraction is necessary. He states tooth extraction is scheduled for 05/04/20. Please advise.

## 2020-04-29 NOTE — Telephone Encounter (Signed)
Spoke to patient just now and let him know that Dr. Bettina Gavia said that he would not hold the coumadin prior to this patients tooth extraction. The patient verbalizes understanding and no other issues or concerns were noted.    Encouraged patient to call back with any questions or concerns.

## 2020-04-29 NOTE — Telephone Encounter (Signed)
With his mechanical aortic valve I would not hold warfarin.

## 2020-05-10 ENCOUNTER — Telehealth: Payer: Self-pay

## 2020-05-10 NOTE — Telephone Encounter (Signed)
Will you advise pt  to make appointment/virtual visit. Need to examine area and then refer to dermatologist.    If can see on video describe and then try to expedite the referral if needed.

## 2020-05-10 NOTE — Telephone Encounter (Signed)
Patient called in to see if PA [name] could send in a referral for a Dermatologist for a bite on the left shoulder blade. Please follow up with the patient at [phone] to advise.

## 2020-05-11 ENCOUNTER — Ambulatory Visit: Payer: Self-pay

## 2020-05-11 ENCOUNTER — Other Ambulatory Visit: Payer: Self-pay

## 2020-05-11 ENCOUNTER — Ambulatory Visit (INDEPENDENT_AMBULATORY_CARE_PROVIDER_SITE_OTHER): Payer: Medicare Other | Admitting: Family Medicine

## 2020-05-11 ENCOUNTER — Encounter: Payer: Self-pay | Admitting: Family Medicine

## 2020-05-11 VITALS — BP 133/77 | HR 60 | Ht 70.0 in | Wt 140.0 lb

## 2020-05-11 DIAGNOSIS — S46811A Strain of other muscles, fascia and tendons at shoulder and upper arm level, right arm, initial encounter: Secondary | ICD-10-CM

## 2020-05-11 DIAGNOSIS — M25511 Pain in right shoulder: Secondary | ICD-10-CM

## 2020-05-11 DIAGNOSIS — S46819A Strain of other muscles, fascia and tendons at shoulder and upper arm level, unspecified arm, initial encounter: Secondary | ICD-10-CM

## 2020-05-11 NOTE — Patient Instructions (Signed)
Nice to meet you Please try heat  Please try to massage the area  Please continue doing exercises   Please send me a message in MyChart with any questions or updates.  Please see me back in 4 weeks.   --Dr. Raeford Razor
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
## 2020-05-13 ENCOUNTER — Telehealth: Payer: Medicare Other | Admitting: Medical

## 2020-05-17 ENCOUNTER — Ambulatory Visit (INDEPENDENT_AMBULATORY_CARE_PROVIDER_SITE_OTHER): Payer: Medicare Other | Admitting: Cardiology

## 2020-05-17 ENCOUNTER — Other Ambulatory Visit: Payer: Self-pay

## 2020-05-17 ENCOUNTER — Encounter: Payer: Self-pay | Admitting: Cardiology

## 2020-05-17 VITALS — BP 152/70 | HR 74 | Ht 70.0 in | Wt 151.2 lb

## 2020-05-17 DIAGNOSIS — J449 Chronic obstructive pulmonary disease, unspecified: Secondary | ICD-10-CM | POA: Diagnosis not present

## 2020-05-17 DIAGNOSIS — I4892 Unspecified atrial flutter: Secondary | ICD-10-CM

## 2020-05-17 DIAGNOSIS — I2721 Secondary pulmonary arterial hypertension: Secondary | ICD-10-CM

## 2020-05-17 DIAGNOSIS — Z952 Presence of prosthetic heart valve: Secondary | ICD-10-CM | POA: Diagnosis not present

## 2020-05-17 DIAGNOSIS — I739 Peripheral vascular disease, unspecified: Secondary | ICD-10-CM | POA: Diagnosis not present

## 2020-05-17 DIAGNOSIS — E559 Vitamin D deficiency, unspecified: Secondary | ICD-10-CM | POA: Diagnosis not present

## 2020-05-17 DIAGNOSIS — Z951 Presence of aortocoronary bypass graft: Secondary | ICD-10-CM | POA: Diagnosis not present

## 2020-05-17 DIAGNOSIS — Z7901 Long term (current) use of anticoagulants: Secondary | ICD-10-CM

## 2020-05-17 NOTE — Progress Notes (Signed)
Cardiology Office Note:    Date:  05/17/2020   ID:  Lance Hicks, Lance Hicks 1946/03/18, MRN [id number]  PCP:  [name], PA-C  Cardiologist:  Jenne Campus, MD    Referring MD: Elise Benne   Chief Complaint  Patient presents with  . Follow-up    3 MO FU   Doing well but have problem with my legs  History of Present Illness:    Lance Hicks is a 74 y.o. male with quite incredible past medical history.  That include coronary artery disease, status post coronary artery bypass graft as well as aortic valve replacement with 25 mm [employer] valve done in 2010, at the same time he had Maze procedure as well as left atrial appendage excision.  He does have history of pulmonary hypertension, chronic kidney failure, permanent what appears to be atypical atrial flutter.  He comes today to my office for follow-up.  Overall after made with all his problems he is doing amazingly well.  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  He complains of having leg pain.  Pain does not happen with exercise or when he walks on the treadmill.  It happened after he rest.  He describes some uneasy sensation in his legs.  And some burning.  Rising suspicion for potentially having neuropathy.  Denies having any unusual shortness of breath, there is no chest pain tightness squeezing pressure burning chest no palpitations  Past Medical History:  Diagnosis Date  . Allergic rhinitis 04/28/2016   Last Assessment & Plan:  Continue astelin  . Anemia 07/01/2019  . 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 In light of concurrent left nephrolithiasis, will check CT renal colic next year instead of renal US.   [name] Anticoagulated on Coumadin 01/04/2018  . Anxiety 05/10/2016   Last Assessment & Plan:  Doing well off of zoloft.  . Asthma   . Asymptomatic microscopic hematuria 06/20/2017   Last Assessment & Plan:  Had hematuria  workup in [facility] in 2016 which negative CT and cystoscopy. UA with 2+ blood last visit - we discussed recommendation for repeat workup at 5 years or if degree of hematuria progresses.   . Atrial fibrillation ([place]) 03/29/2017  . Atrial flutter ([place]) 03/29/2017  . Backache 12/14/2015   Last Assessment & Plan:  Pain management referral for further evaluation.  . Bilateral pleural effusion 08/09/2017  . Chronic allergic rhinitis 04/28/2016   Last Assessment & Plan:  Continue astelin  . Chronic anticoagulation 03/29/2017  . Chronic atrial fibrillation ([place]) 07/23/2015   Last Assessment & Plan:  Coumadin and metoprolol, cardiology referral to establish care.  . Chronic midline back pain 12/14/2015   Last Assessment & Plan:  Pain management referral for further evaluation.  . Chronic obstructive lung disease ([place]) 06/12/2016   With hypoxia  . Chronic prostatitis 07/23/2015   Last Assessment & Plan:  Has largely resolved since stopping bike riding. Recommend annual DRE AND PSA - will see back 12/2015 for annual screening, given 1st degree fhx. To call office for recurrent prostatitis symptoms.   . Chronic respiratory failure with hypoxia ([place]) 07/29/2019  . 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 COPD (chronic obstructive pulmonary disease) ([place]) 06/12/2016  . Coronary arteriosclerosis in native artery 03/29/2017  . Coronary artery disease involving native coronary artery of native heart with angina pectoris ([place]) 03/29/2017  .  Cough 10/17/2016   Last Assessment & Plan:  Discussed typical course for acute viral illness. If symptoms worsen or fail to improve by 7-10d, delayed ATBs, fluids, rest, NSAIDs/APAP prn. Seek care if not improving. Needs earlier INR check due to ATBs.  [name] Dyspnea 02/01/2016   Last Assessment & Plan:  Overall improving, eval by pulm, plan for CT, neg stress test with cardiology. Recent switch to carvedilol due to side effects.  . Encounter for therapeutic drug monitoring 01/06/2019  .  Enterococcal bacteremia   . Epidermoid cyst of skin 08/24/2017  . Essential hypertension 12/14/2015   Last Assessment & Plan:  Hypertension control: controlled  Medications: compliant Medication Management: as noted in orders Home blood pressure monitoring recommended additionally as needed for symptoms  The patient's care plan was reviewed and updated. Instructions and counseling were provided regarding patient goals and barriers. He was counseled to adopt a healthy lifestyle. Educational resources and self-management tools have been provided as charted in [facility] list.   . H/O maze procedure 03/29/2017  . [name] 03/29/2017   Overview:  2011  . History of coronary artery bypass graft 03/29/2017  . Hx of CABG 03/29/2017  . Hyperlipidemia 03/29/2017  . Hypersensitivity angiitis ([place]) 10/01/2017  . Hypertensive heart disease 03/29/2017  . Hypertensive heart disease with heart failure ([place]) 03/29/2017  . Hypertensive heart failure ([place]) 03/29/2017  . Hypokalemia due to excessive renal loss of potassium 02/18/2018  . Hypotension, chronic 06/06/2018  . International normalized ratio (INR) raised 07/26/2017  . Kidney stone 07/23/2015  . Kidney stones 07/23/2015   Overview:  x 3  Last Assessment & Plan:  By [place] has left nephrolithiasis, but not visible by KUB. Will check CT renal colic next year to assess both stone burden as well as to surveil AML.   [name] Left ureteral stone 01/23/2018  . Leukocytoclastic vasculitis ([place]) 10/01/2017  . Localized edema 01/04/2018  . Long term (current) use of anticoagulants 03/29/2017  . Lumbar radicular pain 01/19/2016  . Lumbar radiculopathy 01/19/2016  . Maculopapular rash 09/03/2017  . Microscopic hematuria 06/20/2017   Last Assessment & Plan:  Had hematuria workup in [facility] in 2016 which negative CT and cystoscopy. UA with 2+ blood last visit - we discussed recommendation for repeat workup at 5 years or if degree of hematuria progresses.   . Multiple nodules of lung  06/12/2016  . Nasal discharge 02/25/2016   Last Assessment & Plan:  Trial zyrtec and flonase  . Nephrolithiasis 07/23/2015   Overview:  x 3  Last Assessment & Plan:  By [place] has left nephrolithiasis, but not visible by KUB. Will check CT renal colic next year to assess both stone burden as well as to surveil AML.   Overview:  x 3  Last Assessment & Plan:  Has 27m nonobstructing LUP stone - not visible by KUB.  Will check renal [place]2019 - he will contact office sooner if symptomatic.   . Non-sustained ventricular tachycardia ([place] 03/29/2017  . Nonsustained ventricular tachycardia ([place] 03/29/2017  . Other hyperlipidemia 03/29/2017  . Palpitations 10/01/2017  . Paroxysmal atrial fibrillation ([place] 03/29/2017  . Paroxysmal atrial fibrillation ([place] 03/29/2017  . Pleural effusion, bilateral 08/09/2017  . Post-nasal drainage 02/25/2016   Last Assessment & Plan:  Trial zyrtec and flonase  . Prostate cancer screening 06/20/2017   Last Assessment & Plan:  Recommend continued annual CaP screening until within 10 years of life expectancy. Given good health and fhx of longevity, would anticipate  CaP screening to continue until age 77.  PSA today and again in one year on day of visit.  . Pulmonary arterial hypertension ([place]) 02/20/2018  . Pulmonary edema   . Pulmonary hypertension ([place]) 08/09/2017  . Pulmonary nodules 06/12/2016  . S/P AVR 03/20/2016  . S/P AVR (aortic valve replacement) 03/20/2016  . Strain of deltoid muscle, initial encounter   . Supratherapeutic INR 07/26/2017  . Syncope 03/29/2017  . Syncope and collapse 02/01/2016  . Typical atrial flutter ([place]) 02/01/2016    Past Surgical History:  Procedure Laterality Date  . CHOLECYSTECTOMY    . CORONARY ARTERY BYPASS GRAFT    . EXPLORATORY LAPAROTOMY  07/30/2017  . FOOT SURGERY    . FRACTURE SURGERY Right    wrist and forearm  . HERNIA REPAIR    . MECHANICAL AORTIC VALVE REPLACEMENT    . NASAL SINUS SURGERY    . RIGHT HEART CATH N/A 01/18/2018   Procedure:  RIGHT HEART CATH;  Surgeon: Jolaine Artist, MD;  Location: [facility];  Service: Cardiovascular;  Laterality: N/A;  . RIGHT HEART CATH N/A 05/14/2018   Procedure: RIGHT HEART CATH;  Surgeon: Jolaine Artist, MD;  Location: [facility];  Service: Cardiovascular;  Laterality: N/A;  . TEE WITHOUT CARDIOVERSION N/A 05/21/2018   Procedure: TRANSESOPHAGEAL ECHOCARDIOGRAM (TEE);  Surgeon: Jolaine Artist, MD;  Location: [facility] ENDOSCOPY;  Service: Cardiovascular;  Laterality: N/A;  . UPPER GASTROINTESTINAL ENDOSCOPY  07/12/2017   Patchy areas of mucosal inflammation noted in the antrum with edema,erthema and ulcerations. Bx. Chronicfocally active gastritis.  [name] VASECTOMY      Current Medications: Current Meds  Medication Sig  . acetaminophen (TYLENOL) 650 MG CR tablet Take 1 tablet (650 mg total) by mouth every 8 (eight) hours as needed for pain. Not to use with norco since combined with tylenol.  [name] albuterol (VENTOLIN HFA) 108 (90 Base) MCG/ACT inhaler Inhale 2 puffs into the lungs every 4 (four) hours as needed for wheezing or shortness of breath.  [name] aspirin EC 81 MG tablet Take 81 mg by mouth daily.  . B Complex-C (B-COMPLEX WITH VITAMIN C) tablet Take 1 tablet by mouth daily.   . Cholecalciferol (VITAMIN D) 125 MCG (5000 UT) CAPS Take 1 capsule by mouth daily.  . macitentan (OPSUMIT) 10 MG tablet Take 1 tablet (10 mg total) by mouth daily.  . Magnesium Gluconate (MAGNESIUM 27 PO) Take 1 tablet by mouth daily.  . midodrine (PROAMATINE) 10 MG tablet Take 1 tablet (10 mg total) by mouth 3 (three) times daily with meals.  . rosuvastatin (CRESTOR) 5 MG tablet TAKE 1 TABLET(5 MG) BY MOUTH DAILY  . Selexipag (UPTRAVI) 200 MCG TABS Take 1 tablet (200 mcg total) by mouth in the morning and at bedtime. (Patient taking differently: Take 200 mcg by mouth 4 (four) times daily. )  . spironolactone (ALDACTONE) 25 MG tablet TAKE 1/2 OF A TABLET BY MOUTH ONCE DAILY.  . tadalafil, PAH,  (ADCIRCA) 20 MG tablet Take 2 tablets (40 mg total) by mouth daily.  [name] torsemide (DEMADEX) 20 MG tablet Take 20 mg by mouth as directed. Takes 2-3 tablets daily  . warfarin (COUMADIN) 5 MG tablet TAKE 1/2 TO 1 TABLET DAILY AS DIRECTED BY COUMADIN CLINIC     Allergies:   Protonix [pantoprazole sodium], Amoxicillin-pot clavulanate, and Tape   Social History   Socioeconomic History  . Marital status: Single    Spouse name: Not on file  . Number of children:  Not on file  . Years of education: Not on file  . Highest education level: Not on file  Occupational History  . Not on file  Tobacco Use  . Smoking status: Former Smoker    Packs/day: 2.00    Years: 34.00    Pack years: 68.00    Types: Cigarettes    Quit date: 07/16/2000    Years since quitting: 19.8  . Smokeless tobacco: Never Used  Substance and Sexual Activity  . Alcohol use: Not Currently  . Drug use: No  . Sexual activity: Not on file  Other Topics Concern  . Not on file  Social History Narrative  . Not on file   Social Determinants of Health   Financial Resource Strain:   . Difficulty of Paying Living Expenses:   Food Insecurity:   . Worried About Charity fundraiser in the Last Year:   . Arboriculturist in the Last Year:   Transportation Needs:   . Film/video editor (Medical):   [name] Lack of Transportation (Non-Medical):   Physical Activity:   . Days of Exercise per Week:   . Minutes of Exercise per Session:   Stress:   . Feeling of Stress :   Social Connections:   . Frequency of Communication with Friends and Family:   . Frequency of Social Gatherings with Friends and Family:   . Attends Religious Services:   . Active Member of Clubs or Organizations:   . Attends Archivist Meetings:   [name] Marital Status:      Family History: The patient's family history includes Arthritis in his mother; Asthma in his mother; Heart attack in his father; Hypertension in his father; Stroke in his paternal  grandmother. There is no history of Colon cancer. ROS:   Please see the history of present illness.    All 14 point review of systems negative except as described per history of present illness  EKGs/Labs/Other Studies Reviewed:      Recent Labs: 01/30/2020: Magnesium 2.3 03/17/2020: B Natriuretic Peptide 509.4; Hemoglobin 13.1; Platelets 212 04/27/2020: ALT 15; BUN 31; Creatinine, Ser 1.17; Potassium 3.7; Sodium 132  Recent Lipid Panel    Component Value Date/Time   CHOL 125 10/21/2018 1028   TRIG 57 10/21/2018 1028   HDL 43 10/21/2018 1028   CHOLHDL 2.9 10/21/2018 1028   VLDL 11 10/21/2018 1028   LDLCALC 71 10/21/2018 1028    Physical Exam:    VS:  BP (!) 152/70   Pulse 74   Ht _0  (1.778 m)   Wt 151 lb 3.2 oz (68.6 kg)   SpO2 96%   BMI 21.69 kg/m     Wt Readings from Last 3 Encounters:  05/17/20 151 lb 3.2 oz (68.6 kg)  05/11/20 140 lb (63.5 kg)  04/27/20 149 lb 12.8 oz (67.9 kg)     GEN:  Well nourished, well developed in no acute distress HEENT: Normal NECK: No JVD; No carotid bruits LYMPHATICS: No lymphadenopathy CARDIAC: RRR, no murmurs, no rubs, no gallops RESPIRATORY:  Clear to auscultation without rales, wheezing or rhonchi  ABDOMEN: Soft, non-tender, non-distended MUSCULOSKELETAL:  No edema; No deformity  SKIN: Warm and dry LOWER EXTREMITIES: no swelling NEUROLOGIC:  Alert and oriented x 3 PSYCHIATRIC:  Normal affect   ASSESSMENT:    1. Atrial flutter, unspecified type ([place])   2. Vitamin D deficiency   3. Claudication ([place])   4. Pulmonary arterial hypertension ([place])   5. Chronic obstructive pulmonary  disease, unspecified COPD type ([place])   6. History of coronary artery bypass graft   7. Anticoagulated on Coumadin   8. S/P AVR    PLAN:    In order of problems listed above:  1. Atrial flutter: He is anticoagulant with Coumadin with a therapeutic INR between 2.5 and 3.  We will continue. 2. Pulmonary hypertension: On appropriate medication  follow-up by pulmonary clinic. 3. COPD noted, stable. 4. Coronary disease status post coronary artery bypass grafting 2010 doing well from that point review continue present management. 5. Status post AVR, last echocardiogram reviewed normal parameters continue present management. 6. Leg pain very atypical.  I will ask him to have segmental pressures however I doubt very much with dealing with some vascular problem.  I can palpate fairly decently his distal pulses especially posterior tibial. 7. Anticoagulation with Coumadin followed by our clinic.   Medication Adjustments/Labs and Tests Ordered: Current medicines are reviewed at length with the patient today.  Concerns regarding medicines are outlined above.  Orders Placed This Encounter  Procedures  . Basic metabolic panel  . Magnesium  . Vitamin D 1,25 dihydroxy  . EKG 12-Lead  . VAS [place] LOWER EXT ART SEG MULTI (SEGMENTALS & LE RAYNAUDS)   Medication changes: No orders of the defined types were placed in this encounter.   Signed, Park Liter, MD, [facility] 05/17/2020 4:27 PM    [place]

## 2020-05-17 NOTE — Patient Instructions (Signed)
Medication Instructions:  Your physician recommends that you continue on your current medications as directed. Please refer to the Current Medication list given to you today.  *If you need a refill on your cardiac medications before your next appointment, please call your pharmacy*   Lab Work: Your physician recommends that you return for lab work today: bmp, mg, vitamin d 3    If you have labs (blood work) drawn today and your tests are completely normal, you will receive your results only by: [name] MyChart Message (if you have MyChart) OR . A paper copy in the mail If you have any lab test that is abnormal or we need to change your treatment, we will call you to review the results.   Testing/Procedures: Dr. Agustin Cree has advised you to have segmental pressures for both lower extremities.    Follow-Up: At [facility], you and your health needs are our priority.  As part of our continuing mission to provide you with exceptional heart care, we have created designated Provider Care Teams.  These Care Teams include your primary Cardiologist (physician) and Advanced Practice Providers (APPs -  Physician Assistants and Nurse Practitioners) who all work together to provide you with the care you need, when you need it.  We recommend signing up for the patient portal called "MyChart".  Sign up information is provided on this After Visit Summary.  MyChart is used to connect with patients for Virtual Visits (Telemedicine).  Patients are able to view lab/test results, encounter notes, upcoming appointments, etc.  Non-urgent messages can be sent to your provider as well.   To learn more about what you can do with MyChart, go to [email or web address].    Your next appointment:   4 month(s)  The format for your next appointment:   In Person  Provider:   Jenne Campus, MD   Other Instructions

## 2020-05-18 ENCOUNTER — Telehealth: Payer: Self-pay | Admitting: Medical

## 2020-05-18 ENCOUNTER — Telehealth (INDEPENDENT_AMBULATORY_CARE_PROVIDER_SITE_OTHER): Payer: Medicare Other | Admitting: Medical

## 2020-05-18 DIAGNOSIS — I2510 Atherosclerotic heart disease of native coronary artery without angina pectoris: Secondary | ICD-10-CM | POA: Diagnosis not present

## 2020-05-18 DIAGNOSIS — L819 Disorder of pigmentation, unspecified: Secondary | ICD-10-CM

## 2020-05-18 DIAGNOSIS — M792 Neuralgia and neuritis, unspecified: Secondary | ICD-10-CM

## 2020-05-18 MED ORDER — GABAPENTIN 100 MG PO CAPS: 100.0000 mg | ORAL_CAPSULE | Freq: Three times a day (TID) | ORAL | 0 refills | Status: DC

## 2020-05-18 NOTE — Patient Instructions (Signed)
Your skin condition persists despite covering for possible skin infection and shingle.  I did not see your skin due to shadow and briefly frozen screen on video. Will refer to dermatologist to evaluation the discoloration.   Will give gabapentin for neuropathic pain as pain may be post herpetic neuralgia.  Follow up as needed/particular if referal to derm delayed.

## 2020-05-18 NOTE — Telephone Encounter (Signed)
Patient states he was referred to a dermatologist in [place]. He lives in [place] but wanted a Paediatric nurse  in [place].Please correct. Thanks

## 2020-05-18 NOTE — Progress Notes (Signed)
   Subjective:    Patient ID: Lance Hicks, male    DOB: 1946/09/14, 74 y.o.   MRN: [id number]  HPI  Virtual Visit via Video Note  I connected with Lance Hicks on 05/18/20 at 10:40 AM EDT by a video enabled telemedicine application and verified that I am speaking with the correct person using two identifiers.  Location: Patient: home Provider: office   I discussed the limitations of evaluation and management by telemedicine and the availability of in person appointments. The patient expressed understanding and agreed to proceed.  History of Present Illness:  Pt has burning to his skin sensation to skin over past month. In area of left upper back. I prescribed doxycycline and famvir. Covered for skin infection and prescribed shingles.  Skin looks little discolored but not bright red, not warm or real tender. Slight burn     Observations/Objective:  General-no acute distress, pleasant, oriented. Lungs- on inspection lungs appear unlabored. Neck- no tracheal deviation or jvd on inspection. Neuro- gross motor function appears intact. Derm- pt tried to show me by video but severe shadow cast on skin. Could not see.  Assessment and Plan: Your skin condition persists despite covering for possible skin infection and shingle.  I did not see your skin due to shadow and briefly frozen screen on video. Will refer to dermatologist to evaluation the discoloration.   Will give gabapentin for neuropathic pain as pain may be post herpetic neuralgia.  Follow up as needed/particular if referal to derm delayed.  Time spent with patient today was 25  minutes which consisted of chart revdiew, discussing diagnoses, treatment, referral and documentation.  Follow Up Instructions:    I discussed the assessment and treatment plan with the patient. The patient was provided an opportunity to ask questions and all were answered. The patient agreed with the plan and demonstrated an  understanding of the instructions.   The patient was advised to call back or seek an in-person evaluation if the symptoms worsen or if the condition fails to improve as anticipated.     [name], PA-C   Review of Systems     Objective:   Physical Exam        Assessment & Plan:

## 2020-05-18 NOTE — Telephone Encounter (Signed)
I put in referral to derm in [place]. Would you send this note to person who actually made referral to correct.

## 2020-05-19 ENCOUNTER — Other Ambulatory Visit (HOSPITAL_COMMUNITY): Payer: Self-pay

## 2020-05-19 ENCOUNTER — Ambulatory Visit: Payer: Medicare Other | Admitting: Cardiology

## 2020-05-19 MED ORDER — UPTRAVI 400 MCG PO TABS: 400.0000 ug | ORAL_TABLET | Freq: Two times a day (BID) | ORAL | 4 refills | Status: DC

## 2020-05-19 NOTE — Telephone Encounter (Signed)
Received paper script will have it signed by MD and faxed to [facility]

## 2020-05-19 NOTE — Telephone Encounter (Signed)
Thanks for your help.

## 2020-05-19 NOTE — Telephone Encounter (Signed)
It looks like you accidentally entered [place] (in [place]) when you entered the referral. I have just faxed it to [facility] in [place]/Dr. Jimmye Norman. Thank you.

## 2020-05-21 LAB — BASIC METABOLIC PANEL
BUN/Creatinine Ratio: 26 — ABNORMAL HIGH (ref 10–24)
BUN: 39 mg/dL — ABNORMAL HIGH (ref 8–27)
CO2: 24 mmol/L (ref 20–29)
Calcium: 9.5 mg/dL (ref 8.6–10.2)
Chloride: 96 mmol/L (ref 96–106)
Creatinine, Ser: 1.52 mg/dL — ABNORMAL HIGH (ref 0.76–1.27)
GFR calc Af Amer: 52 mL/min/{1.73_m2} — ABNORMAL LOW (ref >59)
GFR calc non Af Amer: 45 mL/min/{1.73_m2} — ABNORMAL LOW (ref >59)
Glucose: 83 mg/dL (ref 65–99)
Potassium: 4 mmol/L (ref 3.5–5.2)
Sodium: 134 mmol/L (ref 134–144)

## 2020-05-21 LAB — VITAMIN D 1,25 DIHYDROXY
Vitamin D 1, 25 (OH)2 Total: 10 pg/mL — ABNORMAL LOW
Vitamin D2 1, 25 (OH)2: <10 pg/mL
Vitamin D3 1, 25 (OH)2: 10 pg/mL

## 2020-05-21 LAB — MAGNESIUM: Magnesium: 2.6 mg/dL — ABNORMAL HIGH (ref 1.6–2.3)

## 2020-05-25 ENCOUNTER — Other Ambulatory Visit: Payer: Self-pay

## 2020-05-25 ENCOUNTER — Ambulatory Visit (INDEPENDENT_AMBULATORY_CARE_PROVIDER_SITE_OTHER): Payer: Medicare Other | Admitting: *Deleted

## 2020-05-25 ENCOUNTER — Telehealth: Payer: Self-pay

## 2020-05-25 DIAGNOSIS — Z7901 Long term (current) use of anticoagulants: Secondary | ICD-10-CM | POA: Diagnosis not present

## 2020-05-25 DIAGNOSIS — Z5181 Encounter for therapeutic drug level monitoring: Secondary | ICD-10-CM | POA: Diagnosis not present

## 2020-05-25 DIAGNOSIS — I4892 Unspecified atrial flutter: Secondary | ICD-10-CM

## 2020-05-25 LAB — POCT INR: INR: 3.3 — AB (ref 2.0–3.0)

## 2020-05-25 NOTE — Telephone Encounter (Signed)
Patient called in to see if PA [name] or the nurse could give  him a call to discuss a prescription that he is interesting in taking which is Gabapentin. The patient would like a call back to advise at [phone]

## 2020-05-25 NOTE — Patient Instructions (Signed)
Hold warfarin today then decrease dose to 1/2 tablet daily except 1 tablet on Sunday and  Wednesdays.  Please call our office with any medication changes or concerns [phone]. Return in 3 weeks for INR check.

## 2020-05-28 ENCOUNTER — Encounter: Payer: Self-pay | Admitting: Medical

## 2020-05-28 ENCOUNTER — Telehealth (INDEPENDENT_AMBULATORY_CARE_PROVIDER_SITE_OTHER): Payer: Medicare Other | Admitting: Medical

## 2020-05-28 ENCOUNTER — Other Ambulatory Visit: Payer: Self-pay

## 2020-05-28 VITALS — BP 124/74 | HR 64

## 2020-05-28 DIAGNOSIS — M792 Neuralgia and neuritis, unspecified: Secondary | ICD-10-CM | POA: Diagnosis not present

## 2020-05-28 DIAGNOSIS — R7989 Other specified abnormal findings of blood chemistry: Secondary | ICD-10-CM

## 2020-05-28 DIAGNOSIS — I2510 Atherosclerotic heart disease of native coronary artery without angina pectoris: Secondary | ICD-10-CM

## 2020-05-28 DIAGNOSIS — G629 Polyneuropathy, unspecified: Secondary | ICD-10-CM

## 2020-05-28 MED ORDER — GABAPENTIN 100 MG PO CAPS: 100.0000 mg | ORAL_CAPSULE | Freq: Three times a day (TID) | ORAL | 3 refills | Status: DC

## 2020-05-28 MED ORDER — VITAMIN D (ERGOCALCIFEROL) 1.25 MG (50000 UNIT) PO CAPS: 50000.0000 [IU] | ORAL_CAPSULE | ORAL | 0 refills | Status: DC

## 2020-05-28 NOTE — Patient Instructions (Signed)
Based on prior visits and your description of very sensitive skin, I do think you have probable postherpetic neuralgia.  I did prescribe Famvir in the past.  Unfortunately I never got a good deal on previous video visit due to poor connection.  I am glad your appointment next week with dermatologist to get their opinion.  Recent prescription of Famvir today.  Rx advisement given on how to take.  Recent also prescription of potential lower extremity neuropathy.  Gabapentin can help with that.  For low vitamin D, did send in vitamin D 50,000 units to take 1 every 7 days for 7 weeks.  Then repeat vitamin D level in 8 weeks.  Follow-up in 2 months or as needed.

## 2020-05-28 NOTE — Progress Notes (Signed)
   Subjective:    Patient ID: Lance Hicks, male    DOB: 06/06/1946, 74 y.o.   MRN: [id number]  HPI  Virtual Visit via Telephone Note  I connected with Lance Hicks on 05/28/20 at 11:20 AM EDT by telephone and verified that I am speaking with the correct person using two identifiers.  Location: Patient: home Provider: office     I discussed the limitations, risks, security and privacy concerns of performing an evaluation and management service by telephone and the availability of in person appointments. I also discussed with the patient that there may be a patient responsible charge related to this service. The patient expressed understanding and agreed to proceed.   History of Present Illness:   Pt in follow up.  Pt states he is willing to get gabapentin. I had offered that to him gabapentin for nerve pain on last visit. I had concern for potential post herpetic neuralgia. His appointment with dermatologist is pending.  Prior to last visit I gave him doxyc and famvir.  He also has some recent lower ext neuropathy.  Pt declined gabapentin last visit. He states light touch causes severe pain. Also sun exposure causes pain.  Hx of low vit D. Recently test done by specialist.  Also magnesium stopped.   Observations/Objective:  General- no acute distress, pleasant, alert and oriented. Normal speech.  Assessment and Plan: Based on prior visits and your description of very sensitive skin, I do think you have probable postherpetic neuralgia.  I did prescribe Famvir in the past.  Unfortunately I never got a good deal on previous video visit due to poor connection.  I am glad your appointment next week with dermatologist to get their opinion.  Recent prescription of Famvir today.  Rx advisement given on how to take.  Recent also prescription of potential lower extremity neuropathy.  Gabapentin can help with that.  For low vitamin D, did send in vitamin D 50,000 units to  take 1 every 7 days for 7 weeks.  Then repeat vitamin D level in 8 weeks.  Follow-up in 2 months or as needed.  [name], PA-C   Time spent with patient today was 15  minutes which consisted of chart review, discussing diagnosis, referral, treatment and documentation.  Follow Up Instructions:    I discussed the assessment and treatment plan with the patient. The patient was provided an opportunity to ask questions and all were answered. The patient agreed with the plan and demonstrated an understanding of the instructions.   The patient was advised to call back or seek an in-person evaluation if the symptoms worsen or if the condition fails to improve as anticipated.     [name], PA-C    Review of Systems     Objective:   Physical Exam        Assessment & Plan:

## 2020-06-01 ENCOUNTER — Other Ambulatory Visit: Payer: Self-pay

## 2020-06-01 ENCOUNTER — Ambulatory Visit (INDEPENDENT_AMBULATORY_CARE_PROVIDER_SITE_OTHER): Payer: Medicare Other

## 2020-06-01 DIAGNOSIS — I739 Peripheral vascular disease, unspecified: Secondary | ICD-10-CM | POA: Diagnosis not present

## 2020-06-01 NOTE — Progress Notes (Unsigned)
Bilateral lower extremity arterial duplex exam performed.  Jimmy Hiroto Saltzman RDCS, RVT

## 2020-06-02 DIAGNOSIS — L309 Dermatitis, unspecified: Secondary | ICD-10-CM | POA: Diagnosis not present

## 2020-06-02 DIAGNOSIS — R233 Spontaneous ecchymoses: Secondary | ICD-10-CM | POA: Diagnosis not present

## 2020-06-16 NOTE — Progress Notes (Addendum)
I connected with [place] today by telephone and verified that I am speaking with the correct person using two identifiers. Location patient: home Location provider: work Persons participating in the virtual visit: patient, Marine scientist.    I discussed the limitations, risks, security and privacy concerns of performing an evaluation and management service by telephone and the availability of in person appointments. I also discussed with the patient that there may be a patient responsible charge related to this service. The patient expressed understanding and verbally consented to this telephonic visit.    Interactive audio and video telecommunications were attempted between this provider and patient, however failed, due to patient having technical difficulties OR patient did not have access to video capability.  We continued and completed visit with audio only.  Some vital signs may be absent or patient reported.    Subjective:   Lance Hicks is a 74 y.o. male who presents for Medicare Annual/Subsequent preventive examination.  Review of Systems     Cardiac Risk Factors include: advanced age (>26mn, >>6women);male gender     Objective:    Today's Vitals   06/17/20 0843  BP: 111/65  Pulse: 68  SpO2: 97%  Weight: 148 lb (67.1 kg)   Body mass index is 21.24 kg/m.  Advanced Directives 06/17/2020 02/25/2019 06/04/2018 05/15/2018 01/18/2018  Does Patient Have a Medical Advance Directive? _0   Would patient like information on creating a medical advance directive? No - Patient declined No - Patient declined No - Patient declined No - Patient declined No - Patient declined    Current Medications (verified) Outpatient Encounter Medications as of 06/17/2020  Medication Sig   acetaminophen (TYLENOL) 650 MG CR tablet Take 1 tablet (650 mg total) by mouth every 8 (eight) hours as needed for pain. Not to use with norco since combined with tylenol.   aspirin EC 81 MG tablet Take 81  mg by mouth daily.   B Complex-C (B-COMPLEX WITH VITAMIN C) tablet Take 1 tablet by mouth daily.    Cholecalciferol (VITAMIN D) 125 MCG (5000 UT) CAPS Take 1 capsule by mouth daily.   macitentan (OPSUMIT) 10 MG tablet Take 1 tablet (10 mg total) by mouth daily.   Magnesium Gluconate (MAGNESIUM 27 PO) Take 1 tablet by mouth every other day.    midodrine (PROAMATINE) 10 MG tablet Take 1 tablet (10 mg total) by mouth 3 (three) times daily with meals.   rosuvastatin (CRESTOR) 5 MG tablet TAKE 1 TABLET(5 MG) BY MOUTH DAILY   Selexipag (UPTRAVI) 400 MCG TABS Take 400 mcg by mouth 2 (two) times daily.   spironolactone (ALDACTONE) 25 MG tablet TAKE 1/2 OF A TABLET BY MOUTH ONCE DAILY.   tadalafil, PAH, (ADCIRCA) 20 MG tablet Take 2 tablets (40 mg total) by mouth daily.   torsemide (DEMADEX) 20 MG tablet Take 20 mg by mouth as directed. Takes 2-3 tablets daily   Vitamin D, Ergocalciferol, (DRISDOL) 1.25 MG (50000 UNIT) CAPS capsule Take 1 capsule (50,000 Units total) by mouth every 7 (seven) days.   warfarin (COUMADIN) 5 MG tablet TAKE 1/2 TO 1 TABLET DAILY AS DIRECTED BY COUMADIN CLINIC   albuterol (VENTOLIN HFA) 108 (90 Base) MCG/ACT inhaler Inhale 2 puffs into the lungs every 4 (four) hours as needed for wheezing or shortness of breath. (Patient not taking: Reported on 06/17/2020)   gabapentin (NEURONTIN) 100 MG capsule Take 1 capsule (100 mg total) by mouth 3 (three) times daily. (Patient not taking: Reported on 06/17/2020)  metolazone (ZAROXOLYN) 2.5 MG tablet Take 1 tablet (2.5 mg total) by mouth daily as needed (for weight gain).   omeprazole (PRILOSEC) 20 MG capsule Take 20 mg by mouth daily.  (Patient not taking: Reported on 06/17/2020)   Potassium Chloride ER 20 MEQ TBCR Take 40 mEq by mouth daily. Take an extra 2 tabs when you take Metolazone   venlafaxine XR (EFFEXOR-XR) 37.5 MG 24 hr capsule TAKE 1 CAPSULE(37.5 MG) BY MOUTH DAILY WITH BREAKFAST (Patient not taking: Reported on 05/17/2020)    [DISCONTINUED] cephALEXin (KEFLEX) 500 MG capsule Take 1 capsule (500 mg total) by mouth 2 (two) times daily. (Patient not taking: Reported on 05/17/2020)   [DISCONTINUED] gabapentin (NEURONTIN) 100 MG capsule Take 1 capsule (100 mg total) by mouth 3 (three) times daily.   No facility-administered encounter medications on file as of 06/17/2020.    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 In light of concurrent left nephrolithiasis, will check CT renal colic next year instead of renal US.    Anticoagulated on Coumadin 01/04/2018   Anxiety 05/10/2016   Last Assessment & Plan:  Doing well off of zoloft.   Asthma    Asymptomatic microscopic hematuria 06/20/2017   Last Assessment & Plan:  Had hematuria workup in [facility] in 2016 which negative CT and cystoscopy. UA with 2+ blood last visit - we discussed recommendation for repeat workup at 5 years or if degree of hematuria progresses.    Atrial fibrillation ([place]) 03/29/2017   Atrial flutter ([place]) 03/29/2017   Backache 12/14/2015   Last Assessment & Plan:  Pain management referral for further evaluation.   Bilateral pleural effusion 08/09/2017   Chronic allergic rhinitis 04/28/2016   Last Assessment & Plan:  Continue astelin   Chronic anticoagulation 03/29/2017   Chronic atrial fibrillation ([place]) 07/23/2015   Last Assessment & Plan:  Coumadin and metoprolol, cardiology referral to establish care.   Chronic midline back pain 12/14/2015   Last Assessment & Plan:  Pain management referral for further evaluation.   Chronic obstructive lung disease ([place]) 06/12/2016   With hypoxia   Chronic prostatitis 07/23/2015   Last Assessment & Plan:  Has largely resolved since stopping  bike riding. Recommend annual DRE AND PSA - will see back 12/2015 for annual screening, given 1st degree fhx. To call office for recurrent prostatitis symptoms.    Chronic respiratory failure with hypoxia ([place]) 07/29/2019   Cirrhosis of liver not due to alcohol ([place]) 07/01/2019   COPD (chronic obstructive pulmonary disease) ([place]) 06/12/2016   Coronary arteriosclerosis in native artery 03/29/2017   Coronary artery disease involving native coronary artery of native heart with angina pectoris ([place]) 03/29/2017   Cough 10/17/2016   Last Assessment & Plan:  Discussed typical course for acute viral illness. If symptoms worsen or fail to improve by 7-10d, delayed ATBs, fluids, rest, NSAIDs/APAP prn. Seek care if not improving. Needs earlier INR check due to ATBs.   Dyspnea 02/01/2016   Last Assessment & Plan:  Overall improving, eval by pulm, plan for CT, neg stress test with cardiology. Recent switch to carvedilol due to side effects.   Encounter for therapeutic drug monitoring 01/06/2019   Enterococcal bacteremia    Epidermoid cyst of skin  08/24/2017   Essential hypertension 12/14/2015   Last Assessment & Plan:  Hypertension control: controlled  Medications: compliant Medication Management: as noted in orders Home blood pressure monitoring recommended additionally as needed for symptoms  The patient's care plan was reviewed and updated. Instructions and counseling were provided regarding patient goals and barriers. He was counseled to adopt a healthy lifestyle. Educational resources and self-management tools have been provided as charted in [facility] list.    H/O maze procedure 03/29/2017   [name] 03/29/2017   Overview:  2011   History of coronary artery bypass graft 03/29/2017   Hx of CABG 03/29/2017   Hyperlipidemia 03/29/2017   Hypersensitivity angiitis ([place]) 10/01/2017   Hypertensive heart disease 03/29/2017   Hypertensive heart disease with heart failure ([place]) 03/29/2017   Hypertensive heart  failure ([place]) 03/29/2017   Hypokalemia due to excessive renal loss of potassium 02/18/2018   Hypotension, chronic 06/06/2018   International normalized ratio (INR) raised 07/26/2017   Kidney stone 07/23/2015   Kidney stones 07/23/2015   Overview:  x 3  Last Assessment & Plan:  By [place] has left nephrolithiasis, but not visible by KUB. Will check CT renal colic next year to assess both stone burden as well as to surveil AML.    Left ureteral stone 01/23/2018   Leukocytoclastic vasculitis ([place]) 10/01/2017   Localized edema 01/04/2018   Long term (current) use of anticoagulants 03/29/2017   Lumbar radicular pain 01/19/2016   Lumbar radiculopathy 01/19/2016   Maculopapular rash 09/03/2017   Microscopic hematuria 06/20/2017   Last Assessment & Plan:  Had hematuria workup in [facility] in 2016 which negative CT and cystoscopy. UA with 2+ blood last visit - we discussed recommendation for repeat workup at 5 years or if degree of hematuria progresses.    Multiple nodules of lung 06/12/2016   Nasal discharge 02/25/2016   Last Assessment & Plan:  Trial zyrtec and flonase   Nephrolithiasis 07/23/2015   Overview:  x 3  Last Assessment & Plan:  By [place] has left nephrolithiasis, but not visible by KUB. Will check CT renal colic next year to assess both stone burden as well as to surveil AML.   Overview:  x 3  Last Assessment & Plan:  Has 61m nonobstructing LUP stone - not visible by KUB.  Will check renal [place]2019 - he will contact office sooner if symptomatic.    Non-sustained ventricular tachycardia ([place] 03/29/2017   Nonsustained ventricular tachycardia ([place] 03/29/2017   Other hyperlipidemia 03/29/2017   Palpitations 10/01/2017   Paroxysmal atrial fibrillation ([facility]) 03/29/2017   Paroxysmal atrial fibrillation ([place] 03/29/2017   Pleural effusion, bilateral 08/09/2017   Post-nasal drainage 02/25/2016   Last Assessment & Plan:  Trial zyrtec and flonase   Prostate cancer screening 06/20/2017   Last Assessment & Plan:  Recommend continued annual  CaP screening until within 10 years of life expectancy. Given good health and fhx of longevity, would anticipate CaP screening to continue until age 74  PSA today and again in one year on day of visit.   Pulmonary arterial hypertension ([place] 02/20/2018   Pulmonary edema    Pulmonary hypertension ([place] 08/09/2017   Pulmonary nodules 06/12/2016   S/P AVR 03/20/2016   S/P AVR (aortic valve replacement) 03/20/2016   Strain of deltoid muscle, initial encounter    Supratherapeutic INR 07/26/2017   Syncope 03/29/2017   Syncope and collapse 02/01/2016   Typical atrial flutter ([place] 02/01/2016   Past Surgical History:  Procedure Laterality  Date   CHOLECYSTECTOMY     CORONARY ARTERY BYPASS GRAFT     EXPLORATORY LAPAROTOMY  07/30/2017   FOOT SURGERY     FRACTURE SURGERY Right    wrist and forearm   HERNIA REPAIR     MECHANICAL AORTIC VALVE REPLACEMENT     NASAL SINUS SURGERY     RIGHT HEART CATH N/A 01/18/2018   Procedure: RIGHT HEART CATH;  Surgeon: Jolaine Artist, MD;  Location: [facility];  Service: Cardiovascular;  Laterality: N/A;   RIGHT HEART CATH N/A 05/14/2018   Procedure: RIGHT HEART CATH;  Surgeon: Jolaine Artist, MD;  Location: [facility];  Service: Cardiovascular;  Laterality: N/A;   TEE WITHOUT CARDIOVERSION N/A 05/21/2018   Procedure: TRANSESOPHAGEAL ECHOCARDIOGRAM (TEE);  Surgeon: Jolaine Artist, MD;  Location: [facility] ENDOSCOPY;  Service: Cardiovascular;  Laterality: N/A;   UPPER GASTROINTESTINAL ENDOSCOPY  07/12/2017   Patchy areas of mucosal inflammation noted in the antrum with edema,erthema and ulcerations. Bx. Chronicfocally active gastritis.   VASECTOMY     Family History  Problem Relation Age of Onset   Asthma Mother    Arthritis Mother    Heart attack Father    Hypertension Father    Stroke Paternal Grandmother    Colon cancer Neg Hx    Social History   Socioeconomic History   Marital status: Single    Spouse name: Not on file   Number of  children: Not on file   Years of education: Not on file   Highest education level: Not on file  Occupational History   Not on file  Tobacco Use   Smoking status: Former Smoker    Packs/day: 2.00    Years: 34.00    Pack years: 68.00    Types: Cigarettes    Quit date: 07/16/2000    Years since quitting: 19.9   Smokeless tobacco: Never Used  Vaping Use   Vaping Use: Never used  Substance and Sexual Activity   Alcohol use: Not Currently   Drug use: No   Sexual activity: Not on file  Other Topics Concern   Not on file  Social History Narrative   Not on file   Social Determinants of Health   Financial Resource Strain: Low Risk    Difficulty of Paying Living Expenses: Not hard at all  Food Insecurity: No Food Insecurity   Worried About Charity fundraiser in the Last Year: Never true   [place] in the Last Year: Never true  Transportation Needs: No Transportation Needs   Lack of Transportation (Medical): No   Lack of Transportation (Non-Medical): No  Physical Activity:    Days of Exercise per Week:    Minutes of Exercise per Session:   Stress:    Feeling of Stress :   Social Connections:    Frequency of Communication with Friends and Family:    Frequency of Social Gatherings with Friends and Family:    Attends Religious Services:    Active Member of Clubs or Organizations:    Attends Archivist Meetings:    Marital Status:     Tobacco Counseling Counseling given: Not Answered   Clinical Intake: Pain : No/denies pain    Activities of Daily Living In your present state of health, do you have any difficulty performing the following activities: 06/17/2020  Hearing? N  Vision? N  Difficulty concentrating or making decisions? N  Walking or climbing stairs? N  Dressing or bathing?  N  Doing errands, shopping? N  Preparing Food and eating ? N  Using the Toilet? N  In the past six months, have you accidently leaked urine? N  Do you have problems with  loss of bowel control? N  Managing your Medications? N  Managing your Finances? N  Housekeeping or managing your Housekeeping? N  Some recent data might be hidden    Patient Care Team: Saguier, Iris Pert as PCP - General (Internal Medicine) Richardo Priest, MD as Consulting Physician (Cardiology)  Indicate any recent Medical Services you may have received from other than [facility] providers in the past year (date may be approximate).     Assessment:   This is a routine wellness examination for Lance Hicks.  Dietary issues and exercise activities discussed: Current Exercise Habits: Home exercise routine, Type of exercise: treadmill;strength training/weights;calisthenics, Time (Minutes): 60, Frequency (Times/Week): 6, Weekly Exercise (Minutes/Week): 360, Intensity: Mild, Exercise limited by: None identified   Diet (meal preparation, eat out, water intake, caffeinated beverages, dairy products, fruits and vegetables): well balanced   Goals      Maintain healthy active lifestyle.       Depression Screen PHQ 2/9 Scores 06/17/2020  PHQ - 2 Score 0    Fall Risk Fall Risk  06/17/2020  Falls in the past year? 0  Number falls in past yr: 0  Injury with Fall? 0  Follow up Education provided;Falls prevention discussed    Any stairs in or around the home? No  If so, are there any without handrails? No  Home free of loose throw rugs in walkways, pet beds, electrical cords, etc? Yes  Adequate lighting in your home to reduce risk of falls? Yes   ASSISTIVE DEVICES UTILIZED TO PREVENT FALLS:  Life alert? No  Use of a cane, walker or w/c? No  Grab bars in the bathroom? No  Shower chair or bench in shower? No  Elevated toilet seat or a handicapped toilet? No     Cognitive Function: Ad8 score reviewed for issues: Issues making decisions:no Less interest in hobbies / activities:no Repeats questions, stories (family complaining):no Trouble using ordinary gadgets (microwave, computer,  phone):no Forgets the month or year: no Mismanaging finances: no Remembering appts:no Daily problems with thinking and/or memory:no Ad8 score is=0         Immunizations Immunization History  Administered Date(s) Administered   Influenza, High Dose Seasonal PF 09/05/2018   Influenza,inj,Quad PF,6+ Mos 10/13/2015, 09/19/2016, 10/09/2019   Moderna SARS-COVID-2 Vaccination 02/19/2020, 03/22/2020   Pneumococcal Conjugate-13 11/28/2017   Pneumococcal Polysaccharide-23 02/03/2016, 12/06/2018   Tdap 08/13/2019    TDAP status: Up to date Flu Vaccine status: Up to date Pneumococcal vaccine status: Up to date Covid-19 vaccine status: Completed vaccines   Screening Tests Health Maintenance  Topic Date Due   Hepatitis C Screening  Never done   COLONOSCOPY  Never done   INFLUENZA VACCINE  07/25/2020   TETANUS/TDAP  08/12/2029   COVID-19 Vaccine  Completed   PNA vac Low Risk Adult  Completed    Health Maintenance  Health Maintenance Due  Topic Date Due   Hepatitis C Screening  Never done   COLONOSCOPY  Never done    Pt states he follows with Dr.Gupta for colon health.  Additional Screening:  Vision Screening: Recommended annual ophthalmology exams for early detection of glaucoma and other disorders of the eye. Is the patient up to date with their annual eye exam?  No . Pt has appt 07/28/20 with [facility].  Dental Screening: Recommended annual dental exams for proper oral hygiene  Community Resource Referral / Chronic Care Management: CRR required this visit?  No   CCM required this visit?  No      Plan:    Please schedule your next medicare wellness visit with me in 1 yr.  Continue to eat heart healthy diet (full of fruits, vegetables, whole grains, lean protein, water--limit salt, fat, and sugar intake) and increase physical activity as tolerated.  Continue doing brain stimulating activities (puzzles, reading, adult coloring books, staying active) to keep  memory sharp.    I have personally reviewed and noted the following in the patient's chart:   Medical and social history Use of alcohol, tobacco or illicit drugs  Current medications and supplements Functional ability and status Nutritional status Physical activity Advanced directives List of other physicians Hospitalizations, surgeries, and ER visits in previous 12 months Vitals Screenings to include cognitive, depression, and falls Referrals and appointments  In addition, I have reviewed and discussed with patient certain preventive protocols, quality metrics, and best practice recommendations. A written personalized care plan for preventive services as well as general preventive health recommendations were provided to patient.   Due to this being a telephonic visit, the after visit summary with patients personalized plan was offered to patient via mail or my-chart. Patient would like to access on my-chart.  Shela Nevin, [place]   06/17/2020   Nurse Notes: Pt would like PCP to know that he was not pleased with his experience at [facility] dermatology. Reports it was very dirty and recommends other patients not be sent there.   Reviewed and Agree with Assessment & plan of RN.  [name], PA-C

## 2020-06-17 ENCOUNTER — Ambulatory Visit (INDEPENDENT_AMBULATORY_CARE_PROVIDER_SITE_OTHER): Payer: Medicare Other | Admitting: *Deleted

## 2020-06-17 ENCOUNTER — Other Ambulatory Visit: Payer: Self-pay

## 2020-06-17 ENCOUNTER — Encounter: Payer: Self-pay | Admitting: *Deleted

## 2020-06-17 VITALS — BP 111/65 | HR 68 | Wt 148.0 lb

## 2020-06-17 DIAGNOSIS — Z0000 Encounter for general adult medical examination without abnormal findings: Secondary | ICD-10-CM

## 2020-06-17 NOTE — Patient Instructions (Signed)
Mr. Rockhill , Thank you for taking time to come for your Medicare Wellness Visit. I appreciate your ongoing commitment to your health goals. Please review the following plan we discussed and let me know if I can assist you in the future.   Screening recommendations/referrals: Colonoscopy: Continue to follow w/ Dr.Gupta. Recommended yearly ophthalmology/optometry visit for glaucoma screening and checkup Recommended yearly dental visit for hygiene and checkup  Vaccinations: TDAP status: Up to date Flu Vaccine status: Up to date Pneumococcal vaccine status: Up to date Covid-19 vaccine status: Completed vaccines  Advanced directives: Bring a copy of your living will and/or healthcare power of attorney to your next office visit.   Next appointment: Follow up in one year for your annual wellness visit    Preventive Care 65 Years and Older, Male Preventive care refers to lifestyle choices and visits with your health care provider that can promote health and wellness. This includes:  A yearly physical exam. This is also called an annual well check.  Regular dental and eye exams.  Immunizations.  Screening for certain conditions.  Healthy lifestyle choices, such as diet and exercise. What can I expect for my preventive care visit? Physical exam Your health care provider will check:  Height and weight. These may be used to calculate body mass index (BMI), which is a measurement that tells if you are at a healthy weight.  Heart rate and blood pressure.  Your skin for abnormal spots. Counseling Your health care provider may ask you questions about:  Alcohol, tobacco, and drug use.  Emotional well-being.  Home and relationship well-being.  Sexual activity.  Eating habits.  History of falls.  Memory and ability to understand (cognition).  Work and work Statistician. What immunizations do I need?  Influenza (flu) vaccine  This is recommended every year. Tetanus,  diphtheria, and pertussis (Tdap) vaccine  You may need a Td booster every 10 years. Varicella (chickenpox) vaccine  You may need this vaccine if you have not already been vaccinated. Zoster (shingles) vaccine  You may need this after age 67. Pneumococcal conjugate (PCV13) vaccine  One dose is recommended after age 70. Pneumococcal polysaccharide (PPSV23) vaccine  One dose is recommended after age 72. Measles, mumps, and rubella (MMR) vaccine  You may need at least one dose of MMR if you were born in 1957 or later. You may also need a second dose. Meningococcal conjugate (MenACWY) vaccine  You may need this if you have certain conditions. Hepatitis A vaccine  You may need this if you have certain conditions or if you travel or work in places where you may be exposed to hepatitis A. Hepatitis B vaccine  You may need this if you have certain conditions or if you travel or work in places where you may be exposed to hepatitis B. Haemophilus influenzae type b (Hib) vaccine  You may need this if you have certain conditions. You may receive vaccines as individual doses or as more than one vaccine together in one shot (combination vaccines). Talk with your health care provider about the risks and benefits of combination vaccines. What tests do I need? Blood tests  Lipid and cholesterol levels. These may be checked every 5 years, or more frequently depending on your overall health.  Hepatitis C test.  Hepatitis B test. Screening  Lung cancer screening. You may have this screening every year starting at age 55 if you have a 30-pack-year history of smoking and currently smoke or have quit within the past 15  years.  Colorectal cancer screening. All adults should have this screening starting at age 61 and continuing until age 88. Your health care provider may recommend screening at age 53 if you are at increased risk. You will have tests every 1-10 years, depending on your results and  the type of screening test.  Prostate cancer screening. Recommendations will vary depending on your family history and other risks.  Diabetes screening. This is done by checking your blood sugar (glucose) after you have not eaten for a while (fasting). You may have this done every 1-3 years.  Abdominal aortic aneurysm (AAA) screening. You may need this if you are a current or former smoker.  Sexually transmitted disease (STD) testing. Follow these instructions at home: Eating and drinking  Eat a diet that includes fresh fruits and vegetables, whole grains, lean protein, and low-fat dairy products. Limit your intake of foods with high amounts of sugar, saturated fats, and salt.  Take vitamin and mineral supplements as recommended by your health care provider.  Do not drink alcohol if your health care provider tells you not to drink.  If you drink alcohol: ? Limit how much you have to 0-2 drinks a day. ? Be aware of how much alcohol is in your drink. In the [place] one drink equals one 12 oz bottle of beer (355 mL), one 5 oz glass of wine (148 mL), or one 1 oz glass of hard liquor (44 mL). Lifestyle  Take daily care of your teeth and gums.  Stay active. Exercise for at least 30 minutes on 5 or more days each week.  Do not use any products that contain nicotine or tobacco, such as cigarettes, e-cigarettes, and chewing tobacco. If you need help quitting, ask your health care provider.  If you are sexually active, practice safe sex. Use a condom or other form of protection to prevent STIs (sexually transmitted infections).  Talk with your health care provider about taking a low-dose aspirin or statin. What's next?  Visit your health care provider once a year for a well check visit.  Ask your health care provider how often you should have your eyes and teeth checked.  Stay up to date on all vaccines. This information is not intended to replace advice given to you by your health care  provider. Make sure you discuss any questions you have with your health care provider. Document Revised: 12/05/2018 Document Reviewed: 12/05/2018 Elsevier Patient Education  2020 [employer].

## 2020-06-20 NOTE — Progress Notes (Addendum)
ADVANCED HF CLINIC NOTE   Date:  06/21/2020   ID:  [name], [name] Sep 14, 1946, MRN [id number]  PCP:  [name], PA-C  Cardiologist:  Dr. 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 Also has COPD (quit smoking in 2002 - 1 ppd x 25 years), HL, and HTN.  In 8/18 admitted for acute pancreatitis. Underwent lap cholecystectomy on 07/29/2017. On 8/6 hedeveloped hypotension and was taken back for open laparotomy due to bleeding from an unclear omental source.He was given fluids and 8 units PRBC perioperatively. He says his breathing has been bad since that time.   Seen at [place] in 2018 had hi-res CT, PFTs and VQ (low prob). Felt to have Golds I COPD with asthma overlap and early pulmonary fibrosis with PAH. Thought felt pulmonary pressures may have been elevated due to recent illness. Referred to Rheumatology though appt not until May. Suggested possible RHC.   He was also seen by Cardiology. Echocardiogram 8/18 as below showed normal LV function with septal flattening and RVSP 65-72mHG. There was no comment on RV size or function. 14-day event monitor showed 10-beat run NSVT. Felt to have chronic AF and PAH. No further testing ordered.  Underwent RHC on 01/18/18 with severe PAH and cor pulmonale. Macitentan and Adcirca.   Admitted 5/21-5/30/19 with symptomatic hypotension and weight gain. He had a repeat RHC and echo with bubble study (results below). He was started on midodrine for BP support. Diuresed 25 lbs with IV lasix, then transitioned to torsemide 60 mg daily. Abdominal [place]ore[place]owed cirrhosis with no evidence of portal HTN. Pulm consulted for possible ILD on High-res chest CT. Ortho consulted for right rotator cuff tear from horse  accident PTA. He was treated with antibiotics for Bacteremia. DC weight: 140 lbs.   He presents today for regular follow up. Remains on Adcirca and macitentan. He was unable to tolerate Uptravi due to diarrhea at higher doses. At last visit we rechallenged with lower dose. Now on 800/600. Unable tolerate due to severe cramping all over his body. Was able to tolerate 600/400. Going to gym for 2 hours 6 days/week. 312m on TM 25 min on bike. Also doing the stepper. Breathing OK. Takes metolazone about once a week.   Echo 3/21 EF 45-50% AVR ok. Septal flattening RV dilated mild HK. Moderate TR RVSP 80 severe bilateral enlargement. Personally reviewed   Review of systems complete and found to be negative unless listed in HPI.   Studies:  RHC 5/21/19showed Mild/Moderate PAH in setting of high output with no evidence of intracardiac shunting. Hemodynamics as below.   High-Res Chest CT 05/16/18 1. Slightly irregular 0.9 cm peripheral right middle lobe solid pulmonary nodule. 3 month follow up recommended.  2. Dependent basilar predominant patchy subpleural reticulation and ground-glass attenuation with the suggestion of minimal associated traction bronchiolectasis. No frank honeycombing. Findings may indicate an interstitial lung disease such as early usual interstitial pneumonia (UIP) or fibrotic phase nonspecific interstitial pneumonia (NSIP). Suggest a follow-up high-resolution chest CT study in 6-12 months to assess temporal pattern stability, as clinically warranted. 3. Mild cardiomegaly. Minimal interlobular septal thickening and trace dependent bilateral pleural effusions.  4. Small to moderate volume perihepatic ascites. 5. Ectatic  4.3 cm ascending thoracic aorta. Recommend annual imaging followup by CTA or MRA.  6. Left main and 3 vessel coronary atherosclerosis.  US Abdomen RUQ 05/16/18 - s/p cholecystectomy. + hepatic cirrhosis. Mild ascites.   Liver Doppler 05/16/18 - No  hepatic, splenic, or portal venous thrombosis or occlusion. Mild ascites.  Echo with bubble study 05/16/18 LVEF 55-60% with "late bubbles" , Mechanical AVR stable with trivial perivalvular regurg, Mild MR, Severe LAE, Severe RV dilation and reduced function. No PFO. Mod TR, PA peak pressure 68 mm Hg  [place]wan numbers5/24/19: PA 60-19 (32) CVP ~10 CO 5.4 CI 2.9  RHC 05/14/18 RA = 18 RV = 71/17 PA = 69/24 (39) PCW = 20 Fick cardiac output/index = 7.0/3.7 Thermo CO/CI = 7.2/3.8 PVR = 2.6 WU Ao sat = 97% PA sat = 73%, 73% SVC sat = 73%  RA sat = 72%  RHC 01/18/18 RA = 23 RV = 84/21 PA = 81/38 (53) PCW = 22 Fick cardiac output/index = 3.0/1.6 PVR = 10.4 WU FA sat = 98% on 2L  (checked on RA = 93%) PA sat = 60%, 61% SVC sat = 62%   Echo 8/18  1. LVEF is estimated to be 55%. 2. There is septal flattening consistent with right ventricular pressure and or volume overload. 3. A mechanical prosthetic aortic valve is present with mild AI 4. There is moderate to severe tricuspid regurgitation. 5. There is a trivial amount of pulmonic regurgitation. 6. The right ventricular systolic pressure is calculated at 65-64mHg. 7. The inferior vena cava is dilated with <50% inspiratory collapse, suggestive of an elevated right atrial pressure (152mg).  14 Day Patch 10/01/2017-10/15/2017:  -1 run of Ventricular Tachycardia occurred lasting 10 beats with a max rate of 240 bpm (avg 161 bpm). -Atrial Flutter occurred continuously (100% burden), ranging from 55-132 bpm (avg of 87 bpm).   NM Ventilation and Perfusion Lung Scan 10/31/2017:  Low probability of pulmonary embolism, using the PIOPED criteria.  Lower extremity venous duplex 07/25/2017:  1. Low probability of acute deep vein thrombosis in bilateral lower extremities. 2. Calf veins are poorly visualized but appear patent in limited segments visualized.   PFTs 07/02/2017:  FVC86% FEV185% FEV1/FVC 73% DLCO8.2,  32%   6MW -1400 feet; 97% to 85% (recovered with resting)   CT Chest07/08/2017: 1. Stable right middle lobe pulmonary nodule measuring up to 1 cm. 2. Minimal subpleural reticulation without evidence of honeycombing to  suggest pulmonary fibrosis. Stable centrilobular emphysema. 3. Mediastinal lymphadenopathy, increased from prior. 4. Calcified right hilar lymph nodes and multiple calcified granulomas  throughout the right lung consistent with prior granulomatous disease. 5. Ascending thoracic aortic aneurysm measuring up to 4.5 cm, unchanged. 6. Dilatation of the main pulmonary artery suggestive of pulmonary  hypertension. 7. Cardiomegaly and dense atherosclerotic coronary artery calcifications.    Past Medical History:  Diagnosis Date  . Allergic rhinitis 04/28/2016   Last Assessment & Plan:  Continue astelin  . Anemia 07/01/2019  . 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 In light of concurrent left nephrolithiasis, will check CT renal colic next year instead of renal [place]  . Marland Kitchennticoagulated on Coumadin 01/04/2018  . Anxiety 05/10/2016   Last Assessment & Plan:  Doing well off of zoloft.  . Asthma   . Asymptomatic microscopic hematuria 06/20/2017   Last Assessment & Plan:  [facility] [place] [facility] [place] and cystoscopy. UA with  2+ blood last visit - we discussed recommendation for repeat workup at 5 years or if degree of hematuria progresses.   . Atrial fibrillation ([place]) 03/29/2017  . Atrial flutter ([place]) 03/29/2017  . Backache 12/14/2015   Last Assessment & Plan:  Pain management referral for further evaluation.  . Bilateral pleural effusion 08/09/2017  . Chronic allergic rhinitis 04/28/2016   Last Assessment & Plan:  Continue astelin  . Chronic anticoagulation 03/29/2017  . Chronic atrial fibrillation ([place]) 07/23/2015   Last Assessment & Plan:  Coumadin and metoprolol,  cardiology referral to establish care.  . Chronic midline back pain 12/14/2015   Last Assessment & Plan:  Pain management referral for further evaluation.  . Chronic obstructive lung disease ([place]) 06/12/2016   With hypoxia  . Chronic prostatitis 07/23/2015   Last Assessment & Plan:  Has largely resolved since stopping bike riding. Recommend annual DRE AND PSA - will see back 12/2015 for annual screening, given 1st degree fhx. To call office for recurrent prostatitis symptoms.   . Chronic respiratory failure with hypoxia ([place]) 07/29/2019  . Cirrhosis of liver not due to alcohol ([place]) 07/01/2019  . COPD (chronic obstructive pulmonary disease) ([place]) 06/12/2016  . Coronary arteriosclerosis in native artery 03/29/2017  . Coronary artery disease involving native coronary artery of native heart with angina pectoris ([place]) 03/29/2017  . Cough 10/17/2016   Last Assessment & Plan:  Discussed typical course for acute viral illness. If symptoms worsen or fail to improve by 7-10d, delayed ATBs, fluids, rest, NSAIDs/APAP prn. Seek care if not improving. Needs earlier INR check due to ATBs.  [name] Dyspnea 02/01/2016   Last Assessment & Plan:  Overall improving, eval by pulm, plan for CT, neg stress test with cardiology. Recent switch to carvedilol due to side effects.  . Encounter for therapeutic drug monitoring 01/06/2019  . Enterococcal bacteremia   . Epidermoid cyst of skin 08/24/2017  . Essential hypertension 12/14/2015   Last Assessment & Plan:  Hypertension control: controlled  Medications: compliant Medication Management: as noted in orders Home blood pressure monitoring recommended additionally as needed for symptoms  The patient's care plan was reviewed and updated. Instructions and counseling were provided regarding patient goals and barriers. He was counseled to adopt a healthy lifestyle. Educational resources and self-management tools have been provided as charted in [facility] list.   . H/O maze procedure 03/29/2017  . H/O  mechanical aortic valve replacement 03/29/2017   Overview:  2011  . History of coronary artery bypass graft 03/29/2017  . Hx of CABG 03/29/2017  . Hyperlipidemia 03/29/2017  . Hypersensitivity angiitis ([place]) 10/01/2017  . Hypertensive heart disease 03/29/2017  . Hypertensive heart disease with heart failure ([place]) 03/29/2017  . Hypertensive heart failure ([place]) 03/29/2017  . Hypokalemia due to excessive renal loss of potassium 02/18/2018  . Hypotension, chronic 06/06/2018  . International normalized ratio (INR) raised 07/26/2017  . Kidney stone 07/23/2015  . Kidney stones 07/23/2015   Overview:  x 3  Last Assessment & Plan:  By [place] has left nephrolithiasis, but not visible by KUB. Will check CT renal colic next year to assess both stone burden as well as to surveil AML.   [name] Left ureteral stone 01/23/2018  . Leukocytoclastic vasculitis ([place]) 10/01/2017  . Localized edema 01/04/2018  . Long term (current) use of anticoagulants 03/29/2017  . Lumbar radicular pain 01/19/2016  . Lumbar radiculopathy 01/19/2016  . Maculopapular rash 09/03/2017  . Microscopic hematuria 06/20/2017   Last Assessment & Plan:  Had hematuria  workup in [facility] in [facility] which negative CT and cystoscopy. UA with 2+ blood last visit - we discussed recommendation for repeat workup at 5 years or if degree of hematuria progresses.   . Multiple nodules of lung 06/12/2016  . Nasal discharge 02/25/2016   Last Assessment & Plan:  Trial zyrtec and flonase  . Nephrolithiasis 07/23/2015   Overview:  x 3  Last Assessment & Plan:  By [place] has left nephrolithiasis, but not visible by KUB. Will check CT renal colic next year to assess both stone burden as well as to surveil AML.   Overview:  x 3  Last Assessment & Plan:  Has 24m nonobstructing LUP stone - not visible by KUB.  Will check renal [place]8/2019 - he will contact office sooner if symptomatic.   . Non-sustained ventricular tachycardia ([place] 03/29/2017  . Nonsustained ventricular tachycardia ([place] 03/29/2017  . Other  hyperlipidemia 03/29/2017  . Palpitations 10/01/2017  . Paroxysmal atrial fibrillation ([place] 03/29/2017  . Paroxysmal atrial fibrillation ([place] 03/29/2017  . Pleural effusion, bilateral 08/09/2017  . Post-nasal drainage 02/25/2016   Last Assessment & Plan:  Trial zyrtec and flonase  . Prostate cancer screening 06/20/2017   Last Assessment & Plan:  Recommend continued annual CaP screening until within 10 years of life expectancy. Given good health and fhx of longevity, would anticipate CaP screening to continue until age 74  PSA today and again in one year on day of visit.  . Pulmonary arterial hypertension ([place] 02/20/2018  . Pulmonary edema   . Pulmonary hypertension ([place]ket[place] 08/09/2017  . Pulmonary nodules 06/12/2016  . S/P AVR 03/20/2016  . S/P AVR (aortic valve replacement) 03/20/2016  . Strain of deltoid muscle, initial encounter   . Supratherapeutic INR 07/26/2017  . Syncope 03/29/2017  . Syncope and collapse 02/01/2016  . Typical atrial flutter ([place] 02/01/2016    Past Surgical History:  Procedure Laterality Date  . CHOLECYSTECTOMY    . CORONARY ARTERY BYPASS GRAFT    . EXPLORATORY LAPAROTOMY  07/30/2017  . FOOT SURGERY    . FRACTURE SURGERY Right    wrist and forearm  . HERNIA REPAIR    . MECHANICAL AORTIC VALVE REPLACEMENT    . NASAL SINUS SURGERY    . RIGHT HEART CATH N/A 01/18/2018   Procedure: RIGHT HEART CATH;  Surgeon: BJolaine Artist MD;  [facility];  Service: Cardiovascular;  Laterality: N/A;  . RIGHT HEART CATH N/A 05/14/2018   Procedure: RIGHT HEART CATH;  Surgeon: BJolaine Artist MD;  [facility];  Service: Cardiovascular;  Laterality: N/A;  . TEE WITHOUT CARDIOVERSION N/A 05/21/2018   Procedure: TRANSESOPHAGEAL ECHOCARDIOGRAM (TEE);  Surgeon: BJolaine Artist MD;  [facility];  Service: Cardiovascular;  Laterality: N/A;  . UPPER GASTROINTESTINAL ENDOSCOPY  07/12/2017   Patchy areas of mucosal inflammation noted in the antrum  with edema,erthema and ulcerations. Bx. Chronicfocally active gastritis.  .Marland KitchenVASECTOMY      Current Medications: Current Meds  Medication Sig  . acetaminophen (TYLENOL) 650 MG CR tablet Take 1 tablet (650 mg total) by mouth every 8 (eight) hours as needed for pain. Not to use with norco since combined with tylenol.  .Marland Kitchenaspirin EC 81 MG tablet Take 81 mg by mouth daily.  . B Complex-C (B-COMPLEX WITH VITAMIN C) tablet Take 1 tablet by mouth daily.   . Cholecalciferol (VITAMIN D) 125 MCG (5000 UT) CAPS Take 1 capsule by mouth daily.  . macitentan (OPSUMIT) 10  MG tablet Take 1 tablet (10 mg total) by mouth daily.  . Magnesium Gluconate (MAGNESIUM 27 PO) Take 1 tablet by mouth every other day.   . metolazone (ZAROXOLYN) 2.5 MG tablet Take 2.5 mg by mouth as needed.  . midodrine (PROAMATINE) 10 MG tablet Take 1 tablet (10 mg total) by mouth 3 (three) times daily with meals.  . potassium chloride (KLOR-CON) 20 MEQ packet Take 40 mEq by mouth daily.  . rosuvastatin (CRESTOR) 5 MG tablet TAKE 1 TABLET(5 MG) BY MOUTH DAILY  . Selexipag (UPTRAVI) 400 MCG TABS Take 400 mcg by mouth 2 (two) times daily.  [name] spironolactone (ALDACTONE) 25 MG tablet TAKE 1/2 OF A TABLET BY MOUTH ONCE DAILY.  . tadalafil, PAH, (ADCIRCA) 20 MG tablet Take 2 tablets (40 mg total) by mouth daily.  [name] torsemide (DEMADEX) 20 MG tablet Take 20 mg by mouth as directed. Takes 2-3 tablets daily  . Vitamin D, Ergocalciferol, (DRISDOL) 1.25 MG (50000 UNIT) CAPS capsule Take 1 capsule (50,000 Units total) by mouth every 7 (seven) days.  [name] warfarin (COUMADIN) 5 MG tablet TAKE 1/2 TO 1 TABLET DAILY AS DIRECTED BY COUMADIN CLINIC     Allergies:   Protonix [pantoprazole sodium], Amoxicillin-pot clavulanate, and Tape   Social History   Socioeconomic History  . Marital status: Single    Spouse name: Not on file  . Number of children: Not on file  . Years of education: Not on file  . Highest education level: Not on file  Occupational  History  . Not on file  Tobacco Use  . Smoking status: Former Smoker    Packs/day: 2.00    Years: 34.00    Pack years: 68.00    Types: Cigarettes    Quit date: 07/16/2000    Years since quitting: 19.9  . Smokeless tobacco: Never Used  Vaping Use  . Vaping Use: Never used  Substance and Sexual Activity  . Alcohol use: Not Currently  . Drug use: No  . Sexual activity: Not on file  Other Topics Concern  . Not on file  Social History Narrative  . Not on file   Social Determinants of Health   Financial Resource Strain: Low Risk   . Difficulty of Paying Living Expenses: Not hard at all  Food Insecurity: No Food Insecurity  . Worried About Charity fundraiser in the Last Year: Never true  . Ran Out of Food in the Last Year: Never true  Transportation Needs: No Transportation Needs  . Lack of Transportation (Medical): No  . Lack of Transportation (Non-Medical): No  Physical Activity:   . Days of Exercise per Week:   . Minutes of Exercise per Session:   Stress:   . Feeling of Stress :   Social Connections:   . Frequency of Communication with Friends and Family:   . Frequency of Social Gatherings with Friends and Family:   . Attends Religious Services:   . Active Member of Clubs or Organizations:   . Attends Archivist Meetings:   [name] Marital Status:      Family History: The patient's family history includes Arthritis in his mother; Asthma in his mother; Heart attack in his father; Hypertension in his father; Stroke in his paternal grandmother. There is no history of Colon cancer.   Recent Labs: 03/17/2020: B Natriuretic Peptide 509.4; Hemoglobin 13.1; Platelets 212 04/27/2020: ALT 15 05/17/2020: BUN 39; Creatinine, Ser 1.52; Magnesium 2.6; Potassium 4.0; Sodium 134  Recent Lipid Panel  Component Value Date/Time   CHOL 125 10/21/2018 1028   TRIG 57 10/21/2018 1028   HDL 43 10/21/2018 1028   CHOLHDL 2.9 10/21/2018 1028   VLDL 11 10/21/2018 1028   LDLCALC 71  10/21/2018 1028    Physical Exam:    VS:  BP 110/70   Pulse 77   Wt 66.2 kg (146 lb)   SpO2 97%   BMI 20.95 kg/m     Wt Readings from Last 3 Encounters:  06/21/20 66.2 kg (146 lb)  06/17/20 67.1 kg (148 lb)  05/17/20 68.6 kg (151 lb 3.2 oz)    General:  Crampy. Fidgety. Stretching in clinic No resp difficulty HEENT: normal Neck: supple. no JVD. Carotids 2+ bilat; no bruits. No lymphadenopathy or thryomegaly appreciated. Cor: PMI nondisplaced. irregular rate & rhythm. 2/6 SEM RSB. Mechanical S2 crisp Lungs: clear Abdomen: soft, nontender, nondistended. No hepatosplenomegaly. No bruits or masses. Good bowel sounds. Extremities: no cyanosis, clubbing, rash, edema Neuro: alert & orientedx3, cranial nerves grossly intact. moves all 4 extremities w/o difficulty. Affect pleasant    ASSESSMENT/PLAN:    1. Pulmonary hypertension with cor pulmonale/RV failure - WHO Group I  -? Component ofHHT/shunt/AVMs with late bubbles on bubble study.  - Echo with bubble study 05/16/18 LVEF 55-60% with "late bubbles" , Mechanical AVR stable with trivial perivalvular regurg, Mild MR, Severe LAE, Severe RV dilation and reduced function. No PFO. Mod TR, PA peak pressure 68 mm Hg - Echo 10/19: EF 55-60% mild LVH; s/p AVR with mean gradient 11 mmHg and trace AI; mild MR; severe biatrial enlargement; mild RVE; severe TR; severe pulmonary hypertension. RVSP 28mHG - Echo 03/17/20 EF 45-50% AVR ok. Septal flattening RV dilated mild HK. Moderate TR RVSP 80 severe bilateral enlargement. Personally reviewed - RHC 1/19 with mod/severe pulm HTN with RV failure.  - RHC 05/14/18 with Mild/Moderate PAH in setting of high output with no evidence of intracardiac shunting. - Ab u/s with cirrhosis but no evidence of portal HTN - Improved with Macitentan and Adcirca. Now back on Uptravi at 800/600. Having side effects. Cut dose to 600/400. Can go to 400 bid as needed. Once on stable dose of Uptravi for 3 months will  repeat RHC - Stable NYHA II. Continues to exercise 6x/week. Check 6MW next visit  - Volume status ok - Continue torsemide 40 daily alternating with 60 daily. Using metolazone 2.581mwith K 40 meq about once a week  - Continue spiro 12.5 mg daily. - Continue midodrine 10 mg tid (will take 1556mf BP is low) - Auto-immune serologies negative (checked twice) - Check labs today  2. Chronic respiratory failure - [place] CT 5/23/19with "dependent basilar predominant patchy subpleural reticulation and ground-glass attenuation with the suggestion of minimal associated traction bronchiolectasis. No frank honeycombing. Findings may indicate an interstitial lung disease such as early usual interstitial pneumonia (UIP) or fibrotic phase nonspecific interstitial pneumonia (NSIP)." -Followed by Dr. ByrLamonte Sakai. Chronic AFL - rate controlled.  Now off metoprolol. Continue coumadin with mechanical AVR. - No change today  4. CAD - s/p CABG 07/2017 with Dr AllRoderic Palau CarToomsuba- No s/s ischemia - continue ASA with CAD and AVR - Unable to tolerate atorva due to myalgias.  -ContinueCrestor 5mg81mily. Tolerating well - Lipids followed by Dr. MunlBettina Gavia S/p mechanical AVR - stable on echo 3/21 Continue coumadin/ASA 81. INR goal is 2.5-3 per Dr MunlBettina Gaviaaware of need for SBE prophylaxis  6. Leukocytoclastic vasculitis - f/u with  Rheumatology. No change.  7. Cirrhosis - US Abdomen RUQ 05/16/18 - s/p cholecystectomy. + hepatic cirrhosis. Mild ascites.  - Liver Doppler 05/16/18 - No hepatic, splenic, or portal venous thrombosis or occlusion. Mild ascites. - Continue midodrine 10 mg am, afternoon, 15 mg HS - no change  8. Iron-def anemia - followed by PCP   Lance Bickers, MD  1:30 PM

## 2020-06-21 ENCOUNTER — Other Ambulatory Visit: Payer: Self-pay

## 2020-06-21 ENCOUNTER — Ambulatory Visit (HOSPITAL_COMMUNITY)
Admission: RE | Admit: 2020-06-21 | Discharge: 2020-06-21 | Disposition: A | Discharge status: Home / Self Care | Payer: Medicare Other | Source: Ambulatory Visit | Attending: Internal Medicine | Admitting: Internal Medicine

## 2020-06-21 ENCOUNTER — Encounter (HOSPITAL_COMMUNITY): Payer: Self-pay | Admitting: Internal Medicine

## 2020-06-21 VITALS — BP 110/70 | HR 77 | Wt 146.0 lb

## 2020-06-21 DIAGNOSIS — I776 Arteritis, unspecified: Secondary | ICD-10-CM | POA: Insufficient documentation

## 2020-06-21 DIAGNOSIS — I4892 Unspecified atrial flutter: Secondary | ICD-10-CM

## 2020-06-21 DIAGNOSIS — D509 Iron deficiency anemia, unspecified: Secondary | ICD-10-CM | POA: Diagnosis not present

## 2020-06-21 DIAGNOSIS — F419 Anxiety disorder, unspecified: Secondary | ICD-10-CM | POA: Diagnosis not present

## 2020-06-21 DIAGNOSIS — E785 Hyperlipidemia, unspecified: Secondary | ICD-10-CM | POA: Insufficient documentation

## 2020-06-21 DIAGNOSIS — I482 Chronic atrial fibrillation, unspecified: Secondary | ICD-10-CM | POA: Insufficient documentation

## 2020-06-21 DIAGNOSIS — Z86018 Personal history of other benign neoplasm: Secondary | ICD-10-CM | POA: Insufficient documentation

## 2020-06-21 DIAGNOSIS — Z888 Allergy status to other drugs, medicaments and biological substances status: Secondary | ICD-10-CM | POA: Insufficient documentation

## 2020-06-21 DIAGNOSIS — Z79899 Other long term (current) drug therapy: Secondary | ICD-10-CM | POA: Insufficient documentation

## 2020-06-21 DIAGNOSIS — Z8619 Personal history of other infectious and parasitic diseases: Secondary | ICD-10-CM | POA: Insufficient documentation

## 2020-06-21 DIAGNOSIS — J449 Chronic obstructive pulmonary disease, unspecified: Secondary | ICD-10-CM | POA: Insufficient documentation

## 2020-06-21 DIAGNOSIS — Z7901 Long term (current) use of anticoagulants: Secondary | ICD-10-CM | POA: Insufficient documentation

## 2020-06-21 DIAGNOSIS — R188 Other ascites: Secondary | ICD-10-CM | POA: Insufficient documentation

## 2020-06-21 DIAGNOSIS — Z951 Presence of aortocoronary bypass graft: Secondary | ICD-10-CM | POA: Insufficient documentation

## 2020-06-21 DIAGNOSIS — I509 Heart failure, unspecified: Secondary | ICD-10-CM | POA: Insufficient documentation

## 2020-06-21 DIAGNOSIS — I2510 Atherosclerotic heart disease of native coronary artery without angina pectoris: Secondary | ICD-10-CM | POA: Insufficient documentation

## 2020-06-21 DIAGNOSIS — Z87891 Personal history of nicotine dependence: Secondary | ICD-10-CM | POA: Diagnosis not present

## 2020-06-21 DIAGNOSIS — Z952 Presence of prosthetic heart valve: Secondary | ICD-10-CM | POA: Diagnosis not present

## 2020-06-21 DIAGNOSIS — I272 Pulmonary hypertension, unspecified: Secondary | ICD-10-CM | POA: Diagnosis not present

## 2020-06-21 DIAGNOSIS — I959 Hypotension, unspecified: Secondary | ICD-10-CM | POA: Insufficient documentation

## 2020-06-21 DIAGNOSIS — I480 Paroxysmal atrial fibrillation: Secondary | ICD-10-CM | POA: Diagnosis not present

## 2020-06-21 DIAGNOSIS — E7849 Other hyperlipidemia: Secondary | ICD-10-CM | POA: Diagnosis not present

## 2020-06-21 DIAGNOSIS — I110 Hypertensive heart disease with heart failure: Secondary | ICD-10-CM | POA: Diagnosis not present

## 2020-06-21 DIAGNOSIS — K7460 Unspecified cirrhosis of liver: Secondary | ICD-10-CM | POA: Diagnosis not present

## 2020-06-21 DIAGNOSIS — Z8719 Personal history of other diseases of the digestive system: Secondary | ICD-10-CM | POA: Insufficient documentation

## 2020-06-21 DIAGNOSIS — J9611 Chronic respiratory failure with hypoxia: Secondary | ICD-10-CM | POA: Diagnosis not present

## 2020-06-21 DIAGNOSIS — Z880 Allergy status to penicillin: Secondary | ICD-10-CM | POA: Insufficient documentation

## 2020-06-21 DIAGNOSIS — I4821 Permanent atrial fibrillation: Secondary | ICD-10-CM | POA: Diagnosis not present

## 2020-06-21 DIAGNOSIS — I2721 Secondary pulmonary arterial hypertension: Secondary | ICD-10-CM | POA: Diagnosis not present

## 2020-06-21 DIAGNOSIS — Z9049 Acquired absence of other specified parts of digestive tract: Secondary | ICD-10-CM | POA: Diagnosis not present

## 2020-06-21 DIAGNOSIS — Z8249 Family history of ischemic heart disease and other diseases of the circulatory system: Secondary | ICD-10-CM | POA: Insufficient documentation

## 2020-06-21 DIAGNOSIS — Z9889 Other specified postprocedural states: Secondary | ICD-10-CM | POA: Diagnosis not present

## 2020-06-21 DIAGNOSIS — Z7982 Long term (current) use of aspirin: Secondary | ICD-10-CM | POA: Insufficient documentation

## 2020-06-21 LAB — CBC
HCT: 46.3 % (ref 39.0–52.0)
Hemoglobin: 15.2 g/dL (ref 13.0–17.0)
MCH: 28.8 pg (ref 26.0–34.0)
MCHC: 32.8 g/dL (ref 30.0–36.0)
MCV: 87.7 fL (ref 80.0–100.0)
Platelets: 254 10*3/uL (ref 150–400)
RBC: 5.28 MIL/uL (ref 4.22–5.81)
RDW: 15.2 % (ref 11.5–15.5)
WBC: 8.3 10*3/uL (ref 4.0–10.5)
nRBC: 0 % (ref 0.0–0.2)

## 2020-06-21 LAB — COMPREHENSIVE METABOLIC PANEL
ALT: 23 U/L (ref 0–44)
AST: 43 U/L — ABNORMAL HIGH (ref 15–41)
Albumin: 4.5 g/dL (ref 3.5–5.0)
Alkaline Phosphatase: 52 U/L (ref 38–126)
Anion gap: 12 (ref 5–15)
BUN: 31 mg/dL — ABNORMAL HIGH (ref 8–23)
CO2: 27 mmol/L (ref 22–32)
Calcium: 9.9 mg/dL (ref 8.9–10.3)
Chloride: 95 mmol/L — ABNORMAL LOW (ref 98–111)
Creatinine, Ser: 1.5 mg/dL — ABNORMAL HIGH (ref 0.61–1.24)
GFR calc Af Amer: 53 mL/min — ABNORMAL LOW (ref >60)
GFR calc non Af Amer: 46 mL/min — ABNORMAL LOW (ref >60)
Glucose, Bld: 111 mg/dL — ABNORMAL HIGH (ref 70–99)
Potassium: 3.2 mmol/L — ABNORMAL LOW (ref 3.5–5.1)
Sodium: 134 mmol/L — ABNORMAL LOW (ref 135–145)
Total Bilirubin: 1.8 mg/dL — ABNORMAL HIGH (ref 0.3–1.2)
Total Protein: 8.8 g/dL — ABNORMAL HIGH (ref 6.5–8.1)

## 2020-06-21 LAB — BRAIN NATRIURETIC PEPTIDE: B Natriuretic Peptide: 154.4 pg/mL — ABNORMAL HIGH (ref 0.0–100.0)

## 2020-06-21 NOTE — Patient Instructions (Addendum)
Labs done today. We will contact you only if your labs are abnormal.  No medication changes were made. Please continue all current medications as prescribed.  Your physician recommends that you schedule a follow-up appointment in: 4 months. Please contact our office in September for an October appointment.  If you have any questions or concerns before your next appointment please send [place] a message through [place] or call our office at [phone].    TO LEAVE A MESSAGE FOR THE NURSE SELECT OPTION 2, PLEASE LEAVE A MESSAGE INCLUDING: . YOUR NAME . DATE OF BIRTH . CALL BACK NUMBER . REASON FOR CALL**this is important as we prioritize the call backs  [place] AS LONG AS YOU CALL BEFORE 4:00 PM   At the [facility], you and your health needs are our priority. As part of our continuing mission to provide you with exceptional heart care, we have created designated Provider Care Teams. These Care Teams include your primary Cardiologist (physician) and Advanced Practice Providers (APPs- Physician Assistants and Nurse Practitioners) who all work together to provide you with the care you need, when you need it.   You may see any of the following providers on your designated Care Team at your next follow up: [name] Dr Glori Bickers . Dr Loralie Champagne . Darrick Grinder, NP . Lyda Jester, PA . Audry Riles, PharmD   Please be sure to bring in all your medications bottles to every appointment.

## 2020-06-22 ENCOUNTER — Ambulatory Visit (INDEPENDENT_AMBULATORY_CARE_PROVIDER_SITE_OTHER): Payer: Medicare Other | Admitting: *Deleted

## 2020-06-22 DIAGNOSIS — Z5181 Encounter for therapeutic drug level monitoring: Secondary | ICD-10-CM | POA: Diagnosis not present

## 2020-06-22 DIAGNOSIS — Z7901 Long term (current) use of anticoagulants: Secondary | ICD-10-CM | POA: Diagnosis not present

## 2020-06-22 DIAGNOSIS — I4892 Unspecified atrial flutter: Secondary | ICD-10-CM

## 2020-06-22 LAB — POCT INR: INR: 2.2 (ref 2.0–3.0)

## 2020-06-22 NOTE — Patient Instructions (Signed)
Take warfarin 1 tablet tonight then resume 1/2 tablet daily except 1 tablet on Sunday and  Wednesdays.  Please call our office with any medication changes or concerns [phone]. Return in 4 weeks for INR check.

## 2020-07-13 ENCOUNTER — Other Ambulatory Visit: Payer: Self-pay

## 2020-07-13 ENCOUNTER — Ambulatory Visit (HOSPITAL_COMMUNITY)
Admission: RE | Admit: 2020-07-13 | Discharge: 2020-07-13 | Disposition: A | Discharge status: Home / Self Care | Payer: Medicare Other | Source: Ambulatory Visit | Attending: Cardiology | Admitting: Cardiology

## 2020-07-13 DIAGNOSIS — I110 Hypertensive heart disease with heart failure: Secondary | ICD-10-CM | POA: Diagnosis not present

## 2020-07-13 LAB — BASIC METABOLIC PANEL
Anion gap: 10 (ref 5–15)
BUN: 36 mg/dL — ABNORMAL HIGH (ref 8–23)
CO2: 26 mmol/L (ref 22–32)
Calcium: 9.5 mg/dL (ref 8.9–10.3)
Chloride: 95 mmol/L — ABNORMAL LOW (ref 98–111)
Creatinine, Ser: 1.28 mg/dL — ABNORMAL HIGH (ref 0.61–1.24)
GFR calc Af Amer: >60 mL/min (ref >60)
GFR calc non Af Amer: 55 mL/min — ABNORMAL LOW (ref >60)
Glucose, Bld: 109 mg/dL — ABNORMAL HIGH (ref 70–99)
Potassium: 3.1 mmol/L — ABNORMAL LOW (ref 3.5–5.1)
Sodium: 131 mmol/L — ABNORMAL LOW (ref 135–145)

## 2020-07-20 ENCOUNTER — Other Ambulatory Visit: Payer: Self-pay

## 2020-07-20 ENCOUNTER — Ambulatory Visit (INDEPENDENT_AMBULATORY_CARE_PROVIDER_SITE_OTHER): Payer: Medicare Other

## 2020-07-20 DIAGNOSIS — Z5181 Encounter for therapeutic drug level monitoring: Secondary | ICD-10-CM

## 2020-07-20 DIAGNOSIS — I4892 Unspecified atrial flutter: Secondary | ICD-10-CM | POA: Diagnosis not present

## 2020-07-20 DIAGNOSIS — Z7901 Long term (current) use of anticoagulants: Secondary | ICD-10-CM | POA: Diagnosis not present

## 2020-07-20 LAB — POCT INR: INR: 2.6 (ref 2.0–3.0)

## 2020-07-20 NOTE — Patient Instructions (Signed)
Continue taking 1/2 tablet daily except 1 tablet on Sunday and  Wednesdays.  Please call our office with any medication changes or concerns [phone]. Return in 5 weeks for INR check.

## 2020-07-22 ENCOUNTER — Telehealth (HOSPITAL_COMMUNITY): Payer: Self-pay | Admitting: *Deleted

## 2020-07-22 DIAGNOSIS — E876 Hypokalemia: Secondary | ICD-10-CM

## 2020-07-22 MED ORDER — POTASSIUM CHLORIDE CRYS ER 20 MEQ PO TBCR
40.0000 meq | EXTENDED_RELEASE_TABLET | Freq: Two times a day (BID) | ORAL | 3 refills | Status: DC
Start: 2020-07-22 — End: 2021-03-11

## 2020-07-22 NOTE — Telephone Encounter (Signed)
-----  Message from Jolaine Artist, MD sent at 07/22/2020 12:49 PM EDT ----- Increase KDur to 40 bid. Repeat BMET next week.

## 2020-07-22 NOTE — Telephone Encounter (Signed)
Pt aware, agreeable, and verbalized understanding, new rx for kcl sent in, pt request to have labs done at [facility] in [place], order placed and faxed to them

## 2020-08-09 ENCOUNTER — Other Ambulatory Visit: Payer: Self-pay | Admitting: Internal Medicine

## 2020-08-09 DIAGNOSIS — E876 Hypokalemia: Secondary | ICD-10-CM | POA: Diagnosis not present

## 2020-08-09 DIAGNOSIS — I270 Primary pulmonary hypertension: Secondary | ICD-10-CM | POA: Diagnosis not present

## 2020-08-10 LAB — BASIC METABOLIC PANEL
BUN/Creatinine Ratio: 28 — ABNORMAL HIGH (ref 10–24)
BUN: 33 mg/dL — ABNORMAL HIGH (ref 8–27)
CO2: 25 mmol/L (ref 20–29)
Calcium: 10.1 mg/dL (ref 8.6–10.2)
Chloride: 96 mmol/L (ref 96–106)
Creatinine, Ser: 1.2 mg/dL (ref 0.76–1.27)
GFR calc Af Amer: 69 mL/min/{1.73_m2} (ref >59)
GFR calc non Af Amer: 60 mL/min/{1.73_m2} (ref >59)
Glucose: 85 mg/dL (ref 65–99)
Potassium: 4.1 mmol/L (ref 3.5–5.2)
Sodium: 137 mmol/L (ref 134–144)

## 2020-08-10 LAB — SPECIMEN STATUS REPORT

## 2020-08-11 ENCOUNTER — Other Ambulatory Visit (HOSPITAL_COMMUNITY): Payer: Self-pay | Admitting: *Deleted

## 2020-08-11 MED ORDER — UPTRAVI 400 MCG PO TABS: 400.0000 ug | ORAL_TABLET | Freq: Two times a day (BID) | ORAL | 4 refills | Status: DC

## 2020-08-17 ENCOUNTER — Other Ambulatory Visit (HOSPITAL_COMMUNITY): Payer: Self-pay | Admitting: Internal Medicine

## 2020-08-24 ENCOUNTER — Ambulatory Visit (INDEPENDENT_AMBULATORY_CARE_PROVIDER_SITE_OTHER): Payer: Medicare Other

## 2020-08-24 ENCOUNTER — Other Ambulatory Visit: Payer: Self-pay

## 2020-08-24 DIAGNOSIS — Z5181 Encounter for therapeutic drug level monitoring: Secondary | ICD-10-CM

## 2020-08-24 DIAGNOSIS — Z7901 Long term (current) use of anticoagulants: Secondary | ICD-10-CM | POA: Diagnosis not present

## 2020-08-24 DIAGNOSIS — I4892 Unspecified atrial flutter: Secondary | ICD-10-CM | POA: Diagnosis not present

## 2020-08-24 LAB — POCT INR: INR: 2.4 (ref 2.0–3.0)

## 2020-08-24 MED ORDER — WARFARIN SODIUM 5 MG PO TABS: ORAL_TABLET | ORAL | 3 refills | Status: DC

## 2020-08-24 NOTE — Patient Instructions (Signed)
Take 1 tablet tonight and then Continue taking 1/2 tablet daily except 1 tablet on Sunday and  Wednesdays.  Please call our office with any medication changes or concerns [phone]. Return in 4 weeks for INR check.

## 2020-09-08 ENCOUNTER — Other Ambulatory Visit (HOSPITAL_COMMUNITY): Payer: Self-pay | Admitting: *Deleted

## 2020-09-08 MED ORDER — ROSUVASTATIN CALCIUM 5 MG PO TABS: ORAL_TABLET | ORAL | 3 refills | Status: DC

## 2020-09-17 ENCOUNTER — Ambulatory Visit
Admission: RE | Admit: 2020-09-17 | Discharge: 2020-09-17 | Disposition: A | Discharge status: Home / Self Care | Payer: Medicare Other | Source: Ambulatory Visit | Attending: Emergency Medicine | Admitting: Emergency Medicine

## 2020-09-17 DIAGNOSIS — I700 Atherosclerosis of aorta: Secondary | ICD-10-CM | POA: Diagnosis not present

## 2020-09-17 DIAGNOSIS — R918 Other nonspecific abnormal finding of lung field: Secondary | ICD-10-CM

## 2020-09-17 DIAGNOSIS — J432 Centrilobular emphysema: Secondary | ICD-10-CM | POA: Diagnosis not present

## 2020-09-17 DIAGNOSIS — J984 Other disorders of lung: Secondary | ICD-10-CM | POA: Diagnosis not present

## 2020-09-17 DIAGNOSIS — S2241XA Multiple fractures of ribs, right side, initial encounter for closed fracture: Secondary | ICD-10-CM | POA: Diagnosis not present

## 2020-09-20 ENCOUNTER — Encounter: Payer: Self-pay | Admitting: Emergency Medicine

## 2020-09-20 ENCOUNTER — Ambulatory Visit (INDEPENDENT_AMBULATORY_CARE_PROVIDER_SITE_OTHER): Payer: Medicare Other | Admitting: Emergency Medicine

## 2020-09-20 ENCOUNTER — Other Ambulatory Visit: Payer: Self-pay

## 2020-09-20 VITALS — BP 122/68 | HR 65 | Temp 97.8°F | Ht 69.5 in | Wt 149.2 lb

## 2020-09-20 DIAGNOSIS — Z23 Encounter for immunization: Secondary | ICD-10-CM

## 2020-09-20 DIAGNOSIS — I2510 Atherosclerotic heart disease of native coronary artery without angina pectoris: Secondary | ICD-10-CM

## 2020-09-20 DIAGNOSIS — I2721 Secondary pulmonary arterial hypertension: Secondary | ICD-10-CM | POA: Diagnosis not present

## 2020-09-20 DIAGNOSIS — R918 Other nonspecific abnormal finding of lung field: Secondary | ICD-10-CM

## 2020-09-20 MED ORDER — ALBUTEROL SULFATE HFA 108 (90 BASE) MCG/ACT IN AERS
2.0000 | INHALATION_SPRAY | Freq: Four times a day (QID) | RESPIRATORY_TRACT | 6 refills | Status: DC | PRN
Start: 2020-09-20 — End: 2021-08-04

## 2020-09-20 NOTE — Patient Instructions (Addendum)
Your CT chest shows that your pulmonary nodules are stable. You should not need a repeat scan to follow this unless there is a clinical change.  We will prescribe an albuterol inhaler. Please use 2 puffs if you have shortness of breath, wheeze, chest tightness.  Continue your cardiac medications as directed by Cardiology.  COVID vaccine up to date. Flu shot today.  Follow with Dr. Lamonte Sakai in 12 months or sooner if you have any problems.

## 2020-09-20 NOTE — Assessment & Plan Note (Signed)
Unchanged on serial CTs, now stable for over 2 years.  He should not need a repeat CT scan to follow these nodules unless there is a clinical change.

## 2020-09-20 NOTE — Assessment & Plan Note (Signed)
He has a POC but his oxygenation is adequate, checks it frequently even with heavier exercise.  He does not need to use scheduled oxygen at this time.

## 2020-09-20 NOTE — Assessment & Plan Note (Signed)
Secondary to multiple left-sided heart factors, COPD, restrictive disease with some basilar scar.  Interestingly does not have clear evidence for hypoxemia.  He is tolerating targeted medical therapy for his PAH, anticoagulation.  Follows with the heart failure clinic

## 2020-09-20 NOTE — Progress Notes (Signed)
Subjective:    Patient ID: Lance Hicks, male    DOB: 1946-10-27, 74 y.o.   MRN: [id number]  HPI  ROV 07/29/2019 --annual follow-up for 74 year old gentleman with COPD, superimposed restrictive disease, hypoxemic respiratory failure.  He has secondary pulmonary hypertension due to this and also left-sided heart failure.  He is been on macitentan, tadalafil, diuretics, coumadin. Never reliably took O2 We have been following a 9 mm right middle lobe pulmonary nodule, most recent CT scan of the chest done on 03/10/2019 which shows interval stability.  Recommendation made for one final repeat CT in June 2021. He denies any dyspnea except when the barometric pressure changes. He has good exertional tolerance. He does not have any albuterol right now. No flares. No wheeze. He has am sinus drainage with some cough. He does not use his supplemental O2 except for prn. He checks his SpO2, no desaturations noted even w exertion.   ROV 09/20/20 --74 year old gentleman with a history of COPD and superimposed restrictive lung disease, secondary pulmonary hypertension due to this as well as left-sided heart failure (A. fib, CAD, aortic stenosis, CABG and AVR, hypertension).  Associated hypoxemia.  He has been managed on tadalafil, selexipag, macitentan, diuretics, Coumadin.  He is able to work out 5 days a week without SOB. He has chest tightness and some SOB when there are weather changes. Doesn't have an albuterol inhaler, never uses O2 even though he has a POC at home.   He underwent a repeat CT scan of the chest on 09/17/2020 which I have reviewed to follow pulmonary nodular disease in particular a noncalcified right middle lobe subpleural nodule.  Measured at 10 x 5 mm on this scan, stable.  Overall the scan is stable going back to 05/16/2018. He is not currently on any bronchodilator therapy.  His pulmonary function testing from 01/21/18 showed mixed restriction and obstruction with a diffusion  defect.    Review of Systems  Constitutional: Negative for fever and unexpected weight change.  HENT: Negative for congestion, dental problem, ear pain, nosebleeds, postnasal drip, rhinorrhea, sinus pressure, sneezing, sore throat and trouble swallowing.   Eyes: Negative for redness and itching.  Respiratory: Positive for shortness of breath. Negative for cough, chest tightness and wheezing.   Cardiovascular: Negative for palpitations and leg swelling.  Gastrointestinal: Negative for nausea and vomiting.  Genitourinary: Negative for dysuria.  Musculoskeletal: Negative for joint swelling.  Skin: Negative for rash.  Neurological: Negative for headaches.  Hematological: Does not bruise/bleed easily.  Psychiatric/Behavioral: Negative for dysphoric mood. The patient is not nervous/anxious.    was a Higher education careers adviser, has lived in [place], [place], [place] Was in [place], was in [place], no other inhaled exposures.  Has been exposed to paint fumes in the past     Objective:   Physical Exam Vitals:   09/20/20 1029  BP: 122/68  Pulse: 65  Temp: 97.8 F (36.6 C)  TempSrc: Temporal  SpO2: 98%  Weight: 149 lb 3.2 oz (67.7 kg)  Height: 5' 9.5" (1.765 m)   Gen: Pleasant, well-nourished, in no distress,  normal affect  ENT: No lesions,  mouth clear,  oropharynx clear, no postnasal drip  Neck: No JVD, no stridor  Lungs: No use of accessory muscles, no wheeze or crackles  Cardiovascular: RRR, soft syst M, click  Musculoskeletal: No deformities, no cyanosis or clubbing  Neuro: alert, non focal  Skin: Warm, no lesions or rash    Assessment & Plan:  Pulmonary nodules Unchanged on serial CTs,  now stable for over 2 years.  He should not need a repeat CT scan to follow these nodules unless there is a clinical change.  Chronic obstructive lung disease (HCC) Great functional capacity and very little evidence for obstructive symptoms.  He does have chest tightness, increased dyspnea when there are  changes in the weather.  I do not think he needs a scheduled bronchodilator at this time but he does need to have albuterol to use as needed.  We will prescribe today.  Chronic respiratory failure with hypoxia ([place]) He has a POC but his oxygenation is adequate, checks it frequently even with heavier exercise.  He does not need to use scheduled oxygen at this time.  Pulmonary arterial hypertension (HCC) Secondary to multiple left-sided heart factors, COPD, restrictive disease with some basilar scar.  Interestingly does not have clear evidence for hypoxemia.  He is tolerating targeted medical therapy for his PAH, anticoagulation.  Follows with the heart failure clinic  Baltazar Apo, MD, PhD 09/20/2020, 10:52 AM [facility] [phone] or if no answer [phone]

## 2020-09-20 NOTE — Assessment & Plan Note (Signed)
Great functional capacity and very little evidence for obstructive symptoms.  He does have chest tightness, increased dyspnea when there are changes in the weather.  I do not think he needs a scheduled bronchodilator at this time but he does need to have albuterol to use as needed.  We will prescribe today.

## 2020-09-21 ENCOUNTER — Ambulatory Visit (INDEPENDENT_AMBULATORY_CARE_PROVIDER_SITE_OTHER): Payer: Medicare Other

## 2020-09-21 ENCOUNTER — Encounter: Payer: Self-pay | Admitting: Cardiology

## 2020-09-21 ENCOUNTER — Ambulatory Visit (INDEPENDENT_AMBULATORY_CARE_PROVIDER_SITE_OTHER): Payer: Medicare Other | Admitting: Cardiology

## 2020-09-21 VITALS — BP 110/62 | HR 56 | Ht 69.5 in | Wt 147.0 lb

## 2020-09-21 DIAGNOSIS — Z952 Presence of prosthetic heart valve: Secondary | ICD-10-CM | POA: Diagnosis not present

## 2020-09-21 DIAGNOSIS — Z951 Presence of aortocoronary bypass graft: Secondary | ICD-10-CM | POA: Diagnosis not present

## 2020-09-21 DIAGNOSIS — I483 Typical atrial flutter: Secondary | ICD-10-CM

## 2020-09-21 DIAGNOSIS — I25119 Atherosclerotic heart disease of native coronary artery with unspecified angina pectoris: Secondary | ICD-10-CM

## 2020-09-21 DIAGNOSIS — Z7901 Long term (current) use of anticoagulants: Secondary | ICD-10-CM | POA: Diagnosis not present

## 2020-09-21 DIAGNOSIS — I272 Pulmonary hypertension, unspecified: Secondary | ICD-10-CM

## 2020-09-21 DIAGNOSIS — Z5181 Encounter for therapeutic drug level monitoring: Secondary | ICD-10-CM

## 2020-09-21 DIAGNOSIS — I4892 Unspecified atrial flutter: Secondary | ICD-10-CM | POA: Diagnosis not present

## 2020-09-21 LAB — POCT INR: INR: 2.7 (ref 2.0–3.0)

## 2020-09-21 NOTE — Addendum Note (Signed)
Addended by: Senaida Ores on: 09/21/2020 01:50 PM   Modules accepted: Orders

## 2020-09-21 NOTE — Patient Instructions (Signed)
Medication Instructions:  Your physician recommends that you continue on your current medications as directed. Please refer to the Current Medication list given to you today.  *If you need a refill on your cardiac medications before your next appointment, please call your pharmacy*   Lab Work: Your physician recommends that you return for lab work today Lipid  If you have labs (blood work) drawn today and your tests are completely normal, you will receive your results only by: [name] MyChart Message (if you have MyChart) OR . A paper copy in the mail If you have any lab test that is abnormal or we need to change your treatment, we will call you to review the results.   Testing/Procedures: None.    Follow-Up: At [facility], you and your health needs are our priority.  As part of our continuing mission to provide you with exceptional heart care, we have created designated Provider Care Teams.  These Care Teams include your primary Cardiologist (physician) and Advanced Practice Providers (APPs -  Physician Assistants and Nurse Practitioners) who all work together to provide you with the care you need, when you need it.  We recommend signing up for the patient portal called "MyChart".  Sign up information is provided on this After Visit Summary.  MyChart is used to connect with patients for Virtual Visits (Telemedicine).  Patients are able to view lab/test results, encounter notes, upcoming appointments, etc.  Non-urgent messages can be sent to your provider as well.   To learn more about what you can do with MyChart, go to [email or web address].    Your next appointment:   6 month(s)  The format for your next appointment:   In Person  Provider:   Jenne Campus, MD   Other Instructions

## 2020-09-21 NOTE — Progress Notes (Signed)
Cardiology Office Note:    Date:  09/21/2020   ID:  Lance Hicks, Lance Hicks 01/21/1946, MRN [id number]  PCP:  [name], PA-C  Cardiologist:  Jenne Campus, MD    Referring MD: Elise Benne   Chief Complaint  Patient presents with  . Follow-up  Doing very well  History of Present Illness:    Lance Hicks is a 73 y.o. male  with quite incredible past medical history.  That include coronary artery disease, status post coronary artery bypass graft as well as aortic valve replacement with 25 mm [employer] valve done in 2010, at the same time he had Maze procedure as well as left atrial appendage excision.  He does have history of pulmonary hypertension, chronic kidney failure, permanent what appears to be atypical atrial flutter.  He comes today to my office for follow-up.  Overall after made with all his problems he is doing amazingly well.  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  He complains of having leg pain.  Comes today 2 months of follow-up.  Doing great.  Walk 5 to 6 miles every single day have no difficulty doing it.  Yesterday he got flu vaccine and he said he feels really sick because of this otherwise asymptomatic.  Past Medical History:  Diagnosis Date  . Allergic rhinitis 04/28/2016   Last Assessment & Plan:  Continue astelin  . Anemia 07/01/2019  . 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 In light of concurrent left nephrolithiasis, will check CT renal colic next year instead of renal US.   [name] Anticoagulated on Coumadin 01/04/2018  . Anxiety 05/10/2016   Last Assessment & Plan:  Doing well off of zoloft.  . Asthma   . Asymptomatic microscopic hematuria 06/20/2017   Last Assessment & Plan:  Had hematuria workup in [facility] in 2016 which negative CT and cystoscopy. UA with 2+ blood last visit - we discussed recommendation for repeat workup at 5 years or if degree of hematuria  progresses.   . Atrial fibrillation ([place]) 03/29/2017  . Atrial flutter ([place]) 03/29/2017  . Backache 12/14/2015   Last Assessment & Plan:  Pain management referral for further evaluation.  . Bilateral pleural effusion 08/09/2017  . Chronic allergic rhinitis 04/28/2016   Last Assessment & Plan:  Continue astelin  . Chronic anticoagulation 03/29/2017  . Chronic atrial fibrillation ([place]) 07/23/2015   Last Assessment & Plan:  Coumadin and metoprolol, cardiology referral to establish care.  . Chronic midline back pain 12/14/2015   Last Assessment & Plan:  Pain management referral for further evaluation.  . Chronic obstructive lung disease ([place]) 06/12/2016   With hypoxia  . Chronic prostatitis 07/23/2015   Last Assessment & Plan:  Has largely resolved since stopping bike riding. Recommend annual DRE AND PSA - will see back 12/2015 for annual screening, given 1st degree fhx. To call office for recurrent prostatitis symptoms.   . Chronic respiratory failure with hypoxia ([place]) 07/29/2019  . 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 COPD (chronic obstructive pulmonary disease) ([place]) 06/12/2016  . Coronary arteriosclerosis in native artery 03/29/2017  . Coronary artery disease involving native coronary artery of native heart with angina pectoris ([place]) 03/29/2017  . Cough 10/17/2016   Last Assessment & Plan:  Discussed typical course for acute viral illness. If symptoms worsen or fail to improve by 7-10d, delayed ATBs, fluids, rest,  NSAIDs/APAP prn. Seek care if not improving. Needs earlier INR check due to ATBs.  [name] Dyspnea 02/01/2016   Last Assessment & Plan:  Overall improving, eval by pulm, plan for CT, neg stress test with cardiology. Recent switch to carvedilol due to side effects.  . Encounter for therapeutic drug monitoring 01/06/2019  . Enterococcal bacteremia   . Epidermoid cyst of skin 08/24/2017  . Essential hypertension 12/14/2015   Last Assessment & Plan:  Hypertension control: controlled   Medications: compliant Medication Management: as noted in orders Home blood pressure monitoring recommended additionally as needed for symptoms  The patient's care plan was reviewed and updated. Instructions and counseling were provided regarding patient goals and barriers. He was counseled to adopt a healthy lifestyle. Educational resources and self-management tools have been provided as charted in  Special[facility] Hospital list.   . H/O maze procedure 03/29/2017  . [name] 03/29/2017   Overview:  2011  . History of coronary artery bypass graft 03/29/2017  . Hx of CABG 03/29/2017  . Hyperlipidemia 03/29/2017  . Hypersensitivity angiitis ([place]) 10/01/2017  . Hypertensive heart disease 03/29/2017  . Hypertensive heart disease with heart failure ([place]) 03/29/2017  . Hypertensive heart failure ([place]) 03/29/2017  . Hypokalemia due to excessive renal loss of potassium 02/18/2018  . Hypotension, chronic 06/06/2018  . International normalized ratio (INR) raised 07/26/2017  . Kidney stone 07/23/2015  . Kidney stones 07/23/2015   Overview:  x 3  Last Assessment & Plan:  By [place] has left nephrolithiasis, but not visible by KUB. Will check CT renal colic next year to assess both stone burden as well as to surveil AML.   [name] Left ureteral stone 01/23/2018  . Leukocytoclastic vasculitis ([place]) 10/01/2017  . Localized edema 01/04/2018  . Long term (current) use of anticoagulants 03/29/2017  . Lumbar radicular pain 01/19/2016  . Lumbar radiculopathy 01/19/2016  . Maculopapular rash 09/03/2017  . Microscopic hematuria 06/20/2017   Last Assessment & Plan:  Had hematuria workup in [facility] in 2016 which negative CT and cystoscopy. UA with 2+ blood last visit - we discussed recommendation for repeat workup at 5 years or if degree of hematuria progresses.   . Multiple nodules of lung 06/12/2016  . Nasal discharge 02/25/2016   Last Assessment & Plan:  Trial zyrtec and flonase  . Nephrolithiasis 07/23/2015   Overview:  x 3  Last Assessment & Plan:   By [place] has left nephrolithiasis, but not visible by KUB. Will check CT renal colic next year to assess both stone burden as well as to surveil AML.   Overview:  x 3  Last Assessment & Plan:  Has 92m nonobstructing LUP stone - not visible by KUB.  Will check renal [place] - he will contact office sooner if symptomatic.   . Non-sustained ventricular tachycardia ([place] 03/29/2017  . Nonsustained ventricular tachycardia ([place] 03/29/2017  . Other hyperlipidemia 03/29/2017  . Palpitations 10/01/2017  . Paroxysmal atrial fibrillation ([place] 03/29/2017  . Paroxysmal atrial fibrillation ([place] 03/29/2017  . Pleural effusion, bilateral 08/09/2017  . Post-nasal drainage 02/25/2016   Last Assessment & Plan:  Trial zyrtec and flonase  . Prostate cancer screening 06/20/2017   Last Assessment & Plan:  Recommend continued annual CaP screening until within 10 years of life expectancy. Given good health and fhx of longevity, would anticipate CaP screening to continue until age 74  PSA today and again in one year on day of visit.  . Pulmonary arterial hypertension ([place] 02/20/2018  . Pulmonary  edema   . Pulmonary hypertension ([place]) 08/09/2017  . Pulmonary nodules 06/12/2016  . S/P AVR 03/20/2016  . S/P AVR (aortic valve replacement) 03/20/2016  . Strain of deltoid muscle, initial encounter   . Supratherapeutic INR 07/26/2017  . Syncope 03/29/2017  . Syncope and collapse 02/01/2016  . Typical atrial flutter ([place]) 02/01/2016    Past Surgical History:  Procedure Laterality Date  . CHOLECYSTECTOMY    . CORONARY ARTERY BYPASS GRAFT    . EXPLORATORY LAPAROTOMY  07/30/2017  . FOOT SURGERY    . FRACTURE SURGERY Right    wrist and forearm  . HERNIA REPAIR    . MECHANICAL AORTIC VALVE REPLACEMENT    . NASAL SINUS SURGERY    . RIGHT HEART CATH N/A 01/18/2018   Procedure: RIGHT HEART CATH;  Surgeon: Jolaine Artist, MD;  Location: [place];  Service: Cardiovascular;  Laterality: N/A;  . RIGHT HEART CATH N/A 05/14/2018    Procedure: RIGHT HEART CATH;  Surgeon: Jolaine Artist, MD;  Location: [place] [facility];  Service: Cardiovascular;  Laterality: N/A;  . TEE WITHOUT CARDIOVERSION N/A 05/21/2018   Procedure: TRANSESOPHAGEAL ECHOCARDIOGRAM (TEE);  Surgeon: Jolaine Artist, MD;  Location: [facility] ENDOSCOPY;  Service: Cardiovascular;  Laterality: N/A;  . UPPER GASTROINTESTINAL ENDOSCOPY  07/12/2017   Patchy areas of mucosal inflammation noted in the antrum with edema,erthema and ulcerations. Bx. Chronicfocally active gastritis.  [name] VASECTOMY      Current Medications: Current Meds  Medication Sig  . acetaminophen (TYLENOL) 650 MG CR tablet Take 1 tablet (650 mg total) by mouth every 8 (eight) hours as needed for pain. Not to use with norco since combined with tylenol.  [name] albuterol (VENTOLIN HFA) 108 (90 Base) MCG/ACT inhaler Inhale 2 puffs into the lungs every 6 (six) hours as needed for wheezing or shortness of breath.  [name] aspirin EC 81 MG tablet Take 81 mg by mouth daily.  . B Complex-C (B-COMPLEX WITH VITAMIN C) tablet Take 1 tablet by mouth daily.   . Cholecalciferol (VITAMIN D) 125 MCG (5000 UT) CAPS Take 1 capsule by mouth daily.  . macitentan (OPSUMIT) 10 MG tablet Take 1 tablet (10 mg total) by mouth daily.  . Magnesium Gluconate (MAGNESIUM 27 PO) Take 1 tablet by mouth every other day.   . metolazone (ZAROXOLYN) 2.5 MG tablet Take 2.5 mg by mouth as needed.  . midodrine (PROAMATINE) 10 MG tablet Take 1 tablet (10 mg total) by mouth 3 (three) times daily with meals.  . potassium chloride SA (KLOR-CON) 20 MEQ tablet Take 2 tablets (40 mEq total) by mouth 2 (two) times daily.  . rosuvastatin (CRESTOR) 5 MG tablet TAKE 1 TABLET(5 MG) BY MOUTH DAILY  . Selexipag (UPTRAVI) 400 MCG TABS Take 400 mcg by mouth 2 (two) times daily.  [name] spironolactone (ALDACTONE) 25 MG tablet TAKE 1/2 OF A TABLET BY MOUTH ONCE DAILY.  . tadalafil, PAH, (ADCIRCA) 20 MG tablet Take 2 tablets (40 mg total) by mouth daily.  [name]  torsemide (DEMADEX) 20 MG tablet Take 60 mg daily alternating with 40 mg daily  . Vitamin D, Ergocalciferol, (DRISDOL) 1.25 MG (50000 UNIT) CAPS capsule Take 1 capsule (50,000 Units total) by mouth every 7 (seven) days.  [name] warfarin (COUMADIN) 5 MG tablet TAKE 1/2 TO 1 TABLET DAILY AS DIRECTED BY COUMADIN CLINIC     Allergies:   Protonix [pantoprazole sodium], Amoxicillin-pot clavulanate, and Tape   Social History   Socioeconomic History  . Marital status: Single  Spouse name: Not on file  . Number of children: Not on file  . Years of education: Not on file  . Highest education level: Not on file  Occupational History  . Not on file  Tobacco Use  . Smoking status: Former Smoker    Packs/day: 2.00    Years: 34.00    Pack years: 68.00    Types: Cigarettes    Quit date: 07/16/2000    Years since quitting: 20.1  . Smokeless tobacco: Never Used  Vaping Use  . Vaping Use: Never used  Substance and Sexual Activity  . Alcohol use: Not Currently  . Drug use: No  . Sexual activity: Not on file  Other Topics Concern  . Not on file  Social History Narrative  . Not on file   Social Determinants of Health   Financial Resource Strain: Low Risk   . Difficulty of Paying Living Expenses: Not hard at all  Food Insecurity: No Food Insecurity  . Worried About Charity fundraiser in the Last Year: Never true  . Ran Out of Food in the Last Year: Never true  Transportation Needs: No Transportation Needs  . Lack of Transportation (Medical): No  . Lack of Transportation (Non-Medical): No  Physical Activity:   . Days of Exercise per Week: Not on file  . Minutes of Exercise per Session: Not on file  Stress:   . Feeling of Stress : Not on file  Social Connections:   . Frequency of Communication with Friends and Family: Not on file  . Frequency of Social Gatherings with Friends and Family: Not on file  . Attends Religious Services: Not on file  . Active Member of Clubs or Organizations: Not  on file  . Attends Archivist Meetings: Not on file  . Marital Status: Not on file     Family History: The patient's family history includes Arthritis in his mother; Asthma in his mother; Heart attack in his father; Hypertension in his father; Stroke in his paternal grandmother. There is no history of Colon cancer. ROS:   Please see the history of present illness.    All 14 point review of systems negative except as described per history of present illness  EKGs/Labs/Other Studies Reviewed:      Recent Labs: 05/17/2020: Magnesium 2.6 06/21/2020: ALT 23; B Natriuretic Peptide 154.4; Hemoglobin 15.2; Platelets 254 08/09/2020: BUN 33; Creatinine, Ser 1.20; Potassium 4.1; Sodium 137  Recent Lipid Panel    Component Value Date/Time   CHOL 125 10/21/2018 1028   TRIG 57 10/21/2018 1028   HDL 43 10/21/2018 1028   CHOLHDL 2.9 10/21/2018 1028   VLDL 11 10/21/2018 1028   LDLCALC 71 10/21/2018 1028    Physical Exam:    VS:  BP 110/62 (BP Location: Right Arm, Patient Position: Sitting, Cuff Size: Normal)   Pulse (!) 56   Ht 5' 9.5" (1.765 m)   Wt 147 lb (66.7 kg)   SpO2 97%   BMI 21.40 kg/m     Wt Readings from Last 3 Encounters:  09/21/20 147 lb (66.7 kg)  09/20/20 149 lb 3.2 oz (67.7 kg)  06/21/20 146 lb (66.2 kg)     GEN:  Well nourished, well developed in no acute distress HEENT: Normal NECK: No JVD; No carotid bruits LYMPHATICS: No lymphadenopathy CARDIAC: RRR, crisp mechanical valve sounds, soft systolic murmur grade 1/6 best heard right upper portion of the sternum, no rubs, no gallops RESPIRATORY:  Clear to auscultation without rales, wheezing  or rhonchi  ABDOMEN: Soft, non-tender, non-distended MUSCULOSKELETAL:  No edema; No deformity  SKIN: Warm and dry LOWER EXTREMITIES: no swelling NEUROLOGIC:  Alert and oriented x 3 PSYCHIATRIC:  Normal affect   ASSESSMENT:    1. S/P AVR   2. Typical atrial flutter ([place])   3. Pulmonary hypertension ([place])   4.  Coronary artery disease involving native coronary artery of native heart with angina pectoris ([place])   5. History of coronary artery bypass graft    PLAN:    In order of problems listed above:  1. Episodic valve replacement last echocardiogram done in March showed normal parameters which I will continue monitoring.  He is taking Coumadin with therapeutic INR.  Today he is can have another INR drawn. 2. Typical atrial flutter, a last EKG reviewed to look more like an atypical atrial flutter, versus ectopic atrial tachycardia.  Rate control doing well anticoagulated will continue. 3. Pulmonary hypertension: Followed by pulmonary clinic.  Doing well from that point review. 4. Coronary disease stable asymptomatic continue present management. 5. Dyslipidemia: On statin which is small dose.  I will ask you to have fasting lipid profile done today.   Medication Adjustments/Labs and Tests Ordered: Current medicines are reviewed at length with the patient today.  Concerns regarding medicines are outlined above.  No orders of the defined types were placed in this encounter.  Medication changes: No orders of the defined types were placed in this encounter.   Signed, Park Liter, MD, [facility] 09/21/2020 1:41 PM    [place]

## 2020-09-21 NOTE — Patient Instructions (Signed)
Continue taking 1/2 tablet daily except 1 tablet on Sunday and  Wednesdays.  Please call our office with any medication changes or concerns [phone]. Return in 6 weeks for INR check.

## 2020-09-23 ENCOUNTER — Other Ambulatory Visit (HOSPITAL_COMMUNITY): Payer: Self-pay | Admitting: *Deleted

## 2020-09-23 ENCOUNTER — Other Ambulatory Visit (HOSPITAL_COMMUNITY): Payer: Self-pay

## 2020-09-23 MED ORDER — UPTRAVI 400 MCG PO TABS: 400.0000 ug | ORAL_TABLET | Freq: Two times a day (BID) | ORAL | 4 refills | Status: DC

## 2020-09-28 ENCOUNTER — Telehealth (INDEPENDENT_AMBULATORY_CARE_PROVIDER_SITE_OTHER): Payer: Medicare Other | Admitting: Family Medicine

## 2020-09-28 ENCOUNTER — Telehealth: Payer: Self-pay | Admitting: Medical

## 2020-09-28 ENCOUNTER — Encounter: Payer: Self-pay | Admitting: Family Medicine

## 2020-09-28 ENCOUNTER — Other Ambulatory Visit: Payer: Self-pay

## 2020-09-28 VITALS — BP 112/71 | Temp 97.4°F | Ht 69.5 in | Wt 146.0 lb

## 2020-09-28 DIAGNOSIS — R059 Cough, unspecified: Secondary | ICD-10-CM

## 2020-09-28 MED ORDER — PREDNISONE 20 MG PO TABS: 40.0000 mg | ORAL_TABLET | Freq: Every day | ORAL | 0 refills | Status: AC

## 2020-09-28 NOTE — Progress Notes (Signed)
Chief Complaint  Patient presents with  . Cough    Lance Hicks here for URI complaints. Due to COVID-19 pandemic, we are interacting via telephone. I verified patient's ID using 2 identifiers. Patient agreed to proceed with visit via this method. Patient is at home, I am at office. Patient and I are present for visit.    Duration: 8 days  Associated symptoms: rhinorrhea and cough Denies: sinus congestion, sinus pain, itchy watery eyes, ear pain, ear drainage, sore throat, wheezing, shortness of breath, myalgia and fevers Treatment to date: Mucinex, Vick's, Zyrtec Sick contacts: No  Past Medical History:  Diagnosis Date  . Allergic rhinitis 04/28/2016   Last Assessment & Plan:  Continue astelin  . Anemia 07/01/2019  . 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 In light of concurrent left nephrolithiasis, will check CT renal colic next year instead of renal US.   [name] Anticoagulated on Coumadin 01/04/2018  . Anxiety 05/10/2016   Last Assessment & Plan:  Doing well off of zoloft.  . Asthma   . Asymptomatic microscopic hematuria 06/20/2017   Last Assessment & Plan:  Had hematuria workup in [facility] in 2016 which negative CT and cystoscopy. UA with 2+ blood last visit - we discussed recommendation for repeat workup at 5 years or if degree of hematuria progresses.   . Atrial fibrillation ([place]) 03/29/2017  . Atrial flutter ([place]) 03/29/2017  . Backache 12/14/2015   Last Assessment & Plan:  Pain management referral for further evaluation.  . Bilateral pleural effusion 08/09/2017  . Chronic allergic rhinitis 04/28/2016   Last Assessment & Plan:  Continue astelin  . Chronic anticoagulation 03/29/2017  . Chronic atrial fibrillation ([place]) 07/23/2015   Last Assessment & Plan:  Coumadin and metoprolol, cardiology referral to establish care.  . Chronic midline back pain 12/14/2015   Last Assessment & Plan:  Pain management referral for further evaluation.   . Chronic obstructive lung disease ([place]) 06/12/2016   With hypoxia  . Chronic prostatitis 07/23/2015   Last Assessment & Plan:  Has largely resolved since stopping bike riding. Recommend annual DRE AND PSA - will see back 12/2015 for annual screening, given 1st degree fhx. To call office for recurrent prostatitis symptoms.   . Chronic respiratory failure with hypoxia ([place]) 07/29/2019  . Cirrhosis of liver not due to alcohol ([place]) 07/01/2019  . COPD (chronic obstructive pulmonary disease) ([place]) 06/12/2016  . Coronary arteriosclerosis in native artery 03/29/2017  . Coronary artery disease involving native coronary artery of native heart with angina pectoris ([place]) 03/29/2017  . Cough 10/17/2016   Last Assessment & Plan:  Discussed typical course for acute viral illness. If symptoms worsen or fail to improve by 7-10d, delayed ATBs, fluids, rest, NSAIDs/APAP prn. Seek care if not improving. Needs earlier INR check due to ATBs.  [name] Dyspnea 02/01/2016   Last Assessment & Plan:  Overall improving, eval by pulm, plan for CT, neg stress test with cardiology. Recent switch to carvedilol due to side effects.  . Encounter for therapeutic drug monitoring 01/06/2019  . Enterococcal bacteremia   . Epidermoid cyst of skin 08/24/2017  . Essential hypertension 12/14/2015   Last Assessment & Plan:  Hypertension control: controlled  Medications: compliant Medication Management: as noted in orders Home blood pressure monitoring recommended additionally as needed for symptoms  The patient's care plan was reviewed and updated. Instructions and counseling were provided regarding patient goals and barriers. He was counseled to  adopt a healthy lifestyle. Educational resources and self-management tools have been provided as charted in [facility] list.   . H/O maze procedure 03/29/2017  . [name] 03/29/2017   Overview:  2011  . History of coronary artery bypass graft 03/29/2017  . Hx of CABG 03/29/2017  . Hyperlipidemia  03/29/2017  . Hypersensitivity angiitis ([place]) 10/01/2017  . Hypertensive heart disease 03/29/2017  . Hypertensive heart disease with heart failure ([place]) 03/29/2017  . Hypertensive heart failure ([place]) 03/29/2017  . Hypokalemia due to excessive renal loss of potassium 02/18/2018  . Hypotension, chronic 06/06/2018  . International normalized ratio (INR) raised 07/26/2017  . Kidney stone 07/23/2015  . Kidney stones 07/23/2015   Overview:  x 3  Last Assessment & Plan:  By [place] has left nephrolithiasis, but not visible by KUB. Will check CT renal colic next year to assess both stone burden as well as to surveil AML.   [name] Left ureteral stone 01/23/2018  . Leukocytoclastic vasculitis ([place]) 10/01/2017  . Localized edema 01/04/2018  . Long term (current) use of anticoagulants 03/29/2017  . Lumbar radicular pain 01/19/2016  . Lumbar radiculopathy 01/19/2016  . Maculopapular rash 09/03/2017  . Microscopic hematuria 06/20/2017   Last Assessment & Plan:  Had hematuria workup in [facility] in 2016 which negative CT and cystoscopy. UA with 2+ blood last visit - we discussed recommendation for repeat workup at 5 years or if degree of hematuria progresses.   . Multiple nodules of lung 06/12/2016  . Nasal discharge 02/25/2016   Last Assessment & Plan:  Trial zyrtec and flonase  . Nephrolithiasis 07/23/2015   Overview:  x 3  Last Assessment & Plan:  By [place] has left nephrolithiasis, but not visible by KUB. Will check CT renal colic next year to assess both stone burden as well as to surveil AML.   Overview:  x 3  Last Assessment & Plan:  Has 8m nonobstructing LUP stone - not visible by KUB.  Will check renal [place]/2019 - he will contact office sooner if symptomatic.   . Non-sustained ventricular tachycardia ([place] 03/29/2017  . Nonsustained ventricular tachycardia ([place] 03/29/2017  . Other hyperlipidemia 03/29/2017  . Palpitations 10/01/2017  . Paroxysmal atrial fibrillation ([place] 03/29/2017  . Paroxysmal atrial fibrillation ([place] 03/29/2017  . Pleural  effusion, bilateral 08/09/2017  . Post-nasal drainage 02/25/2016   Last Assessment & Plan:  Trial zyrtec and flonase  . Prostate cancer screening 06/20/2017   Last Assessment & Plan:  Recommend continued annual CaP screening until within 10 years of life expectancy. Given good health and fhx of longevity, would anticipate CaP screening to continue until age 74  PSA today and again in one year on day of visit.  . Pulmonary arterial hypertension ([place] 02/20/2018  . Pulmonary edema   . Pulmonary hypertension ([place] 08/09/2017  . Pulmonary nodules 06/12/2016  . S/P AVR 03/20/2016  . S/P AVR (aortic valve replacement) 03/20/2016  . Strain of deltoid muscle, initial encounter   . Supratherapeutic INR 07/26/2017  . Syncope 03/29/2017  . Syncope and collapse 02/01/2016  . Typical atrial flutter ([facility]) 02/01/2016    BP 112/71 (BP Location: Left Arm, Patient Position: Sitting, Cuff Size: Normal)   Temp (!) 97.4 F (36.3 C) (Oral)   Ht 5' 9.5" (1.765 m)   Wt 146 lb (66.2 kg)   SpO2 97%   BMI 21.25 kg/m  No conversational dyspnea Age appropriate judgment and insight Nml affect and mood  Cough - Plan: predniSONE (DELTASONE)  20 MG tablet  Pred burst 40 mg/d for 5 d to help with coughing. Cont Zyrtec. F/u in person if no improvement. Total time: 11 min Pt voiced understanding and agreement to the plan.  [place], DO 09/28/20 3:18 PM

## 2020-09-28 NOTE — Telephone Encounter (Signed)
CallerTelvin Hicks  Call Back # [phone]  Patient states he has a cold and cant get shake it. Patient is requesting that [name] PA sends in a medication to pharmacy.  Please advise

## 2020-09-28 NOTE — Telephone Encounter (Signed)
Scheduled pt for VV with Dr.Wendling

## 2020-10-11 ENCOUNTER — Telehealth (HOSPITAL_COMMUNITY): Payer: Self-pay | Admitting: Pharmacist

## 2020-10-11 MED ORDER — MIDODRINE HCL 10 MG PO TABS: 10.0000 mg | ORAL_TABLET | Freq: Three times a day (TID) | ORAL | 11 refills | Status: DC

## 2020-10-11 NOTE — Telephone Encounter (Signed)
Refill of midodrine sent to [facility].  Audry Riles, PharmD, BCPS, BCCP, CPP Heart Failure Clinic Pharmacist [phone]

## 2020-10-12 ENCOUNTER — Ambulatory Visit (INDEPENDENT_AMBULATORY_CARE_PROVIDER_SITE_OTHER): Payer: Medicare Other | Admitting: Medical

## 2020-10-12 ENCOUNTER — Other Ambulatory Visit: Payer: Self-pay

## 2020-10-12 VITALS — BP 125/64 | HR 68 | Temp 98.0°F | Resp 18 | Ht 69.0 in | Wt 143.0 lb

## 2020-10-12 DIAGNOSIS — H6981 Other specified disorders of Eustachian tube, right ear: Secondary | ICD-10-CM | POA: Diagnosis not present

## 2020-10-12 DIAGNOSIS — H6690 Otitis media, unspecified, unspecified ear: Secondary | ICD-10-CM

## 2020-10-12 DIAGNOSIS — E559 Vitamin D deficiency, unspecified: Secondary | ICD-10-CM | POA: Diagnosis not present

## 2020-10-12 DIAGNOSIS — R7989 Other specified abnormal findings of blood chemistry: Secondary | ICD-10-CM

## 2020-10-12 DIAGNOSIS — E782 Mixed hyperlipidemia: Secondary | ICD-10-CM

## 2020-10-12 DIAGNOSIS — I2510 Atherosclerotic heart disease of native coronary artery without angina pectoris: Secondary | ICD-10-CM | POA: Diagnosis not present

## 2020-10-12 MED ORDER — FLUTICASONE PROPIONATE 50 MCG/ACT NA SUSP
2.0000 | Freq: Every day | NASAL | 1 refills | Status: DC
Start: 2020-10-12 — End: 2021-03-31

## 2020-10-12 MED ORDER — CEPHALEXIN 500 MG PO CAPS
500.0000 mg | ORAL_CAPSULE | Freq: Two times a day (BID) | ORAL | 0 refills | Status: DC
Start: 2020-10-12 — End: 2020-10-28

## 2020-10-12 NOTE — Progress Notes (Signed)
Subjective:    Patient ID: Lance Hicks, male    DOB: Aug 26, 1946, 74 y.o.   MRN: [id number]  HPI  Pt in with feeling of his rt ear feeling plugged.   Pt states after flu vaccine he felt like he had viral syndrome. That was on 09/20/2020. He eventually recovered.   Pt has gotten covid vaccine. Has got booster already.   Hx of hyperlipidemia. Needs lipid panel.  Also he needs vit D level checked. Hx of low vit d in past.      Review of Systems  Constitutional: Negative for chills, fatigue and fever.  Respiratory: Negative for cough, chest tightness, shortness of breath and wheezing.   Cardiovascular: Negative for chest pain and palpitations.  Gastrointestinal: Negative for abdominal pain, constipation and nausea.  Genitourinary: Negative for dysuria and frequency.  Musculoskeletal: Negative for back pain.  Skin: Negative for rash.  Neurological: Negative for facial asymmetry and light-headedness.  Hematological: Negative for adenopathy. Does not bruise/bleed easily.  Psychiatric/Behavioral: Negative for behavioral problems, dysphoric mood and suicidal ideas. The patient is not nervous/anxious.     Past Medical History:  Diagnosis Date  . Allergic rhinitis 04/28/2016   Last Assessment & Plan:  Continue astelin  . Anemia 07/01/2019  . 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 In light of concurrent left nephrolithiasis, will check CT renal colic next year instead of renal US.   [name] Anticoagulated on Coumadin 01/04/2018  . Anxiety 05/10/2016   Last Assessment & Plan:  Doing well off of zoloft.  . Asthma   . Asymptomatic microscopic hematuria 06/20/2017   Last Assessment & Plan:  [facility] in [facility] which negative CT and cystoscopy. UA with 2+ blood last visit - we discussed recommendation for repeat workup at 5 years or if degree of hematuria progresses.   . Atrial fibrillation ([place]) 03/29/2017  . Atrial  flutter ([place]) 03/29/2017  . Backache 12/14/2015   Last Assessment & Plan:  Pain management referral for further evaluation.  . Bilateral pleural effusion 08/09/2017  . Chronic allergic rhinitis 04/28/2016   Last Assessment & Plan:  Continue astelin  . Chronic anticoagulation 03/29/2017  . Chronic atrial fibrillation ([place]) 07/23/2015   Last Assessment & Plan:  Coumadin and metoprolol, cardiology referral to establish care.  . Chronic midline back pain 12/14/2015   Last Assessment & Plan:  Pain management referral for further evaluation.  . Chronic obstructive lung disease ([place]) 06/12/2016   With hypoxia  . Chronic prostatitis 07/23/2015   Last Assessment & Plan:  Has largely resolved since stopping bike riding. Recommend annual DRE AND PSA - will see back 12/2015 for annual screening, given 1st degree fhx. To call office for recurrent prostatitis symptoms.   . Chronic respiratory failure with hypoxia ([place]) 07/29/2019  . Cirrhosis of liver not due to alcohol ([place]) 07/01/2019  . COPD (chronic obstructive pulmonary disease) ([place]) 06/12/2016  . Coronary arteriosclerosis in native artery 03/29/2017  . Coronary artery disease involving native coronary artery of native heart with angina pectoris ([place]) 03/29/2017  . Cough 10/17/2016   Last Assessment & Plan:  Discussed typical course for acute viral illness. If symptoms worsen or fail to improve by 7-10d, delayed ATBs, fluids, rest, NSAIDs/APAP prn. Seek care if not improving. Needs earlier INR check due to ATBs.  [name] Dyspnea 02/01/2016   Last Assessment & Plan:  Overall improving, eval by pulm, plan for CT, neg  stress test with cardiology. Recent switch to carvedilol due to side effects.  . Encounter for therapeutic drug monitoring 01/06/2019  . Enterococcal bacteremia   . Epidermoid cyst of skin 08/24/2017  . Essential hypertension 12/14/2015   Last Assessment & Plan:  Hypertension control: controlled  Medications: compliant Medication Management: as noted in orders Home  blood pressure monitoring recommended additionally as needed for symptoms  The patient's care plan was reviewed and updated. Instructions and counseling were provided regarding patient goals and barriers. He was counseled to adopt a healthy lifestyle. Educational resources and self-management tools have been provided as charted in [facility] list.   . H/O maze procedure 03/29/2017  . [name] 03/29/2017   Overview:  2011  . History of coronary artery bypass graft 03/29/2017  . Hx of CABG 03/29/2017  . Hyperlipidemia 03/29/2017  . Hypersensitivity angiitis ([place]) 10/01/2017  . Hypertensive heart disease 03/29/2017  . Hypertensive heart disease with heart failure ([place]) 03/29/2017  . Hypertensive heart failure ([place]) 03/29/2017  . Hypokalemia due to excessive renal loss of potassium 02/18/2018  . Hypotension, chronic 06/06/2018  . International normalized ratio (INR) raised 07/26/2017  . Kidney stone 07/23/2015  . Kidney stones 07/23/2015   Overview:  x 3  Last Assessment & Plan:  By [place] has left nephrolithiasis, but not visible by KUB. Will check CT renal colic next year to assess both stone burden as well as to surveil AML.   [name] Left ureteral stone 01/23/2018  . Leukocytoclastic vasculitis ([place]) 10/01/2017  . Localized edema 01/04/2018  . Long term (current) use of anticoagulants 03/29/2017  . Lumbar radicular pain 01/19/2016  . Lumbar radiculopathy 01/19/2016  . Maculopapular rash 09/03/2017  . Microscopic hematuria 06/20/2017   Last Assessment & Plan:  Had hematuria workup in [facility] in 2016 which negative CT and cystoscopy. UA with 2+ blood last visit - we discussed recommendation for repeat workup at 5 years or if degree of hematuria progresses.   . Multiple nodules of lung 06/12/2016  . Nasal discharge 02/25/2016   Last Assessment & Plan:  Trial zyrtec and flonase  . Nephrolithiasis 07/23/2015   Overview:  x 3  Last Assessment & Plan:  By [place] has left nephrolithiasis, but not visible by KUB. Will check CT  renal colic next year to assess both stone burden as well as to surveil AML.   Overview:  x 3  Last Assessment & Plan:  Has 37m nonobstructing LUP stone - not visible by KUB.  Will check renal [place]2019 - he will contact office sooner if symptomatic.   . Non-sustained ventricular tachycardia ([place] 03/29/2017  . Nonsustained ventricular tachycardia ([place] 03/29/2017  . Other hyperlipidemia 03/29/2017  . Palpitations 10/01/2017  . Paroxysmal atrial fibrillation ([facility] 03/29/2017  . Paroxysmal atrial fibrillation ([place] 03/29/2017  . Pleural effusion, bilateral 08/09/2017  . Post-nasal drainage 02/25/2016   Last Assessment & Plan:  Trial zyrtec and flonase  . Prostate cancer screening 06/20/2017   Last Assessment & Plan:  Recommend continued annual CaP screening until within 10 years of life expectancy. Given good health and fhx of longevity, would anticipate CaP screening to continue until age 74  PSA today and again in one year on day of visit.  . Pulmonary arterial hypertension ([place] 02/20/2018  . Pulmonary edema   . Pulmonary hypertension ([place] 08/09/2017  . Pulmonary nodules 06/12/2016  . S/P AVR 03/20/2016  . S/P AVR (aortic valve replacement) 03/20/2016  . Strain of deltoid muscle, initial encounter   .  Supratherapeutic INR 07/26/2017  . Syncope 03/29/2017  . Syncope and collapse 02/01/2016  . Typical atrial flutter ([place]) 02/01/2016     Social History   Socioeconomic History  . Marital status: Single    Spouse name: Not on file  . Number of children: Not on file  . Years of education: Not on file  . Highest education level: Not on file  Occupational History  . Not on file  Tobacco Use  . Smoking status: Former Smoker    Packs/day: 2.00    Years: 34.00    Pack years: 68.00    Types: Cigarettes    Quit date: 07/16/2000    Years since quitting: 20.2  . Smokeless tobacco: Never Used  Vaping Use  . Vaping Use: Never used  Substance and Sexual Activity  . Alcohol use: Not Currently  . Drug use: No  .  Sexual activity: Not on file  Other Topics Concern  . Not on file  Social History Narrative  . Not on file   Social Determinants of Health   Financial Resource Strain: Low Risk   . Difficulty of Paying Living Expenses: Not hard at all  Food Insecurity: No Food Insecurity  . Worried About Charity fundraiser in the Last Year: Never true  . Ran Out of Food in the Last Year: Never true  Transportation Needs: No Transportation Needs  . Lack of Transportation (Medical): No  . Lack of Transportation (Non-Medical): No  Physical Activity:   . Days of Exercise per Week: Not on file  . Minutes of Exercise per Session: Not on file  Stress:   . Feeling of Stress : Not on file  Social Connections:   . Frequency of Communication with Friends and Family: Not on file  . Frequency of Social Gatherings with Friends and Family: Not on file  . Attends Religious Services: Not on file  . Active Member of Clubs or Organizations: Not on file  . Attends Archivist Meetings: Not on file  . Marital Status: Not on file  Intimate Partner Violence:   . Fear of Current or Ex-Partner: Not on file  . Emotionally Abused: Not on file  . Physically Abused: Not on file  . Sexually Abused: Not on file    Past Surgical History:  Procedure Laterality Date  . CHOLECYSTECTOMY    . CORONARY ARTERY BYPASS GRAFT    . EXPLORATORY LAPAROTOMY  07/30/2017  . FOOT SURGERY    . FRACTURE SURGERY Right    wrist and forearm  . HERNIA REPAIR    . MECHANICAL AORTIC VALVE REPLACEMENT    . NASAL SINUS SURGERY    . RIGHT HEART CATH N/A 01/18/2018   Procedure: RIGHT HEART CATH;  Surgeon: Jolaine Artist, MD;  Location: [facility];  Service: Cardiovascular;  Laterality: N/A;  . RIGHT HEART CATH N/A 05/14/2018   Procedure: RIGHT HEART CATH;  Surgeon: Jolaine Artist, MD;  Location: [facility];  Service: Cardiovascular;  Laterality: N/A;  . TEE WITHOUT CARDIOVERSION N/A 05/21/2018   Procedure:  TRANSESOPHAGEAL ECHOCARDIOGRAM (TEE);  Surgeon: Jolaine Artist, MD;  Location: [facility]COPY;  Service: Cardiovascular;  Laterality: N/A;  . UPPER GASTROINTESTINAL ENDOSCOPY  07/12/2017   Patchy areas of mucosal inflammation noted in the antrum with edema,erthema and ulcerations. Bx. Chronicfocally active gastritis.  [name] VASECTOMY      Family History  Problem Relation Age of Onset  . Asthma Mother   . Arthritis Mother   .  Heart attack Father   . Hypertension Father   . Stroke Paternal Grandmother   . Colon cancer Neg Hx     Allergies  Allergen Reactions  . Protonix [Pantoprazole Sodium]     Gets gassy and starting itching like crazy  . Amoxicillin-Pot Clavulanate Diarrhea    Stomach pain Has patient had a PCN reaction causing immediate rash, facial/tongue/throat swelling, SOB or lightheadedness with hypotension: No Has patient had a PCN reaction causing severe rash involving mucus membranes or skin necrosis: No Has patient had a PCN reaction that required hospitalization: No Has patient had a PCN reaction occurring within the last 10 years: Yes If all of the above answers are "NO", then may proceed with Cephalosporin use.   . Tape Rash and Other (See Comments)    Surgical tape    Current Outpatient Medications on File Prior to Visit  Medication Sig Dispense Refill  . acetaminophen (TYLENOL) 650 MG CR tablet Take 1 tablet (650 mg total) by mouth every 8 (eight) hours as needed for pain. Not to use with norco since combined with tylenol. 30 tablet 0  . albuterol (VENTOLIN HFA) 108 (90 Base) MCG/ACT inhaler Inhale 2 puffs into the lungs every 6 (six) hours as needed for wheezing or shortness of breath. 8 g 6  . aspirin EC 81 MG tablet Take 81 mg by mouth daily.    . B Complex-C (B-COMPLEX WITH VITAMIN C) tablet Take 1 tablet by mouth daily.     . Cholecalciferol (VITAMIN D) 125 MCG (5000 UT) CAPS Take 1 capsule by mouth daily.    . macitentan (OPSUMIT) 10 MG tablet Take 1 tablet  (10 mg total) by mouth daily. 30 tablet 0  . Magnesium Gluconate (MAGNESIUM 27 PO) Take 1 tablet by mouth every other day.     . metolazone (ZAROXOLYN) 2.5 MG tablet Take 2.5 mg by mouth as needed.    . midodrine (PROAMATINE) 10 MG tablet Take 1 tablet (10 mg total) by mouth 3 (three) times daily with meals. 90 tablet 11  . potassium chloride SA (KLOR-CON) 20 MEQ tablet Take 2 tablets (40 mEq total) by mouth 2 (two) times daily. 120 tablet 3  . rosuvastatin (CRESTOR) 5 MG tablet TAKE 1 TABLET(5 MG) BY MOUTH DAILY 90 tablet 3  . Selexipag (UPTRAVI) 400 MCG TABS Take 400 mcg by mouth 2 (two) times daily. 60 tablet 4  . spironolactone (ALDACTONE) 25 MG tablet TAKE 1/2 OF A TABLET BY MOUTH ONCE DAILY. 45 tablet 3  . tadalafil, PAH, (ADCIRCA) 20 MG tablet Take 2 tablets (40 mg total) by mouth daily. 60 tablet 11  . torsemide (DEMADEX) 20 MG tablet Take 60 mg daily alternating with 40 mg daily 270 tablet 3  . Vitamin D, Ergocalciferol, (DRISDOL) 1.25 MG (50000 UNIT) CAPS capsule Take 1 capsule (50,000 Units total) by mouth every 7 (seven) days. 7 capsule 0  . warfarin (COUMADIN) 5 MG tablet TAKE 1/2 TO 1 TABLET DAILY AS DIRECTED BY COUMADIN CLINIC 30 tablet 3   No current facility-administered medications on file prior to visit.    BP 125/64   Pulse 68   Temp 98 F (36.7 C) (Oral)   Resp 18   Ht _0  (1.753 m)   Wt 143 lb (64.9 kg)   SpO2 96%   BMI 21.12 kg/m       Objective:   Physical Exam  General Mental Status- Alert. General Appearance- Not in acute distress.   Skin General: Color- Normal  Color. Moisture- Normal Moisture.  Neck Carotid Arteries- Normal color. Moisture- Normal Moisture. No carotid bruits. No JVD.  Chest and Lung Exam Auscultation: Breath Sounds:-Normal.  Cardiovascular Auscultation:Rythm- Regular. Murmurs & Other Heart Sounds:Auscultation of the heart reveals- No Murmurs.  Abdomen Inspection:-Inspeection Normal. Palpation/Percussion:Note:No mass.  Palpation and Percussion of the abdomen reveal- Non Tender, Non Distended + BS, no rebound or guarding.   Neurologic Cranial Nerve exam:- CN III-XII intact(No nystagmus), symmetric smile. Strength:- 5/5 equal and symmetric strength both upper and lower extremities.  heent- boggy turbinates. No sinus pressure. Rt ear canal clear. No wax. Rt tm mild pinkish red.      Assessment & Plan:  For recent eustachian tube pressure and ear infection, will rx flonase nasal spray and azithromycin.  For low vit D level will get vit D level today. Continue vit D otc.  For high cholesterol will get cmp and lipid panel. Continue crestor.  Follow up 10-14 days or as needed

## 2020-10-12 NOTE — Patient Instructions (Addendum)
For recent eustachian tube pressure and ear infection, will rx flonase nasal spray and azithromycin.  For low vit D level will get vit D level today. Continue vit D otc.  For high cholesterol will get cmp and lipid panel. Continue crestor.  Follow up 10-14 days or as needed

## 2020-10-15 LAB — COMPREHENSIVE METABOLIC PANEL
AG Ratio: 1.3 (calc) (ref 1.0–2.5)
ALT: 17 U/L (ref 9–46)
AST: 30 U/L (ref 10–35)
Albumin: 4.5 g/dL (ref 3.6–5.1)
Alkaline phosphatase (APISO): 65 U/L (ref 35–144)
BUN/Creatinine Ratio: 34 (calc) — ABNORMAL HIGH (ref 6–22)
BUN: 46 mg/dL — ABNORMAL HIGH (ref 7–25)
CO2: 29 mmol/L (ref 20–32)
Calcium: 10.6 mg/dL — ABNORMAL HIGH (ref 8.6–10.3)
Chloride: 93 mmol/L — ABNORMAL LOW (ref 98–110)
Creat: 1.35 mg/dL — ABNORMAL HIGH (ref 0.70–1.18)
Globulin: 3.6 g/dL (calc) (ref 1.9–3.7)
Glucose, Bld: 97 mg/dL (ref 65–99)
Potassium: 4.2 mmol/L (ref 3.5–5.3)
Sodium: 133 mmol/L — ABNORMAL LOW (ref 135–146)
Total Bilirubin: 1.5 mg/dL — ABNORMAL HIGH (ref 0.2–1.2)
Total Protein: 8.1 g/dL (ref 6.1–8.1)

## 2020-10-15 LAB — VITAMIN D 1,25 DIHYDROXY
Vitamin D 1, 25 (OH)2 Total: 16 pg/mL — ABNORMAL LOW (ref 18–72)
Vitamin D2 1, 25 (OH)2: <8 pg/mL
Vitamin D3 1, 25 (OH)2: 16 pg/mL

## 2020-10-15 LAB — LIPID PANEL
Cholesterol: 133 mg/dL (ref <200)
HDL: 44 mg/dL (ref >=40)
LDL Cholesterol (Calc): 70 mg/dL (calc)
Non-HDL Cholesterol (Calc): 89 mg/dL (calc) (ref <130)
Total CHOL/HDL Ratio: 3 (calc) (ref <5.0)
Triglycerides: 111 mg/dL (ref <150)

## 2020-10-16 ENCOUNTER — Telehealth: Payer: Self-pay | Admitting: Medical

## 2020-10-16 MED ORDER — VITAMIN D (ERGOCALCIFEROL) 1.25 MG (50000 UNIT) PO CAPS: 50000.0000 [IU] | ORAL_CAPSULE | ORAL | 0 refills | Status: DC

## 2020-10-16 NOTE — Telephone Encounter (Signed)
Rx vit D sent to pt pharmacy.

## 2020-10-18 ENCOUNTER — Telehealth: Payer: Self-pay | Admitting: Medical

## 2020-10-18 MED ORDER — AZELASTINE HCL 0.1 % NA SOLN: 2.0000 | Freq: Two times a day (BID) | NASAL | 12 refills | Status: DC

## 2020-10-18 NOTE — Telephone Encounter (Signed)
Rx astelin nasal spray sent to pt pharmacy.

## 2020-10-18 NOTE — Telephone Encounter (Signed)
CallerSanel Hicks  Call Back # [phone]  Patient states that the medication prescribed to him is not helping his ear drain, Patient requesting another medication his sinuses and ear .   Please Advise

## 2020-10-18 NOTE — Telephone Encounter (Signed)
Rx astelin added. Continue flonase. If this is not adequate then may need to refer you to ent. Tapered medrol is option but side effects associated with medrol/prednisone. So want to try add on spray first. Referral to ENT also is option.

## 2020-10-19 ENCOUNTER — Other Ambulatory Visit (HOSPITAL_COMMUNITY): Payer: Self-pay

## 2020-10-19 MED ORDER — UPTRAVI 400 MCG PO TABS: 400.0000 ug | ORAL_TABLET | Freq: Two times a day (BID) | ORAL | 4 refills | Status: DC

## 2020-10-19 NOTE — Telephone Encounter (Signed)
Pt notified 

## 2020-10-23 ENCOUNTER — Other Ambulatory Visit (HOSPITAL_COMMUNITY): Payer: Self-pay | Admitting: Internal Medicine

## 2020-10-26 ENCOUNTER — Ambulatory Visit (HOSPITAL_BASED_OUTPATIENT_CLINIC_OR_DEPARTMENT_OTHER)
Admission: RE | Admit: 2020-10-26 | Discharge: 2020-10-26 | Disposition: A | Discharge status: Home / Self Care | Payer: Medicare Other | Source: Ambulatory Visit | Attending: Medical | Admitting: Medical

## 2020-10-26 ENCOUNTER — Ambulatory Visit (INDEPENDENT_AMBULATORY_CARE_PROVIDER_SITE_OTHER): Payer: Medicare Other | Admitting: Medical

## 2020-10-26 ENCOUNTER — Other Ambulatory Visit (HOSPITAL_COMMUNITY): Payer: Self-pay | Admitting: Internal Medicine

## 2020-10-26 ENCOUNTER — Other Ambulatory Visit: Payer: Self-pay

## 2020-10-26 VITALS — BP 123/76 | HR 111 | Resp 18 | Ht 69.0 in | Wt 151.2 lb

## 2020-10-26 DIAGNOSIS — I2510 Atherosclerotic heart disease of native coronary artery without angina pectoris: Secondary | ICD-10-CM

## 2020-10-26 DIAGNOSIS — H6991 Unspecified Eustachian tube disorder, right ear: Secondary | ICD-10-CM

## 2020-10-26 DIAGNOSIS — R221 Localized swelling, mass and lump, neck: Secondary | ICD-10-CM | POA: Diagnosis not present

## 2020-10-26 DIAGNOSIS — H6981 Other specified disorders of Eustachian tube, right ear: Secondary | ICD-10-CM | POA: Diagnosis not present

## 2020-10-26 DIAGNOSIS — R591 Generalized enlarged lymph nodes: Secondary | ICD-10-CM | POA: Diagnosis not present

## 2020-10-26 DIAGNOSIS — H9201 Otalgia, right ear: Secondary | ICD-10-CM | POA: Diagnosis not present

## 2020-10-26 DIAGNOSIS — R7989 Other specified abnormal findings of blood chemistry: Secondary | ICD-10-CM | POA: Diagnosis not present

## 2020-10-26 DIAGNOSIS — M542 Cervicalgia: Secondary | ICD-10-CM | POA: Diagnosis not present

## 2020-10-26 MED ORDER — CEFTRIAXONE SODIUM 1 G IJ SOLR
1.0000 g | Freq: Once | INTRAMUSCULAR | Status: AC
  Administered 2020-10-26: 1 g via INTRAMUSCULAR

## 2020-10-26 MED ORDER — METHYLPREDNISOLONE 4 MG PO TABS
ORAL_TABLET | ORAL | 0 refills | Status: DC
Start: 2020-10-26 — End: 2020-11-08

## 2020-10-26 NOTE — Patient Instructions (Signed)
Rt ear pain and eustachian tube dysfunction. Rocephin 1 gram im. Also add 3 day taper medrol taper. Continue nasal sprays flonase and astelin. May add oral antibiotic but want to review [place] first.  Refer to ENT. Evaluate discolored buccal mucosa and neck area.  May need to see dentist sooner if pain on rt side neck persists.  For rt side lymphadenopathy get Korea of neck and cbc.  Hx of low na and decreased cr get repeat cmp.  Folllow up 10 days or as needed

## 2020-10-26 NOTE — Addendum Note (Signed)
Addended by: Jeronimo Greaves on: 10/26/2020 11:07 AM   Modules accepted: Orders

## 2020-10-26 NOTE — Progress Notes (Signed)
ADVANCED HF CLINIC NOTE   Date:  10/26/2020   ID:  Lance Hicks, DOB 1946/12/19, MRN [id number]  PCP:  [name], PA-C  Cardiologist:  Dr. 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 Also has COPD (quit smoking in 2002 - 1 ppd x 25 years), HL, and HTN.  In 8/18 admitted for acute pancreatitis. Underwent lap cholecystectomy on 07/29/2017. On 8/6 hedeveloped hypotension and was taken back for open laparotomy due to bleeding from an unclear omental source.He was given fluids and 8 units PRBC perioperatively. He says his breathing has been bad since that time.   Seen at [facility] in 2018 had hi-res CT, PFTs and VQ (low prob). Felt to have Golds I COPD with asthma overlap and early pulmonary fibrosis with PAH. Thought felt pulmonary pressures may have been elevated due to recent illness. Referred to Rheumatology though appt not until May. Suggested possible RHC.   He was also seen by Cardiology. Echocardiogram 8/18 as below showed normal LV function with septal flattening and RVSP 65-50mHG. There was no comment on RV size or function. 14-day event monitor showed 10-beat run NSVT. Felt to have chronic AF and PAH. No further testing ordered.  Underwent RHC on 01/18/18 with severe PAH and cor pulmonale. Macitentan and Adcirca.   Admitted 5/21-5/30/19 with symptomatic hypotension and weight gain. He had a repeat RHC and echo with bubble study (results below). He was started on midodrine for BP support. Diuresed 25 lbs with IV lasix, then transitioned to torsemide 60 mg daily. Abdominal UKoreashowed cirrhosis with no evidence of portal HTN. Pulm consulted for possible ILD on High-res chest CT. Ortho consulted for right rotator cuff tear from horse  accident PTA. He was treated with antibiotics for Bacteremia. DC weight: 140 lbs.   At last visit Uptravi cut 800/600 -> 600/400 due to SEs (severe cramping)  He presents today for regular follow up. Remains on Adcirca and macitentan and Uptravi. Says he is doing pretty good. Going to gym exercising on TM and bike 6 days/week for 2 hours. Uptravi dose now only taking 400 once a day. Can tolerate 400/200 but doesn't have any more 200 mg tablets. Has not been able to tolerate 400 bid due to cramping. No edema, orthopnea or PND. No CP. Stable NYHA II DOE.    Echo 3/21 EF 45-50% AVR ok. Septal flattening RV dilated mild HK. Moderate TR RVSP 80 severe bilateral enlargement. Personally reviewed   Review of systems complete and found to be negative unless listed in HPI.   Studies:  RHC 5/21/19showed Mild/Moderate PAH in setting of high output with no evidence of intracardiac shunting. Hemodynamics as below.   High-Res Chest CT 05/16/18 1. Slightly irregular 0.9 cm peripheral right middle lobe solid pulmonary nodule. 3 month follow up recommended.  2. Dependent basilar predominant patchy subpleural reticulation and ground-glass attenuation with the suggestion of minimal associated traction bronchiolectasis. No frank honeycombing. Findings may indicate an interstitial lung disease such as early usual interstitial pneumonia (UIP) or fibrotic phase nonspecific interstitial pneumonia (NSIP). Suggest a follow-up high-resolution chest CT study in 6-12 months to assess temporal pattern stability, as clinically warranted. 3. Mild cardiomegaly. Minimal interlobular septal thickening and trace dependent bilateral  pleural effusions.  4. Small to moderate volume perihepatic ascites. 5. Ectatic 4.3 cm ascending thoracic aorta. Recommend annual imaging followup by CTA or MRA.  6. Left main and 3 vessel coronary atherosclerosis.  US Abdomen RUQ 05/16/18 - s/p cholecystectomy. + hepatic cirrhosis. Mild  ascites.   Liver Doppler 05/16/18 - No hepatic, splenic, or portal venous thrombosis or occlusion. Mild ascites.  Echo with bubble study 05/16/18 LVEF 55-60% with "late bubbles" , Mechanical AVR stable with trivial perivalvular regurg, Mild MR, Severe LAE, Severe RV dilation and reduced function. No PFO. Mod TR, PA peak pressure 68 mm Hg  [place]wan numbers5/24/19: PA 60-19 (32) CVP ~10 CO 5.4 CI 2.9  RHC 05/14/18 RA = 18 RV = 71/17 PA = 69/24 (39) PCW = 20 Fick cardiac output/index = 7.0/3.7 Thermo CO/CI = 7.2/3.8 PVR = 2.6 WU Ao sat = 97% PA sat = 73%, 73% SVC sat = 73%  RA sat = 72%  RHC 01/18/18 RA = 23 RV = 84/21 PA = 81/38 (53) PCW = 22 Fick cardiac output/index = 3.0/1.6 PVR = 10.4 WU FA sat = 98% on 2L  (checked on RA = 93%) PA sat = 60%, 61% SVC sat = 62%   Echo 8/18  1. LVEF is estimated to be 55%. 2. There is septal flattening consistent with right ventricular pressure and or volume overload. 3. A mechanical prosthetic aortic valve is present with mild AI 4. There is moderate to severe tricuspid regurgitation. 5. There is a trivial amount of pulmonic regurgitation. 6. The right ventricular systolic pressure is calculated at 65-25mHg. 7. The inferior vena cava is dilated with <50% inspiratory collapse, suggestive of an elevated right atrial pressure (163mg).  14 Day Patch 10/01/2017-10/15/2017:  -1 run of Ventricular Tachycardia occurred lasting 10 beats with a max rate of 240 bpm (avg 161 bpm). -Atrial Flutter occurred continuously (100% burden), ranging from 55-132 bpm (avg of 87 bpm).   NM Ventilation and Perfusion Lung Scan 10/31/2017:  Low probability of pulmonary embolism, using the PIOPED criteria.  Lower extremity venous duplex 07/25/2017:  1. Low probability of acute deep vein thrombosis in bilateral lower extremities. 2. Calf veins are poorly visualized but appear patent in limited segments visualized.   PFTs 07/02/2017:   FVC86% FEV185% FEV1/FVC 73% DLCO8.2, 32%   6MW -1400 feet; 97% to 85% (recovered with resting)   CT Chest07/08/2017: 1. Stable right middle lobe pulmonary nodule measuring up to 1 cm. 2. Minimal subpleural reticulation without evidence of honeycombing to  suggest pulmonary fibrosis. Stable centrilobular emphysema. 3. Mediastinal lymphadenopathy, increased from prior. 4. Calcified right hilar lymph nodes and multiple calcified granulomas  throughout the right lung consistent with prior granulomatous disease. 5. Ascending thoracic aortic aneurysm measuring up to 4.5 cm, unchanged. 6. Dilatation of the main pulmonary artery suggestive of pulmonary  hypertension. 7. Cardiomegaly and dense atherosclerotic coronary artery calcifications.    Past Medical History:  Diagnosis Date  . Allergic rhinitis 04/28/2016   Last Assessment & Plan:  Continue astelin  . Anemia 07/01/2019  . 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 In light of concurrent left nephrolithiasis, will check CT renal colic next year instead of renal [place]  . Marland Kitchennticoagulated on Coumadin 01/04/2018  . Anxiety 05/10/2016   Last Assessment & Plan:  Doing well off of zoloft.  . Asthma   . Asymptomatic microscopic hematuria 06/20/2017   Last Assessment & Plan:  Had hematuria  workup in [facility] in 2016 which negative CT and cystoscopy. UA with 2+ blood last visit - we discussed recommendation for repeat workup at 5 years or if degree of hematuria progresses.   . Atrial fibrillation ([place]) 03/29/2017  . Atrial flutter ([place]) 03/29/2017  . Backache 12/14/2015   Last Assessment & Plan:  Pain management referral for further evaluation.  . Bilateral pleural effusion 08/09/2017  . Chronic allergic rhinitis 04/28/2016   Last Assessment & Plan:  Continue astelin  . Chronic anticoagulation 03/29/2017  . Chronic atrial fibrillation ([place]) 07/23/2015    Last Assessment & Plan:  Coumadin and metoprolol, cardiology referral to establish care.  . Chronic midline back pain 12/14/2015   Last Assessment & Plan:  Pain management referral for further evaluation.  . Chronic obstructive lung disease ([place]) 06/12/2016   With hypoxia  . Chronic prostatitis 07/23/2015   Last Assessment & Plan:  Has largely resolved since stopping bike riding. Recommend annual DRE AND PSA - will see back 12/2015 for annual screening, given 1st degree fhx. To call office for recurrent prostatitis symptoms.   . Chronic respiratory failure with hypoxia ([place]) 07/29/2019  . Cirrhosis of liver not due to alcohol ([place]) 07/01/2019  . COPD (chronic obstructive pulmonary disease) ([place]) 06/12/2016  . Coronary arteriosclerosis in native artery 03/29/2017  . Coronary artery disease involving native coronary artery of native heart with angina pectoris ([place]) 03/29/2017  . Cough 10/17/2016   Last Assessment & Plan:  Discussed typical course for acute viral illness. If symptoms worsen or fail to improve by 7-10d, delayed ATBs, fluids, rest, NSAIDs/APAP prn. Seek care if not improving. Needs earlier INR check due to ATBs.  [name] Dyspnea 02/01/2016   Last Assessment & Plan:  Overall improving, eval by pulm, plan for CT, neg stress test with cardiology. Recent switch to carvedilol due to side effects.  . Encounter for therapeutic drug monitoring 01/06/2019  . Enterococcal bacteremia   . Epidermoid cyst of skin 08/24/2017  . Essential hypertension 12/14/2015   Last Assessment & Plan:  Hypertension control: controlled  Medications: compliant Medication Management: as noted in orders Home blood pressure monitoring recommended additionally as needed for symptoms  The patient's care plan was reviewed and updated. Instructions and counseling were provided regarding patient goals and barriers. He was counseled to adopt a healthy lifestyle. Educational resources and self-management tools have been provided as charted in [facility]  list.   . H/O maze procedure 03/29/2017  . [name] 03/29/2017   Overview:  2011  . History of coronary artery bypass graft 03/29/2017  . Hx of CABG 03/29/2017  . Hyperlipidemia 03/29/2017  . Hypersensitivity angiitis ([place]) 10/01/2017  . Hypertensive heart disease 03/29/2017  . Hypertensive heart disease with heart failure ([place]) 03/29/2017  . Hypertensive heart failure ([place]) 03/29/2017  . Hypokalemia due to excessive renal loss of potassium 02/18/2018  . Hypotension, chronic 06/06/2018  . International normalized ratio (INR) raised 07/26/2017  . Kidney stone 07/23/2015  . Kidney stones 07/23/2015   Overview:  x 3  Last Assessment & Plan:  By [place] has left nephrolithiasis, but not visible by KUB. Will check CT renal colic next year to assess both stone burden as well as to surveil AML.   [name] Left ureteral stone 01/23/2018  . Leukocytoclastic vasculitis ([place]) 10/01/2017  . Localized edema 01/04/2018  . Long term (current) use of anticoagulants 03/29/2017  . Lumbar radicular pain 01/19/2016  . Lumbar radiculopathy 01/19/2016  . Maculopapular rash 09/03/2017  .  Microscopic hematuria 06/20/2017   Last Assessment & Plan:  Had hematuria workup in [facility] in 2016 which negative CT and cystoscopy. UA with 2+ blood last visit - we discussed recommendation for repeat workup at 5 years or if degree of hematuria progresses.   . Multiple nodules of lung 06/12/2016  . Nasal discharge 02/25/2016   Last Assessment & Plan:  Trial zyrtec and flonase  . Nephrolithiasis 07/23/2015   Overview:  x 3  Last Assessment & Plan:  By [place] has left nephrolithiasis, but not visible by KUB. Will check CT renal colic next year to assess both stone burden as well as to surveil AML.   Overview:  x 3  Last Assessment & Plan:  Has 25m nonobstructing LUP stone - not visible by KUB.  Will check renal UKorea8/2019 - he will contact office sooner if symptomatic.   . Non-sustained ventricular tachycardia ([place] 03/29/2017  . Nonsustained  ventricular tachycardia ([place] 03/29/2017  . Other hyperlipidemia 03/29/2017  . Palpitations 10/01/2017  . Paroxysmal atrial fibrillation ([place] 03/29/2017  . Paroxysmal atrial fibrillation ([place] 03/29/2017  . Pleural effusion, bilateral 08/09/2017  . Post-nasal drainage 02/25/2016   Last Assessment & Plan:  Trial zyrtec and flonase  . Prostate cancer screening 06/20/2017   Last Assessment & Plan:  Recommend continued annual CaP screening until within 10 years of life expectancy. Given good health and fhx of longevity, would anticipate CaP screening to continue until age 89  PSA today and again in one year on day of visit.  . Pulmonary arterial hypertension ([place] 02/20/2018  . Pulmonary edema   . Pulmonary hypertension ([place] 08/09/2017  . Pulmonary nodules 06/12/2016  . S/P AVR 03/20/2016  . S/P AVR (aortic valve replacement) 03/20/2016  . Strain of deltoid muscle, initial encounter   . Supratherapeutic INR 07/26/2017  . Syncope 03/29/2017  . Syncope and collapse 02/01/2016  . Typical atrial flutter ([place] 02/01/2016    Past Surgical History:  Procedure Laterality Date  . CHOLECYSTECTOMY    . CORONARY ARTERY BYPASS GRAFT    . EXPLORATORY LAPAROTOMY  07/30/2017  . FOOT SURGERY    . FRACTURE SURGERY Right    wrist and forearm  . HERNIA REPAIR    . MECHANICAL AORTIC VALVE REPLACEMENT    . NASAL SINUS SURGERY    . RIGHT HEART CATH N/A 01/18/2018   Procedure: RIGHT HEART CATH;  Surgeon: BJolaine Artist MD;  [facility];  Service: Cardiovascular;  Laterality: N/A;  . RIGHT HEART CATH N/A 05/14/2018   Procedure: RIGHT HEART CATH;  Surgeon: BJolaine Artist MD;  [facility];  Service: Cardiovascular;  Laterality: N/A;  . TEE WITHOUT CARDIOVERSION N/A 05/21/2018   Procedure: TRANSESOPHAGEAL ECHOCARDIOGRAM (TEE);  Surgeon: BJolaine Artist MD;  [facility];  Service: Cardiovascular;  Laterality: N/A;  . UPPER GASTROINTESTINAL ENDOSCOPY  07/12/2017   Patchy  areas of mucosal inflammation noted in the antrum with edema,erthema and ulcerations. Bx. Chronicfocally active gastritis.  .Marland KitchenVASECTOMY      Current Medications: No outpatient medications have been marked as taking for the 10/28/20 encounter ([facility] with Quetzal Meany, DShaune Pascal MD.     Allergies:   Protonix [pantoprazole sodium], Amoxicillin-pot clavulanate, and Tape   Social History   Socioeconomic History  . Marital status: Single    Spouse name: Not on file  . Number of children: Not on file  . Years of education: Not on file  . Highest education level: Not  on file  Occupational History  . Not on file  Tobacco Use  . Smoking status: Former Smoker    Packs/day: 2.00    Years: 34.00    Pack years: 68.00    Types: Cigarettes    Quit date: 07/16/2000    Years since quitting: 20.2  . Smokeless tobacco: Never Used  Vaping Use  . Vaping Use: Never used  Substance and Sexual Activity  . Alcohol use: Not Currently  . Drug use: No  . Sexual activity: Not on file  Other Topics Concern  . Not on file  Social History Narrative  . Not on file   Social Determinants of Health   Financial Resource Strain: Low Risk   . Difficulty of Paying Living Expenses: Not hard at all  Food Insecurity: No Food Insecurity  . Worried About Charity fundraiser in the Last Year: Never true  . Ran Out of Food in the Last Year: Never true  Transportation Needs: No Transportation Needs  . Lack of Transportation (Medical): No  . Lack of Transportation (Non-Medical): No  Physical Activity:   . Days of Exercise per Week: Not on file  . Minutes of Exercise per Session: Not on file  Stress:   . Feeling of Stress : Not on file  Social Connections:   . Frequency of Communication with Friends and Family: Not on file  . Frequency of Social Gatherings with Friends and Family: Not on file  . Attends Religious Services: Not on file  . Active Member of Clubs or Organizations: Not on file  .  Attends Archivist Meetings: Not on file  . Marital Status: Not on file     Family History: The patient's family history includes Arthritis in his mother; Asthma in his mother; Heart attack in his father; Hypertension in his father; Stroke in his paternal grandmother. There is no history of Colon cancer.   Recent Labs: 05/17/2020: Magnesium 2.6 06/21/2020: B Natriuretic Peptide 154.4 10/12/2020: ALT 17; BUN 46; Creat 1.35; Potassium 4.2; Sodium 133 10/26/2020: Hemoglobin 15.4; Platelets 203  Recent Lipid Panel    Component Value Date/Time   CHOL 133 10/12/2020 1104   TRIG 111 10/12/2020 1104   HDL 44 10/12/2020 1104   CHOLHDL 3.0 10/12/2020 1104   VLDL 11 10/21/2018 1028   LDLCALC 70 10/12/2020 1104    Physical Exam:    VS:  BP (!) 142/70   Pulse 60   Wt 67.7 kg (149 lb 3.2 oz)   SpO2 96%   BMI 22.03 kg/m     Wt Readings from Last 3 Encounters:  10/28/20 67.7 kg (149 lb 3.2 oz)  10/26/20 68.6 kg (151 lb 3.2 oz)  10/12/20 64.9 kg (143 lb)    General:  Well appearing. No resp difficulty HEENT: normal Neck: supple. no JVD. Carotids 2+ bilat; no bruits. No lymphadenopathy or thryomegaly appreciated. Cor: PMI nondisplaced. Regular rate & rhythm. Mechanical s2. 2/6 SEM at RUSB Lungs: clear Abdomen: soft, nontender, nondistended. No hepatosplenomegaly. No bruits or masses. Good bowel sounds. Extremities: no cyanosis, clubbing, rash, edema Neuro: alert & orientedx3, cranial nerves grossly intact. moves all 4 extremities w/o difficulty. Affect pleasant   ASSESSMENT/PLAN:    1. Pulmonary hypertension with cor pulmonale/RV failure - WHO Group I  -? Component ofHHT/shunt/AVMs with late bubbles on bubble study.  - Echo with bubble study 05/16/18 LVEF 55-60% with "late bubbles" , Mechanical AVR stable with trivial perivalvular regurg, Mild MR, Severe LAE, Severe RV dilation and  reduced function. No PFO. Mod TR, PA peak pressure 68 mm Hg - Echo 10/19: EF 55-60% mild  LVH; s/p AVR with mean gradient 11 mmHg and trace AI; mild MR; severe biatrial enlargement; mild RVE; severe TR; severe pulmonary hypertension. RVSP 70mHG - Echo 03/17/20 EF 45-50% AVR ok. Septal flattening RV dilated mild HK. Moderate TR RVSP 80 severe bilateral enlargement. Personally reviewed - RHC 1/19 with mod/severe pulm HTN with RV failure.  - RHC 05/14/18 with Mild/Moderate PAH in setting of high output with no evidence of intracardiac shunting. - Ab u/s with cirrhosis but no evidence of portal HTN - Improved with Macitentan and Adcirca. Uptravi now down to 400 daily. Will give 200 mg tabs to try to titrate afternoon dose back up to get to 400 bid (or higher if possible) Once on stable dose of Uptravi for 3 months will repeat RHC - Stable NYHA II. Continues to exercise 6x/week. No change. Needs 6MW today - Volume status ok  - Continue torsemide 40 daily alternating with 60 daily. Using metolazone 2.521mwith K 40 meq about once a week .No change - Continue spiro 12.5 mg daily. - Continue midodrine 15g bid (it was 10 tid but he says it works better this way) - Auto-immune serologies negative (checked twice) - Labs today  2. Chronic respiratory failure - [place] CT 5/23/19with "dependent basilar predominant patchy subpleural reticulation and ground-glass attenuation with the suggestion of minimal associated traction bronchiolectasis. No frank honeycombing. Findings may indicate an interstitial lung disease such as early usual interstitial pneumonia (UIP) or fibrotic phase nonspecific interstitial pneumonia (NSIP)." -Followed by Dr. ByLamonte Sakai stable NYHA II   3. Chronic AFL - rate controlled.  Now off metoprolol. Continue coumadin with mechanical AVR. - No change.   4. CAD - s/p CABG 07/2017 [place]eric [place] - No s/s ischemia  - continue ASA with CAD and AVR - Unable to tolerate atorva due to myalgias.  -ContinueCrestor 46m54maily. Tolerating well - Lipids followed by  Dr. MunBettina Gavia. S/p mechanical AVR - stable on echo 3/21 Continue coumadin/ASA 81. INR goal is 2.5-3 per Dr MunBettina Gavia aware of need for SBE prophylaxis  6. Leukocytoclastic vasculitis - f/u with Rheumatology. No change.  7. Cirrhosis - US [place] RUQ 05/16/18 - s/p cholecystectomy. + hepatic cirrhosis. Mild ascites.  - Liver Doppler 05/16/18 - No hepatic, splenic, or portal venous thrombosis or occlusion. Mild ascites. - Continue midodrine 15g bid (it was 10 tid but he says it works better this way)   8. Iron-def anemia - followed by PCP   DanGlori BickersD  11:38 PM

## 2020-10-26 NOTE — Progress Notes (Signed)
   Subjective:    Patient ID: Lance Hicks, male    DOB: 09-28-1946, 74 y.o.   MRN: [id number]  HPI   Pt in with some very tender swollen tender lymph nodes since last visit.  3 years ago. He had some tender lymph nodes as well. Pt is doing some dental work progressively.  Pt feels like sinus draining into his throat.  On last visit I gave him azithromycin. Gave him flonase. He did not get some relief. He sent message and I added astelin.   Pt has hx of chf.  Neck hurts to move at times.       Review of Systems  Constitutional: Negative for chills, fatigue and fever.  HENT: Positive for ear pain. Negative for congestion, sinus pressure, sinus pain and sneezing.   Respiratory: Negative for cough, chest tightness, shortness of breath and wheezing.   Cardiovascular: Negative for chest pain.  Gastrointestinal: Negative for abdominal pain.  Genitourinary: Negative for dysuria and frequency.  Musculoskeletal: Negative for back pain.  Skin: Negative for rash.  Neurological: Negative for dizziness, numbness and headaches.  Hematological: Positive for adenopathy.  Psychiatric/Behavioral: Negative for behavioral problems and confusion.       Objective:   Physical Exam  General  Mental Status - Alert. General Appearance - Well groomed. Not in acute distress.  Skin Rashes- No Rashes.  Head- Normal. Ear Auditory Canal - Left- Normal. Right - Normal.Tympanic Membrane- Left- Normal. Right- mild faint redness to tm. Eye Sclera/Conjunctiva- Left- Normal. Right- Normal. Nose & Sinuses Nasal Mucosa- Left-  Boggy and Congested. Right-  Boggy and  Congested.Bilateral  No maxillary and no  frontal sinus pressure. Mouth & Throat Lips: Upper Lip- Normal: no dryness, cracking, pallor, cyanosis, or vesicular eruption. Lower Lip-Normal: no dryness, cracking, pallor, cyanosis or vesicular eruption. Buccal Mucosa- rt side back side dark black apeearing spot 2-3 mm Oropharynx- No  Discharge or Erythema. Tonsils: Characteristics- Bilateral- No Erythema or Congestion. Size/Enlargement- Bilateral- No enlargement. Discharge- bilateral-None.  Neck Neck- Supple. No Masses. Tender rt subamandibular areal. Mild enlarged lymph node. Anterior cervical region in front of sternocledomastoid and pain beneath eath.   Chest and Lung Exam Auscultation: Breath Sounds:-Clear even and unlabored.  Cardiovascular Auscultation:Rythm- Regular, rate and rhythm. Murmurs & Other Heart Sounds:Ausculatation of the heart reveal- No Murmurs.  Lymphatic Head & Neck General Head & Neck Lymphatics: Bilateral: Description- No Localized lymphadenopathy.       Assessment & Plan:  Rt ear pain and eustachian tube dysfunction. Rocephin 1 gram im. Also add 3 day taper medrol taper. Continue nasal sprays flonase and astelin. May add oral antibiotic but want to review [place] first.  Refer to ENT. Evaluate discolored buccal mucosa and neck area.  May need to see dentist sooner if pain on rt side neck persists.  For rt side lymphadenopathy get [place] of neck and cbc.  Hx of low na and decreased cr get repeat cmp.  Folllow up 10 days or as needed  Time spent with patient today was  32  minutes which consisted of chart review, discussing diagnosis, work up, treatment, referral and documentation.

## 2020-10-27 LAB — CBC WITH DIFFERENTIAL/PLATELET
Absolute Monocytes: 1004 cells/uL — ABNORMAL HIGH (ref 200–950)
Basophils Absolute: 28 cells/uL (ref 0–200)
Basophils Relative: 0.3 %
Eosinophils Absolute: 279 cells/uL (ref 15–500)
Eosinophils Relative: 3 %
HCT: 44.8 % (ref 38.5–50.0)
Hemoglobin: 15.4 g/dL (ref 13.2–17.1)
Lymphs Abs: 921 cells/uL (ref 850–3900)
MCH: 30.9 pg (ref 27.0–33.0)
MCHC: 34.4 g/dL (ref 32.0–36.0)
MCV: 90 fL (ref 80.0–100.0)
MPV: 10.1 fL (ref 7.5–12.5)
Monocytes Relative: 10.8 %
Neutro Abs: 7068 cells/uL (ref 1500–7800)
Neutrophils Relative %: 76 %
Platelets: 203 10*3/uL (ref 140–400)
RBC: 4.98 10*6/uL (ref 4.20–5.80)
RDW: 13.4 % (ref 11.0–15.0)
Total Lymphocyte: 9.9 %
WBC: 9.3 10*3/uL (ref 3.8–10.8)

## 2020-10-27 LAB — COMPREHENSIVE METABOLIC PANEL
AG Ratio: 1.3 (calc) (ref 1.0–2.5)
ALT: 18 U/L (ref 9–46)
AST: 32 U/L (ref 10–35)
Albumin: 4.5 g/dL (ref 3.6–5.1)
Alkaline phosphatase (APISO): 59 U/L (ref 35–144)
BUN/Creatinine Ratio: 32 (calc) — ABNORMAL HIGH (ref 6–22)
BUN: 50 mg/dL — ABNORMAL HIGH (ref 7–25)
CO2: 25 mmol/L (ref 20–32)
Calcium: 10.3 mg/dL (ref 8.6–10.3)
Chloride: 95 mmol/L — ABNORMAL LOW (ref 98–110)
Creat: 1.56 mg/dL — ABNORMAL HIGH (ref 0.70–1.18)
Globulin: 3.6 g/dL (calc) (ref 1.9–3.7)
Glucose, Bld: 82 mg/dL (ref 65–99)
Potassium: 4.5 mmol/L (ref 3.5–5.3)
Sodium: 134 mmol/L — ABNORMAL LOW (ref 135–146)
Total Bilirubin: 1.4 mg/dL — ABNORMAL HIGH (ref 0.2–1.2)
Total Protein: 8.1 g/dL (ref 6.1–8.1)

## 2020-10-28 ENCOUNTER — Encounter (HOSPITAL_COMMUNITY): Payer: Self-pay | Admitting: Internal Medicine

## 2020-10-28 ENCOUNTER — Ambulatory Visit (HOSPITAL_COMMUNITY)
Admission: RE | Admit: 2020-10-28 | Discharge: 2020-10-28 | Disposition: A | Discharge status: Home / Self Care | Payer: Medicare Other | Source: Ambulatory Visit | Attending: Internal Medicine | Admitting: Internal Medicine

## 2020-10-28 ENCOUNTER — Other Ambulatory Visit: Payer: Self-pay

## 2020-10-28 VITALS — BP 142/70 | HR 60 | Wt 149.2 lb

## 2020-10-28 DIAGNOSIS — I2721 Secondary pulmonary arterial hypertension: Secondary | ICD-10-CM | POA: Insufficient documentation

## 2020-10-28 DIAGNOSIS — J9611 Chronic respiratory failure with hypoxia: Secondary | ICD-10-CM | POA: Diagnosis not present

## 2020-10-28 DIAGNOSIS — Z880 Allergy status to penicillin: Secondary | ICD-10-CM | POA: Insufficient documentation

## 2020-10-28 DIAGNOSIS — Z888 Allergy status to other drugs, medicaments and biological substances status: Secondary | ICD-10-CM | POA: Insufficient documentation

## 2020-10-28 DIAGNOSIS — M310 Hypersensitivity angiitis: Secondary | ICD-10-CM | POA: Diagnosis not present

## 2020-10-28 DIAGNOSIS — Z9049 Acquired absence of other specified parts of digestive tract: Secondary | ICD-10-CM | POA: Insufficient documentation

## 2020-10-28 DIAGNOSIS — Z7901 Long term (current) use of anticoagulants: Secondary | ICD-10-CM | POA: Diagnosis not present

## 2020-10-28 DIAGNOSIS — Z952 Presence of prosthetic heart valve: Secondary | ICD-10-CM | POA: Diagnosis not present

## 2020-10-28 DIAGNOSIS — D509 Iron deficiency anemia, unspecified: Secondary | ICD-10-CM | POA: Diagnosis not present

## 2020-10-28 DIAGNOSIS — Z951 Presence of aortocoronary bypass graft: Secondary | ICD-10-CM | POA: Insufficient documentation

## 2020-10-28 DIAGNOSIS — K7460 Unspecified cirrhosis of liver: Secondary | ICD-10-CM | POA: Diagnosis not present

## 2020-10-28 DIAGNOSIS — I2510 Atherosclerotic heart disease of native coronary artery without angina pectoris: Secondary | ICD-10-CM | POA: Diagnosis not present

## 2020-10-28 DIAGNOSIS — M25511 Pain in right shoulder: Secondary | ICD-10-CM | POA: Diagnosis not present

## 2020-10-28 DIAGNOSIS — Z8249 Family history of ischemic heart disease and other diseases of the circulatory system: Secondary | ICD-10-CM | POA: Diagnosis not present

## 2020-10-28 DIAGNOSIS — Z87891 Personal history of nicotine dependence: Secondary | ICD-10-CM | POA: Insufficient documentation

## 2020-10-28 MED ORDER — SELEXIPAG 200 MCG PO TABS: 200.0000 ug | ORAL_TABLET | Freq: Every evening | ORAL | 6 refills | Status: DC

## 2020-10-28 MED ORDER — UPTRAVI 400 MCG PO TABS: 400.0000 ug | ORAL_TABLET | Freq: Every morning | ORAL | 11 refills | Status: DC

## 2020-10-28 NOTE — Patient Instructions (Signed)
You have been referred to Dr Ninfa Linden at [facility] in [place], his office will call you for an appointment  Please call our office in April 2022 to schedule your follow up appointment  If you have any questions or concerns before your next appointment please send [place] a message through [place] or call our office at [phone].    TO LEAVE A MESSAGE FOR THE NURSE SELECT OPTION 2, PLEASE LEAVE A MESSAGE INCLUDING: . YOUR NAME . DATE OF BIRTH . CALL BACK NUMBER . REASON FOR CALL**this is important as we prioritize the call backs  [place] AS LONG AS YOU CALL BEFORE 4:00 PM  At the [facility], you and your health needs are our priority. As part of our continuing mission to provide you with exceptional heart care, we have created designated Provider Care Teams. These Care Teams include your primary Cardiologist (physician) and Advanced Practice Providers (APPs- Physician Assistants and Nurse Practitioners) who all work together to provide you with the care you need, when you need it.   You may see any of the following providers on your designated Care Team at your next follow up: [name] Dr Glori Bickers . Dr Loralie Champagne . Darrick Grinder, NP . Lyda Jester, PA . Audry Riles, PharmD   Please be sure to bring in all your medications bottles to every appointment.

## 2020-10-28 NOTE — Progress Notes (Signed)
6 Min Walk Test Completed  Pt ambulated 1.82 meters O2 Sat ranged  85-97%on no  oxygen HR ranged 69-82. Patient denies any shortness of breathe. Pt aware to monitor oxygen saturation at home.

## 2020-10-29 ENCOUNTER — Ambulatory Visit: Payer: Medicare Other | Admitting: Medical

## 2020-11-02 ENCOUNTER — Other Ambulatory Visit: Payer: Self-pay

## 2020-11-02 ENCOUNTER — Ambulatory Visit (INDEPENDENT_AMBULATORY_CARE_PROVIDER_SITE_OTHER): Payer: Medicare Other

## 2020-11-02 DIAGNOSIS — Z5181 Encounter for therapeutic drug level monitoring: Secondary | ICD-10-CM

## 2020-11-02 DIAGNOSIS — Z7901 Long term (current) use of anticoagulants: Secondary | ICD-10-CM | POA: Diagnosis not present

## 2020-11-02 DIAGNOSIS — I4892 Unspecified atrial flutter: Secondary | ICD-10-CM

## 2020-11-02 LAB — POCT INR: INR: 1.8 — AB (ref 2.0–3.0)

## 2020-11-02 NOTE — Patient Instructions (Signed)
Take 1.5 tablets today only and then Continue taking 1/2 tablet daily except 1 tablet on Sunday and  Wednesdays.  Please call our office with any medication changes or concerns [phone]. Return in 4 weeks for INR check.

## 2020-11-05 ENCOUNTER — Ambulatory Visit: Payer: Medicare Other | Admitting: Medical

## 2020-11-08 ENCOUNTER — Ambulatory Visit (INDEPENDENT_AMBULATORY_CARE_PROVIDER_SITE_OTHER): Payer: Medicare Other

## 2020-11-08 ENCOUNTER — Encounter: Payer: Self-pay | Admitting: Physician Assistant

## 2020-11-08 ENCOUNTER — Ambulatory Visit (INDEPENDENT_AMBULATORY_CARE_PROVIDER_SITE_OTHER): Payer: Medicare Other | Admitting: Physician Assistant

## 2020-11-08 ENCOUNTER — Other Ambulatory Visit: Payer: Self-pay

## 2020-11-08 DIAGNOSIS — I2510 Atherosclerotic heart disease of native coronary artery without angina pectoris: Secondary | ICD-10-CM | POA: Diagnosis not present

## 2020-11-08 DIAGNOSIS — M542 Cervicalgia: Secondary | ICD-10-CM

## 2020-11-08 DIAGNOSIS — M25511 Pain in right shoulder: Secondary | ICD-10-CM

## 2020-11-08 MED ORDER — CYCLOBENZAPRINE HCL 5 MG PO TABS: 5.0000 mg | ORAL_TABLET | Freq: Three times a day (TID) | ORAL | 0 refills | Status: DC | PRN

## 2020-11-08 NOTE — Progress Notes (Signed)
Office Visit Note   Patient: Lance Hicks           Date of Birth: Nov 20, 1946           MRN: [id number] Visit Date: 11/08/2020              Requested by: Jolaine Artist, MD [address] [place],  [place] [address] PCP: [name], PA-C   Assessment & Plan: Visit Diagnoses:  1. Neck pain   2. Right shoulder pain, unspecified chronicity     Plan: He is given a prescription for physical therapy to work on range of motion home exercise program for his neck they will include modalities.  She had a recent Medrol dose pack would not recommend this at this point time.  Next step if he does not get better would perform an MRI of his neck to rule out HNP as a source of his neck and shoulder pain.  Questions were encouraged and answered at length.  Follow-Up Instructions: Return in about 4 weeks (around 12/06/2020).   Orders:  Orders Placed This Encounter  Procedures  . XR Shoulder Right  . XR Cervical Spine 2 or 3 views   Meds ordered this encounter  Medications  . cyclobenzaprine (FLEXERIL) 5 MG tablet    Sig: Take 1 tablet (5 mg total) by mouth 3 (three) times daily as needed for muscle spasms.    Dispense:  30 tablet    Refill:  0      Procedures: No procedures performed   Clinical Data: No additional findings.   Subjective: Chief Complaint  Patient presents with  . Right Shoulder - Pain  . Neck - Pain    HPI Mr.Lance Hicks is a pleasant 74 year old male who comes in today with right shoulder and neck pain no numbness tingling.  He notes that the right arm seems weaker than his left.  History of right shoulder arthroscopy in the early 2000's.  He notes decreased range of motion of his neck.  Pains been ongoing for the past 4 to 5 months.  No known injury.  Takes Tylenol but this does not help.  Is unable to take NSAIDs as he is on chronic Coumadin. Review of Systems See HPI otherwise negative  Objective: Vital Signs: There were no vitals  taken for this visit.  Physical Exam Constitutional:      Appearance: He is not ill-appearing or diaphoretic.  Pulmonary:     Effort: Pulmonary effort is normal.  Neurological:     Mental Status: He is alert and oriented to person, place, and time.  Psychiatric:        Mood and Affect: Mood normal.     Ortho Exam Cervical spine he has pain with rotation of his neck to the right and left.  Normal cervical flexion and extension.  Positive Spurling's.  Tenderness along the medial border of the right shoulder.  Upper extremities 5 out of 5 strength throughout against resistance.  Negative impingement testing bilateral shoulders.  Sensation grossly intact bilateral hands and full motor bilateral hands. Specialty Comments:  No specialty comments available.  Imaging: XR Cervical Spine 2 or 3 views  Result Date: 11/08/2020 Cervical spine: 2 views show no acute fractures to space overall well-maintained.  No spondylolisthesis.  Mild arthritic changes with endplate spurring lower cervical bodies.  XR Shoulder Right  Result Date: 11/08/2020 Right shoulder 3 views: Shoulders well located.  Slight high riding humeral head.  Mild narrowing of the  glenohumeral joint.  Postsurgical changes distal clavicle and subacromial changes.  No acute fractures.    PMFS History: Patient Active Problem List   Diagnosis Date Noted  . Chronic respiratory failure with hypoxia ([place]) 07/29/2019  . Cirrhosis of liver not due to alcohol ([place]) 07/01/2019  . Anemia 07/01/2019  . Encounter for therapeutic drug monitoring 01/06/2019  . Hypotension, chronic 06/06/2018  . Enterococcal bacteremia   . Pulmonary edema   . Strain of deltoid muscle, initial encounter   . Pulmonary arterial hypertension ([place]) 02/20/2018  . Hypokalemia due to excessive renal loss of potassium 02/18/2018  . Left ureteral stone 01/23/2018  . Anticoagulated on Coumadin 01/04/2018  . Localized edema 01/04/2018  . Leukocytoclastic  vasculitis ([place]) 10/01/2017  . Palpitations 10/01/2017  . Hypersensitivity angiitis ([place]) 10/01/2017  . Maculopapular rash 09/03/2017  . Epidermoid cyst of skin 08/24/2017  . Bilateral pleural effusion 08/09/2017  . Pulmonary hypertension ([place]) 08/09/2017  . Supratherapeutic INR 07/26/2017  . International normalized ratio (INR) raised 07/26/2017  . Microscopic hematuria 06/20/2017  . Prostate cancer screening 06/20/2017  . Atrial flutter ([place]) 03/29/2017  . Chronic anticoagulation 03/29/2017  . Coronary artery disease involving native coronary artery of native heart with angina pectoris ([place]) 03/29/2017  . H/O maze procedure 03/29/2017  . History of coronary artery bypass graft 03/29/2017  . Hypertensive heart disease with heart failure ([place]) 03/29/2017  . Nonsustained ventricular tachycardia ([place]) 03/29/2017  . Hyperlipidemia 03/29/2017  . Syncope 03/29/2017  . Coronary arteriosclerosis in native artery 03/29/2017  . Hypertensive heart failure ([place]) 03/29/2017  . Long term (current) use of anticoagulants 03/29/2017  . Cough 10/17/2016  . Chronic obstructive lung disease ([place]) 06/12/2016  . Pulmonary nodules 06/12/2016  . Multiple nodules of lung 06/12/2016  . Anxiety 05/10/2016  . 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 Allergic rhinitis 04/28/2016  . [name] 03/20/2016  . S/P AVR 03/20/2016  . Nasal discharge 02/25/2016  . Dyspnea 02/01/2016  . Syncope and collapse 02/01/2016  . Typical atrial flutter ([place]) 02/01/2016  . Lumbar radicular pain 01/19/2016  . Lumbar radiculopathy 01/19/2016  . Backache 12/14/2015  . Essential hypertension 12/14/2015  . Chronic prostatitis 07/23/2015  . Nephrolithiasis 07/23/2015  . Kidney stone 07/23/2015  . Chronic atrial fibrillation ([place]) 07/23/2015   Past Medical History:  Diagnosis Date  . Allergic rhinitis 04/28/2016   Last Assessment & Plan:  Continue astelin  . Anemia 07/01/2019  .  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 In light of concurrent left nephrolithiasis, will check CT renal colic next year instead of renal US.   [name] Anticoagulated on Coumadin 01/04/2018  . Anxiety 05/10/2016   Last Assessment & Plan:  Doing well off of zoloft.  . Asthma   . Asymptomatic microscopic hematuria 06/20/2017   Last Assessment & Plan:  Had hematuria workup in [facility] in 2016 which negative CT and cystoscopy. UA with 2+ blood last visit - we discussed recommendation for repeat workup at 5 years or if degree of hematuria progresses.   . Atrial fibrillation ([place]) 03/29/2017  . Atrial flutter ([place]) 03/29/2017  . Backache 12/14/2015   Last Assessment & Plan:  Pain management referral for further evaluation.  . Bilateral pleural effusion 08/09/2017  . Chronic allergic rhinitis 04/28/2016   Last Assessment & Plan:  Continue astelin  . Chronic anticoagulation 03/29/2017  . Chronic atrial fibrillation ([place]) 07/23/2015   Last Assessment & Plan:  Coumadin and metoprolol, cardiology referral to establish care.  . Chronic midline back pain 12/14/2015   Last Assessment & Plan:  Pain management referral for further evaluation.  . Chronic obstructive lung disease ([place]) 06/12/2016   With hypoxia  . Chronic prostatitis 07/23/2015   Last Assessment & Plan:  Has largely resolved since stopping bike riding. Recommend annual DRE AND PSA - will see back 12/2015 for annual screening, given 1st degree fhx. To call office for recurrent prostatitis symptoms.   . Chronic respiratory failure with hypoxia ([place]) 07/29/2019  . Cirrhosis of liver not due to alcohol ([place]) 07/01/2019  . COPD (chronic obstructive pulmonary disease) ([place]) 06/12/2016  . Coronary arteriosclerosis in native artery 03/29/2017  . Coronary artery disease involving native coronary artery of native heart with angina pectoris ([place]) 03/29/2017  . Cough 10/17/2016   Last Assessment & Plan:  Discussed typical  course for acute viral illness. If symptoms worsen or fail to improve by 7-10d, delayed ATBs, fluids, rest, NSAIDs/APAP prn. Seek care if not improving. Needs earlier INR check due to ATBs.  [name] Dyspnea 02/01/2016   Last Assessment & Plan:  Overall improving, eval by pulm, plan for CT, neg stress test with cardiology. Recent switch to carvedilol due to side effects.  . Encounter for therapeutic drug monitoring 01/06/2019  . Enterococcal bacteremia   . Epidermoid cyst of skin 08/24/2017  . Essential hypertension 12/14/2015   Last Assessment & Plan:  Hypertension control: controlled  Medications: compliant Medication Management: as noted in orders Home blood pressure monitoring recommended additionally as needed for symptoms  The patient's care plan was reviewed and updated. Instructions and counseling were provided regarding patient goals and barriers. He was counseled to adopt a healthy lifestyle. Educational resources and self-management tools have been provided as charted in [facility] list.   . H/O maze procedure 03/29/2017  . [name] 03/29/2017   Overview:  2011  . History of coronary artery bypass graft 03/29/2017  . Hx of CABG 03/29/2017  . Hyperlipidemia 03/29/2017  . Hypersensitivity angiitis ([place]) 10/01/2017  . Hypertensive heart disease 03/29/2017  . Hypertensive heart disease with heart failure ([place]) 03/29/2017  . Hypertensive heart failure ([place]) 03/29/2017  . Hypokalemia due to excessive renal loss of potassium 02/18/2018  . Hypotension, chronic 06/06/2018  . International normalized ratio (INR) raised 07/26/2017  . Kidney stone 07/23/2015  . Kidney stones 07/23/2015   Overview:  x 3  Last Assessment & Plan:  By [place] has left nephrolithiasis, but not visible by KUB. Will check CT renal colic next year to assess both stone burden as well as to surveil AML.   [name] Left ureteral stone 01/23/2018  . Leukocytoclastic vasculitis ([place]) 10/01/2017  . Localized edema 01/04/2018  . Long term  (current) use of anticoagulants 03/29/2017  . Lumbar radicular pain 01/19/2016  . Lumbar radiculopathy 01/19/2016  . Maculopapular rash 09/03/2017  . Microscopic hematuria 06/20/2017   Last Assessment & Plan:  Had hematuria workup in [facility] in 2016 which negative CT and cystoscopy. UA with 2+ blood last visit - we discussed recommendation for repeat workup at 5 years or if degree of hematuria progresses.   . Multiple nodules of lung 06/12/2016  . Nasal discharge 02/25/2016   Last Assessment & Plan:  Trial zyrtec and flonase  . Nephrolithiasis 07/23/2015   Overview:  x 3  Last Assessment & Plan:  By [place] has left nephrolithiasis, but not visible by KUB. Will check CT renal colic next year to  assess both stone burden as well as to surveil AML.   Overview:  x 3  Last Assessment & Plan:  Has 11m nonobstructing LUP stone - not visible by KUB.  Will check renal [place]/2019 - he will contact office sooner if symptomatic.   . Non-sustained ventricular tachycardia ([place] 03/29/2017  . Nonsustained ventricular tachycardia ([place] 03/29/2017  . Other hyperlipidemia 03/29/2017  . Palpitations 10/01/2017  . Paroxysmal atrial fibrillation ([place] 03/29/2017  . Paroxysmal atrial fibrillation ([place] 03/29/2017  . Pleural effusion, bilateral 08/09/2017  . Post-nasal drainage 02/25/2016   Last Assessment & Plan:  Trial zyrtec and flonase  . Prostate cancer screening 06/20/2017   Last Assessment & Plan:  Recommend continued annual CaP screening until within 10 years of life expectancy. Given good health and fhx of longevity, would anticipate CaP screening to continue until age 74  PSA today and again in one year on day of visit.  . Pulmonary arterial hypertension ([place] 02/20/2018  . Pulmonary edema   . Pulmonary hypertension ([place] 08/09/2017  . Pulmonary nodules 06/12/2016  . S/P AVR 03/20/2016  . S/P AVR (aortic valve replacement) 03/20/2016  . Strain of deltoid muscle, initial encounter   . Supratherapeutic INR 07/26/2017  . Syncope 03/29/2017  .  Syncope and collapse 02/01/2016  . Typical atrial flutter ([place] 02/01/2016    Family History  Problem Relation Age of Onset  . Asthma Mother   . Arthritis Mother   . Heart attack Father   . Hypertension Father   . Stroke Paternal Grandmother   . Colon cancer Neg Hx     Past Surgical History:  Procedure Laterality Date  . CHOLECYSTECTOMY    . CORONARY ARTERY BYPASS GRAFT    . EXPLORATORY LAPAROTOMY  07/30/2017  . FOOT SURGERY    . FRACTURE SURGERY Right    wrist and forearm  . HERNIA REPAIR    . MECHANICAL AORTIC VALVE REPLACEMENT    . NASAL SINUS SURGERY    . RIGHT HEART CATH N/A 01/18/2018   Procedure: RIGHT HEART CATH;  Surgeon: BJolaine Artist MD;  [facility];  Service: Cardiovascular;  Laterality: N/A;  . RIGHT HEART CATH N/A 05/14/2018   Procedure: RIGHT HEART CATH;  Surgeon: BJolaine Artist MD;  [facility];  Service: Cardiovascular;  Laterality: N/A;  . TEE WITHOUT CARDIOVERSION N/A 05/21/2018   Procedure: TRANSESOPHAGEAL ECHOCARDIOGRAM (TEE);  Surgeon: BJolaine Artist MD;  [facility];  Service: Cardiovascular;  Laterality: N/A;  . UPPER GASTROINTESTINAL ENDOSCOPY  07/12/2017   Patchy areas of mucosal inflammation noted in the antrum with edema,erthema and ulcerations. Bx. Chronicfocally active gastritis.  .Marland KitchenVASECTOMY     Social History   Occupational History  . Not on file  Tobacco Use  . Smoking status: Former Smoker    Packs/day: 2.00    Years: 34.00    Pack years: 68.00    Types: Cigarettes    Quit date: 07/16/2000    Years since quitting: 20.3  . Smokeless tobacco: Never Used  Vaping Use  . Vaping Use: Never used  Substance and Sexual Activity  . Alcohol use: Not Currently  . Drug use: No  . Sexual activity: Not on file

## 2020-11-09 DIAGNOSIS — H903 Sensorineural hearing loss, bilateral: Secondary | ICD-10-CM | POA: Diagnosis not present

## 2020-11-09 DIAGNOSIS — R198 Other specified symptoms and signs involving the digestive system and abdomen: Secondary | ICD-10-CM | POA: Diagnosis not present

## 2020-11-09 DIAGNOSIS — M26609 Unspecified temporomandibular joint disorder, unspecified side: Secondary | ICD-10-CM | POA: Diagnosis not present

## 2020-11-09 DIAGNOSIS — R591 Generalized enlarged lymph nodes: Secondary | ICD-10-CM | POA: Diagnosis not present

## 2020-11-09 DIAGNOSIS — H9191 Unspecified hearing loss, right ear: Secondary | ICD-10-CM | POA: Diagnosis not present

## 2020-11-12 ENCOUNTER — Telehealth: Payer: Self-pay | Admitting: Orthopaedic Surgery

## 2020-11-12 NOTE — Telephone Encounter (Signed)
Pt called asking to have his PT referral changed from the [facility] to the [facility] one in [place]? Pt would like to be updated if this will be a possibility  [phone]

## 2020-11-12 NOTE — Telephone Encounter (Signed)
Holding for [place]/Dr Ninfa Linden

## 2020-11-15 ENCOUNTER — Other Ambulatory Visit: Payer: Self-pay

## 2020-11-15 DIAGNOSIS — M542 Cervicalgia: Secondary | ICD-10-CM

## 2020-11-15 DIAGNOSIS — M25511 Pain in right shoulder: Secondary | ICD-10-CM

## 2020-11-15 NOTE — Telephone Encounter (Signed)
LMOM for patient letting him know we sent order to [facility]

## 2020-11-30 ENCOUNTER — Ambulatory Visit (INDEPENDENT_AMBULATORY_CARE_PROVIDER_SITE_OTHER): Payer: Medicare Other

## 2020-11-30 ENCOUNTER — Other Ambulatory Visit: Payer: Self-pay

## 2020-11-30 DIAGNOSIS — I4892 Unspecified atrial flutter: Secondary | ICD-10-CM | POA: Diagnosis not present

## 2020-11-30 DIAGNOSIS — Z5181 Encounter for therapeutic drug level monitoring: Secondary | ICD-10-CM

## 2020-11-30 DIAGNOSIS — Z7901 Long term (current) use of anticoagulants: Secondary | ICD-10-CM

## 2020-11-30 LAB — POCT INR: INR: 2.7 (ref 2.0–3.0)

## 2020-11-30 MED ORDER — WARFARIN SODIUM 5 MG PO TABS
ORAL_TABLET | ORAL | 5 refills | Status: DC
Start: 2020-11-30 — End: 2021-07-18

## 2020-11-30 NOTE — Patient Instructions (Signed)
Continue taking 1/2 tablet daily except 1 tablet on Sunday and  Wednesdays.  Please call our office with any medication changes or concerns [phone]. Return in 6 weeks for INR check.

## 2020-12-02 ENCOUNTER — Other Ambulatory Visit: Payer: Self-pay | Admitting: Gastroenterology

## 2020-12-02 DIAGNOSIS — M25511 Pain in right shoulder: Secondary | ICD-10-CM | POA: Diagnosis not present

## 2020-12-02 DIAGNOSIS — M542 Cervicalgia: Secondary | ICD-10-CM | POA: Diagnosis not present

## 2020-12-06 ENCOUNTER — Other Ambulatory Visit: Payer: Self-pay | Admitting: Gastroenterology

## 2020-12-06 DIAGNOSIS — M25511 Pain in right shoulder: Secondary | ICD-10-CM | POA: Diagnosis not present

## 2020-12-06 DIAGNOSIS — M542 Cervicalgia: Secondary | ICD-10-CM | POA: Diagnosis not present

## 2020-12-13 ENCOUNTER — Telehealth (HOSPITAL_COMMUNITY): Payer: Self-pay | Admitting: Pharmacy Technician

## 2020-12-13 NOTE — Telephone Encounter (Signed)
Patient Advocate Encounter  Was successful in securing patient a $10,000 grant from [employer] to provide copayment coverage for [facility] medications.  This will keep the out of pocket expense at $0.     I have spoken with the patient and emailed him the grant information to give to [place]. Advised him to call me with any issues.   Member ID: [id number]  Group ID: [id number] RxBin: [id number] Dates of Eligibility: 11/13/20 through 11/12/21  Fund:  Pulmonary [place]

## 2021-01-03 DIAGNOSIS — M25511 Pain in right shoulder: Secondary | ICD-10-CM | POA: Diagnosis not present

## 2021-01-03 DIAGNOSIS — M542 Cervicalgia: Secondary | ICD-10-CM | POA: Diagnosis not present

## 2021-01-06 ENCOUNTER — Other Ambulatory Visit: Payer: Self-pay | Admitting: Medical

## 2021-01-06 ENCOUNTER — Telehealth (HOSPITAL_COMMUNITY): Payer: Self-pay | Admitting: Pharmacist

## 2021-01-06 NOTE — Telephone Encounter (Signed)
Advanced Heart Failure Patient Advocate Encounter  Prior Authorization for [place] has been approved.    PA# [id number] Effective dates: 12/07/2020 through 01/06/2024   Audry Riles, PharmD, BCPS, BCCP, CPP Heart Failure Clinic Pharmacist [phone]

## 2021-01-10 ENCOUNTER — Telehealth: Payer: Self-pay

## 2021-01-10 NOTE — Telephone Encounter (Signed)
LMOM TO R/S

## 2021-01-11 ENCOUNTER — Telehealth (HOSPITAL_COMMUNITY): Payer: Self-pay | Admitting: Pharmacist

## 2021-01-11 NOTE — Telephone Encounter (Signed)
Advanced Heart Failure Patient Advocate Encounter  Prior Authorization for tadalafil has been approved.    PA# [id number] Effective dates: 12/12/2020 through 01/11/2022  Audry Riles, PharmD, BCPS, BCCP, CPP Heart Failure Clinic Pharmacist [phone]

## 2021-01-13 DIAGNOSIS — M25511 Pain in right shoulder: Secondary | ICD-10-CM | POA: Diagnosis not present

## 2021-01-13 DIAGNOSIS — M542 Cervicalgia: Secondary | ICD-10-CM | POA: Diagnosis not present

## 2021-01-18 ENCOUNTER — Other Ambulatory Visit: Payer: Self-pay

## 2021-01-18 ENCOUNTER — Ambulatory Visit (INDEPENDENT_AMBULATORY_CARE_PROVIDER_SITE_OTHER): Payer: Medicare Other

## 2021-01-18 DIAGNOSIS — Z7901 Long term (current) use of anticoagulants: Secondary | ICD-10-CM | POA: Diagnosis not present

## 2021-01-18 DIAGNOSIS — Z5181 Encounter for therapeutic drug level monitoring: Secondary | ICD-10-CM

## 2021-01-18 DIAGNOSIS — I4892 Unspecified atrial flutter: Secondary | ICD-10-CM | POA: Diagnosis not present

## 2021-01-18 LAB — POCT INR: INR: 1.5 — AB (ref 2.0–3.0)

## 2021-01-18 NOTE — Patient Instructions (Signed)
Take 1.5 tablets tonight only and then Continue taking 1/2 tablet daily except 1 tablet on Sunday and  Wednesdays.  Please call our office with any medication changes or concerns [phone]. Return in 4 weeks for INR check.

## 2021-01-24 ENCOUNTER — Telehealth (INDEPENDENT_AMBULATORY_CARE_PROVIDER_SITE_OTHER): Payer: Medicare Other | Admitting: Family

## 2021-01-24 ENCOUNTER — Encounter: Payer: Self-pay | Admitting: Family

## 2021-01-24 ENCOUNTER — Other Ambulatory Visit: Payer: Self-pay

## 2021-01-24 VITALS — BP 120/70 | HR 64 | Temp 97.3°F | Ht 69.5 in | Wt 146.0 lb

## 2021-01-24 DIAGNOSIS — U071 COVID-19: Secondary | ICD-10-CM | POA: Diagnosis not present

## 2021-01-24 NOTE — Progress Notes (Signed)
Virtual Visit via Telephone Note  I connected with Lance Hicks on 01/24/21 at  8:00 AM EST by telephone and verified that I am speaking with the correct person using two identifiers.  Location: Patient: home Provider: office   I discussed the limitations, risks, security and privacy concerns of performing an evaluation and management service by telephone and the availability of in person appointments. I also discussed with the patient that there may be a patient responsible charge related to this service. The patient expressed understanding and agreed to proceed.   History of Present Illness:  Patient is a 75 yr old male with multiple medical problems including CAD, CHF, atrial flutter/fibrillation, cirrhosis of lever, and mechanical AVR (on coumadin INR 1.5 six days ago- followed by coumadin clinic, who presents today to discuss covid-19 infection.  He tested positive on 01/18/21. Symptoms began on 01/14/21.  Initial symptoms included anorexia, myalgia.  Reports that yesterday he had diarrhea. Denies vomiting. No   Reports abdominal pain/diarrhea, myalgia.  Reports that he is down 2 pounds.  Reports mild cough. Nasal congestion is at baseline. He reports loss of taste/smell.  He as had 3 covid vaccines. Reports oxygen 97-98%.  Reports mild LE edema which is at baseline.  He has had 3 Moderna vaccines.  Wt Readings from Last 3 Encounters:  01/24/21 146 lb (66.2 kg)  10/28/20 149 lb 3.2 oz (67.7 kg)  10/26/20 151 lb 3.2 oz (68.6 kg)    BP Readings from Last 3 Encounters:  01/24/21 120/70  10/28/20 (!) 142/70  10/26/20 123/76    Observations/Objective:   Gen: Awake, alert  Resp: Breathing sounds even and non-labored Psych: calm/pleasant demeanor Neuro: Alert and Oriented x 3, speech is clear.  Assessment and Plan:  Covid-19 infection-we discussed supportive measures including hydration, as needed Imodium for diarrhea, and as needed Tylenol for body aches.  He has not taken his  fluid pill today and I advised him that this is okay as long as his weight remains stable.  He is afraid of becoming too dehydrated with diarrhea, and I agree.  Once he is tolerating full p.o.'s and diarrhea is resolved he can resume his normal diuretic regimen.  We discussed red flags that should prompt emergency room visit including increased weakness, shortness of breath, inability to keep up hydration, nausea or vomiting.   Follow Up Instructions:    I discussed the assessment and treatment plan with the patient. The patient was provided an opportunity to ask questions and all were answered. The patient agreed with the plan and demonstrated an understanding of the instructions.   The patient was advised to call back or seek an in-person evaluation if the symptoms worsen or if the condition fails to improve as anticipated.  I provided 12 minutes of non-face-to-face time during this encounter.   Nance Pear, NP

## 2021-01-27 ENCOUNTER — Telehealth (HOSPITAL_COMMUNITY): Payer: Self-pay | Admitting: *Deleted

## 2021-01-27 ENCOUNTER — Emergency Department (HOSPITAL_COMMUNITY)
Admission: EM | Admit: 2021-01-27 | Discharge: 2021-01-28 | Disposition: A | Discharge status: Home / Self Care | Payer: Medicare Other | Attending: Emergency Medicine | Admitting: Emergency Medicine

## 2021-01-27 ENCOUNTER — Telehealth: Payer: Self-pay | Admitting: Medical

## 2021-01-27 ENCOUNTER — Emergency Department (HOSPITAL_COMMUNITY): Payer: Medicare Other

## 2021-01-27 ENCOUNTER — Encounter (HOSPITAL_COMMUNITY): Payer: Self-pay | Admitting: Emergency Medicine

## 2021-01-27 ENCOUNTER — Other Ambulatory Visit: Payer: Self-pay

## 2021-01-27 DIAGNOSIS — R197 Diarrhea, unspecified: Secondary | ICD-10-CM

## 2021-01-27 DIAGNOSIS — I110 Hypertensive heart disease with heart failure: Secondary | ICD-10-CM | POA: Diagnosis not present

## 2021-01-27 DIAGNOSIS — I25119 Atherosclerotic heart disease of native coronary artery with unspecified angina pectoris: Secondary | ICD-10-CM | POA: Insufficient documentation

## 2021-01-27 DIAGNOSIS — R1031 Right lower quadrant pain: Secondary | ICD-10-CM | POA: Diagnosis not present

## 2021-01-27 DIAGNOSIS — I509 Heart failure, unspecified: Secondary | ICD-10-CM | POA: Insufficient documentation

## 2021-01-27 DIAGNOSIS — Z7982 Long term (current) use of aspirin: Secondary | ICD-10-CM | POA: Diagnosis not present

## 2021-01-27 DIAGNOSIS — Z79899 Other long term (current) drug therapy: Secondary | ICD-10-CM | POA: Insufficient documentation

## 2021-01-27 DIAGNOSIS — J45909 Unspecified asthma, uncomplicated: Secondary | ICD-10-CM | POA: Diagnosis not present

## 2021-01-27 DIAGNOSIS — U071 COVID-19: Secondary | ICD-10-CM

## 2021-01-27 DIAGNOSIS — Z951 Presence of aortocoronary bypass graft: Secondary | ICD-10-CM | POA: Diagnosis not present

## 2021-01-27 DIAGNOSIS — Z7901 Long term (current) use of anticoagulants: Secondary | ICD-10-CM | POA: Insufficient documentation

## 2021-01-27 DIAGNOSIS — Z87891 Personal history of nicotine dependence: Secondary | ICD-10-CM | POA: Insufficient documentation

## 2021-01-27 DIAGNOSIS — E860 Dehydration: Secondary | ICD-10-CM

## 2021-01-27 DIAGNOSIS — R009 Unspecified abnormalities of heart beat: Secondary | ICD-10-CM | POA: Diagnosis not present

## 2021-01-27 DIAGNOSIS — J449 Chronic obstructive pulmonary disease, unspecified: Secondary | ICD-10-CM | POA: Insufficient documentation

## 2021-01-27 LAB — URINALYSIS, ROUTINE W REFLEX MICROSCOPIC
Bilirubin Urine: NEGATIVE
Glucose, UA: NEGATIVE mg/dL
Hgb urine dipstick: NEGATIVE
Ketones, ur: NEGATIVE mg/dL
Leukocytes,Ua: NEGATIVE
Nitrite: NEGATIVE
Protein, ur: NEGATIVE mg/dL
Specific Gravity, Urine: 1.006 (ref 1.005–1.030)
pH: 7 (ref 5.0–8.0)

## 2021-01-27 LAB — COMPREHENSIVE METABOLIC PANEL
ALT: 16 U/L (ref 0–44)
AST: 37 U/L (ref 15–41)
Albumin: 4 g/dL (ref 3.5–5.0)
Alkaline Phosphatase: 43 U/L (ref 38–126)
Anion gap: 12 (ref 5–15)
BUN: 20 mg/dL (ref 8–23)
CO2: 25 mmol/L (ref 22–32)
Calcium: 9.7 mg/dL (ref 8.9–10.3)
Chloride: 97 mmol/L — ABNORMAL LOW (ref 98–111)
Creatinine, Ser: 1.58 mg/dL — ABNORMAL HIGH (ref 0.61–1.24)
GFR, Estimated: 46 mL/min — ABNORMAL LOW (ref >60)
Glucose, Bld: 92 mg/dL (ref 70–99)
Potassium: 3.5 mmol/L (ref 3.5–5.1)
Sodium: 134 mmol/L — ABNORMAL LOW (ref 135–145)
Total Bilirubin: 2.3 mg/dL — ABNORMAL HIGH (ref 0.3–1.2)
Total Protein: 8.1 g/dL (ref 6.5–8.1)

## 2021-01-27 LAB — PROTIME-INR
INR: 3.8 — ABNORMAL HIGH (ref 0.8–1.2)
Prothrombin Time: 35.9 seconds — ABNORMAL HIGH (ref 11.4–15.2)

## 2021-01-27 LAB — CBC
HCT: 42.7 % (ref 39.0–52.0)
Hemoglobin: 13.8 g/dL (ref 13.0–17.0)
MCH: 28.5 pg (ref 26.0–34.0)
MCHC: 32.3 g/dL (ref 30.0–36.0)
MCV: 88.2 fL (ref 80.0–100.0)
Platelets: 197 10*3/uL (ref 150–400)
RBC: 4.84 MIL/uL (ref 4.22–5.81)
RDW: 14.5 % (ref 11.5–15.5)
WBC: 5.4 10*3/uL (ref 4.0–10.5)
nRBC: 0 % (ref 0.0–0.2)

## 2021-01-27 LAB — LIPASE, BLOOD: Lipase: 46 U/L (ref 11–51)

## 2021-01-27 MED ORDER — MORPHINE SULFATE (PF) 4 MG/ML IV SOLN
4.0000 mg | Freq: Once | INTRAVENOUS | Status: AC
  Administered 2021-01-27: 4 mg via INTRAVENOUS
  Filled 2021-01-27: qty 1

## 2021-01-27 MED ORDER — ONDANSETRON HCL 4 MG/2ML IJ SOLN
4.0000 mg | Freq: Once | INTRAMUSCULAR | Status: AC
  Administered 2021-01-27: 4 mg via INTRAVENOUS
  Filled 2021-01-27: qty 2

## 2021-01-27 MED ORDER — HYDROCODONE-ACETAMINOPHEN 5-325 MG PO TABS: 2.0000 | ORAL_TABLET | ORAL | 0 refills | Status: DC | PRN

## 2021-01-27 MED ORDER — SODIUM CHLORIDE 0.9 % IV BOLUS
500.0000 mL | Freq: Once | INTRAVENOUS | Status: AC
  Administered 2021-01-27: 500 mL via INTRAVENOUS

## 2021-01-27 NOTE — ED Provider Notes (Signed)
[facility] Provider Note   CSN: [id number] Arrival date & time: 01/27/21  1723     History Chief Complaint  Patient presents with  . Covid Positive  . Abdominal Pain    Covid +     [facility] is a 75 y.o. male.  Pt presents to the ED today with dehydration, low blood pressure, and Covid.  Pt has been fully vaccinated (+booster) and was diagnosed with Covid on 1/25.  Sx started on 1/21.  Pt said he has had severe diarrhea and has had some abdominal pain.  Pt also said his bp was very low last night and it was low today.  He said he's stopped eating because he has severe diarrhea right after eating.  Pt denies any fevers.  He has aches all over.  He generally feels terrible.        Past Medical History:  Diagnosis Date  . Allergic rhinitis 04/28/2016   Last Assessment & Plan:  Continue astelin  . Anemia 07/01/2019  . 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 In light of concurrent left nephrolithiasis, will check CT renal colic next year instead of renal US.   [name] Anticoagulated on Coumadin 01/04/2018  . Anxiety 05/10/2016   Last Assessment & Plan:  Doing well off of zoloft.  . Asthma   . Asymptomatic microscopic hematuria 06/20/2017   Last Assessment & Plan:  Had hematuria workup in [facility] in 2016 which negative CT and cystoscopy. UA with 2+ blood last visit - we discussed recommendation for repeat workup at 5 years or if degree of hematuria progresses.   . Atrial fibrillation ([place]) 03/29/2017  . Atrial flutter ([place]) 03/29/2017  . Backache 12/14/2015   Last Assessment & Plan:  Pain management referral for further evaluation.  . Bilateral pleural effusion 08/09/2017  . Chronic allergic rhinitis 04/28/2016   Last Assessment & Plan:  Continue astelin  . Chronic anticoagulation 03/29/2017  . Chronic atrial fibrillation ([place]) 07/23/2015   Last Assessment & Plan:  Coumadin and metoprolol,  cardiology referral to establish care.  . Chronic midline back pain 12/14/2015   Last Assessment & Plan:  Pain management referral for further evaluation.  . Chronic obstructive lung disease ([place]) 06/12/2016   With hypoxia  . Chronic prostatitis 07/23/2015   Last Assessment & Plan:  Has largely resolved since stopping bike riding. Recommend annual DRE AND PSA - will see back 12/2015 for annual screening, given 1st degree fhx. To call office for recurrent prostatitis symptoms.   . Chronic respiratory failure with hypoxia ([place]) 07/29/2019  . 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 COPD (chronic obstructive pulmonary disease) ([place]) 06/12/2016  . Coronary arteriosclerosis in native artery 03/29/2017  . Coronary artery disease involving native coronary artery of native heart with angina pectoris ([place]) 03/29/2017  . Cough 10/17/2016   Last Assessment & Plan:  Discussed typical course for acute viral illness. If symptoms worsen or fail to improve by 7-10d, delayed ATBs, fluids, rest, NSAIDs/APAP prn. Seek care if not improving. Needs earlier INR check due to ATBs.  [name] Dyspnea 02/01/2016   Last Assessment & Plan:  Overall improving, eval by pulm, plan for CT, neg stress test with cardiology. Recent switch to carvedilol due to side effects.  . Encounter for therapeutic drug monitoring 01/06/2019  . Enterococcal bacteremia   . Epidermoid cyst of skin 08/24/2017  . Essential hypertension  12/14/2015   Last Assessment & Plan:  Hypertension control: controlled  Medications: compliant Medication Management: as noted in orders Home blood pressure monitoring recommended additionally as needed for symptoms  The patient's care plan was reviewed and updated. Instructions and counseling were provided regarding patient goals and barriers. He was counseled to adopt a healthy lifestyle. Educational resources and self-management tools have been provided as charted in [facility] list.   . H/O maze procedure 03/29/2017  . H/O  mechanical aortic valve replacement 03/29/2017   Overview:  2011  . History of coronary artery bypass graft 03/29/2017  . Hx of CABG 03/29/2017  . Hyperlipidemia 03/29/2017  . Hypersensitivity angiitis ([place]) 10/01/2017  . Hypertensive heart disease 03/29/2017  . Hypertensive heart disease with heart failure ([place]) 03/29/2017  . Hypokalemia due to excessive renal loss of potassium 02/18/2018  . Hypotension, chronic 06/06/2018  . International normalized ratio (INR) raised 07/26/2017  . Kidney stones 07/23/2015   Overview:  x 3  Last Assessment & Plan:  By [place] has left nephrolithiasis, but not visible by KUB. Will check CT renal colic next year to assess both stone burden as well as to surveil AML.   [name] Left ureteral stone 01/23/2018  . Leukocytoclastic vasculitis ([place]) 10/01/2017  . Localized edema 01/04/2018  . Long term (current) use of anticoagulants 03/29/2017  . Lumbar radiculopathy 01/19/2016  . Maculopapular rash 09/03/2017  . Microscopic hematuria 06/20/2017   Last Assessment & Plan:  Had hematuria workup in [facility] in [facility] which negative CT and cystoscopy. UA with 2+ blood last visit - we discussed recommendation for repeat workup at 5 years or if degree of hematuria progresses.   . Multiple nodules of lung 06/12/2016  . Nasal discharge 02/25/2016   Last Assessment & Plan:  Trial zyrtec and flonase  . Nephrolithiasis 07/23/2015   Overview:  x 3  Last Assessment & Plan:  By [place] has left nephrolithiasis, but not visible by KUB. Will check CT renal colic next year to assess both stone burden as well as to surveil AML.   Overview:  x 3  Last Assessment & Plan:  Has 62m nonobstructing LUP stone - not visible by KUB.  Will check renal [place]2019 - he will contact office sooner if symptomatic.   . Non-sustained ventricular tachycardia ([place] 03/29/2017  . Nonsustained ventricular tachycardia ([place] 03/29/2017  . Other hyperlipidemia 03/29/2017  . Palpitations 10/01/2017  . Paroxysmal atrial fibrillation ([place] 03/29/2017  . Pleural  effusion, bilateral 08/09/2017  . Post-nasal drainage 02/25/2016   Last Assessment & Plan:  Trial zyrtec and flonase  . Prostate cancer screening 06/20/2017   Last Assessment & Plan:  Recommend continued annual CaP screening until within 10 years of life expectancy. Given good health and fhx of longevity, would anticipate CaP screening to continue until age 75  PSA today and again in one year on day of visit.  . Pulmonary arterial hypertension ([place] 02/20/2018  . Pulmonary edema   . Pulmonary hypertension ([place] 08/09/2017  . Pulmonary nodules 06/12/2016  . S/P AVR (aortic valve replacement) 03/20/2016  . Strain of deltoid muscle, initial encounter   . Supratherapeutic INR 07/26/2017  . Syncope 03/29/2017  . Syncope and collapse 02/01/2016  . Typical atrial flutter ([place] 02/01/2016    Patient Active Problem List   Diagnosis Date Noted  . Chronic respiratory failure with hypoxia ([place] 07/29/2019  . Cirrhosis of liver not due to alcohol ([place] 07/01/2019  . Anemia 07/01/2019  . Encounter for therapeutic drug monitoring  01/06/2019  . Hypotension, chronic 06/06/2018  . Enterococcal bacteremia   . Pulmonary edema   . Strain of deltoid muscle, initial encounter   . Pulmonary arterial hypertension ([place]) 02/20/2018  . Hypokalemia due to excessive renal loss of potassium 02/18/2018  . Left ureteral stone 01/23/2018  . Anticoagulated on Coumadin 01/04/2018  . Localized edema 01/04/2018  . Leukocytoclastic vasculitis ([place]) 10/01/2017  . Palpitations 10/01/2017  . Hypersensitivity angiitis ([place]) 10/01/2017  . Maculopapular rash 09/03/2017  . Epidermoid cyst of skin 08/24/2017  . Bilateral pleural effusion 08/09/2017  . Pulmonary hypertension ([place]) 08/09/2017  . Supratherapeutic INR 07/26/2017  . International normalized ratio (INR) raised 07/26/2017  . Microscopic hematuria 06/20/2017  . Prostate cancer screening 06/20/2017  . Atrial flutter ([place]) 03/29/2017  . Chronic anticoagulation 03/29/2017  .  Coronary artery disease involving native coronary artery of native heart with angina pectoris ([place]) 03/29/2017  . H/O maze procedure 03/29/2017  . History of coronary artery bypass graft 03/29/2017  . Hypertensive heart disease with heart failure ([place]) 03/29/2017  . Nonsustained ventricular tachycardia ([place]) 03/29/2017  . Hyperlipidemia 03/29/2017  . Syncope 03/29/2017  . Coronary arteriosclerosis in native artery 03/29/2017  . Hypertensive heart failure ([place]) 03/29/2017  . Long term (current) use of anticoagulants 03/29/2017  . Cough 10/17/2016  . Chronic obstructive lung disease ([place]) 06/12/2016  . Pulmonary nodules 06/12/2016  . Multiple nodules of lung 06/12/2016  . Anxiety 05/10/2016  . 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 Allergic rhinitis 04/28/2016  . [name] 03/20/2016  . S/P AVR 03/20/2016  . Nasal discharge 02/25/2016  . Dyspnea 02/01/2016  . Syncope and collapse 02/01/2016  . Typical atrial flutter ([place]) 02/01/2016  . Lumbar radicular pain 01/19/2016  . Lumbar radiculopathy 01/19/2016  . Backache 12/14/2015  . Essential hypertension 12/14/2015  . Chronic prostatitis 07/23/2015  . Nephrolithiasis 07/23/2015  . Kidney stone 07/23/2015  . Chronic atrial fibrillation ([place]) 07/23/2015    Past Surgical History:  Procedure Laterality Date  . CHOLECYSTECTOMY    . CORONARY ARTERY BYPASS GRAFT    . EXPLORATORY LAPAROTOMY  07/30/2017  . FOOT SURGERY    . FRACTURE SURGERY Right    wrist and forearm  . HERNIA REPAIR    . MECHANICAL AORTIC VALVE REPLACEMENT    . NASAL SINUS SURGERY    . RIGHT HEART CATH N/A 01/18/2018   Procedure: RIGHT HEART CATH;  Surgeon: Jolaine Artist, MD;  Location: [facility];  Service: Cardiovascular;  Laterality: N/A;  . RIGHT HEART CATH N/A 05/14/2018   Procedure: RIGHT HEART CATH;  Surgeon: Jolaine Artist, MD;  Location: [place] [facility];  Service: Cardiovascular;  Laterality: N/A;  .  TEE WITHOUT CARDIOVERSION N/A 05/21/2018   Procedure: TRANSESOPHAGEAL ECHOCARDIOGRAM (TEE);  Surgeon: Jolaine Artist, MD;  Location: [facility] ENDOSCOPY;  Service: Cardiovascular;  Laterality: N/A;  . UPPER GASTROINTESTINAL ENDOSCOPY  07/12/2017   Patchy areas of mucosal inflammation noted in the antrum with edema,erthema and ulcerations. Bx. Chronicfocally active gastritis.  [name] VASECTOMY         Family History  Problem Relation Age of Onset  . Asthma Mother   . Arthritis Mother   . Heart attack Father   . Hypertension Father   . Stroke Paternal Grandmother   . Colon cancer Neg Hx     Social History   Tobacco Use  . Smoking status: Former Smoker    Packs/day: 2.00    Years: 34.00    Pack  years: 68.00    Types: Cigarettes    Quit date: 07/16/2000    Years since quitting: 20.5  . Smokeless tobacco: Never Used  Vaping Use  . Vaping Use: Never used  Substance Use Topics  . Alcohol use: Not Currently  . Drug use: No    Home Medications Prior to Admission medications   Medication Sig Start Date End Date Taking? Authorizing Provider  acetaminophen (TYLENOL) 650 MG CR tablet Take 1 tablet (650 mg total) by mouth every 8 (eight) hours as needed for pain. Not to use with norco since combined with tylenol. 02/02/19   Saguier, Percell Miller, PA-C  albuterol (VENTOLIN HFA) 108 (90 Base) MCG/ACT inhaler Inhale 2 puffs into the lungs every 6 (six) hours as needed for wheezing or shortness of breath. 09/20/20   Collene Gobble, MD  aspirin EC 81 MG tablet Take 81 mg by mouth daily.    [provider]  B Complex-C (B-COMPLEX WITH VITAMIN C) tablet Take 1 tablet by mouth daily.     [provider]  Cholecalciferol (VITAMIN D) 125 MCG (5000 UT) CAPS Take 1 capsule by mouth daily.    [provider]  Coenzyme Q10 (COQ10 PO) Take 1 capsule by mouth daily.    [provider]  cyclobenzaprine (FLEXERIL) 5 MG tablet Take 1 tablet (5 mg total) by mouth 3 (three) times daily  as needed for muscle spasms. 11/08/20   Pete Pelt, PA-C  fluticasone (FLONASE) 50 MCG/ACT nasal spray Place 2 sprays into both nostrils daily. 10/12/20   Saguier, Percell Miller, PA-C  HYDROcodone-acetaminophen (NORCO) 5-325 MG tablet Take 2 tablets by mouth every 4 (four) hours as needed. 01/27/21   Veryl Speak, MD  KRILL OIL PO Take 1 capsule by mouth daily.    [provider]  Magnesium Gluconate (MAGNESIUM 27 PO) Take 1 tablet by mouth every other day.     [provider]  metolazone (ZAROXOLYN) 2.5 MG tablet Take 2.5 mg by mouth as needed.    [provider]  midodrine (PROAMATINE) 10 MG tablet Take 1 tablet (10 mg total) by mouth 3 (three) times daily with meals. 10/11/20   Bensimhon, Shaune Pascal, MD  OPSUMIT 10 MG tablet TAKE 1 TABLET DAILY 10/26/20   Bensimhon, Shaune Pascal, MD  potassium chloride SA (KLOR-CON) 20 MEQ tablet Take 2 tablets (40 mEq total) by mouth 2 (two) times daily. 07/22/20   Bensimhon, Shaune Pascal, MD  rosuvastatin (CRESTOR) 5 MG tablet TAKE 1 TABLET(5 MG) BY MOUTH DAILY 09/08/20   Bensimhon, Shaune Pascal, MD  Selexipag (UPTRAVI) 400 MCG TABS Take 400 mcg by mouth in the morning. 10/28/20   Bensimhon, Shaune Pascal, MD  Selexipag 200 MCG TABS Take 1 tablet (200 mcg total) by mouth every evening. 10/28/20   Bensimhon, Shaune Pascal, MD  spironolactone (ALDACTONE) 25 MG tablet TAKE 1/2 OF A TABLET BY MOUTH ONCE DAILY. 02/26/20   Larey Dresser, MD  tadalafil, PAH, (ADCIRCA) 20 MG tablet TAKE 2 TABLETS ONCE A DAY. 10/25/20   Bensimhon, Shaune Pascal, MD  torsemide (DEMADEX) 20 MG tablet Take 60 mg daily alternating with 40 mg daily 08/17/20   Larey Dresser, MD  Vitamin D, Ergocalciferol, (DRISDOL) 1.25 MG (50000 UNIT) CAPS capsule TAKE 1 CAPSULE BY MOUTH EVERY 7 DAYS 01/06/21   Saguier, Percell Miller, PA-C  warfarin (COUMADIN) 5 MG tablet TAKE 1/2 TO 1 TABLET DAILY AS DIRECTED BY COUMADIN CLINIC 11/30/20   Richardo Priest, MD    Allergies  Protonix [pantoprazole sodium],  Amoxicillin-pot clavulanate, and Tape  Review of Systems   Review of Systems  Constitutional: Positive for activity change.  Gastrointestinal: Positive for abdominal pain and diarrhea.  Neurological: Positive for weakness.  All other systems reviewed and are negative.   Physical Exam Updated Vital Signs BP 110/61   Pulse (!) 56   Temp 98.8 F (37.1 C) (Oral)   Resp (!) 23   SpO2 98%   Physical Exam Vitals and nursing note reviewed.  Constitutional:      Appearance: He is well-developed.  HENT:     Head: Normocephalic and atraumatic.     Mouth/Throat:     Mouth: Mucous membranes are dry.  Eyes:     Extraocular Movements: Extraocular movements intact.     Pupils: Pupils are equal, round, and reactive to light.  Cardiovascular:     Rate and Rhythm: Normal rate. Rhythm irregular.  Pulmonary:     Effort: Pulmonary effort is normal.     Breath sounds: Normal breath sounds.  Abdominal:     General: Abdomen is flat. Bowel sounds are normal.     Palpations: Abdomen is soft.     Tenderness: There is abdominal tenderness in the right lower quadrant.  Skin:    General: Skin is warm.     Capillary Refill: Capillary refill takes less than 2 seconds.  Neurological:     General: No focal deficit present.     Mental Status: He is alert and oriented to person, place, and time.  Psychiatric:        Mood and Affect: Mood normal.        Behavior: Behavior normal.     ED Results / Procedures / Treatments   Labs (all labs ordered are listed, but only abnormal results are displayed) Labs Reviewed  COMPREHENSIVE METABOLIC PANEL - Abnormal; Notable for the following components:      Result Value   Sodium 134 (*)    Chloride 97 (*)    Creatinine, Ser 1.58 (*)    Total Bilirubin 2.3 (*)    GFR, Estimated 46 (*)    All other components within normal limits  PROTIME-INR - Abnormal; Notable for the following components:   Prothrombin Time 35.9 (*)    INR 3.8 (*)    All other  components within normal limits  LIPASE, BLOOD  CBC  URINALYSIS, ROUTINE W REFLEX MICROSCOPIC    EKG None  Radiology CT ABDOMEN PELVIS WO CONTRAST  Result Date: 01/27/2021 CLINICAL DATA:  Diarrhea. Abdominal pain with nausea. Flank pain. COVID positive. EXAM: CT ABDOMEN AND PELVIS WITHOUT CONTRAST TECHNIQUE: Multidetector CT imaging of the abdomen and pelvis was performed following the standard protocol without IV contrast. COMPARISON:  CT 06/19/2019 FINDINGS: Lower chest: Cardiomegaly. Coronary artery calcifications. Aortic valvular calcifications. Sequela of prior granulomatous disease with calcified nodules and right infrahilar calcified node. Mild patchy ground-glass opacity in the lingula and to a lesser extent lower lobes consistent with COVID pneumonia. Subpleural nodule in the right middle lobe measures 9 mm, series 5, image 4. This is unchanged dating back to 05/16/2018 for 21 month stability. Hepatobiliary: No focal hepatic abnormality on noncontrast exam. The enhancing 0.5 cm lesion in the right lobe on prior exam is not seen in the absence of IV contrast. Gallbladder surgically absent. No biliary dilatation. Pancreas: Mild atrophy.  No ductal dilatation or inflammation. Spleen: Normal in size without focal abnormality. Adrenals/Urinary Tract: Normal adrenal glands. No hydronephrosis or renal calculi. Moderate density lesions in  both kidneys are most consistent with cysts, though incompletely characterized on noncontrast exam. Probable hyperdense cyst in the posterior mid right kidney. Additional tiny cortical hypodensities in both kidneys are too small to characterize. Urinary bladder is unremarkable. Stomach/Bowel: Decompressed stomach. Normal positioning of the duodenum. No small bowel obstruction or inflammation. Normal appendix. Diverticulosis from the distal transverse colon distally. Diverticular changes are prominent in the sigmoid. No evidence of diverticulitis. Vascular/Lymphatic:  Advanced aortic atherosclerosis. Mild aortic tortuosity. No aortic aneurysm. No bulky abdominopelvic adenopathy. Reproductive: Prostate is unremarkable. Other: No ascites or free air.  No abdominal wall hernia. Musculoskeletal: Degenerative change in the spine. Chronic bilateral L5 pars interarticularis defects. Stable grade 1 anterolisthesis of L4 on L5. There are no acute or suspicious osseous abnormalities. IMPRESSION: 1. No acute abnormality in the abdomen/pelvis. 2. Colonic diverticulosis without diverticulitis. 3. Ground-glass opacities in the lingula and to a lesser extent lower lobes consistent with COVID pneumonia. 4. Right middle lobe pulmonary nodule measuring 9 mm demonstrates 21 month stability, near certainly benign. Aortic Atherosclerosis (ICD10-I70.0). Electronically Signed   By: Keith Rake M.D.   On: 01/27/2021 21:15   DG Chest Portable 1 View  Result Date: 01/27/2021 CLINICAL DATA:  COVID-19 positivity EXAM: PORTABLE CHEST 1 VIEW COMPARISON:  09/17/2020 FINDINGS: Cardiac shadow is enlarged. Postsurgical changes are again noted and stable. Patchy chronic scarring is noted bilaterally similar to that seen on prior CT examination. Multiple calcified granulomas are seen. Patchy airspace disease is noted in the left lung base consistent with the given clinical history. No acute bony abnormality is noted. IMPRESSION: Patchy airspace opacities in the left base consistent with the given clinical history of COVID-19 positivity. Chronic scarring with calcified granulomas similar to that seen on prior CT. Electronically Signed   By: Inez Catalina M.D.   On: 01/27/2021 21:03    Procedures Procedures   Medications Ordered in ED Medications  sodium chloride 0.9 % bolus 500 mL (500 mLs Intravenous New Bag/Given 01/27/21 2149)  ondansetron (ZOFRAN) injection 4 mg (4 mg Intravenous Given 01/27/21 2146)  morphine 4 MG/ML injection 4 mg (4 mg Intravenous Given 01/27/21 2146)    ED Course  I have  reviewed the triage vital signs and the nursing notes.  Pertinent labs & imaging results that were available during my care of the patient were reviewed by me and considered in my medical decision making (see chart for details).    MDM Rules/Calculators/A&P                          Pt is feeling much better after treatment.  BP is better.  I will hold his diuretics for tomorrow.  Pt is stable for d/c.  Return if worse.  PENN GRISSETT was evaluated in [facility] on 01/27/2021 for the symptoms described in the history of present illness. He was evaluated in the context of the global COVID-19 pandemic, which necessitated consideration that the patient might be at risk for infection with the SARS-CoV-2 virus that causes COVID-19. Institutional protocols and algorithms that pertain to the evaluation of patients at risk for COVID-19 are in a state of rapid change based on information released by regulatory bodies including the CDC and federal and state organizations. These policies and algorithms were followed during the patient's care in the ED.  Final Clinical Impression(s) / ED Diagnoses Final diagnoses:  Diarrhea, unspecified type  COVID-19  Dehydration    Rx / DC Orders ED Discharge Orders  Ordered    HYDROcodone-acetaminophen (NORCO) 5-325 MG tablet  Every 4 hours PRN,   Status:  Discontinued        01/27/21 2344    HYDROcodone-acetaminophen (NORCO) 5-325 MG tablet  Every 4 hours PRN        01/27/21 2345           Isla Pence, MD 01/27/21 2348

## 2021-01-27 NOTE — Discharge Instructions (Signed)
Do not take Metolazone, Torsemide, and Spironolactone tomorrow.  Everything else, take as usual.

## 2021-01-27 NOTE — ED Notes (Signed)
Pt tolerated orthostatic vital signs well.

## 2021-01-27 NOTE — ED Provider Notes (Signed)
Dr. Gilford Raid having technical difficulty with Imp[employer].  Norco prescribed by me due to this.   Veryl Speak, MD 01/27/21 [phone]

## 2021-01-27 NOTE — Telephone Encounter (Signed)
Patient states he was dx with covid 19, patient states virtual visit last week. Patient he is note getting any better, please advise   Patient is having diarrhea, abd pain and trouble keeping blood pressure up

## 2021-01-27 NOTE — ED Triage Notes (Signed)
Pt arrives to ED with concerns due to covid + and having generalized abd pain with nausea and flank pain. Pt denies any CP or SOB no fever. He is also concerned because last night he was feeling faint and his bp dropped to 70's. Normotensive in ED

## 2021-01-27 NOTE — Telephone Encounter (Signed)
Pt left VM stating he is covid + and not getting any better he is headed to the emergency room and wanted Dr.Bensimhon to be aware.  Routed to [place] as [facility]

## 2021-01-27 NOTE — Telephone Encounter (Signed)
With those symptoms following covid I have concern that he is dehydrated. Please advise pt to be seen at emergency department. [place] ED option or urgent care.

## 2021-01-27 NOTE — Telephone Encounter (Signed)
Called patient and he was headed to the hospital , and he stated he would update [place]

## 2021-01-28 ENCOUNTER — Telehealth: Payer: Self-pay

## 2021-01-28 NOTE — Telephone Encounter (Signed)
Nurse Assessment Nurse: Donna Christen, RN, Legrand Como Date/Time [name] Time): 01/27/2021 1:10:45 PM Confirm and document reason for call. If symptomatic, describe symptoms. ---Caller has low blood pressure and upset stomach. Positive for COVID on Friday. 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 Has diarrhea. No Fever and SPO2 is good. Last BP was 108/64. Feeling nauseated. States he takes diuretics also. Takes [place] to raise blood pressure. Does the patient have any new or worsening symptoms? ---Yes Will a triage be completed? ---Yes Related visit to physician within the last 2 weeks? ---No Does the PT have any chronic conditions? (i.e. diabetes, asthma, this includes High risk factors for pregnancy, etc.) ---Yes List chronic conditions. ---low pressure, PAH, Is this a behavioral health or substance abuse call? ---No Guidelines Guideline Title Affirmed Question Affirmed Notes Nurse Date/Time [name] Time) Blood Pressure - Low Abdominal pain Lance Hicks, Lance Hicks 01/27/2021 1:13:35 PM Disp. Time [name] Time) Disposition Final User 01/27/2021 1:16:38 PM Go to ED Now Yes Donna Christen, RN, Lance Hicks Disagree/Comply Comply Caller Understands Yes PLEASE NOTE: All timestamps contained within this report are represented as [place] Standard Time. CONFIDENTIALTY NOTICE: This fax transmission is intended only for the addressee. It contains information that is legally privileged, confidential or otherwise protected from use or disclosure. If you are not the intended recipient, you are strictly prohibited from reviewing, disclosing, copying using or disseminating any of this information or taking any action in reliance on or regarding this information. If you have received this fax in error, please notify us immediately by telephone so that we can arrange for its return to [place]. Phone: [phone], Toll-Free: [phone], Fax: [phone] Page: 2 of 2 Call Id: [id number] PreDisposition  InappropriateToAsk Care Advice Given Per Guideline GO TO ED NOW: CARE ADVICE given per Low Blood Pressure (Adult) guideline. Comments User: Tresa Moore, RN Date/Time [name] Time): 01/27/2021 1:13:51 PM Last night SBP was 68 Referrals GO TO FACILITY OTHER - SPECIFY  Pt seen in ED.

## 2021-01-28 NOTE — ED Notes (Signed)
DC instrucitons reviewed with pt.  PT verbalized understanding.  Pt DC.

## 2021-01-31 ENCOUNTER — Other Ambulatory Visit: Payer: Self-pay

## 2021-01-31 ENCOUNTER — Telehealth (INDEPENDENT_AMBULATORY_CARE_PROVIDER_SITE_OTHER): Payer: Medicare Other | Admitting: Family Medicine

## 2021-01-31 ENCOUNTER — Encounter: Payer: Self-pay | Admitting: Family Medicine

## 2021-01-31 VITALS — BP 110/70 | HR 79 | Temp 97.3°F | Ht 69.5 in | Wt 146.0 lb

## 2021-01-31 DIAGNOSIS — J1282 Pneumonia due to coronavirus disease 2019: Secondary | ICD-10-CM | POA: Diagnosis not present

## 2021-01-31 DIAGNOSIS — U071 COVID-19: Secondary | ICD-10-CM

## 2021-01-31 MED ORDER — BENZONATATE 200 MG PO CAPS: 200.0000 mg | ORAL_CAPSULE | Freq: Two times a day (BID) | ORAL | 0 refills | Status: DC | PRN

## 2021-01-31 MED ORDER — AZITHROMYCIN 250 MG PO TABS: ORAL_TABLET | ORAL | 0 refills | Status: DC

## 2021-01-31 NOTE — Progress Notes (Signed)
Virtual Visit via Telephone Note  I connected with Lance Hicks on 01/31/21 at 11:00 AM EST by telephone and verified that I am speaking with the correct person using two identifiers.  Location/ participants in ov  Patient: home alone  Provider: office    I discussed the limitations, risks, security and privacy concerns of performing an evaluation and management service by telephone and the availability of in person appointments. I also discussed with the patient that there may be a patient responsible charge related to this service. The patient expressed understanding and agreed to proceed.   History of Present Illness: Pt was dx with covid 1/25  ---- he developed abd pain and diarrhea and went to ER 2/3 and was dx with covid pneumonia but told to call his pcp for abx  He denies any fever    Observations/Objective: Vitals:   01/31/21 1104  BP: 110/70  Pulse: 79  Temp: (!) 97.3 F (36.3 C)   Pt is in nad No sob   Assessment and Plan: 1. Pneumonia due to COVID-19 virus abx and cough med per orders and pt to make app with covid clinic  Number given to call and pt instructed to call us back if he has an difficulty  - azithromycin (ZITHROMAX Z-PAK) 250 MG tablet; As directed  Dispense: 6 each; Refill: 0 - benzonatate (TESSALON) 200 MG capsule; Take 1 capsule (200 mg total) by mouth 2 (two) times daily as needed for cough.  Dispense: 20 capsule; Refill: 0   Follow Up Instructions:    I discussed the assessment and treatment plan with the patient. The patient was provided an opportunity to ask questions and all were answered. The patient agreed with the plan and demonstrated an understanding of the instructions.   The patient was advised to call back or seek an in-person evaluation if the symptoms worsen or if the condition fails to improve as anticipated.  I provided 25 minutes of non-face-to-face time during this encounter.   Ann Held, DO

## 2021-02-01 NOTE — Progress Notes (Signed)
It is already in there

## 2021-02-03 ENCOUNTER — Ambulatory Visit (INDEPENDENT_AMBULATORY_CARE_PROVIDER_SITE_OTHER): Payer: Medicare Other | Admitting: Nurse Practitioner

## 2021-02-03 ENCOUNTER — Telehealth (HOSPITAL_COMMUNITY): Payer: Self-pay | Admitting: Pharmacist

## 2021-02-03 VITALS — BP 124/79 | HR 67 | Temp 98.0°F

## 2021-02-03 DIAGNOSIS — J1282 Pneumonia due to coronavirus disease 2019: Secondary | ICD-10-CM | POA: Diagnosis not present

## 2021-02-03 DIAGNOSIS — U071 COVID-19: Secondary | ICD-10-CM | POA: Diagnosis not present

## 2021-02-03 MED ORDER — DOXYCYCLINE HYCLATE 100 MG PO TABS: 100.0000 mg | ORAL_TABLET | Freq: Two times a day (BID) | ORAL | 0 refills | Status: DC

## 2021-02-03 NOTE — Patient Instructions (Addendum)
Covid pneumonia:   Stay well hydrated  Stay active  Deep breathing exercises  May take tylenol or fever or pain  May take mucinex  twice daily  Will order doxycycline    Follow up:  Follow up in 1 week or sooner if needed

## 2021-02-03 NOTE — Progress Notes (Signed)
_0  ID: Lance Hicks, male    DOB: Jun 20, 1946, 75 y.o.   MRN: [id number]  Chief Complaint  Patient presents with  . Hospitalization Follow-up    +1/31, ED 01/27/21 - chest xray showed pneumonia    Referring provider: Elise Benne     HPI  Patient presents today for post COVID care clinic visit.  Patient was seen in the ED on 01/27/2021.  Chest x-ray showed pneumonia.  Patient was sent home with supportive measures.  Patient is very upset that he was not prescribed any medications.  He states that he does feel better since his ED visit but is still having shortness of breath and chest congestion. Denies f/c/s, n/v/d, hemoptysis, PND, chest pain or edema.       Allergies  Allergen Reactions  . Protonix [Pantoprazole Sodium]     Gets gassy and starting itching like crazy  . Amoxicillin-Pot Clavulanate Diarrhea    Stomach pain Has patient had a PCN reaction causing immediate rash, facial/tongue/throat swelling, SOB or lightheadedness with hypotension: No Has patient had a PCN reaction causing severe rash involving mucus membranes or skin necrosis: No Has patient had a PCN reaction that required hospitalization: No Has patient had a PCN reaction occurring within the last 10 years: Yes If all of the above answers are "NO", then may proceed with Cephalosporin use.   . Tape Rash and Other (See Comments)    Surgical tape    Immunization History  Administered Date(s) Administered  . Fluad Quad(high Dose 65+) 09/20/2020  . Influenza, High Dose Seasonal PF 09/05/2018  . Influenza,inj,Quad PF,6+ Mos 10/13/2015, 09/19/2016, 10/09/2019  . Moderna Sars-Covid-2 Vaccination 02/19/2020, 03/22/2020, 08/13/2020  . Pneumococcal Conjugate-13 11/28/2017  . Pneumococcal Polysaccharide-23 02/03/2016, 12/06/2018  . Tdap 08/13/2019    Past Medical History:  Diagnosis Date  . Allergic rhinitis 04/28/2016   Last Assessment & Plan:  Continue astelin  . Anemia 07/01/2019  .  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 In light of concurrent left nephrolithiasis, will check CT renal colic next year instead of renal US.   [name] Anticoagulated on Coumadin 01/04/2018  . Anxiety 05/10/2016   Last Assessment & Plan:  Doing well off of zoloft.  . Asthma   . Asymptomatic microscopic hematuria 06/20/2017   Last Assessment & Plan:  Had hematuria workup in [facility] in 2016 which negative CT and cystoscopy. UA with 2+ blood last visit - we discussed recommendation for repeat workup at 5 years or if degree of hematuria progresses.   . Atrial fibrillation ([place]) 03/29/2017  . Atrial flutter ([place]) 03/29/2017  . Backache 12/14/2015   Last Assessment & Plan:  Pain management referral for further evaluation.  . Bilateral pleural effusion 08/09/2017  . Chronic allergic rhinitis 04/28/2016   Last Assessment & Plan:  Continue astelin  . Chronic anticoagulation 03/29/2017  . Chronic atrial fibrillation ([place]) 07/23/2015   Last Assessment & Plan:  Coumadin and metoprolol, cardiology referral to establish care.  . Chronic midline back pain 12/14/2015   Last Assessment & Plan:  Pain management referral for further evaluation.  . Chronic obstructive lung disease ([place]) 06/12/2016   With hypoxia  . Chronic prostatitis 07/23/2015   Last Assessment & Plan:  Has largely resolved since stopping bike riding. Recommend annual DRE AND PSA - will see back 12/2015 for annual screening, given 1st degree fhx. To call office for recurrent prostatitis symptoms.   . Chronic respiratory  failure with hypoxia ([place]) 07/29/2019  . Cirrhosis of liver not due to alcohol ([place]) 07/01/2019  . COPD (chronic obstructive pulmonary disease) ([place]) 06/12/2016  . Coronary arteriosclerosis in native artery 03/29/2017  . Coronary artery disease involving native coronary artery of native heart with angina pectoris ([place]) 03/29/2017  . Cough 10/17/2016   Last Assessment & Plan:  Discussed typical  course for acute viral illness. If symptoms worsen or fail to improve by 7-10d, delayed ATBs, fluids, rest, NSAIDs/APAP prn. Seek care if not improving. Needs earlier INR check due to ATBs.  [name] Dyspnea 02/01/2016   Last Assessment & Plan:  Overall improving, eval by pulm, plan for CT, neg stress test with cardiology. Recent switch to carvedilol due to side effects.  . Encounter for therapeutic drug monitoring 01/06/2019  . Enterococcal bacteremia   . Epidermoid cyst of skin 08/24/2017  . Essential hypertension 12/14/2015   Last Assessment & Plan:  Hypertension control: controlled  Medications: compliant Medication Management: as noted in orders Home blood pressure monitoring recommended additionally as needed for symptoms  The patient's care plan was reviewed and updated. Instructions and counseling were provided regarding patient goals and barriers. He was counseled to adopt a healthy lifestyle. Educational resources and self-management tools have been provided as charted in [facility] list.   . H/O maze procedure 03/29/2017  . [name] 03/29/2017   Overview:  2011  . History of coronary artery bypass graft 03/29/2017  . Hx of CABG 03/29/2017  . Hyperlipidemia 03/29/2017  . Hypersensitivity angiitis ([place]) 10/01/2017  . Hypertensive heart disease 03/29/2017  . Hypertensive heart disease with heart failure ([place]) 03/29/2017  . Hypokalemia due to excessive renal loss of potassium 02/18/2018  . Hypotension, chronic 06/06/2018  . International normalized ratio (INR) raised 07/26/2017  . Kidney stones 07/23/2015   Overview:  x 3  Last Assessment & Plan:  By [place] has left nephrolithiasis, but not visible by KUB. Will check CT renal colic next year to assess both stone burden as well as to surveil AML.   [name] Left ureteral stone 01/23/2018  . Leukocytoclastic vasculitis ([place]) 10/01/2017  . Localized edema 01/04/2018  . Long term (current) use of anticoagulants 03/29/2017  . Lumbar radiculopathy 01/19/2016  .  Maculopapular rash 09/03/2017  . Microscopic hematuria 06/20/2017   Last Assessment & Plan:  Had hematuria workup in [facility] in 2016 which negative CT and cystoscopy. UA with 2+ blood last visit - we discussed recommendation for repeat workup at 5 years or if degree of hematuria progresses.   . Multiple nodules of lung 06/12/2016  . Nasal discharge 02/25/2016   Last Assessment & Plan:  Trial zyrtec and flonase  . Nephrolithiasis 07/23/2015   Overview:  x 3  Last Assessment & Plan:  By [place] has left nephrolithiasis, but not visible by KUB. Will check CT renal colic next year to assess both stone burden as well as to surveil AML.   Overview:  x 3  Last Assessment & Plan:  Has 105m nonobstructing LUP stone - not visible by KUB.  Will check renal [place] - he will contact office sooner if symptomatic.   . Non-sustained ventricular tachycardia ([place] 03/29/2017  . Nonsustained ventricular tachycardia ([place] 03/29/2017  . Other hyperlipidemia 03/29/2017  . Palpitations 10/01/2017  . Paroxysmal atrial fibrillation ([place] 03/29/2017  . Pleural effusion, bilateral 08/09/2017  . Post-nasal drainage 02/25/2016   Last Assessment & Plan:  Trial zyrtec and flonase  . Prostate cancer screening 06/20/2017  Last Assessment & Plan:  Recommend continued annual CaP screening until within 10 years of life expectancy. Given good health and fhx of longevity, would anticipate CaP screening to continue until age 61.  PSA today and again in one year on day of visit.  . Pulmonary arterial hypertension ([place]) 02/20/2018  . Pulmonary edema   . Pulmonary hypertension ([place]) 08/09/2017  . Pulmonary nodules 06/12/2016  . S/P AVR (aortic valve replacement) 03/20/2016  . Strain of deltoid muscle, initial encounter   . Supratherapeutic INR 07/26/2017  . Syncope 03/29/2017  . Syncope and collapse 02/01/2016  . Typical atrial flutter ([place]) 02/01/2016    Tobacco History: Social History   Tobacco Use  Smoking Status Former Smoker  . Packs/day: 2.00  . Years:  34.00  . Pack years: 68.00  . Types: Cigarettes  . Quit date: 07/16/2000  . Years since quitting: 20.5  Smokeless Tobacco Never Used   Counseling given: Yes   Outpatient Encounter Medications as of 02/03/2021  Medication Sig  . doxycycline (VIBRA-TABS) 100 MG tablet Take 1 tablet (100 mg total) by mouth 2 (two) times daily.  [name] acetaminophen (TYLENOL) 650 MG CR tablet Take 1 tablet (650 mg total) by mouth every 8 (eight) hours as needed for pain. Not to use with norco since combined with tylenol.  [name] albuterol (VENTOLIN HFA) 108 (90 Base) MCG/ACT inhaler Inhale 2 puffs into the lungs every 6 (six) hours as needed for wheezing or shortness of breath.  [name] aspirin EC 81 MG tablet Take 81 mg by mouth daily.  [name] azithromycin (ZITHROMAX Z-PAK) 250 MG tablet As directed  . B Complex-C (B-COMPLEX WITH VITAMIN C) tablet Take 1 tablet by mouth daily.   . benzonatate (TESSALON) 200 MG capsule Take 1 capsule (200 mg total) by mouth 2 (two) times daily as needed for cough.  . Cholecalciferol (VITAMIN D) 125 MCG (5000 UT) CAPS Take 1 capsule by mouth daily.  . Coenzyme Q10 (COQ10 PO) Take 1 capsule by mouth daily.  . cyclobenzaprine (FLEXERIL) 5 MG tablet Take 1 tablet (5 mg total) by mouth 3 (three) times daily as needed for muscle spasms.  . fluticasone (FLONASE) 50 MCG/ACT nasal spray Place 2 sprays into both nostrils daily.  [name] HYDROcodone-acetaminophen (NORCO) 5-325 MG tablet Take 2 tablets by mouth every 4 (four) hours as needed.  [name] KRILL OIL PO Take 1 capsule by mouth daily.  . Magnesium Gluconate (MAGNESIUM 27 PO) Take 1 tablet by mouth every other day.   . metolazone (ZAROXOLYN) 2.5 MG tablet Take 2.5 mg by mouth as needed.  . midodrine (PROAMATINE) 10 MG tablet Take 1 tablet (10 mg total) by mouth 3 (three) times daily with meals.  . OPSUMIT 10 MG tablet TAKE 1 TABLET DAILY  . potassium chloride SA (KLOR-CON) 20 MEQ tablet Take 2 tablets (40 mEq total) by mouth 2 (two) times daily.  . rosuvastatin  (CRESTOR) 5 MG tablet TAKE 1 TABLET(5 MG) BY MOUTH DAILY  . Selexipag (UPTRAVI) 400 MCG TABS Take 400 mcg by mouth in the morning.  . Selexipag 200 MCG TABS Take 1 tablet (200 mcg total) by mouth every evening.  [name] spironolactone (ALDACTONE) 25 MG tablet TAKE 1/2 OF A TABLET BY MOUTH ONCE DAILY.  . tadalafil, PAH, (ADCIRCA) 20 MG tablet TAKE 2 TABLETS ONCE A DAY.  [name] torsemide (DEMADEX) 20 MG tablet Take 60 mg daily alternating with 40 mg daily  . Vitamin D, Ergocalciferol, (DRISDOL) 1.25 MG (50000 UNIT) CAPS capsule TAKE 1 CAPSULE  BY MOUTH EVERY 7 DAYS  . warfarin (COUMADIN) 5 MG tablet TAKE 1/2 TO 1 TABLET DAILY AS DIRECTED BY COUMADIN CLINIC   No facility-administered encounter medications on file as of 02/03/2021.     Review of Systems  Review of Systems  Constitutional: Negative.   HENT: Positive for congestion.   Respiratory: Positive for cough and shortness of breath.   Cardiovascular: Negative.  Negative for chest pain, palpitations and leg swelling.  Gastrointestinal: Negative.   Allergic/Immunologic: Negative.   Neurological: Negative.   Psychiatric/Behavioral: Negative.        Physical Exam  BP 124/79   Pulse 67   Temp 98 F (36.7 C)   SpO2 95% Comment: RA  Wt Readings from Last 5 Encounters:  01/31/21 146 lb (66.2 kg)  01/24/21 146 lb (66.2 kg)  10/28/20 149 lb 3.2 oz (67.7 kg)  10/26/20 151 lb 3.2 oz (68.6 kg)  10/12/20 143 lb (64.9 kg)     Physical Exam Vitals and nursing note reviewed.  Constitutional:      General: He is not in acute distress.    Appearance: He is well-developed and well-nourished.  Cardiovascular:     Rate and Rhythm: Normal rate and regular rhythm.  Pulmonary:     Effort: Pulmonary effort is normal.     Breath sounds: Normal breath sounds.  Skin:    General: Skin is warm and dry.  Neurological:     Mental Status: He is alert and oriented to person, place, and time.  Psychiatric:        Mood and Affect: Mood and affect and  mood normal.        Behavior: Behavior normal.      Imaging: CT ABDOMEN PELVIS WO CONTRAST  Result Date: 01/27/2021 CLINICAL DATA:  Diarrhea. Abdominal pain with nausea. Flank pain. COVID positive. EXAM: CT ABDOMEN AND PELVIS WITHOUT CONTRAST TECHNIQUE: Multidetector CT imaging of the abdomen and pelvis was performed following the standard protocol without IV contrast. COMPARISON:  CT 06/19/2019 FINDINGS: Lower chest: Cardiomegaly. Coronary artery calcifications. Aortic valvular calcifications. Sequela of prior granulomatous disease with calcified nodules and right infrahilar calcified node. Mild patchy ground-glass opacity in the lingula and to a lesser extent lower lobes consistent with COVID pneumonia. Subpleural nodule in the right middle lobe measures 9 mm, series 5, image 4. This is unchanged dating back to 05/16/2018 for 21 month stability. Hepatobiliary: No focal hepatic abnormality on noncontrast exam. The enhancing 0.5 cm lesion in the right lobe on prior exam is not seen in the absence of IV contrast. Gallbladder surgically absent. No biliary dilatation. Pancreas: Mild atrophy.  No ductal dilatation or inflammation. Spleen: Normal in size without focal abnormality. Adrenals/Urinary Tract: Normal adrenal glands. No hydronephrosis or renal calculi. Moderate density lesions in both kidneys are most consistent with cysts, though incompletely characterized on noncontrast exam. Probable hyperdense cyst in the posterior mid right kidney. Additional tiny cortical hypodensities in both kidneys are too small to characterize. Urinary bladder is unremarkable. Stomach/Bowel: Decompressed stomach. Normal positioning of the duodenum. No small bowel obstruction or inflammation. Normal appendix. Diverticulosis from the distal transverse colon distally. Diverticular changes are prominent in the sigmoid. No evidence of diverticulitis. Vascular/Lymphatic: Advanced aortic atherosclerosis. Mild aortic tortuosity. No  aortic aneurysm. No bulky abdominopelvic adenopathy. Reproductive: Prostate is unremarkable. Other: No ascites or free air.  No abdominal wall hernia. Musculoskeletal: Degenerative change in the spine. Chronic bilateral L5 pars interarticularis defects. Stable grade 1 anterolisthesis of L4 on L5. There are no  acute or suspicious osseous abnormalities. IMPRESSION: 1. No acute abnormality in the abdomen/pelvis. 2. Colonic diverticulosis without diverticulitis. 3. Ground-glass opacities in the lingula and to a lesser extent lower lobes consistent with COVID pneumonia. 4. Right middle lobe pulmonary nodule measuring 9 mm demonstrates 21 month stability, near certainly benign. Aortic Atherosclerosis (ICD10-I70.0). Electronically Signed   By: Keith Rake M.D.   On: 01/27/2021 21:15   DG Chest Portable 1 View  Result Date: 01/27/2021 CLINICAL DATA:  COVID-19 positivity EXAM: PORTABLE CHEST 1 VIEW COMPARISON:  09/17/2020 FINDINGS: Cardiac shadow is enlarged. Postsurgical changes are again noted and stable. Patchy chronic scarring is noted bilaterally similar to that seen on prior CT examination. Multiple calcified granulomas are seen. Patchy airspace disease is noted in the left lung base consistent with the given clinical history. No acute bony abnormality is noted. IMPRESSION: Patchy airspace opacities in the left base consistent with the given clinical history of COVID-19 positivity. Chronic scarring with calcified granulomas similar to that seen on prior CT. Electronically Signed   By: Inez Catalina M.D.   On: 01/27/2021 21:03     Assessment & Plan:   Pneumonia due to COVID-19 virus Stay well hydrated  Stay active  Deep breathing exercises  May take tylenol or fever or pain  May take mucinex  twice daily  Will order doxycycline    Follow up:  Follow up in 1 week or sooner if needed      Fenton Foy, NP 02/07/2021

## 2021-02-03 NOTE — Telephone Encounter (Signed)
Patient Advocate Encounter   Received notification from [facility] that prior authorizations for tadalafil, Opsumit and Malvin Johns are required.   PAs submitted on CoverMyMeds Status is pending   Will continue to follow.   Audry Riles, PharmD, BCPS, BCCP, CPP Heart Failure Clinic Pharmacist [phone]

## 2021-02-04 NOTE — Telephone Encounter (Signed)
Advanced Heart Failure Patient Advocate Encounter  Prior Authorizations for tadalafil, Opsumit and Uptravi have been approved.    Effective dates: 02/03/21 through 12/24/21  Audry Riles, PharmD, BCPS, BCCP, CPP Heart Failure Clinic Pharmacist [phone]

## 2021-02-07 ENCOUNTER — Telehealth: Payer: Self-pay | Admitting: Medical

## 2021-02-07 DIAGNOSIS — J1282 Pneumonia due to coronavirus disease 2019: Secondary | ICD-10-CM

## 2021-02-07 DIAGNOSIS — U071 COVID-19: Secondary | ICD-10-CM | POA: Insufficient documentation

## 2021-02-07 HISTORY — DX: COVID-19: U07.1

## 2021-02-07 HISTORY — DX: Pneumonia due to coronavirus disease 2019: J12.82

## 2021-02-07 NOTE — Assessment & Plan Note (Signed)
Stay well hydrated  Stay active  Deep breathing exercises  May take tylenol or fever or pain  May take mucinex  twice daily  Will order doxycycline    Follow up:  Follow up in 1 week or sooner if needed

## 2021-02-07 NOTE — Telephone Encounter (Signed)
Pt diagnosed with covid on 01/18/2021. He is having persisting shortness of breath. Never hospitalized. He can be seen in the office. Please schedule office visit with me in person.

## 2021-02-08 NOTE — Telephone Encounter (Signed)
Patient has an appointment with the covid clinic next week , do you still want to see him

## 2021-02-08 NOTE — Telephone Encounter (Signed)
I would be willing to see pt this week if he wants this week  or  next week.   Offer appointment. I would not cancel covid clinic appointment yet. If I see him this week and doing well then may advise stop otherwise keep appt.

## 2021-02-09 ENCOUNTER — Other Ambulatory Visit (HOSPITAL_COMMUNITY): Payer: Self-pay | Admitting: Cardiology

## 2021-02-09 NOTE — Telephone Encounter (Signed)
Lance Hicks has no openings for the rest of week , patient has appointment on Monday(02/14/21) , did not call pt to offer an appointment due to those circumstances  .

## 2021-02-14 ENCOUNTER — Ambulatory Visit (INDEPENDENT_AMBULATORY_CARE_PROVIDER_SITE_OTHER): Payer: Medicare Other | Admitting: Nurse Practitioner

## 2021-02-14 VITALS — BP 121/68 | HR 61 | Temp 97.1°F

## 2021-02-14 DIAGNOSIS — J1282 Pneumonia due to coronavirus disease 2019: Secondary | ICD-10-CM

## 2021-02-14 DIAGNOSIS — U071 COVID-19: Secondary | ICD-10-CM | POA: Diagnosis not present

## 2021-02-14 MED ORDER — AMOXICILLIN-POT CLAVULANATE 875-125 MG PO TABS: 1.0000 | ORAL_TABLET | Freq: Two times a day (BID) | ORAL | 0 refills | Status: AC

## 2021-02-14 NOTE — Patient Instructions (Addendum)
Covid pneumonia Cough:   Stay well hydrated  Stay active  Deep breathing exercises  May take tylenol or fever or pain  May take mucinex twice daily  Will order chest x ray: 02/24/21  [facility] [address], [place] [address] [phone] MON - FRI 8:00 AM - 4:00 PM - WALK IN   Follow up:  Follow up in 2 weeks or sooner if needed

## 2021-02-14 NOTE — Progress Notes (Signed)
_0  ID: Lance Hicks, male    DOB: [birth year], 75 y.o.   MRN: [id number]  Chief Complaint  Patient presents with  . Covid Positive    Follow up - improving since last visit, still having chest congestion and cough    Referring provider: Elise Benne   HPI  Patient presents today for post COVID care clinic visit follow-up.  Patient was last seen in our office on 02/03/2021.  For follow-up on COVID pneumonia.  Patient was prescribed doxycycline.  He states that he is improving but is still having significant chest congestion, shortness of breath, cough that is productive of yellow sputum. Denies f/c/s, n/v/d, hemoptysis, PND, chest pain or edema.      Allergies  Allergen Reactions  . Protonix [Pantoprazole Sodium]     Gets gassy and starting itching like crazy  . Amoxicillin-Pot Clavulanate Diarrhea    Stomach pain Has patient had a PCN reaction causing immediate rash, facial/tongue/throat swelling, SOB or lightheadedness with hypotension: No Has patient had a PCN reaction causing severe rash involving mucus membranes or skin necrosis: No Has patient had a PCN reaction that required hospitalization: No Has patient had a PCN reaction occurring within the last 10 years: Yes If all of the above answers are "NO", then may proceed with Cephalosporin use.   . Tape Rash and Other (See Comments)    Surgical tape    Immunization History  Administered Date(s) Administered  . Fluad Quad(high Dose 65+) 09/20/2020  . Influenza, High Dose Seasonal PF 09/05/2018  . Influenza,inj,Quad PF,6+ Mos 10/13/2015, 09/19/2016, 10/09/2019  . Moderna Sars-Covid-2 Vaccination 02/19/2020, 03/22/2020, 08/13/2020  . Pneumococcal Conjugate-13 11/28/2017  . Pneumococcal Polysaccharide-23 02/03/2016, 12/06/2018  . Tdap 08/13/2019    Past Medical History:  Diagnosis Date  . Allergic rhinitis 04/28/2016   Last Assessment & Plan:  Continue astelin  . Anemia 07/01/2019  . Angiomyolipoma  of right kidney 05/03/2016   Last Assessment & Plan:  Stable in size on annual imaging. In light of concurrent left nephrolithiasis, will check CT renal colic next year instead of renal US.   [name] Anticoagulated on Coumadin 01/04/2018  . Anxiety 05/10/2016   Last Assessment & Plan:  Doing well off of zoloft.  . Asthma   . Asymptomatic microscopic hematuria 06/20/2017   Last Assessment & Plan:  Had hematuria workup in [facility] in [facility] which negative CT and cystoscopy. UA with 2+ blood last visit - we discussed recommendation for repeat workup at 5 years or if degree of hematuria progresses.   . Atrial fibrillation ([place]) 03/29/2017  . Atrial flutter ([place]) 03/29/2017  . Backache 12/14/2015   Last Assessment & Plan:  Pain management referral for further evaluation.  . Bilateral pleural effusion 08/09/2017  . Chronic allergic rhinitis 04/28/2016   Last Assessment & Plan:  Continue astelin  . Chronic anticoagulation 03/29/2017  . Chronic atrial fibrillation ([place]) 07/23/2015   Last Assessment & Plan:  Coumadin and metoprolol, cardiology referral to establish care.  . Chronic midline back pain 12/14/2015   Last Assessment & Plan:  Pain management referral for further evaluation.  . Chronic obstructive lung disease ([place]) 06/12/2016   With hypoxia  . Chronic prostatitis 07/23/2015   Last Assessment & Plan:  Has largely resolved since stopping bike riding. Recommend annual DRE AND PSA - will see back 12/2015 for annual screening, given 1st degree fhx. To call office for recurrent prostatitis symptoms.   . Chronic respiratory failure with hypoxia ([place]) 07/29/2019  . Cirrhosis  of liver not due to alcohol ([place]) 07/01/2019  . COPD (chronic obstructive pulmonary disease) ([place]) 06/12/2016  . Coronary arteriosclerosis in native artery 03/29/2017  . Coronary artery disease involving native coronary artery of native heart with angina pectoris ([place]) 03/29/2017  . Cough 10/17/2016   Last Assessment & Plan:  Discussed typical course for acute  viral illness. If symptoms worsen or fail to improve by 7-10d, delayed ATBs, fluids, rest, NSAIDs/APAP prn. Seek care if not improving. Needs earlier INR check due to ATBs.  [name] Dyspnea 02/01/2016   Last Assessment & Plan:  Overall improving, eval by pulm, plan for CT, neg stress test with cardiology. Recent switch to carvedilol due to side effects.  . Encounter for therapeutic drug monitoring 01/06/2019  . Enterococcal bacteremia   . Epidermoid cyst of skin 08/24/2017  . Essential hypertension 12/14/2015   Last Assessment & Plan:  Hypertension control: controlled  Medications: compliant Medication Management: as noted in orders Home blood pressure monitoring recommended additionally as needed for symptoms  The patient's care plan was reviewed and updated. Instructions and counseling were provided regarding patient goals and barriers. He was counseled to adopt a healthy lifestyle. Educational resources and self-management tools have been provided as charted in [facility] list.   . H/O maze procedure 03/29/2017  . [name] 03/29/2017   Overview:  2011  . History of coronary artery bypass graft 03/29/2017  . Hx of CABG 03/29/2017  . Hyperlipidemia 03/29/2017  . Hypersensitivity angiitis ([place]) 10/01/2017  . Hypertensive heart disease 03/29/2017  . Hypertensive heart disease with heart failure ([place]) 03/29/2017  . Hypokalemia due to excessive renal loss of potassium 02/18/2018  . Hypotension, chronic 06/06/2018  . International normalized ratio (INR) raised 07/26/2017  . Kidney stones 07/23/2015   Overview:  x 3  Last Assessment & Plan:  By [place] has left nephrolithiasis, but not visible by KUB. Will check CT renal colic next year to assess both stone burden as well as to surveil AML.   [name] Left ureteral stone 01/23/2018  . Leukocytoclastic vasculitis ([place]) 10/01/2017  . Localized edema 01/04/2018  . Long term (current) use of anticoagulants 03/29/2017  . Lumbar radiculopathy 01/19/2016  . Maculopapular rash  09/03/2017  . Microscopic hematuria 06/20/2017   Last Assessment & Plan:  Had hematuria workup in [facility] in 2016 which negative CT and cystoscopy. UA with 2+ blood last visit - we discussed recommendation for repeat workup at 5 years or if degree of hematuria progresses.   . Multiple nodules of lung 06/12/2016  . Nasal discharge 02/25/2016   Last Assessment & Plan:  Trial zyrtec and flonase  . Nephrolithiasis 07/23/2015   Overview:  x 3  Last Assessment & Plan:  By [place] has left nephrolithiasis, but not visible by KUB. Will check CT renal colic next year to assess both stone burden as well as to surveil AML.   Overview:  x 3  Last Assessment & Plan:  Has 69m nonobstructing LUP stone - not visible by KUB.  Will check renal [place] - he will contact office sooner if symptomatic.   . Non-sustained ventricular tachycardia ([place] 03/29/2017  . Nonsustained ventricular tachycardia ([place] 03/29/2017  . Other hyperlipidemia 03/29/2017  . Palpitations 10/01/2017  . Paroxysmal atrial fibrillation ([place] 03/29/2017  . Pleural effusion, bilateral 08/09/2017  . Post-nasal drainage 02/25/2016   Last Assessment & Plan:  Trial zyrtec and flonase  . Prostate cancer screening 06/20/2017   Last Assessment & Plan:  Recommend continued  annual CaP screening until within 10 years of life expectancy. Given good health and fhx of longevity, would anticipate CaP screening to continue until age 53.  PSA today and again in one year on day of visit.  . Pulmonary arterial hypertension ([place]) 02/20/2018  . Pulmonary edema   . Pulmonary hypertension ([place]) 08/09/2017  . Pulmonary nodules 06/12/2016  . S/P AVR (aortic valve replacement) 03/20/2016  . Strain of deltoid muscle, initial encounter   . Supratherapeutic INR 07/26/2017  . Syncope 03/29/2017  . Syncope and collapse 02/01/2016  . Typical atrial flutter ([place]) 02/01/2016    Tobacco History: Social History   Tobacco Use  Smoking Status Former Smoker  . Packs/day: 2.00  . Years: 34.00  . Pack  years: 68.00  . Types: Cigarettes  . Quit date: 07/16/2000  . Years since quitting: 20.5  Smokeless Tobacco Never Used   Counseling given: Yes   Outpatient Encounter Medications as of 02/14/2021  Medication Sig  . amoxicillin-clavulanate (AUGMENTIN) 875-125 MG tablet Take 1 tablet by mouth 2 (two) times daily for 10 days.  [name] acetaminophen (TYLENOL) 650 MG CR tablet Take 1 tablet (650 mg total) by mouth every 8 (eight) hours as needed for pain. Not to use with norco since combined with tylenol.  [name] albuterol (VENTOLIN HFA) 108 (90 Base) MCG/ACT inhaler Inhale 2 puffs into the lungs every 6 (six) hours as needed for wheezing or shortness of breath.  [name] aspirin EC 81 MG tablet Take 81 mg by mouth daily.  . B Complex-C (B-COMPLEX WITH VITAMIN C) tablet Take 1 tablet by mouth daily.   . benzonatate (TESSALON) 200 MG capsule Take 1 capsule (200 mg total) by mouth 2 (two) times daily as needed for cough.  . Cholecalciferol (VITAMIN D) 125 MCG (5000 UT) CAPS Take 1 capsule by mouth daily.  . Coenzyme Q10 (COQ10 PO) Take 1 capsule by mouth daily.  . cyclobenzaprine (FLEXERIL) 5 MG tablet Take 1 tablet (5 mg total) by mouth 3 (three) times daily as needed for muscle spasms.  . fluticasone (FLONASE) 50 MCG/ACT nasal spray Place 2 sprays into both nostrils daily.  [name] HYDROcodone-acetaminophen (NORCO) 5-325 MG tablet Take 2 tablets by mouth every 4 (four) hours as needed.  [name] KRILL OIL PO Take 1 capsule by mouth daily.  . Magnesium Gluconate (MAGNESIUM 27 PO) Take 1 tablet by mouth every other day.   . metolazone (ZAROXOLYN) 2.5 MG tablet Take 2.5 mg by mouth as needed.  . midodrine (PROAMATINE) 10 MG tablet Take 1 tablet (10 mg total) by mouth 3 (three) times daily with meals.  . OPSUMIT 10 MG tablet TAKE 1 TABLET DAILY  . potassium chloride SA (KLOR-CON) 20 MEQ tablet Take 2 tablets (40 mEq total) by mouth 2 (two) times daily.  . rosuvastatin (CRESTOR) 5 MG tablet TAKE 1 TABLET(5 MG) BY MOUTH DAILY  .  Selexipag (UPTRAVI) 400 MCG TABS Take 400 mcg by mouth in the morning.  . Selexipag 200 MCG TABS Take 1 tablet (200 mcg total) by mouth every evening.  [name] spironolactone (ALDACTONE) 25 MG tablet TAKE 1/2 TABLET BY MOUTH EVERY DAY  . tadalafil, PAH, (ADCIRCA) 20 MG tablet TAKE 2 TABLETS ONCE A DAY.  [name] torsemide (DEMADEX) 20 MG tablet Take 60 mg daily alternating with 40 mg daily  . Vitamin D, Ergocalciferol, (DRISDOL) 1.25 MG (50000 UNIT) CAPS capsule TAKE 1 CAPSULE BY MOUTH EVERY 7 DAYS  . warfarin (COUMADIN) 5 MG tablet TAKE 1/2 TO 1 TABLET DAILY AS  DIRECTED BY COUMADIN CLINIC  . [DISCONTINUED] azithromycin (ZITHROMAX Z-PAK) 250 MG tablet As directed  . [DISCONTINUED] doxycycline (VIBRA-TABS) 100 MG tablet Take 1 tablet (100 mg total) by mouth 2 (two) times daily.   No facility-administered encounter medications on file as of 02/14/2021.     Review of Systems  Review of Systems  Constitutional: Negative.  Negative for fatigue and fever.  HENT: Negative.   Respiratory: Positive for cough and shortness of breath.   Cardiovascular: Negative.  Negative for chest pain, palpitations and leg swelling.  Gastrointestinal: Negative.   Allergic/Immunologic: Negative.   Neurological: Negative.   Psychiatric/Behavioral: Negative.        Physical Exam  BP 121/68   Pulse 61   Temp (!) 97.1 F (36.2 C)   SpO2 96% Comment: RA  Wt Readings from Last 5 Encounters:  01/31/21 146 lb (66.2 kg)  01/24/21 146 lb (66.2 kg)  10/28/20 149 lb 3.2 oz (67.7 kg)  10/26/20 151 lb 3.2 oz (68.6 kg)  10/12/20 143 lb (64.9 kg)     Physical Exam Vitals and nursing note reviewed.  Constitutional:      General: He is not in acute distress.    Appearance: He is well-developed and well-nourished.  Cardiovascular:     Rate and Rhythm: Normal rate and regular rhythm.  Pulmonary:     Effort: Pulmonary effort is normal.     Breath sounds: Normal breath sounds.  Musculoskeletal:     Right lower leg: No  edema.     Left lower leg: No edema.  Skin:    General: Skin is warm and dry.  Neurological:     Mental Status: He is alert and oriented to person, place, and time.  Psychiatric:        Mood and Affect: Mood and affect and mood normal.        Behavior: Behavior normal.      Imaging: CT ABDOMEN PELVIS WO CONTRAST  Result Date: 01/27/2021 CLINICAL DATA:  Diarrhea. Abdominal pain with nausea. Flank pain. COVID positive. EXAM: CT ABDOMEN AND PELVIS WITHOUT CONTRAST TECHNIQUE: Multidetector CT imaging of the abdomen and pelvis was performed following the standard protocol without IV contrast. COMPARISON:  CT 06/19/2019 FINDINGS: Lower chest: Cardiomegaly. Coronary artery calcifications. Aortic valvular calcifications. Sequela of prior granulomatous disease with calcified nodules and right infrahilar calcified node. Mild patchy ground-glass opacity in the lingula and to a lesser extent lower lobes consistent with COVID pneumonia. Subpleural nodule in the right middle lobe measures 9 mm, series 5, image 4. This is unchanged dating back to 05/16/2018 for 21 month stability. Hepatobiliary: No focal hepatic abnormality on noncontrast exam. The enhancing 0.5 cm lesion in the right lobe on prior exam is not seen in the absence of IV contrast. Gallbladder surgically absent. No biliary dilatation. Pancreas: Mild atrophy.  No ductal dilatation or inflammation. Spleen: Normal in size without focal abnormality. Adrenals/Urinary Tract: Normal adrenal glands. No hydronephrosis or renal calculi. Moderate density lesions in both kidneys are most consistent with cysts, though incompletely characterized on noncontrast exam. Probable hyperdense cyst in the posterior mid right kidney. Additional tiny cortical hypodensities in both kidneys are too small to characterize. Urinary bladder is unremarkable. Stomach/Bowel: Decompressed stomach. Normal positioning of the duodenum. No small bowel obstruction or inflammation. Normal  appendix. Diverticulosis from the distal transverse colon distally. Diverticular changes are prominent in the sigmoid. No evidence of diverticulitis. Vascular/Lymphatic: Advanced aortic atherosclerosis. Mild aortic tortuosity. No aortic aneurysm. No bulky abdominopelvic adenopathy. Reproductive: Prostate  is unremarkable. Other: No ascites or free air.  No abdominal wall hernia. Musculoskeletal: Degenerative change in the spine. Chronic bilateral L5 pars interarticularis defects. Stable grade 1 anterolisthesis of L4 on L5. There are no acute or suspicious osseous abnormalities. IMPRESSION: 1. No acute abnormality in the abdomen/pelvis. 2. Colonic diverticulosis without diverticulitis. 3. Ground-glass opacities in the lingula and to a lesser extent lower lobes consistent with COVID pneumonia. 4. Right middle lobe pulmonary nodule measuring 9 mm demonstrates 21 month stability, near certainly benign. Aortic Atherosclerosis (ICD10-I70.0). Electronically Signed   By: Keith Rake M.D.   On: 01/27/2021 21:15   DG Chest Portable 1 View  Result Date: 01/27/2021 CLINICAL DATA:  COVID-19 positivity EXAM: PORTABLE CHEST 1 VIEW COMPARISON:  09/17/2020 FINDINGS: Cardiac shadow is enlarged. Postsurgical changes are again noted and stable. Patchy chronic scarring is noted bilaterally similar to that seen on prior CT examination. Multiple calcified granulomas are seen. Patchy airspace disease is noted in the left lung base consistent with the given clinical history. No acute bony abnormality is noted. IMPRESSION: Patchy airspace opacities in the left base consistent with the given clinical history of COVID-19 positivity. Chronic scarring with calcified granulomas similar to that seen on prior CT. Electronically Signed   By: Inez Catalina M.D.   On: 01/27/2021 21:03     Assessment & Plan:   Pneumonia due to COVID-19 virus Cough:   Stay well hydrated  Stay active  Deep breathing exercises  May take tylenol or  fever or pain  May take mucinex twice daily  Will order chest x ray: 02/24/21  [facility] [address], [place] [address] [phone] MON - FRI 8:00 AM - 4:00 PM - WALK IN   Follow up:  Follow up in 2 weeks or sooner if needed       Fenton Foy, NP 02/14/2021

## 2021-02-14 NOTE — Assessment & Plan Note (Signed)
Cough:   Stay well hydrated  Stay active  Deep breathing exercises  May take tylenol or fever or pain  May take mucinex twice daily  Will order chest x ray: 02/24/21  [facility] [address], [place] [address] [phone] MON - FRI 8:00 AM - 4:00 PM - WALK IN   Follow up:  Follow up in 2 weeks or sooner if needed

## 2021-02-15 ENCOUNTER — Ambulatory Visit (INDEPENDENT_AMBULATORY_CARE_PROVIDER_SITE_OTHER): Payer: Medicare Other

## 2021-02-15 ENCOUNTER — Other Ambulatory Visit: Payer: Self-pay

## 2021-02-15 DIAGNOSIS — I4892 Unspecified atrial flutter: Secondary | ICD-10-CM

## 2021-02-15 DIAGNOSIS — Z7901 Long term (current) use of anticoagulants: Secondary | ICD-10-CM

## 2021-02-15 LAB — POCT INR: INR: 2.9 (ref 2.0–3.0)

## 2021-02-15 NOTE — Patient Instructions (Signed)
Continue taking 1/2 tablet daily except 1 tablet on Sunday and  Wednesdays.  Please call our office with any medication changes or concerns [phone]. Return in 2 weeks for INR check.

## 2021-02-16 ENCOUNTER — Telehealth (HOSPITAL_COMMUNITY): Payer: Self-pay | Admitting: Pharmacist

## 2021-02-16 MED ORDER — TADALAFIL (PAH) 20 MG PO TABS
40.0000 mg | ORAL_TABLET | Freq: Every day | ORAL | 11 refills | Status: DC
Start: 2021-02-16 — End: 2021-03-21

## 2021-02-16 NOTE — Telephone Encounter (Signed)
Received message from [place] that patient will have to start filling tadalafil at [facility]. He will continue to fill Uptravi and Opsumit through [facility].   Audry Riles, PharmD, BCPS, BCCP, CPP Heart Failure Clinic Pharmacist [phone]

## 2021-02-24 ENCOUNTER — Other Ambulatory Visit: Payer: Self-pay

## 2021-02-24 ENCOUNTER — Ambulatory Visit
Admission: RE | Admit: 2021-02-24 | Discharge: 2021-02-24 | Disposition: A | Discharge status: Home / Self Care | Payer: PRIVATE HEALTH INSURANCE | Source: Ambulatory Visit | Attending: Nurse Practitioner | Admitting: Nurse Practitioner

## 2021-02-24 DIAGNOSIS — J1282 Pneumonia due to coronavirus disease 2019: Secondary | ICD-10-CM

## 2021-02-24 DIAGNOSIS — U071 COVID-19: Secondary | ICD-10-CM

## 2021-02-28 ENCOUNTER — Other Ambulatory Visit: Payer: Self-pay | Admitting: Nurse Practitioner

## 2021-02-28 MED ORDER — PREDNISONE 5 MG PO TABS: 5.0000 mg | ORAL_TABLET | Freq: Every day | ORAL | 0 refills | Status: AC

## 2021-02-28 MED ORDER — CEFDINIR 300 MG PO CAPS: 300.0000 mg | ORAL_CAPSULE | Freq: Two times a day (BID) | ORAL | 0 refills | Status: AC

## 2021-03-01 ENCOUNTER — Ambulatory Visit (INDEPENDENT_AMBULATORY_CARE_PROVIDER_SITE_OTHER): Payer: Medicare Other

## 2021-03-01 ENCOUNTER — Other Ambulatory Visit: Payer: Self-pay

## 2021-03-01 DIAGNOSIS — I4892 Unspecified atrial flutter: Secondary | ICD-10-CM | POA: Diagnosis not present

## 2021-03-01 DIAGNOSIS — Z7901 Long term (current) use of anticoagulants: Secondary | ICD-10-CM | POA: Diagnosis not present

## 2021-03-01 DIAGNOSIS — Z5181 Encounter for therapeutic drug level monitoring: Secondary | ICD-10-CM

## 2021-03-01 LAB — POCT INR: INR: 1.8 — AB (ref 2.0–3.0)

## 2021-03-01 NOTE — Patient Instructions (Signed)
Take 1.5 tablets tonight only and then Continue taking 1/2 tablet daily except 1 tablet on Sunday and Wednesdays.  Please call our office with any medication changes or concerns [phone]. Return in 3 weeks for INR check.

## 2021-03-11 ENCOUNTER — Other Ambulatory Visit (HOSPITAL_COMMUNITY): Payer: Self-pay | Admitting: *Deleted

## 2021-03-11 MED ORDER — POTASSIUM CHLORIDE CRYS ER 20 MEQ PO TBCR: 40.0000 meq | EXTENDED_RELEASE_TABLET | Freq: Two times a day (BID) | ORAL | 3 refills | Status: DC

## 2021-03-14 DIAGNOSIS — M542 Cervicalgia: Secondary | ICD-10-CM | POA: Diagnosis not present

## 2021-03-14 DIAGNOSIS — M25511 Pain in right shoulder: Secondary | ICD-10-CM | POA: Diagnosis not present

## 2021-03-18 ENCOUNTER — Other Ambulatory Visit: Payer: Self-pay

## 2021-03-18 DIAGNOSIS — J45909 Unspecified asthma, uncomplicated: Secondary | ICD-10-CM | POA: Insufficient documentation

## 2021-03-21 ENCOUNTER — Encounter: Payer: Self-pay | Admitting: Cardiology

## 2021-03-21 ENCOUNTER — Other Ambulatory Visit: Payer: Self-pay

## 2021-03-21 ENCOUNTER — Ambulatory Visit (INDEPENDENT_AMBULATORY_CARE_PROVIDER_SITE_OTHER): Payer: PRIVATE HEALTH INSURANCE | Admitting: Cardiology

## 2021-03-21 VITALS — BP 110/70 | HR 66 | Ht 69.5 in | Wt 148.0 lb

## 2021-03-21 DIAGNOSIS — Z951 Presence of aortocoronary bypass graft: Secondary | ICD-10-CM

## 2021-03-21 DIAGNOSIS — Z952 Presence of prosthetic heart valve: Secondary | ICD-10-CM | POA: Diagnosis not present

## 2021-03-21 DIAGNOSIS — I25119 Atherosclerotic heart disease of native coronary artery with unspecified angina pectoris: Secondary | ICD-10-CM | POA: Diagnosis not present

## 2021-03-21 DIAGNOSIS — I483 Typical atrial flutter: Secondary | ICD-10-CM | POA: Diagnosis not present

## 2021-03-21 NOTE — Progress Notes (Signed)
Cardiology Office Note:    Date:  03/21/2021   ID:  Lance Hicks, Lance Hicks [birth year], MRN [id number]  PCP:  [name], PA-C  Cardiologist:  Jenne Campus, MD    Referring MD: Elise Benne   Chief Complaint  Patient presents with  . Follow-up  Had Covid about a month ago still recovering  History of Present Illness:    Lance Hicks is a 75 y.o. male with past medical history significant for coronary artery disease, status post coronary bypass graft with aortic valve replacement with 25 mm of [employer] valve done in 2010, the same time he did have maze procedure as well as left atrial appendage excision.  He does have history of pulmonary hypertension, chronic kidney failure, permanent what appears to be atypical atrial flutter. He comes today 2 months for regular follow-up about a month ago he suffered from COVID-19 infection.  It was complicated with pneumonia he did have a rough course but recovering nicely.  He exercised on the regular basis about 4 times a week and he is getting ready to go to gym today when asked what he does at the gym he said he exercised on the treadmill and elliptical also stationary bike.  He said he is gradually getting better and getting back to the shape that he was on before.  Denies have any cardiac complaints there is no palpitations no dizziness no passing out.  Past Medical History:  Diagnosis Date  . Allergic rhinitis 04/28/2016   Last Assessment & Plan:  Continue astelin  . Anemia 07/01/2019  . 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 In light of concurrent left nephrolithiasis, will check CT renal colic next year instead of renal US.   [name] Anticoagulated on Coumadin 01/04/2018  . Anxiety 05/10/2016   Last Assessment & Plan:  Doing well off of zoloft.  . Asthma   . Asymptomatic microscopic hematuria 06/20/2017   Last Assessment & Plan:  Had hematuria workup in [facility] in 2016  which negative CT and cystoscopy. UA with 2+ blood last visit - we discussed recommendation for repeat workup at 5 years or if degree of hematuria progresses.   . Atrial fibrillation ([place]) 03/29/2017  . Atrial flutter ([place]) 03/29/2017  . Backache 12/14/2015   Last Assessment & Plan:  Pain management referral for further evaluation.  . Bilateral pleural effusion 08/09/2017  . Chronic allergic rhinitis 04/28/2016   Last Assessment & Plan:  Continue astelin  . Chronic anticoagulation 03/29/2017  . Chronic atrial fibrillation ([place]) 07/23/2015   Last Assessment & Plan:  Coumadin and metoprolol, cardiology referral to establish care.  . Chronic midline back pain 12/14/2015   Last Assessment & Plan:  Pain management referral for further evaluation.  . Chronic obstructive lung disease ([place]) 06/12/2016   With hypoxia  . Chronic prostatitis 07/23/2015   Last Assessment & Plan:  Has largely resolved since stopping bike riding. Recommend annual DRE AND PSA - will see back 12/2015 for annual screening, given 1st degree fhx. To call office for recurrent prostatitis symptoms.   . Chronic respiratory failure with hypoxia ([place]) 07/29/2019  . 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 COPD (chronic obstructive pulmonary disease) ([place]) 06/12/2016  . Coronary arteriosclerosis in native artery 03/29/2017  . Coronary artery disease involving native coronary artery of native heart with angina pectoris ([place]) 03/29/2017  . Cough 10/17/2016   Last Assessment & Plan:  Discussed typical course for acute viral illness. If symptoms worsen or fail to improve by 7-10d, delayed ATBs, fluids, rest, NSAIDs/APAP prn. Seek care if not improving. Needs earlier INR check due to ATBs.  [name] Dyspnea 02/01/2016   Last Assessment & Plan:  Overall improving, eval by pulm, plan for CT, neg stress test with cardiology. Recent switch to carvedilol due to side effects.  . Encounter for therapeutic drug monitoring 01/06/2019  . Enterococcal  bacteremia   . Epidermoid cyst of skin 08/24/2017  . Essential hypertension 12/14/2015   Last Assessment & Plan:  Hypertension control: controlled  Medications: compliant Medication Management: as noted in orders Home blood pressure monitoring recommended additionally as needed for symptoms  The patient's care plan was reviewed and updated. Instructions and counseling were provided regarding patient goals and barriers. He was counseled to adopt a healthy lifestyle. Educational resources and self-management tools have been provided as charted in [facility] list.   . H/O maze procedure 03/29/2017  . [name] 03/29/2017   Overview:  2011  . History of coronary artery bypass graft 03/29/2017  . Hx of CABG 03/29/2017  . Hyperlipidemia 03/29/2017  . Hypersensitivity angiitis ([place]) 10/01/2017  . Hypertensive heart disease 03/29/2017  . Hypertensive heart disease with heart failure ([place]) 03/29/2017  . Hypertensive heart failure ([place]) 03/29/2017  . Hypokalemia due to excessive renal loss of potassium 02/18/2018  . Hypotension, chronic 06/06/2018  . International normalized ratio (INR) raised 07/26/2017  . Kidney stone 07/23/2015  . Kidney stones 07/23/2015   Overview:  x 3  Last Assessment & Plan:  By [place] has left nephrolithiasis, but not visible by KUB. Will check CT renal colic next year to assess both stone burden as well as to surveil AML.   [name] Left ureteral stone 01/23/2018  . Leukocytoclastic vasculitis ([place]) 10/01/2017  . Localized edema 01/04/2018  . Long term (current) use of anticoagulants 03/29/2017  . Lumbar radicular pain 01/19/2016  . Lumbar radiculopathy 01/19/2016  . Maculopapular rash 09/03/2017  . Microscopic hematuria 06/20/2017   Last Assessment & Plan:  Had hematuria workup in [facility] in 2016 which negative CT and cystoscopy. UA with 2+ blood last visit - we discussed recommendation for repeat workup at 5 years or if degree of hematuria progresses.   . Multiple nodules of lung 06/12/2016  .  Nasal discharge 02/25/2016   Last Assessment & Plan:  Trial zyrtec and flonase  . Nephrolithiasis 07/23/2015   Overview:  x 3  Last Assessment & Plan:  By [place] has left nephrolithiasis, but not visible by KUB. Will check CT renal colic next year to assess both stone burden as well as to surveil AML.   Overview:  x 3  Last Assessment & Plan:  Has 17m nonobstructing LUP stone - not visible by KUB.  Will check renal [place]/2019 - he will contact office sooner if symptomatic.   . Non-sustained ventricular tachycardia ([place] 03/29/2017  . Nonsustained ventricular tachycardia ([place] 03/29/2017  . Other hyperlipidemia 03/29/2017  . Palpitations 10/01/2017  . Paroxysmal atrial fibrillation ([place] 03/29/2017  . Pleural effusion, bilateral 08/09/2017  . Pneumonia due to COVID-19 virus 02/07/2021  . Post-nasal drainage 02/25/2016   Last Assessment & Plan:  Trial zyrtec and flonase  . Prostate cancer screening 06/20/2017   Last Assessment & Plan:  Recommend continued annual CaP screening until within 10 years of life expectancy. Given good health and fhx of longevity, would anticipate CaP screening to continue until age 75  PSA today and again in one year on day of visit.  . Pulmonary arterial hypertension ([place]) 02/20/2018  . Pulmonary edema   . Pulmonary hypertension ([place]) 08/09/2017  . Pulmonary nodules 06/12/2016  . S/P AVR 03/20/2016  . S/P AVR (aortic valve replacement) 03/20/2016  . Strain of deltoid muscle, initial encounter   . Supratherapeutic INR 07/26/2017  . Syncope 03/29/2017  . Syncope and collapse 02/01/2016  . Typical atrial flutter ([place]) 02/01/2016    Past Surgical History:  Procedure Laterality Date  . CHOLECYSTECTOMY    . CORONARY ARTERY BYPASS GRAFT    . EXPLORATORY LAPAROTOMY  07/30/2017  . FOOT SURGERY    . FRACTURE SURGERY Right    wrist and forearm  . HERNIA REPAIR    . MECHANICAL AORTIC VALVE REPLACEMENT    . NASAL SINUS SURGERY    . RIGHT HEART CATH N/A 01/18/2018   Procedure: RIGHT HEART CATH;   Surgeon: Jolaine Artist, MD;  Location: [facility];  Service: Cardiovascular;  Laterality: N/A;  . RIGHT HEART CATH N/A 05/14/2018   Procedure: RIGHT HEART CATH;  Surgeon: Jolaine Artist, MD;  Location: [place] [facility];  Service: Cardiovascular;  Laterality: N/A;  . TEE WITHOUT CARDIOVERSION N/A 05/21/2018   Procedure: TRANSESOPHAGEAL ECHOCARDIOGRAM (TEE);  Surgeon: Jolaine Artist, MD;  Location: [facility] ENDOSCOPY;  Service: Cardiovascular;  Laterality: N/A;  . UPPER GASTROINTESTINAL ENDOSCOPY  07/12/2017   Patchy areas of mucosal inflammation noted in the antrum with edema,erthema and ulcerations. Bx. Chronicfocally active gastritis.  [name] VASECTOMY      Current Medications: Current Meds  Medication Sig  . acetaminophen (TYLENOL) 650 MG CR tablet Take 1 tablet (650 mg total) by mouth every 8 (eight) hours as needed for pain. Not to use with norco since combined with tylenol.  [name] albuterol (VENTOLIN HFA) 108 (90 Base) MCG/ACT inhaler Inhale 2 puffs into the lungs every 6 (six) hours as needed for wheezing or shortness of breath.  [name] aspirin EC 81 MG tablet Take 81 mg by mouth daily.  . B Complex-C (B-COMPLEX WITH VITAMIN C) tablet Take 1 tablet by mouth daily. Unknown strength  . Cholecalciferol (VITAMIN D) 125 MCG (5000 UT) CAPS Take 1 capsule by mouth daily.  . Coenzyme Q10 (COQ10 PO) Take 1 capsule by mouth daily.  . fluticasone (FLONASE) 50 MCG/ACT nasal spray Place 2 sprays into both nostrils daily.  [name] KRILL OIL PO Take 1 capsule by mouth daily.  [name] losartan (COZAAR) 50 MG tablet Take 50 mg by mouth daily.  . Magnesium Gluconate (MAGNESIUM 27 PO) Take 1 tablet by mouth every other day.   . metolazone (ZAROXOLYN) 2.5 MG tablet Take 2.5 mg by mouth as needed (fluid retention).  . midodrine (PROAMATINE) 10 MG tablet Take 1 tablet (10 mg total) by mouth 3 (three) times daily with meals.  . OPSUMIT 10 MG tablet TAKE 1 TABLET DAILY (Patient taking differently: Take 10 mg by mouth  daily.)  . potassium chloride SA (KLOR-CON) 20 MEQ tablet Take 2 tablets (40 mEq total) by mouth 2 (two) times daily.  . rosuvastatin (CRESTOR) 5 MG tablet TAKE 1 TABLET(5 MG) BY MOUTH DAILY (Patient taking differently: Take 5 mg by mouth daily. TAKE 1 TABLET(5 MG) BY MOUTH DAILY)  . Selexipag (UPTRAVI) 400 MCG TABS Take 400 mcg by mouth in the morning. (Patient taking differently: Take 600 mcg by mouth in the morning. 400 mcg in am and 285mg pm)  . spironolactone (ALDACTONE) 25 MG tablet  TAKE 1/2 TABLET BY MOUTH EVERY DAY  . torsemide (DEMADEX) 20 MG tablet Take 60 mg daily alternating with 40 mg daily (Patient taking differently: Take 20 mg by mouth daily. Take 60 mg daily alternating with 40 mg daily)  . Vitamin D, Ergocalciferol, (DRISDOL) 1.25 MG (50000 UNIT) CAPS capsule TAKE 1 CAPSULE BY MOUTH EVERY 7 DAYS (Patient taking differently: Take 50,000 Units by mouth every 7 (seven) days.)  . warfarin (COUMADIN) 5 MG tablet TAKE 1/2 TO 1 TABLET DAILY AS DIRECTED BY COUMADIN CLINIC (Patient taking differently: Take 2.5 mg by mouth as directed. TAKE 1/2 TO 1 TABLET DAILY AS DIRECTED BY COUMADIN CLINIC)  . [DISCONTINUED] pravastatin (PRAVACHOL) 20 MG tablet Take 1 tablet by mouth daily.  . [DISCONTINUED] sertraline (ZOLOFT) 25 MG tablet Take 25 mg by mouth daily.     Allergies:   Amoxicillin-pot clavulanate, Pantoprazole sodium, Tape, and Wound dressing adhesive   Social History   Socioeconomic History  . Marital status: Single    Spouse name: Not on file  . Number of children: Not on file  . Years of education: Not on file  . Highest education level: Not on file  Occupational History  . Not on file  Tobacco Use  . Smoking status: Former Smoker    Packs/day: 2.00    Years: 34.00    Pack years: 68.00    Types: Cigarettes    Quit date: 07/16/2000    Years since quitting: 20.6  . Smokeless tobacco: Never Used  Vaping Use  . Vaping Use: Never used  Substance and Sexual Activity  .  Alcohol use: Not Currently  . Drug use: No  . Sexual activity: Not on file  Other Topics Concern  . Not on file  Social History Narrative  . Not on file   Social Determinants of Health   Financial Resource Strain: Low Risk   . Difficulty of Paying Living Expenses: Not hard at all  Food Insecurity: No Food Insecurity  . Worried About Charity fundraiser in the Last Year: Never true  . Ran Out of Food in the Last Year: Never true  Transportation Needs: No Transportation Needs  . Lack of Transportation (Medical): No  . Lack of Transportation (Non-Medical): No  Physical Activity: Not on file  Stress: Not on file  Social Connections: Not on file     Family History: The patient's family history includes Arthritis in his mother; Asthma in his mother; Heart attack in his father; Hypertension in his father; Stroke in his paternal grandmother. There is no history of Colon cancer. ROS:   Please see the history of present illness.    All 14 point review of systems negative except as described per history of present illness  EKGs/Labs/Other Studies Reviewed:      Recent Labs: 05/17/2020: Magnesium 2.6 06/21/2020: B Natriuretic Peptide 154.4 01/27/2021: ALT 16; BUN 20; Creatinine, Ser 1.58; Hemoglobin 13.8; Platelets 197; Potassium 3.5; Sodium 134  Recent Lipid Panel    Component Value Date/Time   CHOL 133 10/12/2020 1104   TRIG 111 10/12/2020 1104   HDL 44 10/12/2020 1104   CHOLHDL 3.0 10/12/2020 1104   VLDL 11 10/21/2018 1028   LDLCALC 70 10/12/2020 1104    Physical Exam:    VS:  BP 110/70 (BP Location: Right Arm, Patient Position: Sitting)   Pulse 66   Ht 5' 9.5" (1.765 m)   Wt 148 lb (67.1 kg)   SpO2 95%   BMI 21.54 kg/m  Wt Readings from Last 3 Encounters:  03/21/21 148 lb (67.1 kg)  01/31/21 146 lb (66.2 kg)  01/24/21 146 lb (66.2 kg)     GEN:  Well nourished, well developed in no acute distress HEENT: Normal NECK: No JVD; No carotid bruits LYMPHATICS: No  lymphadenopathy CARDIAC: RRR, crisp mechanical valve sounds, no rubs, no gallops RESPIRATORY:  Clear to auscultation without rales, wheezing or rhonchi  ABDOMEN: Soft, non-tender, non-distended MUSCULOSKELETAL:  No edema; No deformity  SKIN: Warm and dry LOWER EXTREMITIES: no swelling NEUROLOGIC:  Alert and oriented x 3 PSYCHIATRIC:  Normal affect   ASSESSMENT:    1. S/P AVR   2. Coronary artery disease involving native coronary artery of native heart with angina pectoris ([place])   3. Typical atrial flutter ([place])   4. History of coronary artery bypass graft    PLAN:    In order of problems listed above:  1. Status post aortic valve replacement crisp mechanical valve sounds.  We will schedule him to have another echocardiogram. 2. Coronary disease without any symptoms there is no chest pain tightness squeezing pressure burning chest. 3. Typical atrial flutter.  He is anticoagulated he does not want to do anything about it is a chronic problem and he is tolerating this very well. 4. Dyslipidemia I did review his cholesterol which showed LDL of 70 HDL 44 this is from October 2021   Medication Adjustments/Labs and Tests Ordered: Current medicines are reviewed at length with the patient today.  Concerns regarding medicines are outlined above.  No orders of the defined types were placed in this encounter.  Medication changes: No orders of the defined types were placed in this encounter.   Signed, Park Liter, MD, [facility] 03/21/2021 2:41 PM    [place]

## 2021-03-21 NOTE — Patient Instructions (Signed)
Medication Instructions:  Your physician recommends that you continue on your current medications as directed. Please refer to the Current Medication list given to you today.  *If you need a refill on your cardiac medications before your next appointment, please call your pharmacy*   Lab Work: None If you have labs (blood work) drawn today and your tests are completely normal, you will receive your results only by: [name] MyChart Message (if you have MyChart) OR . A paper copy in the mail If you have any lab test that is abnormal or we need to change your treatment, we will call you to review the results.   Testing/Procedures: Your physician has requested that you have an echocardiogram. Echocardiography is a painless test that uses sound waves to create images of your heart. It provides your doctor with information about the size and shape of your heart and how well your heart's chambers and valves are working. This procedure takes approximately one hour. There are no restrictions for this procedure.     Follow-Up: At [facility], you and your health needs are our priority.  As part of our continuing mission to provide you with exceptional heart care, we have created designated Provider Care Teams.  These Care Teams include your primary Cardiologist (physician) and Advanced Practice Providers (APPs -  Physician Assistants and Nurse Practitioners) who all work together to provide you with the care you need, when you need it.  We recommend signing up for the patient portal called "MyChart".  Sign up information is provided on this After Visit Summary.  MyChart is used to connect with patients for Virtual Visits (Telemedicine).  Patients are able to view lab/test results, encounter notes, upcoming appointments, etc.  Non-urgent messages can be sent to your provider as well.   To learn more about what you can do with MyChart, go to [email or web address].    Your next appointment:   6  month(s)  The format for your next appointment:   In Person  Provider:   Jenne Campus, MD   Other Instructions   Echocardiogram An echocardiogram is a test that uses sound waves (ultrasound) to produce images of the heart. Images from an echocardiogram can provide important information about:  Heart size and shape.  The size and thickness and movement of your heart's walls.  Heart muscle function and strength.  Heart valve function or if you have stenosis. Stenosis is when the heart valves are too narrow.  If blood is flowing backward through the heart valves (regurgitation).  A tumor or infectious growth around the heart valves.  Areas of heart muscle that are not working well because of poor blood flow or injury from a heart attack.  Aneurysm detection. An aneurysm is a weak or damaged part of an artery wall. The wall bulges out from the normal force of blood pumping through the body. Tell a health care provider about:  Any allergies you have.  All medicines you are taking, including vitamins, herbs, eye drops, creams, and over-the-counter medicines.  Any blood disorders you have.  Any surgeries you have had.  Any medical conditions you have.  Whether you are pregnant or may be pregnant. What are the risks? Generally, this is a safe test. However, problems may occur, including an allergic reaction to dye (contrast) that may be used during the test. What happens before the test? No specific preparation is needed. You may eat and drink normally. What happens during the test?  You will take off your  clothes from the waist up and put on a hospital gown.  Electrodes or electrocardiogram (ECG)patches may be placed on your chest. The electrodes or patches are then connected to a device that monitors your heart rate and rhythm.  You will lie down on a table for an ultrasound exam. A gel will be applied to your chest to help sound waves pass through your skin.  A  handheld device, called a transducer, will be pressed against your chest and moved over your heart. The transducer produces sound waves that travel to your heart and bounce back (or "echo" back) to the transducer. These sound waves will be captured in real-time and changed into images of your heart that can be viewed on a video monitor. The images will be recorded on a computer and reviewed by your health care provider.  You may be asked to change positions or hold your breath for a short time. This makes it easier to get different views or better views of your heart.  In some cases, you may receive contrast through an IV in one of your veins. This can improve the quality of the pictures from your heart. The procedure may vary among health care providers and hospitals.   What can I expect after the test? You may return to your normal, everyday life, including diet, activities, and medicines, unless your health care provider tells you not to do that. Follow these instructions at home:  It is up to you to get the results of your test. Ask your health care provider, or the department that is doing the test, when your results will be ready.  Keep all follow-up visits. This is important. Summary  An echocardiogram is a test that uses sound waves (ultrasound) to produce images of the heart.  Images from an echocardiogram can provide important information about the size and shape of your heart, heart muscle function, heart valve function, and other possible heart problems.  You do not need to do anything to prepare before this test. You may eat and drink normally.  After the echocardiogram is completed, you may return to your normal, everyday life, unless your health care provider tells you not to do that. This information is not intended to replace advice given to you by your health care provider. Make sure you discuss any questions you have with your health care provider. Document Revised:  08/03/2020 Document Reviewed: 08/03/2020 Elsevier Patient Education  2021 [employer].

## 2021-03-21 NOTE — Addendum Note (Signed)
Addended by: Senaida Ores on: 03/21/2021 02:45 PM   Modules accepted: Orders

## 2021-03-22 ENCOUNTER — Ambulatory Visit (INDEPENDENT_AMBULATORY_CARE_PROVIDER_SITE_OTHER): Payer: Medicare Other

## 2021-03-22 DIAGNOSIS — I4892 Unspecified atrial flutter: Secondary | ICD-10-CM | POA: Diagnosis not present

## 2021-03-22 DIAGNOSIS — Z7901 Long term (current) use of anticoagulants: Secondary | ICD-10-CM

## 2021-03-22 LAB — POCT INR: INR: 2.2 (ref 2.0–3.0)

## 2021-03-22 NOTE — Patient Instructions (Signed)
increase to 1/2 tablet daily except 1 tablet on Sunday, Wednesday and Friday..  Please call our office with any medication changes or concerns [phone]. Return in 4 weeks for INR check.

## 2021-03-29 ENCOUNTER — Encounter: Payer: Self-pay | Admitting: Family Medicine

## 2021-03-29 ENCOUNTER — Ambulatory Visit (INDEPENDENT_AMBULATORY_CARE_PROVIDER_SITE_OTHER): Payer: Medicare Other | Admitting: Family Medicine

## 2021-03-29 ENCOUNTER — Other Ambulatory Visit: Payer: Self-pay

## 2021-03-29 VITALS — BP 140/86 | HR 70 | Temp 98.1°F | Ht 69.5 in | Wt 145.2 lb

## 2021-03-29 DIAGNOSIS — R300 Dysuria: Secondary | ICD-10-CM | POA: Diagnosis not present

## 2021-03-29 DIAGNOSIS — R1900 Intra-abdominal and pelvic swelling, mass and lump, unspecified site: Secondary | ICD-10-CM

## 2021-03-29 LAB — POC URINALSYSI DIPSTICK (AUTOMATED)
Bilirubin, UA: NEGATIVE
Blood, UA: NEGATIVE
Glucose, UA: NEGATIVE
Ketones, UA: NEGATIVE
Leukocytes, UA: NEGATIVE
Nitrite, UA: NEGATIVE
Protein, UA: NEGATIVE
Spec Grav, UA: 1.015 (ref 1.010–1.025)
Urobilinogen, UA: 0.2 E.U./dL
pH, UA: 6.5 (ref 5.0–8.0)

## 2021-03-29 NOTE — Patient Instructions (Signed)
Stay hydrated.   Warning signs/symptoms: Uncontrollable nausea/vomiting, fevers, worsening symptoms despite treatment, confusion.  Give [place] around 2 business days to get culture back to you.  Someone will reach out regarding the ultrasound. If you don't hear anything in the next week, please reach out.   Let us know if you need anything.

## 2021-03-29 NOTE — Progress Notes (Signed)
poct

## 2021-03-29 NOTE — Progress Notes (Signed)
Chief Complaint  Patient presents with  . Bloated  . Dysuria    Dysuria in the evening    Subjective: Patient is a 75 y.o. male here for pain with urination.   Over the past several days, the patient has noticed pain with urination in the evening.  Bowel movements are normal.  He takes a diuretic that makes him urinate frequently and he wonders if this is the cause.  He is not having any fevers, nausea, flank pain, vomiting, bleeding, discharge, testicular pain, or external lesions in the genitalia.  He has no pain or fullness in the perennial region.  Over the past couple weeks he has noticed a bulge in his right lower abdominal area.  He noticed that it protrudes more when he bears down.  He has a history of an inguinal hernia on the right that was repaired.  No blood in his stool or skin changes.  Past Medical History:  Diagnosis Date  . Allergic rhinitis 04/28/2016   Last Assessment & Plan:  Continue astelin  . Anemia 07/01/2019  . 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 In light of concurrent left nephrolithiasis, will check CT renal colic next year instead of renal US.   [name] Anticoagulated on Coumadin 01/04/2018  . Anxiety 05/10/2016   Last Assessment & Plan:  Doing well off of zoloft.  . Asthma   . Asymptomatic microscopic hematuria 06/20/2017   Last Assessment & Plan:  Had hematuria workup in [facility] in 2016 which negative CT and cystoscopy. UA with 2+ blood last visit - we discussed recommendation for repeat workup at 5 years or if degree of hematuria progresses.   . Atrial fibrillation ([place]) 03/29/2017  . Atrial flutter ([place]) 03/29/2017  . Backache 12/14/2015   Last Assessment & Plan:  Pain management referral for further evaluation.  . Bilateral pleural effusion 08/09/2017  . Chronic allergic rhinitis 04/28/2016   Last Assessment & Plan:  Continue astelin  . Chronic anticoagulation 03/29/2017  . Chronic atrial fibrillation ([place])  07/23/2015   Last Assessment & Plan:  Coumadin and metoprolol, cardiology referral to establish care.  . Chronic midline back pain 12/14/2015   Last Assessment & Plan:  Pain management referral for further evaluation.  . Chronic obstructive lung disease ([place]) 06/12/2016   With hypoxia  . Chronic prostatitis 07/23/2015   Last Assessment & Plan:  Has largely resolved since stopping bike riding. Recommend annual DRE AND PSA - will see back 12/2015 for annual screening, given 1st degree fhx. To call office for recurrent prostatitis symptoms.   . Chronic respiratory failure with hypoxia ([place]) 07/29/2019  . 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 COPD (chronic obstructive pulmonary disease) ([place]) 06/12/2016  . Coronary arteriosclerosis in native artery 03/29/2017  . Coronary artery disease involving native coronary artery of native heart with angina pectoris ([place]) 03/29/2017  . Cough 10/17/2016   Last Assessment & Plan:  Discussed typical course for acute viral illness. If symptoms worsen or fail to improve by 7-10d, delayed ATBs, fluids, rest, NSAIDs/APAP prn. Seek care if not improving. Needs earlier INR check due to ATBs.  [name] Dyspnea 02/01/2016   Last Assessment & Plan:  Overall improving, eval by pulm, plan for CT, neg stress test with cardiology. Recent switch to carvedilol due to side effects.  . Encounter for therapeutic drug monitoring 01/06/2019  . Enterococcal bacteremia   . Epidermoid cyst of skin 08/24/2017  .  Essential hypertension 12/14/2015   Last Assessment & Plan:  Hypertension control: controlled  Medications: compliant Medication Management: as noted in orders Home blood pressure monitoring recommended additionally as needed for symptoms  The patient's care plan was reviewed and updated. Instructions and counseling were provided regarding patient goals and barriers. He was counseled to adopt a healthy lifestyle. Educational resources and self-management tools have been provided as  charted in [facility] list.   . H/O maze procedure 03/29/2017  . [name] 03/29/2017   Overview:  2011  . History of coronary artery bypass graft 03/29/2017  . Hx of CABG 03/29/2017  . Hyperlipidemia 03/29/2017  . Hypersensitivity angiitis ([place]) 10/01/2017  . Hypertensive heart disease 03/29/2017  . Hypertensive heart disease with heart failure ([place]) 03/29/2017  . Hypertensive heart failure ([place]) 03/29/2017  . Hypokalemia due to excessive renal loss of potassium 02/18/2018  . Hypotension, chronic 06/06/2018  . International normalized ratio (INR) raised 07/26/2017  . Kidney stone 07/23/2015  . Kidney stones 07/23/2015   Overview:  x 3  Last Assessment & Plan:  By [place] has left nephrolithiasis, but not visible by KUB. Will check CT renal colic next year to assess both stone burden as well as to surveil AML.   [name] Left ureteral stone 01/23/2018  . Leukocytoclastic vasculitis ([place]) 10/01/2017  . Localized edema 01/04/2018  . Long term (current) use of anticoagulants 03/29/2017  . Lumbar radicular pain 01/19/2016  . Lumbar radiculopathy 01/19/2016  . Maculopapular rash 09/03/2017  . Microscopic hematuria 06/20/2017   Last Assessment & Plan:  Had hematuria workup in [facility] in 2016 which negative CT and cystoscopy. UA with 2+ blood last visit - we discussed recommendation for repeat workup at 5 years or if degree of hematuria progresses.   . Multiple nodules of lung 06/12/2016  . Nasal discharge 02/25/2016   Last Assessment & Plan:  Trial zyrtec and flonase  . Nephrolithiasis 07/23/2015   Overview:  x 3  Last Assessment & Plan:  By [place] has left nephrolithiasis, but not visible by KUB. Will check CT renal colic next year to assess both stone burden as well as to surveil AML.   Overview:  x 3  Last Assessment & Plan:  Has 79m nonobstructing LUP stone - not visible by KUB.  Will check renal [place]2019 - he will contact office sooner if symptomatic.   . Non-sustained ventricular tachycardia ([place] 03/29/2017  .  Nonsustained ventricular tachycardia ([place] 03/29/2017  . Other hyperlipidemia 03/29/2017  . Palpitations 10/01/2017  . Pleural effusion, bilateral 08/09/2017  . Pneumonia due to COVID-19 virus 02/07/2021  . Post-nasal drainage 02/25/2016   Last Assessment & Plan:  Trial zyrtec and flonase  . Prostate cancer screening 06/20/2017   Last Assessment & Plan:  Recommend continued annual CaP screening until within 10 years of life expectancy. Given good health and fhx of longevity, would anticipate CaP screening to continue until age 89  PSA today and again in one year on day of visit.  . Pulmonary arterial hypertension ([place] 02/20/2018  . Pulmonary edema   . Pulmonary hypertension ([place] 08/09/2017  . Pulmonary nodules 06/12/2016  . S/P AVR 03/20/2016  . S/P AVR (aortic valve replacement) 03/20/2016  . Strain of deltoid muscle, initial encounter   . Supratherapeutic INR 07/26/2017  . Syncope and collapse 02/01/2016  . Typical atrial flutter ([place] 02/01/2016    Objective: BP 140/86 (BP Location: Left Arm, Patient Position: Sitting, Cuff Size: Normal)   Pulse 70  Temp 98.1 F (36.7 C) (Oral)   Ht 5' 9.5" (1.765 m)   Wt 145 lb 4 oz (65.9 kg)   SpO2 97%   BMI 21.14 kg/m  General: Awake, appears stated age HEENT: MMM, EOMi Heart: RRR Lungs: CTAB, no rales, wheezes or rhonchi. No accessory muscle use Abdomen: In the left lower quadrant near the suprapubic region, there is a fleshy bulge that seems to be reproducible.  There is no tenderness, fluctuance, erythema, ecchymosis, excessive warmth, overlying skin pigment changes. GU: There is no hernia in the inguinal canal on the left or right Psych: Age appropriate judgment and insight, normal affect and mood  Assessment and Plan: Dysuria - Plan: POCT Urinalysis Dipstick (Automated), Urine Culture  Abdominal mass, unspecified abdominal location - Plan: US Abdomen Limited, Urine Culture  1.  Urinalysis is unremarkable.  We will culture for thoroughness.  I  doubt this is related to #2. 2.  I suspect he may have a ventral hernia.  Will check an ultrasound for more information.  If it is not bothering him, we do not necessarily need to do anything but if it is, will further general surgery. The patient voiced understanding and agreement to the plan.  [place], DO 03/29/21  4:39 PM

## 2021-03-30 LAB — URINE CULTURE
MICRO NUMBER:: 11732939
Result:: NO GROWTH
SPECIMEN QUALITY:: ADEQUATE

## 2021-03-31 ENCOUNTER — Ambulatory Visit (HOSPITAL_COMMUNITY)
Admission: RE | Admit: 2021-03-31 | Discharge: 2021-03-31 | Disposition: A | Discharge status: Home / Self Care | Payer: Medicare Other | Source: Ambulatory Visit | Attending: Internal Medicine | Admitting: Internal Medicine

## 2021-03-31 ENCOUNTER — Encounter (HOSPITAL_COMMUNITY): Payer: Self-pay | Admitting: Internal Medicine

## 2021-03-31 ENCOUNTER — Other Ambulatory Visit: Payer: Self-pay

## 2021-03-31 ENCOUNTER — Telehealth: Payer: Self-pay | Admitting: *Deleted

## 2021-03-31 VITALS — BP 140/80 | HR 57 | Wt 149.8 lb

## 2021-03-31 DIAGNOSIS — Z7901 Long term (current) use of anticoagulants: Secondary | ICD-10-CM | POA: Diagnosis not present

## 2021-03-31 DIAGNOSIS — I482 Chronic atrial fibrillation, unspecified: Secondary | ICD-10-CM | POA: Insufficient documentation

## 2021-03-31 DIAGNOSIS — I110 Hypertensive heart disease with heart failure: Secondary | ICD-10-CM | POA: Insufficient documentation

## 2021-03-31 DIAGNOSIS — J9611 Chronic respiratory failure with hypoxia: Secondary | ICD-10-CM | POA: Diagnosis not present

## 2021-03-31 DIAGNOSIS — Z8249 Family history of ischemic heart disease and other diseases of the circulatory system: Secondary | ICD-10-CM | POA: Diagnosis not present

## 2021-03-31 DIAGNOSIS — M791 Myalgia, unspecified site: Secondary | ICD-10-CM | POA: Insufficient documentation

## 2021-03-31 DIAGNOSIS — K7460 Unspecified cirrhosis of liver: Secondary | ICD-10-CM | POA: Diagnosis not present

## 2021-03-31 DIAGNOSIS — Z888 Allergy status to other drugs, medicaments and biological substances status: Secondary | ICD-10-CM | POA: Insufficient documentation

## 2021-03-31 DIAGNOSIS — M310 Hypersensitivity angiitis: Secondary | ICD-10-CM | POA: Diagnosis not present

## 2021-03-31 DIAGNOSIS — R188 Other ascites: Secondary | ICD-10-CM | POA: Insufficient documentation

## 2021-03-31 DIAGNOSIS — Z951 Presence of aortocoronary bypass graft: Secondary | ICD-10-CM | POA: Insufficient documentation

## 2021-03-31 DIAGNOSIS — J8489 Other specified interstitial pulmonary diseases: Secondary | ICD-10-CM | POA: Insufficient documentation

## 2021-03-31 DIAGNOSIS — Z79899 Other long term (current) drug therapy: Secondary | ICD-10-CM | POA: Diagnosis not present

## 2021-03-31 DIAGNOSIS — Z8616 Personal history of COVID-19: Secondary | ICD-10-CM | POA: Diagnosis not present

## 2021-03-31 DIAGNOSIS — Z87891 Personal history of nicotine dependence: Secondary | ICD-10-CM | POA: Diagnosis not present

## 2021-03-31 DIAGNOSIS — I2721 Secondary pulmonary arterial hypertension: Secondary | ICD-10-CM | POA: Diagnosis not present

## 2021-03-31 DIAGNOSIS — I483 Typical atrial flutter: Secondary | ICD-10-CM | POA: Diagnosis not present

## 2021-03-31 DIAGNOSIS — D509 Iron deficiency anemia, unspecified: Secondary | ICD-10-CM | POA: Diagnosis not present

## 2021-03-31 DIAGNOSIS — Z9049 Acquired absence of other specified parts of digestive tract: Secondary | ICD-10-CM | POA: Insufficient documentation

## 2021-03-31 DIAGNOSIS — Z952 Presence of prosthetic heart valve: Secondary | ICD-10-CM | POA: Insufficient documentation

## 2021-03-31 DIAGNOSIS — I25119 Atherosclerotic heart disease of native coronary artery with unspecified angina pectoris: Secondary | ICD-10-CM | POA: Diagnosis not present

## 2021-03-31 DIAGNOSIS — Z880 Allergy status to penicillin: Secondary | ICD-10-CM | POA: Insufficient documentation

## 2021-03-31 DIAGNOSIS — I2510 Atherosclerotic heart disease of native coronary artery without angina pectoris: Secondary | ICD-10-CM | POA: Diagnosis not present

## 2021-03-31 DIAGNOSIS — Z7982 Long term (current) use of aspirin: Secondary | ICD-10-CM | POA: Insufficient documentation

## 2021-03-31 HISTORY — DX: Heart failure, unspecified: I50.9

## 2021-03-31 MED ORDER — METOLAZONE 2.5 MG PO TABS: 2.5000 mg | ORAL_TABLET | Freq: Every day | ORAL | 3 refills | Status: DC | PRN

## 2021-03-31 MED ORDER — UPTRAVI 400 MCG PO TABS: 400.0000 ug | ORAL_TABLET | Freq: Two times a day (BID) | ORAL | 6 refills | Status: DC

## 2021-03-31 NOTE — Patient Instructions (Signed)
Increase Uptravi to 400 mcg Twice daily  Please call our office in September to schedule your follow up appointment  If you have any questions or concerns before your next appointment please send [place] a message through [place] or call our office at [phone].    TO LEAVE A MESSAGE FOR THE NURSE SELECT OPTION 2, PLEASE LEAVE A MESSAGE INCLUDING: . YOUR NAME . DATE OF BIRTH . CALL BACK NUMBER . REASON FOR CALL**this is important as we prioritize the call backs  [place] AS LONG AS YOU CALL BEFORE 4:00 PM  At the [facility], you and your health needs are our priority. As part of our continuing mission to provide you with exceptional heart care, we have created designated Provider Care Teams. These Care Teams include your primary Cardiologist (physician) and Advanced Practice Providers (APPs- Physician Assistants and Nurse Practitioners) who all work together to provide you with the care you need, when you need it.   You may see any of the following providers on your designated Care Team at your next follow up: [name] Dr Glori Bickers . Dr Loralie Champagne . Dr Vickki Muff . Darrick Grinder, NP . Lyda Jester, [place] . Audry Riles, PharmD   Please be sure to bring in all your medications bottles to every appointment.

## 2021-03-31 NOTE — Addendum Note (Signed)
Encounter addended by: Scarlette Calico, RN on: 03/31/2021 3:44 PM  Actions taken: Alternative orders not taken and original order placed, Pharmacy for encounter modified, Order list changed, Clinical Note Signed

## 2021-03-31 NOTE — Telephone Encounter (Signed)
Called pt and lvm to return call.  

## 2021-03-31 NOTE — Telephone Encounter (Signed)
Who Is Calling Patient / Member / Family / Caregiver Caller Name [place] Phone Number [phone] Patient Name Lance Hicks Patient DOB 1946/09/04 Call Type Message Only Information Provided Reason for Call Request for Lab/Test Results Initial Comment Caller states he received a text message stating that he has received a message on his MyChart but caller is not able to access his MyChart. Caller is not able to check his test result. Additional Comment Office hours provided. Declined triage. Disp. Time Disposition Final User 03/31/2021 10:20:30 AM General Information Provided Yes [name]

## 2021-03-31 NOTE — Telephone Encounter (Signed)
Message from Dr. Nani Ravens given to patient over the phone (he "does not do MyChart")

## 2021-03-31 NOTE — Progress Notes (Signed)
ADVANCED HF CLINIC NOTE   Date:  03/31/2021   ID:  Lance Hicks, DOB 1946-11-15, MRN [id number]  PCP:  Lance Pai, PA-C  Cardiologist:  Dr. 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 Also has COPD (quit smoking in 2002 - 1 ppd x 25 years), HL, and HTN.  In 8/18 admitted for acute pancreatitis. Underwent lap cholecystectomy on 07/29/2017. On 8/6 hedeveloped hypotension and was taken back for open laparotomy due to bleeding from an unclear omental source.He was given fluids and 8 units PRBC perioperatively. He says his breathing has been bad since that time.   Seen at [facility] in 2018 had hi-res CT, PFTs and VQ (low prob). Felt to have Golds I COPD with asthma overlap and early pulmonary fibrosis with PAH. Thought felt pulmonary pressures may have been elevated due to recent illness. Referred to Rheumatology though appt not until May. Suggested possible RHC.   He was also seen by Cardiology. Echocardiogram 8/18 as below showed normal LV function with septal flattening and RVSP 65-50mHG. There was no comment on RV size or function. 14-day event monitor showed 10-beat run NSVT. Felt to have chronic AF and PAH. No further testing ordered.  Underwent RHC on 01/18/18 with severe PAH and cor pulmonale. Macitentan and Adcirca.   Admitted 5/21-5/30/19 with symptomatic hypotension and weight gain. He had a repeat RHC and echo with bubble study (results below). He was started on midodrine for BP support. Diuresed 25 lbs with IV lasix, then transitioned to torsemide 60 mg daily. Abdominal UKoreashowed cirrhosis with no evidence of portal HTN. Pulm consulted for possible ILD on High-res chest CT. Ortho consulted for right rotator cuff tear from horse  accident PTA. He was treated with antibiotics for Bacteremia. DC weight: 140 lbs.   At last visit Uptravi cut 800/600 -> 600/400 due to SEs (severe cramping)  He presents today for regular follow up. Remains on Adcirca and macitentan and Uptravi. Seen in ED on 01/27/21 due to hypotension due to COVID and diarrhea. He recovered. Now back on all his medicines. Now back to the gym. Feels he is getting back to his baseline but not there yet. No CP, edema, orthopnea or PND. Alternating torsemide 60 daily with 40 daily. Takes metolazone 1-2x/week  Echo 3/21 EF 45-50% AVR ok. Septal flattening RV dilated mild HK. Moderate TR RVSP 80 severe bilateral enlargement. Personally reviewed   Review of systems complete and found to be negative unless listed in HPI.   Studies:  RHC 5/21/19showed Mild/Moderate PAH in setting of high output with no evidence of intracardiac shunting. Hemodynamics as below.   High-Res Chest CT 05/16/18 1. Slightly irregular 0.9 cm peripheral right middle lobe solid pulmonary nodule. 3 month follow up recommended.  2. Dependent basilar predominant patchy subpleural reticulation and ground-glass attenuation with the suggestion of minimal associated traction bronchiolectasis. No frank honeycombing. Findings may indicate an interstitial lung disease such as early usual interstitial pneumonia (UIP) or fibrotic phase nonspecific interstitial pneumonia (NSIP). Suggest a follow-up high-resolution chest CT study in 6-12 months to assess temporal pattern stability, as clinically warranted. 3. Mild cardiomegaly. Minimal interlobular septal thickening and trace dependent bilateral pleural effusions.  4. Small to moderate volume perihepatic  ascites. 5. Ectatic 4.3 cm ascending thoracic aorta. Recommend annual imaging followup by CTA or MRA.  6. Left main and 3 vessel coronary atherosclerosis.  US Abdomen RUQ 05/16/18 - s/p cholecystectomy. + hepatic cirrhosis. Mild ascites.    Liver Doppler 05/16/18 - No hepatic, splenic, or portal venous thrombosis or occlusion. Mild ascites.  Echo with bubble study 05/16/18 LVEF 55-60% with "late bubbles" , Mechanical AVR stable with trivial perivalvular regurg, Mild MR, Severe LAE, Severe RV dilation and reduced function. No PFO. Mod TR, PA peak pressure 68 mm Hg   RHC 05/14/18 RA = 18 RV = 71/17 PA = 69/24 (39) PCW = 20 Fick cardiac output/index = 7.0/3.7 Thermo CO/CI = 7.2/3.8 PVR = 2.6 WU Ao sat = 97% PA sat = 73%, 73% SVC sat = 73%  RA sat = 72%  RHC 01/18/18 RA = 23 RV = 84/21 PA = 81/38 (53) PCW = 22 Fick cardiac output/index = 3.0/1.6 PVR = 10.4 WU FA sat = 98% on 2L  (checked on RA = 93%) PA sat = 60%, 61% SVC sat = 62%  14 Day Patch 10/01/2017-10/15/2017:  -1 run of Ventricular Tachycardia occurred lasting 10 beats with a max rate of 240 bpm (avg 161 bpm). -Atrial Flutter occurred continuously (100% burden), ranging from 55-132 bpm (avg of 87 bpm).   NM Ventilation and Perfusion Lung Scan 10/31/2017:  Low probability of pulmonary embolism, using the PIOPED criteria.    PFTs 07/02/2017:  FVC86% FEV185% FEV1/FVC 73% DLCO8.2, 32%   6MW -1400 feet; 97% to 85% (recovered with resting)   CT Chest07/08/2017: 1. Stable right middle lobe pulmonary nodule measuring up to 1 cm. 2. Minimal subpleural reticulation without evidence of honeycombing to  suggest pulmonary fibrosis. Stable centrilobular emphysema. 3. Mediastinal lymphadenopathy, increased from prior. 4. Calcified right hilar lymph nodes and multiple calcified granulomas  throughout the right lung consistent with prior granulomatous disease. 5. Ascending thoracic aortic aneurysm measuring up to 4.5 cm, unchanged. 6. Dilatation of the main pulmonary artery suggestive of pulmonary  hypertension. 7. Cardiomegaly and dense atherosclerotic coronary artery calcifications.    Past  Medical History:  Diagnosis Date  . Allergic rhinitis 04/28/2016   Last Assessment & Plan:  Continue astelin  . Anemia 07/01/2019  . 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 In light of concurrent left nephrolithiasis, will check CT renal colic next year instead of renal US.   [name] Anticoagulated on Coumadin 01/04/2018  . Anxiety 05/10/2016   Last Assessment & Plan:  Doing well off of zoloft.  . Asthma   . Asymptomatic microscopic hematuria 06/20/2017   Last Assessment & Plan:  Had hematuria workup in [facility] in 2016 which negative CT and cystoscopy. UA with 2+ blood last visit - we discussed recommendation for repeat workup at 5 years or if degree of hematuria progresses.   . Atrial fibrillation ([place]) 03/29/2017  . Atrial flutter ([place]) 03/29/2017  . Backache 12/14/2015   Last Assessment & Plan:  Pain management referral for further evaluation.  . Bilateral pleural effusion 08/09/2017  . CHF (congestive heart failure) ([name])   . Chronic allergic rhinitis 04/28/2016   Last Assessment & Plan:  Continue astelin  . Chronic anticoagulation 03/29/2017  . Chronic atrial fibrillation ([place]) 07/23/2015   Last Assessment & Plan:  Coumadin and metoprolol, cardiology referral to establish care.  . Chronic midline back pain 12/14/2015   Last Assessment & Plan:  Pain management  referral for further evaluation.  . Chronic obstructive lung disease ([place]) 06/12/2016   With hypoxia  . Chronic prostatitis 07/23/2015   Last Assessment & Plan:  Has largely resolved since stopping bike riding. Recommend annual DRE AND PSA - will see back 12/2015 for annual screening, given 1st degree fhx. To call office for recurrent prostatitis symptoms.   . Chronic respiratory failure with hypoxia ([place]) 07/29/2019  . Cirrhosis of liver not due to alcohol ([place]) 07/01/2019  . COPD (chronic obstructive pulmonary disease) ([place]) 06/12/2016  . Coronary arteriosclerosis in native artery 03/29/2017  .  Coronary artery disease involving native coronary artery of native heart with angina pectoris (JAARS) 03/29/2017  . Cough 10/17/2016   Last Assessment & Plan:  Discussed typical course for acute viral illness. If symptoms worsen or fail to improve by 7-10d, delayed ATBs, fluids, rest, NSAIDs/APAP prn. Seek care if not improving. Needs earlier INR check due to ATBs.  [name] Dyspnea 02/01/2016   Last Assessment & Plan:  Overall improving, eval by pulm, plan for CT, neg stress test with cardiology. Recent switch to carvedilol due to side effects.  . Encounter for therapeutic drug monitoring 01/06/2019  . Enterococcal bacteremia   . Epidermoid cyst of skin 08/24/2017  . Essential hypertension 12/14/2015   Last Assessment & Plan:  Hypertension control: controlled  Medications: compliant Medication Management: as noted in orders Home blood pressure monitoring recommended additionally as needed for symptoms  The patient's care plan was reviewed and updated. Instructions and counseling were provided regarding patient goals and barriers. He was counseled to adopt a healthy lifestyle. Educational resources and self-management tools have been provided as charted in [facility] list.   . H/O maze procedure 03/29/2017  . [name] 03/29/2017   Overview:  2011  . History of coronary artery bypass graft 03/29/2017  . Hx of CABG 03/29/2017  . Hyperlipidemia 03/29/2017  . Hypersensitivity angiitis ([place]) 10/01/2017  . Hypertensive heart disease 03/29/2017  . Hypertensive heart disease with heart failure ([place]) 03/29/2017  . Hypertensive heart failure ([place]) 03/29/2017  . Hypokalemia due to excessive renal loss of potassium 02/18/2018  . Hypotension, chronic 06/06/2018  . International normalized ratio (INR) raised 07/26/2017  . Kidney stone 07/23/2015  . Kidney stones 07/23/2015   Overview:  x 3  Last Assessment & Plan:  By [place] has left nephrolithiasis, but not visible by KUB. Will check CT renal colic next year to assess both  stone burden as well as to surveil AML.   [name] Left ureteral stone 01/23/2018  . Leukocytoclastic vasculitis ([place]) 10/01/2017  . Localized edema 01/04/2018  . Long term (current) use of anticoagulants 03/29/2017  . Lumbar radicular pain 01/19/2016  . Lumbar radiculopathy 01/19/2016  . Maculopapular rash 09/03/2017  . Microscopic hematuria 06/20/2017   Last Assessment & Plan:  Had hematuria workup in [facility] in 2016 which negative CT and cystoscopy. UA with 2+ blood last visit - we discussed recommendation for repeat workup at 5 years or if degree of hematuria progresses.   . Multiple nodules of lung 06/12/2016  . Nasal discharge 02/25/2016   Last Assessment & Plan:  Trial zyrtec and flonase  . Nephrolithiasis 07/23/2015   Overview:  x 3  Last Assessment & Plan:  By [place] has left nephrolithiasis, but not visible by KUB. Will check CT renal colic next year to assess both stone burden as well as to surveil AML.   Overview:  x 3  Last Assessment & Plan:  Has 70m  nonobstructing LUP stone - not visible by KUB.  Will check renal US 07/2018 - he will contact office sooner if symptomatic.   . Non-sustained ventricular tachycardia ([place]) 03/29/2017  . Nonsustained ventricular tachycardia ([place]) 03/29/2017  . Other hyperlipidemia 03/29/2017  . Palpitations 10/01/2017  . Pleural effusion, bilateral 08/09/2017  . Pneumonia due to COVID-19 virus 02/07/2021  . Post-nasal drainage 02/25/2016   Last Assessment & Plan:  Trial zyrtec and flonase  . Prostate cancer screening 06/20/2017   Last Assessment & Plan:  Recommend continued annual CaP screening until within 10 years of life expectancy. Given good health and fhx of longevity, would anticipate CaP screening to continue until age 33.  PSA today and again in one year on day of visit.  . Pulmonary arterial hypertension ([place]) 02/20/2018  . Pulmonary edema   . Pulmonary hypertension ([place]) 08/09/2017  . Pulmonary nodules 06/12/2016  . S/P AVR 03/20/2016  . S/P AVR (aortic valve replacement)  03/20/2016  . Strain of deltoid muscle, initial encounter   . Supratherapeutic INR 07/26/2017  . Syncope and collapse 02/01/2016  . Typical atrial flutter ([place]) 02/01/2016    Past Surgical History:  Procedure Laterality Date  . CHOLECYSTECTOMY    . CORONARY ARTERY BYPASS GRAFT    . EXPLORATORY LAPAROTOMY  07/30/2017  . FOOT SURGERY    . FRACTURE SURGERY Right    wrist and forearm  . HERNIA REPAIR    . MECHANICAL AORTIC VALVE REPLACEMENT    . NASAL SINUS SURGERY    . RIGHT HEART CATH N/A 01/18/2018   Procedure: RIGHT HEART CATH;  Surgeon: Jolaine Artist, MD;  Location: [facility];  Service: Cardiovascular;  Laterality: N/A;  . RIGHT HEART CATH N/A 05/14/2018   Procedure: RIGHT HEART CATH;  Surgeon: Jolaine Artist, MD;  Location: [facility];  Service: Cardiovascular;  Laterality: N/A;  . TEE WITHOUT CARDIOVERSION N/A 05/21/2018   Procedure: TRANSESOPHAGEAL ECHOCARDIOGRAM (TEE);  Surgeon: Jolaine Artist, MD;  Location: [facility];  Service: Cardiovascular;  Laterality: N/A;  . UPPER GASTROINTESTINAL ENDOSCOPY  07/12/2017   Patchy areas of mucosal inflammation noted in the antrum with edema,erthema and ulcerations. Bx. Chronicfocally active gastritis.  [name] VASECTOMY      Current Medications: Current Meds  Medication Sig  . acetaminophen (TYLENOL) 650 MG CR tablet Take 1 tablet (650 mg total) by mouth every 8 (eight) hours as needed for pain. Not to use with norco since combined with tylenol.  [name] albuterol (VENTOLIN HFA) 108 (90 Base) MCG/ACT inhaler Inhale 2 puffs into the lungs every 6 (six) hours as needed for wheezing or shortness of breath.  [name] aspirin EC 81 MG tablet Take 81 mg by mouth daily.  . B Complex-C (B-COMPLEX WITH VITAMIN C) tablet Take 1 tablet by mouth daily. Unknown strength  . cetirizine (ZYRTEC) 10 MG tablet Take 10 mg by mouth daily.  . Cholecalciferol (VITAMIN D) 125 MCG (5000 UT) CAPS Take 1 capsule by mouth daily.  . Coenzyme Q10 (COQ10 PO)  Take 1 capsule by mouth daily.  [name] KRILL OIL PO Take 1 capsule by mouth daily.  [name] losartan (COZAAR) 50 MG tablet Take 50 mg by mouth daily.  . Magnesium Gluconate (MAGNESIUM 27 PO) Take 1 tablet by mouth every other day.   . metolazone (ZAROXOLYN) 2.5 MG tablet Take 2.5 mg by mouth as needed (fluid retention).  . midodrine (PROAMATINE) 10 MG tablet Take 1 tablet (10 mg total) by mouth 3 (three) times daily  with meals.  . OPSUMIT 10 MG tablet TAKE 1 TABLET DAILY  . potassium chloride SA (KLOR-CON) 20 MEQ tablet Take 2 tablets (40 mEq total) by mouth 2 (two) times daily.  . rosuvastatin (CRESTOR) 5 MG tablet TAKE 1 TABLET(5 MG) BY MOUTH DAILY  . Selexipag (UPTRAVI) 400 MCG TABS Take 1 tablet by mouth. 200 mcg in pm  . spironolactone (ALDACTONE) 25 MG tablet TAKE 1/2 TABLET BY MOUTH EVERY DAY  . torsemide (DEMADEX) 20 MG tablet Take 60 mg daily alternating with 40 mg daily  . Vitamin D, Ergocalciferol, (DRISDOL) 1.25 MG (50000 UNIT) CAPS capsule TAKE 1 CAPSULE BY MOUTH EVERY 7 DAYS  . warfarin (COUMADIN) 5 MG tablet TAKE 1/2 TO 1 TABLET DAILY AS DIRECTED BY COUMADIN CLINIC (Patient taking differently: Take 2.5 mg by mouth as directed. TAKE 1/2 TO 1 TABLET DAILY AS DIRECTED BY COUMADIN CLINIC)     Allergies:   Amoxicillin-pot clavulanate, Pantoprazole sodium, Tape, and Wound dressing adhesive   Social History   Socioeconomic History  . Marital status: Single    Spouse name: Not on file  . Number of children: Not on file  . Years of education: Not on file  . Highest education level: Not on file  Occupational History  . Not on file  Tobacco Use  . Smoking status: Former Smoker    Packs/day: 2.00    Years: 34.00    Pack years: 68.00    Types: Cigarettes    Quit date: 07/16/2000    Years since quitting: 20.7  . Smokeless tobacco: Never Used  Vaping Use  . Vaping Use: Never used  Substance and Sexual Activity  . Alcohol use: Not Currently  . Drug use: No  . Sexual activity: Not on file   Other Topics Concern  . Not on file  Social History Narrative  . Not on file   Social Determinants of Health   Financial Resource Strain: Low Risk   . Difficulty of Paying Living Expenses: Not hard at all  Food Insecurity: No Food Insecurity  . Worried About Charity fundraiser in the Last Year: Never true  . Ran Out of Food in the Last Year: Never true  Transportation Needs: No Transportation Needs  . Lack of Transportation (Medical): No  . Lack of Transportation (Non-Medical): No  Physical Activity: Not on file  Stress: Not on file  Social Connections: Not on file     Family History: The patient's family history includes Arthritis in his mother; Asthma in his mother; Heart attack in his father; Hypertension in his father; Stroke in his paternal grandmother. There is no history of Colon cancer.   Recent Labs: 05/17/2020: Magnesium 2.6 06/21/2020: B Natriuretic Peptide 154.4 01/27/2021: ALT 16; BUN 20; Creatinine, Ser 1.58; Hemoglobin 13.8; Platelets 197; Potassium 3.5; Sodium 134  Recent Lipid Panel    Component Value Date/Time   CHOL 133 10/12/2020 1104   TRIG 111 10/12/2020 1104   HDL 44 10/12/2020 1104   CHOLHDL 3.0 10/12/2020 1104   VLDL 11 10/21/2018 1028   LDLCALC 70 10/12/2020 1104    Physical Exam:    VS:  BP 140/80   Pulse (!) 57   Wt 67.9 kg (149 lb 12.8 oz)   SpO2 99%   BMI 21.80 kg/m     Wt Readings from Last 3 Encounters:  03/31/21 67.9 kg (149 lb 12.8 oz)  03/29/21 65.9 kg (145 lb 4 oz)  03/21/21 67.1 kg (148 lb)    General:  Well appearing. No resp difficulty HEENT: normal Neck: supple. no JVD. Carotids 2+ bilat; no bruits. No lymphadenopathy or thryomegaly appreciated. Cor: PMI nondisplaced. Mildly irregular rate & rhythm. Mechanical S2 Lungs: clear Abdomen: soft, nontender, nondistended. No hepatosplenomegaly. No bruits or masses. Good bowel sounds. Extremities: no cyanosis, clubbing, rash, edema Neuro: alert & orientedx3, cranial nerves  grossly intact. moves all 4 extremities w/o difficulty. Affect pleasant   ASSESSMENT/PLAN:    1. Pulmonary hypertension with cor pulmonale/RV failure - WHO Group I  -? Component ofHHT/shunt/AVMs with late bubbles on bubble study.  - Echo with bubble study 05/16/18 LVEF 55-60% with "late bubbles" , Mechanical AVR stable with trivial perivalvular regurg, Mild MR, Severe LAE, Severe RV dilation and reduced function. No PFO. Mod TR, PA peak pressure 68 mm Hg - Echo 10/19: EF 55-60% mild LVH; s/p AVR with mean gradient 11 mmHg and trace AI; mild MR; severe biatrial enlargement; mild RVE; severe TR; severe pulmonary hypertension. RVSP 39mHG - Echo 03/17/20 EF 45-50% AVR ok. Septal flattening RV dilated mild HK. Moderate TR RVSP 80 severe bilateral enlargement. Personally reviewed - RHC 1/19 with mod/severe pulm HTN with RV failure.  - RHC 05/14/18 with Mild/Moderate PAH in setting of high output with no evidence of intracardiac shunting. - Ab u/s with cirrhosis but no evidence of portal HTN - Improved with Macitentan and Adcirca. Uptravi now back up to 400/200. Will go up to 400 bid. Once on stable dose of Uptravi for 3 months will repeat RHC - Stable NYHA II. Recovering back after COVID - Volume status ok  - Continue torsemide 40 daily alternating with 60 daily. Using metolazone 2.553mwith K 40 meq about 1-2x week .No change - Continue spiro 12.5 mg daily. - Continue midodrine 15g bid (it was 10 tid but he says it works better this way) - Auto-immune serologies negative (checked twice) - Scheduled for echo next week with [facility]   2. Chronic respiratory failure - [place] CT 5/23/19with "dependent basilar predominant patchy subpleural reticulation and ground-glass attenuation with the suggestion of minimal associated traction bronchiolectasis. No frank honeycombing. Findings may indicate an interstitial lung disease such as early usual interstitial pneumonia (UIP) or fibrotic phase nonspecific  interstitial pneumonia (NSIP)." -Followed by Dr. ByLamonte Sakai Stable NYHA II  3. Chronic AFL - rate controlled.  Now off metoprolol. Continue coumadin with mechanical AVR. - No change  4. CAD - s/p CABG 07/2017 with Dr AlRoderic Palaun CaMonte Sereno - No s/s ischemia - continue ASA with CAD and AVR - Unable to tolerate atorva due to myalgias.  -ContinueCrestor 62m41maily. Tolerating well - Lipids followed by Dr. MunBettina Gavia. S/p mechanical AVR - stable on echo 3/21 Continue coumadin/ASA 81. INR goal is 2.5-3 per Dr MunBettina Gavia aware of need for SBE prophylaxis - repeat echo next week   6. Leukocytoclastic vasculitis - f/u with Rheumatology. No changes  7. Cirrhosis - US [place] RUQ 05/16/18 - s/p cholecystectomy. + hepatic cirrhosis. Mild ascites.  - Liver Doppler 05/16/18 - No hepatic, splenic, or portal venous thrombosis or occlusion. Mild ascites. - Continue midodrine 15g bid (it was 10 tid but he says it works better this way)   8. Iron-def anemia - followed by PCP   DanGlori BickersD  3:24 PM

## 2021-04-05 ENCOUNTER — Ambulatory Visit (INDEPENDENT_AMBULATORY_CARE_PROVIDER_SITE_OTHER): Payer: Medicare Other | Admitting: Nurse Practitioner

## 2021-04-05 VITALS — BP 139/83 | HR 61 | Temp 97.9°F | Resp 18

## 2021-04-05 DIAGNOSIS — R0602 Shortness of breath: Secondary | ICD-10-CM

## 2021-04-05 DIAGNOSIS — Z8616 Personal history of COVID-19: Secondary | ICD-10-CM | POA: Diagnosis not present

## 2021-04-05 NOTE — Patient Instructions (Signed)
Pneumonia due to COVID-19 virus Cough:   Stay well hydrated  Stay active  Deep breathing exercises  May take tylenol or fever or pain  May take mucinex twice daily  Will order chest x ray: 02/26/21  [facility] [address], [place] [address] [phone] MON - FRI 8:00 AM - 4:00 PM - WALK IN   Follow up:  Follow up in 4 weeks or sooner if needed

## 2021-04-05 NOTE — Assessment & Plan Note (Signed)
Cough:   Stay well hydrated  Stay active  Deep breathing exercises  May take tylenol or fever or pain  May take mucinex twice daily  Will order chest x ray: 02/26/21  [facility] [address], [place] [address] [phone] MON - FRI 8:00 AM - 4:00 PM - WALK IN   Follow up:  Follow up in 4 weeks or sooner if needed

## 2021-04-05 NOTE — Progress Notes (Signed)
_0  ID: Lance Hicks, male    DOB: 11/30/1946, 75 y.o.   MRN: [id number]  Chief Complaint  Patient presents with  . Follow-up    improving     Referring provider: Saguier, Iris Pert  HPI  Patient presents today for post COVID care clinic visit follow-up.  Patient states he is back working out at the gym.  Overall he is slowly improving.  We discussed that he will need repeat imaging in about 3 weeks.  No new issues or concerns today. Denies f/c/s, n/v/d, hemoptysis, PND, chest pain or edema.     Allergies  Allergen Reactions  . Amoxicillin-Pot Clavulanate Diarrhea and Anaphylaxis    Stomach pain Has patient had a PCN reaction causing immediate rash, facial/tongue/throat swelling, SOB or lightheadedness with hypotension: No Has patient had a PCN reaction causing severe rash involving mucus membranes or skin necrosis: No Has patient had a PCN reaction that required hospitalization: No Has patient had a PCN reaction occurring within the last 10 years: Yes If all of the above answers are "NO", then may proceed with Cephalosporin use.  Stomach pain Has patient had a PCN reaction causing immediate rash, facial/tongue/throat swelling, SOB or lightheadedness with hypotension: No Has patient had a PCN reaction causing severe rash involving mucus membranes or skin necrosis: No Has patient had a PCN reaction that required hospitalization: No Has patient had a PCN reaction occurring within the last 10 years: Yes If all of the above answers are "NO", then may proceed with Cephalosporin use. n/v  . Pantoprazole Sodium Nausea Only    Gets gassy and starting itching like crazy Gets gassy and starting itching like crazy  . Tape Rash and Other (See Comments)    Surgical tape  . Wound Dressing Adhesive Other (See Comments) and Rash    Surgical tape Surgical tape Surgical tape    Immunization History  Administered Date(s) Administered  . Fluad Quad(high Dose 65+) 09/20/2020   . Influenza, High Dose Seasonal PF 09/05/2018  . Influenza,inj,Quad PF,6+ Mos 10/13/2015, 09/19/2016, 10/09/2019  . Moderna Sars-Covid-2 Vaccination 02/19/2020, 03/22/2020, 08/13/2020  . Pneumococcal Conjugate-13 11/28/2017  . Pneumococcal Polysaccharide-23 02/03/2016, 12/06/2018  . Tdap 08/13/2019    Past Medical History:  Diagnosis Date  . Allergic rhinitis 04/28/2016   Last Assessment & Plan:  Continue astelin  . Anemia 07/01/2019  . 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 In light of concurrent left nephrolithiasis, will check CT renal colic next year instead of renal US.   [name] Anticoagulated on Coumadin 01/04/2018  . Anxiety 05/10/2016   Last Assessment & Plan:  Doing well off of zoloft.  . Asthma   . Asymptomatic microscopic hematuria 06/20/2017   Last Assessment & Plan:  Had hematuria workup in [facility] in 2016 which negative CT and cystoscopy. UA with 2+ blood last visit - we discussed recommendation for repeat workup at 5 years or if degree of hematuria progresses.   . Atrial fibrillation ([place]) 03/29/2017  . Atrial flutter ([place]) 03/29/2017  . Backache 12/14/2015   Last Assessment & Plan:  Pain management referral for further evaluation.  . Bilateral pleural effusion 08/09/2017  . CHF (congestive heart failure) ([place])   . Chronic allergic rhinitis 04/28/2016   Last Assessment & Plan:  Continue astelin  . Chronic anticoagulation 03/29/2017  . Chronic atrial fibrillation ([place]) 07/23/2015   Last Assessment & Plan:  Coumadin and metoprolol, cardiology referral to establish care.  [name]  Chronic midline back pain 12/14/2015   Last Assessment & Plan:  Pain management referral for further evaluation.  . Chronic obstructive lung disease ([place]) 06/12/2016   With hypoxia  . Chronic prostatitis 07/23/2015   Last Assessment & Plan:  Has largely resolved since stopping bike riding. Recommend annual DRE AND PSA - will see back 12/2015 for annual screening,  given 1st degree fhx. To call office for recurrent prostatitis symptoms.   . Chronic respiratory failure with hypoxia ([place]) 07/29/2019  . Cirrhosis of liver not due to alcohol ([place]) 07/01/2019  . COPD (chronic obstructive pulmonary disease) ([place]) 06/12/2016  . Coronary arteriosclerosis in native artery 03/29/2017  . Coronary artery disease involving native coronary artery of native heart with angina pectoris ([place]) 03/29/2017  . Cough 10/17/2016   Last Assessment & Plan:  Discussed typical course for acute viral illness. If symptoms worsen or fail to improve by 7-10d, delayed ATBs, fluids, rest, NSAIDs/APAP prn. Seek care if not improving. Needs earlier INR check due to ATBs.  [name] Dyspnea 02/01/2016   Last Assessment & Plan:  Overall improving, eval by pulm, plan for CT, neg stress test with cardiology. Recent switch to carvedilol due to side effects.  . Encounter for therapeutic drug monitoring 01/06/2019  . Enterococcal bacteremia   . Epidermoid cyst of skin 08/24/2017  . Essential hypertension 12/14/2015   Last Assessment & Plan:  Hypertension control: controlled  Medications: compliant Medication Management: as noted in orders Home blood pressure monitoring recommended additionally as needed for symptoms  The patient's care plan was reviewed and updated. Instructions and counseling were provided regarding patient goals and barriers. He was counseled to adopt a healthy lifestyle. Educational resources and self-management tools have been provided as charted in [facility] list.   . H/O maze procedure 03/29/2017  . [name] 03/29/2017   Overview:  2011  . History of coronary artery bypass graft 03/29/2017  . Hx of CABG 03/29/2017  . Hyperlipidemia 03/29/2017  . Hypersensitivity angiitis ([place]) 10/01/2017  . Hypertensive heart disease 03/29/2017  . Hypertensive heart disease with heart failure ([place]) 03/29/2017  . Hypertensive heart failure ([place]) 03/29/2017  . Hypokalemia due to excessive renal loss of  potassium 02/18/2018  . Hypotension, chronic 06/06/2018  . International normalized ratio (INR) raised 07/26/2017  . Kidney stone 07/23/2015  . Kidney stones 07/23/2015   Overview:  x 3  Last Assessment & Plan:  By [place] has left nephrolithiasis, but not visible by KUB. Will check CT renal colic next year to assess both stone burden as well as to surveil AML.   [name] Left ureteral stone 01/23/2018  . Leukocytoclastic vasculitis ([place]) 10/01/2017  . Localized edema 01/04/2018  . Long term (current) use of anticoagulants 03/29/2017  . Lumbar radicular pain 01/19/2016  . Lumbar radiculopathy 01/19/2016  . Maculopapular rash 09/03/2017  . Microscopic hematuria 06/20/2017   Last Assessment & Plan:  Had hematuria workup in [facility] in 2016 which negative CT and cystoscopy. UA with 2+ blood last visit - we discussed recommendation for repeat workup at 5 years or if degree of hematuria progresses.   . Multiple nodules of lung 06/12/2016  . Nasal discharge 02/25/2016   Last Assessment & Plan:  Trial zyrtec and flonase  . Nephrolithiasis 07/23/2015   Overview:  x 3  Last Assessment & Plan:  By [place] has left nephrolithiasis, but not visible by KUB. Will check CT renal colic next year to assess both stone burden as well as to surveil AML.  Overview:  x 3  Last Assessment & Plan:  Has 51m nonobstructing LUP stone - not visible by KUB.  Will check renal [place] - he will contact office sooner if symptomatic.   . Non-sustained ventricular tachycardia ([place] 03/29/2017  . Nonsustained ventricular tachycardia ([place] 03/29/2017  . Other hyperlipidemia 03/29/2017  . Palpitations 10/01/2017  . Pleural effusion, bilateral 08/09/2017  . Pneumonia due to COVID-19 virus 02/07/2021  . Post-nasal drainage 02/25/2016   Last Assessment & Plan:  Trial zyrtec and flonase  . Prostate cancer screening 06/20/2017   Last Assessment & Plan:  Recommend continued annual CaP screening until within 10 years of life expectancy. Given good health and fhx of longevity,  would anticipate CaP screening to continue until age 75  PSA today and again in one year on day of visit.  . Pulmonary arterial hypertension ([place] 02/20/2018  . Pulmonary edema   . Pulmonary hypertension ([place] 08/09/2017  . Pulmonary nodules 06/12/2016  . S/P AVR 03/20/2016  . S/P AVR (aortic valve replacement) 03/20/2016  . Strain of deltoid muscle, initial encounter   . Supratherapeutic INR 07/26/2017  . Syncope and collapse 02/01/2016  . Typical atrial flutter ([place] 02/01/2016    Tobacco History: Social History   Tobacco Use  Smoking Status Former Smoker  . Packs/day: 2.00  . Years: 34.00  . Pack years: 68.00  . Types: Cigarettes  . Quit date: 07/16/2000  . Years since quitting: 20.7  Smokeless Tobacco Never Used   Counseling given: Yes   Outpatient Encounter Medications as of 04/05/2021  Medication Sig  . acetaminophen (TYLENOL) 650 MG CR tablet Take 1 tablet (650 mg total) by mouth every 8 (eight) hours as needed for pain. Not to use with norco since combined with tylenol.  .Marland Kitchenalbuterol (VENTOLIN HFA) 108 (90 Base) MCG/ACT inhaler Inhale 2 puffs into the lungs every 6 (six) hours as needed for wheezing or shortness of breath.  .Marland Kitchenaspirin EC 81 MG tablet Take 81 mg by mouth daily.  . B Complex-C (B-COMPLEX WITH VITAMIN C) tablet Take 1 tablet by mouth daily. Unknown strength  . cetirizine (ZYRTEC) 10 MG tablet Take 10 mg by mouth daily.  . Cholecalciferol (VITAMIN D) 125 MCG (5000 UT) CAPS Take 1 capsule by mouth daily.  . Coenzyme Q10 (COQ10 PO) Take 1 capsule by mouth daily.  .Marland KitchenKRILL OIL PO Take 1 capsule by mouth daily.  .Marland Kitchenlosartan (COZAAR) 50 MG tablet Take 50 mg by mouth daily.  . Magnesium Gluconate (MAGNESIUM 27 PO) Take 1 tablet by mouth every other day.   . metolazone (ZAROXOLYN) 2.5 MG tablet Take 1 tablet (2.5 mg total) by mouth daily as needed (fluid retention).  . midodrine (PROAMATINE) 10 MG tablet Take 1 tablet (10 mg total) by mouth 3 (three) times daily with meals.   . OPSUMIT 10 MG tablet TAKE 1 TABLET DAILY  . potassium chloride SA (KLOR-CON) 20 MEQ tablet Take 2 tablets (40 mEq total) by mouth 2 (two) times daily.  . rosuvastatin (CRESTOR) 5 MG tablet TAKE 1 TABLET(5 MG) BY MOUTH DAILY  . Selexipag (UPTRAVI) 400 MCG TABS Take 400 mcg by mouth in the morning and at bedtime.  .Marland Kitchenspironolactone (ALDACTONE) 25 MG tablet TAKE 1/2 TABLET BY MOUTH EVERY DAY  . torsemide (DEMADEX) 20 MG tablet Take 60 mg daily alternating with 40 mg daily  . Vitamin D, Ergocalciferol, (DRISDOL) 1.25 MG (50000 UNIT) CAPS capsule TAKE 1 CAPSULE BY MOUTH EVERY 7 DAYS  . warfarin (  COUMADIN) 5 MG tablet TAKE 1/2 TO 1 TABLET DAILY AS DIRECTED BY COUMADIN CLINIC (Patient taking differently: Take 2.5 mg by mouth as directed. TAKE 1/2 TO 1 TABLET DAILY AS DIRECTED BY COUMADIN CLINIC)   No facility-administered encounter medications on file as of 04/05/2021.     Review of Systems  Review of Systems  Constitutional: Negative.  Negative for fatigue and fever.  HENT: Negative.   Respiratory: Positive for shortness of breath. Negative for cough.   Cardiovascular: Negative.  Negative for chest pain, palpitations and leg swelling.  Gastrointestinal: Negative.   Allergic/Immunologic: Negative.   Neurological: Negative.   Psychiatric/Behavioral: Negative.        Physical Exam  BP 139/83   Pulse 61   Temp 97.9 F (36.6 C)   Resp 18   SpO2 95%   Wt Readings from Last 5 Encounters:  03/31/21 149 lb 12.8 oz (67.9 kg)  03/29/21 145 lb 4 oz (65.9 kg)  03/21/21 148 lb (67.1 kg)  01/31/21 146 lb (66.2 kg)  01/24/21 146 lb (66.2 kg)     Physical Exam Vitals and nursing note reviewed.  Constitutional:      General: He is not in acute distress.    Appearance: He is well-developed.  Cardiovascular:     Rate and Rhythm: Normal rate and regular rhythm.  Pulmonary:     Effort: Pulmonary effort is normal.     Breath sounds: Normal breath sounds.  Musculoskeletal:     Right  lower leg: No edema.     Left lower leg: No edema.  Skin:    General: Skin is warm and dry.  Neurological:     Mental Status: He is alert and oriented to person, place, and time.  Psychiatric:        Mood and Affect: Mood normal.        Behavior: Behavior normal.       Assessment & Plan:   History of COVID-19 Cough:   Stay well hydrated  Stay active  Deep breathing exercises  May take tylenol or fever or pain  May take mucinex twice daily  Will order chest x ray: 02/26/21  [facility] [address], [place] [address] [phone] MON - FRI 8:00 AM - 4:00 PM - WALK IN   Follow up:  Follow up in 4 weeks or sooner if needed      Fenton Foy, NP 04/05/2021

## 2021-04-07 ENCOUNTER — Other Ambulatory Visit: Payer: Self-pay

## 2021-04-07 ENCOUNTER — Ambulatory Visit (HOSPITAL_BASED_OUTPATIENT_CLINIC_OR_DEPARTMENT_OTHER)
Admission: RE | Admit: 2021-04-07 | Discharge: 2021-04-07 | Disposition: A | Discharge status: Home / Self Care | Payer: Medicare Other | Source: Ambulatory Visit | Attending: Family Medicine | Admitting: Family Medicine

## 2021-04-07 DIAGNOSIS — R1900 Intra-abdominal and pelvic swelling, mass and lump, unspecified site: Secondary | ICD-10-CM | POA: Diagnosis not present

## 2021-04-12 ENCOUNTER — Ambulatory Visit (INDEPENDENT_AMBULATORY_CARE_PROVIDER_SITE_OTHER): Payer: Medicare Other

## 2021-04-12 ENCOUNTER — Other Ambulatory Visit: Payer: Self-pay

## 2021-04-12 DIAGNOSIS — Z952 Presence of prosthetic heart valve: Secondary | ICD-10-CM | POA: Diagnosis not present

## 2021-04-12 DIAGNOSIS — Z951 Presence of aortocoronary bypass graft: Secondary | ICD-10-CM

## 2021-04-12 DIAGNOSIS — I25119 Atherosclerotic heart disease of native coronary artery with unspecified angina pectoris: Secondary | ICD-10-CM | POA: Diagnosis not present

## 2021-04-12 DIAGNOSIS — I483 Typical atrial flutter: Secondary | ICD-10-CM

## 2021-04-12 LAB — ECHOCARDIOGRAM COMPLETE
AR max vel: 1.33 cm2
AV Area VTI: 1.45 cm2
AV Area mean vel: 1.3 cm2
AV Mean grad: 11 mmHg
AV Peak grad: 22.5 mmHg
Ao pk vel: 2.37 m/s
Area-P 1/2: 3.07 cm2
Calc EF: 47.1 %
Radius: 0.6 cm
S' Lateral: 4 cm
Single Plane A2C EF: 39.9 %
Single Plane A4C EF: 51.4 %

## 2021-04-12 NOTE — Progress Notes (Signed)
Complete echocardiogram performed.  Jimmy Markiya Keefe RDCS, RVT

## 2021-04-15 ENCOUNTER — Telehealth: Payer: Self-pay | Admitting: Cardiology

## 2021-04-15 NOTE — Telephone Encounter (Signed)
Pt is returning a call from 04/14/21 for his Echo test results done on  04/12/21. Pt would like a return call on his cell phone

## 2021-04-15 NOTE — Telephone Encounter (Signed)
Left message on patients voicemail to please return our call.   

## 2021-04-18 NOTE — Telephone Encounter (Signed)
Patient informed of results.  

## 2021-04-19 ENCOUNTER — Other Ambulatory Visit: Payer: Self-pay

## 2021-04-19 ENCOUNTER — Ambulatory Visit (INDEPENDENT_AMBULATORY_CARE_PROVIDER_SITE_OTHER): Payer: Medicare Other

## 2021-04-19 DIAGNOSIS — I4892 Unspecified atrial flutter: Secondary | ICD-10-CM | POA: Diagnosis not present

## 2021-04-19 DIAGNOSIS — Z7901 Long term (current) use of anticoagulants: Secondary | ICD-10-CM | POA: Diagnosis not present

## 2021-04-19 LAB — POCT INR: INR: 3.2 — AB (ref 2.0–3.0)

## 2021-04-19 NOTE — Patient Instructions (Signed)
Continue taking 1/2 tablet daily except 1 tablet on Sunday, Wednesday and Friday..  Please call our office with any medication changes or concerns [phone]. Return in 6 weeks for INR check.

## 2021-04-28 ENCOUNTER — Other Ambulatory Visit: Payer: Self-pay

## 2021-04-28 ENCOUNTER — Ambulatory Visit
Admission: RE | Admit: 2021-04-28 | Discharge: 2021-04-28 | Disposition: A | Discharge status: Home / Self Care | Payer: Medicare Other | Source: Ambulatory Visit | Attending: Nurse Practitioner | Admitting: Nurse Practitioner

## 2021-04-28 DIAGNOSIS — R918 Other nonspecific abnormal finding of lung field: Secondary | ICD-10-CM | POA: Diagnosis not present

## 2021-04-28 DIAGNOSIS — R0602 Shortness of breath: Secondary | ICD-10-CM

## 2021-04-28 DIAGNOSIS — Z8616 Personal history of COVID-19: Secondary | ICD-10-CM

## 2021-04-28 DIAGNOSIS — Z8701 Personal history of pneumonia (recurrent): Secondary | ICD-10-CM | POA: Diagnosis not present

## 2021-05-02 ENCOUNTER — Other Ambulatory Visit: Payer: Self-pay | Admitting: Nurse Practitioner

## 2021-05-02 ENCOUNTER — Telehealth: Payer: Self-pay | Admitting: Nurse Practitioner

## 2021-05-02 DIAGNOSIS — M542 Cervicalgia: Secondary | ICD-10-CM | POA: Diagnosis not present

## 2021-05-02 DIAGNOSIS — M25511 Pain in right shoulder: Secondary | ICD-10-CM | POA: Diagnosis not present

## 2021-05-02 DIAGNOSIS — U071 COVID-19: Secondary | ICD-10-CM

## 2021-05-02 DIAGNOSIS — J1282 Pneumonia due to coronavirus disease 2019: Secondary | ICD-10-CM

## 2021-05-06 ENCOUNTER — Telehealth: Payer: Self-pay | Admitting: Emergency Medicine

## 2021-05-06 ENCOUNTER — Telehealth: Payer: Self-pay | Admitting: Cardiology

## 2021-05-06 DIAGNOSIS — J849 Interstitial pulmonary disease, unspecified: Secondary | ICD-10-CM

## 2021-05-06 NOTE — Telephone Encounter (Signed)
Called patient, left voice mail. I'll try him back next week

## 2021-05-06 NOTE — Telephone Encounter (Signed)
Called patient back. He was seen in a covd clinic recently. He was told he may need a CT but they have not called them back yet I advised him to call the covid clinic back. He wants to call pulmonologist instead gave him the number to do that as well. No further questions.

## 2021-05-06 NOTE — Telephone Encounter (Signed)
Patient states he had covid and pneumonia at the same time. He states the doctor at the covid clinic did not believe the pneumonia had left his lower left lung yet. He states they wanted him to have a CT, but he has not heard anything. I did not see any orders for a CT and he would like to know if Dr. Agustin Cree could order it.

## 2021-05-06 NOTE — Telephone Encounter (Signed)
ATC, left VM

## 2021-05-06 NOTE — Telephone Encounter (Signed)
Called and spoke with patient who states he is being treated with the [facility] Covid clinic. His Xrays have come back inconclusive but his pneumonia is persisting. Wants to know what his next steps should be.  CXR done on 04/28/2021.  Dr. Lamonte Sakai please advise

## 2021-05-10 NOTE — Telephone Encounter (Signed)
Pt returning a phone call from Rb. Pt can be reached at [phone]

## 2021-05-10 NOTE — Telephone Encounter (Signed)
Routing to Dr. Lamonte Sakai as he was the one who had called pt prior.

## 2021-05-11 ENCOUNTER — Encounter: Payer: Self-pay | Admitting: *Deleted

## 2021-05-11 DIAGNOSIS — M25511 Pain in right shoulder: Secondary | ICD-10-CM | POA: Diagnosis not present

## 2021-05-11 DIAGNOSIS — M542 Cervicalgia: Secondary | ICD-10-CM | POA: Diagnosis not present

## 2021-05-11 NOTE — Telephone Encounter (Signed)
I ordered the high res CT. Please set him up for an OV w RB with PFT on the same day.

## 2021-05-11 NOTE — Telephone Encounter (Signed)
I was able to reach the patient, discuss his status, the findings with him.  He had COVID in February.  Has had persistent infiltrates on repeat chest x-ray both in March and in May.  No evidence of active disease.  I am concerned that he probably has some residual COVID interstitial change, scar.  I think we need to quantify this, perform a CT scan of his chest.  I will also repeat his pulmonary function testing so that we can compare with 2019.

## 2021-05-11 NOTE — Telephone Encounter (Signed)
Called the patient, left message on VM.

## 2021-05-11 NOTE — Telephone Encounter (Signed)
ATC patient x1, vm left for patient to return call to schedule OV with PFT on same day with Dr. Lamonte Sakai.  Will await return call.

## 2021-05-12 NOTE — Telephone Encounter (Signed)
ATC x2, LMTCB to schedule OV with Dr. Lamonte Sakai and have a PFT.  Will await return call.

## 2021-05-17 DIAGNOSIS — M25511 Pain in right shoulder: Secondary | ICD-10-CM | POA: Diagnosis not present

## 2021-05-17 DIAGNOSIS — M542 Cervicalgia: Secondary | ICD-10-CM | POA: Diagnosis not present

## 2021-05-18 NOTE — Telephone Encounter (Signed)
lmtcb for pt.  Pt needs OV with RB either on same day as PFT or after PFT.

## 2021-05-20 ENCOUNTER — Other Ambulatory Visit: Payer: Self-pay | Admitting: Emergency Medicine

## 2021-05-20 ENCOUNTER — Other Ambulatory Visit (HOSPITAL_COMMUNITY)
Admission: RE | Admit: 2021-05-20 | Discharge: 2021-05-20 | Disposition: A | Discharge status: Home / Self Care | Payer: Medicare Other | Source: Ambulatory Visit | Attending: Emergency Medicine | Admitting: Emergency Medicine

## 2021-05-20 DIAGNOSIS — Z20822 Contact with and (suspected) exposure to covid-19: Secondary | ICD-10-CM | POA: Diagnosis not present

## 2021-05-20 DIAGNOSIS — Z01812 Encounter for preprocedural laboratory examination: Secondary | ICD-10-CM | POA: Insufficient documentation

## 2021-05-20 DIAGNOSIS — J849 Interstitial pulmonary disease, unspecified: Secondary | ICD-10-CM

## 2021-05-21 ENCOUNTER — Ambulatory Visit
Admission: RE | Admit: 2021-05-21 | Discharge: 2021-05-21 | Disposition: A | Discharge status: Home / Self Care | Payer: Medicare Other | Source: Ambulatory Visit | Attending: Emergency Medicine | Admitting: Emergency Medicine

## 2021-05-21 DIAGNOSIS — R918 Other nonspecific abnormal finding of lung field: Secondary | ICD-10-CM | POA: Diagnosis not present

## 2021-05-21 DIAGNOSIS — J849 Interstitial pulmonary disease, unspecified: Secondary | ICD-10-CM

## 2021-05-21 LAB — SARS CORONAVIRUS 2 (TAT 6-24 HRS): SARS Coronavirus 2: NEGATIVE

## 2021-05-24 ENCOUNTER — Other Ambulatory Visit: Payer: Self-pay

## 2021-05-24 ENCOUNTER — Ambulatory Visit (INDEPENDENT_AMBULATORY_CARE_PROVIDER_SITE_OTHER): Payer: Medicare Other | Admitting: Emergency Medicine

## 2021-05-24 DIAGNOSIS — J849 Interstitial pulmonary disease, unspecified: Secondary | ICD-10-CM | POA: Diagnosis not present

## 2021-05-24 LAB — PULMONARY FUNCTION TEST
DL/VA % pred: 42 %
DL/VA: 1.71 ml/min/mmHg/L
DLCO cor % pred: 34 %
DLCO cor: 8.49 ml/min/mmHg
DLCO unc % pred: 34 %
DLCO unc: 8.49 ml/min/mmHg
FEF 25-75 Post: 2.45 L/sec
FEF 25-75 Pre: 2.37 L/sec
FEF2575-%Change-Post: 3 %
FEF2575-%Pred-Post: 112 %
FEF2575-%Pred-Pre: 108 %
FEV1-%Change-Post: 1 %
FEV1-%Pred-Post: 88 %
FEV1-%Pred-Pre: 86 %
FEV1-Post: 2.62 L
FEV1-Pre: 2.59 L
FEV1FVC-%Change-Post: 5 %
FEV1FVC-%Pred-Pre: 110 %
FEV6-%Change-Post: -3 %
FEV6-%Pred-Post: 80 %
FEV6-%Pred-Pre: 83 %
FEV6-Post: 3.1 L
FEV6-Pre: 3.22 L
FEV6FVC-%Change-Post: 0 %
FEV6FVC-%Pred-Post: 106 %
FEV6FVC-%Pred-Pre: 106 %
FVC-%Change-Post: -4 %
FVC-%Pred-Post: 75 %
FVC-%Pred-Pre: 78 %
FVC-Post: 3.1 L
FVC-Pre: 3.23 L
Post FEV1/FVC ratio: 85 %
Post FEV6/FVC ratio: 100 %
Pre FEV1/FVC ratio: 80 %
Pre FEV6/FVC Ratio: 100 %
RV % pred: 65 %
RV: 1.63 L
TLC % pred: 68 %
TLC: 4.66 L

## 2021-05-24 NOTE — Progress Notes (Signed)
PFT done today.

## 2021-05-26 ENCOUNTER — Telehealth: Payer: Self-pay | Admitting: Emergency Medicine

## 2021-05-26 NOTE — Telephone Encounter (Signed)
Called and spoke to pt. Pt has already had his CT chest and PFT. Pt has been now been scheduled to see Dr. Lamonte Sakai on 06/21/21 (first available opening). Pt verbalized understanding and denied any further questions or concerns at this time.    Will forward to Dr. Lamonte Sakai as Juluis Rainier.

## 2021-05-26 NOTE — Telephone Encounter (Signed)
Pt called in to see about his results of the PFT that was done on 05/31 and the CT scan that was done 05/28.  RB please advise.

## 2021-05-31 ENCOUNTER — Other Ambulatory Visit: Payer: Self-pay

## 2021-05-31 ENCOUNTER — Ambulatory Visit (INDEPENDENT_AMBULATORY_CARE_PROVIDER_SITE_OTHER): Payer: Medicare Other

## 2021-05-31 DIAGNOSIS — I4892 Unspecified atrial flutter: Secondary | ICD-10-CM | POA: Diagnosis not present

## 2021-05-31 DIAGNOSIS — Z7901 Long term (current) use of anticoagulants: Secondary | ICD-10-CM

## 2021-05-31 LAB — POCT INR: INR: 2.4 (ref 2.0–3.0)

## 2021-05-31 NOTE — Patient Instructions (Signed)
Continue taking 1/2 tablet daily except 1 tablet on Sunday, Wednesday and Friday..  Please call our office with any medication changes or concerns [phone]. Return in 6 weeks for INR check.  

## 2021-06-03 ENCOUNTER — Other Ambulatory Visit (HOSPITAL_COMMUNITY): Payer: Self-pay

## 2021-06-03 MED ORDER — METOLAZONE 2.5 MG PO TABS: 2.5000 mg | ORAL_TABLET | Freq: Every day | ORAL | 0 refills | Status: DC | PRN

## 2021-06-17 ENCOUNTER — Other Ambulatory Visit (HOSPITAL_BASED_OUTPATIENT_CLINIC_OR_DEPARTMENT_OTHER): Payer: Self-pay

## 2021-06-17 ENCOUNTER — Ambulatory Visit (INDEPENDENT_AMBULATORY_CARE_PROVIDER_SITE_OTHER): Payer: Medicare Other | Admitting: Medical

## 2021-06-17 ENCOUNTER — Telehealth: Payer: Self-pay | Admitting: Medical

## 2021-06-17 ENCOUNTER — Other Ambulatory Visit: Payer: Self-pay

## 2021-06-17 VITALS — BP 135/80 | HR 80 | Resp 18 | Ht 69.0 in | Wt 147.8 lb

## 2021-06-17 DIAGNOSIS — I4891 Unspecified atrial fibrillation: Secondary | ICD-10-CM | POA: Diagnosis not present

## 2021-06-17 DIAGNOSIS — R109 Unspecified abdominal pain: Secondary | ICD-10-CM | POA: Diagnosis not present

## 2021-06-17 DIAGNOSIS — M791 Myalgia, unspecified site: Secondary | ICD-10-CM | POA: Diagnosis not present

## 2021-06-17 DIAGNOSIS — R6883 Chills (without fever): Secondary | ICD-10-CM

## 2021-06-17 DIAGNOSIS — R350 Frequency of micturition: Secondary | ICD-10-CM | POA: Diagnosis not present

## 2021-06-17 DIAGNOSIS — R0600 Dyspnea, unspecified: Secondary | ICD-10-CM | POA: Diagnosis not present

## 2021-06-17 DIAGNOSIS — I25119 Atherosclerotic heart disease of native coronary artery with unspecified angina pectoris: Secondary | ICD-10-CM

## 2021-06-17 DIAGNOSIS — F419 Anxiety disorder, unspecified: Secondary | ICD-10-CM

## 2021-06-17 LAB — POC URINALSYSI DIPSTICK (AUTOMATED)
Bilirubin, UA: NEGATIVE
Blood, UA: NEGATIVE
Glucose, UA: NEGATIVE
Ketones, UA: NEGATIVE
Leukocytes, UA: NEGATIVE
Nitrite, UA: NEGATIVE
Protein, UA: POSITIVE — AB
Spec Grav, UA: 1.01 (ref 1.010–1.025)
Urobilinogen, UA: 0.2 E.U./dL
pH, UA: 6 (ref 5.0–8.0)

## 2021-06-17 LAB — BRAIN NATRIURETIC PEPTIDE: Brain Natriuretic Peptide: 508 pg/mL — ABNORMAL HIGH (ref <100)

## 2021-06-17 MED ORDER — HYDROCODONE-ACETAMINOPHEN 5-325 MG PO TABS
1.0000 | ORAL_TABLET | Freq: Four times a day (QID) | ORAL | 0 refills | Status: DC | PRN
  Filled 2021-06-17: qty 8, 2d supply, fill #0

## 2021-06-17 MED ORDER — HYDROCODONE-ACETAMINOPHEN 5-325 MG PO TABS: 1.0000 | ORAL_TABLET | Freq: Four times a day (QID) | ORAL | 0 refills | Status: DC | PRN

## 2021-06-17 MED ORDER — ONDANSETRON 4 MG PO TBDP: 4.0000 mg | ORAL_TABLET | Freq: Three times a day (TID) | ORAL | 0 refills | Status: DC | PRN

## 2021-06-17 NOTE — Progress Notes (Signed)
Subjective:    Patient ID: Lance Hicks, male    DOB: [birth year], 75 y.o.   MRN: [id number]  HPI Pt in for some chills, shakes, fever, sweat and some body aches.  Overall feeling bad for about one week.   Pt has some stomach cramping and bloating. Some stomach cramp and nausea.  Pt is limiting his water intake. He states following instruction of his cardiologist. He mentions that he is working outside.   He states frustrated and anxious. Expressed financial concerns.   Pt has hx of covid vaccines and he has had covid before.  Pt has hx of chf. Pt is on metolazone and torsemide. He is using metaloazone every other day presently.   Pt has some mild cough recently. He has allergies. Did not take allergy meds.     Review of Systems  Constitutional:  Positive for chills, fatigue and fever.  HENT:  Positive for congestion. Negative for sinus pressure, sinus pain and sore throat.   Respiratory:  Positive for cough. Negative for shortness of breath and wheezing.   Cardiovascular:  Negative for chest pain and palpitations.  Gastrointestinal:  Negative for abdominal pain, diarrhea, rectal pain and vomiting.  Genitourinary:  Negative for dysuria and frequency.  Musculoskeletal:  Negative for back pain.  Skin:  Negative for rash.  Neurological:  Negative for dizziness, speech difficulty, weakness, numbness and headaches.  Hematological:  Negative for adenopathy.  Psychiatric/Behavioral:  Negative for confusion, hallucinations and sleep disturbance. The patient is nervous/anxious.     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 In light of concurrent left nephrolithiasis, will check CT renal colic next year instead of renal US.    Anticoagulated on Coumadin 01/04/2018   Anxiety 05/10/2016    Last Assessment & Plan:  Doing well off of zoloft.   Asthma    Asymptomatic microscopic hematuria 06/20/2017   Last Assessment & Plan:  Had hematuria workup in [facility] in 2016 which negative CT and cystoscopy. UA with 2+ blood last visit - we discussed recommendation for repeat workup at 5 years or if degree of hematuria progresses.    Atrial fibrillation ([place]) 03/29/2017   Atrial flutter ([place]) 03/29/2017   Backache 12/14/2015   Last Assessment & Plan:  Pain management referral for further evaluation.   Bilateral pleural effusion 08/09/2017   CHF (congestive heart failure) (HCC)    Chronic allergic rhinitis 04/28/2016   Last Assessment & Plan:  Continue astelin   Chronic anticoagulation 03/29/2017   Chronic atrial fibrillation ([place]) 07/23/2015   Last Assessment & Plan:  Coumadin and metoprolol, cardiology referral to establish care.   Chronic midline back pain 12/14/2015   Last Assessment & Plan:  Pain management referral for further evaluation.   Chronic obstructive lung disease ([place]) 06/12/2016   With hypoxia   Chronic prostatitis 07/23/2015   Last Assessment & Plan:  Has largely resolved since stopping bike riding. Recommend annual DRE AND PSA - will see back 12/2015 for annual screening, given 1st degree fhx. To call office for recurrent prostatitis symptoms.    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 If symptoms worsen or fail to improve by 7-10d, delayed ATBs, fluids, rest, NSAIDs/APAP prn. Seek care if not improving. Needs earlier INR check due to ATBs.   Dyspnea 02/01/2016   Last Assessment & Plan:  Overall  improving, eval by pulm, plan for CT, neg stress test with cardiology. Recent switch to carvedilol due to side effects.   Encounter for therapeutic drug monitoring 01/06/2019   Enterococcal bacteremia    Epidermoid cyst of skin 08/24/2017   Essential hypertension 12/14/2015   Last Assessment & Plan:  Hypertension control: controlled  Medications: compliant Medication Management: as noted in orders Home blood pressure monitoring recommended additionally as needed for symptoms  The patient's care plan was reviewed and updated. Instructions and counseling were provided regarding patient goals and barriers. He was counseled to adopt a healthy lifestyle. Educational resources and self-management tools have been provided as charted in [facility] list.    H/O maze procedure 03/29/2017   [name] 03/29/2017   Overview:  2011   History of coronary artery bypass graft 03/29/2017   Hx of CABG 03/29/2017   Hyperlipidemia 03/29/2017   Hypersensitivity angiitis ([place]) 10/01/2017   Hypertensive heart disease 03/29/2017   Hypertensive heart disease with heart failure ([place]) 03/29/2017   Hypertensive heart failure ([place]) 03/29/2017   Hypokalemia due to excessive renal loss of potassium 02/18/2018   Hypotension, chronic 06/06/2018   International normalized ratio (INR) raised 07/26/2017   Kidney stone 07/23/2015   Kidney stones 07/23/2015   Overview:  x 3  Last Assessment & Plan:  By [place] has left nephrolithiasis, but not visible by KUB. Will check CT renal colic next year to assess both stone burden as well as to surveil AML.    Left ureteral stone 01/23/2018   Leukocytoclastic vasculitis ([place]) 10/01/2017   Localized edema 01/04/2018   Long term (current) use of anticoagulants 03/29/2017   Lumbar radicular pain 01/19/2016   Lumbar radiculopathy 01/19/2016   Maculopapular rash 09/03/2017   Microscopic hematuria 06/20/2017   Last Assessment & Plan:  Had hematuria workup in [facility] in 2016 which negative CT and cystoscopy. UA  with 2+ blood last visit - we discussed recommendation for repeat workup at 5 years or if degree of hematuria progresses.    Multiple nodules of lung 06/12/2016   Nasal discharge 02/25/2016   Last Assessment & Plan:  Trial zyrtec and flonase   Nephrolithiasis 07/23/2015   Overview:  x 3  Last Assessment & Plan:  By [place] has left nephrolithiasis, but not visible by KUB. Will check CT renal colic next year to assess both stone burden as well as to surveil AML.   Overview:  x 3  Last Assessment & Plan:  Has 55m nonobstructing LUP stone - not visible by KUB.  Will check renal [place] - he will contact office sooner if symptomatic.    Non-sustained ventricular tachycardia ([place] 03/29/2017   Nonsustained ventricular tachycardia ([place] 03/29/2017   Other hyperlipidemia 03/29/2017   Palpitations 10/01/2017   Pleural effusion, bilateral 08/09/2017   Pneumonia due to COVID-19 virus 02/07/2021   Post-nasal drainage 02/25/2016   Last Assessment & Plan:  Trial zyrtec and flonase   Prostate cancer screening 06/20/2017   Last Assessment & Plan:  Recommend continued annual CaP screening until within 10 years of  life expectancy. Given good health and fhx of longevity, would anticipate CaP screening to continue until age 67.  PSA today and again in one year on day of visit.   Pulmonary arterial hypertension ([place]) 02/20/2018   Pulmonary edema    Pulmonary hypertension ([place]) 08/09/2017   Pulmonary nodules 06/12/2016   S/P AVR 03/20/2016   S/P AVR (aortic valve replacement) 03/20/2016   Strain of deltoid muscle, initial encounter    Supratherapeutic INR 07/26/2017   Syncope and collapse 02/01/2016   Typical atrial flutter ([place]) 02/01/2016     Social History   Socioeconomic History   Marital status: Single    Spouse name: Not on file   Number of children: Not on file   Years of education: Not on file   Highest education level: Not on file  Occupational History   Not on file  Tobacco Use   Smoking status: Former    Packs/day:  2.00    Years: 34.00    Pack years: 68.00    Types: Cigarettes    Quit date: 07/16/2000    Years since quitting: 20.9   Smokeless tobacco: Never  Vaping Use   Vaping Use: Never used  Substance and Sexual Activity   Alcohol use: Not Currently   Drug use: No   Sexual activity: Not on file  Other Topics Concern   Not on file  Social History Narrative   Not on file   Social Determinants of Health   Financial Resource Strain: Low Risk    Difficulty of Paying Living Expenses: Not hard at all  Food Insecurity: No Food Insecurity   Worried About Charity fundraiser in the Last Year: Never true   Ran Out of Food in the Last Year: Never true  Transportation Needs: No Transportation Needs   Lack of Transportation (Medical): No   Lack of Transportation (Non-Medical): No  Physical Activity: Not on file  Stress: Not on file  Social Connections: Not on file  Intimate Partner Violence: Not on file    Past Surgical History:  Procedure Laterality Date   CHOLECYSTECTOMY     CORONARY ARTERY BYPASS GRAFT     EXPLORATORY LAPAROTOMY  07/30/2017   FOOT SURGERY     FRACTURE SURGERY Right    wrist and forearm   HERNIA REPAIR     MECHANICAL AORTIC VALVE REPLACEMENT     NASAL SINUS SURGERY     RIGHT HEART CATH N/A 01/18/2018   Procedure: RIGHT HEART CATH;  Surgeon: Jolaine Artist, MD;  Location: [place] [facility];  Service: Cardiovascular;  Laterality: N/A;   RIGHT HEART CATH N/A 05/14/2018   Procedure: RIGHT HEART CATH;  Surgeon: Jolaine Artist, MD;  Location: [place] [facility];  Service: Cardiovascular;  Laterality: N/A;   TEE WITHOUT CARDIOVERSION N/A 05/21/2018   Procedure: TRANSESOPHAGEAL ECHOCARDIOGRAM (TEE);  Surgeon: Jolaine Artist, MD;  Location[facility];  Service: Cardiovascular;  Laterality: N/A;   UPPER GASTROINTESTINAL ENDOSCOPY  07/12/2017   Patchy areas of mucosal inflammation noted in the antrum with edema,erthema and ulcerations. Bx. Chronicfocally active  gastritis.   VASECTOMY      Family History  Problem Relation Age of Onset   Asthma Mother    Arthritis Mother    Heart attack Father    Hypertension Father    Stroke Paternal Grandmother    Colon cancer Neg Hx     Allergies  Allergen Reactions   Amoxicillin-Pot Clavulanate Diarrhea and Anaphylaxis    Stomach  pain Has patient had a PCN reaction causing immediate rash, facial/tongue/throat swelling, SOB or lightheadedness with hypotension: No Has patient had a PCN reaction causing severe rash involving mucus membranes or skin necrosis: No Has patient had a PCN reaction that required hospitalization: No Has patient had a PCN reaction occurring within the last 10 years: Yes If all of the above answers are "NO", then may proceed with Cephalosporin use.  Stomach pain Has patient had a PCN reaction causing immediate rash, facial/tongue/throat swelling, SOB or lightheadedness with hypotension: No Has patient had a PCN reaction causing severe rash involving mucus membranes or skin necrosis: No Has patient had a PCN reaction that required hospitalization: No Has patient had a PCN reaction occurring within the last 10 years: Yes If all of the above answers are "NO", then may proceed with Cephalosporin use. n/v   Pantoprazole Sodium Nausea Only    Gets gassy and starting itching like crazy Gets gassy and starting itching like crazy   Tape Rash and Other (See Comments)    Surgical tape   Wound Dressing Adhesive Other (See Comments) and Rash    Surgical tape Surgical tape Surgical tape    Current Outpatient Medications on File Prior to Visit  Medication Sig Dispense Refill   acetaminophen (TYLENOL) 650 MG CR tablet Take 1 tablet (650 mg total) by mouth every 8 (eight) hours as needed for pain. Not to use with norco since combined with tylenol. 30 tablet 0   albuterol (VENTOLIN HFA) 108 (90 Base) MCG/ACT inhaler Inhale 2 puffs into the lungs every 6 (six) hours as needed for wheezing or  shortness of breath. 8 g 6   aspirin EC 81 MG tablet Take 81 mg by mouth daily.     B Complex-C (B-COMPLEX WITH VITAMIN C) tablet Take 1 tablet by mouth daily. Unknown strength     cetirizine (ZYRTEC) 10 MG tablet Take 10 mg by mouth daily.     Cholecalciferol (VITAMIN D) 125 MCG (5000 UT) CAPS Take 1 capsule by mouth daily.     Coenzyme Q10 (COQ10 PO) Take 1 capsule by mouth daily.     KRILL OIL PO Take 1 capsule by mouth daily.     losartan (COZAAR) 50 MG tablet Take 50 mg by mouth daily.     Magnesium Gluconate (MAGNESIUM 27 PO) Take 1 tablet by mouth every other day.      metolazone (ZAROXOLYN) 2.5 MG tablet Take 1 tablet (2.5 mg total) by mouth daily as needed (fluid retention). 20 tablet 0   midodrine (PROAMATINE) 10 MG tablet Take 1 tablet (10 mg total) by mouth 3 (three) times daily with meals. 90 tablet 11   OPSUMIT 10 MG tablet TAKE 1 TABLET DAILY 30 tablet 11   potassium chloride SA (KLOR-CON) 20 MEQ tablet Take 2 tablets (40 mEq total) by mouth 2 (two) times daily. 120 tablet 3   rosuvastatin (CRESTOR) 5 MG tablet TAKE 1 TABLET(5 MG) BY MOUTH DAILY 90 tablet 3   Selexipag (UPTRAVI) 400 MCG TABS Take 400 mcg by mouth in the morning and at bedtime. 60 tablet 6   spironolactone (ALDACTONE) 25 MG tablet TAKE 1/2 TABLET BY MOUTH EVERY DAY 45 tablet 3   torsemide (DEMADEX) 20 MG tablet Take 60 mg daily alternating with 40 mg daily 270 tablet 3   Vitamin D, Ergocalciferol, (DRISDOL) 1.25 MG (50000 UNIT) CAPS capsule TAKE 1 CAPSULE BY MOUTH EVERY 7 DAYS 8 capsule 0   warfarin (COUMADIN) 5 MG tablet TAKE  1/2 TO 1 TABLET DAILY AS DIRECTED BY COUMADIN CLINIC (Patient taking differently: Take 2.5 mg by mouth as directed. TAKE 1/2 TO 1 TABLET DAILY AS DIRECTED BY COUMADIN CLINIC) 30 tablet 5   No current facility-administered medications on file prior to visit.    BP 135/80   Pulse 80   Resp 18   Ht _0  (1.753 m)   Wt 147 lb 12.8 oz (67 kg)   SpO2 98%   BMI 21.83 kg/m        Objective:   Physical Exam   General Mental Status- Alert. General Appearance- Not in acute distress.   Skin General: Color- Normal Color. Moisture- Normal Moisture.  Neck Carotid Arteries- Normal color. Moisture- Normal Moisture. No carotid bruits. No JVD.  Chest and Lung Exam Auscultation: Breath Sounds:-Normal.  Cardiovascular Auscultation:Rythm- Regular. Murmurs & Other Heart Sounds:Auscultation of the heart reveals- No Murmurs.  Abdomen Inspection:-Inspeection Normal. Palpation/Percussion:Note:No mass. Palpation and Percussion of the abdomen reveal- Non Tender, Non Distended + BS, no rebound or guarding.   Neurologic Cranial Nerve exam:- CN III-XII intact(No nystagmus), symmetric smile. Strength:- 5/5 equal and symmetric strength both upper and lower extremities.    Lower extremity- no pedal edema. Negative homans sign.    Assessment & Plan:   [phone].  Recent abdomen discomfort/cramps with nausea with cramps.  Will get CBC CMP and lipase.   Some frequent urination recently.  Will get PSA, POCT UA and send urine out for culture.  Make sure you eat bland foods and hydrate adequately with propel fitness water or sugar-free Gatorade.  History of atrial fibrillation with some CHF in the past.  Some concern that diuretic use in combination with above might be contributing to dehydration.  We will get BNP and chest x-ray today as well as other labs to assess if any CHF present.  If no evidence of CHF presently then would recommend more aggressive hydration.  Recent intermittent cough with history of allergies and not taking allergy medications.  I will follow chest x-ray and also did COVID test in light of constellation of signs and symptoms.  Recent diffuse muscle aches/myalgias with above.  Request medication for pain so gave limited number of Norco 8 tablets.  Note cannot take NSAIDs since on Coumadin.  History of anxiety and stressed out recently.  In the  near future when you are out of the Norco could prescribe anxiety medication such as low-dose clonazepam but holding off presently.  Follow-up in 10 days or as needed.  Note labs are being done stat today.  I am not in office next week.  We will plan to call you and update you on lab results later today.  If you have worsening or changing signs symptoms of the weekend then recommend ED evaluation.  If not doing well over the next week to call and try to get in with other provider in our office.  [name], PA-C   Time spent with patient today was  40 minutes which consisted of chart revew, discussing diagnosis, work up treatment and documentation.

## 2021-06-17 NOTE — Addendum Note (Signed)
Addended by: Anabel Halon on: 06/17/2021 03:36 PM   Modules accepted: Orders

## 2021-06-17 NOTE — Addendum Note (Signed)
Addended by: Jeronimo Greaves on: 06/17/2021 04:02 PM   Modules accepted: Orders

## 2021-06-17 NOTE — Patient Instructions (Signed)
Signs and symptoms over the past week concerning for viral syndrome.  Will get COVID test today.  Follow results.  Recent abdomen discomfort/cramps with nausea with cramps.  Will get CBC CMP and lipase.   Some frequent urination recently.  Will get PSA, POCT UA and send urine out for culture.  Make sure you eat bland foods and hydrate adequately with propel fitness water or sugar-free Gatorade.  History of atrial fibrillation with some CHF in the past.  Some concern that diuretic use in combination with above might be contributing to dehydration.  We will get BNP and chest x-ray today as well as other labs to assess if any CHF present.  If no evidence of CHF presently then would recommend more aggressive hydration.  Recent intermittent cough with history of allergies and not taking allergy medications.  I will follow chest x-ray and also did COVID test in light of constellation of signs and symptoms.  Recent diffuse muscle aches/myalgias with above.  Request medication for pain so gave limited number of Norco 8 tablets.  Note cannot take NSAIDs since on Coumadin.  History of anxiety and stressed out recently.  In the near future when you are out of the Norco could prescribe anxiety medication such as low-dose clonazepam but holding off presently.  Follow-up in 10 days or as needed.  Note labs are being done stat today.  I am not in office next week.  We will plan to call you and update you on lab results later today.  If you have worsening or changing signs symptoms of the weekend then recommend ED evaluation.  If not doing well over the next week to call and try to get in with other provider in our office.

## 2021-06-17 NOTE — Telephone Encounter (Signed)
Zofran rx sent to pharmacy.

## 2021-06-17 NOTE — Addendum Note (Signed)
Addended by: Manuela Schwartz on: 06/17/2021 03:49 PM   Modules accepted: Orders

## 2021-06-17 NOTE — Telephone Encounter (Signed)
Patient state pharmacy did not received medication   HYDROcodone-acetaminophen ([place]) 5-325 MG tablet [[id number]]   [id number] - [place], [address] AT [place]  [address] [place] [address]  Phone:  [phone]  Fax:  [phone]

## 2021-06-17 NOTE — Addendum Note (Signed)
Addended by: Anabel Halon on: 06/17/2021 09:56 PM   Modules accepted: Orders

## 2021-06-18 LAB — COMPREHENSIVE METABOLIC PANEL
AG Ratio: 1.3 (calc) (ref 1.0–2.5)
ALT: 15 U/L (ref 9–46)
AST: 29 U/L (ref 10–35)
Albumin: 4.6 g/dL (ref 3.6–5.1)
Alkaline phosphatase (APISO): 50 U/L (ref 35–144)
BUN/Creatinine Ratio: 29 (calc) — ABNORMAL HIGH (ref 6–22)
BUN: 42 mg/dL — ABNORMAL HIGH (ref 7–25)
CO2: 23 mmol/L (ref 20–32)
Calcium: 10.1 mg/dL (ref 8.6–10.3)
Chloride: 91 mmol/L — ABNORMAL LOW (ref 98–110)
Creat: 1.44 mg/dL — ABNORMAL HIGH (ref 0.70–1.18)
Globulin: 3.6 g/dL (calc) (ref 1.9–3.7)
Glucose, Bld: 86 mg/dL (ref 65–99)
Potassium: 3.3 mmol/L — ABNORMAL LOW (ref 3.5–5.3)
Sodium: 129 mmol/L — ABNORMAL LOW (ref 135–146)
Total Bilirubin: 2.2 mg/dL — ABNORMAL HIGH (ref 0.2–1.2)
Total Protein: 8.2 g/dL — ABNORMAL HIGH (ref 6.1–8.1)

## 2021-06-18 LAB — CBC WITH DIFFERENTIAL/PLATELET
Absolute Monocytes: 891 cells/uL (ref 200–950)
Basophils Absolute: 33 cells/uL (ref 0–200)
Basophils Relative: 0.5 %
Eosinophils Absolute: 0 cells/uL — ABNORMAL LOW (ref 15–500)
Eosinophils Relative: 0 %
HCT: 38 % — ABNORMAL LOW (ref 38.5–50.0)
Hemoglobin: 12 g/dL — ABNORMAL LOW (ref 13.2–17.1)
Lymphs Abs: 416 cells/uL — ABNORMAL LOW (ref 850–3900)
MCH: 26.6 pg — ABNORMAL LOW (ref 27.0–33.0)
MCHC: 31.6 g/dL — ABNORMAL LOW (ref 32.0–36.0)
MCV: 84.3 fL (ref 80.0–100.0)
MPV: 10.3 fL (ref 7.5–12.5)
Monocytes Relative: 13.7 %
Neutro Abs: 5161 cells/uL (ref 1500–7800)
Neutrophils Relative %: 79.4 %
Platelets: 206 10*3/uL (ref 140–400)
RBC: 4.51 10*6/uL (ref 4.20–5.80)
RDW: 13.6 % (ref 11.0–15.0)
Total Lymphocyte: 6.4 %
WBC: 6.5 10*3/uL (ref 3.8–10.8)

## 2021-06-18 LAB — URINE CULTURE
MICRO NUMBER:: 12047784
Result:: NO GROWTH
SPECIMEN QUALITY:: ADEQUATE

## 2021-06-18 LAB — NOVEL CORONAVIRUS, NAA: SARS-CoV-2, NAA: NOT DETECTED

## 2021-06-18 LAB — SARS-COV-2, NAA 2 DAY TAT

## 2021-06-18 LAB — PSA: PSA: 0.14 ng/mL (ref <=4.00)

## 2021-06-18 LAB — LIPASE: Lipase: 73 U/L — ABNORMAL HIGH (ref 7–60)

## 2021-06-18 LAB — CK: Total CK: 114 U/L (ref 44–196)

## 2021-06-19 DIAGNOSIS — Z951 Presence of aortocoronary bypass graft: Secondary | ICD-10-CM | POA: Diagnosis not present

## 2021-06-19 DIAGNOSIS — I110 Hypertensive heart disease with heart failure: Secondary | ICD-10-CM | POA: Diagnosis not present

## 2021-06-19 DIAGNOSIS — R942 Abnormal results of pulmonary function studies: Secondary | ICD-10-CM | POA: Diagnosis not present

## 2021-06-19 DIAGNOSIS — Z20822 Contact with and (suspected) exposure to covid-19: Secondary | ICD-10-CM | POA: Diagnosis not present

## 2021-06-19 DIAGNOSIS — Z9109 Other allergy status, other than to drugs and biological substances: Secondary | ICD-10-CM | POA: Diagnosis not present

## 2021-06-19 DIAGNOSIS — R778 Other specified abnormalities of plasma proteins: Secondary | ICD-10-CM | POA: Diagnosis not present

## 2021-06-19 DIAGNOSIS — E860 Dehydration: Secondary | ICD-10-CM | POA: Diagnosis not present

## 2021-06-19 DIAGNOSIS — K7460 Unspecified cirrhosis of liver: Secondary | ICD-10-CM | POA: Diagnosis not present

## 2021-06-19 DIAGNOSIS — Z7189 Other specified counseling: Secondary | ICD-10-CM | POA: Diagnosis not present

## 2021-06-19 DIAGNOSIS — J432 Centrilobular emphysema: Secondary | ICD-10-CM | POA: Diagnosis not present

## 2021-06-19 DIAGNOSIS — N179 Acute kidney failure, unspecified: Secondary | ICD-10-CM | POA: Diagnosis not present

## 2021-06-19 DIAGNOSIS — Z8616 Personal history of COVID-19: Secondary | ICD-10-CM | POA: Diagnosis not present

## 2021-06-19 DIAGNOSIS — R7401 Elevation of levels of liver transaminase levels: Secondary | ICD-10-CM | POA: Diagnosis not present

## 2021-06-19 DIAGNOSIS — E7849 Other hyperlipidemia: Secondary | ICD-10-CM | POA: Diagnosis not present

## 2021-06-19 DIAGNOSIS — Z5181 Encounter for therapeutic drug level monitoring: Secondary | ICD-10-CM | POA: Diagnosis not present

## 2021-06-19 DIAGNOSIS — D7389 Other diseases of spleen: Secondary | ICD-10-CM | POA: Diagnosis not present

## 2021-06-19 DIAGNOSIS — I482 Chronic atrial fibrillation, unspecified: Secondary | ICD-10-CM | POA: Diagnosis not present

## 2021-06-19 DIAGNOSIS — Z7982 Long term (current) use of aspirin: Secondary | ICD-10-CM | POA: Diagnosis not present

## 2021-06-19 DIAGNOSIS — E861 Hypovolemia: Secondary | ICD-10-CM | POA: Diagnosis not present

## 2021-06-19 DIAGNOSIS — K5730 Diverticulosis of large intestine without perforation or abscess without bleeding: Secondary | ICD-10-CM | POA: Diagnosis not present

## 2021-06-19 DIAGNOSIS — W19XXXA Unspecified fall, initial encounter: Secondary | ICD-10-CM | POA: Diagnosis not present

## 2021-06-19 DIAGNOSIS — J811 Chronic pulmonary edema: Secondary | ICD-10-CM | POA: Diagnosis not present

## 2021-06-19 DIAGNOSIS — I509 Heart failure, unspecified: Secondary | ICD-10-CM | POA: Diagnosis not present

## 2021-06-19 DIAGNOSIS — R590 Localized enlarged lymph nodes: Secondary | ICD-10-CM | POA: Diagnosis not present

## 2021-06-19 DIAGNOSIS — Z9049 Acquired absence of other specified parts of digestive tract: Secondary | ICD-10-CM | POA: Diagnosis not present

## 2021-06-19 DIAGNOSIS — I10 Essential (primary) hypertension: Secondary | ICD-10-CM | POA: Diagnosis not present

## 2021-06-19 DIAGNOSIS — I2729 Other secondary pulmonary hypertension: Secondary | ICD-10-CM | POA: Diagnosis not present

## 2021-06-19 DIAGNOSIS — I272 Pulmonary hypertension, unspecified: Secondary | ICD-10-CM | POA: Diagnosis not present

## 2021-06-19 DIAGNOSIS — R509 Fever, unspecified: Secondary | ICD-10-CM | POA: Diagnosis not present

## 2021-06-19 DIAGNOSIS — W07XXXA Fall from chair, initial encounter: Secondary | ICD-10-CM | POA: Diagnosis not present

## 2021-06-19 DIAGNOSIS — I4891 Unspecified atrial fibrillation: Secondary | ICD-10-CM | POA: Diagnosis not present

## 2021-06-19 DIAGNOSIS — Z7901 Long term (current) use of anticoagulants: Secondary | ICD-10-CM | POA: Diagnosis not present

## 2021-06-19 DIAGNOSIS — I871 Compression of vein: Secondary | ICD-10-CM | POA: Diagnosis not present

## 2021-06-19 DIAGNOSIS — R079 Chest pain, unspecified: Secondary | ICD-10-CM | POA: Diagnosis not present

## 2021-06-19 DIAGNOSIS — K769 Liver disease, unspecified: Secondary | ICD-10-CM | POA: Diagnosis not present

## 2021-06-19 DIAGNOSIS — Z954 Presence of other heart-valve replacement: Secondary | ICD-10-CM | POA: Diagnosis not present

## 2021-06-19 DIAGNOSIS — I25119 Atherosclerotic heart disease of native coronary artery with unspecified angina pectoris: Secondary | ICD-10-CM | POA: Diagnosis not present

## 2021-06-19 DIAGNOSIS — M25552 Pain in left hip: Secondary | ICD-10-CM | POA: Diagnosis not present

## 2021-06-19 DIAGNOSIS — R918 Other nonspecific abnormal finding of lung field: Secondary | ICD-10-CM | POA: Diagnosis not present

## 2021-06-19 DIAGNOSIS — I2510 Atherosclerotic heart disease of native coronary artery without angina pectoris: Secondary | ICD-10-CM | POA: Diagnosis not present

## 2021-06-19 DIAGNOSIS — I4892 Unspecified atrial flutter: Secondary | ICD-10-CM | POA: Diagnosis not present

## 2021-06-19 DIAGNOSIS — Z87891 Personal history of nicotine dependence: Secondary | ICD-10-CM | POA: Diagnosis not present

## 2021-06-19 DIAGNOSIS — J449 Chronic obstructive pulmonary disease, unspecified: Secondary | ICD-10-CM | POA: Diagnosis not present

## 2021-06-19 DIAGNOSIS — Z043 Encounter for examination and observation following other accident: Secondary | ICD-10-CM | POA: Diagnosis not present

## 2021-06-19 DIAGNOSIS — Z515 Encounter for palliative care: Secondary | ICD-10-CM | POA: Diagnosis not present

## 2021-06-19 DIAGNOSIS — R932 Abnormal findings on diagnostic imaging of liver and biliary tract: Secondary | ICD-10-CM | POA: Diagnosis not present

## 2021-06-19 DIAGNOSIS — K5900 Constipation, unspecified: Secondary | ICD-10-CM | POA: Diagnosis not present

## 2021-06-19 DIAGNOSIS — Z79899 Other long term (current) drug therapy: Secondary | ICD-10-CM | POA: Diagnosis not present

## 2021-06-19 DIAGNOSIS — R160 Hepatomegaly, not elsewhere classified: Secondary | ICD-10-CM | POA: Diagnosis not present

## 2021-06-19 DIAGNOSIS — D509 Iron deficiency anemia, unspecified: Secondary | ICD-10-CM | POA: Diagnosis not present

## 2021-06-19 DIAGNOSIS — R55 Syncope and collapse: Secondary | ICD-10-CM | POA: Diagnosis not present

## 2021-06-19 DIAGNOSIS — J8410 Pulmonary fibrosis, unspecified: Secondary | ICD-10-CM | POA: Diagnosis not present

## 2021-06-19 DIAGNOSIS — I455 Other specified heart block: Secondary | ICD-10-CM | POA: Diagnosis not present

## 2021-06-19 DIAGNOSIS — E871 Hypo-osmolality and hyponatremia: Secondary | ICD-10-CM | POA: Diagnosis not present

## 2021-06-19 DIAGNOSIS — R7989 Other specified abnormal findings of blood chemistry: Secondary | ICD-10-CM | POA: Diagnosis not present

## 2021-06-19 DIAGNOSIS — J9610 Chronic respiratory failure, unspecified whether with hypoxia or hypercapnia: Secondary | ICD-10-CM | POA: Diagnosis not present

## 2021-06-21 ENCOUNTER — Ambulatory Visit: Payer: Medicare Other | Admitting: Emergency Medicine

## 2021-06-21 DIAGNOSIS — W19XXXA Unspecified fall, initial encounter: Secondary | ICD-10-CM | POA: Insufficient documentation

## 2021-06-21 NOTE — Telephone Encounter (Signed)
Pt called to verify but lvm to return call

## 2021-06-22 NOTE — Telephone Encounter (Signed)
Called pt and lvm to return call.  

## 2021-06-23 ENCOUNTER — Other Ambulatory Visit (HOSPITAL_COMMUNITY): Payer: Self-pay | Admitting: Internal Medicine

## 2021-06-23 ENCOUNTER — Telehealth: Payer: Self-pay | Admitting: Medical

## 2021-06-23 NOTE — Telephone Encounter (Signed)
Attempted to schedule AWV. Unable to LVM.  Will try at later time.  Mail box full

## 2021-06-30 ENCOUNTER — Telehealth (HOSPITAL_COMMUNITY): Payer: Self-pay | Admitting: Internal Medicine

## 2021-06-30 NOTE — Telephone Encounter (Signed)
Pt stated he left message on triage line yesterday regarding fluid built up around heart, he was advised to get an urgent appt with DB from physician _0 , pt can be reached _[phone], please advise

## 2021-06-30 NOTE — Telephone Encounter (Signed)
Patient reports he was hospitalized for RMSF and during hospitalization treated for dehydration. Since discharge has experienced BLE. Unable to get his legs to go down. Took metolazone x 1 with no relief Torsemide currently ordered at 60 alt 40 daily Reports he only took 30 mg today with concerns regarding dehydration.    Above reviewed with [facility] notes reviewed [facility] for patient to resume normal dose of torsemide BUN/Cr stabilized at discharge however should return for labs in 7-10 days. Follow up in 2-3 weeks.    Pt aware and voiced understanding   Pt states he also needs to re-enroll for the grants for his specialty medications-uptravi, opsumit,  -will forward to pharmacy team to assist with re-enrollment

## 2021-07-01 ENCOUNTER — Inpatient Hospital Stay: Payer: Medicare Other | Admitting: Family

## 2021-07-04 ENCOUNTER — Ambulatory Visit (INDEPENDENT_AMBULATORY_CARE_PROVIDER_SITE_OTHER): Payer: Medicare Other | Admitting: Medical

## 2021-07-04 ENCOUNTER — Other Ambulatory Visit: Payer: Self-pay

## 2021-07-04 ENCOUNTER — Ambulatory Visit (HOSPITAL_BASED_OUTPATIENT_CLINIC_OR_DEPARTMENT_OTHER)
Admission: RE | Admit: 2021-07-04 | Discharge: 2021-07-04 | Disposition: A | Discharge status: Home / Self Care | Payer: Medicare Other | Source: Ambulatory Visit | Attending: Medical | Admitting: Medical

## 2021-07-04 VITALS — BP 140/70 | HR 51 | Temp 98.2°F | Resp 18 | Ht 69.0 in | Wt 135.0 lb

## 2021-07-04 DIAGNOSIS — E871 Hypo-osmolality and hyponatremia: Secondary | ICD-10-CM | POA: Diagnosis not present

## 2021-07-04 DIAGNOSIS — I509 Heart failure, unspecified: Secondary | ICD-10-CM | POA: Diagnosis not present

## 2021-07-04 DIAGNOSIS — R748 Abnormal levels of other serum enzymes: Secondary | ICD-10-CM

## 2021-07-04 DIAGNOSIS — A770 Spotted fever due to Rickettsia rickettsii: Secondary | ICD-10-CM

## 2021-07-04 DIAGNOSIS — I517 Cardiomegaly: Secondary | ICD-10-CM | POA: Diagnosis not present

## 2021-07-04 DIAGNOSIS — R0600 Dyspnea, unspecified: Secondary | ICD-10-CM | POA: Diagnosis not present

## 2021-07-04 DIAGNOSIS — I25119 Atherosclerotic heart disease of native coronary artery with unspecified angina pectoris: Secondary | ICD-10-CM

## 2021-07-04 DIAGNOSIS — I4891 Unspecified atrial fibrillation: Secondary | ICD-10-CM | POA: Diagnosis not present

## 2021-07-04 DIAGNOSIS — K7460 Unspecified cirrhosis of liver: Secondary | ICD-10-CM

## 2021-07-04 MED ORDER — DOXYCYCLINE HYCLATE 100 MG PO TABS: 100.0000 mg | ORAL_TABLET | Freq: Two times a day (BID) | ORAL | 0 refills | Status: DC

## 2021-07-04 NOTE — Patient Instructions (Addendum)
History of atrial fibrillation, CHF and baseline chronic dyspnea.  Rate controlled today.  No lower extremity pedal edema.  Do want to get chest x-ray, BNP and CMP today.  Make sure electrolytes are in range.  Particularly since he did have hyponatremia at the time of your hospitalization.  Reviewed [facility] hospitalization notes sent to me today and reviewed care everywhere as well.  Clinically stable presently.  Left hip pain recently from fall.  Review of x-ray showed no fracture.  Glad to hear that you are able to ambulate on treadmill without any pain today.  I recommend still be cautious and do not overdo it presently.  Elevated LFTs and will follow liver enzymes today.  History of pulmonary hypertension.  Looks like you are being reenrolled in [place] and opsumit program.  History of RMSF and received IV doxycycline as well as 10 days of doxycycline.  We will give additional 5 days of doxycycline and refer you to ID at home.  Recommend that you get your INR repeated with cardiologis on follow-up.  Follow-up with me in 3 to 4 weeks or as needed.

## 2021-07-04 NOTE — Progress Notes (Signed)
Subjective:    Patient ID: Lance Hicks, male    DOB: 1946/11/06, 75 y.o.   MRN: [id number]  HPI   Pt in for follow up from the hospital. Pt states his daughter took him to [facility].   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  Pt tells me he flipped out of chair and landed on hip. No loc. Daughter took him to ED or this reason.  He had extensive work up since.   Pt wears shorts and outside all the time.   Pt has hx of chf and pulmonary htn.  Pt called his cardiologist who recommended get back on diuretics. But advised hold off on metalozone. Restarted diuretics last wed. Today told to stop metalozone.   Pt is on torsemide 2 tab one day. 3 tabs a day.   He is on midrodine daily for low blood pressure.  Pt has treadmill at his house and he walked for about 10 minutes today.  Pt last inr 2.4.Pt will get INR with cardiologist.     Review of Systems  Constitutional:  Negative for chills, fatigue and fever.  HENT:  Negative for congestion, drooling and facial swelling.   Respiratory:  Negative for cough, choking, chest tightness and wheezing.   Cardiovascular:  Negative for chest pain and palpitations.  Gastrointestinal:  Negative for abdominal pain, blood in stool, constipation, diarrhea and nausea.  Genitourinary:  Negative for dysuria, flank pain and frequency.  Musculoskeletal:  Negative for back pain and myalgias.       Left hip pain after fall. No fracture per chart review.  Skin:  Negative for rash.    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 In light of concurrent left nephrolithiasis, will check CT renal colic next year instead of renal US.    Anticoagulated on Coumadin 01/04/2018   Anxiety 05/10/2016   Last Assessment & Plan:  Doing well off of zoloft.    Asthma    Asymptomatic microscopic hematuria 06/20/2017   Last Assessment & Plan:  Had hematuria workup in [facility] in 2016 which negative CT and cystoscopy. UA with 2+ blood last visit - we discussed recommendation for repeat workup at 5 years or if degree of hematuria progresses.    Atrial fibrillation ([place]) 03/29/2017   Atrial flutter ([place]) 03/29/2017   Backache 12/14/2015   Last Assessment & Plan:  Pain management referral for further evaluation.   Bilateral pleural effusion 08/09/2017   CHF (congestive heart failure) (HCC)    Chronic allergic rhinitis 04/28/2016   Last Assessment & Plan:  Continue astelin   Chronic anticoagulation 03/29/2017   Chronic atrial fibrillation ([place]) 07/23/2015   Last Assessment & Plan:  Coumadin and metoprolol, cardiology referral to establish care.   Chronic midline back pain 12/14/2015   Last Assessment & Plan:  Pain management referral for further evaluation.   Chronic obstructive lung disease ([place]) 06/12/2016   With hypoxia   Chronic prostatitis 07/23/2015   Last Assessment & Plan:  Has largely resolved since stopping bike riding. Recommend annual DRE AND PSA - will see back 12/2015 for annual screening, given 1st degree fhx. To call office for recurrent prostatitis symptoms.    Chronic respiratory failure with hypoxia ([place]) 07/29/2019   Cirrhosis of liver  not due to alcohol ([place]) 07/01/2019   COPD (chronic obstructive pulmonary disease) ([place]) 06/12/2016   Coronary arteriosclerosis in native artery 03/29/2017   Coronary artery disease involving native coronary artery of native heart with angina pectoris ([place]) 03/29/2017   Cough 10/17/2016   Last Assessment & Plan:  Discussed typical course for acute viral illness. If symptoms worsen or fail to improve by 7-10d, delayed ATBs, fluids, rest, NSAIDs/APAP prn. Seek care if not improving. Needs earlier INR check due to ATBs.   Dyspnea 02/01/2016   Last Assessment & Plan:  Overall improving, eval by pulm, plan for CT, neg stress test with  cardiology. Recent switch to carvedilol due to side effects.   Encounter for therapeutic drug monitoring 01/06/2019   Enterococcal bacteremia    Epidermoid cyst of skin 08/24/2017   Essential hypertension 12/14/2015   Last Assessment & Plan:  Hypertension control: controlled  Medications: compliant Medication Management: as noted in orders Home blood pressure monitoring recommended additionally as needed for symptoms  The patient's care plan was reviewed and updated. Instructions and counseling were provided regarding patient goals and barriers. He was counseled to adopt a healthy lifestyle. Educational resources and self-management tools have been provided as charted in [facility] list.    H/O maze procedure 03/29/2017   [name] 03/29/2017   Overview:  2011   History of coronary artery bypass graft 03/29/2017   Hx of CABG 03/29/2017   Hyperlipidemia 03/29/2017   Hypersensitivity angiitis ([place]) 10/01/2017   Hypertensive heart disease 03/29/2017   Hypertensive heart disease with heart failure ([place]) 03/29/2017   Hypertensive heart failure ([place]) 03/29/2017   Hypokalemia due to excessive renal loss of potassium 02/18/2018   Hypotension, chronic 06/06/2018   International normalized ratio (INR) raised 07/26/2017   Kidney stone 07/23/2015   Kidney stones 07/23/2015   Overview:  x 3  Last Assessment & Plan:  By [place] has left nephrolithiasis, but not visible by KUB. Will check CT renal colic next year to assess both stone burden as well as to surveil AML.    Left ureteral stone 01/23/2018   Leukocytoclastic vasculitis ([place]) 10/01/2017   Localized edema 01/04/2018   Long term (current) use of anticoagulants 03/29/2017   Lumbar radicular pain 01/19/2016   Lumbar radiculopathy 01/19/2016   Maculopapular rash 09/03/2017   Microscopic hematuria 06/20/2017   Last Assessment & Plan:  Had hematuria workup in [facility] in 2016 which negative CT and cystoscopy. UA with 2+ blood last visit - we discussed recommendation for  repeat workup at 5 years or if degree of hematuria progresses.    Multiple nodules of lung 06/12/2016   Nasal discharge 02/25/2016   Last Assessment & Plan:  Trial zyrtec and flonase   Nephrolithiasis 07/23/2015   Overview:  x 3  Last Assessment & Plan:  By [place] has left nephrolithiasis, but not visible by KUB. Will check CT renal colic next year to assess both stone burden as well as to surveil AML.   Overview:  x 3  Last Assessment & Plan:  Has 18m nonobstructing LUP stone - not visible by KUB.  Will check renal [place]2019 - he will contact office sooner if symptomatic.    Non-sustained ventricular tachycardia ([place] 03/29/2017   Nonsustained ventricular tachycardia ([place] 03/29/2017   Other hyperlipidemia 03/29/2017   Palpitations 10/01/2017   Pleural effusion, bilateral 08/09/2017   Pneumonia due to COVID-19 virus 02/07/2021   Post-nasal drainage 02/25/2016   Last Assessment & Plan:  Trial zyrtec  and flonase   Prostate cancer screening 06/20/2017   Last Assessment & Plan:  Recommend continued annual CaP screening until within 10 years of life expectancy. Given good health and fhx of longevity, would anticipate CaP screening to continue until age 8.  PSA today and again in one year on day of visit.   Pulmonary arterial hypertension ([place]) 02/20/2018   Pulmonary edema    Pulmonary hypertension ([place]) 08/09/2017   Pulmonary nodules 06/12/2016   S/P AVR 03/20/2016   S/P AVR (aortic valve replacement) 03/20/2016   Strain of deltoid muscle, initial encounter    Supratherapeutic INR 07/26/2017   Syncope and collapse 02/01/2016   Typical atrial flutter ([place]) 02/01/2016     Social History   Socioeconomic History   Marital status: Single    Spouse name: Not on file   Number of children: Not on file   Years of education: Not on file   Highest education level: Not on file  Occupational History   Not on file  Tobacco Use   Smoking status: Former    Packs/day: 2.00    Years: 34.00    Pack years: 68.00    Types:  Cigarettes    Quit date: 07/16/2000    Years since quitting: 20.9   Smokeless tobacco: Never  Vaping Use   Vaping Use: Never used  Substance and Sexual Activity   Alcohol use: Not Currently   Drug use: No   Sexual activity: Not on file  Other Topics Concern   Not on file  Social History Narrative   Not on file   Social Determinants of Health   Financial Resource Strain: Not on file  Food Insecurity: Not on file  Transportation Needs: Not on file  Physical Activity: Not on file  Stress: Not on file  Social Connections: Not on file  Intimate Partner Violence: Not on file    Past Surgical History:  Procedure Laterality Date   CHOLECYSTECTOMY     CORONARY ARTERY BYPASS GRAFT     EXPLORATORY LAPAROTOMY  07/30/2017   FOOT SURGERY     FRACTURE SURGERY Right    wrist and forearm   HERNIA REPAIR     MECHANICAL AORTIC VALVE REPLACEMENT     NASAL SINUS SURGERY     RIGHT HEART CATH N/A 01/18/2018   Procedure: RIGHT HEART CATH;  Surgeon: Jolaine Artist, MD;  Location: [facility];  Service: Cardiovascular;  Laterality: N/A;   RIGHT HEART CATH N/A 05/14/2018   Procedure: RIGHT HEART CATH;  Surgeon: Jolaine Artist, MD;  Location: [facility];  Service: Cardiovascular;  Laterality: N/A;   TEE WITHOUT CARDIOVERSION N/A 05/21/2018   Procedure: TRANSESOPHAGEAL ECHOCARDIOGRAM (TEE);  Surgeon: Jolaine Artist, MD;  Location: [facility] ENDOSCOPY;  Service: Cardiovascular;  Laterality: N/A;   UPPER GASTROINTESTINAL ENDOSCOPY  07/12/2017   Patchy areas of mucosal inflammation noted in the antrum with edema,erthema and ulcerations. Bx. Chronicfocally active gastritis.   VASECTOMY      Family History  Problem Relation Age of Onset   Asthma Mother    Arthritis Mother    Heart attack Father    Hypertension Father    Stroke Paternal Grandmother    Colon cancer Neg Hx     Allergies  Allergen Reactions   Amoxicillin-Pot Clavulanate Diarrhea and Anaphylaxis    Stomach  pain Has patient had a PCN reaction causing immediate rash, facial/tongue/throat swelling, SOB or lightheadedness with hypotension: No Has patient had a PCN reaction causing severe  rash involving mucus membranes or skin necrosis: No Has patient had a PCN reaction that required hospitalization: No Has patient had a PCN reaction occurring within the last 10 years: Yes If all of the above answers are "NO", then may proceed with Cephalosporin use.  Stomach pain Has patient had a PCN reaction causing immediate rash, facial/tongue/throat swelling, SOB or lightheadedness with hypotension: No Has patient had a PCN reaction causing severe rash involving mucus membranes or skin necrosis: No Has patient had a PCN reaction that required hospitalization: No Has patient had a PCN reaction occurring within the last 10 years: Yes If all of the above answers are "NO", then may proceed with Cephalosporin use. n/v   Pantoprazole Sodium Nausea Only    Gets gassy and starting itching like crazy Gets gassy and starting itching like crazy   Tape Rash and Other (See Comments)    Surgical tape   Wound Dressing Adhesive Other (See Comments) and Rash    Surgical tape Surgical tape Surgical tape    Current Outpatient Medications on File Prior to Visit  Medication Sig Dispense Refill   albuterol (VENTOLIN HFA) 108 (90 Base) MCG/ACT inhaler Inhale 2 puffs into the lungs every 6 (six) hours as needed for wheezing or shortness of breath. 8 g 6   aspirin EC 81 MG tablet Take 81 mg by mouth daily.     B Complex-C (B-COMPLEX WITH VITAMIN C) tablet Take 1 tablet by mouth daily. Unknown strength     cetirizine (ZYRTEC) 10 MG tablet Take 10 mg by mouth daily.     Cholecalciferol (VITAMIN D) 125 MCG (5000 UT) CAPS Take 1 capsule by mouth daily.     Coenzyme Q10 (COQ10 PO) Take 1 capsule by mouth daily.     HYDROcodone-acetaminophen (NORCO) 5-325 MG tablet Take 1 tablet by mouth every 6 (six) hours as needed for moderate  pain. 8 tablet 0   KRILL OIL PO Take 1 capsule by mouth daily.     losartan (COZAAR) 50 MG tablet Take 50 mg by mouth daily.     Magnesium Gluconate (MAGNESIUM 27 PO) Take 1 tablet by mouth every other day.      metolazone (ZAROXOLYN) 2.5 MG tablet TAKE 1 TABLET(2.5 MG) BY MOUTH DAILY AS NEEDED FOR FLUID RETENTION 20 tablet 0   midodrine (PROAMATINE) 10 MG tablet Take 1 tablet (10 mg total) by mouth 3 (three) times daily with meals. 90 tablet 11   ondansetron (ZOFRAN ODT) 4 MG disintegrating tablet Take 1 tablet (4 mg total) by mouth every 8 (eight) hours as needed for nausea or vomiting. 20 tablet 0   OPSUMIT 10 MG tablet TAKE 1 TABLET DAILY 30 tablet 11   potassium chloride SA (KLOR-CON) 20 MEQ tablet Take 2 tablets (40 mEq total) by mouth 2 (two) times daily. 120 tablet 3   rosuvastatin (CRESTOR) 5 MG tablet TAKE 1 TABLET(5 MG) BY MOUTH DAILY 90 tablet 3   Selexipag (UPTRAVI) 400 MCG TABS Take 400 mcg by mouth in the morning and at bedtime. 60 tablet 6   spironolactone (ALDACTONE) 25 MG tablet TAKE 1/2 TABLET BY MOUTH EVERY DAY 45 tablet 3   torsemide (DEMADEX) 20 MG tablet Take 60 mg daily alternating with 40 mg daily 270 tablet 3   Vitamin D, Ergocalciferol, (DRISDOL) 1.25 MG (50000 UNIT) CAPS capsule TAKE 1 CAPSULE BY MOUTH EVERY 7 DAYS 8 capsule 0   warfarin (COUMADIN) 5 MG tablet TAKE 1/2 TO 1 TABLET DAILY AS DIRECTED BY COUMADIN  CLINIC (Patient taking differently: Take 2.5 mg by mouth as directed. TAKE 1/2 TO 1 TABLET DAILY AS DIRECTED BY COUMADIN CLINIC) 30 tablet 5   No current facility-administered medications on file prior to visit.    BP 140/70   Pulse (!) 51   Temp 98.2 F (36.8 C)   Resp 18   Ht _0  (1.753 m)   Wt 135 lb (61.2 kg)   SpO2 94%   BMI 19.94 kg/m       Objective:   Physical Exam  General Mental Status- Alert. General Appearance- Not in acute distress.   Skin General: Color- Normal Color. Moisture- Normal Moisture.  Neck Carotid Arteries- Normal  color. Moisture- Normal Moisture. No carotid bruits. No JVD.  Chest and Lung Exam Auscultation: Breath Sounds:-Normal.  Cardiovascular Auscultation:Rythm- Regular. Murmurs & Other Heart Sounds:Auscultation of the heart reveals- No Murmurs.  Abdomen Inspection:-Inspeection Normal. Palpation/Percussion:Note:No mass. Palpation and Percussion of the abdomen reveal- Non Tender, Non Distended + BS, no rebound or guarding.  Neurologic Cranial Nerve exam:- CN III-XII intact(No nystagmus), symmetric smile. Strength:- 5/5 equal and symmetric strength both upper and lower extremities.       Assessment & Plan:   History of atrial fibrillation, CHF and baseline chronic dyspnea.  Rate controlled today.  No lower extremity pedal edema.  Do want to get chest x-ray, BNP and CMP today.  Make sure electrolytes are in range.  Particularly since he did have hyponatremia at the time of your hospitalization.  Reviewed [facility] hospitalization notes sent to me today and reviewed care everywhere as well.  Clinically stable presently.  Left hip pain recently from fall.  Review of x-ray showed no fracture.  Glad to hear that you are able to ambulate on treadmill without any pain today.  I recommend still be cautious and do not overdo it presently.  Elevated LFTs and will follow liver enzymes today.  History of pulmonary hypertension.  Looks like you are being reenrolled in [place] and opsumit program.  History of RMSF and received IV doxycycline as well as 10 days of doxycycline.  We will give additional 5 days of doxycycline and refer you to ID at home.  Recommend that you get your INR repeated with cardiologis on follow-up.  Follow-up with me in 3 to 4 weeks or as needed.  Time spent with patient today was 45  minutes which consisted of chart revdiew, discussing diagnosis, work up treatment and documentation.

## 2021-07-05 ENCOUNTER — Telehealth (HOSPITAL_COMMUNITY): Payer: Self-pay | Admitting: Pharmacy Technician

## 2021-07-05 ENCOUNTER — Other Ambulatory Visit (HOSPITAL_COMMUNITY): Payer: Self-pay | Admitting: Unknown Physician Specialty

## 2021-07-05 LAB — COMPREHENSIVE METABOLIC PANEL
ALT: 24 U/L (ref 0–53)
AST: 33 U/L (ref 0–37)
Albumin: 4.3 g/dL (ref 3.5–5.2)
Alkaline Phosphatase: 83 U/L (ref 39–117)
BUN: 33 mg/dL — ABNORMAL HIGH (ref 6–23)
CO2: 28 mEq/L (ref 19–32)
Calcium: 10.2 mg/dL (ref 8.4–10.5)
Chloride: 95 mEq/L — ABNORMAL LOW (ref 96–112)
Creatinine, Ser: 1.26 mg/dL (ref 0.40–1.50)
GFR: 56.28 mL/min — ABNORMAL LOW (ref >60.00)
Glucose, Bld: 88 mg/dL (ref 70–99)
Potassium: 4.5 mEq/L (ref 3.5–5.1)
Sodium: 133 mEq/L — ABNORMAL LOW (ref 135–145)
Total Bilirubin: 1.9 mg/dL — ABNORMAL HIGH (ref 0.2–1.2)
Total Protein: 8.6 g/dL — ABNORMAL HIGH (ref 6.0–8.3)

## 2021-07-05 LAB — BRAIN NATRIURETIC PEPTIDE: Pro B Natriuretic peptide (BNP): 425 pg/mL — ABNORMAL HIGH (ref 0.0–100.0)

## 2021-07-05 MED ORDER — POTASSIUM CHLORIDE CRYS ER 20 MEQ PO TBCR: 40.0000 meq | EXTENDED_RELEASE_TABLET | Freq: Two times a day (BID) | ORAL | 6 refills | Status: DC

## 2021-07-05 NOTE — Telephone Encounter (Signed)
Advanced Heart Failure Patient Advocate Encounter  I received a message from the patient that his [place] was depleted that was covering Opsumit, Uptravi and Tadalafil.  His grant is active through November with $255 left. He will continue to fill Tadalafil with the current grant.  [employer] faxed over an assistance application for Uptravi and Opsumit. The patient stated that he would fax in his portion to [employer] directly. Will fax in the providers portion. Patient is aware to include POI to his application.

## 2021-07-07 ENCOUNTER — Ambulatory Visit: Payer: Medicare Other | Admitting: Emergency Medicine

## 2021-07-08 NOTE — Telephone Encounter (Signed)
Sent in provider's portion of assistance application via fax.  Will follow up.

## 2021-07-11 ENCOUNTER — Other Ambulatory Visit: Payer: Self-pay

## 2021-07-11 ENCOUNTER — Ambulatory Visit (HOSPITAL_COMMUNITY)
Admission: RE | Admit: 2021-07-11 | Discharge: 2021-07-11 | Disposition: A | Discharge status: Home / Self Care | Payer: Medicare Other | Source: Ambulatory Visit | Attending: Internal Medicine | Admitting: Internal Medicine

## 2021-07-11 DIAGNOSIS — E876 Hypokalemia: Secondary | ICD-10-CM | POA: Diagnosis not present

## 2021-07-11 LAB — BASIC METABOLIC PANEL
Anion gap: 8 (ref 5–15)
BUN: 46 mg/dL — ABNORMAL HIGH (ref 8–23)
CO2: 24 mmol/L (ref 22–32)
Calcium: 9.7 mg/dL (ref 8.9–10.3)
Chloride: 100 mmol/L (ref 98–111)
Creatinine, Ser: 1.17 mg/dL (ref 0.61–1.24)
GFR, Estimated: >60 mL/min (ref >60)
Glucose, Bld: 99 mg/dL (ref 70–99)
Potassium: 4.3 mmol/L (ref 3.5–5.1)
Sodium: 132 mmol/L — ABNORMAL LOW (ref 135–145)

## 2021-07-12 ENCOUNTER — Ambulatory Visit (INDEPENDENT_AMBULATORY_CARE_PROVIDER_SITE_OTHER): Payer: Medicare Other

## 2021-07-12 DIAGNOSIS — I4892 Unspecified atrial flutter: Secondary | ICD-10-CM | POA: Diagnosis not present

## 2021-07-12 DIAGNOSIS — Z7901 Long term (current) use of anticoagulants: Secondary | ICD-10-CM | POA: Diagnosis not present

## 2021-07-12 LAB — POCT INR: INR: 3 (ref 2.0–3.0)

## 2021-07-12 NOTE — Patient Instructions (Signed)
Continue taking 1/2 tablet daily except 1 tablet on Monday, Wednesday and Friday..  Please call our office with any medication changes or concerns [phone]. Return in 6 weeks for INR check.

## 2021-07-13 NOTE — Telephone Encounter (Signed)
Advanced Heart Failure Patient Advocate Encounter  Patient was approved to receive Uptravi and Opsumit from [employer].  Patient ID: [id number] Effective dates: 07/12/21 through 09/10/21  Spoke with patient regarding temporary approval. [employer] will send a Medicare attestation form that requires his signature in order to obtain approval through the remainder of the year. Stated he will fax it back to the company once received. Provided phone number to [employer], [phone].  Charlann Boxer, CPhT

## 2021-07-14 ENCOUNTER — Other Ambulatory Visit (HOSPITAL_COMMUNITY): Payer: Self-pay | Admitting: Unknown Physician Specialty

## 2021-07-14 MED ORDER — UPTRAVI 400 MCG PO TABS: 400.0000 ug | ORAL_TABLET | Freq: Two times a day (BID) | ORAL | 6 refills | Status: DC

## 2021-07-18 ENCOUNTER — Other Ambulatory Visit: Payer: Self-pay | Admitting: Cardiology

## 2021-07-20 ENCOUNTER — Other Ambulatory Visit (HOSPITAL_COMMUNITY): Payer: Self-pay | Admitting: *Deleted

## 2021-07-20 MED ORDER — UPTRAVI 200 MCG PO TABS: 200.0000 ug | ORAL_TABLET | Freq: Every evening | ORAL | 3 refills | Status: DC

## 2021-07-20 MED ORDER — UPTRAVI 400 MCG PO TABS: 400.0000 ug | ORAL_TABLET | Freq: Every morning | ORAL | 3 refills | Status: DC

## 2021-07-21 NOTE — Progress Notes (Signed)
Advanced Heart Failure Clinic Note   Date:  07/22/2021   PCP:  [name], PA-C  Pulmonology: Dr. Lamonte Sakai Cardiologist:  Dr. Bettina Gavia  HF Cardiologist: Dr. 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 Also has COPD (quit smoking in 2002 - 1 ppd x 25 years), HL, and HTN.  In 8/18 admitted for acute pancreatitis. Underwent lap cholecystectomy on 07/29/2017. On 8/6 he developed hypotension and was taken back for open laparotomy due to bleeding from an unclear omental source. He was given fluids and 8 units PRBC perioperatively. He says his breathing has been bad since that time.   Seen at [place] in 2018 had hi-res CT, PFTs and VQ (low prob). Felt to have Golds I COPD with asthma overlap and early pulmonary fibrosis with PAH. Thought felt pulmonary pressures may have been elevated due to recent illness. Referred to Rheumatology though appt not until May. Suggested possible RHC.   He was also seen by Cardiology. Echo 8/18 as below showed normal LV function with septal flattening and RVSP 65-34mHG. There was no comment on RV size or function. 14-day event monitor showed 10-beat run NSVT. Felt to have chronic AF and PAH. No further testing ordered.  Underwent RHC on 01/18/18 with severe PAH and cor pulmonale. Macitentan and Adcirca.   Admitted 5/21-5/30/19 with symptomatic hypotension and weight gain. He had a repeat RHC and echo with bubble study (results below). He was started on midodrine for BP support. Diuresed 25 lbs with IV lasix, then transitioned to torsemide 60 mg daily. Abdominal UKoreashowed cirrhosis with no evidence of portal HTN. Pulm consulted for possible ILD on High-res chest CT. Ortho consulted for right rotator cuff tear from horse accident PTA. He was treated with antibiotics for Bacteremia. DC  weight: 140 lbs.   Uptravi cut 800/600 -> 600/400 due to SEs (severe cramping)  Seen in ED on 01/27/21 due to hypotension due to COVID and diarrhea. He recovered. He had stable NYHA II symptoms at follow up 4/22, repeat echo scheduled (see results below).  [facility], [facility]. [facility]. Losartan stopped at discharge due to low BP.   Today he returns for HF follow up. He called a couple weeks ago with worsening swelling despite metolazone use. I advised continuing previous torsemide dosing. Swelling much improved but now he is struggling with lower BP at home, 90s-110s; he feels best w/ sBP 120s. Denies CP, dizziness, edema, or PND/Orthopnea. Appetite ok. No fever or chills. Weight at home 129 pounds. Taking all medications, last took metolazone last week. He is keeping fluids <2L/day.   Studies: - Echo (4/22): EF 50-55%, RV ok, severe RVSP 61.6 mmHg, severe biatrial enlargement, moderate TR, stable AVR, AV mean gradient 11 mmHg  - Echo (3/21): EF 45-50% AVR ok. Septal flattening RV dilated mild HK. Moderate TR RVSP 80 severe bilateral enlargement.   - RHC 05/14/18 showed Mild/Moderate PAH in setting of high output with no evidence of intracardiac shunting. Hemodynamics as below.    - High-Res Chest CT 05/16/18 1. Slightly irregular 0.9 cm peripheral right middle lobe solid pulmonary nodule. 3 month follow up recommended.  2. Dependent basilar predominant patchy subpleural reticulation and ground-glass attenuation with the suggestion of minimal associated traction bronchiolectasis. No frank  honeycombing. Findings may indicate an interstitial lung disease such as early usual interstitial pneumonia (UIP) or fibrotic phase nonspecific interstitial pneumonia (NSIP). Suggest a follow-up high-resolution chest CT study in 6-12 months to assess temporal pattern stability, as clinically warranted. 3. Mild cardiomegaly.  Minimal interlobular septal thickening and trace dependent bilateral pleural effusions.  4. Small to moderate volume perihepatic ascites. 5. Ectatic 4.3 cm ascending thoracic aorta. Recommend annual imaging followup by CTA or MRA.  6. Left main and 3 vessel coronary atherosclerosis.   - US Abdomen RUQ 05/16/18 - s/p cholecystectomy. + hepatic cirrhosis. Mild ascites.    - Liver Doppler 05/16/18 - No hepatic, splenic, or portal venous thrombosis or occlusion. Mild ascites.   - Echo with bubble study 05/16/18 LVEF 55-60% with "late bubbles" , Mechanical AVR stable with trivial perivalvular regurg, Mild MR, Severe LAE, Severe RV dilation and reduced function. No PFO. Mod TR, PA peak pressure 68 mm Hg   - RHC 05/14/18 RA = 18 RV = 71/17 PA = 69/24 (39) PCW = 20 Fick cardiac output/index = 7.0/3.7 Thermo CO/CI = 7.2/3.8 PVR = 2.6 WU Ao sat = 97% PA sat =  73%, 73% SVC sat = 73%  RA sat = 72%  - RHC 01/18/18 RA = 23 RV = 84/21 PA = 81/38 (53) PCW = 22 Fick cardiac output/index = 3.0/1.6 PVR = 10.4 WU FA sat = 98% on 2L  (checked on RA = 93%) PA sat = 60%, 61% SVC sat = 62%  -14 Day Patch 10/01/2017-10/15/2017: -1 run of Ventricular Tachycardia occurred lasting 10 beats with a max rate of 240 bpm (avg 161 bpm). -Atrial Flutter occurred continuously (100% burden), ranging from 55-132 bpm (avg of 87 bpm).   - NM Ventilation and Perfusion Lung Scan 10/31/2017:  Low probability of pulmonary embolism, using the PIOPED criteria.  - PFTs 07/02/2017:       FVC     86% FEV1   85% FEV1/FVC  73% DLCO  8.2, 32%   - 6MW -  1400 feet; 97% to 85% (recovered with resting)                                    CT Chest 07/02/2017: 1. Stable right middle lobe pulmonary nodule measuring up to 1 cm. 2. Minimal subpleural reticulation without evidence of honeycombing to  suggest pulmonary fibrosis. Stable centrilobular emphysema. 3. Mediastinal lymphadenopathy, increased from prior. 4.  Calcified right hilar lymph nodes and multiple calcified granulomas  throughout the right lung consistent with prior granulomatous disease. 5. Ascending thoracic aortic aneurysm measuring up to 4.5 cm, unchanged. 6. Dilatation of the main pulmonary artery suggestive of pulmonary  hypertension. 7. Cardiomegaly and dense atherosclerotic coronary artery calcifications.   Review of systems complete and found to be negative unless listed in HPI.   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 In light of concurrent left nephrolithiasis, will check CT renal colic next year instead of renal US.    Anticoagulated on Coumadin 01/04/2018   Anxiety 05/10/2016   Last Assessment & Plan:  Doing well off of zoloft.   Asthma    Asymptomatic microscopic hematuria 06/20/2017   Last Assessment & Plan:  Had  hematuria workup in [facility] in [facility] which negative CT and cystoscopy. UA with 2+ blood last visit - we discussed recommendation for repeat workup at 5 years or if degree of hematuria progresses.    Atrial fibrillation ([place]) 03/29/2017   Atrial flutter ([place]) 03/29/2017   Backache 12/14/2015   Last Assessment & Plan:  Pain management referral for further evaluation.   Bilateral pleural effusion 08/09/2017   CHF (congestive heart failure) (HCC)    Chronic allergic rhinitis 04/28/2016   Last Assessment & Plan:  Continue astelin   Chronic anticoagulation 03/29/2017   Chronic atrial fibrillation ([place]) 07/23/2015   Last Assessment & Plan:  Coumadin and metoprolol, cardiology referral to establish care.   Chronic midline back pain 12/14/2015   Last Assessment & Plan:  Pain management referral for further evaluation.   Chronic obstructive lung disease ([place]) 06/12/2016   With hypoxia   Chronic prostatitis 07/23/2015   Last Assessment & Plan:   Has largely resolved since stopping bike riding. Recommend annual DRE AND PSA - will see back 12/2015 for annual screening, given 1st degree fhx. To call office for recurrent prostatitis symptoms.    Chronic respiratory failure with hypoxia ([place]) 07/29/2019   Cirrhosis of liver not due to alcohol ([place]) 07/01/2019   COPD (chronic obstructive pulmonary disease) ([place]) 06/12/2016   Coronary arteriosclerosis in native artery 03/29/2017   Coronary artery disease involving native coronary artery of native heart with angina pectoris ([place]) 03/29/2017   Cough 10/17/2016   Last Assessment & Plan:  Discussed typical course for acute viral illness. If symptoms worsen or fail to improve by 7-10d, delayed ATBs, fluids, rest, NSAIDs/APAP prn. Seek care if not improving. Needs earlier INR check due to ATBs.   Dyspnea 02/01/2016   Last Assessment & Plan:  Overall improving, eval by pulm, plan for CT, neg stress test with cardiology. Recent switch to carvedilol due to side effects.   Encounter for therapeutic drug monitoring 01/06/2019   Enterococcal bacteremia    Epidermoid cyst of skin 08/24/2017   Essential hypertension 12/14/2015   Last Assessment & Plan:  Hypertension control: controlled  Medications: compliant Medication Management: as noted in orders Home blood pressure monitoring recommended additionally as needed for symptoms  The patient's care plan was reviewed and updated. Instructions and counseling were provided regarding patient goals and barriers. He was counseled to adopt a healthy lifestyle. Educational resources and self-management tools have been provided as charted in [facility] list.    H/O maze procedure 03/29/2017   [name] 03/29/2017   Overview:  2011   History of coronary artery bypass graft 03/29/2017   Hx of CABG 03/29/2017   Hyperlipidemia 03/29/2017   Hypersensitivity angiitis ([place]) 10/01/2017   Hypertensive heart disease 03/29/2017   Hypertensive heart disease with heart failure  ([place]) 03/29/2017   Hypertensive heart failure ([place]) 03/29/2017   Hypokalemia due to excessive renal loss of potassium 02/18/2018   Hypotension, chronic 06/06/2018   International normalized ratio (INR) raised 07/26/2017   Kidney stone 07/23/2015   Kidney stones 07/23/2015   Overview:  x 3  Last Assessment & Plan:  By [place] has left nephrolithiasis, but not visible by KUB. Will check CT renal colic next year to assess both stone burden as well as to surveil AML.    Left ureteral stone 01/23/2018   Leukocytoclastic vasculitis ([place]) 10/01/2017   Localized edema 01/04/2018   Long term (current) use of anticoagulants 03/29/2017   Lumbar radicular pain 01/19/2016   Lumbar  radiculopathy 01/19/2016   Maculopapular rash 09/03/2017   Microscopic hematuria 06/20/2017   Last Assessment & Plan:  Had hemat[facility] in 2016 which negative CT and cystoscopy. UA with 2+ blood last visit - we discussed recommendation for repeat workup at 5 years or if degree of hematuria progresses.    Multiple nodules of lung 06/12/2016   Nasal discharge 02/25/2016   Last Assessment & Plan:  Trial zyrtec and flonase   Nephrolithiasis 07/23/2015   Overview:  x 3  Last Assessment & Plan:  By [place] has left nephrolithiasis, but not visible by KUB. Will check CT renal colic next year to assess both stone burden as well as to surveil AML.   Overview:  x 3  Last Assessment & Plan:  Has 80m nonobstructing LUP stone - not visible by KUB.  Will check renal [place]/2019 - he will contact office sooner if symptomatic.    Non-sustained ventricular tachycardia ([place] 03/29/2017   Nonsustained ventricular tachycardia ([place] 03/29/2017   Other hyperlipidemia 03/29/2017   Palpitations 10/01/2017   Pleural effusion, bilateral 08/09/2017   Pneumonia due to COVID-19 virus 02/07/2021   Post-nasal drainage 02/25/2016   Last Assessment & Plan:  Trial zyrtec and flonase   Prostate cancer screening 06/20/2017   Last Assessment & Plan:  Recommend continued annual CaP screening  until within 10 years of life expectancy. Given good health and fhx of longevity, would anticipate CaP screening to continue until age 75  PSA today and again in one year on day of visit.   Pulmonary arterial hypertension ([place] 02/20/2018   Pulmonary edema    Pulmonary hypertension ([place] 08/09/2017   Pulmonary nodules 06/12/2016   S/P AVR 03/20/2016   S/P AVR (aortic valve replacement) 03/20/2016   Strain of deltoid muscle, initial encounter    Supratherapeutic INR 07/26/2017   Syncope and collapse 02/01/2016   Typical atrial flutter ([place] 02/01/2016   Past Surgical History:  Procedure Laterality Date   CHOLECYSTECTOMY     CORONARY ARTERY BYPASS GRAFT     EXPLORATORY LAPAROTOMY  07/30/2017   FOOT SURGERY     FRACTURE SURGERY Right    wrist and forearm   HERNIA REPAIR     MECHANICAL AORTIC VALVE REPLACEMENT     NASAL SINUS SURGERY     RIGHT HEART CATH N/A 01/18/2018   Procedure: RIGHT HEART CATH;  Surgeon: BJolaine Artist MD;  [facility];  Service: Cardiovascular;  Laterality: N/A;   RIGHT HEART CATH N/A 05/14/2018   Procedure: RIGHT HEART CATH;  Surgeon: BJolaine Artist MD;  [facility];  Service: Cardiovascular;  Laterality: N/A;   TEE WITHOUT CARDIOVERSION N/A 05/21/2018   Procedure: TRANSESOPHAGEAL ECHOCARDIOGRAM (TEE);  Surgeon: BJolaine Artist MD;  [facility];  Service: Cardiovascular;  Laterality: N/A;   UPPER GASTROINTESTINAL ENDOSCOPY  07/12/2017   Patchy areas of mucosal inflammation noted in the antrum with edema,erthema and ulcerations. Bx. Chronicfocally active gastritis.   VASECTOMY     Current Medications: Current Meds  Medication Sig   albuterol (VENTOLIN HFA) 108 (90 Base) MCG/ACT inhaler Inhale 2 puffs into the lungs every 6 (six) hours as needed for wheezing or shortness of breath.   aspirin EC 81 MG tablet Take 81 mg by mouth daily.   B Complex-C (B-COMPLEX WITH VITAMIN C) tablet Take 1 tablet by mouth daily.  Unknown strength   cetirizine (ZYRTEC) 10 MG tablet Take 10 mg by mouth daily.   Cholecalciferol (  VITAMIN D) 125 MCG (5000 UT) CAPS Take 1 capsule by mouth daily.   Coenzyme Q10 (COQ10 PO) Take 1 capsule by mouth daily.   KRILL OIL PO Take 1 capsule by mouth daily.   Magnesium Gluconate (MAGNESIUM 27 PO) Take 1 tablet by mouth every other day.    metolazone (ZAROXOLYN) 2.5 MG tablet TAKE 1 TABLET(2.5 MG) BY MOUTH DAILY AS NEEDED FOR FLUID RETENTION   midodrine (PROAMATINE) 10 MG tablet Take 1 tablet (10 mg total) by mouth 3 (three) times daily with meals.   ondansetron (ZOFRAN ODT) 4 MG disintegrating tablet Take 1 tablet (4 mg total) by mouth every 8 (eight) hours as needed for nausea or vomiting.   OPSUMIT 10 MG tablet TAKE 1 TABLET DAILY   potassium chloride SA (KLOR-CON) 20 MEQ tablet Take 2 tablets (40 mEq total) by mouth 2 (two) times daily.   rosuvastatin (CRESTOR) 5 MG tablet TAKE 1 TABLET(5 MG) BY MOUTH DAILY   Selexipag (UPTRAVI) 200 MCG TABS Take 1 tablet (200 mcg total) by mouth every evening.   Selexipag (UPTRAVI) 400 MCG TABS Take 400 mcg by mouth every morning.   spironolactone (ALDACTONE) 25 MG tablet TAKE 1/2 TABLET BY MOUTH EVERY DAY   torsemide (DEMADEX) 20 MG tablet Take 60 mg daily alternating with 40 mg daily   warfarin (COUMADIN) 5 MG tablet TAKE 1/2 TO 1 TABLET BY MOUTH DAILY AS DIRECTED BY COUMADIN CLINIC    Allergies:   Amoxicillin-pot clavulanate, Pantoprazole sodium, Tape, and Wound dressing adhesive   Social History   Socioeconomic History   Marital status: Single    Spouse name: Not on file   Number of children: Not on file   Years of education: Not on file   Highest education level: Not on file  Occupational History   Not on file  Tobacco Use   Smoking status: Former    Packs/day: 2.00    Years: 34.00    Pack years: 68.00    Types: Cigarettes    Quit date: 07/16/2000    Years since quitting: 21.0   Smokeless tobacco: Never  Vaping Use   Vaping  Use: Never used  Substance and Sexual Activity   Alcohol use: Not Currently   Drug use: No   Sexual activity: Not on file  Other Topics Concern   Not on file  Social History Narrative   Not on file   Social Determinants of Health   Financial Resource Strain: Not on file  Food Insecurity: Not on file  Transportation Needs: Not on file  Physical Activity: Not on file  Stress: Not on file  Social Connections: Not on file    Family History: The patient's family history includes Arthritis in his mother; Asthma in his mother; Heart attack in his father; Hypertension in his father; Stroke in his paternal grandmother. There is no history of Colon cancer.  Recent Labs: 06/17/2021: Brain Natriuretic Peptide 508; Hemoglobin 12.0; Platelets 206 07/04/2021: ALT 24; Pro B Natriuretic peptide (BNP) 425.0 07/22/2021: BUN 28; Creatinine, Ser 1.30; Potassium 3.8; Sodium 131  Recent Lipid Panel    Component Value Date/Time   CHOL 133 10/12/2020 1104   TRIG 111 10/12/2020 1104   HDL 44 10/12/2020 1104   CHOLHDL 3.0 10/12/2020 1104   VLDL 11 10/21/2018 1028   LDLCALC 70 10/12/2020 1104   BP 132/70   Pulse 67   Wt 63.2 kg (139 lb 6.4 oz)   BMI 20.59 kg/m     Wt Readings from Last  3 Encounters:  07/22/21 63.2 kg (139 lb 6.4 oz)  07/04/21 61.2 kg (135 lb)  06/17/21 67 kg (147 lb 12.8 oz)   Physical Exam:  General:  NAD. No resp difficulty, thin, walked into clinic. HEENT: Normal Neck: Supple. JVP 7-8. Carotids 2+ bilat; no bruits. No lymphadenopathy or thryomegaly appreciated. Cor: PMI nondisplaced. Irregular rate & rhythm. No rubs, gallops or murmurs. + mechanical s2 Lungs: Clear Abdomen: Soft, nontender, nondistended. No hepatosplenomegaly. No bruits or masses. Good bowel sounds. Extremities: No cyanosis, clubbing, rash, edema Neuro: Alert & oriented x 3, cranial nerves grossly intact. Moves all 4 extremities w/o difficulty. Affect pleasant.  ECG: Atrial fibrillation w/ PVC 60 bpm  (personally reviewed).  Assessment/Plan 1. Pulmonary hypertension with cor pulmonale/RV failure - WHO Group I  - ? Component of HHT/shunt/AVMs with late bubbles on bubble study.  - Echo with bubble study (05/16/18): EF 55-60% with "late bubbles" , Mechanical AVR stable with trivial perivalvular regurg, Mild MR, Severe LAE, Severe RV dilation and reduced function. No PFO. Mod TR, PA peak pressure 68 mm Hg - Echo (10/19): EF 55-60% mild LVH; s/p AVR with mean gradient 11 mmHg and trace AI; mild MR; severe biatrial enlargement; mild RVE; severe TR; severe pulmonary hypertension. RVSP 36mHG - Echo (03/17/20): EF 45-50% AVR ok. Septal flattening RV dilated mild HK. Moderate TR RVSP 80 severe bilateral enlargement. Personally reviewed - Echo (4/22): EF 50-55%, RV ok, severe RVSP 61.6 mmHg, severe biatrial enlargement, moderate TR, stable AVR, AV mean gradient 11 mmHg - RHC (1/19): with mod/severe pulm HTN with RV failure.   - RHC (05/14/18) with Mild/Moderate PAH in setting of high output with no evidence of intracardiac shunting. - Ab u/s with cirrhosis but no evidence of portal HTN. - Improved with Macitentan and Adcirca. Uptravi now up to 400 bid. Once on stable dose of Uptravi for 3 months will repeat RHC. - Stable NYHA II. Volume status mildly elevated on exam, ReDs 41%. - Increase torsemide to 40 mg bid x 3 days then back to 60 mg daily followed by 40 mg alternating days. BMET today, repeat in 10 days. - Continue spiro 12.5 mg daily. - He thinks he does not take losartan anymore. It was stopped during recent hospitalization. I have asked him to go home and check his pill bottles. If he has been taking, needs to cut back to 12.5 mg. - Continue midodrine 15 mg bid (it was 10 tid but he says it works better this way). Discussed taking 15 mg tid if needed for low BP, especially in evenings. - Auto-immune serologies negative (checked twice). - BMET today, repeat in 10 days..  2. Chronic respiratory  failure - [place] CT 05/16/18 with "dependent basilar predominant patchy subpleural reticulation and ground-glass attenuation with the suggestion of minimal associated traction bronchiolectasis. No frank honeycombing. Findings may indicate an interstitial lung disease such as early usual interstitial pneumonia (UIP) or fibrotic phase nonspecific interstitial pneumonia (NSIP)." - Followed by Dr. BLamonte Sakai - Stable NYHA II   3. Chronic AFL - Rate controlled.  Now off metoprolol.  - Continue coumadin with mechanical AVR. - [place] [facility] [place]   4. CAD - s/p CABG 07/2017 with Dr ARoderic Palauin CRuskin  - No s/s ischemia - Continue Crestor 5 mg daily (unable to tolerate atorva due to myalgias).  - Lipids followed by Dr. MBettina Gavia   5. S/p mechanical AVR - Stable on echo 4/22. - Continue coumadin/ASA 81. INR goal is 2.5-3  per Dr Bettina Gavia. - Aware of need for SBE prophylaxis   6. Leukocytoclastic vasculitis - R/u with Rheumatology.  - No changes.   7. Cirrhosis - US Abdomen RUQ 05/16/18 - s/p cholecystectomy. + hepatic cirrhosis. Mild ascites.  - Liver Doppler 05/16/18 - No hepatic, splenic, or portal venous thrombosis or occlusion. Mild ascites. - Continue midodrine 15 mg bid (it was 10 tid but he says it works better this way)  8. Iron-def anemia - Followed by PCP  Discussed bringing all medications with him to his appointment next time.  He has been stable on Uptravi 400 mg bid dose since 4/22. Follow up with Dr. Haroldine Laws 4-6 weeks to discuss timing of RHC.   [place], FNP  07/22/21

## 2021-07-22 ENCOUNTER — Other Ambulatory Visit: Payer: Self-pay

## 2021-07-22 ENCOUNTER — Encounter (HOSPITAL_COMMUNITY): Payer: Self-pay

## 2021-07-22 ENCOUNTER — Ambulatory Visit (HOSPITAL_COMMUNITY)
Admission: RE | Admit: 2021-07-22 | Discharge: 2021-07-22 | Disposition: A | Discharge status: Home / Self Care | Payer: Medicare Other | Source: Ambulatory Visit | Attending: Family Medicine | Admitting: Family Medicine

## 2021-07-22 VITALS — BP 132/70 | HR 67 | Wt 139.4 lb

## 2021-07-22 DIAGNOSIS — Z7901 Long term (current) use of anticoagulants: Secondary | ICD-10-CM | POA: Insufficient documentation

## 2021-07-22 DIAGNOSIS — I110 Hypertensive heart disease with heart failure: Secondary | ICD-10-CM | POA: Diagnosis not present

## 2021-07-22 DIAGNOSIS — I509 Heart failure, unspecified: Secondary | ICD-10-CM | POA: Diagnosis not present

## 2021-07-22 DIAGNOSIS — K7460 Unspecified cirrhosis of liver: Secondary | ICD-10-CM | POA: Insufficient documentation

## 2021-07-22 DIAGNOSIS — Z9049 Acquired absence of other specified parts of digestive tract: Secondary | ICD-10-CM | POA: Insufficient documentation

## 2021-07-22 DIAGNOSIS — I25119 Atherosclerotic heart disease of native coronary artery with unspecified angina pectoris: Secondary | ICD-10-CM

## 2021-07-22 DIAGNOSIS — J9610 Chronic respiratory failure, unspecified whether with hypoxia or hypercapnia: Secondary | ICD-10-CM | POA: Insufficient documentation

## 2021-07-22 DIAGNOSIS — M310 Hypersensitivity angiitis: Secondary | ICD-10-CM | POA: Insufficient documentation

## 2021-07-22 DIAGNOSIS — R188 Other ascites: Secondary | ICD-10-CM | POA: Diagnosis not present

## 2021-07-22 DIAGNOSIS — E785 Hyperlipidemia, unspecified: Secondary | ICD-10-CM | POA: Insufficient documentation

## 2021-07-22 DIAGNOSIS — I083 Combined rheumatic disorders of mitral, aortic and tricuspid valves: Secondary | ICD-10-CM | POA: Diagnosis not present

## 2021-07-22 DIAGNOSIS — I4821 Permanent atrial fibrillation: Secondary | ICD-10-CM | POA: Insufficient documentation

## 2021-07-22 DIAGNOSIS — Z79899 Other long term (current) drug therapy: Secondary | ICD-10-CM | POA: Insufficient documentation

## 2021-07-22 DIAGNOSIS — Z951 Presence of aortocoronary bypass graft: Secondary | ICD-10-CM | POA: Insufficient documentation

## 2021-07-22 DIAGNOSIS — Z880 Allergy status to penicillin: Secondary | ICD-10-CM | POA: Diagnosis not present

## 2021-07-22 DIAGNOSIS — Z8616 Personal history of COVID-19: Secondary | ICD-10-CM | POA: Insufficient documentation

## 2021-07-22 DIAGNOSIS — Z7982 Long term (current) use of aspirin: Secondary | ICD-10-CM | POA: Diagnosis not present

## 2021-07-22 DIAGNOSIS — I2721 Secondary pulmonary arterial hypertension: Secondary | ICD-10-CM | POA: Diagnosis not present

## 2021-07-22 DIAGNOSIS — I4892 Unspecified atrial flutter: Secondary | ICD-10-CM | POA: Diagnosis not present

## 2021-07-22 DIAGNOSIS — J449 Chronic obstructive pulmonary disease, unspecified: Secondary | ICD-10-CM | POA: Diagnosis not present

## 2021-07-22 DIAGNOSIS — K7469 Other cirrhosis of liver: Secondary | ICD-10-CM

## 2021-07-22 DIAGNOSIS — D509 Iron deficiency anemia, unspecified: Secondary | ICD-10-CM | POA: Diagnosis not present

## 2021-07-22 DIAGNOSIS — Z952 Presence of prosthetic heart valve: Secondary | ICD-10-CM | POA: Diagnosis not present

## 2021-07-22 DIAGNOSIS — Z888 Allergy status to other drugs, medicaments and biological substances status: Secondary | ICD-10-CM | POA: Diagnosis not present

## 2021-07-22 DIAGNOSIS — I2510 Atherosclerotic heart disease of native coronary artery without angina pectoris: Secondary | ICD-10-CM | POA: Diagnosis not present

## 2021-07-22 DIAGNOSIS — Z8719 Personal history of other diseases of the digestive system: Secondary | ICD-10-CM | POA: Insufficient documentation

## 2021-07-22 DIAGNOSIS — Z8249 Family history of ischemic heart disease and other diseases of the circulatory system: Secondary | ICD-10-CM | POA: Diagnosis not present

## 2021-07-22 LAB — BASIC METABOLIC PANEL
Anion gap: 10 (ref 5–15)
BUN: 28 mg/dL — ABNORMAL HIGH (ref 8–23)
CO2: 23 mmol/L (ref 22–32)
Calcium: 9.5 mg/dL (ref 8.9–10.3)
Chloride: 98 mmol/L (ref 98–111)
Creatinine, Ser: 1.3 mg/dL — ABNORMAL HIGH (ref 0.61–1.24)
GFR, Estimated: 58 mL/min — ABNORMAL LOW (ref >60)
Glucose, Bld: 103 mg/dL — ABNORMAL HIGH (ref 70–99)
Potassium: 3.8 mmol/L (ref 3.5–5.1)
Sodium: 131 mmol/L — ABNORMAL LOW (ref 135–145)

## 2021-07-22 NOTE — Patient Instructions (Signed)
Labs done today. We will contact you only if your labs are abnormal.  INCREASE Torsemide to 65m (2 tablets) by mouth 2 times daily for 3 days THEN DECREASE back down to 664m(3 tablets) by mouth daily alternating with 4074m2 tablets) by mouth daily.   No other medication changes were made. Please continue all current medications as prescribed.  Your physician recommends that you schedule a follow-up appointment in: 10 days for a lab only appointment and in 4-6 weeks with Dr. BenHaroldine Laws If you have any questions or concerns before your next appointment please send us [place]essage through myc[place] call our office at [phone]  TO LEAVE A MESSAGE FOR THE NURSE SELECT OPTION 2, PLEASE LEAVE A MESSAGE INCLUDING: YOUR NAME DATE OF BIRTH CALL BACK NUMBER REASON FOR CALL**this is important as we prioritize the call backs  YOU WILL RECEIVE A CALL BACK THE SAME DAY AS LONG AS YOU CALL BEFORE 4:00 PM   Do the following things EVERYDAY: Weigh yourself in the morning before breakfast. Write it down and keep it in a log. Take your medicines as prescribed Eat low salt foods--Limit salt (sodium) to 2000 mg per day.  Stay as active as you can everyday Limit all fluids for the day to less than 2 liters   At [facility] and your health needs are our priority. As part of our continuing mission to provide you with exceptional heart care, we have created designated Provider Care Teams. These Care Teams include your primary Cardiologist (physician) and Advanced Practice Providers (APPs- Physician Assistants and Nurse Practitioners) who all work together to provide you with the care you need, when you need it.   You may see any of the following providers on your designated Care Team at your next follow up: Dr DanGlori Bickers DalHaynes KernsP BriLyda JesterA UtahuAudry RilesharmD   Please be sure to bring in all your medications bottles to every  appointment.

## 2021-07-22 NOTE — Progress Notes (Signed)
ReDS Vest / Clip - 07/22/21 1400       ReDS Vest / Clip   Station Marker C    Ruler Value 26.5    ReDS Value Range High volume overload    ReDS Actual Value 41

## 2021-07-27 ENCOUNTER — Ambulatory Visit (INDEPENDENT_AMBULATORY_CARE_PROVIDER_SITE_OTHER): Payer: Medicare Other | Admitting: Internal Medicine

## 2021-07-27 ENCOUNTER — Encounter: Payer: Self-pay | Admitting: Internal Medicine

## 2021-07-27 ENCOUNTER — Other Ambulatory Visit: Payer: Self-pay

## 2021-07-27 VITALS — BP 157/73 | HR 66 | Temp 97.9°F | Wt 138.0 lb

## 2021-07-27 DIAGNOSIS — A770 Spotted fever due to Rickettsia rickettsii: Secondary | ICD-10-CM

## 2021-07-27 NOTE — Patient Instructions (Signed)
You were infected with RMSF and well treated  This is an acute disease without complication once you recovered   Please use insect repellant if you are out in the yard or outdoor near vegetation to avoid tick bites   No further need for ID follow up

## 2021-07-27 NOTE — Progress Notes (Signed)
[facility] for Infectious Disease  Reason for Consult:rmsf Referring Provider: Saguier    Patient Active Problem List   Diagnosis Date Noted   History of COVID-19 04/05/2021   Asthma    Pneumonia due to COVID-19 virus 02/07/2021   Chronic respiratory failure with hypoxia ([place]) 07/29/2019   Cirrhosis of liver not due to alcohol ([place]) 07/01/2019   Anemia 07/01/2019   Hypotension, chronic 06/06/2018   Enterococcal bacteremia    Pulmonary edema    Strain of deltoid muscle, initial encounter    Pulmonary arterial hypertension ([place]) 02/20/2018   Hypokalemia due to excessive renal loss of potassium 02/18/2018   Left ureteral stone 01/23/2018   Anticoagulated on Coumadin 01/04/2018   Localized edema 01/04/2018   Leukocytoclastic vasculitis ([place]) 10/01/2017   Palpitations 10/01/2017   Hypersensitivity angiitis ([place]) 10/01/2017   Maculopapular rash 09/03/2017   Epidermoid cyst of skin 08/24/2017   Bilateral pleural effusion 08/09/2017   Pleural effusion, bilateral 08/09/2017   Supratherapeutic INR 07/26/2017   International normalized ratio (INR) raised 07/26/2017   Microscopic hematuria 06/20/2017   Prostate cancer screening 06/20/2017   Asymptomatic microscopic hematuria 06/20/2017   Atrial flutter ([place]) 03/29/2017   Chronic anticoagulation 03/29/2017   Coronary artery disease involving native coronary artery of native heart with angina pectoris ([place]) 03/29/2017   H/O maze procedure 03/29/2017   History of coronary artery bypass graft 03/29/2017   Hypertensive heart disease with heart failure ([place]) 03/29/2017   Nonsustained ventricular tachycardia ([place]) 03/29/2017   Hyperlipidemia 03/29/2017   Syncope 03/29/2017   Coronary arteriosclerosis in native artery 03/29/2017   Hypertensive heart failure ([place]) 03/29/2017   Long term (current) use of anticoagulants 03/29/2017   Paroxysmal atrial fibrillation ([place]) 03/29/2017   Other hyperlipidemia 03/29/2017    Non-sustained ventricular tachycardia ([place]) 03/29/2017   Hypertensive heart disease 03/29/2017   Hx of CABG 03/29/2017   Atrial fibrillation ([place]) 03/29/2017   Cough 10/17/2016   Chronic obstructive lung disease ([place]) 06/12/2016   Pulmonary nodules 06/12/2016   Multiple nodules of lung 06/12/2016   COPD (chronic obstructive pulmonary disease) ([place]) 06/12/2016   Anxiety 05/10/2016   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   Allergic rhinitis 04/28/2016   [name] 03/20/2016   S/P AVR 03/20/2016   S/P AVR (aortic valve replacement) 03/20/2016   Nasal discharge 02/25/2016   Post-nasal drainage 02/25/2016   Dyspnea 02/01/2016   Syncope and collapse 02/01/2016   Typical atrial flutter ([place]) 02/01/2016   Lumbar radicular pain 01/19/2016   Lumbar radiculopathy 01/19/2016   Backache 12/14/2015   Essential hypertension 12/14/2015   Chronic midline back pain 12/14/2015   Chronic prostatitis 07/23/2015   Nephrolithiasis 07/23/2015   Kidney stone 07/23/2015   Chronic atrial fibrillation ([place]) 07/23/2015   Kidney stones 07/23/2015      HPI: Lance Hicks is a 75 y.o. male chf, afib, recent rmsf [facility]  Reviewed chart from [facility] and labs  Patient presented with flu like syndrome and sepsis with elevated lft and aki. No rash/diarrhea His rmsf serology was 1:256 for igg no igm checked. This returned post discharge He was given 10 day doxy in the hospital On f/u with his pcp another course doxy was given but no worsening sx at that time He improved quickly in the hospital and was discharged  He followed up with pcp who referred him to ID   Patient is feeling well at baseline health.  Review  of Systems: ROS All other ros negative      Past Medical History:  Diagnosis Date   Allergic rhinitis 04/28/2016   Last Assessment & Plan:  Continue astelin   Anemia 07/01/2019   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   Last  Assessment & Plan:  Stable in size on annual imaging. In light of concurrent left nephrolithiasis, will check CT renal colic next year instead of renal US.    Anticoagulated on Coumadin 01/04/2018   Anxiety 05/10/2016   Last Assessment & Plan:  Doing well off of zoloft.   Asthma    Asymptomatic microscopic hematuria 06/20/2017   Last Assessment & Plan:  Had hematuria workup in [facility] in 2016 which negative CT and cystoscopy. UA with 2+ blood last visit - we discussed recommendation for repeat workup at 5 years or if degree of hematuria progresses.    Atrial fibrillation ([place]) 03/29/2017   Atrial flutter ([place]) 03/29/2017   Backache 12/14/2015   Last Assessment & Plan:  Pain management referral for further evaluation.   Bilateral pleural effusion 08/09/2017   CHF (congestive heart failure) (HCC)    Chronic allergic rhinitis 04/28/2016   Last Assessment & Plan:  Continue astelin   Chronic anticoagulation 03/29/2017   Chronic atrial fibrillation ([place]) 07/23/2015   Last Assessment & Plan:  Coumadin and metoprolol, cardiology referral to establish care.   Chronic midline back pain 12/14/2015   Last Assessment & Plan:  Pain management referral for further evaluation.   Chronic obstructive lung disease ([place]) 06/12/2016   With hypoxia   Chronic prostatitis 07/23/2015   Last Assessment & Plan:  Has largely resolved since stopping bike riding. Recommend annual DRE AND PSA - will see back 12/2015 for annual screening, given 1st degree fhx. To call office for recurrent prostatitis symptoms.    Chronic respiratory failure with hypoxia ([place]) 07/29/2019   Cirrhosis of liver not due to alcohol ([place]) 07/01/2019   COPD (chronic obstructive pulmonary disease) ([place]) 06/12/2016   Coronary arteriosclerosis in native artery 03/29/2017   Coronary artery disease involving native coronary artery of native heart with angina pectoris ([place]) 03/29/2017   Cough 10/17/2016   Last Assessment & Plan:  Discussed typical course for acute viral  illness. If symptoms worsen or fail to improve by 7-10d, delayed ATBs, fluids, rest, NSAIDs/APAP prn. Seek care if not improving. Needs earlier INR check due to ATBs.   Dyspnea 02/01/2016   Last Assessment & Plan:  Overall improving, eval by pulm, plan for CT, neg stress test with cardiology. Recent switch to carvedilol due to side effects.   Encounter for therapeutic drug monitoring 01/06/2019   Enterococcal bacteremia    Epidermoid cyst of skin 08/24/2017   Essential hypertension 12/14/2015   Last Assessment & Plan:  Hypertension control: controlled  Medications: compliant Medication Management: as noted in orders Home blood pressure monitoring recommended additionally as needed for symptoms  The patient's care plan was reviewed and updated. Instructions and counseling were provided regarding patient goals and barriers. He was counseled to adopt a healthy lifestyle. Educational resources and self-management tools have been provided as charted in [facility] list.    H/O maze procedure 03/29/2017   [name] 03/29/2017   Overview:  2011   History of coronary artery bypass graft 03/29/2017   Hx of CABG 03/29/2017   Hyperlipidemia 03/29/2017   Hypersensitivity angiitis ([place]) 10/01/2017   Hypertensive heart disease 03/29/2017   Hypertensive heart disease with heart failure ([place]) 03/29/2017   Hypertensive  heart failure ([place]) 03/29/2017   Hypokalemia due to excessive renal loss of potassium 02/18/2018   Hypotension, chronic 06/06/2018   International normalized ratio (INR) raised 07/26/2017   Kidney stone 07/23/2015   Kidney stones 07/23/2015   Overview:  x 3  Last Assessment & Plan:  By [place] has left nephrolithiasis, but not visible by KUB. Will check CT renal colic next year to assess both stone burden as well as to surveil AML.    Left ureteral stone 01/23/2018   Leukocytoclastic vasculitis ([place]) 10/01/2017   Localized edema 01/04/2018   Long term (current) use of anticoagulants 03/29/2017   Lumbar  radicular pain 01/19/2016   Lumbar radiculopathy 01/19/2016   Maculopapular rash 09/03/2017   Microscopic hematuria 06/20/2017   Last Assessment & Plan:  Had hematuria workup in [facility] in 2016 which negative CT and cystoscopy. UA with 2+ blood last visit - we discussed recommendation for repeat workup at 5 years or if degree of hematuria progresses.    Multiple nodules of lung 06/12/2016   Nasal discharge 02/25/2016   Last Assessment & Plan:  Trial zyrtec and flonase   Nephrolithiasis 07/23/2015   Overview:  x 3  Last Assessment & Plan:  By [place] has left nephrolithiasis, but not visible by KUB. Will check CT renal colic next year to assess both stone burden as well as to surveil AML.   Overview:  x 3  Last Assessment & Plan:  Has 66m nonobstructing LUP stone - not visible by KUB.  Will check renal [place]/2019 - he will contact office sooner if symptomatic.    Non-sustained ventricular tachycardia ([place] 03/29/2017   Nonsustained ventricular tachycardia ([place] 03/29/2017   Other hyperlipidemia 03/29/2017   Palpitations 10/01/2017   Pleural effusion, bilateral 08/09/2017   Pneumonia due to COVID-19 virus 02/07/2021   Post-nasal drainage 02/25/2016   Last Assessment & Plan:  Trial zyrtec and flonase   Prostate cancer screening 06/20/2017   Last Assessment & Plan:  Recommend continued annual CaP screening until within 10 years of life expectancy. Given good health and fhx of longevity, would anticipate CaP screening to continue until age 74  PSA today and again in one year on day of visit.   Pulmonary arterial hypertension ([place] 02/20/2018   Pulmonary edema    Pulmonary hypertension ([place] 08/09/2017   Pulmonary nodules 06/12/2016   S/P AVR 03/20/2016   S/P AVR (aortic valve replacement) 03/20/2016   Strain of deltoid muscle, initial encounter    Supratherapeutic INR 07/26/2017   Syncope and collapse 02/01/2016   Typical atrial flutter ([place] 02/01/2016    Social History   Tobacco Use   Smoking status: Former    Packs/day:  2.00    Years: 34.00    Pack years: 68.00    Types: Cigarettes    Quit date: 07/16/2000    Years since quitting: 21.0   Smokeless tobacco: Never  Vaping Use   Vaping Use: Never used  Substance Use Topics   Alcohol use: Not Currently   Drug use: No    Family History  Problem Relation Age of Onset   Asthma Mother    Arthritis Mother    Heart attack Father    Hypertension Father    Stroke Paternal Grandmother    Colon cancer Neg Hx     Allergies  Allergen Reactions   Amoxicillin-Pot Clavulanate Diarrhea and Anaphylaxis    Stomach pain Has patient had a PCN reaction causing immediate rash, facial/tongue/throat swelling, SOB or lightheadedness with hypotension:  No Has patient had a PCN reaction causing severe rash involving mucus membranes or skin necrosis: No Has patient had a PCN reaction that required hospitalization: No Has patient had a PCN reaction occurring within the last 10 years: Yes If all of the above answers are "NO", then may proceed with Cephalosporin use.  Stomach pain Has patient had a PCN reaction causing immediate rash, facial/tongue/throat swelling, SOB or lightheadedness with hypotension: No Has patient had a PCN reaction causing severe rash involving mucus membranes or skin necrosis: No Has patient had a PCN reaction that required hospitalization: No Has patient had a PCN reaction occurring within the last 10 years: Yes If all of the above answers are "NO", then may proceed with Cephalosporin use. n/v   Pantoprazole Sodium Nausea Only    Gets gassy and starting itching like crazy Gets gassy and starting itching like crazy   Tape Rash and Other (See Comments)    Surgical tape   Wound Dressing Adhesive Other (See Comments) and Rash    Surgical tape Surgical tape Surgical tape    OBJECTIVE: There were no vitals filed for this visit. There is no height or weight on file to calculate BMI.   Physical Exam General/constitutional: no distress,  pleasant HEENT: Normocephalic, PER, Conj Clear, EOMI, Oropharynx clear Neck supple CV: rrr no mrg Lungs: clear to auscultation, normal respiratory effort Abd: Soft, Nontender Ext: no edema Skin: No Rash Neuro: nonfocal MSK: no peripheral joint swelling/tenderness/warmth; back spines nontender Psych alert/oriented   Lab: Last metabolic panel Lab Results  Component Value Date   GLUCOSE 103 (H) 07/22/2021   NA 131 (L) 07/22/2021   K 3.8 07/22/2021   CL 98 07/22/2021   CO2 23 07/22/2021   BUN 28 (H) 07/22/2021   CREATININE 1.30 (H) 07/22/2021   GFRNONAA 58 (L) 07/22/2021   GFRAA 69 08/09/2020   CALCIUM 9.5 07/22/2021   PROT 8.6 (H) 07/04/2021   ALBUMIN 4.3 07/04/2021   BILITOT 1.9 (H) 07/04/2021   ALKPHOS 83 07/04/2021   AST 33 07/04/2021   ALT 24 07/04/2021   ANIONGAP 10 07/22/2021   Lab Results  Component Value Date   WBC 6.5 06/17/2021   HGB 12.0 (L) 06/17/2021   HCT 38.0 (L) 06/17/2021   MCV 84.3 06/17/2021   PLT 206 06/17/2021    Microbiology:  Serology:  Imaging:   Assessment/plan: Problem List Items Addressed This Visit   None Visit Diagnoses     RMSF [facility] [place] spotted fever)    -  Primary       75 yo male hx afib, chf, kidney stone, gilbert's syndrome, hx covid infection, irrhosis, pulm fibrosis/htn, admitted 6/26 to [facility] for elevated lft, aki, sepsis found to have elevated rmsf serology 1:256, s/p 10 day doxycycline, referred by pcp for follow up of rmfs   Previous bcx negative Leptospira, ehrlichiosis anaplasma serology/pcr negative Fungal serology negative  Lft/aki resolved during admission. Repeat labs 7/11 normalized lft  Discussed with him he doesn't need for follow up   -discharged from id clinic -advise to avoid tick bite      Follow-up: Return for discharge from id clinic.  Jabier Mutton, [place] for [place] [phone] pager   [phone] cell 07/27/2021, 2:01 PM

## 2021-07-28 NOTE — Progress Notes (Signed)
If you could use [email or web address]  Use [email or web address] for log in, select infectious disease, and then the phone id coverage.    Thanks

## 2021-08-01 ENCOUNTER — Other Ambulatory Visit (HOSPITAL_COMMUNITY): Payer: Self-pay | Admitting: Family Medicine

## 2021-08-01 DIAGNOSIS — I2721 Secondary pulmonary arterial hypertension: Secondary | ICD-10-CM

## 2021-08-02 ENCOUNTER — Ambulatory Visit (HOSPITAL_BASED_OUTPATIENT_CLINIC_OR_DEPARTMENT_OTHER)
Admission: RE | Admit: 2021-08-02 | Discharge: 2021-08-02 | Disposition: A | Discharge status: Home / Self Care | Payer: Medicare Other | Source: Ambulatory Visit | Attending: Internal Medicine | Admitting: Internal Medicine

## 2021-08-02 ENCOUNTER — Encounter (HOSPITAL_COMMUNITY): Payer: Self-pay | Admitting: Internal Medicine

## 2021-08-02 ENCOUNTER — Ambulatory Visit (HOSPITAL_COMMUNITY)
Admission: RE | Admit: 2021-08-02 | Discharge: 2021-08-02 | Disposition: A | Discharge status: Home / Self Care | Payer: Medicare Other | Source: Ambulatory Visit | Attending: Internal Medicine | Admitting: Internal Medicine

## 2021-08-02 ENCOUNTER — Other Ambulatory Visit: Payer: Self-pay

## 2021-08-02 VITALS — BP 120/70 | HR 64 | Wt 140.2 lb

## 2021-08-02 DIAGNOSIS — Z87891 Personal history of nicotine dependence: Secondary | ICD-10-CM | POA: Diagnosis not present

## 2021-08-02 DIAGNOSIS — Z7982 Long term (current) use of aspirin: Secondary | ICD-10-CM | POA: Insufficient documentation

## 2021-08-02 DIAGNOSIS — Z8249 Family history of ischemic heart disease and other diseases of the circulatory system: Secondary | ICD-10-CM | POA: Insufficient documentation

## 2021-08-02 DIAGNOSIS — R531 Weakness: Secondary | ICD-10-CM | POA: Insufficient documentation

## 2021-08-02 DIAGNOSIS — Z7901 Long term (current) use of anticoagulants: Secondary | ICD-10-CM | POA: Insufficient documentation

## 2021-08-02 DIAGNOSIS — I110 Hypertensive heart disease with heart failure: Secondary | ICD-10-CM | POA: Diagnosis not present

## 2021-08-02 DIAGNOSIS — M310 Hypersensitivity angiitis: Secondary | ICD-10-CM | POA: Insufficient documentation

## 2021-08-02 DIAGNOSIS — I2510 Atherosclerotic heart disease of native coronary artery without angina pectoris: Secondary | ICD-10-CM | POA: Diagnosis not present

## 2021-08-02 DIAGNOSIS — R140 Abdominal distension (gaseous): Secondary | ICD-10-CM | POA: Insufficient documentation

## 2021-08-02 DIAGNOSIS — J9610 Chronic respiratory failure, unspecified whether with hypoxia or hypercapnia: Secondary | ICD-10-CM | POA: Diagnosis not present

## 2021-08-02 DIAGNOSIS — J8489 Other specified interstitial pulmonary diseases: Secondary | ICD-10-CM | POA: Diagnosis not present

## 2021-08-02 DIAGNOSIS — Z8616 Personal history of COVID-19: Secondary | ICD-10-CM | POA: Insufficient documentation

## 2021-08-02 DIAGNOSIS — R188 Other ascites: Secondary | ICD-10-CM | POA: Insufficient documentation

## 2021-08-02 DIAGNOSIS — Z9049 Acquired absence of other specified parts of digestive tract: Secondary | ICD-10-CM | POA: Insufficient documentation

## 2021-08-02 DIAGNOSIS — Z79899 Other long term (current) drug therapy: Secondary | ICD-10-CM | POA: Diagnosis not present

## 2021-08-02 DIAGNOSIS — I482 Chronic atrial fibrillation, unspecified: Secondary | ICD-10-CM | POA: Insufficient documentation

## 2021-08-02 DIAGNOSIS — I4891 Unspecified atrial fibrillation: Secondary | ICD-10-CM

## 2021-08-02 DIAGNOSIS — I5032 Chronic diastolic (congestive) heart failure: Secondary | ICD-10-CM | POA: Diagnosis not present

## 2021-08-02 DIAGNOSIS — Z952 Presence of prosthetic heart valve: Secondary | ICD-10-CM | POA: Insufficient documentation

## 2021-08-02 DIAGNOSIS — Z880 Allergy status to penicillin: Secondary | ICD-10-CM | POA: Diagnosis not present

## 2021-08-02 DIAGNOSIS — I2721 Secondary pulmonary arterial hypertension: Secondary | ICD-10-CM | POA: Diagnosis not present

## 2021-08-02 DIAGNOSIS — Z9889 Other specified postprocedural states: Secondary | ICD-10-CM | POA: Insufficient documentation

## 2021-08-02 DIAGNOSIS — Z951 Presence of aortocoronary bypass graft: Secondary | ICD-10-CM | POA: Diagnosis not present

## 2021-08-02 DIAGNOSIS — K7460 Unspecified cirrhosis of liver: Secondary | ICD-10-CM | POA: Insufficient documentation

## 2021-08-02 DIAGNOSIS — Z888 Allergy status to other drugs, medicaments and biological substances status: Secondary | ICD-10-CM | POA: Diagnosis not present

## 2021-08-02 DIAGNOSIS — I350 Nonrheumatic aortic (valve) stenosis: Secondary | ICD-10-CM | POA: Diagnosis not present

## 2021-08-02 DIAGNOSIS — M791 Myalgia, unspecified site: Secondary | ICD-10-CM | POA: Insufficient documentation

## 2021-08-02 LAB — CBC
HCT: 30.1 % — ABNORMAL LOW (ref 39.0–52.0)
Hemoglobin: 9.4 g/dL — ABNORMAL LOW (ref 13.0–17.0)
MCH: 25.8 pg — ABNORMAL LOW (ref 26.0–34.0)
MCHC: 31.2 g/dL (ref 30.0–36.0)
MCV: 82.7 fL (ref 80.0–100.0)
Platelets: 231 10*3/uL (ref 150–400)
RBC: 3.64 MIL/uL — ABNORMAL LOW (ref 4.22–5.81)
RDW: 18.9 % — ABNORMAL HIGH (ref 11.5–15.5)
WBC: 6.6 10*3/uL (ref 4.0–10.5)
nRBC: 0 % (ref 0.0–0.2)

## 2021-08-02 LAB — BASIC METABOLIC PANEL
Anion gap: 10 (ref 5–15)
BUN: 21 mg/dL (ref 8–23)
CO2: 22 mmol/L (ref 22–32)
Calcium: 8.9 mg/dL (ref 8.9–10.3)
Chloride: 96 mmol/L — ABNORMAL LOW (ref 98–111)
Creatinine, Ser: 1.32 mg/dL — ABNORMAL HIGH (ref 0.61–1.24)
GFR, Estimated: 57 mL/min — ABNORMAL LOW (ref >60)
Glucose, Bld: 98 mg/dL (ref 70–99)
Potassium: 3.5 mmol/L (ref 3.5–5.1)
Sodium: 128 mmol/L — ABNORMAL LOW (ref 135–145)

## 2021-08-02 MED ORDER — POTASSIUM CHLORIDE CRYS ER 20 MEQ PO TBCR: EXTENDED_RELEASE_TABLET | ORAL | 6 refills | Status: DC

## 2021-08-02 NOTE — Addendum Note (Signed)
Encounter addended by: Scarlette Calico, RN on: 08/02/2021 3:32 PM  Actions taken: Order list changed

## 2021-08-02 NOTE — Patient Instructions (Signed)
Take an extra 2 tabs (40 meq) of Potassium today  Starting tomorrow take 5 tabs (100  meq) Daily, you can take these all together or 3 in AM and 2 in PM  Your physician recommends that you schedule a follow-up appointment in:  4 months  If you have any questions or concerns before your next appointment please send [place] a message through [place] or call our office at [phone].    TO LEAVE A MESSAGE FOR THE NURSE SELECT OPTION 2, PLEASE LEAVE A MESSAGE INCLUDING: YOUR NAME DATE OF BIRTH CALL BACK NUMBER REASON FOR CALL**this is important as we prioritize the call backs  YOU WILL RECEIVE A CALL BACK THE SAME DAY AS LONG AS YOU CALL BEFORE 4:00 PM  milAt the [facility], you and your health needs are our priority. As part of our continuing mission to provide you with exceptional heart care, we have created designated Provider Care Teams. These Care Teams include your primary Cardiologist (physician) and Advanced Practice Providers (APPs- Physician Assistants and Nurse Practitioners) who all work together to provide you with the care you need, when you need it.   You may see any of the following providers on your designated Care Team at your next follow up: Dr Glori Bickers Dr Loralie Champagne Dr Patrice Paradise, NP Lyda Jester, [place] Ginnie Smart Audry Riles, PharmD   Please be sure to bring in all your medications bottles to every appointment.

## 2021-08-02 NOTE — Progress Notes (Signed)
Advanced Heart Failure Clinic Note   Date:  08/02/2021   PCP:  [name], PA-C  Pulmonology: Dr. Lamonte Sakai Cardiologist:  Dr. Bettina Gavia  HF Cardiologist: Dr. 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 Also has COPD (quit smoking in 2002 - 1 ppd x 25 years), HL, and HTN.  In 8/18 admitted for acute gallstone pancreatitis. Underwent lap cholecystectomy on 07/29/2017. On 8/6 he developed hypotension and was taken back for open laparotomy due to bleeding from an unclear omental source. He was given fluids and 8 units PRBC perioperatively. He says his breathing has been bad since that time.   Seen at [facility] in 2018 had hi-res CT, PFTs and VQ (low prob). Felt to have Golds I COPD with asthma overlap and early pulmonary fibrosis with PAH. He was also seen by Cardiology. Echo 8/18 as below showed normal LV function with septal flattening and RVSP 65-17mHG. 14-day event monitor showed 10-beat run NSVT. Felt to have chronic AF and PAH. No further testing ordered.  Underwent RHC on 01/18/18 with severe PAH and cor pulmonale. Macitentan and Adcirca. Subsequently started on selexipag but dose limited by cramping now on 400/200  Admitted 5/19 with symptomatic hypotension and weight gain. He had a repeat RHC and echo with bubble study (results below). He was started on midodrine for BP support. Diuresed 25 lbs with IV lasix, then transitioned to torsemide 60 mg daily. Abdominal UKoreashowed cirrhosis with no evidence of portal HTN. Pulm consulted for possible ILD on High-res chest CT. ue to COVID and diarrhea. He recovered. He had stable NYHA II symptoms at follow up 4/22, repeat echo scheduled (see results below).  [facility], profound [facility]. [facility].  On doxy for 5 weeks.   Presents today for unscheduled visit. Says he just feels bad. Tired and weak. Feels bloated and crampy. Weight up 4-5 pounds. Taking torsemide 456mdaily alternating with 60 mg daily. Stopped metolazone several weeks ago.   Studies: - Echo (4/22): EF 50-55%, RV ok, severe RVSP 61.6 mmHg, severe biatrial enlargement, moderate TR, stable AVR, AV mean gradient 11 mmHg  - Echo (3/21): EF 45-50% AVR ok. Septal flattening RV dilated mild HK. Moderate TR RVSP 80 severe bilateral enlargement.   - RHC 05/14/18 showed Mild/Moderate PAH in setting of high output with no evidence of intracardiac shunting. Hemodynamics as below.    - High-Res Chest CT 05/16/18 1. Slightly irregular 0.9 cm peripheral right middle lobe solid pulmonary nodule. 3 month follow up recommended.  2. Dependent basilar predominant patchy subpleural reticulation and ground-glass attenuation with the suggestion of minimal associated traction bronchiolectasis. No frank honeycombing. Findings may indicate an interstitial lung disease such as early usual interstitial pneumonia (UIP) or fibrotic phase nonspecific interstitial pneumonia (NSIP). Suggest a follow-up high-resolution chest CT study in 6-12 months to assess temporal pattern stability, as clinically warranted. 3. Mild cardiomegaly. Minimal interlobular septal thickening and trace dependent bilateral pleural effusions.  4. Small to moderate volume perihepatic ascites. 5. Ectatic 4.3 cm ascending thoracic aorta. Recommend annual imaging followup by CTA or MRA.  6. Left main and 3 vessel coronary atherosclerosis.   - US[place]oreabdomen RUQ 05/16/18 - s/p cholecystectomy. + hepatic cirrhosis. Mild ascites.    - Liver Doppler 05/16/18 -  No hepatic, splenic, or portal venous thrombosis or occlusion. Mild ascites.   - Echo with bubble study 05/16/18 LVEF 55-60% with "late bubbles" , Mechanical AVR stable with trivial perivalvular regurg, Mild MR, Severe LAE,  Severe RV dilation and reduced function. No PFO. Mod TR, PA peak pressure 68 mm Hg   - RHC 05/14/18 RA = 18 RV = 71/17 PA = 69/24 (39) PCW = 20 Fick cardiac output/index = 7.0/3.7 Thermo CO/CI = 7.2/3.8 PVR = 2.6 WU Ao sat = 97% PA sat =  73%, 73% SVC sat = 73%  RA sat = 72%  - RHC 01/18/18 RA = 23 RV = 84/21 PA = 81/38 (53) PCW = 22 Fick cardiac output/index = 3.0/1.6 PVR = 10.4 WU FA sat = 98% on 2L  (checked on RA = 93%) PA sat = 60%, 61% SVC sat = 62%  -14 Day Patch 10/01/2017-10/15/2017: -1 run of Ventricular Tachycardia occurred lasting 10 beats with a max rate of 240 bpm (avg 161 bpm). -Atrial Flutter occurred continuously (100% burden), ranging from 55-132 bpm (avg of 87 bpm).   - NM Ventilation and Perfusion Lung Scan 10/31/2017:  Low probability of pulmonary embolism, using the PIOPED criteria.  - PFTs 07/02/2017:       FVC     86% FEV1   85% FEV1/FVC  73% DLCO  8.2, 32%   - 6MW -  1400 feet; 97% to 85% (recovered with resting)                                    CT Chest 07/02/2017: 1. Stable right middle lobe pulmonary nodule measuring up to 1 cm. 2. Minimal subpleural reticulation without evidence of honeycombing to  suggest pulmonary fibrosis. Stable centrilobular emphysema. 3. Mediastinal lymphadenopathy, increased from prior. 4. Calcified right hilar lymph nodes and multiple calcified granulomas  throughout the right lung consistent with prior granulomatous disease. 5. Ascending thoracic aortic aneurysm measuring up to 4.5 cm, unchanged. 6. Dilatation of the main pulmonary artery suggestive of pulmonary  hypertension. 7. Cardiomegaly and dense atherosclerotic coronary artery calcifications.   Review of systems complete and found to be negative unless listed in HPI.   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 In light of concurrent left nephrolithiasis, will check CT renal colic next year instead of renal US.    Anticoagulated on Coumadin 01/04/2018   Anxiety 05/10/2016   Last Assessment & Plan:  Doing well off of zoloft.   Asthma    Asymptomatic microscopic hematuria 06/20/2017   Last Assessment & Plan:  Had hematuria workup in [facility] in 2016 which negative CT and cystoscopy. UA with 2+ blood last visit - we discussed recommendation for repeat workup at 5 years or if degree of hematuria progresses.    Atrial fibrillation ([place]) 03/29/2017   Atrial flutter ([place]) 03/29/2017   Backache 12/14/2015   Last Assessment & Plan:  Pain management referral for further evaluation.   Bilateral pleural effusion 08/09/2017   CHF (congestive heart failure) (HCC)    Chronic allergic rhinitis 04/28/2016   Last Assessment & Plan:  Continue astelin   Chronic anticoagulation 03/29/2017   Chronic atrial fibrillation ([place]) 07/23/2015   Last Assessment &  Plan:  Coumadin and metoprolol, cardiology referral to establish care.   Chronic midline back pain 12/14/2015   Last Assessment & Plan:  Pain management referral for further evaluation.   Chronic obstructive lung disease ([place]) 06/12/2016   With hypoxia   Chronic prostatitis 07/23/2015   Last Assessment & Plan:  Has largely resolved since stopping bike riding. Recommend annual DRE AND PSA - will see back 12/2015 for annual screening, given 1st degree fhx. To call office for recurrent prostatitis symptoms.    Chronic respiratory failure with hypoxia ([place]) 07/29/2019   Cirrhosis of liver not due to alcohol ([place]) 07/01/2019   COPD (chronic obstructive pulmonary disease) ([place]) 06/12/2016   Coronary arteriosclerosis in native artery 03/29/2017   Coronary artery disease involving native coronary artery of native heart with angina pectoris ([place]) 03/29/2017   Cough 10/17/2016   Last Assessment & Plan:  Discussed typical course for acute viral  illness. If symptoms worsen or fail to improve by 7-10d, delayed ATBs, fluids, rest, NSAIDs/APAP prn. Seek care if not improving. Needs earlier INR check due to ATBs.   Dyspnea 02/01/2016   Last Assessment & Plan:  Overall improving, eval by pulm, plan for CT, neg stress test with cardiology. Recent switch to carvedilol due to side effects.   Encounter for therapeutic drug monitoring 01/06/2019   Enterococcal bacteremia    Epidermoid cyst of skin 08/24/2017   Essential hypertension 12/14/2015   Last Assessment & Plan:  Hypertension control: controlled  Medications: compliant Medication Management: as noted in orders Home blood pressure monitoring recommended additionally as needed for symptoms  The patient's care plan was reviewed and updated. Instructions and counseling were provided regarding patient goals and barriers. He was counseled to adopt a healthy lifestyle. Educational resources and self-management tools have been provided as charted in [facility] list.    H/O maze procedure 03/29/2017   [name] 03/29/2017   Overview:  2011   History of coronary artery bypass graft 03/29/2017   Hx of CABG 03/29/2017   Hyperlipidemia 03/29/2017   Hypersensitivity angiitis ([place]) 10/01/2017   Hypertensive heart disease 03/29/2017   Hypertensive heart disease with heart failure ([place]) 03/29/2017   Hypertensive heart failure ([place]) 03/29/2017   Hypokalemia due to excessive renal loss of potassium 02/18/2018   Hypotension, chronic 06/06/2018   International normalized ratio (INR) raised 07/26/2017   Kidney stone 07/23/2015   Kidney stones 07/23/2015   Overview:  x 3  Last Assessment & Plan:  By [place] has left nephrolithiasis, but not visible by KUB. Will check CT renal colic next year to assess both stone burden as well as to surveil AML.    Left ureteral stone 01/23/2018   Leukocytoclastic vasculitis ([place]) 10/01/2017   Localized edema 01/04/2018   Long term (current) use of anticoagulants 03/29/2017   Lumbar  radicular pain 01/19/2016   Lumbar radiculopathy 01/19/2016   Maculopapular rash 09/03/2017   Microscopic hematuria 06/20/2017   Last Assessment & Plan:  Had hematuria workup in [facility] in 2016 which negative CT and cystoscopy. UA with 2+ blood last visit - we discussed recommendation for repeat workup at 5 years or if degree of hematuria progresses.    Multiple nodules of lung 06/12/2016   Nasal discharge 02/25/2016   Last Assessment & Plan:  Trial zyrtec and flonase   Nephrolithiasis 07/23/2015   Overview:  x 3  Last Assessment & Plan:  By [place] has left nephrolithiasis, but not visible by KUB. Will check CT renal colic next  year to assess both stone burden as well as to surveil AML.   Overview:  x 3  Last Assessment & Plan:  Has 32m nonobstructing LUP stone - not visible by KUB.  Will check renal UKorea8/2019 - he will contact office sooner if symptomatic.    Non-sustained ventricular tachycardia ([place] 03/29/2017   Nonsustained ventricular tachycardia ([place] 03/29/2017   Other hyperlipidemia 03/29/2017   Palpitations 10/01/2017   Pleural effusion, bilateral 08/09/2017   Pneumonia due to COVID-19 virus 02/07/2021   Post-nasal drainage 02/25/2016   Last Assessment & Plan:  Trial zyrtec and flonase   Prostate cancer screening 06/20/2017   Last Assessment & Plan:  Recommend continued annual CaP screening until within 10 years of life expectancy. Given good health and fhx of longevity, would anticipate CaP screening to continue until age 75  PSA today and again in one year on day of visit.   Pulmonary arterial hypertension ([place] 02/20/2018   Pulmonary edema    Pulmonary hypertension ([place] 08/09/2017   Pulmonary nodules 06/12/2016   S/P AVR 03/20/2016   S/P AVR (aortic valve replacement) 03/20/2016   Strain of deltoid muscle, initial encounter    Supratherapeutic INR 07/26/2017   Syncope and collapse 02/01/2016   Typical atrial flutter ([place] 02/01/2016   Past Surgical History:  Procedure Laterality Date   CHOLECYSTECTOMY      CORONARY ARTERY BYPASS GRAFT     EXPLORATORY LAPAROTOMY  07/30/2017   FOOT SURGERY     FRACTURE SURGERY Right    wrist and forearm   HERNIA REPAIR     MECHANICAL AORTIC VALVE REPLACEMENT     NASAL SINUS SURGERY     RIGHT HEART CATH N/A 01/18/2018   Procedure: RIGHT HEART CATH;  Surgeon: BJolaine Artist MD;  [facility];  Service: Cardiovascular;  Laterality: N/A;   RIGHT HEART CATH N/A 05/14/2018   Procedure: RIGHT HEART CATH;  Surgeon: BJolaine Artist MD;  [facility];  Service: Cardiovascular;  Laterality: N/A;   TEE WITHOUT CARDIOVERSION N/A 05/21/2018   Procedure: TRANSESOPHAGEAL ECHOCARDIOGRAM (TEE);  Surgeon: BJolaine Artist MD;  [facility];  Service: Cardiovascular;  Laterality: N/A;   UPPER GASTROINTESTINAL ENDOSCOPY  07/12/2017   Patchy areas of mucosal inflammation noted in the antrum with edema,erthema and ulcerations. Bx. Chronicfocally active gastritis.   VASECTOMY     Current Medications: Current Meds  Medication Sig   albuterol (VENTOLIN HFA) 108 (90 Base) MCG/ACT inhaler Inhale 2 puffs into the lungs every 6 (six) hours as needed for wheezing or shortness of breath.   aspirin EC 81 MG tablet Take 81 mg by mouth daily.   B Complex-C (B-COMPLEX WITH VITAMIN C) tablet Take 1 tablet by mouth daily. Unknown strength   cetirizine (ZYRTEC) 10 MG tablet Take 10 mg by mouth daily.   Cholecalciferol (VITAMIN D) 125 MCG (5000 UT) CAPS Take 1 capsule by mouth daily.   Coenzyme Q10 (COQ10 PO) Take 1 capsule by mouth daily.   Cyanocobalamin (VITAMIN B-12 CR PO) Take 1 tablet by mouth daily.   KRILL OIL PO Take 1 capsule by mouth daily.   Magnesium Gluconate (MAGNESIUM 27 PO) Take 1 tablet by mouth every other day.    metolazone (ZAROXOLYN) 2.5 MG tablet TAKE 1 TABLET(2.5 MG) BY MOUTH DAILY AS NEEDED FOR FLUID RETENTION   midodrine (PROAMATINE) 10 MG tablet Take 1 tablet (10 mg total) by mouth 3 (three) times daily with meals.    ondansetron ([facility]  ODT) 4 MG disintegrating tablet Take 1 tablet (4 mg total) by mouth every 8 (eight) hours as needed for nausea or vomiting.   OPSUMIT 10 MG tablet TAKE 1 TABLET DAILY   potassium chloride SA (KLOR-CON) 20 MEQ tablet Take 2 tablets (40 mEq total) by mouth 2 (two) times daily.   rosuvastatin (CRESTOR) 5 MG tablet TAKE 1 TABLET(5 MG) BY MOUTH DAILY   Selexipag (UPTRAVI) 400 MCG TABS Take 1 tablet by mouth daily. 200 mcg in the evening   spironolactone (ALDACTONE) 25 MG tablet TAKE 1/2 TABLET BY MOUTH EVERY DAY   tadalafil, PAH, (ADCIRCA) 20 MG tablet Take 40 mg by mouth daily.   torsemide (DEMADEX) 20 MG tablet Take 60 mg daily alternating with 40 mg daily   warfarin (COUMADIN) 5 MG tablet TAKE 1/2 TO 1 TABLET BY MOUTH DAILY AS DIRECTED BY COUMADIN CLINIC    Allergies:   Amoxicillin-pot clavulanate, Pantoprazole sodium, Tape, and Wound dressing adhesive   Social History   Socioeconomic History   Marital status: Single    Spouse name: Not on file   Number of children: Not on file   Years of education: Not on file   Highest education level: Not on file  Occupational History   Not on file  Tobacco Use   Smoking status: Former    Packs/day: 2.00    Years: 34.00    Pack years: 68.00    Types: Cigarettes    Quit date: 07/16/2000    Years since quitting: 21.0   Smokeless tobacco: Never  Vaping Use   Vaping Use: Never used  Substance and Sexual Activity   Alcohol use: Not Currently   Drug use: No   Sexual activity: Not on file  Other Topics Concern   Not on file  Social History Narrative   Not on file   Social Determinants of Health   Financial Resource Strain: Not on file  Food Insecurity: Not on file  Transportation Needs: Not on file  Physical Activity: Not on file  Stress: Not on file  Social Connections: Not on file    Family History: The patient's family history includes Arthritis in his mother; Asthma in his mother; Heart attack in his father;  Hypertension in his father; Stroke in his paternal grandmother. There is no history of Colon cancer.  Recent Labs: 06/17/2021: Brain Natriuretic Peptide 508 07/04/2021: ALT 24; Pro B Natriuretic peptide (BNP) 425.0 07/22/2021: BUN 28; Creatinine, Ser 1.30; Potassium 3.8; Sodium 131 08/02/2021: Hemoglobin 9.4; Platelets 231  Recent Lipid Panel    Component Value Date/Time   CHOL 133 10/12/2020 1104   TRIG 111 10/12/2020 1104   HDL 44 10/12/2020 1104   CHOLHDL 3.0 10/12/2020 1104   VLDL 11 10/21/2018 1028   LDLCALC 70 10/12/2020 1104   BP 120/70   Pulse 64   Wt 63.6 kg (140 lb 3.2 oz)   SpO2 99%   BMI 20.70 kg/m     Wt Readings from Last 3 Encounters:  08/02/21 63.6 kg (140 lb 3.2 oz)  07/27/21 62.6 kg (138 lb)  07/22/21 63.2 kg (139 lb 6.4 oz)   Physical Exam:  General:  Anxious. No resp difficulty HEENT: normal Neck: supple. JVP9-10 Carotids 2+ bilat; no bruits. No lymphadenopathy or thryomegaly appreciated. Cor: PMI nondisplaced. Irregular rate & rhythm. Mechnanical s2 Lungs: decreased throguhout Abdomen: soft, nontender, nondistended. No hepatosplenomegaly. No bruits or masses. Good bowel sounds. Extremities: no cyanosis, clubbing, rash, tr edema Neuro: alert & orientedx3, cranial nerves grossly intact.  moves all 4 extremities w/o difficulty. Affect pleasant   Assessment/Plan  1. Pulmonary hypertension with cor pulmonale/RV failure - WHO Group I  - ? Component of HHT/shunt/AVMs with late bubbles on bubble study.  - Echo with bubble study (05/16/18): EF 55-60% with "late bubbles" , Mechanical AVR stable with trivial perivalvular regurg, Mild MR, Severe LAE, Severe RV dilation and reduced function. No PFO. Mod TR, PA peak pressure 68 mm Hg - Echo (10/19): EF 55-60% mild LVH; s/p AVR with mean gradient 11 mmHg and trace AI; mild MR; severe biatrial enlargement; mild RVE; severe TR; severe pulmonary hypertension. RVSP 29mHG - Echo (4/22): EF 50-55%, RV ok, severe RVSP 61.6  mmHg, severe biatrial enlargement, moderate TR, stable AVR, AV mean gradient 11 mmHg - RHC (1/19): with mod/severe pulm HTN with RV failure.   - RHC (05/14/18) with Mild/Moderate PAH in setting of high output with no evidence of intracardiac shunting. - Ab u/s with cirrhosis but no evidence of portal HTN. - Improved with Macitentan and Adcirca. Uptravi dose at 400/200 limited by cramping   - Worse today with NYHA III symptoms but volume status looks ok ReDS 36% (previously 41%) - Continue torsemide 60 mg daily alternating with 40 mg daily. Potassium low on labs today so k-dur increased to 5 tabs daily - Continue spiro 12.5 mg daily. - Off losartan due to low BP - Continue midodrine 15 mg bid (it was 10 tid but he says it works better this way). Discussed taking 15 mg tid if needed for low BP, especially in evenings. - Auto-immune serologies negative (checked twice). - Overall volume status stable. Suspect significant component of anxiety today. I have reached out to his PCP to get f/u to discuss  2. Chronic respiratory failure - [place] CT 05/16/18 with "dependent basilar predominant patchy subpleural reticulation and ground-glass attenuation with the suggestion of minimal associated traction bronchiolectasis. No frank honeycombing. Findings may indicate an interstitial lung disease such as early usual interstitial pneumonia (UIP) or fibrotic phase nonspecific interstitial pneumonia (NSIP)." - Followed by Dr. BLamonte Sakai - No change   3. Chronic AFL - Rate controlled.  Now off metoprolol.  - Continue coumadin with mechanical AVR. - [place] [facility] [place]   4. CAD - s/p CABG 07/2017 with Dr [place]oderic [place]  - No s/s ischemia - Continue Crestor 5 mg daily (unable to tolerate atorva due to myalgias).  - Lipids followed by Dr. MBettina Gavia   5. S/p mechanical AVR - Stable on echo 4/22. - Continue coumadin/ASA 81. INR goal is 2.5-3 per Dr MBettina Gavia - Aware of need  for SBE prophylaxis   6. Leukocytoclastic vasculitis - R/u with Rheumatology.  - No changes.   7. Cirrhosis - UKoreaAbdomen RUQ 05/16/18 - s/p cholecystectomy. + hepatic cirrhosis. Mild ascites.  - Liver Doppler 05/16/18 - No hepatic, splenic, or portal venous thrombosis or occlusion. Mild ascites. - Continue midodrine 15 mg bid (it was 10 tid but he says it works better this way)    DGlori Bickers MD  08/02/21

## 2021-08-02 NOTE — Addendum Note (Signed)
Encounter addended by: Scarlette Calico, RN on: 08/02/2021 3:30 PM  Actions taken: Visit diagnoses modified, Order list changed, Diagnosis association updated

## 2021-08-02 NOTE — Progress Notes (Signed)
ReDS Vest / Clip - 08/02/21 1600       ReDS Vest / Clip   Station Marker C    Ruler Value 29    ReDS Value Range Moderate volume overload    ReDS Actual Value 36

## 2021-08-03 ENCOUNTER — Telehealth: Payer: Self-pay

## 2021-08-03 MED ORDER — CLONAZEPAM 0.5 MG PO TABS: 0.5000 mg | ORAL_TABLET | Freq: Two times a day (BID) | ORAL | 0 refills | Status: DC | PRN

## 2021-08-03 NOTE — Telephone Encounter (Addendum)
Pt calling to get rx for anxiety and muscle tightness/stiffness for pt. He stated that Dr. Haroldine Laws, Shaune Pascal, MD had sent a message to [facility] about sending a rx for pt since he lost a certification to send some meds.    Please advise and send in rx to pt pharmacy on file [facility] in [place] off [address]   Aletha Halim send 10 tab rx of clonazepam to pt pharmacy. Have him schedule with me this week or in about 10 days. He has recent anemia/labs sent to me from cardiologist office. This needs to be followed up/worked up.

## 2021-08-03 NOTE — Telephone Encounter (Signed)
Called pt and relayed the message below.

## 2021-08-04 ENCOUNTER — Encounter: Payer: Self-pay | Admitting: Emergency Medicine

## 2021-08-04 ENCOUNTER — Other Ambulatory Visit: Payer: Self-pay

## 2021-08-04 ENCOUNTER — Ambulatory Visit (INDEPENDENT_AMBULATORY_CARE_PROVIDER_SITE_OTHER): Payer: Medicare Other | Admitting: Emergency Medicine

## 2021-08-04 DIAGNOSIS — J849 Interstitial pulmonary disease, unspecified: Secondary | ICD-10-CM

## 2021-08-04 DIAGNOSIS — J449 Chronic obstructive pulmonary disease, unspecified: Secondary | ICD-10-CM

## 2021-08-04 DIAGNOSIS — M542 Cervicalgia: Secondary | ICD-10-CM | POA: Diagnosis not present

## 2021-08-04 DIAGNOSIS — I25119 Atherosclerotic heart disease of native coronary artery with unspecified angina pectoris: Secondary | ICD-10-CM

## 2021-08-04 DIAGNOSIS — M25511 Pain in right shoulder: Secondary | ICD-10-CM | POA: Diagnosis not present

## 2021-08-04 MED ORDER — ALBUTEROL SULFATE HFA 108 (90 BASE) MCG/ACT IN AERS: 2.0000 | INHALATION_SPRAY | Freq: Four times a day (QID) | RESPIRATORY_TRACT | 6 refills | Status: DC | PRN

## 2021-08-04 NOTE — Assessment & Plan Note (Signed)
Stable PFT.  Plan to continue albuterol as needed.  Not on scheduled BD therapy at this time.

## 2021-08-04 NOTE — Patient Instructions (Signed)
Pulmonary function testing and CT scan of the chest in May were stable.  This is good news. Please continue your albuterol 2 puffs when you needed for shortness of breath, chest tightness, wheezing.  He may want to try pretreating exercise Agree with working on your exercise and conditioning.  This will help her functional capacity. Continue your cardiac medications as you have been taking them. Follow with Dr. Lamonte Sakai in 12 months or sooner if you have any problems.

## 2021-08-04 NOTE — Assessment & Plan Note (Signed)
Overall stable compared with priors on CT scan from May, fortunately no evidence of progressive interstitial change following his COVID-19 pneumonia.  Continue to follow clinically and radiographically

## 2021-08-04 NOTE — Progress Notes (Signed)
Subjective:    Patient ID: Lance Hicks, male    DOB: [birth year], 75 y.o.   MRN: [id number]  HPI  ROV 09/20/20 --75 year old gentleman with a history of COPD and superimposed restrictive lung disease, secondary pulmonary hypertension due to this as well as left-sided heart failure (A. fib, CAD, aortic stenosis, CABG and AVR, hypertension).  Associated hypoxemia.  He has been managed on tadalafil, selexipag, macitentan, diuretics, Coumadin.  He is able to work out 5 days a week without SOB. He has chest tightness and some SOB when there are weather changes. Doesn't have an albuterol inhaler, never uses O2 even though he has a POC at home.   He underwent a repeat CT scan of the chest on 09/17/2020 which I have reviewed to follow pulmonary nodular disease in particular a noncalcified right middle lobe subpleural nodule.  Measured at 10 x 5 mm on this scan, stable.  Overall the scan is stable going back to 05/16/2018. He is not currently on any bronchodilator therapy.  His pulmonary function testing from 01/21/18 showed mixed restriction and obstruction with a diffusion defect.   ROV 08/04/21 --Lance Hicks returns for follow-up.  He is 75 with a history of COPD and restrictive lung disease due to chronic (stable by imaging) interstitial lung disease.  He has multifactorial secondary PAH in the setting of this as well as significant left-sided heart factors (CAD/CABG, AS/AVR, atrial fibrillation, hypertension, chronic hypoxemia).  Has been managed on diuretics, tadalafil, selexipag, macitentan.  We had also been following pulmonary nodules by CT chest, stable for 2 years and a benign. He had COVID-19 in 2/22 with pulmonary infiltrates on chest x-ray in March and May.  This prompted high-resolution CT scan of the chest 05/21/2021 as below, PFT 05/24/2021. Then in June-July hospitalized w sepsis syndrome, ? RMSF.   Over the last 2 days has had some increased SOB, 20 min every morning. Then he feels better and  resumes his usual activity. Using albuterol 1-2x a day, usually rarely.    CT chest high-resolution 05/21/2021 reviewed by me shows persistent patchy peripheral predominant groundglass with some septal thickening and a craniocaudal gradient similar to 09/17/2020 on.  No traction bronchiectasis or honeycomb change  PFT reviewed by me from 05/24/2021 showed principally restriction on spirometry without a bronchodilator response, restricted lung volumes and a decreased diffusion capacity that does not correct when adjusted for alveolar volume.  FEV1 is stable compared with 01/21/2018  Review of Systems  Constitutional:  Negative for fever and unexpected weight change.  HENT:  Negative for congestion, dental problem, ear pain, nosebleeds, postnasal drip, rhinorrhea, sinus pressure, sneezing, sore throat and trouble swallowing.   Eyes:  Negative for redness and itching.  Respiratory:  Positive for shortness of breath. Negative for cough, chest tightness and wheezing.   Cardiovascular:  Negative for palpitations and leg swelling.  Gastrointestinal:  Negative for nausea and vomiting.  Genitourinary:  Negative for dysuria.  Musculoskeletal:  Negative for joint swelling.  Skin:  Negative for rash.  Neurological:  Negative for headaches.  Hematological:  Does not bruise/bleed easily.  Psychiatric/Behavioral:  Negative for dysphoric mood. The patient is not nervous/anxious.   was a Higher education careers adviser, has lived in [place], [place], [place] Was in [place], was in [place], no other inhaled exposures.  Has been exposed to paint fumes in the past     Objective:   Physical Exam Vitals:   08/04/21 1446  BP: 122/68  Pulse: (!) 55  Temp: (!) 97.5 F (36.4  C)  TempSrc: Oral  SpO2: 98%  Weight: 141 lb 12.8 oz (64.3 kg)  Height: 5' 9.5" (1.765 m)   Gen: Pleasant, well-nourished, in no distress,  normal affect  ENT: No lesions,  mouth clear,  oropharynx clear, no postnasal drip  Neck: No JVD, no stridor  Lungs: No use of  accessory muscles, no wheeze or crackles  Cardiovascular: RRR, soft syst M, click  Musculoskeletal: No deformities, no cyanosis or clubbing  Neuro: alert, non focal  Skin: Warm, no lesions or rash    Assessment & Plan:  ILD (interstitial lung disease) (HCC) Overall stable compared with priors on CT scan from May, fortunately no evidence of progressive interstitial change following his COVID-19 pneumonia.  Continue to follow clinically and radiographically  COPD (chronic obstructive pulmonary disease) (HCC) Stable PFT.  Plan to continue albuterol as needed.  Not on scheduled BD therapy at this time.  Baltazar Apo, MD, PhD 08/04/2021, 5:35 PM [facility] [phone] or if no answer [phone]

## 2021-08-10 ENCOUNTER — Other Ambulatory Visit: Payer: Self-pay

## 2021-08-10 DIAGNOSIS — I10 Essential (primary) hypertension: Secondary | ICD-10-CM

## 2021-08-10 DIAGNOSIS — I482 Chronic atrial fibrillation, unspecified: Secondary | ICD-10-CM

## 2021-08-16 ENCOUNTER — Other Ambulatory Visit: Payer: Self-pay

## 2021-08-16 ENCOUNTER — Ambulatory Visit (INDEPENDENT_AMBULATORY_CARE_PROVIDER_SITE_OTHER): Payer: Medicare Other | Admitting: Medical

## 2021-08-16 ENCOUNTER — Encounter: Payer: Self-pay | Admitting: Medical

## 2021-08-16 VITALS — BP 126/68 | HR 50 | Resp 18 | Ht 69.5 in | Wt 141.0 lb

## 2021-08-16 DIAGNOSIS — R748 Abnormal levels of other serum enzymes: Secondary | ICD-10-CM

## 2021-08-16 DIAGNOSIS — J029 Acute pharyngitis, unspecified: Secondary | ICD-10-CM | POA: Diagnosis not present

## 2021-08-16 DIAGNOSIS — K7460 Unspecified cirrhosis of liver: Secondary | ICD-10-CM

## 2021-08-16 DIAGNOSIS — R5383 Other fatigue: Secondary | ICD-10-CM | POA: Diagnosis not present

## 2021-08-16 DIAGNOSIS — I509 Heart failure, unspecified: Secondary | ICD-10-CM

## 2021-08-16 DIAGNOSIS — I4891 Unspecified atrial fibrillation: Secondary | ICD-10-CM

## 2021-08-16 DIAGNOSIS — E559 Vitamin D deficiency, unspecified: Secondary | ICD-10-CM

## 2021-08-16 DIAGNOSIS — M542 Cervicalgia: Secondary | ICD-10-CM | POA: Diagnosis not present

## 2021-08-16 DIAGNOSIS — E611 Iron deficiency: Secondary | ICD-10-CM

## 2021-08-16 DIAGNOSIS — E538 Deficiency of other specified B group vitamins: Secondary | ICD-10-CM | POA: Diagnosis not present

## 2021-08-16 DIAGNOSIS — L509 Urticaria, unspecified: Secondary | ICD-10-CM | POA: Diagnosis not present

## 2021-08-16 DIAGNOSIS — I25119 Atherosclerotic heart disease of native coronary artery with unspecified angina pectoris: Secondary | ICD-10-CM

## 2021-08-16 LAB — POCT RAPID STREP A (OFFICE): Rapid Strep A Screen: NEGATIVE

## 2021-08-16 MED ORDER — NYSTATIN 100000 UNIT/ML MT SUSP: 5.0000 mL | Freq: Four times a day (QID) | OROMUCOSAL | 0 refills | Status: DC

## 2021-08-16 MED ORDER — DIAZEPAM 5 MG PO TABS: 5.0000 mg | ORAL_TABLET | Freq: Two times a day (BID) | ORAL | 0 refills | Status: DC | PRN

## 2021-08-16 NOTE — Progress Notes (Signed)
Subjective:    Patient ID: Lance Hicks, male    DOB: 1946-04-09, 75 y.o.   MRN: [id number]  HPI  Pt in for some neck pain. He can hear crepitus when he turns his neck.  Has noticed this for years. He wonders if he might need an mri.   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 In past expresses exercise helped with his anxiety. Also describes in past working with horses seemed to help.  Pt states he is not eating much.   Pt has chf and he is compliant on recent diuretics.  Some daily shortness of breath. No chest pain. He expresses aware of hydrating adequatley but not overhydrating. Pt has seen Dr. Haroldine Laws on 08-02-2021  Hx of cirrhosis of liver. Recent itching of left upper arm. No sob or wheezing.   Very fatigued. Hx of low iron, low b12, low vit d per pt.   Pt mentions some pain swallowing recently. When eating recently will make him cough and he will bring up small peaces of food he is earing.   Review of Systems  Constitutional:  Positive for fatigue. Negative for chills and fever.  Respiratory:  Positive for shortness of breath. Negative for cough, chest tightness and wheezing.   Cardiovascular:  Negative for chest pain and palpitations.  Gastrointestinal:  Negative for abdominal pain and diarrhea.  Endocrine: Negative for polydipsia, polyphagia and polyuria.  Genitourinary:  Negative for dysuria and flank pain.  Musculoskeletal:  Negative for back pain and gait problem.  Skin:  Positive for rash.  Neurological:  Negative for dizziness, speech difficulty, weakness, numbness and headaches.  Hematological:  Negative for adenopathy. Does not bruise/bleed easily.  Psychiatric/Behavioral:  Negative for behavioral problems, confusion, self-injury and suicidal ideas. The patient is not nervous/anxious.    Past Medical History:  Diagnosis Date   Allergic rhinitis 04/28/2016   Last Assessment & Plan:  Continue astelin   Anemia 07/01/2019   Angiomyolipoma of  right kidney 05/03/2016   Last Assessment & Plan:  Stable in size on annual imaging. In light of concurrent left nephrolithiasis, will check CT renal colic next year instead of renal US.    Anticoagulated on Coumadin 01/04/2018   Anxiety 05/10/2016   Last Assessment & Plan:  Doing well off of zoloft.   Asthma    Asymptomatic microscopic hematuria 06/20/2017   Last Assessment & Plan:  Had hematuria workup in [facility] in 2016 which negative CT and cystoscopy. UA with 2+ blood last visit - we discussed recommendation for repeat workup at 5 years or if degree of hematuria progresses.    Atrial fibrillation ([place]) 03/29/2017   Atrial flutter ([place]) 03/29/2017   Backache 12/14/2015   Last Assessment & Plan:  Pain management referral for further evaluation.   Bilateral pleural effusion 08/09/2017   CHF (congestive heart failure) (HCC)    Chronic allergic rhinitis 04/28/2016   Last Assessment & Plan:  Continue astelin   Chronic anticoagulation 03/29/2017   Chronic atrial fibrillation ([place]) 07/23/2015   Last Assessment & Plan:  Coumadin and metoprolol, cardiology referral to establish care.   Chronic midline back pain 12/14/2015   Last Assessment & Plan:  Pain management referral for further evaluation.   Chronic obstructive lung disease ([place]) 06/12/2016   With hypoxia   Chronic prostatitis 07/23/2015   Last Assessment & Plan:  Has largely resolved since stopping bike riding. Recommend annual DRE AND PSA - will see back 12/2015 for annual screening,  given 1st degree fhx. To call office for recurrent prostatitis symptoms.    Chronic respiratory failure with hypoxia ([place]) 07/29/2019   Cirrhosis of liver not due to alcohol ([place]) 07/01/2019   COPD (chronic obstructive pulmonary disease) ([place]) 06/12/2016   Coronary arteriosclerosis in native artery 03/29/2017   Coronary artery disease involving native coronary artery of native heart with angina pectoris ([place]) 03/29/2017   Cough 10/17/2016   Last Assessment & Plan:  Discussed typical  course for acute viral illness. If symptoms worsen or fail to improve by 7-10d, delayed ATBs, fluids, rest, NSAIDs/APAP prn. Seek care if not improving. Needs earlier INR check due to ATBs.   Dyspnea 02/01/2016   Last Assessment & Plan:  Overall improving, eval by pulm, plan for CT, neg stress test with cardiology. Recent switch to carvedilol due to side effects.   Encounter for therapeutic drug monitoring 01/06/2019   Enterococcal bacteremia    Epidermoid cyst of skin 08/24/2017   Essential hypertension 12/14/2015   Last Assessment & Plan:  Hypertension control: controlled  Medications: compliant Medication Management: as noted in orders Home blood pressure monitoring recommended additionally as needed for symptoms  The patient's care plan was reviewed and updated. Instructions and counseling were provided regarding patient goals and barriers. He was counseled to adopt a healthy lifestyle. Educational resources and self-management tools have been provided as charted in [facility] list.    H/O maze procedure 03/29/2017   [name] 03/29/2017   Overview:  2011   History of coronary artery bypass graft 03/29/2017   Hx of CABG 03/29/2017   Hyperlipidemia 03/29/2017   Hypersensitivity angiitis ([place]) 10/01/2017   Hypertensive heart disease 03/29/2017   Hypertensive heart disease with heart failure ([place]) 03/29/2017   Hypertensive heart failure ([place]) 03/29/2017   Hypokalemia due to excessive renal loss of potassium 02/18/2018   Hypotension, chronic 06/06/2018   International normalized ratio (INR) raised 07/26/2017   Kidney stone 07/23/2015   Kidney stones 07/23/2015   Overview:  x 3  Last Assessment & Plan:  By [place] has left nephrolithiasis, but not visible by KUB. Will check CT renal colic next year to assess both stone burden as well as to surveil AML.    Left ureteral stone 01/23/2018   Leukocytoclastic vasculitis ([place]) 10/01/2017   Localized edema 01/04/2018   Long term (current) use of anticoagulants  03/29/2017   Lumbar radicular pain 01/19/2016   Lumbar radiculopathy 01/19/2016   Maculopapular rash 09/03/2017   Microscopic hematuria 06/20/2017   Last Assessment & Plan:  Had hematuria workup in [facility] in 2016 which negative CT and cystoscopy. UA with 2+ blood last visit - we discussed recommendation for repeat workup at 5 years or if degree of hematuria progresses.    Multiple nodules of lung 06/12/2016   Nasal discharge 02/25/2016   Last Assessment & Plan:  Trial zyrtec and flonase   Nephrolithiasis 07/23/2015   Overview:  x 3  Last Assessment & Plan:  By [place] has left nephrolithiasis, but not visible by KUB. Will check CT renal colic next year to assess both stone burden as well as to surveil AML.   Overview:  x 3  Last Assessment & Plan:  Has 81m nonobstructing LUP stone - not visible by KUB.  Will check renal [place] - he will contact office sooner if symptomatic.    Non-sustained ventricular tachycardia ([place] 03/29/2017   Nonsustained ventricular tachycardia ([place] 03/29/2017   Other hyperlipidemia 03/29/2017   Palpitations 10/01/2017  Pleural effusion, bilateral 08/09/2017   Pneumonia due to COVID-19 virus 02/07/2021   Post-nasal drainage 02/25/2016   Last Assessment & Plan:  Trial zyrtec and flonase   Prostate cancer screening 06/20/2017   Last Assessment & Plan:  Recommend continued annual CaP screening until within 10 years of life expectancy. Given good health and fhx of longevity, would anticipate CaP screening to continue until age 42.  PSA today and again in one year on day of visit.   Pulmonary arterial hypertension ([place]) 02/20/2018   Pulmonary edema    Pulmonary hypertension ([place]) 08/09/2017   Pulmonary nodules 06/12/2016   S/P AVR 03/20/2016   S/P AVR (aortic valve replacement) 03/20/2016   Strain of deltoid muscle, initial encounter    Supratherapeutic INR 07/26/2017   Syncope and collapse 02/01/2016   Typical atrial flutter ([place]) 02/01/2016     Social History   Socioeconomic History   Marital  status: Single    Spouse name: Not on file   Number of children: Not on file   Years of education: Not on file   Highest education level: Not on file  Occupational History   Not on file  Tobacco Use   Smoking status: Former    Packs/day: 2.00    Years: 34.00    Pack years: 68.00    Types: Cigarettes    Quit date: 07/16/2000    Years since quitting: 21.0   Smokeless tobacco: Never  Vaping Use   Vaping Use: Never used  Substance and Sexual Activity   Alcohol use: Not Currently   Drug use: No   Sexual activity: Not on file  Other Topics Concern   Not on file  Social History Narrative   Not on file   Social Determinants of Health   Financial Resource Strain: Not on file  Food Insecurity: Not on file  Transportation Needs: Not on file  Physical Activity: Not on file  Stress: Not on file  Social Connections: Not on file  Intimate Partner Violence: Not on file    Past Surgical History:  Procedure Laterality Date   CHOLECYSTECTOMY     CORONARY ARTERY BYPASS GRAFT     EXPLORATORY LAPAROTOMY  07/30/2017   FOOT SURGERY     FRACTURE SURGERY Right    wrist and forearm   HERNIA REPAIR     MECHANICAL AORTIC VALVE REPLACEMENT     NASAL SINUS SURGERY     RIGHT HEART CATH N/A 01/18/2018   Procedure: RIGHT HEART CATH;  Surgeon: Jolaine Artist, MD;  Location: [facility];  Service: Cardiovascular;  Laterality: N/A;   RIGHT HEART CATH N/A 05/14/2018   Procedure: RIGHT HEART CATH;  Surgeon: Jolaine Artist, MD;  Location: [facility];  Service: Cardiovascular;  Laterality: N/A;   TEE WITHOUT CARDIOVERSION N/A 05/21/2018   Procedure: TRANSESOPHAGEAL ECHOCARDIOGRAM (TEE);  Surgeon: Jolaine Artist, MD;  Location: [facility] ENDOSCOPY;  Service: Cardiovascular;  Laterality: N/A;   UPPER GASTROINTESTINAL ENDOSCOPY  07/12/2017   Patchy areas of mucosal inflammation noted in the antrum with edema,erthema and ulcerations. Bx. Chronicfocally active gastritis.   VASECTOMY       Family History  Problem Relation Age of Onset   Asthma Mother    Arthritis Mother    Heart attack Father    Hypertension Father    Stroke Paternal Grandmother    Colon cancer Neg Hx     Allergies  Allergen Reactions   Amoxicillin-Pot Clavulanate Diarrhea and Anaphylaxis    Stomach  pain Has patient had a PCN reaction causing immediate rash, facial/tongue/throat swelling, SOB or lightheadedness with hypotension: No Has patient had a PCN reaction causing severe rash involving mucus membranes or skin necrosis: No Has patient had a PCN reaction that required hospitalization: No Has patient had a PCN reaction occurring within the last 10 years: Yes If all of the above answers are "NO", then may proceed with Cephalosporin use.  Stomach pain Has patient had a PCN reaction causing immediate rash, facial/tongue/throat swelling, SOB or lightheadedness with hypotension: No Has patient had a PCN reaction causing severe rash involving mucus membranes or skin necrosis: No Has patient had a PCN reaction that required hospitalization: No Has patient had a PCN reaction occurring within the last 10 years: Yes If all of the above answers are "NO", then may proceed with Cephalosporin use. n/v   Pantoprazole Sodium Nausea Only    Gets gassy and starting itching like crazy Gets gassy and starting itching like crazy   Tape Rash and Other (See Comments)    Surgical tape   Wound Dressing Adhesive Other (See Comments) and Rash    Surgical tape Surgical tape Surgical tape    Current Outpatient Medications on File Prior to Visit  Medication Sig Dispense Refill   albuterol (VENTOLIN HFA) 108 (90 Base) MCG/ACT inhaler Inhale 2 puffs into the lungs every 6 (six) hours as needed for wheezing or shortness of breath. 8 g 6   aspirin EC 81 MG tablet Take 81 mg by mouth daily.     B Complex-C (B-COMPLEX WITH VITAMIN C) tablet Take 1 tablet by mouth daily. Unknown strength     cetirizine (ZYRTEC) 10 MG  tablet Take 10 mg by mouth daily.     Cholecalciferol (VITAMIN D) 125 MCG (5000 UT) CAPS Take 1 capsule by mouth daily.     clonazePAM (KLONOPIN) 0.5 MG tablet Take 1 tablet (0.5 mg total) by mouth 2 (two) times daily as needed for anxiety. 10 tablet 0   Coenzyme Q10 (COQ10 PO) Take 1 capsule by mouth daily.     Cyanocobalamin (VITAMIN B-12 CR PO) Take 1 tablet by mouth daily.     KRILL OIL PO Take 1 capsule by mouth daily.     losartan (COZAAR) 50 MG tablet Take 50 mg by mouth daily.     Magnesium Gluconate (MAGNESIUM 27 PO) Take 1 tablet by mouth every other day.      metolazone (ZAROXOLYN) 2.5 MG tablet TAKE 1 TABLET(2.5 MG) BY MOUTH DAILY AS NEEDED FOR FLUID RETENTION 20 tablet 0   midodrine (PROAMATINE) 10 MG tablet Take 1 tablet (10 mg total) by mouth 3 (three) times daily with meals. 90 tablet 11   ondansetron (ZOFRAN ODT) 4 MG disintegrating tablet Take 1 tablet (4 mg total) by mouth every 8 (eight) hours as needed for nausea or vomiting. 20 tablet 0   OPSUMIT 10 MG tablet TAKE 1 TABLET DAILY 30 tablet 11   potassium chloride SA (KLOR-CON) 20 MEQ tablet Take 3 tablets (60 mEq total) by mouth in the morning AND 2 tablets (40 mEq total) every evening. 150 tablet 6   rosuvastatin (CRESTOR) 5 MG tablet TAKE 1 TABLET(5 MG) BY MOUTH DAILY 90 tablet 3   Selexipag (UPTRAVI) 400 MCG TABS Take 1 tablet by mouth daily. 200 mcg in the evening     spironolactone (ALDACTONE) 25 MG tablet TAKE 1/2 TABLET BY MOUTH EVERY DAY 45 tablet 3   tadalafil, PAH, (ADCIRCA) 20 MG tablet Take 40  mg by mouth daily.     torsemide (DEMADEX) 20 MG tablet Take 60 mg daily alternating with 40 mg daily 270 tablet 3   warfarin (COUMADIN) 5 MG tablet TAKE 1/2 TO 1 TABLET BY MOUTH DAILY AS DIRECTED BY COUMADIN CLINIC 30 tablet 5   No current facility-administered medications on file prior to visit.    BP 126/68   Pulse (!) 50   Resp 18   Ht 5' 9.5" (1.765 m)   Wt 141 lb (64 kg)   SpO2 92%   BMI 20.52 kg/m         Objective:   Physical Exam  General Mental Status- Alert. General Appearance- Not in acute distress.   Skin General: Color- Normal Color. Moisture- Normal Moisture.  Neck Carotid Arteries- Normal color. Moisture- Normal Moisture. No carotid bruits. No JVD. On range of motion and palpation. Obvious crepitus.  Chest and Lung Exam Auscultation: Breath Sounds:-Normal.  Cardiovascular Auscultation:Rythm- Regular. Murmurs & Other Heart Sounds:Auscultation of the heart reveals- No Murmurs.  Abdomen Inspection:-Inspeection Normal. Palpation/Percussion:Note:No mass. Palpation and Percussion of the abdomen reveal- Non Tender, Non Distended + BS, no rebound or guarding.   Neurologic Cranial Nerve exam:- CN III-XII intact(No nystagmus), symmetric smile. Drift Test:- No drift. Romberg Exam:- Negative.  Heal to Toe Gait exam:-Normal. Finger to Nose:- Normal/Intact Strength:- 5/5 equal and symmetric strength both upper and lower extremities.   Lower ext- no pedal edema.   Heent- beefy red appearance to posterior pharynx.    Assessment & Plan:   History of CHF and stable clinically presently on exam.  But since he does report chronic daily dyspnea in the past and lives far distance from our office we will go ahead and check a BNP and chest and chest x-ray.  Also assess metabolic panel/potassium level.  Continue on current treatment regimen prescribed by cardiologist.  History of cirrhosis of the liver.  From recent itching.  We will follow liver enzymes and get ammonia level.  After review of labs might add on hydroxyzine.  Recent pharyngitis/sore throat.  On exam he does have beefy red appearance to throat.  Based on appearance was considering Diflucan but interacts with warfarin.  Decided to prescribe nystatin solution.  Treat for fungal infection while waiting for rapid strep and send out culture.  Neck pain recently with crepitus on exam.  We will get cervical spine x-ray.  After  x-ray review consider referral to specialist.  May benefit from MRI?[name]  Recent increase in anxiety.  He does note trapezius muscles are tight as well.  Decided to DC his clonazepam and prescribed Valium.  Rx advisement given.  For fatigue also added CBC, iron level, TSH, T4, B12 and vitamin D.  Follow-up date to be determined after lab review.  [name], PA-C  Time spent with patient today was  46 minutes which consisted of chart review, discussing diagnosis, work up, treatment and documentation.

## 2021-08-16 NOTE — Patient Instructions (Signed)
History of CHF and stable clinically presently on exam.  But since he does report chronic daily dyspnea in the past and lives far distance from our office we will go ahead and check a BNP and chest and chest x-ray.  Also assess metabolic panel/potassium level.  Continue on current treatment regimen prescribed by cardiologist.  History of cirrhosis of the liver.  From recent itching.  We will follow liver enzymes and get ammonia level.  After review of labs might add on hydroxyzine.  Recent pharyngitis/sore throat.  On exam he does have beefy red appearance to throat.  Based on appearance was considering Diflucan but interacts with warfarin.  Decided to prescribe nystatin solution.  Treat for fungal infection while waiting for rapid strep and send out culture.  Neck pain recently with crepitus on exam.  We will get cervical spine x-ray.  After x-ray review consider referral to specialist.  May benefit from MRI?[name]  Recent increase in anxiety.  He does note trapezius muscles are tight as well.  Decided to DC his clonazepam and prescribed Valium.  Rx advisement given.  For fatigue also added CBC, iron level, TSH, T4, B12 and vitamin D.  Follow-up date to be determined after lab review.

## 2021-08-17 ENCOUNTER — Ambulatory Visit (HOSPITAL_BASED_OUTPATIENT_CLINIC_OR_DEPARTMENT_OTHER)
Admission: RE | Admit: 2021-08-17 | Discharge: 2021-08-17 | Disposition: A | Discharge status: Home / Self Care | Payer: Medicare Other | Source: Ambulatory Visit | Attending: Medical | Admitting: Medical

## 2021-08-17 ENCOUNTER — Other Ambulatory Visit (INDEPENDENT_AMBULATORY_CARE_PROVIDER_SITE_OTHER): Payer: Medicare Other

## 2021-08-17 DIAGNOSIS — K7460 Unspecified cirrhosis of liver: Secondary | ICD-10-CM | POA: Diagnosis not present

## 2021-08-17 DIAGNOSIS — R5383 Other fatigue: Secondary | ICD-10-CM | POA: Diagnosis not present

## 2021-08-17 DIAGNOSIS — L509 Urticaria, unspecified: Secondary | ICD-10-CM | POA: Diagnosis not present

## 2021-08-17 DIAGNOSIS — I509 Heart failure, unspecified: Secondary | ICD-10-CM | POA: Diagnosis not present

## 2021-08-17 DIAGNOSIS — J811 Chronic pulmonary edema: Secondary | ICD-10-CM | POA: Diagnosis not present

## 2021-08-17 DIAGNOSIS — I517 Cardiomegaly: Secondary | ICD-10-CM | POA: Diagnosis not present

## 2021-08-17 DIAGNOSIS — M542 Cervicalgia: Secondary | ICD-10-CM | POA: Diagnosis not present

## 2021-08-17 LAB — TSH: TSH: 2.71 u[IU]/mL (ref 0.35–5.50)

## 2021-08-17 LAB — CBC WITH DIFFERENTIAL/PLATELET
Basophils Absolute: 0.1 10*3/uL (ref 0.0–0.1)
Basophils Relative: 0.9 % (ref 0.0–3.0)
Eosinophils Absolute: 0.1 10*3/uL (ref 0.0–0.7)
Eosinophils Relative: 1 % (ref 0.0–5.0)
HCT: 29 % — ABNORMAL LOW (ref 39.0–52.0)
Hemoglobin: 9.4 g/dL — ABNORMAL LOW (ref 13.0–17.0)
Lymphocytes Relative: 10.1 % — ABNORMAL LOW (ref 12.0–46.0)
Lymphs Abs: 0.6 10*3/uL — ABNORMAL LOW (ref 0.7–4.0)
MCHC: 32.3 g/dL (ref 30.0–36.0)
MCV: 77.4 fl — ABNORMAL LOW (ref 78.0–100.0)
Monocytes Absolute: 0.9 10*3/uL (ref 0.1–1.0)
Monocytes Relative: 14.5 % — ABNORMAL HIGH (ref 3.0–12.0)
Neutro Abs: 4.5 10*3/uL (ref 1.4–7.7)
Neutrophils Relative %: 73.5 % (ref 43.0–77.0)
Platelets: 210 10*3/uL (ref 150.0–400.0)
RBC: 3.75 Mil/uL — ABNORMAL LOW (ref 4.22–5.81)
RDW: 20.5 % — ABNORMAL HIGH (ref 11.5–15.5)
WBC: 6.1 10*3/uL (ref 4.0–10.5)

## 2021-08-17 LAB — COMPREHENSIVE METABOLIC PANEL
ALT: 12 U/L (ref 0–53)
AST: 28 U/L (ref 0–37)
Albumin: 4.3 g/dL (ref 3.5–5.2)
Alkaline Phosphatase: 65 U/L (ref 39–117)
BUN: 35 mg/dL — ABNORMAL HIGH (ref 6–23)
CO2: 22 mEq/L (ref 19–32)
Calcium: 10 mg/dL (ref 8.4–10.5)
Chloride: 88 mEq/L — ABNORMAL LOW (ref 96–112)
Creatinine, Ser: 1.59 mg/dL — ABNORMAL HIGH (ref 0.40–1.50)
GFR: 42.53 mL/min — ABNORMAL LOW (ref >60.00)
Glucose, Bld: 93 mg/dL (ref 70–99)
Potassium: 3.4 mEq/L — ABNORMAL LOW (ref 3.5–5.1)
Sodium: 130 mEq/L — ABNORMAL LOW (ref 135–145)
Total Bilirubin: 2 mg/dL — ABNORMAL HIGH (ref 0.2–1.2)
Total Protein: 8.2 g/dL (ref 6.0–8.3)

## 2021-08-17 LAB — T4, FREE: Free T4: 1.27 ng/dL (ref 0.60–1.60)

## 2021-08-17 LAB — AMMONIA: Ammonia: 32 umol/L (ref 11–35)

## 2021-08-17 LAB — VITAMIN B12: Vitamin B-12: >1550 pg/mL — ABNORMAL HIGH (ref 211–911)

## 2021-08-17 LAB — BRAIN NATRIURETIC PEPTIDE: Pro B Natriuretic peptide (BNP): 601 pg/mL — ABNORMAL HIGH (ref 0.0–100.0)

## 2021-08-17 LAB — IRON: Iron: 19 ug/dL — ABNORMAL LOW (ref 42–165)

## 2021-08-17 MED ORDER — IRON (FERROUS SULFATE) 325 (65 FE) MG PO TABS
325.0000 mg | ORAL_TABLET | Freq: Every day | ORAL | 0 refills | Status: DC
Start: 2021-08-17 — End: 2021-09-27

## 2021-08-17 NOTE — Addendum Note (Signed)
Addended by: Kelle Darting A on: 08/17/2021 11:49 AM   Modules accepted: Orders

## 2021-08-17 NOTE — Addendum Note (Signed)
Addended by: Anabel Halon on: 08/17/2021 06:09 PM   Modules accepted: Orders

## 2021-08-17 NOTE — Progress Notes (Signed)
Pt here for redraw. No charge today

## 2021-08-17 NOTE — Addendum Note (Signed)
Addended by: Anabel Halon on: 08/17/2021 06:07 PM   Modules accepted: Orders

## 2021-08-18 ENCOUNTER — Telehealth: Payer: Self-pay | Admitting: Medical

## 2021-08-18 ENCOUNTER — Telehealth (HOSPITAL_COMMUNITY): Payer: Self-pay | Admitting: Pharmacy Technician

## 2021-08-18 LAB — CULTURE, GROUP A STREP
MICRO NUMBER:: 12279585
SPECIMEN QUALITY:: ADEQUATE

## 2021-08-18 NOTE — Telephone Encounter (Signed)
Pt has been taking diazepam (VALIUM) 5 MG tablet  for 2 days and has been experinceing side effects of headaches and stomach pain. He wants to know if something else can be tried.

## 2021-08-18 NOTE — Telephone Encounter (Signed)
Please advise.

## 2021-08-18 NOTE — Telephone Encounter (Signed)
Advanced Heart Failure Patient Advocate Encounter  Patient was approved to receive Opsumit and Uptravi from [facility] through the remainder of the year.  Patient ID: [id number] Effective dates: 08/16/21 through 12/24/21  Opsumit will come from [facility] and Malvin Johns will come from [place]. Patient is aware.  Charlann Boxer, CPhT

## 2021-08-19 ENCOUNTER — Telehealth: Payer: Self-pay | Admitting: Medical

## 2021-08-19 LAB — VITAMIN D 1,25 DIHYDROXY
Vitamin D 1, 25 (OH)2 Total: 29 pg/mL (ref 18–72)
Vitamin D2 1, 25 (OH)2: <8 pg/mL
Vitamin D3 1, 25 (OH)2: 29 pg/mL

## 2021-08-19 MED ORDER — BUSPIRONE HCL 7.5 MG PO TABS: 7.5000 mg | ORAL_TABLET | Freq: Two times a day (BID) | ORAL | 0 refills | Status: DC

## 2021-08-19 NOTE — Telephone Encounter (Signed)
Pt aware and voices understanding. -He says she does not take Buspar currently but would like to try it. He has already stopped the Valium and the sxs have resolved. Should we list this on his "Allergy list?" Follow up appointment has been scheduled.

## 2021-08-19 NOTE — Telephone Encounter (Signed)
Rx buspar sent to pt pharmacy.

## 2021-08-23 ENCOUNTER — Other Ambulatory Visit: Payer: Self-pay

## 2021-08-23 ENCOUNTER — Ambulatory Visit (INDEPENDENT_AMBULATORY_CARE_PROVIDER_SITE_OTHER): Payer: Medicare Other | Admitting: Medical

## 2021-08-23 ENCOUNTER — Ambulatory Visit (INDEPENDENT_AMBULATORY_CARE_PROVIDER_SITE_OTHER): Payer: Medicare Other

## 2021-08-23 VITALS — BP 112/60 | HR 66 | Temp 98.0°F | Resp 18 | Ht 70.0 in | Wt 141.0 lb

## 2021-08-23 DIAGNOSIS — K921 Melena: Secondary | ICD-10-CM

## 2021-08-23 DIAGNOSIS — I482 Chronic atrial fibrillation, unspecified: Secondary | ICD-10-CM

## 2021-08-23 DIAGNOSIS — R748 Abnormal levels of other serum enzymes: Secondary | ICD-10-CM | POA: Diagnosis not present

## 2021-08-23 DIAGNOSIS — E611 Iron deficiency: Secondary | ICD-10-CM

## 2021-08-23 DIAGNOSIS — D649 Anemia, unspecified: Secondary | ICD-10-CM

## 2021-08-23 DIAGNOSIS — Z7901 Long term (current) use of anticoagulants: Secondary | ICD-10-CM

## 2021-08-23 DIAGNOSIS — I25119 Atherosclerotic heart disease of native coronary artery with unspecified angina pectoris: Secondary | ICD-10-CM | POA: Diagnosis not present

## 2021-08-23 DIAGNOSIS — R5383 Other fatigue: Secondary | ICD-10-CM

## 2021-08-23 DIAGNOSIS — M542 Cervicalgia: Secondary | ICD-10-CM | POA: Diagnosis not present

## 2021-08-23 LAB — POCT INR: INR: 2.2 (ref 2.0–3.0)

## 2021-08-23 NOTE — Progress Notes (Signed)
Subjective:    Patient ID: Lance Hicks, male    DOB: 30-Aug-1946, 75 y.o.   MRN: [id number]  HPI  Pt in for follow up.  Pt states he is presently dong well. He had valium rx for anxiety. Pt had some ha, dizziness and stomach ache. He stopped and symptoms resolved. Lesser side effects with clonazepam. With more energy and less fatigue he has less anxiety  Pt seeing cardiologist tomorrow. Pt has hx of chf. Most recent cxr.  IMPRESSION: 1. Mild interstitial pulmonary edema. Bnp was 601.   Pt has anemia and iron level is low. Pt recently started back on iron tab daily.    Pt neck pain recently seems less as his stress level decreased.     Review of Systems  Constitutional:  Negative for chills, fatigue and fever.       Improved energy.   HENT:  Negative for dental problem.   Respiratory:  Negative for choking, chest tightness, shortness of breath and wheezing.   Cardiovascular:  Negative for chest pain and palpitations.  Gastrointestinal:  Negative for abdominal pain.  Genitourinary:  Negative for dysuria and flank pain.  Musculoskeletal:  Negative for back pain, myalgias and neck pain.  Skin:  Negative for rash.  Neurological:  Negative for dizziness, syncope, weakness, numbness and headaches.    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 In light of concurrent left nephrolithiasis, will check CT renal colic next year instead of renal US.    Anticoagulated on Coumadin 01/04/2018   Anxiety 05/10/2016   Last Assessment & Plan:  Doing well off of zoloft.   Asthma    Asymptomatic microscopic hematuria 06/20/2017   Last Assessment & Plan:  Had hematuria workup in [facility] in 2016 which negative CT and cystoscopy. UA with 2+ blood last visit - we discussed recommendation for repeat workup at  5 years or if degree of hematuria progresses.    Atrial fibrillation ([place]) 03/29/2017   Atrial flutter ([place]) 03/29/2017   Backache 12/14/2015   Last Assessment & Plan:  Pain management referral for further evaluation.   Bilateral pleural effusion 08/09/2017   CHF (congestive heart failure) (HCC)    Chronic allergic rhinitis 04/28/2016   Last Assessment & Plan:  Continue astelin   Chronic anticoagulation 03/29/2017   Chronic atrial fibrillation ([place]) 07/23/2015   Last Assessment & Plan:  Coumadin and metoprolol, cardiology referral to establish care.   Chronic midline back pain 12/14/2015   Last Assessment & Plan:  Pain management referral for further evaluation.   Chronic obstructive lung disease ([place]) 06/12/2016   With hypoxia   Chronic prostatitis 07/23/2015   Last Assessment & Plan:  Has largely resolved since stopping bike riding. Recommend annual DRE AND PSA - will see back 12/2015 for annual screening, given 1st degree fhx. To call office for recurrent prostatitis symptoms.    Chronic respiratory failure with hypoxia ([place]) 07/29/2019   Cirrhosis of liver not due to alcohol ([place]) 07/01/2019   COPD (chronic obstructive pulmonary disease) ([place]) 06/12/2016   Coronary arteriosclerosis in native artery 03/29/2017   Coronary artery disease involving native coronary artery of native heart with angina pectoris ([place]) 03/29/2017   Cough 10/17/2016   Last Assessment & Plan:  Discussed typical course for acute viral illness.  If symptoms worsen or fail to improve by 7-10d, delayed ATBs, fluids, rest, NSAIDs/APAP prn. Seek care if not improving. Needs earlier INR check due to ATBs.   Dyspnea 02/01/2016   Last Assessment & Plan:  Overall improving, eval by pulm, plan for CT, neg stress test with cardiology. Recent switch to carvedilol due to side effects.   Encounter for therapeutic drug monitoring 01/06/2019   Enterococcal bacteremia    Epidermoid cyst of skin 08/24/2017   Essential hypertension 12/14/2015   Last  Assessment & Plan:  Hypertension control: controlled  Medications: compliant Medication Management: as noted in orders Home blood pressure monitoring recommended additionally as needed for symptoms  The patient's care plan was reviewed and updated. Instructions and counseling were provided regarding patient goals and barriers. He was counseled to adopt a healthy lifestyle. Educational resources and self-management tools have been provided as charted in [facility] list.    H/O maze procedure 03/29/2017   [name] 03/29/2017   Overview:  2011   History of coronary artery bypass graft 03/29/2017   Hx of CABG 03/29/2017   Hyperlipidemia 03/29/2017   Hypersensitivity angiitis ([place]) 10/01/2017   Hypertensive heart disease 03/29/2017   Hypertensive heart disease with heart failure ([place]) 03/29/2017   Hypertensive heart failure ([place]) 03/29/2017   Hypokalemia due to excessive renal loss of potassium 02/18/2018   Hypotension, chronic 06/06/2018   International normalized ratio (INR) raised 07/26/2017   Kidney stone 07/23/2015   Kidney stones 07/23/2015   Overview:  x 3  Last Assessment & Plan:  By [place] has left nephrolithiasis, but not visible by KUB. Will check CT renal colic next year to assess both stone burden as well as to surveil AML.    Left ureteral stone 01/23/2018   Leukocytoclastic vasculitis ([place]) 10/01/2017   Localized edema 01/04/2018   Long term (current) use of anticoagulants 03/29/2017   Lumbar radicular pain 01/19/2016   Lumbar radiculopathy 01/19/2016   Maculopapular rash 09/03/2017   Microscopic hematuria 06/20/2017   Last Assessment & Plan:  Had hematuria workup in [facility] in [facility] which negative CT and cystoscopy. UA with 2+ blood last visit - we discussed recommendation for repeat workup at 5 years or if degree of hematuria progresses.    Multiple nodules of lung 06/12/2016   Nasal discharge 02/25/2016   Last Assessment & Plan:  Trial zyrtec and flonase   Nephrolithiasis 07/23/2015   Overview:   x 3  Last Assessment & Plan:  By [place] has left nephrolithiasis, but not visible by KUB. Will check CT renal colic next year to assess both stone burden as well as to surveil AML.   Overview:  x 3  Last Assessment & Plan:  Has 45m nonobstructing LUP stone - not visible by KUB.  Will check renal [place]2019 - he will contact office sooner if symptomatic.    Non-sustained ventricular tachycardia ([place] 03/29/2017   Nonsustained ventricular tachycardia ([place] 03/29/2017   Other hyperlipidemia 03/29/2017   Palpitations 10/01/2017   Pleural effusion, bilateral 08/09/2017   Pneumonia due to COVID-19 virus 02/07/2021   Post-nasal drainage 02/25/2016   Last Assessment & Plan:  Trial zyrtec and flonase   Prostate cancer screening 06/20/2017   Last Assessment & Plan:  Recommend continued annual CaP screening until within 10 years of life expectancy. Given good health and fhx of longevity, would anticipate CaP screening to continue until age 75  PSA today and again in one year on day of visit.   Pulmonary  arterial hypertension ([place]) 02/20/2018   Pulmonary edema    Pulmonary hypertension ([place]) 08/09/2017   Pulmonary nodules 06/12/2016   S/P AVR 03/20/2016   S/P AVR (aortic valve replacement) 03/20/2016   Strain of deltoid muscle, initial encounter    Supratherapeutic INR 07/26/2017   Syncope and collapse 02/01/2016   Typical atrial flutter ([place]) 02/01/2016     Social History   Socioeconomic History   Marital status: Single    Spouse name: Not on file   Number of children: Not on file   Years of education: Not on file   Highest education level: Not on file  Occupational History   Not on file  Tobacco Use   Smoking status: Former    Packs/day: 2.00    Years: 34.00    Pack years: 68.00    Types: Cigarettes    Quit date: 07/16/2000    Years since quitting: 21.1   Smokeless tobacco: Never  Vaping Use   Vaping Use: Never used  Substance and Sexual Activity   Alcohol use: Not Currently   Drug use: No   Sexual  activity: Not on file  Other Topics Concern   Not on file  Social History Narrative   Not on file   Social Determinants of Health   Financial Resource Strain: Not on file  Food Insecurity: Not on file  Transportation Needs: Not on file  Physical Activity: Not on file  Stress: Not on file  Social Connections: Not on file  Intimate Partner Violence: Not on file    Past Surgical History:  Procedure Laterality Date   CHOLECYSTECTOMY     CORONARY ARTERY BYPASS GRAFT     EXPLORATORY LAPAROTOMY  07/30/2017   FOOT SURGERY     FRACTURE SURGERY Right    wrist and forearm   HERNIA REPAIR     MECHANICAL AORTIC VALVE REPLACEMENT     NASAL SINUS SURGERY     RIGHT HEART CATH N/A 01/18/2018   Procedure: RIGHT HEART CATH;  Surgeon: Jolaine Artist, MD;  Location: [facility];  Service: Cardiovascular;  Laterality: N/A;   RIGHT HEART CATH N/A 05/14/2018   Procedure: RIGHT HEART CATH;  Surgeon: Jolaine Artist, MD;  Location: [facility];  Service: Cardiovascular;  Laterality: N/A;   TEE WITHOUT CARDIOVERSION N/A 05/21/2018   Procedure: TRANSESOPHAGEAL ECHOCARDIOGRAM (TEE);  Surgeon: Jolaine Artist, MD;  Location: [facility];  Service: Cardiovascular;  Laterality: N/A;   UPPER GASTROINTESTINAL ENDOSCOPY  07/12/2017   Patchy areas of mucosal inflammation noted in the antrum with edema,erthema and ulcerations. Bx. Chronicfocally active gastritis.   VASECTOMY      Family History  Problem Relation Age of Onset   Asthma Mother    Arthritis Mother    Heart attack Father    Hypertension Father    Stroke Paternal Grandmother    Colon cancer Neg Hx     Allergies  Allergen Reactions   Amoxicillin-Pot Clavulanate Diarrhea and Anaphylaxis    Stomach pain Has patient had a PCN reaction causing immediate rash, facial/tongue/throat swelling, SOB or lightheadedness with hypotension: No Has patient had a PCN reaction causing severe rash involving mucus membranes or skin  necrosis: No Has patient had a PCN reaction that required hospitalization: No Has patient had a PCN reaction occurring within the last 10 years: Yes If all of the above answers are "NO", then may proceed with Cephalosporin use.  Stomach pain Has patient had a PCN reaction causing immediate rash, facial/tongue/throat  swelling, SOB or lightheadedness with hypotension: No Has patient had a PCN reaction causing severe rash involving mucus membranes or skin necrosis: No Has patient had a PCN reaction that required hospitalization: No Has patient had a PCN reaction occurring within the last 10 years: Yes If all of the above answers are "NO", then may proceed with Cephalosporin use. n/v   Pantoprazole Sodium Nausea Only    Gets gassy and starting itching like crazy Gets gassy and starting itching like crazy   Valium [Diazepam] Other (See Comments)    HA and Abd pain.   Tape Rash and Other (See Comments)    Surgical tape   Wound Dressing Adhesive Other (See Comments) and Rash    Surgical tape Surgical tape Surgical tape    Current Outpatient Medications on File Prior to Visit  Medication Sig Dispense Refill   albuterol (VENTOLIN HFA) 108 (90 Base) MCG/ACT inhaler Inhale 2 puffs into the lungs every 6 (six) hours as needed for wheezing or shortness of breath. 8 g 6   aspirin EC 81 MG tablet Take 81 mg by mouth daily.     B Complex-C (B-COMPLEX WITH VITAMIN C) tablet Take 1 tablet by mouth daily. Unknown strength     busPIRone (BUSPAR) 7.5 MG tablet Take 1 tablet (7.5 mg total) by mouth 2 (two) times daily. 60 tablet 0   cetirizine (ZYRTEC) 10 MG tablet Take 10 mg by mouth daily.     Cholecalciferol (VITAMIN D) 125 MCG (5000 UT) CAPS Take 1 capsule by mouth daily.     Coenzyme Q10 (COQ10 PO) Take 1 capsule by mouth daily.     Cyanocobalamin (VITAMIN B-12 CR PO) Take 1 tablet by mouth daily.     Iron, Ferrous Sulfate, 325 (65 Fe) MG TABS Take 325 mg by mouth daily. 60 tablet 0   KRILL OIL PO  Take 1 capsule by mouth daily.     losartan (COZAAR) 50 MG tablet Take 50 mg by mouth daily.     Magnesium Gluconate (MAGNESIUM 27 PO) Take 1 tablet by mouth every other day.      metolazone (ZAROXOLYN) 2.5 MG tablet TAKE 1 TABLET(2.5 MG) BY MOUTH DAILY AS NEEDED FOR FLUID RETENTION 20 tablet 0   midodrine (PROAMATINE) 10 MG tablet Take 1 tablet (10 mg total) by mouth 3 (three) times daily with meals. 90 tablet 11   nystatin (MYCOSTATIN) 100000 UNIT/ML suspension Take 5 mLs (500,000 Units total) by mouth 4 (four) times daily. 60 mL 0   ondansetron (ZOFRAN ODT) 4 MG disintegrating tablet Take 1 tablet (4 mg total) by mouth every 8 (eight) hours as needed for nausea or vomiting. 20 tablet 0   OPSUMIT 10 MG tablet TAKE 1 TABLET DAILY 30 tablet 11   potassium chloride SA (KLOR-CON) 20 MEQ tablet Take 3 tablets (60 mEq total) by mouth in the morning AND 2 tablets (40 mEq total) every evening. 150 tablet 6   rosuvastatin (CRESTOR) 5 MG tablet TAKE 1 TABLET(5 MG) BY MOUTH DAILY 90 tablet 3   Selexipag (UPTRAVI) 400 MCG TABS Take 1 tablet by mouth daily. 200 mcg in the evening     spironolactone (ALDACTONE) 25 MG tablet TAKE 1/2 TABLET BY MOUTH EVERY DAY 45 tablet 3   tadalafil, PAH, (ADCIRCA) 20 MG tablet Take 40 mg by mouth daily.     torsemide (DEMADEX) 20 MG tablet Take 60 mg daily alternating with 40 mg daily 270 tablet 3   warfarin (COUMADIN) 5 MG tablet TAKE  1/2 TO 1 TABLET BY MOUTH DAILY AS DIRECTED BY COUMADIN CLINIC 30 tablet 5   No current facility-administered medications on file prior to visit.    BP 112/60 (BP Location: Left Arm, Patient Position: Sitting, Cuff Size: Normal)   Pulse 66   Temp 98 F (36.7 C) (Oral)   Resp 18   Ht _0  (1.778 m)   Wt 141 lb (64 kg)   SpO2 96%   BMI 20.23 kg/m       Objective:   Physical Exam  General- No acute distress. Pleasant patient. Neck- Full range of motion, no jvd Lungs- Clear, even and unlabored. Heart- regular rate and  rhythm. Neurologic- CNII- XII grossly intact.  Lower ext- no obvious pedal edema.     Assessment & Plan:   Your neck pain is improved since you have less stress. You have c spine xray copy.  Show your PT the report as therapy maneuvers may be changed.  For elevated liver enzymes and cirrhosis continue to avoid alcohol. Follow liver enzymes on labs.  For anemia and low iron get cbc today and turn in ifob card. If + likely get you back in with Dr. Lyndel Safe.  Of PH continue current meds cardiologist prescribed.  For chf see Dr. Bettina Gavia tomorrow.  For anxiety buspar and hydroxyzine in future if needed. I don't think it is definite that you would have same reaction with buspar.   Follow up 3-4 weeks or sooner if needed.  [name], PA-C

## 2021-08-23 NOTE — Patient Instructions (Signed)
Take 1 tablet tonight only and then Continue taking 1/2 tablet daily except 1 tablet on Monday, Wednesday and Friday..  Please call our office with any medication changes or concerns [phone]. Return in 4 weeks for INR check.

## 2021-08-23 NOTE — Patient Instructions (Addendum)
Your neck pain is improved since you have less stress. You have c spine xray copy.  Show your PT the report as therapy maneuvers may be changed.  For elevated liver enzymes and cirrhosis continue to avoid alcohol. Follow liver enzymes on labs.  For anemia and low iron get cbc today and turn in ifob card. If + likely get you back in with Dr. Lyndel Safe.  Of PH continue current meds cardiologist prescribed.  For chf see Dr. Bettina Gavia tomorrow.  For anxiety buspar and hydroxyzine in future if needed. I don't think it is definite that you would have same reaction with buspar.   Follow up 3-4 weeks or sooner if needed.

## 2021-08-24 ENCOUNTER — Ambulatory Visit (INDEPENDENT_AMBULATORY_CARE_PROVIDER_SITE_OTHER): Payer: Medicare Other | Admitting: Cardiology

## 2021-08-24 ENCOUNTER — Encounter: Payer: Self-pay | Admitting: Cardiology

## 2021-08-24 ENCOUNTER — Other Ambulatory Visit (INDEPENDENT_AMBULATORY_CARE_PROVIDER_SITE_OTHER): Payer: Medicare Other

## 2021-08-24 VITALS — BP 120/66 | HR 62 | Ht 70.0 in | Wt 143.0 lb

## 2021-08-24 DIAGNOSIS — I9589 Other hypotension: Secondary | ICD-10-CM | POA: Diagnosis not present

## 2021-08-24 DIAGNOSIS — Z7901 Long term (current) use of anticoagulants: Secondary | ICD-10-CM | POA: Diagnosis not present

## 2021-08-24 DIAGNOSIS — Z952 Presence of prosthetic heart valve: Secondary | ICD-10-CM | POA: Diagnosis not present

## 2021-08-24 DIAGNOSIS — I25119 Atherosclerotic heart disease of native coronary artery with unspecified angina pectoris: Secondary | ICD-10-CM

## 2021-08-24 DIAGNOSIS — I4892 Unspecified atrial flutter: Secondary | ICD-10-CM | POA: Diagnosis not present

## 2021-08-24 DIAGNOSIS — I482 Chronic atrial fibrillation, unspecified: Secondary | ICD-10-CM

## 2021-08-24 DIAGNOSIS — R5383 Other fatigue: Secondary | ICD-10-CM

## 2021-08-24 DIAGNOSIS — I5032 Chronic diastolic (congestive) heart failure: Secondary | ICD-10-CM

## 2021-08-24 DIAGNOSIS — E7849 Other hyperlipidemia: Secondary | ICD-10-CM

## 2021-08-24 DIAGNOSIS — I2721 Secondary pulmonary arterial hypertension: Secondary | ICD-10-CM | POA: Diagnosis not present

## 2021-08-24 NOTE — Addendum Note (Signed)
Addended by: Kelle Darting A on: 08/24/2021 02:05 PM   Modules accepted: Orders

## 2021-08-24 NOTE — Patient Instructions (Addendum)
Medication Instructions:  Your physician recommends that you continue on your current medications as directed. Please refer to the Current Medication list given to you today.  Please take some over the counter Quinine for your cramps.  *If you need a refill on your cardiac medications before your next appointment, please call your pharmacy*   Lab Work: None If you have labs (blood work) drawn today and your tests are completely normal, you will receive your results only by: [place] (if you have MyChart) OR A paper copy in the mail If you have any lab test that is abnormal or we need to change your treatment, we will call you to review the results.   Testing/Procedures: None   Follow-Up: At [facility], you and your health needs are our priority.  As part of our continuing mission to provide you with exceptional heart care, we have created designated Provider Care Teams.  These Care Teams include your primary Cardiologist (physician) and Advanced Practice Providers (APPs -  Physician Assistants and Nurse Practitioners) who all work together to provide you with the care you need, when you need it.  We recommend signing up for the patient portal called "MyChart".  Sign up information is provided on this After Visit Summary.  MyChart is used to connect with patients for Virtual Visits (Telemedicine).  Patients are able to view lab/test results, encounter notes, upcoming appointments, etc.  Non-urgent messages can be sent to your provider as well.   To learn more about what you can do with MyChart, go to [email or web address].    Your next appointment:   6 month(s)  The format for your next appointment:   In Person  Provider:   Shirlee More, MD   Other Instructions Drink 6-8 ounces of Schweppes/[place] dry at bedtime.

## 2021-08-24 NOTE — Progress Notes (Signed)
Cardiology Office Note:    Date:  08/24/2021   ID:  Lance Hicks, Lance Hicks May 24, 1946, MRN [id number]  PCP:  [name], PA-C  Cardiologist:  Shirlee More, MD    Referring MD: [name], PA-C    ASSESSMENT:    1. S/P AVR   2. Anticoagulated on Coumadin   3. Coronary artery disease involving native coronary artery of native heart with angina pectoris ([place])   4. Chronic atrial fibrillation (HCC)   5. Chronic diastolic heart failure (HCC)   6. Pulmonary arterial hypertension ([place])   7. Hypotension, chronic   8. Atrial flutter, unspecified type ([place])   9. Other hyperlipidemia    PLAN:    In order of problems listed above:  Overall Lance Hicks is stable and functions at a remarkable level despite the severity of his cardiac disease and pulmonary artery hypertension.  Stable mechanical AVR function and effective anticoagulation with warfarin managed through our device clinic with good compliance Stable CAD he has had no recent anginal discomfort we will continue treatment including his warfarin aspirin and high intensity statin Stable atrial fibrillation a flutter rate controlled without suppressant treatment.  This is his chronic rhythm. His heart failure is nicely compensated continue his current loop and distal diuretic he is not on Entresto with idiopathic hypotension Stable pulmonary artery hypertension continue current treatment tadalafil directed by the heart failure program he has had a good symptomatic response Continue midodrine he is has no symptomatic hypotensive episodes Continue with statin lipids are at target   Next appointment: I will see him in 6 months he receives the predominance of his care for heart failure and pulmonary hypertension through advanced heart failure   Medication Adjustments/Labs and Tests Ordered: Current medicines are reviewed at length with the patient today.  Concerns regarding medicines are outlined above.  No orders of the defined  types were placed in this encounter.  No orders of the defined types were placed in this encounter.   Follow-up for multiple cardiac problems atrial flutter anticoagulation mechanical AVR heart failure pulmonary artery hypertension and chronic hypotension.   History of Present Illness:    Lance Hicks is a 75 y.o. male with a hx of chronic atrial flutter chronic anticoagulation with warfarin CAD with CABG and mechanical AVR carpal metrics valve under 25 and 2010 with Maze procedure and left atrial appendage occlusion device as well as pulmonary artery hypertension CKD and symptomatic orthostatic hypotension requiring midodrine support.  He was last seen 02/10/2020.  Compliance with diet, lifestyle and medications: Yes  He had [facility] spotted fever despite antibiotics has not fully recovered he lost strength and endurance. He is not having orthostatic hypotension Breathing is good he has mild exertional shortness of breath but still does vigorous outdoor work he recently fenced his property has no edema chest pain palpitation or syncope. He remains anticoagulated without bleeding complication managed through our anticoagulant clinic.  Is predominantly followed in the heart failure clinic Dr. Haroldine Laws other problems include COPD with asthma and early pulmonary fibrosis with pulmonary artery hypertension  Studies: - Echo (4/22): EF 50-55%, RV ok, severe RVSP 61.6 mmHg, severe biatrial enlargement, moderate TR, stable AVR, AV mean gradient 11 mmHg   - Echo (3/21): EF 45-50% AVR ok. Septal flattening RV dilated mild HK. Moderate TR RVSP 80 severe bilateral enlargement.    - RHC 05/14/18 showed Mild/Moderate PAH in setting of high output with no evidence of intracardiac shunting. Hemodynamics as below.    - High-Res  Chest CT 05/16/18 1. Slightly irregular 0.9 cm peripheral right middle lobe solid pulmonary nodule. 3 month follow up recommended.  2. Dependent basilar predominant  patchy subpleural reticulation and ground-glass attenuation with the suggestion of minimal associated traction bronchiolectasis. No frank honeycombing. Findings may indicate an interstitial lung disease such as early usual interstitial pneumonia (UIP) or fibrotic phase nonspecific interstitial pneumonia (NSIP). Suggest a follow-up high-resolution chest CT study in 6-12 months to assess temporal pattern stability, as clinically warranted. 3. Mild cardiomegaly. Minimal interlobular septal thickening and trace dependent bilateral pleural effusions.  4. Small to moderate volume perihepatic ascites. 5. Ectatic 4.3 cm ascending thoracic aorta. Recommend annual imaging followup by CTA or MRA.  6. Left main and 3 vessel coronary atherosclerosis.   - US Abdomen RUQ 05/16/18 - s/p cholecystectomy. + hepatic cirrhosis. Mild ascites.    - Liver Doppler 05/16/18 - No hepatic, splenic, or portal venous thrombosis or occlusion. Mild ascites.   - Echo with bubble study 05/16/18 LVEF 55-60% with "late bubbles" , Mechanical AVR stable with trivial perivalvular regurg, Mild MR, Severe LAE, Severe RV dilation and reduced function. No PFO. Mod TR, PA peak pressure 68 mm Hg   - RHC 05/14/18 RA = 18 RV = 71/17 PA = 69/24 (39) PCW = 20 Fick cardiac output/index = 7.0/3.7 Thermo CO/CI = 7.2/3.8 PVR = 2.6 WU Ao sat = 97% PA sat =  73%, 73% SVC sat = 73%  RA sat = 72%   - RHC 01/18/18 RA = 23 RV = 84/21 PA = 81/38 (53) PCW = 22 Fick cardiac output/index = 3.0/1.6 PVR = 10.4 WU FA sat = 98% on 2L  (checked on RA = 93%) PA sat = 60%, 61% SVC sat = 62%   -14 Day Patch 10/01/2017-10/15/2017: -1 run of Ventricular Tachycardia occurred lasting 10 beats with a max rate of 240 bpm (avg 161 bpm). -Atrial Flutter occurred continuously (100% burden), ranging from 55-132 bpm (avg of 87 bpm).   - NM Ventilation and Perfusion Lung Scan 10/31/2017:  Low probability of pulmonary embolism, using the PIOPED  criteria.   - PFTs 07/02/2017:       FVC     86% FEV1   85% FEV1/FVC  73% DLCO  8.2, 32%   - 6MW -  1400 feet; 97% to 85% (recovered with resting)                                    CT Chest 07/02/2017: 1. Stable right middle lobe pulmonary nodule measuring up to 1 cm. 2. Minimal subpleural reticulation without evidence of honeycombing to  suggest pulmonary fibrosis. Stable centrilobular emphysema. 3. Mediastinal lymphadenopathy, increased from prior. 4. Calcified right hilar lymph nodes and multiple calcified granulomas  throughout the right lung consistent with prior granulomatous disease. 5. Ascending thoracic aortic aneurysm measuring up to 4.5 cm, unchanged. 6. Dilatation of the main pulmonary artery suggestive of pulmonary  hypertension. 7. Cardiomegaly and dense atherosclerotic coronary artery calcifications.   His anticoagulation is managed and her anticoagulant clinic: Component Ref Range & Units 1 d ago  (08/23/21) 1 mo ago  (07/12/21) 2 mo ago  (05/31/21) 4 mo ago  (04/19/21) 5 mo ago  (03/22/21) 5 mo ago  (03/01/21) 6 mo ago  (02/15/21)  INR 2.0 - 3.0 2.2  3.0  2.4  3.2 Abnormal   2.2  1.8 Abnormal   2.9   Past  Medical History:  Diagnosis Date   Allergic rhinitis 04/28/2016   Last Assessment & Plan:  Continue astelin   Anemia 07/01/2019   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   Last Assessment & Plan:  Stable in size on annual imaging. In light of concurrent left nephrolithiasis, will check CT renal colic next year instead of renal US.    Anticoagulated on Coumadin 01/04/2018   Anxiety 05/10/2016   Last Assessment & Plan:  Doing well off of zoloft.   Asthma    Asymptomatic microscopic hematuria 06/20/2017   Last Assessment & Plan:  Had hematuria workup [facility] in 2016 which negative CT and cystoscopy. UA with 2+ blood last visit - we discussed recommendation for repeat workup at 5 years or if degree of hematuria progresses.    Atrial fibrillation ([place]) 03/29/2017   Atrial  flutter ([place]) 03/29/2017   Backache 12/14/2015   Last Assessment & Plan:  Pain management referral for further evaluation.   Bilateral pleural effusion 08/09/2017   CHF (congestive heart failure) (HCC)    Chronic allergic rhinitis 04/28/2016   Last Assessment & Plan:  Continue astelin   Chronic anticoagulation 03/29/2017   Chronic atrial fibrillation ([place]) 07/23/2015   Last Assessment & Plan:  Coumadin and metoprolol, cardiology referral to establish care.   Chronic midline back pain 12/14/2015   Last Assessment & Plan:  Pain management referral for further evaluation.   Chronic obstructive lung disease ([place]) 06/12/2016   With hypoxia   Chronic prostatitis 07/23/2015   Last Assessment & Plan:  Has largely resolved since stopping bike riding. Recommend annual DRE AND PSA - will see back 12/2015 for annual screening, given 1st degree fhx. To call office for recurrent prostatitis symptoms.    Chronic respiratory failure with hypoxia ([place]) 07/29/2019   Cirrhosis of liver not due to alcohol ([place]) 07/01/2019   COPD (chronic obstructive pulmonary disease) ([place]) 06/12/2016   Coronary arteriosclerosis in native artery 03/29/2017   Coronary artery disease involving native coronary artery of native heart with angina pectoris ([place]) 03/29/2017   Cough 10/17/2016   Last Assessment & Plan:  Discussed typical course for acute viral illness. If symptoms worsen or fail to improve by 7-10d, delayed ATBs, fluids, rest, NSAIDs/APAP prn. Seek care if not improving. Needs earlier INR check due to ATBs.   Dyspnea 02/01/2016   Last Assessment & Plan:  Overall improving, eval by pulm, plan for CT, neg stress test with cardiology. Recent switch to carvedilol due to side effects.   Encounter for therapeutic drug monitoring 01/06/2019   Enterococcal bacteremia    Epidermoid cyst of skin 08/24/2017   Essential hypertension 12/14/2015   Last Assessment & Plan:  Hypertension control: controlled  Medications: compliant Medication Management:  as noted in orders Home blood pressure monitoring recommended additionally as needed for symptoms  The patient's care plan was reviewed and updated. Instructions and counseling were provided regarding patient goals and barriers. He was counseled to adopt a healthy lifestyle. Educational resources and self-management tools have been provided as charted in [facility] list.    H/O maze procedure 03/29/2017   [name] 03/29/2017   Overview:  2011   History of coronary artery bypass graft 03/29/2017   Hx of CABG 03/29/2017   Hyperlipidemia 03/29/2017   Hypersensitivity angiitis ([place]) 10/01/2017   Hypertensive heart disease 03/29/2017   Hypertensive heart disease with heart failure ([place]) 03/29/2017   Hypertensive heart failure ([place]) 03/29/2017   Hypokalemia due to excessive renal loss of  potassium 02/18/2018   Hypotension, chronic 06/06/2018   International normalized ratio (INR) raised 07/26/2017   Kidney stone 07/23/2015   Kidney stones 07/23/2015   Overview:  x 3  Last Assessment & Plan:  By [place] has left nephrolithiasis, but not visible by KUB. Will check CT renal colic next year to assess both stone burden as well as to surveil AML.    Left ureteral stone 01/23/2018   Leukocytoclastic vasculitis ([place]) 10/01/2017   Localized edema 01/04/2018   Long term (current) use of anticoagulants 03/29/2017   Lumbar radicular pain 01/19/2016   Lumbar radiculopathy 01/19/2016   Maculopapular rash 09/03/2017   Microscopic hematuria 06/20/2017   Last Assessment & Plan:  Had hematuria workup in [facility] in [facility] which negative CT and cystoscopy. UA with 2+ blood last visit - we discussed recommendation for repeat workup at 5 years or if degree of hematuria progresses.    Multiple nodules of lung 06/12/2016   Nasal discharge 02/25/2016   Last Assessment & Plan:  Trial zyrtec and flonase   Nephrolithiasis 07/23/2015   Overview:  x 3  Last Assessment & Plan:  By [place] has left nephrolithiasis, but not visible by KUB. Will check CT  renal colic next year to assess both stone burden as well as to surveil AML.   Overview:  x 3  Last Assessment & Plan:  Has 87m nonobstructing LUP stone - not visible by KUB.  Will check renal [place]2019 - he will contact office sooner if symptomatic.    Non-sustained ventricular tachycardia ([place] 03/29/2017   Nonsustained ventricular tachycardia ([place] 03/29/2017   Other hyperlipidemia 03/29/2017   Palpitations 10/01/2017   Pleural effusion, bilateral 08/09/2017   Pneumonia due to COVID-19 virus 02/07/2021   Post-nasal drainage 02/25/2016   Last Assessment & Plan:  Trial zyrtec and flonase   Prostate cancer screening 06/20/2017   Last Assessment & Plan:  Recommend continued annual CaP screening until within 10 years of life expectancy. Given good health and fhx of longevity, would anticipate CaP screening to continue until age 75  PSA today and again in one year on day of visit.   Pulmonary arterial hypertension ([place] 02/20/2018   Pulmonary edema    Pulmonary hypertension ([place] 08/09/2017   Pulmonary nodules 06/12/2016   S/P AVR 03/20/2016   S/P AVR (aortic valve replacement) 03/20/2016   Strain of deltoid muscle, initial encounter    Supratherapeutic INR 07/26/2017   Syncope and collapse 02/01/2016   Typical atrial flutter ([place] 02/01/2016    Past Surgical History:  Procedure Laterality Date   CHOLECYSTECTOMY     CORONARY ARTERY BYPASS GRAFT     EXPLORATORY LAPAROTOMY  07/30/2017   FOOT SURGERY     FRACTURE SURGERY Right    wrist and forearm   HERNIA REPAIR     MECHANICAL AORTIC VALVE REPLACEMENT     NASAL SINUS SURGERY     RIGHT HEART CATH N/A 01/18/2018   Procedure: RIGHT HEART CATH;  Surgeon: BJolaine Artist MD;  [facility];  Service: Cardiovascular;  Laterality: N/A;   RIGHT HEART CATH N/A 05/14/2018   Procedure: RIGHT HEART CATH;  Surgeon: BJolaine Artist MD;  [facility];  Service: Cardiovascular;  Laterality: N/A;   TEE WITHOUT CARDIOVERSION N/A  05/21/2018   Procedure: TRANSESOPHAGEAL ECHOCARDIOGRAM (TEE);  Surgeon: BJolaine Artist MD;  [facility];  Service: Cardiovascular;  Laterality: N/A;   UPPER GASTROINTESTINAL ENDOSCOPY  07/12/2017   Patchy areas of  mucosal inflammation noted in the antrum with edema,erthema and ulcerations. Bx. Chronicfocally active gastritis.   VASECTOMY      Current Medications: Current Meds  Medication Sig   albuterol (VENTOLIN HFA) 108 (90 Base) MCG/ACT inhaler Inhale 2 puffs into the lungs every 6 (six) hours as needed for wheezing or shortness of breath.   aspirin EC 81 MG tablet Take 81 mg by mouth daily.   B Complex-C (B-COMPLEX WITH VITAMIN C) tablet Take 1 tablet by mouth daily. Unknown strength   busPIRone (BUSPAR) 7.5 MG tablet Take 1 tablet (7.5 mg total) by mouth 2 (two) times daily.   cetirizine (ZYRTEC) 10 MG tablet Take 10 mg by mouth daily.   Cholecalciferol (VITAMIN D) 125 MCG (5000 UT) CAPS Take 1 capsule by mouth daily.   Coenzyme Q10 (COQ10 PO) Take 1 capsule by mouth daily.   Cyanocobalamin (VITAMIN B-12 CR PO) Take 1 tablet by mouth daily.   Iron, Ferrous Sulfate, 325 (65 Fe) MG TABS Take 325 mg by mouth daily.   KRILL OIL PO Take 1 capsule by mouth daily.   losartan (COZAAR) 50 MG tablet Take 50 mg by mouth daily.   Magnesium Gluconate (MAGNESIUM 27 PO) Take 1 tablet by mouth every other day.    metolazone (ZAROXOLYN) 2.5 MG tablet TAKE 1 TABLET(2.5 MG) BY MOUTH DAILY AS NEEDED FOR FLUID RETENTION   midodrine (PROAMATINE) 10 MG tablet Take 1 tablet (10 mg total) by mouth 3 (three) times daily with meals.   OPSUMIT 10 MG tablet TAKE 1 TABLET DAILY   potassium chloride SA (KLOR-CON) 20 MEQ tablet Take 3 tablets (60 mEq total) by mouth in the morning AND 2 tablets (40 mEq total) every evening.   rosuvastatin (CRESTOR) 5 MG tablet TAKE 1 TABLET(5 MG) BY MOUTH DAILY   Selexipag (UPTRAVI) 400 MCG TABS Take 1 tablet by mouth daily. 200 mcg in the evening   spironolactone  (ALDACTONE) 25 MG tablet TAKE 1/2 TABLET BY MOUTH EVERY DAY   tadalafil, PAH, (ADCIRCA) 20 MG tablet Take 40 mg by mouth daily.   torsemide (DEMADEX) 20 MG tablet Take 60 mg daily alternating with 40 mg daily   warfarin (COUMADIN) 5 MG tablet TAKE 1/2 TO 1 TABLET BY MOUTH DAILY AS DIRECTED BY COUMADIN CLINIC     Allergies:   Amoxicillin-pot clavulanate, Pantoprazole sodium, Valium [diazepam], Tape, and Wound dressing adhesive   Social History   Socioeconomic History   Marital status: Single    Spouse name: Not on file   Number of children: Not on file   Years of education: Not on file   Highest education level: Not on file  Occupational History   Not on file  Tobacco Use   Smoking status: Former    Packs/day: 2.00    Years: 34.00    Pack years: 68.00    Types: Cigarettes    Quit date: 07/16/2000    Years since quitting: 21.1   Smokeless tobacco: Never  Vaping Use   Vaping Use: Never used  Substance and Sexual Activity   Alcohol use: Not Currently   Drug use: No   Sexual activity: Not on file  Other Topics Concern   Not on file  Social History Narrative   Not on file   Social Determinants of Health   Financial Resource Strain: Not on file  Food Insecurity: Not on file  Transportation Needs: Not on file  Physical Activity: Not on file  Stress: Not on file  Social Connections: Not  on file     Family History: The patient's family history includes Arthritis in his mother; Asthma in his mother; Heart attack in his father; Hypertension in his father; Stroke in his paternal grandmother. There is no history of Colon cancer. ROS:   Please see the history of present illness.    All other systems reviewed and are negative.  EKGs/Labs/Other Studies Reviewed:    The following studies were reviewed today:    Recent Labs: 06/17/2021: Brain Natriuretic Peptide 508 08/16/2021: ALT 12; BUN 35; Creatinine, Ser 1.59; Hemoglobin 9.4; Platelets 210.0; Potassium 3.4; Pro B  Natriuretic peptide (BNP) 601.0; Sodium 130 08/17/2021: TSH 2.71  Recent Lipid Panel    Component Value Date/Time   CHOL 133 10/12/2020 1104   TRIG 111 10/12/2020 1104   HDL 44 10/12/2020 1104   CHOLHDL 3.0 10/12/2020 1104   VLDL 11 10/21/2018 1028   LDLCALC 70 10/12/2020 1104    Physical Exam:    VS:  BP 120/66 (BP Location: Right Arm, Patient Position: Sitting, Cuff Size: Normal)   Pulse 62   Ht 5' 10" (1.778 m)   Wt 143 lb (64.9 kg)   SpO2 97%   BMI 20.52 kg/m     Wt Readings from Last 3 Encounters:  08/24/21 143 lb (64.9 kg)  08/23/21 141 lb (64 kg)  08/16/21 141 lb (64 kg)     GEN: He looks chronically ill well nourished, well developed in no acute distress poor muscle mass HEENT: Normal NECK: No JVD; No carotid bruits LYMPHATICS: No lymphadenopathy CARDIAC: Irregular rhythm sharp closing sound of his mechanical AVR 1 of 6 flow murmur no aortic regurgitation RESPIRATORY:  Clear to auscultation without rales, wheezing or rhonchi  ABDOMEN: Soft, non-tender, non-distended MUSCULOSKELETAL:  No edema; No deformity  SKIN: Warm and dry NEUROLOGIC:  Alert and oriented x 3 PSYCHIATRIC:  Normal affect    Signed, Shirlee More, MD  08/24/2021 3:26 PM    [facility]

## 2021-08-25 ENCOUNTER — Telehealth: Payer: Self-pay

## 2021-08-25 LAB — FECAL OCCULT BLOOD, IMMUNOCHEMICAL: Fecal Occult Bld: POSITIVE — AB

## 2021-08-25 NOTE — Addendum Note (Signed)
Addended by: Anabel Halon on: 08/25/2021 10:31 AM   Modules accepted: Orders

## 2021-08-25 NOTE — Telephone Encounter (Signed)
Positive Ifob  Caller: Cecille Rubin Receiver: Manuela Schwartz, RMA Date and time:  08/25/21 at 7:35 am

## 2021-08-25 NOTE — Telephone Encounter (Signed)
See lab results.

## 2021-08-26 ENCOUNTER — Telehealth: Payer: Self-pay | Admitting: Medical

## 2021-08-26 ENCOUNTER — Telehealth: Payer: Self-pay | Admitting: Gastroenterology

## 2021-08-26 MED ORDER — ONDANSETRON 4 MG PO TBDP: 4.0000 mg | ORAL_TABLET | Freq: Three times a day (TID) | ORAL | 0 refills | Status: DC | PRN

## 2021-08-26 NOTE — Telephone Encounter (Signed)
Chart opened to rx med, order lab, review chart, respond to my chart message or send message to staff member

## 2021-08-26 NOTE — Telephone Encounter (Signed)
Attempted to contact patient, no answer LMTCB

## 2021-08-26 NOTE — Telephone Encounter (Signed)
Inbound call from patient. Have a referral for blood in stool and low iron. Scheduled an appt for 10/12. But would like to be seen sooner.

## 2021-08-30 NOTE — Telephone Encounter (Signed)
Spoke with patient. Patient is scheduled to see Dr Lyndel Safe 10/12. Patient requesting sooner appt however no earlier appt available. Pt states he is have "stomach issues" Patient taking iron and relates this him coughing up blood every morning. Per patient no bright red blood but "in the middle of the road" in color. PT also states he has a positive fecal sample Pt advised if sx worsen to seek the ED. Status: Final result   Visible to patient: No (inaccessible in MyChart)   Next appt: 09/20/2021 at 01:00 PM in Cardiology ([facility])   Dx: Other fatigue   Specimen Information: Stool      3 Result Notes Component Ref Range & Units 6 d ago 2 yr ago  Fecal Occult Bld Negative Positive Abnormal   Positive Abnormal    Resulting Agency  [facility]     #1. GERD (s/p EGD 06/2017 - at [facility], [place] -report reviewed, mild gastritis with negative biopsies, no varices). Had allergic reaction to protonix. Now with bloating.   #2.  Liver Cirrhosis (likely cardiac liver cirrhosis with congestive hepatomegaly). No ETOH. No evidence of portal hypertension.  Mild ascites likely d/t right heart failure with cor pulmonale and pulmonary HTN. Neg [place] with doppler 04/2018. Nl AFP 3.2 04/2019. Alb 4.1 05/2019. [place] 06/06/2019-congestive hepatomegaly, prominent hepatic veins and IVC d/t elevated right heart pressures.   #3. Rectal bleeding with constipation d/t internal hemorrhoids. Neg hemoccult. Neg colon in [place] (resolved).   #4  Anemia of chronic disease.  No overt GI bleeding.   #5. Co-morbid conditions include COPD,RV failure with pulmonary HTN/cor pulmonale, Afib with Nl EF 55% (07/2019), CAD s/p CABG, mechanical AVR on Coumadin, chronic respiratory failure, HLD, HTN.  Leuko-classic vasculitis.   #6.  RUQ abdominal pain likely d/t congestive hepatomegaly.       Plan: - Start colace 1 tab po qd as he has done already. - Proceed with Hemoccult cards as already ordered. - Continue  Aciphex 26m po qd #30, 11 refills. - Continue fluid and salt restricted diet as he has been doing. - Continue torsemide and spironolactone as per cardiology.   - Continue Midodrine 10 mg every morning, afternoon and 15 mg nightly - FU in 12 weeks.  - Proceed with CT scan abdo/pelvis. - Not keen on getting EGD or colonoscopy done since he has to be taken off Coumadin. Trend CBC.         HPI:     Lance ODOHERTYis a 75y.o. male  For follow-up visit Very complicated, retired pEngineer, structural very active on Internet.   Did not tolerate omeprazole or Protonix.  Has been doing well on AcipHex 20 mg p.o. once a day.   Denies having any upper GI complaints.  No nausea, vomiting, heartburn, regurgitation, odynophagia or dysphagia.   Had right upper quadrant abdominal pain attributed to congestive hepatomegaly. LFTs showed some improvement as below. [place]/11[place]2019-showed prominent hepatic veins and IVC likely due to elevated right heart pressures.  No ascites or any other acute abnormalities.  Doppler showed normal direction of blood flow towards the liver. His diuretics were increased.  This is resulted in improvement of the right sided abdominal pain.  He has lost approximately 6 pounds over last 1 week.   Has been constipated and is better on Colace 1 tablet p.o. once a day.  No further rectal bleeding.  He has been given Hemoccult cards by Ed Saguier   Had negative colonoscopy in  2015 in [place].  He had to be on Lovenox bridging prior to colonoscopy which he did not like.  He would like to avoid colonoscopy at the present time.     Fells better since he has lost weight.   Past GI procedures : EGD 06/2011 as above.   Colonoscopy 2015: neg. Had small polyp in 2012. [place] H/O acute gallstone pancreatitis 07/2017.  Underwent lap chole on 07/29/2017.  Postoperatively had intra-abdominal bleed, taken back for open laparotomy.  Had bleeding from unclear omental source.  Had 8U PRBC.       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 In light of concurrent left nephrolithiasis, will check CT renal colic next year instead of renal US.    Anticoagulated on Coumadin 01/04/2018   Anxiety 05/10/2016    Last Assessment & Plan:  Doing well off of zoloft.   Asymptomatic microscopic hematuria 06/20/2017    Last Assessment & Plan:  Had hematuria workup in [facility] in 2016 which negative CT and cystoscopy. UA with 2+ blood last visit - we discussed recommendation for repeat workup at 5 years or if degree of hematuria progresses.    Atrial fibrillation ([place]) 03/29/2017   Atrial flutter ([place]) 03/29/2017   Chronic allergic rhinitis 04/28/2016    Last Assessment & Plan:  Continue astelin   Chronic anticoagulation 03/29/2017   Chronic atrial fibrillation 07/23/2015    Last Assessment & Plan:  Coumadin and metoprolol, cardiology referral to establish care.   Chronic midline back pain 12/14/2015    Last Assessment & Plan:  Pain management referral for further evaluation.   Chronic prostatitis 07/23/2015    Last Assessment & Plan:  Has largely resolved since stopping bike riding. Recommend annual DRE AND PSA - will see back 12/2015 for annual screening, given 1st degree fhx. To call office for recurrent prostatitis symptoms.    COPD (chronic obstructive pulmonary disease) ([place]) 06/12/2016   Coronary artery disease involving native coronary artery of native heart with angina pectoris ([place]) 03/29/2017   Cough 10/17/2016    Last Assessment & Plan:  Discussed typical course for acute viral illness. If symptoms worsen or fail to improve by 7-10d, delayed ATBs, fluids, rest, NSAIDs/APAP prn. Seek care if not improving. Needs earlier INR check due to ATBs.   Dyspnea 02/01/2016    Last Assessment & Plan:  Overall improving, eval by pulm, plan for CT, neg stress test with cardiology. Recent switch to carvedilol due to side effects.    Epidermoid cyst of skin 08/24/2017   Essential hypertension 12/14/2015    Last Assessment & Plan:  Hypertension control: controlled  Medications: compliant Medication Management: as noted in orders Home blood pressure monitoring recommended additionally as needed for symptoms  The patient's care plan was reviewed and updated. Instructions and counseling were provided regarding patient goals and barriers. He was counseled to adopt a healthy lifestyle. Educational resources and self-management tools have been provided as charted in [facility] list.    H/O maze procedure 03/29/2017   [name] 03/29/2017    Overview:  2011   Hx of CABG 03/29/2017   Hyperlipidemia 03/29/2017   Hypertensive heart disease 03/29/2017   Kidney stones 07/23/2015    Overview:  x 3  Last Assessment & Plan:  By [place] has left nephrolithiasis, but not visible by KUB. Will check CT renal colic next year to  assess both stone burden as well as to surveil AML.    Leukocytoclastic vasculitis ([place]) 10/01/2017   Localized edema 01/04/2018   Lumbar radicular pain 01/19/2016   Maculopapular rash 09/03/2017   Nephrolithiasis 07/23/2015    Overview:  x 3  Last Assessment & Plan:  By [place] has left nephrolithiasis, but not visible by KUB. Will check CT renal colic next year to assess both stone burden as well as to surveil AML.   Overview:  x 3  Last Assessment & Plan:  Has 43m nonobstructing LUP stone - not visible by KUB.  Will check renal [place]8/2019 - he will contact office sooner if symptomatic.    Non-sustained ventricular tachycardia ([place] 03/29/2017   Other hyperlipidemia 03/29/2017   Palpitations 10/01/2017   Paroxysmal atrial fibrillation ([facility]) 03/29/2017   Paroxysmal atrial fibrillation ([place] 03/29/2017   Pleural effusion, bilateral 08/09/2017   Post-nasal drainage 02/25/2016    Last Assessment & Plan:  Trial zyrtec and flonase   Prostate cancer screening 06/20/2017    Last Assessment & Plan:  Recommend continued annual CaP screening  until within 10 years of life expectancy. Given good health and fhx of longevity, would anticipate CaP screening to continue until age 75  PSA today and again in one year on day of visit.   Pulmonary hypertension ([place] 08/09/2017   Pulmonary nodules 06/12/2016   S/P AVR (aortic valve replacement) 03/20/2016   Supratherapeutic INR 07/26/2017   Syncope 03/29/2017   Syncope and collapse 02/01/2016   Typical atrial flutter ([place] 02/01/2016           Past Surgical History:  Procedure Laterality Date   CHOLECYSTECTOMY       CORONARY ARTERY BYPASS GRAFT       EXPLORATORY LAPAROTOMY   07/30/2017   FOOT SURGERY       FRACTURE SURGERY Right      wrist and forearm   HERNIA REPAIR       MECHANICAL AORTIC VALVE REPLACEMENT       NASAL SINUS SURGERY       RIGHT HEART CATH N/A 01/18/2018    Procedure: RIGHT HEART CATH;  Surgeon: BJolaine Artist MD;  [facility];  Service: Cardiovascular;  Laterality: N/A;   RIGHT HEART CATH N/A 05/14/2018    Procedure: RIGHT HEART CATH;  Surgeon: BJolaine Artist MD;  [facility];  Service: Cardiovascular;  Laterality: N/A;   TEE WITHOUT CARDIOVERSION N/A 05/21/2018    Procedure: TRANSESOPHAGEAL ECHOCARDIOGRAM (TEE);  Surgeon: BJolaine Artist MD;  [facility];  Service: Cardiovascular;  Laterality: N/A;   UPPER GASTROINTESTINAL ENDOSCOPY   07/12/2017    Patchy areas of mucosal inflammation noted in the antrum with edema,erthema and ulcerations. Bx. Chronicfocally active gastritis.   VASECTOMY               Family History  Problem Relation Age of Onset   Asthma Mother     Arthritis Mother     Heart attack Father     Hypertension Father     Stroke Paternal Grandmother     Colon cancer Neg Hx        Social History         Tobacco Use   Smoking status: Former Smoker      Packs/day: 2.00      Years: 34.00      Pack years: 68.00      Types: Cigarettes      Quit date: 07/16/2000  Years since quitting:  18.9   Smokeless tobacco: Never Used  Substance Use Topics   Alcohol use: Not Currently   Drug use: No            Current Outpatient Medications  Medication Sig Dispense Refill   acetaminophen (TYLENOL) 650 MG CR tablet Take 1 tablet (650 mg total) by mouth every 8 (eight) hours as needed for pain. Not to use with norco since combined with tylenol. 30 tablet 0   albuterol (PROVENTIL HFA;VENTOLIN HFA) 108 (90 Base) MCG/ACT inhaler Inhale 2 puffs into the lungs as needed for wheezing or shortness of breath.       aspirin EC 81 MG tablet Take 81 mg by mouth daily.       B Complex-C (B-COMPLEX WITH VITAMIN C) tablet Take 1 tablet by mouth daily.        cetirizine (ZYRTEC ALLERGY) 10 MG tablet Take 10 mg by mouth daily as needed for allergies.        Cholecalciferol (VITAMIN D) 125 MCG (5000 UT) CAPS Take 1 capsule by mouth daily.       Coenzyme Q10 (COQ10 PO) Take 1 tablet by mouth daily.       hydrocortisone (ANUSOL-HC) 2.5 % rectal cream APP REC AA BID FOR 10 DAYS       macitentan (OPSUMIT) 10 MG tablet Take 1 tablet (10 mg total) by mouth daily. 30 tablet 0   Magnesium Gluconate (MAGNESIUM 27 PO) Take 1 tablet by mouth daily.       midodrine (PROAMATINE) 10 MG tablet Take 1 tablet (10 mg total) by mouth 3 (three) times daily with meals. 90 tablet 6   multivitamin-lutein (OCUVITE-LUTEIN) CAPS capsule Take 1 capsule by mouth daily.       potassium chloride 20 MEQ TBCR Take 40 mEq by mouth daily. 184 tablet 2   rosuvastatin (CRESTOR) 5 MG tablet Take 1 tablet (5 mg total) by mouth daily. 30 tablet 6   spironolactone (ALDACTONE) 25 MG tablet TAKE 1/2 OF A TABLET BY MOUTH ONCE DAILY. 45 tablet 3   tadalafil, PAH, (ADCIRCA) 20 MG tablet TAKE 2 TABLETS BY MOUTH ONCE A DAY 60 tablet 12   torsemide (DEMADEX) 20 MG tablet Take 3 tablets (60 mg total) by mouth daily. 270 tablet 3   warfarin (COUMADIN) 5 MG tablet As directed by coumadin clinic (Patient taking differently: daily. As directed by  coumadin clinic) 30 tablet 3    No current facility-administered medications for this visit.            Allergies  Allergen Reactions   Protonix [Pantoprazole Sodium]        Gets gassy and starting itching like crazy   Amoxicillin-Pot Clavulanate Diarrhea      Stomach pain Has patient had a PCN reaction causing immediate rash, facial/tongue/throat swelling, SOB or lightheadedness with hypotension: No Has patient had a PCN reaction causing severe rash involving mucus membranes or skin necrosis: No Has patient had a PCN reaction that required hospitalization: No Has patient had a PCN reaction occurring within the last 10 years: Yes If all of the above answers are "NO", then may proceed with Cephalosporin use.     Tape Rash and Other (See Comments)      Surgical tape      Review of Systems:  Has easy bruisability Multiple skin bruises.     Physical Exam:     Ht 5' 9.5" (1.765 m)   Wt 143 lb 9.6 oz (65.1 kg)  BMI 20.90 kg/m     Filed Weights    06/12/19 1357  Weight: 143 lb 9.6 oz (65.1 kg)    Not examined since it was a tele-visit   Data Reviewed: I have personally reviewed following labs and imaging studies   CBC: CBC Latest Ref Rng & Units 06/11/2019 06/06/2019 01/01/2019  WBC 4.0 - 10.5 K/uL 5.7 6.4 6.9  Hemoglobin 13.0 - 17.0 g/dL 9.3(L) 8.9(L) 13.6  Hematocrit 39.0 - 52.0 % 30.0(L) 30.1(L) 40.1  Platelets 150.0 - 400.0 K/uL 287.0 289 302.0      CMP: CMP Latest Ref Rng & Units 06/06/2019 01/01/2019 11/13/2018  Glucose 70 - 99 mg/dL 81 84 97  BUN 8 - 23 mg/dL 27(H) 29(H) 30(H)  Creatinine 0.61 - 1.24 mg/dL 1.34(H) 1.15 1.25  Sodium 135 - 145 mmol/L 135 136 135  Potassium 3.5 - 5.1 mmol/L 3.4(L) 4.2 3.9  Chloride 98 - 111 mmol/L 102 101 98  CO2 22 - 32 mmol/L _0 Calcium 8.9 - 10.3 mg/dL 9.2 9.9 9.9  Total Protein 6.5 - 8.1 g/dL 8.5(H) 7.8 -  Total Bilirubin 0.3 - 1.2 mg/dL 2.1(H) 1.3(H) -  Alkaline Phos 38 - 126 U/L 69 65 -  AST 15 - 41 U/L 32 27 -  ALT  0 - 44 U/L 16 13 -    Hepatic Function Latest Ref Rng & Units 06/06/2019 01/01/2019 05/18/2018  Total Protein 6.5 - 8.1 g/dL 8.5(H) 7.8 7.3  Albumin 3.5 - 5.0 g/dL 4.1 4.3 2.9(L)  AST 15 - 41 U/L 32 27 39  ALT 0 - 44 U/L 16 13 13(L)  Alk Phosphatase 38 - 126 U/L 69 65 69  Total Bilirubin 0.3 - 1.2 mg/dL 2.1(H) 1.3(H) 2.5(H)  Bilirubin, Direct 0.1 - 0.5 mg/dL - - 1.1(H)          Radiology Studies:   Ct Chest High Resolution   Result Date: 05/16/2018 IMPRESSION: 1. Slightly irregular 0.9 cm peripheral right middle lobe solid pulmonary nodule. 2. minimal associated traction bronchiolectasis. 3. Mild cardiomegaly. Minimal interlobular septal thickening and trace dependent bilateral pleural effusions. These findings suggest a mild component of congestive heart failure. 4. Small to moderate volume perihepatic ascites. 5. Ectatic 4.3 cm ascending thoracic aorta. 6. Left main and 3 vessel coronary atherosclerosis. Aortic Atherosclerosis (ICD10-I70.0) and Emphysema (ICD10-J43.9). Electronically Signed   By: Ilona Sorrel M.D.   On: 05/16/2018 16:11      US Liver Doppler:   Result Date: 05/16/2018 CLINICAL DATA:  Hepatic cirrhosis. EXAM: DUPLEX ULTRASOUND OF LIVER TECHNIQUE: Color and duplex Doppler ultrasound was performed to evaluate the hepatic in-flow and out-flow vessels. COMPARISON:  None. FINDINGS: Portal Vein Velocities Main:  26 cm/sec Right:  14 cm/sec Left:  18 cm/sec Normal hepatopetal flow is noted in the portal veins. Hepatic Vein Velocities Right:  40 cm/sec Middle:  30 cm/sec Left:  32 cm/sec Normal hepatofugal flow is noted in the hepatic veins. Hepatic Artery Velocity:  65 cm/sec Splenic Vein Velocity:  33.4 cm/sec Varices: Absent. Ascites: Present. Superior mesenteric vein velocity: 30 centimeters/second IMPRESSION: No Doppler evidence of hepatic, splenic or portal venous thrombosis or occlusion. Mild ascites is noted. Electronically Signed   By: Marijo Conception, M.D.   On: 05/16/2018  13:47    US Abdomen Limited Ruq:   Result Date: 05/16/2018 CLINICAL DATA:  Hepatic cirrhosis. EXAM: ULTRASOUND ABDOMEN LIMITED RIGHT UPPER QUADRANT COMPARISON:  None. FINDINGS: Gallbladder: Status post cholecystectomy. Common bile duct: Diameter: 3.5 mm which is  within normal limits. Liver: No focal lesion identified. Mildly heterogeneous echotexture of hepatic parenchyma is noted with nodular hepatic contours suggesting cirrhosis. Mild ascites is noted. Portal vein is patent on color Doppler imaging with normal direction of blood flow towards the liver. IMPRESSION: Status post cholecystectomy. Findings consistent with hepatic cirrhosis. Mild ascites is noted around the liver. No focal abnormality is noted. Electronically Signed   By: Marijo Conception, M.D.   On: 05/16/2018 13:32      This service was provided via telemedicine.  The patient was located at home.  The provider was located in office.  The patient did consent to this telephone visit and is aware of possible charges through their insurance for this visit.    Time spent on call: 25 min         Carmell Austria, MD 06/12/2019, 3:32 PM   Cc: Elise Benne          Patient Instructions by Karena Addison, CMA at 06/12/2019 3:30 PM  Author: Karena Addison, CMA Author Type: Certified Medical Assistant Filed: 06/12/2019  4:06 PM  Note Status: Signed Cosign: Cosign Not Required Encounter Date: 06/12/2019  Editor: Karena Addison, CMA (Certified Medical Assistant)             If you are age 56 or older, your body mass index should be between 23-30. Your Body mass index is 20.9 kg/m. If this is out of the aforementioned range listed, please consider follow up with your Primary Care Provider.   If you are age 67 or younger, your body mass index should be between 19-25. Your Body mass index is 20.9 kg/m. If this is out of the aformentioned range listed, please consider follow up with your Primary Care Provider.      You have  [facility] CT [facility] [place] [address] [address] [address] [facility]).    You are scheduled on 06/19/19 at [place] should arrive 15 minutes prior to your appointment time for registration. Please follow the written instructions below on the day of your exam:   WARNING: IF YOU ARE ALLERGIC TO IODINE/X-RAY DYE, PLEASE NOTIFY RADIOLOGY IMMEDIATELY AT [phone]! YOU WILL BE GIVEN A 13 HOUR PREMEDICATION PREP.   1) Do not eat or drink anything after 5am (4 hours prior to your test) 2) You have been given 2 bottles of oral contrast to drink. The solution may taste better if refrigerated, but do NOT add ice or any other liquid to this solution. Shake well before drinking.                          Drink 1 bottle of contrast @ 7am (2 hours prior to your exam)             Drink 1 bottle of contrast @ 8am (1 hour prior to your exam)   You may take any medications as prescribed with a small amount of water, if necessary. If you take any of the following medications: METFORMIN, GLUCOPHAGE, GLUCOVANCE, AVANDAMET, RIOMET, FORTAMET, [place] MET, JANUMET, GLUMETZA or METAGLIP, you MAY be asked to HOLD this medication 48 hours AFTER the exam.   The purpose of you drinking the oral contrast is to aid in the visualization of your intestinal tract. The contrast solution may cause some diarrhea. Depending on your individual set of symptoms, you may also receive  an intravenous injection of x-ray contrast/dye. Plan on being at [facility] for 30 minutes or longer, depending on the type of exam you are having performed.   This test typically takes 30-45 minutes to complete.   If you have any questions regarding your exam or if you need to reschedule, you may call the CT department at [phone] between the hours of 8:00 am and 5:00 pm, Monday-Friday.  ___________________________________________________________________   Start colace  1 tab po qd as he has done already. - Proceed with Hemoccult cards as already ordered. - Continue fluid and salt restricted diet as he has been doing. - Continue torsemide and spironolactone.   - Continue Midodrine  - FU in 12 weeks.   Please advise, thank you.

## 2021-09-02 ENCOUNTER — Other Ambulatory Visit: Payer: Self-pay | Admitting: Medical

## 2021-09-06 ENCOUNTER — Other Ambulatory Visit (HOSPITAL_COMMUNITY): Payer: Self-pay

## 2021-09-06 ENCOUNTER — Telehealth (HOSPITAL_COMMUNITY): Payer: Self-pay | Admitting: *Deleted

## 2021-09-06 MED ORDER — TORSEMIDE 20 MG PO TABS: ORAL_TABLET | ORAL | 3 refills | Status: DC

## 2021-09-06 NOTE — Telephone Encounter (Signed)
Pt left vm stating he started iron 3 weeks ago. The iron started to bother his stomach so he took antiacids but started having swelling in his feet so he stopped the antiacids. Pt said he still has swelling in feet and legs and asked if Dr.Bensimhon recommends any med changes.    Routed to [place]

## 2021-09-07 NOTE — Telephone Encounter (Addendum)
Pt aware and agreeable with plan.  

## 2021-09-07 NOTE — Telephone Encounter (Signed)
error

## 2021-09-09 NOTE — Telephone Encounter (Signed)
Pt called today and said swelling hasnt improved much and he would really like to be seen in clinic on Monday. Pt aware Dr.Bensimhon's schedule is full and he can see the PA. Pt thanked me for the call and appt.

## 2021-09-10 NOTE — Progress Notes (Addendum)
Advanced Heart Failure Clinic Note   Date:  09/12/2021   PCP:  [name], PA-C  Pulmonology: Dr. Lamonte Sakai Cardiologist:  Dr. Bettina Gavia  HF Cardiologist: Dr. 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 Also has COPD (quit smoking in 2002 - 1 ppd x 25 years), HL, and HTN.  In 8/18 admitted for acute gallstone pancreatitis. Underwent lap cholecystectomy on 07/29/2017. On 8/6 he developed hypotension and was taken back for open laparotomy due to bleeding from an unclear omental source. He was given fluids and 8 units PRBC perioperatively. He says his breathing has been bad since that time.   Seen at [place] in 2018 had hi-res CT, PFTs and VQ (low prob). Felt to have Golds I COPD with asthma overlap and early pulmonary fibrosis with PAH. He was also seen by Cardiology. Echo 8/18 as below showed normal LV function with septal flattening and RVSP 65-21mHG. 14-day event monitor showed 10-beat run NSVT. Felt to have chronic AF and PAH. No further testing ordered.  Underwent RHC on 01/18/18 with severe PAH and cor pulmonale. Macitentan and Adcirca. Subsequently started on selexipag but dose limited by cramping now on 400/200  Admitted 5/19 with symptomatic hypotension and weight gain. He had a repeat RHC and echo with bubble study (results below). He was started on midodrine for BP support. Diuresed 25 lbs with IV lasix, then transitioned to torsemide 60 mg daily. Abdominal [place] cirrhosis with no evidence of portal HTN. Pulm consulted for possible ILD on High-res chest CT. ue to COVID and diarrhea. He recovered. He had stable NYHA II symptoms at follow up 4/22, repeat echo scheduled (see results below).  [facility], [facility]  On doxy for 5 weeks.   Acute visit 08/02/21 feeling poorly, NYHA III symptoms, volume ok. Suspected worsening anxiety driving symptoms.    Today he returns for unscheduled acute visit.  He had increasing SOB and swelling last week, took metolazone x 3 days, then was instructed to double his torsemide for 2 days by our office. He says his hands, ears and cheeks are blue/purple. He continues to be SOB, is more cold, and legs are still swollen. He is dizzy and his balance is off. Denies CP. He has early satiety. Weight at home 140 pounds. Taking all medications except he has stopped his oral iron tablets due to making him "sick.". He brought an overnight bag with him today in anticipation of being admitted.  Studies: - Echo (4/22): EF 50-55%, RV ok, severe RVSP 61.6 mmHg, severe biatrial enlargement, moderate TR, stable AVR, AV mean gradient 11 mmHg  - Echo (3/21): EF 45-50% AVR ok. Septal flattening RV dilated mild HK. Moderate TR RVSP 80 severe bilateral enlargement.   - RHC 05/14/18 showed Mild/Moderate PAH in setting of high output with no evidence of intracardiac shunting. Hemodynamics as below.    - High-Res Chest CT 05/16/18 1. Slightly irregular 0.9 cm peripheral right middle lobe solid pulmonary nodule. 3 month follow up recommended.  2. Dependent basilar predominant patchy subpleural reticulation and ground-glass attenuation with the suggestion of minimal associated traction bronchiolectasis. No frank honeycombing. Findings may indicate an interstitial lung disease such as early usual interstitial pneumonia (UIP) or fibrotic phase nonspecific interstitial pneumonia (  NSIP). Suggest a follow-up high-resolution chest CT study in 6-12 months to assess temporal pattern stability, as clinically warranted. 3. Mild cardiomegaly. Minimal interlobular septal thickening and trace dependent bilateral pleural effusions.  4. Small to moderate volume perihepatic ascites. 5. Ectatic 4.3 cm ascending  thoracic aorta. Recommend annual imaging followup by CTA or MRA.  6. Left main and 3 vessel coronary atherosclerosis.   - US Abdomen RUQ 05/16/18 - s/p cholecystectomy. + hepatic cirrhosis. Mild ascites.    - Liver Doppler 05/16/18 - No hepatic, splenic, or portal venous thrombosis or occlusion. Mild ascites.   - Echo with bubble study 05/16/18 LVEF 55-60% with "late bubbles" , Mechanical AVR stable with trivial perivalvular regurg, Mild MR, Severe LAE, Severe RV dilation and reduced function. No PFO. Mod TR, PA peak pressure 68 mm Hg   - RHC 05/14/18 RA = 18 RV = 71/17 PA = 69/24 (39) PCW = 20 Fick cardiac output/index = 7.0/3.7 Thermo CO/CI = 7.2/3.8 PVR = 2.6 WU Ao sat = 97% PA sat =  73%, 73% SVC sat = 73%  RA sat = 72%  - RHC 01/18/18 RA = 23 RV = 84/21 PA = 81/38 (53) PCW = 22 Fick cardiac output/index = 3.0/1.6 PVR = 10.4 WU FA sat = 98% on 2L  (checked on RA = 93%) PA sat = 60%, 61% SVC sat = 62%  -14 Day Patch 10/01/2017-10/15/2017: -1 run of Ventricular Tachycardia occurred lasting 10 beats with a max rate of 240 bpm (avg 161 bpm). -Atrial Flutter occurred continuously (100% burden), ranging from 55-132 bpm (avg of 87 bpm).   - NM Ventilation and Perfusion Lung Scan 10/31/2017:  Low probability of pulmonary embolism, using the PIOPED criteria.  - PFTs 07/02/2017:       FVC     86% FEV1   85% FEV1/FVC  73% DLCO  8.2, 32%   - 6MW -  1400 feet; 97% to 85% (recovered with resting)                                    CT Chest 07/02/2017: 1. Stable right middle lobe pulmonary nodule measuring up to 1 cm. 2. Minimal subpleural reticulation without evidence of honeycombing to  suggest pulmonary fibrosis. Stable centrilobular emphysema. 3. Mediastinal lymphadenopathy, increased from prior. 4. Calcified right hilar lymph nodes and multiple calcified granulomas  throughout the right lung consistent with prior granulomatous disease. 5. Ascending thoracic aortic  aneurysm measuring up to 4.5 cm, unchanged. 6. Dilatation of the main pulmonary artery suggestive of pulmonary  hypertension. 7. Cardiomegaly and dense atherosclerotic coronary artery calcifications.   Review of systems complete and found to be negative unless listed in HPI.   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 In light of concurrent left nephrolithiasis, will check CT renal colic next year instead of renal US.    Anticoagulated on Coumadin 01/04/2018   Anxiety 05/10/2016   Last Assessment & Plan:  Doing well off of zoloft.   Asthma    Asymptomatic microscopic hematuria 06/20/2017   Last Assessment & Plan:  Had hematuria workup in [facility] in 2016 which negative CT and cystoscopy. UA with 2+ blood last visit - we discussed recommendation  for repeat workup at 5 years or if degree of hematuria progresses.    Atrial fibrillation ([place]) 03/29/2017   Atrial flutter ([place]) 03/29/2017   Backache 12/14/2015   Last Assessment & Plan:  Pain management referral for further evaluation.   Bilateral pleural effusion 08/09/2017   CHF (congestive heart failure) (HCC)    Chronic allergic rhinitis 04/28/2016   Last Assessment & Plan:  Continue astelin   Chronic anticoagulation 03/29/2017   Chronic atrial fibrillation ([place]) 07/23/2015   Last Assessment & Plan:  Coumadin and metoprolol, cardiology referral to establish care.   Chronic midline back pain 12/14/2015   Last Assessment & Plan:  Pain management referral for further evaluation.   Chronic obstructive lung disease ([place]) 06/12/2016   With hypoxia   Chronic prostatitis 07/23/2015   Last Assessment & Plan:  Has largely resolved since stopping bike riding. Recommend annual DRE AND PSA - will see back 12/2015 for annual screening, given 1st degree fhx. To call office for  recurrent prostatitis symptoms.    Chronic respiratory failure with hypoxia ([place]) 07/29/2019   Cirrhosis of liver not due to alcohol ([place]) 07/01/2019   COPD (chronic obstructive pulmonary disease) ([place]) 06/12/2016   Coronary arteriosclerosis in native artery 03/29/2017   Coronary artery disease involving native coronary artery of native heart with angina pectoris ([place]) 03/29/2017   Cough 10/17/2016   Last Assessment & Plan:  Discussed typical course for acute viral illness. If symptoms worsen or fail to improve by 7-10d, delayed ATBs, fluids, rest, NSAIDs/APAP prn. Seek care if not improving. Needs earlier INR check due to ATBs.   Dyspnea 02/01/2016   Last Assessment & Plan:  Overall improving, eval by pulm, plan for CT, neg stress test with cardiology. Recent switch to carvedilol due to side effects.   Encounter for therapeutic drug monitoring 01/06/2019   Enterococcal bacteremia    Epidermoid cyst of skin 08/24/2017   Essential hypertension 12/14/2015   Last Assessment & Plan:  Hypertension control: controlled  Medications: compliant Medication Management: as noted in orders Home blood pressure monitoring recommended additionally as needed for symptoms  The patient's care plan was reviewed and updated. Instructions and counseling were provided regarding patient goals and barriers. He was counseled to adopt a healthy lifestyle. Educational resources and self-management tools have been provided as charted in [facility] list.    H/O maze procedure 03/29/2017   [name] 03/29/2017   Overview:  2011   History of coronary artery bypass graft 03/29/2017   Hx of CABG 03/29/2017   Hyperlipidemia 03/29/2017   Hypersensitivity angiitis ([place]) 10/01/2017   Hypertensive heart disease 03/29/2017   Hypertensive heart disease with heart failure ([place]) 03/29/2017   Hypertensive heart failure ([place]) 03/29/2017   Hypokalemia due to excessive renal loss of potassium 02/18/2018   Hypotension, chronic 06/06/2018    International normalized ratio (INR) raised 07/26/2017   Kidney stone 07/23/2015   Kidney stones 07/23/2015   Overview:  x 3  Last Assessment & Plan:  By [place] has left nephrolithiasis, but not visible by KUB. Will check CT renal colic next year to assess both stone burden as well as to surveil AML.    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 UA with 2+ blood last visit - we discussed recommendation for repeat workup at 5 years or if degree of hematuria progresses.    Multiple nodules of lung 06/12/2016   Nasal discharge 02/25/2016   Last Assessment & Plan:  Trial zyrtec and flonase   Nephrolithiasis 07/23/2015   Overview:  x 3  Last Assessment & Plan:  By [place] has left nephrolithiasis, but not visible by KUB. Will check CT renal colic next year to assess both stone burden as well as to surveil AML.   Overview:  x 3  Last Assessment & Plan:  Has 22m nonobstructing LUP stone - not visible by KUB.  Will check renal [place]8/2019 - he will contact office sooner if symptomatic.    Non-sustained ventricular tachycardia ([place] 03/29/2017   Nonsustained ventricular tachycardia ([place] 03/29/2017   Other hyperlipidemia 03/29/2017   Palpitations 10/01/2017   Pleural effusion, bilateral 08/09/2017   Pneumonia due to COVID-19 virus 02/07/2021   Post-nasal drainage 02/25/2016   Last Assessment & Plan:  Trial zyrtec and flonase   Prostate cancer screening 06/20/2017   Last Assessment & Plan:  Recommend continued annual CaP screening until within 10 years of life expectancy. Given good health and fhx of longevity, would anticipate CaP screening to continue until age 75  PSA today and again in one  year on day of visit.   Pulmonary arterial hypertension ([place] 02/20/2018   Pulmonary edema    Pulmonary hypertension ([place] 08/09/2017   Pulmonary nodules 06/12/2016   S/P AVR 03/20/2016   S/P AVR (aortic valve replacement) 03/20/2016   Strain of deltoid muscle, initial encounter    Supratherapeutic INR 07/26/2017   Syncope and collapse 02/01/2016   Typical atrial flutter ([place] 02/01/2016   Past Surgical History:  Procedure Laterality Date   CHOLECYSTECTOMY     CORONARY ARTERY BYPASS GRAFT     EXPLORATORY LAPAROTOMY  07/30/2017   FOOT SURGERY     FRACTURE SURGERY Right    wrist and forearm   HERNIA REPAIR     MECHANICAL AORTIC VALVE REPLACEMENT     NASAL SINUS SURGERY     RIGHT HEART CATH N/A 01/18/2018   Procedure: RIGHT HEART CATH;  Surgeon: BJolaine Artist MD;  [facility];  Service: Cardiovascular;  Laterality: N/A;   RIGHT HEART CATH N/A 05/14/2018   Procedure: RIGHT HEART CATH;  Surgeon: BJolaine Artist MD;  [facility];  Service: Cardiovascular;  Laterality: N/A;   TEE WITHOUT CARDIOVERSION N/A 05/21/2018   Procedure: TRANSESOPHAGEAL ECHOCARDIOGRAM (TEE);  Surgeon: BJolaine Artist MD;  [facility];  Service: Cardiovascular;  Laterality: N/A;   UPPER GASTROINTESTINAL ENDOSCOPY  07/12/2017   Patchy areas of mucosal inflammation noted in the antrum with edema,erthema and ulcerations. Bx. Chronicfocally active gastritis.   VASECTOMY     Current Medications: Current Meds  Medication Sig   albuterol (VENTOLIN HFA) 108 (90 Base) MCG/ACT inhaler Inhale 2 puffs into the lungs every 6 (six) hours as needed for wheezing or shortness of breath.   aspirin EC 81 MG tablet Take 81 mg by mouth daily.   B Complex-C (B-COMPLEX WITH VITAMIN C) tablet Take 1 tablet by mouth daily. Unknown strength   busPIRone (BUSPAR) 7.5 MG tablet Take 1 tablet (7.5 mg total) by mouth 2 (two) times daily.   cetirizine (ZYRTEC) 10 MG tablet Take 10 mg by mouth  daily.   Cholecalciferol (VITAMIN D)  125 MCG (5000 UT) CAPS Take 1 capsule by mouth daily.   Coenzyme Q10 (COQ10 PO) Take 1 capsule by mouth daily.   Cyanocobalamin (VITAMIN B-12 CR PO) Take 1 tablet by mouth daily.   Iron, Ferrous Sulfate, 325 (65 Fe) MG TABS Take 325 mg by mouth daily.   KRILL OIL PO Take 1 capsule by mouth daily.   losartan (COZAAR) 50 MG tablet Take 50 mg by mouth daily.   Magnesium Gluconate (MAGNESIUM 27 PO) Take 1 tablet by mouth every other day.    metolazone (ZAROXOLYN) 2.5 MG tablet TAKE 1 TABLET(2.5 MG) BY MOUTH DAILY AS NEEDED FOR FLUID RETENTION   midodrine (PROAMATINE) 10 MG tablet Take 1 tablet (10 mg total) by mouth 3 (three) times daily with meals.   OPSUMIT 10 MG tablet TAKE 1 TABLET DAILY   potassium chloride SA (KLOR-CON) 20 MEQ tablet Take 3 tablets (60 mEq total) by mouth in the morning AND 2 tablets (40 mEq total) every evening.   rosuvastatin (CRESTOR) 5 MG tablet TAKE 1 TABLET(5 MG) BY MOUTH DAILY   Selexipag (UPTRAVI) 400 MCG TABS Take 1 tablet by mouth daily. 200 mcg in the evening   spironolactone (ALDACTONE) 25 MG tablet TAKE 1/2 TABLET BY MOUTH EVERY DAY   tadalafil, PAH, (ADCIRCA) 20 MG tablet Take 40 mg by mouth daily.   torsemide (DEMADEX) 20 MG tablet Take 60 mg daily alternating with 40 mg daily   warfarin (COUMADIN) 5 MG tablet TAKE 1/2 TO 1 TABLET BY MOUTH DAILY AS DIRECTED BY COUMADIN CLINIC    Allergies:   Amoxicillin-pot clavulanate, Pantoprazole sodium, Valium [diazepam], Tape, and Wound dressing adhesive   Social History   Socioeconomic History   Marital status: Single    Spouse name: Not on file   Number of children: Not on file   Years of education: Not on file   Highest education level: Not on file  Occupational History   Not on file  Tobacco Use   Smoking status: Former    Packs/day: 2.00    Years: 34.00    Pack years: 68.00    Types: Cigarettes    Quit date: 07/16/2000    Years since quitting: 21.1   Smokeless  tobacco: Never  Vaping Use   Vaping Use: Never used  Substance and Sexual Activity   Alcohol use: Not Currently   Drug use: No   Sexual activity: Not on file  Other Topics Concern   Not on file  Social History Narrative   Not on file   Social Determinants of Health   Financial Resource Strain: Not on file  Food Insecurity: Not on file  Transportation Needs: Not on file  Physical Activity: Not on file  Stress: Not on file  Social Connections: Not on file    Family History: The patient's family history includes Arthritis in his mother; Asthma in his mother; Heart attack in his father; Hypertension in his father; Stroke in his paternal grandmother. There is no history of Colon cancer.  Recent Labs: 06/17/2021: Brain Natriuretic Peptide 508 08/16/2021: ALT 12; BUN 35; Creatinine, Ser 1.59; Hemoglobin 9.4; Platelets 210.0; Potassium 3.4; Pro B Natriuretic peptide (BNP) 601.0; Sodium 130 08/17/2021: TSH 2.71   Recent Lipid Panel    Component Value Date/Time   CHOL 133 10/12/2020 1104   TRIG 111 10/12/2020 1104   HDL 44 10/12/2020 1104   CHOLHDL 3.0 10/12/2020 1104   VLDL 11 10/21/2018 1028   LDLCALC 70 10/12/2020 1104   BP 128/88  Pulse 74   Wt 66 kg (145 lb 6.4 oz)   SpO2 95%   BMI 20.86 kg/m     Wt Readings from Last 3 Encounters:  09/12/21 66 kg (145 lb 6.4 oz)  08/24/21 64.9 kg (143 lb)  08/23/21 64 kg (141 lb)   Physical Exam:  General:  Anxious. No resp difficulty with speaking full sentences HEENT: Normal Neck: Supple. JVP to jaw. Carotids 2+ bilat; no bruits. No lymphadenopathy or thryomegaly appreciated. Cor: PMI nondisplaced. Irregular rate & rhythm. No rubs, gallops or murmurs; mechanical S2 Lungs: Diminished throughout Abdomen: Soft, nontender, nondistended. No hepatosplenomegaly. No bruits or masses. Good bowel sounds. Extremities: No cyanosis, clubbing, rash, 1-2+LE edema Neuro: Alert & oriented x 3, cranial nerves grossly intact. Moves all 4  extremities w/o difficulty. Affect pleasant.  ECG: Atrial fibrillation with PVC 76 bpm (personally reviewed)  ReDs: 38%  Assessment/Plan  1. Pulmonary hypertension with cor pulmonale/RV failure - WHO Group I  - ? Component of HHT/shunt/AVMs with late bubbles on bubble study.  - Echo with bubble study (05/16/18): EF 55-60% with "late bubbles" , Mechanical AVR stable with trivial perivalvular regurg, Mild MR, Severe LAE, Severe RV dilation and reduced function. No PFO. Mod TR, PA peak pressure 68 mm Hg - Echo (10/19): EF 55-60% mild LVH; s/p AVR with mean gradient 11 mmHg and trace AI; mild MR; severe biatrial enlargement; mild RVE; severe TR; severe pulmonary hypertension. RVSP 71mHG - Echo (4/22): EF 50-55%, RV ok, severe RVSP 61.6 mmHg, severe biatrial enlargement, moderate TR, stable AVR, AV mean gradient 11 mmHg - RHC (1/19): with mod/severe pulm HTN with RV failure.   - RHC (05/14/18) with Mild/Moderate PAH in setting of high output with no evidence of intracardiac shunting. - Ab u/s with cirrhosis but no evidence of portal HTN. - Improved with Macitentan and Adcirca. Uptravi dose at 400/200 limited by cramping   - Worse today NYHA IIIb-early IV symptoms, volume mildly elevated on exam, ReDs 38%. - He was given lasix 80 mg IV x 1 in clinic + metolazone 2.5 mg + 40 KCl in clinic and voided 1 L urine. Edema some better, but he is still SOB. - Will arrange for [facility] to give lasix 80 mg IV + extra 40 KCl daily x 2 days (hold daily torsemide x 2 days). Discussed with Dr. BHaroldine Laws - On Thursday 9/21, resume torsemide 60 mg daily alternating with 40 mg daily.  - Continue spiro 12.5 mg daily.  - Off losartan due to low BP. Consider restarting after diuresing. - Continue midodrine 15 mg bid (it was 10 tid but he says it works better this way). Discussed taking 15 mg tid if needed for low BP, especially in evenings. - Auto-immune serologies negative (checked twice). - Suspect anxiety  significant component of symptoms today. - Labs today, repeat BMET on Friday.  2. Chronic respiratory failure - [place] CT 05/16/18 with "dependent basilar predominant patchy subpleural reticulation and ground-glass attenuation with the suggestion of minimal associated traction bronchiolectasis. No frank honeycombing. Findings may indicate an interstitial lung disease such as early usual interstitial pneumonia (UIP) or fibrotic phase nonspecific interstitial pneumonia (NSIP)." - Followed by Dr. BLamonte Sakai - No change.   3. Chronic AFL - Rate controlled.  Now off metoprolol.  - Continue coumadin with mechanical AVR. - [place]   4. CAD - s/p CABG 07/2017 with Dr [place]oderic Palauin [place]  - No s/s ischemia. - Continue  Crestor 5 mg daily (unable to tolerate atorva due to myalgias).  - Lipids followed by Dr. Bettina Gavia.   5. S/p mechanical AVR - Stable on echo 4/22. - Continue coumadin/ASA 81.  - INR goal is 2.5-3 per Dr Bettina Gavia. - Aware of need for SBE prophylaxis.   6. Leukocytoclastic vasculitis - F/u with Rheumatology.  - No changes.   7. Cirrhosis - US Abdomen RUQ 05/16/18 - s/p cholecystectomy. + hepatic cirrhosis. Mild ascites.  - Liver Doppler 05/16/18 - No hepatic, splenic, or portal venous thrombosis or occlusion. Mild ascites. - Continue midodrine 15 mg bid (it was 10 tid but he says it works better this way)  8. Anemia - IDA. Recent Hgb 9.4. Denies abnormal bleeding. - He has stopped his oral iron tablets and requesting [name] infusions. - I will notify his PCP.  Follow up with APP in 10-14 days to reassess volume status (will need ReDs & labs) and with Dr. Haroldine Laws as scheduled next month.  [employer], FNP  09/12/21  Addendum: If he is not improved symptomatically at his follow up, will plan for RHC with Dr. Haroldine Laws (personally discussed with Dr. Haroldine Laws today 09/16/21).  Allena Katz, FNP-BC

## 2021-09-12 ENCOUNTER — Other Ambulatory Visit: Payer: Self-pay

## 2021-09-12 ENCOUNTER — Ambulatory Visit (HOSPITAL_COMMUNITY)
Admission: RE | Admit: 2021-09-12 | Discharge: 2021-09-12 | Disposition: A | Discharge status: Home / Self Care | Payer: Medicare Other | Source: Ambulatory Visit | Attending: Family Medicine | Admitting: Family Medicine

## 2021-09-12 ENCOUNTER — Encounter (HOSPITAL_COMMUNITY): Payer: Self-pay

## 2021-09-12 ENCOUNTER — Telehealth (HOSPITAL_COMMUNITY): Payer: Self-pay | Admitting: *Deleted

## 2021-09-12 VITALS — BP 128/88 | HR 74 | Wt 145.4 lb

## 2021-09-12 DIAGNOSIS — I483 Typical atrial flutter: Secondary | ICD-10-CM

## 2021-09-12 DIAGNOSIS — Z7982 Long term (current) use of aspirin: Secondary | ICD-10-CM | POA: Insufficient documentation

## 2021-09-12 DIAGNOSIS — Z7901 Long term (current) use of anticoagulants: Secondary | ICD-10-CM | POA: Diagnosis not present

## 2021-09-12 DIAGNOSIS — Z952 Presence of prosthetic heart valve: Secondary | ICD-10-CM | POA: Diagnosis not present

## 2021-09-12 DIAGNOSIS — I25119 Atherosclerotic heart disease of native coronary artery with unspecified angina pectoris: Secondary | ICD-10-CM

## 2021-09-12 DIAGNOSIS — K7469 Other cirrhosis of liver: Secondary | ICD-10-CM | POA: Diagnosis not present

## 2021-09-12 DIAGNOSIS — R42 Dizziness and giddiness: Secondary | ICD-10-CM | POA: Diagnosis not present

## 2021-09-12 DIAGNOSIS — R188 Other ascites: Secondary | ICD-10-CM | POA: Diagnosis not present

## 2021-09-12 DIAGNOSIS — Z87891 Personal history of nicotine dependence: Secondary | ICD-10-CM | POA: Insufficient documentation

## 2021-09-12 DIAGNOSIS — D509 Iron deficiency anemia, unspecified: Secondary | ICD-10-CM | POA: Diagnosis not present

## 2021-09-12 DIAGNOSIS — D649 Anemia, unspecified: Secondary | ICD-10-CM | POA: Insufficient documentation

## 2021-09-12 DIAGNOSIS — Z79899 Other long term (current) drug therapy: Secondary | ICD-10-CM | POA: Insufficient documentation

## 2021-09-12 DIAGNOSIS — J449 Chronic obstructive pulmonary disease, unspecified: Secondary | ICD-10-CM | POA: Diagnosis not present

## 2021-09-12 DIAGNOSIS — I110 Hypertensive heart disease with heart failure: Secondary | ICD-10-CM | POA: Diagnosis not present

## 2021-09-12 DIAGNOSIS — I2721 Secondary pulmonary arterial hypertension: Secondary | ICD-10-CM | POA: Insufficient documentation

## 2021-09-12 DIAGNOSIS — Z9049 Acquired absence of other specified parts of digestive tract: Secondary | ICD-10-CM | POA: Insufficient documentation

## 2021-09-12 DIAGNOSIS — I2510 Atherosclerotic heart disease of native coronary artery without angina pectoris: Secondary | ICD-10-CM | POA: Diagnosis not present

## 2021-09-12 DIAGNOSIS — I083 Combined rheumatic disorders of mitral, aortic and tricuspid valves: Secondary | ICD-10-CM | POA: Insufficient documentation

## 2021-09-12 DIAGNOSIS — I482 Chronic atrial fibrillation, unspecified: Secondary | ICD-10-CM | POA: Insufficient documentation

## 2021-09-12 DIAGNOSIS — Z8719 Personal history of other diseases of the digestive system: Secondary | ICD-10-CM | POA: Insufficient documentation

## 2021-09-12 DIAGNOSIS — E7849 Other hyperlipidemia: Secondary | ICD-10-CM | POA: Diagnosis not present

## 2021-09-12 DIAGNOSIS — Z880 Allergy status to penicillin: Secondary | ICD-10-CM | POA: Insufficient documentation

## 2021-09-12 DIAGNOSIS — Z951 Presence of aortocoronary bypass graft: Secondary | ICD-10-CM | POA: Diagnosis not present

## 2021-09-12 DIAGNOSIS — K7460 Unspecified cirrhosis of liver: Secondary | ICD-10-CM | POA: Insufficient documentation

## 2021-09-12 DIAGNOSIS — M310 Hypersensitivity angiitis: Secondary | ICD-10-CM | POA: Insufficient documentation

## 2021-09-12 DIAGNOSIS — Z8249 Family history of ischemic heart disease and other diseases of the circulatory system: Secondary | ICD-10-CM | POA: Insufficient documentation

## 2021-09-12 DIAGNOSIS — Z888 Allergy status to other drugs, medicaments and biological substances status: Secondary | ICD-10-CM | POA: Insufficient documentation

## 2021-09-12 DIAGNOSIS — I509 Heart failure, unspecified: Secondary | ICD-10-CM | POA: Insufficient documentation

## 2021-09-12 DIAGNOSIS — J9610 Chronic respiratory failure, unspecified whether with hypoxia or hypercapnia: Secondary | ICD-10-CM | POA: Insufficient documentation

## 2021-09-12 DIAGNOSIS — Z8616 Personal history of COVID-19: Secondary | ICD-10-CM | POA: Insufficient documentation

## 2021-09-12 LAB — BASIC METABOLIC PANEL
Anion gap: 14 (ref 5–15)
BUN: 29 mg/dL — ABNORMAL HIGH (ref 8–23)
CO2: 25 mmol/L (ref 22–32)
Calcium: 9.6 mg/dL (ref 8.9–10.3)
Chloride: 87 mmol/L — ABNORMAL LOW (ref 98–111)
Creatinine, Ser: 1.39 mg/dL — ABNORMAL HIGH (ref 0.61–1.24)
GFR, Estimated: 53 mL/min — ABNORMAL LOW (ref >60)
Glucose, Bld: 87 mg/dL (ref 70–99)
Potassium: 4 mmol/L (ref 3.5–5.1)
Sodium: 126 mmol/L — ABNORMAL LOW (ref 135–145)

## 2021-09-12 LAB — BRAIN NATRIURETIC PEPTIDE: B Natriuretic Peptide: 1141.4 pg/mL — ABNORMAL HIGH (ref 0.0–100.0)

## 2021-09-12 MED ORDER — POTASSIUM CHLORIDE CRYS ER 20 MEQ PO TBCR
40.0000 meq | EXTENDED_RELEASE_TABLET | Freq: Once | ORAL | Status: AC
  Administered 2021-09-12: 40 meq via ORAL

## 2021-09-12 MED ORDER — FUROSEMIDE 10 MG/ML IJ SOLN
80.0000 mg | Freq: Once | INTRAMUSCULAR | Status: AC
  Administered 2021-09-12: 80 mg via INTRAVENOUS

## 2021-09-12 MED ORDER — METOLAZONE 2.5 MG PO TABS
2.5000 mg | ORAL_TABLET | Freq: Once | ORAL | Status: AC
  Administered 2021-09-12: 2.5 mg via ORAL

## 2021-09-12 NOTE — Patient Instructions (Addendum)
Labs were done today, if any labs are abnormal the clinic will call you  You were given Lasix IV 80 mg, potassium 40 meq and 2.5 mg of metolazone in the clinic today  Your physician recommends that you schedule a follow-up appointment in: 2 weeks   Your physician recommends that you return for lab work in: 7 days   You have been referred to remote health and will be contacted for IV lasix administration  At the [facility], you and your health needs are our priority. As part of our continuing mission to provide you with exceptional heart care, we have created designated Provider Care Teams. These Care Teams include your primary Cardiologist (physician) and Advanced Practice Providers (APPs- Physician Assistants and Nurse Practitioners) who all work together to provide you with the care you need, when you need it.   You may see any of the following providers on your designated Care Team at your next follow up: Dr Glori Bickers Dr Loralie Champagne Dr Patrice Paradise, NP Lyda Jester, [place] Ginnie Smart Audry Riles, PharmD   Please be sure to bring in all your medications bottles to every appointment.    If you have any questions or concerns before your next appointment please send [place] a message through [place] or call our office at [phone].    TO LEAVE A MESSAGE FOR THE NURSE SELECT OPTION 2, PLEASE LEAVE A MESSAGE INCLUDING: YOUR NAME DATE OF BIRTH CALL BACK NUMBER REASON FOR CALL**this is important as we prioritize the call backs  YOU WILL RECEIVE A CALL BACK THE SAME DAY AS LONG AS YOU CALL BEFORE 4:00 PM

## 2021-09-12 NOTE — Progress Notes (Signed)
ReDS Vest / Clip - 09/12/21 1100       ReDS Vest / Clip   Station Marker C    Ruler Value 29    ReDS Value Range Moderate volume overload    ReDS Actual Value 38

## 2021-09-12 NOTE — Telephone Encounter (Signed)
Pt called stating he is very short of breath can only walk about 69f. Pt said he is " huffing and puffing" just trying to stand up. Pt said his cheeks and ears are purple and he's cold all the time. Pt said hes bringing an overnight bag because he's sure he will be admitted. Pt has an appt at 1130am. Per JDarden Palmerhave pt come to the clinic as soon as possible. Pt aware and on the way now.

## 2021-09-13 ENCOUNTER — Telehealth (HOSPITAL_COMMUNITY): Payer: Self-pay | Admitting: Family Medicine

## 2021-09-13 DIAGNOSIS — I110 Hypertensive heart disease with heart failure: Secondary | ICD-10-CM | POA: Diagnosis not present

## 2021-09-13 NOTE — Telephone Encounter (Signed)
Called patient to see how he was feeling after dose of IV lasix in clinic yesterday. Ronalee Belts says he is feeling much better, still has some swelling. I reminded Ronalee Belts not to take any home torsemide or metolazone while he was receiving IV lasix today and tomorrow via [facility]. He can resume torsemide on Thursday of this week. He verbalized understanding and was appreciative of call.  Allena Katz, FNP-BC

## 2021-09-14 ENCOUNTER — Telehealth: Payer: Self-pay | Admitting: Medical

## 2021-09-14 DIAGNOSIS — I110 Hypertensive heart disease with heart failure: Secondary | ICD-10-CM | POA: Diagnosis not present

## 2021-09-14 NOTE — Telephone Encounter (Signed)
Patient stated a fax was sent over yesterday from [facility]. Patient is requesting to get an iron infusion instead of meds because pills are messing with patient stomach. If any other questions develop please contact patient. Please advise.

## 2021-09-15 ENCOUNTER — Telehealth: Payer: Self-pay | Admitting: Medical

## 2021-09-15 DIAGNOSIS — E611 Iron deficiency: Secondary | ICD-10-CM

## 2021-09-15 DIAGNOSIS — D649 Anemia, unspecified: Secondary | ICD-10-CM

## 2021-09-15 NOTE — Telephone Encounter (Signed)
Do you remember receiving if not I will call patient and ask for the office info to resend fax

## 2021-09-15 NOTE — Telephone Encounter (Signed)
Future labs place for anemia and low iron.

## 2021-09-15 NOTE — Telephone Encounter (Signed)
No open appointments for you on Friday , patient sees cardiology on Monday , you agreed it was okay for patient to just get iron study    Please drop lab orders for patients lab visit tomorrow

## 2021-09-16 ENCOUNTER — Other Ambulatory Visit: Payer: Medicare Other

## 2021-09-16 ENCOUNTER — Telehealth (HOSPITAL_COMMUNITY): Payer: Self-pay | Admitting: Family Medicine

## 2021-09-16 DIAGNOSIS — I110 Hypertensive heart disease with heart failure: Secondary | ICD-10-CM | POA: Diagnosis not present

## 2021-09-16 DIAGNOSIS — A770 Spotted fever due to Rickettsia rickettsii: Secondary | ICD-10-CM | POA: Diagnosis not present

## 2021-09-16 NOTE — Telephone Encounter (Signed)
Spoke with Darrol Jump NP with [facility].   She informed me labs on 09/14/21 showed BNP 1935, K 2.6, SCr 1.31. He was instructed to take extra 40 KCl by [facility]. Patient, per [facility] RN, felt good, no cramps or weakness.   I asked that they draw stat BMET today to follow his K and will see him at his follow up  next week.  Allena Katz, FNP-BC

## 2021-09-16 NOTE — Addendum Note (Signed)
Encounter addended by: Rafael Bihari, FNP on: 09/16/2021 11:19 AM  Actions taken: Clinical Note Signed

## 2021-09-19 ENCOUNTER — Other Ambulatory Visit: Payer: Self-pay

## 2021-09-19 ENCOUNTER — Ambulatory Visit (HOSPITAL_COMMUNITY)
Admission: RE | Admit: 2021-09-19 | Discharge: 2021-09-19 | Disposition: A | Discharge status: Home / Self Care | Payer: Medicare Other | Source: Ambulatory Visit | Attending: Cardiology | Admitting: Cardiology

## 2021-09-19 DIAGNOSIS — I5032 Chronic diastolic (congestive) heart failure: Secondary | ICD-10-CM | POA: Diagnosis not present

## 2021-09-19 LAB — BASIC METABOLIC PANEL
Anion gap: 10 (ref 5–15)
BUN: 30 mg/dL — ABNORMAL HIGH (ref 8–23)
CO2: 25 mmol/L (ref 22–32)
Calcium: 9.8 mg/dL (ref 8.9–10.3)
Chloride: 94 mmol/L — ABNORMAL LOW (ref 98–111)
Creatinine, Ser: 1.49 mg/dL — ABNORMAL HIGH (ref 0.61–1.24)
GFR, Estimated: 49 mL/min — ABNORMAL LOW (ref >60)
Glucose, Bld: 105 mg/dL — ABNORMAL HIGH (ref 70–99)
Potassium: 4.6 mmol/L (ref 3.5–5.1)
Sodium: 129 mmol/L — ABNORMAL LOW (ref 135–145)

## 2021-09-20 ENCOUNTER — Telehealth (HOSPITAL_COMMUNITY): Payer: Self-pay | Admitting: *Deleted

## 2021-09-20 ENCOUNTER — Ambulatory Visit (INDEPENDENT_AMBULATORY_CARE_PROVIDER_SITE_OTHER): Payer: Medicare Other

## 2021-09-20 DIAGNOSIS — Z7901 Long term (current) use of anticoagulants: Secondary | ICD-10-CM | POA: Diagnosis not present

## 2021-09-20 DIAGNOSIS — Z5181 Encounter for therapeutic drug level monitoring: Secondary | ICD-10-CM | POA: Diagnosis not present

## 2021-09-20 DIAGNOSIS — I509 Heart failure, unspecified: Secondary | ICD-10-CM | POA: Diagnosis not present

## 2021-09-20 DIAGNOSIS — I4892 Unspecified atrial flutter: Secondary | ICD-10-CM

## 2021-09-20 LAB — POCT INR: INR: 3.4 — AB (ref 2.0–3.0)

## 2021-09-20 NOTE — Telephone Encounter (Signed)
Voicemail received from patient requesting information about his iron infusion appt. Reports his phone intermittently does not work and requested email (none on file) or text message with infusion info. Per Allena Katz NP's last clinic note, PCP is to scheduled iron infusions. I called and left VM for patient regarding the above. Instructed to call HF clinic if we can be of further assistance.   Received call back from patient. He reports continued swelling in his ankles. Janett Billow NP aware. [facility] plans to see patient at home and administer IV Lasix today or tomorrow. Pt's email address is [email or web address]. Placed in demographics. Pt verbalized understanding that [facility] will contact him about IV Lasix administration.   Emerson Monte RN [employer] Coordinator  Office: [phone]  24/7 Pager: [phone]

## 2021-09-20 NOTE — Patient Instructions (Signed)
Description   Hold today's dose and then continue taking 1/2 tablet daily except 1 tablet on Monday, Wednesday and Friday..  Please call our office with any medication changes or concerns [phone]. Return in 4 weeks for INR check.

## 2021-09-22 ENCOUNTER — Encounter: Payer: Self-pay | Admitting: Podiatry

## 2021-09-22 ENCOUNTER — Other Ambulatory Visit: Payer: Self-pay

## 2021-09-22 ENCOUNTER — Ambulatory Visit: Payer: Medicare Other

## 2021-09-22 ENCOUNTER — Ambulatory Visit (INDEPENDENT_AMBULATORY_CARE_PROVIDER_SITE_OTHER): Payer: Medicare Other | Admitting: Podiatry

## 2021-09-22 DIAGNOSIS — R2681 Unsteadiness on feet: Secondary | ICD-10-CM | POA: Diagnosis not present

## 2021-09-22 DIAGNOSIS — R2689 Other abnormalities of gait and mobility: Secondary | ICD-10-CM | POA: Diagnosis not present

## 2021-09-22 DIAGNOSIS — I25119 Atherosclerotic heart disease of native coronary artery with unspecified angina pectoris: Secondary | ICD-10-CM

## 2021-09-22 DIAGNOSIS — B351 Tinea unguium: Secondary | ICD-10-CM | POA: Diagnosis not present

## 2021-09-22 DIAGNOSIS — M79676 Pain in unspecified toe(s): Secondary | ICD-10-CM

## 2021-09-23 NOTE — Progress Notes (Signed)
Subjective:  Patient ID: DAXTER PAULE, male    DOB: 09-27-1946,  MRN: [id number] HPI Chief Complaint  Patient presents with   Foot Pain    Patient states he is having trouble with swelling, on lasix, also unstable gait, he is also unable to trim toenails himself   New Patient (Initial Visit)    75 y.o. male presents with the above complaint.   ROS: Denies fever chills nausea vomiting muscle aches pains calf pain back pain chest pain shortness of breath.  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 In light of concurrent left nephrolithiasis, will check CT renal colic next year instead of renal US.    Anticoagulated on Coumadin 01/04/2018   Anxiety 05/10/2016   Last Assessment & Plan:  Doing well off of zoloft.   Asthma    Asymptomatic microscopic hematuria 06/20/2017   Last Assessment & Plan:  Had hematuria workup in [facility] in 2016 which negative CT and cystoscopy. UA with 2+ blood last visit - we discussed recommendation for repeat workup at 5 years or if degree of hematuria progresses.    Atrial fibrillation ([place]) 03/29/2017   Atrial flutter ([place]) 03/29/2017   Backache 12/14/2015   Last Assessment & Plan:  Pain management referral for further evaluation.   Bilateral pleural effusion 08/09/2017   CHF (congestive heart failure) (HCC)    Chronic allergic rhinitis 04/28/2016   Last Assessment & Plan:  Continue astelin   Chronic anticoagulation 03/29/2017   Chronic atrial fibrillation ([place]) 07/23/2015   Last Assessment & Plan:  Coumadin and metoprolol, cardiology referral to establish care.   Chronic midline back pain 12/14/2015   Last Assessment & Plan:  Pain management referral for further evaluation.   Chronic obstructive lung disease ([place]) 06/12/2016   With hypoxia   Chronic prostatitis 07/23/2015    Last Assessment & Plan:  Has largely resolved since stopping bike riding. Recommend annual DRE AND PSA - will see back 12/2015 for annual screening, given 1st degree fhx. To call office for recurrent prostatitis symptoms.    Chronic respiratory failure with hypoxia ([place]) 07/29/2019   Cirrhosis of liver not due to alcohol ([place]) 07/01/2019   COPD (chronic obstructive pulmonary disease) ([place]) 06/12/2016   Coronary arteriosclerosis in native artery 03/29/2017   Coronary artery disease involving native coronary artery of native heart with angina pectoris ([place]) 03/29/2017   Cough 10/17/2016   Last Assessment & Plan:  Discussed typical course for acute viral illness. If symptoms worsen or fail to improve by 7-10d, delayed ATBs, fluids, rest, NSAIDs/APAP prn. Seek care if not improving. Needs earlier INR check due to ATBs.   Dyspnea 02/01/2016   Last Assessment & Plan:  Overall improving, eval by pulm, plan for CT, neg stress test with cardiology. Recent switch to carvedilol due to side effects.   Encounter for therapeutic drug monitoring 01/06/2019   Enterococcal bacteremia    Epidermoid cyst of skin 08/24/2017   Essential hypertension 12/14/2015   Last Assessment & Plan:  Hypertension control: controlled  Medications: compliant Medication Management: as noted in orders Home blood pressure monitoring recommended additionally as needed for symptoms  The patient's care plan was reviewed and updated. Instructions and counseling were provided regarding patient goals and barriers. He was counseled to adopt a healthy lifestyle. Educational  resources and self-management tools have been provided as charted in [facility] list.    H/O maze procedure 03/29/2017   [name] 03/29/2017   Overview:  2011   History of coronary artery bypass graft 03/29/2017   Hx of CABG 03/29/2017   Hyperlipidemia 03/29/2017   Hypersensitivity angiitis ([place]) 10/01/2017   Hypertensive heart disease 03/29/2017   Hypertensive heart disease  with heart failure ([place]) 03/29/2017   Hypertensive heart failure ([place]) 03/29/2017   Hypokalemia due to excessive renal loss of potassium 02/18/2018   Hypotension, chronic 06/06/2018   International normalized ratio (INR) raised 07/26/2017   Kidney stone 07/23/2015   Kidney stones 07/23/2015   Overview:  x 3  Last Assessment & Plan:  By [place] has left nephrolithiasis, but not visible by KUB. Will check CT renal colic next year to assess both stone burden as well as to surveil AML.    Left ureteral stone 01/23/2018   Leukocytoclastic vasculitis ([place]) 10/01/2017   Localized edema 01/04/2018   Long term (current) use of anticoagulants 03/29/2017   Lumbar radicular pain 01/19/2016   Lumbar radiculopathy 01/19/2016   Maculopapular rash 09/03/2017   Microscopic hematuria 06/20/2017   Last Assessment & Plan:  Had hematuria workup in [facility] in [facility] which negative CT and cystoscopy. UA with 2+ blood last visit - we discussed recommendation for repeat workup at 5 years or if degree of hematuria progresses.    Multiple nodules of lung 06/12/2016   Nasal discharge 02/25/2016   Last Assessment & Plan:  Trial zyrtec and flonase   Nephrolithiasis 07/23/2015   Overview:  x 3  Last Assessment & Plan:  By [place] has left nephrolithiasis, but not visible by KUB. Will check CT renal colic next year to assess both stone burden as well as to surveil AML.   Overview:  x 3  Last Assessment & Plan:  Has 71m nonobstructing LUP stone - not visible by KUB.  Will check renal [place]2019 - he will contact office sooner if symptomatic.    Non-sustained ventricular tachycardia ([place] 03/29/2017   Nonsustained ventricular tachycardia ([place] 03/29/2017   Other hyperlipidemia 03/29/2017   Palpitations 10/01/2017   Pleural effusion, bilateral 08/09/2017   Pneumonia due to COVID-19 virus 02/07/2021   Post-nasal drainage 02/25/2016   Last Assessment & Plan:  Trial zyrtec and flonase   Prostate cancer screening 06/20/2017   Last Assessment & Plan:  Recommend continued  annual CaP screening until within 10 years of life expectancy. Given good health and fhx of longevity, would anticipate CaP screening to continue until age 89  PSA today and again in one year on day of visit.   Pulmonary arterial hypertension ([place] 02/20/2018   Pulmonary edema    Pulmonary hypertension ([place] 08/09/2017   Pulmonary nodules 06/12/2016   S/P AVR 03/20/2016   S/P AVR (aortic valve replacement) 03/20/2016   Strain of deltoid muscle, initial encounter    Supratherapeutic INR 07/26/2017   Syncope and collapse 02/01/2016   Typical atrial flutter ([place] 02/01/2016   Past Surgical History:  Procedure Laterality Date   CHOLECYSTECTOMY     CORONARY ARTERY BYPASS GRAFT     EXPLORATORY LAPAROTOMY  07/30/2017   FOOT SURGERY     FRACTURE SURGERY Right    wrist and forearm   HERNIA REPAIR     MECHANICAL AORTIC VALVE REPLACEMENT     NASAL SINUS SURGERY     RIGHT HEART CATH N/A 01/18/2018   Procedure: RIGHT HEART CATH;  Surgeon:  Bensimhon, Shaune Pascal, MD;  Location: [facility];  Service: Cardiovascular;  Laterality: N/A;   RIGHT HEART CATH N/A 05/14/2018   Procedure: RIGHT HEART CATH;  Surgeon: Jolaine Artist, MD;  Location: [place] [facility];  Service: Cardiovascular;  Laterality: N/A;   TEE WITHOUT CARDIOVERSION N/A 05/21/2018   Procedure: TRANSESOPHAGEAL ECHOCARDIOGRAM (TEE);  Surgeon: Jolaine Artist, MD;  Location: [facility] ENDOSCOPY;  Service: Cardiovascular;  Laterality: N/A;   UPPER GASTROINTESTINAL ENDOSCOPY  07/12/2017   Patchy areas of mucosal inflammation noted in the antrum with edema,erthema and ulcerations. Bx. Chronicfocally active gastritis.   VASECTOMY      Current Outpatient Medications:    albuterol (VENTOLIN HFA) 108 (90 Base) MCG/ACT inhaler, Inhale 2 puffs into the lungs every 6 (six) hours as needed for wheezing or shortness of breath., Disp: 8 g, Rfl: 6   aspirin EC 81 MG tablet, Take 81 mg by mouth daily., Disp: , Rfl:    B Complex-C (B-COMPLEX WITH VITAMIN C)  tablet, Take 1 tablet by mouth daily. Unknown strength, Disp: , Rfl:    busPIRone (BUSPAR) 7.5 MG tablet, Take 1 tablet (7.5 mg total) by mouth 2 (two) times daily., Disp: 60 tablet, Rfl: 0   cetirizine (ZYRTEC) 10 MG tablet, Take 10 mg by mouth daily., Disp: , Rfl:    Cholecalciferol (VITAMIN D) 125 MCG (5000 UT) CAPS, Take 1 capsule by mouth daily., Disp: , Rfl:    Coenzyme Q10 (COQ10 PO), Take 1 capsule by mouth daily., Disp: , Rfl:    Cyanocobalamin (VITAMIN B-12 CR PO), Take 1 tablet by mouth daily., Disp: , Rfl:    Iron, Ferrous Sulfate, 325 (65 Fe) MG TABS, Take 325 mg by mouth daily., Disp: 60 tablet, Rfl: 0   KRILL OIL PO, Take 1 capsule by mouth daily., Disp: , Rfl:    losartan (COZAAR) 50 MG tablet, Take 50 mg by mouth daily., Disp: , Rfl:    Magnesium Gluconate (MAGNESIUM 27 PO), Take 1 tablet by mouth every other day. , Disp: , Rfl:    metolazone (ZAROXOLYN) 2.5 MG tablet, TAKE 1 TABLET(2.5 MG) BY MOUTH DAILY AS NEEDED FOR FLUID RETENTION, Disp: 20 tablet, Rfl: 0   midodrine (PROAMATINE) 10 MG tablet, Take 1 tablet (10 mg total) by mouth 3 (three) times daily with meals., Disp: 90 tablet, Rfl: 11   ondansetron (ZOFRAN ODT) 4 MG disintegrating tablet, Take 1 tablet (4 mg total) by mouth every 8 (eight) hours as needed for nausea or vomiting. (Patient not taking: Reported on 09/12/2021), Disp: 20 tablet, Rfl: 0   OPSUMIT 10 MG tablet, TAKE 1 TABLET DAILY, Disp: 30 tablet, Rfl: 11   potassium chloride SA (KLOR-CON) 20 MEQ tablet, Take 3 tablets (60 mEq total) by mouth in the morning AND 2 tablets (40 mEq total) every evening., Disp: 150 tablet, Rfl: 6   rosuvastatin (CRESTOR) 5 MG tablet, TAKE 1 TABLET(5 MG) BY MOUTH DAILY, Disp: 90 tablet, Rfl: 3   Selexipag (UPTRAVI) 400 MCG TABS, Take 1 tablet by mouth daily. 200 mcg in the evening, Disp: , Rfl:    spironolactone (ALDACTONE) 25 MG tablet, TAKE 1/2 TABLET BY MOUTH EVERY DAY, Disp: 45 tablet, Rfl: 3   tadalafil, PAH, (ADCIRCA) 20 MG  tablet, Take 40 mg by mouth daily., Disp: , Rfl:    torsemide (DEMADEX) 20 MG tablet, Take 60 mg daily alternating with 40 mg daily, Disp: 270 tablet, Rfl: 3   warfarin (COUMADIN) 5 MG tablet, TAKE 1/2 TO 1  TABLET BY MOUTH DAILY AS DIRECTED BY COUMADIN CLINIC, Disp: 30 tablet, Rfl: 5  Allergies  Allergen Reactions   Amoxicillin-Pot Clavulanate Diarrhea and Anaphylaxis    Stomach pain Has patient had a PCN reaction causing immediate rash, facial/tongue/throat swelling, SOB or lightheadedness with hypotension: No Has patient had a PCN reaction causing severe rash involving mucus membranes or skin necrosis: No Has patient had a PCN reaction that required hospitalization: No Has patient had a PCN reaction occurring within the last 10 years: Yes If all of the above answers are "NO", then may proceed with Cephalosporin use.  Stomach pain Has patient had a PCN reaction causing immediate rash, facial/tongue/throat swelling, SOB or lightheadedness with hypotension: No Has patient had a PCN reaction causing severe rash involving mucus membranes or skin necrosis: No Has patient had a PCN reaction that required hospitalization: No Has patient had a PCN reaction occurring within the last 10 years: Yes If all of the above answers are "NO", then may proceed with Cephalosporin use. n/v   Pantoprazole Sodium Nausea Only    Gets gassy and starting itching like crazy Gets gassy and starting itching like crazy   Valium [Diazepam] Other (See Comments)    HA and Abd pain.   Tape Rash and Other (See Comments)    Surgical tape   Wound Dressing Adhesive Other (See Comments) and Rash    Surgical tape Surgical tape Surgical tape   Review of Systems Objective:  There were no vitals filed for this visit.  General: Well developed, nourished, in no acute distress, alert and oriented x3   Dermatological: Skin is warm, dry and supple bilateral. Nails x 10 are well maintained; remaining integument appears  unremarkable at this time. There are no open sores, no preulcerative lesions, no rash or signs of infection present.  Elongated dystrophic nails  Vascular: Dorsalis Pedis artery and Posterior Tibial artery pedal pulses are 2/4 bilateral with immedate capillary fill time. Pedal hair growth present. No varicosities and no lower extremity edema present bilateral.   Neruologic: Grossly intact via light touch bilateral. Vibratory intact via tuning fork bilateral. Protective threshold with Semmes Wienstein monofilament intact to all pedal sites bilateral. Patellar and Achilles deep tendon reflexes 2+ bilateral. No Babinski or clonus noted bilateral.   Musculoskeletal: No gross boney pedal deformities bilateral. No pain, crepitus, or limitation noted with foot and ankle range of motion bilateral. Muscular strength 5/5 in all groups tested bilateral.  Flexible hammertoe deformities bilateral  Gait: Unassisted, Nonantalgic.    Radiographs:  None taken  Assessment & Plan:   Assessment: Balance and gait instability pain in limb secondary to onychomycosis.  Plan: We will send him for balance and gait training with physical therapy near [place].  I also debrided his nails.     Kelsee Preslar T. [place], [place]

## 2021-09-25 ENCOUNTER — Other Ambulatory Visit (HOSPITAL_COMMUNITY): Payer: Self-pay | Admitting: Internal Medicine

## 2021-09-26 NOTE — Progress Notes (Addendum)
Advanced Heart Failure Clinic Note   Date:  09/27/2021   PCP:  [name], PA-C  Pulmonology: Dr. Lamonte Sakai Cardiologist:  Dr. Bettina Gavia  HF Cardiologist: Dr. 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 Also has COPD (quit smoking in 2002 - 1 ppd x 25 years), HL, and HTN.  In 8/18 admitted for acute gallstone pancreatitis. Underwent lap cholecystectomy on 07/29/2017. On 8/6 he developed hypotension and was taken back for open laparotomy due to bleeding from an unclear omental source. He was given fluids and 8 units PRBC perioperatively. He says his breathing has been bad since that time.   Seen at [facility] in 2018 had hi-res CT, PFTs and VQ (low prob). Felt to have Golds I COPD with asthma overlap and early pulmonary fibrosis with PAH. He was also seen by Cardiology. Echo 8/18 as below showed normal LV function with septal flattening and RVSP 65-54mHG. 14-day event monitor showed 10-beat run NSVT. Felt to have chronic AF and PAH. No further testing ordered.  Underwent RHC on 01/18/18 with severe PAH and cor pulmonale. Macitentan and Adcirca. Subsequently started on selexipag but dose limited by cramping now on 400/200  Admitted 5/19 with symptomatic hypotension and weight gain. He had a repeat RHC and echo with bubble study (results below). He was started on midodrine for BP support. Diuresed 25 lbs with IV lasix, then transitioned to torsemide 60 mg daily. Abdominal [place]owed cirrhosis with no evidence of portal HTN. Pulm consulted for possible ILD on High-res chest CT.  Seen in ED on 01/27/21 due to hypotension due  to COVID and diarrhea. He recovered. He had stable NYHA II symptoms at follow up 4/22, repeat echo scheduled (see results below).  [facility], profound fatigue. Found  and treated for [facility] Fever. On doxy for 5 weeks.   Acute visit 08/02/21 feeling poorly, NYHA III symptoms, volume ok. Suspected worsening anxiety driving symptoms.    Seen 09/12/21 with NYHA IIIb-IV symptoms. ReDs 38%. Given 80 m g IV lasix + metolazone 2.5 in clinic. Then arranged for 80 mg IV lasix x 2 days with [facility]. I saw him for his lab appt and he was some better but still had LE edema so we did one more dose of 80 mg IV lasix with [facility], last dose given 09/21/21.  Today he returns for HF follow up. He says he can only walk 25 feet before becoming SOB. He is walking daily on his TM for 13-14 minutes at a time, pulse ox after 1 min walking is 91%, after 2 minutes 96%. He had some relief with IV lasix and metolazone, but says the fluid just comes back on. Denies CP, dizziness,or PND/Orthopnea. Appetite ok. No fever or chills. Taking all medications. Says his right kidney is hurting says it feels like a kidney stone, taking Tylenol. He is unsure if he is urinating as briskly over the past 2 days.  Studies: - Echo (4/22): EF 50-55%, RV ok, severe RVSP 61.6 mmHg, severe biatrial enlargement, moderate TR, stable AVR, AV mean gradient 11 mmHg  - Echo (3/21): EF 45-50% AVR ok. Septal flattening RV dilated mild HK. Moderate TR RVSP 80 severe bilateral enlargement.   - RHC 05/14/18 showed Mild/Moderate PAH in setting of high output  with no evidence of intracardiac shunting. Hemodynamics as below.    - High-Res Chest CT 05/16/18 1. Slightly irregular 0.9 cm peripheral right middle lobe solid pulmonary nodule. 3 month follow up recommended.  2. Dependent basilar predominant patchy subpleural reticulation and ground-glass attenuation with the suggestion of minimal associated traction bronchiolectasis. No frank honeycombing. Findings may indicate an interstitial lung disease such as early usual interstitial pneumonia (UIP) or fibrotic phase nonspecific interstitial  pneumonia (NSIP). Suggest a follow-up high-resolution chest CT study in 6-12 months to assess temporal pattern stability, as clinically warranted. 3. Mild cardiomegaly. Minimal interlobular septal thickening and trace dependent bilateral pleural effusions.  4. Small to moderate volume perihepatic ascites. 5. Ectatic 4.3 cm ascending thoracic aorta. Recommend annual imaging followup by CTA or MRA.  6. Left main and 3 vessel coronary atherosclerosis.   - US Abdomen RUQ 05/16/18 - s/p cholecystectomy. + hepatic cirrhosis. Mild ascites.    - Liver Doppler 05/16/18 - No hepatic, splenic, or portal venous thrombosis or occlusion. Mild ascites.   - Echo with bubble study 05/16/18 LVEF 55-60% with "late bubbles" , Mechanical AVR stable with trivial perivalvular regurg, Mild MR, Severe LAE, Severe RV dilation and reduced function. No PFO. Mod TR, PA peak pressure 68 mm Hg   - RHC 05/14/18 RA = 18 RV = 71/17 PA = 69/24 (39) PCW = 20 Fick cardiac output/index = 7.0/3.7 Thermo CO/CI = 7.2/3.8 PVR = 2.6 WU Ao sat = 97% PA sat =  73%, 73% SVC sat = 73%  RA sat = 72%  - RHC 01/18/18 RA = 23 RV = 84/21 PA = 81/38 (53) PCW = 22 Fick cardiac output/index = 3.0/1.6 PVR = 10.4 WU FA sat = 98% on 2L  (checked on RA = 93%) PA sat = 60%, 61% SVC sat = 62%  -14 Day Patch 10/01/2017-10/15/2017: -1 run of Ventricular Tachycardia occurred lasting 10 beats with a max rate of 240 bpm (avg 161 bpm). -Atrial Flutter occurred continuously (100% burden), ranging from 55-132 bpm (avg of 87 bpm).   - NM Ventilation and Perfusion Lung Scan 10/31/2017:  Low probability of pulmonary embolism, using the PIOPED criteria.  - PFTs 07/02/2017:       FVC     86% FEV1   85% FEV1/FVC  73% DLCO  8.2, 32%   - 6MW -  1400 feet; 97% to 85% (recovered with resting)                                    CT Chest 07/02/2017: 1. Stable right middle lobe pulmonary nodule measuring up to 1 cm. 2. Minimal subpleural  reticulation without evidence of honeycombing to  suggest pulmonary fibrosis. Stable centrilobular emphysema. 3. Mediastinal lymphadenopathy, increased from prior. 4. Calcified right hilar lymph nodes and multiple calcified granulomas  throughout the right lung consistent with prior granulomatous disease. 5. Ascending thoracic aortic aneurysm measuring up to 4.5 cm, unchanged. 6. Dilatation of the main pulmonary artery suggestive of pulmonary  hypertension. 7. Cardiomegaly and dense atherosclerotic coronary artery calcifications.   Review of systems complete and found to be negative unless listed in HPI.   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 In light of  concurrent left nephrolithiasis, will check CT renal colic next year instead of renal US.    Anticoagulated on Coumadin 01/04/2018   Anxiety 05/10/2016   Last Assessment & Plan:  Doing well off of zoloft.   Asthma    Asymptomatic microscopic hematuria 06/20/2017   Last Assessment & Plan:  Had hemat[facility] which negative CT and cystoscopy. UA with 2+ blood last visit - we discussed recommendation for repeat workup at 5 years or if degree of hematuria progresses.    Atrial fibrillation ([place]) 03/29/2017   Atrial flutter ([place]) 03/29/2017   Backache 12/14/2015   Last Assessment & Plan:  Pain management referral for further evaluation.   Bilateral pleural effusion 08/09/2017   CHF (congestive heart failure) (HCC)    Chronic allergic rhinitis 04/28/2016   Last Assessment & Plan:  Continue astelin   Chronic anticoagulation 03/29/2017   Chronic atrial fibrillation ([place]) 07/23/2015   Last Assessment & Plan:  Coumadin and metoprolol, cardiology referral to establish care.   Chronic midline back pain 12/14/2015   Last Assessment & Plan:  Pain  management referral for further evaluation.   Chronic obstructive lung disease ([place]) 06/12/2016   With hypoxia   Chronic prostatitis 07/23/2015   Last Assessment & Plan:  Has largely resolved since stopping bike riding. Recommend annual DRE AND PSA - will see back 12/2015 for annual screening, given 1st degree fhx. To call office for recurrent prostatitis symptoms.    Chronic respiratory failure with hypoxia ([place]) 07/29/2019   Cirrhosis of liver not due to alcohol ([place]) 07/01/2019   COPD (chronic obstructive pulmonary disease) ([place]) 06/12/2016   Coronary arteriosclerosis in native artery 03/29/2017   Coronary artery disease involving native coronary artery of native heart with angina pectoris ([place]) 03/29/2017   Cough 10/17/2016   Last Assessment & Plan:  Discussed typical course for acute viral illness. If symptoms worsen or fail to improve by 7-10d, delayed ATBs, fluids, rest, NSAIDs/APAP prn. Seek care if not improving. Needs earlier INR check due to ATBs.   Dyspnea 02/01/2016   Last Assessment & Plan:  Overall improving, eval by pulm, plan for CT, neg stress test with cardiology. Recent switch to carvedilol due to side effects.   Encounter for therapeutic drug monitoring 01/06/2019   Enterococcal bacteremia    Epidermoid cyst of skin 08/24/2017   Essential hypertension 12/14/2015   Last Assessment & Plan:  Hypertension control: controlled  Medications: compliant Medication Management: as noted in orders Home blood pressure monitoring recommended additionally as needed for symptoms  The patient's care plan was reviewed and updated. Instructions and counseling were provided regarding patient goals and barriers. He was counseled to adopt a healthy lifestyle. Educational resources and self-management tools have been provided as charted in [facility] list.    H/O maze procedure 03/29/2017   [name] 03/29/2017   Overview:  2011   History of coronary artery bypass graft 03/29/2017   Hx of CABG  03/29/2017   Hyperlipidemia 03/29/2017   Hypersensitivity angiitis ([place]) 10/01/2017   Hypertensive heart disease 03/29/2017   Hypertensive heart disease with heart failure ([place]) 03/29/2017   Hypertensive heart failure ([place]) 03/29/2017   Hypokalemia due to excessive renal loss of potassium 02/18/2018   Hypotension, chronic 06/06/2018   International normalized ratio (INR) raised 07/26/2017   Kidney stone 07/23/2015   Kidney stones 07/23/2015   Overview:  x 3  Last Assessment & Plan:  By [place] has left nephrolithiasis, but not visible by  KUB. Will check CT renal colic next year to assess both stone burden as well as to surveil AML.    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 UA with 2+ blood last visit - we discussed recommendation for repeat workup at 5 years or if degree of hematuria progresses.    Multiple nodules of lung 06/12/2016   Nasal discharge 02/25/2016   Last Assessment & Plan:  Trial zyrtec and flonase   Nephrolithiasis 07/23/2015   Overview:  x 3  Last Assessment & Plan:  By [place] has left nephrolithiasis, but not visible by KUB. Will check CT renal colic next year to assess both stone burden as well as to surveil AML.   Overview:  x 3  Last Assessment & Plan:  Has 69m nonobstructing LUP stone - not visible by KUB.  Will check renal [place]2019 - he will contact office sooner if symptomatic.    Non-sustained ventricular tachycardia 03/29/2017   Nonsustained ventricular tachycardia 03/29/2017   Other hyperlipidemia 03/29/2017   Palpitations 10/01/2017   Pleural effusion, bilateral 08/09/2017   Pneumonia due to COVID-19 virus 02/07/2021   Post-nasal drainage 02/25/2016   Last  Assessment & Plan:  Trial zyrtec and flonase   Prostate cancer screening 06/20/2017   Last Assessment & Plan:  Recommend continued annual CaP screening until within 10 years of life expectancy. Given good health and fhx of longevity, would anticipate CaP screening to continue until age 75  PSA today and again in one year on day of visit.   Pulmonary arterial hypertension ([place] 02/20/2018   Pulmonary edema    Pulmonary hypertension ([place] 08/09/2017   Pulmonary nodules 06/12/2016   S/P AVR 03/20/2016   S/P AVR (aortic valve replacement) 03/20/2016   Strain of deltoid muscle, initial encounter    Supratherapeutic INR 07/26/2017   Syncope and collapse 02/01/2016   Typical atrial flutter ([place] 02/01/2016   Past Surgical History:  Procedure Laterality Date   CHOLECYSTECTOMY     CORONARY ARTERY BYPASS GRAFT     EXPLORATORY LAPAROTOMY  07/30/2017   FOOT SURGERY     FRACTURE SURGERY Right    wrist and forearm   HERNIA REPAIR     MECHANICAL AORTIC VALVE REPLACEMENT     NASAL SINUS SURGERY     RIGHT HEART CATH N/A 01/18/2018   Procedure: RIGHT HEART CATH;  Surgeon: BJolaine Artist MD;  [facility];  Service: Cardiovascular;  Laterality: N/A;   RIGHT HEART CATH N/A 05/14/2018   Procedure: RIGHT HEART CATH;  Surgeon: BJolaine Artist MD;  [facility];  Service: Cardiovascular;  Laterality: N/A;   TEE WITHOUT CARDIOVERSION N/A 05/21/2018   Procedure: TRANSESOPHAGEAL ECHOCARDIOGRAM (TEE);  Surgeon: BJolaine Artist MD;  [facility];  Service: Cardiovascular;  Laterality: N/A;   UPPER GASTROINTESTINAL ENDOSCOPY  07/12/2017   Patchy areas of mucosal inflammation noted in the antrum with edema,erthema and ulcerations. Bx. Chronicfocally active gastritis.   VASECTOMY     Current Medications: Current Meds  Medication Sig   albuterol (VENTOLIN HFA) 108 (90 Base) MCG/ACT inhaler Inhale 2 puffs into the lungs every 6 (six) hours as needed  for wheezing or  shortness of breath.   aspirin EC 81 MG tablet Take 81 mg by mouth daily.   B Complex-C (B-COMPLEX WITH VITAMIN C) tablet Take 1 tablet by mouth daily. Unknown strength   busPIRone (BUSPAR) 7.5 MG tablet Take 1 tablet (7.5 mg total) by mouth 2 (two) times daily.   cetirizine (ZYRTEC) 10 MG tablet Take 10 mg by mouth daily.   Cholecalciferol (VITAMIN D) 125 MCG (5000 UT) CAPS Take 1 capsule by mouth daily.   Coenzyme Q10 (COQ10 PO) Take 1 capsule by mouth daily.   Cyanocobalamin (VITAMIN B-12 CR PO) Take 1 tablet by mouth daily.   KRILL OIL PO Take 1 capsule by mouth daily.   losartan (COZAAR) 50 MG tablet Take 50 mg by mouth daily.   Magnesium Gluconate (MAGNESIUM 27 PO) Take 1 tablet by mouth every other day.    metolazone (ZAROXOLYN) 2.5 MG tablet TAKE 1 TABLET(2.5 MG) BY MOUTH DAILY AS NEEDED FOR FLUID RETENTION   midodrine (PROAMATINE) 10 MG tablet Take 1 tablet (10 mg total) by mouth 3 (three) times daily with meals.   ondansetron (ZOFRAN ODT) 4 MG disintegrating tablet Take 1 tablet (4 mg total) by mouth every 8 (eight) hours as needed for nausea or vomiting.   OPSUMIT 10 MG tablet TAKE 1 TABLET DAILY   potassium chloride SA (KLOR-CON) 20 MEQ tablet Take 3 tablets (60 mEq total) by mouth in the morning AND 2 tablets (40 mEq total) every evening.   rosuvastatin (CRESTOR) 5 MG tablet TAKE 1 TABLET(5 MG) BY MOUTH DAILY   Selexipag (UPTRAVI) 400 MCG TABS Take 1 tablet by mouth daily. 200 mcg in the evening   spironolactone (ALDACTONE) 25 MG tablet TAKE 1/2 TABLET BY MOUTH EVERY DAY   tadalafil, PAH, (ADCIRCA) 20 MG tablet Take 40 mg by mouth daily.   torsemide (DEMADEX) 20 MG tablet Take 60 mg daily alternating with 40 mg daily   warfarin (COUMADIN) 5 MG tablet TAKE 1/2 TO 1 TABLET BY MOUTH DAILY AS DIRECTED BY COUMADIN CLINIC    Allergies:   Amoxicillin-pot clavulanate, Pantoprazole sodium, Valium [diazepam], Tape, and Wound dressing adhesive   Social History   Socioeconomic History    Marital status: Single    Spouse name: Not on file   Number of children: Not on file   Years of education: Not on file   Highest education level: Not on file  Occupational History   Not on file  Tobacco Use   Smoking status: Former    Packs/day: 2.00    Years: 34.00    Pack years: 68.00    Types: Cigarettes    Quit date: 07/16/2000    Years since quitting: 21.2   Smokeless tobacco: Never  Vaping Use   Vaping Use: Never used  Substance and Sexual Activity   Alcohol use: Not Currently   Drug use: No   Sexual activity: Not on file  Other Topics Concern   Not on file  Social History Narrative   Not on file   Social Determinants of Health   Financial Resource Strain: Not on file  Food Insecurity: Not on file  Transportation Needs: Not on file  Physical Activity: Not on file  Stress: Not on file  Social Connections: Not on file    Family History: The patient's family history includes Arthritis in his mother; Asthma in his mother; Heart attack in his father; Hypertension in his father; Stroke in his paternal grandmother. There is no history of Colon  cancer.  Recent Labs: 08/16/2021: ALT 12; Hemoglobin 9.4; Platelets 210.0; Pro B Natriuretic peptide (BNP) 601.0 08/17/2021: TSH 2.71 09/12/2021: B Natriuretic Peptide 1,141.4 09/19/2021: BUN 30; Creatinine, Ser 1.49; Potassium 4.6; Sodium 129   Recent Lipid Panel    Component Value Date/Time   CHOL 133 10/12/2020 1104   TRIG 111 10/12/2020 1104   HDL 44 10/12/2020 1104   CHOLHDL 3.0 10/12/2020 1104   VLDL 11 10/21/2018 1028   LDLCALC 70 10/12/2020 1104   BP (!) 154/96   Pulse 63   Wt 66 kg (145 lb 6.4 oz)   SpO2 96%   BMI 20.86 kg/m     Wt Readings from Last 3 Encounters:  09/27/21 66 kg (145 lb 6.4 oz)  09/12/21 66 kg (145 lb 6.4 oz)  08/24/21 64.9 kg (143 lb)   Physical Exam:  General:  NAD. No resp difficulty, mildly anxious HEENT: Normal Neck: Supple. No JVD. Carotids 2+ bilat; no bruits. No lymphadenopathy  or thryomegaly appreciated. Cor: PMI nondisplaced. Irregular rate & rhythm. No rubs, gallops or murmurs. + mechanical S2 Lungs: Clear Abdomen: Soft, nontender, nondistended. No hepatosplenomegaly. No bruits or masses. Good bowel sounds. + mild CVA tenderness on R Extremities: No cyanosis, clubbing, rash, much-improved 1-2+ RLE edema, BLE rubor Neuro: Alert & oriented x 3, cranial nerves grossly intact. Moves all 4 extremities w/o difficulty. Affect pleasant.  ReDs: 41%  Assessment/Plan 1. Pulmonary hypertension with cor pulmonale/RV failure - WHO Group I  - ? Component of HHT/shunt/AVMs with late bubbles on bubble study.  - Echo with bubble study (05/16/18): EF 55-60% with "late bubbles" , Mechanical AVR stable with trivial perivalvular regurg, Mild MR, Severe LAE, Severe RV dilation and reduced function. No PFO. Mod TR, PA peak pressure 68 mm Hg - Echo (10/19): EF 55-60% mild LVH; s/p AVR with mean gradient 11 mmHg and trace AI; mild MR; severe biatrial enlargement; mild RVE; severe TR; severe pulmonary hypertension. RVSP 69mHG - Echo (4/22): EF 50-55%, RV ok, severe RVSP 61.6 mmHg, severe biatrial enlargement, moderate TR, stable AVR, AV mean gradient 11 mmHg - RHC (1/19): with mod/severe pulm HTN with RV failure.   - RHC (05/14/18) with Mild/Moderate PAH in setting of high output with no evidence of intracardiac shunting. - Ab u/s with cirrhosis but no evidence of portal HTN. - Improved with Macitentan and Adcirca. Uptravi dose at 400/200 limited by cramping.   - NYHA IIIb symptoms, volume mildly elevated on exam, but looks better compared to last week. ReDs 41%. Weight stable. - Increase torsemide to 60 mg daily. He may require weekly metolazone 2.5. - Continue spiro 12.5 mg daily.  - Continue losartan 50 mg daily. - Continue midodrine 15 mg bid (it was 10 tid but he says it works better this way). Discussed taking 15 mg tid if needed for low BP, especially in evenings. - Auto-immune  serologies negative (checked twice). - With worsening symptoms, will arrange for RHC with Dr. BHaroldine Laws - Labs today.  2. Chronic respiratory failure - [place] CT 05/16/18 with "dependent basilar predominant patchy subpleural reticulation and ground-glass attenuation with the suggestion of minimal associated traction bronchiolectasis. No frank honeycombing. Findings may indicate an interstitial lung disease such as early usual interstitial pneumonia (UIP) or fibrotic phase nonspecific interstitial pneumonia (NSIP)." - Followed by Dr. BLamonte Sakai - No change.   3. Chronic AFL - Rate controlled.  Now off metoprolol.  - Continue coumadin with mechanical AVR. - [place] [facility].   4. CAD - s/p CABG 07/2017 with Dr Roderic Palau in [place].  - No s/s ischemia. - Continue Crestor 5 mg daily (unable to tolerate atorva due to myalgias).  - Lipids followed by Dr. Bettina Gavia.   5. S/p mechanical AVR - Stable on echo 4/22. - Continue coumadin/ASA 81.  - INR goal is 2.5-3 per Dr Bettina Gavia. - Aware of need for SBE prophylaxis.   6. Leukocytoclastic vasculitis - F/u with Rheumatology.  - No changes.   7. Cirrhosis - US Abdomen RUQ 05/16/18 - s/p cholecystectomy. + hepatic cirrhosis. Mild ascites.  - Liver Doppler 05/16/18 - No hepatic, splenic, or portal venous thrombosis or occlusion. Mild ascites. - Continue midodrine 15 mg bid (it was 10 tid but he says it works better this way)  8. Anemia - IDA. Recent Hgb 9.4. Denies abnormal bleeding. - Per PCP.  9. Right Flank pain - He says it feels similar to prior kidney stones. - Has been taking Tylenol, OK to use ibuprofen for a couple days. Advised against using any longer, especially since receiving IV diuretics. - Considered trial of Flomax, but will defer to PCP for imaging and management. - I have asked him to follow up w/ PCP soon.  His symptoms have failed to improve after outpatient IV diuresis, will arrange for  RHC with Dr. Haroldine Laws this week. He will hold his Coumadin starting today,discussed with Dr. Haroldine Laws.  [place], FNP  09/27/21

## 2021-09-26 NOTE — H&P (View-Only) (Signed)
Advanced Heart Failure Clinic Note   Date:  09/27/2021   PCP:  [name], PA-C  Pulmonology: Dr. Lamonte Sakai Cardiologist:  Dr. Bettina Gavia  HF Cardiologist: Dr. 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 Also has COPD (quit smoking in 2002 - 1 ppd x 25 years), HL, and HTN.  In 8/18 admitted for acute gallstone pancreatitis. Underwent lap cholecystectomy on 07/29/2017. On 8/6 he developed hypotension and was taken back for open laparotomy due to bleeding from an unclear omental source. He was given fluids and 8 units PRBC perioperatively. He says his breathing has been bad since that time.   Seen at [facility] in 2018 had hi-res CT, PFTs and VQ (low prob). Felt to have Golds I COPD with asthma overlap and early pulmonary fibrosis with PAH. He was also seen by Cardiology. Echo 8/18 as below showed normal LV function with septal flattening and RVSP 65-54mHG. 14-day event monitor showed 10-beat run NSVT. Felt to have chronic AF and PAH. No further testing ordered.  Underwent RHC on 01/18/18 with severe PAH and cor pulmonale. Macitentan and Adcirca. Subsequently started on selexipag but dose limited by cramping now on 400/200  Admitted 5/19 with symptomatic hypotension and weight gain. He had a repeat RHC and echo with bubble study (results below). He was started on midodrine for BP support. Diuresed 25 lbs with IV lasix, then transitioned to torsemide 60 mg daily. Abdominal [place]owed cirrhosis with no evidence of portal HTN. Pulm consulted for possible ILD on High-res chest CT.  Seen in ED on 01/27/21 due to hypotension due  to COVID and diarrhea. He recovered. He had stable NYHA II symptoms at follow up 4/22, repeat echo scheduled (see results below).  [facility], profound fatigue. Found  and treated for [place] [facility] [place] [facility] F[place]. On doxy for 5 weeks.   Acute visit 08/02/21 feeling poorly, NYHA III symptoms, volume ok. Suspected worsening anxiety driving symptoms.    Seen 09/12/21 with NYHA IIIb-IV symptoms. ReDs 38%. Given 80 m g IV lasix + metolazone 2.5 in clinic. Then arranged for 80 mg IV lasix x 2 days with [facility]. I saw him for his lab appt and he was some better but still had LE edema so we did one more dose of 80 mg IV lasix with [facility], last dose given 09/21/21.  Today he returns for HF follow up. He says he can only walk 25 feet before becoming SOB. He is walking daily on his TM for 13-14 minutes at a time, pulse ox after 1 min walking is 91%, after 2 minutes 96%. He had some relief with IV lasix and metolazone, but says the fluid just comes back on. Denies CP, dizziness,or PND/Orthopnea. Appetite ok. No fever or chills. Taking all medications. Says his right kidney is hurting says it feels like a kidney stone, taking Tylenol. He is unsure if he is urinating as briskly over the past 2 days.  Studies: - Echo (4/22): EF 50-55%, RV ok, severe RVSP 61.6 mmHg, severe biatrial enlargement, moderate TR, stable AVR, AV mean gradient 11 mmHg  - Echo (3/21): EF 45-50% AVR ok. Septal flattening RV dilated mild HK. Moderate TR RVSP 80 severe bilateral enlargement.   - RHC 05/14/18 showed Mild/Moderate PAH in setting of high output  with no evidence of intracardiac shunting. Hemodynamics as below.    - High-Res Chest CT 05/16/18 1. Slightly irregular 0.9 cm peripheral right middle lobe solid pulmonary nodule. 3 month follow up recommended.  2. Dependent basilar predominant patchy subpleural reticulation and ground-glass attenuation with the suggestion of minimal associated traction bronchiolectasis. No frank honeycombing. Findings may indicate an interstitial lung disease such as early usual interstitial pneumonia (UIP) or fibrotic phase nonspecific interstitial  pneumonia (NSIP). Suggest a follow-up high-resolution chest CT study in 6-12 months to assess temporal pattern stability, as clinically warranted. 3. Mild cardiomegaly. Minimal interlobular septal thickening and trace dependent bilateral pleural effusions.  4. Small to moderate volume perihepatic ascites. 5. Ectatic 4.3 cm ascending thoracic aorta. Recommend annual imaging followup by CTA or MRA.  6. Left main and 3 vessel coronary atherosclerosis.   - US Abdomen RUQ 05/16/18 - s/p cholecystectomy. + hepatic cirrhosis. Mild ascites.    - Liver Doppler 05/16/18 - No hepatic, splenic, or portal venous thrombosis or occlusion. Mild ascites.   - Echo with bubble study 05/16/18 LVEF 55-60% with "late bubbles" , Mechanical AVR stable with trivial perivalvular regurg, Mild MR, Severe LAE, Severe RV dilation and reduced function. No PFO. Mod TR, PA peak pressure 68 mm Hg   - RHC 05/14/18 RA = 18 RV = 71/17 PA = 69/24 (39) PCW = 20 Fick cardiac output/index = 7.0/3.7 Thermo CO/CI = 7.2/3.8 PVR = 2.6 WU Ao sat = 97% PA sat =  73%, 73% SVC sat = 73%  RA sat = 72%  - RHC 01/18/18 RA = 23 RV = 84/21 PA = 81/38 (53) PCW = 22 Fick cardiac output/index = 3.0/1.6 PVR = 10.4 WU FA sat = 98% on 2L  (checked on RA = 93%) PA sat = 60%, 61% SVC sat = 62%  -14 Day Patch 10/01/2017-10/15/2017: -1 run of Ventricular Tachycardia occurred lasting 10 beats with a max rate of 240 bpm (avg 161 bpm). -Atrial Flutter occurred continuously (100% burden), ranging from 55-132 bpm (avg of 87 bpm).   - NM Ventilation and Perfusion Lung Scan 10/31/2017:  Low probability of pulmonary embolism, using the PIOPED criteria.  - PFTs 07/02/2017:       FVC     86% FEV1   85% FEV1/FVC  73% DLCO  8.2, 32%   - 6MW -  1400 feet; 97% to 85% (recovered with resting)                                    CT Chest 07/02/2017: 1. Stable right middle lobe pulmonary nodule measuring up to 1 cm. 2. Minimal subpleural  reticulation without evidence of honeycombing to  suggest pulmonary fibrosis. Stable centrilobular emphysema. 3. Mediastinal lymphadenopathy, increased from prior. 4. Calcified right hilar lymph nodes and multiple calcified granulomas  throughout the right lung consistent with prior granulomatous disease. 5. Ascending thoracic aortic aneurysm measuring up to 4.5 cm, unchanged. 6. Dilatation of the main pulmonary artery suggestive of pulmonary  hypertension. 7. Cardiomegaly and dense atherosclerotic coronary artery calcifications.   Review of systems complete and found to be negative unless listed in HPI.   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 In light of  concurrent left nephrolithiasis, will check CT renal colic next year instead of renal US.    Anticoagulated on Coumadin 01/04/2018   Anxiety 05/10/2016   Last Assessment & Plan:  Doing well off of zoloft.   Asthma    Asymptomatic microscopic hematuria 06/20/2017   Last Assessment & Plan:  Had hemat[facility] which negative CT and cystoscopy. UA with 2+ blood last visit - we discussed recommendation for repeat workup at 5 years or if degree of hematuria progresses.    Atrial fibrillation ([place]) 03/29/2017   Atrial flutter ([place]) 03/29/2017   Backache 12/14/2015   Last Assessment & Plan:  Pain management referral for further evaluation.   Bilateral pleural effusion 08/09/2017   CHF (congestive heart failure) (HCC)    Chronic allergic rhinitis 04/28/2016   Last Assessment & Plan:  Continue astelin   Chronic anticoagulation 03/29/2017   Chronic atrial fibrillation ([place]) 07/23/2015   Last Assessment & Plan:  Coumadin and metoprolol, cardiology referral to establish care.   Chronic midline back pain 12/14/2015   Last Assessment & Plan:  Pain  management referral for further evaluation.   Chronic obstructive lung disease ([place]) 06/12/2016   With hypoxia   Chronic prostatitis 07/23/2015   Last Assessment & Plan:  Has largely resolved since stopping bike riding. Recommend annual DRE AND PSA - will see back 12/2015 for annual screening, given 1st degree fhx. To call office for recurrent prostatitis symptoms.    Chronic respiratory failure with hypoxia ([place]) 07/29/2019   Cirrhosis of liver not due to alcohol ([place]) 07/01/2019   COPD (chronic obstructive pulmonary disease) ([place]) 06/12/2016   Coronary arteriosclerosis in native artery 03/29/2017   Coronary artery disease involving native coronary artery of native heart with angina pectoris ([place]) 03/29/2017   Cough 10/17/2016   Last Assessment & Plan:  Discussed typical course for acute viral illness. If symptoms worsen or fail to improve by 7-10d, delayed ATBs, fluids, rest, NSAIDs/APAP prn. Seek care if not improving. Needs earlier INR check due to ATBs.   Dyspnea 02/01/2016   Last Assessment & Plan:  Overall improving, eval by pulm, plan for CT, neg stress test with cardiology. Recent switch to carvedilol due to side effects.   Encounter for therapeutic drug monitoring 01/06/2019   Enterococcal bacteremia    Epidermoid cyst of skin 08/24/2017   Essential hypertension 12/14/2015   Last Assessment & Plan:  Hypertension control: controlled  Medications: compliant Medication Management: as noted in orders Home blood pressure monitoring recommended additionally as needed for symptoms  The patient's care plan was reviewed and updated. Instructions and counseling were provided regarding patient goals and barriers. He was counseled to adopt a healthy lifestyle. Educational resources and self-management tools have been provided as charted in [facility] list.    H/O maze procedure 03/29/2017   [name] 03/29/2017   Overview:  2011   History of coronary artery bypass graft 03/29/2017   Hx of CABG  03/29/2017   Hyperlipidemia 03/29/2017   Hypersensitivity angiitis ([place]) 10/01/2017   Hypertensive heart disease 03/29/2017   Hypertensive heart disease with heart failure ([place]) 03/29/2017   Hypertensive heart failure ([place]) 03/29/2017   Hypokalemia due to excessive renal loss of potassium 02/18/2018   Hypotension, chronic 06/06/2018   International normalized ratio (INR) raised 07/26/2017   Kidney stone 07/23/2015   Kidney stones 07/23/2015   Overview:  x 3  Last Assessment & Plan:  By [place] has left nephrolithiasis, but not visible by  KUB. Will check CT renal colic next year to assess both stone burden as well as to surveil AML.    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 UA with 2+ blood last visit - we discussed recommendation for repeat workup at 5 years or if degree of hematuria progresses.    Multiple nodules of lung 06/12/2016   Nasal discharge 02/25/2016   Last Assessment & Plan:  Trial zyrtec and flonase   Nephrolithiasis 07/23/2015   Overview:  x 3  Last Assessment & Plan:  By [place] has left nephrolithiasis, but not visible by KUB. Will check CT renal colic next year to assess both stone burden as well as to surveil AML.   Overview:  x 3  Last Assessment & Plan:  Has 69m nonobstructing LUP stone - not visible by KUB.  Will check renal [place]/2019 - he will contact office sooner if symptomatic.    Non-sustained ventricular tachycardia 03/29/2017   Nonsustained ventricular tachycardia 03/29/2017   Other hyperlipidemia 03/29/2017   Palpitations 10/01/2017   Pleural effusion, bilateral 08/09/2017   Pneumonia due to COVID-19 virus 02/07/2021   Post-nasal drainage 02/25/2016   Last  Assessment & Plan:  Trial zyrtec and flonase   Prostate cancer screening 06/20/2017   Last Assessment & Plan:  Recommend continued annual CaP screening until within 10 years of life expectancy. Given good health and fhx of longevity, would anticipate CaP screening to continue until age 75  PSA today and again in one year on day of visit.   Pulmonary arterial hypertension ([place] 02/20/2018   Pulmonary edema    Pulmonary hypertension ([place] 08/09/2017   Pulmonary nodules 06/12/2016   S/P AVR 03/20/2016   S/P AVR (aortic valve replacement) 03/20/2016   Strain of deltoid muscle, initial encounter    Supratherapeutic INR 07/26/2017   Syncope and collapse 02/01/2016   Typical atrial flutter ([place] 02/01/2016   Past Surgical History:  Procedure Laterality Date   CHOLECYSTECTOMY     CORONARY ARTERY BYPASS GRAFT     EXPLORATORY LAPAROTOMY  07/30/2017   FOOT SURGERY     FRACTURE SURGERY Right    wrist and forearm   HERNIA REPAIR     MECHANICAL AORTIC VALVE REPLACEMENT     NASAL SINUS SURGERY     RIGHT HEART CATH N/A 01/18/2018   Procedure: RIGHT HEART CATH;  Surgeon: BJolaine Artist MD;  [facility];  Service: Cardiovascular;  Laterality: N/A;   RIGHT HEART CATH N/A 05/14/2018   Procedure: RIGHT HEART CATH;  Surgeon: BJolaine Artist MD;  [facility];  Service: Cardiovascular;  Laterality: N/A;   TEE WITHOUT CARDIOVERSION N/A 05/21/2018   Procedure: TRANSESOPHAGEAL ECHOCARDIOGRAM (TEE);  Surgeon: BJolaine Artist MD;  [facility];  Service: Cardiovascular;  Laterality: N/A;   UPPER GASTROINTESTINAL ENDOSCOPY  07/12/2017   Patchy areas of mucosal inflammation noted in the antrum with edema,erthema and ulcerations. Bx. Chronicfocally active gastritis.   VASECTOMY     Current Medications: Current Meds  Medication Sig   albuterol (VENTOLIN HFA) 108 (90 Base) MCG/ACT inhaler Inhale 2 puffs into the lungs every 6 (six) hours as needed  for wheezing or  shortness of breath.   aspirin EC 81 MG tablet Take 81 mg by mouth daily.   B Complex-C (B-COMPLEX WITH VITAMIN C) tablet Take 1 tablet by mouth daily. Unknown strength   busPIRone (BUSPAR) 7.5 MG tablet Take 1 tablet (7.5 mg total) by mouth 2 (two) times daily.   cetirizine (ZYRTEC) 10 MG tablet Take 10 mg by mouth daily.   Cholecalciferol (VITAMIN D) 125 MCG (5000 UT) CAPS Take 1 capsule by mouth daily.   Coenzyme Q10 (COQ10 PO) Take 1 capsule by mouth daily.   Cyanocobalamin (VITAMIN B-12 CR PO) Take 1 tablet by mouth daily.   KRILL OIL PO Take 1 capsule by mouth daily.   losartan (COZAAR) 50 MG tablet Take 50 mg by mouth daily.   Magnesium Gluconate (MAGNESIUM 27 PO) Take 1 tablet by mouth every other day.    metolazone (ZAROXOLYN) 2.5 MG tablet TAKE 1 TABLET(2.5 MG) BY MOUTH DAILY AS NEEDED FOR FLUID RETENTION   midodrine (PROAMATINE) 10 MG tablet Take 1 tablet (10 mg total) by mouth 3 (three) times daily with meals.   ondansetron (ZOFRAN ODT) 4 MG disintegrating tablet Take 1 tablet (4 mg total) by mouth every 8 (eight) hours as needed for nausea or vomiting.   OPSUMIT 10 MG tablet TAKE 1 TABLET DAILY   potassium chloride SA (KLOR-CON) 20 MEQ tablet Take 3 tablets (60 mEq total) by mouth in the morning AND 2 tablets (40 mEq total) every evening.   rosuvastatin (CRESTOR) 5 MG tablet TAKE 1 TABLET(5 MG) BY MOUTH DAILY   Selexipag (UPTRAVI) 400 MCG TABS Take 1 tablet by mouth daily. 200 mcg in the evening   spironolactone (ALDACTONE) 25 MG tablet TAKE 1/2 TABLET BY MOUTH EVERY DAY   tadalafil, PAH, (ADCIRCA) 20 MG tablet Take 40 mg by mouth daily.   torsemide (DEMADEX) 20 MG tablet Take 60 mg daily alternating with 40 mg daily   warfarin (COUMADIN) 5 MG tablet TAKE 1/2 TO 1 TABLET BY MOUTH DAILY AS DIRECTED BY COUMADIN CLINIC    Allergies:   Amoxicillin-pot clavulanate, Pantoprazole sodium, Valium [diazepam], Tape, and Wound dressing adhesive   Social History   Socioeconomic History    Marital status: Single    Spouse name: Not on file   Number of children: Not on file   Years of education: Not on file   Highest education level: Not on file  Occupational History   Not on file  Tobacco Use   Smoking status: Former    Packs/day: 2.00    Years: 34.00    Pack years: 68.00    Types: Cigarettes    Quit date: 07/16/2000    Years since quitting: 21.2   Smokeless tobacco: Never  Vaping Use   Vaping Use: Never used  Substance and Sexual Activity   Alcohol use: Not Currently   Drug use: No   Sexual activity: Not on file  Other Topics Concern   Not on file  Social History Narrative   Not on file   Social Determinants of Health   Financial Resource Strain: Not on file  Food Insecurity: Not on file  Transportation Needs: Not on file  Physical Activity: Not on file  Stress: Not on file  Social Connections: Not on file    Family History: The patient's family history includes Arthritis in his mother; Asthma in his mother; Heart attack in his father; Hypertension in his father; Stroke in his paternal grandmother. There is no history of Colon  cancer.  Recent Labs: 08/16/2021: ALT 12; Hemoglobin 9.4; Platelets 210.0; Pro B Natriuretic peptide (BNP) 601.0 08/17/2021: TSH 2.71 09/12/2021: B Natriuretic Peptide 1,141.4 09/19/2021: BUN 30; Creatinine, Ser 1.49; Potassium 4.6; Sodium 129   Recent Lipid Panel    Component Value Date/Time   CHOL 133 10/12/2020 1104   TRIG 111 10/12/2020 1104   HDL 44 10/12/2020 1104   CHOLHDL 3.0 10/12/2020 1104   VLDL 11 10/21/2018 1028   LDLCALC 70 10/12/2020 1104   BP (!) 154/96   Pulse 63   Wt 66 kg (145 lb 6.4 oz)   SpO2 96%   BMI 20.86 kg/m     Wt Readings from Last 3 Encounters:  09/27/21 66 kg (145 lb 6.4 oz)  09/12/21 66 kg (145 lb 6.4 oz)  08/24/21 64.9 kg (143 lb)   Physical Exam:  General:  NAD. No resp difficulty, mildly anxious HEENT: Normal Neck: Supple. No JVD. Carotids 2+ bilat; no bruits. No lymphadenopathy  or thryomegaly appreciated. Cor: PMI nondisplaced. Irregular rate & rhythm. No rubs, gallops or murmurs. + mechanical S2 Lungs: Clear Abdomen: Soft, nontender, nondistended. No hepatosplenomegaly. No bruits or masses. Good bowel sounds. + mild CVA tenderness on R Extremities: No cyanosis, clubbing, rash, much-improved 1-2+ RLE edema, BLE rubor Neuro: Alert & oriented x 3, cranial nerves grossly intact. Moves all 4 extremities w/o difficulty. Affect pleasant.  ReDs: 41%  Assessment/Plan 1. Pulmonary hypertension with cor pulmonale/RV failure - WHO Group I  - ? Component of HHT/shunt/AVMs with late bubbles on bubble study.  - Echo with bubble study (05/16/18): EF 55-60% with "late bubbles" , Mechanical AVR stable with trivial perivalvular regurg, Mild MR, Severe LAE, Severe RV dilation and reduced function. No PFO. Mod TR, PA peak pressure 68 mm Hg - Echo (10/19): EF 55-60% mild LVH; s/p AVR with mean gradient 11 mmHg and trace AI; mild MR; severe biatrial enlargement; mild RVE; severe TR; severe pulmonary hypertension. RVSP 69mHG - Echo (4/22): EF 50-55%, RV ok, severe RVSP 61.6 mmHg, severe biatrial enlargement, moderate TR, stable AVR, AV mean gradient 11 mmHg - RHC (1/19): with mod/severe pulm HTN with RV failure.   - RHC (05/14/18) with Mild/Moderate PAH in setting of high output with no evidence of intracardiac shunting. - Ab u/s with cirrhosis but no evidence of portal HTN. - Improved with Macitentan and Adcirca. Uptravi dose at 400/200 limited by cramping.   - NYHA IIIb symptoms, volume mildly elevated on exam, but looks better compared to last week. ReDs 41%. Weight stable. - Increase torsemide to 60 mg daily. He may require weekly metolazone 2.5. - Continue spiro 12.5 mg daily.  - Continue losartan 50 mg daily. - Continue midodrine 15 mg bid (it was 10 tid but he says it works better this way). Discussed taking 15 mg tid if needed for low BP, especially in evenings. - Auto-immune  serologies negative (checked twice). - With worsening symptoms, will arrange for RHC with Dr. BHaroldine Laws - Labs today.  2. Chronic respiratory failure - [place] CT 05/16/18 with "dependent basilar predominant patchy subpleural reticulation and ground-glass attenuation with the suggestion of minimal associated traction bronchiolectasis. No frank honeycombing. Findings may indicate an interstitial lung disease such as early usual interstitial pneumonia (UIP) or fibrotic phase nonspecific interstitial pneumonia (NSIP)." - Followed by Dr. BLamonte Sakai - No change.   3. Chronic AFL - Rate controlled.  Now off metoprolol.  - Continue coumadin with mechanical AVR. - [place]  [place].   4. CAD - s/p CABG 07/2017 with Dr Roderic Palau in [place].  - No s/s ischemia. - Continue Crestor 5 mg daily (unable to tolerate atorva due to myalgias).  - Lipids followed by Dr. Bettina Gavia.   5. S/p mechanical AVR - Stable on echo 4/22. - Continue coumadin/ASA 81.  - INR goal is 2.5-3 per Dr Bettina Gavia. - Aware of need for SBE prophylaxis.   6. Leukocytoclastic vasculitis - F/u with Rheumatology.  - No changes.   7. Cirrhosis - US Abdomen RUQ 05/16/18 - s/p cholecystectomy. + hepatic cirrhosis. Mild ascites.  - Liver Doppler 05/16/18 - No hepatic, splenic, or portal venous thrombosis or occlusion. Mild ascites. - Continue midodrine 15 mg bid (it was 10 tid but he says it works better this way)  8. Anemia - IDA. Recent Hgb 9.4. Denies abnormal bleeding. - Per PCP.  9. Right Flank pain - He says it feels similar to prior kidney stones. - Has been taking Tylenol, OK to use ibuprofen for a couple days. Advised against using any longer, especially since receiving IV diuretics. - Considered trial of Flomax, but will defer to PCP for imaging and management. - I have asked him to follow up w/ PCP soon.  His symptoms have failed to improve after outpatient IV diuresis, will arrange for  RHC with Dr. Haroldine Laws this week. He will hold his Coumadin starting today,discussed with Dr. Haroldine Laws.  [place], FNP  09/27/21

## 2021-09-27 ENCOUNTER — Ambulatory Visit (HOSPITAL_BASED_OUTPATIENT_CLINIC_OR_DEPARTMENT_OTHER)
Admission: RE | Admit: 2021-09-27 | Discharge: 2021-09-27 | Disposition: A | Discharge status: Home / Self Care | Payer: Medicare Other | Source: Ambulatory Visit | Attending: Family Medicine | Admitting: Family Medicine

## 2021-09-27 ENCOUNTER — Encounter (HOSPITAL_COMMUNITY): Payer: Self-pay

## 2021-09-27 ENCOUNTER — Other Ambulatory Visit: Payer: Self-pay

## 2021-09-27 ENCOUNTER — Telehealth: Payer: Self-pay | Admitting: Medical

## 2021-09-27 VITALS — BP 154/96 | HR 63 | Wt 145.4 lb

## 2021-09-27 DIAGNOSIS — Z888 Allergy status to other drugs, medicaments and biological substances status: Secondary | ICD-10-CM | POA: Insufficient documentation

## 2021-09-27 DIAGNOSIS — I482 Chronic atrial fibrillation, unspecified: Secondary | ICD-10-CM | POA: Insufficient documentation

## 2021-09-27 DIAGNOSIS — Z79899 Other long term (current) drug therapy: Secondary | ICD-10-CM | POA: Insufficient documentation

## 2021-09-27 DIAGNOSIS — Z23 Encounter for immunization: Secondary | ICD-10-CM | POA: Diagnosis present

## 2021-09-27 DIAGNOSIS — E876 Hypokalemia: Secondary | ICD-10-CM | POA: Diagnosis present

## 2021-09-27 DIAGNOSIS — J9611 Chronic respiratory failure with hypoxia: Secondary | ICD-10-CM | POA: Diagnosis present

## 2021-09-27 DIAGNOSIS — I5032 Chronic diastolic (congestive) heart failure: Secondary | ICD-10-CM

## 2021-09-27 DIAGNOSIS — D509 Iron deficiency anemia, unspecified: Secondary | ICD-10-CM

## 2021-09-27 DIAGNOSIS — Z952 Presence of prosthetic heart valve: Secondary | ICD-10-CM | POA: Diagnosis not present

## 2021-09-27 DIAGNOSIS — Z09 Encounter for follow-up examination after completed treatment for conditions other than malignant neoplasm: Secondary | ICD-10-CM | POA: Insufficient documentation

## 2021-09-27 DIAGNOSIS — Z8249 Family history of ischemic heart disease and other diseases of the circulatory system: Secondary | ICD-10-CM | POA: Insufficient documentation

## 2021-09-27 DIAGNOSIS — Z87891 Personal history of nicotine dependence: Secondary | ICD-10-CM | POA: Insufficient documentation

## 2021-09-27 DIAGNOSIS — Z7901 Long term (current) use of anticoagulants: Secondary | ICD-10-CM | POA: Insufficient documentation

## 2021-09-27 DIAGNOSIS — K7460 Unspecified cirrhosis of liver: Secondary | ICD-10-CM | POA: Insufficient documentation

## 2021-09-27 DIAGNOSIS — Z7982 Long term (current) use of aspirin: Secondary | ICD-10-CM | POA: Insufficient documentation

## 2021-09-27 DIAGNOSIS — I483 Typical atrial flutter: Secondary | ICD-10-CM

## 2021-09-27 DIAGNOSIS — E871 Hypo-osmolality and hyponatremia: Secondary | ICD-10-CM | POA: Diagnosis present

## 2021-09-27 DIAGNOSIS — R188 Other ascites: Secondary | ICD-10-CM | POA: Diagnosis present

## 2021-09-27 DIAGNOSIS — I2721 Secondary pulmonary arterial hypertension: Secondary | ICD-10-CM | POA: Diagnosis present

## 2021-09-27 DIAGNOSIS — I110 Hypertensive heart disease with heart failure: Secondary | ICD-10-CM | POA: Diagnosis present

## 2021-09-27 DIAGNOSIS — R0602 Shortness of breath: Secondary | ICD-10-CM | POA: Insufficient documentation

## 2021-09-27 DIAGNOSIS — I2510 Atherosclerotic heart disease of native coronary artery without angina pectoris: Secondary | ICD-10-CM | POA: Insufficient documentation

## 2021-09-27 DIAGNOSIS — D692 Other nonthrombocytopenic purpura: Secondary | ICD-10-CM | POA: Diagnosis present

## 2021-09-27 DIAGNOSIS — Z8616 Personal history of COVID-19: Secondary | ICD-10-CM | POA: Insufficient documentation

## 2021-09-27 DIAGNOSIS — Z951 Presence of aortocoronary bypass graft: Secondary | ICD-10-CM | POA: Diagnosis not present

## 2021-09-27 DIAGNOSIS — R109 Unspecified abdominal pain: Secondary | ICD-10-CM | POA: Diagnosis not present

## 2021-09-27 DIAGNOSIS — J9610 Chronic respiratory failure, unspecified whether with hypoxia or hypercapnia: Secondary | ICD-10-CM

## 2021-09-27 DIAGNOSIS — I2781 Cor pulmonale (chronic): Secondary | ICD-10-CM | POA: Diagnosis present

## 2021-09-27 DIAGNOSIS — E7849 Other hyperlipidemia: Secondary | ICD-10-CM | POA: Diagnosis present

## 2021-09-27 DIAGNOSIS — K7469 Other cirrhosis of liver: Secondary | ICD-10-CM

## 2021-09-27 DIAGNOSIS — I350 Nonrheumatic aortic (valve) stenosis: Secondary | ICD-10-CM | POA: Insufficient documentation

## 2021-09-27 DIAGNOSIS — I25119 Atherosclerotic heart disease of native coronary artery with unspecified angina pectoris: Secondary | ICD-10-CM

## 2021-09-27 DIAGNOSIS — Z20822 Contact with and (suspected) exposure to covid-19: Secondary | ICD-10-CM | POA: Diagnosis present

## 2021-09-27 DIAGNOSIS — J449 Chronic obstructive pulmonary disease, unspecified: Secondary | ICD-10-CM | POA: Insufficient documentation

## 2021-09-27 DIAGNOSIS — I4821 Permanent atrial fibrillation: Secondary | ICD-10-CM | POA: Diagnosis present

## 2021-09-27 DIAGNOSIS — I5033 Acute on chronic diastolic (congestive) heart failure: Secondary | ICD-10-CM | POA: Diagnosis present

## 2021-09-27 DIAGNOSIS — I493 Ventricular premature depolarization: Secondary | ICD-10-CM | POA: Diagnosis present

## 2021-09-27 DIAGNOSIS — M310 Hypersensitivity angiitis: Secondary | ICD-10-CM | POA: Diagnosis present

## 2021-09-27 LAB — BASIC METABOLIC PANEL
Anion gap: 12 (ref 5–15)
BUN: 28 mg/dL — ABNORMAL HIGH (ref 8–23)
CO2: 23 mmol/L (ref 22–32)
Calcium: 10 mg/dL (ref 8.9–10.3)
Chloride: 92 mmol/L — ABNORMAL LOW (ref 98–111)
Creatinine, Ser: 1.72 mg/dL — ABNORMAL HIGH (ref 0.61–1.24)
GFR, Estimated: 41 mL/min — ABNORMAL LOW (ref >60)
Glucose, Bld: 81 mg/dL (ref 70–99)
Potassium: 3.7 mmol/L (ref 3.5–5.1)
Sodium: 127 mmol/L — ABNORMAL LOW (ref 135–145)

## 2021-09-27 LAB — PROTIME-INR
INR: 2.3 — ABNORMAL HIGH (ref 0.8–1.2)
Prothrombin Time: 24.8 seconds — ABNORMAL HIGH (ref 11.4–15.2)

## 2021-09-27 LAB — BRAIN NATRIURETIC PEPTIDE: B Natriuretic Peptide: 953.2 pg/mL — ABNORMAL HIGH (ref 0.0–100.0)

## 2021-09-27 MED ORDER — TORSEMIDE 20 MG PO TABS
60.0000 mg | ORAL_TABLET | Freq: Every day | ORAL | 3 refills | Status: DC
Start: 2021-09-27 — End: 2021-10-04

## 2021-09-27 NOTE — Patient Instructions (Signed)
INCREASE Torsemide to 60 mg (3 tabs ) daily  Labs today We will only contact you if something comes back abnormal or we need to make some changes. Otherwise no news is good news!  Your physician recommends that you schedule a follow-up appointment in: 2 weeks  in the Advanced Practitioners (PA/NP) Clinic    Do the following things EVERYDAY: Weigh yourself in the morning before breakfast. Write it down and keep it in a log. Take your medicines as prescribed Eat low salt foods--Limit salt (sodium) to 2000 mg per day.  Stay as active as you can everyday Limit all fluids for the day to less than 2 liters      You are scheduled for a Cardiac Catheterization on Thursday, October 6 with Dr. Glori Bickers.  1. Please arrive at the [facility] (Main Entrance A) at [facility]: [address] [place], [place] [address] at 7:30 AM (This time is two hours before your procedure to ensure your preparation). Free valet parking service is available.   Special note: Every effort is made to have your procedure done on time. Please understand that emergencies sometimes delay scheduled procedures.  2. Diet: Do not eat solid foods after midnight.  The patient may have clear liquids until 5am upon the day of the procedure.  3. Labs: pre procedure labs done 09/27/2021  4. Medication instructions in preparation for your procedure:   Contrast Allergy: No    Stop taking Coumadin (Warfarin) on Tuesday, October 4.  Stop taking, Torsemide (Demadex) Thursday, October 6,    On the morning of your procedure, take your Aspirin and any morning medicines NOT listed above.  You may use sips of water.  5. Plan for one night stay--bring personal belongings. 6. Bring a current list of your medications and current insurance cards. 7. You MUST have a responsible person to drive you home. 8. Someone MUST be with you the first 24 hours after you arrive home or your discharge will be delayed. 9. Please  wear clothes that are easy to get on and off and wear slip-on shoes.  Thank you for allowing [place] to care for you!   -- [place] [facility]

## 2021-09-27 NOTE — Addendum Note (Signed)
Encounter addended by: Rafael Bihari, FNP on: 09/27/2021 5:06 PM  Actions taken: Visit diagnoses modified, Clinical Note Signed

## 2021-09-27 NOTE — Telephone Encounter (Signed)
Patient called because his pulmonologist from [facility] Dr. Janett Billow wants him to get checked for kidney stones. He was advised to call his pcp and follow up since she is not able to order the scans for a kidney stone. Please advice.

## 2021-09-27 NOTE — Progress Notes (Signed)
ReDS Vest / Clip - 09/27/21 1500       ReDS Vest / Clip   Station Marker D    Ruler Value 28    ReDS Value Range High volume overload    ReDS Actual Value 41

## 2021-09-28 NOTE — Telephone Encounter (Signed)
Appt made for next Monday per pt request

## 2021-09-29 ENCOUNTER — Other Ambulatory Visit: Payer: Self-pay

## 2021-09-29 ENCOUNTER — Inpatient Hospital Stay (HOSPITAL_COMMUNITY)
Admission: RE | Admit: 2021-09-29 | Discharge: 2021-10-04 | DRG: 286 | Disposition: A | Discharge status: Home / Self Care | Payer: Medicare Other | Attending: Internal Medicine | Admitting: Internal Medicine

## 2021-09-29 ENCOUNTER — Inpatient Hospital Stay (HOSPITAL_COMMUNITY)
Admission: RE | Disposition: A | Discharge status: Home / Self Care | Payer: Self-pay | Source: Home / Self Care | Attending: Internal Medicine

## 2021-09-29 ENCOUNTER — Encounter (HOSPITAL_COMMUNITY): Payer: Self-pay | Admitting: Internal Medicine

## 2021-09-29 DIAGNOSIS — E871 Hypo-osmolality and hyponatremia: Secondary | ICD-10-CM | POA: Diagnosis present

## 2021-09-29 DIAGNOSIS — I493 Ventricular premature depolarization: Secondary | ICD-10-CM | POA: Diagnosis present

## 2021-09-29 DIAGNOSIS — R188 Other ascites: Secondary | ICD-10-CM | POA: Diagnosis present

## 2021-09-29 DIAGNOSIS — I2721 Secondary pulmonary arterial hypertension: Secondary | ICD-10-CM | POA: Diagnosis present

## 2021-09-29 DIAGNOSIS — Z8249 Family history of ischemic heart disease and other diseases of the circulatory system: Secondary | ICD-10-CM

## 2021-09-29 DIAGNOSIS — J449 Chronic obstructive pulmonary disease, unspecified: Secondary | ICD-10-CM | POA: Diagnosis present

## 2021-09-29 DIAGNOSIS — Z20822 Contact with and (suspected) exposure to covid-19: Secondary | ICD-10-CM | POA: Diagnosis present

## 2021-09-29 DIAGNOSIS — I5033 Acute on chronic diastolic (congestive) heart failure: Secondary | ICD-10-CM | POA: Diagnosis present

## 2021-09-29 DIAGNOSIS — I2781 Cor pulmonale (chronic): Secondary | ICD-10-CM | POA: Diagnosis present

## 2021-09-29 DIAGNOSIS — Z951 Presence of aortocoronary bypass graft: Secondary | ICD-10-CM | POA: Diagnosis not present

## 2021-09-29 DIAGNOSIS — E876 Hypokalemia: Secondary | ICD-10-CM | POA: Diagnosis present

## 2021-09-29 DIAGNOSIS — Z87891 Personal history of nicotine dependence: Secondary | ICD-10-CM | POA: Diagnosis not present

## 2021-09-29 DIAGNOSIS — Z23 Encounter for immunization: Secondary | ICD-10-CM

## 2021-09-29 DIAGNOSIS — E7849 Other hyperlipidemia: Secondary | ICD-10-CM | POA: Diagnosis present

## 2021-09-29 DIAGNOSIS — M310 Hypersensitivity angiitis: Secondary | ICD-10-CM | POA: Diagnosis present

## 2021-09-29 DIAGNOSIS — I4821 Permanent atrial fibrillation: Secondary | ICD-10-CM | POA: Diagnosis present

## 2021-09-29 DIAGNOSIS — I2510 Atherosclerotic heart disease of native coronary artery without angina pectoris: Secondary | ICD-10-CM

## 2021-09-29 DIAGNOSIS — Z8261 Family history of arthritis: Secondary | ICD-10-CM

## 2021-09-29 DIAGNOSIS — I350 Nonrheumatic aortic (valve) stenosis: Secondary | ICD-10-CM | POA: Diagnosis present

## 2021-09-29 DIAGNOSIS — Z825 Family history of asthma and other chronic lower respiratory diseases: Secondary | ICD-10-CM

## 2021-09-29 DIAGNOSIS — Z91048 Other nonmedicinal substance allergy status: Secondary | ICD-10-CM

## 2021-09-29 DIAGNOSIS — Z888 Allergy status to other drugs, medicaments and biological substances status: Secondary | ICD-10-CM

## 2021-09-29 DIAGNOSIS — J9610 Chronic respiratory failure, unspecified whether with hypoxia or hypercapnia: Secondary | ICD-10-CM | POA: Diagnosis not present

## 2021-09-29 DIAGNOSIS — Z823 Family history of stroke: Secondary | ICD-10-CM

## 2021-09-29 DIAGNOSIS — Z8616 Personal history of COVID-19: Secondary | ICD-10-CM

## 2021-09-29 DIAGNOSIS — Z7901 Long term (current) use of anticoagulants: Secondary | ICD-10-CM

## 2021-09-29 DIAGNOSIS — Z952 Presence of prosthetic heart valve: Secondary | ICD-10-CM | POA: Diagnosis not present

## 2021-09-29 DIAGNOSIS — D692 Other nonthrombocytopenic purpura: Secondary | ICD-10-CM | POA: Diagnosis present

## 2021-09-29 DIAGNOSIS — J9611 Chronic respiratory failure with hypoxia: Secondary | ICD-10-CM | POA: Diagnosis present

## 2021-09-29 DIAGNOSIS — Z7982 Long term (current) use of aspirin: Secondary | ICD-10-CM

## 2021-09-29 DIAGNOSIS — F419 Anxiety disorder, unspecified: Secondary | ICD-10-CM | POA: Diagnosis present

## 2021-09-29 DIAGNOSIS — K7460 Unspecified cirrhosis of liver: Secondary | ICD-10-CM | POA: Diagnosis present

## 2021-09-29 DIAGNOSIS — I110 Hypertensive heart disease with heart failure: Principal | ICD-10-CM | POA: Diagnosis present

## 2021-09-29 DIAGNOSIS — Z79899 Other long term (current) drug therapy: Secondary | ICD-10-CM

## 2021-09-29 HISTORY — PX: RIGHT HEART CATH: CATH118263

## 2021-09-29 LAB — POCT I-STAT EG7
Acid-Base Excess: 4 mmol/L — ABNORMAL HIGH (ref 0.0–2.0)
Acid-Base Excess: 4 mmol/L — ABNORMAL HIGH (ref 0.0–2.0)
Acid-Base Excess: 3 mmol/L — ABNORMAL HIGH (ref 0.0–2.0)
Acid-Base Excess: 4 mmol/L — ABNORMAL HIGH (ref 0.0–2.0)
Bicarbonate: 26.9 mmol/L (ref 20.0–28.0)
Bicarbonate: 27 mmol/L (ref 20.0–28.0)
Bicarbonate: 26.9 mmol/L (ref 20.0–28.0)
Bicarbonate: 27 mmol/L (ref 20.0–28.0)
Calcium, Ion: 1.1 mmol/L — ABNORMAL LOW (ref 1.15–1.40)
Calcium, Ion: 1.16 mmol/L (ref 1.15–1.40)
Calcium, Ion: 1.02 mmol/L — ABNORMAL LOW (ref 1.15–1.40)
Calcium, Ion: 1.16 mmol/L (ref 1.15–1.40)
HCT: 37 % — ABNORMAL LOW (ref 39.0–52.0)
HCT: 38 % — ABNORMAL LOW (ref 39.0–52.0)
HCT: 36 % — ABNORMAL LOW (ref 39.0–52.0)
HCT: 38 % — ABNORMAL LOW (ref 39.0–52.0)
Hemoglobin: 12.6 g/dL — ABNORMAL LOW (ref 13.0–17.0)
Hemoglobin: 12.9 g/dL — ABNORMAL LOW (ref 13.0–17.0)
Hemoglobin: 12.2 g/dL — ABNORMAL LOW (ref 13.0–17.0)
Hemoglobin: 12.9 g/dL — ABNORMAL LOW (ref 13.0–17.0)
O2 Saturation: 73 %
O2 Saturation: 84 %
O2 Saturation: 70 %
O2 Saturation: 94 %
Potassium: 2.1 mmol/L — CL (ref 3.5–5.1)
Potassium: 2.2 mmol/L — CL (ref 3.5–5.1)
Potassium: 2.2 mmol/L — CL (ref 3.5–5.1)
Potassium: <2 mmol/L — CL (ref 3.5–5.1)
Sodium: 132 mmol/L — ABNORMAL LOW (ref 135–145)
Sodium: 133 mmol/L — ABNORMAL LOW (ref 135–145)
Sodium: 131 mmol/L — ABNORMAL LOW (ref 135–145)
Sodium: 135 mmol/L (ref 135–145)
TCO2: 28 mmol/L (ref 22–32)
TCO2: 28 mmol/L (ref 22–32)
TCO2: 28 mmol/L (ref 22–32)
TCO2: 28 mmol/L (ref 22–32)
pCO2, Ven: 35.3 mmHg — ABNORMAL LOW (ref 44.0–60.0)
pCO2, Ven: 35.5 mmHg — ABNORMAL LOW (ref 44.0–60.0)
pCO2, Ven: 34.4 mmHg — ABNORMAL LOW (ref 44.0–60.0)
pCO2, Ven: 36.2 mmHg — ABNORMAL LOW (ref 44.0–60.0)
pH, Ven: 7.487 — ABNORMAL HIGH (ref 7.250–7.430)
pH, Ven: 7.493 — ABNORMAL HIGH (ref 7.250–7.430)
pH, Ven: 7.479 — ABNORMAL HIGH (ref 7.250–7.430)
pH, Ven: 7.503 — ABNORMAL HIGH (ref 7.250–7.430)
pO2, Ven: 35 mmHg (ref 32.0–45.0)
pO2, Ven: 45 mmHg (ref 32.0–45.0)
pO2, Ven: 34 mmHg (ref 32.0–45.0)
pO2, Ven: 65 mmHg — ABNORMAL HIGH (ref 32.0–45.0)

## 2021-09-29 LAB — PROTIME-INR
INR: 1.7 — ABNORMAL HIGH (ref 0.8–1.2)
Prothrombin Time: 19.8 seconds — ABNORMAL HIGH (ref 11.4–15.2)

## 2021-09-29 LAB — CBC
HCT: 36.3 % — ABNORMAL LOW (ref 39.0–52.0)
Hemoglobin: 11.6 g/dL — ABNORMAL LOW (ref 13.0–17.0)
MCH: 25.2 pg — ABNORMAL LOW (ref 26.0–34.0)
MCHC: 32 g/dL (ref 30.0–36.0)
MCV: 78.7 fL — ABNORMAL LOW (ref 80.0–100.0)
Platelets: 222 10*3/uL (ref 150–400)
RBC: 4.61 MIL/uL (ref 4.22–5.81)
RDW: 22.9 % — ABNORMAL HIGH (ref 11.5–15.5)
WBC: 5 10*3/uL (ref 4.0–10.5)
nRBC: 0 % (ref 0.0–0.2)

## 2021-09-29 LAB — HEPATIC FUNCTION PANEL
ALT: 17 U/L (ref 0–44)
AST: 36 U/L (ref 15–41)
Albumin: 3.5 g/dL (ref 3.5–5.0)
Alkaline Phosphatase: 60 U/L (ref 38–126)
Bilirubin, Direct: 0.7 mg/dL — ABNORMAL HIGH (ref 0.0–0.2)
Indirect Bilirubin: 1.5 mg/dL — ABNORMAL HIGH (ref 0.3–0.9)
Total Bilirubin: 2.2 mg/dL — ABNORMAL HIGH (ref 0.3–1.2)
Total Protein: 7.6 g/dL (ref 6.5–8.1)

## 2021-09-29 LAB — SARS CORONAVIRUS 2 BY RT PCR (HOSPITAL ORDER, PERFORMED IN ~~LOC~~ HOSPITAL LAB): SARS Coronavirus 2: NEGATIVE

## 2021-09-29 SURGERY — RIGHT HEART CATH
Anesthesia: LOCAL

## 2021-09-29 MED ORDER — ACETAMINOPHEN 325 MG PO TABS
650.0000 mg | ORAL_TABLET | ORAL | Status: DC | PRN
  Administered 2021-09-29 – 2021-10-04 (×9): 650 mg via ORAL
  Filled 2021-09-29 (×8): qty 2

## 2021-09-29 MED ORDER — LIDOCAINE HCL (PF) 1 % IJ SOLN
INTRAMUSCULAR | Status: DC | PRN
  Administered 2021-09-29: 2 mL via SUBCUTANEOUS

## 2021-09-29 MED ORDER — SODIUM CHLORIDE 0.9 % IV SOLN: 250.0000 mL | INTRAVENOUS | Status: DC | PRN

## 2021-09-29 MED ORDER — MIDODRINE HCL 5 MG PO TABS
10.0000 mg | ORAL_TABLET | Freq: Three times a day (TID) | ORAL | Status: DC
  Administered 2021-09-29 – 2021-10-01 (×7): 10 mg via ORAL
  Filled 2021-09-29 (×7): qty 2

## 2021-09-29 MED ORDER — WARFARIN SODIUM 5 MG PO TABS
5.0000 mg | ORAL_TABLET | Freq: Once | ORAL | Status: AC
  Administered 2021-09-29: 5 mg via ORAL
  Filled 2021-09-29: qty 1

## 2021-09-29 MED ORDER — POTASSIUM CHLORIDE CRYS ER 20 MEQ PO TBCR
EXTENDED_RELEASE_TABLET | ORAL | Status: AC
  Filled 2021-09-29: qty 2

## 2021-09-29 MED ORDER — POTASSIUM CHLORIDE CRYS ER 20 MEQ PO TBCR: 40.0000 meq | EXTENDED_RELEASE_TABLET | Freq: Once | ORAL | Status: DC

## 2021-09-29 MED ORDER — FAMOTIDINE 20 MG PO TABS
20.0000 mg | ORAL_TABLET | Freq: Every day | ORAL | Status: DC
  Administered 2021-09-29 – 2021-10-01 (×3): 20 mg via ORAL
  Filled 2021-09-29 (×3): qty 1

## 2021-09-29 MED ORDER — ROSUVASTATIN CALCIUM 5 MG PO TABS
5.0000 mg | ORAL_TABLET | Freq: Every evening | ORAL | Status: DC
  Administered 2021-09-29 – 2021-10-03 (×5): 5 mg via ORAL
  Filled 2021-09-29 (×5): qty 1

## 2021-09-29 MED ORDER — TADALAFIL 20 MG PO TABS
40.0000 mg | ORAL_TABLET | Freq: Every morning | ORAL | Status: DC
  Administered 2021-09-29 – 2021-10-04 (×6): 40 mg via ORAL
  Filled 2021-09-29 (×6): qty 2

## 2021-09-29 MED ORDER — SPIRONOLACTONE 25 MG PO TABS
25.0000 mg | ORAL_TABLET | Freq: Every day | ORAL | Status: DC
  Administered 2021-09-29 – 2021-10-02 (×4): 25 mg via ORAL
  Filled 2021-09-29 (×4): qty 1

## 2021-09-29 MED ORDER — SODIUM CHLORIDE 0.9% FLUSH
3.0000 mL | INTRAVENOUS | Status: DC | PRN
  Administered 2021-10-03: 3 mL via INTRAVENOUS

## 2021-09-29 MED ORDER — MACITENTAN 10 MG PO TABS
10.0000 mg | ORAL_TABLET | Freq: Every day | ORAL | Status: DC
  Administered 2021-09-29 – 2021-10-04 (×6): 10 mg via ORAL
  Filled 2021-09-29 (×6): qty 1

## 2021-09-29 MED ORDER — FENTANYL CITRATE (PF) 100 MCG/2ML IJ SOLN
INTRAMUSCULAR | Status: AC
  Filled 2021-09-29: qty 2

## 2021-09-29 MED ORDER — SODIUM CHLORIDE 0.9 % IV SOLN: INTRAVENOUS | Status: DC

## 2021-09-29 MED ORDER — HEPARIN (PORCINE) IN NACL 1000-0.9 UT/500ML-% IV SOLN
INTRAVENOUS | Status: AC
  Filled 2021-09-29: qty 500

## 2021-09-29 MED ORDER — ACETAMINOPHEN 325 MG PO TABS
ORAL_TABLET | ORAL | Status: AC
  Filled 2021-09-29: qty 2

## 2021-09-29 MED ORDER — LIDOCAINE HCL (PF) 1 % IJ SOLN
INTRAMUSCULAR | Status: AC
  Filled 2021-09-29: qty 30

## 2021-09-29 MED ORDER — SENNOSIDES-DOCUSATE SODIUM 8.6-50 MG PO TABS
1.0000 | ORAL_TABLET | Freq: Two times a day (BID) | ORAL | Status: DC | PRN
  Administered 2021-09-29 – 2021-10-04 (×9): 1 via ORAL
  Filled 2021-09-29 (×9): qty 1

## 2021-09-29 MED ORDER — SENNA 8.7 MG PO CHEW: CHEWABLE_TABLET | Freq: Every day | ORAL | Status: DC

## 2021-09-29 MED ORDER — MIDAZOLAM HCL 2 MG/2ML IJ SOLN
INTRAMUSCULAR | Status: AC
  Filled 2021-09-29: qty 2

## 2021-09-29 MED ORDER — SELEXIPAG 400 MCG PO TABS
400.0000 ug | ORAL_TABLET | Freq: Every morning | ORAL | Status: DC
  Administered 2021-09-30 – 2021-10-04 (×5): 400 ug via ORAL
  Filled 2021-09-29 (×7): qty 1

## 2021-09-29 MED ORDER — FUROSEMIDE 10 MG/ML IJ SOLN
80.0000 mg | Freq: Two times a day (BID) | INTRAMUSCULAR | Status: DC
  Administered 2021-09-29 – 2021-10-01 (×4): 80 mg via INTRAVENOUS
  Filled 2021-09-29 (×4): qty 8

## 2021-09-29 MED ORDER — POTASSIUM CHLORIDE CRYS ER 10 MEQ PO TBCR
40.0000 meq | EXTENDED_RELEASE_TABLET | Freq: Once | ORAL | Status: AC
  Administered 2021-09-29: 40 meq via ORAL
  Filled 2021-09-29: qty 4

## 2021-09-29 MED ORDER — MACITENTAN 10 MG PO TABS: 10.0000 mg | ORAL_TABLET | Freq: Every day | ORAL | Status: DC

## 2021-09-29 MED ORDER — SODIUM CHLORIDE 0.9% FLUSH
3.0000 mL | Freq: Two times a day (BID) | INTRAVENOUS | Status: DC
  Administered 2021-09-29 – 2021-10-04 (×6): 3 mL via INTRAVENOUS

## 2021-09-29 MED ORDER — SODIUM CHLORIDE 0.9% FLUSH: 3.0000 mL | Freq: Two times a day (BID) | INTRAVENOUS | Status: DC

## 2021-09-29 MED ORDER — SELEXIPAG 200 MCG PO TABS
200.0000 ug | ORAL_TABLET | Freq: Every evening | ORAL | Status: DC
  Administered 2021-09-29 – 2021-10-03 (×5): 200 ug via ORAL
  Filled 2021-09-29 (×7): qty 1

## 2021-09-29 MED ORDER — ONDANSETRON HCL 4 MG/2ML IJ SOLN: 4.0000 mg | Freq: Four times a day (QID) | INTRAMUSCULAR | Status: DC | PRN

## 2021-09-29 MED ORDER — ASPIRIN EC 81 MG PO TBEC
81.0000 mg | DELAYED_RELEASE_TABLET | Freq: Every morning | ORAL | Status: DC
  Administered 2021-09-29 – 2021-10-04 (×6): 81 mg via ORAL
  Filled 2021-09-29 (×6): qty 1

## 2021-09-29 MED ORDER — SODIUM CHLORIDE 0.9% FLUSH: 3.0000 mL | INTRAVENOUS | Status: DC | PRN

## 2021-09-29 MED ORDER — WARFARIN - PHARMACIST DOSING INPATIENT: Freq: Every day | Status: DC

## 2021-09-29 MED ORDER — ALBUTEROL SULFATE (2.5 MG/3ML) 0.083% IN NEBU
3.0000 mL | INHALATION_SOLUTION | Freq: Four times a day (QID) | RESPIRATORY_TRACT | Status: DC | PRN
  Filled 2021-09-29: qty 3

## 2021-09-29 SURGICAL SUPPLY — 7 items
CATH BALLN WEDGE 5F 110CM (CATHETERS) ×2 IMPLANT
GUIDEWIRE .025 260CM (WIRE) ×2 IMPLANT
PACK CARDIAC CATHETERIZATION (CUSTOM PROCEDURE TRAY) ×2 IMPLANT
PROTECTION STATION PRESSURIZED (MISCELLANEOUS) ×2
SHEATH GLIDE SLENDER 4/5FR (SHEATH) ×2 IMPLANT
STATION PROTECTION PRESSURIZED (MISCELLANEOUS) ×1 IMPLANT
TRANSDUCER W/STOPCOCK (MISCELLANEOUS) ×2 IMPLANT

## 2021-09-29 NOTE — Progress Notes (Signed)
Pt received from cath lab. VSS. R brachial level 0. Pt oriented to room and unit. Call light in reach.  Clyde Canterbury, RN

## 2021-09-29 NOTE — Interval H&P Note (Signed)
History and Physical Interval Note:  09/29/2021 10:02 AM  Lance Hicks  has presented today for surgery, with the diagnosis of CHF.  The various methods of treatment have been discussed with the patient and family. After consideration of risks, benefits and other options for treatment, the patient has consented to  Procedure(s): RIGHT HEART CATH (N/A) as a surgical intervention.  The patient's history has been reviewed, patient examined, no change in status, stable for surgery.  I have reviewed the patient's chart and labs.  Questions were answered to the patient's satisfaction.     [name]

## 2021-09-29 NOTE — Progress Notes (Signed)
ANTICOAGULATION CONSULT NOTE - Initial Consult  Pharmacy Consult for warfarin Indication: atrial fibrillation and aortic valve  Allergies  Allergen Reactions   Pantoprazole Sodium Nausea Only    Gets gassy and starting itching like crazy Gets gassy and starting itching like crazy   Valium [Diazepam] Other (See Comments)    HA and Abd pain.   Tape Rash and Other (See Comments)    Surgical tape   Wound Dressing Adhesive Other (See Comments) and Rash    Surgical tape Surgical tape Surgical tape    Patient Measurements: Height: _0  (175.3 cm) Weight: 60.3 kg (133 lb) IBW/kg (Calculated) : 70.7  Vital Signs: Temp: 97.9 F (36.6 C) (10/06 0801) Temp Source: Oral (10/06 0801) BP: 104/55 (10/06 1255) Pulse Rate: 51 (10/06 1255)  Labs: Recent Labs    09/27/21 1546 09/29/21 0853  HGB  --  11.6*  HCT  --  36.3*  PLT  --  222  LABPROT 24.8* 19.8*  INR 2.3* 1.7*  CREATININE 1.72*  --     Estimated Creatinine Clearance: 32.1 mL/min (A) (by C-G formula based on SCr of 1.72 mg/dL (H)).   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 In light of concurrent left nephrolithiasis, will check CT renal colic next year instead of renal US.    Anticoagulated on Coumadin 01/04/2018   Anxiety 05/10/2016   Last Assessment & Plan:  Doing well off of zoloft.   Asthma    Asymptomatic microscopic hematuria 06/20/2017   Last Assessment & Plan:  Had hematuria workup in [facility] in 2016 which negative CT and cystoscopy. UA with 2+ blood last visit - we discussed recommendation for repeat workup at 5 years or if degree of hematuria progresses.    Atrial fibrillation ([place]) 03/29/2017   Atrial flutter ([place]) 03/29/2017   Backache 12/14/2015   Last Assessment & Plan:  Pain management referral for  further evaluation.   Bilateral pleural effusion 08/09/2017   CHF (congestive heart failure) (HCC)    Chronic allergic rhinitis 04/28/2016   Last Assessment & Plan:  Continue astelin   Chronic anticoagulation 03/29/2017   Chronic atrial fibrillation ([place]) 07/23/2015   Last Assessment & Plan:  Coumadin and metoprolol, cardiology referral to establish care.   Chronic midline back pain 12/14/2015   Last Assessment & Plan:  Pain management referral for further evaluation.   Chronic obstructive lung disease ([place]) 06/12/2016   With hypoxia   Chronic prostatitis 07/23/2015   Last Assessment & Plan:  Has largely resolved since stopping bike riding. Recommend annual DRE AND PSA - will see back 12/2015 for annual screening, given 1st degree fhx. To call office for recurrent prostatitis symptoms.    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 If symptoms worsen or fail to improve by 7-10d, delayed ATBs, fluids, rest, NSAIDs/APAP prn. Seek care if not improving. Needs earlier INR check due to ATBs.   Dyspnea  02/01/2016   Last Assessment & Plan:  Overall improving, eval by pulm, plan for CT, neg stress test with cardiology. Recent switch to carvedilol due to side effects.   Encounter for therapeutic drug monitoring 01/06/2019   Enterococcal bacteremia    Epidermoid cyst of skin 08/24/2017   Essential hypertension 12/14/2015   Last Assessment & Plan:  Hypertension control: controlled  Medications: compliant Medication Management: as noted in orders Home blood pressure monitoring recommended additionally as needed for symptoms  The  patient's care plan was reviewed and updated. Instructions and counseling were provided regarding patient goals and barriers. He was counseled to adopt a healthy lifestyle. Educational resources and self-management tools have been provided as charted in [facility] list.    H/O maze procedure 03/29/2017   [name] 03/29/2017   Overview:  2011   History of coronary artery bypass graft 03/29/2017   Hx of CABG 03/29/2017   Hyperlipidemia 03/29/2017   Hypersensitivity angiitis ([place]) 10/01/2017   Hypertensive heart disease 03/29/2017   Hypertensive heart disease with heart failure ([place]) 03/29/2017   Hypertensive heart failure ([place]) 03/29/2017   Hypokalemia due to excessive renal loss of potassium 02/18/2018   Hypotension, chronic 06/06/2018   International normalized ratio (INR) raised 07/26/2017   Kidney stone 07/23/2015   Kidney stones 07/23/2015   Overview:  x 3  Last Assessment & Plan:  By [place] has left nephrolithiasis, but not visible by KUB. Will check CT renal colic next year to assess both stone burden as well as to surveil AML.    Left ureteral stone 01/23/2018   Leukocytoclastic vasculitis ([place]) 10/01/2017   Localized edema 01/04/2018   Long term (current) use of anticoagulants 03/29/2017   Lumbar radicular pain 01/19/2016   Lumbar radiculopathy 01/19/2016   Maculopapular rash 09/03/2017   Microscopic hematuria 06/20/2017   Last Assessment & Plan:  Had hematuria workup in [facility] in 2016 which negative CT and cystoscopy. UA with 2+ blood last visit - we discussed recommendation for repeat workup at 5 years or if degree of hematuria progresses.    Multiple nodules of lung 06/12/2016   Nasal discharge 02/25/2016   Last Assessment & Plan:  Trial zyrtec and flonase   Nephrolithiasis 07/23/2015   Overview:  x 3  Last Assessment & Plan:  By [place] has left nephrolithiasis, but not visible by KUB. Will check CT renal colic next year to assess both stone burden as well as to surveil AML.   Overview:  x 3  Last  Assessment & Plan:  Has 62m nonobstructing LUP stone - not visible by KUB.  Will check renal [place] - he will contact office sooner if symptomatic.    Non-sustained ventricular tachycardia 03/29/2017   Nonsustained ventricular tachycardia 03/29/2017   Other hyperlipidemia 03/29/2017   Palpitations 10/01/2017   Pleural effusion, bilateral 08/09/2017   Pneumonia due to COVID-19 virus 02/07/2021   Post-nasal drainage 02/25/2016   Last Assessment & Plan:  Trial zyrtec and flonase   Prostate cancer screening 06/20/2017   Last Assessment & Plan:  Recommend continued annual CaP screening until within 10 years of life expectancy. Given good health and fhx of longevity, would anticipate CaP screening to continue until age 75  PSA today and again in one year on day of visit.   Pulmonary arterial hypertension [place] 02/20/2018   Pulmonary edema    Pulmonary hypertension [place] 08/09/2017   Pulmonary nodules 06/12/2016   S/P AVR 03/20/2016   S/P AVR (aortic valve replacement)  03/20/2016   Strain of deltoid muscle, initial encounter    Supratherapeutic INR 07/26/2017   Syncope and collapse 02/01/2016   Typical atrial flutter ([place]) 02/01/2016   Assessment:  75 year old male with history of afib and Mechanical aortic valve replacement on chronic coumadin. Patient admitted post [place] this morning. INR low at 1.7, will resume warfarin tonight.   Warfarin 2.70m daily except 53mon MWF  Goal of Therapy:  INR goal 2.5-3 Monitor platelets by anticoagulation protocol: Yes   Plan:  Warfarin 69m58monight Daily INR for now Will follow up need for bridge therapy  FraErin HearingarmD., BCPS Clinical Pharmacist 09/29/2021 4:17 PM

## 2021-09-29 NOTE — Progress Notes (Signed)
Received a call from central tele pt.HR went down to 36 -37;with a pause 2.19 secs.B/P =94/64,Pt is asleep  & asymptomatic.Dr.Bensimhon was called & made aware.Will continue to monitor pt.

## 2021-09-29 NOTE — H&P (Signed)
Advanced Heart Failure  H&P  Note   Date:  09/29/2021   PCP:  [name], PA-C  Pulmonology: Dr. Lamonte Sakai Cardiologist:  Dr. Bettina Gavia  HF Cardiologist: Dr. 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 Also has COPD (quit smoking in 2002 - 1 ppd x 25 years), HL, and HTN.  Seen at [place] in 2018 had hi-res CT, PFTs and VQ (low prob). Felt to have Golds I COPD with asthma overlap and early pulmonary fibrosis with PAH. Echo 8/18  normal LV function with septal flattening and RVSP 65-53mHG. 14-day event monitor showed 10-beat run NSVT. Felt to have chronic AF and PAH.   Underwent RHC on 01/18/18 with severe PAH and cor pulmonale. Macitentan and Adcirca. Subsequently started on selexipag but dose limited by cramping now on 400/200  Admitted 5/19 with symptomatic hypotension and weight gain. He had a repeat RHC and echo with bubble study (results below). He was started on midodrine for BP support. Diuresed 25 lbs with IV lasix, then transitioned to torsemide 60 mg daily. Abdominal UKore[place]owed cirrhosis with no evidence of portal HTN. Pulm consulted for possible ILD on High-res chest CT.  Acute visit 08/02/21 feeling poorly, NYHA III symptoms, volume ok. Suspected worsening anxiety driving symptoms.    Seen 09/12/21 with NYHA IIIb-IV symptoms. ReDs 38%. Given 80 m g IV lasix + metolazone 2.5 in clinic. Then arranged for 80 mg IV lasix x 2 days with [facility]. I saw him for his lab appt and he was some better but still had LE edema so we did one more dose of 80 mg IV lasix with [facility], last dose given 09/21/21.  Seen in HF Clinic yesterday with c/o worsening functional capacity and volume overload not responding to IV lasix. Thinks it is related to tick byte he had months ago.   RHC today with moderate PAH  and mildly elevated filling pressures. Admitted for diuresis.    RA = 12 RV = 71/9 PA = 79/20 (38) PCW = 21 (V = 30) Fick cardiac output/index = 7.0/3.14 PVR = 3.14 FA sat = 99% PA sat = 70% 74% PaPI = 4.9   Studies: - Echo (4/22): EF 50-55%, RV ok, severe RVSP 61.6 mmHg, severe biatrial enlargement, moderate TR, stable AVR, AV mean gradient 11 mmHg  - Echo (3/21): EF 45-50% AVR ok. Septal flattening RV dilated mild HK. Moderate TR RVSP 80 severe bilateral enlargement.   - RHC 05/14/18 showed Mild/Moderate PAH in setting of high output with no evidence of intracardiac shunting. Hemodynamics as below.    - High-Res Chest CT 05/16/18 1. Slightly irregular 0.9 cm peripheral right middle lobe solid pulmonary nodule. 3 month follow up recommended.  2. Dependent basilar predominant patchy subpleural reticulation and ground-glass attenuation with the suggestion of minimal associated traction bronchiolectasis. No frank honeycombing. Findings may indicate an interstitial lung disease such as early usual interstitial pneumonia (UIP) or fibrotic phase nonspecific interstitial pneumonia (NSIP). Suggest a follow-up high-resolution chest CT study in 6-12 months to assess temporal pattern stability, as clinically warranted. 3. Mild cardiomegaly. Minimal interlobular septal thickening and trace dependent bilateral pleural effusions.  4. Small to moderate volume perihepatic ascites. 5. Ectatic 4.3 cm ascending thoracic aorta. Recommend annual imaging followup by CTA or MRA.  6. Left main and  3 vessel coronary atherosclerosis.   - US Abdomen RUQ 05/16/18 - s/p cholecystectomy. + hepatic cirrhosis. Mild ascites.    - Liver Doppler 05/16/18 - No hepatic, splenic, or portal venous thrombosis or occlusion. Mild ascites.   - Echo with bubble study 05/16/18 LVEF 55-60% with "late bubbles" , Mechanical AVR stable with trivial perivalvular regurg, Mild MR, Severe LAE, Severe RV dilation and reduced  function. No PFO. Mod TR, PA peak pressure 68 mm Hg   - RHC 05/14/18 RA = 18 RV = 71/17 PA = 69/24 (39) PCW = 20 Fick cardiac output/index = 7.0/3.7 Thermo CO/CI = 7.2/3.8 PVR = 2.6 WU Ao sat = 97% PA sat =  73%, 73% SVC sat = 73%  RA sat = 72%  - RHC 01/18/18 RA = 23 RV = 84/21 PA = 81/38 (53) PCW = 22 Fick cardiac output/index = 3.0/1.6 PVR = 10.4 WU FA sat = 98% on 2L  (checked on RA = 93%) PA sat = 60%, 61% SVC sat = 62%  -14 Day Patch 10/01/2017-10/15/2017: -1 run of Ventricular Tachycardia occurred lasting 10 beats with a max rate of 240 bpm (avg 161 bpm). -Atrial Flutter occurred continuously (100% burden), ranging from 55-132 bpm (avg of 87 bpm).   - NM Ventilation and Perfusion Lung Scan 10/31/2017:  Low probability of pulmonary embolism, using the PIOPED criteria.  - PFTs 07/02/2017:       FVC     86% FEV1   85% FEV1/FVC  73% DLCO  8.2, 32%   - 6MW -  1400 feet; 97% to 85% (recovered with resting)                                    CT Chest 07/02/2017: 1. Stable right middle lobe pulmonary nodule measuring up to 1 cm. 2. Minimal subpleural reticulation without evidence of honeycombing to  suggest pulmonary fibrosis. Stable centrilobular emphysema. 3. Mediastinal lymphadenopathy, increased from prior. 4. Calcified right hilar lymph nodes and multiple calcified granulomas  throughout the right lung consistent with prior granulomatous disease. 5. Ascending thoracic aortic aneurysm measuring up to 4.5 cm, unchanged. 6. Dilatation of the main pulmonary artery suggestive of pulmonary  hypertension. 7. Cardiomegaly and dense atherosclerotic coronary artery calcifications.   Review of systems complete and found to be negative unless listed in HPI.   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 In light of concurrent left nephrolithiasis, will check CT renal colic next year instead of renal US.    Anticoagulated on Coumadin 01/04/2018   Anxiety 05/10/2016   Last Assessment & Plan:  Doing well off of zoloft.   Asthma    Asymptomatic microscopic hematuria 06/20/2017   Last Assessment & Plan:  Had hematuria workup in [facility] in [facility] which negative CT and cystoscopy. UA with 2+ blood last visit - we discussed recommendation for repeat workup at 5 years or if degree of hematuria progresses.    Atrial fibrillation ([place]) 03/29/2017   Atrial flutter ([place]) 03/29/2017   Backache 12/14/2015   Last Assessment & Plan:  Pain management referral for further evaluation.   Bilateral pleural effusion 08/09/2017   CHF (congestive heart failure) (HCC)    Chronic allergic  rhinitis 04/28/2016   Last Assessment & Plan:  Continue astelin   Chronic anticoagulation 03/29/2017   Chronic atrial fibrillation ([place]) 07/23/2015   Last Assessment & Plan:  Coumadin and metoprolol, cardiology referral to establish care.   Chronic midline back pain 12/14/2015   Last Assessment & Plan:  Pain management referral for further evaluation.   Chronic obstructive lung disease ([place]) 06/12/2016   With hypoxia   Chronic prostatitis 07/23/2015   Last Assessment & Plan:  Has largely resolved since stopping bike riding. Recommend annual DRE AND PSA - will see back 12/2015 for annual screening, given 1st degree fhx. To call office for recurrent prostatitis symptoms.    Chronic respiratory failure with hypoxia ([place]) 07/29/2019   Cirrhosis of liver not due to alcohol ([place]) 07/01/2019   COPD (chronic obstructive pulmonary disease) ([place]) 06/12/2016   Coronary arteriosclerosis in native artery 03/29/2017   Coronary artery disease involving native coronary artery of native heart with angina pectoris ([place]) 03/29/2017   Cough 10/17/2016   Last Assessment & Plan:  Discussed typical course for acute viral illness. If symptoms worsen or fail  to improve by 7-10d, delayed ATBs, fluids, rest, NSAIDs/APAP prn. Seek care if not improving. Needs earlier INR check due to ATBs.   Dyspnea 02/01/2016   Last Assessment & Plan:  Overall improving, eval by pulm, plan for CT, neg stress test with cardiology. Recent switch to carvedilol due to side effects.   Encounter for therapeutic drug monitoring 01/06/2019   Enterococcal bacteremia    Epidermoid cyst of skin 08/24/2017   Essential hypertension 12/14/2015   Last Assessment & Plan:  Hypertension control: controlled  Medications: compliant Medication Management: as noted in orders Home blood pressure monitoring recommended additionally as needed for symptoms  The patient's care plan was reviewed and updated. Instructions and counseling were provided regarding patient goals and barriers. He was counseled to adopt a healthy lifestyle. Educational resources and self-management tools have been provided as charted in [facility] list.    H/O maze procedure 03/29/2017   [name] 03/29/2017   Overview:  2011   History of coronary artery bypass graft 03/29/2017   Hx of CABG 03/29/2017   Hyperlipidemia 03/29/2017   Hypersensitivity angiitis ([place]) 10/01/2017   Hypertensive heart disease 03/29/2017   Hypertensive heart disease with heart failure ([place]) 03/29/2017   Hypertensive heart failure ([place]) 03/29/2017   Hypokalemia due to excessive renal loss of potassium 02/18/2018   Hypotension, chronic 06/06/2018   International normalized ratio (INR) raised 07/26/2017   Kidney stone 07/23/2015   Kidney stones 07/23/2015   Overview:  x 3  Last Assessment & Plan:  By [place] has left nephrolithiasis, but not visible by KUB. Will check CT renal colic next year to assess both stone burden as well as to surveil AML.    Left ureteral stone 01/23/2018   Leukocytoclastic vasculitis ([place]) 10/01/2017   Localized edema 01/04/2018   Long term (current) use of anticoagulants 03/29/2017   Lumbar radicular pain 01/19/2016   Lumbar  radiculopathy 01/19/2016   Maculopapular rash 09/03/2017   Microscopic hematuria 06/20/2017   Last Assessment & Plan:  Had hematuria workup in [facility] in 2016 which negative CT and cystoscopy. UA with 2+ blood last visit - we discussed recommendation for repeat workup at 5 years or if degree of hematuria progresses.    Multiple nodules of lung 06/12/2016   Nasal discharge 02/25/2016   Last Assessment & Plan:  Trial zyrtec and flonase   Nephrolithiasis 07/23/2015  Overview:  x 3  Last Assessment & Plan:  By [place] has left nephrolithiasis, but not visible by KUB. Will check CT renal colic next year to assess both stone burden as well as to surveil AML.   Overview:  x 3  Last Assessment & Plan:  Has 72m nonobstructing LUP stone - not visible by KUB.  Will check renal [place]2019 - he will contact office sooner if symptomatic.    Non-sustained ventricular tachycardia 03/29/2017   Nonsustained ventricular tachycardia 03/29/2017   Other hyperlipidemia 03/29/2017   Palpitations 10/01/2017   Pleural effusion, bilateral 08/09/2017   Pneumonia due to COVID-19 virus 02/07/2021   Post-nasal drainage 02/25/2016   Last Assessment & Plan:  Trial zyrtec and flonase   Prostate cancer screening 06/20/2017   Last Assessment & Plan:  Recommend continued annual CaP screening until within 10 years of life expectancy. Given good health and fhx of longevity, would anticipate CaP screening to continue until age 75  PSA today and again in one year on day of visit.   Pulmonary arterial hypertension ([place] 02/20/2018   Pulmonary edema    [place]ulmonary hypertension ([place] 08/09/2017   Pulmonary nodules 06/12/2016   S/P AVR 03/20/2016   S/P AVR (aortic valve replacement) 03/20/2016   Strain of deltoid muscle, initial encounter    Supratherapeutic INR 07/26/2017   Syncope and collapse 02/01/2016   Typical atrial flutter ([place] 02/01/2016   Past Surgical History:  Procedure Laterality Date   CHOLECYSTECTOMY     CORONARY ARTERY BYPASS GRAFT     EXPLORATORY  LAPAROTOMY  07/30/2017   FOOT SURGERY     FRACTURE SURGERY Right    wrist and forearm   HERNIA REPAIR     MECHANICAL AORTIC VALVE REPLACEMENT     NASAL SINUS SURGERY     RIGHT HEART CATH N/A 01/18/2018   Procedure: RIGHT HEART CATH;  Surgeon: BJolaine Artist MD;  [facility];  Service: Cardiovascular;  Laterality: N/A;   RIGHT HEART CATH N/A 05/14/2018   Procedure: RIGHT HEART CATH;  Surgeon: BJolaine Artist MD;  [facility];  Service: Cardiovascular;  Laterality: N/A;   TEE WITHOUT CARDIOVERSION N/A 05/21/2018   Procedure: TRANSESOPHAGEAL ECHOCARDIOGRAM (TEE);  Surgeon: BJolaine Artist MD;  [facility];  Service: Cardiovascular;  Laterality: N/A;   UPPER GASTROINTESTINAL ENDOSCOPY  07/12/2017   Patchy areas of mucosal inflammation noted in the antrum with edema,erthema and ulcerations. Bx. Chronicfocally active gastritis.   VASECTOMY     Current Medications: Current Meds  Medication Sig   acetaminophen (TYLENOL) 500 MG tablet Take 500-1,000 mg by mouth every 6 (six) hours as needed (pain.).   ammonium lactate (LAC-HYDRIN) 12 % lotion Apply 1 application topically as needed for dry skin.   Ascorbic Acid (VITAMIN C GUMMIE PO) Take 2 tablets by mouth daily at 12 noon.   aspirin EC 81 MG tablet Take 81 mg by mouth in the morning.   Cholecalciferol (VITAMIN D3) 50 MCG (2000 UT) TABS Take 2,000 Units by mouth daily at 12 noon.   Coenzyme Q10-Vitamin E (QUNOL ULTRA COQ10 PO) Take 1 capsule by mouth in the morning.   famotidine-calcium carbonate-magnesium hydroxide (PEPCID COMPLETE) 10-800-165 MG chewable tablet Chew 2 tablets by mouth daily as needed (indigestion/heartburn).   Ferrous Sulfate 50 MG TBCR Take 1 tablet by mouth every 3 (three) days. Nature's Way Ultimate Iron Maximum Absorption Synergistic Blend   HEMP OIL-VANILLYL BUTYL ETHER EX Apply 1 application topically as  needed (dry skin). Hemp Oil Lotion   Homeopathic Products  (STRESS/EXHAUSTION RELIEF SL) Take 2 tablets by mouth every other day. Nature Made Stress Relief Gummies Mixed Berry   KRILL OIL PO Take 800 mg by mouth every other day. In the morning   Magnesium 200 MG CHEW Chew 400 mg by mouth daily at 12 noon.   metolazone (ZAROXOLYN) 2.5 MG tablet TAKE 1 TABLET(2.5 MG) BY MOUTH DAILY AS NEEDED FOR FLUID RETENTION   midodrine (PROAMATINE) 10 MG tablet Take 1 tablet (10 mg total) by mouth 3 (three) times daily with meals.   OPSUMIT 10 MG tablet TAKE 1 TABLET DAILY   potassium chloride SA (KLOR-CON) 20 MEQ tablet Take 3 tablets (60 mEq total) by mouth in the morning AND 2 tablets (40 mEq total) every evening.   Probiotic Product (DIGESTIVE ADVANTAGE) CAPS Take 3 capsules by mouth daily in the afternoon.   rosuvastatin (CRESTOR) 5 MG tablet TAKE 1 TABLET(5 MG) BY MOUTH DAILY (Patient taking differently: Take 5 mg by mouth every evening.)   Selexipag (UPTRAVI) 200 MCG TABS Take 200 mcg by mouth every evening.   Selexipag (UPTRAVI) 400 MCG TABS Take 400 mcg by mouth in the morning.   Senna (SENOKOT LAXATIVE GUMMIES PO) Take 3 tablets by mouth daily in the afternoon.   spironolactone (ALDACTONE) 25 MG tablet TAKE 1/2 TABLET BY MOUTH EVERY DAY   tadalafil, PAH, (ADCIRCA) 20 MG tablet Take 40 mg by mouth in the morning.   TART CHERRY PO Take 2 tablets by mouth daily at 12 noon. HumanN Tart Cherry Gummy by [employer]   torsemide (DEMADEX) 20 MG tablet Take 3 tablets (60 mg total) by mouth daily. (Patient taking differently: Take 40-60 mg by mouth See admin instructions. Take 2 tablets (40 mg) by mouth in the morning every other days alternating with 3 tablets (60 mg) by mouth in the morning every other day.)   Turmeric 500 MG TABS Take 1 capsule by mouth daily in the afternoon. With Ginger 50 mg   vitamin B-12 (CYANOCOBALAMIN) 1000 MCG tablet Take 1,000 mcg by mouth daily at 12 noon.   warfarin (COUMADIN) 5 MG tablet TAKE 1/2 TO 1 TABLET BY MOUTH DAILY AS DIRECTED BY  COUMADIN CLINIC (Patient taking differently: Take 2.5-5 mg by mouth See admin instructions. TAKE 1/2 TO 1 TABLET BY MOUTH DAILY AS DIRECTED BY COUMADIN CLINIC  Take 0.5 tablet (2.5 mg) by mouth on Sundays, Mondays, Tuesdays, Thursdays, Fridays. Take 1 tablet (5 mg) by mouth on  Wednesdays & Saturdays.)   Zinc 50 MG TABS Take 50 mg by mouth every other day. In the afternoon    Allergies:   Pantoprazole sodium, Valium [diazepam], Tape, and Wound dressing adhesive   Social History   Socioeconomic History   Marital status: Single    Spouse name: Not on file   Number of children: Not on file   Years of education: Not on file   Highest education level: Not on file  Occupational History   Not on file  Tobacco Use   Smoking status: Former    Packs/day: 2.00    Years: 34.00    Pack years: 68.00    Types: Cigarettes    Quit date: 07/16/2000    Years since quitting: 21.2   Smokeless tobacco: Never  Vaping Use   Vaping Use: Never used  Substance and Sexual Activity   Alcohol use: Not Currently   Drug use: No   Sexual activity: Not on file  Other  Topics Concern   Not on file  Social History Narrative   Not on file   Social Determinants of Health   Financial Resource Strain: Not on file  Food Insecurity: Not on file  Transportation Needs: Not on file  Physical Activity: Not on file  Stress: Not on file  Social Connections: Not on file    Family History: The patient's family history includes Arthritis in his mother; Asthma in his mother; Heart attack in his father; Hypertension in his father; Stroke in his paternal grandmother. There is no history of Colon cancer.  Recent Labs: 08/16/2021: ALT 12; Pro B Natriuretic peptide (BNP) 601.0 08/17/2021: TSH 2.71 09/27/2021: B Natriuretic Peptide 953.2; BUN 28; Creatinine, Ser 1.72; Potassium 3.7; Sodium 127 09/29/2021: Hemoglobin 11.6; Platelets 222   Recent Lipid Panel    Component Value Date/Time   CHOL 133 10/12/2020 1104   TRIG  111 10/12/2020 1104   HDL 44 10/12/2020 1104   CHOLHDL 3.0 10/12/2020 1104   VLDL 11 10/21/2018 1028   LDLCALC 70 10/12/2020 1104   BP 120/67   Pulse 70   Temp 97.9 F (36.6 C) (Oral)   Resp 17   Ht _0  (1.753 m)   Wt 60.3 kg   SpO2 99%   BMI 19.64 kg/m     Wt Readings from Last 3 Encounters:  09/29/21 60.3 kg  09/27/21 66 kg  09/12/21 66 kg   Physical Exam:  General:  Fatigued. Anxious  HEENT: normal Neck: supple.JVP to jaw . Carotids 2+ bilat; no bruits. No lymphadenopathy or thryomegaly appreciated. Cor: PMI nondisplaced. Irregular rate & rhythm. Mechanical s/2 Lungs: clear Abdomen: soft, nontender, nondistended. No hepatosplenomegaly. No bruits or masses. Good bowel sounds. Extremities: no cyanosis, clubbing, rash, 2+ woody edema  Neuro: alert & orientedx3, cranial nerves grossly intact. moves all 4 extremities w/o difficulty. Affect pleasant    Assessment/Plan  1. Acute on chronic diastolic HF - volume status moderately elevated.  - not responding well to outpatient management.  - admit for IV diuresis - place UNNA boots. Consider SGLT2i  2. Pulmonary hypertension with cor pulmonale/RV failure - WHO Group I & II - Auto-immune serologies negative (checked twice). - ? Component of HHT/shunt/AVMs with late bubbles on bubble study.  - Echo with bubble study (05/16/18): EF 55-60% with "late bubbles" , Mechanical AVR stable with trivial perivalvular regurg, Mild MR, Severe LAE, Severe RV dilation and reduced function. No PFO. Mod TR, PA peak pressure 68 mm Hg - Echo (10/19): EF 55-60% mild LVH; s/p AVR with mean gradient 11 mmHg and trace AI; mild MR; severe biatrial enlargement; mild RVE; severe TR; severe pulmonary hypertension. RVSP 74mHG - Echo (4/22): EF 50-55%, RV ok, severe RVSP 61.6 mmHg, severe biatrial enlargement, moderate TR, stable AVR, AV mean gradient 11 mmHg - RHC data as above - Ab u/s with cirrhosis but no evidence of portal HTN. - On triple  therapy with Macitentan and Adcirca. Uptravi dose at 400/200 limited by cramping.  Numbers not c/w need for IV therapies - NYHA IIIb  - Diurese  3. Chronic respiratory failure - [place] CT 05/16/18 with "dependent basilar predominant patchy subpleural reticulation and ground-glass attenuation with the suggestion of minimal associated traction bronchiolectasis. No frank honeycombing. Findings may indicate an interstitial lung disease such as early usual interstitial pneumonia (UIP) or fibrotic phase nonspecific interstitial pneumonia (NSIP)." - Followed by Dr. BLamonte Sakai - No change.   4. Chronic AFL - Rate controlled.  Now off metoprolol.  -  Continue coumadin with mechanical AVR.  5. Frequent PVCs - this may be contributing to HF - repeat echo - quantify overnight. Start mexilitene 200 bid tomorrow   6 CAD - s/p CABG 07/2017 with Dr Roderic Palau in [place].  - No s/s ischemia - Continue Crestor 5 mg daily (unable to tolerate atorva due to myalgias).  - Lipids followed by Dr. Bettina Gavia.   7. S/p mechanical AVR - Stable on echo 4/22. - Continue coumadin/ASA 81.  - INR goal is 2.5-3 per Dr Bettina Gavia. - Aware of need for SBE prophylaxis.   8. Leukocytoclastic vasculitis - F/u with Rheumatology.  - No changes.   9. Cirrhosis - US Abdomen RUQ 05/16/18 - s/p cholecystectomy. + hepatic cirrhosis. Mild ascites.  - Liver Doppler 05/16/18 - No hepatic, splenic, or portal venous thrombosis or occlusion. Mild ascites. - Continue midodrine 15 mg bid (it was 10 tid but he says it works better this way)  Glori Bickers, MD  09/29/21

## 2021-09-29 NOTE — Progress Notes (Signed)
Pharmacy Consult for Pulmonary Hypertension Treatment   Indication - Continuation of prior to admission medication   Patient is 75 y.o.  with history of PAH on chronic Macitentan (Opsumit) PTA and will be continued while hospitalized.   Continuing this medication order as an inpatient requires that monitoring parameters per REMS requirements must be met.  Chronic therapy is under the supervision of Dr. Pierre Bali  who is enrolled in the REMS program and is being notified of continuation of therapy. A staff message in EPIC has been sent notifying the certified prescriber.  Per patient report has previously been educated on Hepatotoxicity . On admission pregnancy risk has been assessed and no monitoring required. Hepatic function has been evaluated. AST/ALT appropriate to continue medication at this time. Hepatic panel ordered for this admission and will be monitored.  Hepatic Function Latest Ref Rng & Units 08/16/2021 07/04/2021 06/17/2021  Total Protein 6.0 - 8.3 g/dL 8.2 8.6(H) 8.2(H)  Albumin 3.5 - 5.2 g/dL 4.3 4.3 -  AST 0 - 37 U/L 28 33 29  ALT 0 - 53 U/L _0 Alk Phosphatase 39 - 117 U/L 65 83 -  Total Bilirubin 0.2 - 1.2 mg/dL 2.0(H) 1.9(H) 2.2(H)  Bilirubin, Direct 0.1 - 0.5 mg/dL - - -    If any question arise or pregnancy is identified during hospitalization, contact for bosentan and macitentan: [phone]; ambrisentan: [phone].  Thank for you allowing [place] to participate in the care of this patient.

## 2021-09-29 NOTE — Progress Notes (Signed)
Nasal swab for COVID obtained and sent to lab

## 2021-09-30 ENCOUNTER — Inpatient Hospital Stay (HOSPITAL_COMMUNITY): Payer: Medicare Other

## 2021-09-30 DIAGNOSIS — I5033 Acute on chronic diastolic (congestive) heart failure: Secondary | ICD-10-CM

## 2021-09-30 LAB — CBC
HCT: 35.4 % — ABNORMAL LOW (ref 39.0–52.0)
HCT: 37 % — ABNORMAL LOW (ref 39.0–52.0)
Hemoglobin: 11.3 g/dL — ABNORMAL LOW (ref 13.0–17.0)
Hemoglobin: 11.9 g/dL — ABNORMAL LOW (ref 13.0–17.0)
MCH: 25.3 pg — ABNORMAL LOW (ref 26.0–34.0)
MCH: 25.3 pg — ABNORMAL LOW (ref 26.0–34.0)
MCHC: 31.9 g/dL (ref 30.0–36.0)
MCHC: 32.2 g/dL (ref 30.0–36.0)
MCV: 78.7 fL — ABNORMAL LOW (ref 80.0–100.0)
MCV: 79.4 fL — ABNORMAL LOW (ref 80.0–100.0)
Platelets: 192 10*3/uL (ref 150–400)
Platelets: 226 10*3/uL (ref 150–400)
RBC: 4.46 MIL/uL (ref 4.22–5.81)
RBC: 4.7 MIL/uL (ref 4.22–5.81)
RDW: 22.8 % — ABNORMAL HIGH (ref 11.5–15.5)
RDW: 22.9 % — ABNORMAL HIGH (ref 11.5–15.5)
WBC: 4.5 10*3/uL (ref 4.0–10.5)
WBC: 5.2 10*3/uL (ref 4.0–10.5)
nRBC: 0 % (ref 0.0–0.2)
nRBC: 0 % (ref 0.0–0.2)

## 2021-09-30 LAB — BASIC METABOLIC PANEL
Anion gap: 11 (ref 5–15)
Anion gap: 11 (ref 5–15)
BUN: 26 mg/dL — ABNORMAL HIGH (ref 8–23)
BUN: 24 mg/dL — ABNORMAL HIGH (ref 8–23)
CO2: 24 mmol/L (ref 22–32)
CO2: 25 mmol/L (ref 22–32)
Calcium: 9.2 mg/dL (ref 8.9–10.3)
Calcium: 9.1 mg/dL (ref 8.9–10.3)
Chloride: 90 mmol/L — ABNORMAL LOW (ref 98–111)
Chloride: 90 mmol/L — ABNORMAL LOW (ref 98–111)
Creatinine, Ser: 1.49 mg/dL — ABNORMAL HIGH (ref 0.61–1.24)
Creatinine, Ser: 1.41 mg/dL — ABNORMAL HIGH (ref 0.61–1.24)
GFR, Estimated: 49 mL/min — ABNORMAL LOW (ref >60)
GFR, Estimated: 52 mL/min — ABNORMAL LOW (ref >60)
Glucose, Bld: 100 mg/dL — ABNORMAL HIGH (ref 70–99)
Glucose, Bld: 104 mg/dL — ABNORMAL HIGH (ref 70–99)
Potassium: 2.7 mmol/L — CL (ref 3.5–5.1)
Potassium: 3.1 mmol/L — ABNORMAL LOW (ref 3.5–5.1)
Sodium: 125 mmol/L — ABNORMAL LOW (ref 135–145)
Sodium: 126 mmol/L — ABNORMAL LOW (ref 135–145)

## 2021-09-30 LAB — ECHOCARDIOGRAM COMPLETE
AR max vel: 2.25 cm2
AV Area VTI: 2.38 cm2
AV Area mean vel: 2.47 cm2
AV Mean grad: 9.7 mmHg
AV Peak grad: 16.2 mmHg
Ao pk vel: 2.01 m/s
Area-P 1/2: 4.31 cm2
Height: 69 in
S' Lateral: 4.3 cm
Weight: 2187.2 oz

## 2021-09-30 LAB — MAGNESIUM: Magnesium: 2 mg/dL (ref 1.7–2.4)

## 2021-09-30 LAB — PROTIME-INR
INR: 1.5 — ABNORMAL HIGH (ref 0.8–1.2)
INR: 1.5 — ABNORMAL HIGH (ref 0.8–1.2)
Prothrombin Time: 17.9 seconds — ABNORMAL HIGH (ref 11.4–15.2)
Prothrombin Time: 18.4 seconds — ABNORMAL HIGH (ref 11.4–15.2)

## 2021-09-30 LAB — BRAIN NATRIURETIC PEPTIDE: B Natriuretic Peptide: 840.7 pg/mL — ABNORMAL HIGH (ref 0.0–100.0)

## 2021-09-30 LAB — HEPARIN LEVEL (UNFRACTIONATED): Heparin Unfractionated: <0.1 IU/mL — ABNORMAL LOW (ref 0.30–0.70)

## 2021-09-30 MED ORDER — LORATADINE 10 MG PO TABS
10.0000 mg | ORAL_TABLET | Freq: Every day | ORAL | Status: DC | PRN
  Administered 2021-09-30 – 2021-10-03 (×4): 10 mg via ORAL
  Filled 2021-09-30 (×4): qty 1

## 2021-09-30 MED ORDER — POTASSIUM CHLORIDE CRYS ER 10 MEQ PO TBCR
40.0000 meq | EXTENDED_RELEASE_TABLET | Freq: Once | ORAL | Status: AC
  Administered 2021-09-30: 40 meq via ORAL
  Filled 2021-09-30: qty 4
  Filled 2021-09-30: qty 2

## 2021-09-30 MED ORDER — METOLAZONE 5 MG PO TABS
2.5000 mg | ORAL_TABLET | Freq: Once | ORAL | Status: AC
  Administered 2021-09-30: 2.5 mg via ORAL
  Filled 2021-09-30: qty 1

## 2021-09-30 MED ORDER — WARFARIN SODIUM 7.5 MG PO TABS
7.5000 mg | ORAL_TABLET | Freq: Once | ORAL | Status: AC
  Administered 2021-09-30: 7.5 mg via ORAL
  Filled 2021-09-30: qty 1

## 2021-09-30 MED ORDER — HEPARIN (PORCINE) 25000 UT/250ML-% IV SOLN
1400.0000 [IU]/h | INTRAVENOUS | Status: DC
  Administered 2021-09-30: 1000 [IU]/h via INTRAVENOUS
  Administered 2021-10-01: 1250 [IU]/h via INTRAVENOUS
  Administered 2021-10-02: 1400 [IU]/h via INTRAVENOUS
  Filled 2021-09-30 (×3): qty 250

## 2021-09-30 MED ORDER — POTASSIUM CHLORIDE 10 MEQ/100ML IV SOLN
10.0000 meq | INTRAVENOUS | Status: AC
Start: 2021-09-30 — End: 2021-09-30
  Administered 2021-09-30 (×2): 10 meq via INTRAVENOUS
  Filled 2021-09-30 (×2): qty 100

## 2021-09-30 MED ORDER — PERFLUTREN LIPID MICROSPHERE
1.0000 mL | INTRAVENOUS | Status: AC | PRN
  Administered 2021-09-30: 1 mL via INTRAVENOUS
  Filled 2021-09-30: qty 10

## 2021-09-30 MED ORDER — POTASSIUM CHLORIDE CRYS ER 10 MEQ PO TBCR
40.0000 meq | EXTENDED_RELEASE_TABLET | ORAL | Status: DC
  Administered 2021-09-30 (×2): 40 meq via ORAL
  Filled 2021-09-30 (×2): qty 4

## 2021-09-30 MED ORDER — WARFARIN SODIUM 7.5 MG PO TABS: 7.5000 mg | ORAL_TABLET | Freq: Once | ORAL | Status: DC

## 2021-09-30 MED ORDER — TOLVAPTAN 15 MG PO TABS
15.0000 mg | ORAL_TABLET | Freq: Once | ORAL | Status: AC
  Administered 2021-09-30: 15 mg via ORAL
  Filled 2021-09-30: qty 1

## 2021-09-30 MED ORDER — POTASSIUM CHLORIDE CRYS ER 10 MEQ PO TBCR
40.0000 meq | EXTENDED_RELEASE_TABLET | ORAL | Status: AC
  Administered 2021-09-30 – 2021-10-01 (×3): 40 meq via ORAL
  Filled 2021-09-30 (×3): qty 4

## 2021-09-30 MED ORDER — INFLUENZA VAC A&B SA ADJ QUAD 0.5 ML IM PRSY
0.5000 mL | PREFILLED_SYRINGE | INTRAMUSCULAR | Status: AC
  Administered 2021-10-02: 0.5 mL via INTRAMUSCULAR
  Filled 2021-09-30: qty 0.5

## 2021-09-30 MED ORDER — POTASSIUM CHLORIDE CRYS ER 20 MEQ PO TBCR: 40.0000 meq | EXTENDED_RELEASE_TABLET | ORAL | Status: DC

## 2021-09-30 MED ORDER — MEXILETINE HCL 200 MG PO CAPS
200.0000 mg | ORAL_CAPSULE | Freq: Two times a day (BID) | ORAL | Status: DC
  Filled 2021-09-30: qty 1

## 2021-09-30 NOTE — Plan of Care (Signed)
  Problem: Cardiovascular: Goal: Vascular access site(s) Level 0-1 will be maintained Outcome: Progressing

## 2021-09-30 NOTE — TOC Initial Note (Signed)
Transition of Care [facility]) - Initial/Assessment Note    Patient Details  Name: Lance Hicks MRN: [id number] Date of Birth: 1946/04/10  Transition of Care [facility] CM/SW Contact:    [place], [place] Phone Number: 09/30/2021, 12:04 PM  Clinical Narrative:                 HF CSW spoke with Mr. Willers at bedside and completed a very brief SDOH with the patient who denied having any social needs at this time. Mr. Zuckerman reported he does have a PCP and he can get to the pharmacy to pick up his medications as he is still driving. Mr. Purtee [place] and that he gets support from his family and children. CSW provided the patient with the social workers name and position and if anything changes to please reach out so that the CSW can provide support.  CSW will continue to follow throughout discharge.   Barriers to Discharge: Continued Medical Work up   Patient Goals and CMS Choice        Expected Discharge Plan and Services   In-house Referral: Clinical Social Work     Living arrangements for the past 2 months: Single Family Home                                      Prior Living Arrangements/Services Living arrangements for the past 2 months: Single Family Home Lives with:: Self Patient language and need for interpreter reviewed:: Yes        Need for Family Participation in Patient Care: No (Comment) Care giver support system in place?: No (comment)   Criminal Activity/Legal Involvement Pertinent to Current Situation/Hospitalization: No - Comment as needed  Activities of Daily Living Home Assistive Devices/Equipment: Eyeglasses ADL Screening (condition at time of admission) Patient's cognitive ability adequate to safely complete daily activities?: Yes Is the patient deaf or have difficulty hearing?: No Does the patient have difficulty seeing, even when wearing glasses/contacts?: No Does the patient have difficulty concentrating,  remembering, or making decisions?: No Patient able to express need for assistance with ADLs?: Yes Does the patient have difficulty dressing or bathing?: No Independently performs ADLs?: Yes (appropriate for developmental age) Does the patient have difficulty walking or climbing stairs?: Yes Weakness of Legs: Both Weakness of Arms/Hands: None  Permission Sought/Granted                  Emotional Assessment Appearance:: Appears stated age Attitude/Demeanor/Rapport: Engaged Affect (typically observed): Pleasant Orientation: : Oriented to Self, Oriented to Place, Oriented to  Time, Oriented to Situation   Psych Involvement: No (comment)  Admission diagnosis:  Acute on chronic diastolic (congestive) heart failure (HCC) [I50.33] Patient Active Problem List   Diagnosis Date Noted   Acute on chronic diastolic (congestive) heart failure ([place]) 09/29/2021   ILD (interstitial lung disease) ([place]) 08/04/2021   Fall 06/21/2021   History of COVID-19 04/05/2021   Asthma    Pneumonia due to COVID-19 virus 02/07/2021   Chronic respiratory failure with hypoxia ([place]) 07/29/2019   Cirrhosis of liver not due to alcohol ([place]) 07/01/2019   Anemia 07/01/2019   Hypotension, chronic 06/06/2018   Enterococcal bacteremia    Pulmonary edema    Strain of deltoid muscle, initial encounter    Pulmonary arterial hypertension ([place]) 02/20/2018   Hypokalemia due to excessive renal loss of potassium 02/18/2018  Left ureteral stone 01/23/2018   Anticoagulated on Coumadin 01/04/2018   Localized edema 01/04/2018   Leukocytoclastic vasculitis ([place]) 10/01/2017   Palpitations 10/01/2017   Hypersensitivity angiitis ([place]) 10/01/2017   Maculopapular rash 09/03/2017   Epidermoid cyst of skin 08/24/2017   Bilateral pleural effusion 08/09/2017   Pleural effusion, bilateral 08/09/2017   Supratherapeutic INR 07/26/2017   International normalized ratio (INR) raised 07/26/2017   Microscopic hematuria 06/20/2017    Prostate cancer screening 06/20/2017   Asymptomatic microscopic hematuria 06/20/2017   Atrial flutter ([place]) 03/29/2017   Chronic anticoagulation 03/29/2017   Coronary artery disease involving native coronary artery of native heart with angina pectoris ([place]) 03/29/2017   H/O maze procedure 03/29/2017   History of coronary artery bypass graft 03/29/2017   Hypertensive heart disease with heart failure ([place]) 03/29/2017   Nonsustained ventricular tachycardia 03/29/2017   Hyperlipidemia 03/29/2017   Syncope 03/29/2017   Coronary arteriosclerosis in native artery 03/29/2017   Hypertensive heart failure ([place]) 03/29/2017   Long term (current) use of anticoagulants 03/29/2017   Paroxysmal atrial fibrillation ([place]) 03/29/2017   Other hyperlipidemia 03/29/2017   Non-sustained ventricular tachycardia 03/29/2017   Hypertensive heart disease 03/29/2017   Hx of CABG 03/29/2017   Atrial fibrillation ([place]) 03/29/2017   Cough 10/17/2016   Chronic obstructive lung disease ([place]) 06/12/2016   Pulmonary nodules 06/12/2016   Multiple nodules of lung 06/12/2016   COPD (chronic obstructive pulmonary disease) ([place]) 06/12/2016   Anxiety 05/10/2016   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   Allergic rhinitis 04/28/2016   [name] 03/20/2016   S/P AVR 03/20/2016   S/P AVR (aortic valve replacement) 03/20/2016   Nasal discharge 02/25/2016   Post-nasal drainage 02/25/2016   Dyspnea 02/01/2016   Syncope and collapse 02/01/2016   Typical atrial flutter ([place]) 02/01/2016   Lumbar radicular pain 01/19/2016   Lumbar radiculopathy 01/19/2016   Backache 12/14/2015   Essential hypertension 12/14/2015   Chronic midline back pain 12/14/2015   Chronic prostatitis 07/23/2015   Nephrolithiasis 07/23/2015   Kidney stone 07/23/2015   Chronic atrial fibrillation ([place]) 07/23/2015   Kidney stones 07/23/2015   PCP:  [name], PA-C Pharmacy:   [facility] [place], [address] AT [place] [address] [place] [address] Phone: [phone] Fax: [phone]  [place], [place] [address] Phone: [phone] Fax: [phone]  [facility] [place], [place] [address] [place] [address] Phone: [phone] Fax: [phone]  [place] [address] [place] [address] Phone: [phone] Fax: [phone]     Social Determinants of Health (SDOH) Interventions Food Insecurity Interventions: Intervention Not Indicated Financial Strain Interventions: Intervention Not Indicated Housing Interventions: Intervention Not Indicated Transportation Interventions: Intervention Not Indicated  Readmission Risk Interventions No flowsheet data found.  Gaynor Ferreras, MSW, [place] Heart Failure Social Worker

## 2021-09-30 NOTE — Progress Notes (Addendum)
Center for warfarin Indication: atrial fibrillation and mechanical aortic valve  Allergies  Allergen Reactions   Pantoprazole Sodium Nausea Only    Gets gassy and starting itching like crazy Gets gassy and starting itching like crazy   Valium [Diazepam] Other (See Comments)    HA and Abd pain.   Tape Rash and Other (See Comments)    Surgical tape   Wound Dressing Adhesive Other (See Comments) and Rash    Surgical tape Surgical tape Surgical tape    Patient Measurements: Height: _0  (175.3 cm) Weight: 62 kg (136 lb 11.2 oz) IBW/kg (Calculated) : 70.7  Vital Signs: Temp: 98 F (36.7 C) (10/07 0742) Temp Source: Oral (10/07 0742) BP: 103/62 (10/07 0742) Pulse Rate: 59 (10/07 0742)  Labs: Recent Labs    09/27/21 1546 09/29/21 0853 09/29/21 0853 09/29/21 1013 09/29/21 1016 09/29/21 1017 09/30/21 0241  HGB  --  11.6*   < > 12.9*  12.9* 12.6* 12.2*  --   HCT  --  36.3*   < > 38.0*  38.0* 37.0* 36.0*  --   PLT  --  222  --   --   --   --   --   LABPROT 24.8* 19.8*  --   --   --   --   --   INR 2.3* 1.7*  --   --   --   --   --   CREATININE 1.72*  --   --   --   --   --  1.49*   < > = values in this interval not displayed.     Estimated Creatinine Clearance: 38.1 mL/min (A) (by C-G formula based on SCr of 1.49 mg/dL (H)).   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 In light of concurrent left nephrolithiasis, will check CT renal colic next year instead of renal US.    Anticoagulated on Coumadin 01/04/2018   Anxiety 05/10/2016   Last Assessment & Plan:  Doing well off of zoloft.   Asthma    Asymptomatic microscopic hematuria 06/20/2017   Last Assessment & Plan:  Had hematuria workup in [facility] in 2016 which negative CT  and cystoscopy. UA with 2+ blood last visit - we discussed recommendation for repeat workup at 5 years or if degree of hematuria progresses.    Atrial fibrillation ([place]) 03/29/2017   Atrial flutter ([place]) 03/29/2017   Backache 12/14/2015   Last Assessment & Plan:  Pain management referral for further evaluation.   Bilateral pleural effusion 08/09/2017   CHF (congestive heart failure) (HCC)    Chronic allergic rhinitis 04/28/2016   Last Assessment & Plan:  Continue astelin   Chronic anticoagulation 03/29/2017   Chronic atrial fibrillation ([place]) 07/23/2015   Last Assessment & Plan:  Coumadin and metoprolol, cardiology referral to establish care.   Chronic midline back pain 12/14/2015   Last Assessment & Plan:  Pain management referral for further evaluation.   Chronic obstructive lung disease ([place]) 06/12/2016   With hypoxia   Chronic prostatitis 07/23/2015   Last Assessment & Plan:  Has largely resolved since stopping bike riding. Recommend annual DRE AND PSA - will see back 12/2015 for annual screening, given 1st degree fhx. To call office for recurrent prostatitis symptoms.  Chronic respiratory failure with hypoxia ([place]) 07/29/2019   Cirrhosis of liver not due to alcohol ([place]) 07/01/2019   COPD (chronic obstructive pulmonary disease) ([place]) 06/12/2016   Coronary arteriosclerosis in native artery 03/29/2017   Coronary artery disease involving native coronary artery of native heart with angina pectoris ([place]) 03/29/2017   Cough 10/17/2016   Last Assessment & Plan:  Discussed typical course for acute viral illness. If symptoms worsen or fail to improve by 7-10d, delayed ATBs, fluids, rest, NSAIDs/APAP prn. Seek care if not improving. Needs earlier INR check due to ATBs.   Dyspnea 02/01/2016   Last Assessment & Plan:  Overall improving, eval by pulm, plan for CT, neg stress test with cardiology. Recent switch to carvedilol due to side effects.   Encounter for therapeutic drug monitoring 01/06/2019   Enterococcal  bacteremia    Epidermoid cyst of skin 08/24/2017   Essential hypertension 12/14/2015   Last Assessment & Plan:  Hypertension control: controlled  Medications: compliant Medication Management: as noted in orders Home blood pressure monitoring recommended additionally as needed for symptoms  The patient's care plan was reviewed and updated. Instructions and counseling were provided regarding patient goals and barriers. He was counseled to adopt a healthy lifestyle. Educational resources and self-management tools have been provided as charted in [facility] list.    H/O maze procedure 03/29/2017   [name] 03/29/2017   Overview:  2011   History of coronary artery bypass graft 03/29/2017   Hx of CABG 03/29/2017   Hyperlipidemia 03/29/2017   Hypersensitivity angiitis ([place]) 10/01/2017   Hypertensive heart disease 03/29/2017   Hypertensive heart disease with heart failure ([place]) 03/29/2017   Hypertensive heart failure ([place]) 03/29/2017   Hypokalemia due to excessive renal loss of potassium 02/18/2018   Hypotension, chronic 06/06/2018   International normalized ratio (INR) raised 07/26/2017   Kidney stone 07/23/2015   Kidney stones 07/23/2015   Overview:  x 3  Last Assessment & Plan:  By [place] has left nephrolithiasis, but not visible by KUB. Will check CT renal colic next year to assess both stone burden as well as to surveil AML.    Left ureteral stone 01/23/2018   Leukocytoclastic vasculitis ([place]) 10/01/2017   Localized edema 01/04/2018   Long term (current) use of anticoagulants 03/29/2017   Lumbar radicular pain 01/19/2016   Lumbar radiculopathy 01/19/2016   Maculopapular rash 09/03/2017   Microscopic hematuria 06/20/2017   Last Assessment & Plan:  Had hematuria workup in [facility] in 2016 which negative CT and cystoscopy. UA with 2+ blood last visit - we discussed recommendation for repeat workup at 5 years or if degree of hematuria progresses.    Multiple nodules of lung 06/12/2016   Nasal discharge 02/25/2016    Last Assessment & Plan:  Trial zyrtec and flonase   Nephrolithiasis 07/23/2015   Overview:  x 3  Last Assessment & Plan:  By [place] has left nephrolithiasis, but not visible by KUB. Will check CT renal colic next year to assess both stone burden as well as to surveil AML.   Overview:  x 3  Last Assessment & Plan:  Has 54m nonobstructing LUP stone - not visible by KUB.  Will check renal [place]2019 - he will contact office sooner if symptomatic.    Non-sustained ventricular tachycardia 03/29/2017   Nonsustained ventricular tachycardia 03/29/2017   Other hyperlipidemia 03/29/2017   Palpitations 10/01/2017   Pleural effusion, bilateral 08/09/2017   Pneumonia due to COVID-19 virus 02/07/2021   Post-nasal drainage  02/25/2016   Last Assessment & Plan:  Trial zyrtec and flonase   Prostate cancer screening 06/20/2017   Last Assessment & Plan:  Recommend continued annual CaP screening until within 10 years of life expectancy. Given good health and fhx of longevity, would anticipate CaP screening to continue until age 34.  PSA today and again in one year on day of visit.   Pulmonary arterial hypertension ([place]) 02/20/2018   Pulmonary edema    Pulmonary hypertension ([place]) 08/09/2017   Pulmonary nodules 06/12/2016   S/P AVR 03/20/2016   S/P AVR (aortic valve replacement) 03/20/2016   Strain of deltoid muscle, initial encounter    Supratherapeutic INR 07/26/2017   Syncope and collapse 02/01/2016   Typical atrial flutter ([place]) 02/01/2016   Assessment:  75 year old male with history of afib and Mechanical aortic valve replacement on chronic coumadin. Patient admitted post [place] this morning. INR low at 1.7 on admit.   INR this afternoon is now down to 1.5. Hgb down slightly from 12 to 11.3. No bleeding issues noted. Will discuss bridging with HF team.   Warfarin 2.671m daily except 537mon MWF  Goal of Therapy:  INR goal 2.5-3 Monitor platelets by anticoagulation protocol: Yes   Plan:  Warfarin 7.71m53monight  FraErin HearingharmD., BCPS Clinical Pharmacist 09/30/2021 8:24 AM  D/w cardiology, given low INR will add heparin drip.  09/30/2021 3:49 PM

## 2021-09-30 NOTE — Progress Notes (Addendum)
Advanced Heart Failure Rounding Note  PCP-Cardiologist: None   Subjective:    Neg about 500 cc after IV lasix last night  Hypokalemia overnight with K <2. K 2.7 today.   Na 135 > 125  Scr trending down, 1.49 today.   Notes some episodes of orthopnea overnight. Feels swollen in feet and ankles as well as abdomen.  Multiple pauses > 2 sec on tele overnight, one pause > 3 seconds around 5 am  RHC 10/06: Findings: RA = 12 RV = 71/9 PA = 79/20 (38) PCW = 21 (v = 30) Fick cardiac output/index = 7.0/3.14 PVR = 3.1 WU FA sat = 99% PA sat = 70%, 74% PAPi = 4.9    Assessment: 1. Moderate mixed PAH with mild to moderate volume overload    Objective:   Weight Range: 62 kg Body mass index is 20.19 kg/m.   Vital Signs:   Temp:  [97.3 F (36.3 C)-98.6 F (37 C)] 98 F (36.7 C) (10/07 0742) Pulse Rate:  [29-106] 59 (10/07 0742) Resp:  [10-26] 18 (10/07 0742) BP: (84-139)/(31-103) 103/62 (10/07 0742) SpO2:  [90 %-100 %] 94 % (10/07 0436) Weight:  [62 kg] 62 kg (10/07 0456) Last BM Date: 09/28/21  Weight change: Filed Weights   09/29/21 0801 09/30/21 0456  Weight: 60.3 kg 62 kg    Intake/Output:   Intake/Output Summary (Last 24 hours) at 09/30/2021 0813 Last data filed at 09/30/2021 0730 Gross per 24 hour  Intake 537.83 ml  Output 1075 ml  Net -537.17 ml      Physical Exam    General:  Sitting up in bed eating breakfast, no distress HEENT: Normal Neck: Supple. + JVP to jaw. Carotids 2+ bilat; no bruits. No lymphadenopathy or thyromegaly appreciated. Cor: PMI nondisplaced. Irregular rhythm. Mechanical S2.  Lungs: Clear Abdomen: Soft, nontender, + distended. No hepatosplenomegaly. No bruits or masses. Good bowel sounds. Extremities: No cyanosis, clubbing, rash, 2+ BLE edema Neuro: Alert & orientedx3, cranial nerves grossly intact. moves all 4 extremities w/o difficulty. Affect pleasant   Telemetry   Atrial fibrillation, rate 50s-60s, pauses > 2  seconds yesterday evening, 1 > 3 seconds around 5 am   Labs    CBC Recent Labs    09/29/21 0853 09/29/21 1013 09/29/21 1016 09/29/21 1017  WBC 5.0  --   --   --   HGB 11.6*   < > 12.6* 12.2*  HCT 36.3*   < > 37.0* 36.0*  MCV 78.7*  --   --   --   PLT 222  --   --   --    < > = values in this interval not displayed.   Basic Metabolic Panel Recent Labs    09/27/21 1546 09/29/21 1013 09/29/21 1017 09/30/21 0241  NA 127*   < > 135 125*  K 3.7   < > <2.0* 2.7*  CL 92*  --   --  90*  CO2 23  --   --  24  GLUCOSE 81  --   --  100*  BUN 28*  --   --  26*  CREATININE 1.72*  --   --  1.49*  CALCIUM 10.0  --   --  9.2   < > = values in this interval not displayed.   Liver Function Tests Recent Labs    09/29/21 1440  AST 36  ALT 17  ALKPHOS 60  BILITOT 2.2*  PROT 7.6  ALBUMIN 3.5   No results  for input(s): LIPASE, AMYLASE in the last 72 hours. Cardiac Enzymes No results for input(s): CKTOTAL, CKMB, CKMBINDEX, TROPONINI in the last 72 hours.  BNP: BNP (last 3 results) Recent Labs    09/12/21 1238 09/27/21 1546 09/30/21 0241  BNP 1,141.4* 953.2* 840.7*    ProBNP (last 3 results) Recent Labs    07/04/21 1607 08/16/21 1620  PROBNP 425.0* 601.0*     D-Dimer No results for input(s): DDIMER in the last 72 hours. Hemoglobin A1C No results for input(s): HGBA1C in the last 72 hours. Fasting Lipid Panel No results for input(s): CHOL, HDL, LDLCALC, TRIG, CHOLHDL, LDLDIRECT in the last 72 hours. Thyroid Function Tests No results for input(s): TSH, T4TOTAL, T3FREE, THYROIDAB in the last 72 hours.  Invalid input(s): FREET3  Other results:   Imaging    CARDIAC CATHETERIZATION  Result Date: 09/29/2021 Findings: RA = 12 RV = 71/9 PA = 79/20 (38) PCW = 21 (v = 30) Fick cardiac output/index = 7.0/3.14 PVR = 3.1 WU FA sat = 99% PA sat = 70%, 74% PAPi = 4.9 Assessment: 1. Moderate mixed PAH with mild to moderate volume overload Plan/Discussion: Admit for  diuresis. Glori Bickers, MD 11:03 AM    Medications:     Scheduled Medications:  aspirin EC  81 mg Oral q AM   famotidine  20 mg Oral Daily   furosemide  80 mg Intravenous BID   macitentan  10 mg Oral Daily   midodrine  10 mg Oral TID WC   rosuvastatin  5 mg Oral QPM   Selexipag  200 mcg Oral QPM   Selexipag  400 mcg Oral q AM   sodium chloride flush  3 mL Intravenous Q12H   spironolactone  25 mg Oral Daily   tadalafil  40 mg Oral q AM   Warfarin - Pharmacist Dosing Inpatient   Does not apply q1600    Infusions:  sodium chloride      PRN Medications: sodium chloride, acetaminophen, albuterol, ondansetron (ZOFRAN) IV, senna-docusate, sodium chloride flush     Assessment/Plan   1. Acute on chronic diastolic HF - not responding well to outpatient management.  - admitted 10/06 for IV diuresis. On IV lasix 80 mg BID. -500 cc after one dose last night. Volume overloaded on exam. NYHA 3b - Will give 2.5 mg metolazone but need to replace K first. Coordinating timing with PharmD. K < 2 on admit, 2.7 this am. Replacing with 10 mEq K IV X 2 and 40 mEq K q 4 hrs X 3. Will need labs this afternoon. - Na 135 > 125, may need tolvaptan - Consider SGLT2i - Continue spiro 25 mg daily - Unna boots   2. Pulmonary hypertension with cor pulmonale/RV failure - WHO Group I & II - Auto-immune serologies negative (checked twice). - ? Component of HHT/shunt/AVMs with late bubbles on bubble study.  - Echo with bubble study (05/16/18): EF 55-60% with "late bubbles" , Mechanical AVR stable with trivial perivalvular regurg, Mild MR, Severe LAE, Severe RV dilation and reduced function. No PFO. Mod TR, PA peak pressure 68 mm Hg - Echo (10/19): EF 55-60% mild LVH; s/p AVR with mean gradient 11 mmHg and trace AI; mild MR; severe biatrial enlargement; mild RVE; severe TR; severe pulmonary hypertension. RVSP 39mHG - Echo (4/22): EF 50-55%, RV ok, severe RVSP 61.6 mmHg, severe biatrial enlargement,  moderate TR, stable AVR, AV mean gradient 11 mmHg - RHC data as above - Ab u/s with cirrhosis but no evidence of  portal HTN. - On triple therapy with Macitentan and Adcirca. Uptravi dose at 400/200 limited by cramping.  Numbers not c/w need for IV therapies - NYHA IIIb as above. Diuresis   3. Chronic respiratory failure - [place] CT 05/16/18 with "dependent basilar predominant patchy subpleural reticulation and ground-glass attenuation with the suggestion of minimal associated traction bronchiolectasis. No frank honeycombing. Findings may indicate an interstitial lung disease such as early usual interstitial pneumonia (UIP) or fibrotic phase nonspecific interstitial pneumonia (NSIP)." - Followed by Dr. Lamonte Sakai. - No change.   4. Chronic AFL/pauses - Rate controlled, rate 50s-60s.  Now off metoprolol.  - Multiple pauses overnight, one > 3 seconds on tele around 5 am. Reports he was sleeping. Denies prior history of OSA - Continue coumadin with mechanical AVR.   5. Frequent PVCs - this may be contributing to HF - repeat echo pending - Up to 10 PVCs/min on telemetry - Considering mexiletine.  ? Timing with hypokalemia and pauses     6 CAD - s/p CABG 07/2017 with Dr Roderic Palau in [place].  - No s/s ischemia - Continue Crestor 5 mg daily (unable to tolerate atorva due to myalgias).  - Lipids followed by Dr. Bettina Gavia.   7. S/p mechanical AVR - Stable on echo 4/22. - Continue coumadin/ASA 81.  - INR goal is 2.5-3 per Dr Bettina Gavia. Dosing per pharmacy. - Aware of need for SBE prophylaxis.   8. Leukocytoclastic vasculitis - F/u with Rheumatology.  - No changes.   9. Cirrhosis - US Abdomen RUQ 05/16/18 - s/p cholecystectomy. + hepatic cirrhosis. Mild ascites.  - Liver Doppler 05/16/18 - No hepatic, splenic, or portal venous thrombosis or occlusion. Mild ascites. - Continue midodrine 15 mg bid (it was 10 tid but he says it works better this way)   10. Hyponatremia -Na down from 135 > 125 -May need  tolvaptan as above  11. Hypokalemia -K markedly low. Up to 2.7 this am. Replacing aggressively as above  Length of Stay: [place], [place], PA-C  09/30/2021, 8:13 AM  Advanced Heart Failure Team Pager [phone] (M-F; 7a - 5p)  Please contact [facility] for night-coverage after hours (5p -7a ) and weekends on [email or web address]  Patient seen and examined with the above-signed Advanced Practice Provider and/or Housestaff. I personally reviewed laboratory data, imaging studies and relevant notes. I independently examined the patient and formulated the important aspects of the plan. I have edited the note to reflect any of my changes or salient points. I have personally discussed the plan with the patient and/or family.   Only mild diuresis overnight. Multiple short pauses on tele. Asymptomatic. PVC burden improved. K and Na are low. INR 1.5  Echo EF 45-50% RV normal Personally reviewed  General:  Weak appearing. No resp difficulty HEENT: normal Neck: supple. JVP 8-10 Carotids 2+ bilat; no bruits. No lymphadenopathy or thryomegaly appreciated. Cor: PMI nondisplaced. Irregular rate & rhythm. Mech s2 2/6 SEM Lungs: clear Abdomen: soft, nontender, nondistended. No hepatosplenomegaly. No bruits or masses. Good bowel sounds. Extremities: no cyanosis, clubbing, rash, 1-2+ edema Neuro: alert & orientedx3, cranial nerves grossly intact. moves all 4 extremities w/o difficulty. Affect pleasant  Remains tenuous. Continue IV diuresis. Supp K. Give 1 dose tolvaptan. Continue to follow on tele. Start heparin for AVR. Discussed dosing with PharmD personally.  Glori Bickers, MD  6:07 PM

## 2021-09-30 NOTE — Plan of Care (Signed)
  Problem: Activity: °Goal: Risk for activity intolerance will decrease °Outcome: Progressing °  °Problem: Nutrition: °Goal: Adequate nutrition will be maintained °Outcome: Progressing °  °Problem: Coping: °Goal: Level of anxiety will decrease °Outcome: Progressing °  °

## 2021-09-30 NOTE — Progress Notes (Signed)
CRITICAL VALUE ALERT  Critical value received:  Potassium=2.7  Date of notification:  09/30/21  Time of notification:  7034  Critical value read back:Yes.    Nurse who received alert:  Freddy Finner  RN  MD notified (1st page):  09/30/21  Time of first page:  0320  MD notified (2nd page):  Time of second page:  Responding MD:  Dr.Khan  Time MD responded:  [phone]

## 2021-10-01 DIAGNOSIS — I5033 Acute on chronic diastolic (congestive) heart failure: Secondary | ICD-10-CM | POA: Diagnosis not present

## 2021-10-01 LAB — BASIC METABOLIC PANEL
Anion gap: 11 (ref 5–15)
BUN: 26 mg/dL — ABNORMAL HIGH (ref 8–23)
CO2: 26 mmol/L (ref 22–32)
Calcium: 9.9 mg/dL (ref 8.9–10.3)
Chloride: 92 mmol/L — ABNORMAL LOW (ref 98–111)
Creatinine, Ser: 1.65 mg/dL — ABNORMAL HIGH (ref 0.61–1.24)
GFR, Estimated: 43 mL/min — ABNORMAL LOW (ref >60)
Glucose, Bld: 117 mg/dL — ABNORMAL HIGH (ref 70–99)
Potassium: 4.2 mmol/L (ref 3.5–5.1)
Sodium: 129 mmol/L — ABNORMAL LOW (ref 135–145)

## 2021-10-01 LAB — PROTIME-INR
INR: 1.6 — ABNORMAL HIGH (ref 0.8–1.2)
Prothrombin Time: 19.3 seconds — ABNORMAL HIGH (ref 11.4–15.2)

## 2021-10-01 LAB — HEPARIN LEVEL (UNFRACTIONATED)
Heparin Unfractionated: 0.3 IU/mL (ref 0.30–0.70)
Heparin Unfractionated: 0.21 IU/mL — ABNORMAL LOW (ref 0.30–0.70)

## 2021-10-01 LAB — MAGNESIUM: Magnesium: 2.2 mg/dL (ref 1.7–2.4)

## 2021-10-01 MED ORDER — MIDODRINE HCL 5 MG PO TABS
15.0000 mg | ORAL_TABLET | Freq: Three times a day (TID) | ORAL | Status: DC
  Administered 2021-10-01 – 2021-10-04 (×9): 15 mg via ORAL
  Filled 2021-10-01 (×9): qty 3

## 2021-10-01 MED ORDER — FAMOTIDINE 20 MG PO TABS
20.0000 mg | ORAL_TABLET | Freq: Every day | ORAL | Status: DC
  Administered 2021-10-02 – 2021-10-03 (×2): 20 mg via ORAL
  Filled 2021-10-01 (×2): qty 1

## 2021-10-01 MED ORDER — WARFARIN SODIUM 5 MG PO TABS
5.0000 mg | ORAL_TABLET | Freq: Once | ORAL | Status: AC
  Administered 2021-10-01: 5 mg via ORAL
  Filled 2021-10-01: qty 1

## 2021-10-01 NOTE — Progress Notes (Signed)
ANTICOAGULATION CONSULT NOTE - Follow Up Consult  Pharmacy Consult for heparin Indication: atrial fibrillation and mechanical aortic valve  Allergies  Allergen Reactions   Pantoprazole Sodium Nausea Only    Gets gassy and starting itching like crazy Gets gassy and starting itching like crazy   Valium [Diazepam] Other (See Comments)    HA and Abd pain.   Tape Rash and Other (See Comments)    Surgical tape   Wound Dressing Adhesive Other (See Comments) and Rash    Surgical tape Surgical tape Surgical tape    Patient Measurements: Height: _0  (175.3 cm) Weight: 60.1 kg (132 lb 9.6 oz) IBW/kg (Calculated) : 70.7 Heparin Dosing Weight: 6  Vital Signs: Temp: 98 F (36.7 C) (10/08 1357) Temp Source: Oral (10/08 1357) BP: 109/61 (10/08 1357) Pulse Rate: 63 (10/08 1357)  Labs: Recent Labs    09/29/21 0853 09/29/21 1013 09/29/21 1017 09/30/21 0241 09/30/21 1254 09/30/21 2331 10/01/21 0755 10/01/21 1806  HGB 11.6*   < > 12.2*  --  11.3* 11.9*  --   --   HCT 36.3*   < > 36.0*  --  35.4* 37.0*  --   --   PLT 222  --   --   --  192 226  --   --   LABPROT 19.8*  --   --   --  18.4* 17.9* 19.3*  --   INR 1.7*  --   --   --  1.5* 1.5* 1.6*  --   HEPARINUNFRC  --   --   --   --   --  <0.10* 0.30 0.21*  CREATININE  --   --   --  1.49* 1.41* 1.65*  --   --    < > = values in this interval not displayed.     Estimated Creatinine Clearance: 33.4 mL/min (A) (by C-G formula based on SCr of 1.65 mg/dL (H)).  Assessment:  75 year old male with history of afib and Mechanical aortic valve replacement on chronic coumadin. Patient admitted post [place]. PTA warfarin schedule is 2.5 mg Sun, Mon, Tue, Thu, Fri and 5 mg on Wed and Sat. INR low at 1.7 on admit.   Patient was started on a heparin-warfarin bridge on 10/7.  -heparin level up to 0.21 on 1250 units/hr   Goal of Therapy:  Heparin level 0.3-0.7 units/ml INR goal 2.5-3 Monitor platelets by anticoagulation protocol: Yes    Plan:  Increase heparin rate 1400 units/hr Daily heparin level and CBC  Hildred Laser, PharmD Clinical Pharmacist **Pharmacist phone directory can now be found on [email or web address] ([email or web address]).  Listed under [place].

## 2021-10-01 NOTE — Progress Notes (Signed)
ANTICOAGULATION CONSULT NOTE - Follow Up Consult  Pharmacy Consult for heparin Indication: atrial fibrillation and mechanical aortic valve  Allergies  Allergen Reactions   Pantoprazole Sodium Nausea Only    Gets gassy and starting itching like crazy Gets gassy and starting itching like crazy   Valium [Diazepam] Other (See Comments)    HA and Abd pain.   Tape Rash and Other (See Comments)    Surgical tape   Wound Dressing Adhesive Other (See Comments) and Rash    Surgical tape Surgical tape Surgical tape    Patient Measurements: Height: _0  (175.3 cm) Weight: 60.1 kg (132 lb 9.6 oz) IBW/kg (Calculated) : 70.7 Heparin Dosing Weight: 6  Vital Signs: Temp: 97.7 F (36.5 C) (10/08 0535) Temp Source: Oral (10/08 0535) BP: 96/63 (10/08 0535) Pulse Rate: 69 (10/08 0535)  Labs: Recent Labs    09/29/21 0853 09/29/21 1013 09/29/21 1017 09/30/21 0241 09/30/21 1254 09/30/21 2331 10/01/21 0755  HGB 11.6*   < > 12.2*  --  11.3* 11.9*  --   HCT 36.3*   < > 36.0*  --  35.4* 37.0*  --   PLT 222  --   --   --  192 226  --   LABPROT 19.8*  --   --   --  18.4* 17.9*  --   INR 1.7*  --   --   --  1.5* 1.5*  --   HEPARINUNFRC  --   --   --   --   --  <0.10* 0.30  CREATININE  --   --   --  1.49* 1.41* 1.65*  --    < > = values in this interval not displayed.    Estimated Creatinine Clearance: 33.4 mL/min (A) (by C-G formula based on SCr of 1.65 mg/dL (H)).  Assessment:  75 year old male with history of afib and Mechanical aortic valve replacement on chronic coumadin. Patient admitted post [place]. PTA warfarin schedule is 2.5 mg Sun, Mon, Tue, Thu, Fri and 5 mg on Wed and Sat. INR low at 1.7 on admit.   Patient was started on a heparin-warfarin bridge on 10/7. Today's HL is at the lower end of therapeutic (HL=0.30) and INR is below goal at 1.6. Will slightly increase his heparin rate to ensure patient remains therapeutic.  Per conversation with RN, no s/sx of bleeding or issues with  infusion. CBC remains stable.  Goal of Therapy:  Heparin level 0.3-0.7 units/ml INR goal 2.5-3 Monitor platelets by anticoagulation protocol: Yes   Plan:  Increase heparin rate to 1250 units/hr Warfarin 5 mg x1 Check anti-Xa level in 8 hours and daily while on heparin Continue to monitor H&H and platelets  Pauletta Browns, Pharm.D. PGY-1 Pharmacy Resident [phone] 10/01/2021 9:57 AM

## 2021-10-01 NOTE — Progress Notes (Signed)
[place] for heparin Indication: atrial fibrillation and mechanical aortic valve  Allergies  Allergen Reactions   Pantoprazole Sodium Nausea Only    Gets gassy and starting itching like crazy Gets gassy and starting itching like crazy   Valium [Diazepam] Other (See Comments)    HA and Abd pain.   Tape Rash and Other (See Comments)    Surgical tape   Wound Dressing Adhesive Other (See Comments) and Rash    Surgical tape Surgical tape Surgical tape    Patient Measurements: Height: _0  (175.3 cm) Weight: 62 kg (136 lb 11.2 oz) IBW/kg (Calculated) : 70.7  Vital Signs: Temp: 97.3 F (36.3 C) (10/07 2108) Temp Source: Oral (10/07 2108) BP: 116/63 (10/07 2108) Pulse Rate: 85 (10/07 2108)  Labs: Recent Labs    09/29/21 0853 09/29/21 1013 09/29/21 1017 09/30/21 0241 09/30/21 1254 09/30/21 2331  HGB 11.6*   < > 12.2*  --  11.3* 11.9*  HCT 36.3*   < > 36.0*  --  35.4* 37.0*  PLT 222  --   --   --  192 226  LABPROT 19.8*  --   --   --  18.4* 17.9*  INR 1.7*  --   --   --  1.5* 1.5*  HEPARINUNFRC  --   --   --   --   --  <0.10*  CREATININE  --   --   --  1.49* 1.41*  --    < > = values in this interval not displayed.     Estimated Creatinine Clearance: 40.3 mL/min (A) (by C-G formula based on SCr of 1.41 mg/dL (H)).   Assessment: 75 year old male with history of afib and mechanical AVR, INR subtherapeutic, for heparin  Warfarin 2.21m daily except 525mon MWF  Goal of Therapy:  Heparin level 0.3-0.7 units/mL INR goal 2.5-3 Monitor platelets by anticoagulation protocol: Yes   Plan:  Increase Heparin  1200 units/hr Follow-up am labs.   GrPhillis KnackPharmD, BCPS  10/01/2021 12:12 AM

## 2021-10-01 NOTE — Progress Notes (Signed)
Advanced Heart Failure Rounding Note  PCP-Cardiologist: None   Subjective:    Remains on IV lasix. Excellent diuresis with 3.5 L out.   SCr 1.4 -> 1.65. Remains on IV heparin for AVR  INR 1.6   Objective:   Weight Range: 60.1 kg Body mass index is 19.58 kg/m.   Vital Signs:   Temp:  [97.3 F (36.3 C)-98.3 F (36.8 C)] 98 F (36.7 C) (10/08 1357) Pulse Rate:  [59-85] 63 (10/08 1357) Resp:  [16-18] 16 (10/08 0535) BP: (95-116)/(55-64) 109/61 (10/08 1357) SpO2:  [97 %-99 %] 98 % (10/08 1357) Weight:  [60.1 kg] 60.1 kg (10/08 0535) Last BM Date: 09/30/21  Weight change: Filed Weights   09/29/21 0801 09/30/21 0456 10/01/21 0535  Weight: 60.3 kg 62 kg 60.1 kg    Intake/Output:   Intake/Output Summary (Last 24 hours) at 10/01/2021 1404 Last data filed at 10/01/2021 1100 Gross per 24 hour  Intake 46.81 ml  Output 2545 ml  Net -2498.19 ml       Physical Exam    General:  Sitting up in bed. No resp difficulty HEENT: normal Neck: supple. JVP 6 Carotids 2+ bilat; no bruits. No lymphadenopathy or thryomegaly appreciated. Cor: PMI nondisplaced. Irregular rate & rhythm. Mech s2 Lungs: clear Abdomen: soft, nontender, nondistended. No hepatosplenomegaly. No bruits or masses. Good bowel sounds. Extremities: no cyanosis, clubbing, rash, trace-1+ edema with severe varicose veins Neuro: alert & orientedx3, cranial nerves grossly intact. moves all 4 extremities w/o difficulty. Affect pleasant   Telemetry   Atrial fibrillation, rate 50s-60s Personally reviewed   Labs    CBC Recent Labs    09/30/21 1254 09/30/21 2331  WBC 4.5 5.2  HGB 11.3* 11.9*  HCT 35.4* 37.0*  MCV 79.4* 78.7*  PLT 192 546    Basic Metabolic Panel Recent Labs    09/30/21 1254 09/30/21 2331  NA 126* 129*  K 3.1* 4.2  CL 90* 92*  CO2 25 26  GLUCOSE 104* 117*  BUN 24* 26*  CREATININE 1.41* 1.65*  CALCIUM 9.1 9.9  MG 2.0 2.2    Liver Function Tests Recent Labs     09/29/21 1440  AST 36  ALT 17  ALKPHOS 60  BILITOT 2.2*  PROT 7.6  ALBUMIN 3.5    No results for input(s): LIPASE, AMYLASE in the last 72 hours. Cardiac Enzymes No results for input(s): CKTOTAL, CKMB, CKMBINDEX, TROPONINI in the last 72 hours.  BNP: BNP (last 3 results) Recent Labs    09/12/21 1238 09/27/21 1546 09/30/21 0241  BNP 1,141.4* 953.2* 840.7*     ProBNP (last 3 results) Recent Labs    07/04/21 1607 08/16/21 1620  PROBNP 425.0* 601.0*      D-Dimer No results for input(s): DDIMER in the last 72 hours. Hemoglobin A1C No results for input(s): HGBA1C in the last 72 hours. Fasting Lipid Panel No results for input(s): CHOL, HDL, LDLCALC, TRIG, CHOLHDL, LDLDIRECT in the last 72 hours. Thyroid Function Tests No results for input(s): TSH, T4TOTAL, T3FREE, THYROIDAB in the last 72 hours.  Invalid input(s): FREET3  Other results:   Imaging    ECHOCARDIOGRAM COMPLETE  Result Date: 09/30/2021    ECHOCARDIOGRAM REPORT   Patient Name:   Lance Hicks Date of Exam: 09/30/2021 Medical Rec #:  [id number]         Height:       69.0 in Accession #:    [id number]        Weight:  136.7 lb Date of Birth:  1946-10-23         BSA:          1.758 m Patient Age:    75 years          BP:           112/69 mmHg Patient Gender: M                 HR:           49 bpm. Exam Location:  Inpatient Procedure: 2D Echo, Color Doppler, Cardiac Doppler and Intracardiac            Opacification Agent Indications:    congestive heart failure  History:        Patient has prior history of Echocardiogram examinations, most                 recent 04/12/2021. CAD, COPD, Arrythmias:Atrial Fibrillation;                 Risk Factors:Dyslipidemia and Hypertension. S/p CABG, AVR wih                 #25 Carbomedics valve in 2010,.                 Aortic Valve: Carbomedics bileaflet valve is present in the                 aortic position. Procedure Date: 2010.  Sonographer:    Melissa Morford RDCS  (AE, PE) Referring Phys: [id number] LINDSAY NICOLE [facility] IMPRESSIONS  1. Left ventricular ejection fraction, by estimation, is 45 to 50%. The left ventricle has mildly decreased function. The left ventricle demonstrates global hypokinesis. There is mild left ventricular hypertrophy. Left ventricular diastolic parameters are consistent with Grade II diastolic dysfunction (pseudonormalization).  2. Right ventricular systolic function is low normal. The right ventricular size is mildly enlarged.  3. Left atrial size was mildly dilated.  4. Right atrial size was severely dilated.  5. The mitral valve is normal in structure. No evidence of mitral valve regurgitation. No evidence of mitral stenosis. Severe mitral annular calcification.  6. Tricuspid valve regurgitation is moderate to severe.  7. The aortic valve has been repaired/replaced. Aortic valve regurgitation is not visualized. No aortic stenosis is present. There is a Carbomedics bileaflet valve present in the aortic position. Procedure Date: 2010. Echo findings are consistent with normal structure and function of the aortic valve prosthesis. Aortic valve mean gradient measures 9.7 mmHg. Aortic valve Vmax measures 2.01 m/s.  8. There is Moderate (Grade III) plaque involving the ascending aorta.  9. The inferior vena cava is dilated in size with <50% respiratory variability, suggesting right atrial pressure of 15 mmHg. FINDINGS  Left Ventricle: Left ventricular ejection fraction, by estimation, is 45 to 50%. The left ventricle has mildly decreased function. The left ventricle demonstrates global hypokinesis. The left ventricular internal cavity size was normal in size. There is  mild left ventricular hypertrophy. Left ventricular diastolic parameters are consistent with Grade II diastolic dysfunction (pseudonormalization). Right Ventricle: The right ventricular size is mildly enlarged. No increase in right ventricular wall thickness. Right ventricular systolic function  is low normal. Left Atrium: Left atrial size was mildly dilated. Right Atrium: Right atrial size was severely dilated. Pericardium: There is no evidence of pericardial effusion. Mitral Valve: The mitral valve is normal in structure. There is moderate thickening of the mitral valve leaflet(s). There is severe calcification of the mitral valve  leaflet(s). Severe mitral annular calcification. No evidence of mitral valve regurgitation. No evidence of mitral valve stenosis. Tricuspid Valve: The tricuspid valve is normal in structure. Tricuspid valve regurgitation is moderate to severe. No evidence of tricuspid stenosis. Aortic Valve: The aortic valve has been repaired/replaced. Aortic valve regurgitation is not visualized. No aortic stenosis is present. Aortic valve mean gradient measures 9.7 mmHg. Aortic valve peak gradient measures 16.2 mmHg. Aortic valve area, by VTI  measures 2.38 cm. There is a Carbomedics bileaflet valve present in the aortic position. Procedure Date: 2010. Echo findings are consistent with normal structure and function of the aortic valve prosthesis. Pulmonic Valve: The pulmonic valve was normal in structure. Pulmonic valve regurgitation is not visualized. No evidence of pulmonic stenosis. Aorta: The aortic root is normal in size and structure. There is moderate (Grade III) plaque involving the ascending aorta. Venous: The inferior vena cava is dilated in size with less than 50% respiratory variability, suggesting right atrial pressure of 15 mmHg. IAS/Shunts: No atrial level shunt detected by color flow Doppler.  LEFT VENTRICLE PLAX 2D LVIDd:         5.50 cm   Diastology LVIDs:         4.30 cm   LV e' medial:    6.42 cm/s LV PW:         1.10 cm   LV E/e' medial:  14.2 LV IVS:        1.10 cm   LV e' lateral:   7.62 cm/s LVOT diam:     2.20 cm   LV E/e' lateral: 12.0 LV SV:         95 LV SV Index:   54 LVOT Area:     3.80 cm  RIGHT VENTRICLE TAPSE (M-mode): 0.7 cm LEFT ATRIUM             Index         RIGHT ATRIUM           Index LA diam:        4.70 cm 2.67 cm/m   RA Area:     31.60 cm LA Vol (A2C):   73.0 ml 41.54 ml/m  RA Volume:   117.00 ml 66.57 ml/m LA Vol (A4C):   49.3 ml 28.05 ml/m LA Biplane Vol: 60.6 ml 34.48 ml/m  AORTIC VALVE AV Area (Vmax):    2.25 cm AV Area (Vmean):   2.47 cm AV Area (VTI):     2.38 cm AV Vmax:           201.33 cm/s AV Vmean:          146.000 cm/s AV VTI:            0.400 m AV Peak Grad:      16.2 mmHg AV Mean Grad:      9.7 mmHg LVOT Vmax:         119.00 cm/s LVOT Vmean:        94.900 cm/s LVOT VTI:          0.250 m LVOT/AV VTI ratio: 0.63  AORTA Ao Root diam: 3.50 cm MITRAL VALVE MV Area (PHT): 4.31 cm    SHUNTS MV Decel Time: 176 msec    Systemic VTI:  0.25 m MV E velocity: 91.30 cm/s  Systemic Diam: 2.20 cm MV A velocity: 31.80 cm/s MV E/A ratio:  2.87 Candee Furbish MD Electronically signed by Candee Furbish MD Signature Date/Time: 09/30/2021/3:49:10 PM    Final  Medications:     Scheduled Medications:  aspirin EC  81 mg Oral q AM   famotidine  20 mg Oral Daily   furosemide  80 mg Intravenous BID   influenza vaccine adjuvanted  0.5 mL Intramuscular Tomorrow-1000   macitentan  10 mg Oral Daily   midodrine  10 mg Oral TID WC   rosuvastatin  5 mg Oral QPM   Selexipag  200 mcg Oral QPM   Selexipag  400 mcg Oral q AM   sodium chloride flush  3 mL Intravenous Q12H   spironolactone  25 mg Oral Daily   tadalafil  40 mg Oral q AM   warfarin  5 mg Oral ONCE-1600   Warfarin - Pharmacist Dosing Inpatient   Does not apply q1600    Infusions:  sodium chloride     heparin 1,250 Units/hr (10/01/21 1026)    PRN Medications: sodium chloride, acetaminophen, albuterol, loratadine, ondansetron (ZOFRAN) IV, senna-docusate, sodium chloride flush     Assessment/Plan   1. Acute on chronic diastolic HF - not responding well to outpatient management.  - admitted 10/06 for IV diuresis.  - Has responded well to IV lasix. Weight down 3 pounds.  - With bump  in SCr would switch back to po  - Consider SGLT2i - Continue spiro 25 mg daily   2. Pulmonary hypertension with cor pulmonale/RV failure - WHO Group I & II - Auto-immune serologies negative (checked twice). - ? Component of HHT/shunt/AVMs with late bubbles on bubble study.  - Echo with bubble study (05/16/18): EF 55-60% with "late bubbles" , Mechanical AVR stable with trivial perivalvular regurg, Mild MR, Severe LAE, Severe RV dilation and reduced function. No PFO. Mod TR, PA peak pressure 68 mm Hg - Echo (4/22): EF 50-55%, RV ok, severe RVSP 61.6 mmHg, severe biatrial enlargement, moderate TR, stable AVR, AV mean gradient 11 mmHg - Echo 10/22 EF 45-50 RV low normal  - RHC data as above - Ab u/s with cirrhosis but no evidence of portal HTN. - On triple therapy with Macitentan and Adcirca. Uptravi dose at 400/200 limited by cramping.  Numbers not c/w need for IV therapies   3. Chronic respiratory failure - [place] 05/16/18 with "dependent basilar predominant patchy subpleural reticulation and ground-glass attenuation with the suggestion of minimal associated traction bronchiolectasis. No frank honeycombing. Findings may indicate an interstitial lung disease such as early usual interstitial pneumonia (UIP) or fibrotic phase nonspecific interstitial pneumonia (NSIP)." - Followed by Dr. Lamonte Sakai. - Stable   4. Chronic AFL/pauses - Rate controlled, rate 50s-60s.  Now off metoprolol.  - Multiple pauses overnight, one > 3 seconds on tele around 5 am. Reports he was sleeping. Denies prior history of OSA - Continue coumadin with mechanical AVR.   5. Frequent PVCs - this may be contributing to HF - repeat echo pending - Up to 10 PVCs/min on telemetry - Considering mexiletine.    6 CAD - s/p CABG 07/2017 with Dr Roderic Palau in [place].  - No s/s ischemia - Continue Crestor 5 mg daily (unable to tolerate atorva due to myalgias).  - Lipids followed by Dr. Bettina Gavia.   7. S/p mechanical AVR - Stable on  echo 4/22. - On heparin. INR 1.6. Stop heparin when INR 1.8 or greater   8. Leukocytoclastic vasculitis - F/u with Rheumatology.  - No changes.   9. Cirrhosis - US Abdomen RUQ 05/16/18 - s/p cholecystectomy. + hepatic cirrhosis. Mild ascites.  - Liver Doppler 05/16/18 - No hepatic, splenic,  or portal venous thrombosis or occlusion. Mild ascites. - Continue midodrine 15 mg bid (it was 10 tid but he says it works better this way)   10. Hyponatremia -Improved with tolvaptan - continue free water restrict  11. Hypokalemia - improved with supp. K 4.2 today  Length of Stay: 2  Glori Bickers, MD  10/01/2021, 2:04 PM  Advanced Heart Failure Team Pager [phone] (M-F; 7a - 5p)  Please contact [facility] for night-coverage after hours (5p -7a ) and weekends on [email or web address]

## 2021-10-02 ENCOUNTER — Other Ambulatory Visit (HOSPITAL_COMMUNITY): Payer: Self-pay | Admitting: Internal Medicine

## 2021-10-02 DIAGNOSIS — I5033 Acute on chronic diastolic (congestive) heart failure: Secondary | ICD-10-CM | POA: Diagnosis not present

## 2021-10-02 LAB — CBC
HCT: 32.9 % — ABNORMAL LOW (ref 39.0–52.0)
Hemoglobin: 10.6 g/dL — ABNORMAL LOW (ref 13.0–17.0)
MCH: 25.1 pg — ABNORMAL LOW (ref 26.0–34.0)
MCHC: 32.2 g/dL (ref 30.0–36.0)
MCV: 78 fL — ABNORMAL LOW (ref 80.0–100.0)
Platelets: 217 10*3/uL (ref 150–400)
RBC: 4.22 MIL/uL (ref 4.22–5.81)
RDW: 22.5 % — ABNORMAL HIGH (ref 11.5–15.5)
WBC: 4.9 10*3/uL (ref 4.0–10.5)
nRBC: 0 % (ref 0.0–0.2)

## 2021-10-02 LAB — BASIC METABOLIC PANEL
Anion gap: 11 (ref 5–15)
BUN: 30 mg/dL — ABNORMAL HIGH (ref 8–23)
CO2: 23 mmol/L (ref 22–32)
Calcium: 9.1 mg/dL (ref 8.9–10.3)
Chloride: 91 mmol/L — ABNORMAL LOW (ref 98–111)
Creatinine, Ser: 1.48 mg/dL — ABNORMAL HIGH (ref 0.61–1.24)
GFR, Estimated: 49 mL/min — ABNORMAL LOW (ref >60)
Glucose, Bld: 96 mg/dL (ref 70–99)
Potassium: 3.2 mmol/L — ABNORMAL LOW (ref 3.5–5.1)
Sodium: 125 mmol/L — ABNORMAL LOW (ref 135–145)

## 2021-10-02 LAB — HEPARIN LEVEL (UNFRACTIONATED): Heparin Unfractionated: 0.57 IU/mL (ref 0.30–0.70)

## 2021-10-02 LAB — PROTIME-INR
INR: 2 — ABNORMAL HIGH (ref 0.8–1.2)
Prothrombin Time: 22.9 seconds — ABNORMAL HIGH (ref 11.4–15.2)

## 2021-10-02 MED ORDER — TORSEMIDE 20 MG PO TABS
40.0000 mg | ORAL_TABLET | Freq: Every day | ORAL | Status: DC
  Administered 2021-10-02 – 2021-10-03 (×2): 40 mg via ORAL
  Filled 2021-10-02 (×2): qty 2

## 2021-10-02 MED ORDER — SPIRONOLACTONE 12.5 MG HALF TABLET: 12.5000 mg | ORAL_TABLET | Freq: Every day | ORAL | Status: DC

## 2021-10-02 MED ORDER — TOLVAPTAN 15 MG PO TABS
30.0000 mg | ORAL_TABLET | Freq: Once | ORAL | Status: AC
  Administered 2021-10-02: 30 mg via ORAL
  Filled 2021-10-02: qty 2

## 2021-10-02 MED ORDER — WARFARIN SODIUM 5 MG PO TABS
5.0000 mg | ORAL_TABLET | Freq: Once | ORAL | Status: AC
  Administered 2021-10-02: 5 mg via ORAL
  Filled 2021-10-02: qty 1

## 2021-10-02 MED ORDER — POTASSIUM CHLORIDE CRYS ER 10 MEQ PO TBCR
40.0000 meq | EXTENDED_RELEASE_TABLET | ORAL | Status: AC
  Administered 2021-10-02 (×2): 40 meq via ORAL
  Filled 2021-10-02 (×2): qty 4

## 2021-10-02 MED ORDER — POTASSIUM CHLORIDE CRYS ER 20 MEQ PO TBCR
40.0000 meq | EXTENDED_RELEASE_TABLET | ORAL | Status: DC
  Filled 2021-10-02 (×2): qty 2

## 2021-10-02 MED ORDER — SPIRONOLACTONE 12.5 MG HALF TABLET
12.5000 mg | ORAL_TABLET | Freq: Every day | ORAL | Status: DC
  Administered 2021-10-03: 12.5 mg via ORAL
  Filled 2021-10-02: qty 1

## 2021-10-02 NOTE — Progress Notes (Signed)
Lance Hicks returned call and made aware. Orders received for repeat labs after 8pm dose of Potassium. Passed along in report. Patient updated

## 2021-10-02 NOTE — Progress Notes (Signed)
ANTICOAGULATION CONSULT NOTE - Follow Up Consult  Pharmacy Consult for heparin Indication: atrial fibrillation and mechanical aortic valve  Allergies  Allergen Reactions   Pantoprazole Sodium Nausea Only    Gets gassy and starting itching like crazy Gets gassy and starting itching like crazy   Valium [Diazepam] Other (See Comments)    HA and Abd pain.   Tape Rash and Other (See Comments)    Surgical tape   Wound Dressing Adhesive Other (See Comments) and Rash    Surgical tape Surgical tape Surgical tape    Patient Measurements: Height: _0  (175.3 cm) Weight: 61.4 kg (135 lb 4.8 oz) IBW/kg (Calculated) : 70.7 Heparin Dosing Weight: 6  Vital Signs: Temp: 97.5 F (36.4 C) (10/09 0500) Temp Source: Oral (10/09 0500) BP: 107/67 (10/09 0500) Pulse Rate: 64 (10/09 0500)  Labs: Recent Labs    09/30/21 1254 09/30/21 1254 09/30/21 2331 10/01/21 0755 10/01/21 1806 10/02/21 0137 10/02/21 0722  HGB 11.3*  --  11.9*  --   --  10.6*  --   HCT 35.4*  --  37.0*  --   --  32.9*  --   PLT 192  --  226  --   --  217  --   LABPROT 18.4*  --  17.9* 19.3*  --  22.9*  --   INR 1.5*  --  1.5* 1.6*  --  2.0*  --   HEPARINUNFRC  --    < > <0.10* 0.30 0.21*  --  0.57  CREATININE 1.41*  --  1.65*  --   --  1.48*  --    < > = values in this interval not displayed.    Estimated Creatinine Clearance: 38 mL/min (A) (by C-G formula based on SCr of 1.48 mg/dL (H)).  Assessment:  75 year old male with history of afib and Mechanical aortic valve replacement on chronic coumadin. Patient admitted post [place]. PTA warfarin schedule is 2.5 mg Sun, Mon, Tue, Thu, Fri and 5 mg on Wed and Sat. INR low at 1.7 on admit.   Patient was started on a heparin-warfarin bridge on 10/7. Today's heparin level is therapeutic (HL=0.57) _1  units/hr. INR has increased to 2.0, but remains below goal of 2.5-3.  Per conversation with RN, no s/sx of bleeding or issues with infusion. CBC remains stable.  Goal of  Therapy:  Heparin level 0.3-0.7 units/ml INR goal 2.5-3 Monitor platelets by anticoagulation protocol: Yes   Plan:  Continue heparin gtt _2  units/hr Warfarin 5 mg x1 Check anti-Xa level in 8 hours and daily while on heparin Continue to monitor H&H and platelets  Pauletta Browns, Pharm.D. PGY-1 Pharmacy Resident [phone] 10/02/2021 8:38 AM

## 2021-10-02 NOTE — Progress Notes (Signed)
Upon review of today's strips, it appeared that patient had a ventricular run of approximately 20 beats, it was slow and the time was approximately 1330 this afternoon. I was not called about it. Patients K 3.2 and Dr. Haroldine Laws saw patient and wrote for supplements. I saw his note where he noted the frequency of PVC but I was unaware if he saw this run. I paged MD on call through answering service to make aware. Awaiting return of call. Patient has not had any complaints today.

## 2021-10-02 NOTE — Progress Notes (Signed)
Advanced Heart Failure Rounding Note  PCP-Cardiologist: None   Subjective:    Off IV lasix yesterday. Weight up 3 pounds.   SCr 1.4 -> 1.65 -> 1.4. Remains on IV heparin for AVR  INR 1.6 -> 2.0  Objective:   Weight Range: 61.4 kg Body mass index is 19.98 kg/m.   Vital Signs:   Temp:  [97.5 F (36.4 C)-98 F (36.7 C)] 97.5 F (36.4 C) (10/09 0500) Pulse Rate:  [63-67] 64 (10/09 0500) Resp:  [18] 18 (10/09 0500) BP: (100-123)/(61-70) 123/68 (10/09 1200) SpO2:  [97 %-99 %] 97 % (10/09 0500) Weight:  [61.4 kg] 61.4 kg (10/09 0500) Last BM Date: 10/02/21  Weight change: Filed Weights   09/30/21 0456 10/01/21 0535 10/02/21 0500  Weight: 62 kg 60.1 kg 61.4 kg    Intake/Output:   Intake/Output Summary (Last 24 hours) at 10/02/2021 1351 Last data filed at 10/02/2021 0500 Gross per 24 hour  Intake 718.18 ml  Output 700 ml  Net 18.18 ml       Physical Exam    General:  Sitting in chair  No resp difficulty HEENT: normal Neck: supple. JVP 9-10 with prominent v-waves Carotids 2+ bilat; + bruits. No lymphadenopathy or thryomegaly appreciated. Cor: PMI nondisplaced. Irregular rate & rhythm. 2/6 SEM Lungs: clear Abdomen: soft, nontender, nondistended. No hepatosplenomegaly. No bruits or masses. Good bowel sounds. Extremities: no cyanosis, clubbing, rash, tr edema. Severe varicose veins and multiple ecchymoses Neuro: alert & orientedx3, cranial nerves grossly intact. moves all 4 extremities w/o difficulty. Affect pleasant  Telemetry   Atrial fibrillation 50-60s Personally reviewed   Labs    CBC Recent Labs    09/30/21 2331 10/02/21 0137  WBC 5.2 4.9  HGB 11.9* 10.6*  HCT 37.0* 32.9*  MCV 78.7* 78.0*  PLT 226 702    Basic Metabolic Panel Recent Labs    09/30/21 1254 09/30/21 2331 10/02/21 0137  NA 126* 129* 125*  K 3.1* 4.2 3.2*  CL 90* 92* 91*  CO2 _0 GLUCOSE 104* 117* 96  BUN 24* 26* 30*  CREATININE 1.41* 1.65* 1.48*  CALCIUM 9.1 9.9  9.1  MG 2.0 2.2  --     Liver Function Tests Recent Labs    09/29/21 1440  AST 36  ALT 17  ALKPHOS 60  BILITOT 2.2*  PROT 7.6  ALBUMIN 3.5    No results for input(s): LIPASE, AMYLASE in the last 72 hours. Cardiac Enzymes No results for input(s): CKTOTAL, CKMB, CKMBINDEX, TROPONINI in the last 72 hours.  BNP: BNP (last 3 results) Recent Labs    09/12/21 1238 09/27/21 1546 09/30/21 0241  BNP 1,141.4* 953.2* 840.7*     ProBNP (last 3 results) Recent Labs    07/04/21 1607 08/16/21 1620  PROBNP 425.0* 601.0*      D-Dimer No results for input(s): DDIMER in the last 72 hours. Hemoglobin A1C No results for input(s): HGBA1C in the last 72 hours. Fasting Lipid Panel No results for input(s): CHOL, HDL, LDLCALC, TRIG, CHOLHDL, LDLDIRECT in the last 72 hours. Thyroid Function Tests No results for input(s): TSH, T4TOTAL, T3FREE, THYROIDAB in the last 72 hours.  Invalid input(s): FREET3  Other results:   Imaging    No results found.   Medications:     Scheduled Medications:  aspirin EC  81 mg Oral q AM   famotidine  20 mg Oral QHS   macitentan  10 mg Oral Daily   midodrine  15 mg Oral TID WC  potassium chloride  40 mEq Oral Q4H   rosuvastatin  5 mg Oral QPM   Selexipag  200 mcg Oral QPM   Selexipag  400 mcg Oral q AM   sodium chloride flush  3 mL Intravenous Q12H   spironolactone  25 mg Oral Daily   tadalafil  40 mg Oral q AM   tolvaptan  30 mg Oral Once   warfarin  5 mg Oral ONCE-1600   Warfarin - Pharmacist Dosing Inpatient   Does not apply q1600    Infusions:  sodium chloride      PRN Medications: sodium chloride, acetaminophen, albuterol, loratadine, ondansetron (ZOFRAN) IV, senna-docusate, sodium chloride flush     Assessment/Plan   1. Acute on chronic diastolic HF - not responding well to outpatient management.  - admitted 10/06 for IV diuresis.  - IV lasix stopped yesterday due to bump in Scr. Weight trending up. Restart po  torsemide - Consider SGLT2i - Continue spiro 25 mg daily   2. Pulmonary hypertension with cor pulmonale/RV failure - WHO Group I & II - Auto-immune serologies negative (checked twice). - ? Component of HHT/shunt/AVMs with late bubbles on bubble study.  - Echo with bubble study (05/16/18): EF 55-60% with "late bubbles" , Mechanical AVR stable with trivial perivalvular regurg, Mild MR, Severe LAE, Severe RV dilation and reduced function. No PFO. Mod TR, PA peak pressure 68 mm Hg - Echo (4/22): EF 50-55%, RV ok, severe RVSP 61.6 mmHg, severe biatrial enlargement, moderate TR, stable AVR, AV mean gradient 11 mmHg - Echo 10/22 EF 45-50 RV low normal  - RHC data as above - Ab u/s with cirrhosis but no evidence of portal HTN. - On triple therapy with Macitentan and Adcirca. Uptravi dose at 400/200 limited by cramping.  Numbers not c/w need for IV therapies   3. Chronic respiratory failure - [place] CT 05/16/18 with "dependent basilar predominant patchy subpleural reticulation and ground-glass attenuation with the suggestion of minimal associated traction bronchiolectasis. No frank honeycombing. Findings may indicate an interstitial lung disease such as early usual interstitial pneumonia (UIP) or fibrotic phase nonspecific interstitial pneumonia (NSIP)." - Followed by Dr. Lamonte Sakai. - Stable   4. Chronic AFL/pauses - Rate controlled, rate 50s-60s.  Now off metoprolol.  - Multiple pauses 2-3sc overnight on 10/6. B-blocker stopped - Continue coumadin with mechanical AVR.   5. Frequent PVCs - this may be contributing to HF - repeat echo stable  - Up to 10 PVCs/min on telemetry    6 CAD - s/p CABG 07/2017 with Dr Roderic Palau in [place].  - No s/s ischemia - Continue Crestor 5 mg daily (unable to tolerate atorva due to myalgias).  - Lipids followed by Dr. Bettina Gavia.   7. S/p mechanical AVR - Stable on echo 4/22. - On heparin. INR 2.0.  Stop heparin given significant bleeding risk    8. Leukocytoclastic  vasculitis - F/u with Rheumatology.  - No changes.   9. Cirrhosis - US Abdomen RUQ 05/16/18 - s/p cholecystectomy. + hepatic cirrhosis. Mild ascites.  - Liver Doppler 05/16/18 - No hepatic, splenic, or portal venous thrombosis or occlusion. Mild ascites. - Was taking midodrine 15 bid at home. Now on 15 tid here due to low BP   10. Hyponatremia -Improved with tolvaptan but now back down to 124.  - will give another dose of tolvaptan today - continue free water restrict  11. Hypokalemia - K 3.3 today - will supp Discussed with PharmD personally.  Length of Stay: 3  Glori Bickers, MD  10/02/2021, 1:51 PM  Advanced Heart Failure Team Pager [phone] (M-F; 7a - 5p)  Please contact [facility] for night-coverage after hours (5p -7a ) and weekends on [email or web address]

## 2021-10-02 NOTE — Progress Notes (Signed)
Patient already received drug information sheet from [facility]. It is at his bedside

## 2021-10-03 ENCOUNTER — Ambulatory Visit: Payer: Medicare Other | Admitting: Medical

## 2021-10-03 DIAGNOSIS — I5033 Acute on chronic diastolic (congestive) heart failure: Secondary | ICD-10-CM | POA: Diagnosis not present

## 2021-10-03 LAB — BASIC METABOLIC PANEL
Anion gap: 10 (ref 5–15)
Anion gap: 11 (ref 5–15)
BUN: 36 mg/dL — ABNORMAL HIGH (ref 8–23)
BUN: 36 mg/dL — ABNORMAL HIGH (ref 8–23)
CO2: 26 mmol/L (ref 22–32)
CO2: 27 mmol/L (ref 22–32)
Calcium: 9.8 mg/dL (ref 8.9–10.3)
Calcium: 9.9 mg/dL (ref 8.9–10.3)
Chloride: 93 mmol/L — ABNORMAL LOW (ref 98–111)
Chloride: 97 mmol/L — ABNORMAL LOW (ref 98–111)
Creatinine, Ser: 1.75 mg/dL — ABNORMAL HIGH (ref 0.61–1.24)
Creatinine, Ser: 1.85 mg/dL — ABNORMAL HIGH (ref 0.61–1.24)
GFR, Estimated: 38 mL/min — ABNORMAL LOW (ref >60)
GFR, Estimated: 40 mL/min — ABNORMAL LOW (ref >60)
Glucose, Bld: 116 mg/dL — ABNORMAL HIGH (ref 70–99)
Glucose, Bld: 94 mg/dL (ref 70–99)
Potassium: 4.2 mmol/L (ref 3.5–5.1)
Potassium: 4.6 mmol/L (ref 3.5–5.1)
Sodium: 130 mmol/L — ABNORMAL LOW (ref 135–145)
Sodium: 134 mmol/L — ABNORMAL LOW (ref 135–145)

## 2021-10-03 LAB — CBC
HCT: 36.2 % — ABNORMAL LOW (ref 39.0–52.0)
Hemoglobin: 11.3 g/dL — ABNORMAL LOW (ref 13.0–17.0)
MCH: 24.8 pg — ABNORMAL LOW (ref 26.0–34.0)
MCHC: 31.2 g/dL (ref 30.0–36.0)
MCV: 79.6 fL — ABNORMAL LOW (ref 80.0–100.0)
Platelets: 217 10*3/uL (ref 150–400)
RBC: 4.55 MIL/uL (ref 4.22–5.81)
RDW: 22.6 % — ABNORMAL HIGH (ref 11.5–15.5)
WBC: 4.3 10*3/uL (ref 4.0–10.5)
nRBC: 0 % (ref 0.0–0.2)

## 2021-10-03 LAB — PROTIME-INR
INR: 2.1 — ABNORMAL HIGH (ref 0.8–1.2)
Prothrombin Time: 23.3 seconds — ABNORMAL HIGH (ref 11.4–15.2)

## 2021-10-03 LAB — MAGNESIUM: Magnesium: 2 mg/dL (ref 1.7–2.4)

## 2021-10-03 MED ORDER — SPIRONOLACTONE 25 MG PO TABS
25.0000 mg | ORAL_TABLET | Freq: Every day | ORAL | Status: DC
  Administered 2021-10-04: 25 mg via ORAL
  Filled 2021-10-03: qty 1

## 2021-10-03 MED ORDER — ADULT MULTIVITAMIN W/MINERALS CH
1.0000 | ORAL_TABLET | Freq: Every day | ORAL | Status: DC
  Administered 2021-10-03 – 2021-10-04 (×2): 1 via ORAL
  Filled 2021-10-03 (×2): qty 1

## 2021-10-03 MED ORDER — OXYCODONE HCL 5 MG PO TABS
5.0000 mg | ORAL_TABLET | Freq: Once | ORAL | Status: AC
  Administered 2021-10-03: 5 mg via ORAL
  Filled 2021-10-03 (×2): qty 1

## 2021-10-03 MED ORDER — WARFARIN SODIUM 5 MG PO TABS
5.0000 mg | ORAL_TABLET | Freq: Once | ORAL | Status: AC
  Administered 2021-10-03: 5 mg via ORAL
  Filled 2021-10-03: qty 1

## 2021-10-03 MED ORDER — SPIRONOLACTONE 12.5 MG HALF TABLET
12.5000 mg | ORAL_TABLET | Freq: Once | ORAL | Status: AC
  Administered 2021-10-03: 12.5 mg via ORAL
  Filled 2021-10-03: qty 1

## 2021-10-03 NOTE — Progress Notes (Signed)
ANTICOAGULATION CONSULT NOTE - Follow Up Consult  Pharmacy Consult for Warfarin  Indication: atrial fibrillation and mechanical aortic valve  Allergies  Allergen Reactions   Pantoprazole Sodium Nausea Only    Gets gassy and starting itching like crazy Gets gassy and starting itching like crazy   Valium [Diazepam] Other (See Comments)    HA and Abd pain.   Tape Rash and Other (See Comments)    Surgical tape   Wound Dressing Adhesive Other (See Comments) and Rash    Surgical tape Surgical tape Surgical tape    Patient Measurements: Height: _0  (175.3 cm) Weight: 60.5 kg (133 lb 6.4 oz) IBW/kg (Calculated) : 70.7 Heparin Dosing Weight: 6  Vital Signs: Temp: 97.4 F (36.3 C) (10/10 0453) Temp Source: Oral (10/10 0453) BP: 123/78 (10/10 0453) Pulse Rate: 70 (10/10 0453)  Labs: Recent Labs    09/30/21 2331 10/01/21 0755 10/01/21 1806 10/02/21 0137 10/02/21 0722 10/02/21 2349 10/03/21 0541  HGB 11.9*  --   --  10.6*  --   --  11.3*  HCT 37.0*  --   --  32.9*  --   --  36.2*  PLT 226  --   --  217  --   --  217  LABPROT 17.9* 19.3*  --  22.9*  --   --  23.3*  INR 1.5* 1.6*  --  2.0*  --   --  2.1*  HEPARINUNFRC <0.10* 0.30 0.21*  --  0.57  --   --   CREATININE 1.65*  --   --  1.48*  --  1.85* 1.75*     Estimated Creatinine Clearance: 31.7 mL/min (A) (by C-G formula based on SCr of 1.75 mg/dL (H)).  Assessment:  75 year old male with history of afib and Mechanical aortic valve replacement on chronic coumadin. Patient admitted post [facility]. PTA warfarin schedule is 2.5 mg Sun, Mon, Tue, Thu, Fri and 5 mg on Wed and Sat. INR low at 1.7 on admit.   INR up to 2.1 this morning, heparin stopped 10/9. Noted patient bleeding from arm under bandage on HF rounds. CBC stable.   With INR still below goal this morning will give extra today then resume home regimen 10/11.  Goal of Therapy:  INR goal 2.5-3 Monitor platelets by anticoagulation protocol: Yes   Plan:  Give 50m  again today 10/10 then resume prior home warfarin regimen 10/11 Warfarin 2.566mdaily except 47m17mn Wednesday and Saturday. Daily INR while still here  FraErin HearingarmD., BCPS Clinical Pharmacist 10/03/2021 11:03 AM

## 2021-10-03 NOTE — Discharge Summary (Addendum)
Advanced Heart Failure Team  Discharge Summary   Patient ID: Lance Hicks MRN: [id number], DOB/AGE: 74-Mar-1947 74 y.o. Admit date: 09/29/2021 D/C date:     10/04/2021   Primary Discharge Diagnoses:  1. Acute on chronic diastolic HF 2. Pulmonary hypertension with cor pulmonale/RV failure 3. Chronic hypoxic respiratory failure 4. Chronic AFL/pauses 5. Frequent PVCs 6 CAD 7. S/p mechanical AVR 8. Leukocytoclastic vasculitis 9. Cirrhosis 10. Hyponatremia 11. 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 Also has COPD (quit smoking in 2002 - 1 ppd x 25 years), HL, and HTN.   Seen at [facility] in 2018 had hi-res CT, PFTs and VQ (low prob). Felt to have Golds I COPD with asthma overlap and early pulmonary fibrosis with PAH. Echo 8/18  normal LV function with septal flattening and RVSP 65-4mHG. 14-day event monitor showed 10-beat run NSVT. Felt to have chronic AF and PAH.    Underwent RHC on 01/18/18 with severe PAH and cor pulmonale. Macitentan and Adcirca. Subsequently started on selexipag but dose limited by cramping now on 400/200   Admitted 5/19 with symptomatic hypotension and weight gain. He had a repeat RHC and echo with bubble study (results below). He was started on midodrine for BP support. Diuresed 25 lbs with IV lasix, then transitioned to torsemide 60 mg daily. Abdominal UKoreashowed cirrhosis with no evidence of portal HTN. Pulm consulted for possible ILD on High-res chest CT.   Acute visit 08/02/21 feeling poorly, NYHA III symptoms, volume ok. Suspected worsening anxiety driving symptoms.     Seen 09/12/21 with NYHA IIIb-IV symptoms. ReDs 38%. Given 80 m g IV lasix + metolazone 2.5 in clinic. Then arranged for 80 mg IV lasix x 2 days with [facility]. He was seen during his lab appt  and he was some better but still had LE edema so we did one more dose of 80 mg IV lasix with [facility], last dose given 09/21/21.  Admitted from the cath lab on 10/6 with elevated filling pressures. RHC with moderate PAH and elevated filling pressures. Started on IV lasix then transitioned to torsemide. Torsemide held due to creatinine bump. He will restart torsemide and potassium 10/06/21.   PAH medications continued. INR followed closely by HF Pharmacy with adjustments. Tomorrow he take 2.5 mg coumadin until INR checked next week. He will continue to be followed closely in the HF clinic.     1. Acute on chronic diastolic HF - not responding well to outpatient management. RHC with elevated filling pressures.  - admitted 10/06 for IV diuresis. Transitioned to torsemide on 10/02/21.  -  Creatinine bumped on the day of discharged so torsemide  will be held 10/11 and 10/12.   - Consider SGLT2i at f/u if scr remains stable. - Continue spiro at 25 daily   2. Pulmonary hypertension with cor pulmonale/RV failure - WHO Group I & II - Auto-immune serologies negative (checked twice). - ? Component of HHT/shunt/AVMs with late bubbles on bubble study.  - Echo with bubble study (05/16/18): EF 55-60% with "late bubbles" , Mechanical AVR stable with trivial perivalvular regurg, Mild MR, Severe LAE, Severe RV dilation and reduced function. No PFO. Mod TR, PA peak pressure 68 mm Hg - Echo (4/22): EF 50-55%, RV ok, severe RVSP 61.6 mmHg, severe biatrial enlargement, moderate  TR, stable AVR, AV mean gradient 11 mmHg - Echo 10/22 EF 45-50 RV low normal  - RHC this admit PVR 3.1  - Ab u/s with cirrhosis but no evidence of portal HTN. - On triple therapy with Macitentan and Adcirca. Uptravi dose at 400/200 limited by cramping.  Numbers not c/w need for IV therapies   3. Chronic hypoxic respiratory failure - [place] CT 05/16/18 with "dependent basilar predominant patchy subpleural reticulation and ground-glass  attenuation with the suggestion of minimal associated traction bronchiolectasis. No frank honeycombing. Findings may indicate an interstitial lung disease such as early usual interstitial pneumonia (UIP) or fibrotic phase nonspecific interstitial pneumonia (NSIP)." - Followed by Dr. Lamonte Sakai. - Stable on room air.    4. Chronic AFL/pauses - Rate controlled, rate 50s-60s.  Now off metoprolol.  - Multiple pauses 2-3sc on 10/6. No further pauses noted. Keep off beta blocker.  - Continue coumadin with mechanical AVR.   5. Frequent PVCs - this may be contributing to HF - repeat echo stable  -Rare PVCs    6 CAD - s/p CABG 07/2017 with Dr Roderic Palau in [place].  - No s/s ischemia - Continue Crestor 5 mg daily (unable to tolerate atorva due to myalgias).  - Lipids followed by Dr. Bettina Gavia.   7. S/p mechanical AVR - Stable on echo 4/22. - Off heparin.  - On coumadin. INR 2.7.     8. Leukocytoclastic vasculitis - F/u with Rheumatology.  - No changes.   9. Cirrhosis - US Abdomen RUQ 05/16/18 - s/p cholecystectomy. + hepatic cirrhosis. Mild ascites.  - Liver Doppler 05/16/18 - No hepatic, splenic, or portal venous thrombosis or occlusion. Mild ascites. - Continue midodrine.   10. Hyponatremia -Improved to 134 after 30 mg tolvaptan on 10/9  - Todays sodium 127.  - continue free water restrict   11. Hypokalemia - Resolved. K supplemented as needed.   Discharge Weight: 134 pounds.   Discharge Vitals: Blood pressure 110/65, pulse 68, temperature 98.2 F (36.8 C), resp. rate 16, height _0  (1.753 m), weight 61 kg, SpO2 97 %.  Labs: Lab Results  Component Value Date   WBC 4.4 10/04/2021   HGB 10.2 (L) 10/04/2021   HCT 32.9 (L) 10/04/2021   MCV 79.3 (L) 10/04/2021   PLT 204 10/04/2021    Recent Labs  Lab 09/29/21 1440 09/30/21 0241 10/04/21 0101  NA  --    < > 127*  K  --    < > 3.4*  CL  --    < > 94*  CO2  --    < > 22  BUN  --    < > 41*  CREATININE  --    < > 2.05*  CALCIUM  --     < > 9.3  PROT 7.6  --   --   BILITOT 2.2*  --   --   ALKPHOS 60  --   --   ALT 17  --   --   AST 36  --   --   GLUCOSE  --    < > 118*   < > = values in this interval not displayed.   Lab Results  Component Value Date   CHOL 133 10/12/2020   HDL 44 10/12/2020   LDLCALC 70 10/12/2020   TRIG 111 10/12/2020   BNP (last 3 results) Recent Labs    09/12/21 1238 09/27/21 1546 09/30/21 0241  BNP 1,141.4* 953.2* 840.7*    ProBNP (last 3  results) Recent Labs    07/04/21 1607 08/16/21 1620  PROBNP 425.0* 601.0*     Diagnostic Studies/Procedures   RHC 09/29/21  RA = 12 RV = 71/9 PA = 79/20 (38) PCW = 21 (v = 30) Fick cardiac output/index = 7.0/3.14 PVR = 3.1 WU FA sat = 99% PA sat = 70%, 74% PAPi = 4.9   Assessment: 1. Moderate mixed PAH with mild to moderate volume overload   Discharge Medications   Allergies as of 10/04/2021       Reactions   Pantoprazole Sodium Nausea Only   Gets gassy and starting itching like crazy Gets gassy and starting itching like crazy   Valium [diazepam] Other (See Comments)   HA and Abd pain.   Tape Rash, Other (See Comments)   Surgical tape   Wound Dressing Adhesive Other (See Comments), Rash   Surgical tape Surgical tape Surgical tape        Medication List     STOP taking these medications    Digestive Advantage Caps   ondansetron 4 MG disintegrating tablet Commonly known as: Zofran ODT   SENOKOT LAXATIVE GUMMIES PO       TAKE these medications    acetaminophen 500 MG tablet Commonly known as: TYLENOL Take 500-1,000 mg by mouth every 6 (six) hours as needed (pain.).   albuterol 108 (90 Base) MCG/ACT inhaler Commonly known as: VENTOLIN HFA Inhale 2 puffs into the lungs every 6 (six) hours as needed for wheezing or shortness of breath.   ammonium lactate 12 % lotion Commonly known as: LAC-HYDRIN Apply 1 application topically as needed for dry skin.   aspirin EC 81 MG tablet Take 81 mg by mouth in  the morning.   busPIRone 7.5 MG tablet Commonly known as: BUSPAR Take 1 tablet (7.5 mg total) by mouth 2 (two) times daily.   famotidine-calcium carbonate-magnesium hydroxide 10-800-165 MG chewable tablet Commonly known as: PEPCID COMPLETE Chew 2 tablets by mouth daily as needed (indigestion/heartburn).   Ferrous Sulfate 50 MG Tbcr Take 1 tablet by mouth every 3 (three) days. Nature's Way Ultimate Iron Maximum Absorption Synergistic Blend   HEMP OIL-VANILLYL BUTYL ETHER EX Apply 1 application topically as needed (dry skin). Hemp Oil Lotion   KRILL OIL PO Take 800 mg by mouth every other day. In the morning   Magnesium 200 MG Chew Chew 400 mg by mouth daily at 12 noon.   metolazone 2.5 MG tablet Commonly known as: ZAROXOLYN TAKE 1 TABLET(2.5 MG) BY MOUTH DAILY AS NEEDED FOR FLUID RETENTION   midodrine 5 MG tablet Commonly known as: PROAMATINE Take 3 tablets (15 mg total) by mouth 3 (three) times daily with meals. What changed:  medication strength how much to take   Opsumit 10 MG tablet Generic drug: macitentan TAKE 1 TABLET DAILY   potassium chloride SA 20 MEQ tablet Commonly known as: KLOR-CON Take 2 tablets (40 mEq total) by mouth 2 (two) times daily. Start taking on: October 06, 2021 What changed:  See the new instructions. These instructions start on October 06, 2021. If you are unsure what to do until then, ask your doctor or other care provider.   QUNOL ULTRA COQ10 PO Take 1 capsule by mouth in the morning.   rosuvastatin 5 MG tablet Commonly known as: CRESTOR TAKE 1 TABLET(5 MG) BY MOUTH DAILY What changed:  how much to take how to take this when to take this additional instructions   spironolactone 25 MG tablet Commonly known as: ALDACTONE Take  1 tablet (25 mg total) by mouth daily. What changed:  how much to take how to take this when to take this additional instructions   STRESS/EXHAUSTION RELIEF SL Take 2 tablets by mouth every other day.  Nature Made Stress Relief Gummies Mixed Berry   tadalafil ([place]) 20 MG tablet Commonly known as: ADCIRCA Take 40 mg by mouth in the morning.   TART CHERRY PO Take 2 tablets by mouth daily at 12 noon. HumanN Tart Cherry Gummy by [employer]   torsemide 20 MG tablet Commonly known as: Demadex Take 2 tablets (40 mg total) by mouth 2 (two) times daily. Start taking on: October 06, 2021 What changed:  how much to take when to take this These instructions start on October 06, 2021. If you are unsure what to do until then, ask your doctor or other care provider.   Turmeric 500 MG Tabs Take 1 capsule by mouth daily in the afternoon. With Ginger 50 mg   Uptravi 400 MCG Tabs Generic drug: Selexipag Take 400 mcg by mouth in the morning.   Uptravi 200 MCG Tabs Generic drug: Selexipag Take 200 mcg by mouth every evening.   vitamin B-12 1000 MCG tablet Commonly known as: CYANOCOBALAMIN Take 1,000 mcg by mouth daily at 12 noon.   VITAMIN C GUMMIE PO Take 2 tablets by mouth daily at 12 noon.   Vitamin D3 50 MCG (2000 UT) Tabs Take 2,000 Units by mouth daily at 12 noon.   warfarin 5 MG tablet Commonly known as: COUMADIN Take as directed. If you are unsure how to take this medication, talk to your nurse or doctor. Original instructions: TAKE 1/2 DAILY DAILY AS DIRECTED BY COUMADIN CLINIC Start taking on: October 05, 2021 What changed: additional instructions   Zinc 50 MG Tabs Take 50 mg by mouth every other day. In the afternoon        Disposition   The patient will be discharged in stable condition to home. Discharge Instructions     (HEART FAILURE PATIENTS) Call MD:  Anytime you have any of the following symptoms: 1) 3 pound weight gain in 24 hours or 5 pounds in 1 week 2) shortness of breath, with or without a dry hacking cough 3) swelling in the hands, feet or stomach 4) if you have to sleep on extra pillows at night in order to breathe.   Complete by: As directed    Diet  - low sodium heart healthy   Complete by: As directed    Discharge patient   Complete by: As directed    Discharge disposition: 01-Home or Self Care   Discharge patient date: 10/03/2021   Heart Failure patients record your daily weight using the same scale at the same time of day   Complete by: As directed    Increase activity slowly   Complete by: As directed        Follow-up Information     [facility] Follow up on 10/13/2021.   Specialty: Cardiology Why: Advanced Heart Failure Clinic at [facility] at 3 pm Entrance C, Garage [address] Contact information: [address] [id number] [place] [phone]                  Duration of Discharge Encounter: Greater than 35 minutes   Signed, Amy Clegg NP-C  10/04/2021, 2:03 PM   Agree with above. Stable with d/c today. See today's rounding note for further details.  Glori Bickers, MD  4:44 PM

## 2021-10-03 NOTE — Progress Notes (Signed)
Pt repeat BMP with following results K 4.2, Mg 2.0, Na 130.  Ectopy improved on telemetry.  Pt c/o 9/10 bilateral foot and ankle burning after ambulating in hallways.  He states occasionally happens at home but never as severe or prolonged ~ 40 minutes.  Pt requesting pain medication.  Pt had tylenol earlier for generalized aches with relief. Updated Dr. Renella Cunas with lab results and new order for one time pain medication received. Pt resting with eyes closed.  Will continue to monitor closely.

## 2021-10-03 NOTE — TOC Initial Note (Signed)
Transition of Care [facility]) - Initial/Assessment Note    Patient Details  Name: Lance Hicks MRN: [id number] Date of Birth: July 13, 1946  [facility]) CM/SW Contact:    Erenest Rasher, RN Phone Number: [phone] 10/03/2021, 5:43 PM  Clinical Narrative:                 HF TOC CM spoke to pt at bedside. States he is independent at home and lives alone. Drives to his medical appts. His church family is supportive. Able to afford meds and on patient assistance program for Selexipag, Cialis, and Opsumit.   Expected Discharge Plan: Home/Self Care Barriers to Discharge: Continued Medical Work up   Patient Goals and CMS Choice        Expected Discharge Plan and Services Expected Discharge Plan: Home/Self Care In-house Referral: Clinical Social Work Discharge Planning Services: CM Consult   Living arrangements for the past 2 months: Single Family Home  Prior Living Arrangements/Services Living arrangements for the past 2 months: Single Family Home Lives with:: Self Patient language and need for interpreter reviewed:: Yes Do you feel safe going back to the place where you live?: Yes      Need for Family Participation in Patient Care: No (Comment) Care giver support system in place?: No (comment)   Criminal Activity/Legal Involvement Pertinent to Current Situation/Hospitalization: No - Comment as needed  Activities of Daily Living Home Assistive Devices/Equipment: Eyeglasses ADL Screening (condition at time of admission) Patient's cognitive ability adequate to safely complete daily activities?: Yes Is the patient deaf or have difficulty hearing?: No Does the patient have difficulty seeing, even when wearing glasses/contacts?: No Does the patient have difficulty concentrating, remembering, or making decisions?: No Patient able to express need for assistance with ADLs?: Yes Does the patient have difficulty dressing or bathing?: No Independently performs ADLs?:  Yes (appropriate for developmental age) Does the patient have difficulty walking or climbing stairs?: Yes Weakness of Legs: Both Weakness of Arms/Hands: None  Permission Sought/Granted Permission sought to share information with : Case Manager, Family Supports, PCP   Emotional Assessment Appearance:: Appears stated age Attitude/Demeanor/Rapport: Engaged Affect (typically observed): Accepting Orientation: : Oriented to Self, Oriented to Place, Oriented to  Time, Oriented to Situation   Psych Involvement: No (comment)  Admission diagnosis:  Acute on chronic diastolic (congestive) heart failure (HCC) [I50.33] Patient Active Problem List   Diagnosis Date Noted   Acute on chronic diastolic (congestive) heart failure ([place]) 09/29/2021   ILD (interstitial lung disease) ([place]) 08/04/2021   Fall 06/21/2021   History of COVID-19 04/05/2021   Asthma    Pneumonia due to COVID-19 virus 02/07/2021   Chronic respiratory failure with hypoxia ([place]) 07/29/2019   Cirrhosis of liver not due to alcohol ([place]) 07/01/2019   Anemia 07/01/2019   Hypotension, chronic 06/06/2018   Enterococcal bacteremia    Pulmonary edema    Strain of deltoid muscle, initial encounter    Pulmonary arterial hypertension ([place]) 02/20/2018   Hypokalemia due to excessive renal loss of potassium 02/18/2018   Left ureteral stone 01/23/2018   Anticoagulated on Coumadin 01/04/2018   Localized edema 01/04/2018   Leukocytoclastic vasculitis ([place]) 10/01/2017   Palpitations 10/01/2017   Hypersensitivity angiitis ([place]) 10/01/2017   Maculopapular rash 09/03/2017   Epidermoid cyst of skin 08/24/2017   Bilateral pleural effusion 08/09/2017   Pleural effusion, bilateral 08/09/2017   Supratherapeutic INR 07/26/2017   International normalized ratio (INR) raised 07/26/2017   Microscopic hematuria 06/20/2017   Prostate cancer  screening 06/20/2017   Asymptomatic microscopic hematuria 06/20/2017   Atrial flutter ([place]) 03/29/2017    Chronic anticoagulation 03/29/2017   Coronary artery disease involving native coronary artery of native heart with angina pectoris ([place]) 03/29/2017   H/O maze procedure 03/29/2017   History of coronary artery bypass graft 03/29/2017   Hypertensive heart disease with heart failure ([place]) 03/29/2017   Nonsustained ventricular tachycardia 03/29/2017   Hyperlipidemia 03/29/2017   Syncope 03/29/2017   Coronary arteriosclerosis in native artery 03/29/2017   Hypertensive heart failure ([place]) 03/29/2017   Long term (current) use of anticoagulants 03/29/2017   Paroxysmal atrial fibrillation ([place]) 03/29/2017   Other hyperlipidemia 03/29/2017   Non-sustained ventricular tachycardia 03/29/2017   Hypertensive heart disease 03/29/2017   Hx of CABG 03/29/2017   Atrial fibrillation ([place]) 03/29/2017   Cough 10/17/2016   Chronic obstructive lung disease ([place]) 06/12/2016   Pulmonary nodules 06/12/2016   Multiple nodules of lung 06/12/2016   COPD (chronic obstructive pulmonary disease) ([place]) 06/12/2016   Anxiety 05/10/2016   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   Allergic rhinitis 04/28/2016   [name] 03/20/2016   S/P AVR 03/20/2016   S/P AVR (aortic valve replacement) 03/20/2016   Nasal discharge 02/25/2016   Post-nasal drainage 02/25/2016   Dyspnea 02/01/2016   Syncope and collapse 02/01/2016   Typical atrial flutter ([place]) 02/01/2016   Lumbar radicular pain 01/19/2016   Lumbar radiculopathy 01/19/2016   Backache 12/14/2015   Essential hypertension 12/14/2015   Chronic midline back pain 12/14/2015   Chronic prostatitis 07/23/2015   Nephrolithiasis 07/23/2015   Kidney stone 07/23/2015   Chronic atrial fibrillation ([place]) 07/23/2015   Kidney stones 07/23/2015   PCP:  [name], PA-C Pharmacy:   [facility] [place], [address] AT [place] [address] [place] [address] Phone: [phone]  Fax: [phone]  [place], [place] [address] Phone: [phone] Fax: [phone]  [facility] [place], [place] [address] [place] [address] Phone: [phone] Fax: [phone]  [place] [address] [place] [address] Phone: [phone] Fax: [phone]     Social Determinants of Health (SDOH) Interventions Food Insecurity Interventions: Intervention Not Indicated Financial Strain Interventions: Intervention Not Indicated Housing Interventions: Intervention Not Indicated Transportation Interventions: Intervention Not Indicated  Readmission Risk Interventions No flowsheet data found.

## 2021-10-03 NOTE — Progress Notes (Addendum)
Initial Nutrition Assessment  DOCUMENTATION CODES:   Severe malnutrition in context of chronic illness  INTERVENTION:   Multivitamin w/ minerals daily  Double protein on all meal trays   Recommend 2-gram Sodium diet due to the need for additional calories   Low Sodium and Heart Failure Nutrition Therapy for Undernourished handouts and diet education provided to pt   NUTRITION DIAGNOSIS:   Severe Malnutrition related to chronic illness (CHF, COPD) as evidenced by severe muscle depletion, severe fat depletion, energy intake < or equal to 75% for > or equal to 1 month.  GOAL:   Patient will meet greater than or equal to 90% of their needs  MONITOR:   PO intake, Weight trends, Labs, I & O's  REASON FOR ASSESSMENT:   Consult Assessment of nutrition requirement/status  ASSESSMENT:   75 y.o. male admitted for diuresis due to fluid overload with no response to IV lasix. PMH of CHF, COPD, HTN, CABG, non-alcohol cirrhosis.   Pt state that his appetite has been great since he has been admitted. 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 Pt reports his typical intake as; Breakfast: [employer] bar; Lunch: Apple; Dinner: canned spaghetti. Pt stated he drinks a lot of Body Armour and Gatorade. Pt reports that he does try to follow a low-sodium diet and reads labels on the cans. States that he lives alone.  Per EMR, pt ate 100% of his dinner on 10/6.  Pt states that his UBW is 136#, but he fluctuates a significant amount. States that if he eats a lot of sodium then he will be dealing with it for the next few days or that the Lasix will work one day and not the next. Per EMR, pt has lost 11% in 6 months, unsure on the accuracy of weight due to pt's CHF and retaining weight often.   Pt requesting diet education material on a low-sodium diet.   When discussing ONS with pt; pt states that he does not drink them due to being on warfarin.  Medications  reviewed and include: Demadex, Warfarin, Spironolactone, Pepcid Labs reviewed: Sodium 134, BUN 36, Creatinine 1.75  Admission Weight: 60.3 kg Current Weight: 60.5 kg  UOP: 2900 mL x 24 hr I/O's: -6321 since admit   NUTRITION - FOCUSED PHYSICAL EXAM:  Flowsheet Row Most Recent Value  Orbital Region Severe depletion  Upper Arm Region Severe depletion  Thoracic and Lumbar Region Severe depletion  Buccal Region Severe depletion  Temple Region Severe depletion  Clavicle Bone Region Severe depletion  Clavicle and Acromion Bone Region Severe depletion  Scapular Bone Region Severe depletion  Dorsal Hand Severe depletion  Patellar Region Moderate depletion  Anterior Thigh Region Moderate depletion  Posterior Calf Region Moderate depletion  Edema (RD Assessment) None  Hair Reviewed  Eyes Reviewed  Mouth Reviewed  Skin Reviewed  Nails Reviewed       Diet Order:   Diet Order             Diet Heart Room service appropriate? Yes; Fluid consistency: Thin  Diet effective now                   EDUCATION NEEDS:   Education needs have been addressed  Skin:  Skin Assessment: Reviewed RN Assessment (Tear - R arm)  Last BM:  10/02/21  Height:   Ht Readings from Last 1 Encounters:  09/29/21 _0  (1.753 m)    Weight:  Wt Readings from Last 1 Encounters:  10/03/21 60.5 kg    Ideal Body Weight:  72.7 kg  BMI:  Body mass index is 19.7 kg/m.  Estimated Nutritional Needs:   Kcal:  1800-2000  Protein:  90-105 grams  Fluid:  >/= 1.8 L    Lennan Malone BS, PLDN Clinical Dietitian See AMiON for contact information.

## 2021-10-03 NOTE — Progress Notes (Addendum)
Advanced Heart Failure Rounding Note  PCP-Cardiologist: None   Subjective:    Off IV lasix 10/08. Po torsemide started 10/09.  Scr  1.48 > 1.85 > 1.75  Weight down another 2 lb. Good output last 24 hrs. Dyspnea improved. Only complaint is intermittent burning in feet  INR 2.1. Heparin D/C'd yesterday d/t bleeding risk.   RHC, 09/29/21 Findings:   RA = 12 RV = 71/9 PA = 79/20 (38) PCW = 21 (v = 30) Fick cardiac output/index = 7.0/3.14 PVR = 3.1 WU FA sat = 99% PA sat = 70%, 74% PAPi = 4.9    Assessment: 1. Moderate mixed PAH with mild to moderate volume overload   Plan/Discussion:    Admit for diuresis.      Objective:   Weight Range: 60.5 kg Body mass index is 19.7 kg/m.   Vital Signs:   Temp:  [97.4 F (36.3 C)-97.9 F (36.6 C)] 97.4 F (36.3 C) (10/10 0453) Pulse Rate:  [58-70] 70 (10/10 0453) Resp:  [16-18] 16 (10/10 0453) BP: (114-123)/(62-78) 123/78 (10/10 0453) SpO2:  [97 %] 97 % (10/10 0453) Weight:  [60.5 kg] 60.5 kg (10/10 0453) Last BM Date: 10/02/21  Weight change: Filed Weights   10/01/21 0535 10/02/21 0500 10/03/21 0453  Weight: 60.1 kg 61.4 kg 60.5 kg    Intake/Output:   Intake/Output Summary (Last 24 hours) at 10/03/2021 1023 Last data filed at 10/03/2021 0700 Gross per 24 hour  Intake 480 ml  Output 2900 ml  Net -2420 ml      Physical Exam    General:  Sitting up in bed. No resp difficulty HEENT: normal Neck: supple. JVP 9-10. Carotids 2+ bilat; + bruits. No lymphadenopathy or thryomegaly appreciated. Cor: PMI nondisplaced. Irregular rate & rhythm. 2/6 SEM.  Lungs: clear Abdomen: soft, nontender, nondistended. No hepatosplenomegaly. No bruits or masses. Good bowel sounds. Extremities: no cyanosis, clubbing, rash, tr edema. Severe varicose veins and multiple ecchymoses  Neuro: alert & orientedx3, cranial nerves grossly intact. moves all 4 extremities w/o difficulty. Affect pleasant  Telemetry   Atrial  fibrillation 50-60s, one 23 beat run wide complex rhythm with rate in 70s. Personally reviewed   Labs    CBC Recent Labs    10/02/21 0137 10/03/21 0541  WBC 4.9 4.3  HGB 10.6* 11.3*  HCT 32.9* 36.2*  MCV 78.0* 79.6*  PLT 217 701   Basic Metabolic Panel Recent Labs    09/30/21 2331 10/02/21 0137 10/02/21 2349 10/03/21 0541  NA 129*   < > 130* 134*  K 4.2   < > 4.2 4.6  CL 92*   < > 93* 97*  CO2 26   < > 27 26  GLUCOSE 117*   < > 116* 94  BUN 26*   < > 36* 36*  CREATININE 1.65*   < > 1.85* 1.75*  CALCIUM 9.9   < > 9.8 9.9  MG 2.2  --  2.0  --    < > = values in this interval not displayed.   Liver Function Tests No results for input(s): AST, ALT, ALKPHOS, BILITOT, PROT, ALBUMIN in the last 72 hours. No results for input(s): LIPASE, AMYLASE in the last 72 hours. Cardiac Enzymes No results for input(s): CKTOTAL, CKMB, CKMBINDEX, TROPONINI in the last 72 hours.  BNP: BNP (last 3 results) Recent Labs    09/12/21 1238 09/27/21 1546 09/30/21 0241  BNP 1,141.4* 953.2* 840.7*    ProBNP (last 3 results) Recent Labs  07/04/21 1607 08/16/21 1620  PROBNP 425.0* 601.0*     D-Dimer No results for input(s): DDIMER in the last 72 hours. Hemoglobin A1C No results for input(s): HGBA1C in the last 72 hours. Fasting Lipid Panel No results for input(s): CHOL, HDL, LDLCALC, TRIG, CHOLHDL, LDLDIRECT in the last 72 hours. Thyroid Function Tests No results for input(s): TSH, T4TOTAL, T3FREE, THYROIDAB in the last 72 hours.  Invalid input(s): FREET3  Other results:   Imaging    No results found.   Medications:     Scheduled Medications:  aspirin EC  81 mg Oral q AM   famotidine  20 mg Oral QHS   macitentan  10 mg Oral Daily   midodrine  15 mg Oral TID WC   rosuvastatin  5 mg Oral QPM   Selexipag  200 mcg Oral QPM   Selexipag  400 mcg Oral q AM   sodium chloride flush  3 mL Intravenous Q12H   spironolactone  12.5 mg Oral Daily   tadalafil  40 mg Oral q  AM   torsemide  40 mg Oral Daily   Warfarin - Pharmacist Dosing Inpatient   Does not apply q1600    Infusions:  sodium chloride      PRN Medications: sodium chloride, acetaminophen, albuterol, loratadine, ondansetron (ZOFRAN) IV, senna-docusate, sodium chloride flush     Assessment/Plan   1. Acute on chronic diastolic HF - not responding well to outpatient management.  - admitted 10/06 for IV diuresis.  - IV lasix stopped due to bump in Scr. Down 2 lb overnight after restarting torsemide. Good output last 24 hrs. Volume stable.  - Consider SGLT2i at f/u if scr remains stable. - Continue spiro at 25 daily  2. Pulmonary hypertension with cor pulmonale/RV failure - WHO Group I & II - Auto-immune serologies negative (checked twice). - ? Component of HHT/shunt/AVMs with late bubbles on bubble study.  - Echo with bubble study (05/16/18): EF 55-60% with "late bubbles" , Mechanical AVR stable with trivial perivalvular regurg, Mild MR, Severe LAE, Severe RV dilation and reduced function. No PFO. Mod TR, PA peak pressure 68 mm Hg - Echo (4/22): EF 50-55%, RV ok, severe RVSP 61.6 mmHg, severe biatrial enlargement, moderate TR, stable AVR, AV mean gradient 11 mmHg - Echo 10/22 EF 45-50 RV low normal  - RHC data as above - Ab u/s with cirrhosis but no evidence of portal HTN. - On triple therapy with Macitentan and Adcirca. Uptravi dose at 400/200 limited by cramping.  Numbers not c/w need for IV therapies   3. Chronic respiratory failure - [place] CT 05/16/18 with "dependent basilar predominant patchy subpleural reticulation and ground-glass attenuation with the suggestion of minimal associated traction bronchiolectasis. No frank honeycombing. Findings may indicate an interstitial lung disease such as early usual interstitial pneumonia (UIP) or fibrotic phase nonspecific interstitial pneumonia (NSIP)." - Followed by Dr. Lamonte Sakai. - Stable   4. Chronic AFL/pauses - Rate controlled, rate  50s-60s.  Now off metoprolol.  - Multiple pauses 2-3sc overnight on 10/6. No further pauses noted. Keep off beta blocker.  - Continue coumadin with mechanical AVR.   5. Frequent PVCs - this may be contributing to HF - repeat echo stable  - between 2-7/min on tele overnight  6 CAD - s/p CABG 07/2017 with Dr Roderic Palau in [place].  - No s/s ischemia - Continue Crestor 5 mg daily (unable to tolerate atorva due to myalgias).  - Lipids followed by Dr. Bettina Gavia.   7. S/p mechanical  AVR - Stable on echo 4/22. - On heparin. INR 2.0.  Stop heparin given significant bleeding risk    8. Leukocytoclastic vasculitis - F/u with Rheumatology.  - No changes.   9. Cirrhosis - US Abdomen RUQ 05/16/18 - s/p cholecystectomy. + hepatic cirrhosis. Mild ascites.  - Liver Doppler 05/16/18 - No hepatic, splenic, or portal venous thrombosis or occlusion. Mild ascites. - Was taking midodrine 15 bid at home. Now on 15 tid here due to low BP   10. Hyponatremia -Improved to 134 after 30 mg tolvaptan again yesterday - continue free water restrict  11. Hypokalemia - Resolved. K 4.6 today. - Continue spiro 25 daily  Will plan for discharge home later today. Will need f/u labs later this week to monitor sodium and potassium.    Length of Stay: 4  FINCH, LINDSAY N, PA-C  10/03/2021, 10:23 AM  Advanced Heart Failure Team Pager [phone] (M-F; 7a - 5p)  Please contact [facility] for night-coverage after hours (5p -7a ) and weekends on [email or web address]  Patient seen and examined with the above-signed Advanced Practice Provider and/or Housestaff. I personally reviewed laboratory data, imaging studies and relevant notes. I independently examined the patient and formulated the important aspects of the plan. I have edited the note to reflect any of my changes or salient points. I have personally discussed the plan with the patient and/or family.  Feeling better. Able to walk halls. Serum sodium better with tolvaptan.  Weight down 3 pounds.   INR 2.1. Creatinine improved.   General:  sitting in chair No resp difficulty HEENT: normal Neck: supple. JVP 10 with prominent v waves . Carotids 2+ bilat; no bruits. No lymphadenopathy or thryomegaly appreciated. Cor: PMI nondisplaced. Irregular rate & rhythm. 2/6 SEM RSB Lungs: clear Abdomen: soft, nontender, nondistended. No hepatosplenomegaly. No bruits or masses. Good bowel sounds. Extremities: no cyanosis, clubbing, rash, edema severe varicose veins. Diffuse ecchymoses/purpura Neuro: alert & orientedx3, cranial nerves grossly intact. moves all 4 extremities w/o difficulty. Affect pleasant  Improving. Volume status better. INR 2.1. Sodium and potassium improved.   Continue current regimen. Restart spiro. Likely home tomorrow.   Glori Bickers, MD  4:36 PM

## 2021-10-04 ENCOUNTER — Telehealth: Payer: Self-pay | Admitting: Medical

## 2021-10-04 DIAGNOSIS — I5033 Acute on chronic diastolic (congestive) heart failure: Secondary | ICD-10-CM | POA: Diagnosis not present

## 2021-10-04 LAB — PROTIME-INR
INR: 2.7 — ABNORMAL HIGH (ref 0.8–1.2)
Prothrombin Time: 28.8 seconds — ABNORMAL HIGH (ref 11.4–15.2)

## 2021-10-04 LAB — BASIC METABOLIC PANEL
Anion gap: 11 (ref 5–15)
BUN: 41 mg/dL — ABNORMAL HIGH (ref 8–23)
CO2: 22 mmol/L (ref 22–32)
Calcium: 9.3 mg/dL (ref 8.9–10.3)
Chloride: 94 mmol/L — ABNORMAL LOW (ref 98–111)
Creatinine, Ser: 2.05 mg/dL — ABNORMAL HIGH (ref 0.61–1.24)
GFR, Estimated: 33 mL/min — ABNORMAL LOW (ref >60)
Glucose, Bld: 118 mg/dL — ABNORMAL HIGH (ref 70–99)
Potassium: 3.4 mmol/L — ABNORMAL LOW (ref 3.5–5.1)
Sodium: 127 mmol/L — ABNORMAL LOW (ref 135–145)

## 2021-10-04 LAB — CBC
HCT: 32.9 % — ABNORMAL LOW (ref 39.0–52.0)
Hemoglobin: 10.2 g/dL — ABNORMAL LOW (ref 13.0–17.0)
MCH: 24.6 pg — ABNORMAL LOW (ref 26.0–34.0)
MCHC: 31 g/dL (ref 30.0–36.0)
MCV: 79.3 fL — ABNORMAL LOW (ref 80.0–100.0)
Platelets: 204 10*3/uL (ref 150–400)
RBC: 4.15 MIL/uL — ABNORMAL LOW (ref 4.22–5.81)
RDW: 22.4 % — ABNORMAL HIGH (ref 11.5–15.5)
WBC: 4.4 10*3/uL (ref 4.0–10.5)
nRBC: 0 % (ref 0.0–0.2)

## 2021-10-04 MED ORDER — POTASSIUM CHLORIDE CRYS ER 20 MEQ PO TBCR
40.0000 meq | EXTENDED_RELEASE_TABLET | Freq: Once | ORAL | Status: DC
  Filled 2021-10-04: qty 2

## 2021-10-04 MED ORDER — TORSEMIDE 20 MG PO TABS: 40.0000 mg | ORAL_TABLET | Freq: Two times a day (BID) | ORAL | 6 refills | Status: DC

## 2021-10-04 MED ORDER — WARFARIN SODIUM 1 MG PO TABS
1.0000 mg | ORAL_TABLET | Freq: Once | ORAL | Status: AC
  Administered 2021-10-04: 1 mg via ORAL
  Filled 2021-10-04: qty 1

## 2021-10-04 MED ORDER — POTASSIUM CHLORIDE CRYS ER 20 MEQ PO TBCR: 40.0000 meq | EXTENDED_RELEASE_TABLET | Freq: Two times a day (BID) | ORAL | 6 refills | Status: DC

## 2021-10-04 MED ORDER — POTASSIUM CHLORIDE CRYS ER 10 MEQ PO TBCR
40.0000 meq | EXTENDED_RELEASE_TABLET | Freq: Once | ORAL | Status: AC
  Administered 2021-10-04: 40 meq via ORAL
  Filled 2021-10-04: qty 4

## 2021-10-04 MED ORDER — SPIRONOLACTONE 25 MG PO TABS: 25.0000 mg | ORAL_TABLET | Freq: Every day | ORAL | 3 refills | Status: DC

## 2021-10-04 MED ORDER — WARFARIN SODIUM 5 MG PO TABS: ORAL_TABLET | ORAL | 5 refills | Status: DC

## 2021-10-04 MED ORDER — POTASSIUM CHLORIDE CRYS ER 20 MEQ PO TBCR
40.0000 meq | EXTENDED_RELEASE_TABLET | Freq: Two times a day (BID) | ORAL | 6 refills | Status: DC
Start: 2021-10-06 — End: 2021-10-04

## 2021-10-04 MED ORDER — MIDODRINE HCL 5 MG PO TABS: 15.0000 mg | ORAL_TABLET | Freq: Three times a day (TID) | ORAL | 3 refills | Status: DC

## 2021-10-04 MED ORDER — POTASSIUM CHLORIDE CRYS ER 10 MEQ PO TBCR: 40.0000 meq | EXTENDED_RELEASE_TABLET | Freq: Two times a day (BID) | ORAL | 3 refills | Status: DC

## 2021-10-04 MED FILL — Midazolam HCl Inj 2 MG/2ML (Base Equivalent): INTRAMUSCULAR | Qty: 2 | Status: AC

## 2021-10-04 MED FILL — Fentanyl Citrate Preservative Free (PF) Inj 100 MCG/2ML: INTRAMUSCULAR | Qty: 2 | Status: AC

## 2021-10-04 MED FILL — Heparin Sod (Porcine)-NaCl IV Soln 1000 Unit/500ML-0.9%: INTRAVENOUS | Qty: 500 | Status: AC

## 2021-10-04 NOTE — Progress Notes (Addendum)
Advanced Heart Failure Rounding Note  PCP-Cardiologist: None   Subjective:    10/8 IV lasix stopped.  10/9 Started on torsemide.    Scr  1.48 > 1.85 > 1.75>2   Thirsty. Denies SOB.   RHC, 09/29/21 Findings:  RA = 12 RV = 71/9 PA = 79/20 (38) PCW = 21 (v = 30) Fick cardiac output/index = 7.0/3.14 PVR = 3.1 WU FA sat = 99% PA sat = 70%, 74% PAPi = 4.9   Assessment: 1. Moderate mixed PAH with mild to moderate volume overload  Plan/Discussion:   Admit for diuresis.      Objective:   Weight Range: 61 kg Body mass index is 19.85 kg/m.   Vital Signs:   Temp:  [97.2 F (36.2 C)-97.6 F (36.4 C)] 97.6 F (36.4 C) (10/11 0401) Pulse Rate:  [60-68] 68 (10/11 0401) Resp:  [16-17] 17 (10/10 2215) BP: (101-124)/(64-74) 101/64 (10/11 0401) SpO2:  [97 %] 97 % (10/10 1514) Weight:  [61 kg] 61 kg (10/11 0401) Last BM Date: 10/02/21  Weight change: Filed Weights   10/02/21 0500 10/03/21 0453 10/04/21 0401  Weight: 61.4 kg 60.5 kg 61 kg    Intake/Output:   Intake/Output Summary (Last 24 hours) at 10/04/2021 0937 Last data filed at 10/04/2021 0919 Gross per 24 hour  Intake 720 ml  Output 2925 ml  Net -2205 ml      Physical Exam    General: Sitting in the chair.  No resp difficulty HEENT: normal Neck: supple. JVP 6-7 . Carotids 2+ bilat; no bruits. No lymphadenopathy or thryomegaly appreciated. Cor: PMI nondisplaced. Irregular rate & rhythm. No rubs, gallops.   Lungs: clear Abdomen: soft, nontender, nondistended. No hepatosplenomegaly. No bruits or masses. Good bowel sounds. Extremities: no cyanosis, clubbing, rash, edema Neuro: alert & orientedx3, cranial nerves grossly intact. moves all 4 extremities w/o difficulty. Affect pleasant  Telemetry   A fib  Labs    CBC Recent Labs    10/03/21 0541 10/04/21 0101  WBC 4.3 4.4  HGB 11.3* 10.2*  HCT 36.2* 32.9*  MCV 79.6* 79.3*  PLT 217 384   Basic Metabolic Panel Recent Labs    10/02/21 2349  10/03/21 0541 10/04/21 0101  NA 130* 134* 127*  K 4.2 4.6 3.4*  CL 93* 97* 94*  CO2 _0 GLUCOSE 116* 94 118*  BUN 36* 36* 41*  CREATININE 1.85* 1.75* 2.05*  CALCIUM 9.8 9.9 9.3  MG 2.0  --   --    Liver Function Tests No results for input(s): AST, ALT, ALKPHOS, BILITOT, PROT, ALBUMIN in the last 72 hours. No results for input(s): LIPASE, AMYLASE in the last 72 hours. Cardiac Enzymes No results for input(s): CKTOTAL, CKMB, CKMBINDEX, TROPONINI in the last 72 hours.  BNP: BNP (last 3 results) Recent Labs    09/12/21 1238 09/27/21 1546 09/30/21 0241  BNP 1,141.4* 953.2* 840.7*    ProBNP (last 3 results) Recent Labs    07/04/21 1607 08/16/21 1620  PROBNP 425.0* 601.0*     D-Dimer No results for input(s): DDIMER in the last 72 hours. Hemoglobin A1C No results for input(s): HGBA1C in the last 72 hours. Fasting Lipid Panel No results for input(s): CHOL, HDL, LDLCALC, TRIG, CHOLHDL, LDLDIRECT in the last 72 hours. Thyroid Function Tests No results for input(s): TSH, T4TOTAL, T3FREE, THYROIDAB in the last 72 hours.  Invalid input(s): FREET3  Other results:   Imaging    No results found.   Medications:  Scheduled Medications:  aspirin EC  81 mg Oral q AM   famotidine  20 mg Oral QHS   macitentan  10 mg Oral Daily   midodrine  15 mg Oral TID WC   multivitamin with minerals  1 tablet Oral Daily   potassium chloride  40 mEq Oral Once   rosuvastatin  5 mg Oral QPM   Selexipag  200 mcg Oral QPM   Selexipag  400 mcg Oral q AM   sodium chloride flush  3 mL Intravenous Q12H   spironolactone  25 mg Oral Daily   tadalafil  40 mg Oral q AM   Warfarin - Pharmacist Dosing Inpatient   Does not apply q1600    Infusions:  sodium chloride      PRN Medications: sodium chloride, acetaminophen, albuterol, loratadine, ondansetron (ZOFRAN) IV, senna-docusate, sodium chloride flush     Assessment/Plan   1. Acute on chronic diastolic HF - not  responding well to outpatient management.  - admitted 10/06 for IV diuresis.  -  Creatinine bumped. Holding torsemide today  - Consider SGLT2i at f/u if scr remains stable. - Continue spiro at 25 daily  2. Pulmonary hypertension with cor pulmonale/RV failure - WHO Group I & II - Auto-immune serologies negative (checked twice). - ? Component of HHT/shunt/AVMs with late bubbles on bubble study.  - Echo with bubble study (05/16/18): EF 55-60% with "late bubbles" , Mechanical AVR stable with trivial perivalvular regurg, Mild MR, Severe LAE, Severe RV dilation and reduced function. No PFO. Mod TR, PA peak pressure 68 mm Hg - Echo (4/22): EF 50-55%, RV ok, severe RVSP 61.6 mmHg, severe biatrial enlargement, moderate TR, stable AVR, AV mean gradient 11 mmHg - Echo 10/22 EF 45-50 RV low normal  - RHC data as above - Ab u/s with cirrhosis but no evidence of portal HTN. - On triple therapy with Macitentan and Adcirca. Uptravi dose at 400/200 limited by cramping.  Numbers not c/w need for IV therapies   3. Chronic respiratory failure - [place] CT 05/16/18 with "dependent basilar predominant patchy subpleural reticulation and ground-glass attenuation with the suggestion of minimal associated traction bronchiolectasis. No frank honeycombing. Findings may indicate an interstitial lung disease such as early usual interstitial pneumonia (UIP) or fibrotic phase nonspecific interstitial pneumonia (NSIP)." - Followed by Dr. Lamonte Sakai. - Stable on room air.    4. Chronic AFL/pauses - Rate controlled, rate 50s-60s.  Now off metoprolol.  - Multiple pauses 2-3sc on 10/6. No further pauses noted. Keep off beta blocker.  - Continue coumadin with mechanical AVR.   5. Frequent PVCs - this may be contributing to HF - repeat echo stable  -Rare PVCs   6 CAD - s/p CABG 07/2017 with Dr Roderic Palau in [place].  - No s/s ischemia - Continue Crestor 5 mg daily (unable to tolerate atorva due to myalgias).  - Lipids followed by  Dr. Bettina Gavia.   7. S/p mechanical AVR - Stable on echo 4/22. - Off heparin.  - On coumadin. INR 2.7.     8. Leukocytoclastic vasculitis - F/u with Rheumatology.  - No changes.   9. Cirrhosis - US Abdomen RUQ 05/16/18 - s/p cholecystectomy. + hepatic cirrhosis. Mild ascites.  - Liver Doppler 05/16/18 - No hepatic, splenic, or portal venous thrombosis or occlusion. Mild ascites. - Was taking midodrine 15 bid at home. Continue 15 tid here due to low BP   10. Hyponatremia -Improved to 134 after 30 mg tolvaptan  10/9  - Todays sodium  127.  - continue free water restrict  11. Hypokalemia - Resolved. K 3.4 today . Given 28mq K x1  - Continue spiro 25 daily   Length of Stay: 5  Amy Clegg, NP  10/04/2021, 9:37 AM  Advanced Heart Failure Team Pager [phone](M-F; 7a - 5p)  Please contact [facility] for night-coverage after hours (5p -7a ) and weekends on [email or web address]  Feels much better today. Says his legs are itchy. SCr up a bit. K down  He wants to lower INR   General:  Chronically-ill appearing. No resp difficulty HEENT: normal Neck: supple. JVP 7 . Carotids 2+ bilat; no bruits. No lymphadenopathy or thryomegaly appreciated. Cor: PMI nondisplaced. Irregular rate & rhythm. 2/6 SEM  Lungs: clear Abdomen: soft, nontender, nondistended. No hepatosplenomegaly. No bruits or masses. Good bowel sounds. Extremities: no cyanosis, clubbing, rash, edema multiple ecchymoses/purpura Neuro: alert & orientedx3, cranial nerves grossly intact. moves all 4 extremities w/o difficulty. Affect pleasant  [place] Will arrange f/u in 1 week. Ok to decrease INR goal with ongoing severe ecchymoses/purpura. He is aware of increased risk of CVA in setting of AVR and AF.   DGlori Bickers MD  4:42 PM

## 2021-10-04 NOTE — Plan of Care (Signed)
  Problem: Education: Goal: Knowledge of General Education information will improve Description: Including pain rating scale, medication(s)/side effects and non-pharmacologic comfort measures Outcome: Adequate for Discharge   Problem: Health Behavior/Discharge Planning: Goal: Ability to manage health-related needs will improve Outcome: Adequate for Discharge   Problem: Clinical Measurements: Goal: Ability to maintain clinical measurements within normal limits will improve Outcome: Adequate for Discharge Goal: Will remain free from infection Outcome: Adequate for Discharge Goal: Diagnostic test results will improve Outcome: Adequate for Discharge Goal: Respiratory complications will improve Outcome: Adequate for Discharge Goal: Cardiovascular complication will be avoided Outcome: Adequate for Discharge   Problem: Activity: Goal: Risk for activity intolerance will decrease Outcome: Adequate for Discharge   Problem: Nutrition: Goal: Adequate nutrition will be maintained Outcome: Adequate for Discharge   Problem: Coping: Goal: Level of anxiety will decrease Outcome: Adequate for Discharge   Problem: Elimination: Goal: Will not experience complications related to bowel motility Outcome: Adequate for Discharge Goal: Will not experience complications related to urinary retention Outcome: Adequate for Discharge   Problem: Pain Managment: Goal: General experience of comfort will improve Outcome: Adequate for Discharge   Problem: Safety: Goal: Ability to remain free from injury will improve Outcome: Adequate for Discharge   Problem: Skin Integrity: Goal: Risk for impaired skin integrity will decrease Outcome: Adequate for Discharge   Problem: Education: Goal: Understanding of CV disease, CV risk reduction, and recovery process will improve Outcome: Adequate for Discharge Goal: Individualized Educational Video(s) Outcome: Adequate for Discharge   Problem:  Activity: Goal: Ability to return to baseline activity level will improve Outcome: Adequate for Discharge   Problem: Cardiovascular: Goal: Ability to achieve and maintain adequate cardiovascular perfusion will improve Outcome: Adequate for Discharge Goal: Vascular access site(s) Level 0-1 will be maintained Outcome: Adequate for Discharge   Problem: Health Behavior/Discharge Planning: Goal: Ability to safely manage health-related needs after discharge will improve Outcome: Adequate for Discharge   Problem: Education: Goal: Ability to demonstrate management of disease process will improve Outcome: Adequate for Discharge Goal: Ability to verbalize understanding of medication therapies will improve Outcome: Adequate for Discharge Goal: Individualized Educational Video(s) Outcome: Adequate for Discharge   Problem: Activity: Goal: Capacity to carry out activities will improve Outcome: Adequate for Discharge   Problem: Cardiac: Goal: Ability to achieve and maintain adequate cardiopulmonary perfusion will improve Outcome: Adequate for Discharge

## 2021-10-04 NOTE — Progress Notes (Signed)
Mobility Specialist Progress Note    10/04/21 1529  Mobility  Activity Refused mobility   Pt already walked and getting ready to go home.   Hildred Alamin Mobility Specialist  Mobility Specialist Phone: [phone]

## 2021-10-04 NOTE — Care Management Important Message (Signed)
Important Message  Patient Details  Name: Lance Hicks MRN: [id number] Date of Birth: Jan 05, 1946   Medicare Important Message Given:  Yes     Shelda Altes 10/04/2021, 9:41 AM

## 2021-10-04 NOTE — Telephone Encounter (Signed)
Please schedule him a hospital follow up

## 2021-10-04 NOTE — Telephone Encounter (Signed)
Patient is currently admitted at [facility] and is about to be discharged. Patient was advised to contact pcp to have meds prescribed for satellites on legs. Please advise.

## 2021-10-04 NOTE — Progress Notes (Signed)
ANTICOAGULATION CONSULT NOTE - Follow Up Consult  Pharmacy Consult for Warfarin  Indication: atrial fibrillation and mechanical aortic valve  Allergies  Allergen Reactions   Pantoprazole Sodium Nausea Only    Gets gassy and starting itching like crazy Gets gassy and starting itching like crazy   Valium [Diazepam] Other (See Comments)    HA and Abd pain.   Tape Rash and Other (See Comments)    Surgical tape   Wound Dressing Adhesive Other (See Comments) and Rash    Surgical tape Surgical tape Surgical tape    Patient Measurements: Height: _0  (175.3 cm) Weight: 61 kg (134 lb 6.4 oz) IBW/kg (Calculated) : 70.7 Heparin Dosing Weight: 6  Vital Signs: Temp: 98.2 F (36.8 C) (10/11 1402) Temp Source: Oral (10/11 0401) BP: 110/65 (10/11 1402) Pulse Rate: 68 (10/11 0401)  Labs: Recent Labs    10/01/21 1806 10/02/21 0137 10/02/21 0137 10/02/21 0722 10/02/21 2349 10/03/21 0541 10/04/21 0101  HGB  --  10.6*   < >  --   --  11.3* 10.2*  HCT  --  32.9*  --   --   --  36.2* 32.9*  PLT  --  217  --   --   --  217 204  LABPROT  --  22.9*  --   --   --  23.3* 28.8*  INR  --  2.0*  --   --   --  2.1* 2.7*  HEPARINUNFRC 0.21*  --   --  0.57  --   --   --   CREATININE  --  1.48*   < >  --  1.85* 1.75* 2.05*   < > = values in this interval not displayed.     Estimated Creatinine Clearance: 27.3 mL/min (A) (by C-G formula based on SCr of 2.05 mg/dL (H)).  Assessment:  75 year old male with history of afib and Mechanical aortic valve replacement on chronic coumadin. Patient admitted post Brush. PTA warfarin schedule is 2.5 mg Sun, Mon, Tue, Thu, Fri and 5 mg on Wed and Sat. INR low at 1.7 on admit.   INR up to 2.7 this morning, rising quickly, heparin stopped 10/9.  CBC stable.   Goal of Therapy:  INR goal 2.5-3 Monitor platelets by anticoagulation protocol: Yes   Plan:  Warfarin 1 mg x 1 tonight, then 2.5 mg daily until INR rechecked as outpatient early next  week.  [place] Crane, Roylene Reason, BCCP Clinical Pharmacist  10/04/2021 2:14 PM   [facility] pharmacy phone numbers are listed on [email or web address]

## 2021-10-05 ENCOUNTER — Encounter: Payer: Self-pay | Admitting: Gastroenterology

## 2021-10-05 ENCOUNTER — Other Ambulatory Visit: Payer: Self-pay

## 2021-10-05 ENCOUNTER — Ambulatory Visit (INDEPENDENT_AMBULATORY_CARE_PROVIDER_SITE_OTHER): Payer: Medicare Other | Admitting: Gastroenterology

## 2021-10-05 ENCOUNTER — Telehealth: Payer: Self-pay

## 2021-10-05 VITALS — BP 130/84 | HR 75 | Ht 69.0 in | Wt 142.0 lb

## 2021-10-05 DIAGNOSIS — R195 Other fecal abnormalities: Secondary | ICD-10-CM

## 2021-10-05 DIAGNOSIS — D509 Iron deficiency anemia, unspecified: Secondary | ICD-10-CM

## 2021-10-05 DIAGNOSIS — K219 Gastro-esophageal reflux disease without esophagitis: Secondary | ICD-10-CM | POA: Diagnosis not present

## 2021-10-05 DIAGNOSIS — K7460 Unspecified cirrhosis of liver: Secondary | ICD-10-CM | POA: Diagnosis not present

## 2021-10-05 MED ORDER — RABEPRAZOLE SODIUM 20 MG PO TBEC: 20.0000 mg | DELAYED_RELEASE_TABLET | Freq: Every day | ORAL | 4 refills | Status: DC

## 2021-10-05 NOTE — Telephone Encounter (Signed)
Transition Care Management Follow-up Telephone Call Date of discharge and from where: 10/04/2021 / [facility] How have you been since you were released from the hospital? " Doing good" Any questions or concerns? No  Items Reviewed: Did the pt receive and understand the discharge instructions provided? Yes  Medications obtained and verified? Yes  Other? No  Any new allergies since your discharge? No  Dietary orders reviewed? Yes low salt, heart healthy Do you have support at home? Yes   Home Care and Equipment/Supplies: Were home health services ordered? not applicable If so, what is the name of the agency? N/a Has the agency set up a time to come to the patient's home? not applicable Were any new equipment or medical supplies ordered?  No What is the name of the medical supply agency? N/a Were you able to get the supplies/equipment? not applicable Do you have any questions related to the use of the equipment or supplies? No  Functional Questionnaire: (I = Independent and D = Dependent) ADLs: I  Bathing/Dressing- I  Meal Prep- I  Eating- I  Maintaining continence- I  Transferring/Ambulation- I  Managing Meds- I  Follow up appointments reviewed:  PCP Hospital f/u appt confirmed? Yes  Scheduled to see Dr. Ainsley Spinner on 10/11/2021 @ 3:20pm. [facility] f/u appt confirmed? Yes  Scheduled to see Dr. [place] Pink on 11/30/2021 @ 1:40pm. Are transportation arrangements needed? No  If their condition worsens, is the pt aware to call PCP or go to the Emergency Dept.? Yes Was the patient provided with contact information for the PCP's office or ED? Yes Was to pt encouraged to call back with questions or concerns? Yes  Quinn Plowman RN,BSN,CCM RN Case Manager [facility] [phone]

## 2021-10-05 NOTE — Patient Instructions (Signed)
If you are age 74 or older, your body mass index should be between 23-30. Your Body mass index is 20.97 kg/m. If this is out of the aforementioned range listed, please consider follow up with your Primary Care Provider.  If you are age 65 or younger, your body mass index should be between 19-25. Your Body mass index is 20.97 kg/m. If this is out of the aformentioned range listed, please consider follow up with your Primary Care Provider.   __________________________________________________________  The  GI providers would like to encourage you to use [facility] to communicate with providers for non-urgent requests or questions.  Due to long hold times on the telephone, sending your provider a message by [facility] may be a faster and more efficient way to get a response.  Please allow 48 business hours for a response.  Please remember that this is for non-urgent requests.   We have sent the following medications to your pharmacy for you to pick up at your convenience: Aciphex  It has been recommended to you by your physician that you have a(n) EGD/Colon completed. Per your request, we did not schedule the procedure(s) today. Please contact our office at [phone] should you decide to have the procedure completed. You will be scheduled for a pre-visit and procedure at that time.  Please call with any questions or concerns.  Thank you,  Dr. Jackquline Denmark

## 2021-10-05 NOTE — Progress Notes (Signed)
Chief Complaint: GERD  Referring Provider:  [name], PA-C      ASSESSMENT AND PLAN;   #1. IDA with H+ stools  #2. GERD (s/p EGD 06/2017 - at [facility] [place], [place] -report reviewed, mild gastritis with negative biopsies, no varices). Had allergic reaction to protonix. Now with bloating.  #2.  Liver Cirrhosis (likely cardiac liver cirrhosis with congestive hepatomegaly). No ETOH. No evidence of portal hypertension.  Mild ascites likely d/t right heart failure with cor pulmonale and pulmonary HTN. Neg [place] with doppler 04/2018. Nl AFP 3.2 04/2019. Alb 4.1 05/2019. [place] 06/06/2019-congestive hepatomegaly, prominent hepatic veins and IVC d/t elevated right heart pressures.  #3. Rectal bleeding with constipation d/t internal hemorrhoids. Neg hemoccult. Neg colon in [place] (resolved).  #4  Anemia of chronic disease.  No overt GI bleeding.  #5. Co-morbid conditions include COPD,RV failure with pulmonary HTN/cor pulmonale, Afib with Nl EF 55% (07/2019), CAD s/p CABG, mechanical AVR on Coumadin, chronic respiratory failure, HLD, HTN.  Leuko-classic vasculitis.  #6.  RUQ abdominal pain likely d/t congestive hepatomegaly.    Plan: - colace 1 tab po qd as he has done already. - Continue Aciphex 75m po qd #30, 11 refills. - Ideally, EGD/colon only after cardiolgy clearence, [facility]  - Continue fluid and salt restricted diet as he has been doing. - Continue torsemide and spironolactone as per cardiology.   - Continue Midodrine 10 mg every morning, afternoon and 15 mg nightly     HPI:    Lance SHAKOORis a 75y.o. 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  For follow-up visit  Very complicated  With IDA and heme + stool.  Hb 10.2, MCV 79    Did not tolerate omeprazole or Protonix.  Has been doing well on AcipHex 20 mg p.o. once a day.  Denies having any upper GI complaints.  No nausea, vomiting, heartburn, regurgitation, odynophagia or dysphagia.  Had right upper quadrant abdominal pain attributed to congestive hepatomegaly. LFTs showed some improvement as below. [place]11[place]-showed prominent hepatic veins and IVC likely due to elevated right heart pressures.  No ascites or any other acute abnormalities.  Doppler showed normal direction of blood flow towards the liver. His diuretics were increased.  This is resulted in improvement of the right sided abdominal pain.  He has lost approximately 6 pounds over last 1 week.  Has been constipated and is better on Colace 1 tablet p.o. once a day.  No further rectal bleeding.  He has been given Hemoccult cards by Ed Saguier  [place]  He had to be on Lovenox bridging prior to colonoscopy which he did not like.  He would like to avoid colonoscopy at the present time.   Fells better since he has lost weight.  Past GI procedures : CT AP 01/2021 without contrast 1. No acute abnormality in the abdomen/pelvis. 2. Colonic diverticulosis without diverticulitis. 3. Ground-glass opacities in the lingula and to a lesser extent lower lobes consistent with COVID pneumonia. 4. Right middle lobe pulmonary nodule measuring 9 mm demonstrates 21 month stability, near certainly benign.  EGD 06/2011 as above.   Colonoscopy 2015: neg. Had small polyp  in 2012. [place] H/O acute gallstone pancreatitis 07/2017.  Underwent lap chole on 07/29/2017.  Postoperatively had intra-abdominal bleed, taken back for open laparotomy.  Had bleeding from unclear omental source.  Had 8U PRBC.  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 In light of concurrent left nephrolithiasis, will check CT renal colic next year instead of renal US.    Anticoagulated on Coumadin 01/04/2018   Anxiety 05/10/2016   Last Assessment & Plan:  Doing well off of zoloft.   Asthma    Asymptomatic microscopic hematuria 06/20/2017   Last Assessment & Plan:  Had hematuria workup in [facility] in 2016 which negative CT and cystoscopy. UA with 2+ blood last visit - we discussed recommendation for repeat workup at 5 years or if degree of hematuria progresses.    Atrial fibrillation ([place]) 03/29/2017   Atrial flutter ([place]) 03/29/2017   Backache 12/14/2015   Last Assessment & Plan:  Pain management referral for further evaluation.   Bilateral pleural effusion 08/09/2017   CHF (congestive heart failure) (HCC)    Chronic allergic rhinitis 04/28/2016   Last Assessment & Plan:  Continue astelin   Chronic anticoagulation 03/29/2017   Chronic atrial fibrillation ([place]) 07/23/2015   Last Assessment & Plan:  Coumadin and metoprolol, cardiology referral to establish care.   Chronic midline back pain 12/14/2015   Last Assessment & Plan:  Pain management referral for further evaluation.   Chronic obstructive lung disease ([place]) 06/12/2016   With hypoxia   Chronic prostatitis 07/23/2015   Last Assessment & Plan:  Has largely resolved since stopping bike riding. Recommend annual DRE AND PSA - will see back 12/2015 for annual screening, given 1st degree fhx. To call office for recurrent prostatitis symptoms.    Chronic respiratory failure with hypoxia ([name]) 07/29/2019   Cirrhosis of liver not due to alcohol ([place]) 07/01/2019   COPD (chronic obstructive pulmonary disease) ([place]) 06/12/2016   Coronary arteriosclerosis in native artery 03/29/2017   Coronary artery disease involving native coronary artery of native heart with angina pectoris ([place]) 03/29/2017   Cough 10/17/2016   Last Assessment & Plan:  Discussed typical course for acute viral  illness. If symptoms worsen or fail to improve by 7-10d, delayed ATBs, fluids, rest, NSAIDs/APAP prn. Seek care if not improving. Needs earlier INR check due to ATBs.   Dyspnea 02/01/2016   Last Assessment & Plan:  Overall improving, eval by pulm, plan for CT, neg stress test with cardiology. Recent switch to carvedilol due to side effects.   Encounter for therapeutic drug monitoring 01/06/2019   Enterococcal bacteremia    Epidermoid cyst of skin 08/24/2017   Essential hypertension 12/14/2015   Last Assessment & Plan:  Hypertension control: controlled  Medications: compliant Medication Management: as noted in orders Home blood pressure monitoring recommended additionally as needed for symptoms  The patient's care plan was reviewed and updated. Instructions and counseling were provided regarding patient goals and barriers. He was counseled to adopt a healthy lifestyle. Educational resources and self-management tools have been provided as charted in [facility] list.    H/O maze procedure 03/29/2017   [name] 03/29/2017   Overview:  2011   History of coronary artery bypass graft 03/29/2017   Hx of CABG 03/29/2017   Hyperlipidemia 03/29/2017  Hypersensitivity angiitis ([place]) 10/01/2017   Hypertensive heart disease 03/29/2017   Hypertensive heart disease with heart failure ([place]) 03/29/2017   Hypertensive heart failure ([place]) 03/29/2017   Hypokalemia due to excessive renal loss of potassium 02/18/2018   Hypotension, chronic 06/06/2018   International normalized ratio (INR) raised 07/26/2017   Kidney stone 07/23/2015   Kidney stones 07/23/2015   Overview:  x 3  Last Assessment & Plan:  By [place] has left nephrolithiasis, but not visible by KUB. Will check CT renal colic next year to assess both stone burden as well as to surveil AML.    Left ureteral stone 01/23/2018   Leukocytoclastic vasculitis ([place]) 10/01/2017   Localized edema 01/04/2018   Long term (current) use of anticoagulants 03/29/2017   Lumbar  radicular pain 01/19/2016   Lumbar radiculopathy 01/19/2016   Maculopapular rash 09/03/2017   Microscopic hematuria 06/20/2017   Last Assessment & Plan:  Had hematuria workup in [facility] in 2016 which negative CT and cystoscopy. UA with 2+ blood last visit - we discussed recommendation for repeat workup at 5 years or if degree of hematuria progresses.    Multiple nodules of lung 06/12/2016   Nasal discharge 02/25/2016   Last Assessment & Plan:  Trial zyrtec and flonase   Nephrolithiasis 07/23/2015   Overview:  x 3  Last Assessment & Plan:  By [place] has left nephrolithiasis, but not visible by KUB. Will check CT renal colic next year to assess both stone burden as well as to surveil AML.   Overview:  x 3  Last Assessment & Plan:  Has 71m nonobstructing LUP stone - not visible by KUB.  Will check renal [place]/2019 - he will contact office sooner if symptomatic.    Non-sustained ventricular tachycardia 03/29/2017   Nonsustained ventricular tachycardia 03/29/2017   Other hyperlipidemia 03/29/2017   Palpitations 10/01/2017   Pleural effusion, bilateral 08/09/2017   Pneumonia due to COVID-19 virus 02/07/2021   Post-nasal drainage 02/25/2016   Last Assessment & Plan:  Trial zyrtec and flonase   Prostate cancer screening 06/20/2017   Last Assessment & Plan:  Recommend continued annual CaP screening until within 10 years of life expectancy. Given good health and fhx of longevity, would anticipate CaP screening to continue until age 75  PSA today and again in one year on day of visit.   Pulmonary arterial hypertension ([place] 02/20/2018   Pulmonary edema    Pulmonary hypertension ([place] 08/09/2017   Pulmonary nodules 06/12/2016   S/P AVR 03/20/2016   S/P AVR (aortic valve replacement) 03/20/2016   Strain of deltoid muscle, initial encounter    Supratherapeutic INR 07/26/2017   Syncope and collapse 02/01/2016   Typical atrial flutter ([place] 02/01/2016    Past Surgical History:  Procedure Laterality Date   CHOLECYSTECTOMY     CORONARY  ARTERY BYPASS GRAFT     EXPLORATORY LAPAROTOMY  07/30/2017   FOOT SURGERY     FRACTURE SURGERY Right    wrist and forearm   HERNIA REPAIR     MECHANICAL AORTIC VALVE REPLACEMENT     NASAL SINUS SURGERY     RIGHT HEART CATH N/A 01/18/2018   Procedure: RIGHT HEART CATH;  Surgeon: BJolaine Artist MD;  [facility];  Service: Cardiovascular;  Laterality: N/A;   RIGHT HEART CATH N/A 05/14/2018   Procedure: RIGHT HEART CATH;  Surgeon: BJolaine Artist MD;  [facility];  Service: Cardiovascular;  Laterality: N/A;   RIGHT HEART CATH N/A 09/29/2021  Procedure: RIGHT HEART CATH;  Surgeon: Jolaine Artist, MD;  Location: [facility];  Service: Cardiovascular;  Laterality: N/A;   TEE WITHOUT CARDIOVERSION N/A 05/21/2018   Procedure: TRANSESOPHAGEAL ECHOCARDIOGRAM (TEE);  Surgeon: Jolaine Artist, MD;  Location: [facility];  Service: Cardiovascular;  Laterality: N/A;   UPPER GASTROINTESTINAL ENDOSCOPY  07/12/2017   Patchy areas of mucosal inflammation noted in the antrum with edema,erthema and ulcerations. Bx. Chronicfocally active gastritis.   VASECTOMY      Family History  Problem Relation Age of Onset   Asthma Mother    Arthritis Mother    Heart attack Father    Hypertension Father    Stroke Paternal Grandmother    Colon cancer Neg Hx     Social History   Tobacco Use   Smoking status: Former    Packs/day: 2.00    Years: 34.00    Pack years: 68.00    Types: Cigarettes    Quit date: 07/16/2000    Years since quitting: 21.2   Smokeless tobacco: Never  Vaping Use   Vaping Use: Never used  Substance Use Topics   Alcohol use: Not Currently   Drug use: No    Current Outpatient Medications  Medication Sig Dispense Refill   acetaminophen (TYLENOL) 500 MG tablet Take 500-1,000 mg by mouth every 6 (six) hours as needed (pain.).     albuterol (VENTOLIN HFA) 108 (90 Base) MCG/ACT inhaler Inhale 2 puffs into the lungs every 6 (six)  hours as needed for wheezing or shortness of breath. 8 g 6   ammonium lactate (LAC-HYDRIN) 12 % lotion Apply 1 application topically as needed for dry skin.     Ascorbic Acid (VITAMIN C GUMMIE PO) Take 2 tablets by mouth daily at 12 noon.     aspirin EC 81 MG tablet Take 81 mg by mouth in the morning.     busPIRone (BUSPAR) 7.5 MG tablet Take 1 tablet (7.5 mg total) by mouth 2 (two) times daily. 60 tablet 0   Cholecalciferol (VITAMIN D3) 50 MCG (2000 UT) TABS Take 2,000 Units by mouth daily at 12 noon.     Coenzyme Q10-Vitamin E (QUNOL ULTRA COQ10 PO) Take 1 capsule by mouth in the morning.     famotidine-calcium carbonate-magnesium hydroxide (PEPCID COMPLETE) 10-800-165 MG chewable tablet Chew 2 tablets by mouth daily as needed (indigestion/heartburn).     Ferrous Sulfate 50 MG TBCR Take 1 tablet by mouth every 3 (three) days. Nature's Way Ultimate Iron Maximum Absorption Synergistic Blend     HEMP OIL-VANILLYL BUTYL ETHER EX Apply 1 application topically as needed (dry skin). Hemp Oil Lotion     Homeopathic Products (STRESS/EXHAUSTION RELIEF SL) Take 2 tablets by mouth every other day. Nature Made Stress Relief Gummies Mixed Berry     KRILL OIL PO Take 800 mg by mouth every other day. In the morning     Magnesium 200 MG CHEW Chew 400 mg by mouth daily at 12 noon.     metolazone (ZAROXOLYN) 2.5 MG tablet TAKE 1 TABLET(2.5 MG) BY MOUTH DAILY AS NEEDED FOR FLUID RETENTION 20 tablet 0   midodrine (PROAMATINE) 5 MG tablet Take 3 tablets (15 mg total) by mouth 3 (three) times daily with meals. 90 tablet 3   OPSUMIT 10 MG tablet TAKE 1 TABLET DAILY 30 tablet 11   [START ON 10/06/2021] potassium chloride SA (KLOR-CON) 10 MEQ tablet Take 4 tablets (40 mEq total) by mouth 2 (two) times daily. Please dispense 10 meq tablets (  take 40 meq bid) 240 tablet 3   rosuvastatin (CRESTOR) 5 MG tablet TAKE 1 TABLET(5 MG) BY MOUTH DAILY 90 tablet 3   Selexipag (UPTRAVI) 200 MCG TABS Take 200 mcg by mouth every evening.      Selexipag (UPTRAVI) 400 MCG TABS Take 400 mcg by mouth in the morning.     spironolactone (ALDACTONE) 25 MG tablet Take 1 tablet (25 mg total) by mouth daily. 30 tablet 3   tadalafil, PAH, (ADCIRCA) 20 MG tablet Take 40 mg by mouth in the morning.     TART CHERRY PO Take 2 tablets by mouth daily at 12 noon. HumanN Tart Cherry Gummy by [employer]     [START ON 10/06/2021] torsemide (DEMADEX) 20 MG tablet Take 2 tablets (40 mg total) by mouth 2 (two) times daily. 120 tablet 6   Turmeric 500 MG TABS Take 1 capsule by mouth daily in the afternoon. With Ginger 50 mg     vitamin B-12 (CYANOCOBALAMIN) 1000 MCG tablet Take 1,000 mcg by mouth daily at 12 noon.     warfarin (COUMADIN) 5 MG tablet TAKE 1/2 DAILY DAILY AS DIRECTED BY COUMADIN CLINIC 30 tablet 5   Zinc 50 MG TABS Take 50 mg by mouth every other day. In the afternoon     No current facility-administered medications for this visit.    Allergies  Allergen Reactions   Pantoprazole Sodium Nausea Only    Gets gassy and starting itching like crazy Gets gassy and starting itching like crazy   Valium [Diazepam] Other (See Comments)    HA and Abd pain.   Tape Rash and Other (See Comments)    Surgical tape   Wound Dressing Adhesive Other (See Comments) and Rash    Surgical tape Surgical tape Surgical tape    Review of Systems:  Has easy bruisability Multiple skin bruises.    Physical Exam:    Pulse 75   Ht _0  (1.753 m)   Wt 142 lb (64.4 kg)   SpO2 98%   BMI 20.97 kg/m  Filed Weights   10/05/21 1610  Weight: 142 lb (64.4 kg)   Gen: awake, alert, NAD HEENT: anicteric, no pallor CV: RRR, no mrg Pulm: CTA b/l Abd: soft, NT/ND, +BS throughout Ext: no c/c/e Neuro: nonfocal. Mutiple bruises   Data Reviewed: I have personally reviewed following labs and imaging studies  CBC: CBC Latest Ref Rng & Units 10/04/2021 10/03/2021 10/02/2021  WBC 4.0 - 10.5 K/uL 4.4 4.3 4.9  Hemoglobin 13.0 - 17.0 g/dL 10.2(L) 11.3(L)  10.6(L)  Hematocrit 39.0 - 52.0 % 32.9(L) 36.2(L) 32.9(L)  Platelets 150 - 400 K/uL 204 217 217    CMP: CMP Latest Ref Rng & Units 10/04/2021 10/03/2021 10/02/2021  Glucose 70 - 99 mg/dL 118(H) 94 116(H)  BUN 8 - 23 mg/dL 41(H) 36(H) 36(H)  Creatinine 0.61 - 1.24 mg/dL 2.05(H) 1.75(H) 1.85(H)  Sodium 135 - 145 mmol/L 127(L) 134(L) 130(L)  Potassium 3.5 - 5.1 mmol/L 3.4(L) 4.6 4.2  Chloride 98 - 111 mmol/L 94(L) 97(L) 93(L)  CO2 22 - 32 mmol/L _1 Calcium 8.9 - 10.3 mg/dL 9.3 9.9 9.8  Total Protein 6.5 - 8.1 g/dL - - -  Total Bilirubin 0.3 - 1.2 mg/dL - - -  Alkaline Phos 38 - 126 U/L - - -  AST 15 - 41 U/L - - -  ALT 0 - 44 U/L - - -   Hepatic Function Latest Ref Rng & Units 09/29/2021 08/16/2021 07/04/2021  Total Protein 6.5 - 8.1 g/dL 7.6 8.2 8.6(H)  Albumin 3.5 - 5.0 g/dL 3.5 4.3 4.3  AST 15 - 41 U/L 36 28 33  ALT 0 - 44 U/L _0 Alk Phosphatase 38 - 126 U/L 60 65 83  Total Bilirubin 0.3 - 1.2 mg/dL 2.2(H) 2.0(H) 1.9(H)  Bilirubin, Direct 0.0 - 0.2 mg/dL 0.7(H) - -       Radiology Studies:  Ct Chest High Resolution  Result Date: 05/16/2018 IMPRESSION: 1. Slightly irregular 0.9 cm peripheral right middle lobe solid pulmonary nodule. 2. minimal associated traction bronchiolectasis. 3. Mild cardiomegaly. Minimal interlobular septal thickening and trace dependent bilateral pleural effusions. These findings suggest a mild component of congestive heart failure. 4. Small to moderate volume perihepatic ascites. 5. Ectatic 4.3 cm ascending thoracic aorta. 6. Left main and 3 vessel coronary atherosclerosis. Aortic Atherosclerosis (ICD10-I70.0) and Emphysema (ICD10-J43.9). Electronically Signed   By: Ilona Sorrel M.D.   On: 05/16/2018 16:11    US Liver Doppler:  Result Date: 05/16/2018 CLINICAL DATA:  Hepatic cirrhosis. EXAM: DUPLEX ULTRASOUND OF LIVER TECHNIQUE: Color and duplex Doppler ultrasound was performed to evaluate the hepatic in-flow and out-flow vessels.  COMPARISON:  None. FINDINGS: Portal Vein Velocities Main:  26 cm/sec Right:  14 cm/sec Left:  18 cm/sec Normal hepatopetal flow is noted in the portal veins. Hepatic Vein Velocities Right:  40 cm/sec Middle:  30 cm/sec Left:  32 cm/sec Normal hepatofugal flow is noted in the hepatic veins. Hepatic Artery Velocity:  65 cm/sec Splenic Vein Velocity:  33.4 cm/sec Varices: Absent. Ascites: Present. Superior mesenteric vein velocity: 30 centimeters/second IMPRESSION: No Doppler evidence of hepatic, splenic or portal venous thrombosis or occlusion. Mild ascites is noted. Electronically Signed   By: Marijo Conception, M.D.   On: 05/16/2018 13:47   US Abdomen Limited Ruq:  Result Date: 05/16/2018 CLINICAL DATA:  Hepatic cirrhosis. EXAM: ULTRASOUND ABDOMEN LIMITED RIGHT UPPER QUADRANT COMPARISON:  None. FINDINGS: Gallbladder: Status post cholecystectomy. Common bile duct: Diameter: 3.5 mm which is within normal limits. Liver: No focal lesion identified. Mildly heterogeneous echotexture of hepatic parenchyma is noted with nodular hepatic contours suggesting cirrhosis. Mild ascites is noted. Portal vein is patent on color Doppler imaging with normal direction of blood flow towards the liver. IMPRESSION: Status post cholecystectomy. Findings consistent with hepatic cirrhosis. Mild ascites is noted around the liver. No focal abnormality is noted. Electronically Signed   By: Marijo Conception, M.D.   On: 05/16/2018 13:32    This service was provided via telemedicine.  The patient was located at home.  The provider was located in office.  The patient did consent to this telephone visit and is aware of possible charges through their insurance for this visit.   Time spent on call: 25 min     Carmell Austria, MD 10/05/2021, 4:12 PM  Cc: [name], PA-C

## 2021-10-06 ENCOUNTER — Encounter (HOSPITAL_COMMUNITY): Payer: Medicare Other | Admitting: Internal Medicine

## 2021-10-10 ENCOUNTER — Other Ambulatory Visit: Payer: Self-pay

## 2021-10-10 ENCOUNTER — Telehealth (HOSPITAL_COMMUNITY): Payer: Self-pay | Admitting: *Deleted

## 2021-10-10 NOTE — Telephone Encounter (Signed)
Pt left vm requesting Dr.Bensimhon read Dr.Gupta's last office note and left pt know if he is cleared for colonoscopy.  Routed to [place]

## 2021-10-11 ENCOUNTER — Ambulatory Visit (INDEPENDENT_AMBULATORY_CARE_PROVIDER_SITE_OTHER): Payer: Medicare Other | Admitting: Medical

## 2021-10-11 ENCOUNTER — Encounter (HOSPITAL_COMMUNITY): Payer: Medicare Other

## 2021-10-11 VITALS — BP 114/74 | HR 65 | Temp 97.9°F | Resp 18 | Ht 69.0 in | Wt 143.2 lb

## 2021-10-11 DIAGNOSIS — D649 Anemia, unspecified: Secondary | ICD-10-CM | POA: Diagnosis not present

## 2021-10-11 DIAGNOSIS — I509 Heart failure, unspecified: Secondary | ICD-10-CM | POA: Diagnosis not present

## 2021-10-11 DIAGNOSIS — J9611 Chronic respiratory failure with hypoxia: Secondary | ICD-10-CM

## 2021-10-11 DIAGNOSIS — E538 Deficiency of other specified B group vitamins: Secondary | ICD-10-CM | POA: Diagnosis not present

## 2021-10-11 DIAGNOSIS — R748 Abnormal levels of other serum enzymes: Secondary | ICD-10-CM

## 2021-10-11 DIAGNOSIS — I25119 Atherosclerotic heart disease of native coronary artery with unspecified angina pectoris: Secondary | ICD-10-CM | POA: Diagnosis not present

## 2021-10-11 DIAGNOSIS — G629 Polyneuropathy, unspecified: Secondary | ICD-10-CM | POA: Diagnosis not present

## 2021-10-11 DIAGNOSIS — E611 Iron deficiency: Secondary | ICD-10-CM

## 2021-10-11 NOTE — Patient Instructions (Addendum)
Acute on chronic diastolic HF -continue spirinollactone and torsemide as directed by Dr. Haroldine Laws.    Pulmonary hypertension with cor pulmonale/RV failure On triple therapy with Macitentan and Adcirca. Uptravi dose at 400/200 limited by cramping.  Numbers not c/w need for IV therapies  Chronic hypoxic respiratory failure - [place] CT 05/16/18 with "dependent basilar predominant patchy subpleural reticulation and ground-glass attenuation with the suggestion of minimal associated traction bronchiolectasis. No frank honeycombing. Findings may indicate an interstitial lung disease such as early usual interstitial pneumonia (UIP) or fibrotic phase nonspecific interstitial pneumonia (NSIP)." - Followed by Dr. Lamonte Sakai.   Chronic AFL/pauses - Rate controlled, rate 50s-60s.  Now off metoprolol.  - Multiple pauses 2-3sc on 10/6. No further pauses noted. Keep off beta blocker.  - Continue coumadin with mechanical AVR.  CAD - s/p CABG 07/2017 with Dr Roderic Palau in [place].  - No s/s ischemia - Continue Crestor 5 mg daily (unable to tolerate atorva due to myalgias).  - Lipids followed by Dr. Bettina Gavia.   S/p mechanical AVR - Stable on echo 4/22. - Off heparin.  - On coumadin. INR 2.7.    For anemia get cbc and iron level.  For neuropathy and b12 deficiency get b12 and b1 level.  For cellulitis rx keflex twice daily.   For low k and low na check cmp.  Follow up date to be determined after lab review.

## 2021-10-11 NOTE — Progress Notes (Signed)
Subjective:    Patient ID: AQEEL NORGAARD, male    DOB: 09/18/1946, 75 y.o.   MRN: [id number]  HPI Pt in for follow up.  Pt has history of anemia.. Cause undetermined. I had referred to Dr. Lyndel Safe.   #1. IDA with H+ stools   #2. GERD (s/p EGD 06/2017 - at [facility], [place] -report reviewed, mild gastritis with negative biopsies, no varices). Had allergic reaction to protonix. Now with bloating.   #2.  Liver Cirrhosis (likely cardiac liver cirrhosis with congestive hepatomegaly). No ETOH. No evidence of portal hypertension.  Mild ascites likely d/t right heart failure with cor pulmonale and pulmonary HTN. Neg [place] with doppler 04/2018. Nl AFP 3.2 04/2019. Alb 4.1 05/2019. [place] 06/06/2019-congestive hepatomegaly, prominent hepatic veins and IVC d/t elevated right heart pressures.   #3. Rectal bleeding with constipation d/t internal hemorrhoids. Neg hemoccult. Neg colon in [place] (resolved).   #4  Anemia of chronic disease.  No overt GI bleeding.   #5. Co-morbid conditions include COPD,RV failure with pulmonary HTN/cor pulmonale, Afib with Nl EF 55% (07/2019), CAD s/p CABG, mechanical AVR on Coumadin, chronic respiratory failure, HLD, HTN.  Leuko-classic vasculitis.   #6.  RUQ abdominal pain likely d/t congestive hepatomegaly.       Plan: - colace 1 tab po qd as he has done already. - Continue Aciphex 64m po qd #30, 11 refills. - Ideally, EGD/colon only after cardiolgy clearence, [facility]  - Continue fluid and salt restricted diet as he has been doing. - Continue torsemide and spironolactone as per cardiology.   - Continue Midodrine 10 mg every morning, afternoon and 15 mg nightly.   Last cbc showed hb/hct. 10.2/hct 32.9.  Pt thinks he has cellulitis and neuropathy.    Recent hospital dc date 10/03/2021.   Discharge Summary    Patient ID: MTRAVARES NELLESMRN: [id number] DOB/AGE: [birth year], [birth year]47y.o. Admit date: 09/29/2021 D/C date:     10/04/2021     Primary Discharge Diagnoses:  1. Acute on chronic diastolic HF 2. Pulmonary hypertension with cor pulmonale/RV failure 3. Chronic hypoxic respiratory failure 4. Chronic AFL/pauses 5. Frequent PVCs 6 CAD 7. S/p mechanical AVR 8. Leukocytoclastic vasculitis 9. Cirrhosis 10. Hyponatremia 11. 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   [place] [place], PFTs and VQ (low prob). Felt to have Golds I COPD with asthma overlap and early pulmonary fibrosis with PAH. Echo 8/18  normal LV function with septal flattening and RVSP 65-762mG. 14-day event monitor showed 10-beat run NSVT. Felt to have chronic AF and PAH.    Underwent RHC on 01/18/18 with severe PAH and cor pulmonale. Macitentan and Adcirca. Subsequently started on selexipag but dose limited by cramping now on 400/200   Admitted 5/19 with symptomatic hypotension and weight gain. He had a repeat RHC and echo with bubble study (results below). He was started on midodrine for BP support. Diuresed 25 lbs with IV lasix, then transitioned to torsemide 60 mg daily. Abdominal USKoreahowed cirrhosis with no evidence of portal HTN. Pulm consulted for possible ILD on High-res chest CT.   Acute visit 08/02/21 feeling poorly, NYHA  III symptoms, volume ok. Suspected worsening anxiety driving symptoms.     Seen 09/12/21 with NYHA IIIb-IV symptoms. ReDs 38%. Given 80 m g IV lasix + metolazone 2.5 in clinic. Then arranged for 80 mg IV lasix x 2 days with [facility]. He was seen during his lab appt and he was some better but still had LE edema so we did one more dose of 80 mg IV lasix with [facility], last dose given 09/21/21.   Admitted from the cath lab on 10/6  with elevated filling pressures. RHC with moderate PAH and elevated filling pressures. Started on IV lasix then transitioned to torsemide. Torsemide held due to creatinine bump. He will restart torsemide and potassium 10/06/21.    PAH medications continued. INR followed closely by HF Pharmacy with adjustments. Tomorrow he take 2.5 mg coumadin until INR checked next week. He will continue to be followed closely in the HF clinic.     1. Acute on chronic diastolic HF - not responding well to outpatient management. RHC with elevated filling pressures.  - admitted 10/06 for IV diuresis. Transitioned to torsemide on 10/02/21.  -  Creatinine bumped on the day of discharged so torsemide  will be held 10/11 and 10/12.   - Consider SGLT2i at f/u if scr remains stable. - Continue spiro at 25 daily   2. Pulmonary hypertension with cor pulmonale/RV failure - WHO Group I & II - Auto-immune serologies negative (checked twice). - ? Component of HHT/shunt/AVMs with late bubbles on bubble study.  - Echo with bubble study (05/16/18): EF 55-60% with "late bubbles" , Mechanical AVR stable with trivial perivalvular regurg, Mild MR, Severe LAE, Severe RV dilation and reduced function. No PFO. Mod TR, PA peak pressure 68 mm Hg - Echo (4/22): EF 50-55%, RV ok, severe RVSP 61.6 mmHg, severe biatrial enlargement, moderate TR, stable AVR, AV mean gradient 11 mmHg - Echo 10/22 EF 45-50 RV low normal  - RHC this admit PVR 3.1  - Ab u/s with cirrhosis but no evidence of portal HTN. - On triple therapy with Macitentan and Adcirca. Uptravi dose at 400/200 limited by cramping.  Numbers not c/w need for IV therapies   3. Chronic hypoxic respiratory failure - High [place] CT 05/16/18 with "dependent basilar predominant patchy subpleural reticulation and ground-glass attenuation with the suggestion of minimal associated traction bronchiolectasis. No frank honeycombing. Findings may indicate an interstitial lung disease such as early  usual interstitial pneumonia (UIP) or fibrotic phase nonspecific interstitial pneumonia (NSIP)." - Followed by Dr. Lamonte Sakai. - Stable on room air.    4. Chronic AFL/pauses - Rate controlled, rate 50s-60s.  Now off metoprolol.  - Multiple pauses 2-3sc on 10/6. No further pauses noted. Keep off beta blocker.  - Continue coumadin with mechanical AVR.   5. Frequent PVCs - this may be contributing to HF - repeat echo stable  -Rare PVCs    6 CAD - s/p CABG 07/2017 with Dr Roderic Palau in [place].  - No s/s ischemia - Continue Crestor 5 mg daily (unable to tolerate atorva due to myalgias).  - Lipids followed by Dr. Bettina Gavia.   7. S/p mechanical AVR - Stable on echo 4/22. - Off heparin.  - On coumadin. INR 2.7.     8. Leukocytoclastic vasculitis - F/u with Rheumatology.  - No changes.   9. Cirrhosis - US Abdomen RUQ 05/16/18 - s/p cholecystectomy. + hepatic cirrhosis. Mild ascites.  - Liver Doppler 05/16/18 - No hepatic, splenic, or portal venous thrombosis or  occlusion. Mild ascites. - Continue midodrine.    10. Hyponatremia -Improved to 134 after 30 mg tolvaptan on 10/9  - Todays sodium 127.  - continue free water restrict   11. Hypokalemia - Resolved. K supplemented as needed.    Discharge Weight: 134 pounds.    Discharge Vitals: Blood pressure 110/65, pulse 68, temperature 98.2 F (36.8 C), resp. rate 16, height _0  (1.753 m), weight 61 kg, SpO2 97 %.   Review of Systems  Constitutional:  Negative for chills, fatigue and fever.  HENT:  Negative for congestion, drooling and ear pain.   Respiratory:  Negative for cough, chest tightness, shortness of breath and wheezing.   Cardiovascular:  Negative for chest pain and palpitations.  Gastrointestinal:  Negative for abdominal distention, abdominal pain, anal bleeding, blood in stool and constipation.  Genitourinary:  Negative for difficulty urinating, enuresis and flank pain.  Musculoskeletal:  Negative for back pain, joint swelling and  neck pain.  Skin:  Negative for rash.  Neurological:  Negative for facial asymmetry, speech difficulty, weakness, light-headedness and numbness.  Hematological:  Negative for adenopathy. Does not bruise/bleed easily.  Psychiatric/Behavioral:  Negative for behavioral problems, confusion, decreased concentration and hallucinations. The patient is not nervous/anxious.     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 In light of concurrent left nephrolithiasis, will check CT renal colic next year instead of renal US.    Anticoagulated on Coumadin 01/04/2018   Anxiety 05/10/2016   Last Assessment & Plan:  Doing well off of zoloft.   Asthma    Asymptomatic microscopic hematuria 06/20/2017   Last Assessment & Plan:  Had hematuria workup in [facility] in [facility] which negative CT and cystoscopy. UA with 2+ blood last visit - we discussed recommendation for repeat workup at 5 years or if degree of hematuria progresses.    Atrial fibrillation ([place]) 03/29/2017   Atrial flutter ([place]) 03/29/2017   Backache 12/14/2015   Last Assessment & Plan:  Pain management referral for further evaluation.   Bilateral pleural effusion 08/09/2017   CHF (congestive heart failure) (HCC)    Chronic allergic rhinitis 04/28/2016   Last Assessment & Plan:  Continue astelin   Chronic anticoagulation 03/29/2017   Chronic atrial fibrillation ([place]) 07/23/2015   Last Assessment & Plan:  Coumadin and metoprolol, cardiology referral to establish care.   Chronic midline back pain 12/14/2015   Last Assessment & Plan:  Pain management referral for further evaluation.   Chronic obstructive lung disease ([place]) 06/12/2016   With hypoxia   Chronic prostatitis 07/23/2015   Last Assessment & Plan:  Has largely resolved since stopping bike riding. Recommend annual DRE AND  PSA - will see back 12/2015 for annual screening, given 1st degree fhx. To call office for recurrent prostatitis symptoms.    Chronic respiratory failure with hypoxia ([place]) 07/29/2019   Cirrhosis of liver not due to alcohol ([place]) 07/01/2019   COPD (chronic obstructive pulmonary disease) ([place]) 06/12/2016   Coronary arteriosclerosis in native artery 03/29/2017   Coronary artery disease involving native coronary artery of native heart with angina pectoris ([place]) 03/29/2017   Cough 10/17/2016   Last Assessment & Plan:  Discussed typical course for acute viral illness. If symptoms worsen or fail to improve by 7-10d, delayed ATBs, fluids, rest, NSAIDs/APAP prn.  Seek care if not improving. Needs earlier INR check due to ATBs.   Dyspnea 02/01/2016   Last Assessment & Plan:  Overall improving, eval by pulm, plan for CT, neg stress test with cardiology. Recent switch to carvedilol due to side effects.   Encounter for therapeutic drug monitoring 01/06/2019   Enterococcal bacteremia    Epidermoid cyst of skin 08/24/2017   Essential hypertension 12/14/2015   Last Assessment & Plan:  Hypertension control: controlled  Medications: compliant Medication Management: as noted in orders Home blood pressure monitoring recommended additionally as needed for symptoms  The patient's care plan was reviewed and updated. Instructions and counseling were provided regarding patient goals and barriers. He was counseled to adopt a healthy lifestyle. Educational resources and self-management tools have been provided as charted in [facility] list.    H/O maze procedure 03/29/2017   [name] 03/29/2017   Overview:  2011   History of coronary artery bypass graft 03/29/2017   Hx of CABG 03/29/2017   Hyperlipidemia 03/29/2017   Hypersensitivity angiitis ([place]) 10/01/2017   Hypertensive heart disease 03/29/2017   Hypertensive heart disease with heart failure ([place]) 03/29/2017   Hypertensive heart failure ([place]) 03/29/2017   Hypokalemia due  to excessive renal loss of potassium 02/18/2018   Hypotension, chronic 06/06/2018   International normalized ratio (INR) raised 07/26/2017   Kidney stone 07/23/2015   Kidney stones 07/23/2015   Overview:  x 3  Last Assessment & Plan:  By [place] has left nephrolithiasis, but not visible by KUB. Will check CT renal colic next year to assess both stone burden as well as to surveil AML.    Left ureteral stone 01/23/2018   Leukocytoclastic vasculitis ([place]) 10/01/2017   Localized edema 01/04/2018   Long term (current) use of anticoagulants 03/29/2017   Lumbar radicular pain 01/19/2016   Lumbar radiculopathy 01/19/2016   Maculopapular rash 09/03/2017   Microscopic hematuria 06/20/2017   Last Assessment & Plan:  Had hematuria workup in [facility] in [facility] which negative CT and cystoscopy. UA with 2+ blood last visit - we discussed recommendation for repeat workup at 5 years or if degree of hematuria progresses.    Multiple nodules of lung 06/12/2016   Nasal discharge 02/25/2016   Last Assessment & Plan:  Trial zyrtec and flonase   Nephrolithiasis 07/23/2015   Overview:  x 3  Last Assessment & Plan:  By [place] has left nephrolithiasis, but not visible by KUB. Will check CT renal colic next year to assess both stone burden as well as to surveil AML.   Overview:  x 3  Last Assessment & Plan:  Has 28m nonobstructing LUP stone - not visible by KUB.  Will check renal [place]8/2019 - he will contact office sooner if symptomatic.    Non-sustained ventricular tachycardia 03/29/2017   Nonsustained ventricular tachycardia 03/29/2017   Other hyperlipidemia 03/29/2017   Palpitations 10/01/2017   Pleural effusion, bilateral 08/09/2017   Pneumonia due to COVID-19 virus 02/07/2021   Post-nasal drainage 02/25/2016   Last Assessment & Plan:  Trial zyrtec and flonase   Prostate cancer screening 06/20/2017   Last Assessment & Plan:  Recommend continued annual CaP screening until within 10 years of life expectancy. Given good health and fhx of longevity, would  anticipate CaP screening to continue until age 75  PSA today and again in one year on day of visit.   Pulmonary arterial hypertension ([place] 02/20/2018   Pulmonary edema    Pulmonary hypertension ([place] 08/09/2017  Pulmonary nodules 06/12/2016   S/P AVR 03/20/2016   S/P AVR (aortic valve replacement) 03/20/2016   Strain of deltoid muscle, initial encounter    Supratherapeutic INR 07/26/2017   Syncope and collapse 02/01/2016   Typical atrial flutter ([place]) 02/01/2016     Social History   Socioeconomic History   Marital status: Single    Spouse name: Not on file   Number of children: Not on file   Years of education: Not on file   Highest education level: Not on file  Occupational History   Not on file  Tobacco Use   Smoking status: Former    Packs/day: 2.00    Years: 34.00    Pack years: 68.00    Types: Cigarettes    Quit date: 07/16/2000    Years since quitting: 21.2   Smokeless tobacco: Never  Vaping Use   Vaping Use: Never used  Substance and Sexual Activity   Alcohol use: Not Currently   Drug use: No   Sexual activity: Not on file  Other Topics Concern   Not on file  Social History Narrative   Not on file   Social Determinants of Health   Financial Resource Strain: Low Risk    Difficulty of Paying Living Expenses: Not very hard  Food Insecurity: No Food Insecurity   Worried About Running Out of Food in the Last Year: Never true   Ran Out of Food in the Last Year: Never true  Transportation Needs: No Transportation Needs   Lack of Transportation (Medical): No   Lack of Transportation (Non-Medical): No  Physical Activity: Not on file  Stress: Not on file  Social Connections: Not on file  Intimate Partner Violence: Not on file    Past Surgical History:  Procedure Laterality Date   CHOLECYSTECTOMY     CORONARY ARTERY BYPASS GRAFT     EXPLORATORY LAPAROTOMY  07/30/2017   FOOT SURGERY     FRACTURE SURGERY Right    wrist and forearm   HERNIA REPAIR     MECHANICAL  AORTIC VALVE REPLACEMENT     NASAL SINUS SURGERY     RIGHT HEART CATH N/A 01/18/2018   Procedure: RIGHT HEART CATH;  Surgeon: Jolaine Artist, MD;  Location: [place] [facility];  Service: Cardiovascular;  Laterality: N/A;   RIGHT HEART CATH N/A 05/14/2018   Procedure: RIGHT HEART CATH;  Surgeon: Jolaine Artist, MD;  Location: [place] [facility];  Service: Cardiovascular;  Laterality: N/A;   RIGHT HEART CATH N/A 09/29/2021   Procedure: RIGHT HEART CATH;  Surgeon: Jolaine Artist, MD;  Location: [place] [facility];  Service: Cardiovascular;  Laterality: N/A;   TEE WITHOUT CARDIOVERSION N/A 05/21/2018   Procedure: TRANSESOPHAGEAL ECHOCARDIOGRAM (TEE);  Surgeon: Jolaine Artist, MD;  Location: [facility] ENDOSCOPY;  Service: Cardiovascular;  Laterality: N/A;   UPPER GASTROINTESTINAL ENDOSCOPY  07/12/2017   Patchy areas of mucosal inflammation noted in the antrum with edema,erthema and ulcerations. Bx. Chronicfocally active gastritis.   VASECTOMY      Family History  Problem Relation Age of Onset   Asthma Mother    Arthritis Mother    Heart attack Father    Hypertension Father    Stroke Paternal Grandmother    Colon cancer Neg Hx     Allergies  Allergen Reactions   Pantoprazole Sodium Nausea Only    Gets gassy and starting itching like crazy Gets gassy and starting itching like crazy   Valium [Diazepam] Other (See Comments)  HA and Abd pain.   Tape Rash and Other (See Comments)    Surgical tape   Wound Dressing Adhesive Other (See Comments) and Rash    Surgical tape Surgical tape Surgical tape    Current Outpatient Medications on File Prior to Visit  Medication Sig Dispense Refill   acetaminophen (TYLENOL) 500 MG tablet Take 500-1,000 mg by mouth every 6 (six) hours as needed (pain.).     albuterol (VENTOLIN HFA) 108 (90 Base) MCG/ACT inhaler Inhale 2 puffs into the lungs every 6 (six) hours as needed for wheezing or shortness of breath. 8 g 6   ammonium lactate  (LAC-HYDRIN) 12 % lotion Apply 1 application topically as needed for dry skin.     Ascorbic Acid (VITAMIN C GUMMIE PO) Take 2 tablets by mouth daily at 12 noon.     aspirin EC 81 MG tablet Take 81 mg by mouth in the morning.     busPIRone (BUSPAR) 7.5 MG tablet Take 1 tablet (7.5 mg total) by mouth 2 (two) times daily. 60 tablet 0   Cholecalciferol (VITAMIN D3) 50 MCG (2000 UT) TABS Take 2,000 Units by mouth daily at 12 noon.     Coenzyme Q10-Vitamin E (QUNOL ULTRA COQ10 PO) Take 1 capsule by mouth in the morning.     famotidine-calcium carbonate-magnesium hydroxide (PEPCID COMPLETE) 10-800-165 MG chewable tablet Chew 2 tablets by mouth daily as needed (indigestion/heartburn).     HEMP OIL-VANILLYL BUTYL ETHER EX Apply 1 application topically as needed (dry skin). Hemp Oil Lotion     Homeopathic Products (STRESS/EXHAUSTION RELIEF SL) Take 2 tablets by mouth every other day. Nature Made Stress Relief Gummies Mixed Berry     KRILL OIL PO Take 800 mg by mouth every other day. In the morning     Magnesium 200 MG CHEW Chew 400 mg by mouth daily at 12 noon.     metolazone (ZAROXOLYN) 2.5 MG tablet TAKE 1 TABLET(2.5 MG) BY MOUTH DAILY AS NEEDED FOR FLUID RETENTION 20 tablet 0   midodrine (PROAMATINE) 5 MG tablet Take 3 tablets (15 mg total) by mouth 3 (three) times daily with meals. 90 tablet 3   OPSUMIT 10 MG tablet TAKE 1 TABLET DAILY 30 tablet 11   potassium chloride SA (KLOR-CON) 10 MEQ tablet Take 4 tablets (40 mEq total) by mouth 2 (two) times daily. Please dispense 10 meq tablets (take 40 meq bid) 240 tablet 3   RABEprazole (ACIPHEX) 20 MG tablet Take 1 tablet (20 mg total) by mouth daily. 90 tablet 4   rosuvastatin (CRESTOR) 5 MG tablet TAKE 1 TABLET(5 MG) BY MOUTH DAILY 90 tablet 3   Selexipag (UPTRAVI) 200 MCG TABS Take 200 mcg by mouth every evening.     Selexipag (UPTRAVI) 400 MCG TABS Take 400 mcg by mouth in the morning.     spironolactone (ALDACTONE) 25 MG tablet Take 1 tablet (25 mg  total) by mouth daily. 30 tablet 3   tadalafil, PAH, (ADCIRCA) 20 MG tablet Take 40 mg by mouth in the morning.     TART CHERRY PO Take 2 tablets by mouth daily at 12 noon. HumanN Tart Cherry Gummy by [employer]     torsemide (DEMADEX) 20 MG tablet Take 2 tablets (40 mg total) by mouth 2 (two) times daily. 120 tablet 6   Turmeric 500 MG TABS Take 1 capsule by mouth daily in the afternoon. With Ginger 50 mg     vitamin B-12 (CYANOCOBALAMIN) 1000 MCG tablet Take 1,000 mcg by mouth  daily at 12 noon.     warfarin (COUMADIN) 5 MG tablet TAKE 1/2 DAILY DAILY AS DIRECTED BY COUMADIN CLINIC 30 tablet 5   Zinc 50 MG TABS Take 50 mg by mouth every other day. In the afternoon     No current facility-administered medications on file prior to visit.    BP 114/74   Pulse 65   Temp 97.9 F (36.6 C)   Resp 18   Ht _0  (1.753 m)   Wt 143 lb 3.2 oz (65 kg)   SpO2 98%   BMI 21.15 kg/m        Objective:   Physical Exam  General Mental Status- Alert. General Appearance- Not in acute distress.   Skin General: Color- Normal Color. Moisture- Normal Moisture.  Neck Carotid Arteries- Normal color. Moisture- Normal Moisture. No carotid bruits. No JVD.  Chest and Lung Exam Auscultation: Breath Sounds:-Normal.  Cardiovascular Auscultation:Rythm- Regular. Murmurs & Other Heart Sounds:Auscultation of the heart reveals- No Murmurs.  Abdomen Inspection:-Inspeection Normal. Palpation/Percussion:Note:No mass. Palpation and Percussion of the abdomen reveal- Non Tender, Non Distended + BS, no rebound or guarding.  Neurologic Cranial Nerve exam:- CN III-XII intact(No nystagmus), symmetric smile. Strength:- 5/5 equal and symmetric strength both upper and lower extremities.   Lower ext- mild redness to both pretibial area. Rt side worse. Calf symmetric.    Assessment & Plan:   Patient Instructions  Acute on chronic diastolic HF -continue spirinollactone and torsemide as directed by Dr.  Haroldine Laws.    Pulmonary hypertension with cor pulmonale/RV failure On triple therapy with Macitentan and Adcirca. Uptravi dose at 400/200 limited by cramping.  Numbers not c/w need for IV therapies  Chronic hypoxic respiratory failure - [place] CT 05/16/18 with "dependent basilar predominant patchy subpleural reticulation and ground-glass attenuation with the suggestion of minimal associated traction bronchiolectasis. No frank honeycombing. Findings may indicate an interstitial lung disease such as early usual interstitial pneumonia (UIP) or fibrotic phase nonspecific interstitial pneumonia (NSIP)." - Followed by Dr. Lamonte Sakai.   Chronic AFL/pauses - Rate controlled, rate 50s-60s.  Now off metoprolol.  - Multiple pauses 2-3sc on 10/6. No further pauses noted. Keep off beta blocker.  - Continue coumadin with mechanical AVR.  CAD - s/p CABG 07/2017 with Dr Roderic Palau in [place].  - No s/s ischemia - Continue Crestor 5 mg daily (unable to tolerate atorva due to myalgias).  - Lipids followed by Dr. Bettina Gavia.   S/p mechanical AVR - Stable on echo 4/22. - Off heparin.  - On coumadin. INR 2.7.    For anemia get cbc and iron level.  For neuropathy and b12 deficiency get b12 and b1 level.  For cellulitis rx keflex twice daily.   For low k and low na check cmp.  Follow up date to be determined after lab review.   [name], PA-C

## 2021-10-12 ENCOUNTER — Telehealth: Payer: Self-pay | Admitting: Gastroenterology

## 2021-10-12 LAB — CBC WITH DIFFERENTIAL/PLATELET
Basophils Absolute: 0 10*3/uL (ref 0.0–0.1)
Basophils Relative: 0 % (ref 0.0–3.0)
Eosinophils Absolute: 0 10*3/uL (ref 0.0–0.7)
Eosinophils Relative: 1.1 % (ref 0.0–5.0)
HCT: 33.1 % — ABNORMAL LOW (ref 39.0–52.0)
Hemoglobin: 10.6 g/dL — ABNORMAL LOW (ref 13.0–17.0)
Lymphocytes Relative: 13.2 % (ref 12.0–46.0)
Lymphs Abs: 0.5 10*3/uL — ABNORMAL LOW (ref 0.7–4.0)
MCHC: 32 g/dL (ref 30.0–36.0)
MCV: 77.9 fl — ABNORMAL LOW (ref 78.0–100.0)
Monocytes Absolute: 0.7 10*3/uL (ref 0.1–1.0)
Monocytes Relative: 17.8 % — ABNORMAL HIGH (ref 3.0–12.0)
Neutro Abs: 2.8 10*3/uL (ref 1.4–7.7)
Neutrophils Relative %: 67.9 % (ref 43.0–77.0)
Platelets: 189 10*3/uL (ref 150.0–400.0)
RBC: 4.25 Mil/uL (ref 4.22–5.81)
RDW: 23.4 % — ABNORMAL HIGH (ref 11.5–15.5)
WBC: 4.2 10*3/uL (ref 4.0–10.5)

## 2021-10-12 LAB — VITAMIN B12: Vitamin B-12: >1550 pg/mL — ABNORMAL HIGH (ref 211–911)

## 2021-10-12 LAB — IRON: Iron: 22 ug/dL — ABNORMAL LOW (ref 42–165)

## 2021-10-12 MED ORDER — CEPHALEXIN 500 MG PO CAPS: 500.0000 mg | ORAL_CAPSULE | Freq: Two times a day (BID) | ORAL | 0 refills | Status: DC

## 2021-10-12 NOTE — Telephone Encounter (Signed)
Proceed with EGD/colon at [facility] I do not see actual cardiology clearance and their recommendations regarding anticoagulation. RG

## 2021-10-12 NOTE — Telephone Encounter (Signed)
Pt called and stated he needed a colonoscopy and wants permission from dr. Bettina Gavia. I advised him that once its scheduled we would need a clearance form faxed to the [place] office indicating how many days dr. Lyndel Safe would like them to hold warfarin should they decide to schedule. I also told pt that I would route this to [facility] nurse [place] rn .

## 2021-10-12 NOTE — Addendum Note (Signed)
Addended by: Anabel Halon on: 10/12/2021 02:29 PM   Modules accepted: Orders

## 2021-10-12 NOTE — Telephone Encounter (Signed)
I will keep a look out for this cardiac clearance form.

## 2021-10-12 NOTE — Telephone Encounter (Signed)
Pt states that that Dr. Abundio Miu ( pt Cardiologist)  is wanting [place] to send and order stating how long pt will need to be off warfarin prior to the EGD and Colonoscopy.  Pt was instructed that typically the procedures are scheduled and then we reach out to the cardiologist  to advise on holding medication  and also for Cardiac Clearance. Reviewing pt chart this seems to be what was instructed to pt from the cardiologist office.  Please Advise if you would like this pt scheduled for the EGD and Colon in the [place] or [facility] [place] [place] due to his Medical History.   Please Advise

## 2021-10-12 NOTE — Telephone Encounter (Signed)
Patient called requesting we send an order to Dr. Abundio Miu cardiologist to hold INR fax: [phone]

## 2021-10-13 ENCOUNTER — Other Ambulatory Visit: Payer: Self-pay

## 2021-10-13 ENCOUNTER — Encounter (HOSPITAL_COMMUNITY): Payer: Medicare Other

## 2021-10-13 ENCOUNTER — Telehealth: Payer: Self-pay

## 2021-10-13 DIAGNOSIS — Z1211 Encounter for screening for malignant neoplasm of colon: Secondary | ICD-10-CM

## 2021-10-13 DIAGNOSIS — K219 Gastro-esophageal reflux disease without esophagitis: Secondary | ICD-10-CM

## 2021-10-13 NOTE — Telephone Encounter (Signed)
Routing this message to Dr. Lyndel Safe

## 2021-10-13 NOTE — Telephone Encounter (Signed)
Preoperative team-patient has an appointment scheduled with Dr. Haroldine Laws on 10/18/2021.  I will defer preoperative cardiac evaluation to him at that time due to his recent HF flare and hospitalization.  Would recommend evaluation/call back closer to the time of the procedure.  Please add preoperative cardiac evaluation to upcoming appointment note.  Thank you.  Jossie Ng. Tong Pieczynski NP-C    10/13/2021, 3:56 PM [place] Office [phone] Fax [phone]

## 2021-10-13 NOTE — Telephone Encounter (Signed)
[facility] Pre-operative Risk Assessment     Lance Hicks October 23, 1946 [id number]  Procedure: EGD/Colonoscopy Anesthesia type:  MAC Procedure Date: 12/12/2021 Provider: Dr. Dayna Barker  Type of Clearance needed: Pharmacy/Cardiac/Medical  Medication(s) needing held: Warfarin    Length of time for medication to be held: Please Advise   Please review request and advise by either responding to this message or by sending your response to the fax # provided below.  Thank you,  [facility]  Phone: [phone] Fax: [phone] ATTENTION: [name], LPN

## 2021-10-13 NOTE — Telephone Encounter (Signed)
Pt has been scheduled for an EGD/ Colonoscopy with Dr. Lorenso Courier on December 19th 2022 @ 8:30 at [facility] Case Number [id number] Cardiac Clearance sent for pt to Dr Bernadene Bell at [facility] for cardiac Clearance and Warfarin hold  Pt has been scheduled for a previsit on December 7th at 2:30 Pt made aware of dates and times of appointment and location    Pt states that he is spitting up a little blood tinged blood at times. Pt also states that  sometimes when he blows his nose that he bleeds a little as well.  Pt states that he does not believe that the Pepcid is not working although pt states that he does not have any heartburn.  Please advise

## 2021-10-14 ENCOUNTER — Other Ambulatory Visit: Payer: Self-pay

## 2021-10-14 ENCOUNTER — Encounter (HOSPITAL_COMMUNITY): Payer: Self-pay

## 2021-10-14 ENCOUNTER — Other Ambulatory Visit (HOSPITAL_COMMUNITY): Payer: Self-pay

## 2021-10-14 ENCOUNTER — Ambulatory Visit (HOSPITAL_COMMUNITY)
Admission: RE | Admit: 2021-10-14 | Discharge: 2021-10-14 | Disposition: A | Discharge status: Home / Self Care | Payer: Medicare Other | Source: Ambulatory Visit | Attending: Family Medicine | Admitting: Family Medicine

## 2021-10-14 VITALS — BP 138/70 | HR 76 | Wt 143.2 lb

## 2021-10-14 DIAGNOSIS — I110 Hypertensive heart disease with heart failure: Secondary | ICD-10-CM | POA: Diagnosis not present

## 2021-10-14 DIAGNOSIS — I493 Ventricular premature depolarization: Secondary | ICD-10-CM

## 2021-10-14 DIAGNOSIS — Z888 Allergy status to other drugs, medicaments and biological substances status: Secondary | ICD-10-CM | POA: Diagnosis not present

## 2021-10-14 DIAGNOSIS — Z951 Presence of aortocoronary bypass graft: Secondary | ICD-10-CM | POA: Insufficient documentation

## 2021-10-14 DIAGNOSIS — J9610 Chronic respiratory failure, unspecified whether with hypoxia or hypercapnia: Secondary | ICD-10-CM | POA: Diagnosis not present

## 2021-10-14 DIAGNOSIS — I2510 Atherosclerotic heart disease of native coronary artery without angina pectoris: Secondary | ICD-10-CM | POA: Diagnosis not present

## 2021-10-14 DIAGNOSIS — I4891 Unspecified atrial fibrillation: Secondary | ICD-10-CM | POA: Diagnosis not present

## 2021-10-14 DIAGNOSIS — I25119 Atherosclerotic heart disease of native coronary artery with unspecified angina pectoris: Secondary | ICD-10-CM

## 2021-10-14 DIAGNOSIS — I480 Paroxysmal atrial fibrillation: Secondary | ICD-10-CM | POA: Diagnosis not present

## 2021-10-14 DIAGNOSIS — Z7982 Long term (current) use of aspirin: Secondary | ICD-10-CM | POA: Diagnosis not present

## 2021-10-14 DIAGNOSIS — Z87891 Personal history of nicotine dependence: Secondary | ICD-10-CM | POA: Diagnosis not present

## 2021-10-14 DIAGNOSIS — J432 Centrilobular emphysema: Secondary | ICD-10-CM | POA: Diagnosis not present

## 2021-10-14 DIAGNOSIS — Z8616 Personal history of COVID-19: Secondary | ICD-10-CM | POA: Insufficient documentation

## 2021-10-14 DIAGNOSIS — Z79899 Other long term (current) drug therapy: Secondary | ICD-10-CM | POA: Diagnosis not present

## 2021-10-14 DIAGNOSIS — Z952 Presence of prosthetic heart valve: Secondary | ICD-10-CM | POA: Insufficient documentation

## 2021-10-14 DIAGNOSIS — I5032 Chronic diastolic (congestive) heart failure: Secondary | ICD-10-CM | POA: Insufficient documentation

## 2021-10-14 DIAGNOSIS — R188 Other ascites: Secondary | ICD-10-CM | POA: Insufficient documentation

## 2021-10-14 DIAGNOSIS — K7460 Unspecified cirrhosis of liver: Secondary | ICD-10-CM | POA: Insufficient documentation

## 2021-10-14 DIAGNOSIS — Z7901 Long term (current) use of anticoagulants: Secondary | ICD-10-CM | POA: Diagnosis not present

## 2021-10-14 DIAGNOSIS — E785 Hyperlipidemia, unspecified: Secondary | ICD-10-CM | POA: Diagnosis not present

## 2021-10-14 DIAGNOSIS — K7469 Other cirrhosis of liver: Secondary | ICD-10-CM | POA: Diagnosis not present

## 2021-10-14 DIAGNOSIS — I2721 Secondary pulmonary arterial hypertension: Secondary | ICD-10-CM | POA: Diagnosis not present

## 2021-10-14 DIAGNOSIS — I2729 Other secondary pulmonary hypertension: Secondary | ICD-10-CM | POA: Insufficient documentation

## 2021-10-14 DIAGNOSIS — M310 Hypersensitivity angiitis: Secondary | ICD-10-CM | POA: Diagnosis not present

## 2021-10-14 DIAGNOSIS — I483 Typical atrial flutter: Secondary | ICD-10-CM | POA: Diagnosis not present

## 2021-10-14 LAB — BASIC METABOLIC PANEL
Anion gap: 11 (ref 5–15)
BUN: 34 mg/dL — ABNORMAL HIGH (ref 8–23)
CO2: 25 mmol/L (ref 22–32)
Calcium: 9.6 mg/dL (ref 8.9–10.3)
Chloride: 89 mmol/L — ABNORMAL LOW (ref 98–111)
Creatinine, Ser: 1.88 mg/dL — ABNORMAL HIGH (ref 0.61–1.24)
GFR, Estimated: 37 mL/min — ABNORMAL LOW (ref >60)
Glucose, Bld: 121 mg/dL — ABNORMAL HIGH (ref 70–99)
Potassium: 3.5 mmol/L (ref 3.5–5.1)
Sodium: 125 mmol/L — ABNORMAL LOW (ref 135–145)

## 2021-10-14 LAB — PROTIME-INR
INR: 2.5 — ABNORMAL HIGH (ref 0.8–1.2)
Prothrombin Time: 26.9 seconds — ABNORMAL HIGH (ref 11.4–15.2)

## 2021-10-14 MED ORDER — DAPAGLIFLOZIN PROPANEDIOL 10 MG PO TABS: 10.0000 mg | ORAL_TABLET | Freq: Every day | ORAL | 6 refills | Status: DC

## 2021-10-14 NOTE — Patient Instructions (Signed)
Start Farxiga 10 mg Daily  Labs done today, we will call you for abnormal results  Your physician recommends that you return for lab work in: 1-2 weeks  Your physician recommends that you schedule a follow-up appointment in: as scheduled 11/30/21  If you have any questions or concerns before your next appointment please send [place] a message through [place] or call our office at [phone].    TO LEAVE A MESSAGE FOR THE NURSE SELECT OPTION 2, PLEASE LEAVE A MESSAGE INCLUDING: YOUR NAME DATE OF BIRTH CALL BACK NUMBER REASON FOR CALL**this is important as we prioritize the call backs  YOU WILL RECEIVE A CALL BACK THE SAME DAY AS LONG AS YOU CALL BEFORE 4:00 PM  At the [facility], you and your health needs are our priority. As part of our continuing mission to provide you with exceptional heart care, we have created designated Provider Care Teams. These Care Teams include your primary Cardiologist (physician) and Advanced Practice Providers (APPs- Physician Assistants and Nurse Practitioners) who all work together to provide you with the care you need, when you need it.   You may see any of the following providers on your designated Care Team at your next follow up: Dr Glori Bickers Dr Haynes Kerns, NP Lyda Jester, [place] [facility] [place], [place] Audry Riles, PharmD   Please be sure to bring in all your medications bottles to every appointment.

## 2021-10-14 NOTE — Progress Notes (Signed)
Advanced Heart Failure Clinic Note   Date:  10/14/2021   PCP:  [name], PA-C  Pulmonology: Dr. Lamonte Sakai Cardiologist:  Dr. Bettina Gavia  HF Cardiologist: Dr. 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 Also has COPD (quit smoking in 2002 - 1 ppd x 25 years), HL, and HTN.  In 8/18 admitted for acute gallstone pancreatitis. Underwent lap cholecystectomy on 07/29/2017. On 8/6 he developed hypotension and was taken back for open laparotomy due to bleeding from an unclear omental source. He was given fluids and 8 units PRBC perioperatively. He says his breathing has been bad since that time.   Seen at [facility] in 2018 had hi-res CT, PFTs and VQ (low prob). Felt to have Golds I COPD with asthma overlap and early pulmonary fibrosis with PAH. He was also seen by Cardiology. Echo 8/18 as below showed normal LV function with septal flattening and RVSP 65-48mHG. 14-day event monitor showed 10-beat run NSVT. Felt to have chronic AF and PAH. No further testing ordered.  Underwent RHC on 01/18/18 with severe PAH and cor pulmonale. Macitentan and Adcirca. Subsequently started on selexipag but dose limited by cramping now on 400/200  Admitted 5/19 with symptomatic hypotension and weight gain. He had a repeat RHC and echo with bubble study (results below). He was started on midodrine for BP support. Diuresed 25 lbs with IV lasix, then transitioned to torsemide 60 mg daily. Abdominal UKoreashowed cirrhosis with no evidence of portal HTN. Pulm consulted for possible ILD on High-res chest CT.  Seen in ED on 01/27/21 due to hypotension due  to COVID and diarrhea. He recovered. He had stable NYHA II symptoms at follow up 4/22, repeat echo scheduled (see results below).  [facility], profound [facility]. [facility] On doxy for 5 weeks.   Acute visit 08/02/21 feeling poorly, NYHA III symptoms, volume ok. Suspected worsening anxiety driving symptoms.    Seen 09/12/21 with NYHA IIIb-IV symptoms. ReDs 38%. Given 80 m g IV lasix + metolazone 2.5 in clinic. Then arranged for 80 mg IV lasix x 2 days with [facility]. He was seen during his lab appt and he was some better but still had LE edema so we did one more dose of 80 mg IV lasix with [facility], last dose given 09/21/21.   Admitted from the cath lab on 09/29/21 with elevated filling pressures. RHC with moderate PAH and elevated filling pressures. Started on IV lasix then transitioned to torsemide. Torsemide held due to creatinine bump with plans to restart 3 days after discharge. INR goal lowered due to ongoing severe ecchymosis/purpura. Hospitalization c/b hyponatremia requiring tolvaptan. Weight 143 lbs.   Today he returns for post hospital HF follow up. Took a metolazone this morning bc his ring felt tight. Feeling pretty good since he has gotten out of the hospital, breathing is about the same. Says he is "carrying too much water."  Denies CP, dizziness, or PND/Orthopnea. Appetite ok. No fever or chills. Weight at home 146 pounds but feels better at 143 lbs. Taking all medications. He has church congregation help him out at home.  Cardiac Studies: - RHC 10/22 Findings:   RA = 12 RV = 71/9 PA = 79/20 (38) PCW = 21 (v =  60) Fick cardiac output/index = 7.0/3.14 PVR = 3.1 WU FA sat = 99% PA sat = 70%, 74% PAPi = 4.9    Assessment: 1. Moderate mixed PAH with mild to moderate volume overload   Echo (4/22): EF 50-55%, RV ok, severe RVSP 61.6 mmHg, severe biatrial enlargement, moderate TR, stable AVR, AV mean gradient 11 mmHg  - Echo (3/21): EF 45-50% AVR ok. Septal flattening RV dilated mild HK. Moderate TR RVSP 80 severe bilateral enlargement.   - RHC 05/14/18 showed Mild/Moderate PAH in setting of high  output with no evidence of intracardiac shunting. Hemodynamics as below.    - High-Res Chest CT 05/16/18 1. Slightly irregular 0.9 cm peripheral right middle lobe solid pulmonary nodule. 3 month follow up recommended.  2. Dependent basilar predominant patchy subpleural reticulation and ground-glass attenuation with the suggestion of minimal associated traction bronchiolectasis. No frank honeycombing. Findings may indicate an interstitial lung disease such as early usual interstitial pneumonia (UIP) or fibrotic phase nonspecific interstitial pneumonia (NSIP). Suggest a follow-up high-resolution chest CT study in 6-12 months to assess temporal pattern stability, as clinically warranted. 3. Mild cardiomegaly. Minimal interlobular septal thickening and trace dependent bilateral pleural effusions.  4. Small to moderate volume perihepatic ascites. 5. Ectatic 4.3 cm ascending thoracic aorta. Recommend annual imaging followup by CTA or MRA.  6. Left main and 3 vessel coronary atherosclerosis.   - US Abdomen RUQ 05/16/18 - s/p cholecystectomy. + hepatic cirrhosis. Mild ascites.    - Liver Doppler 05/16/18 - No hepatic, splenic, or portal venous thrombosis or occlusion. Mild ascites.   - Echo with bubble study 05/16/18 LVEF 55-60% with "late bubbles" , Mechanical AVR stable with trivial perivalvular regurg, Mild MR, Severe LAE, Severe RV dilation and reduced function. No PFO. Mod TR, PA peak pressure 68 mm Hg   - RHC 05/14/18 RA = 18 RV = 71/17 PA = 69/24 (39) PCW = 20 Fick cardiac output/index = 7.0/3.7 Thermo CO/CI = 7.2/3.8 PVR = 2.6 WU Ao sat = 97% PA sat =  73%, 73% SVC sat = 73%  RA sat = 72%  - RHC 01/18/18 RA = 23 RV = 84/21 PA = 81/38 (53) PCW = 22 Fick cardiac output/index = 3.0/1.6 PVR = 10.4 WU FA sat = 98% on 2L  (checked on RA = 93%) PA sat = 60%, 61% SVC sat = 62%  -14 Day Patch 10/01/2017-10/15/2017: -1 run of Ventricular Tachycardia occurred lasting 10 beats with  a max rate of 240 bpm (avg 161 bpm). -Atrial Flutter occurred continuously (100% burden), ranging from 55-132 bpm (avg of 87 bpm).   - NM Ventilation and Perfusion Lung Scan 10/31/2017:  Low probability of pulmonary embolism, using the PIOPED criteria.  - PFTs 07/02/2017:       FVC     86% FEV1   85% FEV1/FVC  73% DLCO  8.2, 32%   - 6MW -  1400 feet; 97% to 85% (recovered with resting)                                    CT Chest 07/02/2017: 1. Stable right middle lobe pulmonary nodule measuring up to 1 cm. 2. Minimal subpleural reticulation without evidence of honeycombing to  suggest pulmonary fibrosis. Stable centrilobular emphysema. 3. Mediastinal lymphadenopathy, increased from prior. 4. Calcified right hilar lymph nodes and multiple calcified granulomas  throughout the right lung consistent with prior granulomatous disease. 5.  Ascending thoracic aortic aneurysm measuring up to 4.5 cm, unchanged. 6. Dilatation of the main pulmonary artery suggestive of pulmonary  hypertension. 7. Cardiomegaly and dense atherosclerotic coronary artery calcifications.   Review of systems complete and found to be negative unless listed in HPI.   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 In light of concurrent left nephrolithiasis, will check CT renal colic next year instead of renal US.    Anticoagulated on Coumadin 01/04/2018   Anxiety 05/10/2016   Last Assessment & Plan:  Doing well off of zoloft.   Asthma    Asymptomatic microscopic hematuria 06/20/2017   Last Assessment & Plan:  Had hematuria workup in [facility] in 2016 which negative CT and cystoscopy. UA with 2+ blood last visit - we discussed recommendation for repeat workup at 5 years or if degree of hematuria progresses.    Atrial fibrillation ([place])  03/29/2017   Atrial flutter ([place]) 03/29/2017   Backache 12/14/2015   Last Assessment & Plan:  Pain management referral for further evaluation.   Bilateral pleural effusion 08/09/2017   CHF (congestive heart failure) (HCC)    Chronic allergic rhinitis 04/28/2016   Last Assessment & Plan:  Continue astelin   Chronic anticoagulation 03/29/2017   Chronic atrial fibrillation ([place]) 07/23/2015   Last Assessment & Plan:  Coumadin and metoprolol, cardiology referral to establish care.   Chronic midline back pain 12/14/2015   Last Assessment & Plan:  Pain management referral for further evaluation.   Chronic obstructive lung disease ([place]) 06/12/2016   With hypoxia   Chronic prostatitis 07/23/2015   Last Assessment & Plan:  Has largely resolved since stopping bike riding. Recommend annual DRE AND PSA - will see back 12/2015 for annual screening, given 1st degree fhx. To call office for recurrent prostatitis symptoms.    Chronic respiratory failure with hypoxia ([place]) 07/29/2019   Cirrhosis of liver not due to alcohol ([place]) 07/01/2019   COPD (chronic obstructive pulmonary disease) ([place]) 06/12/2016   Coronary arteriosclerosis in native artery 03/29/2017   Coronary artery disease involving native coronary artery of native heart with angina pectoris ([place]) 03/29/2017   Cough 10/17/2016   Last Assessment & Plan:  Discussed typical course for acute viral illness. If symptoms worsen or fail to improve by 7-10d, delayed ATBs, fluids, rest, NSAIDs/APAP prn. Seek care if not improving. Needs earlier INR check due to ATBs.   Dyspnea 02/01/2016   Last Assessment & Plan:  Overall improving, eval by pulm, plan for CT, neg stress test with cardiology. Recent switch to carvedilol due to side effects.   Encounter for therapeutic drug monitoring 01/06/2019   Enterococcal bacteremia    Epidermoid cyst of skin 08/24/2017   Essential hypertension 12/14/2015   Last Assessment & Plan:  Hypertension control: controlled  Medications: compliant  Medication Management: as noted in orders Home blood pressure monitoring recommended additionally as needed for symptoms  The patient's care plan was reviewed and updated. Instructions and counseling were provided regarding patient goals and barriers. He was counseled to adopt a healthy lifestyle. Educational resources and self-management tools have been provided as charted in [facility] list.    H/O maze procedure 03/29/2017   [name] 03/29/2017   Overview:  2011   History of coronary artery bypass graft 03/29/2017  Hx of CABG 03/29/2017   Hyperlipidemia 03/29/2017   Hypersensitivity angiitis ([place]) 10/01/2017   Hypertensive heart disease 03/29/2017   Hypertensive heart disease with heart failure ([place]) 03/29/2017   Hypertensive heart failure ([place]) 03/29/2017   Hypokalemia due to excessive renal loss of potassium 02/18/2018   Hypotension, chronic 06/06/2018   International normalized ratio (INR) raised 07/26/2017   Kidney stone 07/23/2015   Kidney stones 07/23/2015   Overview:  x 3  Last Assessment & Plan:  By [place] has left nephrolithiasis, but not visible by KUB. Will check CT renal colic next year to assess both stone burden as well as to surveil AML.    Left ureteral stone 01/23/2018   Leukocytoclastic vasculitis ([place]) 10/01/2017   Localized edema 01/04/2018   Long term (current) use of anticoagulants 03/29/2017   Lumbar radicular pain 01/19/2016   Lumbar radiculopathy 01/19/2016   Maculopapular rash 09/03/2017   Microscopic hematuria 06/20/2017   Last Assessment & Plan:  Had hematuria workup in [facility] in 2016 which negative CT and cystoscopy. UA with 2+ blood last visit - we discussed recommendation for repeat workup at 5 years or if degree of hematuria progresses.    Multiple nodules of lung 06/12/2016   Nasal discharge 02/25/2016   Last Assessment & Plan:  Trial zyrtec and flonase   Nephrolithiasis 07/23/2015   Overview:  x 3  Last Assessment & Plan:  By [place] has left nephrolithiasis, but not visible  by KUB. Will check CT renal colic next year to assess both stone burden as well as to surveil AML.   Overview:  x 3  Last Assessment & Plan:  Has 84m nonobstructing LUP stone - not visible by KUB.  Will check renal [place]/2019 - he will contact office sooner if symptomatic.    Non-sustained ventricular tachycardia 03/29/2017   Nonsustained ventricular tachycardia 03/29/2017   Other hyperlipidemia 03/29/2017   Palpitations 10/01/2017   Pleural effusion, bilateral 08/09/2017   Pneumonia due to COVID-19 virus 02/07/2021   Post-nasal drainage 02/25/2016   Last Assessment & Plan:  Trial zyrtec and flonase   Prostate cancer screening 06/20/2017   Last Assessment & Plan:  Recommend continued annual CaP screening until within 10 years of life expectancy. Given good health and fhx of longevity, would anticipate CaP screening to continue until age 75  PSA today and again in one year on day of visit.   Pulmonary arterial hypertension ([place] 02/20/2018   Pulmonary edema    Pulmonary hypertension ([place] 08/09/2017   Pulmonary nodules 06/12/2016   S/P AVR 03/20/2016   S/P AVR (aortic valve replacement) 03/20/2016   Strain of deltoid muscle, initial encounter    Supratherapeutic INR 07/26/2017   Syncope and collapse 02/01/2016   Typical atrial flutter ([place] 02/01/2016   Past Surgical History:  Procedure Laterality Date   CHOLECYSTECTOMY     CORONARY ARTERY BYPASS GRAFT     EXPLORATORY LAPAROTOMY  07/30/2017   FOOT SURGERY     FRACTURE SURGERY Right    wrist and forearm   HERNIA REPAIR     MECHANICAL AORTIC VALVE REPLACEMENT     NASAL SINUS SURGERY     RIGHT HEART CATH N/A 01/18/2018   Procedure: RIGHT HEART CATH;  Surgeon: BJolaine Artist MD;  [facility];  Service: Cardiovascular;  Laterality: N/A;   RIGHT HEART CATH N/A 05/14/2018   Procedure: RIGHT HEART CATH;  Surgeon: BJolaine Artist MD;  [facility];  Service: Cardiovascular;  Laterality:  N/A;   RIGHT HEART CATH N/A  09/29/2021   Procedure: RIGHT HEART CATH;  Surgeon: Jolaine Artist, MD;  [facility];  Service: Cardiovascular;  Laterality: N/A;   TEE WITHOUT CARDIOVERSION N/A 05/21/2018   Procedure: TRANSESOPHAGEAL ECHOCARDIOGRAM (TEE);  Surgeon: Jolaine Artist, MD;  Location: [facility] ENDOSCOPY;  Service: Cardiovascular;  Laterality: N/A;   UPPER GASTROINTESTINAL ENDOSCOPY  07/12/2017   Patchy areas of mucosal inflammation noted in the antrum with edema,erthema and ulcerations. Bx. Chronicfocally active gastritis.   VASECTOMY     Current Medications: Current Meds  Medication Sig   acetaminophen (TYLENOL) 500 MG tablet Take 500-1,000 mg by mouth every 6 (six) hours as needed (pain.).   albuterol (VENTOLIN HFA) 108 (90 Base) MCG/ACT inhaler Inhale 2 puffs into the lungs every 6 (six) hours as needed for wheezing or shortness of breath.   ammonium lactate (LAC-HYDRIN) 12 % lotion Apply 1 application topically as needed for dry skin.   Ascorbic Acid (VITAMIN C GUMMIE PO) Take 2 tablets by mouth daily at 12 noon.   aspirin EC 81 MG tablet Take 81 mg by mouth in the morning.   cephALEXin (KEFLEX) 500 MG capsule Take 1 capsule (500 mg total) by mouth 2 (two) times daily.   Cholecalciferol (VITAMIN D3) 50 MCG (2000 UT) TABS Take 2,000 Units by mouth daily at 12 noon.   Coenzyme Q10-Vitamin E (QUNOL ULTRA COQ10 PO) Take 1 capsule by mouth in the morning.   famotidine-calcium carbonate-magnesium hydroxide (PEPCID COMPLETE) 10-800-165 MG chewable tablet Chew 2 tablets by mouth daily as needed (indigestion/heartburn).   KRILL OIL PO Take 800 mg by mouth every other day. In the morning   Magnesium 200 MG CHEW Chew 400 mg by mouth daily at 12 noon.   metolazone (ZAROXOLYN) 2.5 MG tablet TAKE 1 TABLET(2.5 MG) BY MOUTH DAILY AS NEEDED FOR FLUID RETENTION   midodrine (PROAMATINE) 5 MG tablet Take 3 tablets (15 mg total) by mouth 3 (three) times daily with meals.   OPSUMIT 10 MG tablet TAKE 1 TABLET  DAILY   potassium chloride SA (KLOR-CON) 10 MEQ tablet Take 4 tablets (40 mEq total) by mouth 2 (two) times daily. Please dispense 10 meq tablets (take 40 meq bid)   rosuvastatin (CRESTOR) 5 MG tablet TAKE 1 TABLET(5 MG) BY MOUTH DAILY   Selexipag (UPTRAVI) 200 MCG TABS Take 200 mcg by mouth every evening.   Selexipag (UPTRAVI) 400 MCG TABS Take 400 mcg by mouth in the morning.   spironolactone (ALDACTONE) 25 MG tablet Take 1 tablet (25 mg total) by mouth daily.   tadalafil, PAH, (ADCIRCA) 20 MG tablet Take 40 mg by mouth in the morning.   TART CHERRY PO Take 2 tablets by mouth daily at 12 noon. HumanN Tart Cherry Gummy by [employer]   torsemide (DEMADEX) 20 MG tablet Take 2 tablets (40 mg total) by mouth 2 (two) times daily.   Turmeric 500 MG TABS Take 1 capsule by mouth daily in the afternoon. With Ginger 50 mg   vitamin B-12 (CYANOCOBALAMIN) 1000 MCG tablet Take 1,000 mcg by mouth daily at 12 noon.   warfarin (COUMADIN) 5 MG tablet TAKE 1/2 DAILY DAILY AS DIRECTED BY COUMADIN CLINIC   Zinc 50 MG TABS Take 50 mg by mouth every other day. In the afternoon    Allergies:   Pantoprazole sodium, Valium [diazepam], Tape, and Wound dressing adhesive   Social History   Socioeconomic History   Marital status: Single    Spouse  name: Not on file   Number of children: Not on file   Years of education: Not on file   Highest education level: Not on file  Occupational History   Not on file  Tobacco Use   Smoking status: Former    Packs/day: 2.00    Years: 34.00    Pack years: 68.00    Types: Cigarettes    Quit date: 07/16/2000    Years since quitting: 21.2   Smokeless tobacco: Never  Vaping Use   Vaping Use: Never used  Substance and Sexual Activity   Alcohol use: Not Currently   Drug use: No   Sexual activity: Not on file  Other Topics Concern   Not on file  Social History Narrative   Not on file   Social Determinants of Health   Financial Resource Strain: Low Risk    Difficulty  of Paying Living Expenses: Not very hard  Food Insecurity: No Food Insecurity   Worried About Running Out of Food in the Last Year: Never true   Ran Out of Food in the Last Year: Never true  Transportation Needs: No Transportation Needs   Lack of Transportation (Medical): No   Lack of Transportation (Non-Medical): No  Physical Activity: Not on file  Stress: Not on file  Social Connections: Not on file    Family History: The patient's family history includes Arthritis in his mother; Asthma in his mother; Heart attack in his father; Hypertension in his father; Stroke in his paternal grandmother. There is no history of Colon cancer.  Recent Labs: 08/16/2021: Pro B Natriuretic peptide (BNP) 601.0 08/17/2021: TSH 2.71 09/29/2021: ALT 17 09/30/2021: B Natriuretic Peptide 840.7 10/02/2021: Magnesium 2.0 10/04/2021: BUN 41; Creatinine, Ser 2.05; Potassium 3.4; Sodium 127 10/11/2021: Hemoglobin 10.6; Platelets 189.0   Recent Lipid Panel    Component Value Date/Time   CHOL 133 10/12/2020 1104   TRIG 111 10/12/2020 1104   HDL 44 10/12/2020 1104   CHOLHDL 3.0 10/12/2020 1104   VLDL 11 10/21/2018 1028   LDLCALC 70 10/12/2020 1104   BP 138/70   Pulse 76   Wt 65 kg (143 lb 3.2 oz)   SpO2 96%   BMI 21.15 kg/m     Wt Readings from Last 3 Encounters:  10/14/21 65 kg (143 lb 3.2 oz)  10/11/21 65 kg (143 lb 3.2 oz)  10/05/21 64.4 kg (142 lb)   Physical Exam:  General:  NAD. No resp difficulty, chronically-ill appearing, thin HEENT: Normal Neck: Supple. JVP 7-8, +v-waves. Carotids 2+ bilat; no bruits. No lymphadenopathy or thryomegaly appreciated. Cor: PMI nondisplaced. Irregular rate & rhythm. No rubs, gallops , 2/6 SEM Lungs: Clear Abdomen: Soft, nontender, nondistended. No hepatosplenomegaly. No bruits or masses. Good bowel sounds. Extremities: No cyanosis, clubbing, rash, 1+ BLE edema Neuro: Alert & oriented x 3, cranial nerves grossly intact. Moves all 4 extremities w/o difficulty.  Affect pleasant.  Assessment/Plan 1. Chronic diastolic HF - Admitted 65/46/50 for IV diuresis after moderately elevated filling pressures on RHC.  - Echo 10/22 EF 45-50 RV low normal  - Stable NYHA III, mild LE edema but volume looks good, weight stable. - Start Farxiga 10 mg daily. - Continue spiro 25 mg daily. - Continue torsemide 40 mg bid. - Can take a metolazone 1-2x/month PRN + extra 40 KCl  - BMET today, repeat in 10 days.   2. Pulmonary hypertension with cor pulmonale/RV failure - WHO Group I & II - Auto-immune serologies negative (checked twice). - ? Component of  HHT/shunt/AVMs with late bubbles on bubble study.  - Echo with bubble study (05/16/18): EF 55-60% with "late bubbles" , Mechanical AVR stable with trivial perivalvular regurg, Mild MR, Severe LAE, Severe RV dilation and reduced function. No PFO. Mod TR, PA peak pressure 68 mm Hg - Echo (4/22): EF 50-55%, RV ok, severe RVSP 61.6 mmHg, severe biatrial enlargement, moderate TR, stable AVR, AV mean gradient 11 mmHg - Echo 10/22 EF 45-50 RV low normal  - RHC data as above - Ab u/s with cirrhosis but no evidence of portal HTN. - On triple therapy with Macitentan and Adcirca. Uptravi dose at 400/200 limited by cramping.  Numbers not c/w need for IV therapies   3. Chronic respiratory failure - [place] CT 05/16/18 with "dependent basilar predominant patchy subpleural reticulation and ground-glass attenuation with the suggestion of minimal associated traction bronchiolectasis. No frank honeycombing. Findings may indicate an interstitial lung disease such as early usual interstitial pneumonia (UIP) or fibrotic phase nonspecific interstitial pneumonia (NSIP)." - Followed by Dr. Lamonte Sakai.   4. Chronic AFL/pauses - Rate controlled, rate 50s-60s.  Now off metoprolol.  - Multiple pauses 2-3 sec during recent admit 10/22. - Keep off beta blocker.  - Continue coumadin with mechanical AVR. - INR followed by [facility] in [place].    5. Frequent PVCs - This may be contributing to HF - Repeat echo stable    6 CAD - s/p CABG 07/2017 with Dr Roderic Palau in [place].  - No s/s ischemia - Continue Crestor 5 mg daily (unable to tolerate atorva due to myalgias).  - Lipids followed by Dr. Bettina Gavia.   7. S/p mechanical AVR - Stable on Echo 4/22. - On coumadin. I - Aware of need for SBE prophylaxis.   8. Leukocytoclastic vasculitis - F/u with Rheumatology.  - No changes.   9. Cirrhosis - US Abdomen RUQ 05/16/18 - s/p cholecystectomy. + hepatic cirrhosis. Mild ascites.  - Liver Doppler 05/16/18 - No hepatic, splenic, or portal venous thrombosis or occlusion. Mild ascites. - Continue midodrine 15 mg bid.   Follow up in 6 weeks with Dr. Haroldine Laws as scheduled.  Allena Katz, FNP-BC 10/14/21

## 2021-10-16 LAB — VITAMIN B1: Vitamin B1 (Thiamine): 125 nmol/L — ABNORMAL HIGH (ref 8–30)

## 2021-10-18 ENCOUNTER — Other Ambulatory Visit: Payer: Self-pay

## 2021-10-18 ENCOUNTER — Ambulatory Visit (INDEPENDENT_AMBULATORY_CARE_PROVIDER_SITE_OTHER): Payer: Medicare Other

## 2021-10-18 DIAGNOSIS — I482 Chronic atrial fibrillation, unspecified: Secondary | ICD-10-CM

## 2021-10-18 DIAGNOSIS — Z5181 Encounter for therapeutic drug level monitoring: Secondary | ICD-10-CM

## 2021-10-18 LAB — POCT INR: INR: 2.1 (ref 2.0–3.0)

## 2021-10-18 NOTE — Patient Instructions (Signed)
Description   Take 1 tablet today and then continue taking 1/2 tablet daily except 1 tablet on Monday, Wednesday and Friday.  Please call our office with any medication changes or concerns [phone]. Return in 3 weeks for INR check.

## 2021-10-18 NOTE — Telephone Encounter (Signed)
Saw CHF clinic APP on 10/21, next appt 12/7 with Dr Haroldine Laws. EGD still scheduled 12/19 per Epic. If he is cleared by Dr Haroldine Laws at that visit, he will require 5 day warfarin hold with Lovenox bridge given his hx of mechanical AVR and afib (would need lower 74m/kg once daily therapeutic dosing if CrCl remains < 30).

## 2021-10-18 NOTE — Telephone Encounter (Signed)
Covering preop today. See message below - Dr. Haroldine Laws had recommended to defer procedure unless absolutely necessary given his recent HF flare. Gillermina Hu, RN, with GI team has routed to Dr. Lyndel Safe - will await response if GI plans to proceed versus defer at this time.

## 2021-10-20 ENCOUNTER — Ambulatory Visit: Payer: Medicare Other | Admitting: Podiatry

## 2021-10-25 NOTE — Telephone Encounter (Signed)
Please see notes below and advise

## 2021-10-26 ENCOUNTER — Other Ambulatory Visit (HOSPITAL_COMMUNITY): Payer: Self-pay | Admitting: *Deleted

## 2021-10-26 ENCOUNTER — Other Ambulatory Visit (HOSPITAL_COMMUNITY): Payer: Medicare Other

## 2021-10-26 ENCOUNTER — Telehealth: Payer: Self-pay | Admitting: Medical

## 2021-10-26 DIAGNOSIS — T148XXA Other injury of unspecified body region, initial encounter: Secondary | ICD-10-CM | POA: Diagnosis not present

## 2021-10-26 DIAGNOSIS — Z23 Encounter for immunization: Secondary | ICD-10-CM | POA: Diagnosis not present

## 2021-10-26 DIAGNOSIS — D692 Other nonthrombocytopenic purpura: Secondary | ICD-10-CM | POA: Diagnosis not present

## 2021-10-26 DIAGNOSIS — L570 Actinic keratosis: Secondary | ICD-10-CM | POA: Diagnosis not present

## 2021-10-26 DIAGNOSIS — L578 Other skin changes due to chronic exposure to nonionizing radiation: Secondary | ICD-10-CM | POA: Diagnosis not present

## 2021-10-26 MED ORDER — SPIRONOLACTONE 25 MG PO TABS: 25.0000 mg | ORAL_TABLET | Freq: Every day | ORAL | 3 refills | Status: DC

## 2021-10-26 NOTE — Telephone Encounter (Signed)
Pt Previsit appointment canceled along with EGD/ Colon. Pt made aware Pt verbalized understanding with all questions answered. Pt states that he has been having reflux for several months now. Requesting a prescription to relieve reflux. Please advise

## 2021-10-26 NOTE — Telephone Encounter (Signed)
It's a new doctor

## 2021-10-26 NOTE — Telephone Encounter (Signed)
Pt was requesting a referral for neuropathy please advise. He confirmed he can be reached at his phone number listed in his chart.

## 2021-10-26 NOTE — Telephone Encounter (Signed)
No safe at this time Lets defer EGD/colonoscopy for now. RG

## 2021-10-27 ENCOUNTER — Other Ambulatory Visit (HOSPITAL_COMMUNITY): Payer: Self-pay | Admitting: Internal Medicine

## 2021-10-27 NOTE — Telephone Encounter (Signed)
Pt states he went to the ER and they stated he had cellulitis and you treated him for the cellulitis , but er and cardiology stated pt could possibly have neuropathy and he has leg cramps that keep him up at night.

## 2021-10-27 NOTE — Telephone Encounter (Signed)
Pt called and lvm to return call

## 2021-10-28 ENCOUNTER — Other Ambulatory Visit (HOSPITAL_COMMUNITY): Payer: Self-pay

## 2021-10-28 MED ORDER — MIDODRINE HCL 5 MG PO TABS: 15.0000 mg | ORAL_TABLET | Freq: Three times a day (TID) | ORAL | 5 refills | Status: DC

## 2021-10-28 NOTE — Telephone Encounter (Signed)
Appt scheduled for 11/01/21

## 2021-10-31 ENCOUNTER — Other Ambulatory Visit (HOSPITAL_COMMUNITY): Payer: Self-pay | Admitting: *Deleted

## 2021-10-31 MED ORDER — TORSEMIDE 20 MG PO TABS: 40.0000 mg | ORAL_TABLET | Freq: Two times a day (BID) | ORAL | 6 refills | Status: DC

## 2021-10-31 MED ORDER — SPIRONOLACTONE 25 MG PO TABS
25.0000 mg | ORAL_TABLET | Freq: Every day | ORAL | 3 refills | Status: DC
Start: 2021-10-31 — End: 2021-11-02

## 2021-11-01 ENCOUNTER — Ambulatory Visit: Payer: Medicare Other | Admitting: Medical

## 2021-11-02 ENCOUNTER — Other Ambulatory Visit (HOSPITAL_COMMUNITY): Payer: Self-pay

## 2021-11-02 ENCOUNTER — Other Ambulatory Visit (HOSPITAL_COMMUNITY): Payer: Self-pay | Admitting: Cardiology

## 2021-11-02 ENCOUNTER — Telehealth (HOSPITAL_COMMUNITY): Payer: Self-pay | Admitting: Family Medicine

## 2021-11-02 ENCOUNTER — Ambulatory Visit (HOSPITAL_COMMUNITY)
Admission: RE | Admit: 2021-11-02 | Discharge: 2021-11-02 | Disposition: A | Discharge status: Home / Self Care | Payer: Medicare Other | Source: Ambulatory Visit | Attending: Internal Medicine | Admitting: Internal Medicine

## 2021-11-02 DIAGNOSIS — E7849 Other hyperlipidemia: Secondary | ICD-10-CM | POA: Diagnosis present

## 2021-11-02 DIAGNOSIS — X58XXXA Exposure to other specified factors, initial encounter: Secondary | ICD-10-CM | POA: Diagnosis present

## 2021-11-02 DIAGNOSIS — Z8249 Family history of ischemic heart disease and other diseases of the circulatory system: Secondary | ICD-10-CM | POA: Diagnosis not present

## 2021-11-02 DIAGNOSIS — I5032 Chronic diastolic (congestive) heart failure: Secondary | ICD-10-CM | POA: Insufficient documentation

## 2021-11-02 DIAGNOSIS — Z7982 Long term (current) use of aspirin: Secondary | ICD-10-CM | POA: Diagnosis not present

## 2021-11-02 DIAGNOSIS — Z823 Family history of stroke: Secondary | ICD-10-CM | POA: Diagnosis not present

## 2021-11-02 DIAGNOSIS — D649 Anemia, unspecified: Secondary | ICD-10-CM | POA: Diagnosis present

## 2021-11-02 DIAGNOSIS — I4821 Permanent atrial fibrillation: Secondary | ICD-10-CM | POA: Diagnosis present

## 2021-11-02 DIAGNOSIS — I5033 Acute on chronic diastolic (congestive) heart failure: Secondary | ICD-10-CM | POA: Diagnosis present

## 2021-11-02 DIAGNOSIS — Z8261 Family history of arthritis: Secondary | ICD-10-CM | POA: Diagnosis not present

## 2021-11-02 DIAGNOSIS — J432 Centrilobular emphysema: Secondary | ICD-10-CM | POA: Diagnosis present

## 2021-11-02 DIAGNOSIS — Z79899 Other long term (current) drug therapy: Secondary | ICD-10-CM | POA: Diagnosis not present

## 2021-11-02 DIAGNOSIS — Z8616 Personal history of COVID-19: Secondary | ICD-10-CM | POA: Diagnosis not present

## 2021-11-02 DIAGNOSIS — Z952 Presence of prosthetic heart valve: Secondary | ICD-10-CM | POA: Diagnosis not present

## 2021-11-02 DIAGNOSIS — I5043 Acute on chronic combined systolic (congestive) and diastolic (congestive) heart failure: Secondary | ICD-10-CM | POA: Diagnosis not present

## 2021-11-02 DIAGNOSIS — Z87891 Personal history of nicotine dependence: Secondary | ICD-10-CM | POA: Diagnosis not present

## 2021-11-02 DIAGNOSIS — I110 Hypertensive heart disease with heart failure: Secondary | ICD-10-CM

## 2021-11-02 DIAGNOSIS — N1831 Chronic kidney disease, stage 3a: Secondary | ICD-10-CM | POA: Diagnosis present

## 2021-11-02 DIAGNOSIS — I272 Pulmonary hypertension, unspecified: Secondary | ICD-10-CM | POA: Diagnosis not present

## 2021-11-02 DIAGNOSIS — I517 Cardiomegaly: Secondary | ICD-10-CM | POA: Diagnosis not present

## 2021-11-02 DIAGNOSIS — Z825 Family history of asthma and other chronic lower respiratory diseases: Secondary | ICD-10-CM | POA: Diagnosis not present

## 2021-11-02 DIAGNOSIS — N179 Acute kidney failure, unspecified: Secondary | ICD-10-CM | POA: Diagnosis present

## 2021-11-02 DIAGNOSIS — I130 Hypertensive heart and chronic kidney disease with heart failure and stage 1 through stage 4 chronic kidney disease, or unspecified chronic kidney disease: Secondary | ICD-10-CM | POA: Diagnosis present

## 2021-11-02 DIAGNOSIS — K7460 Unspecified cirrhosis of liver: Secondary | ICD-10-CM | POA: Diagnosis present

## 2021-11-02 DIAGNOSIS — I2781 Cor pulmonale (chronic): Secondary | ICD-10-CM | POA: Diagnosis present

## 2021-11-02 DIAGNOSIS — R0600 Dyspnea, unspecified: Secondary | ICD-10-CM | POA: Diagnosis not present

## 2021-11-02 DIAGNOSIS — Z7901 Long term (current) use of anticoagulants: Secondary | ICD-10-CM | POA: Diagnosis not present

## 2021-11-02 DIAGNOSIS — S41112A Laceration without foreign body of left upper arm, initial encounter: Secondary | ICD-10-CM | POA: Diagnosis present

## 2021-11-02 DIAGNOSIS — Z951 Presence of aortocoronary bypass graft: Secondary | ICD-10-CM | POA: Diagnosis not present

## 2021-11-02 DIAGNOSIS — I2721 Secondary pulmonary arterial hypertension: Secondary | ICD-10-CM | POA: Diagnosis present

## 2021-11-02 DIAGNOSIS — E871 Hypo-osmolality and hyponatremia: Secondary | ICD-10-CM | POA: Diagnosis not present

## 2021-11-02 LAB — BASIC METABOLIC PANEL
Anion gap: 11 (ref 5–15)
BUN: 38 mg/dL — ABNORMAL HIGH (ref 8–23)
CO2: 29 mmol/L (ref 22–32)
Calcium: 9.2 mg/dL (ref 8.9–10.3)
Chloride: 81 mmol/L — ABNORMAL LOW (ref 98–111)
Creatinine, Ser: 1.59 mg/dL — ABNORMAL HIGH (ref 0.61–1.24)
GFR, Estimated: 45 mL/min — ABNORMAL LOW (ref >60)
Glucose, Bld: 97 mg/dL (ref 70–99)
Potassium: 3.6 mmol/L (ref 3.5–5.1)
Sodium: 121 mmol/L — ABNORMAL LOW (ref 135–145)

## 2021-11-02 MED ORDER — POTASSIUM CHLORIDE CRYS ER 10 MEQ PO TBCR: 40.0000 meq | EXTENDED_RELEASE_TABLET | Freq: Two times a day (BID) | ORAL | 3 refills | Status: DC

## 2021-11-02 MED ORDER — SPIRONOLACTONE 25 MG PO TABS: 25.0000 mg | ORAL_TABLET | Freq: Every day | ORAL | 3 refills | Status: DC

## 2021-11-02 MED ORDER — TORSEMIDE 20 MG PO TABS: 40.0000 mg | ORAL_TABLET | Freq: Two times a day (BID) | ORAL | 4 refills | Status: DC

## 2021-11-02 NOTE — Telephone Encounter (Signed)
Called and spoke to Lance Hicks about his critically low sodium on labs today. He had been without his diuretic x 4 days, weight up 3 lbs. Could be dilutional due to hypervolemia, told to restrict water and he will come for repeat labs in the morning.   Allena Katz, FNP-BC

## 2021-11-03 ENCOUNTER — Other Ambulatory Visit (HOSPITAL_COMMUNITY): Payer: Self-pay

## 2021-11-03 ENCOUNTER — Other Ambulatory Visit (HOSPITAL_COMMUNITY): Payer: Self-pay | Admitting: Family Medicine

## 2021-11-03 ENCOUNTER — Ambulatory Visit (HOSPITAL_COMMUNITY)
Admission: RE | Admit: 2021-11-03 | Discharge: 2021-11-03 | Disposition: A | Discharge status: Home / Self Care | Payer: Medicare Other | Source: Ambulatory Visit | Attending: Cardiology | Admitting: Cardiology

## 2021-11-03 ENCOUNTER — Inpatient Hospital Stay (HOSPITAL_COMMUNITY)
Admission: AD | Admit: 2021-11-03 | Discharge: 2021-11-09 | DRG: 291 | Disposition: A | Discharge status: Home Health | Payer: Medicare Other | Source: Ambulatory Visit | Attending: Internal Medicine | Admitting: Internal Medicine

## 2021-11-03 ENCOUNTER — Encounter (HOSPITAL_COMMUNITY): Payer: Self-pay

## 2021-11-03 DIAGNOSIS — Z79899 Other long term (current) drug therapy: Secondary | ICD-10-CM | POA: Diagnosis not present

## 2021-11-03 DIAGNOSIS — K7460 Unspecified cirrhosis of liver: Secondary | ICD-10-CM | POA: Diagnosis present

## 2021-11-03 DIAGNOSIS — I5033 Acute on chronic diastolic (congestive) heart failure: Secondary | ICD-10-CM | POA: Diagnosis present

## 2021-11-03 DIAGNOSIS — Z7901 Long term (current) use of anticoagulants: Secondary | ICD-10-CM

## 2021-11-03 DIAGNOSIS — Z8261 Family history of arthritis: Secondary | ICD-10-CM | POA: Diagnosis not present

## 2021-11-03 DIAGNOSIS — N1831 Chronic kidney disease, stage 3a: Secondary | ICD-10-CM | POA: Diagnosis present

## 2021-11-03 DIAGNOSIS — J432 Centrilobular emphysema: Secondary | ICD-10-CM | POA: Diagnosis present

## 2021-11-03 DIAGNOSIS — I493 Ventricular premature depolarization: Secondary | ICD-10-CM | POA: Insufficient documentation

## 2021-11-03 DIAGNOSIS — I2721 Secondary pulmonary arterial hypertension: Secondary | ICD-10-CM | POA: Diagnosis present

## 2021-11-03 DIAGNOSIS — I4821 Permanent atrial fibrillation: Secondary | ICD-10-CM | POA: Diagnosis present

## 2021-11-03 DIAGNOSIS — J9610 Chronic respiratory failure, unspecified whether with hypoxia or hypercapnia: Secondary | ICD-10-CM | POA: Insufficient documentation

## 2021-11-03 DIAGNOSIS — X58XXXA Exposure to other specified factors, initial encounter: Secondary | ICD-10-CM | POA: Diagnosis present

## 2021-11-03 DIAGNOSIS — Z91048 Other nonmedicinal substance allergy status: Secondary | ICD-10-CM

## 2021-11-03 DIAGNOSIS — Z952 Presence of prosthetic heart valve: Secondary | ICD-10-CM | POA: Diagnosis not present

## 2021-11-03 DIAGNOSIS — Z9049 Acquired absence of other specified parts of digestive tract: Secondary | ICD-10-CM | POA: Insufficient documentation

## 2021-11-03 DIAGNOSIS — Z8616 Personal history of COVID-19: Secondary | ICD-10-CM | POA: Diagnosis not present

## 2021-11-03 DIAGNOSIS — I2781 Cor pulmonale (chronic): Secondary | ICD-10-CM | POA: Diagnosis present

## 2021-11-03 DIAGNOSIS — I5043 Acute on chronic combined systolic (congestive) and diastolic (congestive) heart failure: Secondary | ICD-10-CM | POA: Diagnosis present

## 2021-11-03 DIAGNOSIS — R188 Other ascites: Secondary | ICD-10-CM | POA: Insufficient documentation

## 2021-11-03 DIAGNOSIS — M310 Hypersensitivity angiitis: Secondary | ICD-10-CM | POA: Insufficient documentation

## 2021-11-03 DIAGNOSIS — Z87891 Personal history of nicotine dependence: Secondary | ICD-10-CM | POA: Diagnosis not present

## 2021-11-03 DIAGNOSIS — S41112A Laceration without foreign body of left upper arm, initial encounter: Secondary | ICD-10-CM | POA: Diagnosis present

## 2021-11-03 DIAGNOSIS — D649 Anemia, unspecified: Secondary | ICD-10-CM | POA: Diagnosis present

## 2021-11-03 DIAGNOSIS — Z951 Presence of aortocoronary bypass graft: Secondary | ICD-10-CM

## 2021-11-03 DIAGNOSIS — Z888 Allergy status to other drugs, medicaments and biological substances status: Secondary | ICD-10-CM | POA: Insufficient documentation

## 2021-11-03 DIAGNOSIS — Z823 Family history of stroke: Secondary | ICD-10-CM

## 2021-11-03 DIAGNOSIS — E7849 Other hyperlipidemia: Secondary | ICD-10-CM | POA: Insufficient documentation

## 2021-11-03 DIAGNOSIS — I509 Heart failure, unspecified: Secondary | ICD-10-CM

## 2021-11-03 DIAGNOSIS — Z7984 Long term (current) use of oral hypoglycemic drugs: Secondary | ICD-10-CM | POA: Insufficient documentation

## 2021-11-03 DIAGNOSIS — Z7982 Long term (current) use of aspirin: Secondary | ICD-10-CM | POA: Diagnosis not present

## 2021-11-03 DIAGNOSIS — N179 Acute kidney failure, unspecified: Secondary | ICD-10-CM | POA: Diagnosis present

## 2021-11-03 DIAGNOSIS — I081 Rheumatic disorders of both mitral and tricuspid valves: Secondary | ICD-10-CM | POA: Insufficient documentation

## 2021-11-03 DIAGNOSIS — E871 Hypo-osmolality and hyponatremia: Secondary | ICD-10-CM | POA: Diagnosis present

## 2021-11-03 DIAGNOSIS — I2729 Other secondary pulmonary hypertension: Secondary | ICD-10-CM | POA: Insufficient documentation

## 2021-11-03 DIAGNOSIS — I5042 Chronic combined systolic (congestive) and diastolic (congestive) heart failure: Secondary | ICD-10-CM | POA: Diagnosis present

## 2021-11-03 DIAGNOSIS — Z825 Family history of asthma and other chronic lower respiratory diseases: Secondary | ICD-10-CM

## 2021-11-03 DIAGNOSIS — I130 Hypertensive heart and chronic kidney disease with heart failure and stage 1 through stage 4 chronic kidney disease, or unspecified chronic kidney disease: Principal | ICD-10-CM | POA: Diagnosis present

## 2021-11-03 DIAGNOSIS — I5032 Chronic diastolic (congestive) heart failure: Secondary | ICD-10-CM

## 2021-11-03 DIAGNOSIS — E876 Hypokalemia: Secondary | ICD-10-CM | POA: Diagnosis present

## 2021-11-03 DIAGNOSIS — I2510 Atherosclerotic heart disease of native coronary artery without angina pectoris: Secondary | ICD-10-CM | POA: Insufficient documentation

## 2021-11-03 DIAGNOSIS — Z8249 Family history of ischemic heart disease and other diseases of the circulatory system: Secondary | ICD-10-CM

## 2021-11-03 DIAGNOSIS — I272 Pulmonary hypertension, unspecified: Secondary | ICD-10-CM

## 2021-11-03 DIAGNOSIS — Z8619 Personal history of other infectious and parasitic diseases: Secondary | ICD-10-CM | POA: Insufficient documentation

## 2021-11-03 LAB — CBC WITH DIFFERENTIAL/PLATELET
Abs Immature Granulocytes: 0.01 10*3/uL (ref 0.00–0.07)
Basophils Absolute: 0 10*3/uL (ref 0.0–0.1)
Basophils Relative: 1 %
Eosinophils Absolute: 0 10*3/uL (ref 0.0–0.5)
Eosinophils Relative: 0 %
HCT: 31.5 % — ABNORMAL LOW (ref 39.0–52.0)
Hemoglobin: 10.5 g/dL — ABNORMAL LOW (ref 13.0–17.0)
Immature Granulocytes: 0 %
Lymphocytes Relative: 15 %
Lymphs Abs: 0.6 10*3/uL — ABNORMAL LOW (ref 0.7–4.0)
MCH: 25.1 pg — ABNORMAL LOW (ref 26.0–34.0)
MCHC: 33.3 g/dL (ref 30.0–36.0)
MCV: 75.4 fL — ABNORMAL LOW (ref 80.0–100.0)
Monocytes Absolute: 0.7 10*3/uL (ref 0.1–1.0)
Monocytes Relative: 21 %
Neutro Abs: 2.3 10*3/uL (ref 1.7–7.7)
Neutrophils Relative %: 63 %
Platelets: 155 10*3/uL (ref 150–400)
RBC: 4.18 MIL/uL — ABNORMAL LOW (ref 4.22–5.81)
RDW: 20.6 % — ABNORMAL HIGH (ref 11.5–15.5)
WBC: 3.6 10*3/uL — ABNORMAL LOW (ref 4.0–10.5)
nRBC: 0 % (ref 0.0–0.2)

## 2021-11-03 LAB — HEPATIC FUNCTION PANEL
ALT: 18 U/L (ref 0–44)
AST: 37 U/L (ref 15–41)
Albumin: 3.8 g/dL (ref 3.5–5.0)
Alkaline Phosphatase: 57 U/L (ref 38–126)
Bilirubin, Direct: 0.8 mg/dL — ABNORMAL HIGH (ref 0.0–0.2)
Indirect Bilirubin: 1.7 mg/dL — ABNORMAL HIGH (ref 0.3–0.9)
Total Bilirubin: 2.5 mg/dL — ABNORMAL HIGH (ref 0.3–1.2)
Total Protein: 7.9 g/dL (ref 6.5–8.1)

## 2021-11-03 LAB — BASIC METABOLIC PANEL
Anion gap: 12 (ref 5–15)
Anion gap: 12 (ref 5–15)
BUN: 42 mg/dL — ABNORMAL HIGH (ref 8–23)
BUN: 41 mg/dL — ABNORMAL HIGH (ref 8–23)
CO2: 27 mmol/L (ref 22–32)
CO2: 26 mmol/L (ref 22–32)
Calcium: 9.6 mg/dL (ref 8.9–10.3)
Calcium: 9.7 mg/dL (ref 8.9–10.3)
Chloride: 83 mmol/L — ABNORMAL LOW (ref 98–111)
Chloride: 84 mmol/L — ABNORMAL LOW (ref 98–111)
Creatinine, Ser: 2.12 mg/dL — ABNORMAL HIGH (ref 0.61–1.24)
Creatinine, Ser: 1.9 mg/dL — ABNORMAL HIGH (ref 0.61–1.24)
GFR, Estimated: 32 mL/min — ABNORMAL LOW (ref >60)
GFR, Estimated: 37 mL/min — ABNORMAL LOW (ref >60)
Glucose, Bld: 94 mg/dL (ref 70–99)
Glucose, Bld: 64 mg/dL — ABNORMAL LOW (ref 70–99)
Potassium: 4.5 mmol/L (ref 3.5–5.1)
Potassium: 4.1 mmol/L (ref 3.5–5.1)
Sodium: 122 mmol/L — ABNORMAL LOW (ref 135–145)
Sodium: 122 mmol/L — ABNORMAL LOW (ref 135–145)

## 2021-11-03 LAB — PROTIME-INR
INR: 2 — ABNORMAL HIGH (ref 0.8–1.2)
Prothrombin Time: 23 seconds — ABNORMAL HIGH (ref 11.4–15.2)

## 2021-11-03 MED ORDER — ASPIRIN EC 81 MG PO TBEC
81.0000 mg | DELAYED_RELEASE_TABLET | Freq: Every morning | ORAL | Status: DC
  Administered 2021-11-04 – 2021-11-09 (×6): 81 mg via ORAL
  Filled 2021-11-03 (×6): qty 1

## 2021-11-03 MED ORDER — WARFARIN SODIUM 2.5 MG PO TABS
2.5000 mg | ORAL_TABLET | Freq: Once | ORAL | Status: AC
  Administered 2021-11-03: 2.5 mg via ORAL
  Filled 2021-11-03: qty 1

## 2021-11-03 MED ORDER — FUROSEMIDE 10 MG/ML IJ SOLN
80.0000 mg | Freq: Two times a day (BID) | INTRAMUSCULAR | Status: DC
  Administered 2021-11-03 – 2021-11-07 (×8): 80 mg via INTRAVENOUS
  Filled 2021-11-03 (×8): qty 8

## 2021-11-03 MED ORDER — MACITENTAN 10 MG PO TABS
10.0000 mg | ORAL_TABLET | Freq: Every day | ORAL | Status: DC
  Administered 2021-11-04 – 2021-11-09 (×6): 10 mg via ORAL
  Filled 2021-11-03 (×6): qty 1

## 2021-11-03 MED ORDER — CEPHALEXIN 250 MG PO CAPS: 500.0000 mg | ORAL_CAPSULE | Freq: Two times a day (BID) | ORAL | Status: DC

## 2021-11-03 MED ORDER — SODIUM CHLORIDE 0.9% FLUSH: 3.0000 mL | INTRAVENOUS | Status: DC | PRN

## 2021-11-03 MED ORDER — SELEXIPAG 200 MCG PO TABS
200.0000 ug | ORAL_TABLET | Freq: Every day | ORAL | Status: DC
  Administered 2021-11-03 – 2021-11-08 (×6): 200 ug via ORAL
  Filled 2021-11-03 (×7): qty 1

## 2021-11-03 MED ORDER — MIDODRINE HCL 5 MG PO TABS
15.0000 mg | ORAL_TABLET | Freq: Three times a day (TID) | ORAL | Status: DC
  Administered 2021-11-04 – 2021-11-09 (×17): 15 mg via ORAL
  Filled 2021-11-03 (×18): qty 3

## 2021-11-03 MED ORDER — ACETAMINOPHEN 500 MG PO TABS
500.0000 mg | ORAL_TABLET | Freq: Four times a day (QID) | ORAL | Status: DC | PRN
Start: 2021-11-03 — End: 2021-11-09
  Administered 2021-11-03 – 2021-11-08 (×13): 1000 mg via ORAL
  Filled 2021-11-03 (×13): qty 2

## 2021-11-03 MED ORDER — SODIUM CHLORIDE 0.9% FLUSH
3.0000 mL | Freq: Two times a day (BID) | INTRAVENOUS | Status: DC
  Administered 2021-11-03 – 2021-11-07 (×6): 3 mL via INTRAVENOUS

## 2021-11-03 MED ORDER — DAPAGLIFLOZIN PROPANEDIOL 10 MG PO TABS
10.0000 mg | ORAL_TABLET | Freq: Every day | ORAL | Status: DC
  Administered 2021-11-04 – 2021-11-09 (×6): 10 mg via ORAL
  Filled 2021-11-03 (×6): qty 1

## 2021-11-03 MED ORDER — MACITENTAN 10 MG PO TABS: 10.0000 mg | ORAL_TABLET | Freq: Every day | ORAL | Status: DC

## 2021-11-03 MED ORDER — SELEXIPAG 400 MCG PO TABS
400.0000 ug | ORAL_TABLET | Freq: Every day | ORAL | Status: DC
  Administered 2021-11-04 – 2021-11-09 (×6): 400 ug via ORAL
  Filled 2021-11-03 (×7): qty 1

## 2021-11-03 MED ORDER — ROSUVASTATIN CALCIUM 5 MG PO TABS
5.0000 mg | ORAL_TABLET | Freq: Every day | ORAL | Status: DC
  Administered 2021-11-04 – 2021-11-09 (×6): 5 mg via ORAL
  Filled 2021-11-03 (×6): qty 1

## 2021-11-03 MED ORDER — TOLVAPTAN 15 MG PO TABS
15.0000 mg | ORAL_TABLET | Freq: Once | ORAL | Status: DC
  Administered 2021-11-03: 15 mg via ORAL
  Filled 2021-11-03: qty 1

## 2021-11-03 MED ORDER — SODIUM CHLORIDE 0.9 % IV SOLN: 250.0000 mL | INTRAVENOUS | Status: DC | PRN

## 2021-11-03 MED ORDER — SPIRONOLACTONE 25 MG PO TABS
25.0000 mg | ORAL_TABLET | Freq: Every day | ORAL | Status: DC
  Administered 2021-11-04 – 2021-11-09 (×6): 25 mg via ORAL
  Filled 2021-11-03 (×6): qty 1

## 2021-11-03 MED ORDER — WARFARIN - PHARMACIST DOSING INPATIENT: Freq: Every day | Status: DC

## 2021-11-03 MED ORDER — TADALAFIL (PAH) 20 MG PO TABS
40.0000 mg | ORAL_TABLET | Freq: Every day | ORAL | Status: DC
  Administered 2021-11-04 – 2021-11-09 (×6): 40 mg via ORAL
  Filled 2021-11-03 (×6): qty 2

## 2021-11-03 NOTE — Progress Notes (Signed)
Pharmacy Consult for Pulmonary Hypertension Treatment   Indication - Continuation of prior to admission medication   Patient is 75 y.o.  with history of PAH on chronic Macitentan (Opsumit) PTA and will be continued while hospitalized.   Continuing this medication order as an inpatient requires that monitoring parameters per REMS requirements must be met.  Chronic therapy is under the supervision of Dr Haroldine Laws, who is enrolled in the REMS program and is being notified of continuation of therapy. A staff message in EPIC has been sent notifying the certified prescriber.  Per patient report has previously been educated on Hepatotoxicity . On admission pregnancy risk has been assessed and no monitoring required. Hepatic function has been evaluated. AST/ALT appropriate to continue medication at this time.  Hepatic Function Latest Ref Rng & Units 09/29/2021 08/16/2021 07/04/2021  Total Protein 6.5 - 8.1 g/dL 7.6 8.2 8.6(H)  Albumin 3.5 - 5.0 g/dL 3.5 4.3 4.3  AST 15 - 41 U/L 36 28 33  ALT 0 - 44 U/L _0 Alk Phosphatase 38 - 126 U/L 60 65 83  Total Bilirubin 0.3 - 1.2 mg/dL 2.2(H) 2.0(H) 1.9(H)  Bilirubin, Direct 0.0 - 0.2 mg/dL 0.7(H) - -    If any question arise or pregnancy is identified during hospitalization, contact for bosentan and macitentan: [phone]; ambrisentan: [phone].   Bonnita Nasuti Pharm.D. CPP, BCPS Clinical Pharmacist [phone] 11/03/2021 6:20 PM

## 2021-11-03 NOTE — Progress Notes (Signed)
ANTICOAGULATION CONSULT NOTE - Initial Consult  Pharmacy Consult for warfarin Indication: atrial fibrillation and mechanical AVR  Allergies  Allergen Reactions   Pantoprazole Sodium Nausea Only    Gets gassy and starting itching like crazy Gets gassy and starting itching like crazy   Valium [Diazepam] Other (See Comments)    HA and Abd pain.   Tape Rash and Other (See Comments)    Surgical tape   Wound Dressing Adhesive Other (See Comments) and Rash    Surgical tape Surgical tape Surgical tape    Patient Measurements:   Vital Signs: Temp: 97.3 F (36.3 C) (11/10 1641) Temp Source: Oral (11/10 1641) BP: 107/76 (11/10 1641) Pulse Rate: 61 (11/10 1641)  Labs: Recent Labs    11/02/21 1432 11/03/21 1205 11/03/21 1821  HGB  --   --  10.5*  HCT  --   --  31.5*  PLT  --   --  155  LABPROT  --   --  23.0*  INR  --   --  2.0*  CREATININE 1.59* 2.12*  --     Estimated Creatinine Clearance: 27.9 mL/min (A) (by C-G formula based on SCr of 2.12 mg/dL (H)).   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 In light of concurrent left nephrolithiasis, will check CT renal colic next year instead of renal US.    Anticoagulated on Coumadin 01/04/2018   Anxiety 05/10/2016   Last Assessment & Plan:  Doing well off of zoloft.   Asthma    Asymptomatic microscopic hematuria 06/20/2017   Last Assessment & Plan:  Had hematuria workup in [facility] in 2016 which negative CT and cystoscopy. UA with 2+ blood last visit - we discussed recommendation for repeat workup at 5 years or if degree of hematuria progresses.    Atrial fibrillation ([place]) 03/29/2017   Atrial flutter ([place]) 03/29/2017   Backache 12/14/2015   Last Assessment & Plan:  Pain management referral for further evaluation.   Bilateral  pleural effusion 08/09/2017   CHF (congestive heart failure) (HCC)    Chronic allergic rhinitis 04/28/2016   Last Assessment & Plan:  Continue astelin   Chronic anticoagulation 03/29/2017   Chronic atrial fibrillation ([place]) 07/23/2015   Last Assessment & Plan:  Coumadin and metoprolol, cardiology referral to establish care.   Chronic midline back pain 12/14/2015   Last Assessment & Plan:  Pain management referral for further evaluation.   Chronic obstructive lung disease ([place]) 06/12/2016   With hypoxia   Chronic prostatitis 07/23/2015   Last Assessment & Plan:  Has largely resolved since stopping bike riding. Recommend annual DRE AND PSA - will see back 12/2015 for annual screening, given 1st degree fhx. To call office for recurrent prostatitis symptoms.    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 If symptoms worsen or fail to improve by 7-10d, delayed ATBs, fluids, rest, NSAIDs/APAP prn. Seek care if not improving. Needs  earlier INR check due to ATBs.   Dyspnea 02/01/2016   Last Assessment & Plan:  Overall improving, eval by pulm, plan for CT, neg stress test with cardiology. Recent switch to carvedilol due to side effects.   Encounter for therapeutic drug monitoring 01/06/2019   Enterococcal bacteremia    Epidermoid cyst of skin 08/24/2017   Essential hypertension 12/14/2015   Last Assessment & Plan:  Hypertension control: controlled  Medications: compliant Medication Management: as noted in orders Home blood pressure monitoring recommended additionally as needed for symptoms  The patient's care plan was reviewed  and updated. Instructions and counseling were provided regarding patient goals and barriers. He was counseled to adopt a healthy lifestyle. Educational resources and self-management tools have been provided as charted in [facility] list.    H/O maze procedure 03/29/2017   [name] 03/29/2017   Overview:  2011   History of coronary artery bypass graft 03/29/2017   Hx of CABG 03/29/2017   Hyperlipidemia 03/29/2017   Hypersensitivity angiitis ([place]) 10/01/2017   Hypertensive heart disease 03/29/2017   Hypertensive heart disease with heart failure ([place]) 03/29/2017   Hypertensive heart failure ([place]) 03/29/2017   Hypokalemia due to excessive renal loss of potassium 02/18/2018   Hypotension, chronic 06/06/2018   International normalized ratio (INR) raised 07/26/2017   Kidney stone 07/23/2015   Kidney stones 07/23/2015   Overview:  x 3  Last Assessment & Plan:  By [place] has left nephrolithiasis, but not visible by KUB. Will check CT renal colic next year to assess both stone burden as well as to surveil AML.    Left ureteral stone 01/23/2018   Leukocytoclastic vasculitis ([place]) 10/01/2017   Localized edema 01/04/2018   Long term (current) use of anticoagulants 03/29/2017   Lumbar radicular pain 01/19/2016   Lumbar radiculopathy 01/19/2016   Maculopapular rash 09/03/2017   Microscopic hematuria 06/20/2017   Last Assessment & Plan:  [facility] which negative CT and cystoscopy. UA with 2+ blood last visit - we discussed recommendation for repeat workup at 5 years or if degree of hematuria progresses.    Multiple nodules of lung 06/12/2016   Nasal discharge 02/25/2016   Last Assessment & Plan:  Trial zyrtec and flonase   Nephrolithiasis 07/23/2015   Overview:  x 3  Last Assessment & Plan:  By [place] has left nephrolithiasis, but not visible by KUB. Will check CT renal colic next year to assess both stone burden as well as to surveil AML.   Overview:  x 3  Last Assessment & Plan:  Has 71m  nonobstructing LUP stone - not visible by KUB.  Will check renal [place] - he will contact office sooner if symptomatic.    Non-sustained ventricular tachycardia 03/29/2017   Nonsustained ventricular tachycardia 03/29/2017   Other hyperlipidemia 03/29/2017   Palpitations 10/01/2017   Pleural effusion, bilateral 08/09/2017   Pneumonia due to COVID-19 virus 02/07/2021   Post-nasal drainage 02/25/2016   Last Assessment & Plan:  Trial zyrtec and flonase   Prostate cancer screening 06/20/2017   Last Assessment & Plan:  Recommend continued annual CaP screening until within 10 years of life expectancy. Given good health and fhx of longevity, would anticipate CaP screening to continue until age 75  PSA today and again in one year on day of visit.   Pulmonary arterial hypertension ([place] 02/20/2018   Pulmonary edema    [place]ulmonary hypertension [place] 08/09/2017   Pulmonary nodules 06/12/2016   S/P  AVR 03/20/2016   S/P AVR (aortic valve replacement) 03/20/2016   Strain of deltoid muscle, initial encounter    Supratherapeutic INR 07/26/2017   Syncope and collapse 02/01/2016   Typical atrial flutter ([place]) 02/01/2016    Assessment: 75yom with [place] EF 45% admitted with volume overload. Na 122 tolvaptan added to IV furosemide.   Admit INR 2.0 < goal CBC stable  On warfarin PTA 41m MWF, 2.579mTTSS  Goal of Therapy:  INR 2.5-3 Monitor platelets by anticoagulation protocol: Yes   Plan:  Warfarin 2.28m56m1 - usual dose  Daily Protime    LisBonnita Nasutiarm.D. CPP, BCPS Clinical Pharmacist [phone]/09/2021 7:49 PM

## 2021-11-03 NOTE — Plan of Care (Signed)
  Problem: Education: Goal: Knowledge of General Education information will improve Description: Including pain rating scale, medication(s)/side effects and non-pharmacologic comfort measures Outcome: Progressing   Problem: Clinical Measurements: Goal: Respiratory complications will improve Outcome: Progressing

## 2021-11-03 NOTE — H&P (Addendum)
Advanced Heart Failure Team History and Physical Note   PCP:  [name], PA-C  PCP-Cardiology: Dr. 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 Also has COPD (quit smoking in 2002 - 1 ppd x 25 years), HL, and HTN.   In 8/18 admitted for acute gallstone pancreatitis. Underwent lap cholecystectomy on 07/29/2017. On 8/6 he developed hypotension and was taken back for open laparotomy due to bleeding from an unclear omental source. He was given fluids and 8 units PRBC perioperatively. He says his breathing has been bad since that time.    Seen at [facility] in 2018 had hi-res CT, PFTs and VQ (low prob). Felt to have Golds I COPD with asthma overlap and early pulmonary fibrosis with PAH. He was also seen by Cardiology. Echo 8/18 as below showed normal LV function with septal flattening and RVSP 65-26mHG. 14-day event monitor showed 10-beat run NSVT. Felt to have chronic AF and PAH. No further testing ordered.   Underwent RHC on 01/18/18 with severe PAH and cor pulmonale. Macitentan and Adcirca. Subsequently started on selexipag but dose limited by cramping now on 400/200   Admitted 5/19 with symptomatic hypotension and weight gain. He had a repeat RHC and echo with bubble study (results below). He was started on midodrine for BP support. Diuresed 25 lbs with IV lasix, then transitioned to torsemide 60 mg daily. Abdominal UKoreashowed cirrhosis with no evidence of portal HTN. Pulm consulted for possible ILD on High-res chest CT.   Seen in ED on 01/27/21 due to hypotension due  to COVID and diarrhea. He recovered. He had stable NYHA II symptoms at follow up 4/22, repeat echo scheduled (see results below).    [facility], profound fatigue. [facility]. On doxy for 5 weeks.    Acute visit 08/02/21 feeling poorly, NYHA III symptoms, volume ok. Suspected worsening anxiety driving symptoms.     Seen 09/12/21 with NYHA IIIb-IV symptoms. ReDs 38%. Given 80 m g IV lasix + metolazone 2.5 in clinic. Then arranged for 80 mg IV lasix x 2 days with [facility]. He was seen during his lab appt and he was some better but still had LE edema so we did one more dose of 80 mg IV lasix with [facility], last dose given 09/21/21.   Admitted from the cath lab on 09/29/21 with elevated filling pressures. RHC with moderate PAH and elevated filling pressures. Started on IV lasix then transitioned to torsemide. Torsemide held due to creatinine bump with plans to restart 3 days after discharge. INR goal lowered due to ongoing severe ecchymosis/purpura. Hospitalization c/b hyponatremia requiring tolvaptan. Weight 143 lbs.   Had post hospital HF on 10/21 and still w/ mild fluid overload and FWilder Gladewas added. Instructed to return in 10 days for f/u BMP. Presented yesterday for labs and BMP showed hyponatremia w/ Na 121. Instructed to return for repeat. Today he complained of feeling worse and added on to provider clinic as work-in.    He reports running out of his torsemide and metolazone 4 days ago and had issues obtaining refills from the pharmacy. Reports increased LEE  and dyspnea. Wt up 4 lb today. ReDs Clip elevated at 41%. NYHA Class III + fatigue. Repeat BMP shows Na 122.  Scr 2.12 (up from 1.6). K 4.5.    D/w Dr. Haroldine Laws, will direct admit for a/c HF and hyponatremia.      Review of Systems: [y] = yes, _0  = no   General: Weight gain _1 ; Weight loss _2 ; Anorexia _3 ; Fatigue [ Y]; Fever _4 ; Chills _5 ; Weakness _6   Cardiac: Chest pain/pressure _7 ; Resting SOB _8 ; Exertional SOB [ Y]; Orthopnea _9 ; Pedal Edema [Y ]; Palpitations _10 ; Syncope _11 ;  Presyncope _12 ; Paroxysmal nocturnal dyspnea_13   Pulmonary: Cough _14 ; Wheezing_15 ; Hemoptysis_16 ; Sputum _17 ; Snoring _18   GI: Vomiting_19 ; Dysphagia_20 ; Melena_21 ; Hematochezia _22 ; Heartburn_23 ; Abdominal pain _24 ; Constipation _25 ; Diarrhea _26 ; BRBPR _27   GU: Hematuria_28 ; Dysuria _29 ; Nocturia_30   Vascular: Pain in legs with walking _31 ; Pain in feet with lying flat _32 ; Non-healing sores _33 ; Stroke _34 ; TIA _35 ; Slurred speech _36 ;  Neuro: Headaches_37 ; Vertigo_38 ; Seizures_39 ; Paresthesias_40 ;Blurred vision _41 ; Diplopia _42 ; Vision changes _43   Ortho/Skin: Arthritis _44 ; Joint pain _45 ; Muscle pain _46 ; Joint swelling _47 ; Back Pain _48 ; Rash _49   Psych: Depression_50 ; Anxiety_51   Heme: Bleeding problems _52 ; Clotting disorders _53 ; Anemia _54   Endocrine: Diabetes _55 ; Thyroid dysfunction_56    Home Medications Prior to Admission medications   Medication Sig Start Date End Date Taking? Authorizing Provider  acetaminophen (TYLENOL) 500 MG tablet Take 500-1,000 mg by mouth every 6 (six) hours as needed (pain.).    [provider]  albuterol (VENTOLIN HFA) 108 (90 Base) MCG/ACT inhaler Inhale 2 puffs into the lungs every 6 (six) hours as needed for wheezing or shortness of breath. 08/04/21   Collene Gobble, MD  ammonium lactate (LAC-HYDRIN) 12 % lotion Apply 1 application topically as needed for dry skin.    [provider]  Ascorbic Acid (VITAMIN C GUMMIE PO) Take 2 tablets by mouth daily at 12 noon.    [provider]  aspirin EC 81 MG tablet Take 81 mg by mouth in the morning.    [provider]  cephALEXin (KEFLEX) 500 MG capsule Take 1 capsule (500 mg total) by mouth 2 (two) times daily. 10/12/21   Saguier, Percell Miller, PA-C  Cholecalciferol (VITAMIN D3) 50 MCG (2000 UT) TABS Take 2,000 Units by mouth daily at 12 noon.    [provider]  Coenzyme Q10-Vitamin E (QUNOL ULTRA COQ10 PO) Take 1 capsule by mouth in the morning.    [provider]  dapagliflozin propanediol (FARXIGA) 10 MG TABS tablet Take 1 tablet (10 mg total) by mouth daily before breakfast. 10/14/21   Milford, Maricela Bo, FNP  famotidine-calcium carbonate-magnesium hydroxide (PEPCID COMPLETE) 10-800-165 MG chewable tablet Chew 2 tablets by mouth daily as needed (indigestion/heartburn).    [provider]  KRILL OIL PO Take 800 mg by mouth every other day. In the morning    [provider]  Magnesium 200 MG CHEW Chew 400 mg by mouth daily at 12 noon.    [provider]  metolazone (ZAROXOLYN) 2.5 MG tablet TAKE 1 TABLET(2.5 MG) BY MOUTH DAILY AS NEEDED FOR FLUID RETENTION 06/23/21   Khaylee Mcevoy, Shaune Pascal,  MD  midodrine (PROAMATINE) 5 MG tablet Take 3 tablets (15 mg total) by mouth 3 (three) times daily with meals. 10/28/21   Caroline Longie, Shaune Pascal, MD  OPSUMIT 10 MG tablet TAKE 1 TABLET DAILY 09/26/21   Dmarco Baldus, Shaune Pascal, MD  potassium chloride (KLOR-CON) 10 MEQ tablet Take 4 tablets (40 mEq total) by mouth 2 (two) times daily. Please dispense 10 meq tablets (take 40 meq bid) 11/02/21   Cyndal Kasson, Shaune Pascal, MD  rosuvastatin (CRESTOR) 5 MG tablet TAKE 1 TABLET(5 MG) BY MOUTH DAILY 10/05/21   Khilynn Borntreger, Shaune Pascal, MD  Selexipag (UPTRAVI) 400 MCG TABS Take 400 mcg by mouth in the morning.    [provider]  spironolactone (ALDACTONE) 25 MG tablet Take 1 tablet (25 mg total) by mouth daily. 11/02/21   Zaelyn Barbary, Shaune Pascal, MD  tadalafil, PAH, (ADCIRCA) 20 MG tablet Take 40 mg by mouth in the morning.    [provider]  TART CHERRY PO Take 2 tablets by mouth daily at 12 noon. HumanN Ackley by [employer], Historical, MD  torsemide (DEMADEX) 20 MG tablet Take 2 tablets (40 mg total) by mouth 2 (two) times daily. 11/02/21   Nysir Fergusson, Shaune Pascal, MD  Turmeric 500 MG TABS Take 1 capsule by mouth daily in the afternoon. With Ginger 50 mg    [provider]  UPTRAVI 200 MCG TABS TAKE 1 TABLET (200 MCG  TOTAL) BY MOUTH EVERY EVENING. 10/28/21   Larey Dresser, MD  vitamin B-12 (CYANOCOBALAMIN) 1000 MCG tablet Take 1,000 mcg by mouth daily at 12 noon.    [provider]  warfarin (COUMADIN) 5 MG tablet TAKE 1/2 DAILY DAILY AS DIRECTED BY COUMADIN CLINIC 10/05/21   Clegg, Amy D, NP  Zinc 50 MG TABS Take 50 mg by mouth every other day. 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 In light of concurrent left nephrolithiasis, will check CT renal colic next year instead of renal US.    Anticoagulated on Coumadin 01/04/2018   Anxiety 05/10/2016   Last Assessment & Plan:  Doing well off of zoloft.   Asthma    Asymptomatic microscopic hematuria 06/20/2017   Last Assessment & Plan:  Had hematuria workup in [facility] in [facility] which negative CT and cystoscopy. UA with 2+ blood last visit - we discussed recommendation for repeat workup at 5 years or if degree of hematuria progresses.    Atrial fibrillation ([place]) 03/29/2017   Atrial flutter ([place]) 03/29/2017   Backache 12/14/2015   Last Assessment & Plan:  Pain management referral for further evaluation.   Bilateral pleural effusion 08/09/2017   CHF (congestive heart failure) (HCC)    Chronic allergic rhinitis 04/28/2016   Last Assessment & Plan:  Continue astelin   Chronic anticoagulation 03/29/2017   Chronic atrial fibrillation ([place]) 07/23/2015   Last Assessment & Plan:  Coumadin and metoprolol, cardiology referral to establish care.   Chronic midline back pain 12/14/2015   Last Assessment & Plan:  Pain management referral for further evaluation.   Chronic obstructive lung disease ([place]) 06/12/2016   With hypoxia   Chronic prostatitis 07/23/2015   Last Assessment & Plan:  Has largely resolved since  stopping bike riding. Recommend annual DRE AND PSA - will see back 12/2015 for annual screening, given 1st degree fhx. To call office for recurrent prostatitis symptoms.    Chronic respiratory failure with hypoxia ([place]) 07/29/2019   Cirrhosis of liver not due to alcohol ([place]) 07/01/2019   COPD (chronic obstructive pulmonary disease) ([place]) 06/12/2016   Coronary arteriosclerosis in native artery 03/29/2017   Coronary artery disease involving native coronary artery of native heart with angina pectoris ([place]) 03/29/2017   Cough 10/17/2016   Last Assessment & Plan:  Discussed typical course for acute viral illness. If symptoms worsen or fail to improve by 7-10d, delayed ATBs, fluids, rest, NSAIDs/APAP prn. Seek care if not improving. Needs earlier INR check due to ATBs.   Dyspnea 02/01/2016   Last Assessment & Plan:  Overall improving, eval by pulm, plan for CT, neg stress test with cardiology. Recent switch to carvedilol due to side effects.   Encounter for therapeutic drug monitoring 01/06/2019   Enterococcal bacteremia    Epidermoid cyst of skin 08/24/2017   Essential hypertension 12/14/2015   Last Assessment & Plan:  Hypertension control: controlled  Medications: compliant Medication Management: as noted in orders Home blood pressure monitoring recommended additionally as needed for symptoms  The patient's care plan was reviewed and updated. Instructions and counseling were provided regarding patient goals and barriers. He was counseled to adopt a healthy lifestyle. Educational resources and self-management tools have been provided as charted in [facility] list.    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 Will check CT renal colic next year to assess both stone burden as well as to surveil AML.    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 UA with 2+ blood last visit - we discussed recommendation for repeat workup at 5 years or if degree of hematuria progresses.    Multiple nodules of lung 06/12/2016   Nasal discharge 02/25/2016   Last Assessment & Plan:  Trial zyrtec and flonase   Nephrolithiasis 07/23/2015   Overview:  x 3  Last Assessment & Plan:  By [place] has left nephrolithiasis, but not visible by KUB. Will check CT renal colic next year to assess both stone burden as well as to surveil AML.   Overview:  x 3  Last Assessment & Plan:  Has 76m nonobstructing LUP stone - not visible by KUB.  Will check renal [place]/2019 - he will contact office sooner if symptomatic.    Non-sustained ventricular tachycardia 03/29/2017   Nonsustained ventricular  tachycardia 03/29/2017   Other hyperlipidemia 03/29/2017   Palpitations 10/01/2017   Pleural effusion, bilateral 08/09/2017   Pneumonia due to COVID-19 virus 02/07/2021   Post-nasal drainage 02/25/2016   Last Assessment & Plan:  Trial zyrtec and flonase   Prostate cancer screening 06/20/2017   Last Assessment & Plan:  Recommend continued annual CaP screening until within 10 years of life expectancy.  Given good health and fhx of longevity, would anticipate CaP screening to continue until age 27.  PSA today and again in one year on day of visit.   Pulmonary arterial hypertension ([place]) 02/20/2018   Pulmonary edema    Pulmonary hypertension ([place]) 08/09/2017   Pulmonary nodules 06/12/2016   S/P AVR 03/20/2016   S/P AVR (aortic valve replacement) 03/20/2016   Strain of deltoid muscle, initial encounter    Supratherapeutic INR 07/26/2017   Syncope and collapse 02/01/2016   Typical atrial flutter ([place]) 02/01/2016    Past Surgical History: Past Surgical History:  Procedure Laterality Date   CHOLECYSTECTOMY     CORONARY ARTERY BYPASS GRAFT     EXPLORATORY LAPAROTOMY  07/30/2017   FOOT SURGERY     FRACTURE SURGERY Right    wrist and forearm   HERNIA REPAIR     MECHANICAL AORTIC VALVE REPLACEMENT     NASAL SINUS SURGERY     RIGHT HEART CATH N/A 01/18/2018   Procedure: RIGHT HEART CATH;  Surgeon: Jolaine Artist, MD;  Location: [place] [facility];  Service: Cardiovascular;  Laterality: N/A;   RIGHT HEART CATH N/A 05/14/2018   Procedure: RIGHT HEART CATH;  Surgeon: Jolaine Artist, MD;  Location: [place] [facility];  Service: Cardiovascular;  Laterality: N/A;   RIGHT HEART CATH N/A 09/29/2021   Procedure: RIGHT HEART CATH;  Surgeon: Jolaine Artist, MD;  Location: [place] [facility];  Service: Cardiovascular;  Laterality: N/A;   TEE WITHOUT CARDIOVERSION N/A 05/21/2018   Procedure: TRANSESOPHAGEAL ECHOCARDIOGRAM (TEE);  Surgeon: Jolaine Artist, MD;  Location[facility] ENDOSCOPY;  Service: Cardiovascular;  Laterality: N/A;   UPPER GASTROINTESTINAL ENDOSCOPY  07/12/2017   Patchy areas of mucosal inflammation noted in the antrum with edema,erthema and ulcerations. Bx. Chronicfocally active gastritis.   VASECTOMY      Family History:  Family History  Problem Relation Age of Onset   Asthma Mother    Arthritis Mother    Heart attack Father    Hypertension Father    Stroke Paternal  Grandmother    Colon cancer Neg Hx     Social History: Social History   Socioeconomic History   Marital status: Single    Spouse name: Not on file   Number of children: Not on file   Years of education: Not on file   Highest education level: Not on file  Occupational History   Not on file  Tobacco Use   Smoking status: Former    Packs/day: 2.00    Years: 34.00    Pack years: 68.00    Types: Cigarettes    Quit date: 07/16/2000    Years since quitting: 21.3   Smokeless tobacco: Never  Vaping Use   Vaping Use: Never used  Substance and Sexual Activity   Alcohol use: Not Currently   Drug use: No   Sexual activity: Not on file  Other Topics Concern   Not on file  Social History Narrative   Not on file   Social Determinants of Health   Financial Resource Strain: Low Risk    Difficulty  of Paying Living Expenses: Not very hard  Food Insecurity: No Food Insecurity   Worried About [place] in the Last Year: Never true   Ran Out of Food in the Last Year: Never true  Transportation Needs: No Transportation Needs   Lack of Transportation (Medical): No   Lack of Transportation (Non-Medical): No  Physical Activity: Not on file  Stress: Not on file  Social Connections: Not on file    Allergies:  Allergies  Allergen Reactions   Pantoprazole Sodium Nausea Only    Gets gassy and starting itching like crazy Gets gassy and starting itching like crazy   Valium [Diazepam] Other (See Comments)    HA and Abd pain.   Tape Rash and Other (See Comments)    Surgical tape   Wound Dressing Adhesive Other (See Comments) and Rash    Surgical tape Surgical tape Surgical tape    Objective:    Vital Signs:   BP: ()/()  Arterial Line BP: ()/()    There were no vitals filed for this visit.   Physical Exam     ReDs Clip 41%  General:  fatigue appearing. No respiratory difficulty HEENT: normal Neck: supple. JVD to jaw. Carotids 2+ bilat; no bruits. No lymphadenopathy  or thyromegaly appreciated. Cor: PMI nondisplaced. Regular rate & rhythm. 2/6 SEM  Lungs: decreased BS at the bases bilaterally  Abdomen: soft, nontender, nondistended. No hepatosplenomegaly. No bruits or masses. Good bowel sounds. Extremities: no clubbing, rash, 1+ bilateral LEE, + ted hoses  Neuro: alert & oriented x 3, cranial nerves grossly intact. moves all 4 extremities w/o difficulty. Affect pleasant.       Telemetry   N/A   EKG   Admit 12 lead pending   Labs     Basic Metabolic Panel: Recent Labs  Lab 11/02/21 1432 11/03/21 1205  NA 121* 122*  K 3.6 4.5  CL 81* 83*  CO2 29 27  GLUCOSE 97 94  BUN 38* 42*  CREATININE 1.59* 2.12*  CALCIUM 9.2 9.6    Liver Function Tests: No results for input(s): AST, ALT, ALKPHOS, BILITOT, PROT, ALBUMIN in the last 168 hours. No results for input(s): LIPASE, AMYLASE in the last 168 hours. No results for input(s): AMMONIA in the last 168 hours.  CBC: No results for input(s): WBC, NEUTROABS, HGB, HCT, MCV, PLT in the last 168 hours.  Cardiac Enzymes: No results for input(s): CKTOTAL, CKMB, CKMBINDEX, TROPONINI in the last 168 hours.  BNP: BNP (last 3 results) Recent Labs    09/12/21 1238 09/27/21 1546 09/30/21 0241  BNP 1,141.4* 953.2* 840.7*    ProBNP (last 3 results) Recent Labs    07/04/21 1607 08/16/21 1620  PROBNP 425.0* 601.0*     CBG: No results for input(s): GLUCAP in the last 168 hours.  Coagulation Studies: No results for input(s): LABPROT, INR in the last 72 hours.  Imaging: No results found.   Assessment/Plan   1. Acute on Chronic Combined Systolic and Diastolic HF - Admitted 96/78/93 for IV diuresis after moderately elevated filling pressures on RHC.  - Echo 10/22 EF 45-50% RV low normal  - Stable NYHA III. Volume overloaded after running out of diuretics x 4 days, c/b AKI and hyponatremia - Direct admit. IV Lasix 80 mg bid + Tolvaptan  - Continue Farxiga 10 mg daily. - Continue spiro  25 mg daily (monitor SCr/K). - off ? blocker due to h/o bradycardia/pauses - no ARB/ARNi w/ CKD  - Fluid Restrict - Follow  BMP    2. Hyponatremia - Hypervolemic Hyponatremia  - NA 122 - Tolvaptan 15 mg x 1  - Diuresis per above  - Fluid restrict    3. AKI on CKD, Stage IIIa  - Baseline SCr 1.5-1.8 - SCr 2.12 on admit - Suspect cardiorenal  - Monitor w/ diuresis    4. Pulmonary hypertension with cor pulmonale/RV failure - WHO Group I & II - Auto-immune serologies negative (checked twice). - ? Component of HHT/shunt/AVMs with late bubbles on bubble study.  - Echo with bubble study (05/16/18): EF 55-60% with "late bubbles" , Mechanical AVR stable with trivial perivalvular regurg, Mild MR, Severe LAE, Severe RV dilation and reduced function. No PFO. Mod TR, PA peak pressure 68 mm Hg - Echo (4/22): EF 50-55%, RV ok, severe RVSP 61.6 mmHg, severe biatrial enlargement, moderate TR, stable AVR, AV mean gradient 11 mmHg - Echo 10/22 EF 45-50 RV low normal  - RHC data as above - Ab u/s with cirrhosis but no evidence of portal HTN. - On triple therapy with Macitentan and Adcirca. Uptravi dose at 400/200 limited by cramping.  Numbers not c/w need for IV therapies   5. Chronic Respiratory Failure - High Rest CT 05/16/18 with "dependent basilar predominant patchy subpleural reticulation and ground-glass attenuation with the suggestion of minimal associated traction bronchiolectasis. No frank honeycombing. Findings may indicate an interstitial lung disease such as early usual interstitial pneumonia (UIP) or fibrotic phase nonspecific interstitial pneumonia (NSIP)." - Followed by Dr. Lamonte Sakai.   6. Chronic AFL/Pauses - Rate controlled. Now off metoprolol.  - Multiple pauses 2-3 sec during recent admit 10/22. - Keep off beta blocker.  - Continue coumadin with mechanical AVR. - INR followed by [facility] in [place].   7. Frequent PVCs - This may be contributing to HF - Repeat echo stable     8. CAD - s/p CABG 07/2017 with Dr Roderic Palau in [place].  - No s/s ischemia - Continue Crestor 5 mg daily (unable to tolerate atorva due to myalgias).  - Lipids followed by Dr. Bettina Gavia.   9. S/p mechanical AVR - Stable on Echo 4/22. - On coumadin. - Aware of need for SBE prophylaxis.   10. Leukocytoclastic vasculitis - F/u with Rheumatology.  - No changes.   11. Cirrhosis - US Abdomen RUQ 05/16/18 - s/p cholecystectomy. + hepatic cirrhosis. Mild ascites.  - Liver Doppler 05/16/18 - No hepatic, splenic, or portal venous thrombosis or occlusion. Mild ascites. - Continue midodrine 15 mg bid.    Lyda Jester, PA-C 11/03/2021, 2:09 PM  Advanced Heart Failure Team Pager [phone] (M-F; 7a - 5p)  Please contact [facility] for night-coverage after hours (4p -7a ) and weekends on [email or web address]   Patient seen and examined with the above-signed Advanced Practice Provider and/or Housestaff. I personally reviewed laboratory data, imaging studies and relevant notes. I independently examined the patient and formulated the important aspects of the plan. I have edited the note to reflect any of my changes or salient points. I have personally discussed the plan with the patient and/or family.  75 y/o male with PAH, chronic diastolic HF, mechanical AVR, cirrhosis and CAD s/p CABG. Recently admitted for volume overload and IV diuresis.   Presented to clinic today for unscheduled visit due to worsening HF in setting of missing hi torsemide x 4 days. Labs notable for Na 122.   General:  Chronically ill appearing. No resp difficulty HEENT: normal Neck: supple. JVP to ear with prominent v waves Carotids 2+  bilat; no bruits. No lymphadenopathy or thryomegaly appreciated. Cor: PMI nondisplaced. Irregular rate & rhythm. 2/6 SEM  large skin tear on left shoulder Lungs: clear Abdomen: soft, nontender, nondistended. No hepatosplenomegaly. No bruits or masses. Good bowel sounds. Extremities: + cyanosis,  tr-1+edema  + ecchymosis  Neuro: alert & orientedx3, cranial nerves grossly intact. moves all 4 extremities w/o difficulty. Affect pleasant  Admit for IV diuresis and treatment of hyponatremia. Long discussion about my concern for his worsening HF and concerning prognosis. Likely will need palliative care involvement in the near future. Will begin discussing goals of care.   Glori Bickers, MD  10:55 PM

## 2021-11-03 NOTE — Progress Notes (Signed)
Advanced Heart Failure Clinic Note   Date:  11/03/2021   PCP:  [name], PA-C  Pulmonology: Dr. Lamonte Sakai Cardiologist:  Dr. Bettina Gavia  HF Cardiologist: Dr. 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 Also has COPD (quit smoking in 2002 - 1 ppd x 25 years), HL, and HTN.  In 8/18 admitted for acute gallstone pancreatitis. Underwent lap cholecystectomy on 07/29/2017. On 8/6 he developed hypotension and was taken back for open laparotomy due to bleeding from an unclear omental source. He was given fluids and 8 units PRBC perioperatively. He says his breathing has been bad since that time.   Seen at [facility] in 2018 had hi-res CT, PFTs and VQ (low prob). Felt to have Golds I COPD with asthma overlap and early pulmonary fibrosis with PAH. He was also seen by Cardiology. Echo 8/18 as below showed normal LV function with septal flattening and RVSP 65-19mHG. 14-day event monitor showed 10-beat run NSVT. Felt to have chronic AF and PAH. No further testing ordered.  Underwent RHC on 01/18/18 with severe PAH and cor pulmonale. Macitentan and Adcirca. Subsequently started on selexipag but dose limited by cramping now on 400/200  Admitted 5/19 with symptomatic hypotension and weight gain. He had a repeat RHC and echo with bubble study (results below). He was started on midodrine for BP support. Diuresed 25 lbs with IV lasix, then transitioned to torsemide 60 mg daily. Abdominal UKoreashowed cirrhosis with no evidence of portal HTN. Pulm consulted for possible ILD on High-res chest CT.  Seen in ED on 01/27/21 due to hypotension due  to COVID and diarrhea. He recovered. He had stable NYHA II symptoms at follow up 4/22, repeat echo scheduled (see results below).  [facility], profound [facility]. [facility]  [facility] On doxy for 5 weeks.   Acute visit 08/02/21 feeling poorly, NYHA III symptoms, volume ok. Suspected worsening anxiety driving symptoms.    Seen 09/12/21 with NYHA IIIb-IV symptoms. ReDs 38%. Given 80 m g IV lasix + metolazone 2.5 in clinic. Then arranged for 80 mg IV lasix x 2 days with [facility]. He was seen during his lab appt and he was some better but still had LE edema so we did one more dose of 80 mg IV lasix with [facility], last dose given 09/21/21.   Admitted from the cath lab on 09/29/21 with elevated filling pressures. RHC with moderate PAH and elevated filling pressures. Started on IV lasix then transitioned to torsemide. Torsemide held due to creatinine bump with plans to restart 3 days after discharge. INR goal lowered due to ongoing severe ecchymosis/purpura. Hospitalization c/b hyponatremia requiring tolvaptan. Weight 143 lbs.   Had post hospital HF on 10/21 and still w/ mild fluid overload and FWilder [place] added. Instructed to return in 10 days for f/u BMP. Presented yesterday for labs and BMP showed hyponatremia w/ Na 121. Instructed to return for repeat. Today he complained of feeling worse and added on to provider clinic as work-in.   He reports running out of his torsemide and metolazone 4 days ago and had issues obtaining refills from the pharmacy. Reports increased LEE and dyspnea. Wt up 4 lb today. ReDs Clip elevated at 41%. NYHA Class III + fatigue. Repeat BMP shows Na  122.  Scr 2.12 (up from 1.6). K 4.5.   D/w Dr. Haroldine Laws, will direct admit for a/c HF and hyponatremia.    Cardiac Studies: - RHC 10/22 Findings:   RA = 12 RV = 71/9 PA = 79/20 (38) PCW = 21 (v = 30) Fick cardiac output/index = 7.0/3.14 PVR = 3.1 WU FA sat = 99% PA sat = 70%, 74% PAPi = 4.9    Assessment: 1. Moderate mixed PAH with mild to moderate volume overload   Echo (4/22): EF 50-55%, RV ok, severe RVSP 61.6 mmHg, severe biatrial enlargement,  moderate TR, stable AVR, AV mean gradient 11 mmHg  - Echo (3/21): EF 45-50% AVR ok. Septal flattening RV dilated mild HK. Moderate TR RVSP 80 severe bilateral enlargement.   - RHC 05/14/18 showed Mild/Moderate PAH in setting of high output with no evidence of intracardiac shunting. Hemodynamics as below.    - High-Res Chest CT 05/16/18 1. Slightly irregular 0.9 cm peripheral right middle lobe solid pulmonary nodule. 3 month follow up recommended.  2. Dependent basilar predominant patchy subpleural reticulation and ground-glass attenuation with the suggestion of minimal associated traction bronchiolectasis. No frank honeycombing. Findings may indicate an interstitial lung disease such as early usual interstitial pneumonia (UIP) or fibrotic phase nonspecific interstitial pneumonia (NSIP). Suggest a follow-up high-resolution chest CT study in 6-12 months to assess temporal pattern stability, as clinically warranted. 3. Mild cardiomegaly. Minimal interlobular septal thickening and trace dependent bilateral pleural effusions.  4. Small to moderate volume perihepatic ascites. 5. Ectatic 4.3 cm ascending thoracic aorta. Recommend annual imaging followup by CTA or MRA.  6. Left main and 3 vessel coronary atherosclerosis.   - US Abdomen RUQ 05/16/18 - s/p cholecystectomy. + hepatic cirrhosis. Mild ascites.    - Liver Doppler 05/16/18 - No hepatic, splenic, or portal venous thrombosis or occlusion. Mild ascites.   - Echo with bubble study 05/16/18 LVEF 55-60% with "late bubbles" , Mechanical AVR stable with trivial perivalvular regurg, Mild MR, Severe LAE, Severe RV dilation and reduced function. No PFO. Mod TR, PA peak pressure 68 mm Hg   - RHC 05/14/18 RA = 18 RV = 71/17 PA = 69/24 (39) PCW = 20 Fick cardiac output/index = 7.0/3.7 Thermo CO/CI = 7.2/3.8 PVR = 2.6 WU Ao sat = 97% PA sat =  73%, 73% SVC sat = 73%  RA sat = 72%  - RHC 01/18/18 RA = 23 RV = 84/21 PA = 81/38 (53) PCW =  22 Fick cardiac output/index = 3.0/1.6 PVR = 10.4 WU FA sat = 98% on 2L  (checked on RA = 93%) PA sat = 60%, 61% SVC sat = 62%  -14 Day Patch 10/01/2017-10/15/2017: -1 run of Ventricular Tachycardia occurred lasting 10 beats with a max rate of 240 bpm (avg 161 bpm). -Atrial Flutter occurred continuously (100% burden), ranging from 55-132 bpm (avg of 87 bpm).   - NM Ventilation and Perfusion Lung Scan 10/31/2017:  Low probability of pulmonary embolism, using the PIOPED criteria.  - PFTs 07/02/2017:       FVC     86% FEV1   85% FEV1/FVC  73% DLCO  8.2, 32%   - 6MW -  1400 feet; 97% to 85% (recovered with resting)                                    CT Chest 07/02/2017: 1. Stable right middle lobe pulmonary nodule  measuring up to 1 cm. 2. Minimal subpleural reticulation without evidence of honeycombing to  suggest pulmonary fibrosis. Stable centrilobular emphysema. 3. Mediastinal lymphadenopathy, increased from prior. 4. Calcified right hilar lymph nodes and multiple calcified granulomas  throughout the right lung consistent with prior granulomatous disease. 5. Ascending thoracic aortic aneurysm measuring up to 4.5 cm, unchanged. 6. Dilatation of the main pulmonary artery suggestive of pulmonary  hypertension. 7. Cardiomegaly and dense atherosclerotic coronary artery calcifications.   Review of systems complete and found to be negative unless listed in HPI.   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 In light of concurrent left nephrolithiasis, will check CT renal colic next year instead of renal US.    Anticoagulated on Coumadin 01/04/2018   Anxiety 05/10/2016   Last Assessment & Plan:  Doing well off of zoloft.   Asthma    Asymptomatic microscopic hematuria 06/20/2017   Last  Assessment & Plan:  Had hematuria workup in [facility] in 2016 which negative CT and cystoscopy. UA with 2+ blood last visit - we discussed recommendation for repeat workup at 5 years or if degree of hematuria progresses.    Atrial fibrillation ([place]) 03/29/2017   Atrial flutter ([place]) 03/29/2017   Backache 12/14/2015   Last Assessment & Plan:  Pain management referral for further evaluation.   Bilateral pleural effusion 08/09/2017   CHF (congestive heart failure) (HCC)    Chronic allergic rhinitis 04/28/2016   Last Assessment & Plan:  Continue astelin   Chronic anticoagulation 03/29/2017   Chronic atrial fibrillation ([place]) 07/23/2015   Last Assessment & Plan:  Coumadin and metoprolol, cardiology referral to establish care.   Chronic midline back pain 12/14/2015   Last Assessment & Plan:  Pain management referral for further evaluation.   Chronic obstructive lung disease ([place]) 06/12/2016   With hypoxia   Chronic prostatitis 07/23/2015   Last Assessment & Plan:  Has largely resolved since stopping bike riding. Recommend annual DRE AND PSA - will see back 12/2015 for annual screening, given 1st degree fhx. To call office for recurrent prostatitis symptoms.    Chronic respiratory failure with hypoxia ([place]) 07/29/2019   Cirrhosis of liver not due to alcohol ([place]) 07/01/2019   COPD (chronic obstructive pulmonary disease) ([place]) 06/12/2016   Coronary arteriosclerosis in native artery 03/29/2017   Coronary artery disease involving native coronary artery of native heart with angina pectoris ([place]) 03/29/2017   Cough 10/17/2016   Last Assessment & Plan:  Discussed typical course for acute viral illness. If symptoms worsen or fail to improve by 7-10d, delayed ATBs, fluids, rest, NSAIDs/APAP prn. Seek care if not improving. Needs earlier INR check due to ATBs.   Dyspnea 02/01/2016   Last Assessment & Plan:  Overall improving, eval by pulm, plan for CT, neg stress test with cardiology. Recent switch to carvedilol due to side effects.    Encounter for therapeutic drug monitoring 01/06/2019   Enterococcal bacteremia    Epidermoid cyst of skin 08/24/2017   Essential hypertension 12/14/2015   Last Assessment & Plan:  Hypertension control: controlled  Medications: compliant Medication Management: as noted in orders Home blood pressure monitoring recommended additionally as needed for symptoms  The patient's care plan was reviewed and updated. Instructions and counseling were provided regarding patient goals and barriers. He was  counseled to adopt a healthy lifestyle. Educational resources and self-management tools have been provided as charted in [facility] list.    H/O maze procedure 03/29/2017   [name] 03/29/2017   Overview:  2011   History of coronary artery bypass graft 03/29/2017   Hx of CABG 03/29/2017   Hyperlipidemia 03/29/2017   Hypersensitivity angiitis ([place]) 10/01/2017   Hypertensive heart disease 03/29/2017   Hypertensive heart disease with heart failure ([place]) 03/29/2017   Hypertensive heart failure ([place]) 03/29/2017   Hypokalemia due to excessive renal loss of potassium 02/18/2018   Hypotension, chronic 06/06/2018   International normalized ratio (INR) raised 07/26/2017   Kidney stone 07/23/2015   Kidney stones 07/23/2015   Overview:  x 3  Last Assessment & Plan:  By [place] has left nephrolithiasis, but not visible by KUB. Will check CT renal colic next year to assess both stone burden as well as to surveil AML.    Left ureteral stone 01/23/2018   Leukocytoclastic vasculitis ([place]) 10/01/2017   Localized edema 01/04/2018   Long term (current) use of anticoagulants 03/29/2017   Lumbar radicular pain 01/19/2016   Lumbar radiculopathy 01/19/2016   Maculopapular rash 09/03/2017   Microscopic hematuria 06/20/2017   Last Assessment & Plan:  Had hematuria workup in [facility] in 2016 which negative CT and cystoscopy. UA with 2+ blood last visit - we discussed recommendation for repeat workup at 5 years or if degree of hematuria progresses.     Multiple nodules of lung 06/12/2016   Nasal discharge 02/25/2016   Last Assessment & Plan:  Trial zyrtec and flonase   Nephrolithiasis 07/23/2015   Overview:  x 3  Last Assessment & Plan:  By [place] has left nephrolithiasis, but not visible by KUB. Will check CT renal colic next year to assess both stone burden as well as to surveil AML.   Overview:  x 3  Last Assessment & Plan:  Has 28m nonobstructing LUP stone - not visible by KUB.  Will check renal [place]8/2019 - he will contact office sooner if symptomatic.    Non-sustained ventricular tachycardia 03/29/2017   Nonsustained ventricular tachycardia 03/29/2017   Other hyperlipidemia 03/29/2017   Palpitations 10/01/2017   Pleural effusion, bilateral 08/09/2017   Pneumonia due to COVID-19 virus 02/07/2021   Post-nasal drainage 02/25/2016   Last Assessment & Plan:  Trial zyrtec and flonase   Prostate cancer screening 06/20/2017   Last Assessment & Plan:  Recommend continued annual CaP screening until within 10 years of life expectancy. Given good health and fhx of longevity, would anticipate CaP screening to continue until age 75  PSA today and again in one year on day of visit.   Pulmonary arterial hypertension ([place] 02/20/2018   Pulmonary edema    Pulmonary hypertension ([place] 08/09/2017   Pulmonary nodules 06/12/2016   S/P AVR 03/20/2016   S/P AVR (aortic valve replacement) 03/20/2016   Strain of deltoid muscle, initial encounter    Supratherapeutic INR 07/26/2017   Syncope and collapse 02/01/2016   Typical atrial flutter ([place] 02/01/2016   Past Surgical History:  Procedure Laterality Date   CHOLECYSTECTOMY     CORONARY ARTERY BYPASS GRAFT     EXPLORATORY LAPAROTOMY  07/30/2017   FOOT SURGERY     FRACTURE SURGERY Right    wrist and forearm   HERNIA REPAIR     MECHANICAL AORTIC VALVE REPLACEMENT     NASAL SINUS SURGERY     RIGHT HEART CATH N/A 01/18/2018   Procedure: RIGHT  HEART CATH;  Surgeon: Jolaine Artist, MD;  Location: [place] CV LAB;  Service:  Cardiovascular;  Laterality: N/A;   RIGHT HEART CATH N/A 05/14/2018   Procedure: RIGHT HEART CATH;  Surgeon: Jolaine Artist, MD;  Location: [place] CV LAB;  Service: Cardiovascular;  Laterality: N/A;   RIGHT HEART CATH N/A 09/29/2021   Procedure: RIGHT HEART CATH;  Surgeon: Jolaine Artist, MD;  Location: [place] CV LAB;  Service: Cardiovascular;  Laterality: N/A;   TEE WITHOUT CARDIOVERSION N/A 05/21/2018   Procedure: TRANSESOPHAGEAL ECHOCARDIOGRAM (TEE);  Surgeon: Jolaine Artist, MD;  Location: [facility] ENDOSCOPY;  Service: Cardiovascular;  Laterality: N/A;   UPPER GASTROINTESTINAL ENDOSCOPY  07/12/2017   Patchy areas of mucosal inflammation noted in the antrum with edema,erthema and ulcerations. Bx. Chronicfocally active gastritis.   VASECTOMY     Current Medications: No outpatient medications have been marked as taking for the 11/03/21 encounter [facility] Encounter) with [facility].    Allergies:   Pantoprazole sodium, Valium [diazepam], Tape, and Wound dressing adhesive   Social History   Socioeconomic History   Marital status: Single    Spouse name: Not on file   Number of children: Not on file   Years of education: Not on file   Highest education level: Not on file  Occupational History   Not on file  Tobacco Use   Smoking status: Former    Packs/day: 2.00    Years: 34.00    Pack years: 68.00    Types: Cigarettes    Quit date: 07/16/2000    Years since quitting: 21.3   Smokeless tobacco: Never  Vaping Use   Vaping Use: Never used  Substance and Sexual Activity   Alcohol use: Not Currently   Drug use: No   Sexual activity: Not on file  Other Topics Concern   Not on file  Social History Narrative   Not on file   Social Determinants of Health   Financial Resource Strain: Low Risk    Difficulty of Paying Living Expenses: Not very hard  Food Insecurity: No Food Insecurity   Worried About Running Out of Food in the Last Year: Never true   Ran Out of  Food in the Last Year: Never true  Transportation Needs: No Transportation Needs   Lack of Transportation (Medical): No   Lack of Transportation (Non-Medical): No  Physical Activity: Not on file  Stress: Not on file  Social Connections: Not on file    Family History: The patient's family history includes Arthritis in his mother; Asthma in his mother; Heart attack in his father; Hypertension in his father; Stroke in his paternal grandmother. There is no history of Colon cancer.  Recent Labs: 08/16/2021: Pro B Natriuretic peptide (BNP) 601.0 08/17/2021: TSH 2.71 09/29/2021: ALT 17 09/30/2021: B Natriuretic Peptide 840.7 10/02/2021: Magnesium 2.0 10/11/2021: Hemoglobin 10.6; Platelets 189.0 11/02/2021: BUN 38; Creatinine, Ser 1.59; Potassium 3.6; Sodium 121   Recent Lipid Panel    Component Value Date/Time   CHOL 133 10/12/2020 1104   TRIG 111 10/12/2020 1104   HDL 44 10/12/2020 1104   CHOLHDL 3.0 10/12/2020 1104   VLDL 11 10/21/2018 1028   LDLCALC 70 10/12/2020 1104   BP 110/62   Pulse 90   Wt 64.6 kg (142 lb 6.4 oz)   SpO2 98%   BMI 21.03 kg/m     Wt Readings from Last 3 Encounters:  11/03/21 64.6 kg (142 lb 6.4 oz)  10/14/21 65 kg (143 lb 3.2 oz)  10/11/21 65 kg (143 lb 3.2 oz)   PHYSICAL EXAM: ReDs Clip 41%  General:  fatigue appearing. No respiratory difficulty HEENT: normal Neck: supple. JVD to jaw. Carotids 2+ bilat; no bruits. No lymphadenopathy or thyromegaly appreciated. Cor: PMI nondisplaced. Regular rate & rhythm. 2/6 SEM  Lungs: decreased BS at the bases bilaterally  Abdomen: soft, nontender, nondistended. No hepatosplenomegaly. No bruits or masses. Good bowel sounds. Extremities: no clubbing, rash, 1+ bilateral LEE, + ted hoses  Neuro: alert & oriented x 3, cranial nerves grossly intact. moves all 4 extremities w/o difficulty. Affect pleasant.    Assessment/Plan 1. Acute on Chronic Combined Systolic and Diastolic HF - Admitted 17/79/39 for IV diuresis  after moderately elevated filling pressures on RHC.  - Echo 10/22 EF 45-50 RV low normal  - Stable NYHA III. Volume overloaded after running out of diuretics x 4 days, c/b AKI and hyponatremia - Direct admit. IV Lasix 80 mg bid + Tolvaptan  - Continue Farxiga 10 mg daily. - Continue spiro 25 mg daily (monitor SCr/K). - off ? blocker due to h/o bradycardia/pauses - no ARB/ARNi w/ CKD  - Fluid Restrict - Follow BMP   2. Hyponatremia - Hypervolemic Hyponatremia  - NA 122 - Tolvaptan 15 mg x 1  - Diuresis per above  - Fluid restrict   3. AKI on CKD, Stage IIIa  - Baseline SCr 1.5-1.8 - SCr 2.12 on admit - Suspect cardiorenal  - Monitor w/ diuresis    4. Pulmonary hypertension with cor pulmonale/RV failure - WHO Group I & II - Auto-immune serologies negative (checked twice). - ? Component of HHT/shunt/AVMs with late bubbles on bubble study.  - Echo with bubble study (05/16/18): EF 55-60% with "late bubbles" , Mechanical AVR stable with trivial perivalvular regurg, Mild MR, Severe LAE, Severe RV dilation and reduced function. No PFO. Mod TR, PA peak pressure 68 mm Hg - Echo (4/22): EF 50-55%, RV ok, severe RVSP 61.6 mmHg, severe biatrial enlargement, moderate TR, stable AVR, AV mean gradient 11 mmHg - Echo 10/22 EF 45-50 RV low normal  - RHC data as above - Ab u/s with cirrhosis but no evidence of portal HTN. - On triple therapy with Macitentan and Adcirca. Uptravi dose at 400/200 limited by cramping.  Numbers not c/w need for IV therapies   4. Chronic Respiratory Failure - High Rest CT 05/16/18 with "dependent basilar predominant patchy subpleural reticulation and ground-glass attenuation with the suggestion of minimal associated traction bronchiolectasis. No frank honeycombing. Findings may indicate an interstitial lung disease such as early usual interstitial pneumonia (UIP) or fibrotic phase nonspecific interstitial pneumonia (NSIP)." - Followed by Dr. Lamonte Sakai.   5. Chronic  AFL/Pauses - Rate controlled. Now off metoprolol.  - Multiple pauses 2-3 sec during recent admit 10/22. - Keep off beta blocker.  - Continue coumadin with mechanical AVR. - INR followed by [facility]mad[facility] Clinic in [place].   6. Frequent PVCs - This may be contributing to HF - Repeat echo stable    7. CAD - s/p CABG 07/2017 with Dr Roderic Palau in [place].  - No s/s ischemia - Continue Crestor 5 mg daily (unable to tolerate atorva due to myalgias).  - Lipids followed by Dr. Bettina Gavia.   8. S/p mechanical AVR - Stable on Echo 4/22. - On coumadin. - Aware of need for SBE prophylaxis.   9. Leukocytoclastic vasculitis - F/u with Rheumatology.  - No changes.   10. Cirrhosis - US Abdomen RUQ 05/16/18 - s/p cholecystectomy. + hepatic cirrhosis. Mild  ascites.  - Liver Doppler 05/16/18 - No hepatic, splenic, or portal venous thrombosis or occlusion. Mild ascites. - Continue midodrine 15 mg bid.   Lyda Jester, PA-C  11/03/21

## 2021-11-04 ENCOUNTER — Encounter (HOSPITAL_COMMUNITY): Payer: Self-pay | Admitting: Internal Medicine

## 2021-11-04 ENCOUNTER — Inpatient Hospital Stay (HOSPITAL_COMMUNITY): Payer: Medicare Other

## 2021-11-04 ENCOUNTER — Other Ambulatory Visit: Payer: Self-pay

## 2021-11-04 DIAGNOSIS — I5043 Acute on chronic combined systolic (congestive) and diastolic (congestive) heart failure: Secondary | ICD-10-CM | POA: Diagnosis not present

## 2021-11-04 LAB — CBC
HCT: 28.9 % — ABNORMAL LOW (ref 39.0–52.0)
Hemoglobin: 9.2 g/dL — ABNORMAL LOW (ref 13.0–17.0)
MCH: 23.9 pg — ABNORMAL LOW (ref 26.0–34.0)
MCHC: 31.8 g/dL (ref 30.0–36.0)
MCV: 75.1 fL — ABNORMAL LOW (ref 80.0–100.0)
Platelets: 132 10*3/uL — ABNORMAL LOW (ref 150–400)
RBC: 3.85 MIL/uL — ABNORMAL LOW (ref 4.22–5.81)
RDW: 20 % — ABNORMAL HIGH (ref 11.5–15.5)
WBC: 2.9 10*3/uL — ABNORMAL LOW (ref 4.0–10.5)
nRBC: 0 % (ref 0.0–0.2)

## 2021-11-04 LAB — BASIC METABOLIC PANEL
Anion gap: 9 (ref 5–15)
BUN: 41 mg/dL — ABNORMAL HIGH (ref 8–23)
CO2: 28 mmol/L (ref 22–32)
Calcium: 9 mg/dL (ref 8.9–10.3)
Chloride: 85 mmol/L — ABNORMAL LOW (ref 98–111)
Creatinine, Ser: 1.85 mg/dL — ABNORMAL HIGH (ref 0.61–1.24)
GFR, Estimated: 38 mL/min — ABNORMAL LOW (ref >60)
Glucose, Bld: 105 mg/dL — ABNORMAL HIGH (ref 70–99)
Potassium: 3.5 mmol/L (ref 3.5–5.1)
Sodium: 122 mmol/L — ABNORMAL LOW (ref 135–145)

## 2021-11-04 LAB — PROTIME-INR
INR: 2.3 — ABNORMAL HIGH (ref 0.8–1.2)
Prothrombin Time: 25.2 seconds — ABNORMAL HIGH (ref 11.4–15.2)

## 2021-11-04 LAB — GLUCOSE, CAPILLARY
Glucose-Capillary: 108 mg/dL — ABNORMAL HIGH (ref 70–99)
Glucose-Capillary: 112 mg/dL — ABNORMAL HIGH (ref 70–99)

## 2021-11-04 LAB — MAGNESIUM: Magnesium: 2.4 mg/dL (ref 1.7–2.4)

## 2021-11-04 LAB — SODIUM: Sodium: 126 mmol/L — ABNORMAL LOW (ref 135–145)

## 2021-11-04 MED ORDER — WARFARIN SODIUM 5 MG PO TABS
5.0000 mg | ORAL_TABLET | Freq: Once | ORAL | Status: AC
  Administered 2021-11-04: 5 mg via ORAL
  Filled 2021-11-04: qty 1

## 2021-11-04 MED ORDER — TOLVAPTAN 15 MG PO TABS
15.0000 mg | ORAL_TABLET | Freq: Once | ORAL | Status: AC
  Administered 2021-11-04: 15 mg via ORAL
  Filled 2021-11-04: qty 1

## 2021-11-04 MED ORDER — LORATADINE 10 MG PO TABS
10.0000 mg | ORAL_TABLET | Freq: Every day | ORAL | Status: DC
  Administered 2021-11-04 – 2021-11-07 (×4): 10 mg via ORAL
  Filled 2021-11-04 (×4): qty 1

## 2021-11-04 MED ORDER — POTASSIUM CHLORIDE CRYS ER 20 MEQ PO TBCR
40.0000 meq | EXTENDED_RELEASE_TABLET | Freq: Two times a day (BID) | ORAL | Status: AC
  Administered 2021-11-04 (×2): 40 meq via ORAL
  Filled 2021-11-04 (×2): qty 2

## 2021-11-04 MED ORDER — LIP MEDEX EX OINT
TOPICAL_OINTMENT | CUTANEOUS | Status: DC | PRN
  Filled 2021-11-04: qty 7

## 2021-11-04 MED ORDER — INSULIN ASPART 100 UNIT/ML IJ SOLN: 0.0000 [IU] | Freq: Three times a day (TID) | INTRAMUSCULAR | Status: DC

## 2021-11-04 NOTE — TOC Progression Note (Signed)
Transition of Care [facility]) - Progression Note    Patient Details  Name: Lance Hicks MRN: [id number] Date of Birth: [birth year]  Transition of Care [facility] CM/SW [place], RN Phone Number: 11/04/2021, 3:20 PM  Clinical Narrative:    Consult for medication assistance.Patient has insurance therefore is ineligible for [facility] or medication assistance. Can utilize good rxif needed.   Expected Discharge Plan: [place] Barriers to Discharge: Continued Medical Work up  Expected Discharge Plan and Services Expected Discharge Plan: [place]                                               Social Determinants of Health (SDOH) Interventions    Readmission Risk Interventions No flowsheet data found.

## 2021-11-04 NOTE — Progress Notes (Addendum)
Advanced Heart Failure Rounding Note  PCP-Cardiologist: Dr. Haroldine Laws   Subjective:   11/10: Direct admit for a/c CHF w/ volume overload and hyponatremia, Na 122.  Got Tolvaptan 15 mg + 80 IV Lasix last night.   Na unchanged, 122 today.   1L in UOP overnight. 408 cc out this morning so far.   SCr better 2.12>>1.90>>1.85 Chronic Afib 80s w/ frequent PVCs ~15/min K 3.5  Mg 2.4   Denies resting dyspnea. Only complaint is temp in room. Feels cold. Hgb 9.2  Asking for home allergy medication.     Objective:   Weight Range: 62.9 kg Body mass index is 20.48 kg/m.   Vital Signs:   Temp:  [97.3 F (36.3 C)-97.8 F (36.6 C)] 97.8 F (36.6 C) (11/11 0820) Pulse Rate:  [54-66] 66 (11/11 0820) Resp:  [16-20] 19 (11/11 0820) BP: (97-107)/(62-76) 102/62 (11/11 0820) SpO2:  [92 %-99 %] 95 % (11/11 0820) Weight:  [62.9 kg] 62.9 kg (11/11 0426) Last BM Date: 11/03/21  Weight change: Filed Weights   11/04/21 0426  Weight: 62.9 kg    Intake/Output:   Intake/Output Summary (Last 24 hours) at 11/04/2021 0906 Last data filed at 11/04/2021 0818 Gross per 24 hour  Intake 600 ml  Output 1458 ml  Net -858 ml      Physical Exam    General:  thin elderly WM. No resp difficulty HEENT: Normal Neck: Supple. JVP elevated to ear. Carotids 2+ bilat; no bruits. No lymphadenopathy or thyromegaly appreciated. Cor: PMI nondisplaced.  Irregularly irregular rhythm. No rubs, gallops or murmurs. Lungs: decreased BS at the bases  Abdomen: Soft, nontender, nondistended. No hepatosplenomegaly. No bruits or masses. Good bowel sounds. Extremities: No cyanosis, clubbing, rash, edema, chronic bilateral LE venous stasis dermatitis, LUE bandaged  Neuro: Alert & orientedx3, cranial nerves grossly intact. moves all 4 extremities w/o difficulty. Affect pleasant   Telemetry   Chronic Afib, 80s. PVCs ~15/hr   EKG    No new EKG to review   Labs    CBC Recent Labs    11/03/21 1821  11/04/21 0402  WBC 3.6* 2.9*  NEUTROABS 2.3  --   HGB 10.5* 9.2*  HCT 31.5* 28.9*  MCV 75.4* 75.1*  PLT 155 086*   Basic Metabolic Panel Recent Labs    11/03/21 1821 11/04/21 0402  NA 122* 122*  K 4.1 3.5  CL 84* 85*  CO2 26 28  GLUCOSE 64* 105*  BUN 41* 41*  CREATININE 1.90* 1.85*  CALCIUM 9.7 9.0  MG  --  2.4   Liver Function Tests Recent Labs    11/03/21 1821  AST 37  ALT 18  ALKPHOS 57  BILITOT 2.5*  PROT 7.9  ALBUMIN 3.8   No results for input(s): LIPASE, AMYLASE in the last 72 hours. Cardiac Enzymes No results for input(s): CKTOTAL, CKMB, CKMBINDEX, TROPONINI in the last 72 hours.  BNP: BNP (last 3 results) Recent Labs    09/12/21 1238 09/27/21 1546 09/30/21 0241  BNP 1,141.4* 953.2* 840.7*    ProBNP (last 3 results) Recent Labs    07/04/21 1607 08/16/21 1620  PROBNP 425.0* 601.0*     D-Dimer No results for input(s): DDIMER in the last 72 hours. Hemoglobin A1C No results for input(s): HGBA1C in the last 72 hours. Fasting Lipid Panel No results for input(s): CHOL, HDL, LDLCALC, TRIG, CHOLHDL, LDLDIRECT in the last 72 hours. Thyroid Function Tests No results for input(s): TSH, T4TOTAL, T3FREE, THYROIDAB in the last 72 hours.  Invalid input(s): FREET3  Other results:   Imaging    DG Chest 2 View  Result Date: 11/04/2021 CLINICAL DATA:  Difficulty breathing EXAM: CHEST - 2 VIEW COMPARISON:  Previous studies including the examination of 08/17/2021 FINDINGS: Transverse diameter of heart is increased. There is evidence of previous cardiac surgery. Central pulmonary vessels are prominent. There are no signs of alveolar pulmonary edema. There is slight prominence of interstitial markings in both lungs with no significant change. There is no focal pulmonary consolidation. There is no significant pleural effusion or pneumothorax. IMPRESSION: Cardiomegaly. There is slight prominence of interstitial markings in both lungs which may suggest mild  interstitial edema or interstitial pneumonitis. Part of this finding may suggest underlying scarring. There are no signs of alveolar pulmonary edema or focal pulmonary consolidation. Electronically Signed   By: Elmer Picker M.D.   On: 11/04/2021 08:15     Medications:     Scheduled Medications:  aspirin EC  81 mg Oral q AM   dapagliflozin propanediol  10 mg Oral QAC breakfast   furosemide  80 mg Intravenous BID   macitentan  10 mg Oral Daily   midodrine  15 mg Oral TID WC   rosuvastatin  5 mg Oral Daily   Selexipag  200 mcg Oral QHS   Selexipag  400 mcg Oral Daily   sodium chloride flush  3 mL Intravenous Q12H   spironolactone  25 mg Oral Daily   tadalafil (PAH)  40 mg Oral Daily   tolvaptan  15 mg Oral Once   Warfarin - Pharmacist Dosing Inpatient   Does not apply q1600    Infusions:  sodium chloride      PRN Medications: sodium chloride, acetaminophen, sodium chloride flush    Assessment/Plan   1. Acute on Chronic Combined Systolic and Diastolic HF - Admitted 70/62/37 for IV diuresis after moderately elevated filling pressures on RHC.  - Echo 10/22 EF 45-50% RV low normal  - Stable NYHA III. Now readmitted w/ volume overload after running out of diuretics x 4 days, c/b AKI and hyponatremia - Continue IV Lasix 80 mg bid  - Continue Farxiga 10 mg daily. - Continue spiro 25 mg daily  - off ? blocker due to h/o bradycardia/pauses - no ARB/ARNi w/ CKD  - Fluid Restrict - Follow BMP    2. Hyponatremia - Hypervolemic Hyponatremia, Na 122 on admit  - Tolvaptan 15 given 11/10  - Na 122 today  - Repeat Tolvaptan 15 mg x 1  - Diuresis per above  - Fluid restrict    3. AKI on CKD, Stage IIIa  - Baseline SCr 1.5-1.8 - SCr 2.12 on admit - Suspect cardiorenal  - improving w/ diuresis, SCr 1.85 today  - Monitor w/ diuresis    4. Pulmonary hypertension with cor pulmonale/RV failure - WHO Group I & II - Auto-immune serologies negative (checked twice). - ?  Component of HHT/shunt/AVMs with late bubbles on bubble study.  - Echo with bubble study (05/16/18): EF 55-60% with "late bubbles" , Mechanical AVR stable with trivial perivalvular regurg, Mild MR, Severe LAE, Severe RV dilation and reduced function. No PFO. Mod TR, PA peak pressure 68 mm Hg - Echo (4/22): EF 50-55%, RV ok, severe RVSP 61.6 mmHg, severe biatrial enlargement, moderate TR, stable AVR, AV mean gradient 11 mmHg - Echo 10/22 EF 45-50 RV low normal  - RHC data as above - Ab u/s with cirrhosis but no evidence of portal HTN. - On triple therapy with Macitentan  and Adcirca. Uptravi dose at 400/200 limited by cramping.  Numbers not c/w need for IV therapies   5. Chronic Respiratory Failure - [place] CT 05/16/18 with "dependent basilar predominant patchy subpleural reticulation and ground-glass attenuation with the suggestion of minimal associated traction bronchiolectasis. No frank honeycombing. Findings may indicate an interstitial lung disease such as early usual interstitial pneumonia (UIP) or fibrotic phase nonspecific interstitial pneumonia (NSIP)." - Followed by Dr. Lamonte Sakai.   6. Chronic AFL/Pauses - Rate controlled. Now off metoprolol.  - Multiple pauses 2-3 sec during recent admit 10/22. - Keep off beta blocker.  - Continue coumadin with mechanical AVR. INR 2.3 today  - [facility] in [place].   7. Frequent PVCs - This may be contributing to HF - Repeat echo stable  - ~15/min on tele - keep K > 4.0 and Mg > 4.0 - monitor, may need amio vs mexiletine    8. CAD - s/p CABG 07/2017 with Dr Roderic Palau in [place].  - No s/s ischemia - Continue Crestor 5 mg daily (unable to tolerate atorva due to myalgias).  - Lipids followed by Dr. Bettina Gavia.   9. S/p mechanical AVR - Stable on Echo 4/22. - On coumadin. - Aware of need for SBE prophylaxis.   10. Leukocytoclastic vasculitis - F/u with Rheumatology.  - No changes.   11. Cirrhosis - US Abdomen RUQ 05/16/18 - s/p  cholecystectomy. + hepatic cirrhosis. Mild ascites.  - Liver Doppler 05/16/18 - No hepatic, splenic, or portal venous thrombosis or occlusion. Mild ascites. - Continue midodrine 15 mg bid.   12. LUE Skin Tear - WOC following   13. Anemia - Hgb 9.2 - Check iron stores    Length of Stay: 1  Brittainy Simmons, PA-C  11/04/2021, 9:06 AM  Advanced Heart Failure Team Pager [phone] (M-F; 7a - 5p)  Please contact [facility] for night-coverage after hours (5p -7a ) and weekends on [email or web address]  Patient seen and examined with the above-signed Advanced Practice Provider and/or Housestaff. I personally reviewed laboratory data, imaging studies and relevant notes. I independently examined the patient and formulated the important aspects of the plan. I have edited the note to reflect any of my changes or salient points. I have personally discussed the plan with the patient and/or family.  Diuresis picking up. Denies SOB, orthopnea or PND but still feels edematous. Serum sodium remains low despite tolvaptan  General:  Chronically ill-appearing. No resp difficulty HEENT: normal Neck: supple. JVP to jaw with prominent v waves . Carotids 2+ bilat; no bruits. No lymphadenopathy or thryomegaly appreciated. Cor: PMI nondisplaced. Irregular rate & rhythm. Mech s2 2/6 SEM Lungs: clear Abdomen: soft, nontender, nondistended. No hepatosplenomegaly. No bruits or masses. Good bowel sounds. Extremities: no cyanosis, clubbing, rash, 1+  edema multiple ecchymoses  Neuro: alert & orientedx3, cranial nerves grossly intact. moves all 4 extremities w/o difficulty. Affect pleasant  Volume status improving but overall very tenuous. Remains severely hyponatremic. Will continue IV diuresis. Repeat tolvaptan.   Consider Palliative Care involvement to discuss goals of care prior to d/c.   Glori Bickers, MD  7:49 PM

## 2021-11-04 NOTE — Plan of Care (Signed)
  Problem: Clinical Measurements: Goal: Respiratory complications will improve Outcome: Progressing   Problem: Elimination: Goal: Will not experience complications related to urinary retention Outcome: Progressing   Problem: Safety: Goal: Ability to remain free from injury will improve Outcome: Progressing

## 2021-11-04 NOTE — Progress Notes (Signed)
[facility] for warfarin Indication: atrial fibrillation and mechanical AVR  Allergies  Allergen Reactions   Pantoprazole Sodium Nausea Only    Gets gassy and starting itching like crazy Gets gassy and starting itching like crazy   Valium [Diazepam] Other (See Comments)    HA and Abd pain.   Tape Rash and Other (See Comments)    Surgical tape   Wound Dressing Adhesive Other (See Comments) and Rash    Surgical tape Surgical tape Surgical tape    Patient Measurements:   Vital Signs: Temp: 97.8 F (36.6 C) (11/11 0820) Temp Source: Oral (11/11 0820) BP: 102/62 (11/11 0820) Pulse Rate: 66 (11/11 0820)  Labs: Recent Labs    11/03/21 1205 11/03/21 1821 11/04/21 0402  HGB  --  10.5* 9.2*  HCT  --  31.5* 28.9*  PLT  --  155 132*  LABPROT  --  23.0* 25.2*  INR  --  2.0* 2.3*  CREATININE 2.12* 1.90* 1.85*     Estimated Creatinine Clearance: 31.2 mL/min (A) (by C-G formula based on SCr of 1.85 mg/dL (H)).   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 In light of concurrent left nephrolithiasis, will check CT renal colic next year instead of renal US.    Anticoagulated on Coumadin 01/04/2018   Anxiety 05/10/2016   Last Assessment & Plan:  Doing well off of zoloft.   Asthma    Asymptomatic microscopic hematuria 06/20/2017   Last Assessment & Plan:  Had hematuria workup in [facility] in 2016 which negative CT and cystoscopy. UA with 2+ blood last visit - we discussed recommendation for repeat workup at 5 years or if degree of hematuria progresses.    Atrial fibrillation ([place]) 03/29/2017   Atrial flutter ([place]) 03/29/2017   Backache 12/14/2015   Last Assessment & Plan:  Pain management referral for further evaluation.   Bilateral pleural  effusion 08/09/2017   CHF (congestive heart failure) (HCC)    Chronic allergic rhinitis 04/28/2016   Last Assessment & Plan:  Continue astelin   Chronic anticoagulation 03/29/2017   Chronic atrial fibrillation ([place]) 07/23/2015   Last Assessment & Plan:  Coumadin and metoprolol, cardiology referral to establish care.   Chronic midline back pain 12/14/2015   Last Assessment & Plan:  Pain management referral for further evaluation.   Chronic obstructive lung disease ([place]) 06/12/2016   With hypoxia   Chronic prostatitis 07/23/2015   Last Assessment & Plan:  Has largely resolved since stopping bike riding. Recommend annual DRE AND PSA - will see back 12/2015 for annual screening, given 1st degree fhx. To call office for recurrent prostatitis symptoms.    Chronic respiratory failure with hypoxia ([place]) 07/29/2019   Cirrhosis of liver not due to alcohol ([place]) 07/01/2019   COPD (chronic obstructive pulmonary disease) ([place]) 06/12/2016   Coronary arteriosclerosis in native artery 03/29/2017   Coronary artery disease involving native coronary artery of native heart with angina pectoris ([place]) 03/29/2017   Cough 10/17/2016   Last Assessment & Plan:  Discussed typical course for acute viral illness. If symptoms worsen or fail to improve by 7-10d, delayed ATBs, fluids, rest, NSAIDs/APAP prn. Seek care if not improving. Needs earlier INR check due to ATBs.   Dyspnea 02/01/2016   Last Assessment &  Plan:  Overall improving, eval by pulm, plan for CT, neg stress test with cardiology. Recent switch to carvedilol due to side effects.   Encounter for therapeutic drug monitoring 01/06/2019   Enterococcal bacteremia    Epidermoid cyst of skin 08/24/2017   Essential hypertension 12/14/2015   Last Assessment & Plan:  Hypertension control: controlled  Medications: compliant Medication Management: as noted in orders Home blood pressure monitoring recommended additionally as needed for symptoms  The patient's care plan was reviewed and  updated. Instructions and counseling were provided regarding patient goals and barriers. He was counseled to adopt a healthy lifestyle. Educational resources and self-management tools have been provided as charted in [facility] list.    H/O maze procedure 03/29/2017   [name] 03/29/2017   Overview:  2011   History of coronary artery bypass graft 03/29/2017   Hx of CABG 03/29/2017   Hyperlipidemia 03/29/2017   Hypersensitivity angiitis ([place]) 10/01/2017   Hypertensive heart disease 03/29/2017   Hypertensive heart disease with heart failure ([place]) 03/29/2017   Hypertensive heart failure ([place]) 03/29/2017   Hypokalemia due to excessive renal loss of potassium 02/18/2018   Hypotension, chronic 06/06/2018   International normalized ratio (INR) raised 07/26/2017   Kidney stone 07/23/2015   Kidney stones 07/23/2015   Overview:  x 3  Last Assessment & Plan:  By [place] has left nephrolithiasis, but not visible by KUB. Will check CT renal colic next year to assess both stone burden as well as to surveil AML.    Left ureteral stone 01/23/2018   Leukocytoclastic vasculitis ([place]) 10/01/2017   Localized edema 01/04/2018   Long term (current) use of anticoagulants 03/29/2017   Lumbar radicular pain 01/19/2016   Lumbar radiculopathy 01/19/2016   Maculopapular rash 09/03/2017   Microscopic hematuria 06/20/2017   Last Assessment & Plan:  Had hematuria workup in [facility] in 2016 which negative CT and cystoscopy. UA with 2+ blood last visit - we discussed recommendation for repeat workup at 5 years or if degree of hematuria progresses.    Multiple nodules of lung 06/12/2016   Nasal discharge 02/25/2016   Last Assessment & Plan:  Trial zyrtec and flonase   Nephrolithiasis 07/23/2015   Overview:  x 3  Last Assessment & Plan:  By [place] has left nephrolithiasis, but not visible by KUB. Will check CT renal colic next year to assess both stone burden as well as to surveil AML.   Overview:  x 3  Last Assessment & Plan:  Has 40m  nonobstructing LUP stone - not visible by KUB.  Will check renal [place] - he will contact office sooner if symptomatic.    Non-sustained ventricular tachycardia 03/29/2017   Nonsustained ventricular tachycardia 03/29/2017   Other hyperlipidemia 03/29/2017   Palpitations 10/01/2017   Pleural effusion, bilateral 08/09/2017   Pneumonia due to COVID-19 virus 02/07/2021   Post-nasal drainage 02/25/2016   Last Assessment & Plan:  Trial zyrtec and flonase   Prostate cancer screening 06/20/2017   Last Assessment & Plan:  Recommend continued annual CaP screening until within 10 years of life expectancy. Given good health and fhx of longevity, would anticipate CaP screening to continue until age 75  PSA today and again in one year on day of visit.   Pulmonary arterial hypertension ([place] 02/20/2018   Pulmonary edema    Pulmonary hypertension ([place] 08/09/2017   Pulmonary nodules 06/12/2016   S/P AVR 03/20/2016   S/P AVR (aortic valve replacement) 03/20/2016   Strain of deltoid  muscle, initial encounter    Supratherapeutic INR 07/26/2017   Syncope and collapse 02/01/2016   Typical atrial flutter ([place]) 02/01/2016    Assessment: 75yom with PAH EF 45% and permanent AF admitted with volume overload.   Admit INR 2.0 < goal. INR 2.3 today, still below goal. CBC stable.  PTA warfarin regimen =  94m MWF, 2.512mTTSS  Goal of Therapy:  INR 2.5-3 Monitor platelets by anticoagulation protocol: Yes   Plan:  Warfarin 87m40m1 - usual dose  Daily Protime   EmmLaurey ArrowharmD PGY1 Pharmacy Resident 11/04/2021  9:24 AM  Please check [email or web address] for unit-specific pharmacy phone numbers.

## 2021-11-04 NOTE — Progress Notes (Addendum)
Inpatient Diabetes Program Recommendations  AACE/[name]: New Consensus Statement on Inpatient Glycemic Control (2015)  Target Ranges:  Prepandial:   less than 140 mg/dL      Peak postprandial:   less than 180 mg/dL (1-2 hours)      Critically ill patients:  140 - 180 mg/dL   Lab Results  Component Value Date   GLUCAP 107 (H) 05/21/2018    Review of Glycemic Control  Diabetes history: DM2 Outpatient Diabetes medications: Farxiga 10 mg qd Current orders for Inpatient glycemic control: Farxiga 10 mg qd  Inpatient Diabetes Program Recommendations:   Please consider: -Glycemic control order set with 0-6 units tid correction Secure chat sent to [employer].  Thank you, Nani Gasser. Jenaya Saar, RN, MSN, CDE  Diabetes Coordinator Inpatient Glycemic Control Team Team Pager [phone] (8am-5pm) 11/04/2021 12:26 PM

## 2021-11-04 NOTE — Consult Note (Signed)
[place] Nurse wound consult note Consultation was completed by review of records, images and assistance from the bedside nurse/clinical staff.  Reason for Consult: skin tear Wound type: skin tear Pressure Injury POA: NA Measurement: to be measured by bedside nurse with next scheduled dressing change  Dressing procedure/placement/frequency:  Continue non adherent dressing, xeroform per nursing skin care order set. Antiseptic and non adherent properties appropriate for skin tear. Top with foam or dry dressing. Change every other day.  Re-consult for any acute changes in the area of concern   Re consult if needed, will not follow at this time. Thanks  Patriciann Becht [employer], RN,CWOCN, CNS, [place] [phone])

## 2021-11-04 NOTE — Progress Notes (Signed)
Heart Failure Navigator Progress Note  Assessed for Heart & Vascular TOC clinic readiness.  Patient does not meet criteria due to AHF rounding are attending team.   Navigator available for reassessment of patient.   Pricilla Holm, MSN, RN Heart Failure Nurse Navigator [phone]

## 2021-11-05 DIAGNOSIS — I5043 Acute on chronic combined systolic (congestive) and diastolic (congestive) heart failure: Secondary | ICD-10-CM | POA: Diagnosis not present

## 2021-11-05 LAB — BASIC METABOLIC PANEL
Anion gap: 10 (ref 5–15)
BUN: 37 mg/dL — ABNORMAL HIGH (ref 8–23)
CO2: 27 mmol/L (ref 22–32)
Calcium: 9.3 mg/dL (ref 8.9–10.3)
Chloride: 91 mmol/L — ABNORMAL LOW (ref 98–111)
Creatinine, Ser: 1.62 mg/dL — ABNORMAL HIGH (ref 0.61–1.24)
GFR, Estimated: 44 mL/min — ABNORMAL LOW (ref >60)
Glucose, Bld: 108 mg/dL — ABNORMAL HIGH (ref 70–99)
Potassium: 4 mmol/L (ref 3.5–5.1)
Sodium: 128 mmol/L — ABNORMAL LOW (ref 135–145)

## 2021-11-05 LAB — VITAMIN B12: Vitamin B-12: 2382 pg/mL — ABNORMAL HIGH (ref 180–914)

## 2021-11-05 LAB — PROTIME-INR
INR: 2.3 — ABNORMAL HIGH (ref 0.8–1.2)
Prothrombin Time: 24.8 seconds — ABNORMAL HIGH (ref 11.4–15.2)

## 2021-11-05 LAB — IRON AND TIBC
Iron: 21 ug/dL — ABNORMAL LOW (ref 45–182)
Saturation Ratios: 4 % — ABNORMAL LOW (ref 17.9–39.5)
TIBC: 504 ug/dL — ABNORMAL HIGH (ref 250–450)
UIBC: 483 ug/dL

## 2021-11-05 LAB — GLUCOSE, CAPILLARY
Glucose-Capillary: 87 mg/dL (ref 70–99)
Glucose-Capillary: 92 mg/dL (ref 70–99)
Glucose-Capillary: 123 mg/dL — ABNORMAL HIGH (ref 70–99)
Glucose-Capillary: 69 mg/dL — ABNORMAL LOW (ref 70–99)
Glucose-Capillary: 108 mg/dL — ABNORMAL HIGH (ref 70–99)

## 2021-11-05 LAB — FERRITIN: Ferritin: 17 ng/mL — ABNORMAL LOW (ref 24–336)

## 2021-11-05 LAB — FOLATE: Folate: 32.9 ng/mL (ref >5.9)

## 2021-11-05 LAB — RETICULOCYTES
Immature Retic Fract: 14.7 % (ref 2.3–15.9)
RBC.: 4.05 MIL/uL — ABNORMAL LOW (ref 4.22–5.81)
Retic Count, Absolute: 45.8 10*3/uL (ref 19.0–186.0)
Retic Ct Pct: 1.1 % (ref 0.4–3.1)

## 2021-11-05 MED ORDER — SODIUM CHLORIDE 0.9 % IV SOLN
510.0000 mg | Freq: Once | INTRAVENOUS | Status: AC
  Administered 2021-11-05: 510 mg via INTRAVENOUS
  Filled 2021-11-05 (×2): qty 17

## 2021-11-05 MED ORDER — DOCUSATE SODIUM 100 MG PO CAPS
100.0000 mg | ORAL_CAPSULE | Freq: Every day | ORAL | Status: DC
  Administered 2021-11-05 – 2021-11-09 (×5): 100 mg via ORAL
  Filled 2021-11-05 (×5): qty 1

## 2021-11-05 MED ORDER — WARFARIN SODIUM 2.5 MG PO TABS
2.5000 mg | ORAL_TABLET | Freq: Once | ORAL | Status: AC
  Administered 2021-11-05: 2.5 mg via ORAL
  Filled 2021-11-05: qty 1

## 2021-11-05 NOTE — Progress Notes (Signed)
ANTICOAGULATION CONSULT NOTE - Follow Up Consult  Pharmacy Consult for warfarin Indication: atrial fibrillation and mechanical AVR  Allergies  Allergen Reactions   Pantoprazole Sodium Nausea Only    Gets gassy and starting itching like crazy Gets gassy and starting itching like crazy   Valium [Diazepam] Other (See Comments)    HA and Abd pain.   Tape Rash and Other (See Comments)    Surgical tape   Wound Dressing Adhesive Other (See Comments) and Rash    Surgical tape Surgical tape Surgical tape    Patient Measurements: Weight: 60.7 kg (133 lb 12.8 oz)  Vital Signs: Temp: 97.6 F (36.4 C) (11/12 0413) Temp Source: Oral (11/12 0413) BP: 95/58 (11/12 0413) Pulse Rate: 66 (11/12 0413)  Labs: Recent Labs    11/03/21 1821 11/04/21 0402 11/05/21 0440  HGB 10.5* 9.2*  --   HCT 31.5* 28.9*  --   PLT 155 132*  --   LABPROT 23.0* 25.2* 24.8*  INR 2.0* 2.3* 2.3*  CREATININE 1.90* 1.85* 1.62*    Estimated Creatinine Clearance: 34.3 mL/min (A) (by C-G formula based on SCr of 1.62 mg/dL (H)).   Assessment: 75yom with PAH EF 45% and permanent AF admitted with volume overload. PTA warfarin regimen =  81m MWF, 2.561mTTSS   INR 2.3 today, still below goal. CBC has been stable and no s/sx of bleeding noted per chart review.    Goal of Therapy:  INR 2.5-3 Monitor platelets by anticoagulation protocol: Yes   Plan:  Warfarin 2.5 mg x1 - usual dose Daily Protime  Thank you for including pharmacy in the care of this patient.  BrZenaida DeedPharmD PGY1 Acute Care Pharmacy Resident  Phone: [phone]/11/2021  9:33 AM  Please check [email or web address] for unit-specific pharmacy phone numbers.

## 2021-11-05 NOTE — Progress Notes (Signed)
Advanced Heart Failure Rounding Note  PCP-Cardiologist: Dr. Haroldine Laws   Subjective:    11/10: Direct admit for a/c CHF w/ volume overload and hyponatremia, Na 122.  Weight down 5 pounds with IV lasix and tolvaptan. Serum sodium 122 -> 128  Feeling better. Still weak. + DOE. Denies orthopnea or PND.    Objective:   Weight Range: 60.7 kg Body mass index is 19.76 kg/m.   Vital Signs:   Temp:  [97.5 F (36.4 C)-98 F (36.7 C)] 98 F (36.7 C) (11/12 1045) Pulse Rate:  [56-72] 56 (11/12 1045) Resp:  [17-19] 18 (11/12 0413) BP: (95-135)/(58-80) 117/80 (11/12 1045) SpO2:  [89 %-97 %] 97 % (11/12 0413) Weight:  [60.7 kg] 60.7 kg (11/12 0500) Last BM Date: 11/04/21  Weight change: Filed Weights   11/04/21 0426 11/05/21 0045 11/05/21 0500  Weight: 62.9 kg 60.7 kg 60.7 kg    Intake/Output:   Intake/Output Summary (Last 24 hours) at 11/05/2021 1158 Last data filed at 11/05/2021 1141 Gross per 24 hour  Intake 1914 ml  Output 4350 ml  Net -2436 ml       Physical Exam    General:  Sitting up in bed  HEENT: normal Neck: supple. JVP 10 with prominent  v waves. Carotids 2+ bilat; no bruits. No lymphadenopathy or thryomegaly appreciated. Cor: PMI nondisplaced. Irregular rate & rhythm. 2/6 SEM. Mech s2 Lungs: clear Abdomen: soft, nontender, nondistended. No hepatosplenomegaly. No bruits or masses. Good bowel sounds. Extremities: no cyanosis, clubbing, rash, edema  multiple ecchymoses  Neuro: alert & orientedx3, cranial nerves grossly intact. moves all 4 extremities w/o difficulty. Affect pleasant  Telemetry    Chronic Afib, 50-60s. PVCs ~15/hr  Personally reviewed   Labs    CBC Recent Labs    11/03/21 1821 11/04/21 0402  WBC 3.6* 2.9*  NEUTROABS 2.3  --   HGB 10.5* 9.2*  HCT 31.5* 28.9*  MCV 75.4* 75.1*  PLT 155 132*    Basic Metabolic Panel Recent Labs    11/04/21 0402 11/04/21 1629 11/05/21 0440  NA 122* 126* 128*  K 3.5  --  4.0  CL 85*  --   91*  CO2 28  --  27  GLUCOSE 105*  --  108*  BUN 41*  --  37*  CREATININE 1.85*  --  1.62*  CALCIUM 9.0  --  9.3  MG 2.4  --   --     Liver Function Tests Recent Labs    11/03/21 1821  AST 37  ALT 18  ALKPHOS 57  BILITOT 2.5*  PROT 7.9  ALBUMIN 3.8    No results for input(s): LIPASE, AMYLASE in the last 72 hours. Cardiac Enzymes No results for input(s): CKTOTAL, CKMB, CKMBINDEX, TROPONINI in the last 72 hours.  BNP: BNP (last 3 results) Recent Labs    09/12/21 1238 09/27/21 1546 09/30/21 0241  BNP 1,141.4* 953.2* 840.7*     ProBNP (last 3 results) Recent Labs    07/04/21 1607 08/16/21 1620  PROBNP 425.0* 601.0*      D-Dimer No results for input(s): DDIMER in the last 72 hours. Hemoglobin A1C No results for input(s): HGBA1C in the last 72 hours. Fasting Lipid Panel No results for input(s): CHOL, HDL, LDLCALC, TRIG, CHOLHDL, LDLDIRECT in the last 72 hours. Thyroid Function Tests No results for input(s): TSH, T4TOTAL, T3FREE, THYROIDAB in the last 72 hours.  Invalid input(s): FREET3  Other results:   Imaging    No results found.   Medications:  Scheduled Medications:  aspirin EC  81 mg Oral q AM   dapagliflozin propanediol  10 mg Oral QAC breakfast   docusate sodium  100 mg Oral Daily   furosemide  80 mg Intravenous BID   insulin aspart  0-6 Units Subcutaneous TID WC   loratadine  10 mg Oral Daily   macitentan  10 mg Oral Daily   midodrine  15 mg Oral TID WC   rosuvastatin  5 mg Oral Daily   Selexipag  200 mcg Oral QHS   Selexipag  400 mcg Oral Daily   sodium chloride flush  3 mL Intravenous Q12H   spironolactone  25 mg Oral Daily   tadalafil (PAH)  40 mg Oral Daily   warfarin  2.5 mg Oral ONCE-1600   Warfarin - Pharmacist Dosing Inpatient   Does not apply q1600    Infusions:  sodium chloride     ferumoxytol      PRN Medications: sodium chloride, acetaminophen, lip balm, sodium chloride flush    Assessment/Plan   1.  Acute on Chronic Combined Systolic and Diastolic HF - Admitted 58/09/98 for IV diuresis after moderately elevated filling pressures on RHC.  - Echo 10/22 EF 45-50% RV low normal  - Readmitted w/ volume overload after running out of diuretics x 4 days, c/b AKI and hyponatremia - Volume status improving with IV Lasix 80 mg bid. Remains fluid overloaded. Will continue IV lasix one more day - Continue Farxiga 10 mg daily. - Continue spiro 25 mg daily  - off ? blocker due to h/o bradycardia/pauses - no ARB/ARNi w/ CKD  - Fluid Restrict - Follow BMP    2. Hyponatremia - Hypervolemic Hyponatremia, Na 122 on admit  - Tolvaptan 15 given 11/10 and 11/11 - Na 122 -> 128 today  - Free water restrict    3. AKI on CKD, Stage IIIa  - Baseline SCr 1.5-1.8 - SCr 2.12 on admit. BAsck to 1.6 with diuresis  - Suspect cardiorenal  - Daily BMET   4. Pulmonary hypertension with cor pulmonale/RV failure - WHO Group I & II - Auto-immune serologies negative (checked twice). - ? Component of HHT/shunt/AVMs with late bubbles on bubble study.  - Echo with bubble study (05/16/18): EF 55-60% with "late bubbles" , Mechanical AVR stable with trivial perivalvular regurg, Mild MR, Severe LAE, Severe RV dilation and reduced function. No PFO. Mod TR, PA peak pressure 68 mm Hg - Echo (4/22): EF 50-55%, RV ok, severe RVSP 61.6 mmHg, severe biatrial enlargement, moderate TR, stable AVR, AV mean gradient 11 mmHg - Echo 10/22 EF 45-50 RV low normal  - RHC data as above - Ab u/s with cirrhosis but no evidence of portal HTN. - On triple therapy with Macitentan and Adcirca. Uptravi dose at 400/200 limited by cramping.  Numbers not c/w need for IV therapies   5. Chronic Respiratory Failure - [place] CT 05/16/18 with "dependent basilar predominant patchy subpleural reticulation and ground-glass attenuation with the suggestion of minimal associated traction bronchiolectasis. No frank honeycombing. Findings may indicate an  interstitial lung disease such as early usual interstitial pneumonia (UIP) or fibrotic phase nonspecific interstitial pneumonia (NSIP)." - Followed by Dr. Lamonte Sakai.   6. Chronic AFL/Pauses - Rate controlled. Now off metoprolol.  - Multiple pauses 2-3 sec during recent admit 10/22. - Keep off beta blocker.  - Continue coumadin with mechanical AVR. INR 2.3 today  - Discussed dosing with PharmD personally.   7. Frequent PVCs - This may be contributing to  HF - Repeat echo stable  - ~15/min on tele - keep K > 4.0 and Mg > 4.0 - monitor, may need amio vs mexiletine    8. CAD - s/p CABG 07/2017 with Dr Roderic Palau in [place].  - No s/s ischemia - Continue Crestor 5 mg daily - Lipids followed by Dr. Bettina Gavia.   9. S/p mechanical AVR - Stable on Echo 4/22. - On coumadin. INR 2.3 Discussed dosing with PharmD personally.  - Aware of need for SBE prophylaxis.   10. Leukocytoclastic vasculitis - F/u with Rheumatology.  - No changes.   11. Cirrhosis - US Abdomen RUQ 05/16/18 - s/p cholecystectomy. + hepatic cirrhosis. Mild ascites.  - Liver Doppler 05/16/18 - No hepatic, splenic, or portal venous thrombosis or occlusion. Mild ascites. - Continue midodrine 15 mg bid.   12. LUE Skin Tear - WOC following   13. Anemia - Hgb 9.2 - Iron low. Will give [name] today   Length of Stay: 2  Glori Bickers, MD  11/05/2021, 11:58 AM  Advanced Heart Failure Team Pager [phone] (M-F; 7a - 5p)  Please contact [facility] for night-coverage after hours (5p -7a ) and weekends on [email or web address]

## 2021-11-06 DIAGNOSIS — I5043 Acute on chronic combined systolic (congestive) and diastolic (congestive) heart failure: Secondary | ICD-10-CM | POA: Diagnosis not present

## 2021-11-06 DIAGNOSIS — E871 Hypo-osmolality and hyponatremia: Secondary | ICD-10-CM | POA: Diagnosis not present

## 2021-11-06 LAB — PROTIME-INR
INR: 2.5 — ABNORMAL HIGH (ref 0.8–1.2)
Prothrombin Time: 27.1 seconds — ABNORMAL HIGH (ref 11.4–15.2)

## 2021-11-06 LAB — BASIC METABOLIC PANEL
Anion gap: 10 (ref 5–15)
Anion gap: 10 (ref 5–15)
BUN: 28 mg/dL — ABNORMAL HIGH (ref 8–23)
BUN: 27 mg/dL — ABNORMAL HIGH (ref 8–23)
CO2: 24 mmol/L (ref 22–32)
CO2: 26 mmol/L (ref 22–32)
Calcium: 8.7 mg/dL — ABNORMAL LOW (ref 8.9–10.3)
Calcium: 9.3 mg/dL (ref 8.9–10.3)
Chloride: 89 mmol/L — ABNORMAL LOW (ref 98–111)
Chloride: 90 mmol/L — ABNORMAL LOW (ref 98–111)
Creatinine, Ser: 1.45 mg/dL — ABNORMAL HIGH (ref 0.61–1.24)
Creatinine, Ser: 1.57 mg/dL — ABNORMAL HIGH (ref 0.61–1.24)
GFR, Estimated: 51 mL/min — ABNORMAL LOW (ref >60)
GFR, Estimated: 46 mL/min — ABNORMAL LOW (ref >60)
Glucose, Bld: 93 mg/dL (ref 70–99)
Glucose, Bld: 82 mg/dL (ref 70–99)
Potassium: 3.3 mmol/L — ABNORMAL LOW (ref 3.5–5.1)
Potassium: 3.5 mmol/L (ref 3.5–5.1)
Sodium: 123 mmol/L — ABNORMAL LOW (ref 135–145)
Sodium: 126 mmol/L — ABNORMAL LOW (ref 135–145)

## 2021-11-06 LAB — HEPATIC FUNCTION PANEL
ALT: 18 U/L (ref 0–44)
AST: 32 U/L (ref 15–41)
Albumin: 3.5 g/dL (ref 3.5–5.0)
Alkaline Phosphatase: 52 U/L (ref 38–126)
Bilirubin, Direct: 0.7 mg/dL — ABNORMAL HIGH (ref 0.0–0.2)
Indirect Bilirubin: 1.4 mg/dL — ABNORMAL HIGH (ref 0.3–0.9)
Total Bilirubin: 2.1 mg/dL — ABNORMAL HIGH (ref 0.3–1.2)
Total Protein: 7.7 g/dL (ref 6.5–8.1)

## 2021-11-06 LAB — GLUCOSE, CAPILLARY
Glucose-Capillary: 95 mg/dL (ref 70–99)
Glucose-Capillary: 89 mg/dL (ref 70–99)
Glucose-Capillary: 84 mg/dL (ref 70–99)
Glucose-Capillary: 122 mg/dL — ABNORMAL HIGH (ref 70–99)
Glucose-Capillary: 95 mg/dL (ref 70–99)

## 2021-11-06 LAB — SODIUM: Sodium: 128 mmol/L — ABNORMAL LOW (ref 135–145)

## 2021-11-06 MED ORDER — POTASSIUM CHLORIDE CRYS ER 20 MEQ PO TBCR
40.0000 meq | EXTENDED_RELEASE_TABLET | ORAL | Status: AC
  Administered 2021-11-06 (×2): 40 meq via ORAL
  Filled 2021-11-06 (×2): qty 2

## 2021-11-06 MED ORDER — TOLVAPTAN 15 MG PO TABS
15.0000 mg | ORAL_TABLET | Freq: Once | ORAL | Status: AC
  Administered 2021-11-06: 15 mg via ORAL
  Filled 2021-11-06: qty 1

## 2021-11-06 MED ORDER — WARFARIN SODIUM 2.5 MG PO TABS
2.5000 mg | ORAL_TABLET | Freq: Once | ORAL | Status: AC
  Administered 2021-11-06: 2.5 mg via ORAL
  Filled 2021-11-06: qty 1

## 2021-11-06 MED ORDER — POTASSIUM CHLORIDE CRYS ER 10 MEQ PO TBCR
40.0000 meq | EXTENDED_RELEASE_TABLET | Freq: Every day | ORAL | Status: DC
  Administered 2021-11-07 – 2021-11-09 (×3): 40 meq via ORAL
  Filled 2021-11-06 (×5): qty 4

## 2021-11-06 NOTE — Progress Notes (Signed)
ANTICOAGULATION CONSULT NOTE - Follow Up Consult  Pharmacy Consult for warfarin Indication: atrial fibrillation and mechanical AVR  Allergies  Allergen Reactions   Pantoprazole Sodium Nausea Only    Gets gassy and starting itching like crazy Gets gassy and starting itching like crazy   Valium [Diazepam] Other (See Comments)    HA and Abd pain.   Tape Rash and Other (See Comments)    Surgical tape   Wound Dressing Adhesive Other (See Comments) and Rash    Surgical tape Surgical tape Surgical tape    Patient Measurements: Weight: 61.9 kg (136 lb 6.4 oz)  Vital Signs: Temp: 98.1 F (36.7 C) (11/13 0313) Temp Source: Oral (11/13 0313) BP: 100/54 (11/13 0313) Pulse Rate: 60 (11/13 0313)  Labs: Recent Labs    11/03/21 1821 11/04/21 0402 11/05/21 0440 11/06/21 0425  HGB 10.5* 9.2*  --   --   HCT 31.5* 28.9*  --   --   PLT 155 132*  --   --   LABPROT 23.0* 25.2* 24.8* 27.1*  INR 2.0* 2.3* 2.3* 2.5*  CREATININE 1.90* 1.85* 1.62* 1.45*     Estimated Creatinine Clearance: 39.1 mL/min (A) (by C-G formula based on SCr of 1.45 mg/dL (H)).   Assessment: 75yom with PAH EF 45% and permanent AF admitted with volume overload. PTA warfarin regimen =  55m MWF, 2.529mTTSS   INR 2.5 today, within goal. CBC has been stable and no s/sx of bleeding noted per chart review.    Goal of Therapy:  INR 2.5-3 Monitor platelets by anticoagulation protocol: Yes   Plan:  Warfarin 2.5 mg x1 - usual dose Daily Protime Monitor for s/sx of bleeding  Thank you for including pharmacy in the care of this patient.  BrZenaida DeedPharmD PGY1 Acute Care Pharmacy Resident  Phone: [phone]/13/2022  7:56 AM  Please check [email or web address] for unit-specific pharmacy phone numbers.

## 2021-11-06 NOTE — Progress Notes (Signed)
Advanced Heart Failure Rounding Note  PCP-Cardiologist: Dr. Haroldine Laws   Subjective:    11/10: Direct admit for a/c CHF w/ volume overload and hyponatremia, Na 122.  Diuresing with IV lasix but weight recorded as up 3 pounds.   Feels weak. Denies SOB, orthopnea or PND. Serum NA down again to 128 -> 123 No confusion    Objective:   Weight Range: 61.9 kg Body mass index is 20.14 kg/m.   Vital Signs:   Temp:  [97.4 F (36.3 C)-98.1 F (36.7 C)] 98.1 F (36.7 C) (11/13 0313) Pulse Rate:  [54-74] 60 (11/13 0313) Resp:  [17-19] 17 (11/13 0313) BP: (100-136)/(54-83) 100/54 (11/13 0313) SpO2:  [91 %-99 %] 96 % (11/13 0313) Weight:  [61.9 kg] 61.9 kg (11/13 0054) Last BM Date: 11/05/21  Weight change: Filed Weights   11/05/21 0045 11/05/21 0500 11/06/21 0054  Weight: 60.7 kg 60.7 kg 61.9 kg    Intake/Output:   Intake/Output Summary (Last 24 hours) at 11/06/2021 1055 Last data filed at 11/06/2021 1042 Gross per 24 hour  Intake 1312.42 ml  Output 3110 ml  Net -1797.58 ml       Physical Exam    General:  Sitting up in bed  HEENT: normal Neck: supple. JVP 8-9 with prominent v waves  Carotids 2+ bilat; no bruits. No lymphadenopathy or thryomegaly appreciated. Cor: PMI nondisplaced. Irregular rate & rhythm. Mech s2 2/6 SEM Lungs: clear Abdomen: soft, nontender, nondistended. No hepatosplenomegaly. No bruits or masses. Good bowel sounds. Extremities: no cyanosis, clubbing, rash, tr edema multiple ecchymoses Neuro: alert & orientedx3, cranial nerves grossly intact. moves all 4 extremities w/o difficulty. Affect pleasant   Telemetry    Chronic Afib, 50-60s. PVCs ~15/hr  Personally reviewed   Labs    CBC Recent Labs    11/03/21 1821 11/04/21 0402  WBC 3.6* 2.9*  NEUTROABS 2.3  --   HGB 10.5* 9.2*  HCT 31.5* 28.9*  MCV 75.4* 75.1*  PLT 155 132*    Basic Metabolic Panel Recent Labs    11/04/21 0402 11/04/21 1629 11/05/21 0440 11/06/21 0425  NA  122*   < > 128* 123*  K 3.5  --  4.0 3.3*  CL 85*  --  91* 89*  CO2 28  --  27 24  GLUCOSE 105*  --  108* 93  BUN 41*  --  37* 28*  CREATININE 1.85*  --  1.62* 1.45*  CALCIUM 9.0  --  9.3 8.7*  MG 2.4  --   --   --    < > = values in this interval not displayed.    Liver Function Tests Recent Labs    11/03/21 1821  AST 37  ALT 18  ALKPHOS 57  BILITOT 2.5*  PROT 7.9  ALBUMIN 3.8    No results for input(s): LIPASE, AMYLASE in the last 72 hours. Cardiac Enzymes No results for input(s): CKTOTAL, CKMB, CKMBINDEX, TROPONINI in the last 72 hours.  BNP: BNP (last 3 results) Recent Labs    09/12/21 1238 09/27/21 1546 09/30/21 0241  BNP 1,141.4* 953.2* 840.7*     ProBNP (last 3 results) Recent Labs    07/04/21 1607 08/16/21 1620  PROBNP 425.0* 601.0*      D-Dimer No results for input(s): DDIMER in the last 72 hours. Hemoglobin A1C No results for input(s): HGBA1C in the last 72 hours. Fasting Lipid Panel No results for input(s): CHOL, HDL, LDLCALC, TRIG, CHOLHDL, LDLDIRECT in the last 72 hours. Thyroid Function Tests  No results for input(s): TSH, T4TOTAL, T3FREE, THYROIDAB in the last 72 hours.  Invalid input(s): FREET3  Other results:   Imaging    No results found.   Medications:     Scheduled Medications:  aspirin EC  81 mg Oral q AM   dapagliflozin propanediol  10 mg Oral QAC breakfast   docusate sodium  100 mg Oral Daily   furosemide  80 mg Intravenous BID   insulin aspart  0-6 Units Subcutaneous TID WC   loratadine  10 mg Oral Daily   macitentan  10 mg Oral Daily   midodrine  15 mg Oral TID WC   rosuvastatin  5 mg Oral Daily   Selexipag  200 mcg Oral QHS   Selexipag  400 mcg Oral Daily   sodium chloride flush  3 mL Intravenous Q12H   spironolactone  25 mg Oral Daily   tadalafil (PAH)  40 mg Oral Daily   warfarin  2.5 mg Oral ONCE-1600   Warfarin - Pharmacist Dosing Inpatient   Does not apply q1600    Infusions:  sodium chloride       PRN Medications: sodium chloride, acetaminophen, lip balm, sodium chloride flush    Assessment/Plan   1. Acute on Chronic Combined Systolic and Diastolic HF - Admitted 67/61/95 for IV diuresis after moderately elevated filling pressures on RHC.  - Echo 10/22 EF 45-50% RV low normal  - Readmitted w/ volume overload after running out of diuretics x 4 days, c/b AKI and hyponatremia - Diuresing on IV Lasix 80 mg bid. But weight up. (? Accurate) Will continue IV lasix today. Add tolvaptan for hyponatremia - Continue Farxiga 10 mg daily. - Continue spiro 25 mg daily  - off ? blocker due to h/o bradycardia/pauses - no ARB/ARNi w/ CKD  - Fluid Restrict - Follow BMP    2. Hyponatremia - Hypervolemic Hyponatremia, Na 122 on admit  - Tolvaptan 15 given 11/10 and 11/11 - Na 122 -> 128 -> 123 today  - Free water restrict  - Repeat tolvaptan - Persistent hyponatremia poor prognostic marker.    3. AKI on CKD, Stage IIIa  - Baseline SCr 1.5-1.8 - SCr 2.12 on admit. Scr 1.45 today - Suspect cardiorenal  - Daily BMET   4. Pulmonary hypertension with cor pulmonale/RV failure - WHO Group I & II - Auto-immune serologies negative (checked twice). - ? Component of HHT/shunt/AVMs with late bubbles on bubble study.  - Echo with bubble study (05/16/18): EF 55-60% with "late bubbles" , Mechanical AVR stable with trivial perivalvular regurg, Mild MR, Severe LAE, Severe RV dilation and reduced function. No PFO. Mod TR, PA peak pressure 68 mm Hg - Echo (4/22): EF 50-55%, RV ok, severe RVSP 61.6 mmHg, severe biatrial enlargement, moderate TR, stable AVR, AV mean gradient 11 mmHg - Echo 10/22 EF 45-50 RV low normal  - Ab u/s with cirrhosis but no evidence of portal HTN. - On triple therapy with Macitentan, Claud Kelp. Uptravi dose at 400/200 limited by cramping.  Numbers not c/w need for IV therapies - no change   5. Chronic Respiratory Failure - [place] CT 05/16/18 with "dependent basilar  predominant patchy subpleural reticulation and ground-glass attenuation with the suggestion of minimal associated traction bronchiolectasis. No frank honeycombing. Findings may indicate an interstitial lung disease such as early usual interstitial pneumonia (UIP) or fibrotic phase nonspecific interstitial pneumonia (NSIP)." - Followed by Dr. Lamonte Sakai.   6. Chronic AFL/Pauses - Rate controlled. Now off metoprolol.  - Multiple  pauses 2-3 sec during recent admit 10/22. - Keep off beta blocker.  - Continue coumadin with mechanical AVR. INR 2.3 today  - Discussed dosing with PharmD personally.   7. Frequent PVCs - This may be contributing to HF - Repeat echo stable  - ~15/min on tele - keep K > 4.0 and Mg > 4.0 - monitor, may need amio vs mexiletine    8. CAD - s/p CABG 07/2017 with Dr Roderic Palau in [place].  - No s/s ischemia - Continue Crestor 5 mg daily - Lipids followed by Dr. Bettina Gavia.   9. S/p mechanical AVR - Stable on Echo 4/22. - On coumadin. INR 2.5 Discussed dosing with PharmD personally. - Aware of need for SBE prophylaxis.   10. Leukocytoclastic vasculitis - F/u with Rheumatology.  - No changes.   11. Cirrhosis - US Abdomen RUQ 05/16/18 - s/p cholecystectomy. + hepatic cirrhosis. Mild ascites.  - Liver Doppler 05/16/18 - No hepatic, splenic, or portal venous thrombosis or occlusion. Mild ascites. - Continue midodrine 15 mg bid.   12. LUE Skin Tear - WOC following   13. Anemia - Hgb 9.2 - Iron low. Rec'd feraheme 11/12  14. Hypokalemia - K 3.3. Supp.    Length of Stay: 3  Glori Bickers, MD  11/06/2021, 10:55 AM  Advanced Heart Failure Team Pager [phone] (M-F; 7a - 5p)  Please contact [facility] for night-coverage after hours (5p -7a ) and weekends on [email or web address]

## 2021-11-07 ENCOUNTER — Ambulatory Visit: Payer: Medicare Other | Admitting: Medical

## 2021-11-07 DIAGNOSIS — I5043 Acute on chronic combined systolic (congestive) and diastolic (congestive) heart failure: Secondary | ICD-10-CM | POA: Diagnosis not present

## 2021-11-07 DIAGNOSIS — E871 Hypo-osmolality and hyponatremia: Secondary | ICD-10-CM | POA: Diagnosis not present

## 2021-11-07 LAB — GLUCOSE, CAPILLARY
Glucose-Capillary: 91 mg/dL (ref 70–99)
Glucose-Capillary: 94 mg/dL (ref 70–99)
Glucose-Capillary: 101 mg/dL — ABNORMAL HIGH (ref 70–99)
Glucose-Capillary: 90 mg/dL (ref 70–99)

## 2021-11-07 LAB — BASIC METABOLIC PANEL
Anion gap: 9 (ref 5–15)
BUN: 30 mg/dL — ABNORMAL HIGH (ref 8–23)
CO2: 23 mmol/L (ref 22–32)
Calcium: 9.2 mg/dL (ref 8.9–10.3)
Chloride: 94 mmol/L — ABNORMAL LOW (ref 98–111)
Creatinine, Ser: 1.82 mg/dL — ABNORMAL HIGH (ref 0.61–1.24)
GFR, Estimated: 38 mL/min — ABNORMAL LOW (ref >60)
Glucose, Bld: 86 mg/dL (ref 70–99)
Potassium: 4.4 mmol/L (ref 3.5–5.1)
Sodium: 126 mmol/L — ABNORMAL LOW (ref 135–145)

## 2021-11-07 LAB — PROTIME-INR
INR: 2.5 — ABNORMAL HIGH (ref 0.8–1.2)
Prothrombin Time: 27.3 seconds — ABNORMAL HIGH (ref 11.4–15.2)

## 2021-11-07 MED ORDER — WARFARIN SODIUM 5 MG PO TABS
5.0000 mg | ORAL_TABLET | Freq: Once | ORAL | Status: AC
  Administered 2021-11-07: 5 mg via ORAL
  Filled 2021-11-07: qty 1

## 2021-11-07 MED ORDER — CETIRIZINE HCL 10 MG PO CAPS
10.0000 mg | ORAL_CAPSULE | Freq: Every day | ORAL | Status: DC
  Administered 2021-11-08 – 2021-11-09 (×2): 10 mg via ORAL
  Filled 2021-11-07 (×2): qty 1

## 2021-11-07 NOTE — Progress Notes (Signed)
Mobility Specialist Progress Note:   11/07/21 1151  Mobility  Activity Ambulated in hall  Level of Assistance Standby assist, set-up cues, supervision of patient - no hands on  Assistive Device None  Distance Ambulated (ft) 520 ft  Mobility Ambulated with assistance in hallway  Mobility Response Tolerated well  Mobility performed by Mobility specialist  $Mobility charge 1 Mobility   Pt received in bed willing to participate in mobility. No complaints of pain. Pt returned to EOB with call bell in reach and all needs met.   [facility] Health and safety inspector Phone [phone]

## 2021-11-07 NOTE — Progress Notes (Addendum)
Advanced Heart Failure Rounding Note  PCP-Cardiologist: Dr. Haroldine Laws   Subjective:    11/10: Direct admit for a/c CHF w/ volume overload and hyponatremia, Na 122.   3.5L in UOP yesterday w/ IV Lasix. Net negative 2.6L for the day but wt only down 1 lb.   Na remains low despite tolvaptan, 126.   SCr up 1.45>>1.82 but c/w baseline.   He feels much better today. Slept well last night. Out of bed ambulating the halls. Denies resting and exertional dyspnea. No confusion.    Objective:   Weight Range: 61.6 kg Body mass index is 20.05 kg/m.   Vital Signs:   Temp:  [97.6 F (36.4 C)-98.6 F (37 C)] 98.6 F (37 C) (11/14 0348) Pulse Rate:  [53-64] 64 (11/14 0832) Resp:  [17-20] 17 (11/14 0348) BP: (103-124)/(61-82) 111/66 (11/14 0832) SpO2:  [93 %-100 %] 97 % (11/14 0832) Weight:  [61.6 kg] 61.6 kg (11/14 0000) Last BM Date: 11/06/21  Weight change: Filed Weights   11/05/21 0500 11/06/21 0054 11/07/21 0000  Weight: 60.7 kg 61.9 kg 61.6 kg    Intake/Output:   Intake/Output Summary (Last 24 hours) at 11/07/2021 0855 Last data filed at 11/07/2021 0500 Gross per 24 hour  Intake 700 ml  Output 3500 ml  Net -2800 ml      Physical Exam    General:  Thin WM, ambulating the halls. Moving swiftly. No respiratory difficulty HEENT: normal Neck: supple. JVD 8 cm Carotids 2+ bilat; no bruits. No lymphadenopathy or thyromegaly appreciated. Cor: PMI nondisplaced. Irregularly irregular rhythm. 2/6 SEM  Lungs: clear Abdomen: soft, nontender, nondistended. No hepatosplenomegaly. No bruits or masses. Good bowel sounds. Extremities: no cyanosis, clubbing, rash, thin extremities, no edema multiple areas of ecchymoses bilateral UEs Neuro: alert & oriented x 3, cranial nerves grossly intact. moves all 4 extremities w/o difficulty. Affect pleasant.   Telemetry    Chronic Afib, HR 70s. Occasional PVCs. Personally reviewed   Labs    CBC No results for input(s): WBC,  NEUTROABS, HGB, HCT, MCV, PLT in the last 72 hours. Basic Metabolic Panel Recent Labs    11/06/21 1114 11/06/21 2030 11/07/21 0350  NA 126* 128* 126*  K 3.5  --  4.4  CL 90*  --  94*  CO2 26  --  23  GLUCOSE 82  --  86  BUN 27*  --  30*  CREATININE 1.57*  --  1.82*  CALCIUM 9.3  --  9.2   Liver Function Tests Recent Labs    11/06/21 1114  AST 32  ALT 18  ALKPHOS 52  BILITOT 2.1*  PROT 7.7  ALBUMIN 3.5   No results for input(s): LIPASE, AMYLASE in the last 72 hours. Cardiac Enzymes No results for input(s): CKTOTAL, CKMB, CKMBINDEX, TROPONINI in the last 72 hours.  BNP: BNP (last 3 results) Recent Labs    09/12/21 1238 09/27/21 1546 09/30/21 0241  BNP 1,141.4* 953.2* 840.7*    ProBNP (last 3 results) Recent Labs    07/04/21 1607 08/16/21 1620  PROBNP 425.0* 601.0*     D-Dimer No results for input(s): DDIMER in the last 72 hours. Hemoglobin A1C No results for input(s): HGBA1C in the last 72 hours. Fasting Lipid Panel No results for input(s): CHOL, HDL, LDLCALC, TRIG, CHOLHDL, LDLDIRECT in the last 72 hours. Thyroid Function Tests No results for input(s): TSH, T4TOTAL, T3FREE, THYROIDAB in the last 72 hours.  Invalid input(s): FREET3  Other results:   Imaging    No  results found.   Medications:     Scheduled Medications:  aspirin EC  81 mg Oral q AM   dapagliflozin propanediol  10 mg Oral QAC breakfast   docusate sodium  100 mg Oral Daily   furosemide  80 mg Intravenous BID   insulin aspart  0-6 Units Subcutaneous TID WC   loratadine  10 mg Oral Daily   macitentan  10 mg Oral Daily   midodrine  15 mg Oral TID WC   potassium chloride  40 mEq Oral Daily   rosuvastatin  5 mg Oral Daily   Selexipag  200 mcg Oral QHS   Selexipag  400 mcg Oral Daily   sodium chloride flush  3 mL Intravenous Q12H   spironolactone  25 mg Oral Daily   tadalafil (PAH)  40 mg Oral Daily   Warfarin - Pharmacist Dosing Inpatient   Does not apply q1600     Infusions:  sodium chloride      PRN Medications: sodium chloride, acetaminophen, lip balm, sodium chloride flush    Assessment/Plan   1. Acute on Chronic Combined Systolic and Diastolic HF - Admitted 45/40/98 for IV diuresis after moderately elevated filling pressures on RHC.  - Echo 10/22 EF 45-50% RV low normal  - Readmitted w/ volume overload after running out of diuretics x 4 days, c/b AKI and hyponatremia - Diuresing on IV Lasix 80 mg bid though wt only down 3 lb (? Accurate). Recheck ReDs today (41% on admit). If down, plan to transition back to PO torsemide. - Continue Farxiga 10 mg daily. - Continue spiro 25 mg daily  - off ? blocker due to h/o bradycardia/pauses - no ARB/ARNi w/ CKD  - Fluid Restrict - Follow BMP    2. Hyponatremia - Hypervolemic Hyponatremia, Na 122 on admit  - Tolvaptan 15 given 11/10, 11/11 and 11/13 - Na 122 -> 128 -> 123->126->128->126 today. No confusion   - Free water restrict  - Persistent hyponatremia poor prognostic marker.    3. AKI on CKD, Stage IIIa  - Baseline SCr 1.5-1.8 - SCr 2.12 on admit. Scr 1.82 today - Suspect cardiorenal  - Daily BMET   4. Pulmonary hypertension with cor pulmonale/RV failure - WHO Group I & II - Auto-immune serologies negative (checked twice). - ? Component of HHT/shunt/AVMs with late bubbles on bubble study.  - Echo with bubble study (05/16/18): EF 55-60% with "late bubbles" , Mechanical AVR stable with trivial perivalvular regurg, Mild MR, Severe LAE, Severe RV dilation and reduced function. No PFO. Mod TR, PA peak pressure 68 mm Hg - Echo (4/22): EF 50-55%, RV ok, severe RVSP 61.6 mmHg, severe biatrial enlargement, moderate TR, stable AVR, AV mean gradient 11 mmHg - Echo 10/22 EF 45-50 RV low normal  - Ab u/s with cirrhosis but no evidence of portal HTN. - On triple therapy with Macitentan, Claud Kelp. Uptravi dose at 400/200 limited by cramping.  Numbers not c/w need for IV therapies - no  change   5. Chronic Respiratory Failure - [place] CT 05/16/18 with "dependent basilar predominant patchy subpleural reticulation and ground-glass attenuation with the suggestion of minimal associated traction bronchiolectasis. No frank honeycombing. Findings may indicate an interstitial lung disease such as early usual interstitial pneumonia (UIP) or fibrotic phase nonspecific interstitial pneumonia (NSIP)." - Followed by Dr. Lamonte Sakai.   6. Chronic AFL/Pauses - Rate controlled. Now off metoprolol.  - Multiple pauses 2-3 sec during recent admit 10/22. - Keep off beta blocker.  - Continue coumadin  with mechanical AVR. INR 2.5 today  - Discussed dosing with PharmD personally.   7. Frequent PVCs - This may be contributing to HF - Repeat echo stable  - few PVCs on tele today  - keep K > 4.0 and Mg > 4.0   8. CAD - s/p CABG 07/2017 with Dr Roderic Palau in [place].  - No s/s ischemia - Continue Crestor 5 mg daily - Lipids followed by Dr. Bettina Gavia.   9. S/p mechanical AVR - Stable on Echo 4/22. - On coumadin. INR 2.5 Discussed dosing with PharmD personally. - Aware of need for SBE prophylaxis.   10. Leukocytoclastic vasculitis - F/u with Rheumatology.  - No changes.   11. Cirrhosis - US Abdomen RUQ 05/16/18 - s/p cholecystectomy. + hepatic cirrhosis. Mild ascites.  - Liver Doppler 05/16/18 - No hepatic, splenic, or portal venous thrombosis or occlusion. Mild ascites. - Continue midodrine 15 mg bid.   12. LUE Skin Tear - WOC following   13. Anemia - Hgb 9.2 on 11/11 - Iron low. Rec'd feraheme 11/12  14. Hypokalemia - resolved, K 4.4 today  - monitor w/ diuresis    Length of Stay: [address] PA-C  11/07/2021, 8:55 AM  Advanced Heart Failure Team Pager [phone] (M-F; 7a - 5p)  Please contact [facility] for night-coverage after hours (5p -7a ) and weekends on [email or web address]  Patient seen and examined with the above-signed Advanced Practice Provider and/or Housestaff. I  personally reviewed laboratory data, imaging studies and relevant notes. I independently examined the patient and formulated the important aspects of the plan. I have edited the note to reflect any of my changes or salient points. I have personally discussed the plan with the patient and/or family.  Diuresed well. Breathing better. ReDS down to 27%. (Tends to have R> L HF) Scr up. Remains on midodrine for BP support.   General:  Walking in room No resp difficulty HEENT: normal Neck: supple. JVP 6-7 with prominent v waves Carotids 2+ bilat; no bruits. No lymphadenopathy or thryomegaly appreciated. Cor: PMI nondisplaced. Irregular rate & rhythm. Mech s2 2/6 SEM Lungs: clear Abdomen: soft, nontender, nondistended. No hepatosplenomegaly. No bruits or masses. Good bowel sounds. Extremities: no cyanosis, clubbing, rash, edema Neuro: alert & orientedx3, cranial nerves grossly intact. moves all 4 extremities w/o difficulty. Affect pleasant  He is overdiuresed with AKI. Serum sodium remains low. Will hold diuretics today. Follow renal function closely. INR 2.5 with mechanical AVR. Discussed dosing with PharmD personally.  Glori Bickers, MD  2:15 PM

## 2021-11-07 NOTE — Progress Notes (Signed)
ANTICOAGULATION CONSULT NOTE - Follow Up Consult  Pharmacy Consult for warfarin Indication: atrial fibrillation and mechanical AVR  Allergies  Allergen Reactions   Pantoprazole Sodium Nausea Only    Gets gassy and starting itching like crazy Gets gassy and starting itching like crazy   Valium [Diazepam] Other (See Comments)    HA and Abd pain.   Tape Rash and Other (See Comments)    Surgical tape   Wound Dressing Adhesive Other (See Comments) and Rash    Surgical tape Surgical tape Surgical tape    Patient Measurements: Weight: 61.6 kg (135 lb 12.8 oz)  Vital Signs: Temp: 98.6 F (37 C) (11/14 0348) Temp Source: Oral (11/14 0348) BP: 111/66 (11/14 0832) Pulse Rate: 64 (11/14 0832)  Labs: Recent Labs    11/05/21 0440 11/06/21 0425 11/06/21 1114 11/07/21 0350  LABPROT 24.8* 27.1*  --  27.3*  INR 2.3* 2.5*  --  2.5*  CREATININE 1.62* 1.45* 1.57* 1.82*    Estimated Creatinine Clearance: 31 mL/min (A) (by C-G formula based on SCr of 1.82 mg/dL (H)).  Assessment: 75yom with PAH EF 45% and permanent AF admitted with volume overload. PTA warfarin regimen =  54m MWF, 2.546mTTSS   INR 2.5 today, within goal. CBC has been stable and no s/sx of bleeding noted per chart review.  Goal of Therapy:  INR 2.5-3 Monitor platelets by anticoagulation protocol: Yes   Plan:  Warfarin 5 mg x1 - usual dose Daily INR Monitor for s/sx of bleeding  Thank you for including pharmacy in the care of this patient.  EmLaurey ArrowPharmD PGY1 Pharmacy Resident 11/07/2021  9:04 AM  Please check [email or web address] for unit-specific pharmacy phone numbers.

## 2021-11-07 NOTE — Discharge Summary (Addendum)
Advanced Heart Failure Team  Discharge Summary   Patient ID: Lance Hicks MRN: [id number], DOB/AGE: March 28, 1946 75 y.o. 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 Also has COPD (quit smoking in 2002 - 1 ppd x 25 years), PAH and cor pulmonale, HL, HTN and h/o gallstone pancreatitis s/p lap cholecystectomy in 2018.   Underwent RHC on 01/18/18 with severe PAH and cor pulmonale. Macitentan and Adcirca. Subsequently started on selexipag but dose limited by cramping now on 400/200.   Admitted from the cath lab on 09/29/21 with elevated filling pressures. RHC with moderate PAH and elevated filling pressures. Started on IV lasix then transitioned to torsemide. Torsemide held due to creatinine bump with plans to restart 3 days after discharge. INR goal lowered due to ongoing severe ecchymosis/purpura. Hospitalization c/b hyponatremia requiring tolvaptan. Weight 143 lbs.   Had post hospital HF on 10/21 and still w/ mild fluid overload and [name] was added. Instructed to return in 10 days for f/u BMP. Presented back on 11/9 for labs and BMP showed hyponatremia w/ Na 121. Instructed to return for repeat BMP on 11/10 and complained of feeling worse and added on to provider clinic as work-in. He reported running out of his torsemide and metolazone 4 days prior and had issues obtaining refills from the  pharmacy. Reported increased LEE and dyspnea. Wt up 4 lb today. ReDs Clip elevated at 41%. NYHA Class III + fatigue. Repeat BMP showed Na 122.  Scr 2.12 (up from 1.6). K 4.5. He was directly admitted for IV diuretics + tolvaptan.   He had good response to IV Lasix. After diuresis, transitioned back to home torsemide 40 mg bid. Required several doses of tolvaptan 11/10, 11/11,  11/13 and 11/08/21. Na improved, 129 day of d/c. HFTs remained stable. Also given feraheme for IDA.  Home health RN arranged.   Last seen and examined by Dr. Haroldine Laws on 11/16 and felt stable for d/c home. Post hospital f/u arranged in the [facility] on 11/22.    Discharge Weight: 136 pounds   Discharge Vitals: Blood pressure 124/82, pulse 68, temperature 97.9 F (36.6 C), temperature source Oral, resp. rate 17, weight 61.7 kg, SpO2 94 %.  Labs: Lab Results  Component Value Date   WBC 2.9 (L) 11/04/2021   HGB 9.2 (L) 11/04/2021   HCT 28.9 (L) 11/04/2021   MCV 75.1 (L) 11/04/2021   PLT 132 (L) 11/04/2021    Recent Labs  Lab 11/06/21 1114 11/06/21 2030 11/09/21 0944  NA 126*   < > 129*  K 3.5   < > 4.1  CL 90*   < > 95*  CO2 26   < > 23  BUN 27*   < > 27*  CREATININE 1.57*   < > 1.56*  CALCIUM 9.3   < > 9.3  PROT 7.7  --   --   BILITOT 2.1*  --   --  ALKPHOS 52  --   --   ALT 18  --   --   AST 32  --   --   GLUCOSE 82   < > 115*   < > = values in this interval not displayed.   Lab Results  Component Value Date   CHOL 133 10/12/2020   HDL 44 10/12/2020   LDLCALC 70 10/12/2020   TRIG 111 10/12/2020   BNP (last 3 results) Recent Labs    09/12/21 1238 09/27/21 1546 09/30/21 0241  BNP 1,141.4* 953.2* 840.7*    ProBNP (last 3 results) Recent Labs    07/04/21 1607 08/16/21 1620  PROBNP 425.0* 601.0*     Diagnostic Studies/Procedures   No results found.  Discharge Medications   Allergies as of 11/09/2021       Reactions   Pantoprazole Sodium Nausea Only   Gets gassy and starting  itching like crazy Gets gassy and starting itching like crazy   Valium [diazepam] Other (See Comments)   HA and Abd pain.   Tape Rash, Other (See Comments)   Surgical tape   Wound Dressing Adhesive Other (See Comments), Rash   Surgical tape Surgical tape Surgical tape        Medication List     TAKE these medications    acetaminophen 500 MG tablet Commonly known as: TYLENOL Take 500-1,000 mg by mouth every 6 (six) hours as needed (pain.).   albuterol 108 (90 Base) MCG/ACT inhaler Commonly known as: VENTOLIN HFA Inhale 2 puffs into the lungs every 6 (six) hours as needed for wheezing or shortness of breath.   ammonium lactate 12 % lotion Commonly known as: LAC-HYDRIN Apply 1 application topically as needed for dry skin.   aspirin EC 81 MG tablet Take 81 mg by mouth in the morning.   dapagliflozin propanediol 10 MG Tabs tablet Commonly known as: Farxiga Take 1 tablet (10 mg total) by mouth daily before breakfast.   famotidine-calcium carbonate-magnesium hydroxide 10-800-165 MG chewable tablet Commonly known as: PEPCID COMPLETE Chew 2 tablets by mouth daily as needed (indigestion/heartburn).   KRILL OIL PO Take 800 mg by mouth every other day. In the morning   Magnesium 200 MG Chew Chew 400 mg by mouth daily at 12 noon.   metolazone 2.5 MG tablet Commonly known as: ZAROXOLYN Take 1 tablet (2.5 mg total) by mouth as needed (for fluid). What changed: See the new instructions.   midodrine 5 MG tablet Commonly known as: PROAMATINE Take 3 tablets (15 mg total) by mouth 3 (three) times daily with meals.   Opsumit 10 MG tablet Generic drug: macitentan TAKE 1 TABLET DAILY   potassium chloride 10 MEQ tablet Commonly known as: KLOR-CON Take 4 tablets (40 mEq total) by mouth 2 (two) times daily. Please dispense 10 meq tablets (take 40 meq bid)   QUNOL ULTRA COQ10 PO Take 1 capsule by mouth in the morning.   rosuvastatin 5 MG tablet Commonly known as:  CRESTOR TAKE 1 TABLET(5 MG) BY MOUTH DAILY   spironolactone 25 MG tablet Commonly known as: ALDACTONE Take 1 tablet (25 mg total) by mouth daily.   tadalafil (PAH) 20 MG tablet Commonly known as: ADCIRCA Take 40 mg by mouth in the morning.   TART CHERRY PO Take 2 tablets by mouth daily at 12 noon. HumanN Tart Cherry Gummy by [employer]   torsemide 20 MG tablet Commonly known as: Demadex Take 2 tablets (40 mg total) by mouth 2 (two) times daily.   Turmeric  500 MG Tabs Take 1 capsule by mouth daily in the afternoon. With Ginger 50 mg   Uptravi 400 MCG Tabs Generic drug: Selexipag Take 400 mcg by mouth in the morning.   Uptravi 200 MCG Tabs Generic drug: Selexipag TAKE 1 TABLET (200 MCG TOTAL) BY MOUTH EVERY EVENING.   vitamin B-12 1000 MCG tablet Commonly known as: CYANOCOBALAMIN Take 1,000 mcg by mouth daily at 12 noon.   VITAMIN C GUMMIE PO Take 2 tablets by mouth daily at 12 noon.   Vitamin D3 50 MCG (2000 UT) Tabs Take 2,000 Units by mouth daily at 12 noon.   warfarin 5 MG tablet Commonly known as: COUMADIN Take as directed. If you are unsure how to take this medication, talk to your nurse or doctor. Original instructions: TAKE 1/2 DAILY DAILY AS DIRECTED BY COUMADIN CLINIC What changed: additional instructions   Zinc 50 MG Tabs Take 50 mg by mouth every other day. In the afternoon               Durable Medical Equipment  (From admission, onward)           Start     Ordered   11/09/21 1210  Heart failure home health orders  (Heart failure home health orders / Face to face)  Once       Comments: Heart Failure Follow-up Care:  Verify follow-up appointments per Patient Discharge Instructions. Confirm transportation arranged. Reconcile home medications with discharge medication list. Remove discontinued medications from use. Assist patient/caregiver to manage medications using pill box. Reinforce low sodium food selection Assessments: Vital signs  and oxygen saturation at each visit. Assess home environment for safety concerns, caregiver support and availability of low-sodium foods. Consult Education officer, museum, PT/OT, Dietitian, and CNA based on assessments. Perform comprehensive cardiopulmonary assessment. Notify MD for any change in condition or weight gain of 3 pounds in one day or 5 pounds in one week with symptoms. Daily Weights and Symptom Monitoring: Ensure patient has access to scales. Teach patient/caregiver to weigh daily before breakfast and after voiding using same scale and record.    Teach patient/caregiver to track weight and symptoms and when to notify Provider. Activity: Develop individualized activity plan with patient/caregiver.   Question Answer Comment  Heart Failure Follow-up Care [facility] ([facility]) [facility] at [phone]   Obtain the following labs Basic Metabolic Panel   Lab frequency Weekly   Fax lab results to AHF Clinic at [phone]   Diet Low Sodium Heart Healthy   Fluid restrictions: 1500 mL Fluid   Skilled Nurse to notify MD of weight trends weekly for first 2 weeks. May fax or call: [facility] Clinic at [phone] (fax) or [phone]      11/09/21 1210            Disposition   The patient will be discharged in stable condition to home. Discharge Instructions     (HEART FAILURE PATIENTS) Call MD:  Anytime you have any of the following symptoms: 1) 3 pound weight gain in 24 hours or 5 pounds in 1 week 2) shortness of breath, with or without a dry hacking cough 3) swelling in the hands, feet or stomach 4) if you have to sleep on extra pillows at night in order to breathe.   Complete by: As directed    Diet - low sodium heart healthy   Complete by: As directed    Heart Failure patients record your daily weight using the same scale at the same time of  day   Complete by: As directed    Increase activity slowly   Complete by: As directed        Follow-up Information     [facility] Follow up on 11/15/2021.   Specialty: Cardiology Why: at 3:00 [place] Contact information: [address] [id number] [place] [phone]                  Duration of Discharge Encounter: Greater than 35 minutes   Signed, Nelida Gores  11/09/2021, 12:14 PM  Patient seen and examined with the above-signed Advanced Practice Provider and/or Housestaff. I personally reviewed laboratory data, imaging studies and relevant notes. I independently examined the patient and formulated the important aspects of the plan. I have edited the note to reflect any of my changes or salient points. I have personally discussed the plan with the patient and/or family.  Volume status and hyponatremia improved. He is ready for d/c. Please see rounding note from earlier today for further details.   Has f/u next week in clinic.   Glori Bickers, MD  10:54 PM

## 2021-11-07 NOTE — Care Management Important Message (Signed)
Important Message  Patient Details  Name: COLBY REELS MRN: [id number] Date of Birth: Oct 04, 1946   Medicare Important Message Given:  Yes     Shelda Altes 11/07/2021, 10:28 AM

## 2021-11-08 ENCOUNTER — Telehealth: Payer: Self-pay

## 2021-11-08 DIAGNOSIS — I5043 Acute on chronic combined systolic (congestive) and diastolic (congestive) heart failure: Secondary | ICD-10-CM | POA: Diagnosis not present

## 2021-11-08 DIAGNOSIS — E871 Hypo-osmolality and hyponatremia: Secondary | ICD-10-CM | POA: Diagnosis not present

## 2021-11-08 LAB — BASIC METABOLIC PANEL
Anion gap: 9 (ref 5–15)
Anion gap: 12 (ref 5–15)
BUN: 33 mg/dL — ABNORMAL HIGH (ref 8–23)
BUN: 41 mg/dL — ABNORMAL HIGH (ref 8–23)
CO2: 22 mmol/L (ref 22–32)
CO2: 26 mmol/L (ref 22–32)
Calcium: 9.1 mg/dL (ref 8.9–10.3)
Calcium: 9.7 mg/dL (ref 8.9–10.3)
Chloride: 92 mmol/L — ABNORMAL LOW (ref 98–111)
Chloride: 84 mmol/L — ABNORMAL LOW (ref 98–111)
Creatinine, Ser: 1.5 mg/dL — ABNORMAL HIGH (ref 0.61–1.24)
Creatinine, Ser: 1.9 mg/dL — ABNORMAL HIGH (ref 0.61–1.24)
GFR, Estimated: 49 mL/min — ABNORMAL LOW (ref >60)
GFR, Estimated: 37 mL/min — ABNORMAL LOW (ref >60)
Glucose, Bld: 83 mg/dL (ref 70–99)
Glucose, Bld: 64 mg/dL — ABNORMAL LOW (ref 70–99)
Potassium: 4.3 mmol/L (ref 3.5–5.1)
Potassium: 4.1 mmol/L (ref 3.5–5.1)
Sodium: 123 mmol/L — ABNORMAL LOW (ref 135–145)
Sodium: 122 mmol/L — ABNORMAL LOW (ref 135–145)

## 2021-11-08 LAB — SODIUM: Sodium: 126 mmol/L — ABNORMAL LOW (ref 135–145)

## 2021-11-08 LAB — GLUCOSE, CAPILLARY
Glucose-Capillary: 82 mg/dL (ref 70–99)
Glucose-Capillary: 82 mg/dL (ref 70–99)
Glucose-Capillary: 102 mg/dL — ABNORMAL HIGH (ref 70–99)
Glucose-Capillary: 89 mg/dL (ref 70–99)

## 2021-11-08 LAB — PROTIME-INR
INR: 2.3 — ABNORMAL HIGH (ref 0.8–1.2)
Prothrombin Time: 25.2 seconds — ABNORMAL HIGH (ref 11.4–15.2)

## 2021-11-08 MED ORDER — TRAZODONE HCL 50 MG PO TABS
50.0000 mg | ORAL_TABLET | Freq: Every evening | ORAL | Status: DC | PRN
  Administered 2021-11-08: 50 mg via ORAL
  Filled 2021-11-08: qty 1

## 2021-11-08 MED ORDER — TORSEMIDE 20 MG PO TABS
40.0000 mg | ORAL_TABLET | Freq: Two times a day (BID) | ORAL | Status: DC
  Administered 2021-11-08 – 2021-11-09 (×2): 40 mg via ORAL
  Filled 2021-11-08 (×2): qty 2

## 2021-11-08 MED ORDER — TOLVAPTAN 15 MG PO TABS
15.0000 mg | ORAL_TABLET | Freq: Once | ORAL | Status: AC
  Administered 2021-11-08: 15 mg via ORAL
  Filled 2021-11-08: qty 1

## 2021-11-08 MED ORDER — WARFARIN SODIUM 3 MG PO TABS
3.0000 mg | ORAL_TABLET | Freq: Once | ORAL | Status: AC
  Administered 2021-11-08: 3 mg via ORAL
  Filled 2021-11-08: qty 1

## 2021-11-08 NOTE — Plan of Care (Signed)
  Problem: Clinical Measurements: Goal: Will remain free from infection Outcome: Progressing   Problem: Coping: Goal: Level of anxiety will decrease Outcome: Progressing   Problem: Safety: Goal: Ability to remain free from injury will improve Outcome: Progressing

## 2021-11-08 NOTE — Progress Notes (Signed)
[facility] for warfarin Indication: atrial fibrillation and mechanical AVR  Allergies  Allergen Reactions   Pantoprazole Sodium Nausea Only    Gets gassy and starting itching like crazy Gets gassy and starting itching like crazy   Valium [Diazepam] Other (See Comments)    HA and Abd pain.   Tape Rash and Other (See Comments)    Surgical tape   Wound Dressing Adhesive Other (See Comments) and Rash    Surgical tape Surgical tape Surgical tape    Patient Measurements: Weight: 62.4 kg (137 lb 9.6 oz)  Vital Signs: Temp: 97.8 F (36.6 C) (11/15 0722) Temp Source: Oral (11/15 0722) BP: 114/73 (11/15 0722) Pulse Rate: 64 (11/15 0722)  Labs: Recent Labs    11/06/21 0425 11/06/21 1114 11/07/21 0350 11/08/21 0334  LABPROT 27.1*  --  27.3* 25.2*  INR 2.5*  --  2.5* 2.3*  CREATININE 1.45* 1.57* 1.82* 1.50*    Estimated Creatinine Clearance: 38.1 mL/min (A) (by C-G formula based on SCr of 1.5 mg/dL (H)).  Assessment: 75yom with PAH EF 45% and permanent AF admitted with volume overload. PTA warfarin regimen =  66m MWF, 2.567mTTSS   INR 2.3 today, below goal. CBC has been stable and no s/sx of bleeding noted per chart review.  Goal of Therapy:  INR 2.5-3 Monitor platelets by anticoagulation protocol: Yes   Plan:  Warfarin 3 mg x1 (usual dose 2.5 mg) Daily INR Monitor for s/sx of bleeding  Thank you for including pharmacy in the care of this patient.  EmLaurey ArrowPharmD PGY1 Pharmacy Resident 11/08/2021  9:47 AM  Please check [email or web address] for unit-specific pharmacy phone numbers.

## 2021-11-08 NOTE — Telephone Encounter (Signed)
Pt hospitalized however, I was able to call and speak to pt's sister in-law. Family stated pt was still hospitalized but pt may be discharged within a few days. Instructed to call Coumadin Clinic at discharge and schedule appt for INR to be checked. Verbalized understanding.

## 2021-11-08 NOTE — Plan of Care (Signed)
  Problem: Clinical Measurements: Goal: Respiratory complications will improve Outcome: Progressing Goal: Cardiovascular complication will be avoided Outcome: Progressing

## 2021-11-08 NOTE — Progress Notes (Addendum)
Advanced Heart Failure Rounding Note  PCP-Cardiologist: Dr. Haroldine Laws   Subjective:    11/10: Direct admit for a/c CHF w/ volume overload and hyponatremia, Na 122.  Good response to IV diuretics. Lasix held yesterday. Over diuresed w/ AKI. ReDs clip 27%.   Wt up 2 lb.   SCr better, 1.45>>1.82>>1.50  Na dropped again, 126>>123 today. He is frustrated regarding this. Wants to go home.   Feel "good". Has been ambulating the halls. No resting nor exertional dyspnea. No confusion.   Total fluid intake yesterday was 1.6L. Net negative 786 cc.   Objective:   Weight Range: 62.4 kg Body mass index is 20.32 kg/m.   Vital Signs:   Temp:  [97.4 F (36.3 C)-98.7 F (37.1 C)] 97.8 F (36.6 C) (11/15 0722) Pulse Rate:  [53-67] 64 (11/15 0722) Resp:  [18-20] 18 (11/15 0722) BP: (96-121)/(69-82) 114/73 (11/15 0722) SpO2:  [94 %-100 %] 97 % (11/15 0722) Weight:  [62.4 kg] 62.4 kg (11/15 0432) Last BM Date: 11/07/21  Weight change: Filed Weights   11/06/21 0054 11/07/21 0000 11/08/21 0432  Weight: 61.9 kg 61.6 kg 62.4 kg    Intake/Output:   Intake/Output Summary (Last 24 hours) at 11/08/2021 0935 Last data filed at 11/08/2021 0831 Gross per 24 hour  Intake 2148 ml  Output 2337 ml  Net -189 ml      Physical Exam    General:  thin elderly WM sitting on side of bed No respiratory difficulty HEENT: normal Neck: supple. JVD 8 w/ prominent v waves. Carotids 2+ bilat; no bruits. No lymphadenopathy or thyromegaly appreciated. Cor: PMI nondisplaced. Irregularly irregular rhythm. No rubs, gallops or murmurs. Lungs: clear Abdomen: soft, nontender, nondistended. No hepatosplenomegaly. No bruits or masses. Good bowel sounds. Extremities: thin extremities multiple areas of ecchymoses upper extremities, no clubbing, rash, no edema Neuro: alert & oriented x 3, cranial nerves grossly intact. moves all 4 extremities w/o difficulty. Affect pleasant.  Telemetry    Chronic Afib, HR  80s.  Personally reviewed   Labs    CBC No results for input(s): WBC, NEUTROABS, HGB, HCT, MCV, PLT in the last 72 hours. Basic Metabolic Panel Recent Labs    11/07/21 0350 11/08/21 0334  NA 126* 123*  K 4.4 4.3  CL 94* 92*  CO2 23 22  GLUCOSE 86 83  BUN 30* 33*  CREATININE 1.82* 1.50*  CALCIUM 9.2 9.1   Liver Function Tests Recent Labs    11/06/21 1114  AST 32  ALT 18  ALKPHOS 52  BILITOT 2.1*  PROT 7.7  ALBUMIN 3.5   No results for input(s): LIPASE, AMYLASE in the last 72 hours. Cardiac Enzymes No results for input(s): CKTOTAL, CKMB, CKMBINDEX, TROPONINI in the last 72 hours.  BNP: BNP (last 3 results) Recent Labs    09/12/21 1238 09/27/21 1546 09/30/21 0241  BNP 1,141.4* 953.2* 840.7*    ProBNP (last 3 results) Recent Labs    07/04/21 1607 08/16/21 1620  PROBNP 425.0* 601.0*     D-Dimer No results for input(s): DDIMER in the last 72 hours. Hemoglobin A1C No results for input(s): HGBA1C in the last 72 hours. Fasting Lipid Panel No results for input(s): CHOL, HDL, LDLCALC, TRIG, CHOLHDL, LDLDIRECT in the last 72 hours. Thyroid Function Tests No results for input(s): TSH, T4TOTAL, T3FREE, THYROIDAB in the last 72 hours.  Invalid input(s): FREET3  Other results:   Imaging    No results found.   Medications:     Scheduled Medications:  aspirin  EC  81 mg Oral q AM   Cetirizine HCl  10 mg Oral Daily   dapagliflozin propanediol  10 mg Oral QAC breakfast   docusate sodium  100 mg Oral Daily   insulin aspart  0-6 Units Subcutaneous TID WC   macitentan  10 mg Oral Daily   midodrine  15 mg Oral TID WC   potassium chloride  40 mEq Oral Daily   rosuvastatin  5 mg Oral Daily   Selexipag  200 mcg Oral QHS   Selexipag  400 mcg Oral Daily   sodium chloride flush  3 mL Intravenous Q12H   spironolactone  25 mg Oral Daily   tadalafil (PAH)  40 mg Oral Daily   Warfarin - Pharmacist Dosing Inpatient   Does not apply q1600    Infusions:   sodium chloride      PRN Medications: sodium chloride, acetaminophen, lip balm, sodium chloride flush    Assessment/Plan   1. Acute on Chronic Combined Systolic and Diastolic HF - Admitted 35/46/56 for IV diuresis after moderately elevated filling pressures on RHC.  - Echo 10/22 EF 45-50% RV low normal  - Readmitted w/ volume overload after running out of diuretics x 4 days, c/b AKI and hyponatremia - Diuresed w/ IV Lasix. ReDs clip 27% yesterday (felt over diuresed). Diuretics on held - Restart  home Torsemide today  - Continue Farxiga 10 mg daily. - Continue spiro 25 mg daily  - off ? blocker due to h/o bradycardia/pauses - no ARB/ARNi w/ CKD  - Fluid Restrict, will change to 1200 daily  - Follow BMP    2. Hyponatremia - Hypervolemic Hyponatremia, Na 122 on admit  - Tolvaptan 15 given 11/10, 11/11 and 11/13 - Na 122 -> 128 -> 123->126->128->126->123 today. No confusion   - Give tolvaptan 15 today  - Free water restrict  - Persistent hyponatremia poor prognostic marker.    3. AKI on CKD, Stage IIIa  - Baseline SCr 1.5-1.8 - SCr 2.12 on admit. Scr 1.50 today  - Suspect cardiorenal  - Daily BMET   4. Pulmonary hypertension with cor pulmonale/RV failure - WHO Group I & II - Auto-immune serologies negative (checked twice). - ? Component of HHT/shunt/AVMs with late bubbles on bubble study.  - Echo with bubble study (05/16/18): EF 55-60% with "late bubbles" , Mechanical AVR stable with trivial perivalvular regurg, Mild MR, Severe LAE, Severe RV dilation and reduced function. No PFO. Mod TR, PA peak pressure 68 mm Hg - Echo (4/22): EF 50-55%, RV ok, severe RVSP 61.6 mmHg, severe biatrial enlargement, moderate TR, stable AVR, AV mean gradient 11 mmHg - Echo 10/22 EF 45-50 RV low normal  - Ab u/s with cirrhosis but no evidence of portal HTN. - On triple therapy with Macitentan, Claud Kelp. Uptravi dose at 400/200 limited by cramping.  Numbers not c/w need for IV therapies -  no change   5. Chronic Respiratory Failure - High Rest CT 05/16/18 with "dependent basilar predominant patchy subpleural reticulation and ground-glass attenuation with the suggestion of minimal associated traction bronchiolectasis. No frank honeycombing. Findings may indicate an interstitial lung disease such as early usual interstitial pneumonia (UIP) or fibrotic phase nonspecific interstitial pneumonia (NSIP)." - Followed by Dr. Lamonte Sakai.   6. Chronic AFL/Pauses - Rate controlled. Now off metoprolol.  - Multiple pauses 2-3 sec during recent admit 10/22. - Keep off beta blocker.  - Continue coumadin with mechanical AVR. INR 2.3 today  - Discussed dosing with PharmD personally.   7.  Frequent PVCs - This may be contributing to HF - Repeat echo stable  - few PVCs on tele today  - keep K > 4.0 and Mg > 4.0   8. CAD - s/p CABG 07/2017 with Dr Roderic Palau in [place].  - No s/s ischemia - Continue Crestor 5 mg daily - Lipids followed by Dr. Bettina Gavia.   9. S/p mechanical AVR - Stable on Echo 4/22. - On coumadin. INR 2.3 Discussed dosing with PharmD personally. - Aware of need for SBE prophylaxis.   10. Leukocytoclastic vasculitis - F/u with Rheumatology.  - No changes.   11. Cirrhosis - US Abdomen RUQ 05/16/18 - s/p cholecystectomy. + hepatic cirrhosis. Mild ascites.  - Liver Doppler 05/16/18 - No hepatic, splenic, or portal venous thrombosis or occlusion. Mild ascites. - Continue midodrine 15 mg bid.   12. LUE Skin Tear - WOC following   13. Anemia - Hgb 9.2 on 11/11 - Iron low. Rec'd feraheme 11/12  14. Hypokalemia - resolved, K 4.3 today  - monitor w/ diuresis    Length of Stay: [address] PA-C  11/08/2021, 9:35 AM  Advanced Heart Failure Team Pager [phone] (M-F; 7a - 5p)  Please contact [facility] for night-coverage after hours (5p -7a ) and weekends on [email or web address]   Patient seen and examined with the above-signed Advanced Practice Provider and/or Housestaff. I  personally reviewed laboratory data, imaging studies and relevant notes. I independently examined the patient and formulated the important aspects of the plan. I have edited the note to reflect any of my changes or salient points. I have personally discussed the plan with the patient and/or family.  Feels good. Wants to go home. Denies SOB, orthopnea or PND. Scr better but serum NA back down to 123  General:  Walking around room  No resp difficulty HEENT: normal Neck: supple. JVP 8 + prominent v waves Carotids 2+ bilat; no bruits. No lymphadenopathy or thryomegaly appreciated. Cor: PMI nondisplaced. Irregular rate & rhythm. 2/6 SEM mechanical s2 Lungs: clear Abdomen: soft, nontender, nondistended. No hepatosplenomegaly. No bruits or masses. Good bowel sounds. Extremities: no cyanosis, clubbing, rash, edema + varicose veins  Neuro: alert & orientedx3, cranial nerves grossly intact. moves all 4 extremities w/o difficulty. Affect pleasant  Stable from HF perspective but serum sodium quite low. Need to hold d/c. Give tolvaptan. Resum home torsemide. INR 2.3   Glori Bickers, MD  10:48 AM

## 2021-11-09 DIAGNOSIS — E871 Hypo-osmolality and hyponatremia: Secondary | ICD-10-CM | POA: Diagnosis not present

## 2021-11-09 DIAGNOSIS — I5043 Acute on chronic combined systolic (congestive) and diastolic (congestive) heart failure: Secondary | ICD-10-CM | POA: Diagnosis not present

## 2021-11-09 LAB — BASIC METABOLIC PANEL
Anion gap: 11 (ref 5–15)
BUN: 27 mg/dL — ABNORMAL HIGH (ref 8–23)
CO2: 23 mmol/L (ref 22–32)
Calcium: 9.3 mg/dL (ref 8.9–10.3)
Chloride: 95 mmol/L — ABNORMAL LOW (ref 98–111)
Creatinine, Ser: 1.56 mg/dL — ABNORMAL HIGH (ref 0.61–1.24)
GFR, Estimated: 46 mL/min — ABNORMAL LOW (ref >60)
Glucose, Bld: 115 mg/dL — ABNORMAL HIGH (ref 70–99)
Potassium: 4.1 mmol/L (ref 3.5–5.1)
Sodium: 129 mmol/L — ABNORMAL LOW (ref 135–145)

## 2021-11-09 LAB — GLUCOSE, CAPILLARY
Glucose-Capillary: 80 mg/dL (ref 70–99)
Glucose-Capillary: 98 mg/dL (ref 70–99)

## 2021-11-09 LAB — PROTIME-INR
INR: 2.4 — ABNORMAL HIGH (ref 0.8–1.2)
Prothrombin Time: 26.2 seconds — ABNORMAL HIGH (ref 11.4–15.2)

## 2021-11-09 MED ORDER — TORSEMIDE 20 MG PO TABS: 40.0000 mg | ORAL_TABLET | Freq: Two times a day (BID) | ORAL | 4 refills | Status: DC

## 2021-11-09 MED ORDER — METOLAZONE 2.5 MG PO TABS: 2.5000 mg | ORAL_TABLET | ORAL | 3 refills | Status: DC | PRN

## 2021-11-09 NOTE — Progress Notes (Addendum)
Advanced Heart Failure Rounding Note  PCP-Cardiologist: Dr. Haroldine Laws   Subjective:    11/10: Direct admit for a/c CHF w/ volume overload and hyponatremia, Na 122.  Good diuresis w/ IV Lasix. Back on PO torsemide. Volume/ Breathing improved.   Na improved today, 129. SCr 1.56 c/w baseline.   Eager to go home.   Objective:   Weight Range: 61.7 kg Body mass index is 20.1 kg/m.   Vital Signs:   Temp:  [97.4 F (36.3 C)-97.9 F (36.6 C)] 97.9 F (36.6 C) (11/16 0729) Pulse Rate:  [52-107] 68 (11/16 0729) Resp:  [17-18] 17 (11/16 0729) BP: (91-124)/(68-82) 124/82 (11/16 0729) SpO2:  [93 %-100 %] 94 % (11/16 0729) Weight:  [61.7 kg] 61.7 kg (11/16 0534) Last BM Date: 11/08/21  Weight change: Filed Weights   11/07/21 0000 11/08/21 0432 11/09/21 0534  Weight: 61.6 kg 62.4 kg 61.7 kg    Intake/Output:   Intake/Output Summary (Last 24 hours) at 11/09/2021 1008 Last data filed at 11/09/2021 0900 Gross per 24 hour  Intake 1595 ml  Output 3350 ml  Net -1755 ml      Physical Exam    General:  thin elderly WM. No respiratory difficulty HEENT: normal Neck: supple. JVD 8 w/ prominent v waves. Carotids 2+ bilat; no bruits. No lymphadenopathy or thyromegaly appreciated. Cor: PMI nondisplaced. Irregularly irregular rhythm and rate. No rubs, gallops or murmurs. Lungs: CTAB. No wheezing  Abdomen: soft, nontender, nondistended. No hepatosplenomegaly. No bruits or masses. Good bowel sounds. Extremities: no LEE, multiple areas of ecchymoses upper extremities Neuro: alert & oriented x 3, cranial nerves grossly intact. moves all 4 extremities w/o difficulty. Affect pleasant.  Telemetry    Chronic Afib, HR 80s.  Personally reviewed   Labs    CBC No results for input(s): WBC, NEUTROABS, HGB, HCT, MCV, PLT in the last 72 hours. Basic Metabolic Panel Recent Labs    11/08/21 0334 11/08/21 1431 11/08/21 1800  NA 123* 122* 126*  K 4.3 4.1  --   CL 92* 84*  --   CO2  22 26  --   GLUCOSE 83 64*  --   BUN 33* 41*  --   CREATININE 1.50* 1.90*  --   CALCIUM 9.1 9.7  --    Liver Function Tests Recent Labs    11/06/21 1114  AST 32  ALT 18  ALKPHOS 52  BILITOT 2.1*  PROT 7.7  ALBUMIN 3.5   No results for input(s): LIPASE, AMYLASE in the last 72 hours. Cardiac Enzymes No results for input(s): CKTOTAL, CKMB, CKMBINDEX, TROPONINI in the last 72 hours.  BNP: BNP (last 3 results) Recent Labs    09/12/21 1238 09/27/21 1546 09/30/21 0241  BNP 1,141.4* 953.2* 840.7*    ProBNP (last 3 results) Recent Labs    07/04/21 1607 08/16/21 1620  PROBNP 425.0* 601.0*     D-Dimer No results for input(s): DDIMER in the last 72 hours. Hemoglobin A1C No results for input(s): HGBA1C in the last 72 hours. Fasting Lipid Panel No results for input(s): CHOL, HDL, LDLCALC, TRIG, CHOLHDL, LDLDIRECT in the last 72 hours. Thyroid Function Tests No results for input(s): TSH, T4TOTAL, T3FREE, THYROIDAB in the last 72 hours.  Invalid input(s): FREET3  Other results:   Imaging    No results found.   Medications:     Scheduled Medications:  aspirin EC  81 mg Oral q AM   Cetirizine HCl  10 mg Oral Daily   dapagliflozin propanediol  10  mg Oral QAC breakfast   docusate sodium  100 mg Oral Daily   insulin aspart  0-6 Units Subcutaneous TID WC   macitentan  10 mg Oral Daily   midodrine  15 mg Oral TID WC   potassium chloride  40 mEq Oral Daily   rosuvastatin  5 mg Oral Daily   Selexipag  200 mcg Oral QHS   Selexipag  400 mcg Oral Daily   sodium chloride flush  3 mL Intravenous Q12H   spironolactone  25 mg Oral Daily   tadalafil (PAH)  40 mg Oral Daily   torsemide  40 mg Oral BID   Warfarin - Pharmacist Dosing Inpatient   Does not apply q1600    Infusions:  sodium chloride      PRN Medications: sodium chloride, acetaminophen, lip balm, sodium chloride flush, traZODone    Assessment/Plan   1. Acute on Chronic Combined Systolic and  Diastolic HF - Admitted 16/10/96 for IV diuresis after moderately elevated filling pressures on RHC.  - Echo 10/22 EF 45-50% RV low normal  - Readmitted w/ volume overload after running out of diuretics x 4 days, c/b AKI and hyponatremia - Diuresed w/ IV Lasix. Euvolemic  - Continue Torsemide 40 mg bid  - Continue Farxiga 10 mg daily. - Continue spiro 25 mg daily  - off ? blocker due to h/o bradycardia/pauses - no ARB/ARNi w/ CKD  - Will need to fluid restrict at home. D/w pt today     2. Hyponatremia - Hypervolemic Hyponatremia, Na 122 on admit  - Tolvaptan 15 given 11/10, 11/11,11/13 and 11/15 - Na 122 -> 128 -> 123->126->128->126->123>129 today. No confusion   - Free water restrict  - Persistent hyponatremia poor prognostic marker.    3. AKI on CKD, Stage IIIa  - Baseline SCr 1.5-1.8 - SCr 2.12 on admit. Scr 1.56 today  - Suspect cardiorenal     4. Pulmonary hypertension with cor pulmonale/RV failure - WHO Group I & II - Auto-immune serologies negative (checked twice). - ? Component of HHT/shunt/AVMs with late bubbles on bubble study.  - Echo with bubble study (05/16/18): EF 55-60% with "late bubbles" , Mechanical AVR stable with trivial perivalvular regurg, Mild MR, Severe LAE, Severe RV dilation and reduced function. No PFO. Mod TR, PA peak pressure 68 mm Hg - Echo (4/22): EF 50-55%, RV ok, severe RVSP 61.6 mmHg, severe biatrial enlargement, moderate TR, stable AVR, AV mean gradient 11 mmHg - Echo 10/22 EF 45-50 RV low normal  - Ab u/s with cirrhosis but no evidence of portal HTN. - On triple therapy with Macitentan, Claud Kelp. Uptravi dose at 400/200 limited by cramping.  Numbers not c/w need for IV therapies - no change   5. Chronic Respiratory Failure - [place] CT 05/16/18 with "dependent basilar predominant patchy subpleural reticulation and ground-glass attenuation with the suggestion of minimal associated traction bronchiolectasis. No frank honeycombing.  Findings may indicate an interstitial lung disease such as early usual interstitial pneumonia (UIP) or fibrotic phase nonspecific interstitial pneumonia (NSIP)." - Followed by Dr. Lamonte Sakai.   6. Chronic AFL/Pauses - Rate controlled. Now off metoprolol.  - Multiple pauses 2-3 sec during recent admit 10/22. - Keep off beta blocker.  - Continue coumadin with mechanical AVR. INR 2.4 today  - Discussed dosing with PharmD personally.   7. Frequent PVCs - This may be contributing to HF - Repeat echo stable  - few PVCs on tele today  - keep K > 4.0 and Mg >  4.0   8. CAD - s/p CABG 07/2017 with Dr Roderic Palau in [place].  - No s/s ischemia - Continue Crestor 5 mg daily - Lipids followed by Dr. Bettina Gavia.   9. S/p mechanical AVR - Stable on Echo 4/22. - On coumadin. INR 2.4 Discussed dosing with PharmD personally. - Aware of need for SBE prophylaxis.   10. Leukocytoclastic vasculitis - F/u with Rheumatology.  - No changes.   11. Cirrhosis - US Abdomen RUQ 05/16/18 - s/p cholecystectomy. + hepatic cirrhosis. Mild ascites.  - Liver Doppler 05/16/18 - No hepatic, splenic, or portal venous thrombosis or occlusion. Mild ascites. - Continue midodrine 15 mg bid.   12. LUE Skin Tear - WOC following   13. Anemia - Hgb 9.2 on 11/11 - Iron low. Rec'd feraheme 11/12  14. Hypokalemia - resolved, K 4.1 today  - monitor w/ diuresis   Plan d/c home today. He has f[facility]u in the [facility] next week on 11/22.    Length of Stay: [address], PA-C  11/09/2021, 10:08 AM  Advanced Heart Failure Team Pager [phone] (M-F; 7a - 5p)  Please contact [facility] for night-coverage after hours (5p -7a ) and weekends on [email or web address]  Patient seen and examined with the above-signed Advanced Practice Provider and/or Housestaff. I personally reviewed laboratory data, imaging studies and relevant notes. I independently examined the patient and formulated the important aspects of the plan. I have edited the note to  reflect any of my changes or salient points. I have personally discussed the plan with the patient and/or family.  Denies SOB, orthopnea or PND. Very eager to go home. NA up from 122-> 128 with tolvaptan. SCr improved.   General:  Ambulating room. No resp difficulty HEENT: normal Neck: supple. JVP 7 with prominent v waves. Carotids 2+ bilat; no bruits. No lymphadenopathy or thryomegaly appreciated. Cor: PMI nondisplaced. Irregular rate & rhythm. Mechanical s2 Lungs: clear Abdomen: soft, nontender, nondistended. No hepatosplenomegaly. No bruits or masses. Good bowel sounds. Extremities: no cyanosis, clubbing, rash, edema Neuro: alert & orientedx3, cranial nerves grossly intact. moves all 4 extremities w/o difficulty. Affect pleasant  He is stable. Sodium improved. Volume status stable. Creatinine ok. Will d/c today with close f/u in HF clinic. Meds reviewed with PharmD. F/u arranged for next week in HF Clinic.   Glori Bickers, MD  11:39 AM

## 2021-11-09 NOTE — Discharge Instructions (Addendum)
Information on my medicine - Coumadin   (Warfarin)  This medication education was reviewed with me or my healthcare representative as part of my discharge preparation.   Why was Coumadin prescribed for you? Coumadin was prescribed for you because you have a blood clot or a medical condition that can cause an increased risk of forming blood clots. Blood clots can cause serious health problems by blocking the flow of blood to the heart, lung, or brain. Coumadin can prevent harmful blood clots from forming. As a reminder your indication for Coumadin is:  mechanical valve  What test will check on my response to Coumadin? While on Coumadin (warfarin) you will need to have an INR test regularly to ensure that your dose is keeping you in the desired range. The INR (international normalized ratio) number is calculated from the result of the laboratory test called prothrombin time (PT).  If an INR APPOINTMENT HAS NOT ALREADY BEEN MADE FOR YOU please schedule an appointment to have this lab work done by your health care provider within 7 days. Your INR goal is a number between:  2.5 to 3  What  do you need to  know  About  COUMADIN? Take Coumadin (warfarin) exactly as prescribed by your healthcare provider about the same time each day.  DO NOT stop taking without talking to the doctor who prescribed the medication.  Stopping without other blood clot prevention medication to take the place of Coumadin may increase your risk of developing a new clot or stroke.  Get refills before you run out.  What do you do if you miss a dose? If you miss a dose, take it as soon as you remember on the same day then continue your regularly scheduled regimen the next day.  Do not take two doses of Coumadin at the same time.  Important Safety Information A possible side effect of Coumadin (Warfarin) is an increased risk of bleeding. You should call your healthcare provider right away if you experience any of the  following: Bleeding from an injury or your nose that does not stop. Unusual colored urine (red or dark brown) or unusual colored stools (red or black). Unusual bruising for unknown reasons. A serious fall or if you hit your head (even if there is no bleeding).  Some foods or medicines interact with Coumadin (warfarin) and might alter your response to warfarin. To help avoid this: Eat a balanced diet, maintaining a consistent amount of Vitamin K. Notify your provider about major diet changes you plan to make. Avoid alcohol or limit your intake to 1 drink for women and 2 drinks for men per day. (1 drink is 5 oz. wine, 12 oz. beer, or 1.5 oz. liquor.)  Make sure that ANY health care provider who prescribes medication for you knows that you are taking Coumadin (warfarin).  Also make sure the healthcare provider who is monitoring your Coumadin knows when you have started a new medication including herbals and non-prescription products.  Coumadin (Warfarin)  Major Drug Interactions  Increased Warfarin Effect Decreased Warfarin Effect  Alcohol (large quantities) Antibiotics (esp. Septra/Bactrim, Flagyl, Cipro) Amiodarone (Cordarone) Aspirin (ASA) Cimetidine (Tagamet) Megestrol (Megace) NSAIDs (ibuprofen, naproxen, etc.) Piroxicam (Feldene) Propafenone (Rythmol SR) Propranolol (Inderal) Isoniazid (INH) Posaconazole (Noxafil) Barbiturates (Phenobarbital) Carbamazepine (Tegretol) Chlordiazepoxide (Librium) Cholestyramine (Questran) Griseofulvin Oral Contraceptives Rifampin Sucralfate (Carafate) Vitamin K   Coumadin (Warfarin) Major Herbal Interactions  Increased Warfarin Effect Decreased Warfarin Effect  Garlic Ginseng Ginkgo biloba Coenzyme Q10 Green tea St. John's wort  Coumadin (Warfarin) FOOD Interactions  Eat a consistent number of servings per week of foods HIGH in Vitamin K (1 serving =  cup)  Collards (cooked, or boiled & drained) Kale (cooked, or boiled &  drained) Mustard greens (cooked, or boiled & drained) Parsley *serving size only =  cup Spinach (cooked, or boiled & drained) Swiss chard (cooked, or boiled & drained) Turnip greens (cooked, or boiled & drained)  Eat a consistent number of servings per week of foods MEDIUM-HIGH in Vitamin K (1 serving = 1 cup)  Asparagus (cooked, or boiled & drained) Broccoli (cooked, boiled & drained, or raw & chopped) Brussel sprouts (cooked, or boiled & drained) *serving size only =  cup Lettuce, raw (green leaf, endive, romaine) Spinach, raw Turnip greens, raw & chopped   These websites have more information on Coumadin (warfarin):  FailFactory.se; VeganReport.com.au;

## 2021-11-09 NOTE — Progress Notes (Signed)
[place] for warfarin Indication: atrial fibrillation and mechanical AVR  Allergies  Allergen Reactions   Pantoprazole Sodium Nausea Only    Gets gassy and starting itching like crazy Gets gassy and starting itching like crazy   Valium [Diazepam] Other (See Comments)    HA and Abd pain.   Tape Rash and Other (See Comments)    Surgical tape   Wound Dressing Adhesive Other (See Comments) and Rash    Surgical tape Surgical tape Surgical tape    Patient Measurements: Weight: 61.7 kg (136 lb 1.6 oz) (scale c)  Vital Signs: Temp: 97.9 F (36.6 C) (11/16 0729) Temp Source: Oral (11/16 0729) BP: 124/82 (11/16 0729) Pulse Rate: 68 (11/16 0729)  Labs: Recent Labs    11/07/21 0350 11/08/21 0334 11/08/21 1431 11/09/21 0234 11/09/21 0944  LABPROT 27.3* 25.2*  --  26.2*  --   INR 2.5* 2.3*  --  2.4*  --   CREATININE 1.82* 1.50* 1.90*  --  1.56*    Estimated Creatinine Clearance: 36.3 mL/min (A) (by C-G formula based on SCr of 1.56 mg/dL (H)).  Assessment: 75yom with PAH EF 45% and permanent AF admitted with volume overload. PTA warfarin regimen =  19m MWF, 2.542mTTSS   INR 2.4 today, slightly < goal. CBC has been stable and no s/sx of bleeding noted per chart review.  Goal of Therapy:  INR 2.5-3 Monitor platelets by anticoagulation protocol: Yes   Plan:  DC home on PTA warfarin dose 2m43mWF/2.2mg60mSS Daily INR Monitor for s/sx of bleeding  LisaBonnita Nasutirm.D. CPP, BCPS Clinical Pharmacist [phone]/2022 11:14 AM     Please check [email or web address] for unit-specific pharmacy phone numbers.

## 2021-11-09 NOTE — TOC CM/SW Note (Addendum)
HF TOC CM charted reviewed. Pt lives alone and readmission. HH orders in for [facility]. Contacted pt to arrange [facility]. Pt agreeable to [facility]. Offered choice for [facility]. Pt agreeable to [facility]. [place] with new referral. Jonnie Finner RN3 CCM, Heart Failure TOC CM [phone]

## 2021-11-14 ENCOUNTER — Telehealth (HOSPITAL_COMMUNITY): Payer: Self-pay | Admitting: *Deleted

## 2021-11-14 NOTE — Telephone Encounter (Signed)
Called pt no answer/left vm requesting return call.

## 2021-11-14 NOTE — Telephone Encounter (Signed)
Received a VM from Soil scientist with [facility] home [facility] stating  pt is refusing all home health services and is no longer answering their phone calls so they are unable to draw labs.  Call back #[phone] if we have any questions

## 2021-11-15 ENCOUNTER — Ambulatory Visit (HOSPITAL_COMMUNITY)
Admit: 2021-11-15 | Discharge: 2021-11-15 | Disposition: A | Discharge status: Home / Self Care | Payer: Medicare Other | Source: Ambulatory Visit | Attending: Family Medicine | Admitting: Family Medicine

## 2021-11-15 ENCOUNTER — Telehealth: Payer: Self-pay

## 2021-11-15 ENCOUNTER — Encounter (HOSPITAL_COMMUNITY): Payer: Self-pay

## 2021-11-15 ENCOUNTER — Telehealth: Payer: Self-pay | Admitting: Gastroenterology

## 2021-11-15 ENCOUNTER — Other Ambulatory Visit: Payer: Self-pay

## 2021-11-15 VITALS — BP 122/80 | HR 61 | Wt 143.0 lb

## 2021-11-15 DIAGNOSIS — Z952 Presence of prosthetic heart valve: Secondary | ICD-10-CM | POA: Diagnosis not present

## 2021-11-15 DIAGNOSIS — M310 Hypersensitivity angiitis: Secondary | ICD-10-CM | POA: Insufficient documentation

## 2021-11-15 DIAGNOSIS — Z7901 Long term (current) use of anticoagulants: Secondary | ICD-10-CM | POA: Diagnosis not present

## 2021-11-15 DIAGNOSIS — D649 Anemia, unspecified: Secondary | ICD-10-CM | POA: Diagnosis not present

## 2021-11-15 DIAGNOSIS — R188 Other ascites: Secondary | ICD-10-CM | POA: Diagnosis not present

## 2021-11-15 DIAGNOSIS — S51019S Laceration without foreign body of unspecified elbow, sequela: Secondary | ICD-10-CM

## 2021-11-15 DIAGNOSIS — I130 Hypertensive heart and chronic kidney disease with heart failure and stage 1 through stage 4 chronic kidney disease, or unspecified chronic kidney disease: Secondary | ICD-10-CM | POA: Insufficient documentation

## 2021-11-15 DIAGNOSIS — Z8616 Personal history of COVID-19: Secondary | ICD-10-CM | POA: Insufficient documentation

## 2021-11-15 DIAGNOSIS — Z09 Encounter for follow-up examination after completed treatment for conditions other than malignant neoplasm: Secondary | ICD-10-CM | POA: Diagnosis not present

## 2021-11-15 DIAGNOSIS — K7469 Other cirrhosis of liver: Secondary | ICD-10-CM

## 2021-11-15 DIAGNOSIS — I2510 Atherosclerotic heart disease of native coronary artery without angina pectoris: Secondary | ICD-10-CM | POA: Insufficient documentation

## 2021-11-15 DIAGNOSIS — I5042 Chronic combined systolic (congestive) and diastolic (congestive) heart failure: Secondary | ICD-10-CM | POA: Insufficient documentation

## 2021-11-15 DIAGNOSIS — N1831 Chronic kidney disease, stage 3a: Secondary | ICD-10-CM | POA: Insufficient documentation

## 2021-11-15 DIAGNOSIS — Z951 Presence of aortocoronary bypass graft: Secondary | ICD-10-CM | POA: Insufficient documentation

## 2021-11-15 DIAGNOSIS — E871 Hypo-osmolality and hyponatremia: Secondary | ICD-10-CM

## 2021-11-15 DIAGNOSIS — I493 Ventricular premature depolarization: Secondary | ICD-10-CM

## 2021-11-15 DIAGNOSIS — J9610 Chronic respiratory failure, unspecified whether with hypoxia or hypercapnia: Secondary | ICD-10-CM | POA: Diagnosis not present

## 2021-11-15 DIAGNOSIS — Z87891 Personal history of nicotine dependence: Secondary | ICD-10-CM | POA: Insufficient documentation

## 2021-11-15 DIAGNOSIS — I25119 Atherosclerotic heart disease of native coronary artery with unspecified angina pectoris: Secondary | ICD-10-CM

## 2021-11-15 DIAGNOSIS — J8489 Other specified interstitial pulmonary diseases: Secondary | ICD-10-CM | POA: Diagnosis not present

## 2021-11-15 DIAGNOSIS — Z79899 Other long term (current) drug therapy: Secondary | ICD-10-CM | POA: Diagnosis not present

## 2021-11-15 DIAGNOSIS — D509 Iron deficiency anemia, unspecified: Secondary | ICD-10-CM

## 2021-11-15 DIAGNOSIS — K7460 Unspecified cirrhosis of liver: Secondary | ICD-10-CM | POA: Insufficient documentation

## 2021-11-15 DIAGNOSIS — R351 Nocturia: Secondary | ICD-10-CM | POA: Diagnosis not present

## 2021-11-15 DIAGNOSIS — I272 Pulmonary hypertension, unspecified: Secondary | ICD-10-CM

## 2021-11-15 DIAGNOSIS — I4510 Unspecified right bundle-branch block: Secondary | ICD-10-CM | POA: Diagnosis not present

## 2021-11-15 DIAGNOSIS — I483 Typical atrial flutter: Secondary | ICD-10-CM

## 2021-11-15 DIAGNOSIS — I5032 Chronic diastolic (congestive) heart failure: Secondary | ICD-10-CM

## 2021-11-15 LAB — BASIC METABOLIC PANEL
Anion gap: 13 (ref 5–15)
BUN: 23 mg/dL (ref 8–23)
CO2: 26 mmol/L (ref 22–32)
Calcium: 10.4 mg/dL — ABNORMAL HIGH (ref 8.9–10.3)
Chloride: 91 mmol/L — ABNORMAL LOW (ref 98–111)
Creatinine, Ser: 1.98 mg/dL — ABNORMAL HIGH (ref 0.61–1.24)
GFR, Estimated: 35 mL/min — ABNORMAL LOW (ref >60)
Glucose, Bld: 91 mg/dL (ref 70–99)
Potassium: 4.5 mmol/L (ref 3.5–5.1)
Sodium: 130 mmol/L — ABNORMAL LOW (ref 135–145)

## 2021-11-15 LAB — PROTIME-INR
INR: 2.4 — ABNORMAL HIGH (ref 0.8–1.2)
Prothrombin Time: 26.3 seconds — ABNORMAL HIGH (ref 11.4–15.2)

## 2021-11-15 LAB — CBC
HCT: 36.6 % — ABNORMAL LOW (ref 39.0–52.0)
Hemoglobin: 11.2 g/dL — ABNORMAL LOW (ref 13.0–17.0)
MCH: 24.7 pg — ABNORMAL LOW (ref 26.0–34.0)
MCHC: 30.6 g/dL (ref 30.0–36.0)
MCV: 80.6 fL (ref 80.0–100.0)
Platelets: 190 10*3/uL (ref 150–400)
RBC: 4.54 MIL/uL (ref 4.22–5.81)
RDW: 24.8 % — ABNORMAL HIGH (ref 11.5–15.5)
WBC: 3.8 10*3/uL — ABNORMAL LOW (ref 4.0–10.5)
nRBC: 0 % (ref 0.0–0.2)

## 2021-11-15 LAB — MAGNESIUM: Magnesium: 2.7 mg/dL — ABNORMAL HIGH (ref 1.7–2.4)

## 2021-11-15 LAB — IRON AND TIBC
Iron: 92 ug/dL (ref 45–182)
Saturation Ratios: 19 % (ref 17.9–39.5)
TIBC: 493 ug/dL — ABNORMAL HIGH (ref 250–450)
UIBC: 401 ug/dL

## 2021-11-15 LAB — FERRITIN: Ferritin: 106 ng/mL (ref 24–336)

## 2021-11-15 NOTE — Progress Notes (Signed)
Advanced Heart Failure Clinic Note   Date:  11/15/2021   PCP:  [name], PA-C  Pulmonology: Dr. Lamonte Sakai Cardiologist:  Dr. Bettina Gavia  HF Cardiologist: Dr. 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 Also has COPD (quit smoking in 2002 - 1 ppd x 25 years), HL, and HTN.  In 8/18 admitted for acute gallstone pancreatitis. Underwent lap cholecystectomy on 07/29/2017. On 8/6 he developed hypotension and was taken back for open laparotomy due to bleeding from an unclear omental source. He was given fluids and 8 units PRBC perioperatively. He says his breathing has been bad since that time.   Seen at [facility] in 2018 had hi-res CT, PFTs and VQ (low prob). Felt to have Golds I COPD with asthma overlap and early pulmonary fibrosis with PAH. He was also seen by Cardiology. Echo 8/18 as below showed normal LV function with septal flattening and RVSP 65-62mHG. 14-day event monitor showed 10-beat run NSVT. Felt to have chronic AF and PAH. No further testing ordered.  Underwent RHC on 01/18/18 with severe PAH and cor pulmonale. Macitentan and Adcirca. Subsequently started on selexipag but dose limited by cramping now on 400/200  Admitted 5/19 with symptomatic hypotension and weight gain. He had a repeat RHC and echo with bubble study (results below). He was started on midodrine for BP support. Diuresed 25 lbs with IV lasix, then transitioned to torsemide 60 mg daily. Abdominal UKoreashowed cirrhosis with no evidence of portal HTN. Pulm consulted for possible ILD on High-res chest CT.  Seen in ED on 01/27/21 due to hypotension due to COVID and diarrhea. He recovered. He had stable NYHA II symptoms at follow up 4/22, repeat echo scheduled (see results below).  [facility], [facility]. [facility]  and [facility]. On doxy for 5 weeks.   Acute visit 08/02/21 feeling poorly, NYHA III symptoms, volume ok. Suspected worsening anxiety driving symptoms.    Seen 09/12/21 with NYHA IIIb-IV symptoms. ReDs 38%. Given 80 m g IV lasix + metolazone 2.5 in clinic. Then arranged for 80 mg IV lasix x 2 days with [facility]. He was seen during his lab appt and he was some better but still had LE edema so we did one more dose of 80 mg IV lasix with [facility], last dose given 09/21/21.   Admitted from the cath lab on 09/29/21 with elevated filling pressures. RHC with moderate PAH and elevated filling pressures. Started on IV lasix then transitioned to torsemide. Torsemide held due to creatinine bump with plans to restart 3 days after discharge. INR goal lowered due to ongoing severe ecchymosis/purpura. Hospitalization c/b hyponatremia requiring tolvaptan. Weight 143 lbs.   Had post hospital HF on 10/21 and still w/ mild fluid overload and FWilder Gladewas added. Labs showed hyponatremia w/ Na 121. Returned to clinic 11/03/21 feeling poorly and was direct admitted with a/c HF and hyponatremia, ReDs 41%. He was diuresed with IV lasix and transition to po torsemide. Required several doses of Tolvaptan and received [name] for IDA. Sodium up to 129 at discharge, weight 136 lbs. [name]  Today he returns for post hospital HF follow up. Breathing is OK, able to get on the TM and walk for 5 minute  increments at a time. He has not been taking [place] since discharge, thought it was stopped. Overall feeling fine. Denies CP, dizziness, edema, or PND/Orthopnea. Appetite ok. No fever or chills. Weight at home 132 pounds. Taking torsemide at midnight and up all night urinating. Limiting fluids.  Cardiac Studies: - RHC 10/22 Findings:   RA = 12 RV = 71/9 PA = 79/20 (38) PCW = 21 (v = 30) Fick cardiac output/index = 7.0/3.14 PVR = 3.1 WU FA sat = 99% PA  sat = 70%, 74% PAPi = 4.9    Assessment: 1. Moderate mixed PAH with mild to moderate volume overload   Echo (4/22): EF 50-55%, RV ok, severe RVSP 61.6 mmHg, severe biatrial enlargement, moderate TR, stable AVR, AV mean gradient 11 mmHg  - Echo (3/21): EF 45-50% AVR ok. Septal flattening RV dilated mild HK. Moderate TR RVSP 80 severe bilateral enlargement.   - RHC 05/14/18 showed Mild/Moderate PAH in setting of high output with no evidence of intracardiac shunting. Hemodynamics as below.    - High-Res Chest CT 05/16/18 1. Slightly irregular 0.9 cm peripheral right middle lobe solid pulmonary nodule. 3 month follow up recommended.  2. Dependent basilar predominant patchy subpleural reticulation and ground-glass attenuation with the suggestion of minimal associated traction bronchiolectasis. No frank honeycombing. Findings may indicate an interstitial lung disease such as early usual interstitial pneumonia (UIP) or fibrotic phase nonspecific interstitial pneumonia (NSIP). Suggest a follow-up high-resolution chest CT study in 6-12 months to assess temporal pattern stability, as clinically warranted. 3. Mild cardiomegaly. Minimal interlobular septal thickening and trace dependent bilateral pleural effusions.  4. Small to moderate volume perihepatic ascites. 5. Ectatic 4.3 cm ascending thoracic aorta. Recommend annual imaging followup by CTA or MRA.  6. Left main and 3 vessel coronary atherosclerosis.   - US Abdomen RUQ 05/16/18 - s/p cholecystectomy. + hepatic cirrhosis. Mild ascites.    - Liver Doppler 05/16/18 - No hepatic, splenic, or portal venous thrombosis or occlusion. Mild ascites.   - Echo with bubble study 05/16/18 LVEF 55-60% with "late bubbles" , Mechanical AVR stable with trivial perivalvular regurg, Mild MR, Severe LAE, Severe RV dilation and reduced function. No PFO. Mod TR, PA peak pressure 68 mm Hg   - RHC 05/14/18 RA = 18 RV = 71/17 PA = 69/24 (39) PCW = 20 Fick  cardiac output/index = 7.0/3.7 Thermo CO/CI = 7.2/3.8 PVR = 2.6 WU Ao sat = 97% PA sat =  73%, 73% SVC sat = 73%  RA sat = 72%  - RHC 01/18/18 RA = 23 RV = 84/21 PA = 81/38 (53) PCW = 22 Fick cardiac output/index = 3.0/1.6 PVR = 10.4 WU FA sat = 98% on 2L  (checked on RA = 93%) PA sat = 60%, 61% SVC sat = 62%  -14 Day Patch 10/01/2017-10/15/2017: -1 run of Ventricular Tachycardia occurred lasting 10 beats with a max rate of 240 bpm (avg 161 bpm). -Atrial Flutter occurred continuously (100% burden), ranging from 55-132 bpm (avg of 87 bpm).   - NM Ventilation and Perfusion Lung Scan 10/31/2017:  Low probability of pulmonary embolism, using the PIOPED criteria.  - PFTs 07/02/2017:       FVC     86% FEV1   85% FEV1/FVC  73% DLCO  8.2, 32%   - 6MW -  1400 feet; 97% to 85% (recovered with resting)  CT Chest 07/02/2017: 1. Stable right middle lobe pulmonary nodule measuring up to 1 cm. 2. Minimal subpleural reticulation without evidence of honeycombing to  suggest pulmonary fibrosis. Stable centrilobular emphysema. 3. Mediastinal lymphadenopathy, increased from prior. 4. Calcified right hilar lymph nodes and multiple calcified granulomas  throughout the right lung consistent with prior granulomatous disease. 5. Ascending thoracic aortic aneurysm measuring up to 4.5 cm, unchanged. 6. Dilatation of the main pulmonary artery suggestive of pulmonary  hypertension. 7. Cardiomegaly and dense atherosclerotic coronary artery calcifications.   Review of systems complete and found to be negative unless listed in HPI.   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 In light of concurrent left nephrolithiasis, will check CT renal colic next year instead of renal  US.    Anticoagulated on Coumadin 01/04/2018   Anxiety 05/10/2016   Last Assessment & Plan:  Doing well off of zoloft.   Asthma    Asymptomatic microscopic hematuria 06/20/2017   Last Assessment & Plan:  Had hematuria workup in [facility] in [facility] which negative CT and cystoscopy. UA with 2+ blood last visit - we discussed recommendation for repeat workup at 5 years or if degree of hematuria progresses.    Atrial fibrillation ([place]) 03/29/2017   Atrial flutter ([place]) 03/29/2017   Backache 12/14/2015   Last Assessment & Plan:  Pain management referral for further evaluation.   Bilateral pleural effusion 08/09/2017   CHF (congestive heart failure) (HCC)    Chronic allergic rhinitis 04/28/2016   Last Assessment & Plan:  Continue astelin   Chronic anticoagulation 03/29/2017   Chronic atrial fibrillation ([place]) 07/23/2015   Last Assessment & Plan:  Coumadin and metoprolol, cardiology referral to establish care.   Chronic midline back pain 12/14/2015   Last Assessment & Plan:  Pain management referral for further evaluation.   Chronic obstructive lung disease ([place]) 06/12/2016   With hypoxia   Chronic prostatitis 07/23/2015   Last Assessment & Plan:  Has largely resolved since stopping bike riding. Recommend annual DRE AND PSA - will see back 12/2015 for annual screening, given 1st degree fhx. To call office for recurrent prostatitis symptoms.    Chronic respiratory failure with hypoxia ([place]) 07/29/2019   Cirrhosis of liver not due to alcohol ([place]) 07/01/2019   COPD (chronic obstructive pulmonary disease) ([place]) 06/12/2016   Coronary arteriosclerosis in native artery 03/29/2017   Coronary artery disease involving native coronary artery of native heart with angina pectoris ([place]) 03/29/2017   Cough 10/17/2016   Last Assessment & Plan:  Discussed typical course for acute viral illness. If symptoms worsen or fail to improve by 7-10d, delayed ATBs, fluids, rest, NSAIDs/APAP prn. Seek care if not improving. Needs earlier INR check due  to ATBs.   Dyspnea 02/01/2016   Last Assessment & Plan:  Overall improving, eval by pulm, plan for CT, neg stress test with cardiology. Recent switch to carvedilol due to side effects.   Encounter for therapeutic drug monitoring 01/06/2019   Enterococcal bacteremia    Epidermoid cyst of skin 08/24/2017   Essential hypertension 12/14/2015   Last Assessment & Plan:  Hypertension control: controlled  Medications: compliant Medication Management: as noted in orders Home blood pressure monitoring recommended additionally as needed for symptoms  The patient's care plan was reviewed and updated. Instructions and  counseling were provided regarding patient goals and barriers. He was counseled to adopt a healthy lifestyle. Educational resources and self-management tools have been provided as charted in [facility] list.    H/O maze procedure 03/29/2017   [name] 03/29/2017   Overview:  2011   History of coronary artery bypass graft 03/29/2017   Hx of CABG 03/29/2017   Hyperlipidemia 03/29/2017   Hypersensitivity angiitis ([place]) 10/01/2017   Hypertensive heart disease 03/29/2017   Hypertensive heart disease with heart failure ([place]) 03/29/2017   Hypertensive heart failure ([place]) 03/29/2017   Hypokalemia due to excessive renal loss of potassium 02/18/2018   Hypotension, chronic 06/06/2018   International normalized ratio (INR) raised 07/26/2017   Kidney stone 07/23/2015   Kidney stones 07/23/2015   Overview:  x 3  Last Assessment & Plan:  By [place] has left nephrolithiasis, but not visible by KUB. Will check CT renal colic next year to assess both stone burden as well as to surveil AML.    Left ureteral stone 01/23/2018   Leukocytoclastic vasculitis ([place]) 10/01/2017   Localized edema 01/04/2018   Long term (current) use of anticoagulants 03/29/2017   Lumbar radicular pain 01/19/2016   Lumbar radiculopathy 01/19/2016   Maculopapular rash 09/03/2017   Microscopic hematuria 06/20/2017   Last Assessment & Plan:  Had  hematuria workup in [facility] in 2016 which negative CT and cystoscopy. UA with 2+ blood last visit - we discussed recommendation for repeat workup at 5 years or if degree of hematuria progresses.    Multiple nodules of lung 06/12/2016   Nasal discharge 02/25/2016   Last Assessment & Plan:  Trial zyrtec and flonase   Nephrolithiasis 07/23/2015   Overview:  x 3  Last Assessment & Plan:  By [place] has left nephrolithiasis, but not visible by KUB. Will check CT renal colic next year to assess both stone burden as well as to surveil AML.   Overview:  x 3  Last Assessment & Plan:  Has 45m nonobstructing LUP stone - not visible by KUB.  Will check renal [place]8/2019 - he will contact office sooner if symptomatic.    Non-sustained ventricular tachycardia 03/29/2017   Nonsustained ventricular tachycardia 03/29/2017   Other hyperlipidemia 03/29/2017   Palpitations 10/01/2017   Pleural effusion, bilateral 08/09/2017   Pneumonia due to COVID-19 virus 02/07/2021   Post-nasal drainage 02/25/2016   Last Assessment & Plan:  Trial zyrtec and flonase   Prostate cancer screening 06/20/2017   Last Assessment & Plan:  Recommend continued annual CaP screening until within 10 years of life expectancy. Given good health and fhx of longevity, would anticipate CaP screening to continue until age 75  PSA today and again in one year on day of visit.   Pulmonary arterial hypertension ([place] 02/20/2018   Pulmonary edema    Pulmonary hypertension ([place] 08/09/2017   Pulmonary nodules 06/12/2016   S/P AVR 03/20/2016   S/P AVR (aortic valve replacement) 03/20/2016   Strain of deltoid muscle, initial encounter    Supratherapeutic INR 07/26/2017   Syncope and collapse 02/01/2016   Typical atrial flutter ([place] 02/01/2016   Past Surgical History:  Procedure Laterality Date   CHOLECYSTECTOMY     CORONARY ARTERY BYPASS GRAFT     EXPLORATORY LAPAROTOMY  07/30/2017   FOOT SURGERY     FRACTURE SURGERY Right    wrist and forearm   HERNIA REPAIR     MECHANICAL  AORTIC VALVE REPLACEMENT     NASAL SINUS SURGERY  RIGHT HEART CATH N/A 01/18/2018   Procedure: RIGHT HEART CATH;  Surgeon: Jolaine Artist, MD;  Location: [place] [facility];  Service: Cardiovascular;  Laterality: N/A;   RIGHT HEART CATH N/A 05/14/2018   Procedure: RIGHT HEART CATH;  Surgeon: Jolaine Artist, MD;  Location: [place] [facility];  Service: Cardiovascular;  Laterality: N/A;   RIGHT HEART CATH N/A 09/29/2021   Procedure: RIGHT HEART CATH;  Surgeon: Jolaine Artist, MD;  Location: [place] [facility];  Service: Cardiovascular;  Laterality: N/A;   TEE WITHOUT CARDIOVERSION N/A 05/21/2018   Procedure: TRANSESOPHAGEAL ECHOCARDIOGRAM (TEE);  Surgeon: Jolaine Artist, MD;  Location: [facility] ENDOSCOPY;  Service: Cardiovascular;  Laterality: N/A;   UPPER GASTROINTESTINAL ENDOSCOPY  07/12/2017   Patchy areas of mucosal inflammation noted in the antrum with edema,erthema and ulcerations. Bx. Chronicfocally active gastritis.   VASECTOMY     Current Medications: Current Meds  Medication Sig   acetaminophen (TYLENOL) 500 MG tablet Take 500-1,000 mg by mouth every 6 (six) hours as needed (pain.).   albuterol (VENTOLIN HFA) 108 (90 Base) MCG/ACT inhaler Inhale 2 puffs into the lungs every 6 (six) hours as needed for wheezing or shortness of breath.   Ascorbic Acid (VITAMIN C GUMMIE PO) Take 2 tablets by mouth daily at 12 noon.   aspirin EC 81 MG tablet Take 81 mg by mouth in the morning.   Cholecalciferol (VITAMIN D3) 50 MCG (2000 UT) TABS Take 2,000 Units by mouth daily at 12 noon.   Coenzyme Q10-Vitamin E (QUNOL ULTRA COQ10 PO) Take 1 capsule by mouth in the morning.   IRON CR PO Take 1 tablet by mouth daily in the afternoon.   KRILL OIL PO Take 800 mg by mouth every other day. In the morning   Magnesium 200 MG CHEW Chew 400 mg by mouth daily at 12 noon.   metolazone (ZAROXOLYN) 2.5 MG tablet Take 1 tablet (2.5 mg total) by mouth as needed (for fluid).   midodrine (PROAMATINE) 5 MG  tablet Take 3 tablets (15 mg total) by mouth 3 (three) times daily with meals.   OPSUMIT 10 MG tablet TAKE 1 TABLET DAILY   potassium chloride (KLOR-CON) 10 MEQ tablet Take 4 tablets (40 mEq total) by mouth 2 (two) times daily. Please dispense 10 meq tablets (take 40 meq bid)   rosuvastatin (CRESTOR) 5 MG tablet TAKE 1 TABLET(5 MG) BY MOUTH DAILY   Selexipag (UPTRAVI) 400 MCG TABS Take 400 mcg by mouth daily in the afternoon. 200 mcg at night   spironolactone (ALDACTONE) 25 MG tablet Take 1 tablet (25 mg total) by mouth daily.   tadalafil, PAH, (ADCIRCA) 20 MG tablet Take 40 mg by mouth in the morning.   TART CHERRY PO Take 0.5 mLs by mouth daily in the afternoon.   torsemide (DEMADEX) 20 MG tablet Take 2 tablets (40 mg total) by mouth 2 (two) times daily.   Turmeric 500 MG TABS Take 1 capsule by mouth daily in the afternoon. With Ginger 50 mg   vitamin B-12 (CYANOCOBALAMIN) 1000 MCG tablet Take 1,000 mcg by mouth daily at 12 noon.   warfarin (COUMADIN) 5 MG tablet TAKE 1/2 DAILY DAILY AS DIRECTED BY COUMADIN CLINIC (Patient taking differently: TAKE 1/2 tab TTS and 1 tab MWF  AS DIRECTED BY COUMADIN CLINIC)   Zinc 50 MG TABS Take 50 mg by mouth every other day. In the afternoon   [DISCONTINUED] UPTRAVI 200 MCG TABS TAKE 1 TABLET (200 MCG TOTAL) BY MOUTH  EVERY EVENING.    Allergies:   Pantoprazole sodium, Valium [diazepam], Tape, and Wound dressing adhesive   Social History   Socioeconomic History   Marital status: Single    Spouse name: Not on file   Number of children: Not on file   Years of education: Not on file   Highest education level: Not on file  Occupational History   Not on file  Tobacco Use   Smoking status: Former    Packs/day: 2.00    Years: 34.00    Pack years: 68.00    Types: Cigarettes    Quit date: 07/16/2000    Years since quitting: 21.3   Smokeless tobacco: Never  Vaping Use   Vaping Use: Never used  Substance and Sexual Activity   Alcohol use: Not Currently    Drug use: No   Sexual activity: Not on file  Other Topics Concern   Not on file  Social History Narrative   Not on file   Social Determinants of Health   Financial Resource Strain: Low Risk    Difficulty of Paying Living Expenses: Not very hard  Food Insecurity: No Food Insecurity   Worried About Running Out of Food in the Last Year: Never true   Ran Out of Food in the Last Year: Never true  Transportation Needs: No Transportation Needs   Lack of Transportation (Medical): No   Lack of Transportation (Non-Medical): No  Physical Activity: Not on file  Stress: Not on file  Social Connections: Not on file    Family History: The patient's family history includes Arthritis in his mother; Asthma in his mother; Heart attack in his father; Hypertension in his father; Stroke in his paternal grandmother. There is no history of Colon cancer.  Recent Labs: 08/16/2021: Pro B Natriuretic peptide (BNP) 601.0 08/17/2021: TSH 2.71 09/30/2021: B Natriuretic Peptide 840.7 11/04/2021: Hemoglobin 9.2; Magnesium 2.4; Platelets 132 11/06/2021: ALT 18 11/09/2021: BUN 27; Creatinine, Ser 1.56; Potassium 4.1; Sodium 129   Recent Lipid Panel    Component Value Date/Time   CHOL 133 10/12/2020 1104   TRIG 111 10/12/2020 1104   HDL 44 10/12/2020 1104   CHOLHDL 3.0 10/12/2020 1104   VLDL 11 10/21/2018 1028   LDLCALC 70 10/12/2020 1104   BP 122/80   Pulse 61   Wt 64.9 kg   SpO2 96%   BMI 21.12 kg/m     Wt Readings from Last 3 Encounters:  11/15/21 64.9 kg  11/09/21 61.7 kg  11/03/21 64.6 kg   PHYSICAL EXAM: General:  NAD. No resp difficulty, chronically-ill appearing, walked into clinic. HEENT: Normal Neck: Supple. JVP 6-7, + v waves. Carotids 2+ bilat; no bruits. No lymphadenopathy or thryomegaly appreciated. Cor: PMI nondisplaced. Irregular rate & rhythm. + mechanical S2, No rubs, gallops, 2/6 SEM Lungs: Clear Abdomen: Soft, nontender, nondistended. No hepatosplenomegaly. No bruits or  masses. Good bowel sounds. Extremities: No cyanosis, clubbing, rash, trace BLE edema, compression hose on. Neuro: Alert & oriented x 3, cranial nerves grossly intact. Moves all 4 extremities w/o difficulty. Affect pleasant.  ECG: Atrial fibrillation 60 bpm w/ PVCs (personally reviewed).  ReDs: 38%  Assessment/Plan 1. Chronic Combined Systolic and Diastolic HF - Admitted 16/01/09 for IV diuresis after moderately elevated filling pressures on RHC.  - Echo 10/22 EF 45-50% RV low normal  - Readmitted w/ volume overload 11/22 after running out of diuretics x 4 days, c/b AKI and hyponatremia - Stable NYHA III, volume mildly up today, ReDs 38%, weight up 7 lbs,  likely due to being off Farxiga x 6 days. - Restart Farxiga 10 mg daily. - Continue Torsemide 40 mg bid. Take at 6 am and 6 pm to help with nocturia.  - Continue spiro 25 mg daily.  - off ? blocker due to h/o bradycardia/pauses. - no ARB/ARNi w/ CKD.  - Will need to continue to fluid restrict.  - BMET today, repeat in 2 weeks.   2. H/o Hyponatremia - Hypervolemic hyponatremia, Na 122 on recent admit, requiring several doses of tolvaptan.  - Currently no confusion.   - Free water restrict.  - Persistent hyponatremia poor prognostic marker.  - Labs today.   3. CKD, Stage IIIa  - Baseline SCr 1.5-1.8 - Recent AKI with SCr 2.12 on admit.  - Suspect cardiorenal. - Labs today.    4. Pulmonary hypertension with cor pulmonale/RV failure - WHO Group I & II - Auto-immune serologies negative (checked twice). - ? Component of HHT/shunt/AVMs with late bubbles on bubble study.  - Echo with bubble study (05/16/18): EF 55-60% with "late bubbles" , Mechanical AVR stable with trivial perivalvular regurg, Mild MR, Severe LAE, Severe RV dilation and reduced function. No PFO. Mod TR, PA peak pressure 68 mm Hg - Echo (4/22): EF 50-55%, RV ok, severe RVSP 61.6 mmHg, severe biatrial enlargement, moderate TR, stable AVR, AV mean gradient 11 mmHg -  Echo 10/22 EF 45-50 RV low normal  - Ab u/s with cirrhosis but no evidence of portal HTN. - On triple therapy with Macitentan, Claud Kelp. Uptravi dose at 400/200 limited by cramping.  Numbers not c/w need for IV therapies - No change   5. Chronic Respiratory Failure - High Rest CT 05/16/18 with "dependent basilar predominant patchy subpleural reticulation and ground-glass attenuation with the suggestion of minimal associated traction bronchiolectasis. No frank honeycombing. Findings may indicate an interstitial lung disease such as early usual interstitial pneumonia (UIP) or fibrotic phase nonspecific interstitial pneumonia (NSIP)." - Followed by Dr. Lamonte Sakai.   6. Chronic AFL/Pauses - Rate controlled. Now off metoprolol.  - Multiple pauses 2-3 sec during recent admit 10/22. - Keep off beta blocker.  - Continue coumadin with mechanical AVR.  - INR today and forward to Coumadin Clinic in [place].   7. Frequent PVCs - This may be contributing to HF. - Repeat echo stable.  - Check mag today.   8. CAD - s/p CABG 07/2017 with Dr Roderic Palau in [place].  - No s/s ischemia. - Continue Crestor 5 mg daily. - Lipids followed by Dr. Bettina Gavia.   9. S/p mechanical AVR - Stable on Echo 4/22. - On Coumadin. Check INR today. - Aware of need for SBE prophylaxis.   10. Leukocytoclastic vasculitis - F/u with Rheumatology.  - No changes.   11. Cirrhosis - US Abdomen RUQ 05/16/18 - s/p cholecystectomy. + hepatic cirrhosis. Mild ascites.  - Liver Doppler 05/16/18 - No hepatic, splenic, or portal venous thrombosis or occlusion. Mild ascites. - Continue midodrine 15 mg bid.    12. LUE Skin Tear - Resolving. - He is using Vaseline and bandaid.    13. Anemia - Recent Hgb 9.2. - Rec'd feraheme 11/05/21. - CBC, Ferratin, Iron +TIBC today.   Follow up w/ Dr. Haroldine Laws next month as scheduled.   Allena Katz, FNP-BC 11/15/21

## 2021-11-15 NOTE — Telephone Encounter (Signed)
Pt states that he has been having acid reflux VERY BAD; States that the Prilosec is not working. Pt states that he cannot take the Omeprazole because it upsets his stomach. Pt requesting something else to be prescribed. Pt states that he is watching everything that he eats but nothing is working.  Please advise

## 2021-11-15 NOTE — Progress Notes (Signed)
ReDS Vest / Clip - 11/15/21 1500       ReDS Vest / Clip   Station Marker C    Ruler Value 29    ReDS Value Range Moderate volume overload    ReDS Actual Value 38

## 2021-11-15 NOTE — Telephone Encounter (Signed)
Transition Care Management Unsuccessful Follow-up Telephone Call  Date of discharge and from where:  11/09/21 from [facility]  Attempts:  1st Attempt  Reason for unsuccessful TCM follow-up call:  Left voice message   Thea Silversmith, RN, MSN, BSN, [place] Care Management Coordinator [phone]

## 2021-11-15 NOTE — Telephone Encounter (Signed)
Inbound call from patient. States he have been experiencing acid reflex and upset stomach for a while and would like to know what he can have to help feel better

## 2021-11-15 NOTE — Patient Instructions (Addendum)
RESTART Farxiga 43m (1 tab) daily  Take Torsemide at 6am and 6pm  Labs today We will only contact you if something comes back abnormal or we need to make some changes. Otherwise no news is good news!  You have been given a prescription for compression hose. [facility] [address] [place] [address] Phone number [phone]    Your physician recommends that you schedule a follow-up appointment in: Keep your next follow up appointment with Dr BHaroldine Laws Do the following things EVERYDAY: Weigh yourself in the morning before breakfast. Write it down and keep it in a log. Take your medicines as prescribed Eat low salt foods--Limit salt (sodium) to 2000 mg per day.  Stay as active as you can everyday Limit all fluids for the day to less than 2 liters  Please call office at [phone] 2 if you have any questions or concerns.   At the [facility] you and your health needs are our priority. As part of our continuing mission to provide you with exceptional heart care, we have created designated Provider Care Teams. These Care Teams include your primary Cardiologist (physician) and Advanced Practice Providers (APPs- Physician Assistants and Nurse Practitioners) who all work together to provide you with the care you need, when you need it.   You may see any of the following providers on your designated Care Team at your next follow up: Dr DGlori BickersDr DHaynes Kerns NP BLyda Jester PUtahJ[facility] CampusLSab[facility] PUtahLAudry Riles PharmD   Please be sure to bring in all your medications bottles to every appointment.

## 2021-11-21 ENCOUNTER — Other Ambulatory Visit: Payer: Self-pay

## 2021-11-21 ENCOUNTER — Telehealth: Payer: Self-pay

## 2021-11-21 DIAGNOSIS — K219 Gastro-esophageal reflux disease without esophagitis: Secondary | ICD-10-CM

## 2021-11-21 MED ORDER — RABEPRAZOLE SODIUM 20 MG PO TBEC
20.0000 mg | DELAYED_RELEASE_TABLET | Freq: Every day | ORAL | 1 refills | Status: DC
Start: 2021-11-21 — End: 2022-01-24

## 2021-11-21 NOTE — Telephone Encounter (Signed)
Pt stated that he was NOT taking the Aciphex. Prescription sent to pt pharmacy. Pt made aware. Pt verbalized understanding with all questions answered.

## 2021-11-21 NOTE — Telephone Encounter (Signed)
Transition Care Management Follow-up Telephone Call Date of discharge and from where: 11/09/21 from [facility] How have you been since you were released from the hospital? "Good" Any questions or concerns? No  Items Reviewed: Did the pt receive and understand the discharge instructions provided? Yes  Medications obtained and verified? Yes  Other? No  Any new allergies since your discharge? No  Dietary orders reviewed? Yes Do you have support at home? Yes   Home Care and Equipment/Supplies: Were home health services ordered? States he has supportive people assist as needed from the church and does not have home health services. If so, what is the name of the agency? Not applicable  Has the agency set up a time to come to the patient's home? not applicable Were any new equipment or medical supplies ordered?  No What is the name of the medical supply agency? Not applicable Were you able to get the supplies/equipment? not applicable Do you have any questions related to the use of the equipment or supplies? No  Functional Questionnaire: (I = Independent and D = Dependent) ADLs: I  Bathing/Dressing- I  Meal Prep- I  Eating- I  Maintaining continence- I  Transferring/Ambulation- I  Managing Meds- I  Follow up appointments reviewed:  PCP Hospital f/u appt confirmed?  Following up with specialist Specialist Hospital f/u appt confirmed? Yes  Scheduled to see Dr. Haroldine Laws on 11/30/2021 @ 1:40 pm. Seen on 11/15/21 by [place]. Are transportation arrangements needed? No  If their condition worsens, is the pt aware to call PCP or go to the Emergency Dept.? Yes Was the patient provided with contact information for the PCP's office or ED? Yes Was to pt encouraged to call back with questions or concerns? Yes    Thea Silversmith, RN, MSN, BSN, [place] Care Management Coordinator [phone]

## 2021-11-21 NOTE — Telephone Encounter (Signed)
He was supposed to be taking Aciphex 20 mg p.o. once a day If he still having problems, can increase Aciphex to 20 mg p.o. twice daily Message says he is taking omeprazole/Prilosec.  If he is, he can switch to Aciphex QD RG

## 2021-11-22 ENCOUNTER — Other Ambulatory Visit (HOSPITAL_COMMUNITY): Payer: Self-pay | Admitting: Internal Medicine

## 2021-11-24 ENCOUNTER — Telehealth: Payer: Self-pay | Admitting: Gastroenterology

## 2021-11-24 NOTE — Telephone Encounter (Signed)
Inbound call from patient states insurance does not approve of Rabeprazole. States the generic brand could get approved

## 2021-11-24 NOTE — Telephone Encounter (Signed)
Appeal faxed. Waiting to hear back

## 2021-11-24 NOTE — Telephone Encounter (Signed)
Call [phone] ID [id number] There is already a denied letter on file from October 13th. Talked with Evon Slack about this and we have to fax an appeal to [phone].  Working on the appeal now

## 2021-11-29 ENCOUNTER — Telehealth: Payer: Self-pay

## 2021-11-29 NOTE — Telephone Encounter (Signed)
Called pt due to INR overdue. Pt stated he had an appt with Dr Haroldine Laws tomorrow and he wanted them to check his INR. I called Clinic and confirmed they will send [facility] his INR results.

## 2021-11-30 ENCOUNTER — Encounter (HOSPITAL_COMMUNITY): Payer: Self-pay | Admitting: Internal Medicine

## 2021-11-30 ENCOUNTER — Ambulatory Visit (HOSPITAL_COMMUNITY)
Admission: RE | Admit: 2021-11-30 | Discharge: 2021-11-30 | Disposition: A | Discharge status: Home / Self Care | Payer: Medicare Other | Source: Ambulatory Visit | Attending: Internal Medicine | Admitting: Internal Medicine

## 2021-11-30 ENCOUNTER — Other Ambulatory Visit: Payer: Self-pay

## 2021-11-30 VITALS — BP 140/80 | HR 62 | Wt 137.8 lb

## 2021-11-30 DIAGNOSIS — I493 Ventricular premature depolarization: Secondary | ICD-10-CM | POA: Diagnosis not present

## 2021-11-30 DIAGNOSIS — J8489 Other specified interstitial pulmonary diseases: Secondary | ICD-10-CM | POA: Diagnosis not present

## 2021-11-30 DIAGNOSIS — I4891 Unspecified atrial fibrillation: Secondary | ICD-10-CM

## 2021-11-30 DIAGNOSIS — Z952 Presence of prosthetic heart valve: Secondary | ICD-10-CM

## 2021-11-30 DIAGNOSIS — I5032 Chronic diastolic (congestive) heart failure: Secondary | ICD-10-CM

## 2021-11-30 DIAGNOSIS — D509 Iron deficiency anemia, unspecified: Secondary | ICD-10-CM | POA: Insufficient documentation

## 2021-11-30 DIAGNOSIS — N1831 Chronic kidney disease, stage 3a: Secondary | ICD-10-CM | POA: Insufficient documentation

## 2021-11-30 DIAGNOSIS — I130 Hypertensive heart and chronic kidney disease with heart failure and stage 1 through stage 4 chronic kidney disease, or unspecified chronic kidney disease: Secondary | ICD-10-CM | POA: Diagnosis not present

## 2021-11-30 DIAGNOSIS — J9610 Chronic respiratory failure, unspecified whether with hypoxia or hypercapnia: Secondary | ICD-10-CM | POA: Diagnosis not present

## 2021-11-30 DIAGNOSIS — M7989 Other specified soft tissue disorders: Secondary | ICD-10-CM | POA: Insufficient documentation

## 2021-11-30 DIAGNOSIS — J432 Centrilobular emphysema: Secondary | ICD-10-CM | POA: Insufficient documentation

## 2021-11-30 DIAGNOSIS — I2510 Atherosclerotic heart disease of native coronary artery without angina pectoris: Secondary | ICD-10-CM | POA: Diagnosis not present

## 2021-11-30 DIAGNOSIS — I4821 Permanent atrial fibrillation: Secondary | ICD-10-CM | POA: Insufficient documentation

## 2021-11-30 DIAGNOSIS — Z8616 Personal history of COVID-19: Secondary | ICD-10-CM | POA: Insufficient documentation

## 2021-11-30 DIAGNOSIS — E871 Hypo-osmolality and hyponatremia: Secondary | ICD-10-CM | POA: Diagnosis not present

## 2021-11-30 DIAGNOSIS — Z7901 Long term (current) use of anticoagulants: Secondary | ICD-10-CM | POA: Diagnosis not present

## 2021-11-30 DIAGNOSIS — I5042 Chronic combined systolic (congestive) and diastolic (congestive) heart failure: Secondary | ICD-10-CM | POA: Insufficient documentation

## 2021-11-30 DIAGNOSIS — M310 Hypersensitivity angiitis: Secondary | ICD-10-CM | POA: Diagnosis not present

## 2021-11-30 DIAGNOSIS — Z951 Presence of aortocoronary bypass graft: Secondary | ICD-10-CM | POA: Insufficient documentation

## 2021-11-30 DIAGNOSIS — Z87891 Personal history of nicotine dependence: Secondary | ICD-10-CM | POA: Diagnosis not present

## 2021-11-30 DIAGNOSIS — I272 Pulmonary hypertension, unspecified: Secondary | ICD-10-CM | POA: Diagnosis not present

## 2021-11-30 DIAGNOSIS — R609 Edema, unspecified: Secondary | ICD-10-CM | POA: Diagnosis not present

## 2021-11-30 DIAGNOSIS — Z09 Encounter for follow-up examination after completed treatment for conditions other than malignant neoplasm: Secondary | ICD-10-CM | POA: Insufficient documentation

## 2021-11-30 DIAGNOSIS — K7460 Unspecified cirrhosis of liver: Secondary | ICD-10-CM | POA: Insufficient documentation

## 2021-11-30 DIAGNOSIS — I350 Nonrheumatic aortic (valve) stenosis: Secondary | ICD-10-CM | POA: Diagnosis not present

## 2021-11-30 DIAGNOSIS — Z79899 Other long term (current) drug therapy: Secondary | ICD-10-CM | POA: Diagnosis not present

## 2021-11-30 LAB — PROTIME-INR
INR: 1.8 — ABNORMAL HIGH (ref 0.8–1.2)
Prothrombin Time: 20.9 seconds — ABNORMAL HIGH (ref 11.4–15.2)

## 2021-11-30 LAB — CBC
HCT: 35.9 % — ABNORMAL LOW (ref 39.0–52.0)
Hemoglobin: 11.8 g/dL — ABNORMAL LOW (ref 13.0–17.0)
MCH: 26.6 pg (ref 26.0–34.0)
MCHC: 32.9 g/dL (ref 30.0–36.0)
MCV: 81 fL (ref 80.0–100.0)
Platelets: 207 10*3/uL (ref 150–400)
RBC: 4.43 MIL/uL (ref 4.22–5.81)
RDW: 25.1 % — ABNORMAL HIGH (ref 11.5–15.5)
WBC: 4.5 10*3/uL (ref 4.0–10.5)
nRBC: 0 % (ref 0.0–0.2)

## 2021-11-30 LAB — BASIC METABOLIC PANEL
Anion gap: 12 (ref 5–15)
BUN: 30 mg/dL — ABNORMAL HIGH (ref 8–23)
CO2: 28 mmol/L (ref 22–32)
Calcium: 9.3 mg/dL (ref 8.9–10.3)
Chloride: 84 mmol/L — ABNORMAL LOW (ref 98–111)
Creatinine, Ser: 1.73 mg/dL — ABNORMAL HIGH (ref 0.61–1.24)
GFR, Estimated: 41 mL/min — ABNORMAL LOW (ref >60)
Glucose, Bld: 97 mg/dL (ref 70–99)
Potassium: 3.4 mmol/L — ABNORMAL LOW (ref 3.5–5.1)
Sodium: 124 mmol/L — ABNORMAL LOW (ref 135–145)

## 2021-11-30 MED ORDER — MIDODRINE HCL 5 MG PO TABS: 15.0000 mg | ORAL_TABLET | Freq: Three times a day (TID) | ORAL | 3 refills | Status: DC

## 2021-11-30 NOTE — Progress Notes (Signed)
Advanced Heart Failure Clinic Note   Date:  11/30/2021   PCP:  [name], PA-C  Pulmonology: Dr. Lamonte Sakai Cardiologist:  Dr. Bettina Gavia  HF Cardiologist: Dr. 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 Also has COPD (quit smoking in 2002 - 1 ppd x 25 years), HL, and HTN.  In 8/18 admitted for acute gallstone pancreatitis. Underwent lap cholecystectomy on 07/29/2017. On 8/6 he developed hypotension and was taken back for open laparotomy due to bleeding from an unclear omental source. He was given fluids and 8 units PRBC perioperatively. He says his breathing has been bad since that time.   Seen at [facility] in 2018 had hi-res CT, PFTs and VQ (low prob). Felt to have Golds I COPD with asthma overlap and early pulmonary fibrosis with PAH. He was also seen by Cardiology. Echo 8/18 as below showed normal LV function with septal flattening and RVSP 65-23mHG. 14-day event monitor showed 10-beat run NSVT. Felt to have chronic AF and PAH. No further testing ordered.  Underwent RHC on 01/18/18 with severe PAH and cor pulmonale. Macitentan and Adcirca. Subsequently started on selexipag but dose limited by cramping now on 400/200  Admitted 5/19 with symptomatic hypotension and weight gain. He had a repeat RHC and echo with bubble study (results below). He was started on midodrine for BP support. Diuresed 25 lbs with IV lasix, then transitioned to torsemide 60 mg daily. Abdominal UKoreashowed cirrhosis with no evidence of portal HTN. Pulm consulted for possible ILD on High-res chest CT.  [facility]. [facility]. On doxy for 5 weeks.   Seen 09/12/21 with NYHA IIIb-IV symptoms. ReDs 38%. Given home IV lasix with [facility].    Admitted from  cath lab 09/29/21 with elevated filling pressures.  Hospitalization c/b hyponatremia requiring tolvaptan. Weight 143 lbs.   Had post hospital HF on 10/21 and still w/ mild fluid overload and FWilder Gladewas added. Labs showed hyponatremia w/ Na 121. Returned to clinic 11/03/21 feeling poorly and was direct admitted with a/c HF and hyponatremia, ReDs 41%. Sodium up to 129 at discharge, weight 136 lbs. [facility]  Today he returns for follow-up. He is feeling pretty good. Still SOB with mild exertion. Swelling improved but still with some edema in his thighs. No CP, orthopnea or PND. No bleeding with warfarin. Taking metolazone every 3rd day. BP improved on midodrine. SBP 110-120 range  Cardiac Studies: - RHC 10/22 Findings:   RA = 12 RV = 71/9 PA = 79/20 (38) PCW = 21 (v = 30) Fick cardiac output/index = 7.0/3.14 PVR = 3.1 WU FA sat = 99% PA sat = 70%, 74% PAPi = 4.9    Assessment: 1. Moderate mixed PAH with mild to moderate volume overload   Echo (4/22): EF 50-55%, RV ok, severe RVSP 61.6 mmHg, severe biatrial enlargement, moderate TR, stable AVR, AV mean gradient 11 mmHg  - Echo (3/21): EF 45-50% AVR ok. Septal flattening RV dilated mild HK. Moderate TR RVSP 80 severe bilateral enlargement.   - RHC 05/14/18 showed Mild/Moderate PAH in setting of high output with no evidence of intracardiac shunting. Hemodynamics as below.    - High-Res Chest CT 05/16/18 1. Slightly irregular  0.9 cm peripheral right middle lobe solid pulmonary nodule. 3 month follow up recommended.  2. Dependent basilar predominant patchy subpleural reticulation and ground-glass attenuation with the suggestion of minimal associated traction bronchiolectasis. No frank honeycombing. Findings may indicate an interstitial lung disease such as early usual interstitial pneumonia (UIP) or fibrotic phase nonspecific interstitial pneumonia (NSIP). Suggest a follow-up high-resolution chest  CT study in 6-12 months to assess temporal pattern stability, as clinically warranted. 3. Mild cardiomegaly. Minimal interlobular septal thickening and trace dependent bilateral pleural effusions.  4. Small to moderate volume perihepatic ascites. 5. Ectatic 4.3 cm ascending thoracic aorta. Recommend annual imaging followup by CTA or MRA.  6. Left main and 3 vessel coronary atherosclerosis.   - US Abdomen RUQ 05/16/18 - s/p cholecystectomy. + hepatic cirrhosis. Mild ascites.    - Liver Doppler 05/16/18 - No hepatic, splenic, or portal venous thrombosis or occlusion. Mild ascites.   - Echo with bubble study 05/16/18 LVEF 55-60% with "late bubbles" , Mechanical AVR stable with trivial perivalvular regurg, Mild MR, Severe LAE, Severe RV dilation and reduced function. No PFO. Mod TR, PA peak pressure 68 mm Hg   - RHC 05/14/18 RA = 18 RV = 71/17 PA = 69/24 (39) PCW = 20 Fick cardiac output/index = 7.0/3.7 Thermo CO/CI = 7.2/3.8 PVR = 2.6 WU Ao sat = 97% PA sat =  73%, 73% SVC sat = 73%  RA sat = 72%  - RHC 01/18/18 RA = 23 RV = 84/21 PA = 81/38 (53) PCW = 22 Fick cardiac output/index = 3.0/1.6 PVR = 10.4 WU FA sat = 98% on 2L  (checked on RA = 93%) PA sat = 60%, 61% SVC sat = 62%  -14 Day Patch 10/01/2017-10/15/2017: -1 run of Ventricular Tachycardia occurred lasting 10 beats with a max rate of 240 bpm (avg 161 bpm). -Atrial Flutter occurred continuously (100% burden), ranging from 55-132 bpm (avg of 87 bpm).   - NM Ventilation and Perfusion Lung Scan 10/31/2017:  Low probability of pulmonary embolism, using the PIOPED criteria.  - PFTs 07/02/2017:       FVC     86% FEV1   85% FEV1/FVC  73% DLCO  8.2, 32%   - 6MW -  1400 feet; 97% to 85% (recovered with resting)                                    CT Chest 07/02/2017: 1. Stable right middle lobe pulmonary nodule measuring up to 1 cm. 2. Minimal subpleural reticulation without evidence of honeycombing to  suggest  pulmonary fibrosis. Stable centrilobular emphysema. 3. Mediastinal lymphadenopathy, increased from prior. 4. Calcified right hilar lymph nodes and multiple calcified granulomas  throughout the right lung consistent with prior granulomatous disease. 5. Ascending thoracic aortic aneurysm measuring up to 4.5 cm, unchanged. 6. Dilatation of the main pulmonary artery suggestive of pulmonary  hypertension. 7. Cardiomegaly and dense atherosclerotic coronary artery calcifications.   Review of systems complete and found to be negative unless listed in HPI.   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 In light of concurrent left nephrolithiasis, will check CT renal colic next year instead of renal US.    Anticoagulated on Coumadin  01/04/2018   Anxiety 05/10/2016   Last Assessment & Plan:  Doing well off of zoloft.   Asthma    Asymptomatic microscopic hematuria 06/20/2017   Last Assessment & Plan:  Had hematuria workup in [facility] in 2016 which negative CT and cystoscopy. UA with 2+ blood last visit - we discussed recommendation for repeat workup at 5 years or if degree of hematuria progresses.    Atrial fibrillation ([place]) 03/29/2017   Atrial flutter ([place]) 03/29/2017   Backache 12/14/2015   Last Assessment & Plan:  Pain management referral for further evaluation.   Bilateral pleural effusion 08/09/2017   CHF (congestive heart failure) (HCC)    Chronic allergic rhinitis 04/28/2016   Last Assessment & Plan:  Continue astelin   Chronic anticoagulation 03/29/2017   Chronic atrial fibrillation ([place]) 07/23/2015   Last Assessment & Plan:  Coumadin and metoprolol, cardiology referral to establish care.   Chronic midline back pain 12/14/2015   Last Assessment & Plan:  Pain management referral for further evaluation.   Chronic  obstructive lung disease ([place]) 06/12/2016   With hypoxia   Chronic prostatitis 07/23/2015   Last Assessment & Plan:  Has largely resolved since stopping bike riding. Recommend annual DRE AND PSA - will see back 12/2015 for annual screening, given 1st degree fhx. To call office for recurrent prostatitis symptoms.    Chronic respiratory failure with hypoxia ([place]) 07/29/2019   Cirrhosis of liver not due to alcohol ([place]) 07/01/2019   COPD (chronic obstructive pulmonary disease) ([place]) 06/12/2016   Coronary arteriosclerosis in native artery 03/29/2017   Coronary artery disease involving native coronary artery of native heart with angina pectoris ([place]) 03/29/2017   Cough 10/17/2016   Last Assessment & Plan:  Discussed typical course for acute viral illness. If symptoms worsen or fail to improve by 7-10d, delayed ATBs, fluids, rest, NSAIDs/APAP prn. Seek care if not improving. Needs earlier INR check due to ATBs.   Dyspnea 02/01/2016   Last Assessment & Plan:  Overall improving, eval by pulm, plan for CT, neg stress test with cardiology. Recent switch to carvedilol due to side effects.   Encounter for therapeutic drug monitoring 01/06/2019   Enterococcal bacteremia    Epidermoid cyst of skin 08/24/2017   Essential hypertension 12/14/2015   Last Assessment & Plan:  Hypertension control: controlled  Medications: compliant Medication Management: as noted in orders Home blood pressure monitoring recommended additionally as needed for symptoms  The patient's care plan was reviewed and updated. Instructions and counseling were provided regarding patient goals and barriers. He was counseled to adopt a healthy lifestyle. Educational resources and self-management tools have been provided as charted in [facility] list.    H/O maze procedure 03/29/2017   [name] 03/29/2017   Overview:  2011   History of coronary artery bypass graft 03/29/2017   Hx of CABG 03/29/2017   Hyperlipidemia 03/29/2017   Hypersensitivity  angiitis ([place]) 10/01/2017   Hypertensive heart disease 03/29/2017   Hypertensive heart disease with heart failure ([place]) 03/29/2017   Hypertensive heart failure ([place]) 03/29/2017   Hypokalemia due to excessive renal loss of potassium 02/18/2018   Hypotension, chronic 06/06/2018   International normalized ratio (INR) raised 07/26/2017   Kidney stone 07/23/2015   Kidney stones 07/23/2015   Overview:  x 3  Last Assessment & Plan:  By [place] has left nephrolithiasis, but not visible by KUB. Will check CT renal colic next year to assess both stone burden as well as to surveil AML.  Left ureteral stone 01/23/2018   Leukocytoclastic vasculitis ([place]) 10/01/2017   Localized edema 01/04/2018   Long term (current) use of anticoagulants 03/29/2017   Lumbar radicular pain 01/19/2016   Lumbar radiculopathy 01/19/2016   Maculopapular rash 09/03/2017   Microscopic hematuria 06/20/2017   Last Assessment & Plan:  Had hematuria workup in [facility] in [facility] which negative CT and cystoscopy. UA with 2+ blood last visit - we discussed recommendation for repeat workup at 5 years or if degree of hematuria progresses.    Multiple nodules of lung 06/12/2016   Nasal discharge 02/25/2016   Last Assessment & Plan:  Trial zyrtec and flonase   Nephrolithiasis 07/23/2015   Overview:  x 3  Last Assessment & Plan:  By [place] has left nephrolithiasis, but not visible by KUB. Will check CT renal colic next year to assess both stone burden as well as to surveil AML.   Overview:  x 3  Last Assessment & Plan:  Has 52m nonobstructing LUP stone - not visible by KUB.  Will check renal [place]/2019 - he will contact office sooner if symptomatic.    Non-sustained ventricular tachycardia 03/29/2017   Nonsustained ventricular tachycardia 03/29/2017   Other hyperlipidemia 03/29/2017   Palpitations 10/01/2017   Pleural effusion, bilateral 08/09/2017   Pneumonia due to COVID-19 virus 02/07/2021   Post-nasal drainage 02/25/2016   Last Assessment & Plan:  Trial zyrtec and flonase    Prostate cancer screening 06/20/2017   Last Assessment & Plan:  Recommend continued annual CaP screening until within 10 years of life expectancy. Given good health and fhx of longevity, would anticipate CaP screening to continue until age 74  PSA today and again in one year on day of visit.   Pulmonary arterial hypertension ([place] 02/20/2018   Pulmonary edema    Pulmonary hypertension ([place] 08/09/2017   Pulmonary nodules 06/12/2016   S/P AVR 03/20/2016   S/P AVR (aortic valve replacement) 03/20/2016   Strain of deltoid muscle, initial encounter    Supratherapeutic INR 07/26/2017   Syncope and collapse 02/01/2016   Typical atrial flutter ([place] 02/01/2016   Past Surgical History:  Procedure Laterality Date   CHOLECYSTECTOMY     CORONARY ARTERY BYPASS GRAFT     EXPLORATORY LAPAROTOMY  07/30/2017   FOOT SURGERY     FRACTURE SURGERY Right    wrist and forearm   HERNIA REPAIR     MECHANICAL AORTIC VALVE REPLACEMENT     NASAL SINUS SURGERY     RIGHT HEART CATH N/A 01/18/2018   Procedure: RIGHT HEART CATH;  Surgeon: BJolaine Artist MD;  [facility];  Service: Cardiovascular;  Laterality: N/A;   RIGHT HEART CATH N/A 05/14/2018   Procedure: RIGHT HEART CATH;  Surgeon: BJolaine Artist MD;  [facility];  Service: Cardiovascular;  Laterality: N/A;   RIGHT HEART CATH N/A 09/29/2021   Procedure: RIGHT HEART CATH;  Surgeon: BJolaine Artist MD;  [facility];  Service: Cardiovascular;  Laterality: N/A;   TEE WITHOUT CARDIOVERSION N/A 05/21/2018   Procedure: TRANSESOPHAGEAL ECHOCARDIOGRAM (TEE);  Surgeon: BJolaine Artist MD;  [facility];  Service: Cardiovascular;  Laterality: N/A;   UPPER GASTROINTESTINAL ENDOSCOPY  07/12/2017   Patchy areas of mucosal inflammation noted in the antrum with edema,erthema and ulcerations. Bx. Chronicfocally active gastritis.   VASECTOMY     Current Medications: Current Meds  Medication Sig    acetaminophen (TYLENOL) 500 MG tablet Take 500-1,000 mg by mouth  every 6 (six) hours as needed (pain.).   albuterol (VENTOLIN HFA) 108 (90 Base) MCG/ACT inhaler Inhale 2 puffs into the lungs every 6 (six) hours as needed for wheezing or shortness of breath.   ammonium lactate (LAC-HYDRIN) 12 % lotion Apply 1 application topically as needed for dry skin.   Ascorbic Acid (VITAMIN C GUMMIE PO) Take 2 tablets by mouth daily at 12 noon.   aspirin EC 81 MG tablet Take 81 mg by mouth in the morning.   Cholecalciferol (VITAMIN D3) 50 MCG (2000 UT) TABS Take 2,000 Units by mouth daily at 12 noon.   Coenzyme Q10-Vitamin E (QUNOL ULTRA COQ10 PO) Take 1 capsule by mouth in the morning.   dapagliflozin propanediol (FARXIGA) 10 MG TABS tablet Take 1 tablet (10 mg total) by mouth daily before breakfast.   ferrous sulfate 325 (65 FE) MG EC tablet Take 325 mg by mouth every other day.   KRILL OIL PO Take 800 mg by mouth every other day. In the morning   Magnesium 200 MG CHEW Chew 400 mg by mouth daily at 12 noon.   metolazone (ZAROXOLYN) 2.5 MG tablet Take 1 tablet (2.5 mg total) by mouth as needed (for fluid).   midodrine (PROAMATINE) 5 MG tablet Take 3 tablets (15 mg total) by mouth 3 (three) times daily with meals.   OPSUMIT 10 MG tablet TAKE 1 TABLET DAILY   potassium chloride (KLOR-CON) 10 MEQ tablet Take 4 tablets (40 mEq total) by mouth 2 (two) times daily. Please dispense 10 meq tablets (take 40 meq bid)   RABEprazole (ACIPHEX) 20 MG tablet Take 1 tablet (20 mg total) by mouth daily.   rosuvastatin (CRESTOR) 5 MG tablet TAKE 1 TABLET(5 MG) BY MOUTH DAILY   spironolactone (ALDACTONE) 25 MG tablet Take 1 tablet (25 mg total) by mouth daily.   tadalafil, PAH, (ADCIRCA) 20 MG tablet Take 40 mg by mouth in the morning.   TART CHERRY PO Take 0.5 mLs by mouth daily in the afternoon.   torsemide (DEMADEX) 20 MG tablet Take 2 tablets (40 mg total) by mouth 2 (two) times daily.   Turmeric 500 MG TABS Take 1  capsule by mouth daily in the afternoon. With Ginger 50 mg   UPTRAVI 400 MCG TABS TAKE 1 TABLET (400 MCG) EVERY MORNING.   vitamin B-12 (CYANOCOBALAMIN) 1000 MCG tablet Take 1,000 mcg by mouth daily at 12 noon.   warfarin (COUMADIN) 5 MG tablet TAKE 1/2 DAILY DAILY AS DIRECTED BY COUMADIN CLINIC (Patient taking differently: TAKE 1/2 tab TTS and 1 tab MWF  AS DIRECTED BY COUMADIN CLINIC)   Zinc 50 MG TABS Take 50 mg by mouth every other day. In the afternoon   [DISCONTINUED] IRON CR PO Take 1 tablet by mouth daily in the afternoon.    Allergies:   Pantoprazole sodium, Valium [diazepam], Tape, and Wound dressing adhesive   Social History   Socioeconomic History   Marital status: Single    Spouse name: Not on file   Number of children: Not on file   Years of education: Not on file   Highest education level: Not on file  Occupational History   Not on file  Tobacco Use   Smoking status: Former    Packs/day: 2.00    Years: 34.00    Pack years: 68.00    Types: Cigarettes    Quit date: 07/16/2000    Years since quitting: 21.3   Smokeless tobacco: Never  Vaping Use   Vaping Use:  Never used  Substance and Sexual Activity   Alcohol use: Not Currently   Drug use: No   Sexual activity: Not on file  Other Topics Concern   Not on file  Social History Narrative   Not on file   Social Determinants of Health   Financial Resource Strain: Low Risk    Difficulty of Paying Living Expenses: Not very hard  Food Insecurity: No Food Insecurity   Worried About Running Out of Food in the Last Year: Never true   Ran Out of Food in the Last Year: Never true  Transportation Needs: No Transportation Needs   Lack of Transportation (Medical): No   Lack of Transportation (Non-Medical): No  Physical Activity: Not on file  Stress: Not on file  Social Connections: Not on file    Family History: The patient's family history includes Arthritis in his mother; Asthma in his mother; Heart attack in his  father; Hypertension in his father; Stroke in his paternal grandmother. There is no history of Colon cancer.  Recent Labs: 08/16/2021: Pro B Natriuretic peptide (BNP) 601.0 08/17/2021: TSH 2.71 09/30/2021: B Natriuretic Peptide 840.7 11/06/2021: ALT 18 11/15/2021: BUN 23; Creatinine, Ser 1.98; Hemoglobin 11.2; Magnesium 2.7; Platelets 190; Potassium 4.5; Sodium 130   Recent Lipid Panel    Component Value Date/Time   CHOL 133 10/12/2020 1104   TRIG 111 10/12/2020 1104   HDL 44 10/12/2020 1104   CHOLHDL 3.0 10/12/2020 1104   VLDL 11 10/21/2018 1028   LDLCALC 70 10/12/2020 1104   BP 140/80   Pulse 62   Wt 62.5 kg (137 lb 12.8 oz)   SpO2 97%   BMI 20.35 kg/m     Wt Readings from Last 3 Encounters:  11/30/21 62.5 kg (137 lb 12.8 oz)  11/15/21 64.9 kg (143 lb)  11/09/21 61.7 kg (136 lb 1.6 oz)   PHYSICAL EXAM: General:  Weak appearing. No resp difficulty HEENT: normal Neck: supple. JVP 9-10 with prominent v- waves Carotids 2+ bilat; no bruits. No lymphadenopathy or thryomegaly appreciated. Cor: PMI nondisplaced. Irregular rate & rhythm. Mechanical s2 2/6 AS Lungs: decreased throughout Abdomen: soft, nontender, nondistended. No hepatosplenomegaly. No bruits or masses. Good bowel sounds. Extremities: no cyanosis, clubbing, rash, tr edema Neuro: alert & orientedx3, cranial nerves grossly intact. moves all 4 extremities w/o difficulty. Affect pleasant  Assessment/Plan  1. Chronic Combined Systolic and Diastolic HF - Admitted 60/73/71 for IV diuresis after moderately elevated filling pressures on RHC.  - Echo 10/22 EF 45-50% RV low normal  - Readmitted w/ volume overload 11/22 after running out of diuretics x 4 days, c/b AKI and hyponatremia - Stable NYHA III, volume looks good today - Continue Farxiga 10 mg daily. - Continue Torsemide 40 mg bid. Taking metolazone every 3rd day - Continue spiro 25 mg daily.  - On midodrine for BP support - off ? blocker due to h/o  bradycardia/pauses. - no ARB/ARNi w/ CKD.  - Labs today   2. H/o Hyponatremia - Hypervolemic hyponatremia, - Free water restrict.  - Labs today.   3. CKD, Stage IIIa  - Baseline SCr 1.5-1.8 - Recent AKI with SCr 2.12 on admit.  - Suspect cardiorenal. - Labs today   4. Pulmonary hypertension with cor pulmonale/RV failure - WHO Group I & II - Auto-immune serologies negative (checked twice). - ? Component of HHT/shunt/AVMs with late bubbles on bubble study.  - Echo with bubble study (05/16/18): EF 55-60% with "late bubbles" , Mechanical AVR stable with trivial perivalvular regurg,  Mild MR, Severe LAE, Severe RV dilation and reduced function. No PFO. Mod TR, PA peak pressure 68 mm Hg - Echo (4/22): EF 50-55%, RV ok, severe RVSP 61.6 mmHg, severe biatrial enlargement, moderate TR, stable AVR, AV mean gradient 11 mmHg - Echo 10/22 EF 45-50 RV low normal  - Ab u/s with cirrhosis but no evidence of portal HTN. - On triple therapy with Macitentan, Claud Kelp. Uptravi dose at 400/200 limited by cramping.  Numbers not c/w need for IV therapies - Doing well with midodrine support   5. Chronic Respiratory Failure - [place] CT 05/16/18 with "dependent basilar predominant patchy subpleural reticulation and ground-glass attenuation with the suggestion of minimal associated traction bronchiolectasis. No frank honeycombing. Findings may indicate an interstitial lung disease such as early usual interstitial pneumonia (UIP) or fibrotic phase nonspecific interstitial pneumonia (NSIP)." - Followed by Dr. Lamonte Sakai.   6. Chronic AFL/Pauses - Rate controlled. Now off metoprolol.  - Multiple pauses 2-3 sec during recent admit 10/22. - Keep off beta blocker.  - Continue coumadin with mechanical AVR.  - INR followed [facility] in [place].   7. Frequent PVCs - This may be contributing to HF. - Repeat echo stable.    8. CAD - s/p CABG 07/2017 with Dr Roderic Palau in [place].  - No s/s ischemia -  Continue Crestor 5 mg daily. - Lipids followed by Dr. Bettina Gavia.   9. S/p mechanical AVR - Stable on Echo 4/22. - On Coumadin.  - Aware of need for SBE prophylaxis.   10. Leukocytoclastic vasculitis - F/u with Rheumatology.  - No changes.   11. Cirrhosis - US Abdomen RUQ 05/16/18 - s/p cholecystectomy. + hepatic cirrhosis. Mild ascites.  - Liver Doppler 05/16/18 - No hepatic, splenic, or portal venous thrombosis or occlusion. Mild ascites. - Continue midodrine 15 mg bid.    12. Iron-deficiency Anemia - Recent Hgb 9.2 -> 11.3. - Rec'd feraheme 11/05/21.   Glori Bickers, MD  1:58 PM

## 2021-11-30 NOTE — Patient Instructions (Signed)
It was great to see you today! No medication changes are needed at this time.   Labs today We will only contact you if something comes back abnormal or we need to make some changes. Otherwise no news is good news!  Your physician recommends that you schedule a follow-up appointment in: 3-4 months Dr Haroldine Laws  Do the following things EVERYDAY: Weigh yourself in the morning before breakfast. Write it down and keep it in a log. Take your medicines as prescribed Eat low salt foods--Limit salt (sodium) to 2000 mg per day.  Stay as active as you can everyday Limit all fluids for the day to less than 2 liters  At the [facility], you and your health needs are our priority. As part of our continuing mission to provide you with exceptional heart care, we have created designated Provider Care Teams. These Care Teams include your primary Cardiologist (physician) and Advanced Practice Providers (APPs- Physician Assistants and Nurse Practitioners) who all work together to provide you with the care you need, when you need it.   You may see any of the following providers on your designated Care Team at your next follow up: Dr Glori Bickers Dr Haynes Kerns, NP Lyda Jester, [place] [facility] [place], [place] Audry Riles, PharmD   Please be sure to bring in all your medications bottles to every appointment.    If you have any questions or concerns before your next appointment please send [place] a message through [place] or call our office at [phone].    TO LEAVE A MESSAGE FOR THE NURSE SELECT OPTION 2, PLEASE LEAVE A MESSAGE INCLUDING: YOUR NAME DATE OF BIRTH CALL BACK NUMBER REASON FOR CALL**this is important as we prioritize the call backs  YOU WILL RECEIVE A CALL BACK THE SAME DAY AS LONG AS YOU CALL BEFORE 4:00 PM

## 2021-12-01 ENCOUNTER — Ambulatory Visit (INDEPENDENT_AMBULATORY_CARE_PROVIDER_SITE_OTHER): Payer: Medicare Other | Admitting: Podiatry

## 2021-12-01 ENCOUNTER — Encounter: Payer: Self-pay | Admitting: Podiatry

## 2021-12-01 DIAGNOSIS — D689 Coagulation defect, unspecified: Secondary | ICD-10-CM

## 2021-12-01 DIAGNOSIS — M216X9 Other acquired deformities of unspecified foot: Secondary | ICD-10-CM | POA: Diagnosis not present

## 2021-12-01 DIAGNOSIS — B351 Tinea unguium: Secondary | ICD-10-CM

## 2021-12-01 NOTE — Progress Notes (Signed)
  Subjective:  Patient ID: Lance Hicks, male    DOB: 1946-09-26,  MRN: [id number]  Chief Complaint  Patient presents with   Nail Problem    Trim nails    Foot Pain    Would like orthotics and I was never told how much they were and I did not go to physical therapy due to I know how to walk    75 y.o. male presents with the above complaint. History confirmed with patient. Complains of right ankle tightness used to have a boot to stretch it but no longer. Would like to discuss this.  PCP: [name], PA-C; last seen 10/11/21  Objective:  Physical Exam: warm, good capillary refill, nail exam dystrophic nails, no trophic changes or ulcerative lesions. DP pulses palpable, PT pulses palpable, and protective sensation intact Hammertoes bilat without POP. Decreased right ankle ROM.  Assessment:   1. Onychomycosis   2. Coagulation defect ([place])   3. Equinus deformity of foot    Plan:  Patient was evaluated and treated and all questions answered.  Onychomycosis and Coagulation Defect -Nails palliatively debrided secondary to coag defect  Procedure: Nail Debridement Type of Debridement: manual, sharp debridement. Instrumentation: Nail nipper, rotary burr. Number of Nails: 10  Equinus Right -Dispense Night splint.  Pes planus -Dispense OTC orthotics. Advised of non-coverage. ABN signed.  Return in about 3 months (around 03/01/2022).

## 2021-12-05 ENCOUNTER — Telehealth: Payer: Self-pay | Admitting: Orthopaedic Surgery

## 2021-12-05 NOTE — Telephone Encounter (Signed)
Pt called requesting a call back. Really was confused on what pt is asking. Something to do with a referral I believe. Pt has not been in office since 2021. Pt is asking for a call back from [facility]. Or [facility]. Pt phone number is [phone]

## 2021-12-05 NOTE — Telephone Encounter (Signed)
Lvm for pt to cb

## 2021-12-06 ENCOUNTER — Telehealth: Payer: Self-pay | Admitting: *Deleted

## 2021-12-06 ENCOUNTER — Telehealth: Payer: Self-pay | Admitting: Physician Assistant

## 2021-12-06 ENCOUNTER — Other Ambulatory Visit (HOSPITAL_COMMUNITY): Payer: Self-pay

## 2021-12-06 ENCOUNTER — Telehealth (HOSPITAL_COMMUNITY): Payer: Self-pay | Admitting: Pharmacist

## 2021-12-06 ENCOUNTER — Telehealth: Payer: Self-pay | Admitting: Orthopaedic Surgery

## 2021-12-06 NOTE — Telephone Encounter (Signed)
We havent seen pt in a year. He needs f/u with gil please

## 2021-12-06 NOTE — Telephone Encounter (Signed)
Patient Advocate Encounter   Received notification from [employer] that prior authorization for Opsumit is required.   PA submitted on CoverMyMeds Key [id number] Status is pending   Received notification from [employer] that prior authorization for tadalafil is required.   PA submitted on CoverMyMeds Key  [id number] Status is pending   Received notification from [employer] that prior authorization for Malvin Johns is required.   PA submitted on CoverMyMeds Key  [id number] Status is pending  Audry Riles, PharmD, BCPS, BCCP, CPP Heart Failure Clinic Pharmacist [phone]

## 2021-12-06 NOTE — Telephone Encounter (Signed)
Advanced Heart Failure Patient Advocate Encounter  Prior Authorization for tadalafil and Malvin Johns have been approved.    Effective dates: 12/25/21 through 12/24/22  CMM was unable to process Opsumit prior authorization for 2023 at this time.    Audry Riles, PharmD, BCPS, BCCP, CPP Heart Failure Clinic Pharmacist [phone]

## 2021-12-06 NOTE — Telephone Encounter (Signed)
Called and left a message for the patient stating that we had gotten a pair of size 11 inserts for the patient and patient can call and arrange for pick up. Lance Hicks

## 2021-12-06 NOTE — Telephone Encounter (Signed)
Patient returned call asked for a call back. Patient also asked if he can get an order to go to [facility]?  The number to contact patient is [phone]

## 2021-12-06 NOTE — Telephone Encounter (Signed)
Called patient left voicemail message to return call to schedule an appointment to see Lance Hicks. Patient have not been seen in a year.

## 2021-12-07 ENCOUNTER — Telehealth (HOSPITAL_COMMUNITY): Payer: Self-pay | Admitting: Pharmacy Technician

## 2021-12-07 NOTE — Telephone Encounter (Signed)
Advanced Heart Failure Patient Advocate Encounter  Patient called in and left a message  about the change in assistance for Uptravi and Opsumit. [employer] are changing their rules for Medicare participants. Starting next year they will require Medicare patients to spend 4% OOP before they will approve patients for assistance.  I have already placed the patient on the PAN and TAF PAH wait list. They are not open at this time. I also placed the patient on a website of [employer]'s suggestion for patient's can get more information regarding the upcoming changes. Right now all we can do is wait.  Called and left the patient a message regarding the above information.   Charlann Boxer, CPhT

## 2021-12-09 ENCOUNTER — Telehealth (HOSPITAL_COMMUNITY): Payer: Self-pay | Admitting: Surgery

## 2021-12-09 DIAGNOSIS — I5032 Chronic diastolic (congestive) heart failure: Secondary | ICD-10-CM

## 2021-12-09 DIAGNOSIS — E871 Hypo-osmolality and hyponatremia: Secondary | ICD-10-CM

## 2021-12-09 MED ORDER — MIDODRINE HCL 5 MG PO TABS: 15.0000 mg | ORAL_TABLET | Freq: Three times a day (TID) | ORAL | 6 refills | Status: DC

## 2021-12-09 MED ORDER — POTASSIUM CHLORIDE CRYS ER 20 MEQ PO TBCR: 60.0000 meq | EXTENDED_RELEASE_TABLET | Freq: Two times a day (BID) | ORAL | 6 refills | Status: DC

## 2021-12-09 NOTE — Telephone Encounter (Signed)
-----  Message from Jolaine Artist, MD sent at 12/09/2021 12:27 PM EST ----- Increase potassium to 60 bid (If he is currently taking 40 bid) recheck 2 weeks.

## 2021-12-09 NOTE — Telephone Encounter (Signed)
Patient called and results reviewed as well as recommendations per Dr. Haroldine Laws.  Medications changed in [facility], sent to his pharmacy of choice  and lab recheck scheduled for Jan 3rd, 2023.

## 2021-12-12 ENCOUNTER — Encounter (HOSPITAL_COMMUNITY): Payer: Self-pay

## 2021-12-12 ENCOUNTER — Ambulatory Visit (HOSPITAL_COMMUNITY): Admit: 2021-12-12 | Payer: Medicare Other | Admitting: Internal Medicine

## 2021-12-12 SURGERY — ESOPHAGOGASTRODUODENOSCOPY (EGD) WITH PROPOFOL
Anesthesia: Monitor Anesthesia Care

## 2021-12-14 ENCOUNTER — Encounter: Payer: Self-pay | Admitting: Physician Assistant

## 2021-12-14 ENCOUNTER — Other Ambulatory Visit: Payer: Self-pay

## 2021-12-14 ENCOUNTER — Ambulatory Visit (INDEPENDENT_AMBULATORY_CARE_PROVIDER_SITE_OTHER): Payer: Medicare Other | Admitting: Physician Assistant

## 2021-12-14 DIAGNOSIS — M25511 Pain in right shoulder: Secondary | ICD-10-CM | POA: Diagnosis not present

## 2021-12-14 DIAGNOSIS — I25119 Atherosclerotic heart disease of native coronary artery with unspecified angina pectoris: Secondary | ICD-10-CM | POA: Diagnosis not present

## 2021-12-14 DIAGNOSIS — M542 Cervicalgia: Secondary | ICD-10-CM

## 2021-12-14 MED ORDER — TIZANIDINE HCL 2 MG PO CAPS: 2.0000 mg | ORAL_CAPSULE | Freq: Three times a day (TID) | ORAL | 1 refills | Status: DC

## 2021-12-14 NOTE — Progress Notes (Signed)
Office Visit Note   Patient: Lance Hicks           Date of Birth: 04-20-1946           MRN: [id number] Visit Date: 12/14/2021              Requested by: Lance Pai, PA-C [place],  [place] [address] PCP: Lance Pai, PA-C   Assessment & Plan: Visit Diagnoses: No diagnosis found.  Plan: Per his request he is given a therapy prescription for the shoulder they will work on range of motion strengthening home exercise program and include modalities.  Also in regards to his neck they will include modalities range of motion home exercise program.  He will follow-up with [place] as needed.  Questions encouraged and answered  Follow-Up Instructions: Return if symptoms worsen or fail to improve.   Orders:  No orders of the defined types were placed in this encounter.  Meds ordered this encounter  Medications   tizanidine (ZANAFLEX) 2 MG capsule    Sig: Take 1 capsule (2 mg total) by mouth 3 (three) times daily.    Dispense:  30 capsule    Refill:  1      Procedures: No procedures performed   Clinical Data: No additional findings.   Subjective: Chief Complaint  Patient presents with   Neck - Pain, Follow-up   Right Shoulder - Pain, Follow-up    HPI Lance Hicks 75 year old male comes in today for right shoulder pain and neck pain.  We last saw him in November 2021.  Since since that time he has been hospitalized with COVID, pulmonary hypertension, acute on chronic kidney disease, respiratory failure, coronary artery disease and anemia.  He reports that he was unable to get a therapy for his shoulder and neck.  He is having increased pain after being in the hospital involving his right shoulder and his cervical spine.  The symptoms his occasional tingling in his hands but no radicular symptoms down either arm.  He is mostly here asking for a prescription to return to physical therapy for his neck and shoulder. At last visit his radiographs of  his right shoulder showed a slightly high riding humeral head.  Mild narrowing in the glenohumeral joint.  Postsurgical changes of distal clavicle and subacromial changes.  Cervical spine showed mild arthritic changes with endplate spurring at multiple cervical vertebral bodies.  He has had no known injury.  Review of Systems See HPI otherwise negative  Objective: Vital Signs: There were no vitals taken for this visit.  Physical Exam Constitutional:      Appearance: He is not ill-appearing or diaphoretic.  Cardiovascular:     Pulses: Normal pulses.  Pulmonary:     Effort: Pulmonary effort is normal.  Neurological:     Mental Status: He is alert and oriented to person, place, and time.  Psychiatric:        Mood and Affect: Mood normal.    Ortho Exam Bilateral shoulders he has 5-5 strength throughout against resistance positive impingement on the right shoulder.  Cervical spine decreased range of motion and tenderness medial borders of bilateral scapula.  Otherwise upper extremity strength 5 out of 5 full sensation bilateral hands.  Full motor bilateral hands. Specialty Comments:  No specialty comments available.  Imaging: No results found.   PMFS History: Patient Active Problem List   Diagnosis Date Noted   Acute on chronic diastolic congestive heart failure, NYHA class 3 (  [place]) 11/03/2021   Acute on chronic diastolic (congestive) heart failure ([place]) 09/29/2021   ILD (interstitial lung disease) ([place]) 08/04/2021   Fall 06/21/2021   History of COVID-19 04/05/2021   Asthma    Pneumonia due to COVID-19 virus 02/07/2021   Chronic respiratory failure with hypoxia ([place]) 07/29/2019   Cirrhosis of liver not due to alcohol ([place]) 07/01/2019   Anemia 07/01/2019   Hypotension, chronic 06/06/2018   Enterococcal bacteremia    Pulmonary edema    Strain of deltoid muscle, initial encounter    Pulmonary arterial hypertension ([place]) 02/20/2018   Hypokalemia due to excessive renal loss of  potassium 02/18/2018   Left ureteral stone 01/23/2018   Anticoagulated on Coumadin 01/04/2018   Localized edema 01/04/2018   Leukocytoclastic vasculitis ([place]) 10/01/2017   Palpitations 10/01/2017   Hypersensitivity angiitis ([place]) 10/01/2017   Maculopapular rash 09/03/2017   Epidermoid cyst of skin 08/24/2017   Bilateral pleural effusion 08/09/2017   Pleural effusion, bilateral 08/09/2017   Supratherapeutic INR 07/26/2017   International normalized ratio (INR) raised 07/26/2017   Microscopic hematuria 06/20/2017   Prostate cancer screening 06/20/2017   Asymptomatic microscopic hematuria 06/20/2017   Atrial flutter ([place]) 03/29/2017   Chronic anticoagulation 03/29/2017   Coronary artery disease involving native coronary artery of native heart with angina pectoris ([place]) 03/29/2017   H/O maze procedure 03/29/2017   History of coronary artery bypass graft 03/29/2017   Hypertensive heart disease with heart failure ([place]) 03/29/2017   Nonsustained ventricular tachycardia 03/29/2017   Hyperlipidemia 03/29/2017   Syncope 03/29/2017   Coronary arteriosclerosis in native artery 03/29/2017   Hypertensive heart failure ([place]) 03/29/2017   Long term (current) use of anticoagulants 03/29/2017   Paroxysmal atrial fibrillation ([place]) 03/29/2017   Other hyperlipidemia 03/29/2017   Non-sustained ventricular tachycardia 03/29/2017   Hypertensive heart disease 03/29/2017   Hx of CABG 03/29/2017   Atrial fibrillation ([place]) 03/29/2017   Cough 10/17/2016   Chronic obstructive lung disease ([place]) 06/12/2016   Pulmonary nodules 06/12/2016   Multiple nodules of lung 06/12/2016   COPD (chronic obstructive pulmonary disease) ([place]) 06/12/2016   Anxiety 05/10/2016   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   Allergic rhinitis 04/28/2016   [name] 03/20/2016   S/P AVR 03/20/2016   S/P AVR (aortic valve replacement) 03/20/2016   Nasal discharge 02/25/2016   Post-nasal  drainage 02/25/2016   Dyspnea 02/01/2016   Syncope and collapse 02/01/2016   Typical atrial flutter ([place]) 02/01/2016   Lumbar radicular pain 01/19/2016   Lumbar radiculopathy 01/19/2016   Backache 12/14/2015   Essential hypertension 12/14/2015   Chronic midline back pain 12/14/2015   Chronic prostatitis 07/23/2015   Nephrolithiasis 07/23/2015   Kidney stone 07/23/2015   Chronic atrial fibrillation ([place]) 07/23/2015   Kidney stones 07/23/2015   Past Medical History:  Diagnosis Date   Allergic rhinitis 04/28/2016   Last Assessment & Plan:  Continue astelin   Anemia 07/01/2019   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   Last Assessment & Plan:  Stable in size on annual imaging. In light of concurrent left nephrolithiasis, will check CT renal colic next year instead of renal US.    Anticoagulated on Coumadin 01/04/2018   Anxiety 05/10/2016   Last Assessment & Plan:  Doing well off of zoloft.   Asthma    Asymptomatic microscopic hematuria 06/20/2017   Last Assessment & Plan:  Had hematuria workup in [facility] in 2016 which negative CT and cystoscopy. UA with 2+ blood last visit - we discussed recommendation  for repeat workup at 5 years or if degree of hematuria progresses.    Atrial fibrillation ([place]) 03/29/2017   Atrial flutter ([place]) 03/29/2017   Backache 12/14/2015   Last Assessment & Plan:  Pain management referral for further evaluation.   Bilateral pleural effusion 08/09/2017   CHF (congestive heart failure) (HCC)    Chronic allergic rhinitis 04/28/2016   Last Assessment & Plan:  Continue astelin   Chronic anticoagulation 03/29/2017   Chronic atrial fibrillation ([place]) 07/23/2015   Last Assessment & Plan:  Coumadin and metoprolol, cardiology referral to establish care.   Chronic midline back pain 12/14/2015   Last Assessment & Plan:  Pain management referral for further evaluation.   Chronic obstructive lung disease ([place]) 06/12/2016   With hypoxia   Chronic prostatitis 07/23/2015   Last Assessment &  Plan:  Has largely resolved since stopping bike riding. Recommend annual DRE AND PSA - will see back 12/2015 for annual screening, given 1st degree fhx. To call office for recurrent prostatitis symptoms.    Chronic respiratory failure with hypoxia ([place]) 07/29/2019   Cirrhosis of liver not due to alcohol ([place]) 07/01/2019   COPD (chronic obstructive pulmonary disease) ([place]) 06/12/2016   Coronary arteriosclerosis in native artery 03/29/2017   Coronary artery disease involving native coronary artery of native heart with angina pectoris ([place]) 03/29/2017   Cough 10/17/2016   Last Assessment & Plan:  Discussed typical course for acute viral illness. If symptoms worsen or fail to improve by 7-10d, delayed ATBs, fluids, rest, NSAIDs/APAP prn. Seek care if not improving. Needs earlier INR check due to ATBs.   Dyspnea 02/01/2016   Last Assessment & Plan:  Overall improving, eval by pulm, plan for CT, neg stress test with cardiology. Recent switch to carvedilol due to side effects.   Encounter for therapeutic drug monitoring 01/06/2019   Enterococcal bacteremia    Epidermoid cyst of skin 08/24/2017   Essential hypertension 12/14/2015   Last Assessment & Plan:  Hypertension control: controlled  Medications: compliant Medication Management: as noted in orders Home blood pressure monitoring recommended additionally as needed for symptoms  The patient's care plan was reviewed and updated. Instructions and counseling were provided regarding patient goals and barriers. He was counseled to adopt a healthy lifestyle. Educational resources and self-management tools have been provided as charted in [facility] list.    H/O maze procedure 03/29/2017   [name] 03/29/2017   Overview:  2011   History of coronary artery bypass graft 03/29/2017   Hx of CABG 03/29/2017   Hyperlipidemia 03/29/2017   Hypersensitivity angiitis ([place]) 10/01/2017   Hypertensive heart disease 03/29/2017   Hypertensive heart disease with heart  failure ([place]) 03/29/2017   Hypertensive heart failure ([place]) 03/29/2017   Hypokalemia due to excessive renal loss of potassium 02/18/2018   Hypotension, chronic 06/06/2018   International normalized ratio (INR) raised 07/26/2017   Kidney stone 07/23/2015   Kidney stones 07/23/2015   Overview:  x 3  Last Assessment & Plan:  By [place] has left nephrolithiasis, but not visible by KUB. Will check CT renal colic next year to assess both stone burden as well as to surveil AML.    Left ureteral stone 01/23/2018   Leukocytoclastic vasculitis ([place]) 10/01/2017   Localized edema 01/04/2018   Long term (current) use of anticoagulants 03/29/2017   Lumbar radicular pain 01/19/2016   Lumbar radiculopathy 01/19/2016   Maculopapular rash 09/03/2017   Microscopic hematuria 06/20/2017   Last Assessment & Plan:  Had hematuria  workup in [facility] in [facility] which negative CT and cystoscopy. UA with 2+ blood last visit - we discussed recommendation for repeat workup at 5 years or if degree of hematuria progresses.    Multiple nodules of lung 06/12/2016   Nasal discharge 02/25/2016   Last Assessment & Plan:  Trial zyrtec and flonase   Nephrolithiasis 07/23/2015   Overview:  x 3  Last Assessment & Plan:  By [place] has left nephrolithiasis, but not visible by KUB. Will check CT renal colic next year to assess both stone burden as well as to surveil AML.   Overview:  x 3  Last Assessment & Plan:  Has 4m nonobstructing LUP stone - not visible by KUB.  Will check renal [place] - he will contact office sooner if symptomatic.    Non-sustained ventricular tachycardia 03/29/2017   Nonsustained ventricular tachycardia 03/29/2017   Other hyperlipidemia 03/29/2017   Palpitations 10/01/2017   Pleural effusion, bilateral 08/09/2017   Pneumonia due to COVID-19 virus 02/07/2021   Post-nasal drainage 02/25/2016   Last Assessment & Plan:  Trial zyrtec and flonase   Prostate cancer screening 06/20/2017   Last Assessment & Plan:  Recommend continued annual CaP screening until  within 10 years of life expectancy. Given good health and fhx of longevity, would anticipate CaP screening to continue until age 89  PSA today and again in one year on day of visit.   Pulmonary arterial hypertension ([place] 02/20/2018   Pulmonary edema    Pulmonary hypertension ([place] 08/09/2017   Pulmonary nodules 06/12/2016   S/P AVR 03/20/2016   S/P AVR (aortic valve replacement) 03/20/2016   Strain of deltoid muscle, initial encounter    Supratherapeutic INR 07/26/2017   Syncope and collapse 02/01/2016   Typical atrial flutter ([place] 02/01/2016    Family History  Problem Relation Age of Onset   Asthma Mother    Arthritis Mother    Heart attack Father    Hypertension Father    Stroke Paternal Grandmother    Colon cancer Neg Hx     Past Surgical History:  Procedure Laterality Date   CHOLECYSTECTOMY     CORONARY ARTERY BYPASS GRAFT     EXPLORATORY LAPAROTOMY  07/30/2017   FOOT SURGERY     FRACTURE SURGERY Right    wrist and forearm   HERNIA REPAIR     MECHANICAL AORTIC VALVE REPLACEMENT     NASAL SINUS SURGERY     RIGHT HEART CATH N/A 01/18/2018   Procedure: RIGHT HEART CATH;  Surgeon: BJolaine Artist MD;  [facility];  Service: Cardiovascular;  Laterality: N/A;   RIGHT HEART CATH N/A 05/14/2018   Procedure: RIGHT HEART CATH;  Surgeon: BJolaine Artist MD;  [facility];  Service: Cardiovascular;  Laterality: N/A;   RIGHT HEART CATH N/A 09/29/2021   Procedure: RIGHT HEART CATH;  Surgeon: BJolaine Artist MD;  [facility];  Service: Cardiovascular;  Laterality: N/A;   TEE WITHOUT CARDIOVERSION N/A 05/21/2018   Procedure: TRANSESOPHAGEAL ECHOCARDIOGRAM (TEE);  Surgeon: BJolaine Artist MD;  [facility];  Service: Cardiovascular;  Laterality: N/A;   UPPER GASTROINTESTINAL ENDOSCOPY  07/12/2017   Patchy areas of mucosal inflammation noted in the antrum with edema,erthema and ulcerations. Bx. Chronicfocally active  gastritis.   VASECTOMY     Social History   Occupational History   Not on file  Tobacco Use   Smoking status: Former    Packs/day: 2.00  Years: 34.00    Pack years: 68.00    Types: Cigarettes    Quit date: 07/16/2000    Years since quitting: 21.4   Smokeless tobacco: Never  Vaping Use   Vaping Use: Never used  Substance and Sexual Activity   Alcohol use: Not Currently   Drug use: No   Sexual activity: Not on file

## 2021-12-15 ENCOUNTER — Telehealth: Payer: Self-pay | Admitting: Physician Assistant

## 2021-12-15 ENCOUNTER — Other Ambulatory Visit: Payer: Self-pay | Admitting: Physician Assistant

## 2021-12-15 MED ORDER — TIZANIDINE HCL 2 MG PO TABS: 2.0000 mg | ORAL_TABLET | Freq: Three times a day (TID) | ORAL | 1 refills | Status: DC

## 2021-12-15 NOTE — Telephone Encounter (Signed)
Please advise

## 2021-12-15 NOTE — Telephone Encounter (Signed)
Pt called requesting PA Clark send in generic muscle relaxer medication that was sent in for pt. Pt states his insurance company wont pay for brand name. Please call pt about this matter and send new script to pharmacy on file. Pt phone number is [phone].

## 2021-12-20 ENCOUNTER — Telehealth (INDEPENDENT_AMBULATORY_CARE_PROVIDER_SITE_OTHER): Payer: Medicare Other | Admitting: Family

## 2021-12-20 ENCOUNTER — Telehealth: Payer: Self-pay

## 2021-12-20 ENCOUNTER — Encounter: Payer: Self-pay | Admitting: Family

## 2021-12-20 VITALS — BP 126/76 | HR 64 | Temp 100.0°F | Ht 69.0 in | Wt 143.0 lb

## 2021-12-20 DIAGNOSIS — R509 Fever, unspecified: Secondary | ICD-10-CM | POA: Diagnosis not present

## 2021-12-20 DIAGNOSIS — B349 Viral infection, unspecified: Secondary | ICD-10-CM

## 2021-12-20 MED ORDER — ONDANSETRON HCL 8 MG PO TABS: 8.0000 mg | ORAL_TABLET | Freq: Three times a day (TID) | ORAL | 0 refills | Status: DC | PRN

## 2021-12-20 NOTE — Progress Notes (Signed)
Lance Hicks is a 75 y.o. male with the following history as recorded in EpicCare:  Patient Active Problem List   Diagnosis Date Noted   Acute on chronic diastolic congestive heart failure, NYHA class 3 ([place]) 11/03/2021   Acute on chronic diastolic (congestive) heart failure ([place]) 09/29/2021   ILD (interstitial lung disease) ([place]) 08/04/2021   Fall 06/21/2021   History of COVID-19 04/05/2021   Asthma    Pneumonia due to COVID-19 virus 02/07/2021   Chronic respiratory failure with hypoxia ([place]) 07/29/2019   Cirrhosis of liver not due to alcohol ([place]) 07/01/2019   Anemia 07/01/2019   Hypotension, chronic 06/06/2018   Enterococcal bacteremia    Pulmonary edema    Strain of deltoid muscle, initial encounter    Pulmonary arterial hypertension ([place]) 02/20/2018   Hypokalemia due to excessive renal loss of potassium 02/18/2018   Left ureteral stone 01/23/2018   Anticoagulated on Coumadin 01/04/2018   Localized edema 01/04/2018   Leukocytoclastic vasculitis ([place]) 10/01/2017   Palpitations 10/01/2017   Hypersensitivity angiitis ([place]) 10/01/2017   Maculopapular rash 09/03/2017   Epidermoid cyst of skin 08/24/2017   Bilateral pleural effusion 08/09/2017   Pleural effusion, bilateral 08/09/2017   Supratherapeutic INR 07/26/2017   International normalized ratio (INR) raised 07/26/2017   Microscopic hematuria 06/20/2017   Prostate cancer screening 06/20/2017   Asymptomatic microscopic hematuria 06/20/2017   Atrial flutter ([place]) 03/29/2017   Chronic anticoagulation 03/29/2017   Coronary artery disease involving native coronary artery of native heart with angina pectoris ([place]) 03/29/2017   H/O maze procedure 03/29/2017   History of coronary artery bypass graft 03/29/2017   Hypertensive heart disease with heart failure ([place]) 03/29/2017   Nonsustained ventricular tachycardia 03/29/2017   Hyperlipidemia 03/29/2017   Syncope 03/29/2017   Coronary arteriosclerosis in native artery  03/29/2017   Hypertensive heart failure ([place]) 03/29/2017   Long term (current) use of anticoagulants 03/29/2017   Paroxysmal atrial fibrillation ([place]) 03/29/2017   Other hyperlipidemia 03/29/2017   Non-sustained ventricular tachycardia 03/29/2017   Hypertensive heart disease 03/29/2017   Hx of CABG 03/29/2017   Atrial fibrillation ([place]) 03/29/2017   Cough 10/17/2016   Chronic obstructive lung disease ([place]) 06/12/2016   Pulmonary nodules 06/12/2016   Multiple nodules of lung 06/12/2016   COPD (chronic obstructive pulmonary disease) ([place]) 06/12/2016   Anxiety 05/10/2016   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   Allergic rhinitis 04/28/2016   [name] 03/20/2016   S/P AVR 03/20/2016   S/P AVR (aortic valve replacement) 03/20/2016   Nasal discharge 02/25/2016   Post-nasal drainage 02/25/2016   Dyspnea 02/01/2016   Syncope and collapse 02/01/2016   Typical atrial flutter ([place]) 02/01/2016   Lumbar radicular pain 01/19/2016   Lumbar radiculopathy 01/19/2016   Backache 12/14/2015   Essential hypertension 12/14/2015   Chronic midline back pain 12/14/2015   Chronic prostatitis 07/23/2015   Nephrolithiasis 07/23/2015   Kidney stone 07/23/2015   Chronic atrial fibrillation ([place]) 07/23/2015   Kidney stones 07/23/2015    Current Outpatient Medications  Medication Sig Dispense Refill   acetaminophen (TYLENOL) 500 MG tablet Take 500-1,000 mg by mouth every 6 (six) hours as needed (pain.).     ammonium lactate (LAC-HYDRIN) 12 % lotion Apply 1 application topically as needed for dry skin.     Ascorbic Acid (VITAMIN C GUMMIE PO) Take 2 tablets by mouth daily at 12 noon.     aspirin EC 81 MG tablet Take 81 mg by mouth in the morning.  Cholecalciferol (VITAMIN D3) 50 MCG (2000 UT) TABS Take 2,000 Units by mouth daily at 12 noon.     Coenzyme Q10-Vitamin E (QUNOL ULTRA COQ10 PO) Take 1 capsule by mouth in the morning.     dapagliflozin propanediol (FARXIGA)  10 MG TABS tablet Take 1 tablet (10 mg total) by mouth daily before breakfast. 30 tablet 6   ferrous sulfate 325 (65 FE) MG EC tablet Take 325 mg by mouth every other day.     KRILL OIL PO Take 800 mg by mouth every other day. In the morning     Magnesium 200 MG CHEW Chew 400 mg by mouth daily at 12 noon.     metolazone (ZAROXOLYN) 2.5 MG tablet Take 1 tablet (2.5 mg total) by mouth as needed (for fluid). 20 tablet 3   midodrine (PROAMATINE) 5 MG tablet Take 3 tablets (15 mg total) by mouth 3 (three) times daily with meals. 810 tablet 6   ondansetron (ZOFRAN) 8 MG tablet Take 1 tablet (8 mg total) by mouth every 8 (eight) hours as needed for nausea or vomiting. 20 tablet 0   OPSUMIT 10 MG tablet TAKE 1 TABLET DAILY 30 tablet 11   potassium chloride SA (KLOR-CON M) 20 MEQ tablet Take 3 tablets (60 mEq total) by mouth 2 (two) times daily. 180 tablet 6   RABEprazole (ACIPHEX) 20 MG tablet Take 1 tablet (20 mg total) by mouth daily. 180 tablet 1   rosuvastatin (CRESTOR) 5 MG tablet TAKE 1 TABLET(5 MG) BY MOUTH DAILY 90 tablet 3   spironolactone (ALDACTONE) 25 MG tablet Take 1 tablet (25 mg total) by mouth daily. 30 tablet 3   tadalafil, PAH, (ADCIRCA) 20 MG tablet Take 40 mg by mouth in the morning.     TART CHERRY PO Take 0.5 mLs by mouth daily in the afternoon.     tiZANidine (ZANAFLEX) 2 MG tablet Take 1 tablet (2 mg total) by mouth 3 (three) times daily. 90 tablet 1   torsemide (DEMADEX) 20 MG tablet Take 2 tablets (40 mg total) by mouth 2 (two) times daily. 130 tablet 4   Turmeric 500 MG TABS Take 1 capsule by mouth daily in the afternoon. With Ginger 50 mg     UPTRAVI 400 MCG TABS TAKE 1 TABLET (400 MCG) EVERY MORNING. 30 tablet 11   warfarin (COUMADIN) 5 MG tablet TAKE 1/2 DAILY DAILY AS DIRECTED BY COUMADIN CLINIC (Patient taking differently: TAKE 1/2 tab TTS and 1 tab MWF  AS DIRECTED BY COUMADIN CLINIC) 30 tablet 5   Zinc 50 MG TABS Take 50 mg by mouth every other day. In the afternoon      albuterol (VENTOLIN HFA) 108 (90 Base) MCG/ACT inhaler Inhale 2 puffs into the lungs every 6 (six) hours as needed for wheezing or shortness of breath. (Patient not taking: Reported on 12/20/2021) 8 g 6   vitamin B-12 (CYANOCOBALAMIN) 1000 MCG tablet Take 1,000 mcg by mouth daily at 12 noon. (Patient not taking: Reported on 12/20/2021)     No current facility-administered medications for this visit.    Allergies: Pantoprazole sodium, Valium [diazepam], Tape, and Wound dressing adhesive  Past Medical History:  Diagnosis Date   Allergic rhinitis 04/28/2016   Last Assessment & Plan:  Continue astelin   Anemia 07/01/2019   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   Last Assessment & Plan:  Stable in size on annual imaging. In light of concurrent left nephrolithiasis, will check CT renal colic next year instead of renal  US.    Anticoagulated on Coumadin 01/04/2018   Anxiety 05/10/2016   Last Assessment & Plan:  Doing well off of zoloft.   Asthma    Asymptomatic microscopic hematuria 06/20/2017   Last Assessment & Plan:  Had hematuria workup in [facility] in 2016 which negative CT and cystoscopy. UA with 2+ blood last visit - we discussed recommendation for repeat workup at 5 years or if degree of hematuria progresses.    Atrial fibrillation ([place]) 03/29/2017   Atrial flutter ([place]) 03/29/2017   Backache 12/14/2015   Last Assessment & Plan:  Pain management referral for further evaluation.   Bilateral pleural effusion 08/09/2017   CHF (congestive heart failure) (HCC)    Chronic allergic rhinitis 04/28/2016   Last Assessment & Plan:  Continue astelin   Chronic anticoagulation 03/29/2017   Chronic atrial fibrillation ([place]) 07/23/2015   Last Assessment & Plan:  Coumadin and metoprolol, cardiology referral to establish care.   Chronic midline back pain 12/14/2015   Last Assessment & Plan:  Pain management referral for further evaluation.   Chronic obstructive lung disease ([place]) 06/12/2016   With hypoxia   Chronic  prostatitis 07/23/2015   Last Assessment & Plan:  Has largely resolved since stopping bike riding. Recommend annual DRE AND PSA - will see back 12/2015 for annual screening, given 1st degree fhx. To call office for recurrent prostatitis symptoms.    Chronic respiratory failure with hypoxia ([place]) 07/29/2019   Cirrhosis of liver not due to alcohol ([place]) 07/01/2019   COPD (chronic obstructive pulmonary disease) ([place]) 06/12/2016   Coronary arteriosclerosis in native artery 03/29/2017   Coronary artery disease involving native coronary artery of native heart with angina pectoris ([place]) 03/29/2017   Cough 10/17/2016   Last Assessment & Plan:  Discussed typical course for acute viral illness. If symptoms worsen or fail to improve by 7-10d, delayed ATBs, fluids, rest, NSAIDs/APAP prn. Seek care if not improving. Needs earlier INR check due to ATBs.   Dyspnea 02/01/2016   Last Assessment & Plan:  Overall improving, eval by pulm, plan for CT, neg stress test with cardiology. Recent switch to carvedilol due to side effects.   Encounter for therapeutic drug monitoring 01/06/2019   Enterococcal bacteremia    Epidermoid cyst of skin 08/24/2017   Essential hypertension 12/14/2015   Last Assessment & Plan:  Hypertension control: controlled  Medications: compliant Medication Management: as noted in orders Home blood pressure monitoring recommended additionally as needed for symptoms  The patient's care plan was reviewed and updated. Instructions and counseling were provided regarding patient goals and barriers. He was counseled to adopt a healthy lifestyle. Educational resources and self-management tools have been provided as charted in [facility] list.    H/O maze procedure 03/29/2017   [name] 03/29/2017   Overview:  2011   History of coronary artery bypass graft 03/29/2017   Hx of CABG 03/29/2017   Hyperlipidemia 03/29/2017   Hypersensitivity angiitis ([place]) 10/01/2017   Hypertensive heart disease 03/29/2017    Hypertensive heart disease with heart failure ([place]) 03/29/2017   Hypertensive heart failure ([place]) 03/29/2017   Hypokalemia due to excessive renal loss of potassium 02/18/2018   Hypotension, chronic 06/06/2018   International normalized ratio (INR) raised 07/26/2017   Kidney stone 07/23/2015   Kidney stones 07/23/2015   Overview:  x 3  Last Assessment & Plan:  By [place] has left nephrolithiasis, but not visible by KUB. Will check CT renal colic next year to assess both stone burden  as well as to surveil AML.    Left ureteral stone 01/23/2018   Leukocytoclastic vasculitis ([place]) 10/01/2017   Localized edema 01/04/2018   Long term (current) use of anticoagulants 03/29/2017   Lumbar radicular pain 01/19/2016   Lumbar radiculopathy 01/19/2016   Maculopapular rash 09/03/2017   Microscopic hematuria 06/20/2017   Last Assessment & Plan:  Had hematuria workup in [facility] in 2016 which negative CT and cystoscopy. UA with 2+ blood last visit - we discussed recommendation for repeat workup at 5 years or if degree of hematuria progresses.    Multiple nodules of lung 06/12/2016   Nasal discharge 02/25/2016   Last Assessment & Plan:  Trial zyrtec and flonase   Nephrolithiasis 07/23/2015   Overview:  x 3  Last Assessment & Plan:  By [place] has left nephrolithiasis, but not visible by KUB. Will check CT renal colic next year to assess both stone burden as well as to surveil AML.   Overview:  x 3  Last Assessment & Plan:  Has 35m nonobstructing LUP stone - not visible by KUB.  Will check renal [place]2019 - he will contact office sooner if symptomatic.    Non-sustained ventricular tachycardia 03/29/2017   Nonsustained ventricular tachycardia 03/29/2017   Other hyperlipidemia 03/29/2017   Palpitations 10/01/2017   Pleural effusion, bilateral 08/09/2017   Pneumonia due to COVID-19 virus 02/07/2021   Post-nasal drainage 02/25/2016   Last Assessment & Plan:  Trial zyrtec and flonase   Prostate cancer screening 06/20/2017   Last Assessment & Plan:  Recommend  continued annual CaP screening until within 10 years of life expectancy. Given good health and fhx of longevity, would anticipate CaP screening to continue until age 75  PSA today and again in one year on day of visit.   Pulmonary arterial hypertension ([place] 02/20/2018   Pulmonary edema    Pulmonary hypertension ([place] 08/09/2017   Pulmonary nodules 06/12/2016   S/P AVR 03/20/2016   S/P AVR (aortic valve replacement) 03/20/2016   Strain of deltoid muscle, initial encounter    Supratherapeutic INR 07/26/2017   Syncope and collapse 02/01/2016   Typical atrial flutter ([place] 02/01/2016    Past Surgical History:  Procedure Laterality Date   CHOLECYSTECTOMY     CORONARY ARTERY BYPASS GRAFT     EXPLORATORY LAPAROTOMY  07/30/2017   FOOT SURGERY     FRACTURE SURGERY Right    wrist and forearm   HERNIA REPAIR     MECHANICAL AORTIC VALVE REPLACEMENT     NASAL SINUS SURGERY     RIGHT HEART CATH N/A 01/18/2018   Procedure: RIGHT HEART CATH;  Surgeon: BJolaine Artist MD;  [facility];  Service: Cardiovascular;  Laterality: N/A;   RIGHT HEART CATH N/A 05/14/2018   Procedure: RIGHT HEART CATH;  Surgeon: BJolaine Artist MD;  [facility];  Service: Cardiovascular;  Laterality: N/A;   RIGHT HEART CATH N/A 09/29/2021   Procedure: RIGHT HEART CATH;  Surgeon: BJolaine Artist MD;  [facility];  Service: Cardiovascular;  Laterality: N/A;   TEE WITHOUT CARDIOVERSION N/A 05/21/2018   Procedure: TRANSESOPHAGEAL ECHOCARDIOGRAM (TEE);  Surgeon: BJolaine Artist MD;  [facility];  Service: Cardiovascular;  Laterality: N/A;   UPPER GASTROINTESTINAL ENDOSCOPY  07/12/2017   Patchy areas of mucosal inflammation noted in the antrum with edema,erthema and ulcerations. Bx. Chronicfocally active gastritis.   VASECTOMY      Family History  Problem Relation Age of Onset  Asthma Mother    Arthritis Mother    Heart attack Father    Hypertension Father     Stroke Paternal Grandmother    Colon cancer Neg Hx     Social History   Tobacco Use   Smoking status: Former    Packs/day: 2.00    Years: 34.00    Pack years: 68.00    Types: Cigarettes    Quit date: 07/16/2000    Years since quitting: 21.4   Smokeless tobacco: Never  Substance Use Topics   Alcohol use: Not Currently    Subjective:     I connected with [name] on 12/20/21 at  3:40 PM EST by a telephone call and verified that I am speaking with the correct person using two identifiers.   I discussed the limitations of evaluation and management by telemedicine and the availability of in person appointments. The patient expressed understanding and agreed to proceed. Provider in office/ patient is at home; provider and patient are only 2 people on telephone call.   2-3 day history of flu like symptoms; + body aches, fever averaging 101; able to keep Gatorade down; has not taken home COVID test-notes that "my whole church has this."     Objective:  Vitals:   12/20/21 1535  BP: 126/76  Pulse: 64  Temp: 100 F (37.8 C)  TempSrc: Oral  SpO2: 97%  Weight: 143 lb (64.9 kg)  Height: _0  (1.753 m)    Lungs: Respirations unlabored;  Neurologic: Alert and oriented; speech intact;   Assessment:  1. Fever, unspecified fever cause   2. Viral illness     Plan:  I did recommend that patient take home COVID test and call back if this is positive; will call in Zofran to help with nausea; stressed importance of staying hydrated/ resting; encouraged to use Tylenol to help with body aches; follow up worse, no better;  Time spent 10 minutes  No follow-ups on file.  No orders of the defined types were placed in this encounter.   Requested Prescriptions   Signed Prescriptions Disp Refills   ondansetron (ZOFRAN) 8 MG tablet 20 tablet 0    Sig: Take 1 tablet (8 mg total) by mouth every 8 (eight) hours as needed for nausea or vomiting.

## 2021-12-20 NOTE — Telephone Encounter (Signed)
The pt called in stated that he has the flu and wanted dr. Bettina Gavia to prescribe tamiflu but he hadn't confirmed it by testing. I advised the pt that they needed to be seen at an urgent care and/or pcp. As we are cardiology we cannot fill those meds especially without testing.

## 2021-12-23 DIAGNOSIS — M25511 Pain in right shoulder: Secondary | ICD-10-CM | POA: Diagnosis not present

## 2021-12-23 DIAGNOSIS — M542 Cervicalgia: Secondary | ICD-10-CM | POA: Diagnosis not present

## 2021-12-27 ENCOUNTER — Telehealth: Payer: Self-pay | Admitting: Medical

## 2021-12-27 ENCOUNTER — Other Ambulatory Visit (HOSPITAL_COMMUNITY): Payer: Medicare Other

## 2021-12-27 NOTE — Telephone Encounter (Signed)
Pt stated he started medication for his stomach last week. Now he is having an issue where he passes gas fluid comes out. He stated  he cant drive here due to being on pain meds for his back and he doesn't know how to stop it. Please advise.

## 2021-12-27 NOTE — Telephone Encounter (Signed)
pt would like meds for his sinus infection symps [facility] [phone] [address], [place] AT [place]  [address], [place] [address]  Phone:  [phone]  Fax:  [phone]

## 2021-12-27 NOTE — Telephone Encounter (Signed)
Pt would like rx sent to: [id number] [address], [place] [address]  [address], [place] [address]  Phone:  [phone]  Fax:  [phone]

## 2021-12-27 NOTE — Telephone Encounter (Signed)
There are two messages.  One is for the new medication he was started on for his stomach is he passing gas and fluid comes out.  Second message he would like something for sinus infection symptoms.  He is unable to come in due to being on pain meds, he cant drive.

## 2021-12-28 NOTE — Telephone Encounter (Signed)
Appt made

## 2021-12-29 ENCOUNTER — Other Ambulatory Visit: Payer: Self-pay | Admitting: Medical

## 2021-12-29 ENCOUNTER — Telehealth (INDEPENDENT_AMBULATORY_CARE_PROVIDER_SITE_OTHER): Payer: Medicare Other | Admitting: Medical

## 2021-12-29 VITALS — HR 64 | Temp 96.7°F | Wt 130.0 lb

## 2021-12-29 DIAGNOSIS — R195 Other fecal abnormalities: Secondary | ICD-10-CM | POA: Diagnosis not present

## 2021-12-29 DIAGNOSIS — K219 Gastro-esophageal reflux disease without esophagitis: Secondary | ICD-10-CM | POA: Diagnosis not present

## 2021-12-29 DIAGNOSIS — R110 Nausea: Secondary | ICD-10-CM | POA: Diagnosis not present

## 2021-12-29 MED ORDER — ONDANSETRON HCL 8 MG PO TABS: 8.0000 mg | ORAL_TABLET | Freq: Three times a day (TID) | ORAL | 0 refills | Status: DC | PRN

## 2021-12-29 MED ORDER — FAMOTIDINE 20 MG PO TABS: 20.0000 mg | ORAL_TABLET | Freq: Every day | ORAL | 0 refills | Status: DC

## 2021-12-29 NOTE — Patient Instructions (Addendum)
For diarrhea use immodium. over the counter  and bland food. Avoid fruit juices and soda. Also no fried foods.   For gerd aciphex and add on pepcid/fomatadine.  For nausea will refill zofran  Advise can continue zanaflex per orthopedist instruction. But if have to drive not to use. Keep PT appointment to avoid frozen shoulder.  If loose stool persist need office visit so can do stool panel studies.  If signs/symptoms worsen or change notify us. If severe then ED evaluation.

## 2021-12-29 NOTE — Progress Notes (Signed)
° °  Subjective:    Patient ID: Lance Hicks, male    DOB: 01/04/1946, 76 y.o.   MRN: [id number]  HPI  Virtual Visit via Telephone Note  I connected with Lance Hicks on 12/29/21 at  9:00 AM EST by telephone and verified that I am speaking with the correct person using two identifiers.  Location: Patient: home Provider: office   I discussed the limitations, risks, security and privacy concerns of performing an evaluation and management service by telephone and the availability of in person appointments. I also discussed with the patient that there may be a patient responsible charge related to this service. The patient expressed understanding and agreed to proceed.   History of Present Illness: Phone visit since he did not want to come in. He lives past [place] in [place].  Pt reports weeks ago he fell. Saw for shoulder pain. Evaluated by ortho. They gave zanaflex.   Pt states recent loose stools couple of times a day. When passes gas will have small amount of loose moist stool.  He has history of gerd. Was given aciphex. Occasional upset stomach.  About month ago he thinks he had viral illness. About 10 days. Body aches, fevers and chills. That has resolved. He thinks he had flu. Pt did rapid covid  test after seeing Mickel Baas and it was negative. He had nausea and given zofran. Still  having some nausea but better.  Pt had called asking me to call in antibiotic. I wanted him to come in but he states can't since zanaflex sedates him.    Observations/Objective: General- no acute distress, pleasant and alert.    Assessment and Plan: Patient Instructions  For diarrhea use immodium. over the counter  and bland food. Avoid fruit juices and soda. Also no fried foods.   For gerd aciphex and add on pepcid/fomatadine.  For nausea will refill zofran  Advise can continue zanaflex per orthopedist instruction. But if have to drive not to use. Keep PT appointment to  avoid frozen shoulder.  If loose stool persist need office visit so can do stool panel studies.  If signs/symptoms worsen or change notify us. If severe then ED evaluation.       [name], PA-C   Follow Up Instructions:    I discussed the assessment and treatment plan with the patient. The patient was provided an opportunity to ask questions and all were answered. The patient agreed with the plan and demonstrated an understanding of the instructions.   The patient was advised to call back or seek an in-person evaluation if the symptoms worsen or if the condition fails to improve as anticipated.  Time spent with patient today was  24 minutes which consisted of chart revdiew, discussing diagnosis, work up treatment and documentation.    [name], PA-C   Review of Systems     Objective:   Physical Exam        Assessment & Plan:

## 2022-01-02 NOTE — Telephone Encounter (Signed)
Advanced Heart Failure Patient Advocate Encounter  Tried to submit prior authorization for Opsumit. Received the following message: This medication or product was previously approved on [id number] from 2021-12-25 to 2022-12-24.  Audry Riles, PharmD, BCPS, BCCP, CPP Heart Failure Clinic Pharmacist [phone]

## 2022-01-02 NOTE — Telephone Encounter (Signed)
Advanced Heart Failure Patient Advocate Encounter  The patient was approved for a TAF [employer] grant. This grant will cover the cost of Uptravi, Opsumit and Tadalafil regardless of cost. Counsellor to patient as well. Eligibility 12/25/21-12/24/22  Member Number [id number] Group Number [id number]  Claims Processing Information Pharmacy Claims PCN: AS BIN: [id number] Processing: 08 Phone: [phone] Fax: [place], CPhT

## 2022-01-03 ENCOUNTER — Other Ambulatory Visit: Payer: Self-pay

## 2022-01-03 ENCOUNTER — Telehealth: Payer: Self-pay | Admitting: Medical

## 2022-01-03 ENCOUNTER — Other Ambulatory Visit (HOSPITAL_COMMUNITY): Payer: Medicare Other

## 2022-01-03 ENCOUNTER — Telehealth (INDEPENDENT_AMBULATORY_CARE_PROVIDER_SITE_OTHER): Payer: Medicare Other | Admitting: Physician Assistant

## 2022-01-03 ENCOUNTER — Encounter: Payer: Self-pay | Admitting: Physician Assistant

## 2022-01-03 VITALS — BP 132/76 | HR 64 | Temp 97.7°F | Ht 69.0 in | Wt 141.0 lb

## 2022-01-03 DIAGNOSIS — R197 Diarrhea, unspecified: Secondary | ICD-10-CM | POA: Diagnosis not present

## 2022-01-03 DIAGNOSIS — R052 Subacute cough: Secondary | ICD-10-CM | POA: Diagnosis not present

## 2022-01-03 MED ORDER — AMOXICILLIN-POT CLAVULANATE 875-125 MG PO TABS: 1.0000 | ORAL_TABLET | Freq: Two times a day (BID) | ORAL | 0 refills | Status: DC

## 2022-01-03 NOTE — Progress Notes (Signed)
TELEPHONE ENCOUNTER   Patient verbally agreed to telephone visit and is aware that copayment and coinsurance may apply. Patient was treated using telemedicine according to accepted telemedicine protocols.  Location of the patient: home Location of provider: [place] Names of all persons participating in the telemedicine service and role in the encounter: Inda Coke, [place] , Pura Spice  Subjective:   Chief Complaint  Patient presents with   Cough     HPI   Cough and Diarrhea Lance Hicks is a 76 y.o. who identifies as a male who was assigned male at birth, and is being seen today for cough and diarrhea.  12/27 -- virtual visit -- flu-like symptoms, was given zofran 1/5 -- virtual visit -- diarrhea, GERD, nauase was given pepcid and zofran refill  Today he tells me that he has cough x 3 weeks, expectorating clear to light yellow sputum.  Continues to have diarrhea and nausea. COVID test 3 weeks ago negative. Using Imodium and Zofran prn. He states that at the beginning of this 3 weeks ago, he ate some [employer] and felt like it tasted bad so he threw it out and tried to make himself throw it up.  Denies: chest pain, SOB, dizziness, lightheadedness, swelling in LE, vomiting  Hx of COPD per chart, however he denies this dx. Has oxygen at home; if oxygen goes <93, he will put on oxygen. He has not needed this in a few months. He has an albuterol inhaler that he has also not used in months.    Patient Active Problem List   Diagnosis Date Noted   Acute on chronic diastolic congestive heart failure, NYHA class 3 ([place]) 11/03/2021   ILD (interstitial lung disease) ([place]) 08/04/2021   Fall 06/21/2021   Asthma    Pneumonia due to COVID-19 virus 02/07/2021   Cirrhosis of liver not due to alcohol ([place]) 07/01/2019   Anemia 07/01/2019   Hypotension, chronic 06/06/2018   Enterococcal bacteremia    Pulmonary edema    Strain of deltoid muscle, initial encounter     Pulmonary arterial hypertension ([place]) 02/20/2018   Hypokalemia due to excessive renal loss of potassium 02/18/2018   Left ureteral stone 01/23/2018   Anticoagulated on Coumadin 01/04/2018   Localized edema 01/04/2018   Leukocytoclastic vasculitis ([place]) 10/01/2017   Hypersensitivity angiitis ([place]) 10/01/2017   Maculopapular rash 09/03/2017   Epidermoid cyst of skin 08/24/2017   Bilateral pleural effusion 08/09/2017   Pleural effusion, bilateral 08/09/2017   Supratherapeutic INR 07/26/2017   International normalized ratio (INR) raised 07/26/2017   Microscopic hematuria 06/20/2017   Asymptomatic microscopic hematuria 06/20/2017   Atrial flutter ([place]) 03/29/2017   Coronary artery disease involving native coronary artery of native heart with angina pectoris ([place]) 03/29/2017   H/O maze procedure 03/29/2017   History of coronary artery bypass graft 03/29/2017   Hypertensive heart disease with heart failure ([place]) 03/29/2017   Hyperlipidemia 03/29/2017   Coronary arteriosclerosis in native artery 03/29/2017   Long term (current) use of anticoagulants 03/29/2017   Non-sustained ventricular tachycardia 03/29/2017   Cough 10/17/2016   Pulmonary nodules 06/12/2016   Multiple nodules of lung 06/12/2016   COPD (chronic obstructive pulmonary disease) ([place]) 06/12/2016   Anxiety 05/10/2016   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   Allergic rhinitis 04/28/2016   [name] 03/20/2016   S/P AVR (aortic valve replacement) 03/20/2016   Dyspnea 02/01/2016   Syncope and collapse 02/01/2016   Lumbar radicular pain 01/19/2016  Lumbar radiculopathy 01/19/2016   Backache 12/14/2015   Essential hypertension 12/14/2015   Chronic midline back pain 12/14/2015   Chronic prostatitis 07/23/2015   Nephrolithiasis 07/23/2015   Chronic atrial fibrillation ([place]) 07/23/2015   Social History   Tobacco Use   Smoking status: Former    Packs/day: 2.00    Years: 34.00    Pack  years: 68.00    Types: Cigarettes    Quit date: 07/16/2000    Years since quitting: 21.4   Smokeless tobacco: Never  Substance Use Topics   Alcohol use: Not Currently    Current Outpatient Medications:    acetaminophen (TYLENOL) 500 MG tablet, Take 500-1,000 mg by mouth every 6 (six) hours as needed (pain.)., Disp: , Rfl:    ammonium lactate (LAC-HYDRIN) 12 % lotion, Apply 1 application topically as needed for dry skin., Disp: , Rfl:    amoxicillin-clavulanate (AUGMENTIN) 875-125 MG tablet, Take 1 tablet by mouth 2 (two) times daily., Disp: 20 tablet, Rfl: 0   Ascorbic Acid (VITAMIN C GUMMIE PO), Take 2 tablets by mouth daily at 12 noon., Disp: , Rfl:    aspirin EC 81 MG tablet, Take 81 mg by mouth in the morning., Disp: , Rfl:    Cholecalciferol (VITAMIN D3) 50 MCG (2000 UT) TABS, Take 2,000 Units by mouth daily at 12 noon., Disp: , Rfl:    Coenzyme Q10-Vitamin E (QUNOL ULTRA COQ10 PO), Take 1 capsule by mouth in the morning., Disp: , Rfl:    dapagliflozin propanediol (FARXIGA) 10 MG TABS tablet, Take 1 tablet (10 mg total) by mouth daily before breakfast., Disp: 30 tablet, Rfl: 6   famotidine (PEPCID) 20 MG tablet, TAKE 1 TABLET(20 MG) BY MOUTH DAILY, Disp: 90 tablet, Rfl: 0   ferrous sulfate 325 (65 FE) MG EC tablet, Take 325 mg by mouth every other day., Disp: , Rfl:    KRILL OIL PO, Take 800 mg by mouth every other day. In the morning, Disp: , Rfl:    Magnesium 200 MG CHEW, Chew 400 mg by mouth daily at 12 noon., Disp: , Rfl:    metolazone (ZAROXOLYN) 2.5 MG tablet, Take 1 tablet (2.5 mg total) by mouth as needed (for fluid)., Disp: 20 tablet, Rfl: 3   midodrine (PROAMATINE) 5 MG tablet, Take 3 tablets (15 mg total) by mouth 3 (three) times daily with meals., Disp: 810 tablet, Rfl: 6   ondansetron (ZOFRAN) 8 MG tablet, Take 1 tablet (8 mg total) by mouth every 8 (eight) hours as needed for nausea or vomiting., Disp: 20 tablet, Rfl: 0   ondansetron (ZOFRAN) 8 MG tablet, Take 1 tablet (8  mg total) by mouth every 8 (eight) hours as needed for nausea or vomiting., Disp: 20 tablet, Rfl: 0   OPSUMIT 10 MG tablet, TAKE 1 TABLET DAILY, Disp: 30 tablet, Rfl: 11   potassium chloride SA (KLOR-CON M) 20 MEQ tablet, Take 3 tablets (60 mEq total) by mouth 2 (two) times daily., Disp: 180 tablet, Rfl: 6   RABEprazole (ACIPHEX) 20 MG tablet, Take 1 tablet (20 mg total) by mouth daily., Disp: 180 tablet, Rfl: 1   rosuvastatin (CRESTOR) 5 MG tablet, TAKE 1 TABLET(5 MG) BY MOUTH DAILY, Disp: 90 tablet, Rfl: 3   spironolactone (ALDACTONE) 25 MG tablet, Take 1 tablet (25 mg total) by mouth daily., Disp: 30 tablet, Rfl: 3   tadalafil, PAH, (ADCIRCA) 20 MG tablet, Take 40 mg by mouth in the morning., Disp: , Rfl:    tiZANidine (ZANAFLEX) 2 MG tablet,  Take 1 tablet (2 mg total) by mouth 3 (three) times daily., Disp: 90 tablet, Rfl: 1   torsemide (DEMADEX) 20 MG tablet, Take 2 tablets (40 mg total) by mouth 2 (two) times daily., Disp: 130 tablet, Rfl: 4   Turmeric 500 MG TABS, Take 1 capsule by mouth daily in the afternoon. With Ginger 50 mg, Disp: , Rfl:    UPTRAVI 400 MCG TABS, TAKE 1 TABLET (400 MCG) EVERY MORNING., Disp: 30 tablet, Rfl: 11   vitamin B-12 (CYANOCOBALAMIN) 1000 MCG tablet, Take 1,000 mcg by mouth daily at 12 noon., Disp: , Rfl:    warfarin (COUMADIN) 5 MG tablet, TAKE 1/2 DAILY DAILY AS DIRECTED BY COUMADIN CLINIC (Patient taking differently: TAKE 1/2 tab TTS and 1 tab MWF  AS DIRECTED BY COUMADIN CLINIC), Disp: 30 tablet, Rfl: 5   Zinc 50 MG TABS, Take 50 mg by mouth every other day. In the afternoon, Disp: , Rfl:    albuterol (VENTOLIN HFA) 108 (90 Base) MCG/ACT inhaler, Inhale 2 puffs into the lungs every 6 (six) hours as needed for wheezing or shortness of breath. (Patient not taking: Reported on 12/20/2021), Disp: 8 g, Rfl: 6 Allergies  Allergen Reactions   Pantoprazole Sodium Nausea Only    Gets gassy and starting itching like crazy Gets gassy and starting itching like crazy    Valium [Diazepam] Other (See Comments)    HA and Abd pain.   Tape Rash and Other (See Comments)    Surgical tape   Wound Dressing Adhesive Other (See Comments) and Rash    Surgical tape Surgical tape Surgical tape    Assessment & Plan:   1. Subacute cough   2. Diarrhea, unspecified type    Discussed need for in office care however patient refused, states that he is unable to drive anywhere currently. Offered trial of augmentin antibiotic to cover for possible URI and intra-abdominal infection, with a VERY LOW threshold to go to the ER if any new/worsening symptoms. Discussed that if symptoms do not improve, he will need to see his PCP. Edwyna Ready will need blood work, vitals, stool studies, potentially imaging. Encouraged patient to keep follow-up appointments with specialists in order to follow-up on ongoing abnormal blood work and chronic medical issues.  He is aware that his potassium and sodium runs low and that it is critical to make sure that if he has any weakness or new symptoms that this needs to be followed up on ASAP and that if untreated this can be fatal.   No orders of the defined types were placed in this encounter.  Meds ordered this encounter  Medications   amoxicillin-clavulanate (AUGMENTIN) 875-125 MG tablet    Sig: Take 1 tablet by mouth 2 (two) times daily.    Dispense:  20 tablet    Refill:  0    Order Specific Question:   Supervising Provider    Answer:   Maryruth Eve    Inda Coke, PA 01/03/2022  Time spent with the patient: 15 minutes, spent in obtaining information about his symptoms, reviewing his previous labs, evaluations, and treatments, counseling him about his condition (please see the discussed topics above), and developing a plan to further investigate it; he had a number of questions which I addressed.

## 2022-01-08 ENCOUNTER — Telehealth: Payer: Self-pay | Admitting: Physician Assistant

## 2022-01-08 NOTE — Telephone Encounter (Signed)
Patient called because he got the flu and was put on some medications for the flu within his volume status increased a great deal.    He has since stopped this medications, but feels that he is recovering well from the flu.  However, he is getting more short of breath and cannot get his shoes on.  He is compliant with his torsemide 2 tablets twice daily and has been taking metolazone as well.  He is taking the Aldactone and potassium as directed.  Requested that he increase the torsemide to 80 mg twice daily for today and tomorrow.  Increase the potassium to 60 mEq 3 times daily.  Continue the metolazone.  I will route this to Dr. Haroldine Laws.  He is requesting that home health come out and give him a dose of IV Lasix, but I explained I could not do this on the weekend.  He will need a BMET soon, hopefully at a CHF office visit.  Last BMET: BMET    Component Value Date/Time   NA 124 (L) 11/30/2021 1426   K 3.4 (L) 11/30/2021 1426   CL 84 (L) 11/30/2021 1426   CO2 28 11/30/2021 1426   GLUCOSE 97 11/30/2021 1426   BUN 30 (H) 11/30/2021 1426   CREATININE 1.73 (H) 11/30/2021 1426   CALCIUM 9.3 11/30/2021 1426   GFRNONAA 41 (L) 11/30/2021 1426   Lianne Carreto, PA-C 01/08/2022 11:04 AM

## 2022-01-09 NOTE — Telephone Encounter (Signed)
Unable to reach pt, Left message to call back

## 2022-01-09 NOTE — Telephone Encounter (Signed)
Left message to call back  

## 2022-01-10 ENCOUNTER — Other Ambulatory Visit (HOSPITAL_COMMUNITY): Payer: Self-pay

## 2022-01-10 NOTE — Telephone Encounter (Signed)
Patient left vm on triage line very upset refusing to take potassium because his brother is allergic to potassium, patient stated he didn't want a callback but I called back anyway, no answer, left message to return call.

## 2022-01-11 NOTE — Telephone Encounter (Signed)
Pt is scheduled for labs tomorrow. He doesn't feel he is doing much better and asked again about home health coming out to give IV lasix.  Routed to [place]

## 2022-01-11 NOTE — Telephone Encounter (Signed)
Pt returned call and apologized for his earlier message. Pt scheduled lab appt.

## 2022-01-12 ENCOUNTER — Other Ambulatory Visit: Payer: Self-pay

## 2022-01-12 ENCOUNTER — Emergency Department (HOSPITAL_COMMUNITY): Payer: Medicare Other

## 2022-01-12 ENCOUNTER — Encounter (HOSPITAL_COMMUNITY): Payer: Self-pay

## 2022-01-12 ENCOUNTER — Emergency Department (HOSPITAL_COMMUNITY)
Admission: EM | Admit: 2022-01-12 | Discharge: 2022-01-12 | Discharge status: Against Medical Advice | Payer: Medicare Other | Attending: Student | Admitting: Student

## 2022-01-12 ENCOUNTER — Ambulatory Visit (HOSPITAL_COMMUNITY)
Admission: RE | Admit: 2022-01-12 | Discharge: 2022-01-12 | Disposition: A | Discharge status: Home / Self Care | Payer: Medicare Other | Source: Ambulatory Visit | Attending: Internal Medicine | Admitting: Internal Medicine

## 2022-01-12 DIAGNOSIS — E871 Hypo-osmolality and hyponatremia: Secondary | ICD-10-CM | POA: Insufficient documentation

## 2022-01-12 DIAGNOSIS — R635 Abnormal weight gain: Secondary | ICD-10-CM | POA: Diagnosis not present

## 2022-01-12 DIAGNOSIS — I5032 Chronic diastolic (congestive) heart failure: Secondary | ICD-10-CM | POA: Insufficient documentation

## 2022-01-12 DIAGNOSIS — R9431 Abnormal electrocardiogram [ECG] [EKG]: Secondary | ICD-10-CM | POA: Diagnosis not present

## 2022-01-12 DIAGNOSIS — Z5321 Procedure and treatment not carried out due to patient leaving prior to being seen by health care provider: Secondary | ICD-10-CM | POA: Insufficient documentation

## 2022-01-12 DIAGNOSIS — I517 Cardiomegaly: Secondary | ICD-10-CM | POA: Diagnosis not present

## 2022-01-12 DIAGNOSIS — R2243 Localized swelling, mass and lump, lower limb, bilateral: Secondary | ICD-10-CM | POA: Insufficient documentation

## 2022-01-12 DIAGNOSIS — M7989 Other specified soft tissue disorders: Secondary | ICD-10-CM | POA: Diagnosis not present

## 2022-01-12 LAB — CBC WITH DIFFERENTIAL/PLATELET
Abs Immature Granulocytes: 0.01 10*3/uL (ref 0.00–0.07)
Basophils Absolute: 0 10*3/uL (ref 0.0–0.1)
Basophils Relative: 1 %
Eosinophils Absolute: 0.1 10*3/uL (ref 0.0–0.5)
Eosinophils Relative: 3 %
HCT: 36.3 % — ABNORMAL LOW (ref 39.0–52.0)
Hemoglobin: 11.6 g/dL — ABNORMAL LOW (ref 13.0–17.0)
Immature Granulocytes: 0 %
Lymphocytes Relative: 12 %
Lymphs Abs: 0.5 10*3/uL — ABNORMAL LOW (ref 0.7–4.0)
MCH: 25.7 pg — ABNORMAL LOW (ref 26.0–34.0)
MCHC: 32 g/dL (ref 30.0–36.0)
MCV: 80.3 fL (ref 80.0–100.0)
Monocytes Absolute: 0.7 10*3/uL (ref 0.1–1.0)
Monocytes Relative: 17 %
Neutro Abs: 2.9 10*3/uL (ref 1.7–7.7)
Neutrophils Relative %: 67 %
Platelets: 189 10*3/uL (ref 150–400)
RBC: 4.52 MIL/uL (ref 4.22–5.81)
RDW: 20.9 % — ABNORMAL HIGH (ref 11.5–15.5)
WBC: 4.4 10*3/uL (ref 4.0–10.5)
nRBC: 0 % (ref 0.0–0.2)

## 2022-01-12 LAB — BASIC METABOLIC PANEL
Anion gap: 9 (ref 5–15)
Anion gap: 18 — ABNORMAL HIGH (ref 5–15)
BUN: 24 mg/dL — ABNORMAL HIGH (ref 8–23)
BUN: 27 mg/dL — ABNORMAL HIGH (ref 8–23)
CO2: 31 mmol/L (ref 22–32)
CO2: 30 mmol/L (ref 22–32)
Calcium: 10.1 mg/dL (ref 8.9–10.3)
Calcium: 10.9 mg/dL — ABNORMAL HIGH (ref 8.9–10.3)
Chloride: 81 mmol/L — ABNORMAL LOW (ref 98–111)
Chloride: 78 mmol/L — ABNORMAL LOW (ref 98–111)
Creatinine, Ser: 2.46 mg/dL — ABNORMAL HIGH (ref 0.61–1.24)
Creatinine, Ser: 2.39 mg/dL — ABNORMAL HIGH (ref 0.61–1.24)
GFR, Estimated: 27 mL/min — ABNORMAL LOW (ref >60)
GFR, Estimated: 28 mL/min — ABNORMAL LOW (ref >60)
Glucose, Bld: 103 mg/dL — ABNORMAL HIGH (ref 70–99)
Glucose, Bld: 95 mg/dL (ref 70–99)
Potassium: 4.4 mmol/L (ref 3.5–5.1)
Potassium: 4.3 mmol/L (ref 3.5–5.1)
Sodium: 121 mmol/L — ABNORMAL LOW (ref 135–145)
Sodium: 126 mmol/L — ABNORMAL LOW (ref 135–145)

## 2022-01-12 LAB — BRAIN NATRIURETIC PEPTIDE: B Natriuretic Peptide: 1341.7 pg/mL — ABNORMAL HIGH (ref 0.0–100.0)

## 2022-01-12 LAB — PROTIME-INR
INR: 1.3 — ABNORMAL HIGH (ref 0.8–1.2)
Prothrombin Time: 16.4 seconds — ABNORMAL HIGH (ref 11.4–15.2)

## 2022-01-12 LAB — TROPONIN I (HIGH SENSITIVITY): Troponin I (High Sensitivity): 49 ng/L — ABNORMAL HIGH (ref <18)

## 2022-01-12 NOTE — ED Triage Notes (Signed)
Pt arrived POV c/o bilateral leg swelling. Pt states I have too much fluid on me. Pt denies SHOB or any pain anywhere.

## 2022-01-12 NOTE — ED Notes (Signed)
Pt left AMA °

## 2022-01-12 NOTE — Telephone Encounter (Signed)
error

## 2022-01-12 NOTE — ED Provider Triage Note (Signed)
Emergency Medicine Provider Triage Evaluation Note  Lance Hicks , a 76 y.o. male  was evaluated in triage.  Pt complains of swelling in legs. Gained 17 pounds in 2 days. Not SOB or in CP.  States his cardiologist told him to "quadruple all of his meds".  States he is taking quadruple of "all of them".   Review of Systems  Positive: Lower extremity swelling Negative: CP, SOB  Physical Exam  BP 129/68 (BP Location: Left Arm)    Pulse 76    Temp 98.6 F (37 C) (Oral)    Resp 16    Ht 5' 9" (1.753 m)    Wt 66.2 kg    SpO2 99%    BMI 21.56 kg/m  Gen:   Awake, no distress   Resp:  Normal effort  MSK:   Moves extremities without difficulty  Other:  Bilateral pitting edema, irregularly irregular rhythm  Medical Decision Making  Medically screening exam initiated at 3:02 PM.  Appropriate orders placed.  Lance Hicks was informed that the remainder of the evaluation will be completed by another provider, this initial triage assessment does not replace that evaluation, and the importance of remaining in the ED until their evaluation is complete.     Sherrill Raring, PA-C 01/12/22 1504

## 2022-01-17 ENCOUNTER — Telehealth: Payer: Self-pay

## 2022-01-17 DIAGNOSIS — J439 Emphysema, unspecified: Secondary | ICD-10-CM | POA: Diagnosis not present

## 2022-01-17 DIAGNOSIS — I7121 Aneurysm of the ascending aorta, without rupture: Secondary | ICD-10-CM | POA: Diagnosis not present

## 2022-01-17 DIAGNOSIS — N179 Acute kidney failure, unspecified: Secondary | ICD-10-CM | POA: Diagnosis not present

## 2022-01-17 DIAGNOSIS — Z881 Allergy status to other antibiotic agents status: Secondary | ICD-10-CM | POA: Diagnosis not present

## 2022-01-17 DIAGNOSIS — T65891A Toxic effect of other specified substances, accidental (unintentional), initial encounter: Secondary | ICD-10-CM | POA: Diagnosis not present

## 2022-01-17 DIAGNOSIS — N200 Calculus of kidney: Secondary | ICD-10-CM | POA: Diagnosis not present

## 2022-01-17 DIAGNOSIS — R911 Solitary pulmonary nodule: Secondary | ICD-10-CM | POA: Diagnosis not present

## 2022-01-17 DIAGNOSIS — R112 Nausea with vomiting, unspecified: Secondary | ICD-10-CM | POA: Diagnosis not present

## 2022-01-17 DIAGNOSIS — R1111 Vomiting without nausea: Secondary | ICD-10-CM | POA: Diagnosis not present

## 2022-01-17 DIAGNOSIS — F419 Anxiety disorder, unspecified: Secondary | ICD-10-CM | POA: Diagnosis not present

## 2022-01-17 DIAGNOSIS — K5730 Diverticulosis of large intestine without perforation or abscess without bleeding: Secondary | ICD-10-CM | POA: Diagnosis not present

## 2022-01-17 DIAGNOSIS — J189 Pneumonia, unspecified organism: Secondary | ICD-10-CM | POA: Diagnosis not present

## 2022-01-17 DIAGNOSIS — I2510 Atherosclerotic heart disease of native coronary artery without angina pectoris: Secondary | ICD-10-CM | POA: Diagnosis not present

## 2022-01-17 DIAGNOSIS — I700 Atherosclerosis of aorta: Secondary | ICD-10-CM | POA: Diagnosis not present

## 2022-01-17 DIAGNOSIS — Z7982 Long term (current) use of aspirin: Secondary | ICD-10-CM | POA: Diagnosis not present

## 2022-01-17 DIAGNOSIS — R1110 Vomiting, unspecified: Secondary | ICD-10-CM | POA: Diagnosis not present

## 2022-01-17 DIAGNOSIS — I517 Cardiomegaly: Secondary | ICD-10-CM | POA: Diagnosis not present

## 2022-01-17 DIAGNOSIS — E871 Hypo-osmolality and hyponatremia: Secondary | ICD-10-CM | POA: Diagnosis not present

## 2022-01-17 DIAGNOSIS — J9690 Respiratory failure, unspecified, unspecified whether with hypoxia or hypercapnia: Secondary | ICD-10-CM | POA: Diagnosis not present

## 2022-01-17 DIAGNOSIS — R110 Nausea: Secondary | ICD-10-CM | POA: Diagnosis not present

## 2022-01-17 DIAGNOSIS — K209 Esophagitis, unspecified without bleeding: Secondary | ICD-10-CM | POA: Diagnosis not present

## 2022-01-17 DIAGNOSIS — Z7901 Long term (current) use of anticoagulants: Secondary | ICD-10-CM | POA: Diagnosis not present

## 2022-01-17 DIAGNOSIS — I4891 Unspecified atrial fibrillation: Secondary | ICD-10-CM | POA: Diagnosis not present

## 2022-01-17 DIAGNOSIS — R531 Weakness: Secondary | ICD-10-CM | POA: Diagnosis not present

## 2022-01-17 DIAGNOSIS — E876 Hypokalemia: Secondary | ICD-10-CM | POA: Diagnosis not present

## 2022-01-17 DIAGNOSIS — Z9981 Dependence on supplemental oxygen: Secondary | ICD-10-CM | POA: Diagnosis not present

## 2022-01-17 DIAGNOSIS — J45909 Unspecified asthma, uncomplicated: Secondary | ICD-10-CM | POA: Diagnosis not present

## 2022-01-17 DIAGNOSIS — N281 Cyst of kidney, acquired: Secondary | ICD-10-CM | POA: Diagnosis not present

## 2022-01-17 DIAGNOSIS — R001 Bradycardia, unspecified: Secondary | ICD-10-CM | POA: Diagnosis not present

## 2022-01-17 DIAGNOSIS — Z87891 Personal history of nicotine dependence: Secondary | ICD-10-CM | POA: Diagnosis not present

## 2022-01-17 DIAGNOSIS — J811 Chronic pulmonary edema: Secondary | ICD-10-CM | POA: Diagnosis not present

## 2022-01-17 DIAGNOSIS — J90 Pleural effusion, not elsewhere classified: Secondary | ICD-10-CM | POA: Diagnosis not present

## 2022-01-17 DIAGNOSIS — M1990 Unspecified osteoarthritis, unspecified site: Secondary | ICD-10-CM | POA: Diagnosis not present

## 2022-01-17 DIAGNOSIS — K2970 Gastritis, unspecified, without bleeding: Secondary | ICD-10-CM | POA: Diagnosis not present

## 2022-01-17 DIAGNOSIS — Z79899 Other long term (current) drug therapy: Secondary | ICD-10-CM | POA: Diagnosis not present

## 2022-01-17 DIAGNOSIS — R918 Other nonspecific abnormal finding of lung field: Secondary | ICD-10-CM | POA: Diagnosis not present

## 2022-01-17 DIAGNOSIS — J9811 Atelectasis: Secondary | ICD-10-CM | POA: Diagnosis not present

## 2022-01-17 NOTE — Telephone Encounter (Signed)
Pt has missed anticoagulation appts consecutively since last visit on 10/18/21. Attempted to call pt, no answer and unable to leave voicemail. Called pt's brother Lennette Bihari. Lennette Bihari stated he lived in [place] but he knows pt was currently in [facility]. He also stated pt' mental status was declining. He provided me with Pt's daughter's and son in [place] number who is now the pt's main caretakers.   Attempted to call pt's daughter, Jeani Hawking as well pt's son in law, Waunita Schooner; however, no answer. Left message to call back.

## 2022-01-19 DIAGNOSIS — I4891 Unspecified atrial fibrillation: Secondary | ICD-10-CM | POA: Diagnosis not present

## 2022-01-21 DIAGNOSIS — I4891 Unspecified atrial fibrillation: Secondary | ICD-10-CM

## 2022-01-22 ENCOUNTER — Telehealth: Payer: Self-pay | Admitting: Medical

## 2022-01-22 NOTE — Telephone Encounter (Signed)
Printed drug change request form. Please get that off printer and place in my folder. If not there then please reprint from media. Sending this message from home on Sunday. Thanks.

## 2022-01-23 ENCOUNTER — Telehealth: Payer: Self-pay

## 2022-01-23 ENCOUNTER — Inpatient Hospital Stay (HOSPITAL_COMMUNITY)
Admission: EM | Admit: 2022-01-23 | Discharge: 2022-02-05 | DRG: 291 | Disposition: A | Discharge status: Home Health | Payer: Medicare Other | Attending: Internal Medicine | Admitting: Internal Medicine

## 2022-01-23 ENCOUNTER — Telehealth (HOSPITAL_COMMUNITY): Payer: Self-pay | Admitting: *Deleted

## 2022-01-23 ENCOUNTER — Emergency Department (HOSPITAL_COMMUNITY): Payer: Medicare Other

## 2022-01-23 ENCOUNTER — Encounter (HOSPITAL_COMMUNITY): Payer: Self-pay | Admitting: Emergency Medicine

## 2022-01-23 ENCOUNTER — Telehealth (HOSPITAL_COMMUNITY): Payer: Self-pay

## 2022-01-23 DIAGNOSIS — I5043 Acute on chronic combined systolic (congestive) and diastolic (congestive) heart failure: Secondary | ICD-10-CM | POA: Diagnosis not present

## 2022-01-23 DIAGNOSIS — N411 Chronic prostatitis: Secondary | ICD-10-CM | POA: Diagnosis present

## 2022-01-23 DIAGNOSIS — I5084 End stage heart failure: Secondary | ICD-10-CM | POA: Diagnosis present

## 2022-01-23 DIAGNOSIS — I071 Rheumatic tricuspid insufficiency: Secondary | ICD-10-CM | POA: Diagnosis present

## 2022-01-23 DIAGNOSIS — J90 Pleural effusion, not elsewhere classified: Secondary | ICD-10-CM

## 2022-01-23 DIAGNOSIS — K7031 Alcoholic cirrhosis of liver with ascites: Secondary | ICD-10-CM | POA: Diagnosis present

## 2022-01-23 DIAGNOSIS — R5381 Other malaise: Secondary | ICD-10-CM | POA: Diagnosis not present

## 2022-01-23 DIAGNOSIS — Z736 Limitation of activities due to disability: Secondary | ICD-10-CM | POA: Diagnosis not present

## 2022-01-23 DIAGNOSIS — I5082 Biventricular heart failure: Secondary | ICD-10-CM | POA: Diagnosis present

## 2022-01-23 DIAGNOSIS — I509 Heart failure, unspecified: Secondary | ICD-10-CM | POA: Diagnosis not present

## 2022-01-23 DIAGNOSIS — R1313 Dysphagia, pharyngeal phase: Secondary | ICD-10-CM | POA: Diagnosis not present

## 2022-01-23 DIAGNOSIS — N179 Acute kidney failure, unspecified: Secondary | ICD-10-CM | POA: Diagnosis present

## 2022-01-23 DIAGNOSIS — E876 Hypokalemia: Secondary | ICD-10-CM

## 2022-01-23 DIAGNOSIS — Z8616 Personal history of COVID-19: Secondary | ICD-10-CM

## 2022-01-23 DIAGNOSIS — Z87891 Personal history of nicotine dependence: Secondary | ICD-10-CM

## 2022-01-23 DIAGNOSIS — Z8619 Personal history of other infectious and parasitic diseases: Secondary | ICD-10-CM

## 2022-01-23 DIAGNOSIS — Z681 Body mass index (BMI) 19 or less, adult: Secondary | ICD-10-CM

## 2022-01-23 DIAGNOSIS — R1032 Left lower quadrant pain: Secondary | ICD-10-CM | POA: Diagnosis not present

## 2022-01-23 DIAGNOSIS — R41841 Cognitive communication deficit: Secondary | ICD-10-CM | POA: Diagnosis not present

## 2022-01-23 DIAGNOSIS — Z7189 Other specified counseling: Secondary | ICD-10-CM | POA: Diagnosis not present

## 2022-01-23 DIAGNOSIS — I5042 Chronic combined systolic (congestive) and diastolic (congestive) heart failure: Secondary | ICD-10-CM | POA: Diagnosis present

## 2022-01-23 DIAGNOSIS — Z9049 Acquired absence of other specified parts of digestive tract: Secondary | ICD-10-CM

## 2022-01-23 DIAGNOSIS — N1832 Chronic kidney disease, stage 3b: Secondary | ICD-10-CM | POA: Diagnosis present

## 2022-01-23 DIAGNOSIS — K5900 Constipation, unspecified: Secondary | ICD-10-CM | POA: Diagnosis present

## 2022-01-23 DIAGNOSIS — Z789 Other specified health status: Secondary | ICD-10-CM | POA: Diagnosis not present

## 2022-01-23 DIAGNOSIS — Z951 Presence of aortocoronary bypass graft: Secondary | ICD-10-CM

## 2022-01-23 DIAGNOSIS — F419 Anxiety disorder, unspecified: Secondary | ICD-10-CM | POA: Diagnosis present

## 2022-01-23 DIAGNOSIS — I4821 Permanent atrial fibrillation: Secondary | ICD-10-CM | POA: Diagnosis present

## 2022-01-23 DIAGNOSIS — E119 Type 2 diabetes mellitus without complications: Secondary | ICD-10-CM | POA: Diagnosis not present

## 2022-01-23 DIAGNOSIS — Z66 Do not resuscitate: Secondary | ICD-10-CM | POA: Diagnosis not present

## 2022-01-23 DIAGNOSIS — Z888 Allergy status to other drugs, medicaments and biological substances status: Secondary | ICD-10-CM

## 2022-01-23 DIAGNOSIS — D509 Iron deficiency anemia, unspecified: Secondary | ICD-10-CM | POA: Diagnosis not present

## 2022-01-23 DIAGNOSIS — I9589 Other hypotension: Secondary | ICD-10-CM | POA: Diagnosis present

## 2022-01-23 DIAGNOSIS — E43 Unspecified severe protein-calorie malnutrition: Secondary | ICD-10-CM | POA: Diagnosis present

## 2022-01-23 DIAGNOSIS — R231 Pallor: Secondary | ICD-10-CM | POA: Diagnosis present

## 2022-01-23 DIAGNOSIS — J9611 Chronic respiratory failure with hypoxia: Secondary | ICD-10-CM | POA: Diagnosis not present

## 2022-01-23 DIAGNOSIS — I270 Primary pulmonary hypertension: Secondary | ICD-10-CM | POA: Diagnosis not present

## 2022-01-23 DIAGNOSIS — E785 Hyperlipidemia, unspecified: Secondary | ICD-10-CM | POA: Diagnosis not present

## 2022-01-23 DIAGNOSIS — I272 Pulmonary hypertension, unspecified: Secondary | ICD-10-CM

## 2022-01-23 DIAGNOSIS — Z79899 Other long term (current) drug therapy: Secondary | ICD-10-CM

## 2022-01-23 DIAGNOSIS — I951 Orthostatic hypotension: Secondary | ICD-10-CM | POA: Diagnosis present

## 2022-01-23 DIAGNOSIS — Z8701 Personal history of pneumonia (recurrent): Secondary | ICD-10-CM

## 2022-01-23 DIAGNOSIS — N189 Chronic kidney disease, unspecified: Secondary | ICD-10-CM | POA: Diagnosis not present

## 2022-01-23 DIAGNOSIS — J449 Chronic obstructive pulmonary disease, unspecified: Secondary | ICD-10-CM | POA: Diagnosis present

## 2022-01-23 DIAGNOSIS — R1012 Left upper quadrant pain: Secondary | ICD-10-CM | POA: Diagnosis not present

## 2022-01-23 DIAGNOSIS — Z7982 Long term (current) use of aspirin: Secondary | ICD-10-CM

## 2022-01-23 DIAGNOSIS — D631 Anemia in chronic kidney disease: Secondary | ICD-10-CM | POA: Diagnosis not present

## 2022-01-23 DIAGNOSIS — I517 Cardiomegaly: Secondary | ICD-10-CM | POA: Diagnosis not present

## 2022-01-23 DIAGNOSIS — F10139 Alcohol abuse with withdrawal, unspecified: Secondary | ICD-10-CM | POA: Diagnosis present

## 2022-01-23 DIAGNOSIS — I110 Hypertensive heart disease with heart failure: Secondary | ICD-10-CM | POA: Diagnosis not present

## 2022-01-23 DIAGNOSIS — E871 Hypo-osmolality and hyponatremia: Secondary | ICD-10-CM | POA: Diagnosis not present

## 2022-01-23 DIAGNOSIS — Z20822 Contact with and (suspected) exposure to covid-19: Secondary | ICD-10-CM | POA: Diagnosis present

## 2022-01-23 DIAGNOSIS — I4891 Unspecified atrial fibrillation: Secondary | ICD-10-CM | POA: Diagnosis not present

## 2022-01-23 DIAGNOSIS — F1010 Alcohol abuse, uncomplicated: Secondary | ICD-10-CM | POA: Diagnosis not present

## 2022-01-23 DIAGNOSIS — I2609 Other pulmonary embolism with acute cor pulmonale: Secondary | ICD-10-CM | POA: Diagnosis not present

## 2022-01-23 DIAGNOSIS — I2721 Secondary pulmonary arterial hypertension: Secondary | ICD-10-CM | POA: Diagnosis present

## 2022-01-23 DIAGNOSIS — E7849 Other hyperlipidemia: Secondary | ICD-10-CM | POA: Diagnosis present

## 2022-01-23 DIAGNOSIS — I4510 Unspecified right bundle-branch block: Secondary | ICD-10-CM | POA: Diagnosis present

## 2022-01-23 DIAGNOSIS — Z87442 Personal history of urinary calculi: Secondary | ICD-10-CM

## 2022-01-23 DIAGNOSIS — R54 Age-related physical debility: Secondary | ICD-10-CM | POA: Diagnosis not present

## 2022-01-23 DIAGNOSIS — M310 Hypersensitivity angiitis: Secondary | ICD-10-CM | POA: Diagnosis present

## 2022-01-23 DIAGNOSIS — R109 Unspecified abdominal pain: Secondary | ICD-10-CM | POA: Diagnosis present

## 2022-01-23 DIAGNOSIS — K2970 Gastritis, unspecified, without bleeding: Secondary | ICD-10-CM | POA: Diagnosis present

## 2022-01-23 DIAGNOSIS — K219 Gastro-esophageal reflux disease without esophagitis: Secondary | ICD-10-CM | POA: Diagnosis not present

## 2022-01-23 DIAGNOSIS — Z952 Presence of prosthetic heart valve: Secondary | ICD-10-CM

## 2022-01-23 DIAGNOSIS — R531 Weakness: Secondary | ICD-10-CM | POA: Diagnosis not present

## 2022-01-23 DIAGNOSIS — I482 Chronic atrial fibrillation, unspecified: Secondary | ICD-10-CM | POA: Diagnosis not present

## 2022-01-23 DIAGNOSIS — R791 Abnormal coagulation profile: Secondary | ICD-10-CM | POA: Diagnosis present

## 2022-01-23 DIAGNOSIS — E1122 Type 2 diabetes mellitus with diabetic chronic kidney disease: Secondary | ICD-10-CM | POA: Diagnosis present

## 2022-01-23 DIAGNOSIS — Z7901 Long term (current) use of anticoagulants: Secondary | ICD-10-CM

## 2022-01-23 DIAGNOSIS — Z711 Person with feared health complaint in whom no diagnosis is made: Secondary | ICD-10-CM | POA: Diagnosis not present

## 2022-01-23 DIAGNOSIS — Z7401 Bed confinement status: Secondary | ICD-10-CM | POA: Diagnosis not present

## 2022-01-23 DIAGNOSIS — E782 Mixed hyperlipidemia: Secondary | ICD-10-CM | POA: Diagnosis not present

## 2022-01-23 DIAGNOSIS — G8929 Other chronic pain: Secondary | ICD-10-CM | POA: Diagnosis present

## 2022-01-23 DIAGNOSIS — R0602 Shortness of breath: Secondary | ICD-10-CM | POA: Diagnosis not present

## 2022-01-23 DIAGNOSIS — N200 Calculus of kidney: Secondary | ICD-10-CM | POA: Diagnosis present

## 2022-01-23 DIAGNOSIS — I959 Hypotension, unspecified: Secondary | ICD-10-CM

## 2022-01-23 DIAGNOSIS — I130 Hypertensive heart and chronic kidney disease with heart failure and stage 1 through stage 4 chronic kidney disease, or unspecified chronic kidney disease: Secondary | ICD-10-CM | POA: Diagnosis present

## 2022-01-23 DIAGNOSIS — N183 Chronic kidney disease, stage 3 unspecified: Secondary | ICD-10-CM | POA: Diagnosis not present

## 2022-01-23 DIAGNOSIS — I5021 Acute systolic (congestive) heart failure: Secondary | ICD-10-CM | POA: Diagnosis not present

## 2022-01-23 DIAGNOSIS — I5033 Acute on chronic diastolic (congestive) heart failure: Secondary | ICD-10-CM | POA: Diagnosis present

## 2022-01-23 DIAGNOSIS — K7030 Alcoholic cirrhosis of liver without ascites: Secondary | ICD-10-CM | POA: Diagnosis not present

## 2022-01-23 DIAGNOSIS — M4316 Spondylolisthesis, lumbar region: Secondary | ICD-10-CM | POA: Diagnosis not present

## 2022-01-23 DIAGNOSIS — J99 Respiratory disorders in diseases classified elsewhere: Secondary | ICD-10-CM | POA: Diagnosis not present

## 2022-01-23 DIAGNOSIS — N2889 Other specified disorders of kidney and ureter: Secondary | ICD-10-CM | POA: Diagnosis not present

## 2022-01-23 DIAGNOSIS — Z515 Encounter for palliative care: Secondary | ICD-10-CM | POA: Diagnosis not present

## 2022-01-23 DIAGNOSIS — M6259 Muscle wasting and atrophy, not elsewhere classified, multiple sites: Secondary | ICD-10-CM | POA: Diagnosis not present

## 2022-01-23 DIAGNOSIS — Z8249 Family history of ischemic heart disease and other diseases of the circulatory system: Secondary | ICD-10-CM

## 2022-01-23 DIAGNOSIS — I2510 Atherosclerotic heart disease of native coronary artery without angina pectoris: Secondary | ICD-10-CM | POA: Diagnosis present

## 2022-01-23 DIAGNOSIS — K5730 Diverticulosis of large intestine without perforation or abscess without bleeding: Secondary | ICD-10-CM | POA: Diagnosis not present

## 2022-01-23 DIAGNOSIS — R059 Cough, unspecified: Secondary | ICD-10-CM | POA: Diagnosis not present

## 2022-01-23 DIAGNOSIS — J849 Interstitial pulmonary disease, unspecified: Secondary | ICD-10-CM | POA: Diagnosis not present

## 2022-01-23 DIAGNOSIS — N281 Cyst of kidney, acquired: Secondary | ICD-10-CM | POA: Diagnosis not present

## 2022-01-23 DIAGNOSIS — Z9109 Other allergy status, other than to drugs and biological substances: Secondary | ICD-10-CM

## 2022-01-23 DIAGNOSIS — R279 Unspecified lack of coordination: Secondary | ICD-10-CM | POA: Diagnosis not present

## 2022-01-23 DIAGNOSIS — D649 Anemia, unspecified: Secondary | ICD-10-CM | POA: Diagnosis not present

## 2022-01-23 DIAGNOSIS — Z825 Family history of asthma and other chronic lower respiratory diseases: Secondary | ICD-10-CM

## 2022-01-23 DIAGNOSIS — R278 Other lack of coordination: Secondary | ICD-10-CM | POA: Diagnosis not present

## 2022-01-23 LAB — CBC WITH DIFFERENTIAL/PLATELET
Abs Immature Granulocytes: 0 10*3/uL (ref 0.00–0.07)
Basophils Absolute: 0 10*3/uL (ref 0.0–0.1)
Basophils Relative: 1 %
Eosinophils Absolute: 0.1 10*3/uL (ref 0.0–0.5)
Eosinophils Relative: 3 %
HCT: 35 % — ABNORMAL LOW (ref 39.0–52.0)
Hemoglobin: 11 g/dL — ABNORMAL LOW (ref 13.0–17.0)
Lymphocytes Relative: 3 %
Lymphs Abs: 0.1 10*3/uL — ABNORMAL LOW (ref 0.7–4.0)
MCH: 25.8 pg — ABNORMAL LOW (ref 26.0–34.0)
MCHC: 31.4 g/dL (ref 30.0–36.0)
MCV: 82 fL (ref 80.0–100.0)
Monocytes Absolute: 0.2 10*3/uL (ref 0.1–1.0)
Monocytes Relative: 5 %
Neutro Abs: 4 10*3/uL (ref 1.7–7.7)
Neutrophils Relative %: 88 %
Platelets: 132 10*3/uL — ABNORMAL LOW (ref 150–400)
RBC: 4.27 MIL/uL (ref 4.22–5.81)
RDW: 21.3 % — ABNORMAL HIGH (ref 11.5–15.5)
WBC: 4.5 10*3/uL (ref 4.0–10.5)
nRBC: 0 % (ref 0.0–0.2)
nRBC: 0 /100 WBC

## 2022-01-23 LAB — BASIC METABOLIC PANEL
Anion gap: 9 (ref 5–15)
BUN: 11 mg/dL (ref 8–23)
CO2: 25 mmol/L (ref 22–32)
Calcium: 9.1 mg/dL (ref 8.9–10.3)
Chloride: 97 mmol/L — ABNORMAL LOW (ref 98–111)
Creatinine, Ser: 1.15 mg/dL (ref 0.61–1.24)
GFR, Estimated: >60 mL/min (ref >60)
Glucose, Bld: 97 mg/dL (ref 70–99)
Potassium: 3.5 mmol/L (ref 3.5–5.1)
Sodium: 131 mmol/L — ABNORMAL LOW (ref 135–145)

## 2022-01-23 LAB — LACTIC ACID, PLASMA: Lactic Acid, Venous: 1.6 mmol/L (ref 0.5–1.9)

## 2022-01-23 LAB — RESP PANEL BY RT-PCR (FLU A&B, COVID) ARPGX2
Influenza A by PCR: NEGATIVE
Influenza B by PCR: NEGATIVE
SARS Coronavirus 2 by RT PCR: NEGATIVE

## 2022-01-23 LAB — BRAIN NATRIURETIC PEPTIDE: B Natriuretic Peptide: 1831.8 pg/mL — ABNORMAL HIGH (ref 0.0–100.0)

## 2022-01-23 LAB — PROTIME-INR
INR: 3.7 — ABNORMAL HIGH (ref 0.8–1.2)
Prothrombin Time: 36.5 s — ABNORMAL HIGH (ref 11.4–15.2)

## 2022-01-23 MED ORDER — ACETAMINOPHEN 325 MG PO TABS
650.0000 mg | ORAL_TABLET | ORAL | Status: DC | PRN
  Administered 2022-01-24 – 2022-01-31 (×7): 650 mg via ORAL
  Filled 2022-01-23 (×7): qty 2

## 2022-01-23 MED ORDER — LORAZEPAM 1 MG PO TABS
1.0000 mg | ORAL_TABLET | ORAL | Status: AC | PRN
  Administered 2022-01-24: 1 mg via ORAL
  Administered 2022-01-25: 2 mg via ORAL
  Administered 2022-01-26: 1 mg via ORAL
  Filled 2022-01-23 (×3): qty 1
  Filled 2022-01-23: qty 2

## 2022-01-23 MED ORDER — PANTOPRAZOLE SODIUM 40 MG PO TBEC
40.0000 mg | DELAYED_RELEASE_TABLET | Freq: Every day | ORAL | Status: DC
  Administered 2022-01-24 – 2022-02-05 (×13): 40 mg via ORAL
  Filled 2022-01-23 (×14): qty 1

## 2022-01-23 MED ORDER — THIAMINE HCL 100 MG/ML IJ SOLN
100.0000 mg | Freq: Every day | INTRAMUSCULAR | Status: DC
  Administered 2022-01-25: 100 mg via INTRAVENOUS
  Filled 2022-01-23: qty 2

## 2022-01-23 MED ORDER — ROSUVASTATIN CALCIUM 5 MG PO TABS
5.0000 mg | ORAL_TABLET | Freq: Every day | ORAL | Status: DC
  Administered 2022-01-24 – 2022-02-05 (×13): 5 mg via ORAL
  Filled 2022-01-23 (×13): qty 1

## 2022-01-23 MED ORDER — FOLIC ACID 1 MG PO TABS
1.0000 mg | ORAL_TABLET | Freq: Every day | ORAL | Status: DC
  Administered 2022-01-23 – 2022-02-05 (×14): 1 mg via ORAL
  Filled 2022-01-23 (×14): qty 1

## 2022-01-23 MED ORDER — INSULIN ASPART 100 UNIT/ML IJ SOLN
0.0000 [IU] | Freq: Three times a day (TID) | INTRAMUSCULAR | Status: DC
  Administered 2022-01-26 – 2022-01-28 (×4): 1 [IU] via SUBCUTANEOUS
  Administered 2022-01-28: 2 [IU] via SUBCUTANEOUS
  Administered 2022-01-29: 1 [IU] via SUBCUTANEOUS
  Administered 2022-01-29: 2 [IU] via SUBCUTANEOUS
  Administered 2022-01-30: 1 [IU] via SUBCUTANEOUS

## 2022-01-23 MED ORDER — MACITENTAN 10 MG PO TABS: 10.0000 mg | ORAL_TABLET | Freq: Every day | ORAL | Status: DC

## 2022-01-23 MED ORDER — FAMOTIDINE 20 MG PO TABS
10.0000 mg | ORAL_TABLET | Freq: Every day | ORAL | Status: DC
  Administered 2022-01-24 – 2022-02-05 (×13): 10 mg via ORAL
  Filled 2022-01-23 (×13): qty 1

## 2022-01-23 MED ORDER — POTASSIUM CHLORIDE CRYS ER 20 MEQ PO TBCR
40.0000 meq | EXTENDED_RELEASE_TABLET | Freq: Two times a day (BID) | ORAL | Status: DC
  Administered 2022-01-23: 40 meq via ORAL
  Filled 2022-01-23: qty 2

## 2022-01-23 MED ORDER — ONDANSETRON HCL 4 MG/2ML IJ SOLN
4.0000 mg | Freq: Four times a day (QID) | INTRAMUSCULAR | Status: DC | PRN
  Administered 2022-01-27 – 2022-01-28 (×2): 4 mg via INTRAVENOUS
  Filled 2022-01-23 (×2): qty 2

## 2022-01-23 MED ORDER — LORAZEPAM 2 MG/ML IJ SOLN: 1.0000 mg | INTRAMUSCULAR | Status: AC | PRN

## 2022-01-23 MED ORDER — MIDODRINE HCL 5 MG PO TABS
15.0000 mg | ORAL_TABLET | Freq: Three times a day (TID) | ORAL | Status: DC
  Administered 2022-01-23 – 2022-02-05 (×39): 15 mg via ORAL
  Filled 2022-01-23 (×41): qty 3

## 2022-01-23 MED ORDER — POTASSIUM CHLORIDE CRYS ER 20 MEQ PO TBCR: 60.0000 meq | EXTENDED_RELEASE_TABLET | Freq: Two times a day (BID) | ORAL | Status: DC

## 2022-01-23 MED ORDER — SPIRONOLACTONE 25 MG PO TABS
25.0000 mg | ORAL_TABLET | Freq: Every day | ORAL | Status: DC
  Administered 2022-01-24 – 2022-02-05 (×13): 25 mg via ORAL
  Filled 2022-01-23 (×13): qty 1

## 2022-01-23 MED ORDER — FERROUS SULFATE 325 (65 FE) MG PO TABS
325.0000 mg | ORAL_TABLET | ORAL | Status: DC
  Administered 2022-01-24 – 2022-02-05 (×7): 325 mg via ORAL
  Filled 2022-01-23 (×8): qty 1

## 2022-01-23 MED ORDER — WARFARIN SODIUM 1 MG PO TABS
1.0000 mg | ORAL_TABLET | Freq: Once | ORAL | Status: AC
  Administered 2022-01-23: 1 mg via ORAL
  Filled 2022-01-23: qty 1

## 2022-01-23 MED ORDER — THIAMINE HCL 100 MG PO TABS
100.0000 mg | ORAL_TABLET | Freq: Every day | ORAL | Status: DC
  Administered 2022-01-23 – 2022-02-05 (×13): 100 mg via ORAL
  Filled 2022-01-23 (×14): qty 1

## 2022-01-23 MED ORDER — SODIUM CHLORIDE 0.9% FLUSH: 3.0000 mL | INTRAVENOUS | Status: DC | PRN

## 2022-01-23 MED ORDER — TADALAFIL (PAH) 20 MG PO TABS: 40.0000 mg | ORAL_TABLET | Freq: Every morning | ORAL | Status: DC

## 2022-01-23 MED ORDER — TIZANIDINE HCL 4 MG PO TABS
2.0000 mg | ORAL_TABLET | Freq: Three times a day (TID) | ORAL | Status: DC
  Administered 2022-01-23 – 2022-02-05 (×38): 2 mg via ORAL
  Filled 2022-01-23 (×39): qty 1

## 2022-01-23 MED ORDER — WARFARIN - PHARMACIST DOSING INPATIENT: Freq: Every day | Status: DC

## 2022-01-23 MED ORDER — POTASSIUM CHLORIDE 20 MEQ PO PACK
60.0000 meq | PACK | Freq: Two times a day (BID) | ORAL | Status: DC
  Administered 2022-01-23 – 2022-01-24 (×2): 60 meq via ORAL
  Filled 2022-01-23 (×2): qty 3

## 2022-01-23 MED ORDER — ONDANSETRON HCL 4 MG PO TABS
8.0000 mg | ORAL_TABLET | Freq: Three times a day (TID) | ORAL | Status: DC | PRN
  Administered 2022-01-26: 8 mg via ORAL
  Filled 2022-01-23: qty 2

## 2022-01-23 MED ORDER — FUROSEMIDE 10 MG/ML IJ SOLN
80.0000 mg | Freq: Two times a day (BID) | INTRAMUSCULAR | Status: DC
  Administered 2022-01-23 – 2022-01-24 (×2): 80 mg via INTRAVENOUS
  Filled 2022-01-23 (×2): qty 8

## 2022-01-23 MED ORDER — ACETAMINOPHEN 500 MG PO TABS: 500.0000 mg | ORAL_TABLET | Freq: Four times a day (QID) | ORAL | Status: DC | PRN

## 2022-01-23 MED ORDER — SODIUM CHLORIDE 0.9 % IV SOLN: 250.0000 mL | INTRAVENOUS | Status: DC | PRN

## 2022-01-23 MED ORDER — SELEXIPAG 200 MCG PO TABS
200.0000 ug | ORAL_TABLET | Freq: Two times a day (BID) | ORAL | Status: DC
  Administered 2022-01-23 – 2022-02-05 (×26): 200 ug via ORAL
  Filled 2022-01-23 (×27): qty 1

## 2022-01-23 MED ORDER — MIDODRINE HCL 5 MG PO TABS
15.0000 mg | ORAL_TABLET | Freq: Once | ORAL | Status: AC
  Administered 2022-01-23: 15 mg via ORAL
  Filled 2022-01-23: qty 3

## 2022-01-23 MED ORDER — ASPIRIN EC 81 MG PO TBEC
81.0000 mg | DELAYED_RELEASE_TABLET | Freq: Every morning | ORAL | Status: DC
  Administered 2022-01-24 – 2022-02-05 (×13): 81 mg via ORAL
  Filled 2022-01-23 (×15): qty 1

## 2022-01-23 MED ORDER — SODIUM CHLORIDE 0.9% FLUSH
3.0000 mL | Freq: Two times a day (BID) | INTRAVENOUS | Status: DC
  Administered 2022-01-23 – 2022-02-05 (×23): 3 mL via INTRAVENOUS

## 2022-01-23 MED ORDER — ADULT MULTIVITAMIN W/MINERALS CH
1.0000 | ORAL_TABLET | Freq: Every day | ORAL | Status: DC
  Administered 2022-01-23 – 2022-02-05 (×14): 1 via ORAL
  Filled 2022-01-23 (×14): qty 1

## 2022-01-23 NOTE — ED Triage Notes (Addendum)
Patient here with complaint of continued shortness of breath after being discharged from [facility] with pneumonia Saturday. Patient alert, oriented, and in no apparent distress at this time, wearing 8L O2 [place].

## 2022-01-23 NOTE — ED Notes (Signed)
Pt arrived from [facility] on 8LPM per [place] and hypotensive. RN initiated IV, vitals rechecked and pt remains hypotensive but pt states he takes midodrine routinely for this and has not had any of it today. RN titrated pt to room air and monitored, pt remained at 100% on room air. Awaiting EDP at this time. Pt alert and oriented, speaking to nurse and visitor and no distress noted. Call light in reach.

## 2022-01-23 NOTE — Telephone Encounter (Signed)
Pts caregiver Kerry Kass called to report pt has low O2 sats. His O2 is in the 80's even with portable oxygen. Pt was admitted to [facility] last week because he accidentally drank carburetor cleaner thinking it was his water. He was discharged Saturday but is very weak and still vomiting and sick. She think pt needs to go back to the hospital but pt wanted advice from our office first. I called her back and left detailed vm advising her to take pt to the emergency room.

## 2022-01-23 NOTE — Progress Notes (Signed)
Pharmacy Consult for Pulmonary Hypertension Treatment   Indication - Continuation of prior to admission medication   Patient is 76 y.o.  with history of PAH on chronic Macitentan (Opsumit) PTA and will be continued while hospitalized.   Continuing this medication order as an inpatient requires that monitoring parameters per REMS requirements must be met.  Chronic therapy is under the supervision of Dr. Mahalia Longest who is enrolled in the REMS program and is being notified of continuation of therapy. A staff message in EPIC has been sent notifying the certified prescriber.  Per patient report has previously been educated on Hepatotoxicity . On admission pregnancy risk has been assessed and no monitoring required. Holding agent due to hypotension  Hepatic Function Latest Ref Rng & Units 11/06/2021 11/03/2021 09/29/2021  Total Protein 6.5 - 8.1 g/dL 7.7 7.9 7.6  Albumin 3.5 - 5.0 g/dL 3.5 3.8 3.5  AST 15 - 41 U/L 32 37 36  ALT 0 - 44 U/L _0 Alk Phosphatase 38 - 126 U/L 52 57 60  Total Bilirubin 0.3 - 1.2 mg/dL 2.1(H) 2.5(H) 2.2(H)  Bilirubin, Direct 0.0 - 0.2 mg/dL 0.7(H) 0.8(H) 0.7(H)    If any question arise or pregnancy is identified during hospitalization, contact for bosentan and macitentan: [phone]; ambrisentan: [phone].  Thank for you allowing [place] to participate in the care of this patient.   Thank you for allowing pharmacy to be a part of this patients care.  Donnald Garre, PharmD Clinical Pharmacist  Please check AMION for all [place] numbers After 10:00 PM, call [place] [phone]

## 2022-01-23 NOTE — Telephone Encounter (Signed)
Medication is prescribed by Dr.Gupta I will send it to Dr.Gupta's office

## 2022-01-23 NOTE — Telephone Encounter (Signed)
Spoke with Patient advised pt needed to go to emergency room, see previous phone note from [place]

## 2022-01-23 NOTE — ED Provider Notes (Signed)
[facility] EMERGENCY DEPARTMENT Provider Note   CSN: [id number] Arrival date & time: 01/23/22  1106     History  Chief Complaint  Patient presents with   Pneumonia    Lance Hicks is a 76 y.o. male.   Pneumonia Associated symptoms include shortness of breath. Pertinent negatives include no abdominal pain. Patient presents with generalized weakness.  Reportedly hypotensive.  Had recent admission to the hospital in [place] reportedly for pneumonia and accidental ingestion of some starting fluid.  Reported left a couple days ago prior to when they wanted him to leave.  I cannot see the reports from that however.  Reportedly left on antibiotics.  Also reported left on oxygen.  Has not been on oxygen prior to this.  Still on Levaquin.  Reportedly had some pneumonia.  History of pulmonary hypertension.  Has a history of arterial hypotension and is on midodrine for 3 times a day.  States he did not take his medicine this morning.  States he did not want them because he was coming here.  No fevers.  No chills.  Reportedly was hypoxic and was on 8 L of oxygen.     Home Medications Prior to Admission medications   Medication Sig Start Date End Date Taking? Authorizing Provider  acetaminophen (TYLENOL) 500 MG tablet Take 500-1,000 mg by mouth every 6 (six) hours as needed (pain.).    [provider]  albuterol (VENTOLIN HFA) 108 (90 Base) MCG/ACT inhaler Inhale 2 puffs into the lungs every 6 (six) hours as needed for wheezing or shortness of breath. Patient not taking: Reported on 12/20/2021 08/04/21   Collene Gobble, MD  ammonium lactate (LAC-HYDRIN) 12 % lotion Apply 1 application topically as needed for dry skin.    [provider]  amoxicillin-clavulanate (AUGMENTIN) 875-125 MG tablet Take 1 tablet by mouth 2 (two) times daily. 01/03/22   Inda Coke, PA  Ascorbic Acid (VITAMIN C GUMMIE PO) Take 2 tablets by mouth daily at 12 noon.    [provider]  aspirin EC 81 MG tablet Take 81 mg by mouth in the morning.    [provider]  Cholecalciferol (VITAMIN D3) 50 MCG (2000 UT) TABS Take 2,000 Units by mouth daily at 12 noon.    [provider]  Coenzyme Q10-Vitamin E (QUNOL ULTRA COQ10 PO) Take 1 capsule by mouth in the morning.    [provider]  dapagliflozin propanediol (FARXIGA) 10 MG TABS tablet Take 1 tablet (10 mg total) by mouth daily before breakfast. 10/14/21   Rafael Bihari, FNP  famotidine (PEPCID) 20 MG tablet TAKE 1 TABLET(20 MG) BY MOUTH DAILY 12/29/21   Saguier, Percell Miller, PA-C  ferrous sulfate 325 (65 FE) MG EC tablet Take 325 mg by mouth every other day.    [provider]  KRILL OIL PO Take 800 mg by mouth every other day. In the morning    [provider]  Magnesium 200 MG CHEW Chew 400 mg by mouth daily at 12 noon.    [provider]  metolazone (ZAROXOLYN) 2.5 MG tablet Take 1 tablet (2.5 mg total) by mouth as needed (for fluid). 11/09/21   Lyda Jester M, PA-C  midodrine (PROAMATINE) 5 MG tablet Take 3 tablets (15 mg total) by mouth 3 (three) times daily with meals. 12/09/21   Bensimhon, Shaune Pascal, MD  ondansetron (ZOFRAN) 8 MG tablet Take 1 tablet (8 mg total) by mouth every 8 (eight) hours as needed for nausea  or vomiting. 12/20/21   Marrian Salvage, FNP  ondansetron (ZOFRAN) 8 MG tablet Take 1 tablet (8 mg total) by mouth every 8 (eight) hours as needed for nausea or vomiting. 12/29/21   Saguier, Percell Miller, PA-C  OPSUMIT 10 MG tablet TAKE 1 TABLET DAILY 09/26/21   Bensimhon, Shaune Pascal, MD  potassium chloride SA (KLOR-CON M) 20 MEQ tablet Take 3 tablets (60 mEq total) by mouth 2 (two) times daily. 12/09/21   Bensimhon, Shaune Pascal, MD  RABEprazole (ACIPHEX) 20 MG tablet Take 1 tablet (20 mg total) by mouth daily. 11/21/21 05/20/22  Jackquline Denmark, MD  rosuvastatin (CRESTOR) 5 MG tablet TAKE 1 TABLET(5 MG) BY MOUTH DAILY 10/05/21   Bensimhon, Shaune Pascal, MD   spironolactone (ALDACTONE) 25 MG tablet Take 1 tablet (25 mg total) by mouth daily. 11/02/21   Bensimhon, Shaune Pascal, MD  tadalafil, PAH, (ADCIRCA) 20 MG tablet Take 40 mg by mouth in the morning.    [provider]  tiZANidine (ZANAFLEX) 2 MG tablet Take 1 tablet (2 mg total) by mouth 3 (three) times daily. 12/15/21 12/15/22  Pete Pelt, PA-C  torsemide (DEMADEX) 20 MG tablet Take 2 tablets (40 mg total) by mouth 2 (two) times daily. 11/09/21   Lyda Jester M, PA-C  Turmeric 500 MG TABS Take 1 capsule by mouth daily in the afternoon. With Ginger 50 mg    [provider]  UPTRAVI 400 MCG TABS TAKE 1 TABLET (400 MCG) EVERY MORNING. 11/22/21   Bensimhon, Shaune Pascal, MD  vitamin B-12 (CYANOCOBALAMIN) 1000 MCG tablet Take 1,000 mcg by mouth daily at 12 noon.    [provider]  warfarin (COUMADIN) 5 MG tablet TAKE 1/2 DAILY DAILY AS DIRECTED BY COUMADIN CLINIC Patient taking differently: TAKE 1/2 tab TTS and 1 tab MWF  AS DIRECTED BY COUMADIN CLINIC 10/05/21   Clegg, Amy D, NP  Zinc 50 MG TABS Take 50 mg by mouth every other day. In the afternoon    [provider]      Allergies    Pantoprazole sodium, Valium [diazepam], Tape, and Wound dressing adhesive    Review of Systems   Review of Systems  Constitutional:  Positive for fatigue. Negative for appetite change.  Respiratory:  Positive for shortness of breath.   Cardiovascular:  Positive for leg swelling.  Gastrointestinal:  Negative for abdominal pain.  Musculoskeletal:  Negative for back pain.  Skin:  Negative for rash.  Neurological:  Negative for weakness.   Physical Exam Updated Vital Signs BP (!) 83/56    Pulse (!) 53    Temp 98 F (36.7 C) (Oral)    Resp 17    SpO2 100%  Physical Exam Vitals and nursing note reviewed.  HENT:     Head: Atraumatic.  Eyes:     Pupils: Pupils are equal, round, and reactive to light.  Cardiovascular:     Rate and Rhythm: Regular rhythm.  Pulmonary:      Breath sounds: Rales present.     Comments: Harsh breath sounds bilateral bases. Abdominal:     Tenderness: There is no abdominal tenderness.  Musculoskeletal:     Right lower leg: Edema present.     Left lower leg: Edema present.     Comments: Moderate pitting edema bilateral lower extremities.  Neurological:     Mental Status: He is alert and oriented to person, place, and time.    ED Results / Procedures / Treatments   Labs (all labs ordered are listed,  but only abnormal results are displayed) Labs Reviewed  BASIC METABOLIC PANEL - Abnormal; Notable for the following components:      Result Value   Sodium 131 (*)    Chloride 97 (*)    All other components within normal limits  CBC WITH DIFFERENTIAL/PLATELET - Abnormal; Notable for the following components:   Hemoglobin 11.0 (*)    HCT 35.0 (*)    MCH 25.8 (*)    RDW 21.3 (*)    Platelets 132 (*)    Lymphs Abs 0.1 (*)    All other components within normal limits  BRAIN NATRIURETIC PEPTIDE - Abnormal; Notable for the following components:   B Natriuretic Peptide 1,831.8 (*)    All other components within normal limits  PROTIME-INR - Abnormal; Notable for the following components:   Prothrombin Time 36.5 (*)    INR 3.7 (*)    All other components within normal limits  CULTURE, BLOOD (ROUTINE X 2)  CULTURE, BLOOD (ROUTINE X 2)  LACTIC ACID, PLASMA  LACTIC ACID, PLASMA  BASIC METABOLIC PANEL    EKG EKG Interpretation  Date/Time:  Monday January 23 2022 11:27:30 EST Ventricular Rate:  83 PR Interval:    QRS Duration: 108 QT Interval:  418 QTC Calculation: 491 R Axis:   -2 Text Interpretation: Atrial fibrillation with premature ventricular or aberrantly conducted complexes Incomplete right bundle branch block Prolonged QT Abnormal ECG When compared with ECG of 23-Jan-2022 11:17, PREVIOUS ECG IS PRESENT Confirmed by Davonna Belling [phone]) on 01/23/2022 3:07:09 PM  Radiology DG Chest 2 View  Result Date:  01/23/2022 CLINICAL DATA:  A 76 year old male presents for evaluation of shortness of breath. EXAM: CHEST - 2 VIEW COMPARISON:  January 12, 2022. FINDINGS: Signs of median sternotomy for aortic valve replacement. Cardiomediastinal contours are stable. Increased interstitial markings throughout the chest. Blunting of the RIGHT costodiaphragmatic sulcus and to a lesser extent small effusion also on the LEFT. Bibasilar airspace disease. Fullness of central pulmonary vasculature. On limited assessment there is no acute skeletal process. IMPRESSION: 1. Findings suggest pulmonary edema with bilateral effusions, greater on the RIGHT. 2. Bibasilar airspace disease, more likely atelectasis in the current context. Correlate with signs of heart failure. Electronically Signed   By: Zetta Bills M.D.   On: 01/23/2022 13:01    Procedures Procedures    Medications Ordered in ED Medications  midodrine (PROAMATINE) tablet 15 mg (has no administration in time range)  furosemide (LASIX) injection 80 mg (has no administration in time range)  potassium chloride SA (KLOR-CON M) CR tablet 40 mEq (has no administration in time range)  midodrine (PROAMATINE) tablet 15 mg (15 mg Oral Given 01/23/22 1542)    ED Course/ Medical Decision Making/ A&P                           Medical Decision Making Amount and/or Complexity of Data Reviewed Independent Historian: friend External Data Reviewed: notes. Labs: ordered. Radiology: independent interpretation performed. ECG/medicine tests: independent interpretation performed. Decision-making details documented in ED Course.  Risk Prescription drug management.  Critical Care Total time providing critical care: 30-74 minutes Initial differential diagnosis for shortness of breath includes pneumonia, CHF, pneumothorax, anemia. Patient presents with shortness of breath and fatigue.  Has been feeling bad for last 2 days.  Reportedly hypotensive and reportedly had been hypoxic  at home.  Recent admission to [facility] although I have not been able to review the records from this.  Does have a history of pulmonary hypertension and CHF.  Has a history of arterial hypotension is on midodrine.  Did not take the midodrine this morning but had taken trazodone and is now more sleepy.  Renal function is good.  White count normal.  Chest x-ray reviewed and independently interpreted by me and showed volume overload.  Discussed with cardiology.  Discussed with Amy Clegg and Dr. Jeffie Pollock.  IV Lasix ordered.  Patient was given oral fluid since his mouth was still dry.  Hypotension is both acute on chronic.  No plan for pressors at this time.  Request admission to internal medicine.  Do not feel that is if this is a pneumonia at this time.  No need for further antibiotics.  Will discuss with hospitalist..        Final Clinical Impression(s) / ED Diagnoses Final diagnoses:  Acute on chronic congestive heart failure, unspecified heart failure type [facility])  Pulmonary hypertension ([place])    Rx / DC Orders ED Discharge Orders     None         Davonna Belling, MD 01/23/22 1733

## 2022-01-23 NOTE — Telephone Encounter (Signed)
Opened in error °

## 2022-01-23 NOTE — Progress Notes (Signed)
ANTICOAGULATION CONSULT NOTE - Follow Up Consult  Pharmacy Consult for Warfarin Indication:  metal AVR  Allergies  Allergen Reactions   Pantoprazole Sodium Nausea Only    Gets gassy and starting itching like crazy Gets gassy and starting itching like crazy   Valium [Diazepam] Other (See Comments)    HA and Abd pain.   Tape Rash and Other (See Comments)    Surgical tape   Wound Dressing Adhesive Other (See Comments) and Rash    Surgical tape Surgical tape Surgical tape    Vital Signs: Temp: 98 F (36.7 C) (01/30 1435) Temp Source: Oral (01/30 1435) BP: 100/63 (01/30 1915) Pulse Rate: 52 (01/30 1915)  Labs: Recent Labs    01/23/22 1145 01/23/22 1510  HGB 11.0*  --   HCT 35.0*  --   PLT 132*  --   LABPROT  --  36.5*  INR  --  3.7*  CREATININE 1.15  --     Estimated Creatinine Clearance: 52.8 mL/min (by C-G formula based on SCr of 1.15 mg/dL).   Medications:  Scheduled:   [START ON 01/24/2022] aspirin EC  81 mg Oral q AM   [START ON 01/24/2022] famotidine  10 mg Oral Daily   ferrous sulfate  325 mg Oral QODAY   folic acid  1 mg Oral Daily   furosemide  80 mg Intravenous BID   [START ON 01/24/2022] insulin aspart  0-9 Units Subcutaneous TID WC   midodrine  15 mg Oral TID WC   multivitamin with minerals  1 tablet Oral Daily   [START ON 01/24/2022] pantoprazole  40 mg Oral Daily   potassium chloride  60 mEq Oral BID   [START ON 01/24/2022] rosuvastatin  5 mg Oral Daily   Selexipag  200 mcg Oral BID   sodium chloride flush  3 mL Intravenous Q12H   spironolactone  25 mg Oral Daily   thiamine  100 mg Oral Daily   Or   thiamine  100 mg Intravenous Daily   tiZANidine  2 mg Oral TID   warfarin  1 mg Oral Once   [START ON 01/24/2022] Warfarin - Pharmacist Dosing Inpatient   Does not apply q1600    Assessment: 76 y.o. male with medical history significant of chronic systolic CHF NYHA class III, HTN, CAD and aortic stenosis status post CABG and metal AVR on Coumadin,  orthostatic hypotension, COPD and pulmonary hypertension, IIDM, PAF, liver cirrhosis secondary to alcohol abuse, presented with increasing leg swelling.  Home regimen per patient: warfarin 16m MonWed and warfarin 2.56mall other days.  Last visit to clinic 10/18/2021 1/30 INR 3.7  Goal of Therapy:  INR goal 2.5-3.0 (per clinic note) Monitor platelets by anticoagulation protocol: Yes   Plan:  Will give wafarin 58m75m1 to prevent large drop in INR Follow up INR tomorrow for further dosing Daily INR/CBC ordered  Thank you for allowing pharmacy to be a part of this patients care.  EdeDonnald GarreharmD Clinical Pharmacist  Please check AMION for all [facility] After 10:00 PM, call [phone]

## 2022-01-23 NOTE — Consult Note (Addendum)
Advanced Heart Failure Team Consult Note   Primary Physician: [name], PA-C PCP-Cardiologist:  None  Reason for Consultation: Heart Failure   HPI:    Lance Hicks is seen today for evaluation of heart failure at the request of Dr Alvino Chapel.   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 Also has COPD (quit smoking in 2002 - 1 ppd x 25 years), HL, HTN, hyponatremia, and PAH.    In 8/18 admitted for acute gallstone pancreatitis. Underwent lap cholecystectomy on 07/29/2017. On 8/6 he developed hypotension and was taken back for open laparotomy due to bleeding from an unclear omental source. He was given fluids and 8 units PRBC perioperatively. He says his breathing has been bad since that time.    Seen at [facility] in 2018 had hi-res CT, PFTs and VQ (low prob). Felt to have Golds I COPD with asthma overlap and early pulmonary fibrosis with PAH. He was also seen by Cardiology. Echo 8/18 as below showed normal LV function with septal flattening and RVSP 65-7mHG. 14-day event monitor showed 10-beat run NSVT. Felt to have chronic AF and PAH. No further testing ordered.   Underwent RHC on 01/18/18 with severe PAH and cor pulmonale. Macitentan and Adcirca. Subsequently started on selexipag but dose limited by cramping now on 400/200   Admitted 5/19 with symptomatic hypotension and weight gain. He had a repeat RHC and echo with bubble study (results below). He was started on midodrine for BP support. Diuresed 25 lbs with IV lasix, then transitioned to torsemide 60 mg daily. Abdominal UKoreashowed cirrhosis with no evidence of portal HTN. Pulm consulted for possible ILD on High-res chest CT.   [facility], profound fatigue. [facility] and [facility]. On doxy  for 5 weeks.    Seen 09/12/21 with NYHA IIIb-IV symptoms. ReDs 38%. Given home IV lasix with [facility].    Admitted from cath lab 09/29/21 with elevated filling pressures.  Hospitalization c/b hyponatremia requiring tolvaptan. Weight 143 lbs.  Readmitted [id number] volume overload after running out of HF meds x 4 days. This was complicated by AKI and hyponatremia.   Saw Dr BHaroldine Lawsin the HF clinic 11/30/21. Volume status was stable at that time. He continued on torsemide 40 mg twice a day + metolazone every 3rd day.   10 days ago he accidentally drank fuel injector fluid. He called Poison Control 3 days in a row but he never got a call. He had nausea and vomiting. [facility] He was treated for pneumonia and hyponatremia. He signed out AMA on Saturday. Yesterday he completed his Levaquin. He has neighbors that help check on him. Having a hard time walking. He has been staying in his chair at home. His neighbors have been giving him food.   Says he took trazodone early this morning and again around 1200.  Says he was exhausted and wanted to sleep.   [facility] with pulmonary edema and bilateral effusions > on right.  Pertinent admission labs: sodium 131, K 3.5, creatinine 1.15, hgb 11, amd BNP 1831. Given 15 mg midodrine at 1515 today.   Blood CX obtained. Lactic Acid pending.   Most recent ECHO  09/2021 EF 45-50% RV low normal.   Review of Systems: [y] = yes, _0  = no   General: Weight gain _1 ; Weight loss _2 ; Anorexia _3 ; Fatigue [ Y]; Fever _4 ; Chills _5 ; Weakness [Y ]  Cardiac: Chest pain/pressure _6 ; Resting SOB _7 ; Exertional SOB [ Y]; Orthopnea [Y ]; Pedal Edema [ Y]; Palpitations _8 ; Syncope _9 ; Presyncope _10 ; Paroxysmal nocturnal dyspnea_11   Pulmonary: Cough _12 ; Wheezing_13 ; Hemoptysis_14 ; Sputum _15 ; Snoring _16   GI: Vomiting_17 ; Dysphagia_18 ; Melena_19 ; Hematochezia _20 ; Heartburn_21 ;  Abdominal pain _22 ; Constipation _23 ; Diarrhea _24 ; BRBPR _25   GU: Hematuria_26 ; Dysuria _27 ; Nocturia_28   Vascular: Pain in legs with walking _29 ; Pain in feet with lying flat _30 ; Non-healing sores _31 ; Stroke _32 ; TIA _33 ; Slurred speech _34 ;  Neuro: Headaches_35 ; Vertigo_36 ; Seizures_37 ; Paresthesias_38 ;Blurred vision _39 ; Diplopia _40 ; Vision changes _41   Ortho/Skin: Arthritis _42 ; Joint pain [ Y]; Muscle pain _43 ; Joint swelling _44 ; Back Pain [ Y]; Rash _45   Psych: Depression_46 ; Anxiety_47   Heme: Bleeding problems _48 ; Clotting disorders _49 ; Anemia [Y ]  Endocrine: Diabetes _50 ; Thyroid dysfunction_51   Home Medications Prior to Admission medications   Medication Sig Start Date End Date Taking? Authorizing Provider  acetaminophen (TYLENOL) 500 MG tablet Take 500-1,000 mg by mouth every 6 (six) hours as needed (pain.).    [provider]  albuterol (VENTOLIN HFA) 108 (90 Base) MCG/ACT inhaler Inhale 2 puffs into the lungs every 6 (six) hours as needed for wheezing or shortness of breath. Patient not taking: Reported on 12/20/2021 08/04/21   Collene Gobble, MD  ammonium lactate (LAC-HYDRIN) 12 % lotion Apply 1 application topically as needed for dry skin.    [provider]  amoxicillin-clavulanate (AUGMENTIN) 875-125 MG tablet Take 1 tablet by mouth 2 (two) times daily. 01/03/22   Inda Coke, PA  Ascorbic Acid (VITAMIN C GUMMIE PO) Take 2 tablets by mouth daily at 12 noon.    [provider]  aspirin EC 81 MG tablet Take 81 mg by mouth in the morning.    [provider]  Cholecalciferol (VITAMIN D3) 50 MCG (2000 UT) TABS Take 2,000 Units by mouth daily at 12 noon.    [provider]  Coenzyme Q10-Vitamin E (QUNOL ULTRA COQ10 PO) Take 1 capsule by mouth in the morning.    [provider]  dapagliflozin propanediol (FARXIGA) 10 MG TABS tablet Take 1 tablet (10 mg total) by mouth daily before breakfast. 10/14/21   Rafael Bihari,  FNP  famotidine (PEPCID) 20 MG tablet TAKE 1 TABLET(20 MG) BY MOUTH DAILY 12/29/21   Saguier, Percell Miller, PA-C  ferrous sulfate 325 (65 FE) MG EC tablet Take 325 mg by mouth every other day.    [provider]  KRILL OIL PO Take 800 mg by mouth every other day. In the morning    [provider]  Magnesium 200 MG CHEW Chew 400 mg by mouth daily at 12 noon.    [provider]  metolazone (ZAROXOLYN) 2.5 MG tablet Take 1 tablet (2.5 mg total) by mouth as needed (for fluid). 11/09/21   Lyda Jester M, PA-C  midodrine (PROAMATINE) 5 MG tablet Take 3 tablets (15 mg total) by mouth 3 (three) times daily with meals. 12/09/21  Raetta Agostinelli, Shaune Pascal, MD  ondansetron (ZOFRAN) 8 MG tablet Take 1 tablet (8 mg total) by mouth every 8 (eight) hours as needed for nausea or vomiting. 12/20/21   Marrian Salvage, FNP  ondansetron (ZOFRAN) 8 MG tablet Take 1 tablet (8 mg total) by mouth every 8 (eight) hours as needed for nausea or vomiting. 12/29/21   Saguier, Percell Miller, PA-C  OPSUMIT 10 MG tablet TAKE 1 TABLET DAILY 09/26/21   Otilia Kareem, Shaune Pascal, MD  potassium chloride SA (KLOR-CON M) 20 MEQ tablet Take 3 tablets (60 mEq total) by mouth 2 (two) times daily. 12/09/21   Valrie Jia, Shaune Pascal, MD  RABEprazole (ACIPHEX) 20 MG tablet Take 1 tablet (20 mg total) by mouth daily. 11/21/21 05/20/22  Jackquline Denmark, MD  rosuvastatin (CRESTOR) 5 MG tablet TAKE 1 TABLET(5 MG) BY MOUTH DAILY 10/05/21   Wally Shevchenko, Shaune Pascal, MD  spironolactone (ALDACTONE) 25 MG tablet Take 1 tablet (25 mg total) by mouth daily. 11/02/21   Sharen Youngren, Shaune Pascal, MD  tadalafil, PAH, (ADCIRCA) 20 MG tablet Take 40 mg by mouth in the morning.    [provider]  tiZANidine (ZANAFLEX) 2 MG tablet Take 1 tablet (2 mg total) by mouth 3 (three) times daily. 12/15/21 12/15/22  Pete Pelt, PA-C  torsemide (DEMADEX) 20 MG tablet Take 2 tablets (40 mg total) by mouth 2 (two) times daily. 11/09/21   Lyda Jester M, PA-C   Turmeric 500 MG TABS Take 1 capsule by mouth daily in the afternoon. With Ginger 50 mg    [provider]  UPTRAVI 400 MCG TABS TAKE 1 TABLET (400 MCG) EVERY MORNING. 11/22/21   Jaidalyn Schillo, Shaune Pascal, MD  vitamin B-12 (CYANOCOBALAMIN) 1000 MCG tablet Take 1,000 mcg by mouth daily at 12 noon.    [provider]  warfarin (COUMADIN) 5 MG tablet TAKE 1/2 DAILY DAILY AS DIRECTED BY COUMADIN CLINIC Patient taking differently: TAKE 1/2 tab TTS and 1 tab MWF  AS DIRECTED BY COUMADIN CLINIC 10/05/21   Clegg, Amy D, NP  Zinc 50 MG TABS Take 50 mg by mouth every other day. 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 In light of concurrent left nephrolithiasis, will check CT renal colic next year instead of renal US.    Anticoagulated on Coumadin 01/04/2018   Anxiety 05/10/2016   Last Assessment & Plan:  Doing well off of zoloft.   Asthma    Asymptomatic microscopic hematuria 06/20/2017   Last Assessment & Plan:  Had hematuria workup in [facility] in 2016 which negative CT and cystoscopy. UA with 2+ blood last visit - we discussed recommendation for repeat workup at 5 years or if degree of hematuria progresses.    Atrial fibrillation ([place]) 03/29/2017   Atrial flutter ([place]) 03/29/2017   Backache 12/14/2015   Last Assessment & Plan:  Pain management referral for further evaluation.   Bilateral pleural effusion 08/09/2017   CHF (congestive heart failure) (HCC)    Chronic allergic rhinitis 04/28/2016   Last Assessment & Plan:  Continue astelin   Chronic anticoagulation 03/29/2017   Chronic atrial fibrillation ([place]) 07/23/2015   Last Assessment & Plan:  Coumadin and metoprolol, cardiology referral to establish care.   Chronic  midline back pain  12/14/2015   Last Assessment & Plan:  Pain management referral for further evaluation.   Chronic obstructive lung disease ([place]) 06/12/2016   With hypoxia   Chronic prostatitis 07/23/2015   Last Assessment & Plan:  Has largely resolved since stopping bike riding. Recommend annual DRE AND PSA - will see back 12/2015 for annual screening, given 1st degree fhx. To call office for recurrent prostatitis symptoms.    Chronic respiratory failure with hypoxia ([place]) 07/29/2019   Cirrhosis of liver not due to alcohol ([place]) 07/01/2019   COPD (chronic obstructive pulmonary disease) ([place]) 06/12/2016   Coronary arteriosclerosis in native artery 03/29/2017   Coronary artery disease involving native coronary artery of native heart with angina pectoris ([place]) 03/29/2017   Cough 10/17/2016   Last Assessment & Plan:  Discussed typical course for acute viral illness. If symptoms worsen or fail to improve by 7-10d, delayed ATBs, fluids, rest, NSAIDs/APAP prn. Seek care if not improving. Needs earlier INR check due to ATBs.   Dyspnea 02/01/2016   Last Assessment & Plan:  Overall improving, eval by pulm, plan for CT, neg stress test with cardiology. Recent switch to carvedilol due to side effects.   Encounter for therapeutic drug monitoring 01/06/2019   Enterococcal bacteremia    Epidermoid cyst of skin 08/24/2017   Essential hypertension 12/14/2015   Last Assessment & Plan:  Hypertension control: controlled  Medications: compliant Medication Management: as noted in orders Home blood pressure monitoring recommended additionally as needed for symptoms  The patient's care plan was reviewed and updated. Instructions and counseling were provided regarding patient goals and barriers. He was counseled to adopt a healthy lifestyle. Educational resources and self-management tools have been provided as charted in [facility] list.    H/O maze procedure 03/29/2017   [name] 03/29/2017   Overview:  2011   History of coronary  artery bypass graft 03/29/2017   Hx of CABG 03/29/2017   Hyperlipidemia 03/29/2017   Hypersensitivity angiitis ([place]) 10/01/2017   Hypertensive heart disease 03/29/2017   Hypertensive heart disease with heart failure ([place]) 03/29/2017   Hypertensive heart failure ([place]) 03/29/2017   Hypokalemia due to excessive renal loss of potassium 02/18/2018   Hypotension, chronic 06/06/2018   International normalized ratio (INR) raised 07/26/2017   Kidney stone 07/23/2015   Kidney stones 07/23/2015   Overview:  x 3  Last Assessment & Plan:  By [place] has left nephrolithiasis, but not visible by KUB. Will check CT renal colic next year to assess both stone burden as well as to surveil AML.    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 UA with 2+ blood last visit - we discussed recommendation for repeat workup at 5 years or if degree of hematuria progresses.    Multiple nodules of lung 06/12/2016   Nasal discharge 02/25/2016   Last Assessment & Plan:  Trial zyrtec and flonase   Nephrolithiasis 07/23/2015   Overview:  x 3  Last Assessment & Plan:  By [place] has left nephrolithiasis, but not visible by KUB. Will check CT renal colic next year to assess both stone burden as well as to surveil AML.  Overview:  x 3  Last Assessment & Plan:  Has 39m nonobstructing LUP stone - not visible by KUB.  Will check renal [place] - he will contact office sooner if symptomatic.    Non-sustained ventricular tachycardia 03/29/2017   Nonsustained ventricular tachycardia 03/29/2017   Other hyperlipidemia 03/29/2017   Palpitations 10/01/2017   Pleural effusion, bilateral 08/09/2017   Pneumonia due to COVID-19 virus  02/07/2021   Post-nasal drainage 02/25/2016   Last Assessment & Plan:  Trial zyrtec and flonase   Prostate cancer screening 06/20/2017   Last Assessment & Plan:  Recommend continued annual CaP screening until within 10 years of life expectancy. Given good health and fhx of longevity, would anticipate CaP screening to continue until age 76  PSA today and again in one year on day of visit.   Pulmonary arterial hypertension ([place] 02/20/2018   Pulmonary edema    Pulmonary hypertension ([place] 08/09/2017   Pulmonary nodules 06/12/2016   S/P AVR 03/20/2016   S/P AVR (aortic valve replacement) 03/20/2016   Strain of deltoid muscle, initial encounter    Supratherapeutic INR 07/26/2017   Syncope and collapse 02/01/2016   Typical atrial flutter ([place] 02/01/2016    Past Surgical History: Past Surgical History:  Procedure Laterality Date   CHOLECYSTECTOMY     CORONARY ARTERY BYPASS GRAFT     EXPLORATORY LAPAROTOMY  07/30/2017   FOOT SURGERY     FRACTURE SURGERY Right    wrist and forearm   HERNIA REPAIR     MECHANICAL AORTIC VALVE REPLACEMENT     NASAL SINUS SURGERY     RIGHT HEART CATH N/A 01/18/2018   Procedure: RIGHT HEART CATH;  Surgeon: BJolaine Artist MD;  [facility];  Service: Cardiovascular;  Laterality: N/A;   RIGHT HEART CATH N/A 05/14/2018   Procedure: RIGHT HEART CATH;  Surgeon: BJolaine Artist MD;  [facility];  Service: Cardiovascular;  Laterality: N/A;   RIGHT HEART CATH N/A 09/29/2021   Procedure: RIGHT HEART CATH;  Surgeon: BJolaine Artist MD;  [facility];  Service: Cardiovascular;  Laterality: N/A;   TEE WITHOUT CARDIOVERSION N/A 05/21/2018   Procedure: TRANSESOPHAGEAL ECHOCARDIOGRAM (TEE);  Surgeon: BJolaine Artist MD;  [facility];  Service: Cardiovascular;  Laterality: N/A;   UPPER GASTROINTESTINAL ENDOSCOPY  07/12/2017   Patchy areas of mucosal inflammation noted in the antrum with edema,erthema and  ulcerations. Bx. Chronicfocally active gastritis.   VASECTOMY      Family History: Family History  Problem Relation Age of Onset   Asthma Mother    Arthritis Mother    Heart attack Father    Hypertension Father    Stroke Paternal Grandmother    Colon cancer Neg Hx     Social History: Social History   Socioeconomic History   Marital status: Single    Spouse name: Not on file   Number of children: Not on file   Years of education: Not on file   Highest education level: Not on file  Occupational History   Not on file  Tobacco Use   Smoking status: Former    Packs/day: 2.00    Years: 34.00    Pack years: 68.00    Types: Cigarettes    Quit date: 07/16/2000    Years since quitting: 21.5   Smokeless tobacco: Never  Vaping Use   Vaping Use: Never used  Substance and Sexual Activity   Alcohol use: Not Currently   Drug  use: No   Sexual activity: Not on file  Other Topics Concern   Not on file  Social History Narrative   Not on file   Social Determinants of Health   Financial Resource Strain: Low Risk    Difficulty of Paying Living Expenses: Not very hard  Food Insecurity: No Food Insecurity   Worried About Running Out of Food in the Last Year: Never true   Ran Out of Food in the Last Year: Never true  Transportation Needs: No Transportation Needs   Lack of Transportation (Medical): No   Lack of Transportation (Non-Medical): No  Physical Activity: Not on file  Stress: Not on file  Social Connections: Not on file    Allergies:  Allergies  Allergen Reactions   Pantoprazole Sodium Nausea Only    Gets gassy and starting itching like crazy Gets gassy and starting itching like crazy   Valium [Diazepam] Other (See Comments)    HA and Abd pain.   Tape Rash and Other (See Comments)    Surgical tape   Wound Dressing Adhesive Other (See Comments) and Rash    Surgical tape Surgical tape Surgical tape    Objective:    Vital Signs:   Temp:  [98 F (36.7 C)-98.3  F (36.8 C)] 98 F (36.7 C) (01/30 1435) Pulse Rate:  [37-90] 53 (01/30 1600) Resp:  [13-22] 17 (01/30 1600) BP: (75-102)/(55-70) 83/56 (01/30 1600) SpO2:  [100 %] 100 % (01/30 1600)    Weight change: There were no vitals filed for this visit.  Intake/Output:  No intake or output data in the 24 hours ending 01/23/22 1611    Physical Exam    General:  Drowsy. No resp difficulty HEENT: normal Neck: supple. JVP to jaw . Carotids 2+ bilat; no bruits. No lymphadenopathy or thyromegaly appreciated. Cor: PMI nondisplaced. Irregular rate & rhythm. No rubs, gallops or murmurs. Lungs: R and LLL decreased on room air Abdomen: soft, nontender, nondistended. No hepatosplenomegaly. No bruits or masses. Good bowel sounds. Extremities: no cyanosis, clubbing, rash, R and LLE 3+ edema Neuro: alert & orientedx3, cranial nerves grossly intact. moves all 4 extremities w/o difficulty. Affect flat    Telemetry  A fib 40-60s with occasional PVCs.   EKG    Afib 83 bpm personally reviewed   Labs   Basic Metabolic Panel: Recent Labs  Lab 01/23/22 1145  NA 131*  K 3.5  CL 97*  CO2 25  GLUCOSE 97  BUN 11  CREATININE 1.15  CALCIUM 9.1    Liver Function Tests: No results for input(s): AST, ALT, ALKPHOS, BILITOT, PROT, ALBUMIN in the last 168 hours. No results for input(s): LIPASE, AMYLASE in the last 168 hours. No results for input(s): AMMONIA in the last 168 hours.  CBC: Recent Labs  Lab 01/23/22 1145  WBC 4.5  NEUTROABS 4.0  HGB 11.0*  HCT 35.0*  MCV 82.0  PLT 132*    Cardiac Enzymes: No results for input(s): CKTOTAL, CKMB, CKMBINDEX, TROPONINI in the last 168 hours.  BNP: BNP (last 3 results) Recent Labs    09/30/21 0241 01/12/22 1524 01/23/22 1145  BNP 840.7* 1,341.7* 1,831.8*    ProBNP (last 3 results) Recent Labs    07/04/21 1607 08/16/21 1620  PROBNP 425.0* 601.0*     CBG: No results for input(s): GLUCAP in the last 168 hours.  Coagulation  Studies: No results for input(s): LABPROT, INR in the last 72 hours.   Imaging   DG Chest 2 View  Result Date:  01/23/2022 CLINICAL DATA:  A 76 year old male presents for evaluation of shortness of breath. EXAM: CHEST - 2 VIEW COMPARISON:  January 12, 2022. FINDINGS: Signs of median sternotomy for aortic valve replacement. Cardiomediastinal contours are stable. Increased interstitial markings throughout the chest. Blunting of the RIGHT costodiaphragmatic sulcus and to a lesser extent small effusion also on the LEFT. Bibasilar airspace disease. Fullness of central pulmonary vasculature. On limited assessment there is no acute skeletal process. IMPRESSION: 1. Findings suggest pulmonary edema with bilateral effusions, greater on the RIGHT. 2. Bibasilar airspace disease, more likely atelectasis in the current context. Correlate with signs of heart failure. Electronically Signed   By: Zetta Bills M.D.   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 Also has COPD (quit smoking in 2002 - 1 ppd x 25 years), HL, HTN, and PAH  Presenting with marked volume overload and hypotension. Assessment/Plan   Hypotension  -I dont think this is a sepsis picture.  WBC ok. I wonder if trazadone lowered bp.  -BP typically runs low and he has been on midodrine 15 mg tid.  - SBP is < 90 today but he was just given dose of midodrine.  - Recently treated with antibiotics for CAP. CXR ? Pulmonary edema.  -WBC is not elevated.  - Blood Cx obtained  - Lactic acid ordered  A/C Combined Systolic/Diastolic HF  Echo 54/62 EF 45-50% RV low normal  - Marked volume overload. BNP 1831. CXR with pulmonary edema.  - Will need to start 80  mg IV lasix twice a day -Given 40 meq potassium twice a day.  - Place [employer].   - Renal function ok. Follow BMET daily. - Strict I&Os   PAH  -WHO Group I & II - Auto-immune serologies negative (checked twice). - ? Component of HHT/shunt/AVMs with late bubbles on bubble study.  - Echo with bubble study (05/16/18): EF 55-60% with "late bubbles" , Mechanical AVR stable with trivial perivalvular regurg, Mild Lance, Severe LAE, Severe RV dilation and reduced function. No PFO. Mod TR, PA peak pressure 68 mm Hg - Echo (4/22): EF 50-55%, RV ok, severe RVSP 61.6 mmHg, severe biatrial enlargement, moderate TR, stable AVR, AV mean gradient 11 mmHg - Echo 10/22 EF 45-50 RV low normal  - Ab u/s with cirrhosis but no evidence of portal HTN. -He has been on triple therapy with Macitentan, Adcirca, Uptravi.  - SBP low hold macitentan/adcirca.  - Needs to Uptravi but with hypotension will need to hold   3. Chronic A fib  -No longer on bb due to bradycardia.  -On coumadin. INR 3.7    4. Hyponatremia  -Sodium 131  5. PVCs, frequent  6. CAD  s/p CABG 07/2017 with Dr Roderic Palau in [place] No chest pain   7. Leukocytoclastic Vasculitis -Followed by Rheumatology   8. Cirrhosis US Abdomen RUQ 05/16/18 - s/p cholecystectomy. + hepatic cirrhosis. Mild ascites.  - Liver Doppler 05/16/18 - No hepatic, splenic, or portal venous thrombosis or occlusion. Mild ascites  9. S/P Mechanical AVR On coumadin. INR 3.7   Will need Palliative Care for [place] .     Length of Stay: 0  Darrick Grinder, NP  01/23/2022, 4:11 PM  Advanced Heart Failure Team Pager [phone] (M-F; 7a - 5p)  Please contact [facility] for night-coverage after hours (4p -7a ) and weekends on [email or web address]  Patient seen and examined with the above-signed Advanced Practice Provider and/or Housestaff. I personally reviewed laboratory data, imaging studies and relevant notes. I independently examined the patient and formulated the important aspects of the  plan. I have edited the note to reflect any of my changes or salient points. I have personally discussed the plan with the patient and/or family.  76 y/o old male with end-stage PAH/RV failure with chronic hypotension and hyponatremia. Also has CKD 3b, CAD s/p CABG/AVR.  Over past year multiple admits for volume overload and hyponatremia.   Recent had accidental ingestion of fuel injector cleaner. Also hospitalized at [facility].   Now presents with hypotension and volume overload  General:  Chronically-ill/weak appearing No resp difficulty HEENT: normal Neck: supple. JVP to jaw Carotids 2+ bilat; no bruits. No lymphadenopathy or thryomegaly appreciated. Cor: PMI nondisplaced. irregular rate & rhythm. + mechanical s2 @/6 SEM. Lungs: clear Abdomen: soft, nontender, nondistended. No hepatosplenomegaly. No bruits or masses. Good bowel sounds. Extremities: no cyanosis, clubbing, rash, 3+ edema Neuro: sluggish but orientedx3, cranial nerves grossly intact. moves all 4 extremities w/o difficulty. Affect pleasant  He has end-stage RV failure with volume overload and chronic hypotension.   Will wrap legs. Give midodrine and IV lasix. If decompensates can move to ICU for pressor support with NE.   Hold sildenafil. Continue macitentan and Uptravi.   Recent trajector has been poor. Will need Roy discussions.   Glori Bickers, MD  5:38 PM

## 2022-01-23 NOTE — H&P (Signed)
History and Physical    Lance Hicks AST:[id number] DOB: [birth year] DOA: 01/23/2022  PCP: [name], PA-C (Confirm with patient/family/NH records and if not entered, this has to be entered at [facility] point of entry) Patient coming from: Home  I have personally briefly reviewed patient's old medical records in [place]  Chief Complaint: Shortness of breath, my legs are so heavy now  HPI: Lance Hicks is a 76 y.o. 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  Patient was recently hospitalized in [facility] for pneumonia and accidental ingestion of fever injection fluid.  Patient was treated with antibiotics and Lasix was held for 3 days for " low blood pressure".  And patient was discharged on Saturday, and patient started noticed increasing swelling of bilateral legs, he resumed his Lasix for the last 3 days however with no improvement.  He also developed exertional dyspnea, and his friend visited him today and found him " very winded" and sent him to the hospital.  Denies any cough no fever chills.  ED Course: Blood pressure on the lower side, initially patient was found to have hypoxia, stabilized on 3 L oxygen, chest x-ray showed bilateral pulmonary congestion.  Cardiology consulted and 80 mg of Lasix given and patient oxygenation improved.  Review of Systems: As per HPI otherwise 14 point review of systems negative.    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 In light of concurrent left nephrolithiasis, will check CT  renal colic next year instead of renal US.    Anticoagulated on Coumadin 01/04/2018   Anxiety 05/10/2016   Last Assessment & Plan:  Doing well off of zoloft.   Asthma    Asymptomatic microscopic hematuria 06/20/2017   Last Assessment & Plan:  Had hematuria workup in [facility] in 2016 which negative CT and cystoscopy. UA with 2+ blood last visit - we discussed recommendation for repeat workup at 5 years or if degree of hematuria progresses.    Atrial fibrillation ([place]) 03/29/2017   Atrial flutter ([place]) 03/29/2017   Backache 12/14/2015   Last Assessment & Plan:  Pain management referral for further evaluation.   Bilateral pleural effusion 08/09/2017   CHF (congestive heart failure) (HCC)    Chronic allergic rhinitis 04/28/2016   Last Assessment & Plan:  Continue astelin   Chronic anticoagulation 03/29/2017   Chronic atrial fibrillation ([place]) 07/23/2015   Last Assessment & Plan:  Coumadin and metoprolol, cardiology referral to establish care.   Chronic midline back pain 12/14/2015   Last Assessment & Plan:  Pain management referral for further evaluation.   Chronic obstructive lung disease ([place]) 06/12/2016   With hypoxia   Chronic prostatitis 07/23/2015   Last Assessment & Plan:  Has largely resolved since stopping bike riding. Recommend annual DRE AND PSA - will see back 12/2015 for annual screening, given 1st degree fhx. To call office for recurrent prostatitis symptoms.    Chronic respiratory failure with hypoxia (HCC) 07/29/2019   Cirrhosis of liver not due to alcohol ([place]) 07/01/2019   COPD (chronic obstructive pulmonary disease) ([place]) 06/12/2016  Coronary arteriosclerosis in native artery 03/29/2017   Coronary artery disease involving native coronary artery of native heart with angina pectoris ([place]) 03/29/2017   Cough 10/17/2016   Last Assessment & Plan:  Discussed typical course for acute viral illness. If symptoms worsen or fail to improve by 7-10d, delayed ATBs, fluids, rest, NSAIDs/APAP prn. Seek care if not  improving. Needs earlier INR check due to ATBs.   Dyspnea 02/01/2016   Last Assessment & Plan:  Overall improving, eval by pulm, plan for CT, neg stress test with cardiology. Recent switch to carvedilol due to side effects.   Encounter for therapeutic drug monitoring 01/06/2019   Enterococcal bacteremia    Epidermoid cyst of skin 08/24/2017   Essential hypertension 12/14/2015   Last Assessment & Plan:  Hypertension control: controlled  Medications: compliant Medication Management: as noted in orders Home blood pressure monitoring recommended additionally as needed for symptoms  The patient's care plan was reviewed and updated. Instructions and counseling were provided regarding patient goals and barriers. He was counseled to adopt a healthy lifestyle. Educational resources and self-management tools have been provided as charted in [facility] list.    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 Will check CT renal colic next year to assess both stone burden as well as to surveil AML.    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 UA with 2+ blood last visit - we discussed recommendation for repeat workup at 5 years or if degree of hematuria progresses.    Multiple nodules of lung 06/12/2016   Nasal discharge 02/25/2016   Last Assessment & Plan:  Trial zyrtec and flonase   Nephrolithiasis 07/23/2015   Overview:  x 3  Last Assessment & Plan:  By [place] has left nephrolithiasis, but not visible by KUB. Will check CT renal colic next year to assess both stone burden as well as to surveil AML.   Overview:  x 3  Last Assessment & Plan:  Has 55m nonobstructing LUP stone - not visible by KUB.  Will check renal [place] - he will contact office sooner if symptomatic.    Non-sustained ventricular tachycardia 03/29/2017   Nonsustained ventricular tachycardia 03/29/2017   Other hyperlipidemia 03/29/2017   Palpitations 10/01/2017   Pleural effusion, bilateral 08/09/2017   Pneumonia due to COVID-19 virus 02/07/2021   Post-nasal drainage 02/25/2016   Last Assessment & Plan:  Trial zyrtec and flonase   Prostate cancer screening 06/20/2017   Last Assessment & Plan:  Recommend continued annual CaP  screening until within 10 years of life expectancy. Given good health and fhx of longevity, would anticipate CaP screening to continue until age 18.  PSA today and again in one year on day of visit.   Pulmonary arterial hypertension ([place]) 02/20/2018   Pulmonary edema    Pulmonary hypertension ([place]) 08/09/2017   Pulmonary nodules 06/12/2016   S/P AVR 03/20/2016   S/P AVR (aortic valve replacement) 03/20/2016   Strain of deltoid muscle, initial encounter    Supratherapeutic INR 07/26/2017   Syncope and collapse 02/01/2016   Typical atrial flutter ([place]) 02/01/2016    Past Surgical History:  Procedure Laterality Date   CHOLECYSTECTOMY     CORONARY ARTERY BYPASS GRAFT     EXPLORATORY LAPAROTOMY  07/30/2017   FOOT SURGERY     FRACTURE SURGERY Right    wrist and  forearm   HERNIA REPAIR     MECHANICAL AORTIC VALVE REPLACEMENT     NASAL SINUS SURGERY     RIGHT HEART CATH N/A 01/18/2018   Procedure: RIGHT HEART CATH;  Surgeon: Jolaine Artist, MD;  Location: [place] [facility];  Service: Cardiovascular;  Laterality: N/A;   RIGHT HEART CATH N/A 05/14/2018   Procedure: RIGHT HEART CATH;  Surgeon: Jolaine Artist, MD;  Location: [place] [facility];  Service: Cardiovascular;  Laterality: N/A;   RIGHT HEART CATH N/A 09/29/2021   Procedure: RIGHT HEART CATH;  Surgeon: Jolaine Artist, MD;  Location: [place] [facility];  Service: Cardiovascular;  Laterality: N/A;   TEE WITHOUT CARDIOVERSION N/A 05/21/2018   Procedure: TRANSESOPHAGEAL ECHOCARDIOGRAM (TEE);  Surgeon: Jolaine Artist, MD;  Location: [facility] ENDOSCOPY;  Service: Cardiovascular;  Laterality: N/A;   UPPER GASTROINTESTINAL ENDOSCOPY  07/12/2017   Patchy areas of mucosal inflammation noted in the antrum with edema,erthema and ulcerations. Bx. Chronicfocally active gastritis.   VASECTOMY       reports that he quit smoking about 21 years ago. His smoking use included cigarettes. He has a 68.00 pack-year smoking history. He has never used smokeless tobacco. He reports that he does not currently use alcohol. He reports that he does not use drugs.  Allergies  Allergen Reactions   Pantoprazole Sodium Nausea Only    Gets gassy and starting itching like crazy Gets gassy and starting itching like crazy   Valium [Diazepam] Other (See Comments)    HA and Abd pain.   Tape Rash and Other (See Comments)    Surgical tape   Wound Dressing Adhesive Other (See Comments) and Rash    Surgical tape Surgical tape Surgical tape    Family History  Problem Relation Age of Onset   Asthma Mother    Arthritis Mother    Heart attack Father    Hypertension Father    Stroke Paternal Grandmother    Colon cancer Neg Hx      Prior to Admission medications   Medication Sig Start Date End Date Taking?  Authorizing Provider  acetaminophen (TYLENOL) 500 MG tablet Take 500-1,000 mg by mouth every 6 (six) hours as needed (pain.).    [provider]  albuterol (VENTOLIN HFA) 108 (90 Base) MCG/ACT inhaler Inhale 2 puffs into the lungs every 6 (six) hours as needed for wheezing or shortness of breath. Patient not taking: Reported on 12/20/2021 08/04/21   Collene Gobble, MD  ammonium lactate (LAC-HYDRIN) 12 % lotion Apply 1 application topically as needed for dry skin.    [provider]  amoxicillin-clavulanate (AUGMENTIN) 875-125 MG tablet Take  1 tablet by mouth 2 (two) times daily. 01/03/22   Inda Coke, PA  Ascorbic Acid (VITAMIN C GUMMIE PO) Take 2 tablets by mouth daily at 12 noon.    [provider]  aspirin EC 81 MG tablet Take 81 mg by mouth in the morning.    [provider]  Cholecalciferol (VITAMIN D3) 50 MCG (2000 UT) TABS Take 2,000 Units by mouth daily at 12 noon.    [provider]  Coenzyme Q10-Vitamin E (QUNOL ULTRA COQ10 PO) Take 1 capsule by mouth in the morning.    [provider]  dapagliflozin propanediol (FARXIGA) 10 MG TABS tablet Take 1 tablet (10 mg total) by mouth daily before breakfast. 10/14/21   Rafael Bihari, FNP  famotidine (PEPCID) 20 MG tablet TAKE 1 TABLET(20 MG) BY MOUTH DAILY 12/29/21   Saguier, Percell Miller, PA-C  ferrous sulfate 325 (65 FE) MG EC tablet Take 325 mg by mouth every other day.    [provider]  KRILL OIL PO Take 800 mg by mouth every other day. In the morning    [provider]  Magnesium 200 MG CHEW Chew 400 mg by mouth daily at 12 noon.    [provider]  metolazone (ZAROXOLYN) 2.5 MG tablet Take 1 tablet (2.5 mg total) by mouth as needed (for fluid). 11/09/21   Lyda Jester M, PA-C  midodrine (PROAMATINE) 5 MG tablet Take 3 tablets (15 mg total) by mouth 3 (three) times daily with meals. 12/09/21   Bensimhon, Shaune Pascal, MD  ondansetron (ZOFRAN) 8 MG tablet  Take 1 tablet (8 mg total) by mouth every 8 (eight) hours as needed for nausea or vomiting. 12/20/21   Marrian Salvage, FNP  ondansetron (ZOFRAN) 8 MG tablet Take 1 tablet (8 mg total) by mouth every 8 (eight) hours as needed for nausea or vomiting. 12/29/21   Saguier, Percell Miller, PA-C  OPSUMIT 10 MG tablet TAKE 1 TABLET DAILY 09/26/21   Bensimhon, Shaune Pascal, MD  potassium chloride SA (KLOR-CON M) 20 MEQ tablet Take 3 tablets (60 mEq total) by mouth 2 (two) times daily. 12/09/21   Bensimhon, Shaune Pascal, MD  RABEprazole (ACIPHEX) 20 MG tablet Take 1 tablet (20 mg total) by mouth daily. 11/21/21 05/20/22  Jackquline Denmark, MD  rosuvastatin (CRESTOR) 5 MG tablet TAKE 1 TABLET(5 MG) BY MOUTH DAILY 10/05/21   Bensimhon, Shaune Pascal, MD  spironolactone (ALDACTONE) 25 MG tablet Take 1 tablet (25 mg total) by mouth daily. 11/02/21   Bensimhon, Shaune Pascal, MD  tadalafil, PAH, (ADCIRCA) 20 MG tablet Take 40 mg by mouth in the morning.    [provider]  tiZANidine (ZANAFLEX) 2 MG tablet Take 1 tablet (2 mg total) by mouth 3 (three) times daily. 12/15/21 12/15/22  Pete Pelt, PA-C  torsemide (DEMADEX) 20 MG tablet Take 2 tablets (40 mg total) by mouth 2 (two) times daily. 11/09/21   Lyda Jester M, PA-C  Turmeric 500 MG TABS Take 1 capsule by mouth daily in the afternoon. With Ginger 50 mg    [provider]  UPTRAVI 400 MCG TABS TAKE 1 TABLET (400 MCG) EVERY MORNING. 11/22/21   Bensimhon, Shaune Pascal, MD  vitamin B-12 (CYANOCOBALAMIN) 1000 MCG tablet Take 1,000 mcg by mouth daily at 12 noon.    [provider]  warfarin (COUMADIN) 5 MG tablet TAKE 1/2 DAILY DAILY AS DIRECTED BY COUMADIN CLINIC Patient taking differently: TAKE 1/2 tab TTS and 1 tab MWF  AS DIRECTED BY COUMADIN CLINIC  10/05/21   Clegg, Amy D, NP  Zinc 50 MG TABS Take 50 mg by mouth every other day. In the afternoon    [provider]    Physical Exam: Vitals:   01/23/22 1515 01/23/22 1530 01/23/22 1545  01/23/22 1600  BP: (!) 77/60 (!) 75/55 (!) 82/59 (!) 83/56  Pulse: (!) 38 (!) 37 (!) 47 (!) 53  Resp: _0 Temp:      TempSrc:      SpO2: 100% 100% 100% 100%    Constitutional: NAD, calm, comfortable Vitals:   01/23/22 1515 01/23/22 1530 01/23/22 1545 01/23/22 1600  BP: (!) 77/60 (!) 75/55 (!) 82/59 (!) 83/56  Pulse: (!) 38 (!) 37 (!) 47 (!) 53  Resp: _1 Temp:      TempSrc:      SpO2: 100% 100% 100% 100%   Eyes: PERRL, lids and conjunctivae normal ENMT: Mucous membranes are moist. Posterior pharynx clear of any exudate or lesions.Normal dentition.  Neck: normal, supple, no masses, no thyromegaly Respiratory: clear to auscultation bilaterally, no wheezing, fine crackles on bilateral lung bases.  Increasing respiratory effort. No accessory muscle use.  Cardiovascular: Regular rate and rhythm, no murmurs / rubs / gallops. 2+ extremity edema. 2+ pedal pulses. No carotid bruits.  Abdomen: no tenderness, no masses palpated. No hepatosplenomegaly. Bowel sounds positive.  Musculoskeletal: no clubbing / cyanosis. No joint deformity upper and lower extremities. Good ROM, no contractures. Normal muscle tone.  Skin: no rashes, lesions, ulcers. No induration Neurologic: CN 2-12 grossly intact. Sensation intact, DTR normal. Strength 5/5 in all 4.  Psychiatric: Normal judgment and insight. Alert and oriented x 3. Normal mood.     Labs on Admission: I have personally reviewed following labs and imaging studies  CBC: Recent Labs  Lab 01/23/22 1145  WBC 4.5  NEUTROABS 4.0  HGB 11.0*  HCT 35.0*  MCV 82.0  PLT 149*   Basic Metabolic Panel: Recent Labs  Lab 01/23/22 1145  NA 131*  K 3.5  CL 97*  CO2 25  GLUCOSE 97  BUN 11  CREATININE 1.15  CALCIUM 9.1   GFR: Estimated Creatinine Clearance: 52.8 mL/min (by C-G formula based on SCr of 1.15 mg/dL). Liver Function Tests: No results for input(s): AST, ALT, ALKPHOS, BILITOT, PROT, ALBUMIN in the last 168 hours. No  results for input(s): LIPASE, AMYLASE in the last 168 hours. No results for input(s): AMMONIA in the last 168 hours. Coagulation Profile: Recent Labs  Lab 01/23/22 1510  INR 3.7*   Cardiac Enzymes: No results for input(s): CKTOTAL, CKMB, CKMBINDEX, TROPONINI in the last 168 hours. BNP (last 3 results) Recent Labs    07/04/21 1607 08/16/21 1620  PROBNP 425.0* 601.0*   HbA1C: No results for input(s): HGBA1C in the last 72 hours. CBG: No results for input(s): GLUCAP in the last 168 hours. Lipid Profile: No results for input(s): CHOL, HDL, LDLCALC, TRIG, CHOLHDL, LDLDIRECT in the last 72 hours. Thyroid Function Tests: No results for input(s): TSH, T4TOTAL, FREET4, T3FREE, THYROIDAB in the last 72 hours. Anemia Panel: No results for input(s): VITAMINB12, FOLATE, FERRITIN, TIBC, IRON, RETICCTPCT in the last 72 hours. Urine analysis:    Component Value Date/Time   COLORURINE YELLOW 01/27/2021 1756   APPEARANCEUR CLEAR 01/27/2021 1756   LABSPEC 1.006 01/27/2021 1756   PHURINE 7.0 01/27/2021 1756   GLUCOSEU NEGATIVE 01/27/2021 1756   HGBUR NEGATIVE 01/27/2021 1756   BILIRUBINUR negative 06/17/2021 [place] 01/27/2021  1756   PROTEINUR Positive (A) 06/17/2021 1521   PROTEINUR NEGATIVE 01/27/2021 1756   UROBILINOGEN 0.2 06/17/2021 1521   NITRITE negative 06/17/2021 1521   NITRITE NEGATIVE 01/27/2021 1756   LEUKOCYTESUR Negative 06/17/2021 1521   LEUKOCYTESUR NEGATIVE 01/27/2021 1756    Radiological Exams on Admission: DG Chest 2 View  Result Date: 01/23/2022 CLINICAL DATA:  A 76 year old male presents for evaluation of shortness of breath. EXAM: CHEST - 2 VIEW COMPARISON:  January 12, 2022. FINDINGS: Signs of median sternotomy for aortic valve replacement. Cardiomediastinal contours are stable. Increased interstitial markings throughout the chest. Blunting of the RIGHT costodiaphragmatic sulcus and to a lesser extent small effusion also on the LEFT. Bibasilar  airspace disease. Fullness of central pulmonary vasculature. On limited assessment there is no acute skeletal process. IMPRESSION: 1. Findings suggest pulmonary edema with bilateral effusions, greater on the RIGHT. 2. Bibasilar airspace disease, more likely atelectasis in the current context. Correlate with signs of heart failure. Electronically Signed   By: Zetta Bills M.D.   On: 01/23/2022 13:01    EKG: Independently reviewed.  A. fib, no acute ST changes, prolonged QTC  Assessment/Plan Principal Problem:   Acute systolic CHF (congestive heart failure) (HCC) Active Problems:   Acute on chronic diastolic congestive heart failure, NYHA class 3 (HCC)  (please populate well all problems here in Problem List. (For example, if patient is on BP meds at home and you resume or decide to hold them, it is a problem that needs to be her. Same for CAD, COPD, HLD and so on)  Acute on chronic systolic CHF decompensation, NYHA class III -Cardiology started IV Lasix 80 mg daily. -Echo was done 3 months ago, will not repeat this time. -Continue midodrine. -Agreed with cardiology to hold some of the BP and pulmonary hypertension medications to allow IV diuresis. -PT evaluation. -Other DDx, low suspicion for DVT patient is on Coumadin and INR therapeutic.  Hyponatremia -Dilutional secondary to CHF decompensation  Hypotension, orthostatic, history of -No mentation changes, blood pressure measurement appears to be at baseline, continue midodrine.  Alcohol abuse -No symptoms or signs of active withdrawal, CIWA protocol with as needed benzos.  IIDM -Sliding scale for now  Liver cirrhosis -LFT pending this time, appears to have stable and normal albumin level -Elevated INR likely from Coumadin but not coagulopathy.  Pulmonary hypertension -As mentioned above, will hold off Uptravi and Opsumit -Continue sildenafil  Status post mechanical aortic valve replacement -Consult pharmacy for redosing  Coumadin  Chronic A. Fib -Rate controlled, on Coumadin.   DVT prophylaxis: Coumadin Code Status: Full code Family Communication: Neighbor/friend at bedside Disposition Plan: Expect more than 2 midnight hospital stay to allow aggressive diuresis. Consults called: Cardiology Admission status: Telemetry admission   Lequita Halt MD [facility] Pager [phone]  01/23/2022, 6:24 PM

## 2022-01-23 NOTE — ED Provider Triage Note (Signed)
Emergency Medicine Provider Triage Evaluation Note  Lance Hicks , a 76 y.o. male  was evaluated in triage.  Pt complains of shob. Drank fuel injector clearer accidentally Sunday a week ago. Went to [place] ER Tuesday for vomiting, admitted for PNA. Went home on O2 at 5L [place] on Saturday (not normally on supplemental). Completed Levaquin yesterday. Managed by Dr. Linna Hoff for pulmonary arterial hypertension. Came here today because of history PAH. Review of Systems  Positive: SHOB Negative: Vomiting, fevers  Physical Exam  BP 102/66 (BP Location: Left Arm)    Pulse 90    Temp 98.3 F (36.8 C) (Oral)    Resp 16    SpO2 100%  Gen:   Awake, no distress   Resp:  Normal effort  MSK:   Moves extremities without difficulty  Other:  Lower ext swelling  Medical Decision Making  Medically screening exam initiated at 11:32 AM.  Appropriate orders placed.  Lance Hicks was informed that the remainder of the evaluation will be completed by another provider, this initial triage assessment does not replace that evaluation, and the importance of remaining in the ED until their evaluation is complete.     Lance Learn, PA-C 01/23/22 1137

## 2022-01-24 ENCOUNTER — Inpatient Hospital Stay: Payer: Self-pay

## 2022-01-24 DIAGNOSIS — I5021 Acute systolic (congestive) heart failure: Secondary | ICD-10-CM | POA: Diagnosis not present

## 2022-01-24 LAB — PROTIME-INR
INR: 4.1 (ref 0.8–1.2)
Prothrombin Time: 40 seconds — ABNORMAL HIGH (ref 11.4–15.2)

## 2022-01-24 LAB — CBC
HCT: 35.2 % — ABNORMAL LOW (ref 39.0–52.0)
Hemoglobin: 11.3 g/dL — ABNORMAL LOW (ref 13.0–17.0)
MCH: 26.3 pg (ref 26.0–34.0)
MCHC: 32.1 g/dL (ref 30.0–36.0)
MCV: 81.9 fL (ref 80.0–100.0)
Platelets: 121 10*3/uL — ABNORMAL LOW (ref 150–400)
RBC: 4.3 MIL/uL (ref 4.22–5.81)
RDW: 21.2 % — ABNORMAL HIGH (ref 11.5–15.5)
WBC: 5.2 10*3/uL (ref 4.0–10.5)
nRBC: 0 % (ref 0.0–0.2)

## 2022-01-24 LAB — BASIC METABOLIC PANEL
Anion gap: 10 (ref 5–15)
Anion gap: 11 (ref 5–15)
BUN: 12 mg/dL (ref 8–23)
BUN: 15 mg/dL (ref 8–23)
CO2: 23 mmol/L (ref 22–32)
CO2: 24 mmol/L (ref 22–32)
Calcium: 9.1 mg/dL (ref 8.9–10.3)
Calcium: 9.7 mg/dL (ref 8.9–10.3)
Chloride: 97 mmol/L — ABNORMAL LOW (ref 98–111)
Chloride: 96 mmol/L — ABNORMAL LOW (ref 98–111)
Creatinine, Ser: 1.05 mg/dL (ref 0.61–1.24)
Creatinine, Ser: 1.42 mg/dL — ABNORMAL HIGH (ref 0.61–1.24)
GFR, Estimated: >60 mL/min (ref >60)
GFR, Estimated: 52 mL/min — ABNORMAL LOW (ref >60)
Glucose, Bld: 88 mg/dL (ref 70–99)
Glucose, Bld: 102 mg/dL — ABNORMAL HIGH (ref 70–99)
Potassium: 5 mmol/L (ref 3.5–5.1)
Potassium: 5 mmol/L (ref 3.5–5.1)
Sodium: 130 mmol/L — ABNORMAL LOW (ref 135–145)
Sodium: 131 mmol/L — ABNORMAL LOW (ref 135–145)

## 2022-01-24 LAB — HEPATIC FUNCTION PANEL
ALT: 12 U/L (ref 0–44)
AST: 27 U/L (ref 15–41)
Albumin: 2.7 g/dL — ABNORMAL LOW (ref 3.5–5.0)
Alkaline Phosphatase: 54 U/L (ref 38–126)
Bilirubin, Direct: 0.8 mg/dL — ABNORMAL HIGH (ref 0.0–0.2)
Indirect Bilirubin: 1.2 mg/dL — ABNORMAL HIGH (ref 0.3–0.9)
Total Bilirubin: 2 mg/dL — ABNORMAL HIGH (ref 0.3–1.2)
Total Protein: 6.1 g/dL — ABNORMAL LOW (ref 6.5–8.1)

## 2022-01-24 LAB — HEMOGLOBIN A1C
Hgb A1c MFr Bld: 5.5 % (ref 4.8–5.6)
Mean Plasma Glucose: 111.15 mg/dL

## 2022-01-24 LAB — GLUCOSE, CAPILLARY
Glucose-Capillary: 105 mg/dL — ABNORMAL HIGH (ref 70–99)
Glucose-Capillary: 145 mg/dL — ABNORMAL HIGH (ref 70–99)

## 2022-01-24 LAB — CBG MONITORING, ED
Glucose-Capillary: 77 mg/dL (ref 70–99)
Glucose-Capillary: 77 mg/dL (ref 70–99)
Glucose-Capillary: 101 mg/dL — ABNORMAL HIGH (ref 70–99)

## 2022-01-24 MED ORDER — MILRINONE LACTATE IN DEXTROSE 20-5 MG/100ML-% IV SOLN
0.2500 ug/kg/min | INTRAVENOUS | Status: DC
  Administered 2022-01-24: 0.25 ug/kg/min via INTRAVENOUS
  Filled 2022-01-24 (×2): qty 100

## 2022-01-24 MED ORDER — POLYETHYLENE GLYCOL 3350 17 G PO PACK
17.0000 g | PACK | Freq: Every day | ORAL | Status: DC
  Administered 2022-01-24 – 2022-02-02 (×8): 17 g via ORAL
  Filled 2022-01-24 (×12): qty 1

## 2022-01-24 MED ORDER — MELATONIN 3 MG PO TABS
3.0000 mg | ORAL_TABLET | Freq: Every day | ORAL | Status: DC
  Administered 2022-01-24 (×2): 3 mg via ORAL
  Filled 2022-01-24 (×2): qty 1

## 2022-01-24 MED ORDER — SIMETHICONE 40 MG/0.6ML PO SUSP
40.0000 mg | Freq: Four times a day (QID) | ORAL | Status: DC | PRN
  Administered 2022-01-24: 40 mg via ORAL
  Filled 2022-01-24: qty 0.6

## 2022-01-24 MED ORDER — FUROSEMIDE 10 MG/ML IJ SOLN
10.0000 mg/h | INTRAVENOUS | Status: DC
  Administered 2022-01-24: 20 mg/h via INTRAVENOUS
  Administered 2022-01-25 – 2022-01-26 (×2): 10 mg/h via INTRAVENOUS
  Filled 2022-01-24 (×6): qty 20

## 2022-01-24 MED ORDER — DOCUSATE SODIUM 100 MG PO CAPS
200.0000 mg | ORAL_CAPSULE | Freq: Every day | ORAL | Status: DC
  Administered 2022-01-24 – 2022-01-26 (×2): 200 mg via ORAL
  Filled 2022-01-24 (×3): qty 2

## 2022-01-24 MED ORDER — METOLAZONE 5 MG PO TABS
5.0000 mg | ORAL_TABLET | Freq: Once | ORAL | Status: AC
  Administered 2022-01-24: 5 mg via ORAL
  Filled 2022-01-24: qty 1

## 2022-01-24 MED ORDER — POTASSIUM CHLORIDE 20 MEQ PO PACK
40.0000 meq | PACK | Freq: Two times a day (BID) | ORAL | Status: DC
  Administered 2022-01-24 – 2022-01-27 (×6): 40 meq via ORAL
  Filled 2022-01-24 (×7): qty 2

## 2022-01-24 MED ORDER — SORBITOL 70 % SOLN
30.0000 mL | Freq: Once | Status: AC
  Administered 2022-01-24: 30 mL via ORAL
  Filled 2022-01-24: qty 30

## 2022-01-24 MED ORDER — FUROSEMIDE 10 MG/ML IJ SOLN
80.0000 mg | Freq: Once | INTRAMUSCULAR | Status: AC
  Administered 2022-01-24: 80 mg via INTRAVENOUS
  Filled 2022-01-24: qty 8

## 2022-01-24 NOTE — ED Notes (Signed)
Pt given warm blankets.

## 2022-01-24 NOTE — Evaluation (Signed)
Physical Therapy Evaluation Patient Details Name: Lance Hicks MRN: [id number] DOB: 03-22-1946 Today's Date: 01/24/2022  History of Present Illness  76 yo male with onset of LE edema and SOB with recent PNA in hosp in [place] was admitted at [facility] 1/30.  Had recent admit for fuel injector fluid injestion, after which he returned for worsening edema LE"s.  PMHx:  CABG, metal AVR, hypotension, PNA, HTN, CAD, aortic stenosis, DM, cirrhosis, EtOH abuse, pulm HTN, hyponatremia, a-fib  Clinical Impression  Pt was seen after an admission to get moving on side of bed, to be able to walk and go home if possible. Pt is weak and desaturating, dropping to 84% at times on monitor with sitting and standing.  Pt is recommended to SNF due to his inability to walk alone, generalized low balance skill, poor motor planning skills and inability to see how he can correct his movement.  Pt is NOT in agreement with SNF and will likely refuse despite his safety issues.  Talked with him about thinking about how he can coordinate help for the opportunity to go home.       Recommendations for follow up therapy are one component of a multi-disciplinary discharge planning process, led by the attending physician.  Recommendations may be updated based on patient status, additional functional criteria and insurance authorization.  Follow Up Recommendations Skilled nursing-short term rehab (<3 hours/day)    Assistance Recommended at Discharge Intermittent Supervision/Assistance  Patient can return home with the following  A little help with walking and/or transfers;A little help with bathing/dressing/bathroom;Assistance with cooking/housework;Assist for transportation;Help with stairs or ramp for entrance    Equipment Recommendations None recommended by PT  Recommendations for Other Services       Functional Status Assessment Patient has had a recent decline in their functional status and demonstrates the ability to make  significant improvements in function in a reasonable and predictable amount of time.     Precautions / Restrictions Precautions Precautions: Fall Precaution Comments: monitor O2 sats Restrictions Weight Bearing Restrictions: No      Mobility  Bed Mobility Overal bed mobility: Needs Assistance Bed Mobility: Sidelying to Sit, Sit to Sidelying   Sidelying to sit: Min guard, Min assist     Sit to sidelying: Min assist General bed mobility comments: min assist to get legs on and off mat    Transfers Overall transfer level: Needs assistance Equipment used: 1 person hand held assist Transfers: Sit to/from Stand Sit to Stand: Min guard           General transfer comment: min guard to stand up with steadying of HHA    Ambulation/Gait Ambulation/Gait assistance: Min guard Gait Distance (Feet): 8 Feet Assistive device: 1 person hand held assist Gait Pattern/deviations: Step-to pattern, Decreased stride length, Wide base of support Gait velocity: reduced Gait velocity interpretation: <1.31 ft/sec, indicative of household ambulator   General Gait Details: sidesteps on side of bed with HHA and help with lines  Stairs            Wheelchair Mobility    Modified Rankin (Stroke Patients Only)       Balance Overall balance assessment: Needs assistance Sitting-balance support: Feet supported Sitting balance-Leahy Scale: Fair     Standing balance support: Bilateral upper extremity supported, During functional activity Standing balance-Leahy Scale: Poor  Pertinent Vitals/Pain Pain Assessment Pain Assessment: No/denies pain    Home Living Family/patient expects to be discharged to:: Private residence Living Arrangements: Alone Available Help at Discharge: Friend(s);Available PRN/intermittently Type of Home: House Home Access: Stairs to enter Entrance Stairs-Rails: Can reach both Entrance Stairs-Number of Steps: 1    Home Layout: One level Home Equipment: Conservation officer, nature (2 wheels);Grab bars - tub/shower;Toilet riser      Prior Function Prior Level of Function : Needs assist;Independent/Modified Independent             Mobility Comments: requires help with his animals and housework normally       Hand Dominance   Dominant Hand: Right    Extremity/Trunk Assessment   Upper Extremity Assessment Upper Extremity Assessment: Overall WFL for tasks assessed    Lower Extremity Assessment Lower Extremity Assessment: Overall WFL for tasks assessed    Cervical / Trunk Assessment Cervical / Trunk Assessment: Kyphotic  Communication   Communication: No difficulties  Cognition Arousal/Alertness: Awake/alert Behavior During Therapy: Anxious Overall Cognitive Status: No family/caregiver present to determine baseline cognitive functioning                                 General Comments: difficult to tell due to irritability if pt is cogntively different        General Comments General comments (skin integrity, edema, etc.): pt is up to walk with minor help and is willing to work but cannot get him to agree to rehab stay to manage his instability standing.    Exercises     Assessment/Plan    PT Assessment Patient needs continued PT services  PT Problem List Decreased strength;Decreased range of motion;Decreased activity tolerance;Decreased balance;Decreased mobility;Decreased coordination;Decreased cognition;Decreased knowledge of use of DME;Decreased safety awareness;Decreased skin integrity       PT Treatment Interventions DME instruction;Gait training;Stair training;Therapeutic activities;Functional mobility training;Therapeutic exercise;Balance training;Neuromuscular re-education;Patient/family education    PT Goals (Current goals can be found in the Care Plan section)  Acute Rehab PT Goals Patient Stated Goal: to walk and go home again, NO REHAB PT Goal Formulation:  With patient Time For Goal Achievement: 02/07/22 Potential to Achieve Goals: Good    Frequency Min 3X/week     Co-evaluation               AM-PAC PT "6 Clicks" Mobility  Outcome Measure Help needed turning from your back to your side while in a flat bed without using bedrails?: A Little Help needed moving from lying on your back to sitting on the side of a flat bed without using bedrails?: A Little Help needed moving to and from a bed to a chair (including a wheelchair)?: A Little Help needed standing up from a chair using your arms (e.g., wheelchair or bedside chair)?: A Little Help needed to walk in hospital room?: A Little Help needed climbing 3-5 steps with a railing? : A Little 6 Click Score: 18    End of Session Equipment Utilized During Treatment: Gait belt Activity Tolerance: Patient tolerated treatment well;Patient limited by fatigue Patient left: in bed;with call bell/phone within reach Nurse Communication: Mobility status PT Visit Diagnosis: Unsteadiness on feet (R26.81);Difficulty in walking, not elsewhere classified (R26.2)    Time: 4854-6270 PT Time Calculation (min) (ACUTE ONLY): 33 min   Charges:   PT Evaluation $PT Eval Moderate Complexity: 1 Mod PT Treatments $Therapeutic Activity: 8-22 mins       Alvie Heidelberg  Vernon Ariel 01/24/2022, 5:04 PM  Mee Hives, PT PhD Acute Rehab Dept. Number: [place] [facility] [place]

## 2022-01-24 NOTE — Progress Notes (Signed)
Heart Failure Navigator Progress Note  Assessed for Heart & Vascular TOC clinic readiness.  Patient does not meet criteria due to prior to hospitalization pt established with AHF clinic. AHF rounding team consulted.  Navigator available for educational resources.   Pricilla Holm, MSN, RN Heart Failure Nurse Navigator [phone]

## 2022-01-24 NOTE — Progress Notes (Signed)
ANTICOAGULATION CONSULT NOTE - Follow Up Consult  Pharmacy Consult for Warfarin Indication:  metal AVR  Allergies  Allergen Reactions   Augmentin [Amoxicillin-Pot Clavulanate] Anaphylaxis and Diarrhea    "Upset stomach"   Pantoprazole Sodium Nausea Only    Gets gassy and starting itching like crazy Gets gassy and starting itching like crazy   Valium [Diazepam] Other (See Comments)    HA and Abd pain.   Tape Rash and Other (See Comments)    Surgical tape   Wound Dressing Adhesive Other (See Comments) and Rash    Surgical tape Surgical tape Surgical tape    Vital Signs: BP: 117/85 (01/31 0600) Pulse Rate: 79 (01/31 0600)  Labs: Recent Labs    01/23/22 1145 01/23/22 1510 01/24/22 0347  HGB 11.0*  --  11.3*  HCT 35.0*  --  35.2*  PLT 132*  --  121*  LABPROT  --  36.5* 40.0*  INR  --  3.7* 4.1*  CREATININE 1.15  --  1.05     Estimated Creatinine Clearance: 57.8 mL/min (by C-G formula based on SCr of 1.05 mg/dL).   Medications:  Scheduled:   aspirin EC  81 mg Oral q AM   famotidine  10 mg Oral Daily   ferrous sulfate  325 mg Oral QODAY   folic acid  1 mg Oral Daily   furosemide  80 mg Intravenous BID   insulin aspart  0-9 Units Subcutaneous TID WC   melatonin  3 mg Oral QHS   midodrine  15 mg Oral TID WC   multivitamin with minerals  1 tablet Oral Daily   pantoprazole  40 mg Oral Daily   potassium chloride  60 mEq Oral BID   rosuvastatin  5 mg Oral Daily   Selexipag  200 mcg Oral BID   sodium chloride flush  3 mL Intravenous Q12H   spironolactone  25 mg Oral Daily   thiamine  100 mg Oral Daily   Or   thiamine  100 mg Intravenous Daily   tiZANidine  2 mg Oral TID   Warfarin - Pharmacist Dosing Inpatient   Does not apply q1600    Assessment: 76 y.o. male with medical history significant of chronic systolic CHF NYHA class III, HTN, CAD and aortic stenosis status post CABG and metal AVR on Coumadin, orthostatic hypotension, COPD and pulmonary hypertension,  IIDM, PAF, liver cirrhosis secondary to alcohol abuse, presented with increasing leg swelling.  Home regimen per patient: warfarin 76m MonWed and warfarin 2.521mall other days.   INR supratherapeutic this AM at 4.1  Goal of Therapy:  INR goal 2.5-3.0 (per clinic note) Monitor platelets by anticoagulation protocol: Yes   Plan:  Hold warfarin today Daily INR, s/s bleeding  JoBertis RuddyPharmD Clinical Pharmacist ED Pharmacist Phone # [phone]/31/2023 8:21 AM

## 2022-01-24 NOTE — ED Notes (Signed)
Breakfast orders placed

## 2022-01-24 NOTE — Progress Notes (Addendum)
Patient has order for Coox to be drawn, but patientt has no PICC or no orders for PICC placement, RN paged CHF team but no response, PA Barret for cards to clarify. See new order for picc

## 2022-01-24 NOTE — ED Notes (Addendum)
Sustained PT afib with HR in mid 30's - to upper 40's. EKG shot. MD Chotnier made aware. Cards paged.

## 2022-01-24 NOTE — Progress Notes (Signed)
° °  CO-OX is ordered, unable to get w/ current IV site, PICC line ordered.  Rosaria Ferries, PA-C 01/24/2022 5:55 PM

## 2022-01-24 NOTE — Progress Notes (Addendum)
Advanced Heart Failure Rounding Note  PCP-Cardiologist: None   Subjective:    2L in UOP w/ IV Lasix. C/w marked fluid overload. CXR w/ pulmonary edema and b/l pleural effusions R>L. Continues w/ dyspnea. O2 sats 94% on RA.   SBPs 90s-low 100s.   SCr 1.15>>1.05  K 5.0   Na 130   Albumin 2.7  INR 4.1. AST/ALT normal. Tbili 2.0   Lactic acid 1.6>>pending   Complains of feeling cold. Wrapped up in blankets, but no rigors. AF. WBC normal. Also complains of gas/ abdominal bloating and constipation.   Objective:   Weight Range:   There is no height or weight on file to calculate BMI.   Vital Signs:   Temp:  [98 F (36.7 C)-98.3 F (36.8 C)] 98 F (36.7 C) (01/30 1435) Pulse Rate:  [33-90] 74 (01/31 0845) Resp:  [10-27] 21 (01/31 0845) BP: (75-117)/(55-85) 93/72 (01/31 0845) SpO2:  [85 %-100 %] 99 % (01/31 0845)    Weight change: There were no vitals filed for this visit.  Intake/Output:   Intake/Output Summary (Last 24 hours) at 01/24/2022 0931 Last data filed at 01/23/2022 2322 Gross per 24 hour  Intake --  Output 2000 ml  Net -2000 ml      Physical Exam    General:  chronically ill appearing, thin elderly male, slight increased WOB, Wrapped in blankets HEENT: Normal Neck: Supple. JVP to ear. Carotids 2+ bilat; no bruits. No lymphadenopathy or thyromegaly appreciated. Cor: PMI nondisplaced. Irregularly irregular rhythm and slow rate. + mechanical valve sounds 2/6 SEM  Lungs: decreased BS at the bases, b/l crackles  Abdomen: Soft, nontender, nondistended. No hepatosplenomegaly. No bruits or masses. Good bowel sounds. Extremities: No cyanosis, clubbing, rash, 2+ b/l LE edema up to thighs  Neuro: Alert & orientedx3, cranial nerves grossly intact. moves all 4 extremities w/o difficulty. Affect pleasant   Telemetry   Afib w/ slow ventricular response, upper 40s-50s, several pauses ~2.3 sec duration   EKG    Afib, slow ventricular response 45 bpm    Labs    CBC Recent Labs    01/23/22 1145 01/24/22 0347  WBC 4.5 5.2  NEUTROABS 4.0  --   HGB 11.0* 11.3*  HCT 35.0* 35.2*  MCV 82.0 81.9  PLT 132* 299*   Basic Metabolic Panel Recent Labs    01/23/22 1145 01/24/22 0347  NA 131* 130*  K 3.5 5.0  CL 97* 97*  CO2 25 23  GLUCOSE 97 88  BUN 11 12  CREATININE 1.15 1.05  CALCIUM 9.1 9.1   Liver Function Tests Recent Labs    01/24/22 0347  AST 27  ALT 12  ALKPHOS 54  BILITOT 2.0*  PROT 6.1*  ALBUMIN 2.7*   No results for input(s): LIPASE, AMYLASE in the last 72 hours. Cardiac Enzymes No results for input(s): CKTOTAL, CKMB, CKMBINDEX, TROPONINI in the last 72 hours.  BNP: BNP (last 3 results) Recent Labs    09/30/21 0241 01/12/22 1524 01/23/22 1145  BNP 840.7* 1,341.7* 1,831.8*    ProBNP (last 3 results) Recent Labs    07/04/21 1607 08/16/21 1620  PROBNP 425.0* 601.0*     D-Dimer No results for input(s): DDIMER in the last 72 hours. Hemoglobin A1C Recent Labs    01/24/22 0347  HGBA1C 5.5   Fasting Lipid Panel No results for input(s): CHOL, HDL, LDLCALC, TRIG, CHOLHDL, LDLDIRECT in the last 72 hours. Thyroid Function Tests No results for input(s): TSH, T4TOTAL, T3FREE, THYROIDAB in the last  72 hours.  Invalid input(s): FREET3  Other results:   Imaging    DG Chest 2 View  Result Date: 01/23/2022 CLINICAL DATA:  A 76 year old male presents for evaluation of shortness of breath. EXAM: CHEST - 2 VIEW COMPARISON:  January 12, 2022. FINDINGS: Signs of median sternotomy for aortic valve replacement. Cardiomediastinal contours are stable. Increased interstitial markings throughout the chest. Blunting of the RIGHT costodiaphragmatic sulcus and to a lesser extent small effusion also on the LEFT. Bibasilar airspace disease. Fullness of central pulmonary vasculature. On limited assessment there is no acute skeletal process. IMPRESSION: 1. Findings suggest pulmonary edema with bilateral effusions,  greater on the RIGHT. 2. Bibasilar airspace disease, more likely atelectasis in the current context. Correlate with signs of heart failure. Electronically Signed   By: Zetta Bills M.D.   On: 01/23/2022 13:01     Medications:     Scheduled Medications:  aspirin EC  81 mg Oral q AM   famotidine  10 mg Oral Daily   ferrous sulfate  325 mg Oral QODAY   folic acid  1 mg Oral Daily   furosemide  80 mg Intravenous BID   insulin aspart  0-9 Units Subcutaneous TID WC   melatonin  3 mg Oral QHS   midodrine  15 mg Oral TID WC   multivitamin with minerals  1 tablet Oral Daily   pantoprazole  40 mg Oral Daily   potassium chloride  60 mEq Oral BID   rosuvastatin  5 mg Oral Daily   Selexipag  200 mcg Oral BID   sodium chloride flush  3 mL Intravenous Q12H   spironolactone  25 mg Oral Daily   thiamine  100 mg Oral Daily   Or   thiamine  100 mg Intravenous Daily   tiZANidine  2 mg Oral TID   Warfarin - Pharmacist Dosing Inpatient   Does not apply q1600    Infusions:  sodium chloride      PRN Medications: sodium chloride, acetaminophen, LORazepam **OR** LORazepam, ondansetron (ZOFRAN) IV, ondansetron, sodium chloride flush    Patient Profile   76 y/o old male with end-stage PAH/RV failure with chronic hypotension and hyponatremia. Also has CKD 3b, CAD s/p CABG/AVR.   Over past year multiple admits for volume overload and hyponatremia.    Recent had accidental ingestion of fuel injector cleaner. Also hospitalized at [facility].    Now presents with hypotension and volume overload   Assessment/Plan   1. A/C Biventricular Heart Failure, Predominant RV Failure   - Echo 10/22 EF 45-50% RV low normal  - Marked volume overload. BNP 1831. CXR with pulmonary edema.  - Increase Lasix, will give another dose of 80 IV now + 5 of metolazone and monitor response. If sluggish diuresis, will need RV support w/ milrinone.  - Place [employer].   - Renal function ok. Follow BMET  daily. - Strict I&Os and fluid restrict    2. PAH  -WHO Group I & II - Auto-immune serologies negative (checked twice). - ? Component of HHT/shunt/AVMs with late bubbles on bubble study.  - Echo with bubble study (05/16/18): EF 55-60% with "late bubbles" , Mechanical AVR stable with trivial perivalvular regurg, Mild MR, Severe LAE, Severe RV dilation and reduced function. No PFO. Mod TR, PA peak pressure 68 mm Hg - Echo (4/22): EF 50-55%, RV ok, severe RVSP 61.6 mmHg, severe biatrial enlargement, moderate TR, stable AVR, AV mean gradient 11 mmHg - Echo 10/22 EF 45-50 RV low normal  -  Ab u/s with cirrhosis but no evidence of portal HTN. - Hold sildenafil and Macitentan w/ hypotension. Continue Uptravi.    3. Chronic A fib  -No longer on bb due to bradycardia.  - INR 4.1. hold Coumadin    4. Hypotension  - in setting of RV failure - continue midodrine 15 tid   5.. Hypervolemic Hyponatremia  - Sodium 130 - Diuresis per above, fluid restrict    6. PVCs, frequent  - rare this AM, ~1/min  7.. CAD  - s/p CABG 07/2017 with Dr Roderic Palau in [place] - No chest pain    8. Leukocytoclastic Vasculitis - Followed by Rheumatology    9. Cirrhosis US Abdomen RUQ 05/16/18 - s/p cholecystectomy. + hepatic cirrhosis. Mild ascites.  - Liver Doppler 05/16/18 - No hepatic, splenic, or portal venous thrombosis or occlusion. Mild ascites   9. S/P Mechanical AVR - On coumadin PTA, holding w/ supratherapeutic INR, 4.1    Addendum: Poor response to diuretics. Received total of 160 mg IV Lasix + 5 of metolazone w/ only 150 cc in UOP. SBPs low 100s. D/w Dr. Haroldine Laws. Start milrinone 0.25 mgc/kg/min + Lasix gtt at 20/hr. Has bed on 3E. Awaiting transport.   Length of Stay: 1  Lance Jester, PA-C  01/24/2022, 9:31 AM  Advanced Heart Failure Team Pager [phone] (M-F; 7a - 5p)  Please contact [facility] for night-coverage after hours (5p -7a ) and weekends on [email or web address]  Patient seen and examined  with the above-signed Advanced Practice Provider and/or Housestaff. I personally reviewed laboratory data, imaging studies and relevant notes. I independently examined the patient and formulated the important aspects of the plan. I have edited the note to reflect any of my changes or salient points. I have personally discussed the plan with the patient and/or family.  Remains SOB. Not responding well to IV lasix and metolazone.   General:  Ill appearing. No resp difficulty HEENT: normal Neck: supple. JVP to jaw . Carotids 2+ bilat; no bruits. No lymphadenopathy or thryomegaly appreciated. Cor: PMI nondisplaced. irregular rate & rhythm. 2/6 AS mechanical s2 Lungs: clear Abdomen: soft, nontender, nondistended. No hepatosplenomegaly. No bruits or masses. Good bowel sounds. Extremities: no cyanosis, clubbing, rash, 3+ edema Neuro: alert & orientedx3, cranial nerves grossly intact. moves all 4 extremities w/o difficulty. Affect pleasant  Has significant volume overload in setting of R>>L HF. Not responding to IV lasix/metolazone. Will add empiric milrinone. If no response will need RHC. He is nearing end-stage. Will need to discuss [place] further.   Lance Bickers, MD  6:25 PM

## 2022-01-24 NOTE — Progress Notes (Signed)
Patient has an order for PICC placement and attempted to get  a consent but patient refused to sign consent. He does not want PICC placed.RN aware.RN unit to get order to discontinue PICC.

## 2022-01-24 NOTE — Progress Notes (Signed)
HPI: Lance Hicks is a 76 y.o. 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  Patient was recently hospitalized in [facility] for pneumonia and accidental ingestion of fever injection fluid.  Patient was treated with antibiotics and Lasix was held for 3 days for " low blood pressure".  And patient was discharged on Saturday, and patient started noticed increasing swelling of bilateral legs, he resumed his Lasix for the last 3 days however with no improvement.  He also developed exertional dyspnea, and his friend visited him today and found him " very winded" and sent him to the hospital.  Denies any cough no fever chills.  ED Course: Blood pressure on the lower side, initially patient was found to have hypoxia, stabilized on 3 L oxygen, chest x-ray showed bilateral pulmonary congestion.  Cardiology consulted and 80 mg of Lasix given and patient oxygenation improved.  Subjective Feels a little better   Physical Exam: Vitals:   01/24/22 0830 01/24/22 0845 01/24/22 1000 01/24/22 1100  BP: 113/61 93/72 115/65 105/77  Pulse: 78 74 61 62  Resp: 20 (!) 21 (!) 29 (!) 21  Temp:      TempSrc:      SpO2: 97% 99% 97% 94%    Constitutional: NAD, calm, comfortable Vitals:   01/24/22 0830 01/24/22 0845 01/24/22 1000 01/24/22 1100  BP: 113/61 93/72 115/65 105/77  Pulse: 78 74 61 62  Resp: 20 (!) 21 (!) 29 (!) 21  Temp:      TempSrc:      SpO2: 97% 99% 97% 94%   Eyes: PERRL, lids and conjunctivae normal ENMT: Mucous membranes are moist. Posterior pharynx clear of any exudate or lesions.Normal dentition.  Neck: normal, supple, no masses, no thyromegaly Respiratory: clear to auscultation bilaterally, no wheezing, fine crackles on  bilateral lung bases.  No accessory muscle use.  Cardiovascular: Regular rate and rhythm, no murmurs / rubs / gallops. 2+ extremity edema. 2+ pedal pulses. No carotid bruits.  Abdomen: no tenderness, no masses palpated. No hepatosplenomegaly. Bowel sounds positive.  Musculoskeletal: no clubbing / cyanosis. No joint deformity upper and lower extremities. Good ROM, no contractures. Normal muscle tone.  Skin: no rashes, lesions, ulcers. No induration Neurologic: CN 2-12 grossly intact. Sensation intact, DTR normal. Strength 5/5 in all 4.  Psychiatric: Normal judgment and insight. Alert and oriented x 3. Normal mood.     Labs on Admission: I have personally reviewed following labs and imaging studies  CBC: Recent Labs  Lab 01/23/22 1145 01/24/22 0347  WBC 4.5 5.2  NEUTROABS 4.0  --   HGB 11.0* 11.3*  HCT 35.0* 35.2*  MCV 82.0 81.9  PLT 132* 716*   Basic Metabolic Panel: Recent Labs  Lab 01/23/22 1145 01/24/22 0347  NA 131* 130*  K 3.5 5.0  CL 97* 97*  CO2 25 23  GLUCOSE 97 88  BUN 11 12  CREATININE 1.15 1.05  CALCIUM 9.1 9.1   GFR: Estimated Creatinine Clearance: 57.8 mL/min (by C-G formula based on SCr of 1.05 mg/dL). Liver Function Tests: Recent Labs  Lab 01/24/22 0347  AST 27  ALT 12  ALKPHOS 54  BILITOT 2.0*  PROT 6.1*  ALBUMIN 2.7*   No results for input(s): LIPASE, AMYLASE in the last 168 hours. No results for input(s):  AMMONIA in the last 168 hours. Coagulation Profile: Recent Labs  Lab 01/23/22 1510 01/24/22 0347  INR 3.7* 4.1*   Cardiac Enzymes: No results for input(s): CKTOTAL, CKMB, CKMBINDEX, TROPONINI in the last 168 hours. BNP (last 3 results) Recent Labs    07/04/21 1607 08/16/21 1620  PROBNP 425.0* 601.0*   HbA1C: Recent Labs    01/24/22 0347  HGBA1C 5.5   CBG: Recent Labs  Lab 01/24/22 0422 01/24/22 0913  GLUCAP 77 77   Lipid Profile: No results for input(s): CHOL, HDL, LDLCALC, TRIG, CHOLHDL, LDLDIRECT in the last 72  hours. Thyroid Function Tests: No results for input(s): TSH, T4TOTAL, FREET4, T3FREE, THYROIDAB in the last 72 hours. Anemia Panel: No results for input(s): VITAMINB12, FOLATE, FERRITIN, TIBC, IRON, RETICCTPCT in the last 72 hours. Urine analysis:    Component Value Date/Time   COLORURINE YELLOW 01/27/2021 1756   APPEARANCEUR CLEAR 01/27/2021 1756   LABSPEC 1.006 01/27/2021 1756   PHURINE 7.0 01/27/2021 1756   GLUCOSEU NEGATIVE 01/27/2021 1756   HGBUR NEGATIVE 01/27/2021 1756   BILIRUBINUR negative 06/17/2021 1521   KETONESUR NEGATIVE 01/27/2021 1756   PROTEINUR Positive (A) 06/17/2021 1521   PROTEINUR NEGATIVE 01/27/2021 1756   UROBILINOGEN 0.2 06/17/2021 1521   NITRITE negative 06/17/2021 1521   NITRITE NEGATIVE 01/27/2021 1756   LEUKOCYTESUR Negative 06/17/2021 1521   LEUKOCYTESUR NEGATIVE 01/27/2021 1756    Radiological Exams on Admission: DG Chest 2 View  Result Date: 01/23/2022 CLINICAL DATA:  A 76 year old male presents for evaluation of shortness of breath. EXAM: CHEST - 2 VIEW COMPARISON:  January 12, 2022. FINDINGS: Signs of median sternotomy for aortic valve replacement. Cardiomediastinal contours are stable. Increased interstitial markings throughout the chest. Blunting of the RIGHT costodiaphragmatic sulcus and to a lesser extent small effusion also on the LEFT. Bibasilar airspace disease. Fullness of central pulmonary vasculature. On limited assessment there is no acute skeletal process. IMPRESSION: 1. Findings suggest pulmonary edema with bilateral effusions, greater on the RIGHT. 2. Bibasilar airspace disease, more likely atelectasis in the current context. Correlate with signs of heart failure. Electronically Signed   By: Zetta Bills M.D.   On: 01/23/2022 13:01    EKG: Independently reviewed.  A. fib, no acute ST changes, prolonged QTC  Assessment/Plan Principal Problem:   Acute systolic CHF (congestive heart failure) (HCC) Active Problems:   Acute on chronic  diastolic congestive heart failure, NYHA class 3 (HCC)   Acute on chronic systolic CHF decompensation, NYHA class III -Cardiology started IV Lasix 80 mg daily. -Echo was done 3 months ago, will not repeat this time. -Continue midodrine. -Agreed with cardiology to hold some of the BP and pulmonary hypertension medications to allow IV diuresis. -PT evaluation. -Other DDx, low suspicion for DVT patient is on Coumadin and INR therapeutic.  Hyponatremia -Dilutional secondary to CHF decompensation  Hypotension, orthostatic, history of -No mentation changes, blood pressure measurement appears to be at baseline, continue midodrine.  Alcohol abuse -No symptoms or signs of active withdrawal, CIWA protocol with as needed benzos.  IIDM -Sliding scale for now  Liver cirrhosis -LFT pending this time, appears to have stable and normal albumin level -Elevated INR likely from Coumadin but not coagulopathy.  Pulmonary hypertension -As mentioned above, will hold off Uptravi and Opsumit -Continue sildenafil  Status post mechanical aortic valve replacement -Consult pharmacy for redosing Coumadin  Chronic A. Fib -Rate controlled, on Coumadin.   DVT prophylaxis: Coumadin Code Status: Full code Family Communication: None  disposition Plan: Expect more than 2  midnight hospital stay to allow aggressive diuresis. Consults called: Cardiology Admission status: Telemetry admission   Stiles Maxcy A MD [facility]

## 2022-01-25 DIAGNOSIS — J90 Pleural effusion, not elsewhere classified: Secondary | ICD-10-CM

## 2022-01-25 DIAGNOSIS — I5043 Acute on chronic combined systolic (congestive) and diastolic (congestive) heart failure: Secondary | ICD-10-CM | POA: Diagnosis not present

## 2022-01-25 DIAGNOSIS — Z952 Presence of prosthetic heart valve: Secondary | ICD-10-CM | POA: Diagnosis not present

## 2022-01-25 DIAGNOSIS — Z711 Person with feared health complaint in whom no diagnosis is made: Secondary | ICD-10-CM

## 2022-01-25 DIAGNOSIS — R5381 Other malaise: Secondary | ICD-10-CM | POA: Diagnosis not present

## 2022-01-25 DIAGNOSIS — Z7189 Other specified counseling: Secondary | ICD-10-CM

## 2022-01-25 DIAGNOSIS — I9589 Other hypotension: Secondary | ICD-10-CM

## 2022-01-25 DIAGNOSIS — F1010 Alcohol abuse, uncomplicated: Secondary | ICD-10-CM | POA: Diagnosis not present

## 2022-01-25 DIAGNOSIS — I482 Chronic atrial fibrillation, unspecified: Secondary | ICD-10-CM

## 2022-01-25 DIAGNOSIS — R54 Age-related physical debility: Secondary | ICD-10-CM

## 2022-01-25 DIAGNOSIS — Z515 Encounter for palliative care: Secondary | ICD-10-CM

## 2022-01-25 DIAGNOSIS — E871 Hypo-osmolality and hyponatremia: Secondary | ICD-10-CM

## 2022-01-25 DIAGNOSIS — Z789 Other specified health status: Secondary | ICD-10-CM

## 2022-01-25 DIAGNOSIS — K219 Gastro-esophageal reflux disease without esophagitis: Secondary | ICD-10-CM | POA: Diagnosis present

## 2022-01-25 DIAGNOSIS — Z66 Do not resuscitate: Secondary | ICD-10-CM

## 2022-01-25 DIAGNOSIS — E782 Mixed hyperlipidemia: Secondary | ICD-10-CM

## 2022-01-25 DIAGNOSIS — D509 Iron deficiency anemia, unspecified: Secondary | ICD-10-CM

## 2022-01-25 DIAGNOSIS — I5021 Acute systolic (congestive) heart failure: Secondary | ICD-10-CM | POA: Diagnosis not present

## 2022-01-25 DIAGNOSIS — N189 Chronic kidney disease, unspecified: Secondary | ICD-10-CM | POA: Diagnosis present

## 2022-01-25 DIAGNOSIS — I2721 Secondary pulmonary arterial hypertension: Secondary | ICD-10-CM

## 2022-01-25 DIAGNOSIS — N179 Acute kidney failure, unspecified: Secondary | ICD-10-CM | POA: Diagnosis present

## 2022-01-25 LAB — CBC
HCT: 33 % — ABNORMAL LOW (ref 39.0–52.0)
Hemoglobin: 11.1 g/dL — ABNORMAL LOW (ref 13.0–17.0)
MCH: 26.6 pg (ref 26.0–34.0)
MCHC: 33.6 g/dL (ref 30.0–36.0)
MCV: 78.9 fL — ABNORMAL LOW (ref 80.0–100.0)
Platelets: 122 10*3/uL — ABNORMAL LOW (ref 150–400)
RBC: 4.18 MIL/uL — ABNORMAL LOW (ref 4.22–5.81)
RDW: 21.1 % — ABNORMAL HIGH (ref 11.5–15.5)
WBC: 3.6 10*3/uL — ABNORMAL LOW (ref 4.0–10.5)
nRBC: 0 % (ref 0.0–0.2)

## 2022-01-25 LAB — BASIC METABOLIC PANEL
Anion gap: 9 (ref 5–15)
BUN: 17 mg/dL (ref 8–23)
CO2: 23 mmol/L (ref 22–32)
Calcium: 9.4 mg/dL (ref 8.9–10.3)
Chloride: 94 mmol/L — ABNORMAL LOW (ref 98–111)
Creatinine, Ser: 1.23 mg/dL (ref 0.61–1.24)
GFR, Estimated: >60 mL/min (ref >60)
Glucose, Bld: 94 mg/dL (ref 70–99)
Potassium: 3.9 mmol/L (ref 3.5–5.1)
Sodium: 126 mmol/L — ABNORMAL LOW (ref 135–145)

## 2022-01-25 LAB — GLUCOSE, CAPILLARY
Glucose-Capillary: 96 mg/dL (ref 70–99)
Glucose-Capillary: 129 mg/dL — ABNORMAL HIGH (ref 70–99)
Glucose-Capillary: 135 mg/dL — ABNORMAL HIGH (ref 70–99)
Glucose-Capillary: 117 mg/dL — ABNORMAL HIGH (ref 70–99)

## 2022-01-25 LAB — PROTIME-INR
INR: 3.3 — ABNORMAL HIGH (ref 0.8–1.2)
Prothrombin Time: 33.3 seconds — ABNORMAL HIGH (ref 11.4–15.2)

## 2022-01-25 MED ORDER — MILRINONE LACTATE IN DEXTROSE 20-5 MG/100ML-% IV SOLN
0.1250 ug/kg/min | INTRAVENOUS | Status: DC
  Administered 2022-01-25 – 2022-01-26 (×2): 0.125 ug/kg/min via INTRAVENOUS
  Filled 2022-01-25: qty 100

## 2022-01-25 MED ORDER — WARFARIN SODIUM 1 MG PO TABS
1.0000 mg | ORAL_TABLET | Freq: Once | ORAL | Status: AC
  Administered 2022-01-25: 1 mg via ORAL
  Filled 2022-01-25: qty 1

## 2022-01-25 MED ORDER — POTASSIUM CHLORIDE CRYS ER 20 MEQ PO TBCR
40.0000 meq | EXTENDED_RELEASE_TABLET | Freq: Once | ORAL | Status: AC
  Administered 2022-01-25: 40 meq via ORAL
  Filled 2022-01-25: qty 2

## 2022-01-25 MED ORDER — POLYVINYL ALCOHOL 1.4 % OP SOLN
1.0000 [drp] | OPHTHALMIC | Status: DC | PRN
  Administered 2022-01-25 – 2022-02-04 (×3): 1 [drp] via OPHTHALMIC
  Filled 2022-01-25: qty 15

## 2022-01-25 MED ORDER — MELATONIN 5 MG PO TABS
5.0000 mg | ORAL_TABLET | Freq: Every day | ORAL | Status: DC
  Administered 2022-01-25 – 2022-01-28 (×4): 5 mg via ORAL
  Filled 2022-01-25 (×4): qty 1

## 2022-01-25 NOTE — Assessment & Plan Note (Addendum)
Continue antiacid therapy., patient is tolerating po well.

## 2022-01-25 NOTE — Assessment & Plan Note (Addendum)
Patient had a prolonged hospitalization, he received aggressive diuresis with furosemide and inotropic support with milrinone. Negative fluid balance was achieved with significant improvement of his symptoms.  At the time of his discharge he is negative 15,975 ml.   Further work up with echocardiogram showed EF 40 to 50% on LV with global hypokinesis. Decreased RV systolic function with mild enlarged cavity. Moderate to severe Tricuspid regurgitation.  Patient will continue diuresis with torsemide 20 mg daily and instructions to increase in case recurrent edema to bid dosing.   Patient not on B blockade or RASS inhibition due to risk of worsening hypotension.

## 2022-01-25 NOTE — Progress Notes (Signed)
Progress Note   Patient: Lance Hicks WIO:[id number] DOB: 06-May-1946 DOA: 01/23/2022     2 DOS: the patient was seen and examined on 01/25/2022   Brief hospital course: Lance Hicks is a 76 year old male with past medical history significant for chronic systolic congestive heart failure NYHA class III, essential pretension, CAD s/p CABG, aortic stenosis s/p aortic valve replacement on Coumadin, orthostatic hypotension, COPD, pulmonary hypertension, type 2 diabetes mellitus, paroxysmal atrial fibrillation, cirrhosis secondary to EtOH abuse who presented to [facility] ED on 1/30 with progressive lower extremity edema.  Recently hospitalized at [facility] for pneumonia; treated with antibiotics in which his Lasix was held for 3 days for "low blood pressure".  Discharged home and patient started noticed increased swelling of his lower legs and he resumed his Lasix for the past 3 days however with no improvement.  Patient also reporting exertional dyspnea and was noted by his friend to be "very winded" and sent him to the hospital for further evaluation.  Denies cough, no fever/chills.  In the ED, BP was slightly hypotensive, found to be hypoxic and placed on 3 L nasal cannula.  Chest x-ray with bilateral pulmonary congestion.  Cardiology consulted and patient was given 80 mg IV Lasix.  Hospital service consulted for further evaluation management.   Assessment and Plan: Acute on chronic combined systolic and diastolic CHF (congestive heart failure) ([place])- (present on admission) Patient presenting to ED with progressive shortness of breath in the setting of his home Lasix.  Patient was found to be hypoxic requiring submental oxygen to maintain adequate SPO2.  BNP elevated 1831.  Chest x-ray with pulmonary edema, bilateral effusions greater on right. --Cardiology following, appreciate assistance --net negative 2.3L past 24h and net negative 4.3L since admission --Wt 59.4>57.2kg --Furosemide  drip --Milrinone --Midodrine 15 mg p.o. TID --Spironolactone 25 mg p.o. daily --Supplemental oxygen now titrated off --Strict I's and O's and daily weights --Monitor renal function closely daily --Given overall poor prognosis, palliative care consulted for assistance with goals of care and medical decision making  Chronic atrial fibrillation ([place])- (present on admission) Rate controlled, not on beta-blocker/CCB outpatient. --Continue Coumadin, pharmacy consulted for dosing/monitoring (INR goal 2.5-3.5)  [name] --Continue Coumadin, pharmacy consulted for dosing/monitoring --Goal INR 2.5-3.5; 3.5 today --Continue monitor INR daily  Hypotension, chronic- (present on admission) --Continue midodrine 15 mg p.o. TID  Hyperlipidemia- (present on admission) --Crestor 5 mg p.o. daily  GERD (gastroesophageal reflux disease)- (present on admission) --Protonix 40 mg p.o. daily  Alcohol abuse- (present on admission) Counseled on need for cessation. --CIWA protocol with symptom triggered Ativan --Thiamine, multivitamin, folic acid  Iron deficiency anemia- (present on admission) --Check anemia panel in a.m. --Ferrous sulfate 325 mg p.o. every other day  Hyponatremia- (present on admission) Sodium 131 on admission, etiology likely secondary to hypervolemic hyponatremia in the setting of volume overload/CHF exacerbation as above. --IV lasix and spironolactone as above --BMP daily  Pulmonary arterial hypertension ([place])- (present on admission) Home regimen includes tadalafil 40 mg p.o. every morning. --Selexipag 225mg PO BID  Physical deconditioning- (present on admission) Patient with significant weakness, debility, gait disturbance and deconditioning from recurrent hospitalization.  PT/OT currently recommends SNF placement, patient declines and states has many friends including the [employer] community will assist him when he returns home. --Continue PT/OT  efforts while inpatient, anticipate home with home health when ready for discharge        Subjective:  Patient seen examined bedside, resting comfortably.  Continues with mild  hypotension this morning, milrinone drip discontinued.  Patient declined PICC placement yesterday, frustrated about progress so far in regards to his lower extremity edema.  Discussed with patient therapy recommendations of SNF; patient states does not want to talk about this and declines rehab placement; states "I have many friends/the [employer] community that will help me when I return home".  No other specific questions or concerns at this time.  RN concerned about low blood pressure this morning; otherwise no other acute concerns overnight per nursing staff.  Physical Exam: Vitals:   01/25/22 0715 01/25/22 0721 01/25/22 0730 01/25/22 1100  BP: 91/67 (!) 79/65 98/66 108/88  Pulse: 79 96 99 66  Resp:    20  Temp: 97.6 F (36.4 C)   97.6 F (36.4 C)  TempSrc: Oral   Oral  SpO2: 98% 96% 96% 98%  Weight:      Height:       Physical Exam GEN: 76 yo male in NAD, alert and oriented x 3, thin/cachectic/chronically ill in appearance HEENT: NCAT, PERRL, EOMI, sclera clear, MMM PULM: CTAB w/o wheezes/crackles, normal respiratory effort, on room air CV: Irregularly irregular rhythm, normal rate w/o M/G/R, elevated JVP, 1+ pitting edema bilateral lower extremities GI: abd soft, NTND, NABS, no R/G/M MSK: no peripheral edema, muscle strength globally intact 5/5 bilateral upper/lower extremities NEURO: CN II-XII intact, no focal deficits, sensation to light touch intact PSYCH: normal mood/affect Integumentary: dry/intact, no rashes or wounds   Data Reviewed:  Reviewed with BMP, CBC, INR this morning.  Family Communication: No family present at bedside this morning  Disposition: Status is: Inpatient Remains inpatient appropriate because: Milrinone/Lasix drip, remains hypotensive, severely volume overload, awaiting  cardiology signed off; anticipate discharge home with home health as patient has declined SNF placement  Planned Discharge Destination: Home with Home Health           Time spent: 49 minutes spent on chart review, discussion with nursing staff, consultants, updating family and interview/physical exam; more than 50% of that time was spent in counseling and/or coordination of care.  Author: Donnamarie Poag [place] DO 01/25/2022 1:43 PM  For on call review [email or web address].

## 2022-01-25 NOTE — Assessment & Plan Note (Addendum)
Patient with significant weakness, debility, gait disturbance and deconditioning from recurrent hospitalization.  PT/OT currently recommends SNF.  Patient has agreed to be transferred to SNF where he can continue getting physical therapy and medical therapy for his heart failure.

## 2022-01-25 NOTE — Assessment & Plan Note (Addendum)
No signs of valve dysfunction, per echocardiogram.   Patient was continued on warfarin for anticoagulation, his discharge INR is 1,9 Keep target INR 2 to 3.

## 2022-01-25 NOTE — Assessment & Plan Note (Addendum)
No signs of acute withdrawal.  Continue neuro checks per unit protocol.

## 2022-01-25 NOTE — Assessment & Plan Note (Addendum)
Patient will continue with midodrine tid for hypotension He has poor prognosis, limited pharmacotherapy for cardiovascular disease due to hypotension.

## 2022-01-25 NOTE — Progress Notes (Addendum)
Advanced Heart Failure Rounding Note  PCP-Cardiologist: None   Subjective:   1/31 Started on milrinone 0.25 mcg + lasix 20 mg+ metolazone. Brisk diuresis noted. Negative 3.1 liters. Macitentan held.   Milrinone stopped this morning due to SBP < 80.   Feeling better. Denies SOB.     Objective:   Weight Range: 57.2 kg Body mass index is 18.62 kg/m.   Vital Signs:   Temp:  [97.3 F (36.3 C)-97.6 F (36.4 C)] 97.6 F (36.4 C) (02/01 0715) Pulse Rate:  [34-99] 99 (02/01 0730) Resp:  [16-29] 18 (02/01 0433) BP: (79-122)/(60-85) 98/66 (02/01 0730) SpO2:  [93 %-99 %] 96 % (02/01 0730) Weight:  [57.2 kg-59.4 kg] 57.2 kg (02/01 0433) Last BM Date: 01/23/22  Weight change: Filed Weights   01/24/22 1616 01/25/22 0433  Weight: 59.4 kg 57.2 kg    Intake/Output:   Intake/Output Summary (Last 24 hours) at 01/25/2022 0957 Last data filed at 01/25/2022 0802 Gross per 24 hour  Intake 1009.88 ml  Output 4500 ml  Net -3490.12 ml      Physical Exam   General:  Thin. No resp difficulty HEENT: normal Neck: supple. JVP 8-9 . Carotids 2+ bilat; no bruits. No lymphadenopathy or thryomegaly appreciated. Cor: PMI nondisplaced. Irregualar rate & rhythm. No rubs, gallops. Mechanical S2.  Lungs: clear Abdomen: soft, nontender, nondistended. No hepatosplenomegaly. No bruits or masses. Good bowel sounds. Extremities: no cyanosis, clubbing, rash, edema. R and L upper thigh 1+ Neuro: alert & orientedx3, cranial nerves grossly intact. moves all 4 extremities w/o difficulty. Affect pleasant   Telemetry   A fib 50-70s. With PVCs.  EKG    N/A  Labs    CBC Recent Labs    01/23/22 1145 01/24/22 0347 01/25/22 0511  WBC 4.5 5.2 3.6*  NEUTROABS 4.0  --   --   HGB 11.0* 11.3* 11.1*  HCT 35.0* 35.2* 33.0*  MCV 82.0 81.9 78.9*  PLT 132* 121* 473*   Basic Metabolic Panel Recent Labs    01/24/22 1624 01/25/22 0511  NA 131* 126*  K 5.0 3.9  CL 96* 94*  CO2 24 23  GLUCOSE 102*  94  BUN 15 17  CREATININE 1.42* 1.23  CALCIUM 9.7 9.4   Liver Function Tests Recent Labs    01/24/22 0347  AST 27  ALT 12  ALKPHOS 54  BILITOT 2.0*  PROT 6.1*  ALBUMIN 2.7*   No results for input(s): LIPASE, AMYLASE in the last 72 hours. Cardiac Enzymes No results for input(s): CKTOTAL, CKMB, CKMBINDEX, TROPONINI in the last 72 hours.  BNP: BNP (last 3 results) Recent Labs    09/30/21 0241 01/12/22 1524 01/23/22 1145  BNP 840.7* 1,341.7* 1,831.8*    ProBNP (last 3 results) Recent Labs    07/04/21 1607 08/16/21 1620  PROBNP 425.0* 601.0*     D-Dimer No results for input(s): DDIMER in the last 72 hours. Hemoglobin A1C Recent Labs    01/24/22 0347  HGBA1C 5.5   Fasting Lipid Panel No results for input(s): CHOL, HDL, LDLCALC, TRIG, CHOLHDL, LDLDIRECT in the last 72 hours. Thyroid Function Tests No results for input(s): TSH, T4TOTAL, T3FREE, THYROIDAB in the last 72 hours.  Invalid input(s): FREET3  Other results:   Imaging    [place] EKG SITE RITE  Result Date: 01/24/2022 If Site Rite image not attached, placement could not be confirmed due to current cardiac rhythm.    Medications:     Scheduled Medications:  aspirin EC  81 mg  Oral q AM   docusate sodium  200 mg Oral Daily   famotidine  10 mg Oral Daily   ferrous sulfate  325 mg Oral QODAY   folic acid  1 mg Oral Daily   insulin aspart  0-9 Units Subcutaneous TID WC   melatonin  3 mg Oral QHS   midodrine  15 mg Oral TID WC   multivitamin with minerals  1 tablet Oral Daily   pantoprazole  40 mg Oral Daily   polyethylene glycol  17 g Oral Daily   potassium chloride  40 mEq Oral BID   rosuvastatin  5 mg Oral Daily   Selexipag  200 mcg Oral BID   sodium chloride flush  3 mL Intravenous Q12H   spironolactone  25 mg Oral Daily   thiamine  100 mg Oral Daily   Or   thiamine  100 mg Intravenous Daily   tiZANidine  2 mg Oral TID   Warfarin - Pharmacist Dosing Inpatient   Does not apply q1600     Infusions:  sodium chloride     furosemide (LASIX) 200 mg in dextrose 5% 100 mL (18m/mL) infusion 20 mg/hr (01/24/22 1802)   milrinone 0.25 mcg/kg/min (01/24/22 1636)    PRN Medications: sodium chloride, acetaminophen, LORazepam **OR** LORazepam, ondansetron (ZOFRAN) IV, ondansetron, polyvinyl alcohol, simethicone, sodium chloride flush    Patient Profile   76y/o old male with end-stage PAH/RV failure with chronic hypotension and hyponatremia. Also has CKD 3b, CAD s/p CABG/AVR.   Over past year multiple admits for volume overload and hyponatremia.    Recent had accidental ingestion of fuel injector cleaner. [facility]    Now presents with hypotension and volume overload   Assessment/Plan   1. A/C Biventricular Heart Failure, Predominant RV Failure   - Echo 10/22 EF 45-50% RV low normal  - Marked volume overload. BNP 1831. CXR with pulmonary edema.  - Brisk diuresis with milrinone 0.25 cmg + lasix drip + metolazone.  - Due to hypotension milrinone held earlier. Will restart at milrinone 0.125 mcg.  - Volume status improving. Brisk diuresis noted. Cut back lasix drip to 10 mg per hour.  - Renal function stable.     2. PAH  -WHO Group I & II - Auto-immune serologies negative (checked twice). - ? Component of HHT/shunt/AVMs with late bubbles on bubble study.  - Echo with bubble study (05/16/18): EF 55-60% with "late bubbles" , Mechanical AVR stable with trivial perivalvular regurg, Mild MR, Severe LAE, Severe RV dilation and reduced function. No PFO. Mod TR, PA peak pressure 68 mm Hg - Echo (4/22): EF 50-55%, RV ok, severe RVSP 61.6 mmHg, severe biatrial enlargement, moderate TR, stable AVR, AV mean gradient 11 mmHg - Echo 10/22 EF 45-50 RV low normal  - Ab u/s with cirrhosis but no evidence of portal HTN. - Hold sildenafil and Macitentan w/ hypotension. Continue Uptravi.    3. Chronic A fib  -No longer on bb due to bradycardia.  - INR 3.3 .  hold Coumadin    4. Hypotension  - in setting of RV failure - continue midodrine 15 tid   5.. Hypervolemic Hyponatremia  - Sodium 130-->126  - Diuresis per above, fluid restrict    6. PVCs, frequent  - Supp K. Check Mag in am.   7.. CAD  - [place] pain    8. Leukocytoclastic Vasculitis - Followed by Rheumatology  9. Cirrhosis US Abdomen RUQ 05/16/18 - s/p cholecystectomy. + hepatic cirrhosis. Mild ascites.  - Liver Doppler 05/16/18 - No hepatic, splenic, or portal venous thrombosis or occlusion. Mild ascites   9. S/P Mechanical AVR - On coumadin PTA, holding w/ supratherapeutic INR, 3.3    He does not want to go SNF. Wants HH.    Length of Stay: 2  Darrick Grinder, NP  01/25/2022, 9:57 AM  Advanced Heart Failure Team Pager [phone] (M-F; 7a - 5p)  Please contact [facility] for night-coverage after hours (5p -7a ) and weekends on [email or web address]  Patient seen and examined with the above-signed Advanced Practice Provider and/or Housestaff. I personally reviewed laboratory data, imaging studies and relevant notes. I independently examined the patient and formulated the important aspects of the plan. I have edited the note to reflect any of my changes or salient points. I have personally discussed the plan with the patient and/or family.  Hemodynamically improved on milrinone. Diuresing well. However remains extremely weak.  Can barely eat.   General:  Terminally-ill appearing. No resp difficulty HEENT: normal + temporal wasting Neck: supple. JVP 7-8 Carotids 2+ bilat; no bruits. No lymphadenopathy or thryomegaly appreciated. Cor: PMI nondisplaced. Irregular rate & rhythm.+ mech s2 Lungs: clear Abdomen: soft, nontender, nondistended. No hepatosplenomegaly. No bruits or masses. Good bowel sounds. Extremities: no cyanosis, clubbing, rash, tr-1+ edema Neuro: alert & orientedx3, cranial nerves grossly intact. moves all 4 extremities w/o  difficulty. Affect pleasant  He appears to be actively dying from end-stage RV failure but he is not ready to give up. Will continue milrinone support for now. Will consult Palliative Care for help with GOC.  Glori Bickers, MD  12:39 PM

## 2022-01-25 NOTE — Progress Notes (Signed)
Physical Therapy Treatment Patient Details Name: Lance Hicks MRN: [id number] DOB: 22-Jan-1946 Today's Date: 01/25/2022   History of Present Illness 76 yo male presenting to ED on 1/30 with increased LE edema and shortness of breath. Patient with recent admit to [facility] with  pneumonia and accidental ingestion of feul injection fluid, but leaving AMA. PHM includes: a fib, a flutter, chronic anticoagulation, chronic back pain, COPD, CAD, dyspnea, HTN, aortic valve replacement 2018, CABG 2018, HLD, kidney stones, leukocytoclastic vasculitis, pleural effusion, syncope, wrist and forearm fx surgery, and alcohol abuse.    PT Comments    Pt was seen for therapy with low tolerance to move today and required help to remain awake for the therapy session.  However, he is verbalizing motivation to go home, despite his limited activity tolerance currently.  Follow along with pt for strength, gait as able and to encourage him to go to rehab.  His likelihood of agreeing is going to be low based on his discussion with PT.  Possibly family can intervene for his need to get more intensive rehab to go home.   Recommendations for follow up therapy are one component of a multi-disciplinary discharge planning process, led by the attending physician.  Recommendations may be updated based on patient status, additional functional criteria and insurance authorization.  Follow Up Recommendations  Skilled nursing-short term rehab (<3 hours/day)     Assistance Recommended at Discharge Intermittent Supervision/Assistance  Patient can return home with the following A little help with walking and/or transfers;A little help with bathing/dressing/bathroom;Assistance with cooking/housework;Assist for transportation;Help with stairs or ramp for entrance   Equipment Recommendations  None recommended by PT    Recommendations for Other Services       Precautions / Restrictions Precautions Precautions:  Fall Precaution Comments: monitor O2 sats Restrictions Weight Bearing Restrictions: No     Mobility  Bed Mobility Overal bed mobility: Needs Assistance Bed Mobility: Rolling Rolling: Min guard (uses rails)         General bed mobility comments: declines OOB    Transfers                   General transfer comment: declines    Ambulation/Gait                   Stairs             Wheelchair Mobility    Modified Rankin (Stroke Patients Only)       Balance                                            Cognition Arousal/Alertness: Awake/alert Behavior During Therapy: Flat affect Overall Cognitive Status: No family/caregiver present to determine baseline cognitive functioning                                 General Comments: pt is reporting he will not even consider rehab placement despite feeling weak, but has been getting neighbor help at home        Exercises General Exercises - Lower Extremity Ankle Circles/Pumps: AROM, AAROM, 5 reps Quad Sets: AROM, 10 reps Gluteal Sets: AROM, 10 reps Heel Slides: AAROM, 10 reps Hip ABduction/ADduction: AROM, AAROM, 10 reps Straight Leg Raises: AAROM, 10 reps Hip Flexion/Marching: AROM, 10 reps    General Comments  General comments (skin integrity, edema, etc.): pt is declining to get up to side of bed, but also lethargic and making very limited effort to do ex's      Pertinent Vitals/Pain Pain Assessment Pain Assessment: No/denies pain    Home Living                          Prior Function            PT Goals (current goals can now be found in the care plan section) Acute Rehab PT Goals Patient Stated Goal: to walk and go home again, NO REHAB Progress towards PT goals: Not progressing toward goals - comment    Frequency    Min 3X/week      PT Plan Current plan remains appropriate    Co-evaluation              AM-PAC PT "6  Clicks" Mobility   Outcome Measure  Help needed turning from your back to your side while in a flat bed without using bedrails?: A Little Help needed moving from lying on your back to sitting on the side of a flat bed without using bedrails?: A Little Help needed moving to and from a bed to a chair (including a wheelchair)?: A Little Help needed standing up from a chair using your arms (e.g., wheelchair or bedside chair)?: A Little Help needed to walk in hospital room?: A Lot Help needed climbing 3-5 steps with a railing? : Total 6 Click Score: 15    End of Session   Activity Tolerance: Patient tolerated treatment well;Patient limited by fatigue Patient left: in bed;with call bell/phone within reach;with bed alarm set Nurse Communication: Mobility status PT Visit Diagnosis: Unsteadiness on feet (R26.81);Muscle weakness (generalized) (M62.81);Difficulty in walking, not elsewhere classified (R26.2);Adult, failure to thrive (R62.7)     Time: 0233-4356 PT Time Calculation (min) (ACUTE ONLY): 10 min  Charges:  $Therapeutic Exercise: 8-22 mins       Ramond Dial 01/25/2022, 4:02 PM  Mee Hives, PT PhD Acute Rehab Dept. Number: [place] [facility] [place]

## 2022-01-25 NOTE — Progress Notes (Addendum)
This RN notified by IV team  nurse that pt is refusing PICC line. This RN verified that the pt understood the purpose of the PICC and pt refused.   MD aware.

## 2022-01-25 NOTE — Hospital Course (Signed)
° ° ° °

## 2022-01-25 NOTE — Assessment & Plan Note (Addendum)
Patient very weak and deconditioned with poor respiratory reserve.  His oxygenation is 91% on room air at rest. He has poor prognosis, palliative care was consulted, his code status was changed to DNR and he was offered hospice services. Patient at this point in time would like to continue with medical care for his cardiopulmonary disease.   Continue with Selexipag and as needed supplemental 02 to keep oxygen saturation 88% or greater.

## 2022-01-25 NOTE — Assessment & Plan Note (Addendum)
Continue with statin therapy.  ?

## 2022-01-25 NOTE — Progress Notes (Addendum)
Brief Palliative Medicine Progress Note:  PMT consult received and chart reviewed. Full GOC completed with patient - full note to follow:  Recommendations/Plan: Continue current full scope medical treatment Now DNR/DNI - durable DNR form completed and placed in shadow chart. Copy was made and will be scanned into Vynca/ACP tab Patient's goals align with hospice philosophy, but he is not agreeable to stop HHPT/OT. He also seems to have unrealistic expectations on what his cardiac recovery will be despite education. When/if he's ready, he would be hospice appropriate Patient's goal remains for discharge home with HHPT/OT - he refuses rehab. He is agreeable for outpatient Palliative Care to follow [facility] consulted for: outpatient Palliative Care referral Chaplain consulted for: HCPOA document completion - to be friend/Nathan Wynetta Emery  PMT will continue to follow peripherally. If there are any imminent needs please call the service directly   Thank you for allowing PMT to assist in the care of this patient.  Chealsey Miyamoto M. [facility] Palliative Medicine Team Team Phone: [phone] NO CHARGE

## 2022-01-25 NOTE — Hospital Course (Addendum)
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  Recently hospitalized at [facility] for pneumonia; treated with antibiotics in which his Lasix was held for 3 days for "low blood pressure".  Discharged home and patient started noticed increased swelling of his lower legs and he resumed his Lasix for the past 3 days however with no improvement.  Patient also reporting exertional dyspnea and was noted by his friend to be "very winded" and sent him to the hospital for further evaluation.  Denies cough, no fever/chills.  In the ED, BP was slightly hypotensive, found to be hypoxic and placed on 3 L nasal cannula.  BP 77/60, HR 38 to 53, RR 15 and oxygen saturation 100% on supplemental 02 per [place]. His lungs had rales on auscultation bilaterally, heart with S1 and S2 present and rhythmic, abdomen soft and positive lower extremity edema.   Na 130, K 3,5, Cl 97, bicarbonate 25, bun 11 and cr 1,15 BNP 1,831 Wbc 4,5, hgb 11,0 hct 35,0 plt 132  Sars covid 19 negative  Chest radiograph with bilateral interstitial infiltrates at the lower lobes with fluid in the right fissure, bilateral pleural effusions.   Cardiology consulted and patient was given 80 mg IV Lasix.  Hospital service consulted for further evaluation management.  EKG 83 bpm, left axis deviation, with right bundle branch block, atrial fibrillation, with no significant ST segment changes, and negative T wave lead I and AVL.   Patient was diuresed with furosemide with good response. He did required inotropic support with milrinone.  Continue to  be very weak and deconditioned, positive back and abdominal pain.   Patient very weak and deconditioned, pulmonary hypertension with acute on chronic core pulmonale.   Patient is being transferred to SNF for further physical therapy and occupational therapy.  Follow up with cardiology as outpatient.

## 2022-01-25 NOTE — Progress Notes (Signed)
ANTICOAGULATION CONSULT NOTE - Follow Up Consult  Pharmacy Consult for Warfarin Indication:  metal AVR  Allergies  Allergen Reactions   Augmentin [Amoxicillin-Pot Clavulanate] Anaphylaxis and Diarrhea    "Upset stomach"   Pantoprazole Sodium Nausea Only    Gets gassy and starting itching like crazy Gets gassy and starting itching like crazy   Valium [Diazepam] Other (See Comments)    HA and Abd pain.   Tape Rash and Other (See Comments)    Surgical tape   Wound Dressing Adhesive Other (See Comments) and Rash    Surgical tape Surgical tape Surgical tape    Vital Signs: Temp: 97.6 F (36.4 C) (02/01 0715) Temp Source: Oral (02/01 0715) BP: 98/66 (02/01 0730) Pulse Rate: 99 (02/01 0730)  Labs: Recent Labs    01/23/22 1145 01/23/22 1510 01/24/22 0347 01/24/22 1624 01/25/22 0511  HGB 11.0*  --  11.3*  --  11.1*  HCT 35.0*  --  35.2*  --  33.0*  PLT 132*  --  121*  --  122*  LABPROT  --  36.5* 40.0*  --  33.3*  INR  --  3.7* 4.1*  --  3.3*  CREATININE 1.15  --  1.05 1.42* 1.23     Estimated Creatinine Clearance: 42.6 mL/min (by C-G formula based on SCr of 1.23 mg/dL).   Medications:  Scheduled:   aspirin EC  81 mg Oral q AM   docusate sodium  200 mg Oral Daily   famotidine  10 mg Oral Daily   ferrous sulfate  325 mg Oral QODAY   folic acid  1 mg Oral Daily   insulin aspart  0-9 Units Subcutaneous TID WC   melatonin  3 mg Oral QHS   midodrine  15 mg Oral TID WC   multivitamin with minerals  1 tablet Oral Daily   pantoprazole  40 mg Oral Daily   polyethylene glycol  17 g Oral Daily   potassium chloride  40 mEq Oral BID   rosuvastatin  5 mg Oral Daily   Selexipag  200 mcg Oral BID   sodium chloride flush  3 mL Intravenous Q12H   spironolactone  25 mg Oral Daily   thiamine  100 mg Oral Daily   Or   thiamine  100 mg Intravenous Daily   tiZANidine  2 mg Oral TID   Warfarin - Pharmacist Dosing Inpatient   Does not apply q1600    Assessment: 76 y.o. male  with medical history significant of chronic systolic CHF NYHA class III, HTN, CAD and aortic stenosis status post CABG and metal AVR on Coumadin, orthostatic hypotension, COPD and pulmonary hypertension, IIDM, PAF, liver cirrhosis secondary to alcohol abuse, presented with increasing leg swelling.  Home regimen per patient: warfarin 44m MonWed and warfarin 2.557mall other days.   INR supratherapeutic this AM at 3.3. CBC stable. No bleeding issues noted.   Goal of Therapy:  INR goal 2.5-3.0 (per clinic note) Monitor platelets by anticoagulation protocol: Yes   Plan:  Warfarin 59m58monight Daily INR, s/s bleeding  FraErin HearingarmD., BCPS Clinical Pharmacist 01/25/2022 10:07 AM

## 2022-01-25 NOTE — Consult Note (Signed)
Consultation Note Date: 01/25/2022   Patient Name: Lance Hicks  DOB: [birth year]  MRN: [id number]  Age / Sex: 76 y.o., male  PCP: Elise Benne Referring Physician: [place] Eric J, DO  Reason for Consultation: Establishing goals of care  HPI/Patient Profile: 76 y.o. male  with past medical history of chronic systolic CHF NYHA class III, HTN, CAD and aortic stenosis s/p CABG and metal AVR on Coumadin, orthostatic hypotension, COPD and pulmonary hypertension, IIDM, PAF, liver cirrhosis secondary to alcohol abuse presented to ED on 01/23/22 with complaints of shortness of breath. On Saturday 01/21/22 checked himself out of [facility] where he was getting treated for pneumonia. Recently, he also accidentally drank fuel injector fluid. Patient was admitted on 01/23/2022 with acute on chronic CHF exacerbation, hypotension, and hyponatremia.   Clinical Assessment and Goals of Care: I have reviewed medical records including EPIC notes, labs, and imaging. Received report from primary RN - no acute concerns. RN reports patient has poor oral intake.  Went to visit patient at bedside - no family/visitors present. Patient was lying in bed awake, alert, oriented, and able to participate in conversation. No signs or non-verbal gestures of pain or discomfort noted. No respiratory distress, increased work of breathing, or secretions noted. Patient denies pain or shortness of breath.  Met with patient  to discuss diagnosis, prognosis, GOC, EOL wishes, disposition, and options.  I introduced Palliative Medicine as specialized medical care for people living with serious illness. It focuses on providing relief from the symptoms and stress of a serious illness. The goal is to improve quality of life for both the patient and the family.  We discussed a brief life review of the patient as well as functional and  nutritional status. 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 He is not married - he has one son and one daughter. Prior to hospitalization, patient was living in a private residence alone. He had no HH services. Patient describes his appetite at home as "ok" and tells me he was functionally independent. He enjoys being active in his church - he is of [employer].   We discussed patient's current illness and what it means in the larger context of patient's on-going co-morbidities. Education provided that CHF is a progressive, non-curable disease underlying the patient's current acute medical conditions. Reviewed his poor response to dieretics and his progression to end stage heart failure. Natural disease trajectory and expectations at EOL were discussed. I attempted to elicit values and goals of care important to the patient. The difference between aggressive medical intervention and comfort care was considered in light of the patient's goals of care. Patient tells me that "in 6 months my heart will be better after getting on the treadmill" and "after I get some sleep I'll be walking the halls faster than you." Reiterated several times that end stage heart failure is not reversible and it is anticipated he will continue to decline. He asks me "  have you ever met an Conservation officer, nature? We fight. You wont see me giving up." Discussed concepts of giving up vs "letting go and letting be."  Provided education and counseling at length on the philosophy and benefits of hospice care. Discussed that it offers a holistic approach to care in the setting of end-stage illness, and is about supporting the patient where they are allowing nature to take it's course. Discussed the hospice team includes RNs, physicians, social workers, and chaplains. They can provide personal care, support for the family, and help keep patient out of the hospital as well as assist with DME needs for  home hospice. Education provided on the difference between home vs residential hospice. Patient agrees his goals match the philosophy of hospice care, but he is not willing to stop PT to enroll. He also seems to have unrealistic expectations on what his cardiac recovery will be.   Palliative Care services outpatient were explained and offered - patient is agreeable.  Patient is clear he "will not" go to rehab facility. He is agreeable to HHPT. Reviewed concern for his safety returning home in his frail condition. Patient states he has Amish friends that can check on him often as well as his neighbors.   Advance directives, concepts specific to code status, artificial feeding and hydration, and rehospitalization were considered and discussed. Patient does not have a Living Will or HCPOA. He is interested in getting HCPOA completed while he's in house - he would want his friend [name] to be HCPOA.  Discussed with patient the importance of continued conversation with family and the medical providers regarding overall plan of care and treatment options, ensuring decisions are within the context of the patients values and GOCs.    Questions and concerns were addressed. The patient/family was encouraged to call with questions and/or concerns. PMT card was provided.   Primary Decision Maker: PATIENT    Recommendations/Plan: Continue current full scope medical treatment Now DNR/DNI - durable DNR form completed and placed in shadow chart. Copy was made and will be scanned into Vynca/ACP tab Patient's goals align with hospice philosophy, but he is not agreeable to stop HHPT/OT. He also seems to have unrealistic expectations on what his cardiac recovery will be despite education. When/if he's ready, he would be hospice appropriate Patient's goal remains for discharge home with HHPT/OT - he refuses rehab. He is agreeable for outpatient Palliative Care to follow [facility] consulted for: outpatient  Palliative Care referral Chaplain consulted for: HCPOA document completion - to be friend/Nathan Wynetta Emery  PMT will continue to follow peripherally. If there are any imminent needs please call the service directly   Code Status/Advance Care Planning: DNR  Palliative Prophylaxis:  Aspiration, Bowel Regimen, Delirium Protocol, Frequent Pain Assessment, Oral Care, and Turn Reposition  Additional Recommendations (Limitations, Scope, Preferences): Full Scope Treatment  Psycho-social/Spiritual:  Created space and opportunity for patient and family to express thoughts and feelings regarding patient's current medical situation.  Emotional support and therapeutic listening provided.  Prognosis:  Poor in the setting of advanced age, end stage HF, and multiple comorbidities   Discharge Planning: Home with Palliative Services      Primary Diagnoses: Present on Admission:  Acute on chronic combined systolic and diastolic CHF (congestive heart failure) (HCC)  Hypotension, chronic  Chronic atrial fibrillation (HCC)  Physical deconditioning  GERD (gastroesophageal reflux disease)  Iron deficiency anemia  Hyperlipidemia  Alcohol abuse  Pulmonary arterial hypertension ([place])  Hyponatremia   I have reviewed the medical  record, interviewed the patient and family, and examined the patient. The following aspects are pertinent.  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 In light of concurrent left nephrolithiasis, will check CT renal colic next year instead of renal US.    Anticoagulated on Coumadin 01/04/2018   Anxiety 05/10/2016   Last Assessment & Plan:  Doing well off of zoloft.   Asthma    Asymptomatic microscopic hematuria 06/20/2017   Last Assessment & Plan:  [facility]  which negative CT and cystoscopy. UA with 2+ blood last visit - we discussed recommendation for repeat workup at 5 years or if degree of hematuria progresses.    Atrial fibrillation ([place]) 03/29/2017   Atrial flutter ([place]) 03/29/2017   Backache 12/14/2015   Last Assessment & Plan:  Pain management referral for further evaluation.   Bilateral pleural effusion 08/09/2017   CHF (congestive heart failure) (HCC)    Chronic allergic rhinitis 04/28/2016   Last Assessment & Plan:  Continue astelin   Chronic anticoagulation 03/29/2017   Chronic atrial fibrillation ([place]) 07/23/2015   Last Assessment & Plan:  Coumadin and metoprolol, cardiology referral to establish care.   Chronic midline back pain 12/14/2015   Last Assessment & Plan:  Pain management referral for further evaluation.   Chronic obstructive lung disease ([place]) 06/12/2016   With hypoxia   Chronic prostatitis 07/23/2015   Last Assessment & Plan:  Has largely resolved since stopping bike riding. Recommend annual DRE AND PSA - will see back 12/2015 for annual screening, given 1st degree fhx. To call office for recurrent prostatitis symptoms.    Chronic respiratory failure with hypoxia ([place]) 07/29/2019   Cirrhosis of liver not due to alcohol ([place]) 07/01/2019   COPD (chronic obstructive pulmonary disease) ([place]) 06/12/2016   Coronary arteriosclerosis in native artery 03/29/2017   Coronary artery disease involving native coronary artery of native heart with angina pectoris ([place]) 03/29/2017   Cough 10/17/2016   Last Assessment & Plan:  Discussed typical course for acute viral illness. If symptoms worsen or fail to improve by 7-10d, delayed ATBs, fluids, rest, NSAIDs/APAP prn. Seek care if not improving. Needs earlier INR check due to ATBs.   Dyspnea 02/01/2016   Last Assessment & Plan:  Overall improving, eval by pulm, plan for CT, neg stress test with cardiology. Recent switch to carvedilol due to side effects.   Encounter for therapeutic drug monitoring 01/06/2019    Enterococcal bacteremia    Epidermoid cyst of skin 08/24/2017   Essential hypertension 12/14/2015   Last Assessment & Plan:  Hypertension control: controlled  Medications: compliant Medication Management: as noted in orders Home blood pressure monitoring recommended additionally as needed for symptoms  The patient's care plan was reviewed and updated. Instructions and counseling were provided regarding patient goals and barriers. He was counseled to adopt a healthy lifestyle. Educational resources and self-management tools have been provided as charted in [facility] list.    H/O maze procedure 03/29/2017   [name] 03/29/2017   Overview:  2011   History of coronary artery bypass graft 03/29/2017   Hx of CABG 03/29/2017   Hyperlipidemia 03/29/2017   Hypersensitivity angiitis ([place]) 10/01/2017   Hypertensive heart disease 03/29/2017   Hypertensive heart disease with heart failure ([place])  03/29/2017   Hypertensive heart failure ([place]) 03/29/2017   Hypokalemia due to excessive renal loss of potassium 02/18/2018   Hypotension, chronic 06/06/2018   International normalized ratio (INR) raised 07/26/2017   Kidney stone 07/23/2015   Kidney stones 07/23/2015   Overview:  x 3  Last Assessment & Plan:  By [place] has left nephrolithiasis, but not visible by KUB. Will check CT renal colic next year to assess both stone burden as well as to surveil AML.    Left ureteral stone 01/23/2018   Leukocytoclastic vasculitis ([place]) 10/01/2017   Localized edema 01/04/2018   Long term (current) use of anticoagulants 03/29/2017   Lumbar radicular pain 01/19/2016   Lumbar radiculopathy 01/19/2016   Maculopapular rash 09/03/2017   Microscopic hematuria 06/20/2017   Last Assessment & Plan:  Had hematuria workup in [facility] in 2016 which negative CT and cystoscopy. UA with 2+ blood last visit - we discussed recommendation for repeat workup at 5 years or if degree of hematuria progresses.    Multiple nodules of lung 06/12/2016   Nasal  discharge 02/25/2016   Last Assessment & Plan:  Trial zyrtec and flonase   Nephrolithiasis 07/23/2015   Overview:  x 3  Last Assessment & Plan:  By [place] has left nephrolithiasis, but not visible by KUB. Will check CT renal colic next year to assess both stone burden as well as to surveil AML.   Overview:  x 3  Last Assessment & Plan:  Has 11m nonobstructing LUP stone - not visible by KUB.  Will check renal [place] - he will contact office sooner if symptomatic.    Non-sustained ventricular tachycardia 03/29/2017   Nonsustained ventricular tachycardia 03/29/2017   Other hyperlipidemia 03/29/2017   Palpitations 10/01/2017   Pleural effusion, bilateral 08/09/2017   Pneumonia due to COVID-19 virus 02/07/2021   Post-nasal drainage 02/25/2016   Last Assessment & Plan:  Trial zyrtec and flonase   Prostate cancer screening 06/20/2017   Last Assessment & Plan:  Recommend continued annual CaP screening until within 10 years of life expectancy. Given good health and fhx of longevity, would anticipate CaP screening to continue until age 76  PSA today and again in one year on day of visit.   Pulmonary arterial hypertension ([place] 02/20/2018   Pulmonary edema    Pulmonary hypertension ([place] 08/09/2017   Pulmonary nodules 06/12/2016   S/P AVR 03/20/2016   S/P AVR (aortic valve replacement) 03/20/2016   Strain of deltoid muscle, initial encounter    Supratherapeutic INR 07/26/2017   Syncope and collapse 02/01/2016   Typical atrial flutter ([place] 02/01/2016   Social History   Socioeconomic History   Marital status: Single    Spouse name: Not on file   Number of children: Not on file   Years of education: Not on file   Highest education level: Not on file  Occupational History   Not on file  Tobacco Use   Smoking status: Former    Packs/day: 2.00    Years: 34.00    Pack years: 68.00    Types: Cigarettes    Quit date: 07/16/2000    Years since quitting: 21.5   Smokeless tobacco: Never  Vaping Use   Vaping Use: Never  used  Substance and Sexual Activity   Alcohol use: Not Currently   Drug use: No   Sexual activity: Not on file  Other Topics Concern   Not on file  Social History Narrative   Not on file   Social Determinants of  Health   Financial Resource Strain: Low Risk    Difficulty of Paying Living Expenses: Not very hard  Food Insecurity: No Food Insecurity   Worried About Running Out of Food in the Last Year: Never true   Ran Out of Food in the Last Year: Never true  Transportation Needs: No Transportation Needs   Lack of Transportation (Medical): No   Lack of Transportation (Non-Medical): No  Physical Activity: Not on file  Stress: Not on file  Social Connections: Not on file   Family History  Problem Relation Age of Onset   Asthma Mother    Arthritis Mother    Heart attack Father    Hypertension Father    Stroke Paternal Grandmother    Colon cancer Neg Hx    Scheduled Meds:  aspirin EC  81 mg Oral q AM   docusate sodium  200 mg Oral Daily   famotidine  10 mg Oral Daily   ferrous sulfate  325 mg Oral QODAY   folic acid  1 mg Oral Daily   insulin aspart  0-9 Units Subcutaneous TID WC   melatonin  5 mg Oral QHS   midodrine  15 mg Oral TID WC   multivitamin with minerals  1 tablet Oral Daily   pantoprazole  40 mg Oral Daily   polyethylene glycol  17 g Oral Daily   potassium chloride  40 mEq Oral BID   rosuvastatin  5 mg Oral Daily   Selexipag  200 mcg Oral BID   sodium chloride flush  3 mL Intravenous Q12H   spironolactone  25 mg Oral Daily   thiamine  100 mg Oral Daily   Or   thiamine  100 mg Intravenous Daily   tiZANidine  2 mg Oral TID   warfarin  1 mg Oral ONCE-1600   Warfarin - Pharmacist Dosing Inpatient   Does not apply q1600   Continuous Infusions:  sodium chloride     furosemide (LASIX) 200 mg in dextrose 5% 100 mL (50m/mL) infusion 10 mg/hr (01/25/22 1045)   milrinone 0.125 mcg/kg/min (01/25/22 1045)   PRN Meds:.sodium chloride, acetaminophen, LORazepam  **OR** LORazepam, ondansetron (ZOFRAN) IV, ondansetron, polyvinyl alcohol, simethicone, sodium chloride flush Medications Prior to Admission:  Prior to Admission medications   Medication Sig Start Date End Date Taking? Authorizing Provider  acetaminophen (TYLENOL) 500 MG tablet Take 500-1,000 mg by mouth every 6 (six) hours as needed (pain.).   Yes [provider]  albuterol (VENTOLIN HFA) 108 (90 Base) MCG/ACT inhaler Inhale 2 puffs into the lungs every 6 (six) hours as needed for wheezing or shortness of breath. 08/04/21  Yes BCollene Gobble MD  ammonium lactate (LAC-HYDRIN) 12 % lotion Apply 1 application topically as needed for dry skin.   Yes [provider]  Ascorbic Acid (VITAMIN C GUMMIE PO) Take 2 tablets by mouth daily at 12 noon.   Yes [provider]  aspirin EC 81 MG tablet Take 81 mg by mouth in the morning.   Yes [provider]  Cholecalciferol (VITAMIN D3) 50 MCG (2000 UT) TABS Take 2,000 Units by mouth daily at 12 noon.   Yes [provider]  Coenzyme Q10-Vitamin E (QUNOL ULTRA COQ10 PO) Take 1 capsule by mouth in the morning.   Yes [provider]  dapagliflozin propanediol (FARXIGA) 10 MG TABS tablet Take 1 tablet (10 mg total) by mouth daily before breakfast. 10/14/21  Yes [place], JMaricela Bo FNP  famotidine (PEPCID) 20 MG tablet TAKE 1  TABLET(20 MG) BY MOUTH DAILY Patient taking differently: Take 20 mg by mouth daily. 12/29/21  Yes Saguier, Percell Miller, PA-C  ferrous sulfate 325 (65 FE) MG EC tablet Take 325 mg by mouth every other day.   Yes [provider]  KRILL OIL PO Take 800 mg by mouth every other day. In the morning   Yes [provider]  Magnesium 200 MG CHEW Chew 400 mg by mouth daily at 12 noon.   Yes [provider]  metolazone (ZAROXOLYN) 2.5 MG tablet Take 1 tablet (2.5 mg total) by mouth as needed (for fluid). 11/09/21  Yes Lyda Jester M, PA-C  midodrine (PROAMATINE) 5 MG tablet  Take 3 tablets (15 mg total) by mouth 3 (three) times daily with meals. 12/09/21  Yes Bensimhon, Shaune Pascal, MD  potassium chloride (KLOR-CON M) 10 MEQ tablet Take 40 mEq by mouth 2 (two) times daily. 01/02/22  Yes [provider]  rosuvastatin (CRESTOR) 5 MG tablet TAKE 1 TABLET(5 MG) BY MOUTH DAILY Patient taking differently: Take 5 mg by mouth daily. 10/05/21  Yes Bensimhon, Shaune Pascal, MD  spironolactone (ALDACTONE) 25 MG tablet Take 1 tablet (25 mg total) by mouth daily. 11/02/21  Yes Bensimhon, Shaune Pascal, MD  tadalafil, PAH, (ADCIRCA) 20 MG tablet Take 40 mg by mouth in the morning.   Yes [provider]  torsemide (DEMADEX) 20 MG tablet Take 2 tablets (40 mg total) by mouth 2 (two) times daily. Patient taking differently: Take 40 mg by mouth 2 (two) times daily. Takes with spironolactone 11/09/21  Yes Simmons, Brittainy M, PA-C  Turmeric 500 MG TABS Take 1 capsule by mouth daily in the afternoon. With Ginger 50 mg   Yes [provider]  vitamin B-12 (CYANOCOBALAMIN) 1000 MCG tablet Take 1,000 mcg by mouth daily at 12 noon.   Yes [provider]  warfarin (COUMADIN) 5 MG tablet TAKE 1/2 DAILY DAILY AS DIRECTED BY COUMADIN CLINIC Patient taking differently: Take 2.5-5 mg by mouth See admin instructions. Pt takes a full tablet 2 days a week and 1/2 tablet 5 days a week. 10/05/21  Yes Clegg, Amy D, NP  Zinc 50 MG TABS Take 50 mg by mouth every other day. In the afternoon   Yes [provider]  ondansetron (ZOFRAN) 8 MG tablet Take 1 tablet (8 mg total) by mouth every 8 (eight) hours as needed for nausea or vomiting. Patient not taking: Reported on 01/24/2022 12/20/21   Marrian Salvage, FNP  tiZANidine (ZANAFLEX) 2 MG tablet Take 1 tablet (2 mg total) by mouth 3 (three) times daily. Patient not taking: Reported on 01/24/2022 12/15/21 12/15/22  Pete Pelt, PA-C   Allergies  Allergen Reactions   Augmentin [Amoxicillin-Pot Clavulanate] Anaphylaxis  and Diarrhea    "Upset stomach"   Pantoprazole Sodium Nausea Only    Gets gassy and starting itching like crazy Gets gassy and starting itching like crazy   Valium [Diazepam] Other (See Comments)    HA and Abd pain.   Tape Rash and Other (See Comments)    Surgical tape   Wound Dressing Adhesive Other (See Comments) and Rash    Surgical tape Surgical tape Surgical tape   Review of Systems  Constitutional:  Positive for activity change, appetite change, fatigue and unexpected weight change.  Respiratory:  Positive for shortness of breath.   Gastrointestinal:  Negative for nausea and vomiting.  Neurological:  Positive for weakness.  All other systems reviewed and are negative.  Physical Exam Vitals  and nursing note reviewed.  Constitutional:      General: He is not in acute distress.    Appearance: He is ill-appearing.     Comments: Frail appearing  Pulmonary:     Effort: No respiratory distress.  Skin:    General: Skin is warm and dry.  Neurological:     Mental Status: He is alert and oriented to person, place, and time.     Motor: Weakness present.  Psychiatric:        Attention and Perception: Attention normal.        Behavior: Behavior is cooperative.        Cognition and Memory: Cognition and memory normal.    Vital Signs: BP 108/88 (BP Location: Left Arm)    Pulse 66    Temp 97.6 F (36.4 C) (Oral)    Resp 20    Ht 5' 9" (1.753 m)    Wt 57.2 kg    SpO2 98%    BMI 18.62 kg/m  Pain Scale: 0-10   Pain Score: 0-No pain   SpO2: SpO2: 98 % O2 Device:SpO2: 98 % O2 Flow Rate: .O2 Flow Rate (L/min): 5 L/min  IO: Intake/output summary:  Intake/Output Summary (Last 24 hours) at 01/25/2022 1543 Last data filed at 01/25/2022 1452 Gross per 24 hour  Intake 1129.88 ml  Output 5550 ml  Net -4420.12 ml    LBM: Last BM Date: 01/23/22 Baseline Weight: Weight: 59.4 kg Most recent weight: Weight: 57.2 kg     Palliative Assessment/Data: PPS 40%     Time In: 1500 Time  Out: 1615 Time Total: 75 minutes  Greater than 50%  of this time was spent counseling and coordinating care related to the above assessment and plan.  Signed by: Lin Landsman, NP   Please contact Palliative Medicine Team phone at [phone] for questions and concerns.  For individual provider: See Shea Evans

## 2022-01-25 NOTE — Evaluation (Signed)
Occupational Therapy Evaluation Patient Details Name: Lance Hicks MRN: [id number] DOB: 1946-09-25 Today's Date: 01/25/2022   History of Present Illness Patient is a 76 yo male presenting to ED on 1/30 with increased LE edema and shortness of breath. Patient with recent admit to [facility] with  pneumonia and accidental ingestion of feul injection fluid, but leaving AMA. PHM includes: a fib, a flutter, chronic anticoagulation, chronic back pain, COPD, CAD, dyspnea, HTN, aortic valve replacement 2018, CABG 2018, HLD, kidney stones, leukocytoclastic vasculitis, pleural effusion, syncope, wrist and forearm fx surgery, and alcohol abuse.   Clinical Impression   Prior to this admission, patient was living alone and driving, and getting a little assistance from church friends to take care of his horses and provide him meals. Patient endorses he is able to do some of the cooking if he has to, and was independent with his ADLs. Patient currently demonstrates difficulty with overall activity tolerance, and is min A for bed mobility and ADLs. Patient is also currently unsteady on feet without the use of an assistive device. Patient is adamant against SNF, but agreeable to having home health therapy. Patient would benefit from skilled OT services acutely in order to address functional deficits listed below. OT recommendation is for HHOT once medically stable as patient is arranging for assistance post discharge from friends.      Recommendations for follow up therapy are one component of a multi-disciplinary discharge planning process, led by the attending physician.  Recommendations may be updated based on patient status, additional functional criteria and insurance authorization.   Follow Up Recommendations  Home health OT    Assistance Recommended at Discharge Intermittent Supervision/Assistance  Patient can return home with the following A little help with walking and/or transfers;A little help  with bathing/dressing/bathroom;Assistance with cooking/housework;Assist for transportation    Functional Status Assessment  Patient has had a recent decline in their functional status and demonstrates the ability to make significant improvements in function in a reasonable and predictable amount of time.  Equipment Recommendations  BSC/3in1    Recommendations for Other Services       Precautions / Restrictions Precautions Precautions: Fall Precaution Comments: monitor O2 sats Restrictions Weight Bearing Restrictions: No      Mobility Bed Mobility Overal bed mobility: Needs Assistance Bed Mobility: Supine to Sit, Sit to Supine     Supine to sit: Min assist Sit to supine: Min assist   General bed mobility comments: extra time and use of hand rails needed, minimal steadying at torso to come into sitting, able to bring legs back into bed with extra time and effort    Transfers Overall transfer level: Needs assistance Equipment used: 1 person hand held assist Transfers: Sit to/from Stand Sit to Stand: Min assist           General transfer comment: HHA used due to no walker present, would likely be supervision if RW was in front of him, able to ambulate as few steps forward and back but needed steadying from therapist      Balance Overall balance assessment: Needs assistance Sitting-balance support: Feet supported Sitting balance-Leahy Scale: Fair     Standing balance support: Single extremity supported, During functional activity Standing balance-Leahy Scale: Poor Standing balance comment: Would benefit from BUE support                           ADL either performed or assessed with clinical judgement   ADL  Overall ADL's : Needs assistance/impaired Eating/Feeding: Independent   Grooming: Set up   Upper Body Bathing: Set up   Lower Body Bathing: Minimal assistance;Sitting/lateral leans   Upper Body Dressing : Set up   Lower Body Dressing:  Minimal assistance;Sit to/from stand   Toilet Transfer: Minimal assistance;Cueing for sequencing;Cueing for safety;Ambulation   Toileting- Clothing Manipulation and Hygiene: Minimal assistance       Functional mobility during ADLs: Minimal assistance;Cueing for safety;Cueing for sequencing;Rolling walker (2 wheels) General ADL Comments: Patient presents with decreased activity tolerance and endurance.     Vision Baseline Vision/History: 1 Wears glasses (readers) Ability to See in Adequate Light: 0 Adequate Patient Visual Report: No change from baseline       Perception     Praxis      Pertinent Vitals/Pain Pain Assessment Pain Assessment: No/denies pain     Hand Dominance Right   Extremity/Trunk Assessment Upper Extremity Assessment Upper Extremity Assessment: Overall WFL for tasks assessed   Lower Extremity Assessment Lower Extremity Assessment: Defer to PT evaluation   Cervical / Trunk Assessment Cervical / Trunk Assessment: Kyphotic   Communication Communication Communication: No difficulties   Cognition Arousal/Alertness: Awake/alert Behavior During Therapy: WFL for tasks assessed/performed Overall Cognitive Status: No family/caregiver present to determine baseline cognitive functioning                                 General Comments: Will assess congition in higher context, but alert and oriented, and able to plan for upcoming discharge     General Comments       Exercises     Shoulder Instructions      Home Living Family/patient expects to be discharged to:: Private residence Living Arrangements: Alone Available Help at Discharge: Friend(s);Available PRN/intermittently Type of Home: House Home Access: Stairs to enter [employer] of Steps: 1 Entrance Stairs-Rails: Can reach both Home Layout: One level     Bathroom Shower/Tub: Tub/shower unit;Curtain   Bathroom Toilet: Handicapped height     Home Equipment: Chartered certified accountant (2 wheels);Grab bars - tub/shower;Toilet riser          Prior Functioning/Environment Prior Level of Function : Needs assist;Independent/Modified Independent             Mobility Comments: requires help with his animals and housework normally          OT Problem List: Decreased strength;Decreased activity tolerance;Impaired balance (sitting and/or standing);Decreased safety awareness;Cardiopulmonary status limiting activity;Increased edema      OT Treatment/Interventions: Self-care/ADL training;Therapeutic exercise;Energy conservation;DME and/or AE instruction;Therapeutic activities;Patient/family education    OT Goals(Current goals can be found in the care plan section) Acute Rehab OT Goals Patient Stated Goal: To get better and walk out of here OT Goal Formulation: With patient Time For Goal Achievement: 02/08/22 Potential to Achieve Goals: Fair  OT Frequency: Min 2X/week    Co-evaluation              AM-PAC OT "6 Clicks" Daily Activity     Outcome Measure Help from another person eating meals?: None Help from another person taking care of personal grooming?: A Little Help from another person toileting, which includes using toliet, bedpan, or urinal?: A Little Help from another person bathing (including washing, rinsing, drying)?: A Little Help from another person to put on and taking off regular upper body clothing?: A Little Help from another person to put on and taking off regular lower body clothing?:  A Little 6 Click Score: 19   End of Session Equipment Utilized During Treatment: Gait belt Nurse Communication: Mobility status  Activity Tolerance: Patient limited by fatigue Patient left: in bed;with call bell/phone within reach;with bed alarm set  OT Visit Diagnosis: Unsteadiness on feet (R26.81);Other abnormalities of gait and mobility (R26.89);Muscle weakness (generalized) (M62.81)                Time: 7998-7215 OT Time Calculation (min): 26  min Charges:  OT General Charges $OT Visit: 1 Visit OT Evaluation $OT Eval Moderate Complexity: 1 Mod OT Treatments $Self Care/Home Management : 8-22 mins  Corinne Ports E. Ilee Randleman, OTR/L Acute Rehabilitation Services [phone] Blanket 01/25/2022, 10:11 AM

## 2022-01-25 NOTE — TOC Initial Note (Signed)
Transition of Care [facility]) - Initial/Assessment Note    Patient Details  Name: Lance Hicks MRN: [id number] Date of Birth: 07-Apr-1946  [facility]) CM/SW Contact:    Rosabel Sermeno, LCSW Phone Number: 01/25/2022, 10:02 AM  Clinical Narrative:                 HF CSW spoke with Mr. Mittleman at bedside regarding the SNF consult however he reported "I'm not going to rehab and I don't want to talk about it again." Mr. Aguas reported that he wants to go home with home health and that he has neighbors, church friends and works with the [employer] and will have a Mennonite maybe stay with him to help provide assistance at home. Mr. Gaba reported he would like the home health agency to come out 2 or 3 times a week for assistance. CSW notified the HF RNCM to arrange home health for Mr. Mehring as he is adamantly refusing SNF.  CSW will continue to follow throughout discharge.  Expected Discharge Plan: [place] Barriers to Discharge: Continued Medical Work up   Patient Goals and CMS Choice Patient states their goals for this hospitalization and ongoing recovery are:: refusing SNF wants to return home CMS Medicare.gov Compare Post Acute Care list provided to:: Patient Choice offered to / list presented to : Patient  Expected Discharge Plan and Services Expected Discharge Plan: [place] In-house Referral: Clinical Social Work Discharge Planning Services: CM Consult   Living arrangements for the past 2 months: [place]                                      Prior Living Arrangements/Services Living arrangements for the past 2 months: Single Family Home Lives with:: Self Patient language and need for interpreter reviewed:: Yes Do you feel safe going back to the place where you live?: Yes      Need for Family Participation in Patient Care: No (Comment) Care giver support system in place?: No (comment)   Criminal Activity/Legal  Involvement Pertinent to Current Situation/Hospitalization: No - Comment as needed  Activities of Daily Living      Permission Sought/Granted Permission sought to share information with : Case Manager Permission granted to share information with : Yes, Verbal Permission Granted     Permission granted to share info w AGENCY: home health agencies        Emotional Assessment Appearance:: Appears stated age Attitude/Demeanor/Rapport: Engaged Affect (typically observed): Pleasant Orientation: : Oriented to Self, Oriented to Place, Oriented to  Time, Oriented to Situation   Psych Involvement: No (comment)  Admission diagnosis:  Pulmonary hypertension (HCC) [Y80.16] Acute systolic CHF (congestive heart failure) ([place]) [I50.21] Acute on chronic congestive heart failure, unspecified heart failure type ([place]) [I50.9] Patient Active Problem List   Diagnosis Date Noted   Acute systolic CHF (congestive heart failure) ([place]) 01/23/2022   Acute on chronic diastolic congestive heart failure, NYHA class 3 ([place]) 11/03/2021   ILD (interstitial lung disease) ([place]) 08/04/2021   Fall 06/21/2021   Asthma    Pneumonia due to COVID-19 virus 02/07/2021   Cirrhosis of liver not due to alcohol ([place]) 07/01/2019   Anemia 07/01/2019   Hypotension, chronic 06/06/2018   Enterococcal bacteremia    Pulmonary edema    Strain of deltoid muscle, initial encounter    Pulmonary arterial hypertension ([place]) 02/20/2018  Hypokalemia due to excessive renal loss of potassium 02/18/2018   Left ureteral stone 01/23/2018   Anticoagulated on Coumadin 01/04/2018   Localized edema 01/04/2018   Leukocytoclastic vasculitis ([place]) 10/01/2017   Hypersensitivity angiitis ([place]) 10/01/2017   Maculopapular rash 09/03/2017   Epidermoid cyst of skin 08/24/2017   Bilateral pleural effusion 08/09/2017   Pleural effusion, bilateral 08/09/2017   Supratherapeutic INR 07/26/2017   International normalized ratio (INR) raised 07/26/2017    Microscopic hematuria 06/20/2017   Asymptomatic microscopic hematuria 06/20/2017   Atrial flutter ([place]) 03/29/2017   Coronary artery disease involving native coronary artery of native heart with angina pectoris ([place]) 03/29/2017   H/O maze procedure 03/29/2017   History of coronary artery bypass graft 03/29/2017   Hypertensive heart disease with heart failure ([place]) 03/29/2017   Hyperlipidemia 03/29/2017   Coronary arteriosclerosis in native artery 03/29/2017   Long term (current) use of anticoagulants 03/29/2017   Non-sustained ventricular tachycardia 03/29/2017   Cough 10/17/2016   Pulmonary nodules 06/12/2016   Multiple nodules of lung 06/12/2016   COPD (chronic obstructive pulmonary disease) ([place]) 06/12/2016   Anxiety 05/10/2016   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   Allergic rhinitis 04/28/2016   [name] 03/20/2016   S/P AVR (aortic valve replacement) 03/20/2016   Dyspnea 02/01/2016   Syncope and collapse 02/01/2016   Lumbar radicular pain 01/19/2016   Lumbar radiculopathy 01/19/2016   Backache 12/14/2015   Essential hypertension 12/14/2015   Chronic midline back pain 12/14/2015   Chronic prostatitis 07/23/2015   Nephrolithiasis 07/23/2015   Chronic atrial fibrillation ([place]) 07/23/2015   PCP:  [name], PA-C Pharmacy:   [facility] [place], [address] AT [place] [address] [place] [address] Phone: [phone] Fax: [phone]     Social Determinants of Health (SDOH) Interventions Food Insecurity Interventions: Intervention Not Indicated Financial Strain Interventions: Intervention Not Indicated Housing Interventions: Intervention Not Indicated  Readmission Risk Interventions No flowsheet data found.  Shonta Bourque, MSW, LCSW [phone] Heart Failure Social Worker

## 2022-01-25 NOTE — Assessment & Plan Note (Addendum)
CKD stage 3a/ hyponatremia. Hypomagnesemia  Patient with improved volume status, his renal function at the time of discharge is 1,52, from a peak of 1,80, his Na is 126 and K is 4,5 with a serum bicarbonate at 28.  Plan to continue diuresis with torsemide and follow up renal function in 7 days. Continue with unrestricted diet and protein supplementation.   Dose of torsemide has been decreased, no diuretic therapy today and start on 02/06/22.

## 2022-01-25 NOTE — Assessment & Plan Note (Addendum)
Anemia panel with iron 164, TIBC 360, ferritin 38, folate 29.1, B12 2080.    Continue iron supplementation

## 2022-01-25 NOTE — Assessment & Plan Note (Addendum)
Continue anticoagulation with warfarin, currently is rate controlled.  Patient not on AV blockade at this point in time.

## 2022-01-26 ENCOUNTER — Inpatient Hospital Stay (HOSPITAL_COMMUNITY): Payer: Medicare Other

## 2022-01-26 DIAGNOSIS — I5043 Acute on chronic combined systolic (congestive) and diastolic (congestive) heart failure: Secondary | ICD-10-CM | POA: Diagnosis not present

## 2022-01-26 DIAGNOSIS — I5021 Acute systolic (congestive) heart failure: Secondary | ICD-10-CM | POA: Diagnosis not present

## 2022-01-26 DIAGNOSIS — I9589 Other hypotension: Secondary | ICD-10-CM | POA: Diagnosis not present

## 2022-01-26 DIAGNOSIS — I482 Chronic atrial fibrillation, unspecified: Secondary | ICD-10-CM | POA: Diagnosis not present

## 2022-01-26 DIAGNOSIS — Z952 Presence of prosthetic heart valve: Secondary | ICD-10-CM | POA: Diagnosis not present

## 2022-01-26 DIAGNOSIS — E876 Hypokalemia: Secondary | ICD-10-CM

## 2022-01-26 LAB — CBC
HCT: 33.7 % — ABNORMAL LOW (ref 39.0–52.0)
Hemoglobin: 11 g/dL — ABNORMAL LOW (ref 13.0–17.0)
MCH: 25.5 pg — ABNORMAL LOW (ref 26.0–34.0)
MCHC: 32.6 g/dL (ref 30.0–36.0)
MCV: 78 fL — ABNORMAL LOW (ref 80.0–100.0)
Platelets: 135 10*3/uL — ABNORMAL LOW (ref 150–400)
RBC: 4.32 MIL/uL (ref 4.22–5.81)
RDW: 20.5 % — ABNORMAL HIGH (ref 11.5–15.5)
WBC: 4.2 10*3/uL (ref 4.0–10.5)
nRBC: 0 % (ref 0.0–0.2)

## 2022-01-26 LAB — IRON AND TIBC
Iron: 164 ug/dL (ref 45–182)
Saturation Ratios: 46 % — ABNORMAL HIGH (ref 17.9–39.5)
TIBC: 360 ug/dL (ref 250–450)
UIBC: 196 ug/dL

## 2022-01-26 LAB — RETICULOCYTES
Immature Retic Fract: 8.8 % (ref 2.3–15.9)
RBC.: 4.24 MIL/uL (ref 4.22–5.81)
Retic Count, Absolute: 51.3 10*3/uL (ref 19.0–186.0)
Retic Ct Pct: 1.2 % (ref 0.4–3.1)

## 2022-01-26 LAB — BASIC METABOLIC PANEL
Anion gap: 11 (ref 5–15)
BUN: 17 mg/dL (ref 8–23)
CO2: 27 mmol/L (ref 22–32)
Calcium: 9.6 mg/dL (ref 8.9–10.3)
Chloride: 87 mmol/L — ABNORMAL LOW (ref 98–111)
Creatinine, Ser: 1.25 mg/dL — ABNORMAL HIGH (ref 0.61–1.24)
GFR, Estimated: >60 mL/min (ref >60)
Glucose, Bld: 104 mg/dL — ABNORMAL HIGH (ref 70–99)
Potassium: 3.5 mmol/L (ref 3.5–5.1)
Sodium: 125 mmol/L — ABNORMAL LOW (ref 135–145)

## 2022-01-26 LAB — GLUCOSE, CAPILLARY
Glucose-Capillary: 99 mg/dL (ref 70–99)
Glucose-Capillary: 129 mg/dL — ABNORMAL HIGH (ref 70–99)
Glucose-Capillary: 110 mg/dL — ABNORMAL HIGH (ref 70–99)
Glucose-Capillary: 135 mg/dL — ABNORMAL HIGH (ref 70–99)

## 2022-01-26 LAB — PROTIME-INR
INR: 2.7 — ABNORMAL HIGH (ref 0.8–1.2)
Prothrombin Time: 29 seconds — ABNORMAL HIGH (ref 11.4–15.2)

## 2022-01-26 LAB — FERRITIN: Ferritin: 38 ng/mL (ref 24–336)

## 2022-01-26 LAB — MAGNESIUM: Magnesium: 1.6 mg/dL — ABNORMAL LOW (ref 1.7–2.4)

## 2022-01-26 LAB — VITAMIN B12: Vitamin B-12: 2080 pg/mL — ABNORMAL HIGH (ref 180–914)

## 2022-01-26 LAB — FOLATE: Folate: 29.1 ng/mL (ref >5.9)

## 2022-01-26 MED ORDER — METHOCARBAMOL 500 MG PO TABS
500.0000 mg | ORAL_TABLET | Freq: Three times a day (TID) | ORAL | Status: DC | PRN
  Administered 2022-01-27 – 2022-01-31 (×3): 500 mg via ORAL
  Filled 2022-01-26 (×4): qty 1

## 2022-01-26 MED ORDER — WARFARIN SODIUM 2.5 MG PO TABS
2.5000 mg | ORAL_TABLET | Freq: Once | ORAL | Status: AC
  Administered 2022-01-26: 2.5 mg via ORAL
  Filled 2022-01-26: qty 1

## 2022-01-26 MED ORDER — POTASSIUM CHLORIDE CRYS ER 20 MEQ PO TBCR
40.0000 meq | EXTENDED_RELEASE_TABLET | Freq: Once | ORAL | Status: AC
  Administered 2022-01-26: 40 meq via ORAL
  Filled 2022-01-26: qty 2

## 2022-01-26 MED ORDER — MAGNESIUM SULFATE 4 GM/100ML IV SOLN
4.0000 g | Freq: Once | INTRAVENOUS | Status: AC
  Administered 2022-01-26: 4 g via INTRAVENOUS
  Filled 2022-01-26 (×2): qty 100

## 2022-01-26 MED ORDER — DOCUSATE SODIUM 100 MG PO CAPS
200.0000 mg | ORAL_CAPSULE | Freq: Two times a day (BID) | ORAL | Status: DC
  Administered 2022-01-26 – 2022-02-05 (×14): 200 mg via ORAL
  Filled 2022-01-26 (×20): qty 2

## 2022-01-26 MED ORDER — WARFARIN SODIUM 1 MG PO TABS
1.0000 mg | ORAL_TABLET | Freq: Once | ORAL | Status: DC
  Filled 2022-01-26: qty 1

## 2022-01-26 MED ORDER — GUAIFENESIN-DM 100-10 MG/5ML PO SYRP
5.0000 mL | ORAL_SOLUTION | ORAL | Status: DC | PRN
  Administered 2022-01-26 – 2022-02-04 (×5): 5 mL via ORAL
  Filled 2022-01-26 (×5): qty 5

## 2022-01-26 MED ORDER — TRAZODONE HCL 50 MG PO TABS
50.0000 mg | ORAL_TABLET | Freq: Every day | ORAL | Status: DC
  Administered 2022-01-26 – 2022-02-04 (×10): 50 mg via ORAL
  Filled 2022-01-26 (×11): qty 1

## 2022-01-26 NOTE — Plan of Care (Signed)
Patient ID: Lance Hicks, male   DOB: December 18, 1946, 76 y.o.   MRN: [id number]   Problem: Education: Goal: Ability to demonstrate management of disease process will improve Outcome: Progressing Goal: Ability to verbalize understanding of medication therapies will improve Outcome: Progressing Goal: Individualized Educational Video(s) Outcome: Progressing   Problem: Activity: Goal: Capacity to carry out activities will improve Outcome: Progressing   Problem: Cardiac: Goal: Ability to achieve and maintain adequate cardiopulmonary perfusion will improve Outcome: Progressing   Problem: Metabolic: Goal: Ability to maintain appropriate glucose levels will improve Outcome: Progressing   Problem: Education: Goal: Knowledge of disease or condition will improve Outcome: Progressing   Problem: Health Behavior/Discharge Planning: Goal: Ability to identify changes in lifestyle to reduce recurrence of condition will improve Outcome: Progressing   Problem: Physical Regulation: Goal: Complications related to the disease process, condition or treatment will be avoided or minimized Outcome: Progressing   Problem: Safety: Goal: Ability to remain free from injury will improve Outcome: Progressing   Haydee Salter, RN

## 2022-01-26 NOTE — Progress Notes (Signed)
This chaplain responded to PMT consult for creating HCPOA.   The chaplain was updated by the Pt. RN-Catrina before the visit. The chaplain understands the Pt. prefers to rest. This chaplain will F/U on Friday morning.  Chaplain Sallyanne Kuster [phone]

## 2022-01-26 NOTE — Progress Notes (Signed)
Patient ID: Lance Hicks, male   DOB: Aug 26, 1946, 76 y.o.   MRN: [id number] Upon assessment, patient's skin color appeared to have yellowish tint as well as his eyes. Notified MD. Liver panel ordered.  Haydee Salter, RN '

## 2022-01-26 NOTE — Progress Notes (Signed)
Progress Note   Patient: Lance Hicks DTO:[id number] DOB: Apr 26, 1946 DOA: 01/23/2022     3 DOS: the patient was seen and examined on 01/26/2022   Brief hospital course: NYAIR DEPAULO is a 76 year old male with past medical history significant for chronic systolic congestive heart failure NYHA class III, essential pretension, CAD s/p CABG, aortic stenosis s/p aortic valve replacement on Coumadin, orthostatic hypotension, COPD, pulmonary hypertension, type 2 diabetes mellitus, paroxysmal atrial fibrillation, cirrhosis secondary to EtOH abuse who presented to [facility] ED on 1/30 with progressive lower extremity edema.  Recently hospitalized at [facility] for pneumonia; treated with antibiotics in which his Lasix was held for 3 days for "low blood pressure".  Discharged home and patient started noticed increased swelling of his lower legs and he resumed his Lasix for the past 3 days however with no improvement.  Patient also reporting exertional dyspnea and was noted by his friend to be "very winded" and sent him to the hospital for further evaluation.  Denies cough, no fever/chills.  In the ED, BP was slightly hypotensive, found to be hypoxic and placed on 3 L nasal cannula.  Chest x-ray with bilateral pulmonary congestion.  Cardiology consulted and patient was given 80 mg IV Lasix.  Hospital service consulted for further evaluation management.   Assessment and Plan: Acute on chronic combined systolic and diastolic CHF (congestive heart failure) ([place])- (present on admission) Patient presenting to ED with progressive shortness of breath in the setting of his home Lasix.  Patient was found to be hypoxic requiring submental oxygen to maintain adequate SPO2.  BNP elevated 1831.  Chest x-ray with pulmonary edema, bilateral effusions greater on right. --Cardiology following, appreciate assistance --net negative 3.0L past 24h and net negative 8.1L since admission --Wt 59.4>57.2>55.5kg --Furosemide  drip --Milrinone - on hold for SBP in the 80s overnight --Midodrine 15 mg p.o. TID --Spironolactone 25 mg p.o. daily --Supplemental oxygen remains off --Strict I's and O's and daily weights --Monitor renal function closely daily --Overall poor prognosis, seen by palliative care on 2/1 for assistance with goals of care and medical decision making, now DNR but plan to continue aggressive care with plan to return home with PT/OT  Chronic atrial fibrillation ([place])- (present on admission) Rate controlled, not on beta-blocker/CCB outpatient. --Continue Coumadin, pharmacy consulted for dosing/monitoring (INR goal 2.5-3.5) --INR 2.7 today --continue to monitor  [name] --Continue Coumadin, pharmacy consulted for dosing/monitoring --Goal INR 2.5-3.5; 3.5 today --Continue monitor INR daily  Hypotension, chronic- (present on admission) --Continue midodrine 15 mg p.o. TID  Hyperlipidemia- (present on admission) --Crestor 5 mg p.o. daily  GERD (gastroesophageal reflux disease)- (present on admission) --Protonix 40 mg p.o. daily  Alcohol abuse- (present on admission) Counseled on need for cessation. --CIWA protocol with symptom triggered Ativan --Thiamine, multivitamin, folic acid  Iron deficiency anemia- (present on admission) Anemia panel with iron 164, TIBC 360, ferritin 38, folate 29.1, B12 2080. --Ferrous sulfate 325 mg p.o. every other day  Hyponatremia- (present on admission) Sodium 131 on admission, etiology likely secondary to hypervolemic hyponatremia in the setting of volume overload/CHF exacerbation as above. --Na 131>>>125 --IV lasix and spironolactone as above --BMP daily  Pulmonary arterial hypertension ([place])- (present on admission) Home regimen includes tadalafil 40 mg p.o. every morning. --Selexipag 228mg PO BID  Physical deconditioning- (present on admission) Patient with significant weakness, debility, gait disturbance and  deconditioning from recurrent hospitalization.  PT/OT currently recommends SNF placement, patient declines and states has many friends including the [employer]  community will assist him when he returns home. --Continue PT/OT efforts while inpatient, anticipate home with home health when ready for discharge  Hypokalemia, Hypomagnesmia Potassium 3.5, magnesium 1.6 this morning, will replete. --Continue to follow electrolytes daily        Subjective:  Patient seen examined at bedside, resting comfortably.  Cardiology/heart failure PA present.  Patient states overall feeling better but complaining of pain and continued swelling to his lower extremities, although they look pretty improved since initial presentation.  Seen by palliative care yesterday, now DNR but wants to continue other aggressive therapies and plan for home with home health.  Milrinone drip discontinued overnight for drop in systolic blood pressures to the 80s.  Discussed with cardiology PA bedside, will await further recommendations from Dr. [name] but we will continue off the milrinone drip for now.  Patient denies headache, no dizziness, no chest pain, no palpitations, no shortness of breath, no abdominal pain, no fatigue, no paresthesias.  No other acute events overnight per nursing staff.  Physical Exam: Vitals:   01/26/22 0345 01/26/22 0610 01/26/22 0630 01/26/22 0900  BP: (!) 90/58 (!) 78/56 90/72 (!) 85/58  Pulse:  (!) 58 (!) 55 (!) 59  Resp: 19     Temp: 97.6 F (36.4 C)     TempSrc: Oral     SpO2: 97% 96% 96% 97%  Weight: 55.5 kg     Height:       Physical Exam GEN: 76 yo male in NAD, alert and oriented x 3, chronically ill/cachectic in appearance HEENT: NCAT, PERRL, EOMI, sclera clear, MMM PULM: Breath sounds slight decreased bilateral bases, otherwise CTAB w/o wheezes/crackles, normal respiratory effort, on room air CV: Irregularly irregular rhythm, normal rate w/o M/G/R, no JVD GI: abd soft, NTND, NABS, no  R/G/M MSK: Trace bilateral peripheral edema, muscle strength globally intact 5/5 bilateral upper/lower extremities NEURO: CN II-XII intact, no focal deficits, sensation to light touch intact PSYCH: normal mood/affect Integumentary: dry/intact, no rashes or wounds   Data Reviewed:  CBC, BMP and anemia panel reviewed.  Family Communication: No family present at bedside  Disposition: Status is: Inpatient Remains inpatient appropriate because: Continues on Lasix drip, awaiting further recommendations per cardiology.  Anticipate home with home health with outpatient palliative care to follow when ready for discharge.          Planned Discharge Destination: Home with Home Health     Time spent: 45 minutes spent on chart review, discussion with nursing staff, consultants, updating family and interview/physical exam; more than 50% of that time was spent in counseling and/or coordination of care.  Author: Donnamarie Poag [place] DO 01/26/2022 10:00 AM  For on call review [email or web address].

## 2022-01-26 NOTE — Assessment & Plan Note (Addendum)
Repleted during hospitalization.  Continue potassium supplementation 40 mg p.o. daily on discharge.

## 2022-01-26 NOTE — Progress Notes (Signed)
PT Cancellation Note  Patient Details Name: Lance Hicks MRN: [id number] DOB: 04/19/1946   Cancelled Treatment:    Reason Eval/Treat Not Completed: Fatigue/lethargy limiting ability to participate. Pt agitated upon therapist's entry to room. Stating "I can't get any rest. People have been in and out of here 50 times. My room is like a g** da** highway." Requesting therapist come back tomorrow.    Lorriane Shire 01/26/2022, 11:05 AM  Lorrin Goodell, PT  Office # [phone] Pager [phone]

## 2022-01-26 NOTE — Progress Notes (Signed)
ANTICOAGULATION CONSULT NOTE - Follow Up Consult  Pharmacy Consult for Warfarin Indication: mechanical AVR  Allergies  Allergen Reactions   Augmentin [Amoxicillin-Pot Clavulanate] Anaphylaxis and Diarrhea    "Upset stomach"   Pantoprazole Sodium Nausea Only    Gets gassy and starting itching like crazy Gets gassy and starting itching like crazy   Valium [Diazepam] Other (See Comments)    HA and Abd pain.   Tape Rash and Other (See Comments)    Surgical tape   Wound Dressing Adhesive Other (See Comments) and Rash    Surgical tape Surgical tape Surgical tape    Vital Signs: Temp: 97.9 F (36.6 C) (02/02 1515) Temp Source: Oral (02/02 1515) BP: 99/73 (02/02 1615) Pulse Rate: 51 (02/02 1615)  Labs: Recent Labs    01/24/22 0347 01/24/22 1624 01/25/22 0511 01/26/22 0244  HGB 11.3*  --  11.1* 11.0*  HCT 35.2*  --  33.0* 33.7*  PLT 121*  --  122* 135*  LABPROT 40.0*  --  33.3* 29.0*  INR 4.1*  --  3.3* 2.7*  CREATININE 1.05 1.42* 1.23 1.25*     Estimated Creatinine Clearance: 40.7 mL/min (A) (by C-G formula based on SCr of 1.25 mg/dL (H)).   Medications:  Scheduled:   aspirin EC  81 mg Oral q AM   docusate sodium  200 mg Oral Daily   famotidine  10 mg Oral Daily   ferrous sulfate  325 mg Oral QODAY   folic acid  1 mg Oral Daily   insulin aspart  0-9 Units Subcutaneous TID WC   melatonin  5 mg Oral QHS   midodrine  15 mg Oral TID WC   multivitamin with minerals  1 tablet Oral Daily   pantoprazole  40 mg Oral Daily   polyethylene glycol  17 g Oral Daily   potassium chloride  40 mEq Oral BID   rosuvastatin  5 mg Oral Daily   Selexipag  200 mcg Oral BID   sodium chloride flush  3 mL Intravenous Q12H   spironolactone  25 mg Oral Daily   thiamine  100 mg Oral Daily   Or   thiamine  100 mg Intravenous Daily   tiZANidine  2 mg Oral TID   warfarin  1 mg Oral Once   Warfarin - Pharmacist Dosing Inpatient   Does not apply q1600    Assessment: 76 y.o. male with  medical history significant of chronic systolic CHF NYHA class III, HTN, CAD and aortic stenosis status post CABG and mechanical AVR on Coumadin, orthostatic hypotension, COPD and pulmonary hypertension, IIDM, PAF, liver cirrhosis secondary to alcohol abuse, presented with increasing leg swelling.  Home regimen per patient: warfarin 56m MonWed and warfarin 2.570mall other days.   INR2.7 at goal this AM after lower doses - will try to prevent INR from dropping too low - resume pta dose CBC stable. No bleeding issues noted.   Goal of Therapy:  INR goal 2.5-3.0 (per clinic note) Monitor platelets by anticoagulation protocol: Yes   Plan:  Warfarin 2.5 mg tonight  Daily INR, s/s bleeding   LiBonnita Nasutiharm.D. CPP, BCPS Clinical Pharmacist [phone]/01/2022 5:03 PM

## 2022-01-26 NOTE — Progress Notes (Addendum)
Advanced Heart Failure Rounding Note  PCP-Cardiologist: None   Subjective:   1/31 Started on milrinone 0.25 mcg + lasix 20 mg+ metolazone.   Milrinone stopped again this morning due to SBP < 80. Remains off. Current SBP low 90s.   3.8L in UOP yesterday. Wt down 4 lb, 8 lb total.   SCr 1.42>>1.23>>1.25  Na 131>>126>>125. No confusion.   K 3.5 Mg 1.6   More alert today. Feels better. Breathing improved but he feels he "needs to get more fluid off". Not eating much.     Objective:   Weight Range: 55.5 kg Body mass index is 18.07 kg/m.   Vital Signs:   Temp:  [97.3 F (36.3 C)-98.5 F (36.9 C)] 97.6 F (36.4 C) (02/02 0345) Pulse Rate:  [55-77] 55 (02/02 0630) Resp:  [16-20] 19 (02/02 0345) BP: (78-113)/(56-88) 90/72 (02/02 0630) SpO2:  [95 %-98 %] 96 % (02/02 0630) Weight:  [55.5 kg] 55.5 kg (02/02 0345) Last BM Date: 01/25/22  Weight change: Filed Weights   01/24/22 1616 01/25/22 0433 01/26/22 0345  Weight: 59.4 kg 57.2 kg 55.5 kg    Intake/Output:   Intake/Output Summary (Last 24 hours) at 01/26/2022 0748 Last data filed at 01/26/2022 0342 Gross per 24 hour  Intake 759.28 ml  Output 3150 ml  Net -2390.72 ml      Physical Exam   General:  thin/cachectic appearing. No respiratory difficulty HEENT: normal Neck: supple. no JVD. Carotids 2+ bilat; no bruits. No lymphadenopathy or thyromegaly appreciated. Cor: PMI nondisplaced. Irregularly irregular rhythm. No rubs, gallops or murmurs. Lungs: decreased BS at the bases L>R  Abdomen: soft, nontender, nondistended. No hepatosplenomegaly. No bruits or masses. Good bowel sounds. Extremities: no cyanosis, clubbing, rash, trace b/l ankle edema  Neuro: alert & oriented x 3, cranial nerves grossly intact. moves all 4 extremities w/o difficulty. Affect pleasant.  Telemetry   Afib, 50s-70s, PVCs ~5/min. Personally reviewed   EKG    N/A  Labs    CBC Recent Labs    01/23/22 1145 01/24/22 0347  01/25/22 0511 01/26/22 0244  WBC 4.5   < > 3.6* 4.2  NEUTROABS 4.0  --   --   --   HGB 11.0*   < > 11.1* 11.0*  HCT 35.0*   < > 33.0* 33.7*  MCV 82.0   < > 78.9* 78.0*  PLT 132*   < > 122* 135*   < > = values in this interval not displayed.   Basic Metabolic Panel Recent Labs    01/25/22 0511 01/26/22 0244  NA 126* 125*  K 3.9 3.5  CL 94* 87*  CO2 23 27  GLUCOSE 94 104*  BUN 17 17  CREATININE 1.23 1.25*  CALCIUM 9.4 9.6  MG  --  1.6*   Liver Function Tests Recent Labs    01/24/22 0347  AST 27  ALT 12  ALKPHOS 54  BILITOT 2.0*  PROT 6.1*  ALBUMIN 2.7*   No results for input(s): LIPASE, AMYLASE in the last 72 hours. Cardiac Enzymes No results for input(s): CKTOTAL, CKMB, CKMBINDEX, TROPONINI in the last 72 hours.  BNP: BNP (last 3 results) Recent Labs    09/30/21 0241 01/12/22 1524 01/23/22 1145  BNP 840.7* 1,341.7* 1,831.8*    ProBNP (last 3 results) Recent Labs    07/04/21 1607 08/16/21 1620  PROBNP 425.0* 601.0*     D-Dimer No results for input(s): DDIMER in the last 72 hours. Hemoglobin A1C Recent Labs    01/24/22  0347  HGBA1C 5.5   Fasting Lipid Panel No results for input(s): CHOL, HDL, LDLCALC, TRIG, CHOLHDL, LDLDIRECT in the last 72 hours. Thyroid Function Tests No results for input(s): TSH, T4TOTAL, T3FREE, THYROIDAB in the last 72 hours.  Invalid input(s): FREET3  Other results:   Imaging    No results found.   Medications:     Scheduled Medications:  aspirin EC  81 mg Oral q AM   docusate sodium  200 mg Oral Daily   famotidine  10 mg Oral Daily   ferrous sulfate  325 mg Oral QODAY   folic acid  1 mg Oral Daily   insulin aspart  0-9 Units Subcutaneous TID WC   melatonin  5 mg Oral QHS   midodrine  15 mg Oral TID WC   multivitamin with minerals  1 tablet Oral Daily   pantoprazole  40 mg Oral Daily   polyethylene glycol  17 g Oral Daily   potassium chloride  40 mEq Oral BID   potassium chloride  40 mEq Oral Once    rosuvastatin  5 mg Oral Daily   Selexipag  200 mcg Oral BID   sodium chloride flush  3 mL Intravenous Q12H   spironolactone  25 mg Oral Daily   thiamine  100 mg Oral Daily   Or   thiamine  100 mg Intravenous Daily   tiZANidine  2 mg Oral TID   Warfarin - Pharmacist Dosing Inpatient   Does not apply q1600    Infusions:  sodium chloride     furosemide (LASIX) 200 mg in dextrose 5% 100 mL (73m/mL) infusion 10 mg/hr (01/26/22 0342)   magnesium sulfate bolus IVPB     milrinone Stopped (01/26/22 0615)    PRN Medications: sodium chloride, acetaminophen, LORazepam **OR** LORazepam, ondansetron (ZOFRAN) IV, ondansetron, polyvinyl alcohol, simethicone, sodium chloride flush    Patient Profile   76y/o old male with end-stage PAH/RV failure with chronic hypotension and hyponatremia. Also has CKD 3b, CAD s/p CABG/AVR.   Over past year multiple admits for volume overload and hyponatremia.    Recent had accidental ingestion of fuel injector cleaner. [facility]    Now presents with hypotension and volume overload   Assessment/Plan   1. A/C Biventricular Heart Failure, Predominant RV Failure   - Echo 10/22 EF 45-50% RV low normal  - Marked volume overload. BNP 1831. CXR with pulmonary edema.  - Brisk diuresis with milrinone 0.25 cmg + lasix drip + metolazone.  - Due to hypotension milrinone held 2/1 and again this morning  - Volume status much improved, wt down 8 lb. Can switch back to PO diuretics  - Renal function stable.     2. PAH  -WHO Group I & II - Auto-immune serologies negative (checked twice). - ? Component of HHT/shunt/AVMs with late bubbles on bubble study.  - Echo with bubble study (05/16/18): EF 55-60% with "late bubbles" , Mechanical AVR stable with trivial perivalvular regurg, Mild MR, Severe LAE, Severe RV dilation and reduced function. No PFO. Mod TR, PA peak pressure 68 mm Hg - Echo (4/22): EF 50-55%, RV ok, severe RVSP 61.6 mmHg,  severe biatrial enlargement, moderate TR, stable AVR, AV mean gradient 11 mmHg - Echo 10/22 EF 45-50 RV low normal  - Ab u/s with cirrhosis but no evidence of portal HTN. - Hold sildenafil and Macitentan w/ hypotension. Continue Uptravi.    3. Chronic A fib  - No longer on bb due to bradycardia.  -  INR 2.7.  - Coumadin per pharmacy    4. Hypotension  - in setting of RV failure - continue midodrine 15 tid   5. Hyponatremia  - in setting of end-stage HF  - Sodium 130-->126->125 - no confusion   - Volume improved w/ diuresis  - fluid restrict    6. PVCs, frequent  - Supp K and Mg (1.6)   7. CAD  - s/p CABG 07/2017 with Dr Roderic Palau in [place] - No chest pain    8. Leukocytoclastic Vasculitis - Followed by Rheumatology    9. Cirrhosis US Abdomen RUQ 05/16/18 - s/p cholecystectomy. + hepatic cirrhosis. Mild ascites.  - Liver Doppler 05/16/18 - No hepatic, splenic, or portal venous thrombosis or occlusion. Mild ascites   9. S/P Mechanical AVR - Coumadin held on admit w/ supratherapeutic INR (4.1) - INR down to 2.7 today  - Resume coumadin, dosing per pharmacy   10. Hypokalemia/Hypomagnesemia - K 3.5, Mg 1.6 - K and Mg Supp ordered   11. Pleural Effusions  - b/l effusion on admit CXR - exam suggest persistent effusions despite diuresis  - repeat CXR, may need therapeutic thoracentesis   12. GOC:  - End-stage HF - Palliative care following - C/w full scope medical tx for now  - DNR/DNI  - Agreeable w/ outpatient palliative care services  - Refuses SNF. Wants HH.    Length of Stay: [address] [place]hal Coria  01/26/2022, 7:48 AM  Advanced Heart Failure Team Pager [phone] (M-F; 7a - 5p)  Please contact [facility] for night-coverage after hours (5p -7a ) and weekends on [email or web address]  Patient seen and examined with the above-signed Advanced Practice Provider and/or Housestaff. I personally reviewed laboratory data, imaging studies and relevant notes. I independently  examined the patient and formulated the important aspects of the plan. I have edited the note to reflect any of my changes or salient points. I have personally discussed the plan with the patient and/or family.  Milrinone stopped due to hypotension (SBP 70s). Feels weak. Breathing better but says he still ahs to get more fluid off. Denies orthopnea or PND.   General:  Terminally ill appearing. No resp difficulty HEENT: normal Neck: supple. JVP to jaw. Carotids 2+ bilat; no bruits. No lymphadenopathy or thryomegaly appreciated. Cor: PMI nondisplaced. Irregular rate & rhythm. Mechanical s2 Lungs: clear Abdomen: soft, nontender, nondistended. No hepatosplenomegaly. No bruits or masses. Good bowel sounds. Extremities: no cyanosis, clubbing, rash, tr edema in thighs Neuro: alert & orientedx3, cranial nerves grossly intact. moves all 4 extremities w/o difficulty. Affect pleasant  He remains quite tenuous. Agree with stopping milrinone and continuing midodrine. Hold IV lasix for now. Can resume po torsemide tomorrow.  He is end-stage. Has seen Palliative Care but wants to continue aggressive care for now.   He is asking for something to help him sleep. Will give trazodone.  Glori Bickers, MD  6:31 PM

## 2022-01-26 NOTE — TOC Initial Note (Signed)
Transition of Care [facility]) - Initial/Assessment Note    Patient Details  Name: Lance Hicks MRN: [id number] Date of Birth: 31-Dec-1945  [facility] CM/SW Contact:    Erenest Rasher, RN Phone Number: [phone] 01/26/2022, 4:00 PM  Clinical Narrative:                Pt had [place] in the past for [facility]. [place]. Able to accept referral for [facility] and Palliative.   Expected Discharge Plan: [place] Barriers to Discharge: Continued Medical Work up   Patient Goals and CMS Choice Patient states their goals for this hospitalization and ongoing recovery are:: wants to be at home CMS Medicare.gov Compare Post Acute Care list provided to:: Patient Choice offered to / list presented to : Patient  Expected Discharge Plan and Services Expected Discharge Plan: [place] In-house Referral: Clinical Social Work Discharge Planning Services: CM Consult Post Acute Care Choice: [place] arrangements for the past 2 months: Single Family Home                           HH Arranged: RN, PT [place] Agency: [place] ([place]) Date [place]: 01/26/22 Time [place]: 22 Representative spoke with at [place]: [place] Arrangements/Services Living arrangements for the past 2 months: [place] Lives with:: Self Patient language and need for interpreter reviewed:: Yes Do you feel safe going back to the place where you live?: Yes      Need for Family Participation in Patient Care: No (Comment) Care giver support system in place?: No (comment)   Criminal Activity/Legal Involvement Pertinent to Current Situation/Hospitalization: No - Comment as needed  Activities of Daily Living      Permission Sought/Granted Permission sought to share information with : Case Manager, Family Supports, PCP Permission granted to share information  with : Yes, Verbal Permission Granted     Permission granted to share info w AGENCY: home health agencies        Emotional Assessment Appearance:: Appears stated age Attitude/Demeanor/Rapport: Engaged Affect (typically observed): Pleasant Orientation: : Oriented to Self, Oriented to Place, Oriented to  Time, Oriented to Situation   Psych Involvement: No (comment)  Admission diagnosis:  Pulmonary hypertension ([place]) [O35.00] Acute systolic CHF (congestive heart failure) ([place]) [I50.21] Acute on chronic congestive heart failure, unspecified heart failure type ([place]) [I50.9] Patient Active Problem List   Diagnosis Date Noted   Hypokalemia, Hypomagnesmia 01/26/2022   Physical deconditioning 01/25/2022   GERD (gastroesophageal reflux disease) 01/25/2022   Iron deficiency anemia 01/25/2022   Alcohol abuse 01/25/2022   Hyponatremia 93/81/8299   Acute systolic CHF (congestive heart failure) ([place]) 01/23/2022   Acute on chronic combined systolic and diastolic CHF (congestive heart failure) ([place]) 11/03/2021   ILD (interstitial lung disease) ([place]) 08/04/2021   Fall 06/21/2021   Asthma    Pneumonia due to COVID-19 virus 02/07/2021   Cirrhosis of liver not due to alcohol ([place]) 07/01/2019   Anemia 07/01/2019   Hypotension, chronic 06/06/2018   Enterococcal bacteremia    Pulmonary edema    Strain of deltoid muscle, initial encounter    Pulmonary arterial hypertension ([place]) 02/20/2018   Hypokalemia due to excessive renal loss of potassium 02/18/2018   Left ureteral stone 01/23/2018   Anticoagulated on Coumadin 01/04/2018   Localized edema 01/04/2018  Leukocytoclastic vasculitis ([place]) 10/01/2017   Hypersensitivity angiitis ([place]) 10/01/2017   Maculopapular rash 09/03/2017   Epidermoid cyst of skin 08/24/2017   Bilateral pleural effusion 08/09/2017   Pleural effusion, bilateral 08/09/2017   Supratherapeutic INR 07/26/2017   International normalized ratio (INR) raised 07/26/2017    Microscopic hematuria 06/20/2017   Asymptomatic microscopic hematuria 06/20/2017   Atrial flutter ([place]) 03/29/2017   Coronary artery disease involving native coronary artery of native heart with angina pectoris ([place]) 03/29/2017   H/O maze procedure 03/29/2017   History of coronary artery bypass graft 03/29/2017   Hypertensive heart disease with heart failure ([place]) 03/29/2017   Hyperlipidemia 03/29/2017   Coronary arteriosclerosis in native artery 03/29/2017   Long term (current) use of anticoagulants 03/29/2017   Non-sustained ventricular tachycardia 03/29/2017   Cough 10/17/2016   Pulmonary nodules 06/12/2016   Multiple nodules of lung 06/12/2016   COPD (chronic obstructive pulmonary disease) ([place]) 06/12/2016   Anxiety 05/10/2016   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   Allergic rhinitis 04/28/2016   [name] 03/20/2016   S/P AVR (aortic valve replacement) 03/20/2016   Dyspnea 02/01/2016   Syncope and collapse 02/01/2016   Lumbar radicular pain 01/19/2016   Lumbar radiculopathy 01/19/2016   Backache 12/14/2015   Essential hypertension 12/14/2015   Chronic midline back pain 12/14/2015   Chronic prostatitis 07/23/2015   Nephrolithiasis 07/23/2015   Chronic atrial fibrillation ([place]) 07/23/2015   PCP:  [name], PA-C Pharmacy:   [facility] [place], [address] AT [place] [address] [place] [address] Phone: [phone] Fax: [phone]     Social Determinants of Health (SDOH) Interventions Food Insecurity Interventions: Intervention Not Indicated Financial Strain Interventions: Intervention Not Indicated Housing Interventions: Intervention Not Indicated  Readmission Risk Interventions No flowsheet data found.

## 2022-01-27 ENCOUNTER — Other Ambulatory Visit: Payer: Self-pay

## 2022-01-27 ENCOUNTER — Encounter (HOSPITAL_COMMUNITY): Payer: Self-pay | Admitting: Internal Medicine

## 2022-01-27 DIAGNOSIS — E871 Hypo-osmolality and hyponatremia: Secondary | ICD-10-CM | POA: Diagnosis not present

## 2022-01-27 DIAGNOSIS — I9589 Other hypotension: Secondary | ICD-10-CM | POA: Diagnosis not present

## 2022-01-27 DIAGNOSIS — I482 Chronic atrial fibrillation, unspecified: Secondary | ICD-10-CM | POA: Diagnosis not present

## 2022-01-27 DIAGNOSIS — I5043 Acute on chronic combined systolic (congestive) and diastolic (congestive) heart failure: Secondary | ICD-10-CM | POA: Diagnosis not present

## 2022-01-27 DIAGNOSIS — Z952 Presence of prosthetic heart valve: Secondary | ICD-10-CM | POA: Diagnosis not present

## 2022-01-27 LAB — MAGNESIUM: Magnesium: 2.6 mg/dL — ABNORMAL HIGH (ref 1.7–2.4)

## 2022-01-27 LAB — PROTIME-INR
INR: 2.4 — ABNORMAL HIGH (ref 0.8–1.2)
Prothrombin Time: 26.1 seconds — ABNORMAL HIGH (ref 11.4–15.2)

## 2022-01-27 LAB — HEPATIC FUNCTION PANEL
ALT: 17 U/L (ref 0–44)
AST: 42 U/L — ABNORMAL HIGH (ref 15–41)
Albumin: 3.1 g/dL — ABNORMAL LOW (ref 3.5–5.0)
Alkaline Phosphatase: 76 U/L (ref 38–126)
Bilirubin, Direct: 0.8 mg/dL — ABNORMAL HIGH (ref 0.0–0.2)
Indirect Bilirubin: 1.3 mg/dL — ABNORMAL HIGH (ref 0.3–0.9)
Total Bilirubin: 2.1 mg/dL — ABNORMAL HIGH (ref 0.3–1.2)
Total Protein: 7.1 g/dL (ref 6.5–8.1)

## 2022-01-27 LAB — GLUCOSE, CAPILLARY
Glucose-Capillary: 92 mg/dL (ref 70–99)
Glucose-Capillary: 121 mg/dL — ABNORMAL HIGH (ref 70–99)
Glucose-Capillary: 136 mg/dL — ABNORMAL HIGH (ref 70–99)
Glucose-Capillary: 110 mg/dL — ABNORMAL HIGH (ref 70–99)

## 2022-01-27 LAB — BASIC METABOLIC PANEL
Anion gap: 11 (ref 5–15)
Anion gap: 10 (ref 5–15)
BUN: 25 mg/dL — ABNORMAL HIGH (ref 8–23)
BUN: 26 mg/dL — ABNORMAL HIGH (ref 8–23)
CO2: 25 mmol/L (ref 22–32)
CO2: 24 mmol/L (ref 22–32)
Calcium: 9.6 mg/dL (ref 8.9–10.3)
Calcium: 9.4 mg/dL (ref 8.9–10.3)
Chloride: 87 mmol/L — ABNORMAL LOW (ref 98–111)
Chloride: 88 mmol/L — ABNORMAL LOW (ref 98–111)
Creatinine, Ser: 1.66 mg/dL — ABNORMAL HIGH (ref 0.61–1.24)
Creatinine, Ser: 1.74 mg/dL — ABNORMAL HIGH (ref 0.61–1.24)
GFR, Estimated: 43 mL/min — ABNORMAL LOW (ref >60)
GFR, Estimated: 41 mL/min — ABNORMAL LOW (ref >60)
Glucose, Bld: 95 mg/dL (ref 70–99)
Glucose, Bld: 92 mg/dL (ref 70–99)
Potassium: 5.4 mmol/L — ABNORMAL HIGH (ref 3.5–5.1)
Potassium: 4.7 mmol/L (ref 3.5–5.1)
Sodium: 123 mmol/L — ABNORMAL LOW (ref 135–145)
Sodium: 122 mmol/L — ABNORMAL LOW (ref 135–145)

## 2022-01-27 LAB — CBC
HCT: 35.3 % — ABNORMAL LOW (ref 39.0–52.0)
Hemoglobin: 11.9 g/dL — ABNORMAL LOW (ref 13.0–17.0)
MCH: 26.2 pg (ref 26.0–34.0)
MCHC: 33.7 g/dL (ref 30.0–36.0)
MCV: 77.8 fL — ABNORMAL LOW (ref 80.0–100.0)
Platelets: 137 10*3/uL — ABNORMAL LOW (ref 150–400)
RBC: 4.54 MIL/uL (ref 4.22–5.81)
RDW: 20.6 % — ABNORMAL HIGH (ref 11.5–15.5)
WBC: 5.9 10*3/uL (ref 4.0–10.5)
nRBC: 0 % (ref 0.0–0.2)

## 2022-01-27 LAB — COOXEMETRY PANEL
Carboxyhemoglobin: 2.1 % — ABNORMAL HIGH (ref 0.5–1.5)
Methemoglobin: 0.8 % (ref 0.0–1.5)
O2 Saturation: 98.7 %
Total hemoglobin: 11.9 g/dL — ABNORMAL LOW (ref 12.0–16.0)

## 2022-01-27 MED ORDER — WARFARIN SODIUM 5 MG PO TABS
5.0000 mg | ORAL_TABLET | Freq: Once | ORAL | Status: AC
  Administered 2022-01-27: 5 mg via ORAL
  Filled 2022-01-27: qty 1

## 2022-01-27 MED ORDER — BISACODYL 10 MG RE SUPP
10.0000 mg | Freq: Once | RECTAL | Status: DC
Start: 2022-01-27 — End: 2022-01-27
  Filled 2022-01-27: qty 1

## 2022-01-27 MED ORDER — POTASSIUM CHLORIDE 20 MEQ PO PACK
40.0000 meq | PACK | Freq: Every day | ORAL | Status: DC
  Administered 2022-01-28 – 2022-02-01 (×5): 40 meq via ORAL
  Filled 2022-01-27 (×5): qty 2

## 2022-01-27 MED ORDER — SENNOSIDES-DOCUSATE SODIUM 8.6-50 MG PO TABS
1.0000 | ORAL_TABLET | Freq: Once | ORAL | Status: AC
  Administered 2022-01-27: 1 via ORAL
  Filled 2022-01-27: qty 1

## 2022-01-27 MED ORDER — ORAL CARE MOUTH RINSE
15.0000 mL | Freq: Two times a day (BID) | OROMUCOSAL | Status: DC
  Administered 2022-01-27 – 2022-02-05 (×18): 15 mL via OROMUCOSAL

## 2022-01-27 MED ORDER — TORSEMIDE 20 MG PO TABS
40.0000 mg | ORAL_TABLET | Freq: Two times a day (BID) | ORAL | Status: DC
  Administered 2022-01-28 – 2022-02-03 (×12): 40 mg via ORAL
  Filled 2022-01-27 (×13): qty 2

## 2022-01-27 NOTE — Progress Notes (Signed)
This chaplain is present for creating/updating the Pt. Advance Directive:  HCPOA and Living Will. The Pt. completed AD education and responded appropriately to the chaplain's clarifying questions.   The chaplain is present with the Pt., notary, and two witnesses for the notarizing of the Pt. AD:  HCPOA and Living Will. The Pt. named [name] as his HCPOA. If the Pt. HCPOA is unwilling or unable to serve in this role the Pt. next choice is [employer] .  The chaplain gave the Pt. the original document and two copies. The chaplain scanned a copy of the Pt. AD into EMR. The chaplain understands the Pt. is requesting the Pt. HCPOAs be moved to the top of the Pt. Patient Contacts.  The chaplain is available for F/U spiritual care as needed.  Chaplain Sallyanne Kuster [phone]

## 2022-01-27 NOTE — Progress Notes (Signed)
Mobility Specialist Progress Note:   01/27/22 1537  Mobility  Activity Refused mobility   Pt stating he walked earlier and needs to rest. Will follow-up as time allows.   [facility] Public librarian Phone [phone] Secondary Phone [phone]

## 2022-01-27 NOTE — Plan of Care (Signed)
Problem: Education: Goal: Ability to demonstrate management of disease process will improve Outcome: Progressing Goal: Ability to verbalize understanding of medication therapies will improve Outcome: Progressing Goal: Individualized Educational Video(s) Outcome: Progressing   Problem: Activity: Goal: Capacity to carry out activities will improve Outcome: Progressing   Problem: Cardiac: Goal: Ability to achieve and maintain adequate cardiopulmonary perfusion will improve Outcome: Progressing   Problem: Metabolic: Goal: Ability to maintain appropriate glucose levels will improve Outcome: Progressing   Problem: Education: Goal: Knowledge of disease or condition will improve Outcome: Progressing   Problem: Health Behavior/Discharge Planning: Goal: Ability to identify changes in lifestyle to reduce recurrence of condition will improve Outcome: Progressing   Problem: Physical Regulation: Goal: Complications related to the disease process, condition or treatment will be avoided or minimized Outcome: Progressing   Problem: Safety: Goal: Ability to remain free from injury will improve Outcome: Progressing

## 2022-01-27 NOTE — TOC CM/SW Note (Addendum)
HF TOC CM spoke to pt at bedside. States he has oxygen with [employer]. He has RW and cane at home. Pt agreeable to having [facility] come for [facility]. Will need HF [facility] RN orders with [facility]. Has a friend that lives in the home. His friends will assist him to his appts. Pt declines to go to SNF rehab. [place], Heart Failure TOC CM [phone]

## 2022-01-27 NOTE — Progress Notes (Signed)
ANTICOAGULATION CONSULT NOTE - Follow Up Consult  Pharmacy Consult for Warfarin Indication: mechanical AVR  Allergies  Allergen Reactions   Augmentin [Amoxicillin-Pot Clavulanate] Anaphylaxis and Diarrhea    "Upset stomach"   Pantoprazole Sodium Nausea Only    Gets gassy and starting itching like crazy Gets gassy and starting itching like crazy   Valium [Diazepam] Other (See Comments)    HA and Abd pain.   Tape Rash and Other (See Comments)    Surgical tape   Wound Dressing Adhesive Other (See Comments) and Rash    Surgical tape Surgical tape Surgical tape    Vital Signs: Temp: 97.9 F (36.6 C) (02/03 1117) Temp Source: Oral (02/03 1117) BP: 90/73 (02/03 1117) Pulse Rate: 100 (02/03 0700)  Labs: Recent Labs    01/25/22 0511 01/26/22 0244 01/27/22 0301 01/27/22 0601  HGB 11.1* 11.0* 11.9*  --   HCT 33.0* 33.7* 35.3*  --   PLT 122* 135* 137*  --   LABPROT 33.3* 29.0* 26.1*  --   INR 3.3* 2.7* 2.4*  --   CREATININE 1.23 1.25* 1.66* 1.74*     Estimated Creatinine Clearance: 29 mL/min (A) (by C-G formula based on SCr of 1.74 mg/dL (H)).   Medications:  Scheduled:   aspirin EC  81 mg Oral q AM   docusate sodium  200 mg Oral BID   famotidine  10 mg Oral Daily   ferrous sulfate  325 mg Oral QODAY   folic acid  1 mg Oral Daily   insulin aspart  0-9 Units Subcutaneous TID WC   mouth rinse  15 mL Mouth Rinse BID   melatonin  5 mg Oral QHS   midodrine  15 mg Oral TID WC   multivitamin with minerals  1 tablet Oral Daily   pantoprazole  40 mg Oral Daily   polyethylene glycol  17 g Oral Daily   potassium chloride  40 mEq Oral BID   rosuvastatin  5 mg Oral Daily   Selexipag  200 mcg Oral BID   sodium chloride flush  3 mL Intravenous Q12H   spironolactone  25 mg Oral Daily   thiamine  100 mg Oral Daily   Or   thiamine  100 mg Intravenous Daily   tiZANidine  2 mg Oral TID   traZODone  50 mg Oral QHS   Warfarin - Pharmacist Dosing Inpatient   Does not apply q1600     Assessment: 76 y.o. male with medical history significant of chronic systolic CHF NYHA class III, HTN, CAD and aortic stenosis status post CABG and mechanical AVR on Coumadin, orthostatic hypotension, COPD and pulmonary hypertension, IIDM, PAF, liver cirrhosis secondary to alcohol abuse, presented with increasing leg swelling.  Home regimen per patient: warfarin 53m MonWed and warfarin 2.565mall other days.   INR 2.4 this morning. No bleeding issues noted.  CBC stable.  Goal of Therapy:  INR goal 2.5-3.0 (per clinic note) Monitor platelets by anticoagulation protocol: Yes   Plan:  Warfarin 5 mg tonight  Daily INR, s/s bleeding  FrErin HearingharmD., BCPS Clinical Pharmacist 01/27/2022 12:23 PM

## 2022-01-27 NOTE — Progress Notes (Signed)
Physical Therapy Treatment Patient Details Name: [facility] MRN: [id number] DOB: July 15, 1946 Today's Date: 01/27/2022   History of Present Illness 76 yo male presenting to ED on 1/30 with increased LE edema and shortness of breath. Patient with recent admit to [facility] with  pneumonia and accidental ingestion of feul injection fluid, but leaving AMA. PHM includes: a fib, a flutter, chronic anticoagulation, chronic back pain, COPD, CAD, dyspnea, HTN, aortic valve replacement 2018, CABG 2018, HLD, kidney stones, leukocytoclastic vasculitis, pleural effusion, syncope, wrist and forearm fx surgery, and alcohol abuse.    PT Comments    Patient received in bed, he is agreeable to PT after initially stating he wanted to wait until his BP was higher and then he'd be able to walk fine. Checked his BP and it was 102/62. Patient is mod independent with bed mobility, requiring increased time and effort with supine to sit. He requires cues for safety with transfers and min guard. He ambulated 20 feet with rollator to door and back and after seated rest was able to ambulate 40 more feet in room with rollator and min guard. Patient initially quite unsteady, but balance improved with increased distance. He will continue to benefit from skilled PT while here to improve strength, balance and safety with mobility. He states he is planning to have someone come stay with him at home when he leaves.     Recommendations for follow up therapy are one component of a multi-disciplinary discharge planning process, led by the attending physician.  Recommendations may be updated based on patient status, additional functional criteria and insurance authorization.  Follow Up Recommendations  Skilled nursing-short term rehab (<3 hours/day)     Assistance Recommended at Discharge Frequent or constant Supervision/Assistance  Patient can return home with the following A little help with bathing/dressing/bathroom;A  little help with walking and/or transfers;Assistance with cooking/housework;Assist for transportation;Help with stairs or ramp for entrance   Equipment Recommendations  None recommended by PT    Recommendations for Other Services       Precautions / Restrictions Precautions Precautions: Fall Precaution Comments: monitor O2 sats Restrictions Weight Bearing Restrictions: No     Mobility  Bed Mobility Overal bed mobility: Needs Assistance Bed Mobility: Supine to Sit, Sit to Supine     Supine to sit: Min guard Sit to supine: Supervision   General bed mobility comments: Requires increased time and effort to raise trunk to seated position, but states " let me do it".    Transfers Overall transfer level: Needs assistance Equipment used: Rollator (4 wheels) Transfers: Sit to/from Stand Sit to Stand: Min guard           General transfer comment: cues for hand placement and then still does not put hands in correct place. Pulls up from walker.    Ambulation/Gait Ambulation/Gait assistance: Min guard Gait Distance (Feet): 60 Feet Assistive device: Rollator (4 wheels) Gait Pattern/deviations: Step-to pattern, Decreased stride length, Wide base of support Gait velocity: reduced     General Gait Details: patient ambulated to door and back 3 times. Improved stability with each rep.   Stairs             Wheelchair Mobility    Modified Rankin (Stroke Patients Only)       Balance Overall balance assessment: Needs assistance Sitting-balance support: Feet supported Sitting balance-Leahy Scale: Good     Standing balance support: Bilateral upper extremity supported, During functional activity, Reliant on assistive device for balance Standing balance-Leahy Scale: Poor  Standing balance comment: benefits from BUE support                            Cognition Arousal/Alertness: Awake/alert Behavior During Therapy: WFL for tasks  assessed/performed Overall Cognitive Status: No family/caregiver present to determine baseline cognitive functioning                                          Exercises      General Comments        Pertinent Vitals/Pain Pain Assessment Pain Assessment: No/denies pain    Home Living                          Prior Function            PT Goals (current goals can now be found in the care plan section) Acute Rehab PT Goals Patient Stated Goal: to walk and go home again, NO REHAB PT Goal Formulation: With patient Time For Goal Achievement: 02/07/22 Potential to Achieve Goals: Fair Progress towards PT goals: Progressing toward goals    Frequency    Min 3X/week      PT Plan Current plan remains appropriate    Co-evaluation              AM-PAC PT "6 Clicks" Mobility   Outcome Measure  Help needed turning from your back to your side while in a flat bed without using bedrails?: A Little Help needed moving from lying on your back to sitting on the side of a flat bed without using bedrails?: A Little Help needed moving to and from a bed to a chair (including a wheelchair)?: A Little Help needed standing up from a chair using your arms (e.g., wheelchair or bedside chair)?: A Little Help needed to walk in hospital room?: A Little Help needed climbing 3-5 steps with a railing? : A Lot 6 Click Score: 17    End of Session Equipment Utilized During Treatment: Gait belt Activity Tolerance: Patient tolerated treatment well Patient left: in bed;with call bell/phone within reach;with bed alarm set Nurse Communication: Mobility status PT Visit Diagnosis: Unsteadiness on feet (R26.81);Muscle weakness (generalized) (M62.81);Difficulty in walking, not elsewhere classified (R26.2);Adult, failure to thrive (R62.7)     Time: 0922-0943 PT Time Calculation (min) (ACUTE ONLY): 21 min  Charges:  $Gait Training: 8-22 mins                     [employer], PT, G[employer] 01/27/22,10:02 AM

## 2022-01-27 NOTE — Care Management Important Message (Signed)
Important Message  Patient Details  Name: [facility] MRN: [id number] Date of Birth: Jan 25, 1946   Medicare Important Message Given:  Yes     Shelda Altes 01/27/2022, 8:37 AM

## 2022-01-27 NOTE — Progress Notes (Addendum)
Advanced Heart Failure Rounding Note  PCP-Cardiologist: None   Subjective:   1/31 Started on milrinone 0.25 mcg + lasix 20 mg+ metolazone.  2/2 Milrinone stopped due to hypotension.   Diuresing with lasix drip. Overall weight 9 pounds.   Asking for medications for anxiety. Denies SOB.     Objective:   Weight Range: 55 kg Body mass index is 17.91 kg/m.   Vital Signs:   Temp:  [97.7 F (36.5 C)-98.1 F (36.7 C)] 97.9 F (36.6 C) (02/03 1117) Pulse Rate:  [43-100] 100 (02/03 0700) Resp:  [18] 18 (02/03 1117) BP: (88-107)/(55-80) 90/73 (02/03 1117) SpO2:  [94 %-96 %] 95 % (02/03 1117) Weight:  [55 kg] 55 kg (02/03 0400) Last BM Date: 01/26/22  Weight change: Filed Weights   01/25/22 0433 01/26/22 0345 01/27/22 0400  Weight: 57.2 kg 55.5 kg 55 kg    Intake/Output:   Intake/Output Summary (Last 24 hours) at 01/27/2022 1200 Last data filed at 01/27/2022 0849 Gross per 24 hour  Intake 369.53 ml  Output 200 ml  Net 169.53 ml      Physical Exam   General:  Appears chronically ill. No resp difficulty HEENT: normal Neck: supple. JVP 6-7 . Carotids 2+ bilat; no bruits. No lymphadenopathy or thryomegaly appreciated. Cor: PMI nondisplaced. Irregular rate & rhythm. No rubs, gallops or murmurs. Lungs: clear Abdomen: soft, nontender, nondistended. No hepatosplenomegaly. No bruits or masses. Good bowel sounds. Extremities: no cyanosis, clubbing, rash, edema Neuro: alert & orientedx3, cranial nerves grossly intact. moves all 4 extremities w/o difficulty. Affect flat   Telemetry  A fib 40-50s   EKG    N/A  Labs    CBC Recent Labs    01/26/22 0244 01/27/22 0301  WBC 4.2 5.9  HGB 11.0* 11.9*  HCT 33.7* 35.3*  MCV 78.0* 77.8*  PLT 135* 096*   Basic Metabolic Panel Recent Labs    01/26/22 0244 01/27/22 0301 01/27/22 0601  NA 125* 123* 122*  K 3.5 5.4* 4.7  CL 87* 87* 88*  CO2 _0 GLUCOSE 104* 95 92  BUN 17 25* 26*  CREATININE 1.25* 1.66*  1.74*  CALCIUM 9.6 9.6 9.4  MG 1.6* 2.6*  --    Liver Function Tests Recent Labs    01/27/22 0301  AST 42*  ALT 17  ALKPHOS 76  BILITOT 2.1*  PROT 7.1  ALBUMIN 3.1*   No results for input(s): LIPASE, AMYLASE in the last 72 hours. Cardiac Enzymes No results for input(s): CKTOTAL, CKMB, CKMBINDEX, TROPONINI in the last 72 hours.  BNP: BNP (last 3 results) Recent Labs    09/30/21 0241 01/12/22 1524 01/23/22 1145  BNP 840.7* 1,341.7* 1,831.8*    ProBNP (last 3 results) Recent Labs    07/04/21 1607 08/16/21 1620  PROBNP 425.0* 601.0*     D-Dimer No results for input(s): DDIMER in the last 72 hours. Hemoglobin A1C No results for input(s): HGBA1C in the last 72 hours.  Fasting Lipid Panel No results for input(s): CHOL, HDL, LDLCALC, TRIG, CHOLHDL, LDLDIRECT in the last 72 hours. Thyroid Function Tests No results for input(s): TSH, T4TOTAL, T3FREE, THYROIDAB in the last 72 hours.  Invalid input(s): FREET3  Other results:   Imaging    No results found.   Medications:     Scheduled Medications:  aspirin EC  81 mg Oral q AM   docusate sodium  200 mg Oral BID   famotidine  10 mg Oral Daily   ferrous sulfate  325 mg Oral QODAY   folic acid  1 mg Oral Daily   insulin aspart  0-9 Units Subcutaneous TID WC   mouth rinse  15 mL Mouth Rinse BID   melatonin  5 mg Oral QHS   midodrine  15 mg Oral TID WC   multivitamin with minerals  1 tablet Oral Daily   pantoprazole  40 mg Oral Daily   polyethylene glycol  17 g Oral Daily   potassium chloride  40 mEq Oral BID   rosuvastatin  5 mg Oral Daily   Selexipag  200 mcg Oral BID   sodium chloride flush  3 mL Intravenous Q12H   spironolactone  25 mg Oral Daily   thiamine  100 mg Oral Daily   Or   thiamine  100 mg Intravenous Daily   tiZANidine  2 mg Oral TID   traZODone  50 mg Oral QHS   Warfarin - Pharmacist Dosing Inpatient   Does not apply q1600    Infusions:  sodium chloride     furosemide (LASIX) 200  mg in dextrose 5% 100 mL (45m/mL) infusion 10 mg/hr (01/27/22 0501)   milrinone Stopped (01/26/22 0615)    PRN Medications: sodium chloride, acetaminophen, guaiFENesin-dextromethorphan, methocarbamol, ondansetron (ZOFRAN) IV, ondansetron, polyvinyl alcohol, simethicone, sodium chloride flush    Patient Profile   76y/o old male with end-stage PAH/RV failure with chronic hypotension and hyponatremia. Also has CKD 3b, CAD s/p CABG/AVR.   Over past year multiple admits for volume overload and hyponatremia.    Recent had accidental ingestion of fuel injector cleaner. [facility]    Now presents with hypotension and volume overload   Assessment/Plan   1. A/C Biventricular Heart Failure, Predominant RV Failure   - Echo 10/22 EF 45-50% RV low normal  - Marked volume overload. BNP 1831. CXR with pulmonary edema.  - Brisk diuresis with milrinone 0.25 cmg + lasix drip + metolazone.  - Off milrinone due to hypotension which is ok with ongoing hypotension  - Volume status much improved, wt down 9  lb.  - Stop lasix drip. Tomorrow start torsemide 40 mg twice a day  - Renal function trending up. Suspect this is a dry as we can get him.    2. PAH  -WHO Group I & II - Auto-immune serologies negative (checked twice). - ? Component of HHT/shunt/AVMs with late bubbles on bubble study.  - Echo with bubble study (05/16/18): EF 55-60% with "late bubbles" , Mechanical AVR stable with trivial perivalvular regurg, Mild MR, Severe LAE, Severe RV dilation and reduced function. No PFO. Mod TR, PA peak pressure 68 mm Hg - Echo (4/22): EF 50-55%, RV ok, severe RVSP 61.6 mmHg, severe biatrial enlargement, moderate TR, stable AVR, AV mean gradient 11 mmHg - Echo 10/22 EF 45-50 RV low normal  - Ab u/s with cirrhosis but no evidence of portal HTN. - Hold tadalafil and Macitentan w/ hypotension.  -Continue Uptravi.    3. Chronic A fib  - No longer on bb due to bradycardia.  - INR  2.4  - Coumadin per pharmacy    4. Hypotension  - in setting of RV failure - continue midodrine 15 tid   5. Hyponatremia  - in setting of end-stage HF  - Sodium 130-->126->125->122  - Could consider tolvaptan. He is oriented.  - Restrict fluids    6. PVCs, frequent  7. CAD  - s/[place] CABG 07[place]2017 [place]- No chest pain  8. Leukocytoclastic Vasculitis - Followed by Rheumatology    9. Cirrhosis US Abdomen RUQ 05/16/18 - s/p cholecystectomy. + hepatic cirrhosis. Mild ascites.  - Liver Doppler 05/16/18 - No hepatic, splenic, or portal venous thrombosis or occlusion. Mild ascites   9. S/P Mechanical AVR - Coumadin held on admit w/ supratherapeutic INR (4.1) - INR 2.4  today  - Continue coumadin, dosing per pharmacy   10. Hypokalemia/Hypomagnesemia - K stable. Check Mag    11. Pleural Effusions  - b/l effusion on admit CXR - exam suggest persistent effusions despite diuresis  - repeat CXR, may need therapeutic thoracentesis   12. GOC:  - End-stage HF - Palliative care following - C/w full scope medical tx for now  - DNR/DNI  - Palliative Care set up.  - Refuses SNF.[place] set up.   PT/OT following.    Length of Stay: 4  Amy Clegg, NP  01/27/2022, 12:00 PM  Advanced Heart Failure Team Pager [phone] (M-F; 7a - 5p)  Please contact [facility] for night-coverage after hours (5p -7a ) and weekends on [email or web address]  Patient seen and examined with the above-signed Advanced Practice Provider and/or Housestaff. I personally reviewed laboratory data, imaging studies and relevant notes. I independently examined the patient and formulated the important aspects of the plan. I have edited the note to reflect any of my changes or salient points. I have personally discussed the plan with the patient and/or family.  Remains on lasix gtt. Milrinone stopped due to hypotension. Says bloating has improved but says he still feels overloaded. Very weak.   General:  Terminally  ill-appearing. No resp difficulty HEENT: normal Neck: supple. JVP to jaw Carotids 2+ bilat; no bruits. No lymphadenopathy or thryomegaly appreciated. Cor: PMI nondisplaced. Irregular rate & rhythm. Mech s2 Lungs: clear Abdomen: soft, nontender, nondistended. No hepatosplenomegaly. No bruits or masses. Good bowel sounds. Extremities: no cyanosis, clubbing, rash, tr edema in thighs Neuro: alert & orientedx3, cranial nerves grossly intact. moves all 4 extremities w/o difficulty. Affect pleasant  Modest diuresis on lasix gtt. Scr bumping back up toward baseline. Has not tolerated milrinone due to hypotension. INR 2.4  He is terminally ill with no durable options for support left. He is refusing Palliative Care or SNF. I am not sure what next steps are. He is unsafe to go home alone.  Continue to reinforce need for Hospice support.   Glori Bickers, MD  2:52 PM

## 2022-01-27 NOTE — Progress Notes (Signed)
Progress Note   Patient: Lance Hicks DOB: 1946/01/18 DOA: 01/23/2022     4 DOS: the patient was seen and examined on 01/27/2022   Brief [facility] course: Lance Hicks is a 76 year old male with past medical history significant for chronic systolic congestive heart failure NYHA class III, essential pretension, CAD s/p CABG, aortic stenosis s/p aortic valve replacement on Coumadin, orthostatic hypotension, COPD, pulmonary hypertension, type 2 diabetes mellitus, paroxysmal atrial fibrillation, cirrhosis secondary to EtOH abuse who presented to [facility] on 1/30 with progressive lower extremity edema.  Recently hospitalized at [facility] for pneumonia; treated with antibiotics in which his Lasix was held for 3 days for "low blood pressure".  Discharged home and patient started noticed increased swelling of his lower legs and he resumed his Lasix for the past 3 days however with no improvement.  Patient also reporting exertional dyspnea and was noted by his friend to be "very winded" and sent him to the [facility] for further evaluation.  Denies cough, no fever/chills.  In the ED, BP was slightly hypotensive, found to be hypoxic and placed on 3 L nasal cannula.  Chest x-ray with bilateral pulmonary congestion.  Cardiology consulted and patient was given 80 mg IV Lasix.  [facility] service consulted for further evaluation management.   Assessment and Plan: Acute on chronic combined systolic and diastolic CHF (congestive heart failure) ([place])- (present on admission) Patient presenting to ED with progressive shortness of breath in the setting of his home Lasix.  Patient was found to be hypoxic requiring submental oxygen to maintain adequate SPO2.  BNP elevated 1831.  Chest x-ray with pulmonary edema, bilateral effusions greater on right. --Cardiology following, appreciate assistance --net negative 50.34m past 24h and net negative 8.2L since admission --Wt  59.4>57.2>55.5>55kg --Furosemide drip --Milrinone drip d/c'd for hypotension --Midodrine 15 mg p.o. TID --Spironolactone 25 mg p.o. daily --Supplemental oxygen remains off --Strict I's and O's and daily weights --Monitor renal function closely daily --Overall poor prognosis, seen by palliative care on 2/1 for assistance with goals of care and medical decision making, now DNR but plan to continue aggressive care with plan to return home with PT/OT  Chronic atrial fibrillation ([place]- (present on admission) Rate controlled, not on beta-blocker/CCB outpatient. --Continue Coumadin, pharmacy consulted for dosing/monitoring (INR goal 2.5-3.5) --INR 2.4 today --continue to monitor  [name] --Continue Coumadin, pharmacy consulted for dosing/monitoring --Goal INR 2.5-3.5; 2.4 today --Continue monitor INR daily  Hypotension, chronic- (present on admission) --Continue midodrine 15 mg p.o. TID  Hyperlipidemia- (present on admission) --Crestor 5 mg p.o. daily  GERD (gastroesophageal reflux disease)- (present on admission) --Protonix 40 mg p.o. daily  Alcohol abuse- (present on admission) Counseled on need for cessation. --CIWA protocol with symptom triggered Ativan --Thiamine, multivitamin, folic acid  Iron deficiency anemia- (present on admission) Anemia panel with iron 164, TIBC 360, ferritin 38, folate 29.1, B12 2080. --Ferrous sulfate 325 mg p.o. every other day  Hyponatremia- (present on admission) Sodium 131 on admission, etiology likely secondary to hypervolemic hyponatremia in the setting of volume overload/CHF exacerbation as above. --Na 131>>>125>122 --IV lasix and spironolactone as above --BMP daily  Pulmonary arterial hypertension ([place]- (present on admission) Home regimen includes tadalafil 40 mg p.o. every morning. --Selexipag 20101m PO BID  Physical deconditioning- (present on admission) Patient with significant weakness, debility,  gait disturbance and deconditioning from recurrent hospitalization.  PT/OT currently recommends SNF placement, patient declines and states has many friends including the [employer] community will assist him when  he returns home. --Continue PT/OT efforts while inpatient, anticipate home with home health when ready for discharge  Hypokalemia, Hypomagnesmia Potassium 4.7, magnesium 2.6 this morning --Continue to follow electrolytes daily        Subjective:  Patient seen examined at bedside, resting comfortably in bed.  No family present.  Eating breakfast, complaining "this is not what I ordered".  Also states "cannot get any sleep due to all of the beeps all night long".  Patient concerned that is not receiving high enough dose of his midodrine, discussed with patient that he is on the high dose which is his home dose of 50 mg TID.  Also states needs "more fluid off of his legs".  Overall appears frustrated.  Updated patient's daughter via telephone yesterday afternoon; she will be coming to see her father likely tomorrow.  Patient with no other complaints or concerns at this time.  Physical Exam: Vitals:   01/26/22 2000 01/27/22 0400 01/27/22 0700 01/27/22 0845  BP: 107/77 92/70 (!) 88/55   Pulse: (!) 55 (!) 56 100   Resp: 18     Temp: 98.1 F (36.7 C) 97.7 F (36.5 C)    TempSrc: Oral     SpO2: 96% 94% 94% 94%  Weight:  55 kg    Height:       Physical Exam GEN: 76 yo male in NAD, alert and oriented x 3, chronically ill/cachectic in appearance HEENT: NCAT, PERRL, EOMI, sclera clear, MMM PULM: CTAB w/o wheezes/crackles, normal respiratory effort, on room air CV: Irregularly irregular rhythm, normal rate w/o M/G/R GI: abd soft, NTND, NABS, no R/G/M MSK: no peripheral edema, muscle strength globally intact 5/5 bilateral upper/lower extremities NEURO: CN II-XII intact, no focal deficits, sensation to light touch intact PSYCH: normal mood/affect Integumentary: Chronic venous changes,  discoloration to lower legs, otherwise no concerning rashes/lesions/wounds.     Data Reviewed:  CMP and CBC reviewed this morning  Family Communication: Updated patient's daughter, [name] via telephone  Disposition: Status is: Inpatient Remains inpatient appropriate because: Remains on Lasix drip, further per cardiology, anticipate discharge home with home health services as patient declines SNF placement.          Planned Discharge Destination: Home with Home Health     Time spent: 42 minutes spent on chart review, discussion with nursing staff, consultants, updating family and interview/physical exam; more than 50% of that time was spent in counseling and/or coordination of care.  Author: Borna Wessinger J [place] DO 01/27/2022 10:27 AM  For on call review [email or web address].

## 2022-01-27 NOTE — Progress Notes (Addendum)
Occupational Therapy Treatment Patient Details Name: Lance Hicks MRN: [id number] DOB: [birth year] Today's Date: 01/27/2022   History of present illness 76 yo male presenting to ED on 1/30 with increased LE edema and shortness of breath. Patient with recent admit to [facility] with  pneumonia and accidental ingestion of feul injection fluid, but leaving AMA. PHM includes: a fib, a flutter, chronic anticoagulation, chronic back pain, COPD, CAD, dyspnea, HTN, aortic valve replacement 2018, CABG 2018, HLD, kidney stones, leukocytoclastic vasculitis, pleural effusion, syncope, wrist and forearm fx surgery, and alcohol abuse.   OT comments  Patient continues to make steady progress towards goals in skilled OT session. Patient's session encompassed  full bath standing at EOB as patient was saturated in urine. Patient was unaware and states he didn't feel wet despite his gown, and all sheets being heavily saturated. Patient wanting to stand with rollator in order to get clean and have sheets changed. Patient able to participate in bathing and dressing (min A) in standing with one hand on the rollator for balance. Patient motivated to walk later when "I get my blood pressure medication on board". Patient left with NT reapplying male purewick. Therapy will continue to follow.    Recommendations for follow up therapy are one component of a multi-disciplinary discharge planning process, led by the attending physician.  Recommendations may be updated based on patient status, additional functional criteria and insurance authorization.    Follow Up Recommendations  Home health OT    Assistance Recommended at Discharge Intermittent Supervision/Assistance  Patient can return home with the following  A little help with walking and/or transfers;A little help with bathing/dressing/bathroom;Assistance with cooking/housework;Assist for transportation   Equipment Recommendations  BSC/3in1    Recommendations  for Other Services      Precautions / Restrictions Precautions Precautions: Fall Precaution Comments: monitor O2 sats Restrictions Weight Bearing Restrictions: No       Mobility Bed Mobility Overal bed mobility: Needs Assistance Bed Mobility: Supine to Sit, Sit to Supine Rolling: Min guard Sidelying to sit: Min guard       General bed mobility comments: increased ability to complete bed mobility, extra time needed and use of bed rails, min guard for safety but no actual assist needed    Transfers Overall transfer level: Needs assistance Equipment used: Rollator (4 wheels) Transfers: Sit to/from Stand Sit to Stand: Min guard           General transfer comment: good powering up noted, needed verbal cues for appropriate hand placement     Balance Overall balance assessment: Needs assistance Sitting-balance support: Feet supported Sitting balance-Leahy Scale: Good     Standing balance support: Bilateral upper extremity supported, During functional activity Standing balance-Leahy Scale: Poor                             ADL either performed or assessed with clinical judgement   ADL Overall ADL's : Needs assistance/impaired Eating/Feeding: Independent   Grooming: Wash/dry face;Wash/dry hands;Standing;Set up Grooming Details (indicate cue type and reason): standing with rollator at bedside in order to remake bed saturated in urine Upper Body Bathing: Set up Upper Body Bathing Details (indicate cue type and reason): standing with rollator at bedside in order to remake bed saturated in urine Lower Body Bathing: Sit to/from stand;Minimal assistance Lower Body Bathing Details (indicate cue type and reason): patient able to get the front while OT assisted with the back, one hand holding onto rollator  at all times Upper Body Dressing : Set up;Standing Upper Body Dressing Details (indicate cue type and reason): standing with rollator at bedside in order to remake  bed saturated in urine     Toilet Transfer: Minimal assistance;Cueing for sequencing;Cueing for safety;Ambulation;Rollator (4 wheels)           Functional mobility during ADLs: Minimal assistance;Cueing for safety;Cueing for sequencing;Rolling walker (2 wheels) General ADL Comments: Patient unknowingly saturated in urine, completing bathing in standing in order to change bed linens, improved activity tolerance and motivated to walk later "once my BP meds are on board"    Extremity/Trunk Assessment              Vision       Perception     Praxis      Cognition Arousal/Alertness: Awake/alert Behavior During Therapy: Flat affect Overall Cognitive Status: No family/caregiver present to determine baseline cognitive functioning                                 General Comments: patient covered in urine upon entry into room, not realizing his [place] working        Exercises      Shoulder Instructions       General Comments      Pertinent Vitals/ Pain       Pain Assessment Pain Assessment: No/denies pain  Home Living                                          Prior Functioning/Environment              Frequency  Min 2X/week        Progress Toward Goals  OT Goals(current goals can now be found in the care plan section)  Progress towards OT goals: Progressing toward goals  Acute Rehab OT Goals Patient Stated Goal: to get better and stronger OT Goal Formulation: With patient Time For Goal Achievement: 02/08/22 Potential to Achieve Goals: [place] Discharge plan remains appropriate    Co-evaluation                 AM-PAC OT "6 Clicks" Daily Activity     Outcome Measure   Help from another person eating meals?: None Help from another person taking care of personal grooming?: A Little Help from another person toileting, which includes using toliet, bedpan, or urinal?: A Little Help from another  person bathing (including washing, rinsing, drying)?: A Little Help from another person to put on and taking off regular upper body clothing?: A Little Help from another person to put on and taking off regular lower body clothing?: A Little 6 Click Score: 19    End of Session Equipment Utilized During Treatment: Rollator (4 wheels)  OT Visit Diagnosis: Unsteadiness on feet (R26.81);Other abnormalities of gait and mobility (R26.89);Muscle weakness (generalized) (M62.81)   Activity Tolerance Patient tolerated treatment well   Patient Left in bed;with call bell/phone within reach;with bed alarm set;with nursing/sitter in room   Nurse Communication Mobility status        Time: 8466-5993 OT Time Calculation (min): 20 min  Charges: OT General Charges $OT Visit: 1 Visit OT Treatments $Self Care/Home Management : 8-22 mins  Corinne Ports E. Kaliana Albino, OTR/L Acute Rehabilitation Services [phone] [place] 01/27/2022, 8:55  AM

## 2022-01-28 DIAGNOSIS — I9589 Other hypotension: Secondary | ICD-10-CM | POA: Diagnosis not present

## 2022-01-28 DIAGNOSIS — E43 Unspecified severe protein-calorie malnutrition: Secondary | ICD-10-CM

## 2022-01-28 DIAGNOSIS — E871 Hypo-osmolality and hyponatremia: Secondary | ICD-10-CM | POA: Diagnosis not present

## 2022-01-28 DIAGNOSIS — Z952 Presence of prosthetic heart valve: Secondary | ICD-10-CM | POA: Diagnosis not present

## 2022-01-28 DIAGNOSIS — I482 Chronic atrial fibrillation, unspecified: Secondary | ICD-10-CM | POA: Diagnosis not present

## 2022-01-28 DIAGNOSIS — I5043 Acute on chronic combined systolic (congestive) and diastolic (congestive) heart failure: Secondary | ICD-10-CM | POA: Diagnosis not present

## 2022-01-28 LAB — BASIC METABOLIC PANEL
Anion gap: 10 (ref 5–15)
BUN: 33 mg/dL — ABNORMAL HIGH (ref 8–23)
CO2: 26 mmol/L (ref 22–32)
Calcium: 9.5 mg/dL (ref 8.9–10.3)
Chloride: 87 mmol/L — ABNORMAL LOW (ref 98–111)
Creatinine, Ser: 1.99 mg/dL — ABNORMAL HIGH (ref 0.61–1.24)
GFR, Estimated: 35 mL/min — ABNORMAL LOW (ref >60)
Glucose, Bld: 74 mg/dL (ref 70–99)
Potassium: 4.6 mmol/L (ref 3.5–5.1)
Sodium: 123 mmol/L — ABNORMAL LOW (ref 135–145)

## 2022-01-28 LAB — GLUCOSE, CAPILLARY
Glucose-Capillary: 87 mg/dL (ref 70–99)
Glucose-Capillary: 176 mg/dL — ABNORMAL HIGH (ref 70–99)
Glucose-Capillary: 146 mg/dL — ABNORMAL HIGH (ref 70–99)
Glucose-Capillary: 121 mg/dL — ABNORMAL HIGH (ref 70–99)

## 2022-01-28 LAB — MAGNESIUM: Magnesium: 2.4 mg/dL (ref 1.7–2.4)

## 2022-01-28 LAB — CULTURE, BLOOD (ROUTINE X 2)
Culture: NO GROWTH
Culture: NO GROWTH
Special Requests: ADEQUATE
Special Requests: ADEQUATE

## 2022-01-28 LAB — PROTIME-INR
INR: 2.5 — ABNORMAL HIGH (ref 0.8–1.2)
Prothrombin Time: 27.2 seconds — ABNORMAL HIGH (ref 11.4–15.2)

## 2022-01-28 LAB — CBC
HCT: 35.4 % — ABNORMAL LOW (ref 39.0–52.0)
Hemoglobin: 11.5 g/dL — ABNORMAL LOW (ref 13.0–17.0)
MCH: 25.7 pg — ABNORMAL LOW (ref 26.0–34.0)
MCHC: 32.5 g/dL (ref 30.0–36.0)
MCV: 79.2 fL — ABNORMAL LOW (ref 80.0–100.0)
Platelets: 139 10*3/uL — ABNORMAL LOW (ref 150–400)
RBC: 4.47 MIL/uL (ref 4.22–5.81)
RDW: 20.8 % — ABNORMAL HIGH (ref 11.5–15.5)
WBC: 5.6 10*3/uL (ref 4.0–10.5)
nRBC: 0 % (ref 0.0–0.2)

## 2022-01-28 LAB — SODIUM: Sodium: 123 mmol/L — ABNORMAL LOW (ref 135–145)

## 2022-01-28 MED ORDER — PROSOURCE PLUS PO LIQD
30.0000 mL | Freq: Two times a day (BID) | ORAL | Status: DC
  Administered 2022-01-29 – 2022-02-03 (×9): 30 mL via ORAL
  Filled 2022-01-28 (×9): qty 30

## 2022-01-28 MED ORDER — TOLVAPTAN 15 MG PO TABS
15.0000 mg | ORAL_TABLET | Freq: Once | ORAL | Status: DC
  Filled 2022-01-28: qty 1

## 2022-01-28 MED ORDER — LORAZEPAM 0.5 MG PO TABS
0.5000 mg | ORAL_TABLET | Freq: Three times a day (TID) | ORAL | Status: DC | PRN
  Administered 2022-01-28 – 2022-01-30 (×4): 0.5 mg via ORAL
  Filled 2022-01-28 (×4): qty 1

## 2022-01-28 MED ORDER — TOLVAPTAN 15 MG PO TABS
15.0000 mg | ORAL_TABLET | Freq: Once | ORAL | Status: AC
  Administered 2022-01-28: 15 mg via ORAL
  Filled 2022-01-28: qty 1

## 2022-01-28 MED ORDER — WARFARIN SODIUM 2.5 MG PO TABS
2.5000 mg | ORAL_TABLET | Freq: Once | ORAL | Status: AC
Start: 2022-01-28 — End: 2022-01-28
  Administered 2022-01-28: 2.5 mg via ORAL
  Filled 2022-01-28 (×2): qty 1

## 2022-01-28 NOTE — Progress Notes (Signed)
PT Cancellation Note  Patient Details Name: Lance Hicks MRN: [id number] DOB: 01-30-1946   Cancelled Treatment:    Reason Eval/Treat Not Completed: Fatigue/lethargy limiting ability to participate this afternoon. States his SBP needs to be over 110 to mobilize and that he feels current BP of 92/57 (68) is too low to attempt any activity at this time. Will continue to follow and attempt to progress mobility as able.   [name], PT, DPT   Acute Rehabilitation Department Pager #: [phone]   [name] 01/28/2022, 2:49 PM

## 2022-01-28 NOTE — Progress Notes (Signed)
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  Recently hospitalized at [facility] for pneumonia; treated with antibiotics in which his Lasix was held for 3 days for "low blood pressure".  Discharged home and patient started noticed increased swelling of his lower legs and he resumed his Lasix for the past 3 days however with no improvement.  Patient also reporting exertional dyspnea and was noted by his friend to be "very winded" and sent him to the hospital for further evaluation.  Denies cough, no fever/chills.  In the ED, BP was slightly hypotensive, found to be hypoxic and placed on 3 L nasal cannula.  Chest x-ray with bilateral pulmonary congestion.  Cardiology consulted and patient was given 80 mg IV Lasix.  Hospital service consulted for further evaluation management.   Assessment and Plan: Acute on chronic combined systolic and diastolic CHF (congestive heart failure) ([place])- (present on admission) Patient presenting to ED with progressive shortness of breath in the setting of his home Lasix.  Patient was found to be hypoxic requiring submental oxygen to maintain adequate SPO2.  BNP elevated 1831.  Chest x-ray with pulmonary edema, bilateral effusions greater on right. --Cardiology following, appreciate assistance --net negative 563m past 24h and net negative 8.5L since admission --Wt  59.4>57.2>55.5>55>55.5kg --toresemide 428mPO BID --Milrinone drip d/c'd for hypotension --Midodrine 15 mg p.o. TID --Spironolactone 25 mg p.o. daily --Supplemental oxygen remains off --Strict I's and O's and daily weights --Monitor renal function closely daily --Overall poor prognosis, seen by palliative care on 2/1 for assistance with goals of care and medical decision making, now DNR but plan to continue aggressive care with plan to return home with PT/OT  Chronic atrial fibrillation ([place] (present on admission) Rate controlled, not on beta-blocker/CCB outpatient. --Continue Coumadin, pharmacy consulted for dosing/monitoring (INR goal 2.5-3.0) --INR 2.5 today --continue to monitor  [name] --Continue Coumadin, pharmacy consulted for dosing/monitoring --Goal INR 2.5-3.0; 2.5 today --Continue monitor INR daily  Hypotension, chronic- (present on admission) --Continue midodrine 15 mg p.o. TID  Hyperlipidemia- (present on admission) --Crestor 5 mg p.o. daily  GERD (gastroesophageal reflux disease)- (present on admission) --Protonix 40 mg p.o. daily  Alcohol abuse- (present on admission) Counseled on need for cessation. --CIWA protocol with symptom triggered Ativan --Thiamine, multivitamin, folic acid  Iron deficiency anemia- (present on admission) Anemia panel with iron 164, TIBC 360, ferritin 38, folate 29.1, B12 2080. --Ferrous sulfate 325 mg p.o. every other day  Hyponatremia- (present on admission) Sodium 131 on admission, etiology likely secondary to hypervolemic hyponatremia in the setting of volume overload/CHF exacerbation as above. --Na 131>>>125>122>123 --Toresemide and spironolactone as above --BMP daily  Pulmonary arterial hypertension ([place] (present on admission) Home regimen includes tadalafil 40 mg p.o. every morning. --Selexipag 20053mPO BID  Hypokalemia, Hypomagnesmia Potassium 4.6, magnesium 2.4 this  morning --Continue to follow electrolytes daily  Physical deconditioning- (present on admission) Patient with significant weakness, debility, gait disturbance and deconditioning from recurrent hospitalization.  PT/OT currently recommends SNF placement,  patient declines and states has many friends including the [employer] community will assist him when he returns home. --Continue PT/OT efforts while inpatient, anticipate home with home health when ready for discharge  Protein-calorie malnutrition, severe ([place])- (present on admission) Body mass index is 18.26 kg/m. -- Dietitian consult        Subjective:  Patient seen examined bedside, resting comfortably.  States overall shortness of breath remarkably improved and about at his baseline.  Also reports his lower extremity edema also much improved.  Eating breakfast.  No family present this morning, daughter will be coming in this afternoon.  No other specific complaints or concerns at this time.  Continues to endorse that he has no intention to go to SNF and plan to return home with home health and assistance with his friends from the [employer] community.  Denies headache, no visual changes, no chest pain, no palpitations, no shortness of breath, no abdominal pain, no fatigue, no fever/chills/night sweats, no nausea/vomiting/diarrhea.  No acute events overnight per nursing staff.  Physical Exam: Vitals:   01/27/22 2000 01/28/22 0028 01/28/22 0341 01/28/22 0818  BP: 100/70 (!) _0  Pulse: (!) 51     Resp: _1 Temp: 97.6 F (36.4 C) (!) 97.2 F (36.2 C) (!) 97.4 F (36.3 C) 97.8 F (36.6 C)  TempSrc: Oral Oral Oral Oral  SpO2: 97% 95% 97%   Weight:   56.1 kg   Height:       Physical Exam GEN: 76 yo male in NAD, alert and oriented x 3, chronically ill, cachectic, thin in appearance, appears older than stated age HEENT: NCAT, PERRL, EOMI, sclera clear, mucous membranes dry PULM: CTAB w/o wheezes/crackles, normal  respiratory effort, on room air CV: RRR w/o M/G/R GI: abd soft, NTND, NABS, no R/G/M MSK: no peripheral edema, muscle strength globally intact 5/5 bilateral upper/lower extremities NEURO: CN II-XII intact, no focal deficits, sensation to light touch intact PSYCH: normal mood/affect Integumentary: Chronic venous changes with discoloration to bilateral lower legs, otherwise no concerning rashes/lesions/wounds.   Data Reviewed:  BMP and magnesium level reviewed this morning  Family Communication: No family present at bedside this morning, updated patient's daughter extensively via telephone yesterday afternoon and she will be coming to the hospital later today.  Disposition: Status is: Inpatient Remains inpatient appropriate because: Transition from IV furosemide to torsemide today, awaiting further cardiology recommendations.  Patient has declined SNF and plan to return home with home health.          Planned Discharge Destination: Home with Home Health     Time spent: 41 minutes spent on chart review, discussion with nursing staff, consultants, updating family and interview/physical exam; more than 50% of that time was spent in counseling and/or coordination of care.  Author: Donnamarie Poag [place] DO 01/28/2022 9:53 AM  For on call review [email or web address].

## 2022-01-28 NOTE — Plan of Care (Signed)
Problem: Activity: Goal: Capacity to carry out activities will improve Outcome: Progressing   Problem: Cardiac: Goal: Ability to achieve and maintain adequate cardiopulmonary perfusion will improve Outcome: Progressing   Problem: Safety: Goal: Ability to remain free from injury will improve Outcome: Progressing

## 2022-01-28 NOTE — Progress Notes (Addendum)
Advanced Heart Failure Rounding Note  PCP-Cardiologist: None   Subjective:   1/31 Started on milrinone 0.25 mcg + lasix 20 mg+ metolazone.  2/2 Milrinone stopped due to hypotension.   Milrinone stopped due to low BP.   Says he feels better today. More alert but still anxious. Denies CP or SOB.  Refusing SNF or Hospice.   Objective:   Weight Range: 56.1 kg Body mass index is 18.26 kg/m.   Vital Signs:   Temp:  [97.2 F (36.2 C)-97.8 F (36.6 C)] 97.6 F (36.4 C) (02/04 1045) Pulse Rate:  [51] 51 (02/03 2000) Resp:  [16-18] 17 (02/04 1045) BP: (89-100)/(56-70) 94/56 (02/04 1045) SpO2:  [95 %-98 %] 98 % (02/04 1045) Weight:  [56.1 kg] 56.1 kg (02/04 0341) Last BM Date: 01/26/22  Weight change: Filed Weights   01/26/22 0345 01/27/22 0400 01/28/22 0341  Weight: 55.5 kg 55 kg 56.1 kg    Intake/Output:   Intake/Output Summary (Last 24 hours) at 01/28/2022 1355 Last data filed at 01/28/2022 1244 Gross per 24 hour  Intake 847.29 ml  Output 1875 ml  Net -1027.71 ml       Physical Exam   General:  Terminally ill appearing. No resp difficulty HEENT: normal Neck: supple. JVP 8 Carotids 2+ bilat; no bruits. No lymphadenopathy or thryomegaly appreciated. Cor: PMI nondisplaced. Irregular rate & rhythm. Mechanical s2 Lungs: clear Abdomen: soft, nontender, nondistended. No hepatosplenomegaly. No bruits or masses. Good bowel sounds. Extremities: no cyanosis, clubbing, rash, tr edema in thighs Neuro: alert & orientedx3, cranial nerves grossly intact. moves all 4 extremities w/o difficulty. Affect pleasant   Telemetry   AF 50s Personally reviewed   Labs    CBC Recent Labs    01/27/22 0301 01/28/22 0351  WBC 5.9 5.6  HGB 11.9* 11.5*  HCT 35.3* 35.4*  MCV 77.8* 79.2*  PLT 137* 139*    Basic Metabolic Panel Recent Labs    01/27/22 0301 01/27/22 0601 01/28/22 0351  NA 123* 122* 123*  K 5.4* 4.7 4.6  CL 87* 88* 87*  CO2 _0 GLUCOSE 95 92 74   BUN 25* 26* 33*  CREATININE 1.66* 1.74* 1.99*  CALCIUM 9.6 9.4 9.5  MG 2.6*  --  2.4    Liver Function Tests Recent Labs    01/27/22 0301  AST 42*  ALT 17  ALKPHOS 76  BILITOT 2.1*  PROT 7.1  ALBUMIN 3.1*    No results for input(s): LIPASE, AMYLASE in the last 72 hours. Cardiac Enzymes No results for input(s): CKTOTAL, CKMB, CKMBINDEX, TROPONINI in the last 72 hours.  BNP: BNP (last 3 results) Recent Labs    09/30/21 0241 01/12/22 1524 01/23/22 1145  BNP 840.7* 1,341.7* 1,831.8*     ProBNP (last 3 results) Recent Labs    07/04/21 1607 08/16/21 1620  PROBNP 425.0* 601.0*      D-Dimer No results for input(s): DDIMER in the last 72 hours. Hemoglobin A1C No results for input(s): HGBA1C in the last 72 hours.  Fasting Lipid Panel No results for input(s): CHOL, HDL, LDLCALC, TRIG, CHOLHDL, LDLDIRECT in the last 72 hours. Thyroid Function Tests No results for input(s): TSH, T4TOTAL, T3FREE, THYROIDAB in the last 72 hours.  Invalid input(s): FREET3  Other results:   Imaging    No results found.   Medications:     Scheduled Medications:  aspirin EC  81 mg Oral q AM   docusate sodium  200 mg Oral BID   famotidine  10 mg Oral Daily   ferrous sulfate  325 mg Oral QODAY   folic acid  1 mg Oral Daily   insulin aspart  0-9 Units Subcutaneous TID WC   mouth rinse  15 mL Mouth Rinse BID   melatonin  5 mg Oral QHS   midodrine  15 mg Oral TID WC   multivitamin with minerals  1 tablet Oral Daily   pantoprazole  40 mg Oral Daily   polyethylene glycol  17 g Oral Daily   potassium chloride  40 mEq Oral Daily   rosuvastatin  5 mg Oral Daily   Selexipag  200 mcg Oral BID   sodium chloride flush  3 mL Intravenous Q12H   spironolactone  25 mg Oral Daily   thiamine  100 mg Oral Daily   Or   thiamine  100 mg Intravenous Daily   tiZANidine  2 mg Oral TID   torsemide  40 mg Oral BID   traZODone  50 mg Oral QHS   warfarin  2.5 mg Oral ONCE-1600   Warfarin  - Pharmacist Dosing Inpatient   Does not apply q1600    Infusions:  sodium chloride      PRN Medications: sodium chloride, acetaminophen, guaiFENesin-dextromethorphan, LORazepam, methocarbamol, ondansetron (ZOFRAN) IV, ondansetron, polyvinyl alcohol, simethicone, sodium chloride flush    Patient Profile   76 y/o old male with end-stage PAH/RV failure with chronic hypotension and hyponatremia. Also has CKD 3b, CAD s/p CABG/AVR.   Over past year multiple admits for volume overload and hyponatremia.    Recent had accidental ingestion of fuel injector cleaner. Also hospitalized at [facility].    Now presents with hypotension and volume overload   Assessment/Plan   1. A/C Biventricular Heart Failure, Predominant RV Failure   - Echo 10/22 EF 45-50% RV low normal  - Marked volume overload. BNP 1831. CXR with pulmonary edema.  - Brisk diuresis with milrinone 0.25 cmg + lasix drip + metolazone.  - Off milrinone due to hypotension - Volume status much improved, wt down 9  lb.  - IV lasix stopped yesterday. Home torsemide resumed today - Renal function trending up. Suspect this is a dry as we can get him. (Baseline Scr 1.7-2.0)   2. PAH  -WHO Group I & II - Auto-immune serologies negative (checked twice). - ? Component of HHT/shunt/AVMs with late bubbles on bubble study.  - Echo with bubble study (05/16/18): EF 55-60% with "late bubbles" , Mechanical AVR stable with trivial perivalvular regurg, Mild MR, Severe LAE, Severe RV dilation and reduced function. No PFO. Mod TR, PA peak pressure 68 mm Hg - Echo (4/22): EF 50-55%, RV ok, severe RVSP 61.6 mmHg, severe biatrial enlargement, moderate TR, stable AVR, AV mean gradient 11 mmHg - Echo 10/22 EF 45-50 RV low normal  - Ab u/s with cirrhosis but no evidence of portal HTN. - Off tadalafil and Macitentan w/ hypotension and fluid retention -Continue Uptravi.    3. Chronic A fib  - No longer on bb due to bradycardia.  - INR 2.5 -  Coumadin per pharmacy    4. Hypotension  - in setting of RV failure - continue midodrine 15 tid   5. Hyponatremia  - in setting of end-stage HF  - Sodium 130-->126->125->122 ->123 - Will give 1 dose tolvaptan - Restrict fluids    6. PVCs,   7. CAD  - s/p CABG 07/2017 with Dr Roderic Palau in [place] - No s/s angina   8. Leukocytoclastic Vasculitis - Followed  by Rheumatology    9. Cirrhosis US Abdomen RUQ 05/16/18 - s/p cholecystectomy. + hepatic cirrhosis. Mild ascites.  - Liver Doppler 05/16/18 - No hepatic, splenic, or portal venous thrombosis or occlusion. Mild ascites   9. S/P Mechanical AVR - Coumadin held on admit w/ supratherapeutic INR (4.1) - INR 2.5  today  - Continue coumadin, dosing per pharmacy   10. Hypokalemia/Hypomagnesemia - Stable  11 GOC:  - End-stage RV failure  - Palliative care following - DNR/DNI  - I spoke with him and his daughter. I think the best option for him is [facility]idential Hosp[facility] but he is refusing (has multiple animals at home including 140 pound Rottweiler). Has agreed to [facility]. I am not sure this will be sufficient support but he is trying to get an aide at home to help him.   Length of Stay: [place], MD  01/28/2022, 1:55 PM  Advanced Heart Failure Team Pager [phone] (M-F; 7a - 5p)  Please contact [facility] for night-coverage after hours (5p -7a ) and weekends on [email or web address]

## 2022-01-28 NOTE — Assessment & Plan Note (Addendum)
Body mass index is 18.26 kg/m.  Nutrition Status: Nutrition Problem: Increased nutrient needs Etiology: chronic illness (CHF) Signs/Symptoms: estimated needs Interventions: MVI, Prostat, Liberalize Diet, Refer to RD note for recommendations  Continue with  Nutritional supplements, patient is very weak and deconditioned.

## 2022-01-28 NOTE — Progress Notes (Signed)
ANTICOAGULATION CONSULT NOTE - Follow Up Consult  Pharmacy Consult for Warfarin Indication: mechanical AVR  Allergies  Allergen Reactions   Augmentin [Amoxicillin-Pot Clavulanate] Anaphylaxis and Diarrhea    "Upset stomach"   Pantoprazole Sodium Nausea Only    Gets gassy and starting itching like crazy Gets gassy and starting itching like crazy   Valium [Diazepam] Other (See Comments)    HA and Abd pain.   Tape Rash and Other (See Comments)    Surgical tape   Wound Dressing Adhesive Other (See Comments) and Rash    Surgical tape Surgical tape Surgical tape    Vital Signs: Temp: 97.4 F (36.3 C) (02/04 0341) Temp Source: Oral (02/04 0341) BP: 93/62 (02/04 0341)  Labs: Recent Labs    01/26/22 0244 01/27/22 0301 01/27/22 0601 01/28/22 0351  HGB 11.0* 11.9*  --  11.5*  HCT 33.7* 35.3*  --  35.4*  PLT 135* 137*  --  139*  LABPROT 29.0* 26.1*  --  27.2*  INR 2.7* 2.4*  --  2.5*  CREATININE 1.25* 1.66* 1.74* 1.99*     Estimated Creatinine Clearance: 25.8 mL/min (A) (by C-G formula based on SCr of 1.99 mg/dL (H)).   Medications:  Scheduled:   aspirin EC  81 mg Oral q AM   docusate sodium  200 mg Oral BID   famotidine  10 mg Oral Daily   ferrous sulfate  325 mg Oral QODAY   folic acid  1 mg Oral Daily   insulin aspart  0-9 Units Subcutaneous TID WC   mouth rinse  15 mL Mouth Rinse BID   melatonin  5 mg Oral QHS   midodrine  15 mg Oral TID WC   multivitamin with minerals  1 tablet Oral Daily   pantoprazole  40 mg Oral Daily   polyethylene glycol  17 g Oral Daily   potassium chloride  40 mEq Oral Daily   rosuvastatin  5 mg Oral Daily   Selexipag  200 mcg Oral BID   sodium chloride flush  3 mL Intravenous Q12H   spironolactone  25 mg Oral Daily   thiamine  100 mg Oral Daily   Or   thiamine  100 mg Intravenous Daily   tiZANidine  2 mg Oral TID   torsemide  40 mg Oral BID   traZODone  50 mg Oral QHS   Warfarin - Pharmacist Dosing Inpatient   Does not apply  q1600    Assessment: 76 y.o. male with medical history significant of chronic systolic CHF NYHA class III, HTN, CAD and aortic stenosis status post CABG and mechanical AVR on Coumadin, orthostatic hypotension, COPD and pulmonary hypertension, IIDM, PAF, liver cirrhosis secondary to alcohol abuse, presented with increasing leg swelling.  Home regimen per patient: warfarin 40m MonWed and warfarin 2.527mall other days.   INR 2.5 this morning. No bleeding issues noted.  CBC stable.  Goal of Therapy:  INR goal 2.5-3.0 (per clinic note) Monitor platelets by anticoagulation protocol: Yes   Plan:  Warfarin 2.5 mg tonight  Daily INR, s/s bleeding  Thank you for allowing pharmacy to participate in this patient's care.  NaReatha HarpsPharmD PGY1 Pharmacy Resident 01/28/2022 8:18 AM Check [email or web address] for unit specific pharmacy number

## 2022-01-28 NOTE — Progress Notes (Signed)
Initial Nutrition Assessment  DOCUMENTATION CODES:   Underweight  INTERVENTION:  -MVI with minerals daily -double protein portions on all meal trays -PROSource PLUS PO 36ms BID, each supplement provides 100 kcals and 15 grams of protein -recommend 2gm Na diet due to need for additional kcals/protein  NUTRITION DIAGNOSIS:   Increased nutrient needs related to chronic illness (CHF) as evidenced by estimated needs.  GOAL:   Patient will meet greater than or equal to 90% of their needs  MONITOR:   PO intake, Weight trends, Labs, I & O's  REASON FOR ASSESSMENT:   Consult Assessment of nutrition requirement/status  ASSESSMENT:   Pt with PMH significant for CHF NYHA class III, HTN, CAD s/p CABG, aortic stenosis s/p aortic valve replacement, orthostatic hypotension, COPD, type 2 DM, Afib, cirrhosis 2/2 EtOH abuse admitted with progressive lower extremity edema 2/2 acute on chronic CHF  Per MD, pt declining SNF and wishes to discharge home with home health assistance.   RD working remotely. Pt reports appetite has been varied since admit but that it is improving now that shortness of breath has improved. 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 Endorses drinking a lot of electrolyte drinks (I.e. Body Armour and Gatorade). Endorses adhering to a low-sodium diet to the best of his abilities at home. Pt received reinforcement of low sodium diet education during previous admission in Oct 2022.  Pt states that he does not drink oral nutrition supplements due to being on coumadin.   Reports UBW is between 135-140 lbs. Currently ~123lbs. According to available weight readings, pt weighed 64 kg on 01/03/22 and now weighs 56.1 kg, indicating a clinically significant 12.3% wt loss x<1 month. Pt previously met criteria for severe malnutrition and likely continues to meet  criteria given continued weight loss, though unable to confirm diagnosis at this time without nutrition-focused physical exam. Will attempt at follow-up. Note pt is underweight based on BMI of 18.26.   PO Intake: 0-100% x last 8 recorded meals (~52% avg meal intake)  UOP: [id number] hours I/O: -[id number] admit  Medications: colace, pepcid, ferrous sulfate, folvite, SSI TID w/ meals, mvi with minerals, protonix, miralax, klor-con, aldactone, thiamine, warfarin  Labs: Recent Labs  Lab 01/26/22 0244 01/27/22 0301 01/27/22 0601 01/28/22 0351  NA 125* 123* 122* 123*  K 3.5 5.4* 4.7 4.6  CL 87* 87* 88* 87*  CO2 _0 BUN 17 25* 26* 33*  CREATININE 1.25* 1.66* 1.74* 1.99*  CALCIUM 9.6 9.6 9.4 9.5  MG 1.6* 2.6*  --  2.4  GLUCOSE 104* 95 92 74  CBGs: 176-176-87-110    NUTRITION - FOCUSED PHYSICAL EXAM: Unable to complete at this time. Will attempt at follow-up. Note previous NFPE completed in Oct 2022 revealed severe muscle and fat depletions.   Diet Order:   Diet Order             Diet heart healthy/carb modified Room service appropriate? Yes; Fluid consistency: Thin; Fluid restriction: 1800 mL Fluid  Diet effective now                   EDUCATION NEEDS:   No education needs have been identified at this time  Skin:  Skin Assessment: Reviewed RN Assessment  Last BM:  2/2  Height:   Ht Readings from Last 1 Encounters:  01/24/22 _1  (1.753 m)  Weight:   Wt Readings from Last 1 Encounters:  01/28/22 56.1 kg    BMI:  Body mass index is 18.26 kg/m.  Estimated Nutritional Needs:   Kcal:  1900-2100  Protein:  95-105 grams  Fluid:  >1.9L/d     Theone Stanley., MS, RD, LDN (she/her/hers) RD pager number and weekend/on-call pager number located in [place].

## 2022-01-28 NOTE — Progress Notes (Signed)
°   01/28/22 1600  Assess: MEWS Score  BP (!) 83/62  ECG Heart Rate (!) 44  Level of Consciousness Alert  Assess: MEWS Score  MEWS Temp 0  MEWS Systolic 1  MEWS Pulse 1  MEWS RR 0  MEWS LOC 0  MEWS Score 2  MEWS Score Color Yellow  Assess: if the MEWS score is Yellow or Red  Were vital signs taken at a resting state? Yes  Focused Assessment No change from prior assessment  Early Detection of Sepsis Score *See Row Information* Low  MEWS guidelines implemented *See Row Information* No, previously yellow, continue vital signs every 4 hours  Treat  MEWS Interventions Other (Comment)  Pain Scale 0-10  Pain Score 0  Escalate  MEWS: Escalate Yellow: discuss with charge nurse/RN and consider discussing with provider and RRT  Notify: Charge Nurse/RN  Name of Charge Nurse/RN Notified Melva  Date Charge Nurse/RN Notified 01/28/22  Time Charge Nurse/RN Notified 1616  Notify: Provider  Provider Name/Title [place] Eric DO  Date Provider Notified 01/28/22  Time Provider Notified 1636  Notification Type Page  Notification Reason Other (Comment)  Provider response No new orders  Date of Provider Response 01/28/22  Time of Provider Response 1640  Document  Patient Outcome Not stable and remains on department  Progress note created (see row info) Yes   Will continue to monitor.

## 2022-01-29 DIAGNOSIS — E871 Hypo-osmolality and hyponatremia: Secondary | ICD-10-CM | POA: Diagnosis not present

## 2022-01-29 DIAGNOSIS — I9589 Other hypotension: Secondary | ICD-10-CM | POA: Diagnosis not present

## 2022-01-29 DIAGNOSIS — I482 Chronic atrial fibrillation, unspecified: Secondary | ICD-10-CM | POA: Diagnosis not present

## 2022-01-29 DIAGNOSIS — I5043 Acute on chronic combined systolic (congestive) and diastolic (congestive) heart failure: Secondary | ICD-10-CM | POA: Diagnosis not present

## 2022-01-29 DIAGNOSIS — Z952 Presence of prosthetic heart valve: Secondary | ICD-10-CM | POA: Diagnosis not present

## 2022-01-29 LAB — BASIC METABOLIC PANEL
Anion gap: 11 (ref 5–15)
BUN: 35 mg/dL — ABNORMAL HIGH (ref 8–23)
CO2: 27 mmol/L (ref 22–32)
Calcium: 9.3 mg/dL (ref 8.9–10.3)
Chloride: 88 mmol/L — ABNORMAL LOW (ref 98–111)
Creatinine, Ser: 1.98 mg/dL — ABNORMAL HIGH (ref 0.61–1.24)
GFR, Estimated: 35 mL/min — ABNORMAL LOW (ref >60)
Glucose, Bld: 93 mg/dL (ref 70–99)
Potassium: 3.7 mmol/L (ref 3.5–5.1)
Sodium: 126 mmol/L — ABNORMAL LOW (ref 135–145)

## 2022-01-29 LAB — CBC
HCT: 35.3 % — ABNORMAL LOW (ref 39.0–52.0)
Hemoglobin: 11.8 g/dL — ABNORMAL LOW (ref 13.0–17.0)
MCH: 26.2 pg (ref 26.0–34.0)
MCHC: 33.4 g/dL (ref 30.0–36.0)
MCV: 78.4 fL — ABNORMAL LOW (ref 80.0–100.0)
Platelets: 153 10*3/uL (ref 150–400)
RBC: 4.5 MIL/uL (ref 4.22–5.81)
RDW: 21 % — ABNORMAL HIGH (ref 11.5–15.5)
WBC: 6.1 10*3/uL (ref 4.0–10.5)
nRBC: 0 % (ref 0.0–0.2)

## 2022-01-29 LAB — GLUCOSE, CAPILLARY
Glucose-Capillary: 89 mg/dL (ref 70–99)
Glucose-Capillary: 161 mg/dL — ABNORMAL HIGH (ref 70–99)
Glucose-Capillary: 132 mg/dL — ABNORMAL HIGH (ref 70–99)
Glucose-Capillary: 94 mg/dL (ref 70–99)

## 2022-01-29 LAB — PROTIME-INR
INR: 3.4 — ABNORMAL HIGH (ref 0.8–1.2)
Prothrombin Time: 34.6 seconds — ABNORMAL HIGH (ref 11.4–15.2)

## 2022-01-29 LAB — MAGNESIUM: Magnesium: 2 mg/dL (ref 1.7–2.4)

## 2022-01-29 MED ORDER — POLYETHYLENE GLYCOL 3350 17 G PO PACK: 17.0000 g | PACK | Freq: Every day | ORAL | Status: DC | PRN

## 2022-01-29 MED ORDER — MELATONIN 3 MG PO TABS
6.0000 mg | ORAL_TABLET | Freq: Every day | ORAL | Status: DC
  Administered 2022-01-29 – 2022-02-04 (×7): 6 mg via ORAL
  Filled 2022-01-29 (×8): qty 2

## 2022-01-29 MED ORDER — WARFARIN SODIUM 1 MG PO TABS
1.0000 mg | ORAL_TABLET | Freq: Once | ORAL | Status: AC
  Administered 2022-01-29: 1 mg via ORAL
  Filled 2022-01-29: qty 1

## 2022-01-29 NOTE — Progress Notes (Signed)
Mobility Specialist Progress Note    01/29/22 1346  Mobility  Activity Ambulated with assistance in hallway  Level of Assistance Contact guard assist, steadying assist  Assistive Device Front wheel walker  Distance Ambulated (ft) 340 ft  Activity Response Tolerated fair  $Mobility charge 1 Mobility   Pt received and agreeable to attempt BM in BR. Walked with no complaints. Declined sitting in chair. Returned to sitting EOB with call bell in reach and RN present.   [facility] Mobility Specialist  M.S. 2C and 6E: [phone] M.S. 4E: [phone]

## 2022-01-29 NOTE — Progress Notes (Signed)
ANTICOAGULATION CONSULT NOTE - Follow Up Consult  Pharmacy Consult for Warfarin Indication: mechanical AVR  Allergies  Allergen Reactions   Augmentin [Amoxicillin-Pot Clavulanate] Anaphylaxis and Diarrhea    "Upset stomach"   Pantoprazole Sodium Nausea Only    Gets gassy and starting itching like crazy Gets gassy and starting itching like crazy   Valium [Diazepam] Other (See Comments)    HA and Abd pain.   Tape Rash and Other (See Comments)    Surgical tape   Wound Dressing Adhesive Other (See Comments) and Rash    Surgical tape Surgical tape Surgical tape    Vital Signs: Temp: 97.7 F (36.5 C) (02/05 0712) Temp Source: Oral (02/05 0712) BP: 94/62 (02/05 0712) Pulse Rate: 47 (02/05 0352)  Labs: Recent Labs    01/27/22 0301 01/27/22 0601 01/28/22 0351 01/29/22 0328  HGB 11.9*  --  11.5* 11.8*  HCT 35.3*  --  35.4* 35.3*  PLT 137*  --  139* 153  LABPROT 26.1*  --  27.2* 34.6*  INR 2.4*  --  2.5* 3.4*  CREATININE 1.66* 1.74* 1.99* 1.98*     Estimated Creatinine Clearance: 25.3 mL/min (A) (by C-G formula based on SCr of 1.98 mg/dL (H)).   Medications:  Scheduled:   (feeding supplement) PROSource Plus  30 mL Oral BID BM   aspirin EC  81 mg Oral q AM   docusate sodium  200 mg Oral BID   famotidine  10 mg Oral Daily   ferrous sulfate  325 mg Oral QODAY   folic acid  1 mg Oral Daily   insulin aspart  0-9 Units Subcutaneous TID WC   mouth rinse  15 mL Mouth Rinse BID   melatonin  6 mg Oral QHS   midodrine  15 mg Oral TID WC   multivitamin with minerals  1 tablet Oral Daily   pantoprazole  40 mg Oral Daily   polyethylene glycol  17 g Oral Daily   potassium chloride  40 mEq Oral Daily   rosuvastatin  5 mg Oral Daily   Selexipag  200 mcg Oral BID   sodium chloride flush  3 mL Intravenous Q12H   spironolactone  25 mg Oral Daily   thiamine  100 mg Oral Daily   Or   thiamine  100 mg Intravenous Daily   tiZANidine  2 mg Oral TID   torsemide  40 mg Oral BID    traZODone  50 mg Oral QHS   Warfarin - Pharmacist Dosing Inpatient   Does not apply q1600    Assessment: 76 y.o. male with medical history significant of chronic systolic CHF NYHA class III, HTN, CAD and aortic stenosis status post CABG and mechanical AVR on Coumadin, orthostatic hypotension, COPD and pulmonary hypertension, IIDM, PAF, liver cirrhosis secondary to alcohol abuse, presented with increasing leg swelling.  Home regimen per patient: warfarin 22m MonWed and warfarin 2.568mall other days.   INR increased to 3.4 this morning reflecting the 5 mg dose. Suspect the patient will likely be stable on 2.5 mg after giving a low dose today. No bleeding issues reported per RN.  CBC stable.  Goal of Therapy:  INR goal 2.5-3.0 (per clinic note) Monitor platelets by anticoagulation protocol: Yes   Plan:  Warfarin 1 mg tonight  Daily INR, s/s bleeding  Thank you for allowing pharmacy to participate in this patient's care.  NaReatha HarpsPharmD PGY1 Pharmacy Resident 01/29/2022 8:19 AM Check [email or web address] for unit specific pharmacy number

## 2022-01-29 NOTE — Progress Notes (Addendum)
Progress Note   Patient: Lance Hicks AVW:[id number] DOB: 12-19-1946 DOA: 01/23/2022     6 DOS: the patient was seen and examined on 01/29/2022   Brief hospital course: AMROM ORE is a 76 year old male with past medical history significant for chronic systolic congestive heart failure NYHA class III, essential pretension, CAD s/p CABG, aortic stenosis s/p aortic valve replacement on Coumadin, orthostatic hypotension, COPD, pulmonary hypertension, type 2 diabetes mellitus, paroxysmal atrial fibrillation, cirrhosis secondary to EtOH abuse who presented to [facility] ED on 1/30 with progressive lower extremity edema.  Recently hospitalized at [facility] for pneumonia; treated with antibiotics in which his Lasix was held for 3 days for "low blood pressure".  Discharged home and patient started noticed increased swelling of his lower legs and he resumed his Lasix for the past 3 days however with no improvement.  Patient also reporting exertional dyspnea and was noted by his friend to be "very winded" and sent him to the hospital for further evaluation.  Denies cough, no fever/chills.  In the ED, BP was slightly hypotensive, found to be hypoxic and placed on 3 L nasal cannula.  Chest x-ray with bilateral pulmonary congestion.  Cardiology consulted and patient was given 80 mg IV Lasix.  Hospital service consulted for further evaluation management.   Assessment and Plan: Acute on chronic combined systolic and diastolic CHF (congestive heart failure) ([place])- (present on admission) Patient presenting to ED with progressive shortness of breath in the setting of his home Lasix.  Patient was found to be hypoxic requiring submental oxygen to maintain adequate SPO2.  BNP elevated 1831.  Chest x-ray with pulmonary edema, bilateral effusions greater on right. --Cardiology following, appreciate assistance --net negative 2.0L past 24h and net negative 10.5L since admission --Wt  59.4>57.2>55.5>55>55.>54.75kg --toresemide 26m PO BID (last night dose held for hypotension) --Milrinone drip d/c'd for hypotension --Midodrine 15 mg p.o. TID --Spironolactone 25 mg p.o. daily --Supplemental oxygen remains off --Strict I's and O's and daily weights --Monitor renal function closely daily --Overall poor prognosis, seen by palliative care on 2/1 for assistance with goals of care and medical decision making, now DNR but plan to continue aggressive care with plan to return home with PT/OT  Chronic atrial fibrillation ([place]- (present on admission) Rate controlled, not on beta-blocker/CCB outpatient. --Continue Coumadin, pharmacy consulted for dosing/monitoring (INR goal 2.5-3.0) --INR 3.4 today --continue to monitor  [name] --Continue Coumadin, pharmacy consulted for dosing/monitoring --Goal INR 2.5-3.0; 3.4 today --Continue monitor INR daily  Hypotension, chronic- (present on admission) --Continue midodrine 15 mg p.o. TID  Hyperlipidemia- (present on admission) --Crestor 5 mg p.o. daily  GERD (gastroesophageal reflux disease)- (present on admission) --Protonix 40 mg p.o. daily  Alcohol abuse- (present on admission) Counseled on need for cessation. --CIWA protocol with symptom triggered Ativan --Thiamine, multivitamin, folic acid  Iron deficiency anemia- (present on admission) Anemia panel with iron 164, TIBC 360, ferritin 38, folate 29.1, B12 2080. --Ferrous sulfate 325 mg p.o. every other day  Hyponatremia- (present on admission) Sodium 131 on admission, etiology likely secondary to hypervolemic hyponatremia in the setting of volume overload/CHF exacerbation as above. --Na 131>>>125>122>123>126 --Toresemide and spironolactone as above --BMP daily  Pulmonary arterial hypertension ([place]- (present on admission) Home regimen includes tadalafil 40 mg p.o. every morning. --Selexipag 2049m PO BID  Hypokalemia,  Hypomagnesmia Potassium 3.7, magnesium 2.0 this morning; will replete potassium --goal K >4.0 and Mag >2.0 --Continue to follow electrolytes daily  Physical deconditioning- (present on admission) Patient with significant  weakness, debility, gait disturbance and deconditioning from recurrent hospitalization.  PT/OT currently recommends SNF placement, patient declines and states has many friends including the [employer] community will assist him when he returns home. --Continue PT/OT efforts while inpatient, anticipate home with home health when ready for discharge  Protein-calorie malnutrition, severe ([place])- (present on admission) Body mass index is 18.26 kg/m.  Nutrition Status: Nutrition Problem: Increased nutrient needs Etiology: chronic illness (CHF) Signs/Symptoms: estimated needs Interventions: MVI, Prostat, Liberalize Diet, Refer to RD note for recommendations -- Dietitian following, continue to encourage increased oral/protein intake, supplementation        Subjective:  Patient seen examined bedside, resting comfortably.  Lying in bed.  Eating breakfast.  Having difficulty opening milk container, request assistance.  States "continues to sleep poorly overnight".  "Can you give me something for sleep during the day".  Discussed with patient he needs to sleep at nighttime and remain awake during the day.  Overall his breathing and lower extremity edema much improved.  Torsemide held last night due to hypotension.  No other specific complaints or concerns at this time.  Denies headache, no dizziness, no chest pain, no palpitations, no shortness of breath, no abdominal pain, no fever/chills/night sweats, no nausea/vomiting/diarrhea, no fatigue, no paresthesias.  No other acute events overnight per nurse staff.  Physical Exam: Vitals:   01/28/22 1807 01/28/22 2000 01/29/22 0352 01/29/22 0712  BP: (!) 87/58 95/61 (!) 90/57 94/62  Pulse:  (!) 51 (!) 47   Resp:  _0 Temp:  (!) 97.5  F (36.4 C) 97.6 F (36.4 C) 97.7 F (36.5 C)  TempSrc:  Oral Oral Oral  SpO2:  98%  98%  Weight:   54.7 kg   Height:       Physical Exam GEN: 76 yo male in NAD, alert and oriented x 3, chronically ill/cachectic, thin in appearance, appears older than stated age HEENT: NCAT, PERRL, EOMI, sclera clear, dry mucous membranes PULM: CTAB w/o wheezes/crackles, normal respiratory effort, on room air CV: RRR w/o M/G/R GI: abd soft, NTND, NABS, no R/G/M MSK: no peripheral edema, muscle strength globally intact 5/5 bilateral upper/lower extremities NEURO: CN II-XII intact, no focal deficits, sensation to light touch intact PSYCH: normal mood/affect Integumentary: Chronic venous changes with discoloration to bilateral lower legs, otherwise no concerning rash/lesions/wounds.     Data Reviewed:  Reviewed BMP, INR this morning  Family Communication: No family present at bedside this morning, updated patient's daughter extensively at bedside yesterday afternoon  Disposition: Status is: Inpatient  Remains inpatient appropriate because: Awaiting cardiology signed off, overall very poor prognosis given his terminal heart failure and would be hospice appropriate, although plan likely to return home with home health and outpatient palliative care to follow given patient's wishes.    Planned Discharge Destination: Home with Home Health     Time spent: 45 minutes spent on chart review, discussion with nursing staff, consultants, updating family and interview/physical exam; more than 50% of that time was spent in counseling and/or coordination of care.  Author: Lakelyn Straus J [place] DO 01/29/2022 9:35 AM  For on call review [email or web address].

## 2022-01-29 NOTE — Progress Notes (Signed)
Advanced Heart Failure Rounding Note  PCP-Cardiologist: None   Subjective:   1/31 Started on milrinone 0.25 mcg + lasix 20 mg+ metolazone.  2/2 Milrinone stopped due to hypotension.   Milrinone stopped due to low BP.   Remains weak and anxious. Weight down another 3 pounds on po torsemide (10 pounds total)  Given tolvaptan yesterday for hyponatremia Na 126 -> 123. Evening torsemide held due to hypotension. Scr stable at 1.98   Objective:   Weight Range: 54.7 kg Body mass index is 17.81 kg/m.   Vital Signs:   Temp:  [97.5 F (36.4 C)-97.7 F (36.5 C)] 97.7 F (36.5 C) (02/05 0712) Pulse Rate:  [47-51] 47 (02/05 0352) Resp:  [17] 17 (02/05 0712) BP: (83-95)/(56-62) 94/62 (02/05 0712) SpO2:  [98 %] 98 % (02/05 0712) Weight:  [54.7 kg] 54.7 kg (02/05 0352) Last BM Date: 01/26/22  Weight change: Filed Weights   01/27/22 0400 01/28/22 0341 01/29/22 0352  Weight: 55 kg 56.1 kg 54.7 kg    Intake/Output:   Intake/Output Summary (Last 24 hours) at 01/29/2022 0912 Last data filed at 01/29/2022 0551 Gross per 24 hour  Intake 485 ml  Output 2650 ml  Net -2165 ml       Physical Exam   General:  Terminally ill appearing. No resp difficulty HEENT: normal Neck: supple. JVP 8-9 Carotids 2+ bilat; no bruits. No lymphadenopathy or thryomegaly appreciated. Cor: PMI nondisplaced. Irregular rate & rhythm. Mechanical s2 Lungs: clear Abdomen: soft, nontender, nondistended. No hepatosplenomegaly. No bruits or masses. Good bowel sounds. Extremities: no cyanosis, clubbing, rash, tr edema Neuro: alert & orientedx3, cranial nerves grossly intact. moves all 4 extremities w/o difficulty. Affect pleasant    Telemetry   AF 40-50s Personally reviewed   Labs    CBC Recent Labs    01/28/22 0351 01/29/22 0328  WBC 5.6 6.1  HGB 11.5* 11.8*  HCT 35.4* 35.3*  MCV 79.2* 78.4*  PLT 139* 852    Basic Metabolic Panel Recent Labs    01/28/22 0351 01/28/22 2106 01/29/22 0328   NA 123* 123* 126*  K 4.6  --  3.7  CL 87*  --  88*  CO2 26  --  27  GLUCOSE 74  --  93  BUN 33*  --  35*  CREATININE 1.99*  --  1.98*  CALCIUM 9.5  --  9.3  MG 2.4  --  2.0    Liver Function Tests Recent Labs    01/27/22 0301  AST 42*  ALT 17  ALKPHOS 76  BILITOT 2.1*  PROT 7.1  ALBUMIN 3.1*    No results for input(s): LIPASE, AMYLASE in the last 72 hours. Cardiac Enzymes No results for input(s): CKTOTAL, CKMB, CKMBINDEX, TROPONINI in the last 72 hours.  BNP: BNP (last 3 results) Recent Labs    09/30/21 0241 01/12/22 1524 01/23/22 1145  BNP 840.7* 1,341.7* 1,831.8*     ProBNP (last 3 results) Recent Labs    07/04/21 1607 08/16/21 1620  PROBNP 425.0* 601.0*      D-Dimer No results for input(s): DDIMER in the last 72 hours. Hemoglobin A1C No results for input(s): HGBA1C in the last 72 hours.  Fasting Lipid Panel No results for input(s): CHOL, HDL, LDLCALC, TRIG, CHOLHDL, LDLDIRECT in the last 72 hours. Thyroid Function Tests No results for input(s): TSH, T4TOTAL, T3FREE, THYROIDAB in the last 72 hours.  Invalid input(s): FREET3  Other results:   Imaging    No results found.   Medications:  Scheduled Medications:  (feeding supplement) PROSource Plus  30 mL Oral BID BM   aspirin EC  81 mg Oral q AM   docusate sodium  200 mg Oral BID   famotidine  10 mg Oral Daily   ferrous sulfate  325 mg Oral QODAY   folic acid  1 mg Oral Daily   insulin aspart  0-9 Units Subcutaneous TID WC   mouth rinse  15 mL Mouth Rinse BID   melatonin  6 mg Oral QHS   midodrine  15 mg Oral TID WC   multivitamin with minerals  1 tablet Oral Daily   pantoprazole  40 mg Oral Daily   polyethylene glycol  17 g Oral Daily   potassium chloride  40 mEq Oral Daily   rosuvastatin  5 mg Oral Daily   Selexipag  200 mcg Oral BID   sodium chloride flush  3 mL Intravenous Q12H   spironolactone  25 mg Oral Daily   thiamine  100 mg Oral Daily   Or   thiamine  100 mg  Intravenous Daily   tiZANidine  2 mg Oral TID   torsemide  40 mg Oral BID   traZODone  50 mg Oral QHS   warfarin  1 mg Oral ONCE-1600   Warfarin - Pharmacist Dosing Inpatient   Does not apply q1600    Infusions:  sodium chloride      PRN Medications: sodium chloride, acetaminophen, guaiFENesin-dextromethorphan, LORazepam, methocarbamol, ondansetron (ZOFRAN) IV, ondansetron, polyvinyl alcohol, simethicone, sodium chloride flush    Patient Profile   76 y/o old male with end-stage PAH/RV failure with chronic hypotension and hyponatremia. Also has CKD 3b, CAD s/p CABG/AVR.   Over past year multiple admits for volume overload and hyponatremia.    Recent had accidental ingestion of fuel injector cleaner. Also hospitalized at [facility].    Now presents with hypotension and volume overload   Assessment/Plan   1. A/C Biventricular Heart Failure, Predominant RV Failure   - Echo 10/22 EF 45-50% RV low normal  - Marked volume overload. BNP 1831. CXR with pulmonary edema.  - Brisk diuresis with milrinone 0.25 cmg + lasix drip + metolazone.  - Off milrinone due to hypotension - Volume status much improved, wt down 10  lb.  - Back on home dose torsemide. Also received tolvaptan on 2/4 - Renal function stabilized ~2.0 (Baseline 1.7-2.0)   2. PAH  -WHO Group I & II - Auto-immune serologies negative (checked twice). - ? Component of HHT/shunt/AVMs with late bubbles on bubble study.  - Echo with bubble study (05/16/18): EF 55-60% with "late bubbles" , Mechanical AVR stable with trivial perivalvular regurg, Mild MR, Severe LAE, Severe RV dilation and reduced function. No PFO. Mod TR, PA peak pressure 68 mm Hg - Echo (4/22): EF 50-55%, RV ok, severe RVSP 61.6 mmHg, severe biatrial enlargement, moderate TR, stable AVR, AV mean gradient 11 mmHg - Echo 10/22 EF 45-50 RV low normal  - Ab u/s with cirrhosis but no evidence of portal HTN. - Off tadalafil and Macitentan w/ hypotension and  fluid retention -Continue Uptravi.    3. Chronic A fib  - No longer on bb due to bradycardia.  - INR 3.4 - Coumadin per pharmacy. Discussed dosing with PharmD personally.   4. Hypotension  - in setting of RV failure - continue midodrine 15 tid   5. Hyponatremia  - in setting of end-stage HF  - Sodium 130-->126->125->122 ->123 -> tolvaptan -> 126 - Restrict fluids  6. PVCs,   7. CAD  - s/p CABG 07/2017 with Dr Roderic Palau in [place] - No s/s angina   8. Leukocytoclastic Vasculitis - Followed by Rheumatology    9. Cirrhosis US Abdomen RUQ 05/16/18 - s/p cholecystectomy. + hepatic cirrhosis. Mild ascites.  - Liver Doppler 05/16/18 - No hepatic, splenic, or portal venous thrombosis or occlusion. Mild ascites   9. S/P Mechanical AVR - Coumadin held on admit w/ supratherapeutic INR (4.1) - INR 3.4  today  - Continue coumadin, dosing per pharmacy   10. Hypokalemia/Hypomagnesemia - See above  60 GOC:  - End-stage RV failure  - Palliative care following - DNR/DNI  - I spoke with him and his daughter yesterday. I think the best option for him is [facility] but he is refusing (has multiple animals at home including 140 pound Rottweiler). Has agreed to [facility]. I am not sure this will be sufficient support but he is trying to get an aide at home to help him. Will d/w SW and CM in am to determine most appropriate dispo  Length of Stay: [place], [place]  01/29/2022, 9:12 AM  Advanced Heart Failure Team Pager [phone] (M-F; 7a - 5p)  Please contact [facility] for night-coverage after hours (5p -7a ) and weekends on [email or web address]

## 2022-01-30 DIAGNOSIS — Z952 Presence of prosthetic heart valve: Secondary | ICD-10-CM | POA: Diagnosis not present

## 2022-01-30 DIAGNOSIS — I9589 Other hypotension: Secondary | ICD-10-CM | POA: Diagnosis not present

## 2022-01-30 DIAGNOSIS — I482 Chronic atrial fibrillation, unspecified: Secondary | ICD-10-CM | POA: Diagnosis not present

## 2022-01-30 DIAGNOSIS — I5043 Acute on chronic combined systolic (congestive) and diastolic (congestive) heart failure: Secondary | ICD-10-CM | POA: Diagnosis not present

## 2022-01-30 DIAGNOSIS — E871 Hypo-osmolality and hyponatremia: Secondary | ICD-10-CM | POA: Diagnosis not present

## 2022-01-30 LAB — BASIC METABOLIC PANEL
Anion gap: 11 (ref 5–15)
BUN: 29 mg/dL — ABNORMAL HIGH (ref 8–23)
CO2: 30 mmol/L (ref 22–32)
Calcium: 9.2 mg/dL (ref 8.9–10.3)
Chloride: 87 mmol/L — ABNORMAL LOW (ref 98–111)
Creatinine, Ser: 1.47 mg/dL — ABNORMAL HIGH (ref 0.61–1.24)
GFR, Estimated: 50 mL/min — ABNORMAL LOW (ref >60)
Glucose, Bld: 92 mg/dL (ref 70–99)
Potassium: 3.8 mmol/L (ref 3.5–5.1)
Sodium: 128 mmol/L — ABNORMAL LOW (ref 135–145)

## 2022-01-30 LAB — CBC
HCT: 38.4 % — ABNORMAL LOW (ref 39.0–52.0)
Hemoglobin: 12.1 g/dL — ABNORMAL LOW (ref 13.0–17.0)
MCH: 25.3 pg — ABNORMAL LOW (ref 26.0–34.0)
MCHC: 31.5 g/dL (ref 30.0–36.0)
MCV: 80.2 fL (ref 80.0–100.0)
Platelets: 196 10*3/uL (ref 150–400)
RBC: 4.79 MIL/uL (ref 4.22–5.81)
RDW: 21.2 % — ABNORMAL HIGH (ref 11.5–15.5)
WBC: 6.1 10*3/uL (ref 4.0–10.5)
nRBC: 0 % (ref 0.0–0.2)

## 2022-01-30 LAB — GLUCOSE, CAPILLARY
Glucose-Capillary: 74 mg/dL (ref 70–99)
Glucose-Capillary: 80 mg/dL (ref 70–99)
Glucose-Capillary: 144 mg/dL — ABNORMAL HIGH (ref 70–99)
Glucose-Capillary: 103 mg/dL — ABNORMAL HIGH (ref 70–99)
Glucose-Capillary: 142 mg/dL — ABNORMAL HIGH (ref 70–99)

## 2022-01-30 LAB — PROTIME-INR
INR: 3.4 — ABNORMAL HIGH (ref 0.8–1.2)
Prothrombin Time: 34.2 seconds — ABNORMAL HIGH (ref 11.4–15.2)

## 2022-01-30 LAB — MAGNESIUM: Magnesium: 2 mg/dL (ref 1.7–2.4)

## 2022-01-30 MED ORDER — MUSCLE RUB 10-15 % EX CREA
TOPICAL_CREAM | CUTANEOUS | Status: DC | PRN
  Administered 2022-01-30: 1 via TOPICAL
  Filled 2022-01-30: qty 85

## 2022-01-30 NOTE — Plan of Care (Signed)
Problem: Activity: Goal: Capacity to carry out activities will improve Outcome: Progressing   Problem: Physical Regulation: Goal: Complications related to the disease process, condition or treatment will be avoided or minimized Outcome: Progressing   Problem: Safety: Goal: Ability to remain free from injury will improve Outcome: Progressing   Problem: Activity: Goal: Risk for activity intolerance will decrease Outcome: Progressing   Problem: Coping: Goal: Level of anxiety will decrease Outcome: Progressing   Problem: Pain Managment: Goal: General experience of comfort will improve Outcome: Progressing   Problem: Safety: Goal: Ability to remain free from injury will improve Outcome: Progressing

## 2022-01-30 NOTE — Care Management Important Message (Signed)
Important Message  Patient Details  Name: ATHONY COPPA MRN: [id number] Date of Birth: [birth year]   Medicare Important Message Given:  Yes     Shelda Altes 01/30/2022, 8:47 AM

## 2022-01-30 NOTE — Progress Notes (Signed)
ANTICOAGULATION CONSULT NOTE - Follow Up Consult  Pharmacy Consult for Warfarin Indication: mechanical AVR  Allergies  Allergen Reactions   Augmentin [Amoxicillin-Pot Clavulanate] Anaphylaxis and Diarrhea    "Upset stomach"   Chicken Allergy Other (See Comments)   Pantoprazole Sodium Nausea Only    Gets gassy and starting itching like crazy Gets gassy and starting itching like crazy   Valium [Diazepam] Other (See Comments)    HA and Abd pain.   Tape Rash and Other (See Comments)    Surgical tape   Wound Dressing Adhesive Other (See Comments) and Rash    Surgical tape Surgical tape Surgical tape    Vital Signs: Temp: 97.8 F (36.6 C) (02/06 1141) Temp Source: Oral (02/06 1141) BP: 106/72 (02/06 1141) Pulse Rate: 63 (02/06 1141)  Labs: Recent Labs    01/28/22 0351 01/29/22 0328 01/30/22 0155  HGB 11.5* 11.8* 12.1*  HCT 35.4* 35.3* 38.4*  PLT 139* 153 196  LABPROT 27.2* 34.6* 34.2*  INR 2.5* 3.4* 3.4*  CREATININE 1.99* 1.98* 1.47*     Estimated Creatinine Clearance: 33.4 mL/min (A) (by C-G formula based on SCr of 1.47 mg/dL (H)).   Medications:  Scheduled:   (feeding supplement) PROSource Plus  30 mL Oral BID BM   aspirin EC  81 mg Oral q AM   docusate sodium  200 mg Oral BID   famotidine  10 mg Oral Daily   ferrous sulfate  325 mg Oral QODAY   folic acid  1 mg Oral Daily   insulin aspart  0-9 Units Subcutaneous TID WC   mouth rinse  15 mL Mouth Rinse BID   melatonin  6 mg Oral QHS   midodrine  15 mg Oral TID WC   multivitamin with minerals  1 tablet Oral Daily   pantoprazole  40 mg Oral Daily   polyethylene glycol  17 g Oral Daily   potassium chloride  40 mEq Oral Daily   rosuvastatin  5 mg Oral Daily   Selexipag  200 mcg Oral BID   sodium chloride flush  3 mL Intravenous Q12H   spironolactone  25 mg Oral Daily   thiamine  100 mg Oral Daily   Or   thiamine  100 mg Intravenous Daily   tiZANidine  2 mg Oral TID   torsemide  40 mg Oral BID    traZODone  50 mg Oral QHS   Warfarin - Pharmacist Dosing Inpatient   Does not apply q1600    Assessment: 76 y.o. male with medical history significant of chronic systolic CHF NYHA class III, HTN, CAD and aortic stenosis status post CABG and mechanical AVR on Coumadin, orthostatic hypotension, COPD and pulmonary hypertension, IIDM, PAF, liver cirrhosis secondary to alcohol abuse, presented with increasing leg swelling.  Home regimen per patient: warfarin 55m MonWed and warfarin 2.571mall other days.   INR remains elevated at 3.4 today. Hgb 12.1, plt 196 - no s/sx of bleeding. Oral intake variable (10-100% documented).   Goal of Therapy:  INR goal 2.5-3.0 (per clinic note) Monitor platelets by anticoagulation protocol: Yes   Plan:  Hold warfarin tonight to allow INR to trend down  Daily INR, s/s bleeding  Thank you for allowing pharmacy to participate in this patient's care.  KiAntonietta JewelPharmD, BCCCP Clinical Pharmacist  Phone: [phone]/05/2022 1:47 PM  Please check AMION for all [facility] numbers After 10:00 PM, call [phone]

## 2022-01-30 NOTE — Progress Notes (Signed)
Occupational Therapy Treatment Patient Details Name: Lance Hicks MRN: [id number] DOB: 03/27/1946 Today's Date: 01/30/2022   History of present illness 76 yo male presenting to ED on 1/30 with increased LE edema and shortness of breath. Patient with recent admit to [facility] with  pneumonia and accidental ingestion of feul injection fluid, but leaving AMA. PHM includes: a fib, a flutter, chronic anticoagulation, chronic back pain, COPD, CAD, dyspnea, HTN, aortic valve replacement 2018, CABG 2018, HLD, kidney stones, leukocytoclastic vasculitis, pleural effusion, syncope, wrist and forearm fx surgery, and alcohol abuse.   OT comments  Patient continues to make steady progress towards goals in skilled OT session. Patient's session encompassed bed level grooming tasks working on sitting balance and activity tolerance. Patient engaging minimally in session to date, advocating he wanted to walk with PT later, agreeable to pull to grooming ADLs. Patient able to maintain balance for 5-7 minutes. Continued to educate patient on the importance of consistent mobility, with patient in agreement. Therapy will continue to follow.    Recommendations for follow up therapy are one component of a multi-disciplinary discharge planning process, led by the attending physician.  Recommendations may be updated based on patient status, additional functional criteria and insurance authorization.    Follow Up Recommendations  Home health OT    Assistance Recommended at Discharge Intermittent Supervision/Assistance  Patient can return home with the following  A little help with walking and/or transfers;A little help with bathing/dressing/bathroom;Assistance with cooking/housework;Assist for transportation   Equipment Recommendations  BSC/3in1    Recommendations for Other Services      Precautions / Restrictions Precautions Precautions: Fall Precaution Comments: monitor O2 sats Restrictions Weight  Bearing Restrictions: No       Mobility Bed Mobility Overal bed mobility: Needs Assistance             General bed mobility comments: Pull to sit in bed using bilateral hand rails and maintain sitting balance for 5-7 minutes    Transfers                   General transfer comment: politely declined     Balance                                           ADL either performed or assessed with clinical judgement   ADL Overall ADL's : Needs assistance/impaired     Grooming: Set up Grooming Details (indicate cue type and reason): applying lotion to front                             Functional mobility during ADLs: Minimal assistance;Cueing for safety;Cueing for sequencing General ADL Comments: Patient engaging minimally in session to date, advocating he wanted to walk with PT later, agreeable to pull to sit at bedlevel and complete grooming tasks.    Extremity/Trunk Assessment              Vision       Perception     Praxis      Cognition Arousal/Alertness: Awake/alert Behavior During Therapy: WFL for tasks assessed/performed Overall Cognitive Status: Within Functional Limits for tasks assessed                                 General Comments: improved  cognition to date, however less participatory        Exercises      Shoulder Instructions       General Comments      Pertinent Vitals/ Pain       Pain Assessment Pain Assessment: No/denies pain  Home Living                                          Prior Functioning/Environment              Frequency  Min 2X/week        Progress Toward Goals  OT Goals(current goals can now be found in the care plan section)  Progress towards OT goals: Progressing toward goals  Acute Rehab OT Goals Patient Stated Goal: to get stronger and walk with PT later OT Goal Formulation: With patient Time For Goal Achievement:  02/08/22 Potential to Achieve Goals: [place] Discharge plan remains appropriate    Co-evaluation                 AM-PAC OT "6 Clicks" Daily Activity     Outcome Measure   Help from another person eating meals?: None Help from another person taking care of personal grooming?: A Little Help from another person toileting, which includes using toliet, bedpan, or urinal?: A Little Help from another person bathing (including washing, rinsing, drying)?: A Little Help from another person to put on and taking off regular upper body clothing?: A Little Help from another person to put on and taking off regular lower body clothing?: A Little 6 Click Score: 19    End of Session    OT Visit Diagnosis: Unsteadiness on feet (R26.81);Other abnormalities of gait and mobility (R26.89);Muscle weakness (generalized) (M62.81)   Activity Tolerance Patient limited by lethargy   Patient Left in bed;with call bell/phone within reach;with bed alarm set;with family/visitor present   Nurse Communication Mobility status        Time: 3437-3578 OT Time Calculation (min): 10 min  Charges: OT General Charges $OT Visit: 1 Visit OT Treatments $Self Care/Home Management : 8-22 mins  Corinne Ports E. Gurveer Colucci, OTR/L Acute Rehabilitation Services [phone] [place] 01/30/2022, 12:47 PM

## 2022-01-30 NOTE — Progress Notes (Addendum)
Advanced Heart Failure Rounding Note  PCP-Cardiologist: None   Subjective:   1/31 Started on milrinone 0.25 mcg + lasix 20 mg+ metolazone.  2/2 Milrinone stopped due to hypotension.   Remains off milrinone.   Weight down another 3 pounds on po torsemide (13 pounds total)  SCr trending down, 1.98>>1.47 today   Na trending up, 123>>126>>128   Feels better today. Breathing improved but still feels weak. Remains anxious. Wants to make sure he can ambulate ok before going home.   Objective:   Weight Range: 53.5 kg Body mass index is 17.42 kg/m.   Vital Signs:   Temp:  [97.4 F (36.3 C)-97.9 F (36.6 C)] 97.5 F (36.4 C) (02/06 0322) Pulse Rate:  [51-83] 83 (02/06 0322) Resp:  [17] 17 (02/06 0322) BP: (98-107)/(60-70) 98/70 (02/06 0322) SpO2:  [95 %-97 %] 95 % (02/06 0322) Weight:  [53.5 kg] 53.5 kg (02/06 0322) Last BM Date: 01/26/22  Weight change: Filed Weights   01/28/22 0341 01/29/22 0352 01/30/22 0322  Weight: 56.1 kg 54.7 kg 53.5 kg    Intake/Output:   Intake/Output Summary (Last 24 hours) at 01/30/2022 0740 Last data filed at 01/30/2022 0557 Gross per 24 hour  Intake 1315 ml  Output 3350 ml  Net -2035 ml      Physical Exam   General:  Thin elderly WM, terminally ill appearing. No resp difficulty HEENT: normal Neck: supple. JVD 8 cm Carotids 2+ bilat; no bruits. No lymphadenopathy or thryomegaly appreciated. Cor: PMI nondisplaced. Irregular rate & rhythm. Crisp mechanical valve sounds  Lungs: CTAB. No wheezing  Abdomen: soft, nontender, nondistended. No hepatosplenomegaly. No bruits or masses. Good bowel sounds. Extremities: thin extremities, no rash, tr b/l edema Neuro: alert & orientedx3, cranial nerves grossly intact. moves all 4 extremities w/o difficulty. Affect pleasant   Telemetry   Afib 60s w/ PVCs Personally reviewed   Labs    CBC Recent Labs    01/29/22 0328 01/30/22 0155  WBC 6.1 6.1  HGB 11.8* 12.1*  HCT 35.3* 38.4*  MCV  78.4* 80.2  PLT 153 416   Basic Metabolic Panel Recent Labs    01/29/22 0328 01/30/22 0155  NA 126* 128*  K 3.7 3.8  CL 88* 87*  CO2 27 30  GLUCOSE 93 92  BUN 35* 29*  CREATININE 1.98* 1.47*  CALCIUM 9.3 9.2  MG 2.0 2.0   Liver Function Tests No results for input(s): AST, ALT, ALKPHOS, BILITOT, PROT, ALBUMIN in the last 72 hours. No results for input(s): LIPASE, AMYLASE in the last 72 hours. Cardiac Enzymes No results for input(s): CKTOTAL, CKMB, CKMBINDEX, TROPONINI in the last 72 hours.  BNP: BNP (last 3 results) Recent Labs    09/30/21 0241 01/12/22 1524 01/23/22 1145  BNP 840.7* 1,341.7* 1,831.8*    ProBNP (last 3 results) Recent Labs    07/04/21 1607 08/16/21 1620  PROBNP 425.0* 601.0*     D-Dimer No results for input(s): DDIMER in the last 72 hours. Hemoglobin A1C No results for input(s): HGBA1C in the last 72 hours.  Fasting Lipid Panel No results for input(s): CHOL, HDL, LDLCALC, TRIG, CHOLHDL, LDLDIRECT in the last 72 hours. Thyroid Function Tests No results for input(s): TSH, T4TOTAL, T3FREE, THYROIDAB in the last 72 hours.  Invalid input(s): FREET3  Other results:   Imaging    No results found.   Medications:     Scheduled Medications:  (feeding supplement) PROSource Plus  30 mL Oral BID BM   aspirin EC  81  mg Oral q AM   docusate sodium  200 mg Oral BID   famotidine  10 mg Oral Daily   ferrous sulfate  325 mg Oral QODAY   folic acid  1 mg Oral Daily   insulin aspart  0-9 Units Subcutaneous TID WC   mouth rinse  15 mL Mouth Rinse BID   melatonin  6 mg Oral QHS   midodrine  15 mg Oral TID WC   multivitamin with minerals  1 tablet Oral Daily   pantoprazole  40 mg Oral Daily   polyethylene glycol  17 g Oral Daily   potassium chloride  40 mEq Oral Daily   rosuvastatin  5 mg Oral Daily   Selexipag  200 mcg Oral BID   sodium chloride flush  3 mL Intravenous Q12H   spironolactone  25 mg Oral Daily   thiamine  100 mg Oral Daily    Or   thiamine  100 mg Intravenous Daily   tiZANidine  2 mg Oral TID   torsemide  40 mg Oral BID   traZODone  50 mg Oral QHS   Warfarin - Pharmacist Dosing Inpatient   Does not apply q1600    Infusions:  sodium chloride      PRN Medications: sodium chloride, acetaminophen, guaiFENesin-dextromethorphan, LORazepam, methocarbamol, ondansetron (ZOFRAN) IV, ondansetron, polyethylene glycol, polyvinyl alcohol, simethicone, sodium chloride flush    Patient Profile   76 y/o old male with end-stage PAH/RV failure with chronic hypotension and hyponatremia. Also has CKD 3b, CAD s/p CABG/AVR.   Over past year multiple admits for volume overload and hyponatremia.    Recent had accidental ingestion of fuel injector cleaner. Also hospitalized at [facility].    Now presents with hypotension and volume overload   Assessment/Plan   1. A/C Biventricular Heart Failure, Predominant RV Failure   - Echo 10/22 EF 45-50% RV low normal  - Marked volume overload. BNP 1831. CXR with pulmonary edema.  - Brisk diuresis with milrinone 0.25 cmg + lasix drip + metolazone.  - Off milrinone due to hypotension - Volume status much improved, wt down 13  lb.  - Back on home dose torsemide. Also received tolvaptan on 2/4 - Renal function improving, 1.9>1.5 today (Baseline 1.7-2.0)   2. PAH  -WHO Group I & II - Auto-immune serologies negative (checked twice). - ? Component of HHT/shunt/AVMs with late bubbles on bubble study.  - Echo with bubble study (05/16/18): EF 55-60% with "late bubbles" , Mechanical AVR stable with trivial perivalvular regurg, Mild MR, Severe LAE, Severe RV dilation and reduced function. No PFO. Mod TR, PA peak pressure 68 mm Hg - Echo (4/22): EF 50-55%, RV ok, severe RVSP 61.6 mmHg, severe biatrial enlargement, moderate TR, stable AVR, AV mean gradient 11 mmHg - Echo 10/22 EF 45-50 RV low normal  - Ab u/s with cirrhosis but no evidence of portal HTN. - Off tadalafil and  Macitentan w/ hypotension and fluid retention -Continue Uptravi.    3. Chronic A fib  - No longer on bb due to bradycardia.  - INR 3.4 - Coumadin per pharmacy. Discussed dosing with PharmD personally.   4. Hypotension  - in setting of RV failure - continue midodrine 15 tid   5. Hyponatremia  - in setting of end-stage HF  - Sodium 130-->126->125->122 ->123 -> tolvaptan -> 126->128  - Restrict fluids    6. PVCs,   7. CAD  - s/p CABG 07/2017 with Dr Roderic Palau in [place] - No s/s angina  8. Leukocytoclastic Vasculitis - Followed by Rheumatology    9. Cirrhosis US Abdomen RUQ 05/16/18 - s/p cholecystectomy. + hepatic cirrhosis. Mild ascites.  - Liver Doppler 05/16/18 - No hepatic, splenic, or portal venous thrombosis or occlusion. Mild ascites   9. S/P Mechanical AVR - Coumadin held on admit w/ supratherapeutic INR (4.1) - INR 3.4  today  - Continue coumadin, dosing per pharmacy   10. Hypokalemia/Hypomagnesemia - stable. K 3.8, Mg 2.0 today   11 GOC:  - End-stage RV failure  - Palliative care following - DNR/DNI  - I spoke with him and his daughter. I think the best option for him is Residential Hospice but he is refusing (has multiple animals at home including 140 pound Rottweiler). Has agreed to [facility]. I am not sure this will be sufficient support but he is trying to get an aide at home to help him. Will d/w SW and CM to determine most appropriate dispo  Out of bed and ambulate w/ PT/OT today. Anxiety management per IM    Length of Stay: [address], PA-C  01/30/2022, 7:40 AM  Advanced Heart Failure Team Pager [phone] (M-F; 7a - 5p)  Please contact [facility] for night-coverage after hours (5p -7a ) and weekends on [email or web address]  Patient seen and examined with the above-signed Advanced Practice Provider and/or Housestaff. I personally reviewed laboratory data, imaging studies and relevant notes. I independently examined the patient and formulated the  important aspects of the plan. I have edited the note to reflect any of my changes or salient points. I have personally discussed the plan with the patient and/or family.  Sitting in chair. Looks stronger today. Able to walk down hall with RW and PT. Diuresing well on po torsemide. Renal function ok. Refuses SNF or residential Hospice.   General:  Sitting in chair. Weak appearing. No resp difficulty HEENT: normal Neck: supple. no JVD. Carotids 2+ bilat; no bruits. No lymphadenopathy or thryomegaly appreciated. Cor: PMI nondisplaced. Irregular rate & rhythm. Mechanical s2 Lungs: clear Abdomen: soft, nontender, nondistended. No hepatosplenomegaly. No bruits or masses. Good bowel sounds. Extremities: no cyanosis, clubbing, rash, edema Neuro: alert & orientedx3, cranial nerves grossly intact. moves all 4 extremities w/o difficulty. Affect pleasant  He looks a bit better today. Long talk with him with HH/Hospice nurse in room. He is adamant that he wants to go home and will not go anywhere else. He understands he is at high risk for decompensation.   Will continue to adjust meds. Suspect he may be ready for d/c by Wednesday.   Total time spent 35 minutes. Over half that time spent discussing above.    Glori Bickers, MD  3:53 PM

## 2022-01-30 NOTE — Progress Notes (Signed)
Progress Note   Patient: Lance Hicks KDX:[id number] DOB: Mar 04, 1946 DOA: 01/23/2022     7 DOS: the patient was seen and examined on 01/30/2022   Brief hospital course: Lance Hicks is a 76 year old male with past medical history significant for chronic systolic congestive heart failure NYHA class III, essential pretension, CAD s/p CABG, aortic stenosis s/p aortic valve replacement on Coumadin, orthostatic hypotension, COPD, pulmonary hypertension, type 2 diabetes mellitus, paroxysmal atrial fibrillation, cirrhosis secondary to EtOH abuse who presented to [facility] ED on 1/30 with progressive lower extremity edema.  Recently hospitalized at [facility] for pneumonia; treated with antibiotics in which his Lasix was held for 3 days for "low blood pressure".  Discharged home and patient started noticed increased swelling of his lower legs and he resumed his Lasix for the past 3 days however with no improvement.  Patient also reporting exertional dyspnea and was noted by his friend to be "very winded" and sent him to the hospital for further evaluation.  Denies cough, no fever/chills.  In the ED, BP was slightly hypotensive, found to be hypoxic and placed on 3 L nasal cannula.  Chest x-ray with bilateral pulmonary congestion.  Cardiology consulted and patient was given 80 mg IV Lasix.  Hospital service consulted for further evaluation management.   Assessment and Plan: Acute on chronic combined systolic and diastolic CHF (congestive heart failure) ([name])- (present on admission) Patient presenting to ED with progressive shortness of breath in the setting of his home Lasix.  Patient was found to be hypoxic requiring submental oxygen to maintain adequate SPO2.  BNP elevated 1831.  Chest x-ray with pulmonary edema, bilateral effusions greater on right. --Cardiology following, appreciate assistance --net negative 2.5L past 24h and net negative 13L since admission --Wt  59.4>57.2>55.5>55>55.>54.75>53.5kg --toresemide 54m PO BID (last night dose held for hypotension) --Milrinone drip d/c'd for hypotension --Midodrine 15 mg p.o. TID --Spironolactone 25 mg p.o. daily --torsemide 466mPO BID --Supplemental oxygen remains off --Strict I's and O's and daily weights --Monitor renal function closely daily --Overall poor prognosis, seen by palliative care on 2/1 for assistance with goals of care and medical decision making, now DNR but plan to continue aggressive care with plan to return home with PT/OT  Chronic atrial fibrillation ([place] (present on admission) Rate controlled, not on beta-blocker/CCB outpatient. --Continue Coumadin, pharmacy consulted for dosing/monitoring (INR goal 2.5-3.0) --INR 3.4 today --continue to monitor  [name] --Continue Coumadin, pharmacy consulted for dosing/monitoring --Goal INR 2.5-3.0; 3.4 today --Continue monitor INR daily  Hypotension, chronic- (present on admission) --Continue midodrine 15 mg p.o. TID  Hyperlipidemia- (present on admission) --Crestor 5 mg p.o. daily  GERD (gastroesophageal reflux disease)- (present on admission) --Protonix 40 mg p.o. daily  Alcohol abuse- (present on admission) Counseled on need for cessation. --CIWA protocol with symptom triggered Ativan --Thiamine, multivitamin, folic acid  Iron deficiency anemia- (present on admission) Anemia panel with iron 164, TIBC 360, ferritin 38, folate 29.1, B12 2080. --Ferrous sulfate 325 mg p.o. every other day  Hyponatremia- (present on admission) Sodium 131 on admission, etiology likely secondary to hypervolemic hyponatremia in the setting of volume overload/CHF exacerbation as above. --Na 131>>>125>122>123>126>128 --Toresemide and spironolactone as above --BMP daily  Pulmonary arterial hypertension ([place]CM[place] (present on admission) Home regimen includes tadalafil 40 mg p.o. every morning. --Selexipag 20043mPO  BID  Hypokalemia, Hypomagnesmia Potassium 3.8, magnesium 2.0 this morning --goal K >4.0 and Mag >2.0 --Continue to follow electrolytes daily  Physical deconditioning- (present on admission) Patient with  significant weakness, debility, gait disturbance and deconditioning from recurrent hospitalization.  PT/OT currently recommends SNF placement, patient declines and states has many friends including the [employer] community will assist him when he returns home. --Continue PT/OT efforts while inpatient, anticipate home with home health when ready for discharge  Protein-calorie malnutrition, severe ([place])- (present on admission) Body mass index is 18.26 kg/m.  Nutrition Status: Nutrition Problem: Increased nutrient needs Etiology: chronic illness (CHF) Signs/Symptoms: estimated needs Interventions: MVI, Prostat, Liberalize Diet, Refer to RD note for recommendations -- Dietitian following, continue to encourage increased oral/protein intake, supplementation        Subjective:  Patient seen and examined at bedside, resting comfortably.  No family present this morning.  Continues with good urine output on p.o. torsemide.  Eating breakfast.  States "I am going to stay in the hospital until I am able to walk the halls".  No other specific complaints or concerns at this time.  Denies headache, no dizziness, no chest pain, no palpitations, no shortness of breath, no abdominal pain, no fever/chills/night sweats, no nausea/vomiting/diarrhea, no fatigue, no paresthesias.  No acute events overnight per nursing staff.  Physical Exam: Vitals:   01/29/22 1200 01/29/22 2021 01/30/22 0322 01/30/22 0700  BP: 102/62 107/67 98/70 (!) 101/59  Pulse:  83 83   Resp:  _0 Temp:  (!) 97.4 F (36.3 C) (!) 97.5 F (36.4 C)   TempSrc:  Oral Axillary   SpO2:  95% 95%   Weight:   53.5 kg   Height:       Physical Exam GEN: 76 yo male in NAD, alert and oriented x 3, chronically ill/cachectic, thin in  appearance, appears older than stated age HEENT: NCAT, PERRL, EOMI, sclera clear, MMM PULM: CTAB w/o wheezes/crackles, normal respiratory effort, on room air CV: RRR w/o M/G/R GI: abd soft, NTND, NABS, no R/G/M MSK: no peripheral edema, muscle strength globally intact 5/5 bilateral upper/lower extremities NEURO: CN II-XII intact, no focal deficits, sensation to light touch intact PSYCH: normal mood/affect Integumentary: Chronic venous changes with discoloration in bilateral lower extremities, otherwise no concerning rashes/lesions/wounds.   Data Reviewed:  BMP, magnesium level personally reviewed this morning  Family Communication: No family present at bedside this morning  Disposition: Status is: Inpatient  Remains inpatient appropriate because: Needs further rehabilitation with plan for discharge home with home health, awaiting cardiology sign off.  Patient declines SNF placement on several occasions.    Planned Discharge Destination: Home with Home Health     Time spent: 39 minutes spent on chart review, discussion with nursing staff, consultants, updating family and interview/physical exam; more than 50% of that time was spent in counseling and/or coordination of care.  Author: Donnamarie Poag [place] DO 01/30/2022 9:46 AM  For on call review [email or web address].

## 2022-01-30 NOTE — Progress Notes (Signed)
Physical Therapy Treatment Patient Details Name: Lance Hicks MRN: [id number] DOB: 11/07/1946 Today's Date: 01/30/2022   History of Present Illness 76 yo male presenting to ED on 1/30 with increased LE edema and shortness of breath. Patient with recent admit to [facility] with  pneumonia and accidental ingestion of feul injection fluid, but leaving AMA. PHM includes: a fib, a flutter, chronic anticoagulation, chronic back pain, COPD, CAD, dyspnea, HTN, aortic valve replacement 2018, CABG 2018, HLD, kidney stones, leukocytoclastic vasculitis, pleural effusion, syncope, wrist and forearm fx surgery, and alcohol abuse.    PT Comments    The pt was agreeable to session this afternoon as SBP >110 upon my arrival. He was soiled of BM in the bed and unaware, after being cleaned he was able to demo good progress with hallway ambulation and tolerated higher-intensity bouts (10-3f of "fast" walking x6) well with good change in pace. He continues to need max cues for position in the RW and minA to steady during turning as he frequently ends up with his feet outside of RW support resulting in LOB requiring minA to recover. Will continue to progress as tolerated to facilitate maximal recovery and independence.   Gait Speed: 0.458m using RW and with minG. (Gait speed <0.56m73mindicates increased risk of falls and dependence in ADLs) Able to increase speed to 0.75m93mhen asked to walk "fast".    Recommendations for follow up therapy are one component of a multi-disciplinary discharge planning process, led by the attending physician.  Recommendations may be updated based on patient status, additional functional criteria and insurance authorization.  Follow Up Recommendations  Skilled nursing-short term rehab (<3 hours/day)     Assistance Recommended at Discharge Frequent or constant Supervision/Assistance  Patient can return home with the following A little help with bathing/dressing/bathroom;A  little help with walking and/or transfers;Assistance with cooking/housework;Assist for transportation;Help with stairs or ramp for entrance   Equipment Recommendations  None recommended by PT    Recommendations for Other Services       Precautions / Restrictions Precautions Precautions: Fall Precaution Comments: monitor O2 sats Restrictions Weight Bearing Restrictions: No     Mobility  Bed Mobility Overal bed mobility: Needs Assistance Bed Mobility: Supine to Sit, Sit to Supine     Supine to sit: Min guard     General bed mobility comments: pt completed with increased time and use of bed rail    Transfers Overall transfer level: Needs assistance Equipment used: Rolling walker (2 wheels) Transfers: Sit to/from Stand Sit to Stand: Min guard           General transfer comment: max cues for hands placed on bed rather than pulling on RW    Ambulation/Gait Ambulation/Gait assistance: Min guard, Min assist Gait Distance (Feet): 200 Feet Assistive device: Rolling walker (2 wheels) Gait Pattern/deviations: Step-through pattern, Decreased stride length Gait velocity: 0.45 m/s self-slected gait speed, 0.91 m/s when cued for "fast" walking Gait velocity interpretation: 1.31 - 2.62 ft/sec, indicative of limited community ambulator   General Gait Details: pt with small steps and needing frequent cues for posture and positioning in RW, minA to steady with turning as pt with posterior LOB. challenged by speed intervals x6      Balance Overall balance assessment: Needs assistance Sitting-balance support: Feet supported Sitting balance-Leahy Scale: Good     Standing balance support: No upper extremity supported, Bilateral upper extremity supported, During functional activity Standing balance-Leahy Scale: Fair Standing balance comment: static stance without UE support, posterior LOB  with turning during gait                            Cognition  Arousal/Alertness: Awake/alert Behavior During Therapy: WFL for tasks assessed/performed Overall Cognitive Status: Impaired/Different from baseline Area of Impairment: Awareness                           Awareness: Intellectual   General Comments: pt incontinent of BM upon my arrival and unaware. needs repeated cues for safety        Exercises      General Comments General comments (skin integrity, edema, etc.): SpO2 low of 88% on RA after gait. BP 120/57 prior to ambulation      Pertinent Vitals/Pain Pain Assessment Pain Assessment: No/denies pain     PT Goals (current goals can now be found in the care plan section) Acute Rehab PT Goals Patient Stated Goal: to walk and go home again, NO REHAB PT Goal Formulation: With patient Time For Goal Achievement: 02/07/22 Potential to Achieve Goals: Fair Progress towards PT goals: Progressing toward goals    Frequency    Min 3X/week      PT Plan Current plan remains appropriate       AM-PAC PT "6 Clicks" Mobility   Outcome Measure  Help needed turning from your back to your side while in a flat bed without using bedrails?: A Little Help needed moving from lying on your back to sitting on the side of a flat bed without using bedrails?: A Little Help needed moving to and from a bed to a chair (including a wheelchair)?: A Little Help needed standing up from a chair using your arms (e.g., wheelchair or bedside chair)?: A Little Help needed to walk in hospital room?: A Little Help needed climbing 3-5 steps with a railing? : A Lot 6 Click Score: 17    End of Session Equipment Utilized During Treatment: Gait belt Activity Tolerance: Patient tolerated treatment well Patient left: in chair;with call bell/phone within reach Nurse Communication: Mobility status PT Visit Diagnosis: Unsteadiness on feet (R26.81);Muscle weakness (generalized) (M62.81);Difficulty in walking, not elsewhere classified (R26.2);Adult,  failure to thrive (R62.7)     Time: 0071-2197 PT Time Calculation (min) (ACUTE ONLY): 32 min  Charges:  $Therapeutic Exercise: 23-37 mins                     [name], PT, DPT   Acute Rehabilitation Department Pager #: [phone]   [name] 01/30/2022, 1:43 PM

## 2022-01-30 NOTE — Plan of Care (Signed)
Problem: Education: Goal: Ability to demonstrate management of disease process will improve 01/30/2022 0102 by Mollie Germany, RN Outcome: Progressing 01/30/2022 0102 by Mollie Germany, RN Outcome: Progressing Goal: Ability to verbalize understanding of medication therapies will improve 01/30/2022 0102 by Mollie Germany, RN Outcome: Progressing 01/30/2022 0102 by Mollie Germany, RN Outcome: Progressing

## 2022-01-31 ENCOUNTER — Other Ambulatory Visit (HOSPITAL_COMMUNITY): Payer: Self-pay

## 2022-01-31 ENCOUNTER — Inpatient Hospital Stay (HOSPITAL_COMMUNITY): Payer: Medicare Other

## 2022-01-31 ENCOUNTER — Telehealth (HOSPITAL_COMMUNITY): Payer: Self-pay | Admitting: Pharmacy Technician

## 2022-01-31 DIAGNOSIS — E871 Hypo-osmolality and hyponatremia: Secondary | ICD-10-CM | POA: Diagnosis not present

## 2022-01-31 DIAGNOSIS — R109 Unspecified abdominal pain: Secondary | ICD-10-CM | POA: Diagnosis present

## 2022-01-31 DIAGNOSIS — I9589 Other hypotension: Secondary | ICD-10-CM | POA: Diagnosis not present

## 2022-01-31 DIAGNOSIS — I5043 Acute on chronic combined systolic (congestive) and diastolic (congestive) heart failure: Secondary | ICD-10-CM | POA: Diagnosis not present

## 2022-01-31 DIAGNOSIS — Z952 Presence of prosthetic heart valve: Secondary | ICD-10-CM | POA: Diagnosis not present

## 2022-01-31 DIAGNOSIS — I482 Chronic atrial fibrillation, unspecified: Secondary | ICD-10-CM | POA: Diagnosis not present

## 2022-01-31 DIAGNOSIS — R1012 Left upper quadrant pain: Secondary | ICD-10-CM

## 2022-01-31 LAB — BASIC METABOLIC PANEL
Anion gap: 10 (ref 5–15)
BUN: 29 mg/dL — ABNORMAL HIGH (ref 8–23)
CO2: 29 mmol/L (ref 22–32)
Calcium: 9.2 mg/dL (ref 8.9–10.3)
Chloride: 86 mmol/L — ABNORMAL LOW (ref 98–111)
Creatinine, Ser: 1.45 mg/dL — ABNORMAL HIGH (ref 0.61–1.24)
GFR, Estimated: 51 mL/min — ABNORMAL LOW (ref >60)
Glucose, Bld: 91 mg/dL (ref 70–99)
Potassium: 4 mmol/L (ref 3.5–5.1)
Sodium: 125 mmol/L — ABNORMAL LOW (ref 135–145)

## 2022-01-31 LAB — CBC
HCT: 35.7 % — ABNORMAL LOW (ref 39.0–52.0)
Hemoglobin: 11.8 g/dL — ABNORMAL LOW (ref 13.0–17.0)
MCH: 26.1 pg (ref 26.0–34.0)
MCHC: 33.1 g/dL (ref 30.0–36.0)
MCV: 79 fL — ABNORMAL LOW (ref 80.0–100.0)
Platelets: 206 10*3/uL (ref 150–400)
RBC: 4.52 MIL/uL (ref 4.22–5.81)
RDW: 21.3 % — ABNORMAL HIGH (ref 11.5–15.5)
WBC: 5.2 10*3/uL (ref 4.0–10.5)
nRBC: 0 % (ref 0.0–0.2)

## 2022-01-31 LAB — GLUCOSE, CAPILLARY
Glucose-Capillary: 88 mg/dL (ref 70–99)
Glucose-Capillary: 114 mg/dL — ABNORMAL HIGH (ref 70–99)
Glucose-Capillary: 106 mg/dL — ABNORMAL HIGH (ref 70–99)
Glucose-Capillary: 92 mg/dL (ref 70–99)

## 2022-01-31 LAB — URINALYSIS, ROUTINE W REFLEX MICROSCOPIC
Bilirubin Urine: NEGATIVE
Glucose, UA: NEGATIVE mg/dL
Hgb urine dipstick: NEGATIVE
Ketones, ur: NEGATIVE mg/dL
Leukocytes,Ua: NEGATIVE
Nitrite: NEGATIVE
Protein, ur: NEGATIVE mg/dL
Specific Gravity, Urine: 1.012 (ref 1.005–1.030)
pH: 6 (ref 5.0–8.0)

## 2022-01-31 LAB — PROTIME-INR
INR: 3.4 — ABNORMAL HIGH (ref 0.8–1.2)
Prothrombin Time: 34.4 seconds — ABNORMAL HIGH (ref 11.4–15.2)

## 2022-01-31 LAB — MAGNESIUM: Magnesium: 1.9 mg/dL (ref 1.7–2.4)

## 2022-01-31 MED ORDER — OXYCODONE HCL 5 MG PO TABS
5.0000 mg | ORAL_TABLET | Freq: Once | ORAL | Status: AC
Start: 2022-01-31 — End: 2022-01-31
  Administered 2022-01-31: 5 mg via ORAL
  Filled 2022-01-31: qty 1

## 2022-01-31 MED ORDER — POTASSIUM CHLORIDE CRYS ER 10 MEQ PO TBCR
40.0000 meq | EXTENDED_RELEASE_TABLET | Freq: Every day | ORAL | 2 refills | Status: DC
  Filled 2022-01-31: qty 120, 30d supply, fill #0

## 2022-01-31 MED ORDER — ASPIRIN 81 MG PO TBEC
81.0000 mg | DELAYED_RELEASE_TABLET | Freq: Every morning | ORAL | 2 refills | Status: AC
  Filled 2022-01-31: qty 30, 30d supply, fill #0

## 2022-01-31 MED ORDER — FERROUS SULFATE 325 (65 FE) MG PO TABS
325.0000 mg | ORAL_TABLET | ORAL | 2 refills | Status: DC
Start: 2022-01-31 — End: 2022-05-30
  Filled 2022-01-31: qty 15, 30d supply, fill #0

## 2022-01-31 MED ORDER — OXYCODONE HCL 5 MG PO TABS
5.0000 mg | ORAL_TABLET | Freq: Once | ORAL | Status: AC
  Administered 2022-01-31: 5 mg via ORAL
  Filled 2022-01-31: qty 1

## 2022-01-31 MED ORDER — WARFARIN SODIUM 5 MG PO TABS
2.5000 mg | ORAL_TABLET | ORAL | 2 refills | Status: DC
  Filled 2022-01-31: qty 20, 30d supply, fill #0
  Filled 2022-01-31: qty 20, 24d supply, fill #0

## 2022-01-31 MED ORDER — MIDODRINE HCL 5 MG PO TABS
15.0000 mg | ORAL_TABLET | Freq: Three times a day (TID) | ORAL | 2 refills | Status: AC
  Filled 2022-01-31: qty 180, 20d supply, fill #0

## 2022-01-31 MED ORDER — TRAMADOL HCL 50 MG PO TABS
50.0000 mg | ORAL_TABLET | Freq: Three times a day (TID) | ORAL | Status: DC | PRN
  Administered 2022-01-31 – 2022-02-01 (×3): 50 mg via ORAL
  Filled 2022-01-31 (×3): qty 1

## 2022-01-31 MED ORDER — ROSUVASTATIN CALCIUM 5 MG PO TABS
5.0000 mg | ORAL_TABLET | Freq: Every day | ORAL | 2 refills | Status: DC
Start: 2022-01-31 — End: 2022-05-30
  Filled 2022-01-31: qty 30, 30d supply, fill #0

## 2022-01-31 MED ORDER — TORSEMIDE 20 MG PO TABS
40.0000 mg | ORAL_TABLET | Freq: Two times a day (BID) | ORAL | 2 refills | Status: DC
  Filled 2022-01-31: qty 120, 30d supply, fill #0

## 2022-01-31 MED ORDER — SPIRONOLACTONE 25 MG PO TABS
25.0000 mg | ORAL_TABLET | Freq: Every day | ORAL | 2 refills | Status: DC
Start: 2022-01-31 — End: 2022-02-04
  Filled 2022-01-31: qty 30, 30d supply, fill #0

## 2022-01-31 NOTE — Telephone Encounter (Signed)
Advanced Heart Failure Patient Advocate Encounter  Spoke with patient who was having a hard time getting [facility] to fill the Opsumit and Uptravi. Called and provided the grant information, it is being urgently triaged over to [facility]. Advised the patient to call me back if he does not hear from them soon.  Charlann Boxer, CPhT

## 2022-01-31 NOTE — Plan of Care (Signed)
Problem: Education: Goal: Ability to demonstrate management of disease process will improve Outcome: Adequate for Discharge Goal: Ability to verbalize understanding of medication therapies will improve Outcome: Adequate for Discharge Goal: Individualized Educational Video(s) Outcome: Adequate for Discharge   Problem: Activity: Goal: Capacity to carry out activities will improve Outcome: Adequate for Discharge   Problem: Cardiac: Goal: Ability to achieve and maintain adequate cardiopulmonary perfusion will improve Outcome: Adequate for Discharge   Problem: Metabolic: Goal: Ability to maintain appropriate glucose levels will improve Outcome: Adequate for Discharge   Problem: Education: Goal: Knowledge of disease or condition will improve Outcome: Adequate for Discharge   Problem: Health Behavior/Discharge Planning: Goal: Ability to identify changes in lifestyle to reduce recurrence of condition will improve Outcome: Adequate for Discharge   Problem: Physical Regulation: Goal: Complications related to the disease process, condition or treatment will be avoided or minimized Outcome: Adequate for Discharge   Problem: Safety: Goal: Ability to remain free from injury will improve Outcome: Adequate for Discharge   Problem: Education: Goal: Knowledge of General Education information will improve Description: Including pain rating scale, medication(s)/side effects and non-pharmacologic comfort measures Outcome: Adequate for Discharge   Problem: Health Behavior/Discharge Planning: Goal: Ability to manage health-related needs will improve Outcome: Adequate for Discharge   Problem: Clinical Measurements: Goal: Ability to maintain clinical measurements within normal limits will improve Outcome: Adequate for Discharge Goal: Will remain free from infection Outcome: Adequate for Discharge Goal: Diagnostic test results will improve Outcome: Adequate for Discharge Goal: Respiratory  complications will improve Outcome: Adequate for Discharge Goal: Cardiovascular complication will be avoided Outcome: Adequate for Discharge   Problem: Activity: Goal: Risk for activity intolerance will decrease Outcome: Adequate for Discharge   Problem: Nutrition: Goal: Adequate nutrition will be maintained Outcome: Adequate for Discharge   Problem: Coping: Goal: Level of anxiety will decrease Outcome: Adequate for Discharge   Problem: Elimination: Goal: Will not experience complications related to bowel motility Outcome: Adequate for Discharge Goal: Will not experience complications related to urinary retention Outcome: Adequate for Discharge   Problem: Pain Managment: Goal: General experience of comfort will improve Outcome: Adequate for Discharge   Problem: Safety: Goal: Ability to remain free from injury will improve Outcome: Adequate for Discharge   Problem: Skin Integrity: Goal: Risk for impaired skin integrity will decrease Outcome: Adequate for Discharge

## 2022-01-31 NOTE — Progress Notes (Signed)
Mobility Specialist Progress Note:   01/31/22 1400  Mobility  Range of Motion/Exercises Active;All extremities  Level of Assistance Independent  Assistive Device None  Activity Response Tolerated fair  $Mobility charge 1 Mobility   Pt declined OOB mobility d/t significant rib pain when voiding. Pt states pain has been making him tearful, and that he is anxious that he is being d/c today. Pt performed multiple bed exercises, limited d/t pain.   Nelta Numbers Mobility Specialist  Phone [phone]

## 2022-01-31 NOTE — Assessment & Plan Note (Addendum)
CT renal with renal stones but not obstructive uropathy  Pain better controlled with oral oxycodone, he did received IV morphine during his hospitalization.

## 2022-01-31 NOTE — Progress Notes (Addendum)
Advanced Heart Failure Rounding Note  PCP-Cardiologist: None   Subjective:   1/31 Started on milrinone 0.25 mcg + lasix 20 mg+ metolazone.  2/2 Milrinone stopped due to hypotension.   SCr trending down, 1.98>>1.47>>1.45  today   Na trending up, 123>>126>>128 >>125    Complaining of LUQ abdominal pain. Notices it more with inspiration.     Objective:   Weight Range: 53.7 kg Body mass index is 17.48 kg/m.   Vital Signs:   Temp:  [97.6 F (36.4 C)-97.8 F (36.6 C)] 97.8 F (36.6 C) (02/07 0407) Pulse Rate:  [45-63] 45 (02/06 1948) Resp:  [16-20] 20 (02/07 0407) BP: (92-113)/(44-72) 95/63 (02/07 0407) SpO2:  [94 %-98 %] 94 % (02/07 0407) Weight:  [53.7 kg] 53.7 kg (02/07 0412) Last BM Date: 01/30/22  Weight change: Filed Weights   01/29/22 0352 01/30/22 0322 01/31/22 0412  Weight: 54.7 kg 53.5 kg 53.7 kg    Intake/Output:   Intake/Output Summary (Last 24 hours) at 01/31/2022 0753 Last data filed at 01/31/2022 0423 Gross per 24 hour  Intake 825 ml  Output 975 ml  Net -150 ml      Physical Exam   General:  Thin.  No resp difficulty HEENT: normal Neck: supple. JVP 6-7 . Carotids 2+ bilat; no bruits. No lymphadenopathy or thryomegaly appreciated. Cor: PMI nondisplaced. Irregular rate & rhythm. No rubs, gallops or murmurs. Lungs: clear Abdomen: soft, nontender, nondistended. No hepatosplenomegaly. No bruits or masses. Good bowel sounds. Extremities: no cyanosis, clubbing, rash, edema Neuro: alert & orientedx3, cranial nerves grossly intact. moves all 4 extremities w/o difficulty. Affect pleasant  Telemetry  A fib 60s with occasional PVCs.    Labs    CBC Recent Labs    01/30/22 0155 01/31/22 0421  WBC 6.1 5.2  HGB 12.1* 11.8*  HCT 38.4* 35.7*  MCV 80.2 79.0*  PLT 196 229   Basic Metabolic Panel Recent Labs    01/30/22 0155 01/31/22 0421  NA 128* 125*  K 3.8 4.0  CL 87* 86*  CO2 30 29  GLUCOSE 92 91  BUN 29* 29*  CREATININE 1.47* 1.45*   CALCIUM 9.2 9.2  MG 2.0 1.9   Liver Function Tests No results for input(s): AST, ALT, ALKPHOS, BILITOT, PROT, ALBUMIN in the last 72 hours. No results for input(s): LIPASE, AMYLASE in the last 72 hours. Cardiac Enzymes No results for input(s): CKTOTAL, CKMB, CKMBINDEX, TROPONINI in the last 72 hours.  BNP: BNP (last 3 results) Recent Labs    09/30/21 0241 01/12/22 1524 01/23/22 1145  BNP 840.7* 1,341.7* 1,831.8*    ProBNP (last 3 results) Recent Labs    07/04/21 1607 08/16/21 1620  PROBNP 425.0* 601.0*     D-Dimer No results for input(s): DDIMER in the last 72 hours. Hemoglobin A1C No results for input(s): HGBA1C in the last 72 hours.  Fasting Lipid Panel No results for input(s): CHOL, HDL, LDLCALC, TRIG, CHOLHDL, LDLDIRECT in the last 72 hours. Thyroid Function Tests No results for input(s): TSH, T4TOTAL, T3FREE, THYROIDAB in the last 72 hours.  Invalid input(s): FREET3  Other results:   Imaging    No results found.   Medications:     Scheduled Medications:  (feeding supplement) PROSource Plus  30 mL Oral BID BM   aspirin EC  81 mg Oral q AM   docusate sodium  200 mg Oral BID   famotidine  10 mg Oral Daily   ferrous sulfate  325 mg Oral QODAY   folic acid  1 mg Oral Daily   insulin aspart  0-9 Units Subcutaneous TID WC   mouth rinse  15 mL Mouth Rinse BID   melatonin  6 mg Oral QHS   midodrine  15 mg Oral TID WC   multivitamin with minerals  1 tablet Oral Daily   pantoprazole  40 mg Oral Daily   polyethylene glycol  17 g Oral Daily   potassium chloride  40 mEq Oral Daily   rosuvastatin  5 mg Oral Daily   Selexipag  200 mcg Oral BID   sodium chloride flush  3 mL Intravenous Q12H   spironolactone  25 mg Oral Daily   thiamine  100 mg Oral Daily   Or   thiamine  100 mg Intravenous Daily   tiZANidine  2 mg Oral TID   torsemide  40 mg Oral BID   traZODone  50 mg Oral QHS   Warfarin - Pharmacist Dosing Inpatient   Does not apply q1600     Infusions:  sodium chloride      PRN Medications: sodium chloride, acetaminophen, guaiFENesin-dextromethorphan, LORazepam, methocarbamol, Muscle Rub, ondansetron (ZOFRAN) IV, ondansetron, polyethylene glycol, polyvinyl alcohol, simethicone, sodium chloride flush    Patient Profile   76 y/o old male with end-stage PAH/RV failure with chronic hypotension and hyponatremia. Also has CKD 3b, CAD s/p CABG/AVR.   Over past year multiple admits for volume overload and hyponatremia.    Recent had accidental ingestion of fuel injector cleaner. Also hospitalized at [facility].    Now presents with hypotension and volume overload   Assessment/Plan   1. A/C Biventricular Heart Failure, Predominant RV Failure   - Echo 10/22 EF 45-50% RV low normal  - Marked volume overload. BNP 1831. CXR with pulmonary edema.  - Brisk diuresis with milrinone 0.25 cmg + lasix drip + metolazone.  - Off milrinone due to hypotension - Volume status much improved, wt down 12 lb.  - Back on home dose torsemide. Also received tolvaptan on 2/4 - Renal function improving, 1.9>1.45 (Baseline 1.7-2.0)   2. PAH  -WHO Group I & II - Auto-immune serologies negative (checked twice). - ? Component of HHT/shunt/AVMs with late bubbles on bubble study.  - Echo with bubble study (05/16/18): EF 55-60% with "late bubbles" , Mechanical AVR stable with trivial perivalvular regurg, Mild MR, Severe LAE, Severe RV dilation and reduced function. No PFO. Mod TR, PA peak pressure 68 mm Hg - Echo (4/22): EF 50-55%, RV ok, severe RVSP 61.6 mmHg, severe biatrial enlargement, moderate TR, stable AVR, AV mean gradient 11 mmHg - Echo 10/22 EF 45-50 RV low normal  - Ab u/s with cirrhosis but no evidence of portal HTN. - Off tadalafil and Macitentan w/ hypotension and fluid retention -Continue Uptravi.>1.45   3. Chronic A fib  - No longer on bb due to bradycardia.  - INR 3.4 - Coumadin per pharmacy. Discussed dosing with PharmD  personally.   4. Hypotension  - in setting of RV failure - continue midodrine 15 tid   5. Hyponatremia  - in setting of end-stage HF  - Sodium 130-->126->125->122 ->123 -> tolvaptan -> 126->128 ->125 - Restrict fluids    6. PVCs  7. CAD  - s/p CABG 07/2017 with Dr Roderic Palau in [place] - No s/s angina   8. Leukocytoclastic Vasculitis - Followed by Rheumatology    9. Cirrhosis US Abdomen RUQ 05/16/18 - s/p cholecystectomy. + hepatic cirrhosis. Mild ascites.  - Liver Doppler 05/16/18 - No hepatic, splenic, or portal venous  thrombosis or occlusion. Mild ascites   9. S/P Mechanical AVR - Coumadin held on admit w/ supratherapeutic INR (4.1) - INR 3.4  today  - Continue coumadin, dosing per pharmacy   10. Hypokalemia/Hypomagnesemia - stable. K 4, Mg 1.9    11 GOC:  - End-stage RV failure  - Palliative care following - DNR/DNI  - Dr Haroldine Laws  spoke with him and his daughter. [facility] but he is refusing (has multiple animals at home including 140 pound Rottweiler). Has agreed to [facility]. INot sure this will be sufficient support but he is trying to get an aide at home to help him. Will d/w SW and CM to determine most appropriate dispo  Discussed with [facility] at home.     Length of Stay: [place], NP  01/31/2022, 7:53 AM  Advanced Heart Failure Team Pager [phone] (M-F; 7a - 5p)  Please contact [facility] for night-coverage after hours (5p -7a ) and weekends on [email or web address]   Patient seen and examined with the above-signed Advanced Practice Provider and/or Housestaff. I personally reviewed laboratory data, imaging studies and relevant notes. I independently examined the patient and formulated the important aspects of the plan. I have edited the note to reflect any of my changes or salient points. I have personally discussed the plan with the patient and/or family.  Volume status and renal function stable/improved. Main issue today is LUQ ab  pain worse with inspiration. Has been having good BMs.   General:  Weak  appearing. No resp difficulty HEENT: normal Neck: supple. JVP 6-7. Carotids 2+ bilat; no bruits. No lymphadenopathy or thryomegaly appreciated. Cor: PMI nondisplaced. Irregular rate & rhythm. Mechanical s2 Lungs: clear Abdomen: soft, + mildly tender LUQ, nondistended. No hepatosplenomegaly. No bruits or masses. Good bowel sounds. Extremities: no cyanosis, clubbing, rash, tr edema Neuro: alert & orientedx3, cranial nerves grossly intact. moves all 4 extremities w/o difficulty. Affect pleasant  Overall stable/improved from cardiac standpoint. Would be ready for d/c home when support in place and ab pain improved. Agree with plan for KUB to further assess ab pain.   Continue torsemide. INR 3.4 Discussed dosing with PharmD personally.  Glori Bickers, MD  8:19 AM

## 2022-01-31 NOTE — Progress Notes (Signed)
Progress Note   Patient: Lance Hicks HYQ:[id number] DOB: [birth year] DOA: 01/23/2022     8 DOS: the patient was seen and examined on 01/31/2022   Brief hospital course: DEMARQUES PILZ is a 76 year old male with past medical history significant for chronic systolic congestive heart failure NYHA class III, essential pretension, CAD s/p CABG, aortic stenosis s/p aortic valve replacement on Coumadin, orthostatic hypotension, COPD, pulmonary hypertension, type 2 diabetes mellitus, paroxysmal atrial fibrillation, cirrhosis secondary to EtOH abuse who presented to [facility] ED on 1/30 with progressive lower extremity edema.  Recently hospitalized at [facility] for pneumonia; treated with antibiotics in which his Lasix was held for 3 days for "low blood pressure".  Discharged home and patient started noticed increased swelling of his lower legs and he resumed his Lasix for the past 3 days however with no improvement.  Patient also reporting exertional dyspnea and was noted by his friend to be "very winded" and sent him to the hospital for further evaluation.  Denies cough, no fever/chills.  In the ED, BP was slightly hypotensive, found to be hypoxic and placed on 3 L nasal cannula.  Chest x-ray with bilateral pulmonary congestion.  Cardiology consulted and patient was given 80 mg IV Lasix.  Hospital service consulted for further evaluation management.   Assessment and Plan: Acute on chronic combined systolic and diastolic CHF (congestive heart failure) ([place])- (present on admission) Patient presenting to ED with progressive shortness of breath in the setting of his home Lasix.  Patient was found to be hypoxic requiring submental oxygen to maintain adequate SPO2.  BNP elevated 1831.  Chest x-ray with pulmonary edema, bilateral effusions greater on right. --Cardiology following, appreciate assistance --net negative 156m past 24h and net negative 12.7L since admission --Wt  59.4>57.2>55.5>55>55.>54.75>53.5>53.7kg --toresemide 425mPO BID  --Milrinone drip d/c'd for hypotension --Midodrine 15 mg p.o. TID --Spironolactone 25 mg p.o. daily --torsemide 4056mO BID --Supplemental oxygen remains off --Strict I's and O's and daily weights --Monitor renal function closely daily --Overall poor prognosis, seen by palliative care on 2/1 for assistance with goals of care and medical decision making, now DNR but plan to continue aggressive care with plan to return home with PT/OT, and outpatient palliative care to follow  Chronic atrial fibrillation ([place](present on admission) Rate controlled, not on beta-blocker/CCB outpatient. --Continue Coumadin, pharmacy consulted for dosing/monitoring (INR goal 2.5-3.0) --INR 3.4 today --continue to monitor  [name] --Continue Coumadin, pharmacy consulted for dosing/monitoring --Goal INR 2.5-3.0; 3.4 today --Continue monitor INR daily  Hypotension, chronic- (present on admission) --Continue midodrine 15 mg p.o. TID  Hyperlipidemia- (present on admission) --Crestor 5 mg p.o. daily  GERD (gastroesophageal reflux disease)- (present on admission) --Protonix 40 mg p.o. daily  Alcohol abuse- (present on admission) Counseled on need for cessation. --CIWA protocol with symptom triggered Ativan --Thiamine, multivitamin, folic acid  Iron deficiency anemia- (present on admission) Anemia panel with iron 164, TIBC 360, ferritin 38, folate 29.1, B12 2080. --Ferrous sulfate 325 mg p.o. every other day  Hyponatremia- (present on admission) Sodium 131 on admission, etiology likely secondary to hypervolemic hyponatremia in the setting of volume overload/CHF exacerbation as above. --Na 131>>>125>122>123>126>128>125 --Toresemide and spironolactone as above --BMP daily  Pulmonary arterial hypertension ([place](present on admission) Home regimen includes tadalafil 40 mg p.o. every morning. --Selexipag  200m68mO BID  Hypokalemia, Hypomagnesmia Potassium 3.8, magnesium 2.0 this morning --goal K >4.0 and Mag >2.0 --Continue to follow electrolytes daily  Physical deconditioning- (present on admission) Patient  with significant weakness, debility, gait disturbance and deconditioning from recurrent hospitalization.  PT/OT currently recommends SNF placement, patient declines and states has many friends including the [employer] community will assist him when he returns home. --Continue PT/OT efforts while inpatient, anticipate home with home health when ready for discharge  Protein-calorie malnutrition, severe ([place])- (present on admission) Body mass index is 18.26 kg/m.  Nutrition Status: Nutrition Problem: Increased nutrient needs Etiology: chronic illness (CHF) Signs/Symptoms: estimated needs Interventions: MVI, Prostat, Liberalize Diet, Refer to RD note for recommendations -- Dietitian following, continue to encourage increased oral/protein intake, supplementation  Abdominal pain Patient with history of cirrhosis, also notable for nephrolithiasis.  Complaining of left upper quadrant abdominal pain, acutely onset morning of 01/31/2022.  Worse with deep inspiration.  Reports adequate bowel movements. --Check chest x-ray/KUB        Subjective: Patient seen and examined at bedside, resting comfortably.  No specific complaints other than some mild left upper quadrant abdominal pain that is worse with inspiration this morning.  Patient reports acute onset overnight.  He states has had history of kidney stones, but denies any hematuria.  Overall, breathing improved, still very weak and debilitated and would like to work with physical therapy more in the hospital before going home.  Continues to adamantly decline SNF placement with plan to go home with home health and outpatient pelvic care to follow.  No other specific questions or concerns at this time.  Denies headache, no fever/chills/night sweats,  no nausea/vomiting/diarrhea, no chest pain, palpitations, no paresthesias, no cough/congestion.  No acute events overnight per nursing staff.  Physical Exam: Vitals:   01/30/22 2000 01/30/22 2339 01/31/22 0407 01/31/22 0412  BP: 113/68 (!) 92/55 95/63   Pulse:      Resp: _0 Temp:   97.8 F (36.6 C)   TempSrc:   Oral   SpO2: 96%  94%   Weight:    53.7 kg  Height:       Physical Exam GEN: 76 yo male in NAD, alert and oriented x 3, chronically ill/cachectic/thin in appearance, appears older than stated age HEENT: NCAT, PERRL, EOMI, sclera clear, MMM PULM: CTAB w/o wheezes/crackles, normal respiratory effort, on room air CV: RRR w/o M/G/R GI: abd soft, NTND, NABS, no R/G/M MSK: no peripheral edema, muscle strength globally intact 5/5 bilateral upper/lower extremities NEURO: CN II-XII intact, no focal deficits, sensation to light touch intact PSYCH: normal mood/affect Integumentary: Chronic venous changes with discoloration of bilateral lower extremities, otherwise no concerning rashes/lesions/wounds.   Data Reviewed:  BMP, CBC, INR, urinalysis reviewed.  Family Communication: No family present at bedside this morning; have previously updated patient's daughter [name] via telephone and at bedside but now patient has removed her contact information from the chart  Disposition: Status is: Inpatient  Remains inpatient appropriate because: Needs further inpatient rehabilitation with plan for discharge home with home health and outpatient palliative care to follow even though therapy recommends SNF placement, but patient declines.  Awaiting cardiology sign off.    Planned Discharge Destination: Home with Home Health     Time spent: 46 minutes spent on chart review, discussion with nursing staff, consultants, updating family and interview/physical exam; more than 50% of that time was spent in counseling and/or coordination of care.  Author: Donnamarie Poag [place] DO 01/31/2022 9:16  AM  For on call review [email or web address].

## 2022-01-31 NOTE — Discharge Summary (Signed)
Physician Discharge Summary  Lance Hicks XBJ:[id number] DOB: 09/11/1946 DOA: 01/23/2022  PCP: [name], PA-C  Admit date: 01/23/2022 Discharge date: 01/31/2022  Admitted From: Home Disposition: Home with home health  Recommendations for Outpatient Follow-up:  Follow up with PCP in 1-2 weeks Follow-up with advanced heart failure clinic in 2 weeks Discharge with outpatient palliative care to follow, likely hospice versus residential hospice appropriate as patient has end-stage CHF with overall very poor prognosis.  Home Health: PT/OT/RN/aide/social work Equipment/Devices: None, patient has all the required equipment at home  Discharge Condition: Stable, overall very poor prognosis with a terminal CHF and hospice appropriate CODE STATUS: DNR Diet recommendation: Heart healthy/consistent carbohydrate diet  History of present illness:  Lance Hicks is a 76 year old male with past medical history significant for chronic systolic congestive heart failure NYHA class III, essential pretension, CAD s/p CABG, aortic stenosis s/p aortic valve replacement on Coumadin, orthostatic hypotension, COPD, pulmonary hypertension, type 2 diabetes mellitus, paroxysmal atrial fibrillation, cirrhosis secondary to EtOH abuse who presented to [facility] ED on 1/30 with progressive lower extremity edema.  Recently hospitalized at [facility] for pneumonia; treated with antibiotics in which his Lasix was held for 3 days for "low blood pressure".  Discharged home and patient started noticed increased swelling of his lower legs and he resumed his Lasix for the past 3 days however with no improvement.  Patient also reporting exertional dyspnea and was noted by his friend to be "very winded" and sent him to the hospital for further evaluation.  Denies cough, no fever/chills.  In the ED, BP was slightly hypotensive, found to be hypoxic and placed on 3 L nasal cannula.  Chest x-ray with bilateral pulmonary  congestion.  Cardiology consulted and patient was given 80 mg IV Lasix.  Hospital service consulted for further evaluation management.   Hospital course:  Acute on chronic combined systolic and diastolic CHF (congestive heart failure) ([place]) Patient presenting to ED with progressive shortness of breath in the setting of his home Lasix.  Patient was found to be hypoxic requiring submental oxygen to maintain adequate SPO2.  BNP elevated 1831.  Chest x-ray with pulmonary edema, bilateral effusions greater on right.  Cardiology/advanced heart failure team was consulted and followed during hospital course.  Patient was initially attempted on a milrinone drip but did not tolerate due to hypotension.  Patient continued on midodrine and furosemide drip with good urine output with occasional metolazone doses.  Continues spironolactone 25 mg p.o. daily, torsemide 40 mg p.o. twice daily, metolazone as needed. Overall poor prognosis, seen by palliative care on 2/1 for assistance with goals of care and medical decision making, now DNR but plan to continue aggressive care with plan to return home with PT/OT, and outpatient palliative care to follow.  Patient has end-stage/terminal CHF with recommendations of hospice versus residential hospice but patient declined.  Patient is a high bounce back potential due to his terminal illness; although patient declines anything other than returning home at this time.  Chronic atrial fibrillation (HCC) Rate controlled, not on beta-blocker/CCB outpatient.  Continue Coumadin.   [name] Continue Coumadin  Hypotension, chronic Continue midodrine 15 mg p.o. TID  Hyperlipidemia --Crestor 5 mg p.o. daily  GERD (gastroesophageal reflux disease) Continue home Pepcid  Alcohol abuse Counseled on need for cessation.   Iron deficiency anemia Anemia panel with iron 164, TIBC 360, ferritin 38, folate 29.1, B12 2080.  Continue ferrous sulfate 325 mg  p.o. every other day  Hyponatremia  Sodium level fluctuated during the hospitalization given volume status/volume overload, was 125 at time of discharge.  On torsemide and spironolactone.  Recommend repeat BMP 1 week.  Pulmonary arterial hypertension ([place]) Continue home medications.  Outpatient follow-up with pulmonology/cardiology.  Physical deconditioning Patient with significant weakness, debility, gait disturbance and deconditioning from recurrent hospitalization.  PT/OT currently recommends SNF placement, patient declines and states has many friends including the [employer] community will assist him when he returns home.  Discharging with home health services, although recommended SNF but he declined them multiple attempts during hospitalization.  Overall poor prognosis with high bounce back potential given his terminal illness with recommendation of hospice care.  Hypokalemia, Hypomagnesmia Repleted during hospitalization.  Continue potassium supplementation 40 mg p.o. daily on discharge.  Protein-calorie malnutrition, severe (HCC) Body mass index is 18.26 kg/m.  Nutrition Status: Nutrition Problem: Increased nutrient needs Etiology: chronic illness (CHF) Signs/Symptoms: estimated needs Interventions: MVI, Prostat, Liberalize Diet, Refer to RD note for recommendations Dietitian follow during hospital course, continue to encourage increased oral/protein intake, supplementation  Abdominal pain Patient with history of cirrhosis, also notable for nephrolithiasis.  Complaining of left upper quadrant abdominal pain, acutely onset morning of 01/31/2022.  Worse with deep inspiration.  Reports adequate bowel movements.  KUB with no concerning findings.  Discharge Diagnoses:  Active Problems:   Acute on chronic combined systolic and diastolic CHF (congestive heart failure) (HCC)   Chronic atrial fibrillation (HCC)   [name]   Hypotension, chronic    Hyperlipidemia   GERD (gastroesophageal reflux disease)   Alcohol abuse   Iron deficiency anemia   Hyponatremia   Pulmonary arterial hypertension (HCC)   Hypokalemia, Hypomagnesmia   Physical deconditioning   Protein-calorie malnutrition, severe (HCC)   Abdominal pain    Discharge Instructions  Discharge Instructions     (HEART FAILURE PATIENTS) Call MD:  Anytime you have any of the following symptoms: 1) 3 pound weight gain in 24 hours or 5 pounds in 1 week 2) shortness of breath, with or without a dry hacking cough 3) swelling in the hands, feet or stomach 4) if you have to sleep on extra pillows at night in order to breathe.   Complete by: As directed    Call MD for:  difficulty breathing, headache or visual disturbances   Complete by: As directed    Call MD for:  extreme fatigue   Complete by: As directed    Call MD for:  persistant dizziness or light-headedness   Complete by: As directed    Call MD for:  persistant nausea and vomiting   Complete by: As directed    Call MD for:  severe uncontrolled pain   Complete by: As directed    Call MD for:  temperature >100.4   Complete by: As directed    Diet - low sodium heart healthy   Complete by: As directed    Increase activity slowly   Complete by: As directed       Allergies as of 01/31/2022       Reactions   Augmentin [amoxicillin-pot Clavulanate] Anaphylaxis, Diarrhea   "Upset stomach"   Chicken Allergy Other (See Comments)   Pantoprazole Sodium Nausea Only   Gets gassy and starting itching like crazy Gets gassy and starting itching like crazy   Valium [diazepam] Other (See Comments)   HA and Abd pain.   Tape Rash, Other (See Comments)   Surgical tape   Wound Dressing Adhesive Other (See Comments), Rash  Surgical tape Surgical tape Surgical tape        Medication List     STOP taking these medications    ferrous sulfate 325 (65 FE) MG EC tablet Replaced by: ferrous sulfate 325 (65 FE) MG tablet    tiZANidine 2 MG tablet Commonly known as: ZANAFLEX       TAKE these medications    acetaminophen 500 MG tablet Commonly known as: TYLENOL Take 500-1,000 mg by mouth every 6 (six) hours as needed (pain.).   albuterol 108 (90 Base) MCG/ACT inhaler Commonly known as: VENTOLIN HFA Inhale 2 puffs into the lungs every 6 (six) hours as needed for wheezing or shortness of breath.   ammonium lactate 12 % lotion Commonly known as: LAC-HYDRIN Apply 1 application topically as needed for dry skin.   aspirin 81 MG EC tablet Take 1 tablet (81 mg total) by mouth in the morning.   dapagliflozin propanediol 10 MG Tabs tablet Commonly known as: Farxiga Take 1 tablet (10 mg total) by mouth daily before breakfast.   famotidine 20 MG tablet Commonly known as: PEPCID TAKE 1 TABLET(20 MG) BY MOUTH DAILY What changed: See the new instructions.   ferrous sulfate 325 (65 FE) MG tablet Take 1 tablet (325 mg total) by mouth every other day. Replaces: ferrous sulfate 325 (65 FE) MG EC tablet   KRILL OIL PO Take 800 mg by mouth every other day. In the morning   Magnesium 200 MG Chew Chew 400 mg by mouth daily at 12 noon.   metolazone 2.5 MG tablet Commonly known as: ZAROXOLYN Take 1 tablet (2.5 mg total) by mouth as needed (for fluid).   midodrine 5 MG tablet Commonly known as: PROAMATINE Take 3 tablets (15 mg total) by mouth 3 (three) times daily with meals.   ondansetron 8 MG tablet Commonly known as: Zofran Take 1 tablet (8 mg total) by mouth every 8 (eight) hours as needed for nausea or vomiting.   potassium chloride 10 MEQ tablet Commonly known as: KLOR-CON M Take 4 tablets (40 mEq total) by mouth daily. What changed: when to take this   QUNOL ULTRA COQ10 PO Take 1 capsule by mouth in the morning.   rosuvastatin 5 MG tablet Commonly known as: CRESTOR Take 1 tablet (5 mg total) by mouth daily. What changed: See the new instructions.   spironolactone 25 MG tablet Commonly  known as: ALDACTONE Take 1 tablet (25 mg total) by mouth daily.   tadalafil (PAH) 20 MG tablet Commonly known as: ADCIRCA Take 40 mg by mouth in the morning.   torsemide 20 MG tablet Commonly known as: DEMADEX Take 2 tablets (40 mg total) by mouth 2 (two) times daily. What changed: when to take this   Turmeric 500 MG Tabs Take 1 capsule by mouth daily in the afternoon. With Ginger 50 mg   vitamin B-12 1000 MCG tablet Commonly known as: CYANOCOBALAMIN Take 1,000 mcg by mouth daily at 12 noon.   VITAMIN C GUMMIE PO Take 2 tablets by mouth daily at 12 noon.   Vitamin D3 50 MCG (2000 UT) Tabs Take 2,000 Units by mouth daily at 12 noon.   warfarin 5 MG tablet Commonly known as: COUMADIN Take as directed. If you are unsure how to take this medication, talk to your nurse or doctor. Original instructions: Take 1/2 to 1 tablets (2.5-5 mg total) by mouth See admin instructions. Pt takes a full tablet 2 days a week and 1/2 tablet 5 days a week. What  changed:  how much to take how to take this when to take this additional instructions   Zinc 50 MG Tabs Take 50 mg by mouth every other day. In the afternoon        Follow-up Information     Saguier, Iris Pert. Schedule an appointment as soon as possible for a visit in 1 week(s).   Specialties: Internal Medicine, Family Medicine Contact information: [address] [place] [address] [place]. Schedule an appointment as soon as possible for a visit in 2 week(s).   Specialty: Cardiology Contact information: [address] [id number] [place] [address] [phone]               Allergies  Allergen Reactions   Augmentin [Amoxicillin-Pot Clavulanate] Anaphylaxis and Diarrhea    "Upset stomach"   Chicken Allergy Other (See Comments)   Pantoprazole Sodium Nausea Only    Gets gassy and starting itching  like crazy Gets gassy and starting itching like crazy   Valium [Diazepam] Other (See Comments)    HA and Abd pain.   Tape Rash and Other (See Comments)    Surgical tape   Wound Dressing Adhesive Other (See Comments) and Rash    Surgical tape Surgical tape Surgical tape    Consultations: Cardiology/advanced heart failure team   Procedures/Studies: DG Chest 2 View  Result Date: 01/23/2022 CLINICAL DATA:  A 76 year old male presents for evaluation of shortness of breath. EXAM: CHEST - 2 VIEW COMPARISON:  January 12, 2022. FINDINGS: Signs of median sternotomy for aortic valve replacement. Cardiomediastinal contours are stable. Increased interstitial markings throughout the chest. Blunting of the RIGHT costodiaphragmatic sulcus and to a lesser extent small effusion also on the LEFT. Bibasilar airspace disease. Fullness of central pulmonary vasculature. On limited assessment there is no acute skeletal process. IMPRESSION: 1. Findings suggest pulmonary edema with bilateral effusions, greater on the RIGHT. 2. Bibasilar airspace disease, more likely atelectasis in the current context. Correlate with signs of heart failure. Electronically Signed   By: Zetta Bills M.D.   On: 01/23/2022 13:01   DG Chest 2 View  Result Date: 01/12/2022 CLINICAL DATA:  Bilateral leg swelling. EXAM: CHEST - 2 VIEW COMPARISON:  Chest x-ray dated November 04, 2021. FINDINGS: Stable cardiomegaly status post AVR and CABG. Chronic mild diffuse interstitial thickening. No focal consolidation, pleural effusion, or pneumothorax. No acute osseous abnormality. IMPRESSION: 1. No acute cardiopulmonary disease. Electronically Signed   By: Titus Dubin M.D.   On: 01/12/2022 15:49   DG Abd 1 View  Result Date: 01/31/2022 CLINICAL DATA:  Acute left lower quadrant abdominal pain. EXAM: ABDOMEN - 1 VIEW COMPARISON:  None. FINDINGS: The bowel gas pattern is normal. No radio-opaque calculi or other significant radiographic abnormality  are seen. IMPRESSION: Negative. Aortic Atherosclerosis (ICD10-I70.0). Electronically Signed   By: Marijo Conception M.D.   On: 01/31/2022 08:44   DG CHEST PORT 1 VIEW  Result Date: 01/31/2022 CLINICAL DATA:  Shortness of breath.  Cough. EXAM: PORTABLE CHEST 1 VIEW COMPARISON:  One view chest x-ray 01/26/2022 FINDINGS: Heart is enlarged. Interstitial pattern is slightly increased from the prior study, suggesting edema or infection superimposed on chronic interstitial markings. No focal consolidation is present. IMPRESSION: Cardiomegaly with increasing interstitial pattern, suggesting edema or infection superimposed on chronic interstitial markings. Electronically Signed   By: San Morelle M.D.   On: 01/31/2022 08:43  DG CHEST PORT 1 VIEW  Result Date: 01/26/2022 CLINICAL DATA:  Right pleural effusion EXAM: PORTABLE CHEST 1 VIEW COMPARISON:  01/23/2022 FINDINGS: Bilateral diffuse interstitial thickening. Small right pleural effusion. No left pleural effusion. No pneumothorax. Stable cardiomegaly. Prior median sternotomy and aortic valve replacement. No acute osseous abnormality. IMPRESSION: 1. Findings concerning for CHF. Electronically Signed   By: Kathreen Devoid M.D.   On: 01/26/2022 10:22   [place] EKG SITE RITE  Result Date: 01/24/2022 If Site Rite image not attached, placement could not be confirmed due to current cardiac rhythm.    Subjective: Patient seen examined bedside, resting comfortably.  Complained of some abdominal pain with KUB and urinalysis unrevealing.  Has been ambulating with the use of a walker with no significant issues.  Reiterated once again that recommendations are for SNF placement versus hospice, patient declines and wishes to return home today with home health services.  Discussed with him regarding his terminal illness, and wishes to return home with home health services at this time.  No other questions or concerns at this time.  Denies headache, no fever/chills/night  sweats, no dizziness, no chest pain, no palpitations, no shortness of breath, no cough/congestion, no nausea/vomiting/diarrhea, no fatigue, no paresthesias.  No acute events overnight per nursing staff.  Discharge Exam: Vitals:   01/30/22 2339 01/31/22 0407  BP: (!) 92/55 95/63  Pulse:    Resp: 20 20  Temp:  97.8 F (36.6 C)  SpO2:  94%   Vitals:   01/30/22 2000 01/30/22 2339 01/31/22 0407 01/31/22 0412  BP: 113/68 (!) 92/55 95/63   Pulse:      Resp: _0 Temp:   97.8 F (36.6 C)   TempSrc:   Oral   SpO2: 96%  94%   Weight:    53.7 kg  Height:        Physical Exam: GEN: NAD, alert and oriented x 3, chronically ill/thin/cachectic in appearance, appears older than stated age HEENT: NCAT, PERRL, EOMI, sclera clear, MMM PULM: CTAB w/o wheezes/crackles, normal respiratory effort, on room air CV: RRR w/o M/G/R GI: abd soft, NTND, NABS, no R/G/M MSK: no peripheral edema, muscle strength globally intact 5/5 bilateral upper/lower extremities NEURO: CN II-XII intact, no focal deficits, sensation to light touch intact PSYCH: normal mood/affect Integumentary: Chronic venous changes with discoloration of bilateral lower extremities, otherwise no concerning rashes/lesions/wounds.    The results of significant diagnostics from this hospitalization (including imaging, microbiology, ancillary and laboratory) are listed below for reference.     Microbiology: Recent Results (from the past 240 hour(s))  Culture, blood (routine x 2)     Status: None   Collection Time: 01/23/22  4:05 PM   Specimen: BLOOD  Result Value Ref Range Status   Specimen Description BLOOD BLOOD RIGHT WRIST  Final   Special Requests   Final    BOTTLES DRAWN AEROBIC AND ANAEROBIC Blood Culture adequate volume   Culture   Final    NO GROWTH 5 DAYS Performed at  Hospital Lab, [address], [place],  [address]    Report Status 01/28/2022 FINAL  Final  Culture, blood (routine x 2)     Status:  None   Collection Time: 01/23/22  4:30 PM   Specimen: BLOOD  Result Value Ref Range Status   Specimen Description BLOOD LEFT ANTECUBITAL  Final   Special Requests   Final    BOTTLES DRAWN AEROBIC AND ANAEROBIC Blood Culture adequate volume   Culture  Final    NO GROWTH 5 DAYS Performed at [facility], [place] [address], [place], [place] [address]    Report Status 01/28/2022 FINAL  Final  Resp Panel by RT-PCR (Flu A&B, Covid) Nasopharyngeal Swab     Status: None   Collection Time: 01/23/22  7:47 PM   Specimen: Nasopharyngeal Swab; Nasopharyngeal(NP) swabs in vial transport medium  Result Value Ref Range Status   SARS Coronavirus 2 by RT PCR NEGATIVE NEGATIVE Final    Comment: (NOTE) SARS-CoV-2 target nucleic acids are NOT DETECTED.  The SARS-CoV-2 RNA is generally detectable in upper respiratory specimens during the acute phase of infection. The lowest concentration of SARS-CoV-2 viral copies this assay can detect is 138 copies/mL. A negative result does not preclude SARS-Cov-2 infection and should not be used as the sole basis for treatment or other patient management decisions. A negative result may occur with  improper specimen collection/handling, submission of specimen other than nasopharyngeal swab, presence of viral mutation(s) within the areas targeted by this assay, and inadequate number of viral copies(<138 copies/mL). A negative result must be combined with clinical observations, patient history, and epidemiological information. The expected result is Negative.  Fact Sheet for Patients:  EntrepreneurPulse.com.au  Fact Sheet for Healthcare Providers:  IncredibleEmployment.be  This test is no t yet approved or cleared by the [place] FDA and  has been authorized for detection and/or diagnosis of SARS-CoV-2 by FDA under an Emergency Use Authorization (EUA). This EUA will remain  in effect (meaning this test can be used) for  the duration of the COVID-19 declaration under Section 564(b)(1) of the Act, 21 U.S.C.section 360bbb-3(b)(1), unless the authorization is terminated  or revoked sooner.       Influenza A by PCR NEGATIVE NEGATIVE Final   Influenza B by PCR NEGATIVE NEGATIVE Final    Comment: (NOTE) The Xpert Xpress SARS-CoV-2/FLU/RSV plus assay is intended as an aid in the diagnosis of influenza from Nasopharyngeal swab specimens and should not be used as a sole basis for treatment. Nasal washings and aspirates are unacceptable for Xpert Xpress SARS-CoV-2/FLU/RSV testing.  Fact Sheet for Patients: EntrepreneurPulse.com.au  Fact Sheet for Healthcare Providers: IncredibleEmployment.be  This test is not yet approved or cleared by the [place] FDA and has been authorized for detection and/or diagnosis of SARS-CoV-2 by FDA under an Emergency Use Authorization (EUA). This EUA will remain in effect (meaning this test can be used) for the duration of the COVID-19 declaration under Section 564(b)(1) of the Act, 21 U.S.C. section 360bbb-3(b)(1), unless the authorization is terminated or revoked.  Performed at [facility], [place] [address], [place], [place] [address]      Labs: BNP (last 3 results) Recent Labs    09/30/21 0241 01/12/22 1524 01/23/22 1145  BNP 840.7* 1,341.7* 6,546.5*   Basic Metabolic Panel: Recent Labs  Lab 01/27/22 0301 01/27/22 0601 01/28/22 0351 01/28/22 2106 01/29/22 0328 01/30/22 0155 01/31/22 0421  NA 123* 122* 123* 123* 126* 128* 125*  K 5.4* 4.7 4.6  --  3.7 3.8 4.0  CL 87* 88* 87*  --  88* 87* 86*  CO2 _0 --  _1 GLUCOSE 95 92 74  --  93 92 91  BUN 25* 26* 33*  --  35* 29* 29*  CREATININE 1.66* 1.74* 1.99*  --  1.98* 1.47* 1.45*  CALCIUM 9.6 9.4 9.5  --  9.3 9.2 9.2  MG 2.6*  --  2.4  --  2.0 2.0 1.9   Liver Function Tests: Recent Labs  Lab 01/27/22 0301  AST 42*  ALT 17  ALKPHOS 76  BILITOT  2.1*  PROT 7.1  ALBUMIN 3.1*   No results for input(s): LIPASE, AMYLASE in the last 168 hours. No results for input(s): AMMONIA in the last 168 hours. CBC: Recent Labs  Lab 01/27/22 0301 01/28/22 0351 01/29/22 0328 01/30/22 0155 01/31/22 0421  WBC 5.9 5.6 6.1 6.1 5.2  HGB 11.9* 11.5* 11.8* 12.1* 11.8*  HCT 35.3* 35.4* 35.3* 38.4* 35.7*  MCV 77.8* 79.2* 78.4* 80.2 79.0*  PLT 137* 139* 153 196 206   Cardiac Enzymes: No results for input(s): CKTOTAL, CKMB, CKMBINDEX, TROPONINI in the last 168 hours. BNP: Invalid input(s): POCBNP CBG: Recent Labs  Lab 01/30/22 0624 01/30/22 1139 01/30/22 1624 01/30/22 2304 01/31/22 0637  GLUCAP 80 144* 103* 142* 88   D-Dimer No results for input(s): DDIMER in the last 72 hours. Hgb A1c No results for input(s): HGBA1C in the last 72 hours. Lipid Profile No results for input(s): CHOL, HDL, LDLCALC, TRIG, CHOLHDL, LDLDIRECT in the last 72 hours. Thyroid function studies No results for input(s): TSH, T4TOTAL, T3FREE, THYROIDAB in the last 72 hours.  Invalid input(s): FREET3 Anemia work up No results for input(s): VITAMINB12, FOLATE, FERRITIN, TIBC, IRON, RETICCTPCT in the last 72 hours. Urinalysis    Component Value Date/Time   COLORURINE YELLOW 01/30/2022 2317   APPEARANCEUR CLEAR 01/30/2022 2317   LABSPEC 1.012 01/30/2022 2317   PHURINE 6.0 01/30/2022 2317   GLUCOSEU NEGATIVE 01/30/2022 2317   HGBUR NEGATIVE 01/30/2022 2317   BILIRUBINUR NEGATIVE 01/30/2022 2317   BILIRUBINUR negative 06/17/2021 1521   KETONESUR NEGATIVE 01/30/2022 2317   PROTEINUR NEGATIVE 01/30/2022 2317   UROBILINOGEN 0.2 06/17/2021 1521   NITRITE NEGATIVE 01/30/2022 2317   LEUKOCYTESUR NEGATIVE 01/30/2022 2317   Sepsis Labs Invalid input(s): PROCALCITONIN,  WBC,  LACTICIDVEN Microbiology Recent Results (from the past 240 hour(s))  Culture, blood (routine x 2)     Status: None   Collection Time: 01/23/22  4:05 PM   Specimen: BLOOD  Result Value Ref  Range Status   Specimen Description BLOOD BLOOD RIGHT WRIST  Final   Special Requests   Final    BOTTLES DRAWN AEROBIC AND ANAEROBIC Blood Culture adequate volume   Culture   Final    NO GROWTH 5 DAYS Performed at [facility], [place] [address] [place], [place] [address]    Report Status 01/28/2022 FINAL  Final  Culture, blood (routine x 2)     Status: None   Collection Time: 01/23/22  4:30 PM   Specimen: BLOOD  Result Value Ref Range Status   Specimen Description BLOOD LEFT ANTECUBITAL  Final   Special Requests   Final    BOTTLES DRAWN AEROBIC AND ANAEROBIC Blood Culture adequate volume   Culture   Final    NO GROWTH 5 DAYS Performed at [facility], [place] [address], [place], [place] [address]    Report Status 01/28/2022 FINAL  Final  Resp Panel by RT-PCR (Flu A&B, Covid) Nasopharyngeal Swab     Status: None   Collection Time: 01/23/22  7:47 PM   Specimen: Nasopharyngeal Swab; Nasopharyngeal(NP) swabs in vial transport medium  Result Value Ref Range Status   SARS Coronavirus 2 by RT PCR NEGATIVE NEGATIVE Final    Comment: (NOTE) SARS-CoV-2 target nucleic acids are NOT DETECTED.  The SARS-CoV-2 RNA is generally detectable in upper respiratory specimens during the acute phase  of infection. The lowest concentration of SARS-CoV-2 viral copies this assay can detect is 138 copies/mL. A negative result does not preclude SARS-Cov-2 infection and should not be used as the sole basis for treatment or other patient management decisions. A negative result may occur with  improper specimen collection/handling, submission of specimen other than nasopharyngeal swab, presence of viral mutation(s) within the areas targeted by this assay, and inadequate number of viral copies(<138 copies/mL). A negative result must be combined with clinical observations, patient history, and epidemiological information. The expected result is Negative.  Fact Sheet for Patients:   EntrepreneurPulse.com.au  Fact Sheet for Healthcare Providers:  IncredibleEmployment.be  This test is no t yet approved or cleared by the [place] FDA and  has been authorized for detection and/or diagnosis of SARS-CoV-2 by FDA under an Emergency Use Authorization (EUA). This EUA will remain  in effect (meaning this test can be used) for the duration of the COVID-19 declaration under Section 564(b)(1) of the Act, 21 U.S.C.section 360bbb-3(b)(1), unless the authorization is terminated  or revoked sooner.       Influenza A by PCR NEGATIVE NEGATIVE Final   Influenza B by PCR NEGATIVE NEGATIVE Final    Comment: (NOTE) The Xpert Xpress SARS-CoV-2/FLU/RSV plus assay is intended as an aid in the diagnosis of influenza from Nasopharyngeal swab specimens and should not be used as a sole basis for treatment. Nasal washings and aspirates are unacceptable for Xpert Xpress SARS-CoV-2/FLU/RSV testing.  Fact Sheet for Patients: EntrepreneurPulse.com.au  Fact Sheet for Healthcare Providers: IncredibleEmployment.be  This test is not yet approved or cleared by the [place] FDA and has been authorized for detection and/or diagnosis of SARS-CoV-2 by FDA under an Emergency Use Authorization (EUA). This EUA will remain in effect (meaning this test can be used) for the duration of the COVID-19 declaration under Section 564(b)(1) of the Act, 21 U.S.C. section 360bbb-3(b)(1), unless the authorization is terminated or revoked.  Performed at [facility], [place] [address], [place],  [address]      Time coordinating discharge: Over 30 minutes  SIGNED:   Izayiah Tibbitts J [place] DO  [facility] 01/31/2022, 10:34 AM

## 2022-01-31 NOTE — TOC CM/SW Note (Signed)
HF TOC CM spoke to pt and states he brought his Inogen tank with him to ED. CM sent message to ED RN and CM. CM, Camellia will search downstairs in ED for tank. Will can pt's family to ensure he did bring with him to hospital. TOC CM spoke to [place], Nate, they took Inogen tank home from ED. [place], Heart Failure TOC CM [phone]

## 2022-01-31 NOTE — Progress Notes (Signed)
ANTICOAGULATION CONSULT NOTE - Follow Up Consult  Pharmacy Consult for Warfarin Indication: mechanical AVR  Allergies  Allergen Reactions   Augmentin [Amoxicillin-Pot Clavulanate] Anaphylaxis and Diarrhea    "Upset stomach"   Chicken Allergy Other (See Comments)   Pantoprazole Sodium Nausea Only    Gets gassy and starting itching like crazy Gets gassy and starting itching like crazy   Valium [Diazepam] Other (See Comments)    HA and Abd pain.   Tape Rash and Other (See Comments)    Surgical tape   Wound Dressing Adhesive Other (See Comments) and Rash    Surgical tape Surgical tape Surgical tape    Vital Signs: Temp: 98.3 F (36.8 C) (02/07 1059) Temp Source: Oral (02/07 1059) BP: 101/63 (02/07 1059) Pulse Rate: 58 (02/07 1059)  Labs: Recent Labs    01/29/22 0328 01/30/22 0155 01/31/22 0421  HGB 11.8* 12.1* 11.8*  HCT 35.3* 38.4* 35.7*  PLT 153 196 206  LABPROT 34.6* 34.2* 34.4*  INR 3.4* 3.4* 3.4*  CREATININE 1.98* 1.47* 1.45*     Estimated Creatinine Clearance: 33.9 mL/min (A) (by C-G formula based on SCr of 1.45 mg/dL (H)).   Medications:  Scheduled:   (feeding supplement) PROSource Plus  30 mL Oral BID BM   aspirin EC  81 mg Oral q AM   docusate sodium  200 mg Oral BID   famotidine  10 mg Oral Daily   ferrous sulfate  325 mg Oral QODAY   folic acid  1 mg Oral Daily   insulin aspart  0-9 Units Subcutaneous TID WC   mouth rinse  15 mL Mouth Rinse BID   melatonin  6 mg Oral QHS   midodrine  15 mg Oral TID WC   multivitamin with minerals  1 tablet Oral Daily   pantoprazole  40 mg Oral Daily   polyethylene glycol  17 g Oral Daily   potassium chloride  40 mEq Oral Daily   rosuvastatin  5 mg Oral Daily   Selexipag  200 mcg Oral BID   sodium chloride flush  3 mL Intravenous Q12H   spironolactone  25 mg Oral Daily   thiamine  100 mg Oral Daily   Or   thiamine  100 mg Intravenous Daily   tiZANidine  2 mg Oral TID   torsemide  40 mg Oral BID    traZODone  50 mg Oral QHS   Warfarin - Pharmacist Dosing Inpatient   Does not apply q1600    Assessment: 76 y.o. male with medical history significant of chronic systolic CHF NYHA class III, HTN, CAD and aortic stenosis status post CABG and mechanical AVR on Coumadin, orthostatic hypotension, COPD and pulmonary hypertension, IIDM, PAF, liver cirrhosis secondary to alcohol abuse, presented with increasing leg swelling.  Home regimen per patient: warfarin 8m MonWed and warfarin 2.520mall other days.   INR remains elevated at 3.4 today. Hgb 11.8, plt 206 - no s/sx of bleeding. Oral intake variable (10-100% documented).   Goal of Therapy:  INR goal 2.5-3.0 (per clinic note) Monitor platelets by anticoagulation protocol: Yes   Plan:  Continue to hold warfarin tonight to allow INR to trend down  Daily INR, s/s bleeding  Thank you for allowing pharmacy to participate in this patient's care.  JeNevada CranePhRoylene ReasonBCEye Surgery Center Of [facility] Inclinical Pharmacist  01/31/2022 11:41 AM   [facility] phone numbers are listed on [email or web address]

## 2022-02-01 DIAGNOSIS — N179 Acute kidney failure, unspecified: Secondary | ICD-10-CM

## 2022-02-01 DIAGNOSIS — I482 Chronic atrial fibrillation, unspecified: Secondary | ICD-10-CM | POA: Diagnosis not present

## 2022-02-01 DIAGNOSIS — I5043 Acute on chronic combined systolic (congestive) and diastolic (congestive) heart failure: Secondary | ICD-10-CM | POA: Diagnosis not present

## 2022-02-01 DIAGNOSIS — N189 Chronic kidney disease, unspecified: Secondary | ICD-10-CM

## 2022-02-01 DIAGNOSIS — E871 Hypo-osmolality and hyponatremia: Secondary | ICD-10-CM | POA: Diagnosis not present

## 2022-02-01 LAB — PROTIME-INR
INR: 2.5 — ABNORMAL HIGH (ref 0.8–1.2)
Prothrombin Time: 27 seconds — ABNORMAL HIGH (ref 11.4–15.2)

## 2022-02-01 LAB — CBC
HCT: 38.6 % — ABNORMAL LOW (ref 39.0–52.0)
Hemoglobin: 12.5 g/dL — ABNORMAL LOW (ref 13.0–17.0)
MCH: 26 pg (ref 26.0–34.0)
MCHC: 32.4 g/dL (ref 30.0–36.0)
MCV: 80.2 fL (ref 80.0–100.0)
Platelets: 227 10*3/uL (ref 150–400)
RBC: 4.81 MIL/uL (ref 4.22–5.81)
RDW: 21.9 % — ABNORMAL HIGH (ref 11.5–15.5)
WBC: 4.4 10*3/uL (ref 4.0–10.5)
nRBC: 0 % (ref 0.0–0.2)

## 2022-02-01 LAB — COOXEMETRY PANEL
Carboxyhemoglobin: 2 % — ABNORMAL HIGH (ref 0.5–1.5)
Methemoglobin: 0.8 % (ref 0.0–1.5)
O2 Saturation: 71.4 %
Total hemoglobin: 12.7 g/dL (ref 12.0–16.0)

## 2022-02-01 LAB — GLUCOSE, CAPILLARY
Glucose-Capillary: 90 mg/dL (ref 70–99)
Glucose-Capillary: 128 mg/dL — ABNORMAL HIGH (ref 70–99)
Glucose-Capillary: 84 mg/dL (ref 70–99)
Glucose-Capillary: 114 mg/dL — ABNORMAL HIGH (ref 70–99)

## 2022-02-01 LAB — BASIC METABOLIC PANEL
Anion gap: 12 (ref 5–15)
BUN: 30 mg/dL — ABNORMAL HIGH (ref 8–23)
CO2: 28 mmol/L (ref 22–32)
Calcium: 9.5 mg/dL (ref 8.9–10.3)
Chloride: 87 mmol/L — ABNORMAL LOW (ref 98–111)
Creatinine, Ser: 1.8 mg/dL — ABNORMAL HIGH (ref 0.61–1.24)
GFR, Estimated: 39 mL/min — ABNORMAL LOW (ref >60)
Glucose, Bld: 74 mg/dL (ref 70–99)
Potassium: 4.2 mmol/L (ref 3.5–5.1)
Sodium: 127 mmol/L — ABNORMAL LOW (ref 135–145)

## 2022-02-01 LAB — MAGNESIUM: Magnesium: 2.1 mg/dL (ref 1.7–2.4)

## 2022-02-01 MED ORDER — OXYCODONE HCL 5 MG PO TABS
5.0000 mg | ORAL_TABLET | ORAL | Status: DC | PRN
  Administered 2022-02-01 – 2022-02-05 (×18): 5 mg via ORAL
  Filled 2022-02-01 (×18): qty 1

## 2022-02-01 MED ORDER — MORPHINE SULFATE (PF) 2 MG/ML IV SOLN
1.0000 mg | INTRAVENOUS | Status: DC | PRN
  Administered 2022-02-01 – 2022-02-04 (×4): 1 mg via INTRAVENOUS
  Filled 2022-02-01 (×4): qty 1

## 2022-02-01 MED ORDER — OXYCODONE HCL 5 MG PO TABS
5.0000 mg | ORAL_TABLET | ORAL | Status: DC | PRN
  Administered 2022-02-01: 5 mg via ORAL
  Filled 2022-02-01: qty 1

## 2022-02-01 MED ORDER — WARFARIN SODIUM 1 MG PO TABS
1.0000 mg | ORAL_TABLET | Freq: Once | ORAL | Status: DC
  Filled 2022-02-01: qty 1

## 2022-02-01 MED ORDER — TRAMADOL HCL 50 MG PO TABS: 100.0000 mg | ORAL_TABLET | Freq: Three times a day (TID) | ORAL | Status: DC | PRN

## 2022-02-01 NOTE — Progress Notes (Signed)
Occupational Therapy Treatment Patient Details Name: Lance Hicks MRN: [id number] DOB: Jun 10, 1946 Today's Date: 02/01/2022   History of present illness 76 yo male presenting to ED on 1/30 with increased LE edema and shortness of breath. Patient with recent admit to [facility] with  pneumonia and accidental ingestion of feul injection fluid, but leaving AMA. PHM includes: a fib, a flutter, chronic anticoagulation, chronic back pain, COPD, CAD, dyspnea, HTN, aortic valve replacement 2018, CABG 2018, HLD, kidney stones, leukocytoclastic vasculitis, pleural effusion, syncope, wrist and forearm fx surgery, and alcohol abuse.   OT comments  Patient continues to make incremental progress towards goals in skilled OT session. Patient's session encompassed education with regard to theraband (Level 2) and grooming at bed level. Patient able to verbally walk through a number of BUE exercises to complete, how declining with therapist due to increased time and fatigue. Patient performing grooming in bed at end of session. Of note, patient is now going home with hospice. Therapy will continue to follow.      Recommendations for follow up therapy are one component of a multi-disciplinary discharge planning process, led by the attending physician.  Recommendations may be updated based on patient status, additional functional criteria and insurance authorization.    Follow Up Recommendations  Home health OT    Assistance Recommended at Discharge Intermittent Supervision/Assistance  Patient can return home with the following  A little help with walking and/or transfers;A little help with bathing/dressing/bathroom;Assistance with cooking/housework;Assist for transportation   Equipment Recommendations  BSC/3in1    Recommendations for Other Services      Precautions / Restrictions Precautions Precautions: Fall Precaution Comments: monitor O2 sats Restrictions Weight Bearing Restrictions: No        Mobility Bed Mobility                    Transfers                   General transfer comment: deferred     Balance                                           ADL either performed or assessed with clinical judgement   ADL Overall ADL's : Needs assistance/impaired     Grooming: Set up;Wash/dry hands;Wash/dry face Grooming Details (indicate cue type and reason): able to complete shaving with electric razor                             Functional mobility during ADLs: Minimal assistance;Cueing for safety;Cueing for sequencing General ADL Comments: Patient completing grooming tasks in bed after education of theraband    Extremity/Trunk Assessment              Vision       Perception     Praxis      Cognition Arousal/Alertness: Awake/alert Behavior During Therapy: WFL for tasks assessed/performed Overall Cognitive Status: Within Functional Limits for tasks assessed                                          Exercises Other Exercises Other Exercises: Provided level 2 theraband for upper extremity exercises, patient able to verbally walk through a number of exercises to increase BUE  strength    Shoulder Instructions       General Comments      Pertinent Vitals/ Pain       Pain Assessment Pain Assessment: Faces Faces Pain Scale: Hurts whole lot Pain Location: L rib cage Pain Descriptors / Indicators: Aching, Burning, Constant, Discomfort Pain Intervention(s): Limited activity within patient's tolerance, Monitored during session, Heat applied  Home Living                                          Prior Functioning/Environment              Frequency  Min 2X/week        Progress Toward Goals  OT Goals(current goals can now be found in the care plan section)  Progress towards OT goals: Progressing toward goals  Acute Rehab OT Goals Patient Stated Goal: to go  home OT Goal Formulation: With patient Time For Goal Achievement: 02/08/22 Potential to Achieve Goals: [place] Discharge plan remains appropriate    Co-evaluation                 AM-PAC OT "6 Clicks" Daily Activity     Outcome Measure   Help from another person eating meals?: None Help from another person taking care of personal grooming?: A Little Help from another person toileting, which includes using toliet, bedpan, or urinal?: A Little Help from another person bathing (including washing, rinsing, drying)?: A Little Help from another person to put on and taking off regular upper body clothing?: A Little Help from another person to put on and taking off regular lower body clothing?: A Little 6 Click Score: 19    End of Session Equipment Utilized During Treatment: Other (comment) (Theraband)  OT Visit Diagnosis: Unsteadiness on feet (R26.81);Other abnormalities of gait and mobility (R26.89);Muscle weakness (generalized) (M62.81)   Activity Tolerance Patient limited by fatigue;Patient limited by pain;Patient limited by lethargy   Patient Left in bed;with call bell/phone within reach;with bed alarm set   Nurse Communication Mobility status        Time: 1349-1400 OT Time Calculation (min): 11 min  Charges: OT General Charges $OT Visit: 1 Visit OT Treatments $Self Care/Home Management : 8-22 mins  Corinne Ports E. Leul Narramore, OTR/L Acute Rehabilitation Services [phone] [place] 02/01/2022, 3:20 PM

## 2022-02-01 NOTE — TOC Progression Note (Addendum)
Transition of Care [facility]) - Progression Note    Patient Details  Name: Lance Hicks MRN: [id number] Date of Birth: 1946-11-12  [facility] of [facility] CM/SW Contact  Yamilee Harmes, LCSW Phone Number: 02/01/2022, 10:19 AM  Clinical Narrative:    HF CSW reached out to [place] or [facility] to make a referral for residential/inpatient Hospice. [facility] [place] will be in touch with the active health care agent Illene Bolus [phone] listed in the chart. 3:12pm - HF CSW received a call from [place] who reported that she just visited Mr. Giangregorio who reported "I ain't going to no damn hospice." Mr. Schubring reported that Dr. Linna Hoff is getting him a tiny apartment in [place]. HF CSW shared this with the treatment team and notified Amy Ninfa Meeker of the interaction between Hospice and Mr. Umstead.  CSW will continue to follow throughout discharge.   Expected Discharge Plan: [place] Barriers to Discharge: Continued Medical Work up  Expected Discharge Plan and Services Expected Discharge Plan: [place] In-house Referral: Clinical Social Work Discharge Planning Services: CM Consult Post Acute Care Choice: [place] arrangements for the past 2 months: Single Family Home Expected Discharge Date: 01/31/22                         HH Arranged: RN, PT [place] Agency: [place] ([place]) Date [place]: 01/26/22 Time [place]: 1559 Representative spoke with at [place]: [place] ([place]) Interventions Food Insecurity Interventions: Intervention Not Indicated Financial Strain Interventions: Intervention Not Indicated Housing Interventions: Intervention Not Indicated  Readmission Risk Interventions No flowsheet data found.  Roshelle Traub, MSW, LCSW [phone] Heart Failure Social Worker

## 2022-02-01 NOTE — Progress Notes (Signed)
PT Cancellation Note  Patient Details Name: Lance Hicks MRN: [id number] DOB: 11-13-1946   Cancelled Treatment:    Reason Eval/Treat Not Completed: Fatigue/lethargy limiting ability to participate at this time. Pt also noted to have d/c plan of residential Hospice, therefore, pt not pushed to participate at this time, will follow from a distance to mobilize and maintain strength as aligns with current goals of care.   [name], PT, DPT   Acute Rehabilitation Department Pager #: [phone]   [name] 02/01/2022, 10:57 AM

## 2022-02-01 NOTE — Progress Notes (Signed)
Mobility Specialist Progress Note:   02/01/22 1230  Mobility  Activity Transferred to/from [facility]  Level of Assistance Moderate assist, patient does 50-74%  Assistive Device Front wheel walker  Distance Ambulated (ft) 4 ft  Activity Response Tolerated well  $Mobility charge 1 Mobility   Pt requesting to go to BSC, BM unsuccessful. Pt required modA throughout transfer. Back in bed with bed alarm on, RN present.   Nelta Numbers Mobility Specialist  Phone [phone]

## 2022-02-01 NOTE — Progress Notes (Addendum)
Advanced Heart Failure Rounding Note  PCP-Cardiologist: None   Subjective:   1/31 Started on milrinone 0.25 mcg + lasix 20 mg+ metolazone.  2/2 Milrinone stopped due to hypotension.   2/7 CT Renal Stone-- no obstruction, no hydronephrosis. Small nonobstructing renal stones. KU was negative.   Complaining of left rib discomfort. Says he cant stand up due to pain. Requesting pain medication.    Objective:   Weight Range: 54.1 kg Body mass index is 17.61 kg/m.   Vital Signs:   Temp:  [97.6 F (36.4 C)-98.3 F (36.8 C)] 97.6 F (36.4 C) (02/08 0400) Pulse Rate:  [37-61] 61 (02/08 0400) Resp:  [16-20] 16 (02/08 0353) BP: (99-126)/(63-84) 101/77 (02/08 0400) SpO2:  [90 %-96 %] 92 % (02/08 0400) Weight:  [54.1 kg] 54.1 kg (02/08 0358) Last BM Date: 01/31/22  Weight change: Filed Weights   01/30/22 0322 01/31/22 0412 02/01/22 0358  Weight: 53.5 kg 53.7 kg 54.1 kg    Intake/Output:   Intake/Output Summary (Last 24 hours) at 02/01/2022 0940 Last data filed at 02/01/2022 0845 Gross per 24 hour  Intake 765 ml  Output 1825 ml  Net -1060 ml      Physical Exam  General: Thin, frail.  HEENT: normal Neck: supple. JVP 7-8 . Carotids 2+ bilat; no bruits. No lymphadenopathy or thryomegaly appreciated. Cor: PMI nondisplaced. Irregular rate & rhythm. No rubs, gallops or murmurs. Lungs: clear Abdomen: soft, nontender, nondistended. No hepatosplenomegaly. No bruits or masses. Good bowel sounds. Extremities: no cyanosis, clubbing, rash, edema Neuro: alert & orientedx3, cranial nerves grossly intact. moves all 4 extremities w/o difficulty. Affect pleasant  Telemetry   Afib with occasional PVCs. 40-60s    Labs    CBC Recent Labs    01/31/22 0421 02/01/22 0340  WBC 5.2 4.4  HGB 11.8* 12.5*  HCT 35.7* 38.6*  MCV 79.0* 80.2  PLT 206 536   Basic Metabolic Panel Recent Labs    01/31/22 0421 02/01/22 0340  NA 125* 127*  K 4.0 4.2  CL 86* 87*  CO2 29 28  GLUCOSE 91  74  BUN 29* 30*  CREATININE 1.45* 1.80*  CALCIUM 9.2 9.5  MG 1.9 2.1   Liver Function Tests No results for input(s): AST, ALT, ALKPHOS, BILITOT, PROT, ALBUMIN in the last 72 hours. No results for input(s): LIPASE, AMYLASE in the last 72 hours. Cardiac Enzymes No results for input(s): CKTOTAL, CKMB, CKMBINDEX, TROPONINI in the last 72 hours.  BNP: BNP (last 3 results) Recent Labs    09/30/21 0241 01/12/22 1524 01/23/22 1145  BNP 840.7* 1,341.7* 1,831.8*    ProBNP (last 3 results) Recent Labs    07/04/21 1607 08/16/21 1620  PROBNP 425.0* 601.0*     D-Dimer No results for input(s): DDIMER in the last 72 hours. Hemoglobin A1C No results for input(s): HGBA1C in the last 72 hours.  Fasting Lipid Panel No results for input(s): CHOL, HDL, LDLCALC, TRIG, CHOLHDL, LDLDIRECT in the last 72 hours. Thyroid Function Tests No results for input(s): TSH, T4TOTAL, T3FREE, THYROIDAB in the last 72 hours.  Invalid input(s): FREET3  Other results:   Imaging    CT RENAL STONE STUDY  Result Date: 01/31/2022 CLINICAL DATA:  Flank pain EXAM: CT ABDOMEN AND PELVIS WITHOUT CONTRAST TECHNIQUE: Multidetector CT imaging of the abdomen and pelvis was performed following the standard protocol without IV contrast. RADIATION DOSE REDUCTION: This exam was performed according to the departmental dose-optimization program which includes automated exposure control, adjustment of the mA and/or  kV according to patient size and/or use of iterative reconstruction technique. COMPARISON:  01/17/2022 FINDINGS: Lower chest: Heart is enlarged in size. Coronary artery calcifications are seen. There is interval appearance of small right pleural effusion. There is homogeneous alveolar density with air bronchogram in the medial right lower lobe, possibly pneumonia. There is interval appearance of infiltrates in the posterior subpleural location in the right lower lobe. There is 8 x 5 mm pleural-based nodule in the  right middle lobe. Area of this nodule was not included in the previous study. There is prominence of interstitial markings in the periphery of left lower lung fields suggesting scarring. Hepatobiliary: No focal abnormality is seen in the liver. Gallbladder is not distinctly seen. Pancreas: No focal abnormality is seen. Spleen: Unremarkable Adrenals/Urinary Tract: Adrenals are unremarkable. There is no hydronephrosis. There is possible 2 mm calculus in the lower pole of left kidney. There is 4 mm calcific density in the midportion of right kidney. There are few hyperdense cysts in the kidneys. There are few low-density simple appearing cysts in both kidneys largest measuring 1.9 cm. Stomach/Bowel: Stomach is unremarkable. Small bowel loops are not dilated. Appendix is not dilated. There is no significant wall thickening in colon. Scattered diverticula are seen in colon without signs of focal acute diverticulitis. Vascular/Lymphatic: Extensive arterial calcifications are seen. There are subcentimeter nodes in the retroperitoneum and mesentery. Reproductive: Coarse calcifications are seen in the prostate. Other: There is no ascites or pneumoperitoneum. Musculoskeletal: There is first-degree anterolisthesis at L4-L5 level along with marked disc space narrowing. IMPRESSION: There is no evidence of intestinal obstruction or pneumoperitoneum. There is no hydronephrosis. Appendix is not dilated. There are possible small nonobstructing renal stones. There are simple and hyperdense cysts in the kidneys. Diverticulosis of colon without signs of focal acute diverticulitis. There is interval appearance of small right pleural effusion. There is focal consolidation in the medial right lower lobe suggesting pneumonia. There is interval appearance of increased markings in the posterior right lower lung fields suggesting atelectasis/pneumonia. Follow-up studies until complete clearing occurs should be considered to rule out any  underlying neoplastic process. There is 8 x 5 mm pleural-based nodule in the lateral segment of right middle lobe. Follow-up CT in 6 months may be considered. Other findings as described in the body of the report. Electronically Signed   By: Elmer Picker M.D.   On: 01/31/2022 15:03     Medications:     Scheduled Medications:  (feeding supplement) PROSource Plus  30 mL Oral BID BM   aspirin EC  81 mg Oral q AM   docusate sodium  200 mg Oral BID   famotidine  10 mg Oral Daily   ferrous sulfate  325 mg Oral QODAY   folic acid  1 mg Oral Daily   insulin aspart  0-9 Units Subcutaneous TID WC   mouth rinse  15 mL Mouth Rinse BID   melatonin  6 mg Oral QHS   midodrine  15 mg Oral TID WC   multivitamin with minerals  1 tablet Oral Daily   pantoprazole  40 mg Oral Daily   polyethylene glycol  17 g Oral Daily   potassium chloride  40 mEq Oral Daily   rosuvastatin  5 mg Oral Daily   Selexipag  200 mcg Oral BID   sodium chloride flush  3 mL Intravenous Q12H   spironolactone  25 mg Oral Daily   thiamine  100 mg Oral Daily   Or   thiamine  100  mg Intravenous Daily   tiZANidine  2 mg Oral TID   torsemide  40 mg Oral BID   traZODone  50 mg Oral QHS   Warfarin - Pharmacist Dosing Inpatient   Does not apply q1600    Infusions:  sodium chloride      PRN Medications: sodium chloride, acetaminophen, guaiFENesin-dextromethorphan, LORazepam, methocarbamol, Muscle Rub, ondansetron (ZOFRAN) IV, ondansetron, polyethylene glycol, polyvinyl alcohol, simethicone, sodium chloride flush, traMADol    Patient Profile   76 y/o old male with end-stage PAH/RV failure with chronic hypotension and hyponatremia. Also has CKD 3b, CAD s/p CABG/AVR.   Over past year multiple admits for volume overload and hyponatremia.    Recent had accidental ingestion of fuel injector cleaner. Also hospitalized at [facility].    Now presents with hypotension and volume overload   Assessment/Plan   1.  A/C Biventricular Heart Failure, Predominant RV Failure   - Echo 10/22 EF 45-50% RV low normal  - Marked volume overload. BNP 1831. CXR with pulmonary edema.  - Brisk diuresis with milrinone 0.25 cmg + lasix drip + metolazone.  - Off milrinone due to hypotension - Volume status stable. Continue torsemide at current dose. .  - Renal function trending up 1.45>1.8  (Baseline 1.7-2.0)   2. PAH  -WHO Group I & II - Auto-immune serologies negative (checked twice). - ? Component of HHT/shunt/AVMs with late bubbles on bubble study.  - Echo with bubble study (05/16/18): EF 55-60% with "late bubbles" , Mechanical AVR stable with trivial perivalvular regurg, Mild MR, Severe LAE, Severe RV dilation and reduced function. No PFO. Mod TR, PA peak pressure 68 mm Hg - Echo (4/22): EF 50-55%, RV ok, severe RVSP 61.6 mmHg, severe biatrial enlargement, moderate TR, stable AVR, AV mean gradient 11 mmHg - Echo 10/22 EF 45-50 RV low normal  - Ab u/s with cirrhosis but no evidence of portal HTN. - Off tadalafil and Macitentan w/ hypotension and fluid retention -Continue Uptravi.   3. Chronic A fib  - No longer on bb due to bradycardia.  - INR 2.5  - Coumadin per pharmacy. Discussed dosing with PharmD personally.   4. Hypotension  - in setting of RV failure - continue midodrine 15 tid   5. Hyponatremia  - in setting of end-stage HF  - Sodium 130-->126->125->122 ->123 -> tolvaptan -> 126->128 ->125->127 today  - Restrict fluids    6. PVCs  7. CAD  - s/p CABG 07/2017 with Dr Roderic Palau in [place] - No chest pain.    8. Leukocytoclastic Vasculitis - Followed by Rheumatology    9. Cirrhosis US Abdomen RUQ 05/16/18 - s/p cholecystectomy. + hepatic cirrhosis. Mild ascites.  - Liver Doppler 05/16/18 - No hepatic, splenic, or portal venous thrombosis or occlusion. Mild ascites   9. S/P Mechanical AVR - Coumadin held on admit w/ supratherapeutic INR (4.1) - INR 2.5 today  - Continue coumadin, dosing per pharmacy    10. Hypokalemia/Hypomagnesemia - stable today   56 GOC:  - End-stage RV failure  - Palliative care following - DNR/DNI  - Dr Haroldine Laws  spoke with him and his daughter. Best option for him is Residential Hospice but he is refusing (has multiple animals at home including 140 pound Rottweiler). Has agreed to [facility]. Not sure this will be sufficient support but he is trying to get an aide at home to help him.  12. L Rib Pain  Adding pain medication per primary team.   He continues to decline. I am  not sure he will be able to go home. Ideally he would go to SNF versus Residential Hospice.    Length of Stay: [place], NP  02/01/2022, 9:40 AM  Advanced Heart Failure Team Pager [phone] (M-F; 7a - 5p)  Please contact [facility] for night-coverage after hours (5p -7a ) and weekends on [email or web address]  Patient seen and examined with the above-signed Advanced Practice Provider and/or Housestaff. I personally reviewed laboratory data, imaging studies and relevant notes. I independently examined the patient and formulated the important aspects of the plan. I have edited the note to reflect any of my changes or salient points. I have personally discussed the plan with the patient and/or family.  Very weak. Complaining of pain all over. Denies SOB.   General:  Terminally ill appearing. No resp difficulty HEENT: normal Neck: supple. no JVD. Carotids 2+ bilat; no bruits. No lymphadenopathy or thryomegaly appreciated. Cor: PMI nondisplaced. irregular rate & rhythm. + mechanical s2 Lungs: clear Abdomen: soft, nontender, nondistended. No hepatosplenomegaly. No bruits or masses. Good bowel sounds. Extremities: no cyanosis, clubbing, rash, edema Neuro: alert & orientedx3, cranial nerves grossly intact. moves all 4 extremities w/o difficulty. Affect pleasant  He continues to deteriorate. He is now agreeable to residential Hospice in [place]. Will arrange. Continue current HF meds.  Pain control as needed.   Glori Bickers, MD  5:38 PM

## 2022-02-01 NOTE — Progress Notes (Signed)
ANTICOAGULATION CONSULT NOTE - Follow Up Consult  Pharmacy Consult for Warfarin Indication: mechanical AVR  Allergies  Allergen Reactions   Augmentin [Amoxicillin-Pot Clavulanate] Anaphylaxis and Diarrhea    "Upset stomach"   Chicken Allergy Other (See Comments)   Pantoprazole Sodium Nausea Only    Gets gassy and starting itching like crazy Gets gassy and starting itching like crazy   Valium [Diazepam] Other (See Comments)    HA and Abd pain.   Tape Rash and Other (See Comments)    Surgical tape   Wound Dressing Adhesive Other (See Comments) and Rash    Surgical tape Surgical tape Surgical tape    Vital Signs: Temp: 97.6 F (36.4 C) (02/08 0400) Temp Source: Oral (02/08 0400) BP: 101/77 (02/08 0400) Pulse Rate: 61 (02/08 0400)  Labs: Recent Labs    01/30/22 0155 01/31/22 0421 02/01/22 0340  HGB 12.1* 11.8* 12.5*  HCT 38.4* 35.7* 38.6*  PLT 196 206 227  LABPROT 34.2* 34.4* 27.0*  INR 3.4* 3.4* 2.5*  CREATININE 1.47* 1.45* 1.80*     Estimated Creatinine Clearance: 27.6 mL/min (A) (by C-G formula based on SCr of 1.8 mg/dL (H)).   Medications:  Scheduled:   (feeding supplement) PROSource Plus  30 mL Oral BID BM   aspirin EC  81 mg Oral q AM   docusate sodium  200 mg Oral BID   famotidine  10 mg Oral Daily   ferrous sulfate  325 mg Oral QODAY   folic acid  1 mg Oral Daily   insulin aspart  0-9 Units Subcutaneous TID WC   mouth rinse  15 mL Mouth Rinse BID   melatonin  6 mg Oral QHS   midodrine  15 mg Oral TID WC   multivitamin with minerals  1 tablet Oral Daily   pantoprazole  40 mg Oral Daily   polyethylene glycol  17 g Oral Daily   potassium chloride  40 mEq Oral Daily   rosuvastatin  5 mg Oral Daily   Selexipag  200 mcg Oral BID   sodium chloride flush  3 mL Intravenous Q12H   spironolactone  25 mg Oral Daily   thiamine  100 mg Oral Daily   Or   thiamine  100 mg Intravenous Daily   tiZANidine  2 mg Oral TID   torsemide  40 mg Oral BID   traZODone   50 mg Oral QHS   Warfarin - Pharmacist Dosing Inpatient   Does not apply q1600    Assessment: 76 y.o. male with medical history significant of chronic systolic CHF NYHA class III, HTN, CAD and aortic stenosis status post CABG and mechanical AVR on Coumadin, orthostatic hypotension, COPD and pulmonary hypertension, IIDM, PAF, liver cirrhosis secondary to alcohol abuse, presented with increasing leg swelling.  Home regimen per patient: warfarin 58m MonWed and warfarin 2.532mall other days.   INR now down to 2.5 today after holding warfarin the past two days. Hgb 12, plt 227 - no s/sx of bleeding. Oral intake variable (10-100% documented).   Goal of Therapy:  INR goal 2.5-3.0 (per clinic note) Monitor platelets by anticoagulation protocol: Yes   Plan:  Give low dose warfarin tonight - 73m35mDaily INR, s/s bleeding  Thank you for allowing pharmacy to participate in this patient's care.  FraErin HearingarmD., BCPS Clinical Pharmacist 02/01/2022 12:21 PM

## 2022-02-01 NOTE — Plan of Care (Signed)
Problem: Safety: Goal: Ability to remain free from injury will improve Outcome: Progressing

## 2022-02-01 NOTE — Progress Notes (Signed)
Progress Note   Patient: Lance Hicks CXK:[id number] DOB: [birth year] DOA: 01/23/2022     9 DOS: the patient was seen and examined on 02/01/2022   Brief hospital course: Lance Hicks is a 76 year old male with past medical history significant for chronic systolic congestive heart failure NYHA class III, essential pretension, CAD s/p CABG, aortic stenosis s/p aortic valve replacement on Coumadin, orthostatic hypotension, COPD, pulmonary hypertension, type 2 diabetes mellitus, paroxysmal atrial fibrillation, cirrhosis secondary to EtOH abuse who presented to [facility] ED on 1/30 with progressive lower extremity edema.  Recently hospitalized at [facility] for pneumonia; treated with antibiotics in which his Lasix was held for 3 days for "low blood pressure".  Discharged home and patient started noticed increased swelling of his lower legs and he resumed his Lasix for the past 3 days however with no improvement.  Patient also reporting exertional dyspnea and was noted by his friend to be "very winded" and sent him to the hospital for further evaluation.  Denies cough, no fever/chills.  In the ED, BP was slightly hypotensive, found to be hypoxic and placed on 3 L nasal cannula.  Chest x-ray with bilateral pulmonary congestion.  Cardiology consulted and patient was given 80 mg IV Lasix.  Hospital service consulted for further evaluation management.  Patient was diuresed with furosemide with good response. Continue to be very weak and deconditioned, positive back and abdominal pain.  Patient has agreed to be discharged to residential hospice.    Assessment and Plan Assessment and Plan: Acute on chronic combined systolic and diastolic CHF (congestive heart failure) ([place])- (present on admission) Patient clinically is more euvolemic today, no lower extremity edema and no wheezing, no JVD.  His heart failure is advanced, EF 40 to 50% on LV with global hypokinesis. Decreased RV systolic function  with mild enlarged cavity. Moderate to severe Tricuspid regurgitation.  Patient now off milrinone Continue diuresis with torsemide and spironolactone.  Patient will be discharge to hospice.     [name] No signs of valve dysfunction, continue anticoagulation with warfarin.  INR today is 2.5   Pulmonary arterial hypertension ([place])- (present on admission) Continue supplemental 02 per  and diuresis with torsemide. Keep 02 saturation 88% or greater.   Hypotension, chronic- (present on admission) On midodrine 15 mg p.o. TID  Chronic atrial fibrillation ([place])- (present on admission) Continue rate control and anticoagulation.  On warfarin due to aortic valve replacement.   Acute kidney injury superimposed on chronic kidney disease ([place])- (present on admission) CKD stage 3a/ hyponatremia. Hypomagnesemia  Persistent hyponatremia, clinically patient euvolemic  Na today is 127, with K at 4,2 and Cl 87 wit serum bicarbonate at 28 and cr 1,8   Plan to continue diuresis with torsemide Hyponatremia marker of poor prognosis.   Hyperlipidemia- (present on admission) Continue with statin therapy/   Protein-calorie malnutrition, severe ([place])- (present on admission) Body mass index is 18.26 kg/m.  Nutrition Status: Nutrition Problem: Increased nutrient needs Etiology: chronic illness (CHF) Signs/Symptoms: estimated needs Interventions: MVI, Prostat, Liberalize Diet, Refer to RD note for recommendations Dietitian follow during hospital course, continue to encourage increased oral/protein intake, supplementation  Abdominal pain CT renal with renal stones but not obstructive uropathy No specific pain, continue pain control with morphine and oxycodone. Patient very weak and debilitated. Plan to transfer to residential hospice to continue care.   Alcohol abuse- (present on admission) Counseled on need for cessation.   Iron deficiency anemia- (present on  admission) Anemia panel with  iron 164, TIBC 360, ferritin 38, folate 29.1, B12 2080.  Continue ferrous sulfate 325 mg p.o. every other day  GERD (gastroesophageal reflux disease)- (present on admission) Continue home Pepcid  Physical deconditioning- (present on admission) Patient with significant weakness, debility, gait disturbance and deconditioning from recurrent hospitalization.  PT/OT currently recommends SNF placement, patient declines and states has many friends including the [employer] community will assist him when he returns home.  Discharging with home health services, although recommended SNF but he declined them multiple attempts during hospitalization.  Overall poor prognosis with high bounce back potential given his terminal illness with recommendation of hospice care.  Patient has agreed to be transfer to hospice when bed available.   Anemia Anemia of chronic disease,   Hypokalemia, Hypomagnesmia-resolved as of 02/01/2022 Repleted during hospitalization.  Continue potassium supplementation 40 mg p.o. daily on discharge.        Subjective: Patient continue to have chest pain and abdominal pain, intermittent and can be very severe in intensity. No nausea or vomiting, no dyspnea.   Objective BP 108/66 (BP Location: Left Arm)    Pulse (!) 55    Temp 97.8 F (36.6 C) (Oral)    Resp 17    Ht 5' 9" (1.753 m)    Wt 54.1 kg    SpO2 94%    BMI 17.61 kg/m   Neurology awake and alert  ENT positive pallor  Cardiovascular heart with S1 and S2 present and rhythmic, no gallops or rubs  No jvd  No lower extremity edema Pulmonary  with scattered rales but no wheezing, no rhonchi  Abdominal soft and non tender Skin Musculoskeletal   Data Reviewed:    Family Communication: no family at the bedside   Disposition: Status is: Inpatient  Remains inpatient appropriate because: pending transfer to residential hospice.          Author: Tawni Millers, MD 02/01/2022  11:44 AM  For on call review [email or web address].

## 2022-02-01 NOTE — Assessment & Plan Note (Signed)
Anemia of chronic disease,

## 2022-02-02 DIAGNOSIS — I482 Chronic atrial fibrillation, unspecified: Secondary | ICD-10-CM | POA: Diagnosis not present

## 2022-02-02 DIAGNOSIS — I5043 Acute on chronic combined systolic (congestive) and diastolic (congestive) heart failure: Secondary | ICD-10-CM | POA: Diagnosis not present

## 2022-02-02 DIAGNOSIS — E871 Hypo-osmolality and hyponatremia: Secondary | ICD-10-CM | POA: Diagnosis not present

## 2022-02-02 LAB — CBC
HCT: 40.7 % (ref 39.0–52.0)
Hemoglobin: 12.9 g/dL — ABNORMAL LOW (ref 13.0–17.0)
MCH: 25.5 pg — ABNORMAL LOW (ref 26.0–34.0)
MCHC: 31.7 g/dL (ref 30.0–36.0)
MCV: 80.4 fL (ref 80.0–100.0)
Platelets: 220 10*3/uL (ref 150–400)
RBC: 5.06 MIL/uL (ref 4.22–5.81)
RDW: 21.8 % — ABNORMAL HIGH (ref 11.5–15.5)
WBC: 6.6 10*3/uL (ref 4.0–10.5)
nRBC: 0 % (ref 0.0–0.2)

## 2022-02-02 LAB — PROTIME-INR
INR: 1.9 — ABNORMAL HIGH (ref 0.8–1.2)
Prothrombin Time: 22.1 seconds — ABNORMAL HIGH (ref 11.4–15.2)

## 2022-02-02 LAB — COOXEMETRY PANEL
Carboxyhemoglobin: 1.6 % — ABNORMAL HIGH (ref 0.5–1.5)
Methemoglobin: 0.9 % (ref 0.0–1.5)
O2 Saturation: 33.8 %
Total hemoglobin: 13.1 g/dL (ref 12.0–16.0)

## 2022-02-02 LAB — BASIC METABOLIC PANEL
Anion gap: 12 (ref 5–15)
BUN: 31 mg/dL — ABNORMAL HIGH (ref 8–23)
CO2: 29 mmol/L (ref 22–32)
Calcium: 9.4 mg/dL (ref 8.9–10.3)
Chloride: 88 mmol/L — ABNORMAL LOW (ref 98–111)
Creatinine, Ser: 1.47 mg/dL — ABNORMAL HIGH (ref 0.61–1.24)
GFR, Estimated: 50 mL/min — ABNORMAL LOW (ref >60)
Glucose, Bld: 98 mg/dL (ref 70–99)
Potassium: 3.8 mmol/L (ref 3.5–5.1)
Sodium: 129 mmol/L — ABNORMAL LOW (ref 135–145)

## 2022-02-02 MED ORDER — WARFARIN SODIUM 5 MG PO TABS
5.0000 mg | ORAL_TABLET | Freq: Once | ORAL | Status: AC
  Administered 2022-02-02: 5 mg via ORAL
  Filled 2022-02-02: qty 1

## 2022-02-02 NOTE — Progress Notes (Signed)
PT Cancellation Note  Patient Details Name: Lance Hicks MRN: [id number] DOB: 07-Apr-1946   Cancelled Treatment:    Reason Eval/Treat Not Completed: Patient declined, no reason specified, pt reports he is unable to participate in therapy session at this time, reports he would like to be seen after 2PM. Will attempt to circle back at that time as time/schedule allows.   [name], PT, DPT   Acute Rehabilitation Department Pager #: [phone]   [name] 02/02/2022, 11:25 AM

## 2022-02-02 NOTE — Progress Notes (Addendum)
Advanced Heart Failure Rounding Note  PCP-Cardiologist: None   Subjective:   1/31 Started on milrinone 0.25 mcg + lasix 20 mg+ metolazone.  2/2 Milrinone stopped due to hypotension.   2/7 CT Renal Stone-- no obstruction, no hydronephrosis. Small nonobstructing renal stones. KU was negative.   Feeling a little better. [facility].     Objective:   Weight Range: 51.4 kg Body mass index is 16.73 kg/m.   Vital Signs:   Temp:  [97.8 F (36.6 C)-98.1 F (36.7 C)] 98 F (36.7 C) (02/09 0450) Pulse Rate:  [50-62] 50 (02/09 0450) Resp:  [17-20] 20 (02/09 0450) BP: (104-119)/(65-93) 104/65 (02/09 0450) SpO2:  [90 %-94 %] 92 % (02/09 0450) Weight:  [51.4 kg] 51.4 kg (02/09 0450) Last BM Date: 01/31/22  Weight change: Filed Weights   01/31/22 0412 02/01/22 0358 02/02/22 0450  Weight: 53.7 kg 54.1 kg 51.4 kg    Intake/Output:   Intake/Output Summary (Last 24 hours) at 02/02/2022 1004 Last data filed at 02/02/2022 0841 Gross per 24 hour  Intake 720 ml  Output 1550 ml  Net -830 ml      Physical Exam  General:  Thin Frail.  No resp difficulty HEENT: normal Neck: supple. JVP 7-8 . Carotids 2+ bilat; no bruits. No lymphadenopathy or thryomegaly appreciated. Cor: PMI nondisplaced. Irregular rate & rhythm. No rubs, gallops or murmurs. Lungs: clear Abdomen: soft, nontender, nondistended. No hepatosplenomegaly. No bruits or masses. Good bowel sounds. Extremities: no cyanosis, clubbing, rash, edema Neuro: alert & orientedx3, cranial nerves grossly intact. moves all 4 extremities w/o difficulty. Affect pleasant  Telemetry   Afib 40-60s with occasional PVCs.    Labs    CBC Recent Labs    02/01/22 0340 02/02/22 0241  WBC 4.4 6.6  HGB 12.5* 12.9*  HCT 38.6* 40.7  MCV 80.2 80.4  PLT 227 076   Basic Metabolic Panel Recent Labs    01/31/22 0421 02/01/22 0340  NA 125* 127*  K 4.0 4.2  CL 86* 87*  CO2 29 28  GLUCOSE 91 74  BUN  29* 30*  CREATININE 1.45* 1.80*  CALCIUM 9.2 9.5  MG 1.9 2.1   Liver Function Tests No results for input(s): AST, ALT, ALKPHOS, BILITOT, PROT, ALBUMIN in the last 72 hours. No results for input(s): LIPASE, AMYLASE in the last 72 hours. Cardiac Enzymes No results for input(s): CKTOTAL, CKMB, CKMBINDEX, TROPONINI in the last 72 hours.  BNP: BNP (last 3 results) Recent Labs    09/30/21 0241 01/12/22 1524 01/23/22 1145  BNP 840.7* 1,341.7* 1,831.8*    ProBNP (last 3 results) Recent Labs    07/04/21 1607 08/16/21 1620  PROBNP 425.0* 601.0*     D-Dimer No results for input(s): DDIMER in the last 72 hours. Hemoglobin A1C No results for input(s): HGBA1C in the last 72 hours.  Fasting Lipid Panel No results for input(s): CHOL, HDL, LDLCALC, TRIG, CHOLHDL, LDLDIRECT in the last 72 hours. Thyroid Function Tests No results for input(s): TSH, T4TOTAL, T3FREE, THYROIDAB in the last 72 hours.  Invalid input(s): FREET3  Other results:   Imaging    No results found.   Medications:     Scheduled Medications:  (feeding supplement) PROSource Plus  30 mL Oral BID BM   aspirin EC  81 mg Oral q AM   docusate sodium  200 mg Oral BID   famotidine  10 mg Oral Daily   ferrous sulfate  325 mg Oral QODAY  folic acid  1 mg Oral Daily   mouth rinse  15 mL Mouth Rinse BID   melatonin  6 mg Oral QHS   midodrine  15 mg Oral TID WC   multivitamin with minerals  1 tablet Oral Daily   pantoprazole  40 mg Oral Daily   polyethylene glycol  17 g Oral Daily   rosuvastatin  5 mg Oral Daily   Selexipag  200 mcg Oral BID   sodium chloride flush  3 mL Intravenous Q12H   spironolactone  25 mg Oral Daily   thiamine  100 mg Oral Daily   Or   thiamine  100 mg Intravenous Daily   tiZANidine  2 mg Oral TID   torsemide  40 mg Oral BID   traZODone  50 mg Oral QHS   warfarin  1 mg Oral ONCE-1600   Warfarin - Pharmacist Dosing Inpatient   Does not apply q1600    Infusions:  sodium  chloride      PRN Medications: sodium chloride, acetaminophen, guaiFENesin-dextromethorphan, LORazepam, methocarbamol, morphine injection, Muscle Rub, ondansetron (ZOFRAN) IV, ondansetron, oxyCODONE, polyethylene glycol, polyvinyl alcohol, simethicone, sodium chloride flush    Patient Profile   76 y/o old male with end-stage PAH/RV failure with chronic hypotension and hyponatremia. Also has CKD 3b, CAD s/p CABG/AVR.   Over past year multiple admits for volume overload and hyponatremia.    Recent had accidental ingestion of fuel injector cleaner. Also hospitalized at [facility].    Now presents with hypotension and volume overload   Assessment/Plan   1. A/C Biventricular Heart Failure, Predominant RV Failure   - Echo 10/22 EF 45-50% RV low normal  - Marked volume overload. BNP 1831. CXR with pulmonary edema.  - Brisk diuresis with milrinone 0.25 cmg + lasix drip + metolazone.  - Off milrinone due to hypotension. Cancel CO-OX . No PICC line.  - Volume status stable. Continue torsemide at current dose. .  - Check BMET    2. PAH  -WHO Group I & II - Auto-immune serologies negative (checked twice). - ? Component of HHT/shunt/AVMs with late bubbles on bubble study.  - Echo with bubble study (05/16/18): EF 55-60% with "late bubbles" , Mechanical AVR stable with trivial perivalvular regurg, Mild MR, Severe LAE, Severe RV dilation and reduced function. No PFO. Mod TR, PA peak pressure 68 mm Hg - Echo (4/22): EF 50-55%, RV ok, severe RVSP 61.6 mmHg, severe biatrial enlargement, moderate TR, stable AVR, AV mean gradient 11 mmHg - Echo 10/22 EF 45-50 RV low normal  - Ab u/s with cirrhosis but no evidence of portal HTN. - Off tadalafil and Macitentan w/ hypotension and fluid retention -Continue Uptravi.   3. Chronic A fib  - No longer on bb due to bradycardia.  - Coumadin per pharmacy. Discussed dosing with PharmD personally.   4. Hypotension  - in setting of RV failure -  continue midodrine 15 tid   5. Hyponatremia  - in setting of end-stage HF  - Sodium 130-->126->125->122 ->123 -> tolvaptan -> 126->128 ->125->127->BMET pending.   - Restrict fluids    6. PVCs  7. CAD  - s/p CABG 07/2017 with Dr Roderic Palau in [place] - No chest pain.    8. Leukocytoclastic Vasculitis - Followed by Rheumatology    9. Cirrhosis US Abdomen RUQ 05/16/18 - s/p cholecystectomy. + hepatic cirrhosis. Mild ascites.  - Liver Doppler 05/16/18 - No hepatic, splenic, or portal venous thrombosis or occlusion. Mild ascites   9. S/P Mechanical  AVR - Coumadin held on admit w/ supratherapeutic INR (4.1) - INR 1.9. Discussed with pharmacy.   10. Hypokalemia/Hypomagnesemia Chec BMET now.   40 GOC:  - End-stage RV failure  - Palliative care following - DNR/DNI  - Dr Haroldine Laws  spoke with him and his daughter. Best option for him is [facility] but he is refusing (has multiple animals at home including 140 pound Rottweiler).  Lance he does not want [facility].   12. L Rib Pain  Adding pain medication per primary team.   He continues to decline. Refuses [facility]. Needs SNF.    Length of Stay: [place], NP  02/02/2022, 10:04 AM  Advanced Heart Failure Team Pager [phone] (M-F; 7a - 5p)  Please contact [facility] for night-coverage after hours (5p -7a ) and weekends on [email or web address]  Patient seen and examined with the above-signed Advanced Practice Provider and/or Housestaff. I personally reviewed laboratory data, imaging studies and relevant notes. I independently examined the patient and formulated the important aspects of the plan. I have edited the note to reflect any of my changes or salient points. I have personally discussed the plan with the patient and/or family.  Says he feels a bit better. Working with stretch bands to try to get stronger. Unable to stand. Denies SOB. Continue with pain "all over".   General:  terminally ill-appearing. No  resp difficulty HEENT: normal Neck: supple. JVP 6-7 Cor: Irregular rate & rhythm. Mechanical s2 Lungs: clear Abdomen: soft, nontender, nondistended. No hepatosplenomegaly. No bruits or masses. Good bowel sounds. Extremities: no cyanosis, clubbing, rash, edema Neuro: alert & orientedx3, cranial nerves grossly intact. moves all 4 extremities w/o difficulty. Affect pleasant  Continues to deteriorate. I talked with him again at length about need for [facility] but he only wants to go someplace where they will work with him to get stronger. Now says he will go to SNF. D/w SW.& TRH. Hoepfully we can find a place for him soon.   Total time spent 35 minutes. Over half that time spent discussing above.   Glori Bickers, MD  10:44 PM

## 2022-02-02 NOTE — Progress Notes (Signed)
ANTICOAGULATION CONSULT NOTE - Follow Up Consult  Pharmacy Consult for Warfarin Indication: mechanical AVR  Allergies  Allergen Reactions   Augmentin [Amoxicillin-Pot Clavulanate] Anaphylaxis and Diarrhea    "Upset stomach"   Chicken Allergy Other (See Comments)   Pantoprazole Sodium Nausea Only    Gets gassy and starting itching like crazy Gets gassy and starting itching like crazy   Valium [Diazepam] Other (See Comments)    HA and Abd pain.   Tape Rash and Other (See Comments)    Surgical tape   Wound Dressing Adhesive Other (See Comments) and Rash    Surgical tape Surgical tape Surgical tape    Vital Signs: Temp: 98 F (36.7 C) (02/09 0450) Temp Source: Oral (02/09 0450) BP: 104/65 (02/09 0450) Pulse Rate: 50 (02/09 0450)  Labs: Recent Labs    01/31/22 0421 02/01/22 0340 02/02/22 0241  HGB 11.8* 12.5* 12.9*  HCT 35.7* 38.6* 40.7  PLT 206 227 220  LABPROT 34.4* 27.0* 22.1*  INR 3.4* 2.5* 1.9*  CREATININE 1.45* 1.80*  --      Estimated Creatinine Clearance: 26.2 mL/min (A) (by C-G formula based on SCr of 1.8 mg/dL (H)).   Medications:  Scheduled:   (feeding supplement) PROSource Plus  30 mL Oral BID BM   aspirin EC  81 mg Oral q AM   docusate sodium  200 mg Oral BID   famotidine  10 mg Oral Daily   ferrous sulfate  325 mg Oral QODAY   folic acid  1 mg Oral Daily   mouth rinse  15 mL Mouth Rinse BID   melatonin  6 mg Oral QHS   midodrine  15 mg Oral TID WC   multivitamin with minerals  1 tablet Oral Daily   pantoprazole  40 mg Oral Daily   polyethylene glycol  17 g Oral Daily   rosuvastatin  5 mg Oral Daily   Selexipag  200 mcg Oral BID   sodium chloride flush  3 mL Intravenous Q12H   spironolactone  25 mg Oral Daily   thiamine  100 mg Oral Daily   Or   thiamine  100 mg Intravenous Daily   tiZANidine  2 mg Oral TID   torsemide  40 mg Oral BID   traZODone  50 mg Oral QHS   warfarin  1 mg Oral ONCE-1600   Warfarin - Pharmacist Dosing Inpatient    Does not apply q1600    Assessment: 76 y.o. male with medical history significant of chronic systolic CHF NYHA class III, HTN, CAD and aortic stenosis status post CABG and mechanical AVR on Coumadin, orthostatic hypotension, COPD and pulmonary hypertension, IIDM, PAF, liver cirrhosis secondary to alcohol abuse, presented with increasing leg swelling.  Home regimen per patient: warfarin 12m MonWed and warfarin 2.520mall other days.   INR now down to 1.9 today after holding warfarin. Hgb 12s, no s/sx of bleeding. Missed dose last night for unknown reason.   Goal of Therapy:  INR goal 2.5-3.0 (per clinic note) Monitor platelets by anticoagulation protocol: Yes   Plan:  Give 87m48monight Daily INR, s/s bleeding  Thank you for allowing pharmacy to participate in this patient's care.  FraErin HearingarmD., BCPS Clinical Pharmacist 02/02/2022 10:07 AM

## 2022-02-02 NOTE — Care Management Important Message (Signed)
Important Message  Patient Details  Name: Lance [facility] MRN: [id number] Date of Birth: 12-25-1946   Medicare Important Message Given:  Yes     Shelda Altes 02/02/2022, 9:18 AM

## 2022-02-02 NOTE — Progress Notes (Signed)
Progress Note   Patient: Lance [facility] ZOX:[id number] DOB: 04-02-1946 DOA: 01/23/2022     10 DOS: the patient was seen and examined on 02/02/2022   Brief hospital course: Lance Hicks is a 76 year old male with past medical history significant for chronic systolic congestive heart failure NYHA class III, essential pretension, CAD s/p CABG, aortic stenosis s/p aortic valve replacement on Coumadin, orthostatic hypotension, COPD, pulmonary hypertension, type 2 diabetes mellitus, paroxysmal atrial fibrillation, cirrhosis secondary to EtOH abuse who [facility] ED on 1/30 with progressive lower extremity edema.  Recently hospitalized at [facility] for pneumonia; treated with antibiotics in which his Lasix was held for 3 days for "low blood pressure".  Discharged home and patient started noticed increased swelling of his lower legs and he resumed his Lasix for the past 3 days however with no improvement.  Patient also reporting exertional dyspnea and was noted by his friend to be "very winded" and sent him to the hospital for further evaluation.  Denies cough, no fever/chills.  In the ED, BP was slightly hypotensive, found to be hypoxic and placed on 3 L nasal cannula.  Chest x-ray with bilateral pulmonary congestion.  Cardiology consulted and patient was given 80 mg IV Lasix.  Hospital service consulted for further evaluation management.  Patient was diuresed with furosemide with good response. Continue to be very weak and deconditioned, positive back and abdominal pain.  Patient has agreed to be discharged to residential hospice for pain control.    Assessment and Plan: Acute on chronic combined systolic and diastolic CHF (congestive heart failure) ([place])- (present on admission)  EF 40 to 50% on LV with global hypokinesis. Decreased RV systolic function with mild enlarged cavity. Moderate to severe Tricuspid regurgitation. Off milrinone   Blood pressure is 112/55  Urine output  1,800 over last 24 hrs  Improved volume status.  Continue with torsemide and spironolactone.     Pulmonary arterial hypertension ([place])- (present on admission) His dyspnea is stable, his oxygenation is 93% on room air at rest.  Continue with Selexipag  He has limited mobility due to dyspnea.  Poor prognosis.   [name] No signs of valve dysfunction, per echocardiogram.   Continue anticoagulation with warfarin.   Hypotension, chronic- (present on admission) Continue with midodrine 15 mg p.o. TID  Chronic atrial fibrillation ([place])- (present on admission) Continue anticoagulation with warfarin, currently is rate controlled.   Acute kidney injury superimposed on chronic kidney disease ([place])- (present on admission) CKD stage 3a/ hyponatremia. Hypomagnesemia  Renal function with serum cr at 1,47 with K at 3,8 and Na at 129 with serum bicarbonate at 29.   Continue to target negative fluid balance with torsemide and spironolactone.    Hyperlipidemia- (present on admission) On statin therapy   Protein-calorie malnutrition, severe ([place])- (present on admission) Body mass index is 18.26 kg/m.  Nutrition Status: Nutrition Problem: Increased nutrient needs Etiology: chronic illness (CHF) Signs/Symptoms: estimated needs Interventions: MVI, Prostat, Liberalize Diet, Refer to RD note for recommendations  Continue with  Nutritional supplements, patient is very weak and deconditioned.   Abdominal pain CT renal with renal stones but not obstructive uropathy No specific pain, continue pain control with morphine and oxycodone.  Pain better controlled but continue to require high frequency dosing.   Alcohol abuse- (present on admission) No signs of acute withdrawal.  Continue neuro checks per unit protocol.    Iron deficiency anemia- (present on admission) Anemia panel with iron 164, TIBC 360, ferritin  38, folate 29.1, B12 2080.    Continue iron  supplementation   GERD (gastroesophageal reflux disease)- (present on admission) Continue antiacid therapy., patient is tolerating po well.   Physical deconditioning- (present on admission) Patient with significant weakness, debility, gait disturbance and deconditioning from recurrent hospitalization.  PT/OT currently recommends SNF placement, patient declines and states has many friends including the [employer] community will assist him when he returns home.  Discharging with home health services, although recommended SNF but he declined them multiple attempts during hospitalization.  Overall poor prognosis with high bounce back potential given his terminal illness with recommendation of hospice care.  Patient has agreed to be transfer to hospice when bed available.  Patient with persistent chest pain that is improved with analgesics, but continue to require high frequency dosing.   Anemia Anemia of chronic disease,   Hypokalemia, Hypomagnesmia-resolved as of 02/01/2022 Repleted during hospitalization.  Continue potassium supplementation 40 mg p.o. daily on discharge.      Subjective: Patient continue to have pain at his back and chest improved with analgesics but needs frequent dosing. No nausea or vomiting, continue to be very weak and deconditioned   Physical Exam: Vitals:   02/01/22 1037 02/01/22 1921 02/02/22 0450 02/02/22 1435  BP: 108/66 (!) 119/93 104/65 (!) 112/55  Pulse: (!) 55 62 (!) 50   Resp: _0 Temp: 97.8 F (36.6 C) 98.1 F (36.7 C) 98 F (36.7 C)   TempSrc: Oral Oral Oral   SpO2: 94% 90% 92% 93%  Weight:   51.4 kg   Height:       Neurology patient is awake and alert ENT mild pallor Cardiovascular with S1 and S2 present and rhythmic, no gallop  No JVD No lower extremity edema Respiratory with no wheezing or rhonchi Abdomen soft and non tender  Data Reviewed:  Reviewed   Family Communication: no family at the bedside   Disposition: Status is:  Inpatient  Remains inpatient appropriate because: pending transfer to hospice for pain control     Planned Discharge Destination:  residential hospice       Author: Tawni Millers, MD 02/02/2022 3:27 PM  For on call review [email or web address].

## 2022-02-02 NOTE — TOC Progression Note (Signed)
Transition of Care [facility]) - Progression Note    Patient Details  Name: Lance [facility] MRN: [id number] Date of Birth: August 13, 1946  Transition of Care [facility]) CM/SW Contact  Ahtziry Saathoff, LCSW Phone Number: 02/02/2022, 4:53 PM  Clinical Narrative:    HF CSW attempted to speak with Mr. Hiney at bedside about the plan for discharge and Mr. Misener reported that he is tired of repeating himself but that he plans to get stronger and live for a very long time. Mr. Fonder reported he will go to hospice but ultimately wants to go home.  CSW will continue to follow throughout discharge.   Expected Discharge Plan: [place] Barriers to Discharge: Continued Medical Work up  Expected Discharge Plan and Services Expected Discharge Plan: [place] In-house Referral: Clinical Social Work Discharge Planning Services: CM Consult Post Acute Care Choice: [place] arrangements for the past 2 months: Single Family Home Expected Discharge Date: 01/31/22                         HH Arranged: RN, PT [place] Agency: [place] ([place]) Date [place]: 01/26/22 Time [place]: 1559 Representative spoke with at [place]: [place] ([place]) Interventions Food Insecurity Interventions: Intervention Not Indicated Financial Strain Interventions: Intervention Not Indicated Housing Interventions: Intervention Not Indicated  Readmission Risk Interventions No flowsheet data found.  Tanis Burnley, MSW, [place] Heart Failure Social Worker

## 2022-02-03 DIAGNOSIS — I5082 Biventricular heart failure: Secondary | ICD-10-CM

## 2022-02-03 LAB — BASIC METABOLIC PANEL
Anion gap: 12 (ref 5–15)
BUN: 31 mg/dL — ABNORMAL HIGH (ref 8–23)
CO2: 29 mmol/L (ref 22–32)
Calcium: 9.8 mg/dL (ref 8.9–10.3)
Chloride: 87 mmol/L — ABNORMAL LOW (ref 98–111)
Creatinine, Ser: 1.47 mg/dL — ABNORMAL HIGH (ref 0.61–1.24)
GFR, Estimated: 50 mL/min — ABNORMAL LOW (ref >60)
Glucose, Bld: 105 mg/dL — ABNORMAL HIGH (ref 70–99)
Potassium: 3.6 mmol/L (ref 3.5–5.1)
Sodium: 128 mmol/L — ABNORMAL LOW (ref 135–145)

## 2022-02-03 LAB — PROTIME-INR
INR: 1.6 — ABNORMAL HIGH (ref 0.8–1.2)
Prothrombin Time: 19.3 seconds — ABNORMAL HIGH (ref 11.4–15.2)

## 2022-02-03 LAB — MAGNESIUM: Magnesium: 2.1 mg/dL (ref 1.7–2.4)

## 2022-02-03 MED ORDER — POTASSIUM CHLORIDE CRYS ER 20 MEQ PO TBCR
40.0000 meq | EXTENDED_RELEASE_TABLET | Freq: Once | ORAL | Status: AC
  Administered 2022-02-03: 40 meq via ORAL
  Filled 2022-02-03: qty 2

## 2022-02-03 MED ORDER — WARFARIN SODIUM 7.5 MG PO TABS
7.5000 mg | ORAL_TABLET | Freq: Once | ORAL | Status: AC
  Administered 2022-02-03: 7.5 mg via ORAL
  Filled 2022-02-03: qty 1

## 2022-02-03 MED ORDER — ENSURE ENLIVE PO LIQD
237.0000 mL | Freq: Two times a day (BID) | ORAL | Status: DC
  Administered 2022-02-03 – 2022-02-05 (×4): 237 mL via ORAL

## 2022-02-03 MED ORDER — TORSEMIDE 20 MG PO TABS
40.0000 mg | ORAL_TABLET | Freq: Every day | ORAL | Status: DC
  Administered 2022-02-04: 40 mg via ORAL
  Filled 2022-02-03: qty 2

## 2022-02-03 NOTE — Progress Notes (Signed)
Progress Note   Patient: Lance Hicks [id number]:[id number] DOB: 12/31/1945 DOA: 01/23/2022     11 DOS: the patient was seen and examined on 02/03/2022   Brief hospital course: Lance Hicks is a 76 year old male with past medical history significant for chronic systolic congestive heart failure NYHA class III, essential pretension, CAD s/p CABG, aortic stenosis s/p aortic valve replacement on Coumadin, orthostatic hypotension, COPD, pulmonary hypertension, type 2 diabetes mellitus, paroxysmal atrial fibrillation, cirrhosis secondary to EtOH abuse who presented to [facility] ED on 1/30 with progressive lower extremity edema.  Recently hospitalized at [facility] for pneumonia; treated with antibiotics in which his Lasix was held for 3 days for "low blood pressure".  Discharged home and patient started noticed increased swelling of his lower legs and he resumed his Lasix for the past 3 days however with no improvement.  Patient also reporting exertional dyspnea and was noted by his friend to be "very winded" and sent him to the hospital for further evaluation.  Denies cough, no fever/chills.  In the ED, BP was slightly hypotensive, found to be hypoxic and placed on 3 L nasal cannula.  Chest x-ray with bilateral pulmonary congestion.  Cardiology consulted and patient was given 80 mg IV Lasix.  Hospital service consulted for further evaluation management.  Patient was diuresed with furosemide with good response. Continue to be very weak and deconditioned, positive back and abdominal pain.   Patient very weak and deconditioned, pulmonary hypertension with acute on chronic core pulmonale.   Plan to transfer to SNF when bed available.    Assessment and Plan: Acute on chronic combined systolic and diastolic CHF (congestive heart failure) ([place])- (present on admission)  EF 40 to 50% on LV with global hypokinesis. Decreased RV systolic function with mild enlarged cavity. Moderate to severe Tricuspid  regurgitation. Off milrinone   Patient continue to be euvolemic at this point in time. His urine output over last 24 hrs is 1,250 ml and his blood has decreased to 97 mmHg systolic.   Plan to decrease torsemide to 40 mg daily and continue with spironolactone.  Patient not on B blockade or RASS inhibition due to risk of worsening hypotension.     Pulmonary arterial hypertension ([place])- (present on admission) Patient very weak and deconditioned with poor respiratory reserve.  His oxygenation is 94% on room air.   Plan to continue with Selexipag and as needed supplemental 02.   [name] No signs of valve dysfunction, per echocardiogram.   Anticoagulation with warfarin.  INR today is 1,6, continue with dosing per pharmacy protocol.   Hypotension, chronic- (present on admission) On midodrine 15 mg p.o. TID  Chronic atrial fibrillation ([place])- (present on admission) Continue anticoagulation with warfarin, currently is rate controlled.  Patient not on AV blockade at this point in time.   Acute kidney injury superimposed on chronic kidney disease ([place])- (present on admission) CKD stage 3a/ hyponatremia. Hypomagnesemia  His renal function has been stable with serum cr at 1,47 with K at 3,6 and serum bicarbonate at 29. Na continue to be low at 128.   Plan to decrease torsemide to 40 mg daily Continue Kcl for hypokalemia, 40 meq today.  Unrestrict Na, continue with protein supplements.   Hyperlipidemia- (present on admission) Continue with statin therapy   Protein-calorie malnutrition, severe ([place])- (present on admission) Body mass index is 18.26 kg/m.  Nutrition Status: Nutrition Problem: Increased nutrient needs Etiology: chronic illness (CHF) Signs/Symptoms: estimated needs Interventions: MVI, Prostat, Liberalize Diet,  Refer to RD note for recommendations  Continue with  Nutritional supplements, patient is very weak and deconditioned.    Abdominal pain CT renal with renal stones but not obstructive uropathy  Pain better controlled, continue with oral oxycodone and IV morphine as needed.   Alcohol abuse- (present on admission) No signs of acute withdrawal.  Continue neuro checks per unit protocol.    Iron deficiency anemia- (present on admission) Anemia panel with iron 164, TIBC 360, ferritin 38, folate 29.1, B12 2080.    Continue iron supplementation   GERD (gastroesophageal reflux disease)- (present on admission) Continue antiacid therapy., patient is tolerating po well.   Physical deconditioning- (present on admission) Patient with significant weakness, debility, gait disturbance and deconditioning from recurrent hospitalization.  PT/OT currently recommends SNF.  Patient has agreed to be transferred to SNF where he can continue getting physical therapy and medical therapy for his heart failure.    Anemia Anemia of chronic disease,   Hypokalemia, Hypomagnesmia-resolved as of 02/01/2022 Repleted during hospitalization.  Continue potassium supplementation 40 mg p.o. daily on discharge.      Subjective: Patient continue to be very weak and deconditioned. Pain is better controlled with analgesics. No nausea or vomiting.   Physical Exam: Vitals:   02/02/22 2100 02/03/22 0300 02/03/22 0435 02/03/22 0850  BP:   99/70 97/60  Pulse:   (!) 55 (!) 59  Resp:    17  Temp:   98.4 F (36.9 C) 97.8 F (36.6 C)  TempSrc:   Oral Oral  SpO2: 93%  90% 94%  Weight:  52.4 kg    Height:       Neurology awake and alert ENT with mild pallor Cardiovascular with heart S1 and S2 with positive mechanical click S2, no murmurs or gallops.  No JVD No lower extremity edema Respiratory with no wheezing or rhonchi, no rales Abdomen soft and non tender  Data Reviewed:  Noted   Family Communication: I spoke with patient's friends Dawn and Caban at the bedside, we talked in detail about patient's condition, plan of care  and prognosis and all questions were addressed. Arrie Aran [phone] Corene Cornea [phone]  Disposition: Status is: Inpatient  Remains inpatient appropriate because: pending transfer to SNF     Planned Discharge Destination: Skilled nursing facility     Author: Tawni Millers, MD 02/03/2022 11:53 AM  For on call review [email or web address].

## 2022-02-03 NOTE — TOC Initial Note (Signed)
Transition of Care [facility]) - Initial/Assessment Note    Patient Details  Name: Lance Hicks MRN: [id number] Date of Birth: 02-08-1946  [facility] CM/SW Contact:    Tresa Endo Phone Number: 02/03/2022, 4:27 PM  Clinical Narrative:                 CSW received SNF consult. CSW met with pt HPOA via phone, CSW introduced self and explained role at the hospital. Pt reports that PTA the pt lived home alone. Pt has a click score of 16 and as able to walk 22f.   CSW reviewed PT/OT recommendations for SNF. Pt reports being agreeable to SNF. Pt gave CSW permission to fax out to facilities in the area. Pt family has requested [facility], CSW reached out to [facility], there is not bed availability until mid next week. Pt family would like [facility] as next option. CSW contacted [facility], they will follow up and respond on the hub. CSW will continue to follow for DC planning needs.   CSW will continue to follow.    Expected Discharge Plan: Skilled Nursing Facility Barriers to Discharge: Continued Medical Work up   Patient Goals and CMS Choice Patient states their goals for this hospitalization and ongoing recovery are:: Rehab CMS Medicare.gov Compare Post Acute Care list provided to:: Patient Choice offered to / list presented to : Patient  Expected Discharge Plan and Services Expected Discharge Plan: [place] RockIn-house Referral: Clinical Social Work Discharge Planning Services: CM Consult Post Acute Care Choice: SMeadowbrookLiving arrangements for the past 2 months: SStotts CityExpected Discharge Date: 01/31/22                         HH Arranged: RN, PT HArrowhead SpringsAgency: [place]ade[place] Date [place] 01/26/22 Time HElizabeth 1559 Representative spoke with at HHadley CRayArrangements/Services Living arrangements for the past 2 months: SDickinsonLives  with:: Self Patient language and need for interpreter reviewed:: Yes Do you feel safe going back to the place where you live?: Yes      Need for Family Participation in Patient Care: Yes (Comment) Care giver support system in place?: Yes (comment)   Criminal Activity/Legal Involvement Pertinent to Current Situation/Hospitalization: No - Comment as needed  Activities of Daily Living Home Assistive Devices/Equipment: WGilford Rile(specify type) ADL Screening (condition at time of admission) Patient's cognitive ability adequate to safely complete daily activities?: Yes Is the patient deaf or have difficulty hearing?: Yes Does the patient have difficulty seeing, even when wearing glasses/contacts?: No Does the patient have difficulty concentrating, remembering, or making decisions?: No Patient able to express need for assistance with ADLs?: Yes Does the patient have difficulty dressing or bathing?: No Independently performs ADLs?: Yes (appropriate for developmental age) Does the patient have difficulty walking or climbing stairs?: Yes Weakness of Legs: Both Weakness of Arms/Hands: None  Permission Sought/Granted Permission sought to share information with : Facility CSport and exercise psychologist Family Supports Permission granted to share information with : Yes, Verbal Permission Granted  Share Information with NAME: JIllene Bolus(Friend)   [phone] Permission granted to share info w AGENCY: SNF  Permission granted to share info w Relationship: Johnson,Nate (Friend)   [phone]  Permission granted to share info w Contact Information: JIllene Bolus(Friend)   [phone]  Emotional Assessment Appearance:: Appears stated age Attitude/Demeanor/Rapport: Engaged Affect (typically observed): Pleasant  Orientation: : Oriented to Self, Oriented to Place, Oriented to  Time, Oriented to Situation Alcohol / Substance Use: Not Applicable Psych Involvement: No (comment)  Admission diagnosis:   Pulmonary hypertension (HCC) [B30.10] Acute systolic CHF (congestive heart failure) (HCC) [I50.21] Acute on chronic congestive heart failure, unspecified heart failure type ([place]) [I50.9] Patient Active Problem List   Diagnosis Date Noted   Abdominal pain 01/31/2022   Protein-calorie malnutrition, severe ([place]) 01/28/2022   Physical deconditioning 01/25/2022   GERD (gastroesophageal reflux disease) 01/25/2022   Iron deficiency anemia 01/25/2022   Alcohol abuse 01/25/2022   Acute kidney injury superimposed on chronic kidney disease ([place]) 40/45/9136   Acute systolic CHF (congestive heart failure) ([place]) 01/23/2022   Acute on chronic combined systolic and diastolic CHF (congestive heart failure) ([place]) 11/03/2021   ILD (interstitial lung disease) ([place]) 08/04/2021   Fall 06/21/2021   Asthma    Pneumonia due to COVID-19 virus 02/07/2021   Cirrhosis of liver not due to alcohol ([place]) 07/01/2019   Anemia 07/01/2019   Hypotension, chronic 06/06/2018   Enterococcal bacteremia    Pulmonary edema    Strain of deltoid muscle, initial encounter    Pulmonary arterial hypertension ([place]) 02/20/2018   Hypokalemia due to excessive renal loss of potassium 02/18/2018   Left ureteral stone 01/23/2018   Anticoagulated on Coumadin 01/04/2018   Localized edema 01/04/2018   Leukocytoclastic vasculitis ([place]) 10/01/2017   Hypersensitivity angiitis ([place]) 10/01/2017   Maculopapular rash 09/03/2017   Epidermoid cyst of skin 08/24/2017   Bilateral pleural effusion 08/09/2017   Pleural effusion, bilateral 08/09/2017   Supratherapeutic INR 07/26/2017   International normalized ratio (INR) raised 07/26/2017   Microscopic hematuria 06/20/2017   Asymptomatic microscopic hematuria 06/20/2017   Atrial flutter ([place]) 03/29/2017   Coronary artery disease involving native coronary artery of native heart with angina pectoris ([place]) 03/29/2017   H/O maze procedure 03/29/2017   History of coronary artery bypass graft  03/29/2017   Hypertensive heart disease with heart failure ([place]) 03/29/2017   Hyperlipidemia 03/29/2017   Coronary arteriosclerosis in native artery 03/29/2017   Long term (current) use of anticoagulants 03/29/2017   Non-sustained ventricular tachycardia 03/29/2017   Cough 10/17/2016   Pulmonary nodules 06/12/2016   Multiple nodules of lung 06/12/2016   COPD (chronic obstructive pulmonary disease) ([place]) 06/12/2016   Anxiety 05/10/2016   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   Allergic rhinitis 04/28/2016   [name] 03/20/2016   S/P AVR (aortic valve replacement) 03/20/2016   Dyspnea 02/01/2016   Syncope and collapse 02/01/2016   Lumbar radicular pain 01/19/2016   Lumbar radiculopathy 01/19/2016   Backache 12/14/2015   Essential hypertension 12/14/2015   Chronic midline back pain 12/14/2015   Chronic prostatitis 07/23/2015   Nephrolithiasis 07/23/2015   Chronic atrial fibrillation ([place]) 07/23/2015   PCP:  [name], PA-C Pharmacy:   [facility] [place], [address] AT [address] [place] [address] Phone: [phone] Fax: [phone]     Social Determinants of Health (SDOH) Interventions Food Insecurity Interventions: Intervention Not Indicated Financial Strain Interventions: Intervention Not Indicated Housing Interventions: Intervention Not Indicated  Readmission Risk Interventions No flowsheet data found.

## 2022-02-03 NOTE — NC FL2 (Addendum)
[place] LEVEL OF CARE SCREENING TOOL     IDENTIFICATION  Patient Name: Lance Hicks Birthdate: 09-Oct-1946 Sex: male Admission Date (Current Location): 01/23/2022  [facility] and [place] Number:  Herbalist and Address:  [facility] [place], [place] [address] [place], [place] [address]      Provider Number: [id number]  Attending Physician Name and Address:  Tawni Millers,*  Relative Name and Phone Number:  Illene Bolus [facility])   [phone]    Current Level of Care: Hospital Recommended Level of Care: [place] Prior Approval Number:    Date Approved/Denied:   PASRR Number: [id number]  Discharge Plan: SNF    Current Diagnoses: Patient Active Problem List   Diagnosis Date Noted   Abdominal pain 01/31/2022   Protein-calorie malnutrition, severe ([place]) 01/28/2022   Physical deconditioning 01/25/2022   GERD (gastroesophageal reflux disease) 01/25/2022   Iron deficiency anemia 01/25/2022   Alcohol abuse 01/25/2022   Acute kidney injury superimposed on chronic kidney disease ([place]) 45/80/9983   Acute systolic CHF (congestive heart failure) ([place]) 01/23/2022   Acute on chronic combined systolic and diastolic CHF (congestive heart failure) ([place]) 11/03/2021   ILD (interstitial lung disease) ([place]) 08/04/2021   Fall 06/21/2021   Asthma    Pneumonia due to COVID-19 virus 02/07/2021   Cirrhosis of liver not due to alcohol ([place]) 07/01/2019   Anemia 07/01/2019   Hypotension, chronic 06/06/2018   Enterococcal bacteremia    Pulmonary edema    Strain of deltoid muscle, initial encounter    Pulmonary arterial hypertension ([place]) 02/20/2018   Hypokalemia due to excessive renal loss of potassium 02/18/2018   Left ureteral stone 01/23/2018   Anticoagulated on Coumadin 01/04/2018   Localized edema 01/04/2018   Leukocytoclastic vasculitis ([place]) 10/01/2017   Hypersensitivity angiitis ([place]) 10/01/2017   Maculopapular  rash 09/03/2017   Epidermoid cyst of skin 08/24/2017   Bilateral pleural effusion 08/09/2017   Pleural effusion, bilateral 08/09/2017   Supratherapeutic INR 07/26/2017   International normalized ratio (INR) raised 07/26/2017   Microscopic hematuria 06/20/2017   Asymptomatic microscopic hematuria 06/20/2017   Atrial flutter ([place]) 03/29/2017   Coronary artery disease involving native coronary artery of native heart with angina pectoris ([place]) 03/29/2017   H/O maze procedure 03/29/2017   History of coronary artery bypass graft 03/29/2017   Hypertensive heart disease with heart failure ([place]) 03/29/2017   Hyperlipidemia 03/29/2017   Coronary arteriosclerosis in native artery 03/29/2017   Long term (current) use of anticoagulants 03/29/2017   Non-sustained ventricular tachycardia 03/29/2017   Cough 10/17/2016   Pulmonary nodules 06/12/2016   Multiple nodules of lung 06/12/2016   COPD (chronic obstructive pulmonary disease) ([place]) 06/12/2016   Anxiety 05/10/2016   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   Allergic rhinitis 04/28/2016   [name] 03/20/2016   S/P AVR (aortic valve replacement) 03/20/2016   Dyspnea 02/01/2016   Syncope and collapse 02/01/2016   Lumbar radicular pain 01/19/2016   Lumbar radiculopathy 01/19/2016   Backache 12/14/2015   Essential hypertension 12/14/2015   Chronic midline back pain 12/14/2015   Chronic prostatitis 07/23/2015   Nephrolithiasis 07/23/2015   Chronic atrial fibrillation ([place]) 07/23/2015    Orientation RESPIRATION BLADDER Height & Weight     Self, Place, Time  Normal Continent Weight: 115 lb 9.6 oz (52.4 kg) Height:  _0  (175.3 cm)  BEHAVIORAL SYMPTOMS/MOOD NEUROLOGICAL BOWEL NUTRITION STATUS      Continent Diet (See DC summary)  AMBULATORY STATUS COMMUNICATION  OF NEEDS Skin   Limited Assist Verbally Normal                       Personal Care Assistance Level of Assistance  Bathing, Feeding,  Dressing Bathing Assistance: Limited assistance Feeding assistance: Independent Dressing Assistance: Limited assistance     Functional Limitations Info  Sight, Hearing, Speech Sight Info: Impaired Hearing Info: Adequate Speech Info: Adequate    SPECIAL CARE FACTORS FREQUENCY  PT (By licensed PT), OT (By licensed OT)     PT Frequency: 5x a week OT Frequency: 5x a week            Contractures Contractures Info: Not present    Additional Factors Info  Code Status, Allergies Code Status Info: DNR Allergies Info: Augmentin (Amoxicillin-pot Clavulanate)   Chicken Allergy   Pantoprazole Sodium   Valium (Diazepam)   Tape   Wound Dressing Adhesive           Current Medications (02/03/2022):  This is the current hospital active medication list Current Facility-Administered Medications  Medication Dose Route Frequency Provider Last Rate Last Admin   0.9 %  sodium chloride infusion  250 mL Intravenous PRN Wynetta Fines T, MD       acetaminophen (TYLENOL) tablet 650 mg  650 mg Oral Q4H PRN Wynetta Fines T, MD   650 mg at 01/31/22 1600   aspirin EC tablet 81 mg  81 mg Oral q AM Lequita Halt, MD   81 mg at 02/03/22 7026   docusate sodium (COLACE) capsule 200 mg  200 mg Oral BID [place] Eric J, DO   200 mg at 02/02/22 0957   famotidine (PEPCID) tablet 10 mg  10 mg Oral Daily Wynetta Fines T, MD   10 mg at 02/03/22 0857   feeding supplement (ENSURE ENLIVE / ENSURE PLUS) liquid 237 mL  237 mL Oral BID BM Arrien, Jimmy Picket, MD       ferrous sulfate tablet 325 mg  325 mg Oral Tamsen Meek T, MD   325 mg at 37/85/88 5027   folic acid (FOLVITE) tablet 1 mg  1 mg Oral Daily Wynetta Fines T, MD   1 mg at 02/03/22 0858   guaiFENesin-dextromethorphan (ROBITUSSIN DM) 100-10 MG/5ML syrup 5 mL  5 mL Oral Q4H PRN [place] Eric J, DO   5 mL at 02/02/22 1000   MEDLINE mouth rinse  15 mL Mouth Rinse BID [place] Eric J, DO   15 mL at 02/03/22 0900   melatonin tablet 6 mg  6 mg Oral QHS [place] Donnamarie Poag, DO   6 mg at 02/02/22 2141   midodrine (PROAMATINE) tablet 15 mg  15 mg Oral TID WC Clegg, Amy D, NP   15 mg at 02/03/22 7412   morphine (PF) 2 MG/ML injection 1 mg  1 mg Intravenous Q2H PRN Arrien, Jimmy Picket, MD   1 mg at 02/02/22 0420   multivitamin with minerals tablet 1 tablet  1 tablet Oral Daily Wynetta Fines T, MD   1 tablet at 02/03/22 0858   Muscle Rub CREA   Topical PRN Rise Patience, MD   1 application at 87/86/76 2304   ondansetron [facility]) injection 4 mg  4 mg Intravenous Q6H PRN Wynetta Fines T, MD   4 mg at 01/28/22 2057   ondansetron (ZOFRAN) tablet 8 mg  8 mg Oral Q8H PRN Lequita Halt, MD   8 mg at 01/26/22 2118   oxyCODONE (Oxy  IR/ROXICODONE) immediate release tablet 5 mg  5 mg Oral Q4H PRN Arrien, Jimmy Picket, MD   5 mg at 02/03/22 0629   pantoprazole (PROTONIX) EC tablet 40 mg  40 mg Oral Daily Wynetta Fines T, MD   40 mg at 02/03/22 0858   polyethylene glycol (MIRALAX / GLYCOLAX) packet 17 g  17 g Oral Daily Lyda Jester M, PA-C   17 g at 02/02/22 1610   polyethylene glycol (MIRALAX / GLYCOLAX) packet 17 g  17 g Oral Daily PRN [place] Eric J, DO       polyvinyl alcohol (LIQUIFILM TEARS) 1.4 % ophthalmic solution 1 drop  1 drop Both Eyes PRN Chotiner, Yevonne Aline, MD   1 drop at 01/25/22 2156   potassium chloride SA (KLOR-CON M) CR tablet 40 mEq  40 mEq Oral Once Lyndee Leo, RPH       rosuvastatin (CRESTOR) tablet 5 mg  5 mg Oral Daily Wynetta Fines T, MD   5 mg at 02/03/22 0858   Selexipag TABS 200 mcg  200 mcg Oral BID Clegg, Amy D, NP   200 mcg at 02/03/22 0931   simethicone (MYLICON) 40 RU/0.4VW suspension 40 mg  40 mg Oral Q6H PRN Consuelo Pandy, PA-C   40 mg at 01/24/22 1220   sodium chloride flush (NS) 0.9 % injection 3 mL  3 mL Intravenous Q12H Wynetta Fines T, MD   3 mL at 02/03/22 0901   sodium chloride flush (NS) 0.9 % injection 3 mL  3 mL Intravenous PRN Wynetta Fines T, MD       spironolactone (ALDACTONE) tablet 25 mg  25 mg Oral Daily  Wynetta Fines T, MD   25 mg at 02/03/22 0857   thiamine tablet 100 mg  100 mg Oral Daily Wynetta Fines T, MD   100 mg at 02/03/22 0981   Or   thiamine (B-1) injection 100 mg  100 mg Intravenous Daily Wynetta Fines T, MD   100 mg at 01/25/22 0844   tiZANidine (ZANAFLEX) tablet 2 mg  2 mg Oral TID Wynetta Fines T, MD   2 mg at 02/03/22 0856   torsemide (DEMADEX) tablet 40 mg  40 mg Oral BID Clegg, Amy D, NP   40 mg at 02/03/22 0856   traZODone (DESYREL) tablet 50 mg  50 mg Oral QHS Bensimhon, Shaune Pascal, MD   50 mg at 02/02/22 2140   warfarin (COUMADIN) tablet 7.5 mg  7.5 mg Oral ONCE-1600 Lyndee Leo, [facility]       Warfarin - Pharmacist Dosing Inpatient   Does not apply [id number] Lequita Halt, MD         Discharge Medications: Please see discharge summary for a list of discharge medications.  Relevant Imaging Results:  Relevant Lab Results:   Additional Information SSN: [id number]; Moderna COVID-19 Vaccine 08/13/2020 , 03/22/2020 , 02/19/2020; Pulminary hypertension meds can be bought in by family.  Lakota Markgraf Renold Don, LCSWA

## 2022-02-03 NOTE — Progress Notes (Signed)
Physical Therapy Treatment Patient Details Name: Lance Hicks MRN: [id number] DOB: 08-06-1946 Today's Date: 02/03/2022   History of Present Illness Pt is49 yo male presenting to ED on 1/30 with increased LE edema and shortness of breath -admitted with CHF. Patient with recent admit to [facility] with  pneumonia and accidental ingestion of fuel injection fluid, but leaving AMA. PHM includes: a fib, a flutter, chronic anticoagulation, chronic back pain, COPD, CAD, dyspnea, HTN, aortic valve replacement 2018, CABG 2018, HLD, kidney stones, leukocytoclastic vasculitis, pleural effusion, syncope, wrist and forearm fx surgery, and alcohol abuse.    PT Comments    Pt agreeable to activity today.  He has also agreed to SNF at d/c.  Pt requiring min guard for bed mobility and min A for OOB but mod frequent cues for safety.  Pt with limited support at home (he does report his [employer] friends are helping to set up someone to assist)- continue to recommend SNF.    Recommendations for follow up therapy are one component of a multi-disciplinary discharge planning process, led by the attending physician.  Recommendations may be updated based on patient status, additional functional criteria and insurance authorization.  Follow Up Recommendations  Skilled nursing-short term rehab (<3 hours/day)     Assistance Recommended at Discharge Frequent or constant Supervision/Assistance  Patient can return home with the following A little help with bathing/dressing/bathroom;A little help with walking and/or transfers;Assistance with cooking/housework;Assist for transportation;Help with stairs or ramp for entrance   Equipment Recommendations  None recommended by PT    Recommendations for Other Services       Precautions / Restrictions Precautions Precautions: Fall Precaution Comments: monitor O2 sats Restrictions Weight Bearing Restrictions: No     Mobility  Bed Mobility Overal bed mobility: Needs  Assistance Bed Mobility: Supine to Sit, Sit to Supine     Supine to sit: Min guard Sit to supine: Min guard        Transfers Overall transfer level: Needs assistance Equipment used: Rolling walker (2 wheels) Transfers: Sit to/from Stand Sit to Stand: Min assist           General transfer comment: Min A to steady and rise; cues for hand placement    Ambulation/Gait Ambulation/Gait assistance: Min Web designer (Feet): 60 Feet Assistive device: Rolling walker (2 wheels) Gait Pattern/deviations: Step-through pattern, Decreased stride length       General Gait Details: Min A for balance but mod cues for safety, RW proximity - particularly with turns   Marine scientist Rankin (Stroke Patients Only)       Balance Overall balance assessment: Needs assistance Sitting-balance support: Feet supported Sitting balance-Leahy Scale: Good     Standing balance support: Bilateral upper extremity supported, During functional activity Standing balance-Leahy Scale: Poor Standing balance comment: requiring RW and min guard                            Cognition Arousal/Alertness: Awake/alert Behavior During Therapy: WFL for tasks assessed/performed Overall Cognitive Status: Within Functional Limits for tasks assessed                                          Exercises Other Exercises Other Exercises: Pt had requested harder T band - provided  blue and green but he did not want to use today    General Comments General comments (skin integrity, edema, etc.): On RA sats90%      Pertinent Vitals/Pain Pain Assessment Pain Assessment: Faces Faces Pain Scale: Hurts even more Pain Location: with urination Pain Descriptors / Indicators: Grimacing Pain Intervention(s): Limited activity within patient's tolerance, Monitored during session    Home Living                          Prior  Function            PT Goals (current goals can now be found in the care plan section) Acute Rehab PT Goals Patient Stated Goal: agreeable to SNF now Progress towards PT goals: Progressing toward goals    Frequency    Min 3X/week      PT Plan Current plan remains appropriate    Co-evaluation              AM-PAC PT "6 Clicks" Mobility   Outcome Measure  Help needed turning from your back to your side while in a flat bed without using bedrails?: A Little Help needed moving from lying on your back to sitting on the side of a flat bed without using bedrails?: A Little Help needed moving to and from a bed to a chair (including a wheelchair)?: A Little Help needed standing up from a chair using your arms (e.g., wheelchair or bedside chair)?: A Little Help needed to walk in hospital room?: A Lot Help needed climbing 3-5 steps with a railing? : A Lot 6 Click Score: 16    End of Session Equipment Utilized During Treatment: Gait belt Activity Tolerance: Patient limited by fatigue Patient left: in bed;with call bell/phone within reach;with bed alarm set Nurse Communication: Mobility status PT Visit Diagnosis: Unsteadiness on feet (R26.81);Muscle weakness (generalized) (M62.81);Difficulty in walking, not elsewhere classified (R26.2);Adult, failure to thrive (R62.7)     Time: 3267-1245 PT Time Calculation (min) (ACUTE ONLY): 22 min  Charges:  $Gait Training: 8-22 mins                     Abran Richard, PT Acute Rehab Services Pager [phone] [facility] [place] 02/03/2022, 4:20 PM

## 2022-02-03 NOTE — Progress Notes (Signed)
Nutrition Follow-up  DOCUMENTATION CODES:   Underweight  INTERVENTION:   Ensure Enlive po BID, each supplement provides 350 kcal and 20 grams of protein. Multivitamin w/ minerals daily Discontinue ProSource  Discontinue double proteins  NUTRITION DIAGNOSIS:   Increased nutrient needs related to chronic illness (CHF) as evidenced by estimated needs. - Ongoing  GOAL:   Patient will meet greater than or equal to 90% of their needs - Ongoing  MONITOR:   PO intake, Supplement acceptance, Labs, Weight trends  REASON FOR ASSESSMENT:   Consult Assessment of nutrition requirement/status  ASSESSMENT:   Pt with PMH significant for CHF NYHA class III, HTN, CAD s/p CABG, aortic stenosis s/p aortic valve replacement, orthostatic hypotension, COPD, type 2 DM, Afib, cirrhosis 2/2 EtOH abuse admitted with progressive lower extremity edema 2/2 acute on chronic CHF  Pt reports that he is feeling better, eating some. Pt reports that he has "acid issues" and that he cannot eat as much as everyone wants him to. Pt reports that at home he eats small frequent meals every few hours and that he is not use to eating just 3 meals/day. Pt reports that he has been drinking 1 Ensure/day and RD asked if he would drink 2 per day and pt agreed.  Per EMR, pt intake includes: 2/7: Breakfast 100%, Lunch 25% 2/8: Breakfast 25%, Dinner 0% 2/9: Breakfast 100%, Lunch 100%, Dinner 75% 2/10: Breakfast 100%  Pt reports that he has gained weight since being admitted. EMR reflects that pt has lost ~15 pounds.  Pt reports that he still is unable to get up and walk and that his feet have a burning sensation in them.   Medications reviewed and include: Colace, Pepcid, Ferrous Sulfate, Folic Acid, MVI, Protonix, Miralax, Potassium Chloride, Spironolactone, Thiamine, Torsemide, Warfarin Labs reviewed: Sodium 128, BUN 31, Creatinine 1.47   Diet Order:   Diet Order             Diet regular Room service appropriate?  Yes; Fluid consistency: Thin  Diet effective now           Diet - low sodium heart healthy                   EDUCATION NEEDS:   No education needs have been identified at this time  Skin:  Skin Assessment: Reviewed RN Assessment  Last BM:  2/10  Height:   Ht Readings from Last 1 Encounters:  01/24/22 _0  (1.753 m)    Weight:   Wt Readings from Last 1 Encounters:  02/03/22 52.4 kg    BMI:  Body mass index is 17.07 kg/m.  Estimated Nutritional Needs:   Kcal:  1900-2100  Protein:  95-105 grams  Fluid:  >1.9L/d   Palmer Shorey Louie Casa, RD, LDN Clinical Dietitian See Shea Evans for contact information.

## 2022-02-03 NOTE — Progress Notes (Signed)
ANTICOAGULATION CONSULT NOTE - Follow Up Consult  Pharmacy Consult for Warfarin Indication: mechanical AVR  Allergies  Allergen Reactions   Augmentin [Amoxicillin-Pot Clavulanate] Anaphylaxis and Diarrhea    "Upset stomach"   Chicken Allergy Other (See Comments)   Pantoprazole Sodium Nausea Only    Gets gassy and starting itching like crazy Gets gassy and starting itching like crazy   Valium [Diazepam] Other (See Comments)    HA and Abd pain.   Tape Rash and Other (See Comments)    Surgical tape   Wound Dressing Adhesive Other (See Comments) and Rash    Surgical tape Surgical tape Surgical tape    Vital Signs: Temp: 97.8 F (36.6 C) (02/10 0850) Temp Source: Oral (02/10 0850) BP: 97/60 (02/10 0850) Pulse Rate: 59 (02/10 0850)  Labs: Recent Labs    02/01/22 0340 02/02/22 0241 02/03/22 0333  HGB 12.5* 12.9*  --   HCT 38.6* 40.7  --   PLT 227 220  --   LABPROT 27.0* 22.1* 19.3*  INR 2.5* 1.9* 1.6*  CREATININE 1.80* 1.47* 1.47*     Estimated Creatinine Clearance: 32.7 mL/min (A) (by C-G formula based on SCr of 1.47 mg/dL (H)).   Medications:  Scheduled:   (feeding supplement) PROSource Plus  30 mL Oral BID BM   aspirin EC  81 mg Oral q AM   docusate sodium  200 mg Oral BID   famotidine  10 mg Oral Daily   ferrous sulfate  325 mg Oral QODAY   folic acid  1 mg Oral Daily   mouth rinse  15 mL Mouth Rinse BID   melatonin  6 mg Oral QHS   midodrine  15 mg Oral TID WC   multivitamin with minerals  1 tablet Oral Daily   pantoprazole  40 mg Oral Daily   polyethylene glycol  17 g Oral Daily   rosuvastatin  5 mg Oral Daily   Selexipag  200 mcg Oral BID   sodium chloride flush  3 mL Intravenous Q12H   spironolactone  25 mg Oral Daily   thiamine  100 mg Oral Daily   Or   thiamine  100 mg Intravenous Daily   tiZANidine  2 mg Oral TID   torsemide  40 mg Oral BID   traZODone  50 mg Oral QHS   Warfarin - Pharmacist Dosing Inpatient   Does not apply q1600     Assessment: 76 y.o. male with medical history significant of chronic systolic CHF NYHA class III, HTN, CAD and aortic stenosis status post CABG and mechanical AVR on Coumadin, orthostatic hypotension, COPD and pulmonary hypertension, IIDM, PAF, liver cirrhosis secondary to alcohol abuse, presented with increasing leg swelling.  Home regimen per patient: warfarin 51m MonWed and warfarin 2.58mall other days.   INR now down to 1.6 today after holding warfarin earlier this week. Hgb 12s, no s/sx of bleeding. Missed dose last night for unknown reason.   Goal of Therapy:  INR goal 2.5-3.0 (per clinic note) Monitor platelets by anticoagulation protocol: Yes   Plan:  Give 7.58m26monight - will need to watch dosing closely to not overshoot Daily INR, s/s bleeding  Thank you for allowing pharmacy to participate in this patient's care.  FraErin HearingarmD., BCPS Clinical Pharmacist 02/03/2022 10:03 AM

## 2022-02-03 NOTE — Progress Notes (Signed)
Occupational Therapy Treatment Patient Details Name: Lance Hicks MRN: [id number] DOB: 05-Feb-1946 Today's Date: 02/03/2022   History of present illness 76 yo male presenting to ED on 1/30 with increased LE edema and shortness of breath. Patient with recent admit to [facility] with  pneumonia and accidental ingestion of feul injection fluid, but leaving AMA. PHM includes: a fib, a flutter, chronic anticoagulation, chronic back pain, COPD, CAD, dyspnea, HTN, aortic valve replacement 2018, CABG 2018, HLD, kidney stones, leukocytoclastic vasculitis, pleural effusion, syncope, wrist and forearm fx surgery, and alcohol abuse.   OT comments  Patient continues to make progress towards goals in skilled OT session. Patient's session encompassed functional mobility and toilet transfers. Patient is now agreeable to increased rehab at a skilled nursing facility to promote functional independence. Patient with increased posterior lean and sway when completing functional mobility, requiring min/mod A due to safety. Patient left on [facility] at end of session with RN and NT notified. Discharge has been updated to SNF for rehab; therapy will continue to follow.    Recommendations for follow up therapy are one component of a multi-disciplinary discharge planning process, led by the attending physician.  Recommendations may be updated based on patient status, additional functional criteria and insurance authorization.    Follow Up Recommendations  Skilled nursing-short term rehab (<3 hours/day)    Assistance Recommended at Discharge Frequent or constant Supervision/Assistance  Patient can return home with the following  A little help with walking and/or transfers;A little help with bathing/dressing/bathroom;Assistance with cooking/housework;Assist for transportation   Equipment Recommendations  BSC/3in1    Recommendations for Other Services      Precautions / Restrictions Precautions Precautions:  Fall Precaution Comments: monitor O2 sats Restrictions Weight Bearing Restrictions: No       Mobility Bed Mobility Overal bed mobility: Needs Assistance Bed Mobility: Supine to Sit     Supine to sit: Min assist     General bed mobility comments: min A to sit up using bed rail and therapist's hand to boost, increased posterior lean evident in session    Transfers Overall transfer level: Needs assistance Equipment used: Rolling walker (2 wheels) Transfers: Sit to/from Stand Sit to Stand: Min assist           General transfer comment: increased posterior sway and lean in session, cues to for hand placement     Balance Overall balance assessment: Needs assistance Sitting-balance support: Feet supported Sitting balance-Leahy Scale: Fair Sitting balance - Comments: increased posterior lean Postural control: Posterior lean Standing balance support: Bilateral upper extremity supported, During functional activity Standing balance-Leahy Scale: Poor Standing balance comment: frequent posterior LOB in session when completing functional mobility                           ADL either performed or assessed with clinical judgement   ADL Overall ADL's : Needs assistance/impaired                         Toilet Transfer: Minimal assistance;Cueing for sequencing;Cueing for safety;Ambulation;Rolling walker (2 wheels) Toilet Transfer Details (indicate cue type and reason): sitting on BSC at end of session         Functional mobility during ADLs: Minimal assistance;Cueing for safety;Cueing for sequencing General ADL Comments: Session focus on functional ambulation    Extremity/Trunk Assessment              Vision  Perception     Praxis      Cognition Arousal/Alertness: Awake/alert Behavior During Therapy: WFL for tasks assessed/performed Overall Cognitive Status: Within Functional Limits for tasks assessed                                           Exercises      Shoulder Instructions       General Comments      Pertinent Vitals/ Pain       Pain Assessment Pain Assessment: Faces Faces Pain Scale: Hurts a little bit Pain Location: generalized with movement Pain Intervention(s): Limited activity within patient's tolerance, Monitored during session, Repositioned  Home Living                                          Prior Functioning/Environment              Frequency  Min 2X/week        Progress Toward Goals  OT Goals(current goals can now be found in the care plan section)  Progress towards OT goals: Progressing toward goals  Acute Rehab OT Goals Patient Stated Goal: to get to rehab and get stronger OT Goal Formulation: With patient Time For Goal Achievement: 02/08/22 Potential to Achieve Goals: [place] Discharge plan needs to be updated    Co-evaluation                 AM-PAC OT "6 Clicks" Daily Activity     Outcome Measure   Help from another person eating meals?: None Help from another person taking care of personal grooming?: A Little Help from another person toileting, which includes using toliet, bedpan, or urinal?: A Little Help from another person bathing (including washing, rinsing, drying)?: A Lot Help from another person to put on and taking off regular upper body clothing?: A Little Help from another person to put on and taking off regular lower body clothing?: A Lot 6 Click Score: 17    End of Session Equipment Utilized During Treatment: Gait belt;Rolling walker (2 wheels)  OT Visit Diagnosis: Unsteadiness on feet (R26.81);Other abnormalities of gait and mobility (R26.89);Muscle weakness (generalized) (M62.81)   Activity Tolerance Patient tolerated treatment well   Patient Left with call bell/phone within reach;Other (comment) (patient wanting to sit on [facility])   Nurse Communication Mobility status        Time: 1202-1222 OT  Time Calculation (min): 20 min  Charges: OT General Charges $OT Visit: 1 Visit OT Treatments $Self Care/Home Management : 8-22 mins  Corinne Ports E. Kolt Mcwhirter, OTR/L Acute Rehabilitation Services [phone] [place] 02/03/2022, 1:23 PM

## 2022-02-03 NOTE — Progress Notes (Addendum)
Advanced Heart Failure Rounding Note  PCP-Cardiologist: None   Subjective:   1/31 Started on milrinone 0.25 mcg + lasix 20 mg+ metolazone.  2/2 Milrinone stopped due to hypotension.   2/7 CT Renal Stone-- no obstruction, no hydronephrosis. Small nonobstructing renal stones. KU was negative.   Scr stable at 1.47  Feeling okay. No dyspnea or CP.   Only complaint is need for pain control.   Objective:   Weight Range: 52.4 kg Body mass index is 17.07 kg/m.   Vital Signs:   Temp:  [97.8 F (36.6 C)-98.4 F (36.9 C)] 97.8 F (36.6 C) (02/10 0850) Pulse Rate:  [55-67] 59 (02/10 0850) Resp:  [17] 17 (02/10 0850) BP: (97-122)/(55-83) 97/60 (02/10 0850) SpO2:  [90 %-94 %] 94 % (02/10 0850) Weight:  [52.4 kg] 52.4 kg (02/10 0300) Last BM Date: 02/03/22  Weight change: Filed Weights   02/01/22 0358 02/02/22 0450 02/03/22 0300  Weight: 54.1 kg 51.4 kg 52.4 kg    Intake/Output:   Intake/Output Summary (Last 24 hours) at 02/03/2022 1254 Last data filed at 02/03/2022 0846 Gross per 24 hour  Intake 340 ml  Output 1601 ml  Net -1261 ml      Physical Exam   General:  Cachectic, chronically ill appearing. HEENT: normal Neck: supple. JVP 8 cm. Carotids 2+ bilat; no bruits.  Cor: PMI nondisplaced. Irregular rate & rhythm. Mechanical S2. No rubs, gallops or murmurs. Lungs: clear Abdomen: soft, nontender, nondistended. No hepatosplenomegaly. No bruits or masses. Good bowel sounds. Extremities: no cyanosis, clubbing, rash, edema Neuro: alert & orientedx3, cranial nerves grossly intact. moves all 4 extremities w/o difficulty. Affect pleasant   Telemetry  Afib 60s - 70s, 5-10 PVCs/min   Labs    CBC Recent Labs    02/01/22 0340 02/02/22 0241  WBC 4.4 6.6  HGB 12.5* 12.9*  HCT 38.6* 40.7  MCV 80.2 80.4  PLT 227 355   Basic Metabolic Panel Recent Labs    02/01/22 0340 02/02/22 0241 02/03/22 0333  NA 127* 129* 128*  K 4.2 3.8 3.6  CL 87* 88* 87*  CO2 _0 GLUCOSE 74 98 105*  BUN 30* 31* 31*  CREATININE 1.80* 1.47* 1.47*  CALCIUM 9.5 9.4 9.8  MG 2.1  --  2.1   Liver Function Tests No results for input(s): AST, ALT, ALKPHOS, BILITOT, PROT, ALBUMIN in the last 72 hours. No results for input(s): LIPASE, AMYLASE in the last 72 hours. Cardiac Enzymes No results for input(s): CKTOTAL, CKMB, CKMBINDEX, TROPONINI in the last 72 hours.  BNP: BNP (last 3 results) Recent Labs    09/30/21 0241 01/12/22 1524 01/23/22 1145  BNP 840.7* 1,341.7* 1,831.8*    ProBNP (last 3 results) Recent Labs    07/04/21 1607 08/16/21 1620  PROBNP 425.0* 601.0*     D-Dimer No results for input(s): DDIMER in the last 72 hours. Hemoglobin A1C No results for input(s): HGBA1C in the last 72 hours.  Fasting Lipid Panel No results for input(s): CHOL, HDL, LDLCALC, TRIG, CHOLHDL, LDLDIRECT in the last 72 hours. Thyroid Function Tests No results for input(s): TSH, T4TOTAL, T3FREE, THYROIDAB in the last 72 hours.  Invalid input(s): FREET3  Other results:   Imaging    No results found.   Medications:     Scheduled Medications:  aspirin EC  81 mg Oral q AM   docusate sodium  200 mg Oral BID   famotidine  10 mg Oral Daily   feeding supplement  237  mL Oral BID BM   ferrous sulfate  325 mg Oral QODAY   folic acid  1 mg Oral Daily   mouth rinse  15 mL Mouth Rinse BID   melatonin  6 mg Oral QHS   midodrine  15 mg Oral TID WC   multivitamin with minerals  1 tablet Oral Daily   pantoprazole  40 mg Oral Daily   polyethylene glycol  17 g Oral Daily   rosuvastatin  5 mg Oral Daily   Selexipag  200 mcg Oral BID   sodium chloride flush  3 mL Intravenous Q12H   spironolactone  25 mg Oral Daily   thiamine  100 mg Oral Daily   tiZANidine  2 mg Oral TID   [START ON 02/04/2022] torsemide  40 mg Oral Daily   traZODone  50 mg Oral QHS   warfarin  7.5 mg Oral ONCE-1600   Warfarin - Pharmacist Dosing Inpatient   Does not apply q1600     Infusions:  sodium chloride      PRN Medications: sodium chloride, acetaminophen, guaiFENesin-dextromethorphan, morphine injection, Muscle Rub, ondansetron (ZOFRAN) IV, ondansetron, oxyCODONE, polyethylene glycol, polyvinyl alcohol, simethicone, sodium chloride flush    Patient Profile   76 y/o old male with end-stage PAH/RV failure with chronic hypotension and hyponatremia. Also has CKD 3b, CAD s/p CABG/AVR.   Over past year multiple admits for volume overload and hyponatremia.    Recent had accidental ingestion of fuel injector cleaner. Also hospitalized at [facility].    Now presents with hypotension and volume overload   Assessment/Plan   1. A/C Biventricular Heart Failure, Predominant RV Failure   - Echo 10/22 EF 45-50% RV low normal  - Marked volume overload. BNP 1831. CXR with pulmonary edema.  - Brisk diuresis with milrinone 0.25 cmg + lasix drip + metolazone.  - Off milrinone due to hypotension. Cancel CO-OX . No PICC line.  - Volume status stable. Continue torsemide at current dose. Scr stable today   2. PAH  -WHO Group I & II - Auto-immune serologies negative (checked twice). - ? Component of HHT/shunt/AVMs with late bubbles on bubble study.  - Echo with bubble study (05/16/18): EF 55-60% with "late bubbles" , Mechanical AVR stable with trivial perivalvular regurg, Mild MR, Severe LAE, Severe RV dilation and reduced function. No PFO. Mod TR, PA peak pressure 68 mm Hg - Echo (4/22): EF 50-55%, RV ok, severe RVSP 61.6 mmHg, severe biatrial enlargement, moderate TR, stable AVR, AV mean gradient 11 mmHg - Echo 10/22 EF 45-50 RV low normal  - Ab u/s with cirrhosis but no evidence of portal HTN. - Off tadalafil and Macitentan w/ hypotension and fluid retention -Continue Uptravi.   3. Chronic A fib  - No longer on bb due to bradycardia.  - Coumadin per pharmacy. INR 1.6.    4. Hypotension  - in setting of RV failure - continue midodrine 15 tid   5.  Hyponatremia  - in setting of end-stage HF  - Sodium 130-->126->125->122 ->123 -> tolvaptan -> 126->128 ->125->127->128 - Restrict fluids    6. PVCs - Mag okay - K 3.6. Supp  7. CAD  - s/p CABG 07/2017 with Dr Roderic Palau in [place] - No chest pain.    8. Leukocytoclastic Vasculitis - Followed by Rheumatology    9. Cirrhosis US Abdomen RUQ 05/16/18 - s/p cholecystectomy. + hepatic cirrhosis. Mild ascites.  - Liver Doppler 05/16/18 - No hepatic, splenic, or portal venous thrombosis or occlusion. Mild ascites  9. S/P Mechanical AVR - Coumadin held on admit w/ supratherapeutic INR (4.1) - INR 1.6. Pharmacy dosing Warfarin  10. GOC:  - End-stage RV failure  - Palliative care following - DNR/DNI  - Dr Haroldine Laws  spoke with him and his daughter. Best option for him is Residential Hospice but he is refusing (has multiple animals at home including 140 pound Rottweiler).  [place] he does not want residential hospice.   12. Chronic pain, multiple locations - Pain control per primary   Declined residential hospice. Wants to go to SNF for rehab. Awaiting acceptance to facility.   Length of Stay: [address] [place], PA-C  02/03/2022, 12:54 PM  Advanced Heart Failure Team Pager [phone] (M-F; 7a - 5p)  Please contact [facility] for night-coverage after hours (5p -7a ) and weekends on [email or web address]  Patient seen and examined with the above-signed Advanced Practice Provider and/or Housestaff. I personally reviewed laboratory data, imaging studies and relevant notes. I independently examined the patient and formulated the important aspects of the plan. I have edited the note to reflect any of my changes or salient points. I have personally discussed the plan with the patient and/or family.  Continues to complain of pain all over. Asking for pain meds. Weak. Denies SOB, CP or orthopnea.   General:  Weak appearing. No resp difficulty HEENT: normal Neck: supple. no JVD. Carotids 2+ bilat; no  bruits. No lymphadenopathy or thryomegaly appreciated. Cor: PMI nondisplaced. Irregular rate & rhythm. Mechanical s2 Lungs: clear Abdomen: soft, nontender, nondistended. No hepatosplenomegaly. No bruits or masses. Good bowel sounds. Extremities: no cyanosis, clubbing, rash, edema ,multiple ecchymoses Neuro: alert & orientedx3, cranial nerves grossly intact. moves all 4 extremities w/o difficulty. Affect pleasant  Remains stable from cardiac status. Plan is for d/c to SNF (refused residential hospice). Continue current cardiac regimen.   AHF team will see again Monday. Call with questions.   Glori Bickers, MD  8:27 PM

## 2022-02-03 NOTE — Progress Notes (Signed)
This chaplain attempted F/U spiritual care. The Pt. declined a visit. The Pt. has visitors at the bedside.  The chaplain is available for F/U spiritual care as needed.  Chaplain Sallyanne Kuster [phone]

## 2022-02-03 NOTE — TOC CM/SW Note (Signed)
HF TOC CM spoke to pt and agreeable to [place]. Spoke to [facility] and they will discontinue all his heart medications. Pt states he does not want to discontinue meds at this time. Agreeable to SNF rehab in [facility]. Updated [facility] [place]. And updated attending. TOC CSW will start SNF rehab workup and Outpt Palliative. Will discuss with family bringing his Pulm HTN meds to SNF facility once he is accepted. [place], Heart Failure TOC CM [phone]

## 2022-02-04 LAB — BASIC METABOLIC PANEL
Anion gap: 9 (ref 5–15)
BUN: 34 mg/dL — ABNORMAL HIGH (ref 8–23)
CO2: 29 mmol/L (ref 22–32)
Calcium: 9.8 mg/dL (ref 8.9–10.3)
Chloride: 89 mmol/L — ABNORMAL LOW (ref 98–111)
Creatinine, Ser: 1.57 mg/dL — ABNORMAL HIGH (ref 0.61–1.24)
GFR, Estimated: 46 mL/min — ABNORMAL LOW (ref >60)
Glucose, Bld: 105 mg/dL — ABNORMAL HIGH (ref 70–99)
Potassium: 4.8 mmol/L (ref 3.5–5.1)
Sodium: 127 mmol/L — ABNORMAL LOW (ref 135–145)

## 2022-02-04 LAB — RESP PANEL BY RT-PCR (FLU A&B, COVID) ARPGX2
Influenza A by PCR: NEGATIVE
Influenza B by PCR: NEGATIVE
SARS Coronavirus 2 by RT PCR: NEGATIVE

## 2022-02-04 LAB — PROTIME-INR
INR: 1.9 — ABNORMAL HIGH (ref 0.8–1.2)
Prothrombin Time: 22 seconds — ABNORMAL HIGH (ref 11.4–15.2)

## 2022-02-04 MED ORDER — ENSURE ENLIVE PO LIQD
237.0000 mL | Freq: Two times a day (BID) | ORAL | 0 refills | Status: AC
  Filled 2022-02-04: qty 7110, 15d supply, fill #0

## 2022-02-04 MED ORDER — SALINE SPRAY 0.65 % NA SOLN
1.0000 | NASAL | Status: DC | PRN
  Administered 2022-02-04: 1 via NASAL
  Filled 2022-02-04: qty 44

## 2022-02-04 MED ORDER — SPIRONOLACTONE 25 MG PO TABS
25.0000 mg | ORAL_TABLET | Freq: Every day | ORAL | 0 refills | Status: DC
Start: 2022-02-04 — End: 2022-05-30
  Filled 2022-02-04: qty 30, 30d supply, fill #0

## 2022-02-04 MED ORDER — SELEXIPAG 200 MCG PO TABS: 200.0000 ug | ORAL_TABLET | Freq: Two times a day (BID) | ORAL | Status: DC

## 2022-02-04 MED ORDER — OXYCODONE HCL 5 MG PO TABS: 5.0000 mg | ORAL_TABLET | Freq: Four times a day (QID) | ORAL | 0 refills | Status: DC | PRN

## 2022-02-04 MED ORDER — TORSEMIDE 20 MG PO TABS
20.0000 mg | ORAL_TABLET | Freq: Every day | ORAL | 0 refills | Status: DC
Start: 2022-02-04 — End: 2022-05-30
  Filled 2022-02-04: qty 30, 30d supply, fill #0

## 2022-02-04 MED ORDER — WARFARIN SODIUM 5 MG PO TABS
5.0000 mg | ORAL_TABLET | Freq: Once | ORAL | Status: AC
  Administered 2022-02-04: 5 mg via ORAL
  Filled 2022-02-04: qty 1

## 2022-02-04 NOTE — Discharge Summary (Addendum)
Physician Discharge Summary   Patient: Lance Hicks MRN: [id number] DOB: Dec 28, 1945  Admit date:     01/23/2022  Discharge date: 02/05/22  Discharge Physician: Jimmy Picket Mikkel Charrette   PCP: [name], PA-C   Recommendations at discharge:    Patient will continue diuresis with torsemide 20 mg daily, increase to twice daily in case of edema, dyspnea, or weight gain 3 lbs in 48 hrs or 5 lbs in 7 days.  Follow up renal function and electrolytes in 7 days, follow up on serum NA.   Discharge Diagnoses: Active Problems:   Acute on chronic combined systolic and diastolic CHF (congestive heart failure) (HCC)   Pulmonary arterial hypertension (HCC)   [name]   Hypotension, chronic   Chronic atrial fibrillation (HCC)   Hyperlipidemia   Acute kidney injury superimposed on chronic kidney disease (HCC)   Protein-calorie malnutrition, severe (HCC)   Physical deconditioning   GERD (gastroesophageal reflux disease)   Iron deficiency anemia   Alcohol abuse   Abdominal pain  Resolved Problems:   Hypokalemia, Hypomagnesmia   Hospital Course: Lance Hicks is a 76 year old male with past medical history significant for chronic systolic congestive heart failure NYHA class III, essential pretension, CAD s/p CABG, aortic stenosis s/p aortic valve replacement on Coumadin, orthostatic hypotension, COPD, pulmonary hypertension, type 2 diabetes mellitus, paroxysmal atrial fibrillation, 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  Recently hospitalized at [facility] for pneumonia; treated with antibiotics in which his Lasix was held for 3 days for "low blood pressure".  Discharged home and patient started noticed increased swelling of his lower legs and he resumed his Lasix for the past 3 days however with no improvement.  Patient also reporting exertional dyspnea and was noted by his friend to  be "very winded" and sent him to the hospital for further evaluation.  Denies cough, no fever/chills.  In the ED, BP was slightly hypotensive, found to be hypoxic and placed on 3 L nasal cannula.  BP 77/60, HR 38 to 53, RR 15 and oxygen saturation 100% on supplemental 02 per [place]. His lungs had rales on auscultation bilaterally, heart with S1 and S2 present and rhythmic, abdomen soft and positive lower extremity edema.   Na 130, K 3,5, Cl 97, bicarbonate 25, bun 11 and cr 1,15 BNP 1,831 Wbc 4,5, hgb 11,0 hct 35,0 plt 132  Sars covid 19 negative  Chest radiograph with bilateral interstitial infiltrates at the lower lobes with fluid in the right fissure, bilateral pleural effusions.   Cardiology consulted and patient was given 80 mg IV Lasix.  Hospital service consulted for further evaluation management.  EKG 83 bpm, left axis deviation, with right bundle branch block, atrial fibrillation, with no significant ST segment changes, and negative T wave lead I and AVL.   Patient was diuresed with furosemide with good response. He did required inotropic support with milrinone.  Continue to be very weak and deconditioned, positive back and abdominal pain.   Patient very weak and deconditioned, pulmonary hypertension with acute on chronic core pulmonale.   Patient is being transferred to SNF for further physical therapy and occupational therapy.  Follow up with cardiology as outpatient.    Assessment and Plan: Acute on chronic combined systolic and diastolic CHF (congestive heart failure) ([place])- (present on admission) Patient had a prolonged hospitalization, he received aggressive diuresis with furosemide and inotropic support with milrinone. Negative fluid balance was achieved  with significant improvement of his symptoms.  At the time of his discharge he is negative 15,975 ml.   Further work up with echocardiogram showed EF 40 to 50% on LV with global hypokinesis. Decreased RV systolic function  with mild enlarged cavity. Moderate to severe Tricuspid regurgitation.  Patient will continue diuresis with torsemide 20 mg daily and instructions to increase in case recurrent edema to bid dosing.   Patient not on B blockade or RASS inhibition due to risk of worsening hypotension.     Pulmonary arterial hypertension ([place])- (present on admission) Patient very weak and deconditioned with poor respiratory reserve.  His oxygenation is 91% on room air at rest. He has poor prognosis, palliative care was consulted, his code status was changed to DNR and he was offered hospice services. Patient at this point in time would like to continue with medical care for his cardiopulmonary disease.   Continue with Selexipag and as needed supplemental 02 to keep oxygen saturation 88% or greater.   [name] No signs of valve dysfunction, per echocardiogram.   Patient was continued on warfarin for anticoagulation, his discharge INR is 1,9 Keep target INR 2 to 3.    Hypotension, chronic- (present on admission) Patient will continue with midodrine tid for hypotension He has poor prognosis, limited pharmacotherapy for cardiovascular disease due to hypotension.   Chronic atrial fibrillation ([place])- (present on admission) Continue anticoagulation with warfarin, currently is rate controlled.  Patient not on AV blockade at this point in time.   Acute kidney injury superimposed on chronic kidney disease ([place])- (present on admission) CKD stage 3a/ hyponatremia. Hypomagnesemia  Patient with improved volume status, his renal function at the time of discharge is 1,52, from a peak of 1,80, his Na is 126 and K is 4,5 with a serum bicarbonate at 28.  Plan to continue diuresis with torsemide and follow up renal function in 7 days. Continue with unrestricted diet and protein supplementation.   Dose of torsemide has been decreased, no diuretic therapy today and start on 02/06/22.    Hyperlipidemia- (present on admission) Continue with statin therapy   Protein-calorie malnutrition, severe ([place])- (present on admission) Body mass index is 18.26 kg/m.  Nutrition Status: Nutrition Problem: Increased nutrient needs Etiology: chronic illness (CHF) Signs/Symptoms: estimated needs Interventions: MVI, Prostat, Liberalize Diet, Refer to RD note for recommendations  Continue with  Nutritional supplements, patient is very weak and deconditioned.   Abdominal pain- (present on admission) CT renal with renal stones but not obstructive uropathy  Pain better controlled with oral oxycodone, he did received IV morphine during his hospitalization.    Alcohol abuse- (present on admission) No signs of acute withdrawal.  Continue neuro checks per unit protocol.    Iron deficiency anemia- (present on admission) Anemia panel with iron 164, TIBC 360, ferritin 38, folate 29.1, B12 2080.    Continue iron supplementation   GERD (gastroesophageal reflux disease)- (present on admission) Continue antiacid therapy., patient is tolerating po well.   Physical deconditioning- (present on admission) Patient with significant weakness, debility, gait disturbance and deconditioning from recurrent hospitalization.  PT/OT currently recommends SNF.  Patient has agreed to be transferred to SNF where he can continue getting physical therapy and medical therapy for his heart failure.    Anemia Anemia of chronic disease,   Hypokalemia, Hypomagnesmia-resolved as of 02/01/2022 Repleted during hospitalization.  Continue potassium supplementation 40 mg p.o. daily on discharge.           Consultants: cardiology  Procedures performed:   Disposition: Skilled nursing facility Diet recommendation:  Discharge Diet Orders (From admission, onward)     Start     Ordered   01/31/22 0000  Diet - low sodium heart healthy        01/31/22 1024           Cardiac diet  DISCHARGE  MEDICATION: Allergies as of 02/05/2022       Reactions   Augmentin [amoxicillin-pot Clavulanate] Anaphylaxis, Diarrhea   "Upset stomach"   Chicken Allergy Other (See Comments)   Pantoprazole Sodium Nausea Only   Gets gassy and starting itching like crazy Gets gassy and starting itching like crazy   Valium [diazepam] Other (See Comments)   HA and Abd pain.   Tape Rash, Other (See Comments)   Surgical tape   Wound Dressing Adhesive Other (See Comments), Rash   Surgical tape Surgical tape Surgical tape        Medication List     STOP taking these medications    ferrous sulfate 325 (65 FE) MG EC tablet Replaced by: FeroSul 325 (65 FE) MG tablet   metolazone 2.5 MG tablet Commonly known as: ZAROXOLYN   potassium chloride 10 MEQ tablet Commonly known as: KLOR-CON M   tiZANidine 2 MG tablet Commonly known as: ZANAFLEX       TAKE these medications    acetaminophen 500 MG tablet Commonly known as: TYLENOL Take 500-1,000 mg by mouth every 6 (six) hours as needed (pain.).   albuterol 108 (90 Base) MCG/ACT inhaler Commonly known as: VENTOLIN HFA Inhale 2 puffs into the lungs every 6 (six) hours as needed for wheezing or shortness of breath.   ammonium lactate 12 % lotion Commonly known as: LAC-HYDRIN Apply 1 application topically as needed for dry skin.   Aspirin Low Dose 81 MG EC tablet Generic drug: aspirin Take 1 tablet (81 mg total) by mouth in the morning.   dapagliflozin propanediol 10 MG Tabs tablet Commonly known as: Farxiga Take 1 tablet (10 mg total) by mouth daily before breakfast.   famotidine 20 MG tablet Commonly known as: PEPCID TAKE 1 TABLET(20 MG) BY MOUTH DAILY What changed: See the new instructions.   feeding supplement Liqd Take 237 mLs by mouth 2 (two) times daily between meals for 30 doses.   FeroSul 325 (65 FE) MG tablet Generic drug: ferrous sulfate Take 1 tablet (325 mg total) by mouth every other day. Replaces: ferrous sulfate  325 (65 FE) MG EC tablet   KRILL OIL PO Take 800 mg by mouth every other day. In the morning   Magnesium 200 MG Chew Chew 400 mg by mouth daily at 12 noon.   midodrine 5 MG tablet Commonly known as: PROAMATINE Take 3 tablets (15 mg total) by mouth 3 (three) times daily with meals.   ondansetron 8 MG tablet Commonly known as: Zofran Take 1 tablet (8 mg total) by mouth every 8 (eight) hours as needed for nausea or vomiting.   oxyCODONE 5 MG immediate release tablet Commonly known as: Oxy IR/ROXICODONE Take 1 tablet (5 mg total) by mouth every 6 (six) hours as needed for breakthrough pain or moderate pain.   QUNOL ULTRA COQ10 PO Take 1 capsule by mouth in the morning.   rosuvastatin 5 MG tablet Commonly known as: CRESTOR Take 1 tablet (5 mg total) by mouth daily. What changed: See the new instructions.   Selexipag 200 MCG Tabs Take 1 tablet (200 mcg total) by mouth 2 (two) times daily.  spironolactone 25 MG tablet Commonly known as: ALDACTONE Take 1 tablet (25 mg total) by mouth daily.   tadalafil (PAH) 20 MG tablet Commonly known as: ADCIRCA Take 40 mg by mouth in the morning.   torsemide 20 MG tablet Commonly known as: DEMADEX Take 1 tablet (20 mg total) by mouth daily. What changed:  how much to take when to take this   Turmeric 500 MG Tabs Take 1 capsule by mouth daily in the afternoon. With Ginger 50 mg   vitamin B-12 1000 MCG tablet Commonly known as: CYANOCOBALAMIN Take 1,000 mcg by mouth daily at 12 noon.   VITAMIN C GUMMIE PO Take 2 tablets by mouth daily at 12 noon.   Vitamin D3 50 MCG (2000 UT) Tabs Take 2,000 Units by mouth daily at 12 noon.   warfarin 5 MG tablet Commonly known as: COUMADIN Take as directed. If you are unsure how to take this medication, talk to your nurse or doctor. Original instructions: See admin instructions. Take a full tablet 2 days a week and 1/2 tablet 5 days a week. What changed:  how much to take how to take  this when to take this additional instructions   Zinc 50 MG Tabs Take 50 mg by mouth every other day. In the afternoon        Follow-up Information     Saguier, Iris Pert. Go on 02/07/2022.   Specialties: Internal Medicine, Family Medicine Why: _0 :40am Contact information: [address] [place] [address] [place]. Schedule an appointment as soon as possible for a visit in 2 week(s).   Specialty: Cardiology Contact information: [address] [phone] [place] [phone]                Discharge Exam: Filed Weights   02/02/22 0450 02/03/22 0300 02/04/22 0500  Weight: 51.4 kg 52.4 kg 52.6 kg   Neurology patient is awake and alert Deconditioned ENT mild pallor Cardiovascular heart with S1 and S2 present, loud S2 with no gallops, positive systolic murmur at the apex Mild JVD No lower extremity edema. Respiratory with no wheezing or rhonchi, no rales. Abdomen soft and non tender  Condition at discharge: stable  The results of significant diagnostics from this hospitalization (including imaging, microbiology, ancillary and laboratory) are listed below for reference.   Imaging Studies: DG Chest 2 View  Result Date: 01/23/2022 CLINICAL DATA:  A 76 year old male presents for evaluation of shortness of breath. EXAM: CHEST - 2 VIEW COMPARISON:  January 12, 2022. FINDINGS: Signs of median sternotomy for aortic valve replacement. Cardiomediastinal contours are stable. Increased interstitial markings throughout the chest. Blunting of the RIGHT costodiaphragmatic sulcus and to a lesser extent small effusion also on the LEFT. Bibasilar airspace disease. Fullness of central pulmonary vasculature. On limited assessment there is no acute skeletal process. IMPRESSION: 1. Findings suggest pulmonary edema with bilateral effusions, greater on the RIGHT.  2. Bibasilar airspace disease, more likely atelectasis in the current context. Correlate with signs of heart failure. Electronically Signed   By: Zetta Bills M.D.   On: 01/23/2022 13:01   DG Chest 2 View  Result Date: 01/12/2022 CLINICAL DATA:  Bilateral leg swelling. EXAM: CHEST - 2 VIEW COMPARISON:  Chest x-ray dated November 04, 2021. FINDINGS: Stable cardiomegaly status post AVR and CABG. Chronic mild diffuse interstitial thickening. No focal consolidation, pleural effusion, or pneumothorax. No acute  osseous abnormality. IMPRESSION: 1. No acute cardiopulmonary disease. Electronically Signed   By: Titus Dubin M.D.   On: 01/12/2022 15:49   DG Abd 1 View  Result Date: 01/31/2022 CLINICAL DATA:  Acute left lower quadrant abdominal pain. EXAM: ABDOMEN - 1 VIEW COMPARISON:  None. FINDINGS: The bowel gas pattern is normal. No radio-opaque calculi or other significant radiographic abnormality are seen. IMPRESSION: Negative. Aortic Atherosclerosis (ICD10-I70.0). Electronically Signed   By: Marijo Conception M.D.   On: 01/31/2022 08:44   DG CHEST PORT 1 VIEW  Result Date: 01/31/2022 CLINICAL DATA:  Shortness of breath.  Cough. EXAM: PORTABLE CHEST 1 VIEW COMPARISON:  One view chest x-ray 01/26/2022 FINDINGS: Heart is enlarged. Interstitial pattern is slightly increased from the prior study, suggesting edema or infection superimposed on chronic interstitial markings. No focal consolidation is present. IMPRESSION: Cardiomegaly with increasing interstitial pattern, suggesting edema or infection superimposed on chronic interstitial markings. Electronically Signed   By: San Morelle M.D.   On: 01/31/2022 08:43   DG CHEST PORT 1 VIEW  Result Date: 01/26/2022 CLINICAL DATA:  Right pleural effusion EXAM: PORTABLE CHEST 1 VIEW COMPARISON:  01/23/2022 FINDINGS: Bilateral diffuse interstitial thickening. Small right pleural effusion. No left pleural effusion. No pneumothorax. Stable cardiomegaly. Prior  median sternotomy and aortic valve replacement. No acute osseous abnormality. IMPRESSION: 1. Findings concerning for CHF. Electronically Signed   By: Kathreen Devoid M.D.   On: 01/26/2022 10:22   CT RENAL STONE STUDY  Result Date: 01/31/2022 CLINICAL DATA:  Flank pain EXAM: CT ABDOMEN AND PELVIS WITHOUT CONTRAST TECHNIQUE: Multidetector CT imaging of the abdomen and pelvis was performed following the standard protocol without IV contrast. RADIATION DOSE REDUCTION: This exam was performed according to the departmental dose-optimization program which includes automated exposure control, adjustment of the mA and/or kV according to patient size and/or use of iterative reconstruction technique. COMPARISON:  01/17/2022 FINDINGS: Lower chest: Heart is enlarged in size. Coronary artery calcifications are seen. There is interval appearance of small right pleural effusion. There is homogeneous alveolar density with air bronchogram in the medial right lower lobe, possibly pneumonia. There is interval appearance of infiltrates in the posterior subpleural location in the right lower lobe. There is 8 x 5 mm pleural-based nodule in the right middle lobe. Area of this nodule was not included in the previous study. There is prominence of interstitial markings in the periphery of left lower lung fields suggesting scarring. Hepatobiliary: No focal abnormality is seen in the liver. Gallbladder is not distinctly seen. Pancreas: No focal abnormality is seen. Spleen: Unremarkable Adrenals/Urinary Tract: Adrenals are unremarkable. There is no hydronephrosis. There is possible 2 mm calculus in the lower pole of left kidney. There is 4 mm calcific density in the midportion of right kidney. There are few hyperdense cysts in the kidneys. There are few low-density simple appearing cysts in both kidneys largest measuring 1.9 cm. Stomach/Bowel: Stomach is unremarkable. Small bowel loops are not dilated. Appendix is not dilated. There is no  significant wall thickening in colon. Scattered diverticula are seen in colon without signs of focal acute diverticulitis. Vascular/Lymphatic: Extensive arterial calcifications are seen. There are subcentimeter nodes in the retroperitoneum and mesentery. Reproductive: Coarse calcifications are seen in the prostate. Other: There is no ascites or pneumoperitoneum. Musculoskeletal: There is first-degree anterolisthesis at L4-L5 level along with marked disc space narrowing. IMPRESSION: There is no evidence of intestinal obstruction or pneumoperitoneum. There is no hydronephrosis. Appendix is not dilated. There are possible small nonobstructing renal stones.  There are simple and hyperdense cysts in the kidneys. Diverticulosis of colon without signs of focal acute diverticulitis. There is interval appearance of small right pleural effusion. There is focal consolidation in the medial right lower lobe suggesting pneumonia. There is interval appearance of increased markings in the posterior right lower lung fields suggesting atelectasis/pneumonia. Follow-up studies until complete clearing occurs should be considered to rule out any underlying neoplastic process. There is 8 x 5 mm pleural-based nodule in the lateral segment of right middle lobe. Follow-up CT in 6 months may be considered. Other findings as described in the body of the report. Electronically Signed   By: Elmer Picker M.D.   On: 01/31/2022 15:03   [place] EKG SITE RITE  Result Date: 01/24/2022 If Site Rite image not attached, placement could not be confirmed due to current cardiac rhythm.   Microbiology: Results for orders placed or performed during the hospital encounter of 01/23/22  Culture, blood (routine x 2)     Status: None   Collection Time: 01/23/22  4:05 PM   Specimen: BLOOD  Result Value Ref Range Status   Specimen Description BLOOD BLOOD RIGHT WRIST  Final   Special Requests   Final    BOTTLES DRAWN AEROBIC AND ANAEROBIC Blood Culture  adequate volume   Culture   Final    NO GROWTH 5 DAYS Performed at [facility], [place] [address] [place], [place] [address]    Report Status 01/28/2022 FINAL  Final  Culture, blood (routine x 2)     Status: None   Collection Time: 01/23/22  4:30 PM   Specimen: BLOOD  Result Value Ref Range Status   Specimen Description BLOOD LEFT ANTECUBITAL  Final   Special Requests   Final    BOTTLES DRAWN AEROBIC AND ANAEROBIC Blood Culture adequate volume   Culture   Final    NO GROWTH 5 DAYS Performed at [facility], [place] [address], [place], [place] [address]    Report Status 01/28/2022 FINAL  Final  Resp Panel by RT-PCR (Flu A&B, Covid) Nasopharyngeal Swab     Status: None   Collection Time: 01/23/22  7:47 PM   Specimen: Nasopharyngeal Swab; Nasopharyngeal(NP) swabs in vial transport medium  Result Value Ref Range Status   SARS Coronavirus 2 by RT PCR NEGATIVE NEGATIVE Final    Comment: (NOTE) SARS-CoV-2 target nucleic acids are NOT DETECTED.  The SARS-CoV-2 RNA is generally detectable in upper respiratory specimens during the acute phase of infection. The lowest concentration of SARS-CoV-2 viral copies this assay can detect is 138 copies/mL. A negative result does not preclude SARS-Cov-2 infection and should not be used as the sole basis for treatment or other patient management decisions. A negative result may occur with  improper specimen collection/handling, submission of specimen other than nasopharyngeal swab, presence of viral mutation(s) within the areas targeted by this assay, and inadequate number of viral copies(<138 copies/mL). A negative result must be combined with clinical observations, patient history, and epidemiological information. The expected result is Negative.  Fact Sheet for Patients:  EntrepreneurPulse.com.au  Fact Sheet for Healthcare Providers:  IncredibleEmployment.be  This test is no t yet approved or  cleared by the [place] FDA and  has been authorized for detection and/or diagnosis of SARS-CoV-2 by FDA under an Emergency Use Authorization (EUA). This EUA will remain  in effect (meaning this test can be used) for the duration of the COVID-19 declaration under Section 564(b)(1) of the Act, 21 U.S.C.section 360bbb-3(b)(1),  unless the authorization is terminated  or revoked sooner.       Influenza A by PCR NEGATIVE NEGATIVE Final   Influenza B by PCR NEGATIVE NEGATIVE Final    Comment: (NOTE) The Xpert Xpress SARS-CoV-2/FLU/RSV plus assay is intended as an aid in the diagnosis of influenza from Nasopharyngeal swab specimens and should not be used as a sole basis for treatment. Nasal washings and aspirates are unacceptable for Xpert Xpress SARS-CoV-2/FLU/RSV testing.  Fact Sheet for Patients: EntrepreneurPulse.com.au  Fact Sheet for Healthcare Providers: IncredibleEmployment.be  This test is not yet approved or cleared by the [place] FDA and has been authorized for detection and/or diagnosis of SARS-CoV-2 by FDA under an Emergency Use Authorization (EUA). This EUA will remain in effect (meaning this test can be used) for the duration of the COVID-19 declaration under Section 564(b)(1) of the Act, 21 U.S.C. section 360bbb-3(b)(1), unless the authorization is terminated or revoked.  Performed at [facility], [place] [address], [place], [place] [address]     Labs: CBC: Recent Labs  Lab 01/29/22 0328 01/30/22 0155 01/31/22 0421 02/01/22 0340 02/02/22 0241  WBC 6.1 6.1 5.2 4.4 6.6  HGB 11.8* 12.1* 11.8* 12.5* 12.9*  HCT 35.3* 38.4* 35.7* 38.6* 40.7  MCV 78.4* 80.2 79.0* 80.2 80.4  PLT 153 196 206 227 412   Basic Metabolic Panel: Recent Labs  Lab 01/29/22 0328 01/30/22 0155 01/31/22 0421 02/01/22 0340 02/02/22 0241 02/03/22 0333 02/04/22 0316  NA 126* 128* 125* 127* 129* 128* 127*  K 3.7 3.8 4.0 4.2 3.8 3.6  4.8  CL 88* 87* 86* 87* 88* 87* 89*  CO2 _0 GLUCOSE 93 92 91 74 98 105* 105*  BUN 35* 29* 29* 30* 31* 31* 34*  CREATININE 1.98* 1.47* 1.45* 1.80* 1.47* 1.47* 1.57*  CALCIUM 9.3 9.2 9.2 9.5 9.4 9.8 9.8  MG 2.0 2.0 1.9 2.1  --  2.1  --    Liver Function Tests: No results for input(s): AST, ALT, ALKPHOS, BILITOT, PROT, ALBUMIN in the last 168 hours. CBG: Recent Labs  Lab 01/31/22 2101 02/01/22 0621 02/01/22 1111 02/01/22 1611 02/01/22 2132  GLUCAP 92 90 128* 84 114*    Discharge time spent: greater than 30 minutes.  Signed: Tawni Millers, MD [facility] 02/04/2022

## 2022-02-04 NOTE — TOC Transition Note (Signed)
Transition of Care [facility]) - CM/SW Discharge Note   Patient Details  Name: Lance Hicks MRN: [id number] Date of Birth: 03-Feb-1946  Transition of Care [facility]) CM/SW Contact:  Tresa Endo Phone Number: 02/04/2022, 11:16 AM   Clinical Narrative:    Patient will DC to: [facility] Anticipated DC date: 02/04/2022 Family notified: Pt HPOA Transport EB:BWNJ   Per MD patient ready for DC to [place]. RN to call report prior to discharge [phone]). RN, patient, patient's family, and facility notified of DC. Discharge Summary and FL2 sent to facility. DC packet on chart. Ambulance transport requested for patient.   CSW will sign off for now as social work intervention is no longer needed. Please consult [place] again if new needs arise.     Final next level of care: Skilled Nursing Facility Barriers to Discharge: Continued Medical Work up   Patient Goals and CMS Choice Patient states their goals for this hospitalization and ongoing recovery are:: Rehab CMS Medicare.gov Compare Post Acute Care list provided to:: Patient Choice offered to / list presented to : Patient  Discharge Placement                       Discharge Plan and Services In-house Referral: Clinical Social Work Discharge Planning Services: CM Consult Post Acute Care Choice: Skilled Nursing Facility                    HH Arranged: RN, PT [facility] Agency: [place] ([place]) Date [place]: 01/26/22 Time [place]: [phone] Representative spoke with at [place]: [place] ([place]) Interventions Food Insecurity Interventions: Intervention Not Indicated Financial Strain Interventions: Intervention Not Indicated Housing Interventions: Intervention Not Indicated   Readmission Risk Interventions No flowsheet data found.

## 2022-02-04 NOTE — TOC Transition Note (Incomplete Revision)
Transition of Care [facility]) - CM/SW Discharge Note   Patient Details  Name: Lance Hicks MRN: [id number] Date of Birth: 03-Feb-1946  Transition of Care [facility]) CM/SW Contact:  Tresa Endo Phone Number: 02/04/2022, 11:16 AM   Clinical Narrative:    Patient will DC to: [facility] Anticipated DC date: 02/04/2022 Family notified: Pt HPOA Transport EB:BWNJ   Per MD patient ready for DC to [facility] [place]. RN to call report prior to discharge [phone]). RN, patient, patient's family, and facility notified of DC. Discharge Summary and FL2 sent to facility. DC packet on chart. Ambulance transport requested for patient.   CSW will sign off for now as social work intervention is no longer needed. Please consult [place] again if new needs arise.     Final next level of care: Skilled Nursing Facility Barriers to Discharge: Continued Medical Work up   Patient Goals and CMS Choice Patient states their goals for this hospitalization and ongoing recovery are:: Rehab CMS Medicare.gov Compare Post Acute Care list provided to:: Patient Choice offered to / list presented to : Patient  Discharge Placement                       Discharge Plan and Services In-house Referral: Clinical Social Work Discharge Planning Services: CM Consult Post Acute Care Choice: Skilled Nursing Facility                    HH Arranged: RN, PT [facility] Agency: [place] ([place]) Date [place]: 01/26/22 Time [place]: [phone] Representative spoke with at [place]: [place] ([place]) Interventions Food Insecurity Interventions: Intervention Not Indicated Financial Strain Interventions: Intervention Not Indicated Housing Interventions: Intervention Not Indicated   Readmission Risk Interventions No flowsheet data found.

## 2022-02-04 NOTE — Progress Notes (Signed)
ANTICOAGULATION CONSULT NOTE - Follow Up Consult  Pharmacy Consult for Warfarin Indication: mechanical AVR  Allergies  Allergen Reactions   Augmentin [Amoxicillin-Pot Clavulanate] Anaphylaxis and Diarrhea    "Upset stomach"   Chicken Allergy Other (See Comments)   Pantoprazole Sodium Nausea Only    Gets gassy and starting itching like crazy Gets gassy and starting itching like crazy   Valium [Diazepam] Other (See Comments)    HA and Abd pain.   Tape Rash and Other (See Comments)    Surgical tape   Wound Dressing Adhesive Other (See Comments) and Rash    Surgical tape Surgical tape Surgical tape    Vital Signs: Temp: 97.7 F (36.5 C) (02/11 0500) Temp Source: Oral (02/11 0500) BP: 93/62 (02/11 0501) Pulse Rate: 52 (02/11 0501)  Labs: Recent Labs    02/02/22 0241 02/03/22 0333 02/04/22 0316  HGB 12.9*  --   --   HCT 40.7  --   --   PLT 220  --   --   LABPROT 22.1* 19.3* 22.0*  INR 1.9* 1.6* 1.9*  CREATININE 1.47* 1.47* 1.57*     Estimated Creatinine Clearance: 30.7 mL/min (A) (by C-G formula based on SCr of 1.57 mg/dL (H)).   Medications:  Scheduled:   aspirin EC  81 mg Oral q AM   docusate sodium  200 mg Oral BID   famotidine  10 mg Oral Daily   feeding supplement  237 mL Oral BID BM   ferrous sulfate  325 mg Oral QODAY   folic acid  1 mg Oral Daily   mouth rinse  15 mL Mouth Rinse BID   melatonin  6 mg Oral QHS   midodrine  15 mg Oral TID WC   multivitamin with minerals  1 tablet Oral Daily   pantoprazole  40 mg Oral Daily   polyethylene glycol  17 g Oral Daily   rosuvastatin  5 mg Oral Daily   Selexipag  200 mcg Oral BID   sodium chloride flush  3 mL Intravenous Q12H   spironolactone  25 mg Oral Daily   thiamine  100 mg Oral Daily   tiZANidine  2 mg Oral TID   torsemide  40 mg Oral Daily   traZODone  50 mg Oral QHS   Warfarin - Pharmacist Dosing Inpatient   Does not apply q1600    Assessment: 76 y.o. male with medical history significant of  chronic systolic CHF NYHA class III, HTN, CAD and aortic stenosis status post CABG and mechanical AVR on Coumadin, orthostatic hypotension, COPD and pulmonary hypertension, IIDM, PAF, liver cirrhosis secondary to alcohol abuse, presented with increasing leg swelling.  Home regimen per patient: warfarin 74m MonWed and warfarin 2.557mall other days.   INR now 1.9 after holding earlier this week and missing dose 2/08 for unclear reason. CBC stable on 2/9. No signs of bleeding.   Goal of Therapy:  INR goal 2.5-3.0 (per clinic note) Monitor platelets by anticoagulation protocol: Yes   Plan:  Give 39m37m1 - will need to watch dosing closely to not overshoot Daily INR, s/s bleeding  Thank you for allowing pharmacy to participate in this patient's care.  EmmLaurey ArrowharmD PGY1 Pharmacy Resident 02/04/2022  9:36 AM  Please check [email or web address] for unit-specific pharmacy phone numbers.

## 2022-02-04 NOTE — TOC Progression Note (Addendum)
Transition of Care [facility]) - Progression Note    Patient Details  Name: Lance Hicks MRN: [id number] Date of Birth: 09-Jan-1946  Transition of Care [facility]) CM/SW [place], [place] Phone Number: 02/04/2022, 10:23 AM  Clinical Narrative:    Broadus John with [facility] contacted CSW to offer a bed for pt, with the understanding that the family would bring in pt's uptravi and opsumit from home. CSW contacted pt POA Nate to inform him of the bed offer. Nate has agreed to get pt's meds from home and take them to [place]. CSW contacted Broadus John to inquire about weekend admissions, [facility] will accept pt today. CSW followed up with pt MD who confirmed that pt is medically stable today.   Md contacted CSW to inform CSW that pt blood pressure is low and would like to move the DC to tomorrow. CSW updated Broadus John and pt HPOA. CSW will continue to follow pt for DC planning needs.   Expected Discharge Plan: [place] Barriers to Discharge: Continued Medical Work up  Expected Discharge Plan and Services Expected Discharge Plan: [place] In-house Referral: Clinical Social Work Discharge Planning Services: CM Consult Post Acute Care Choice: [place] Living arrangements for the past 2 months: Single Family Home Expected Discharge Date: 01/31/22                         HH Arranged: RN, PT [place] Agency: [place] ([place]) Date [place]: 01/26/22 Time [place]: 1559 Representative spoke with at [place]: [place] ([place]) Interventions Food Insecurity Interventions: Intervention Not Indicated Financial Strain Interventions: Intervention Not Indicated Housing Interventions: Intervention Not Indicated  Readmission Risk Interventions No flowsheet data found.

## 2022-02-04 NOTE — Progress Notes (Signed)
Mobility Specialist Progress Note:   02/04/22 1500  Mobility  Range of Motion/Exercises Active;Right arm;Left arm  Level of Assistance Independent  Assistive Device None  Activity Response Tolerated well  $Mobility charge 1 Mobility   Pt declined OOB mobility at this time, promising that he will ambulate tomorrow. Pt performed multiple BUE band-exercises in bed. Pt tolerated well.  Nelta Numbers Mobility Specialist  Phone: [phone]

## 2022-02-05 DIAGNOSIS — R1313 Dysphagia, pharyngeal phase: Secondary | ICD-10-CM | POA: Diagnosis not present

## 2022-02-05 DIAGNOSIS — J849 Interstitial pulmonary disease, unspecified: Secondary | ICD-10-CM | POA: Diagnosis not present

## 2022-02-05 DIAGNOSIS — R531 Weakness: Secondary | ICD-10-CM | POA: Diagnosis not present

## 2022-02-05 DIAGNOSIS — Z7901 Long term (current) use of anticoagulants: Secondary | ICD-10-CM | POA: Diagnosis not present

## 2022-02-05 DIAGNOSIS — K219 Gastro-esophageal reflux disease without esophagitis: Secondary | ICD-10-CM | POA: Diagnosis not present

## 2022-02-05 DIAGNOSIS — I5043 Acute on chronic combined systolic (congestive) and diastolic (congestive) heart failure: Secondary | ICD-10-CM | POA: Diagnosis not present

## 2022-02-05 DIAGNOSIS — E43 Unspecified severe protein-calorie malnutrition: Secondary | ICD-10-CM | POA: Diagnosis not present

## 2022-02-05 DIAGNOSIS — R41841 Cognitive communication deficit: Secondary | ICD-10-CM | POA: Diagnosis not present

## 2022-02-05 DIAGNOSIS — F419 Anxiety disorder, unspecified: Secondary | ICD-10-CM | POA: Diagnosis not present

## 2022-02-05 DIAGNOSIS — M6259 Muscle wasting and atrophy, not elsewhere classified, multiple sites: Secondary | ICD-10-CM | POA: Diagnosis not present

## 2022-02-05 DIAGNOSIS — N179 Acute kidney failure, unspecified: Secondary | ICD-10-CM | POA: Diagnosis not present

## 2022-02-05 DIAGNOSIS — J449 Chronic obstructive pulmonary disease, unspecified: Secondary | ICD-10-CM | POA: Diagnosis not present

## 2022-02-05 DIAGNOSIS — Z7982 Long term (current) use of aspirin: Secondary | ICD-10-CM | POA: Diagnosis not present

## 2022-02-05 DIAGNOSIS — Z7401 Bed confinement status: Secondary | ICD-10-CM | POA: Diagnosis not present

## 2022-02-05 DIAGNOSIS — Z79899 Other long term (current) drug therapy: Secondary | ICD-10-CM | POA: Diagnosis not present

## 2022-02-05 DIAGNOSIS — I517 Cardiomegaly: Secondary | ICD-10-CM | POA: Diagnosis not present

## 2022-02-05 DIAGNOSIS — Z736 Limitation of activities due to disability: Secondary | ICD-10-CM | POA: Diagnosis not present

## 2022-02-05 DIAGNOSIS — J99 Respiratory disorders in diseases classified elsewhere: Secondary | ICD-10-CM | POA: Diagnosis not present

## 2022-02-05 DIAGNOSIS — K7030 Alcoholic cirrhosis of liver without ascites: Secondary | ICD-10-CM | POA: Diagnosis not present

## 2022-02-05 DIAGNOSIS — R001 Bradycardia, unspecified: Secondary | ICD-10-CM | POA: Diagnosis not present

## 2022-02-05 DIAGNOSIS — Z952 Presence of prosthetic heart valve: Secondary | ICD-10-CM | POA: Diagnosis not present

## 2022-02-05 DIAGNOSIS — Z881 Allergy status to other antibiotic agents status: Secondary | ICD-10-CM | POA: Diagnosis not present

## 2022-02-05 DIAGNOSIS — I4819 Other persistent atrial fibrillation: Secondary | ICD-10-CM | POA: Diagnosis not present

## 2022-02-05 DIAGNOSIS — I272 Pulmonary hypertension, unspecified: Secondary | ICD-10-CM | POA: Diagnosis not present

## 2022-02-05 DIAGNOSIS — Z7984 Long term (current) use of oral hypoglycemic drugs: Secondary | ICD-10-CM | POA: Diagnosis not present

## 2022-02-05 DIAGNOSIS — I509 Heart failure, unspecified: Secondary | ICD-10-CM | POA: Diagnosis not present

## 2022-02-05 DIAGNOSIS — E785 Hyperlipidemia, unspecified: Secondary | ICD-10-CM | POA: Diagnosis not present

## 2022-02-05 DIAGNOSIS — Z888 Allergy status to other drugs, medicaments and biological substances status: Secondary | ICD-10-CM | POA: Diagnosis not present

## 2022-02-05 DIAGNOSIS — E119 Type 2 diabetes mellitus without complications: Secondary | ICD-10-CM | POA: Diagnosis not present

## 2022-02-05 DIAGNOSIS — J90 Pleural effusion, not elsewhere classified: Secondary | ICD-10-CM | POA: Diagnosis not present

## 2022-02-05 DIAGNOSIS — M1990 Unspecified osteoarthritis, unspecified site: Secondary | ICD-10-CM | POA: Diagnosis not present

## 2022-02-05 DIAGNOSIS — R278 Other lack of coordination: Secondary | ICD-10-CM | POA: Diagnosis not present

## 2022-02-05 DIAGNOSIS — I959 Hypotension, unspecified: Secondary | ICD-10-CM | POA: Diagnosis not present

## 2022-02-05 DIAGNOSIS — I130 Hypertensive heart and chronic kidney disease with heart failure and stage 1 through stage 4 chronic kidney disease, or unspecified chronic kidney disease: Secondary | ICD-10-CM | POA: Diagnosis not present

## 2022-02-05 DIAGNOSIS — I2510 Atherosclerotic heart disease of native coronary artery without angina pectoris: Secondary | ICD-10-CM | POA: Diagnosis not present

## 2022-02-05 DIAGNOSIS — I4891 Unspecified atrial fibrillation: Secondary | ICD-10-CM | POA: Diagnosis not present

## 2022-02-05 DIAGNOSIS — E875 Hyperkalemia: Secondary | ICD-10-CM | POA: Diagnosis not present

## 2022-02-05 DIAGNOSIS — T481X5A Adverse effect of skeletal muscle relaxants [neuromuscular blocking agents], initial encounter: Secondary | ICD-10-CM | POA: Diagnosis not present

## 2022-02-05 DIAGNOSIS — Z880 Allergy status to penicillin: Secondary | ICD-10-CM | POA: Diagnosis not present

## 2022-02-05 DIAGNOSIS — R279 Unspecified lack of coordination: Secondary | ICD-10-CM | POA: Diagnosis not present

## 2022-02-05 DIAGNOSIS — Z954 Presence of other heart-valve replacement: Secondary | ICD-10-CM | POA: Diagnosis not present

## 2022-02-05 DIAGNOSIS — D649 Anemia, unspecified: Secondary | ICD-10-CM | POA: Diagnosis not present

## 2022-02-05 DIAGNOSIS — I270 Primary pulmonary hypertension: Secondary | ICD-10-CM | POA: Diagnosis not present

## 2022-02-05 DIAGNOSIS — I4510 Unspecified right bundle-branch block: Secondary | ICD-10-CM | POA: Diagnosis not present

## 2022-02-05 DIAGNOSIS — G894 Chronic pain syndrome: Secondary | ICD-10-CM | POA: Diagnosis not present

## 2022-02-05 DIAGNOSIS — N183 Chronic kidney disease, stage 3 unspecified: Secondary | ICD-10-CM | POA: Diagnosis not present

## 2022-02-05 LAB — BASIC METABOLIC PANEL
Anion gap: 12 (ref 5–15)
BUN: 33 mg/dL — ABNORMAL HIGH (ref 8–23)
CO2: 28 mmol/L (ref 22–32)
Calcium: 9.9 mg/dL (ref 8.9–10.3)
Chloride: 86 mmol/L — ABNORMAL LOW (ref 98–111)
Creatinine, Ser: 1.52 mg/dL — ABNORMAL HIGH (ref 0.61–1.24)
GFR, Estimated: 48 mL/min — ABNORMAL LOW (ref >60)
Glucose, Bld: 95 mg/dL (ref 70–99)
Potassium: 4.5 mmol/L (ref 3.5–5.1)
Sodium: 126 mmol/L — ABNORMAL LOW (ref 135–145)

## 2022-02-05 LAB — PROTIME-INR
INR: 2.8 — ABNORMAL HIGH (ref 0.8–1.2)
Prothrombin Time: 29.6 seconds — ABNORMAL HIGH (ref 11.4–15.2)

## 2022-02-05 LAB — GLUCOSE, CAPILLARY: Glucose-Capillary: 101 mg/dL — ABNORMAL HIGH (ref 70–99)

## 2022-02-05 MED ORDER — MENTHOL 3 MG MT LOZG
1.0000 | LOZENGE | OROMUCOSAL | Status: DC | PRN
  Administered 2022-02-05: 3 mg via ORAL
  Filled 2022-02-05: qty 9

## 2022-02-05 MED ORDER — WARFARIN SODIUM 1 MG PO TABS
1.0000 mg | ORAL_TABLET | Freq: Once | ORAL | Status: DC
  Filled 2022-02-05: qty 1

## 2022-02-05 NOTE — TOC Transition Note (Signed)
Transition of Care [facility]) - CM/SW Discharge Note   Patient Details  Name: Lance Hicks MRN: [id number] Date of Birth: 12-07-1946  Transition of Care [facility]) CM/SW Contact:  Bary Castilla, LCSW Phone Number: [phone] 02/05/2022, 9:41 AM   Clinical Narrative:    Patient will DC to:?[place] date:?02/05/22 Family notified:?POA Transport by: Corey Harold   Per MD patient ready for DC to [facility] [place], patient, patient's family, and facility notified of DC. Discharge Summary sent to facility. RN given number for report  [phone] room 311. DC packet on chart. Ambulance transport requested for patient.   CSW signing off.   [place], [place] [phone]    Final next level of care: Skilled Nursing Facility Barriers to Discharge: Continued Medical Work up   Patient Goals and CMS Choice Patient states their goals for this hospitalization and ongoing recovery are:: Rehab CMS Medicare.gov Compare Post Acute Care list provided to:: Patient Choice offered to / list presented to : Patient  Discharge Placement                       Discharge Plan and Services In-house Referral: Clinical Social Work Discharge Planning Services: CM Consult Post Acute Care Choice: Skilled Nursing Facility                    HH Arranged: RN, PT [facility] Agency: [place] ([place]) Date [place]: 01/26/22 Time [place]: [phone] Representative spoke with at [place]: [place] ([place]) Interventions Food Insecurity Interventions: Intervention Not Indicated Financial Strain Interventions: Intervention Not Indicated Housing Interventions: Intervention Not Indicated   Readmission Risk Interventions No flowsheet data found.

## 2022-02-05 NOTE — Progress Notes (Addendum)
Patient's blood pressure has improved, he is tolerating po well, no dyspnea or edema, chest pain has been controlled.  BP 100/62 (BP Location: Left Arm)    Pulse 71    Temp 97.7 F (36.5 C) (Oral)    Resp 18    Ht _0  (1.753 m)    Wt 53.4 kg    SpO2 93%    BMI 17.39 kg/m   Awake and alert ENT with mild pallor Cardiovascular with S1 and S2 present and rhythmic, no gallops No JVD No lower extremity edema. Respiratory with no wheezing or rhonchi Abdomen soft and non tender  Plan to transfer to SNF today, close follow up of serum Na as outpatient. Patient will benefit from continue physical therapy at SNF.  Patient has a poor prognosis, he is candidate for hospice, but at this point he would like to try reconditioning at SNF.

## 2022-02-05 NOTE — TOC Initial Note (Signed)
Transition of Care [facility]) - Initial/Assessment Note    Patient Details  Name: Lance Hicks MRN: [id number] Date of Birth: 09-28-1946  Transition of Care [facility]) CM/SW Contact:    Bary Castilla, LCSW Phone Number:[phone] 02/05/2022, 10:24 AM  Clinical Narrative:                  CSW met with patient to discuss PT recommendation of a SNF. Patient was aware of recommendation and in agreement with going to a ST SNF. CSW discussed the SNF process.CSW provided patient with medicare.gov rating list.  Patient gave CSW permission to fax referrals out to local facilities.CSW answered questions about the SNF process and the next steps in the process.   Pt explained that his wife is at [facility] therefore that is his first choice of facilities.  TOC team will continue to assist with discharge planning needs.   Expected Discharge Plan: Skilled Nursing Facility Barriers to Discharge: Continued Medical Work up   Patient Goals and CMS Choice Patient states their goals for this hospitalization and ongoing recovery are:: Rehab CMS Medicare.gov Compare Post Acute Care list provided to:: Patient Choice offered to / list presented to : Patient  Expected Discharge Plan and Services Expected Discharge Plan: [place] In-house Referral: Clinical Social Work Discharge Planning Services: CM Consult Post Acute Care Choice: [place] Living arrangements for the past 2 months: [place] Expected Discharge Date: 02/05/22                         HH Arranged: RN, PT [place] Agency: [place] ([place]) Date [place]: 01/26/22 Time [place]: 1559 Representative spoke with at [place]: [place] Arrangements/Services Living arrangements for the past 2 months: [place] Lives with:: Self Patient language and need for interpreter reviewed:: Yes Do you feel safe going back to the place where you  live?: Yes      Need for Family Participation in Patient Care: Yes (Comment) Care giver support system in place?: Yes (comment)   Criminal Activity/Legal Involvement Pertinent to Current Situation/Hospitalization: No - Comment as needed  Activities of Daily Living Home Assistive Devices/Equipment: Gilford Rile (specify type) ADL Screening (condition at time of admission) Patient's cognitive ability adequate to safely complete daily activities?: Yes Is the patient deaf or have difficulty hearing?: Yes Does the patient have difficulty seeing, even when wearing glasses/contacts?: No Does the patient have difficulty concentrating, remembering, or making decisions?: No Patient able to express need for assistance with ADLs?: Yes Does the patient have difficulty dressing or bathing?: No Independently performs ADLs?: Yes (appropriate for developmental age) Does the patient have difficulty walking or climbing stairs?: Yes Weakness of Legs: Both Weakness of Arms/Hands: None  Permission Sought/Granted Permission sought to share information with : Facility Sport and exercise psychologist, Family Supports Permission granted to share information with : Yes, Verbal Permission Granted  Share Information with NAME: Illene Bolus (Friend)   [phone]  Permission granted to share info w AGENCY: SNF  Permission granted to share info w Relationship: Johnson,Nate (Friend)   [phone]  Permission granted to share info w Contact Information: Illene Bolus (Friend)   [phone]  Emotional Assessment Appearance:: Appears stated age Attitude/Demeanor/Rapport: Engaged Affect (typically observed): Pleasant Orientation: : Oriented to Self, Oriented to Place, Oriented to  Time, Oriented to Situation Alcohol / Substance Use: Not Applicable Psych Involvement: No (comment)  Admission diagnosis:  Pulmonary hypertension ([place]) [  I95.18] Acute systolic CHF (congestive heart failure) (HCC) [I50.21] Acute on chronic congestive  heart failure, unspecified heart failure type [facility]) [I50.9] Patient Active Problem List   Diagnosis Date Noted   Abdominal pain 01/31/2022   Protein-calorie malnutrition, severe ([place]) 01/28/2022   Physical deconditioning 01/25/2022   GERD (gastroesophageal reflux disease) 01/25/2022   Iron deficiency anemia 01/25/2022   Alcohol abuse 01/25/2022   Acute kidney injury superimposed on chronic kidney disease ([place]) 84/16/6063   Acute systolic CHF (congestive heart failure) ([place]) 01/23/2022   Acute on chronic combined systolic and diastolic CHF (congestive heart failure) ([place]) 11/03/2021   ILD (interstitial lung disease) ([place]) 08/04/2021   Fall 06/21/2021   Asthma    Pneumonia due to COVID-19 virus 02/07/2021   Cirrhosis of liver not due to alcohol ([place]) 07/01/2019   Anemia 07/01/2019   Hypotension, chronic 06/06/2018   Enterococcal bacteremia    Pulmonary edema    Strain of deltoid muscle, initial encounter    Pulmonary arterial hypertension ([place]) 02/20/2018   Hypokalemia due to excessive renal loss of potassium 02/18/2018   Left ureteral stone 01/23/2018   Anticoagulated on Coumadin 01/04/2018   Localized edema 01/04/2018   Leukocytoclastic vasculitis ([place]) 10/01/2017   Hypersensitivity angiitis ([place]) 10/01/2017   Maculopapular rash 09/03/2017   Epidermoid cyst of skin 08/24/2017   Bilateral pleural effusion 08/09/2017   Pleural effusion, bilateral 08/09/2017   Supratherapeutic INR 07/26/2017   International normalized ratio (INR) raised 07/26/2017   Microscopic hematuria 06/20/2017   Asymptomatic microscopic hematuria 06/20/2017   Atrial flutter ([place]) 03/29/2017   Coronary artery disease involving native coronary artery of native heart with angina pectoris ([place]) 03/29/2017   H/O maze procedure 03/29/2017   History of coronary artery bypass graft 03/29/2017   Hypertensive heart disease with heart failure ([place]) 03/29/2017   Hyperlipidemia 03/29/2017   Coronary arteriosclerosis  in native artery 03/29/2017   Long term (current) use of anticoagulants 03/29/2017   Non-sustained ventricular tachycardia 03/29/2017   Cough 10/17/2016   Pulmonary nodules 06/12/2016   Multiple nodules of lung 06/12/2016   COPD (chronic obstructive pulmonary disease) ([place]) 06/12/2016   Anxiety 05/10/2016   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   Allergic rhinitis 04/28/2016   [name] 03/20/2016   S/P AVR (aortic valve replacement) 03/20/2016   Dyspnea 02/01/2016   Syncope and collapse 02/01/2016   Lumbar radicular pain 01/19/2016   Lumbar radiculopathy 01/19/2016   Backache 12/14/2015   Essential hypertension 12/14/2015   Chronic midline back pain 12/14/2015   Chronic prostatitis 07/23/2015   Nephrolithiasis 07/23/2015   Chronic atrial fibrillation ([place]) 07/23/2015   PCP:  [name], PA-C Pharmacy:   [facility] [place], [address] AT [address] [place] [address] Phone: [phone] Fax: [phone]     Social Determinants of Health (SDOH) Interventions Food Insecurity Interventions: Intervention Not Indicated Financial Strain Interventions: Intervention Not Indicated Housing Interventions: Intervention Not Indicated  Readmission Risk Interventions No flowsheet data found.

## 2022-02-06 ENCOUNTER — Other Ambulatory Visit (HOSPITAL_COMMUNITY): Payer: Self-pay

## 2022-02-06 DIAGNOSIS — M6259 Muscle wasting and atrophy, not elsewhere classified, multiple sites: Secondary | ICD-10-CM | POA: Diagnosis not present

## 2022-02-06 DIAGNOSIS — I4891 Unspecified atrial fibrillation: Secondary | ICD-10-CM | POA: Diagnosis not present

## 2022-02-06 DIAGNOSIS — I5043 Acute on chronic combined systolic (congestive) and diastolic (congestive) heart failure: Secondary | ICD-10-CM | POA: Diagnosis not present

## 2022-02-06 DIAGNOSIS — I130 Hypertensive heart and chronic kidney disease with heart failure and stage 1 through stage 4 chronic kidney disease, or unspecified chronic kidney disease: Secondary | ICD-10-CM | POA: Diagnosis not present

## 2022-02-07 ENCOUNTER — Inpatient Hospital Stay: Payer: Medicare Other | Admitting: Medical

## 2022-02-08 DIAGNOSIS — I5043 Acute on chronic combined systolic (congestive) and diastolic (congestive) heart failure: Secondary | ICD-10-CM | POA: Diagnosis not present

## 2022-02-08 DIAGNOSIS — G894 Chronic pain syndrome: Secondary | ICD-10-CM | POA: Diagnosis not present

## 2022-02-08 DIAGNOSIS — I4891 Unspecified atrial fibrillation: Secondary | ICD-10-CM | POA: Diagnosis not present

## 2022-02-08 DIAGNOSIS — I272 Pulmonary hypertension, unspecified: Secondary | ICD-10-CM | POA: Diagnosis not present

## 2022-02-10 ENCOUNTER — Telehealth (HOSPITAL_COMMUNITY): Payer: Self-pay

## 2022-02-10 ENCOUNTER — Telehealth: Payer: Self-pay | Admitting: Medical

## 2022-02-10 ENCOUNTER — Other Ambulatory Visit (HOSPITAL_COMMUNITY): Payer: Self-pay

## 2022-02-10 MED ORDER — BUSPIRONE HCL 7.5 MG PO TABS: 7.5000 mg | ORAL_TABLET | Freq: Two times a day (BID) | ORAL | 0 refills | Status: DC

## 2022-02-10 NOTE — Telephone Encounter (Signed)
Rx buspar sent to pt pharmacy.  [name], PA-C

## 2022-02-10 NOTE — Telephone Encounter (Signed)
Pt states he has been in the hospital for swallowing acid and is currently at [facility]. He is requesting anxiety medication, but does not remember what is was called. Hospital cannot give him medication because they don't know what he was prescribed and doesn't want to give him the wrong thing. Please advise.   [facility] [address], [place], [place] [address] [phone]

## 2022-02-10 NOTE — Telephone Encounter (Signed)
Transitions of Care Pharmacy   Call attempted for a pharmacy transitions of care follow-up, patient is currently in hospital, following up with MD about patient needing a script for anxiety

## 2022-02-12 DIAGNOSIS — Z881 Allergy status to other antibiotic agents status: Secondary | ICD-10-CM | POA: Diagnosis not present

## 2022-02-12 DIAGNOSIS — Z880 Allergy status to penicillin: Secondary | ICD-10-CM | POA: Diagnosis not present

## 2022-02-12 DIAGNOSIS — Z7901 Long term (current) use of anticoagulants: Secondary | ICD-10-CM | POA: Diagnosis not present

## 2022-02-12 DIAGNOSIS — I272 Pulmonary hypertension, unspecified: Secondary | ICD-10-CM | POA: Diagnosis not present

## 2022-02-12 DIAGNOSIS — Z954 Presence of other heart-valve replacement: Secondary | ICD-10-CM | POA: Diagnosis not present

## 2022-02-12 DIAGNOSIS — Z888 Allergy status to other drugs, medicaments and biological substances status: Secondary | ICD-10-CM | POA: Diagnosis not present

## 2022-02-12 DIAGNOSIS — N179 Acute kidney failure, unspecified: Secondary | ICD-10-CM | POA: Diagnosis not present

## 2022-02-12 DIAGNOSIS — E875 Hyperkalemia: Secondary | ICD-10-CM | POA: Diagnosis not present

## 2022-02-12 DIAGNOSIS — I517 Cardiomegaly: Secondary | ICD-10-CM | POA: Diagnosis not present

## 2022-02-12 DIAGNOSIS — Z7984 Long term (current) use of oral hypoglycemic drugs: Secondary | ICD-10-CM | POA: Diagnosis not present

## 2022-02-12 DIAGNOSIS — I4510 Unspecified right bundle-branch block: Secondary | ICD-10-CM | POA: Diagnosis not present

## 2022-02-12 DIAGNOSIS — Z7982 Long term (current) use of aspirin: Secondary | ICD-10-CM | POA: Diagnosis not present

## 2022-02-12 DIAGNOSIS — I4819 Other persistent atrial fibrillation: Secondary | ICD-10-CM | POA: Diagnosis not present

## 2022-02-12 DIAGNOSIS — I509 Heart failure, unspecified: Secondary | ICD-10-CM | POA: Diagnosis not present

## 2022-02-12 DIAGNOSIS — I4891 Unspecified atrial fibrillation: Secondary | ICD-10-CM | POA: Diagnosis not present

## 2022-02-12 DIAGNOSIS — Z952 Presence of prosthetic heart valve: Secondary | ICD-10-CM | POA: Diagnosis not present

## 2022-02-12 DIAGNOSIS — I2510 Atherosclerotic heart disease of native coronary artery without angina pectoris: Secondary | ICD-10-CM | POA: Diagnosis not present

## 2022-02-12 DIAGNOSIS — F419 Anxiety disorder, unspecified: Secondary | ICD-10-CM | POA: Diagnosis not present

## 2022-02-12 DIAGNOSIS — T481X5A Adverse effect of skeletal muscle relaxants [neuromuscular blocking agents], initial encounter: Secondary | ICD-10-CM | POA: Diagnosis not present

## 2022-02-12 DIAGNOSIS — Z79899 Other long term (current) drug therapy: Secondary | ICD-10-CM | POA: Diagnosis not present

## 2022-02-12 DIAGNOSIS — I959 Hypotension, unspecified: Secondary | ICD-10-CM | POA: Diagnosis not present

## 2022-02-12 DIAGNOSIS — R001 Bradycardia, unspecified: Secondary | ICD-10-CM | POA: Diagnosis not present

## 2022-02-12 DIAGNOSIS — M1990 Unspecified osteoarthritis, unspecified site: Secondary | ICD-10-CM | POA: Diagnosis not present

## 2022-02-12 DIAGNOSIS — E119 Type 2 diabetes mellitus without complications: Secondary | ICD-10-CM | POA: Diagnosis not present

## 2022-02-12 DIAGNOSIS — J449 Chronic obstructive pulmonary disease, unspecified: Secondary | ICD-10-CM | POA: Diagnosis not present

## 2022-02-13 ENCOUNTER — Other Ambulatory Visit (HOSPITAL_BASED_OUTPATIENT_CLINIC_OR_DEPARTMENT_OTHER): Payer: Self-pay

## 2022-02-13 DIAGNOSIS — I4891 Unspecified atrial fibrillation: Secondary | ICD-10-CM

## 2022-02-13 DIAGNOSIS — I272 Pulmonary hypertension, unspecified: Secondary | ICD-10-CM

## 2022-02-13 DIAGNOSIS — I2510 Atherosclerotic heart disease of native coronary artery without angina pectoris: Secondary | ICD-10-CM

## 2022-02-13 DIAGNOSIS — Z7901 Long term (current) use of anticoagulants: Secondary | ICD-10-CM

## 2022-02-13 DIAGNOSIS — J449 Chronic obstructive pulmonary disease, unspecified: Secondary | ICD-10-CM

## 2022-02-13 DIAGNOSIS — Z952 Presence of prosthetic heart valve: Secondary | ICD-10-CM

## 2022-02-13 NOTE — Telephone Encounter (Signed)
Pt called and notified , stating doing ok

## 2022-02-14 ENCOUNTER — Telehealth: Payer: Self-pay

## 2022-02-14 DIAGNOSIS — I2510 Atherosclerotic heart disease of native coronary artery without angina pectoris: Secondary | ICD-10-CM | POA: Diagnosis not present

## 2022-02-14 DIAGNOSIS — J449 Chronic obstructive pulmonary disease, unspecified: Secondary | ICD-10-CM | POA: Diagnosis not present

## 2022-02-14 DIAGNOSIS — I4891 Unspecified atrial fibrillation: Secondary | ICD-10-CM | POA: Diagnosis not present

## 2022-02-14 DIAGNOSIS — I272 Pulmonary hypertension, unspecified: Secondary | ICD-10-CM | POA: Diagnosis not present

## 2022-02-14 NOTE — Telephone Encounter (Signed)
Pt overdue for anticoagulation visit. Called pt's daughter , Jeani Hawking to follow-up on pt and schedule an anticoagulation appt. Jeani Hawking stated pt was discharged from [facility] on 02/05/22 and was sent to [place] facility; however, pt is now back in the hospital at [facility]. I explained to [place] that if pt is discharged home and continues to take Warfarin, he will need to schedule a anticoagulation appt. She verbalized understanding and was very grateful for the follow-up call and reminder.

## 2022-02-16 ENCOUNTER — Telehealth: Payer: Self-pay

## 2022-02-16 NOTE — Telephone Encounter (Signed)
Received phone call from Sabino Snipes) clinical pharmacist at [place] is currently admitted there with an elevated INR- she wanted to check to see what his current Coumadin schedule is at this time. Informed that it looks like cardiology in [name] manages but that I would attempt to help. After reviewed med list and hospital discharge note from 1/30 admission I was unable to give Coumadin schedule. I gave her the number to [facility] in [place].

## 2022-02-16 NOTE — Telephone Encounter (Addendum)
Pt discharged from [facility]. Received message from Donnel Saxon, Pharmacist stating pt's Warfarin dosage during admission:  02/08/22 Coumadin held 02/09/22 UNSURE OF DOSE 02/10/22 UNSURE OF DOSE  02/11/22 INR 2.1 02/12/22 INR 2.1 Coumadin 2 mg 02/13/22 INR 2.1 Coumadin 2.5 mg 02/14/22 INR 2.5 Coumadin 2.5 mg 02/15/22 INR 2.7 Coumadin 2.5 mg 02/16/22 INR 3.2 pt discharged home and no coumadin given  Pt's Warfarin dose at [facility], prior to admission:  2.52m daily EXCEPT 548mon Tuesdays and Thursdays.   Called pt, no answer. Called and spoke with NaIllene BolusHCLexingtonInstructed for pt to start taking 2.54m18m0.5 tablet) daily and made an appt for INR to be checked at anticoagulation clinic [place] 02/21/22. Mr JohWynetta Emeryrbalized understanding.

## 2022-02-16 NOTE — Telephone Encounter (Signed)
Donnel Saxon Pharmacist at [facility] called to say that the pt was admitted to [facility] on  02/11/22 INR 2.1 02/12/22 INR 2.1 Coumadin 2 mg 02/13/22 INR 2.1 Coumadin 2.5 mg 02/14/22 INR 2.5 Coumadin 2.5 mg 02/15/22 INR 2.7 Coumadin 2.5 mg 02/16/22 INR 3.2 pt discharged home and no coumadin given.

## 2022-02-16 NOTE — Telephone Encounter (Signed)
Pt discharged from [facility]. Received message from Donnel Saxon, Pharmacist stating pt's Warfarin dosage during admission:  02/11/22 INR 2.1 02/12/22 INR 2.1 Coumadin 2 mg 02/13/22 INR 2.1 Coumadin 2.5 mg 02/14/22 INR 2.5 Coumadin 2.5 mg 02/15/22 INR 2.7 Coumadin 2.5 mg 02/16/22 INR 3.2 pt discharged home and no coumadin given   Pt's Warfarin dose at [facility], prior to admission:  2.16m daily EXCEPT 594mon Tuesdays and Thursdays.    Called pt, no answer. Called and spoke with NaIllene BolusHCLa ConnerInstructed for pt to start taking 2.28m51m0.5 tablet) daily and made an appt for INR to be checked at anticoagulation clinic in [place] 02/21/22. Mr JohWynetta Emeryrbalized understanding.

## 2022-02-17 ENCOUNTER — Telehealth: Payer: Self-pay | Admitting: Medical

## 2022-02-17 MED ORDER — BUSPIRONE HCL 7.5 MG PO TABS: 7.5000 mg | ORAL_TABLET | Freq: Two times a day (BID) | ORAL | 0 refills | Status: DC

## 2022-02-17 NOTE — Telephone Encounter (Signed)
Rx sent 

## 2022-02-17 NOTE — Telephone Encounter (Signed)
Medication:  busPIRone (BUSPAR) 7.5 MG tablet [[id number]]     Has the patient contacted their pharmacy? No. (If no, request that the patient contact the pharmacy for the refill.) (If yes, when and what did the pharmacy advise?)     Preferred Pharmacy (with phone number or street name):  [facility] [place], [address]  [address], [place] [address]  Phone:  [phone]  Fax:  [phone]     Agent: Please be advised that RX refills may take up to 3 business days. We ask that you follow-up with your pharmacy.

## 2022-02-20 ENCOUNTER — Telehealth: Payer: Self-pay

## 2022-02-20 NOTE — Telephone Encounter (Signed)
Called and lmom the pt to move appt since he left [place] a msg saying he needed to reschedule

## 2022-02-20 NOTE — Telephone Encounter (Signed)
Called received from [place] at [facility]. Asking for verbal orders to initiate PT and nursing services for patient. Ok per [employer] ok to start PT and nursing.  Patient to keep appointment for 02-23-22. Per Richardson Landry patient's weight is 115 history of heart failure.

## 2022-02-21 ENCOUNTER — Other Ambulatory Visit: Payer: Self-pay

## 2022-02-21 ENCOUNTER — Ambulatory Visit (INDEPENDENT_AMBULATORY_CARE_PROVIDER_SITE_OTHER): Payer: Medicare Other | Admitting: Gastroenterology

## 2022-02-21 ENCOUNTER — Telehealth: Payer: Self-pay | Admitting: Gastroenterology

## 2022-02-21 ENCOUNTER — Ambulatory Visit (INDEPENDENT_AMBULATORY_CARE_PROVIDER_SITE_OTHER): Payer: Medicare Other

## 2022-02-21 ENCOUNTER — Telehealth: Payer: Self-pay | Admitting: Medical

## 2022-02-21 VITALS — BP 122/72 | HR 87 | Ht 69.0 in | Wt 115.0 lb

## 2022-02-21 DIAGNOSIS — I482 Chronic atrial fibrillation, unspecified: Secondary | ICD-10-CM

## 2022-02-21 DIAGNOSIS — D509 Iron deficiency anemia, unspecified: Secondary | ICD-10-CM | POA: Diagnosis not present

## 2022-02-21 DIAGNOSIS — K7460 Unspecified cirrhosis of liver: Secondary | ICD-10-CM | POA: Diagnosis not present

## 2022-02-21 DIAGNOSIS — Z5181 Encounter for therapeutic drug level monitoring: Secondary | ICD-10-CM | POA: Diagnosis not present

## 2022-02-21 DIAGNOSIS — K219 Gastro-esophageal reflux disease without esophagitis: Secondary | ICD-10-CM

## 2022-02-21 LAB — POCT INR: INR: 2.1 (ref 2.0–3.0)

## 2022-02-21 MED ORDER — RABEPRAZOLE SODIUM 20 MG PO TBEC: 20.0000 mg | DELAYED_RELEASE_TABLET | Freq: Every day | ORAL | 3 refills | Status: DC

## 2022-02-21 NOTE — Telephone Encounter (Signed)
FYI

## 2022-02-21 NOTE — Telephone Encounter (Signed)
Patient called said that [facility] does not have his script from today's visit.

## 2022-02-21 NOTE — Progress Notes (Signed)
Chief Complaint: GERD  Referring Provider:  [name], PA-C      ASSESSMENT AND PLAN;   #1. IDA with H+ stools (resolved) on Coumadin.  Last Hb 02/02/2022 12.9, Nl MCV. Not a candidate for endoscopic procedures per cardiology/also frail pulmonary status. Neg NCCT AP 01/2022  #2. GERD (s/p EGD 06/2017 - at [facility], [place] -report reviewed, mild gastritis with neg Bx. No varices). Had allergic reaction to protonix.   #2.  Liver Cirrhosis (likely cardiac liver cirrhosis with congestive hepatomegaly). No ETOH. No evidence of portal hypertension.  Mild ascites likely d/t right heart failure with cor pulmonale and pulmonary HTN. Neg [place] with doppler 04/2018. Nl AFP 3.2 04/2019. Alb 4.1 05/2019. [place] 06/06/2019-congestive hepatomegaly, prominent hepatic veins and IVC d/t elevated right heart pressures.  #3. Rectal bleeding with constipation d/t internal hemorrhoids. Neg hemoccult. Neg colon in [place]  #4  Anemia of chronic disease.  No overt GI bleeding.  #5. Co-morbid conditions include COPD, RV failure with pulmonary HTN/cor pulmonale, Afib with Nl EF 55% (07/2019), CAD s/p CABG, mechanical AVR on Coumadin, chronic respiratory failure, HLD, HTN.  Leuko-classic vasculitis.     Plan: -Continue colace 2/day  -Add Miralax 17g po QD -Restart Aciphex 88m po qd #30, 11 refills. -FU appt with Dr MBettina Gavia-FU with pulm (recent pneumonia, weight loss, R/O underlying malignancy) -No egd/colon unless active GI bleed and then also, in hosiptal setting ONLY.  Very frail cardiopulmonary status. -Trend  CBC -Continue fluid and salt restricted diet as he has been doing.      HPI:    Lance Hicks a 76y.o. 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  For  follow-up visit  Very complicated. No active bleeding Hb 2/9 12.9 MCV 80 Contipation with occ BRBPR when he starins (likely hoids) Coughs up white phelem with some blood in AM- not much therafter Neg NCCT A/P Jan 31, 2022  SOB even when he talks. Recent pneumonia 01/2022- antibiotice- ??underlying lung mass  Not a candidate for endoscpies per cardiology and frail pulm status  He stopped Aciphex on his own.  Lost wt- not good appetite  Wt Readings from Last 3 Encounters:  02/21/22 115 lb (52.2 kg)  02/05/22 117 lb 11.6 oz (53.4 kg)  01/12/22 146 lb (66.2 kg)    Multiple skin bruises d/t coumadin    From previous notes: -Did not tolerate omeprazole or Protonix.  Previously, has been doing well on AcipHex 20 mg p.o. once a day. -Had right upper quadrant abdominal pain attributed to congestive hepatomegaly. LFTs showed some improvement as below. [place]6/11/2019-showed prominent hepatic veins and IVC likely due to elevated right heart pressures.  No ascites or any other acute abnormalities.  Doppler showed normal direction of blood flow towards the liver. His diuretics were increased.  This is resulted in improvement of the right sided abdominal pain.  He has lost approximately 6 pounds over last 1 week. -[place]  He had to be on Lovenox bridging prior to colonoscopy which he did not like.     Past GI procedures :  CT AP without contrast 01/2022 -Negative for any acute abnormalities -Nonobstructing renal stones -Colonic diverticulosis -Right middle  lobe lung nodule -Liver looked normal.  No portal hypertension.  CT AP 01/2021 without contrast 1. No acute abnormality in the abdomen/pelvis. 2. Colonic diverticulosis without diverticulitis. 3. Ground-glass opacities in the lingula and to a lesser extent lower lobes consistent with COVID pneumonia. 4. Right middle lobe pulmonary nodule measuring 9 mm demonstrates 21 month stability, near certainly  benign.  EGD 06/2011 as above.    Colonoscopy 2015: neg. Had small polyp in 2012. [place] H/O acute gallstone pancreatitis 07/2017.  Underwent lap chole on 07/29/2017.  Postoperatively had intra-abdominal bleed, taken back for open laparotomy.  Had bleeding from unclear omental source.  Had 8U PRBC.  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 In light of concurrent left nephrolithiasis, will check CT renal colic next year instead of renal US.    Anticoagulated on Coumadin 01/04/2018   Anxiety 05/10/2016   Last Assessment & Plan:  Doing well off of zoloft.   Asthma    Asymptomatic microscopic hematuria 06/20/2017   Last Assessment & Plan:  Had hematuria workup in [facility] in 2016 which negative CT and cystoscopy. UA with 2+ blood last visit - we discussed recommendation for repeat workup at 5 years or if degree of hematuria progresses.    Atrial fibrillation ([place]) 03/29/2017   Atrial flutter ([place]) 03/29/2017   Backache 12/14/2015   Last Assessment & Plan:  Pain management referral for further evaluation.   Bilateral pleural effusion 08/09/2017   CHF (congestive heart failure) (HCC)    Chronic allergic rhinitis 04/28/2016   Last Assessment & Plan:  Continue astelin   Chronic anticoagulation 03/29/2017   Chronic atrial fibrillation ([place]) 07/23/2015   Last Assessment & Plan:  Coumadin and metoprolol, cardiology referral to establish care.   Chronic midline back pain 12/14/2015   Last Assessment & Plan:  Pain management referral for further evaluation.   Chronic obstructive lung disease ([place]) 06/12/2016   With hypoxia   Chronic prostatitis 07/23/2015   Last Assessment & Plan:  Has largely resolved since stopping bike riding. Recommend annual DRE AND PSA - will see back 12/2015 for annual screening, given 1st degree fhx. To call  office for recurrent prostatitis symptoms.    Chronic respiratory failure with hypoxia ([place]) 07/29/2019   Cirrhosis of liver not due to alcohol ([place]) 07/01/2019   COPD (chronic obstructive pulmonary disease) ([place]) 06/12/2016   Coronary arteriosclerosis in native artery 03/29/2017   Coronary artery disease involving native coronary artery of native heart with angina pectoris ([place]) 03/29/2017   Cough 10/17/2016   Last Assessment & Plan:  Discussed typical course for acute viral illness. If symptoms worsen or fail to improve by 7-10d, delayed ATBs, fluids, rest, NSAIDs/APAP prn. Seek care if not improving. Needs earlier INR check due to ATBs.   Dyspnea 02/01/2016   Last Assessment & Plan:  Overall improving, eval by pulm, plan for CT, neg stress test with cardiology. Recent switch to carvedilol due to side effects.   Encounter for therapeutic drug monitoring 01/06/2019   Enterococcal bacteremia    Epidermoid cyst of skin 08/24/2017   Essential hypertension 12/14/2015   Last Assessment & Plan:  Hypertension control: controlled  Medications: compliant Medication Management: as noted in orders Home blood pressure monitoring recommended additionally as needed for symptoms  The patient's care plan  was reviewed and updated. Instructions and counseling were provided regarding patient goals and barriers. He was counseled to adopt a healthy lifestyle. Educational resources and self-management tools have been provided as charted in [facility] list.    H/O maze procedure 03/29/2017   [name] 03/29/2017   Overview:  2011   History of coronary artery bypass graft 03/29/2017   Hx of CABG 03/29/2017   Hyperlipidemia 03/29/2017   Hypersensitivity angiitis ([place]) 10/01/2017   Hypertensive heart disease 03/29/2017   Hypertensive heart disease with heart failure ([place]) 03/29/2017   Hypertensive heart failure ([place]) 03/29/2017   Hypokalemia due to excessive renal loss of potassium 02/18/2018   Hypotension, chronic  06/06/2018   International normalized ratio (INR) raised 07/26/2017   Kidney stone 07/23/2015   Kidney stones 07/23/2015   Overview:  x 3  Last Assessment & Plan:  By [place] has left nephrolithiasis, but not visible by KUB. Will check CT renal colic next year to assess both stone burden as well as to surveil AML.    Left ureteral stone 01/23/2018   Leukocytoclastic vasculitis ([place]) 10/01/2017   Localized edema 01/04/2018   Long term (current) use of anticoagulants 03/29/2017   Lumbar radicular pain 01/19/2016   Lumbar radiculopathy 01/19/2016   Maculopapular rash 09/03/2017   Microscopic hematuria 06/20/2017   Last Assessment & Plan:  Had hematuria workup in [facility] [place] in 2016 which negative CT and cystoscopy. UA with 2+ blood last visit - we discussed recommendation for repeat workup at 5 years or if degree of hematuria progresses.    Multiple nodules of lung 06/12/2016   Nasal discharge 02/25/2016   Last Assessment & Plan:  Trial zyrtec and flonase   Nephrolithiasis 07/23/2015   Overview:  x 3  Last Assessment & Plan:  By [place] has left nephrolithiasis, but not visible by KUB. Will check CT renal colic next year to assess both stone burden as well as to surveil AML.   Overview:  x 3  Last Assessment & Plan:  Has 79m nonobstructing LUP stone - not visible by KUB.  Will check renal [place]8/2019 - he will contact office sooner if symptomatic.    Non-sustained ventricular tachycardia 03/29/2017   Nonsustained ventricular tachycardia 03/29/2017   Other hyperlipidemia 03/29/2017   Palpitations 10/01/2017   Pleural effusion, bilateral 08/09/2017   Pneumonia due to COVID-19 virus 02/07/2021   Post-nasal drainage 02/25/2016   Last Assessment & Plan:  Trial zyrtec and flonase   Prostate cancer screening 06/20/2017   Last Assessment & Plan:  Recommend continued annual CaP screening until within 10 years of life expectancy. Given good health and fhx of longevity, would anticipate CaP screening to continue until age 76  PSA today and again in  one year on day of visit.   Pulmonary arterial hypertension ([place] 02/20/2018   Pulmonary edema    Pulmonary hypertension ([place] 08/09/2017   Pulmonary nodules 06/12/2016   S/P AVR 03/20/2016   S/P AVR (aortic valve replacement) 03/20/2016   Strain of deltoid muscle, initial encounter    Supratherapeutic INR 07/26/2017   Syncope and collapse 02/01/2016   Typical atrial flutter ([place] 02/01/2016    Past Surgical History:  Procedure Laterality Date   CHOLECYSTECTOMY     CORONARY ARTERY BYPASS GRAFT     EXPLORATORY LAPAROTOMY  07/30/2017   FOOT SURGERY     FRACTURE SURGERY Right    wrist and forearm   HERNIA REPAIR     MECHANICAL AORTIC VALVE REPLACEMENT  NASAL SINUS SURGERY     RIGHT HEART CATH N/A 01/18/2018   Procedure: RIGHT HEART CATH;  Surgeon: Jolaine Artist, MD;  Location: [place] [facility];  Service: Cardiovascular;  Laterality: N/A;   RIGHT HEART CATH N/A 05/14/2018   Procedure: RIGHT HEART CATH;  Surgeon: Jolaine Artist, MD;  Location: [place] [facility];  Service: Cardiovascular;  Laterality: N/A;   RIGHT HEART CATH N/A 09/29/2021   Procedure: RIGHT HEART CATH;  Surgeon: Jolaine Artist, MD;  Location: [place] [facility];  Service: Cardiovascular;  Laterality: N/A;   TEE WITHOUT CARDIOVERSION N/A 05/21/2018   Procedure: TRANSESOPHAGEAL ECHOCARDIOGRAM (TEE);  Surgeon: Jolaine Artist, MD;  Location: [facility];  Service: Cardiovascular;  Laterality: N/A;   UPPER GASTROINTESTINAL ENDOSCOPY  07/12/2017   Patchy areas of mucosal inflammation noted in the antrum with edema,erthema and ulcerations. Bx. Chronicfocally active gastritis.   VASECTOMY      Family History  Problem Relation Age of Onset   Asthma Mother    Arthritis Mother    Heart attack Father    Hypertension Father    Stroke Paternal Grandmother    Colon cancer Neg Hx     Social History   Tobacco Use   Smoking status: Former    Packs/day: 2.00    Years: 34.00    Pack years: 68.00    Types:  Cigarettes    Quit date: 07/16/2000    Years since quitting: 21.6   Smokeless tobacco: Never  Vaping Use   Vaping Use: Never used  Substance Use Topics   Alcohol use: Not Currently   Drug use: No    Current Outpatient Medications  Medication Sig Dispense Refill   acetaminophen (TYLENOL) 500 MG tablet Take 500-1,000 mg by mouth every 6 (six) hours as needed (pain.).     albuterol (VENTOLIN HFA) 108 (90 Base) MCG/ACT inhaler Inhale 2 puffs into the lungs every 6 (six) hours as needed for wheezing or shortness of breath. 8 g 6   ammonium lactate (LAC-HYDRIN) 12 % lotion Apply 1 application topically as needed for dry skin.     Ascorbic Acid (VITAMIN C GUMMIE PO) Take 2 tablets by mouth daily at 12 noon.     aspirin 81 MG EC tablet Take 1 tablet (81 mg total) by mouth in the morning. 30 tablet 2   busPIRone (BUSPAR) 7.5 MG tablet Take 1 tablet (7.5 mg total) by mouth 2 (two) times daily. 14 tablet 0   Cholecalciferol (VITAMIN D3) 50 MCG (2000 UT) TABS Take 2,000 Units by mouth daily at 12 noon.     Coenzyme Q10-Vitamin E (QUNOL ULTRA COQ10 PO) Take 1 capsule by mouth in the morning.     dapagliflozin propanediol (FARXIGA) 10 MG TABS tablet Take 1 tablet (10 mg total) by mouth daily before breakfast. 30 tablet 6   ferrous sulfate 325 (65 FE) MG tablet Take 1 tablet (325 mg total) by mouth every other day. 15 tablet 2   KRILL OIL PO Take 800 mg by mouth every other day. In the morning     Magnesium 200 MG CHEW Chew 400 mg by mouth daily at 12 noon.     midodrine (PROAMATINE) 5 MG tablet Take 3 tablets (15 mg total) by mouth 3 (three) times daily with meals. 270 tablet 2   ondansetron (ZOFRAN) 8 MG tablet Take 1 tablet (8 mg total) by mouth every 8 (eight) hours as needed for nausea or vomiting. 20 tablet 0  oxyCODONE (OXY IR/ROXICODONE) 5 MG immediate release tablet Take 1 tablet (5 mg total) by mouth every 6 (six) hours as needed for breakthrough pain or moderate pain. 10 tablet 0    rosuvastatin (CRESTOR) 5 MG tablet Take 1 tablet (5 mg total) by mouth daily. 30 tablet 2   Selexipag 200 MCG TABS Take 1 tablet (200 mcg total) by mouth 2 (two) times daily. 60 tablet    spironolactone (ALDACTONE) 25 MG tablet Take 1 tablet (25 mg total) by mouth daily. 30 tablet 0   tadalafil, PAH, (ADCIRCA) 20 MG tablet Take 40 mg by mouth in the morning.     torsemide (DEMADEX) 20 MG tablet Take 1 tablet (20 mg total) by mouth daily. 30 tablet 0   Turmeric 500 MG TABS Take 1 capsule by mouth daily in the afternoon. With Ginger 50 mg     vitamin B-12 (CYANOCOBALAMIN) 1000 MCG tablet Take 1,000 mcg by mouth daily at 12 noon.     warfarin (COUMADIN) 5 MG tablet See admin instructions. Take a full tablet 2 days a week and 1/2 tablet 5 days a week. 20 tablet 2   Zinc 50 MG TABS Take 50 mg by mouth every other day. In the afternoon     famotidine (PEPCID) 20 MG tablet TAKE 1 TABLET(20 MG) BY MOUTH DAILY (Patient not taking: Reported on 02/21/2022) 90 tablet 0   No current facility-administered medications for this visit.    Allergies  Allergen Reactions   Augmentin [Amoxicillin-Pot Clavulanate] Anaphylaxis and Diarrhea    "Upset stomach"   Chicken Allergy Other (See Comments)   Pantoprazole Sodium Nausea Only    Gets gassy and starting itching like crazy Gets gassy and starting itching like crazy   Valium [Diazepam] Other (See Comments)    HA and Abd pain.   Tape Rash and Other (See Comments)    Surgical tape   Wound Dressing Adhesive Other (See Comments) and Rash    Surgical tape Surgical tape Surgical tape    Review of Systems:  Has easy bruisability Multiple skin bruises.    Physical Exam:    BP 122/72    Pulse 87    Ht 5' 9" (1.753 m)    Wt 115 lb (52.2 kg)    SpO2 97%    BMI 16.98 kg/m  Filed Weights   02/21/22 1055  Weight: 115 lb (52.2 kg)   Gen: awake, alert, NAD HEENT: anicteric, no pallor CV: RRR, aortic murmur Pulm: Bilateral decreased breath sounds Abd: soft,  NT/ND, +BS throughout Ext: no c/c/e Neuro: nonfocal. Mutiple bruises Skin-multiple bruises.   Data Reviewed: I have personally reviewed following labs and imaging studies  CBC: CBC Latest Ref Rng & Units 02/02/2022 02/01/2022 01/31/2022  WBC 4.0 - 10.5 K/uL 6.6 4.4 5.2  Hemoglobin 13.0 - 17.0 g/dL 12.9(L) 12.5(L) 11.8(L)  Hematocrit 39.0 - 52.0 % 40.7 38.6(L) 35.7(L)  Platelets 150 - 400 K/uL 220 227 206    CMP: CMP Latest Ref Rng & Units 02/05/2022 02/04/2022 02/03/2022  Glucose 70 - 99 mg/dL 95 105(H) 105(H)  BUN 8 - 23 mg/dL 33(H) 34(H) 31(H)  Creatinine 0.61 - 1.24 mg/dL 1.52(H) 1.57(H) 1.47(H)  Sodium 135 - 145 mmol/L 126(L) 127(L) 128(L)  Potassium 3.5 - 5.1 mmol/L 4.5 4.8 3.6  Chloride 98 - 111 mmol/L 86(L) 89(L) 87(L)  CO2 22 - 32 mmol/L _0 Calcium 8.9 - 10.3 mg/dL 9.9 9.8 9.8  Total Protein 6.5 - 8.1 g/dL - - -  Total Bilirubin 0.3 - 1.2 mg/dL - - -  Alkaline Phos 38 - 126 U/L - - -  AST 15 - 41 U/L - - -  ALT 0 - 44 U/L - - -   Hepatic Function Latest Ref Rng & Units 01/27/2022 01/24/2022 11/06/2021  Total Protein 6.5 - 8.1 g/dL 7.1 6.1(L) 7.7  Albumin 3.5 - 5.0 g/dL 3.1(L) 2.7(L) 3.5  AST 15 - 41 U/L 42(H) 27 32  ALT 0 - 44 U/L _0 Alk Phosphatase 38 - 126 U/L 76 54 52  Total Bilirubin 0.3 - 1.2 mg/dL 2.1(H) 2.0(H) 2.1(H)  Bilirubin, Direct 0.0 - 0.2 mg/dL 0.8(H) 0.8(H) 0.7(H)       Radiology Studies:    US Liver Doppler:  Result Date: 05/16/2018 CLINICAL DATA:  Hepatic cirrhosis. EXAM: DUPLEX ULTRASOUND OF LIVER TECHNIQUE: Color and duplex Doppler ultrasound was performed to evaluate the hepatic in-flow and out-flow vessels. COMPARISON:  None. FINDINGS: Portal Vein Velocities Main:  26 cm/sec Right:  14 cm/sec Left:  18 cm/sec Normal hepatopetal flow is noted in the portal veins. Hepatic Vein Velocities Right:  40 cm/sec Middle:  30 cm/sec Left:  32 cm/sec Normal hepatofugal flow is noted in the hepatic veins. Hepatic Artery Velocity:  65 cm/sec Splenic  Vein Velocity:  33.4 cm/sec Varices: Absent. Ascites: Present. Superior mesenteric vein velocity: 30 centimeters/second IMPRESSION: No Doppler evidence of hepatic, splenic or portal venous thrombosis or occlusion. Mild ascites is noted. Electronically Signed   By: Marijo Conception, M.D.   On: 05/16/2018 13:47   US Abdomen Limited Ruq:  Result Date: 05/16/2018 CLINICAL DATA:  Hepatic cirrhosis. EXAM: ULTRASOUND ABDOMEN LIMITED RIGHT UPPER QUADRANT COMPARISON:  None. FINDINGS: Gallbladder: Status post cholecystectomy. Common bile duct: Diameter: 3.5 mm which is within normal limits. Liver: No focal lesion identified. Mildly heterogeneous echotexture of hepatic parenchyma is noted with nodular hepatic contours suggesting cirrhosis. Mild ascites is noted. Portal vein is patent on color Doppler imaging with normal direction of blood flow towards the liver. IMPRESSION: Status post cholecystectomy. Findings consistent with hepatic cirrhosis. Mild ascites is noted around the liver. No focal abnormality is noted. Electronically Signed   By: Marijo Conception, M.D.   On: 05/16/2018 13:32        Carmell Austria, MD 02/21/2022, 11:15 AM  Cc: Lance Pai, PA-C

## 2022-02-21 NOTE — Telephone Encounter (Signed)
Lance Hicks from [facility] called to make Lance Hicks aware that the patient has a lot of dogs that are not secured in the household and due to them they will not be able to give home health care. He stated that it was a safety issue. For questions he can be reached at [phone].

## 2022-02-21 NOTE — Patient Instructions (Signed)
Description   Take 1 tablet today and then continue taking 1/2 tablet daily. Please call our office with any medication changes or concerns [phone]  Return in 1 week for INR check.

## 2022-02-21 NOTE — Patient Instructions (Addendum)
If you are age 75 or older, your body mass index should be between 23-30. Your Body mass index is 16.98 kg/m. If this is out of the aforementioned range listed, please consider follow up with your Primary Care Provider.  If you are age 57 or younger, your body mass index should be between 19-25. Your Body mass index is 16.98 kg/m. If this is out of the aformentioned range listed, please consider follow up with your Primary Care Provider.   ________________________________________________________  The [facility] providers would like to encourage you to use [facility] to communicate with providers for non-urgent requests or questions.  Due to long hold times on the telephone, sending your provider a message by  [facility] may be a faster and more efficient way to get a response.  Please allow 48 business hours for a response.  Please remember that this is for non-urgent requests.  _______________________________________________________  Continue Miralax 17g daily and colace 2 tablets daily  Resume Aciphex. A script has been sent.  Please follow up with pulmonary and cardiology as requested.  Continue fluid and salt restrictions.  Please call with any questions or concerns.  Thank you,  Dr. Jackquline Denmark

## 2022-02-22 NOTE — Telephone Encounter (Signed)
LVM ? ?It was sent electronically on 2-28  ?

## 2022-02-22 NOTE — Telephone Encounter (Signed)
Spoke to pharmacy ([facility]) and they have the script. She said they were running behind yesterday and it isn't ready yet but they received the script on 2-28 ? ?LVM got forward 2 times  ?

## 2022-02-23 ENCOUNTER — Other Ambulatory Visit: Payer: Self-pay

## 2022-02-23 ENCOUNTER — Ambulatory Visit (HOSPITAL_BASED_OUTPATIENT_CLINIC_OR_DEPARTMENT_OTHER)
Admission: RE | Admit: 2022-02-23 | Discharge: 2022-02-23 | Disposition: A | Discharge status: Home / Self Care | Payer: Medicare Other | Source: Ambulatory Visit | Attending: Medical | Admitting: Medical

## 2022-02-23 ENCOUNTER — Ambulatory Visit (INDEPENDENT_AMBULATORY_CARE_PROVIDER_SITE_OTHER): Payer: Medicare Other | Admitting: Medical

## 2022-02-23 VITALS — BP 107/70 | HR 76 | Resp 18 | Ht 69.0 in | Wt 114.8 lb

## 2022-02-23 DIAGNOSIS — E538 Deficiency of other specified B group vitamins: Secondary | ICD-10-CM | POA: Diagnosis not present

## 2022-02-23 DIAGNOSIS — G2581 Restless legs syndrome: Secondary | ICD-10-CM | POA: Diagnosis not present

## 2022-02-23 DIAGNOSIS — D649 Anemia, unspecified: Secondary | ICD-10-CM

## 2022-02-23 DIAGNOSIS — I517 Cardiomegaly: Secondary | ICD-10-CM | POA: Diagnosis not present

## 2022-02-23 DIAGNOSIS — J8489 Other specified interstitial pulmonary diseases: Secondary | ICD-10-CM | POA: Diagnosis not present

## 2022-02-23 DIAGNOSIS — E43 Unspecified severe protein-calorie malnutrition: Secondary | ICD-10-CM

## 2022-02-23 DIAGNOSIS — I5021 Acute systolic (congestive) heart failure: Secondary | ICD-10-CM | POA: Diagnosis not present

## 2022-02-23 DIAGNOSIS — R0781 Pleurodynia: Secondary | ICD-10-CM | POA: Diagnosis not present

## 2022-02-23 DIAGNOSIS — N189 Chronic kidney disease, unspecified: Secondary | ICD-10-CM | POA: Diagnosis not present

## 2022-02-23 DIAGNOSIS — G629 Polyneuropathy, unspecified: Secondary | ICD-10-CM | POA: Diagnosis not present

## 2022-02-23 DIAGNOSIS — N179 Acute kidney failure, unspecified: Secondary | ICD-10-CM | POA: Diagnosis not present

## 2022-02-23 DIAGNOSIS — J90 Pleural effusion, not elsewhere classified: Secondary | ICD-10-CM | POA: Diagnosis not present

## 2022-02-23 DIAGNOSIS — I509 Heart failure, unspecified: Secondary | ICD-10-CM | POA: Diagnosis not present

## 2022-02-23 LAB — CBC WITH DIFFERENTIAL/PLATELET
Basophils Absolute: 0 10*3/uL (ref 0.0–0.1)
Basophils Relative: 0.8 % (ref 0.0–3.0)
Eosinophils Absolute: 0.1 10*3/uL (ref 0.0–0.7)
Eosinophils Relative: 2 % (ref 0.0–5.0)
HCT: 34.6 % — ABNORMAL LOW (ref 39.0–52.0)
Hemoglobin: 11.4 g/dL — ABNORMAL LOW (ref 13.0–17.0)
Lymphocytes Relative: 11.3 % — ABNORMAL LOW (ref 12.0–46.0)
Lymphs Abs: 0.5 10*3/uL — ABNORMAL LOW (ref 0.7–4.0)
MCHC: 33 g/dL (ref 30.0–36.0)
MCV: 83 fl (ref 78.0–100.0)
Monocytes Absolute: 0.7 10*3/uL (ref 0.1–1.0)
Monocytes Relative: 14.4 % — ABNORMAL HIGH (ref 3.0–12.0)
Neutro Abs: 3.3 10*3/uL (ref 1.4–7.7)
Neutrophils Relative %: 71.5 % (ref 43.0–77.0)
Platelets: 227 10*3/uL (ref 150.0–400.0)
RBC: 4.17 Mil/uL — ABNORMAL LOW (ref 4.22–5.81)
RDW: 25.8 % — ABNORMAL HIGH (ref 11.5–15.5)
WBC: 4.5 10*3/uL (ref 4.0–10.5)

## 2022-02-23 LAB — COMPREHENSIVE METABOLIC PANEL
ALT: 13 U/L (ref 0–53)
AST: 27 U/L (ref 0–37)
Albumin: 4 g/dL (ref 3.5–5.2)
Alkaline Phosphatase: 79 U/L (ref 39–117)
BUN: 17 mg/dL (ref 6–23)
CO2: 27 mEq/L (ref 19–32)
Calcium: 10.2 mg/dL (ref 8.4–10.5)
Chloride: 94 mEq/L — ABNORMAL LOW (ref 96–112)
Creatinine, Ser: 1.62 mg/dL — ABNORMAL HIGH (ref 0.40–1.50)
GFR: 41.44 mL/min — ABNORMAL LOW (ref >60.00)
Glucose, Bld: 114 mg/dL — ABNORMAL HIGH (ref 70–99)
Potassium: 4.7 mEq/L (ref 3.5–5.1)
Sodium: 130 mEq/L — ABNORMAL LOW (ref 135–145)
Total Bilirubin: 2.4 mg/dL — ABNORMAL HIGH (ref 0.2–1.2)
Total Protein: 8.1 g/dL (ref 6.0–8.3)

## 2022-02-23 LAB — IRON: Iron: 50 ug/dL (ref 42–165)

## 2022-02-23 LAB — MAGNESIUM: Magnesium: 2.1 mg/dL (ref 1.5–2.5)

## 2022-02-23 LAB — BRAIN NATRIURETIC PEPTIDE: Pro B Natriuretic peptide (BNP): 891 pg/mL — ABNORMAL HIGH (ref 0.0–100.0)

## 2022-02-23 LAB — VITAMIN B12: Vitamin B-12: >1504 pg/mL — ABNORMAL HIGH (ref 211–911)

## 2022-02-23 MED ORDER — GABAPENTIN 100 MG PO CAPS: 100.0000 mg | ORAL_CAPSULE | Freq: Every day | ORAL | 0 refills | Status: DC

## 2022-02-23 NOTE — Patient Instructions (Signed)
Follow-up from recent hospitalization.  Patient has anemia, protein malnutrition, chronic CKD with recent acute injury, left rib pain x1 week post fall, CHF, hypomagnesia, neuropathy, restless leg, low iron, B12 deficiency and anemia. ? ?We will get CBC, iron level, B12 level, CMP, BNP, chest x-ray and left rib series. ? ?Continue current medications as advised on hospital discharge. ? ?Adding gabapentin 100 mg tablet to use 1 tablet at night.  Will see if this helps neuropathy and restless leg symptoms.  Explained sedation is common side effect so be careful if he has to get up at night to use restroom.  Get balance and would recommend keeping walker by your bedside. ? ?If x-rays show fracture consider use of narcotic. ? ?Keep follow-up appointment with cardiologist next week.  If significant abnormality on labs we will update a cardiologist before appointment. ? ?We did also talk with home health and notified them that  pt states that he can put his dog in room and closed the door prior to home health coming out.  I recommended to give patient heads up that they are coming out so he can have the dogs in his room before they arrive. ? ?Follow-up date with me to be determined after lab and imaging review. ?

## 2022-02-23 NOTE — Telephone Encounter (Signed)
Patient came into office today stated he is able to put dogs up in a room , called steve to let him know patient is willing to put dogs away for the visits , steve stated he would have to reach back out to his office since its a safety issue and call us back ?

## 2022-02-23 NOTE — Progress Notes (Signed)
Subjective:    Patient ID: Lance Hicks, male    DOB: October 09, 1946, 76 y.o.   MRN: [id number]  HPI Pt in for follow up from hospitalization.  Admit date:     01/23/2022  Discharge date: 02/05/22  Discharge Physician: Jimmy Picket Arrien    PCP: [name], PA-C    Recommendations at discharge:     Patient will continue diuresis with torsemide 20 mg daily, increase to twice daily in case of edema, dyspnea, or weight gain 3 lbs in 48 hrs or 5 lbs in 7 days.  Follow up renal function and electrolytes in 7 days, follow up on serum NA.   Discharge Diagnoses: Active Problems:   Acute on chronic combined systolic and diastolic CHF (congestive heart failure) (HCC)   Pulmonary arterial hypertension (HCC)   [name]   Hypotension, chronic   Chronic atrial fibrillation (HCC)   Hyperlipidemia   Acute kidney injury superimposed on chronic kidney disease (HCC)   Protein-calorie malnutrition, severe (HCC)   Physical deconditioning   GERD (gastroesophageal reflux disease)   Iron deficiency anemia   Alcohol abuse   Abdominal pain   Resolved Problems:   Hypokalemia, Hypomagnesmia   Hospital Course: Lance Hicks is a 76 year old male with past medical history significant for chronic systolic congestive heart failure NYHA class III, essential pretension, CAD s/p CABG, aortic stenosis s/p aortic valve replacement on Coumadin, orthostatic hypotension, COPD, pulmonary hypertension, type 2 diabetes mellitus, paroxysmal atrial fibrillation, cirrhosis secondary to EtOH abuse who presented to [facility] ED on 1/30 with progressive lower extremity edema.   Recently hospitalized at [facility] for pneumonia; treated with antibiotics in which his Lasix was held for 3 days for "low blood pressure".  Discharged home and patient started noticed increased swelling of his lower legs and he resumed his Lasix for the past 3 days however with no improvement.  Patient  also reporting exertional dyspnea and was noted by his friend to be "very winded" and sent him to the hospital for further evaluation.  Denies cough, no fever/chills.   In the ED, BP was slightly hypotensive, found to be hypoxic and placed on 3 L nasal cannula.  BP 77/60, HR 38 to 53, RR 15 and oxygen saturation 100% on supplemental 02 per [place]. His lungs had rales on auscultation bilaterally, heart with S1 and S2 present and rhythmic, abdomen soft and positive lower extremity edema.    Na 130, K 3,5, Cl 97, bicarbonate 25, bun 11 and cr 1,15 BNP 1,831 Wbc 4,5, hgb 11,0 hct 35,0 plt 132  Sars covid 19 negative   Chest radiograph with bilateral interstitial infiltrates at the lower lobes with fluid in the right fissure, bilateral pleural effusions.    Cardiology consulted and patient was given 80 mg IV Lasix.  Hospital service consulted for further evaluation management.   EKG 83 bpm, left axis deviation, with right bundle branch block, atrial fibrillation, with no significant ST segment changes, and negative T wave lead I and AVL.    Patient was diuresed with furosemide with good response. He did required inotropic support with milrinone.  Continue to be very weak and deconditioned, positive back and abdominal pain.    Patient very weak and deconditioned, pulmonary hypertension with acute on chronic core pulmonale.    Patient is being transferred to SNF for further physical therapy and occupational therapy.  Follow up with cardiology as outpatient.    Assessment and Plan: Acute on chronic combined systolic  and diastolic CHF (congestive heart failure) ([place])- (present on admission) Patient had a prolonged hospitalization, he received aggressive diuresis with furosemide and inotropic support with milrinone. Negative fluid balance was achieved with significant improvement of his symptoms.  At the time of his discharge he is negative 15,975 ml.    Further work up with echocardiogram showed EF  40 to 50% on LV with global hypokinesis. Decreased RV systolic function with mild enlarged cavity. Moderate to severe Tricuspid regurgitation.   Patient will continue diuresis with torsemide 20 mg daily and instructions to increase in case recurrent edema to bid dosing.    Patient not on B blockade or RASS inhibition due to risk of worsening hypotension.    Assessment and Plan: Acute on chronic combined systolic and diastolic CHF (congestive heart failure) ([place])- (present on admission) Patient had a prolonged hospitalization, he received aggressive diuresis with furosemide and inotropic support with milrinone. Negative fluid balance was achieved with significant improvement of his symptoms.  At the time of his discharge he is negative 15,975 ml.    Further work up with echocardiogram showed EF 40 to 50% on LV with global hypokinesis. Decreased RV systolic function with mild enlarged cavity. Moderate to severe Tricuspid regurgitation.   Patient will continue diuresis with torsemide 20 mg daily and instructions to increase in case recurrent edema to bid dosing.    Patient not on B blockade or RASS inhibition due to risk of worsening hypotension.         Pulmonary arterial hypertension ([place])- (present on admission) Patient very weak and deconditioned with poor respiratory reserve.  His oxygenation is 91% on room air at rest. He has poor prognosis, palliative care was consulted, his code status was changed to DNR and he was offered hospice services. Patient at this point in time would like to continue with medical care for his cardiopulmonary disease.    Continue with Selexipag and as needed supplemental 02 to keep oxygen saturation 88% or greater.    [name] No signs of valve dysfunction, per echocardiogram.    Patient was continued on warfarin for anticoagulation, his discharge INR is 1,9 Keep target INR 2 to 3.     Hypotension, chronic- (present on  admission) Patient will continue with midodrine tid for hypotension He has poor prognosis, limited pharmacotherapy for cardiovascular disease due to hypotension.    Chronic atrial fibrillation ([place])- (present on admission) Continue anticoagulation with warfarin, currently is rate controlled.  Patient not on AV blockade at this point in time.    Acute kidney injury superimposed on chronic kidney disease ([place])- (present on admission) CKD stage 3a/ hyponatremia. Hypomagnesemia  Patient with improved volume status, his renal function at the time of discharge is 1,52, from a peak of 1,80, his Na is 126 and K is 4,5 with a serum bicarbonate at 28.   Plan to continue diuresis with torsemide and follow up renal function in 7 days. Continue with unrestricted diet and protein supplementation.    Dose of torsemide has been decreased, no diuretic therapy today and start on 02/06/22.    Hyperlipidemia- (present on admission) Continue with statin therapy    Protein-calorie malnutrition, severe ([place])- (present on admission) Body mass index is 18.26 kg/m.  Nutrition Status: Nutrition Problem: Increased nutrient needs Etiology: chronic illness (CHF) Signs/Symptoms: estimated needs Interventions: MVI, Prostat, Liberalize Diet, Refer to RD note for recommendations   Continue with  Nutritional supplements, patient is very weak and deconditioned.    Abdominal  pain- (present on admission) CT renal with renal stones but not obstructive uropathy   Pain better controlled with oral oxycodone, he did received IV morphine during his hospitalization.      Alcohol abuse- (present on admission) No signs of acute withdrawal.  Continue neuro checks per unit protocol.      Iron deficiency anemia- (present on admission) Anemia panel with iron 164, TIBC 360, ferritin 38, folate 29.1, B12 2080.     Continue iron supplementation    GERD (gastroesophageal reflux disease)- (present on admission) Continue  antiacid therapy., patient is tolerating po well.    Physical deconditioning- (present on admission) Patient with significant weakness, debility, gait disturbance and deconditioning from recurrent hospitalization.  PT/OT currently recommends SNF.   Patient has agreed to be transferred to SNF where he can continue getting physical therapy and medical therapy for his heart failure.      Anemia Anemia of chronic disease,    Hypokalemia, Hypomagnesmia-resolved as of 02/01/2022 Repleted during hospitalization.  Continue potassium supplementation 40 mg p.o. daily on discharge.   Left rib pain and bilateral lower extremity pain. Legs are keeping him from sleeping.   Pt has left rib pain after fall last week. He fell last week tripped over wire and hit corner of a wall.  Has bandaid over area and some scab. He did not get xray.  He lower legs and ankle have intermittent cold and warm sensation with restless sensation. Low level discomfort.   Since dc from hospital his 02 sat is 97%.  No leg swelling.   He has 3 dogs. Home health won't go out since he has 3 dogs. He was told by [facility] even if dogs were locked up. Pt would put dogs in Bedroom.   Currently pt having church members are coming out to help.  Pt discharged to rehab. He states they did not give him his heart medication.  Seeing cardiologist next week.   Review of Systems  Constitutional:  Negative for chills, fatigue and fever.  HENT:  Negative for congestion and dental problem.   Respiratory:  Negative for cough, chest tightness, shortness of breath and wheezing.   Cardiovascular:  Negative for chest pain and palpitations.  Gastrointestinal:  Negative for abdominal pain, blood in stool, nausea and vomiting.  Genitourinary:  Negative for dysuria, flank pain, penile discharge and urgency.  Musculoskeletal:  Negative for back pain and neck pain.       Rib pain  Skin:  Negative for rash.    Past Medical History:  Diagnosis Date    Allergic rhinitis 04/28/2016   Last Assessment & Plan:  Continue astelin   Anemia 07/01/2019   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   Last Assessment & Plan:  Stable in size on annual imaging. In light of concurrent left nephrolithiasis, will check CT renal colic next year instead of renal US.    Anticoagulated on Coumadin 01/04/2018   Anxiety 05/10/2016   Last Assessment & Plan:  Doing well off of zoloft.   Asthma    Asymptomatic microscopic hematuria 06/20/2017   Last Assessment & Plan:  Had hematuria workup in [facility] in 2016 which negative CT and cystoscopy. UA with 2+ blood last visit - we discussed recommendation for repeat workup at 5 years or if degree of hematuria progresses.    Atrial fibrillation ([place]) 03/29/2017   Atrial flutter ([place]) 03/29/2017   Backache 12/14/2015   Last Assessment & Plan:  Pain management referral for further evaluation.   Bilateral  pleural effusion 08/09/2017   CHF (congestive heart failure) (HCC)    Chronic allergic rhinitis 04/28/2016   Last Assessment & Plan:  Continue astelin   Chronic anticoagulation 03/29/2017   Chronic atrial fibrillation ([place]) 07/23/2015   Last Assessment & Plan:  Coumadin and metoprolol, cardiology referral to establish care.   Chronic midline back pain 12/14/2015   Last Assessment & Plan:  Pain management referral for further evaluation.   Chronic obstructive lung disease ([place]) 06/12/2016   With hypoxia   Chronic prostatitis 07/23/2015   Last Assessment & Plan:  Has largely resolved since stopping bike riding. Recommend annual DRE AND PSA - will see back 12/2015 for annual screening, given 1st degree fhx. To call office for recurrent prostatitis symptoms.    Chronic respiratory failure with hypoxia ([place]) 07/29/2019   Cirrhosis of liver not due to alcohol ([place]) 07/01/2019   COPD (chronic obstructive pulmonary disease) ([place]) 06/12/2016   Coronary arteriosclerosis in native artery 03/29/2017   Coronary artery disease involving native coronary artery of  native heart with angina pectoris ([place]) 03/29/2017   Cough 10/17/2016   Last Assessment & Plan:  Discussed typical course for acute viral illness. If symptoms worsen or fail to improve by 7-10d, delayed ATBs, fluids, rest, NSAIDs/APAP prn. Seek care if not improving. Needs earlier INR check due to ATBs.   Dyspnea 02/01/2016   Last Assessment & Plan:  Overall improving, eval by pulm, plan for CT, neg stress test with cardiology. Recent switch to carvedilol due to side effects.   Encounter for therapeutic drug monitoring 01/06/2019   Enterococcal bacteremia    Epidermoid cyst of skin 08/24/2017   Essential hypertension 12/14/2015   Last Assessment & Plan:  Hypertension control: controlled  Medications: compliant Medication Management: as noted in orders Home blood pressure monitoring recommended additionally as needed for symptoms  The patient's care plan was reviewed and updated. Instructions and counseling were provided regarding patient goals and barriers. He was counseled to adopt a healthy lifestyle. Educational resources and self-management tools have been provided as charted in [facility] list.    H/O maze procedure 03/29/2017   [name] 03/29/2017   Overview:  2011   History of coronary artery bypass graft 03/29/2017   Hx of CABG 03/29/2017   Hyperlipidemia 03/29/2017   Hypersensitivity angiitis ([place]) 10/01/2017   Hypertensive heart disease 03/29/2017   Hypertensive heart disease with heart failure ([place]) 03/29/2017   Hypertensive heart failure ([place]) 03/29/2017   Hypokalemia due to excessive renal loss of potassium 02/18/2018   Hypotension, chronic 06/06/2018   International normalized ratio (INR) raised 07/26/2017   Kidney stone 07/23/2015   Kidney stones 07/23/2015   Overview:  x 3  Last Assessment & Plan:  By [place] has left nephrolithiasis, but not visible by KUB. Will check CT renal colic next year to assess both stone burden as well as to surveil AML.    Left ureteral stone 01/23/2018    Leukocytoclastic vasculitis ([place]) 10/01/2017   Localized edema 01/04/2018   Long term (current) use of anticoagulants 03/29/2017   Lumbar radicular pain 01/19/2016   Lumbar radiculopathy 01/19/2016   Maculopapular rash 09/03/2017   Microscopic hematuria 06/20/2017   Last Assessment & Plan:  Had hematuria workup in [facility] in 2016 which negative CT and cystoscopy. UA with 2+ blood last visit - we discussed recommendation for repeat workup at 5 years or if degree of hematuria progresses.    Multiple nodules of lung 06/12/2016   Nasal discharge 02/25/2016  Last Assessment & Plan:  Trial zyrtec and flonase   Nephrolithiasis 07/23/2015   Overview:  x 3  Last Assessment & Plan:  By [place] has left nephrolithiasis, but not visible by KUB. Will check CT renal colic next year to assess both stone burden as well as to surveil AML.   Overview:  x 3  Last Assessment & Plan:  Has 60m nonobstructing LUP stone - not visible by KUB.  Will check renal [place]2019 - he will contact office sooner if symptomatic.    Non-sustained ventricular tachycardia 03/29/2017   Nonsustained ventricular tachycardia 03/29/2017   Other hyperlipidemia 03/29/2017   Palpitations 10/01/2017   Pleural effusion, bilateral 08/09/2017   Pneumonia due to COVID-19 virus 02/07/2021   Post-nasal drainage 02/25/2016   Last Assessment & Plan:  Trial zyrtec and flonase   Prostate cancer screening 06/20/2017   Last Assessment & Plan:  Recommend continued annual CaP screening until within 10 years of life expectancy. Given good health and fhx of longevity, would anticipate CaP screening to continue until age 76  PSA today and again in one year on day of visit.   Pulmonary arterial hypertension ([place] 02/20/2018   Pulmonary edema    Pulmonary hypertension ([place] 08/09/2017   Pulmonary nodules 06/12/2016   S/P AVR 03/20/2016   S/P AVR (aortic valve replacement) 03/20/2016   Strain of deltoid muscle, initial encounter    Supratherapeutic INR 07/26/2017   Syncope and collapse  02/01/2016   Typical atrial flutter ([place] 02/01/2016     Social History   Socioeconomic History   Marital status: Single    Spouse name: Not on file   Number of children: Not on file   Years of education: Not on file   Highest education level: Not on file  Occupational History   Not on file  Tobacco Use   Smoking status: Former    Packs/day: 2.00    Years: 34.00    Pack years: 68.00    Types: Cigarettes    Quit date: 07/16/2000    Years since quitting: 21.6   Smokeless tobacco: Never  Vaping Use   Vaping Use: Never used  Substance and Sexual Activity   Alcohol use: Not Currently   Drug use: No   Sexual activity: Not on file  Other Topics Concern   Not on file  Social History Narrative   Not on file   Social Determinants of Health   Financial Resource Strain: Low Risk    Difficulty of Paying Living Expenses: Not very hard  Food Insecurity: No Food Insecurity   Worried About RCharity fundraiserin the Last Year: Never true   Ran Out of Food in the Last Year: Never true  Transportation Needs: No Transportation Needs   Lack of Transportation (Medical): No   Lack of Transportation (Non-Medical): No  Physical Activity: Not on file  Stress: Not on file  Social Connections: Not on file  Intimate Partner Violence: Not on file    Past Surgical History:  Procedure Laterality Date   CHOLECYSTECTOMY     CORONARY ARTERY BYPASS GRAFT     EXPLORATORY LAPAROTOMY  07/30/2017   FOOT SURGERY     FRACTURE SURGERY Right    wrist and forearm   HERNIA REPAIR     MECHANICAL AORTIC VALVE REPLACEMENT     NASAL SINUS SURGERY     RIGHT HEART CATH N/A 01/18/2018   Procedure: RIGHT HEART CATH;  Surgeon: BJolaine Artist MD;  Location: [facility];  Service: Cardiovascular;  Laterality: N/A;   RIGHT HEART CATH N/A 05/14/2018   Procedure: RIGHT HEART CATH;  Surgeon: Jolaine Artist, MD;  Location: [facility];  Service: Cardiovascular;  Laterality: N/A;   RIGHT HEART  CATH N/A 09/29/2021   Procedure: RIGHT HEART CATH;  Surgeon: Jolaine Artist, MD;  Location: [facility];  Service: Cardiovascular;  Laterality: N/A;   TEE WITHOUT CARDIOVERSION N/A 05/21/2018   Procedure: TRANSESOPHAGEAL ECHOCARDIOGRAM (TEE);  Surgeon: Jolaine Artist, MD;  Location: [facility] ENDOSCOPY;  Service: Cardiovascular;  Laterality: N/A;   UPPER GASTROINTESTINAL ENDOSCOPY  07/12/2017   Patchy areas of mucosal inflammation noted in the antrum with edema,erthema and ulcerations. Bx. Chronicfocally active gastritis.   VASECTOMY      Family History  Problem Relation Age of Onset   Asthma Mother    Arthritis Mother    Heart attack Father    Hypertension Father    Stroke Paternal Grandmother    Colon cancer Neg Hx     Allergies  Allergen Reactions   Augmentin [Amoxicillin-Pot Clavulanate] Anaphylaxis and Diarrhea    "Upset stomach"   Chicken Allergy Other (See Comments)   Pantoprazole Sodium Nausea Only    Gets gassy and starting itching like crazy Gets gassy and starting itching like crazy   Valium [Diazepam] Other (See Comments)    HA and Abd pain.   Tape Rash and Other (See Comments)    Surgical tape   Wound Dressing Adhesive Other (See Comments) and Rash    Surgical tape Surgical tape Surgical tape    Current Outpatient Medications on File Prior to Visit  Medication Sig Dispense Refill   acetaminophen (TYLENOL) 500 MG tablet Take 500-1,000 mg by mouth every 6 (six) hours as needed (pain.).     albuterol (VENTOLIN HFA) 108 (90 Base) MCG/ACT inhaler Inhale 2 puffs into the lungs every 6 (six) hours as needed for wheezing or shortness of breath. 8 g 6   ammonium lactate (LAC-HYDRIN) 12 % lotion Apply 1 application topically as needed for dry skin.     Ascorbic Acid (VITAMIN C GUMMIE PO) Take 2 tablets by mouth daily at 12 noon.     aspirin 81 MG EC tablet Take 1 tablet (81 mg total) by mouth in the morning. 30 tablet 2   busPIRone (BUSPAR) 7.5 MG tablet Take 1  tablet (7.5 mg total) by mouth 2 (two) times daily. 14 tablet 0   Cholecalciferol (VITAMIN D3) 50 MCG (2000 UT) TABS Take 2,000 Units by mouth daily at 12 noon.     Coenzyme Q10-Vitamin E (QUNOL ULTRA COQ10 PO) Take 1 capsule by mouth in the morning.     dapagliflozin propanediol (FARXIGA) 10 MG TABS tablet Take 1 tablet (10 mg total) by mouth daily before breakfast. 30 tablet 6   famotidine (PEPCID) 20 MG tablet TAKE 1 TABLET(20 MG) BY MOUTH DAILY 90 tablet 0   ferrous sulfate 325 (65 FE) MG tablet Take 1 tablet (325 mg total) by mouth every other day. 15 tablet 2   KRILL OIL PO Take 800 mg by mouth every other day. In the morning     Magnesium 200 MG CHEW Chew 400 mg by mouth daily at 12 noon.     midodrine (PROAMATINE) 5 MG tablet Take 3 tablets (15 mg total) by mouth 3 (three) times daily with meals. 270 tablet 2   ondansetron (ZOFRAN) 8 MG tablet Take 1 tablet (8 mg total) by mouth every  8 (eight) hours as needed for nausea or vomiting. 20 tablet 0   oxyCODONE (OXY IR/ROXICODONE) 5 MG immediate release tablet Take 1 tablet (5 mg total) by mouth every 6 (six) hours as needed for breakthrough pain or moderate pain. 10 tablet 0   RABEprazole (ACIPHEX) 20 MG tablet Take 1 tablet (20 mg total) by mouth daily. 90 tablet 3   rosuvastatin (CRESTOR) 5 MG tablet Take 1 tablet (5 mg total) by mouth daily. 30 tablet 2   Selexipag 200 MCG TABS Take 1 tablet (200 mcg total) by mouth 2 (two) times daily. 60 tablet    spironolactone (ALDACTONE) 25 MG tablet Take 1 tablet (25 mg total) by mouth daily. 30 tablet 0   tadalafil, PAH, (ADCIRCA) 20 MG tablet Take 40 mg by mouth in the morning.     torsemide (DEMADEX) 20 MG tablet Take 1 tablet (20 mg total) by mouth daily. 30 tablet 0   Turmeric 500 MG TABS Take 1 capsule by mouth daily in the afternoon. With Ginger 50 mg     vitamin B-12 (CYANOCOBALAMIN) 1000 MCG tablet Take 1,000 mcg by mouth daily at 12 noon.     warfarin (COUMADIN) 5 MG tablet See admin  instructions. Take a full tablet 2 days a week and 1/2 tablet 5 days a week. 20 tablet 2   Zinc 50 MG TABS Take 50 mg by mouth every other day. In the afternoon     No current facility-administered medications on file prior to visit.    BP 107/70    Pulse 76    Resp 18    Ht _0  (1.753 m)    Wt 114 lb 12.8 oz (52.1 kg)    SpO2 97%    BMI 16.95 kg/m       Objective:   Physical Exam  General Mental Status- Alert. General Appearance- Not in acute distress.   Skin General: Color- Normal Color. Moisture- Normal Moisture.  Neck Carotid Arteries- Normal color. Moisture- Normal Moisture. No carotid bruits. No JVD.  Chest and Lung Exam Auscultation: Breath Sounds:-Normal.  Cardiovascular Auscultation:Rythm- Regular. Murmurs & Other Heart Sounds:Auscultation of the heart reveals- No Murmurs.  Abdomen Inspection:-Inspeection Normal. Palpation/Percussion:Note:No mass. Palpation and Percussion of the abdomen reveal- Non Tender, Non Distended + BS, no rebound or guarding.   Neurologic Cranial Nerve exam:- CN III-XII intact(No nystagmus), symmetric smile. Strength:- 5/5 equal and symmetric strength both upper and lower extremities.   Left lower ext- no edema of lower ext, no redness and negative homans sign.     Assessment & Plan:   Patient Instructions  Follow-up from recent hospitalization.  Patient has anemia, protein malnutrition, chronic CKD with recent acute injury, left rib pain x1 week post fall, CHF, hypomagnesia, neuropathy, restless leg, low iron, B12 deficiency and anemia.  We will get CBC, iron level, B12 level, CMP, BNP, chest x-ray and left rib series.  Continue current medications as advised on hospital discharge.  Adding gabapentin 100 mg tablet to use 1 tablet at night.  Will see if this helps neuropathy and restless leg symptoms.  Explained sedation is common side effect so be careful if he has to get up at night to use restroom.  Get balance and would  recommend keeping walker by your bedside.  If x-rays show fracture consider use of narcotic.  Keep follow-up appointment with cardiologist next week.  If significant abnormality on labs we will update a cardiologist before appointment.  We did also talk with home health  and notified them that  pt states that he can put his dog in room and closed the door prior to home health coming out.  I recommended to give patient heads up that they are coming out so he can have the dogs in his room before they arrive.  Follow-up date with me to be determined after lab and imaging review.

## 2022-02-27 ENCOUNTER — Telehealth: Payer: Self-pay | Admitting: Medical

## 2022-02-27 NOTE — Telephone Encounter (Signed)
Pt states Gabapentin only last 3/4 hours ? ?Pt would like stronger meds prescribed  ? ?Please advise  ?

## 2022-02-27 NOTE — Telephone Encounter (Signed)
Patient called back regarding his pain medication, he was advised to wait for a call tomorrow.  ?

## 2022-02-28 ENCOUNTER — Ambulatory Visit (INDEPENDENT_AMBULATORY_CARE_PROVIDER_SITE_OTHER): Payer: Medicare Other

## 2022-02-28 ENCOUNTER — Other Ambulatory Visit: Payer: Self-pay

## 2022-02-28 DIAGNOSIS — Z5181 Encounter for therapeutic drug level monitoring: Secondary | ICD-10-CM | POA: Diagnosis not present

## 2022-02-28 DIAGNOSIS — I482 Chronic atrial fibrillation, unspecified: Secondary | ICD-10-CM | POA: Diagnosis not present

## 2022-02-28 LAB — POCT INR: INR: 3.7 — AB (ref 2.0–3.0)

## 2022-02-28 NOTE — Telephone Encounter (Signed)
Pt called back and notified of med change , also told him if it isnt working to call us back ?

## 2022-02-28 NOTE — Telephone Encounter (Signed)
Pt called and lvm to return call 

## 2022-02-28 NOTE — Patient Instructions (Addendum)
Description   ?Hold today's tablet and then START taking 1/2 tablet daily.  ?Please call our office with any medication changes or concerns ? [phone]  ?Return in 1 week for INR check. ?  ?   ?

## 2022-03-01 ENCOUNTER — Encounter (HOSPITAL_COMMUNITY): Payer: Medicare Other | Admitting: Internal Medicine

## 2022-03-01 NOTE — Telephone Encounter (Signed)
Pt states he thinks gabapentin is making him SOB. Transferred to triage. Please advise.  ?

## 2022-03-01 NOTE — Telephone Encounter (Signed)
Spoke with pt his oxygen level currently is 98%, doesn't want to take gabapentin due to SOB , no wheezing .. no other side effects .[name]  ?

## 2022-03-03 NOTE — Telephone Encounter (Signed)
Pt called and lvm to return call 

## 2022-03-03 NOTE — Telephone Encounter (Signed)
Spoke with Pieter Partridge at [facility] and he took the verbal for the medication ?

## 2022-03-03 NOTE — Telephone Encounter (Signed)
Pt called back , stated he isnt taking oxycodone and would like to try tramadol and made him aware to call us back if he has any side effects to that as well ? ?

## 2022-03-05 NOTE — Progress Notes (Unsigned)
Cardiology Office Note:    Date:  03/05/2022   ID:  Lance Hicks, Lance Hicks 02/27/1946, MRN [id number]  PCP:  Lance Pai, PA-C  Cardiologist:  Lance More, MD    Referring MD: Lance Pai, PA-C    ASSESSMENT:    1. Atypical atrial flutter ([place])   2. Chronic anticoagulation   3. S/P AVR   4. Coronary artery disease involving native coronary artery of native heart with angina pectoris ([place])   5. Hx of CABG   6. Pulmonary hypertension ([place])   7. Hypertensive heart disease with heart failure ([place])   8. Stage 3a chronic kidney disease ([place])   9. Other specified hypotension    PLAN:    In order of problems listed above:  ***   Next appointment: ***   Medication Adjustments/Labs and Tests Ordered: Current medicines are reviewed at length with the patient today.  Concerns regarding medicines are outlined above.  No orders of the defined types were placed in this encounter.  No orders of the defined types were placed in this encounter.   No chief complaint on file.   History of Present Illness:    Lance Hicks is a 76 y.o. male with a hx of chronic atrial flutter chronic anticoagulation with warfarin CAD with CABG and mechanical AVR Carbomedics #25 in 2010 with Maze procedure and left atrial appendage occlusion device as well as pulmonary artery hypertension CKD and symptomatic orthostatic hypotension requiring midodrine support.  last seen ***. Compliance with diet, lifestyle and medications: ***  His most reconsidered echocardiogram 09/30/2021 showed left ventricular ejection fraction 45 to 50% with mild LVH, low normal to mildly reduced right ventricular systolic function and mild enlargement of right atrium and severely dilated left atrium mildly dilated.  He has severe mitral annular calcification without stenosis moderate to severe tricuspid regurgitation and normal AVR function.  1. Left ventricular ejection fraction, by estimation, is 45 to 50%. The   left ventricle has mildly decreased function. The left ventricle  demonstrates global hypokinesis. There is mild left ventricular  hypertrophy. Left ventricular diastolic parameters  are consistent with Grade II diastolic dysfunction (pseudonormalization).   2. Right ventricular systolic function is low normal. The right  ventricular size is mildly enlarged.   3. Left atrial size was mildly dilated.   4. Right atrial size was severely dilated.   5. The mitral valve is normal in structure. No evidence of mitral valve  regurgitation. No evidence of mitral stenosis. Severe mitral annular  calcification.   6. Tricuspid valve regurgitation is moderate to severe.   7. The aortic valve has been repaired/replaced. Aortic valve  regurgitation is not visualized. No aortic stenosis is present. There is a  Carbomedics bileaflet valve present in the aortic position. Procedure  Date: 2010. Echo findings are consistent with  normal structure and function of the aortic valve prosthesis. Aortic valve  mean gradient measures 9.7 mmHg. Aortic valve Vmax measures 2.01 m/s.   8. There is Moderate (Grade III) plaque involving the ascending aorta.   9. The inferior vena cava is dilated in size with <50% respiratory  variability, suggesting right atrial pressure of 15 mmHg.  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 In light of concurrent left nephrolithiasis, will check CT renal colic next  year instead of renal US.    Anticoagulated on Coumadin 01/04/2018   Anxiety 05/10/2016   Last Assessment & Plan:  Doing well off of zoloft.   Asthma    Asymptomatic microscopic hematuria 06/20/2017   Last Assessment & Plan:  Had hematuria workup in [facility] in 2016 which negative CT and cystoscopy. UA with 2+ blood last visit - we  discussed recommendation for repeat workup at 5 years or if degree of hematuria progresses.    Atrial fibrillation ([place]) 03/29/2017   Atrial flutter ([place]) 03/29/2017   Backache 12/14/2015   Last Assessment & Plan:  Pain management referral for further evaluation.   Bilateral pleural effusion 08/09/2017   CHF (congestive heart failure) (HCC)    Chronic allergic rhinitis 04/28/2016   Last Assessment & Plan:  Continue astelin   Chronic anticoagulation 03/29/2017   Chronic atrial fibrillation ([place]) 07/23/2015   Last Assessment & Plan:  Coumadin and metoprolol, cardiology referral to establish care.   Chronic midline back pain 12/14/2015   Last Assessment & Plan:  Pain management referral for further evaluation.   Chronic obstructive lung disease ([place]) 06/12/2016   With hypoxia   Chronic prostatitis 07/23/2015   Last Assessment & Plan:  Has largely resolved since stopping bike riding. Recommend annual DRE AND PSA - will see back 12/2015 for annual screening, given 1st degree fhx. To call office for recurrent prostatitis symptoms.    Chronic respiratory failure with hypoxia ([place]) 07/29/2019   Cirrhosis of liver not due to alcohol ([place]) 07/01/2019   COPD (chronic obstructive pulmonary disease) ([place]) 06/12/2016   Coronary arteriosclerosis in native artery 03/29/2017   Coronary artery disease involving native coronary artery of native heart with angina pectoris ([place]) 03/29/2017   Cough 10/17/2016   Last Assessment & Plan:  Discussed typical course for acute viral illness. If symptoms worsen or fail to improve by 7-10d, delayed ATBs, fluids, rest, NSAIDs/APAP prn. Seek care if not improving. Needs earlier INR check due to ATBs.   Dyspnea 02/01/2016   Last Assessment & Plan:  Overall improving, eval by pulm, plan for CT, neg stress test with cardiology. Recent switch to carvedilol due to side effects.   Encounter for therapeutic drug monitoring 01/06/2019   Enterococcal bacteremia    Epidermoid cyst of skin 08/24/2017    Essential hypertension 12/14/2015   Last Assessment & Plan:  Hypertension control: controlled  Medications: compliant Medication Management: as noted in orders Home blood pressure monitoring recommended additionally as needed for symptoms  The patient's care plan was reviewed and updated. Instructions and counseling were provided regarding patient goals and barriers. He was counseled to adopt a healthy lifestyle. Educational resources and self-management tools have been provided as charted in [facility] list.    H/O maze procedure 03/29/2017   [name] 03/29/2017   Overview:  2011   History of coronary artery bypass graft 03/29/2017   Hx of CABG 03/29/2017   Hyperlipidemia 03/29/2017   Hypersensitivity angiitis ([place]) 10/01/2017   Hypertensive heart disease 03/29/2017   Hypertensive heart disease with heart failure ([place]) 03/29/2017   Hypertensive heart failure ([place]) 03/29/2017   Hypokalemia due to excessive renal loss of potassium 02/18/2018   Hypotension, chronic 06/06/2018   International normalized ratio (INR) raised 07/26/2017   Kidney stone 07/23/2015   Kidney stones 07/23/2015   Overview:  x 3  Last Assessment & Plan:  By [place] has left nephrolithiasis, but not visible by KUB. Will check CT renal colic next year to  assess both stone burden as well as to surveil AML.    Left ureteral stone 01/23/2018   Leukocytoclastic vasculitis ([place]) 10/01/2017   Localized edema 01/04/2018   Long term (current) use of anticoagulants 03/29/2017   Lumbar radicular pain 01/19/2016   Lumbar radiculopathy 01/19/2016   Maculopapular rash 09/03/2017   Microscopic hematuria 06/20/2017   Last Assessment & Plan:  Had hematuria workup in [facility] in 2016 which negative CT and cystoscopy. UA with 2+ blood last visit - we discussed recommendation for repeat workup at 5 years or if degree of hematuria progresses.    Multiple nodules of lung 06/12/2016   Nasal discharge 02/25/2016   Last Assessment & Plan:  Trial zyrtec and flonase    Nephrolithiasis 07/23/2015   Overview:  x 3  Last Assessment & Plan:  By [place] has left nephrolithiasis, but not visible by KUB. Will check CT renal colic next year to assess both stone burden as well as to surveil AML.   Overview:  x 3  Last Assessment & Plan:  Has 88m nonobstructing LUP stone - not visible by KUB.  Will check renal [place] - he will contact office sooner if symptomatic.    Non-sustained ventricular tachycardia 03/29/2017   Nonsustained ventricular tachycardia 03/29/2017   Other hyperlipidemia 03/29/2017   Palpitations 10/01/2017   Pleural effusion, bilateral 08/09/2017   Pneumonia due to COVID-19 virus 02/07/2021   Post-nasal drainage 02/25/2016   Last Assessment & Plan:  Trial zyrtec and flonase   Prostate cancer screening 06/20/2017   Last Assessment & Plan:  Recommend continued annual CaP screening until within 10 years of life expectancy. Given good health and fhx of longevity, would anticipate CaP screening to continue until age 89  PSA today and again in one year on day of visit.   Pulmonary arterial hypertension ([place] 02/20/2018   Pulmonary edema    Pulmonary hypertension ([place] 08/09/2017   Pulmonary nodules 06/12/2016   S/P AVR 03/20/2016   S/P AVR (aortic valve replacement) 03/20/2016   Strain of deltoid muscle, initial encounter    Supratherapeutic INR 07/26/2017   Syncope and collapse 02/01/2016   Typical atrial flutter ([place] 02/01/2016    Past Surgical History:  Procedure Laterality Date   CHOLECYSTECTOMY     CORONARY ARTERY BYPASS GRAFT     EXPLORATORY LAPAROTOMY  07/30/2017   FOOT SURGERY     FRACTURE SURGERY Right    wrist and forearm   HERNIA REPAIR     MECHANICAL AORTIC VALVE REPLACEMENT     NASAL SINUS SURGERY     RIGHT HEART CATH N/A 01/18/2018   Procedure: RIGHT HEART CATH;  Surgeon: BJolaine Artist MD;  [facility];  Service: Cardiovascular;  Laterality: N/A;   RIGHT HEART CATH N/A 05/14/2018   Procedure: RIGHT HEART CATH;  Surgeon:  BJolaine Artist MD;  [facility];  Service: Cardiovascular;  Laterality: N/A;   RIGHT HEART CATH N/A 09/29/2021   Procedure: RIGHT HEART CATH;  Surgeon: BJolaine Artist MD;  [facility];  Service: Cardiovascular;  Laterality: N/A;   TEE WITHOUT CARDIOVERSION N/A 05/21/2018   Procedure: TRANSESOPHAGEAL ECHOCARDIOGRAM (TEE);  Surgeon: BJolaine Artist MD;  [facility];  Service: Cardiovascular;  Laterality: N/A;   UPPER GASTROINTESTINAL ENDOSCOPY  07/12/2017   Patchy areas of mucosal inflammation noted in the antrum with edema,erthema and ulcerations. Bx. Chronicfocally active gastritis.   VASECTOMY      Current Medications: No outpatient  medications have been marked as taking for the 03/06/22 encounter (Appointment) with Richardo Priest, MD.     Allergies:   Augmentin [amoxicillin-pot clavulanate], Chicken allergy, Pantoprazole sodium, Valium [diazepam], Tape, and Wound dressing adhesive   Social History   Socioeconomic History   Marital status: Single    Spouse name: Not on file   Number of children: Not on file   Years of education: Not on file   Highest education level: Not on file  Occupational History   Not on file  Tobacco Use   Smoking status: Former    Packs/day: 2.00    Years: 34.00    Pack years: 68.00    Types: Cigarettes    Quit date: 07/16/2000    Years since quitting: 21.6   Smokeless tobacco: Never  Vaping Use   Vaping Use: Never used  Substance and Sexual Activity   Alcohol use: Not Currently   Drug use: No   Sexual activity: Not on file  Other Topics Concern   Not on file  Social History Narrative   Not on file   Social Determinants of Health   Financial Resource Strain: Low Risk    Difficulty of Paying Living Expenses: Not very hard  Food Insecurity: No Food Insecurity   Worried About Running Out of Food in the Last Year: Never true   Ran Out of Food in the Last Year: Never true   Transportation Needs: No Transportation Needs   Lack of Transportation (Medical): No   Lack of Transportation (Non-Medical): No  Physical Activity: Not on file  Stress: Not on file  Social Connections: Not on file     Family History: The patient's ***family history includes Arthritis in his mother; Asthma in his mother; Heart attack in his father; Hypertension in his father; Stroke in his paternal grandmother. There is no history of Colon cancer. ROS:   Please see the history of present illness.    All other systems reviewed and are negative.  EKGs/Labs/Other Studies Reviewed:    The following studies were reviewed today: Studies: - Echo (4/22): EF 50-55%, RV ok, severe RVSP 61.6 mmHg, severe biatrial enlargement, moderate TR, stable AVR, AV mean gradient 11 mmHg   - Echo (3/21): EF 45-50% AVR ok. Septal flattening RV dilated mild HK. Moderate TR RVSP 80 severe bilateral enlargement.    - RHC 05/14/18 showed Mild/Moderate PAH in setting of high output with no evidence of intracardiac shunting. Hemodynamics as below.    - High-Res Chest CT 05/16/18 1. Slightly irregular 0.9 cm peripheral right middle lobe solid pulmonary nodule. 3 month follow up recommended.  2. Dependent basilar predominant patchy subpleural reticulation and ground-glass attenuation with the suggestion of minimal associated traction bronchiolectasis. No frank honeycombing. Findings may indicate an interstitial lung disease such as early usual interstitial pneumonia (UIP) or fibrotic phase nonspecific interstitial pneumonia (NSIP). Suggest a follow-up high-resolution chest CT study in 6-12 months to assess temporal pattern stability, as clinically warranted. 3. Mild cardiomegaly. Minimal interlobular septal thickening and trace dependent bilateral pleural effusions.  4. Small to moderate volume perihepatic ascites. 5. Ectatic 4.3 cm ascending thoracic aorta. Recommend annual imaging followup by CTA or MRA.  6.  Left main and 3 vessel coronary atherosclerosis.   - US Abdomen RUQ 05/16/18 - s/p cholecystectomy. + hepatic cirrhosis. Mild ascites.    - Liver Doppler 05/16/18 - No hepatic, splenic, or portal venous thrombosis or occlusion. Mild ascites.   - Echo with bubble study 05/16/18 LVEF 55-60% with "  late bubbles" , Mechanical AVR stable with trivial perivalvular regurg, Mild MR, Severe LAE, Severe RV dilation and reduced function. No PFO. Mod TR, PA peak pressure 68 mm Hg   - RHC 05/14/18 RA = 18 RV = 71/17 PA = 69/24 (39) PCW = 20 Fick cardiac output/index = 7.0/3.7 Thermo CO/CI = 7.2/3.8 PVR = 2.6 WU Ao sat = 97% PA sat =  73%, 73% SVC sat = 73%  RA sat = 72%   - RHC 01/18/18 RA = 23 RV = 84/21 PA = 81/38 (53) PCW = 22 Fick cardiac output/index = 3.0/1.6 PVR = 10.4 WU FA sat = 98% on 2L  (checked on RA = 93%) PA sat = 60%, 61% SVC sat = 62%   -14 Day Patch 10/01/2017-10/15/2017: -1 run of Ventricular Tachycardia occurred lasting 10 beats with a max rate of 240 bpm (avg 161 bpm). -Atrial Flutter occurred continuously (100% burden), ranging from 55-132 bpm (avg of 87 bpm).   - NM Ventilation and Perfusion Lung Scan 10/31/2017:  Low probability of pulmonary embolism, using the PIOPED criteria.   - PFTs 07/02/2017:       FVC     86% FEV1   85% FEV1/FVC  73% DLCO  8.2, 32%   - 6MW -  1400 feet; 97% to 85% (recovered with resting)                                    CT Chest 07/02/2017: 1. Stable right middle lobe pulmonary nodule measuring up to 1 cm. 2. Minimal subpleural reticulation without evidence of honeycombing to  suggest pulmonary fibrosis. Stable centrilobular emphysema. 3. Mediastinal lymphadenopathy, increased from prior. 4. Calcified right hilar lymph nodes and multiple calcified granulomas  throughout the right lung consistent with prior granulomatous disease. 5. Ascending thoracic aortic aneurysm measuring up to 4.5 cm, unchanged. 6. Dilatation of the main  pulmonary artery suggestive of pulmonary  hypertension. 7. Cardiomegaly and dense atherosclerotic coronary artery calcifications.  EKG:  EKG ordered today and personally reviewed.  The ekg ordered today demonstrates ***  Recent Labs:  Component Ref Range & Units 5 d ago (02/28/22) 12 d ago (02/21/22) 4 wk ago (02/05/22) 4 wk ago (02/04/22) 1 mo ago (02/03/22) 1 mo ago (02/02/22) 1 mo ago (02/01/22)  INR 2.0 - 3.0 3.7 Abnormal   2.1  2.8 High  R, CM  1.9 High  R, CM  1.6 High  R, CM  1.9 High  R, CM  2.5 High  R,    08/17/2021: TSH 2.71 01/23/2022: B Natriuretic Peptide 1,831.8 02/23/2022: ALT 13; BUN 17; Creatinine, Ser 1.62; Hemoglobin 11.4; Magnesium 2.1; Platelets 227.0; Potassium 4.7; Pro B Natriuretic peptide (BNP) 891.0; Sodium 130  Recent Lipid Panel    Component Value Date/Time   CHOL 133 10/12/2020 1104   TRIG 111 10/12/2020 1104   HDL 44 10/12/2020 1104   CHOLHDL 3.0 10/12/2020 1104   VLDL 11 10/21/2018 1028   LDLCALC 70 10/12/2020 1104    Physical Exam:    VS:  There were no vitals taken for this visit.    Wt Readings from Last 3 Encounters:  02/23/22 114 lb 12.8 oz (52.1 kg)  02/21/22 115 lb (52.2 kg)  02/05/22 117 lb 11.6 oz (53.4 kg)     GEN: *** Well nourished, well developed in no acute distress HEENT: Normal NECK: No JVD; No carotid bruits LYMPHATICS: No lymphadenopathy  CARDIAC: ***RRR, no murmurs, rubs, gallops RESPIRATORY:  Clear to auscultation without rales, wheezing or rhonchi  ABDOMEN: Soft, non-tender, non-distended MUSCULOSKELETAL:  No edema; No deformity  SKIN: Warm and dry NEUROLOGIC:  Alert and oriented x 3 PSYCHIATRIC:  Normal affect    Signed, Lance More, MD  03/05/2022 10:18 AM    [place]

## 2022-03-06 ENCOUNTER — Ambulatory Visit: Payer: Medicare Other | Admitting: Cardiology

## 2022-03-06 DIAGNOSIS — I25119 Atherosclerotic heart disease of native coronary artery with unspecified angina pectoris: Secondary | ICD-10-CM

## 2022-03-06 DIAGNOSIS — Z952 Presence of prosthetic heart valve: Secondary | ICD-10-CM

## 2022-03-06 DIAGNOSIS — I484 Atypical atrial flutter: Secondary | ICD-10-CM

## 2022-03-06 DIAGNOSIS — I9589 Other hypotension: Secondary | ICD-10-CM

## 2022-03-06 DIAGNOSIS — N1831 Chronic kidney disease, stage 3a: Secondary | ICD-10-CM

## 2022-03-06 DIAGNOSIS — Z7901 Long term (current) use of anticoagulants: Secondary | ICD-10-CM

## 2022-03-06 DIAGNOSIS — I272 Pulmonary hypertension, unspecified: Secondary | ICD-10-CM

## 2022-03-06 DIAGNOSIS — I110 Hypertensive heart disease with heart failure: Secondary | ICD-10-CM

## 2022-03-06 DIAGNOSIS — Z951 Presence of aortocoronary bypass graft: Secondary | ICD-10-CM

## 2022-03-07 ENCOUNTER — Telehealth: Payer: Self-pay | Admitting: Medical

## 2022-03-07 NOTE — Telephone Encounter (Signed)
Pt states he will no longer take Tramadol due to many side effects hyperventilation, night sweats, and constipation  ? ? ? ?

## 2022-03-08 NOTE — Telephone Encounter (Signed)
Spoke with pt and he stated he believes its the medication and not possible CHF flare up, and states he will seek medical attention if sx get worse or happen again ?

## 2022-03-14 ENCOUNTER — Ambulatory Visit (INDEPENDENT_AMBULATORY_CARE_PROVIDER_SITE_OTHER): Payer: Medicare Other

## 2022-03-14 DIAGNOSIS — Z7901 Long term (current) use of anticoagulants: Secondary | ICD-10-CM

## 2022-03-14 DIAGNOSIS — I4892 Unspecified atrial flutter: Secondary | ICD-10-CM | POA: Diagnosis not present

## 2022-03-14 LAB — POCT INR: INR: 1.2 — AB (ref 2.0–3.0)

## 2022-03-14 NOTE — Patient Instructions (Addendum)
Description   ?Do not drink Ensure or eat greens today or tomorrow. Take 1 tablets today and then START taking 1/2 tablet daily EXCEPT 1 tablet on Mondays, Wednesdays and Saturdays.  ?Please call our office with any medication changes or concerns ? [phone]  ?Return in 1 week for INR check. ?  ?   ?

## 2022-03-21 ENCOUNTER — Ambulatory Visit (INDEPENDENT_AMBULATORY_CARE_PROVIDER_SITE_OTHER): Payer: Medicare Other

## 2022-03-21 ENCOUNTER — Other Ambulatory Visit: Payer: Self-pay

## 2022-03-21 DIAGNOSIS — I482 Chronic atrial fibrillation, unspecified: Secondary | ICD-10-CM

## 2022-03-21 DIAGNOSIS — I484 Atypical atrial flutter: Secondary | ICD-10-CM

## 2022-03-21 DIAGNOSIS — Z7901 Long term (current) use of anticoagulants: Secondary | ICD-10-CM | POA: Diagnosis not present

## 2022-03-21 LAB — POCT INR: INR: 2.2 (ref 2.0–3.0)

## 2022-03-21 NOTE — Patient Instructions (Signed)
Description   ?Take 1 tablet today and then continue taking 1/2 tablet daily EXCEPT 1 tablet on Mondays, Wednesdays and Saturdays.  ?Please call our office with any medication changes or concerns ? [phone]  ?Stay consistent - Ensure every other day  ?Return in 2 weeks for INR check. ?  ?   ?

## 2022-03-22 ENCOUNTER — Other Ambulatory Visit: Payer: Self-pay | Admitting: Medical

## 2022-03-23 ENCOUNTER — Ambulatory Visit (HOSPITAL_COMMUNITY)
Admission: RE | Admit: 2022-03-23 | Discharge: 2022-03-23 | Disposition: A | Discharge status: Home / Self Care | Payer: Medicare Other | Source: Ambulatory Visit | Attending: Internal Medicine | Admitting: Internal Medicine

## 2022-03-23 ENCOUNTER — Encounter (HOSPITAL_COMMUNITY): Payer: Self-pay | Admitting: Internal Medicine

## 2022-03-23 VITALS — BP 90/60 | HR 70 | Wt 118.4 lb

## 2022-03-23 DIAGNOSIS — Z09 Encounter for follow-up examination after completed treatment for conditions other than malignant neoplasm: Secondary | ICD-10-CM | POA: Insufficient documentation

## 2022-03-23 DIAGNOSIS — R188 Other ascites: Secondary | ICD-10-CM | POA: Insufficient documentation

## 2022-03-23 DIAGNOSIS — Z87891 Personal history of nicotine dependence: Secondary | ICD-10-CM | POA: Diagnosis not present

## 2022-03-23 DIAGNOSIS — Z952 Presence of prosthetic heart valve: Secondary | ICD-10-CM | POA: Insufficient documentation

## 2022-03-23 DIAGNOSIS — I482 Chronic atrial fibrillation, unspecified: Secondary | ICD-10-CM

## 2022-03-23 DIAGNOSIS — I493 Ventricular premature depolarization: Secondary | ICD-10-CM | POA: Diagnosis not present

## 2022-03-23 DIAGNOSIS — K7460 Unspecified cirrhosis of liver: Secondary | ICD-10-CM | POA: Diagnosis not present

## 2022-03-23 DIAGNOSIS — Z8616 Personal history of COVID-19: Secondary | ICD-10-CM | POA: Diagnosis not present

## 2022-03-23 DIAGNOSIS — N1831 Chronic kidney disease, stage 3a: Secondary | ICD-10-CM | POA: Insufficient documentation

## 2022-03-23 DIAGNOSIS — I5042 Chronic combined systolic (congestive) and diastolic (congestive) heart failure: Secondary | ICD-10-CM | POA: Diagnosis not present

## 2022-03-23 DIAGNOSIS — Z7901 Long term (current) use of anticoagulants: Secondary | ICD-10-CM | POA: Diagnosis not present

## 2022-03-23 DIAGNOSIS — I350 Nonrheumatic aortic (valve) stenosis: Secondary | ICD-10-CM | POA: Diagnosis not present

## 2022-03-23 DIAGNOSIS — Z79899 Other long term (current) drug therapy: Secondary | ICD-10-CM | POA: Insufficient documentation

## 2022-03-23 DIAGNOSIS — I272 Pulmonary hypertension, unspecified: Secondary | ICD-10-CM | POA: Diagnosis not present

## 2022-03-23 DIAGNOSIS — J432 Centrilobular emphysema: Secondary | ICD-10-CM | POA: Insufficient documentation

## 2022-03-23 DIAGNOSIS — J9610 Chronic respiratory failure, unspecified whether with hypoxia or hypercapnia: Secondary | ICD-10-CM | POA: Diagnosis not present

## 2022-03-23 DIAGNOSIS — I130 Hypertensive heart and chronic kidney disease with heart failure and stage 1 through stage 4 chronic kidney disease, or unspecified chronic kidney disease: Secondary | ICD-10-CM | POA: Diagnosis not present

## 2022-03-23 DIAGNOSIS — E871 Hypo-osmolality and hyponatremia: Secondary | ICD-10-CM | POA: Diagnosis not present

## 2022-03-23 DIAGNOSIS — E7849 Other hyperlipidemia: Secondary | ICD-10-CM | POA: Insufficient documentation

## 2022-03-23 DIAGNOSIS — I2510 Atherosclerotic heart disease of native coronary artery without angina pectoris: Secondary | ICD-10-CM | POA: Diagnosis not present

## 2022-03-23 DIAGNOSIS — D509 Iron deficiency anemia, unspecified: Secondary | ICD-10-CM | POA: Insufficient documentation

## 2022-03-23 DIAGNOSIS — I5022 Chronic systolic (congestive) heart failure: Secondary | ICD-10-CM | POA: Diagnosis not present

## 2022-03-23 DIAGNOSIS — M310 Hypersensitivity angiitis: Secondary | ICD-10-CM | POA: Insufficient documentation

## 2022-03-23 DIAGNOSIS — Z951 Presence of aortocoronary bypass graft: Secondary | ICD-10-CM | POA: Diagnosis not present

## 2022-03-23 NOTE — Progress Notes (Signed)
? ? ?Advanced Heart Failure Clinic Note ? ? ?Date:  03/23/2022  ? ?PCP:  Lance Pai, PA-C  ?Pulmonology: Dr. Lamonte Hicks ?Cardiologist:  Dr. Bettina Hicks  ?HF Cardiologist: Dr. Haroldine Hicks ? 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 Also has COPD (quit smoking in 2002 - 1 ppd x 25 years), HL, and HTN. ? ?In 8/18 admitted for acute gallstone pancreatitis. Underwent lap cholecystectomy on 07/29/2017. On 8/6 he developed hypotension and was taken back for open laparotomy due to bleeding from an unclear omental source. He was given fluids and 8 units PRBC perioperatively. He says his breathing has been bad since that time.  ? ?Seen at [place] in 2018 had hi-res CT, PFTs and VQ (low prob). Felt to have [place] I COPD with asthma overlap and early pulmonary fibrosis with PAH. He was also seen by Cardiology. Echo 8/18 as below showed normal LV function with septal flattening and RVSP 65-6mHG. 14-day event monitor showed 10-beat run NSVT. Felt to have chronic AF and PAH. No further testing ordered. ? ?Underwent RHC on 01/18/18 with severe PAH and cor pulmonale. Macitentan and Adcirca. Subsequently started on selexipag but dose limited by cramping now on 400/200 ? ?Admitted 5/19 with symptomatic hypotension and weight gain. He had a repeat RHC and echo with bubble study (results below). He was started on midodrine for BP support. Diuresed 25 lbs with IV lasix, then transitioned to torsemide 60 mg daily. Abdominal UKoreashowed cirrhosis with no evidence of portal HTN. Pulm consulted for possible ILD on High-res chest CT. ? ?[facility]. [facility]. On doxy for 5 weeks.  ? ?Seen 09/12/21 with NYHA IIIb-IV symptoms. ReDs 38%. Given home IV lasix with [facility].  ?  ?Admitted from  cath lab 09/29/21 with elevated filling pressures.  Hospitalization c/b hyponatremia requiring tolvaptan. Weight 143 lbs. ?  ?Had post hospital HF on 10/21 and still w/ mild fluid overload and FWilder Gladewas added. Labs showed hyponatremia w/ Na 121. Returned to clinic 11/03/21 feeling poorly and was direct admitted with a/c HF and hyponatremia, ReDs 41%. Sodium up to 129 at discharge, weight 136 lbs. [facility] ? ?Admitted 1/3/0/23 with marked volume overload. Placed on empiric milrinone and diuresed with IV lasix. Hospital course c/b hyponatremia and hypotension. Taken off tadalafil + macitentan due to hypotension. He has placed on low doses of selexipag 200 twice a day + tadalafil. [facility] He then discharged to home.  ? ?Today he returns for HF follow up.Complaining of nervousness. Denies SOB/PND/Orthopnea. Appetite poor.  No fever or chills. Weight at home 119  pounds. Walking on the treadmill 3 times a day 5-9 minutes. Taking all medications. Lives alone. He is driving to appointments. He has neighbors church members help him 2-3 times a day.   ? ?Cardiac Studies: ?- RHC 10/22 ?Findings: ?  ?RA = 12 ?RV = 71/9 ?PA = 79/20 (38) ?PCW = 21 (v = 30) ?Fick cardiac output/index = 7.0/3.14 ?PVR = 3.1 WU ?FA sat = 99% ?PA sat = 70%, 74% ?PAPi = 4.9  ?  ?Assessment: ?1. Moderate mixed PAH with mild to moderate volume overload ?  ?  Echo (4/22): EF 50-55%, RV ok, severe RVSP 61.6 mmHg, severe biatrial enlargement, moderate TR, stable AVR, AV mean gradient 11 mmHg ? ?- Echo (3/21): EF 45-50% AVR ok. Septal flattening RV dilated mild HK. Moderate TR RVSP 80 severe bilateral enlargement.  ? ?- RHC 05/14/18 showed Mild/Moderate PAH in setting of high output with no evidence of intracardiac shunting. Hemodynamics as below.  ?  ?- High-Res Chest CT 05/16/18 ?1. Slightly irregular 0.9 cm peripheral right middle lobe solid ?pulmonary  nodule. 3 month follow up recommended.  ?2. Dependent basilar predominant patchy subpleural reticulation and ?ground-glass attenuation with the suggestion of minimal associated ?traction bronchiolectasis. No frank honeycombing. Findings may ?indicate an interstitial lung disease such as early usual ?interstitial pneumonia (UIP) or fibrotic phase nonspecific ?interstitial pneumonia (NSIP). Suggest a follow-up high-resolution ?chest CT study in 6-12 months to assess temporal pattern stability, ?as clinically warranted. ?3. Mild cardiomegaly. Minimal interlobular septal thickening and ?trace dependent bilateral pleural effusions.  ?4. Small to moderate volume perihepatic ascites. ?5. Ectatic 4.3 cm ascending thoracic aorta. Recommend annual imaging ?followup by CTA or MRA.  ?6. Left main and 3 vessel coronary atherosclerosis. ?  ?- US Abdomen RUQ 05/16/18 ?- s/p cholecystectomy. + hepatic cirrhosis. Mild ascites.  ?  ?- Liver Doppler 05/16/18 ?- No hepatic, splenic, or portal venous thrombosis or occlusion. Mild ascites. ?  ?- Echo with bubble study 05/16/18 LVEF 55-60% with "late bubbles" , Mechanical AVR stable with trivial perivalvular regurg, Mild MR, Severe LAE, Severe RV dilation and reduced function. No PFO. Mod TR, PA peak pressure 68 mm Hg ?  ?- RHC 05/14/18 ?RA = 18 ?RV = 71/17 ?PA = 69/24 (39) ?PCW = 20 ?Fick cardiac output/index = 7.0/3.7 ?Thermo CO/CI = 7.2/3.8 ?PVR = 2.6 WU ?Ao sat = 97% ?PA sat =  73%, 73% ?SVC sat = 73%  ?RA sat = 72% ? ?- RHC 01/18/18 ?RA = 23 ?RV = 84/21 ?PA = 81/38 (53) ?PCW = 22 ?Fick cardiac output/index = 3.0/1.6 ?PVR = 10.4 WU ?FA sat = 98% on 2L  (checked on RA = 93%) ?PA sat = 60%, 61% ?SVC sat = 62% ? ?-14 Day Patch 10/01/2017-10/15/2017: ?-1 run of Ventricular Tachycardia occurred lasting 10 beats with a max rate of 240 bpm (avg 161 bpm). ?-Atrial Flutter occurred continuously (100% burden), ranging from 55-132 bpm (avg of 87 bpm).  ? ?- NM Ventilation and Perfusion Lung Scan  10/31/2017: ? ?Low probability of pulmonary embolism, using the PIOPED criteria. ? ?- PFTs 07/02/2017:      ? ?FVC     86% ?FEV1   85% ?FEV1/FVC  73% ?DLCO  8.2, 32%  ? ?- 6MW -  1400 feet; 97% to 85% (recovered with resting)                                   ? ?CT Chest 07/02/2017: ?1. Stable right middle lobe pulmonary nodule measuring up to 1 cm. ?2. Minimal subpleural reticulation without evidence of honeycombing to  ?suggest pulmonary fibrosis. Stable centrilobular emphysema. ?3. Mediastinal lymphadenopathy, increased from prior. ?4. Calcified right hilar lymph nodes and multiple calcified granulomas  ?throughout the right lung consistent with prior granulomatous disease. ?5. Ascending thoracic aortic aneurysm measuring up to 4.5 cm, unchanged. ?6. Dilatation of the main pulmonary artery suggestive of pulmonary  ?hypertension. ?7. Cardiomegaly and dense atherosclerotic coronary artery calcifications.  ? ?Review of systems complete and found  to be negative unless listed in HPI.  ? ?Past Medical History:  ?Diagnosis Date  ? Allergic rhinitis 04/28/2016  ? Last Assessment & Plan:  Continue astelin  ? Anemia 07/01/2019  ? 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 Last Assessment & Plan:  Stable in size on annual imaging. In light of concurrent left nephrolithiasis, will check CT renal colic next year instead of renal US.   ? Anticoagulated on Coumadin 01/04/2018  ? Anxiety 05/10/2016  ? Last Assessment & Plan:  Doing well off of zoloft.  ? Asthma   ? Asymptomatic microscopic hematuria 06/20/2017  ? Last Assessment & Plan:  Had hematuria workup in [facility] in 2016 which negative CT and cystoscopy. UA with 2+ blood last visit - we discussed recommendation for repeat workup at 5 years or if degree of hematuria progresses.   ? Atrial fibrillation ([place]) 03/29/2017  ? Atrial flutter ([place]) 03/29/2017  ? Backache 12/14/2015  ? Last Assessment & Plan:  Pain management referral for further evaluation.  ? Bilateral pleural effusion  08/09/2017  ? CHF (congestive heart failure) ([place])   ? Chronic allergic rhinitis 04/28/2016  ? Last Assessment & Plan:  Continue astelin  ? Chronic anticoagulation 03/29/2017  ? Chronic atrial fibrillation ([place]) 7/2

## 2022-03-23 NOTE — Patient Instructions (Signed)
Your physician recommends that you schedule a follow-up appointment in: 3 months ? ?If you have any questions or concerns before your next appointment please send [place] a message through [place] or call our office at [phone].   ? ?TO LEAVE A MESSAGE FOR THE NURSE SELECT OPTION 2, PLEASE LEAVE A MESSAGE INCLUDING: ?YOUR NAME ?DATE OF BIRTH ?CALL BACK NUMBER ?REASON FOR CALL**this is important as we prioritize the call backs ? ?YOU WILL RECEIVE A CALL BACK THE SAME DAY AS LONG AS YOU CALL BEFORE 4:00 PM ? ?At the [facility], you and your health needs are our priority. As part of our continuing mission to provide you with exceptional heart care, we have created designated Provider Care Teams. These Care Teams include your primary Cardiologist (physician) and Advanced Practice Providers (APPs- Physician Assistants and Nurse Practitioners) who all work together to provide you with the care you need, when you need it.  ? ?You may see any of the following providers on your designated Care Team at your next follow up: ?Dr Glori Bickers ?Dr Loralie Champagne ?Darrick Grinder, NP ?Lyda Jester, PA ?Jessica Milford,NP ?Marlyce Huge, PA ?Audry Riles, PharmD ? ? ?Please be sure to bring in all your medications bottles to every appointment.  ? ? ?

## 2022-03-24 ENCOUNTER — Other Ambulatory Visit: Payer: Self-pay | Admitting: Emergency Medicine

## 2022-03-26 ENCOUNTER — Other Ambulatory Visit: Payer: Self-pay | Admitting: Medical

## 2022-03-30 ENCOUNTER — Encounter: Payer: Self-pay | Admitting: Podiatry

## 2022-03-30 ENCOUNTER — Ambulatory Visit (INDEPENDENT_AMBULATORY_CARE_PROVIDER_SITE_OTHER): Payer: Medicare Other | Admitting: Podiatry

## 2022-03-30 DIAGNOSIS — T50905A Adverse effect of unspecified drugs, medicaments and biological substances, initial encounter: Secondary | ICD-10-CM | POA: Insufficient documentation

## 2022-03-30 DIAGNOSIS — B351 Tinea unguium: Secondary | ICD-10-CM

## 2022-03-30 DIAGNOSIS — K5790 Diverticulosis of intestine, part unspecified, without perforation or abscess without bleeding: Secondary | ICD-10-CM | POA: Insufficient documentation

## 2022-03-30 DIAGNOSIS — D689 Coagulation defect, unspecified: Secondary | ICD-10-CM

## 2022-03-30 DIAGNOSIS — M79676 Pain in unspecified toe(s): Secondary | ICD-10-CM | POA: Diagnosis not present

## 2022-03-30 DIAGNOSIS — R918 Other nonspecific abnormal finding of lung field: Secondary | ICD-10-CM | POA: Insufficient documentation

## 2022-03-30 DIAGNOSIS — E871 Hypo-osmolality and hyponatremia: Secondary | ICD-10-CM | POA: Insufficient documentation

## 2022-04-03 NOTE — Progress Notes (Signed)
?  Subjective:  ?Patient ID: Lance Hicks, male    DOB: 02/11/1946,  MRN: [id number] ? ?ELAND LAMANTIA presents to clinic today for at risk foot care with h/o coagulation defect and painful elongated mycotic toenails 1-5 bilaterally which are tender when wearing enclosed shoe gear. Pain is relieved with periodic professional debridement. ? ?New problem(s): None.  ? ?PCP is Saguier, Iris Pert , and last visit was February 23, 2022. ? ?Allergies  ?Allergen Reactions  ? Augmentin [Amoxicillin-Pot Clavulanate] Anaphylaxis and Diarrhea  ?  "Upset stomach"  ? Midodrine   ?  Other reaction(s): See Comments  ? Amoxicillin   ?  Other reaction(s): Nausea/Vomiting  ? Chicken Allergy Other (See Comments)  ?  Other reaction(s): Unknown  ? Clavulanic Acid   ?  Other reaction(s): Nausea/Vomiting  ? Pantoprazole Sodium Nausea Only  ?  Gets gassy and starting itching like crazy ?Gets gassy and starting itching like crazy  ? Tizanidine   ?  Other reaction(s): See Comments  ? Valium [Diazepam] Other (See Comments)  ?  HA and Abd pain.  ? Tape Rash and Other (See Comments)  ?  Surgical tape  ? Wound Dressing Adhesive Other (See Comments) and Rash  ?  Surgical tape ?Surgical tape ?Surgical tape  ? ? ?Review of Systems: Negative except as noted in the HPI. ? ?Objective: No changes noted in today's physical examination. ?Objective:  ? ?Vascular Examination: ?CFT <3 seconds b/l LE. Palpable DP pulse(s) b/l LE. Palpable PT pulse(s) b/l LE. Pedal hair absent. No pain with calf compression b/l. Lower extremity skin temperature gradient within normal limits. Trace edema noted BLE. No cyanosis or clubbing noted b/l LE. ? ?Neurological Examination: ?Sensation grossly intact b/l with 10 gram monofilament. Vibratory sensation intact b/l. Protective sensation intact 5/5 intact bilaterally with 10g monofilament b/l. Vibratory sensation intact b/l. ? ?Dermatological Examination: ?Pedal skin with normal turgor, texture and tone b/l. Toenails 1-5  b/l thick, discolored, elongated with subungual debris and pain on dorsal palpation. No hyperkeratotic lesions noted b/l. Pedal skin is warm and supple b/l LE. No open wounds b/l LE. No interdigital macerations noted b/l LE. Toenails 1-5 bilaterally elongated, discolored, dystrophic, thickened, and crumbly with subungual debris and tenderness to dorsal palpation. No hyperkeratotic nor porokeratotic lesions present on today's visit. ? ?Musculoskeletal Examination: ?Muscle strength 5/5 to b/l LE. Muscle strength 5/5 to all lower extremity muscle groups bilaterally. Limited joint ROM to the right ankle. Hammertoe deformity noted 2-5 b/l. ? ?Radiographs: None ? ?Last A1c:   ? ?  Latest Ref Rng & Units 01/24/2022  ?  3:47 AM  ?Hemoglobin A1C  ?Hemoglobin-A1c 4.8 - 5.6 % 5.5    ?  ?Assessment/Plan: ?1. Pain due to onychomycosis of toenail   ?2. Coagulation defect ([place])   ?  ?-Patient was evaluated and treated. All patient's and/or POA's questions/concerns answered on today's visit. ?-Patient to continue soft, supportive shoe gear daily. ?-Toenails 1-5 b/l were debrided in length and girth with sterile nail nippers and dremel without iatrogenic bleeding.  ?-Patient/POA to call should there be question/concern in the interim.  ? ?Return in about 3 months (around 06/29/2022). ? ?Marzetta Board, DPM  ?

## 2022-04-04 ENCOUNTER — Ambulatory Visit (INDEPENDENT_AMBULATORY_CARE_PROVIDER_SITE_OTHER): Payer: Medicare Other

## 2022-04-04 ENCOUNTER — Telehealth: Payer: Self-pay | Admitting: Gastroenterology

## 2022-04-04 DIAGNOSIS — I482 Chronic atrial fibrillation, unspecified: Secondary | ICD-10-CM | POA: Diagnosis not present

## 2022-04-04 DIAGNOSIS — I484 Atypical atrial flutter: Secondary | ICD-10-CM

## 2022-04-04 DIAGNOSIS — Z7901 Long term (current) use of anticoagulants: Secondary | ICD-10-CM

## 2022-04-04 LAB — POCT INR: INR: 1.3 — AB (ref 2.0–3.0)

## 2022-04-04 NOTE — Patient Instructions (Addendum)
Description   ?Take 1 tablet today and 1.5 tomorrow and then continue taking 1/2 tablet daily EXCEPT 1 tablet on Mondays, Wednesdays and Saturdays.  ?Please call our office with any medication changes or concerns ? [phone]  ?Stay consistent - Ensure every other day  ?Return in 1 week for INR check. ?  ?   ?

## 2022-04-04 NOTE — Telephone Encounter (Signed)
Inbound call from patient stating that Aciphex is not working and is seeking advice if there is a alternative he can take. Please advise.  ?

## 2022-04-05 NOTE — Telephone Encounter (Signed)
Left message for pt to call back  °

## 2022-04-06 ENCOUNTER — Telehealth: Payer: Self-pay | Admitting: Orthopaedic Surgery

## 2022-04-06 ENCOUNTER — Other Ambulatory Visit: Payer: Self-pay | Admitting: Orthopaedic Surgery

## 2022-04-06 MED ORDER — TIZANIDINE HCL 2 MG PO TABS: 2.0000 mg | ORAL_TABLET | Freq: Three times a day (TID) | ORAL | 1 refills | Status: DC | PRN

## 2022-04-06 NOTE — Telephone Encounter (Signed)
Please send in a refill of the muscle relaxer, pt couldn't remember the name. ? ?Also he is asking for a letter to be sent to Dr Vincent Peyer  ? ?Please call the pt with questions  ?

## 2022-04-06 NOTE — Telephone Encounter (Signed)
He said the dr would know --he does it for him often? ?

## 2022-04-06 NOTE — Telephone Encounter (Signed)
Pt stated that he was started on RABEprazole about 5 weeks ago. Pt stated that the medication is helping the heartburn and reflex although he feels that the medication is giving him diarrhea: Pt stated that he did not have any diarrhea until starting this medication and it has been consistent since starting: Pt stated that he is having 3 to 4 diarrhea stools a day: ?Pt requesting an alternative: ?Please advise: ?

## 2022-04-11 ENCOUNTER — Ambulatory Visit (INDEPENDENT_AMBULATORY_CARE_PROVIDER_SITE_OTHER): Payer: Medicare Other

## 2022-04-11 DIAGNOSIS — Z7901 Long term (current) use of anticoagulants: Secondary | ICD-10-CM

## 2022-04-11 DIAGNOSIS — I482 Chronic atrial fibrillation, unspecified: Secondary | ICD-10-CM

## 2022-04-11 DIAGNOSIS — I484 Atypical atrial flutter: Secondary | ICD-10-CM | POA: Diagnosis not present

## 2022-04-11 LAB — POCT INR: INR: 2.8 (ref 2.0–3.0)

## 2022-04-11 NOTE — Telephone Encounter (Signed)
Please ask him to stop magnesium ?If he still has diarrhea in 1 week, then changed Aciphex to every other day ?RG ?

## 2022-04-11 NOTE — Patient Instructions (Signed)
Description   ?Continue taking 1/2 tablet daily EXCEPT 1 tablet on Mondays, Wednesdays and Saturdays.  ?Please call our office with any medication changes or concerns ? [phone]  ?Stay consistent - Ensure every other day  ?Return in 3 weeks for INR check. ?  ?   ?

## 2022-04-12 NOTE — Telephone Encounter (Signed)
Pt called back states that he has a 15lb weight loss and no appetite ?

## 2022-04-12 NOTE — Telephone Encounter (Signed)
Left message for pt to call back  °

## 2022-04-12 NOTE — Telephone Encounter (Signed)
Pt was made aware of Dr. Lyndel Safe recommendations: to stop magnesium ?If he still has diarrhea in 1 week, then changed Aciphex to every other day ?Pt stated that he has lost 15 lbs with no appetite: ?Pt scheduled for an office visit with Dr. Lyndel Safe on 04/14/2022 at 9:30 in [place] with Dr. Lyndel Safe: for weight loss, GERD, Diarrhea: Pt made aware: ?Pt verbalized understanding with all questions answered.  ? ? ?

## 2022-04-12 NOTE — Telephone Encounter (Signed)
Patient called today stating that the medication he's taking is not working for him.  He says he can't live like this and wants to know what else he can do.  Please call patient and advise.  Thank you. ?

## 2022-04-14 ENCOUNTER — Encounter: Payer: Self-pay | Admitting: Gastroenterology

## 2022-04-14 ENCOUNTER — Other Ambulatory Visit: Payer: Medicare Other

## 2022-04-14 ENCOUNTER — Ambulatory Visit (INDEPENDENT_AMBULATORY_CARE_PROVIDER_SITE_OTHER): Payer: Medicare Other | Admitting: Gastroenterology

## 2022-04-14 VITALS — BP 110/70 | HR 53 | Ht 69.0 in | Wt 121.0 lb

## 2022-04-14 DIAGNOSIS — M25511 Pain in right shoulder: Secondary | ICD-10-CM | POA: Diagnosis not present

## 2022-04-14 DIAGNOSIS — R197 Diarrhea, unspecified: Secondary | ICD-10-CM | POA: Diagnosis not present

## 2022-04-14 DIAGNOSIS — K7460 Unspecified cirrhosis of liver: Secondary | ICD-10-CM | POA: Diagnosis not present

## 2022-04-14 DIAGNOSIS — D509 Iron deficiency anemia, unspecified: Secondary | ICD-10-CM

## 2022-04-14 DIAGNOSIS — K219 Gastro-esophageal reflux disease without esophagitis: Secondary | ICD-10-CM

## 2022-04-14 DIAGNOSIS — M542 Cervicalgia: Secondary | ICD-10-CM | POA: Diagnosis not present

## 2022-04-14 MED ORDER — FAMOTIDINE 20 MG PO TABS: 20.0000 mg | ORAL_TABLET | Freq: Two times a day (BID) | ORAL | 4 refills | Status: DC

## 2022-04-14 NOTE — Patient Instructions (Signed)
If you are age 75 or older, your body mass index should be between 23-30. Your Body mass index is 17.87 kg/m?[name] If this is out of the aforementioned range listed, please consider follow up with your Primary Care Provider. ? ?If you are age 75 or younger, your body mass index should be between 19-25. Your Body mass index is 17.87 kg/m?[name] If this is out of the aformentioned range listed, please consider follow up with your Primary Care Provider.  ? ?________________________________________________________ ? ?The [facility] providers would like to encourage you to use [facility] to communicate with providers for non-urgent requests or questions.  Due to long hold times on the telephone, sending your provider a message by [facility] may be a faster and more efficient way to get a response.  Please allow 48 business hours for a response.  Please remember that this is for non-urgent requests.  ?_______________________________________________________ ? ?Please go to the lab on the 2nd floor [address] before you leave the office today. Bring stool specimens back to [address] ? ?We have sent the following medications to your pharmacy for you to pick up at your convenience: ?Pepcid ? ?Stop aciphex ?Continue ensure 1 2 times a day ?Stop magnesium ?Take imodium as needed ? ?You have been scheduled for an Upper GI Series and Small Bowel Follow Thru at [facility]. Your appointment is on May 4 at 1030 am. Please arrive 30 minutes prior to your test for registration. Make certain not to have anything to eat or drink after midnight on the night before your test. If you need to reschedule, please contact radiology at [phone]. ?--------------------------------------------------------------------------------------------------------------- ?An upper GI series uses x rays to help diagnose problems of the upper GI tract, which includes the esophagus, stomach, and duodenum. The duodenum is the first part of the small intestine. ?An  upper GI series is conducted by a radiology technologist or a radiologist--a doctor who specializes in x-ray imaging--at a hospital or outpatient center. ?While sitting or standing in front of an x-ray machine, the patient drinks barium liquid, which is often white and has a chalky consistency and taste. The barium liquid coats the lining of the upper GI tract and makes signs of disease show up more clearly on x rays. X-ray video, called fluoroscopy, is used to view the barium liquid moving through the esophagus, stomach, and duodenum. ?Additional x rays and fluoroscopy are performed while the patient lies on an x-ray table. To fully coat the upper GI tract with barium liquid, the technologist or radiologist may press on the abdomen or ask the patient to change position. Patients hold still in various positions, allowing the technologist or radiologist to take x rays of the upper GI tract at different angles. If a technologist conducts the upper GI series, a radiologist will later examine the images to look for problems. ? ?This test typically takes about 1 hour to complete ?--------------------------------------------------------------------------------------------------------------------------------------------- ?The Small Bowel Follow Thru examination is used to visualize the entire small bowel (intestines); specifically the connection between the small and large intestine. You will be positioned on a flat x-ray table and an image of your abdomen taken. Then the technologist will show the x-ray to the radiologist. The radiologist will instruct your technologist how much (1-2 cups) barium sulfate you will drink and when to begin taking the timed x-rays, usually 15-30 minutes after you begin drinking. Barium is a harmless substance that will highlight your small intestine by absorbing x-ray. The taste is chalky and it  feels very heavy both in the cup and in your stomach.  After the first x-ray is taken and shown to  the radiologist, he/she will determine when the next image is to be taken. This is repeated until the barium has reached the end of the small intestine and enters the beginning of the colon (cecum). At such time when the barium spills into the colon, you will be positioned on the x-ray table once again. The radiologist will use a fluoroscopic camera to take some detailed pictures of the connection between your small intestine and colon. The fluoroscope is an x-ray unit that works with a television/computer screen. The radiologist will apply pressure to your abdomen with his/her hand and a lead glove, a plastic paddle, or a paddle with an inflated rubber balloon on the end. This is to spread apart your loops of intestine so he/she can see all areas.  ? ?This test typically takes around 1 hour to complete. ? ? Important   Drink plenty of water (8-10 cups/day) for a few days following the procedure to avoid constipation and blockage. The barium will make your stools white for a few days. ?-------------------------------------------------------------------------------------------------------------------------------------------- ? ? ?Thank you, ? ?Dr. Jackquline Denmark ? ? ? ? ? ?We want to thank you for trusting [facility] [place] with your care. All of our staff and providers value the relationships we have built with our patients, and it is an honor to care for you.  ? ?We are writing to let you know that [facility] [place] will close on May 08, 2022, and we invite you to continue to see Dr. Carmell Austria and Gerrit Heck at the [facility] [place] office location. We are consolidating our serices at these [facility] practices to better provide care. Our office staff will work with you to ensure a seamless transition.  ? ?Gerrit Heck, DO -Dr. Bryan Lemma will be movig to [facility] at [address] [place], [place] [address], effective May 08, 2022.  Contact  [phone] to schedule an appointment with him.  ? ?Carmell Austria, MD- Dr. Lyndel Safe will be movig to [facility] at [address], [place], [place] [address], effective May 08, 2022.  Contact [phone] to schedule an appointment with him.  ? ?Requesting Medical Records ?If you need to request your medical records, please follow the instructions below. Your medical records are confidential, and a copy can be transferred to another provider or released to you or another person you designate only with your permission. ? ?There are several ways to request your medical records: ?Requests for medical records can be submitted through our practice.   ?You can also request your records electronically, in your MyChart account by selecting the ?Request Health Records? tab.  ?If you need additional information on how to request records, please go to [email or web address]/, choose Patient Information, then select Request Medical Records. ?To make an appointment or if you have any questions about your health care needs, please contact our office at [phone] and one of our staff members will be glad to assist you. ?[facility] is committed to providing exceptional care for you and our community. Thank you for allowing [place] to serve your health care needs. ?Sincerely, ? ?Windy Canny, Director [facility] ?[facility] also offers convenient virtual care options. Sore throat? Sinus problems? Cold or flu symptoms? Get care from the comfort of home with [facility] Video Visits and e-Visits. Learn more about the non-emergency conditions treated and start  your virtual visit at [email or web address] ? ?

## 2022-04-14 NOTE — Progress Notes (Signed)
? ? ?Chief Complaint: GERD ? ?Referring Provider:  [name], PA-C    ? ? ?ASSESSMENT AND PLAN;  ? ?#1. IDA with H+ stools (resolved) on Coumadin.  Last Hb 02/02/2022 12.9, Nl MCV. Not a candidate for elective endoscopic procedures per cardiology/also frail pulmonary status. Neg NCCT AP 01/2022 ? ?#2. GERD with N/V (s/p EGD 06/2017 - at [facility], [place] -report reviewed, mild gastritis with neg Bx. No varices). Had "allergic reaction" to protonix.  ? ?#2.  Liver Cirrhosis (likely cardiac liver cirrhosis with congestive hepatomegaly). No ETOH. No portal hypertension on CT 01/2022.  Prev mild ascites likely d/t right heart failure with cor pulmonale and pulmonary HTN. Neg [place] with doppler 04/2018. Nl AFP 3.2 04/2019. Alb 4.1 05/2019. [place] 06/06/2019-congestive hepatomegaly, prominent hepatic veins and IVC d/t elevated RH pressures. ? ?#3. Diarrhea likely d/t magnesium supplements.  Neg hemoccult. Neg colon in [place] ? ?#4  Anemia of chronic disease.  No overt GI bleeding. ? ?#5. Co-morbid conditions include COPD, RV failure with pulmonary HTN/cor pulmonale, Afib with Nl EF 55% (07/2019), CAD s/p CABG, mechanical AVR on Coumadin, chronic respiratory failure, HLD, HTN.  Leuko-classic vasculitis. ? ? ?Plan: ? ?-UGI with SB series (give Ba tab as well d/t recent dysphagia) ?-Stool studies for GI Pathogen and Calprotectin. ?-Continue ensure 1 bid ?-Stop aciphex ?-Pepcid 25m po BID #180, 3RF ?-Stop magenisum ?-Can take imodium as needed ?-No egd/colon unless active GI bleed or abn UGI and then also, in hosiptal setting ONLY.  Very frail cardiopulmonary status. ? ? ? ? ? ?HPI:   ? ?Lance BUICEis a 76y.o. male  ?Retd pEngineer, structuralwith AF on warfarin, CAD and aortic stenosis s/p CABG, mech AVR in 2010 (on coumadin), [facility] [place] [facility] [place] [place] [place] Also has CHF, advanced COPD (quit smoking in 2002 - 1 ppd x 25 years), HLD, CKD3, and HTN. ? ?For FU visit ? ?Very complicated. Not a  candidate for elective endoscopic procedures per cardiology (Dr MBettina Gavia ? ?C/O N/V, every morning, only phelem, does not throw up food. No Appetitie, tolerating ensure BID. Carries "rag with him all the time". lately dysphagia x 2 weeks. Constantly spit phelem, attributed to advanced COPD and chronic bronchitis.  Has been having problems with Aciphex as well in form of dizziness.  Similar to what he had with other PPIs. ? ?Longstanding diarrhea x after eating  ?Small amounts but 6-8 times per day.  Worst has been 10 times per day. ?No nocturnal symptoms. ?No blood  ?Has been taking magnesium supplements on his own. ?Used to be more constipated. ?Neg noncontrast CT AP February 2023 ? ?No abdo pain  ? ?No weight loss ? ?Wt Readings from Last 3 Encounters:  ?04/14/22 121 lb (54.9 kg)  ?03/23/22 118 lb 6.4 oz (53.7 kg)  ?02/23/22 114 lb 12.8 oz (52.1 kg)  ? ? ?Multiple skin bruises d/t coumadin ? ? ? ?From previous notes: ?-Did not tolerate omeprazole or Protonix.  Previously, has been doing well on AcipHex 20 mg p.o. once a day. ?-Had right upper quadrant abdominal pain attributed to congestive hepatomegaly. LFTs showed some improvement as below. [place]-showed prominent hepatic veins and IVC likely due to elevated right heart pressures.  No ascites or any other acute abnormalities.  Doppler showed normal direction of blood flow towards the liver. His diuretics were increased.  This is resulted in improvement of the right sided abdominal pain.  He has lost approximately 6 pounds  over last 1 week. ?-negative colonoscopy in 2015 in [place].  He had to be on Lovenox bridging prior to colonoscopy which he did not like.   ? ? ?Past GI procedures : ? ?CT AP without contrast 01/2022 ?-Negative for any acute abnormalities ?-Nonobstructing renal stones ?-Colonic diverticulosis ?-Right middle lobe lung nodule ?-Liver looked normal.  No portal hypertension. ? ?CT AP 01/2021 without contrast ?1. No acute abnormality in the  abdomen/pelvis. ?2. Colonic diverticulosis without diverticulitis. ?3. Ground-glass opacities in the lingula and to a lesser extent ?lower lobes consistent with COVID pneumonia. ?4. Right middle lobe pulmonary nodule measuring 9 mm demonstrates 21 ?month stability, near certainly benign. ? ?EGD 06/2011 as above.   ? ?Colonoscopy 2015: neg. Had small polyp in 2012. [place] ?H/O acute gallstone pancreatitis 07/2017.  Underwent lap chole on 07/29/2017.  Postoperatively had intra-abdominal bleed, taken back for open laparotomy.  Had bleeding from unclear omental source.  Had 8U PRBC. ? ?Past Medical History:  ?Diagnosis Date  ? Allergic rhinitis 04/28/2016  ? Last Assessment & Plan:  Continue astelin  ? Anemia 07/01/2019  ? 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 Last Assessment & Plan:  Stable in size on annual imaging. In light of concurrent left nephrolithiasis, will check CT renal colic next year instead of renal US.   ? Anticoagulated on Coumadin 01/04/2018  ? Anxiety 05/10/2016  ? Last Assessment & Plan:  Doing well off of zoloft.  ? Asthma   ? Asymptomatic microscopic hematuria 06/20/2017  ? Last Assessment & Plan:  Had hematuria workup in [facility] in 2016 which negative CT and cystoscopy. UA with 2+ blood last visit - we discussed recommendation for repeat workup at 5 years or if degree of hematuria progresses.   ? Atrial fibrillation ([place]) 03/29/2017  ? Atrial flutter ([place]) 03/29/2017  ? Backache 12/14/2015  ? Last Assessment & Plan:  Pain management referral for further evaluation.  ? Bilateral pleural effusion 08/09/2017  ? CHF (congestive heart failure) ([place])   ? Chronic allergic rhinitis 04/28/2016  ? Last Assessment & Plan:  Continue astelin  ? Chronic anticoagulation 03/29/2017  ? Chronic atrial fibrillation ([place]) 07/23/2015  ? Last Assessment & Plan:  Coumadin and metoprolol, cardiology referral to establish care.  ? Chronic midline back pain 12/14/2015  ? Last Assessment & Plan:  Pain management referral for further evaluation.   ? Chronic obstructive lung disease ([place]) 06/12/2016  ? With hypoxia  ? Chronic prostatitis 07/23/2015  ? Last Assessment & Plan:  Has largely resolved since stopping bike riding. Recommend annual DRE AND PSA - will see back 12/2015 for annual screening, given 1st degree fhx. To call office for recurrent prostatitis symptoms.   ? Chronic respiratory failure with hypoxia ([place]) 07/29/2019  ? Cirrhosis of liver not due to alcohol ([place]) 07/01/2019  ? COPD (chronic obstructive pulmonary disease) ([place]) 06/12/2016  ? Coronary arteriosclerosis in native artery 03/29/2017  ? Coronary artery disease involving native coronary artery of native heart with angina pectoris ([place]) 03/29/2017  ? Cough 10/17/2016  ? Last Assessment & Plan:  Discussed typical course for acute viral illness. If symptoms worsen or fail to improve by 7-10d, delayed ATBs, fluids, rest, NSAIDs/APAP prn. Seek care if not improving. Needs earlier INR check due to ATBs.  ? Dyspnea 02/01/2016  ? Last Assessment & Plan:  Overall improving, eval by pulm, plan for CT, neg stress test with cardiology. Recent switch to carvedilol due to side effects.  ? Encounter for therapeutic drug monitoring  01/06/2019  ? Enterococcal bacteremia   ? Epidermoid cyst of skin 08/24/2017  ? Essential hypertension 12/14/2015  ? Last Assessment & Plan:  Hypertension control: controlled  Medications: compliant Medication Management: as noted in orders Home blood pressure monitoring recommended additionally as needed for symptoms  The patient's care plan was reviewed and updated. Instructions and counseling were provided regarding patient goals and barriers. He was counseled to adopt a healthy lifestyle. Educational resources and self-management tools have been provided as charted in [facility] list.   ? H/O maze procedure 03/29/2017  ? [name] 03/29/2017  ? Overview:  2011  ? History of coronary artery bypass graft 03/29/2017  ? Hx of CABG 03/29/2017  ? Hyperlipidemia 03/29/2017  ?  Hypersensitivity angiitis ([place]) 10/01/2017  ? Hypertensive heart disease 03/29/2017  ? Hypertensive heart disease with heart failure ([place]) 03/29/2017  ? Hypertensive heart failure ([place]) 03/29/2017  ? Hypokalemia due to excessive

## 2022-04-18 ENCOUNTER — Other Ambulatory Visit: Payer: Self-pay | Admitting: Emergency Medicine

## 2022-04-19 ENCOUNTER — Other Ambulatory Visit: Payer: Medicare Other

## 2022-04-19 DIAGNOSIS — D509 Iron deficiency anemia, unspecified: Secondary | ICD-10-CM

## 2022-04-19 DIAGNOSIS — R197 Diarrhea, unspecified: Secondary | ICD-10-CM

## 2022-04-19 DIAGNOSIS — K7460 Unspecified cirrhosis of liver: Secondary | ICD-10-CM | POA: Diagnosis not present

## 2022-04-19 DIAGNOSIS — K219 Gastro-esophageal reflux disease without esophagitis: Secondary | ICD-10-CM | POA: Diagnosis not present

## 2022-04-23 ENCOUNTER — Other Ambulatory Visit: Payer: Self-pay | Admitting: Medical

## 2022-04-23 LAB — GI PROFILE, STOOL, PCR

## 2022-04-24 LAB — CALPROTECTIN, FECAL: Calprotectin, Fecal: 86 ug/g (ref 0–120)

## 2022-04-27 ENCOUNTER — Ambulatory Visit (HOSPITAL_COMMUNITY): Payer: Medicare Other

## 2022-05-03 ENCOUNTER — Ambulatory Visit (HOSPITAL_COMMUNITY)
Admission: RE | Admit: 2022-05-03 | Discharge: 2022-05-03 | Disposition: A | Discharge status: Home / Self Care | Payer: Medicare Other | Source: Ambulatory Visit | Attending: Gastroenterology | Admitting: Gastroenterology

## 2022-05-03 DIAGNOSIS — D509 Iron deficiency anemia, unspecified: Secondary | ICD-10-CM | POA: Diagnosis not present

## 2022-05-03 DIAGNOSIS — K7460 Unspecified cirrhosis of liver: Secondary | ICD-10-CM | POA: Diagnosis not present

## 2022-05-03 DIAGNOSIS — K219 Gastro-esophageal reflux disease without esophagitis: Secondary | ICD-10-CM | POA: Insufficient documentation

## 2022-05-03 DIAGNOSIS — R1310 Dysphagia, unspecified: Secondary | ICD-10-CM | POA: Diagnosis not present

## 2022-05-03 DIAGNOSIS — R197 Diarrhea, unspecified: Secondary | ICD-10-CM | POA: Insufficient documentation

## 2022-05-08 ENCOUNTER — Telehealth: Payer: Self-pay

## 2022-05-08 NOTE — Telephone Encounter (Signed)
Patient said he stopped the aciphex and only been on pepcid and he has been having problems with his GERD and would like to know about a solution to this. Please advise?  ? ?He is aware of Upper GI series and Stool specimens as well ?

## 2022-05-09 MED ORDER — DEXLANSOPRAZOLE 60 MG PO CPDR: 60.0000 mg | DELAYED_RELEASE_CAPSULE | Freq: Every day | ORAL | 3 refills | Status: DC

## 2022-05-09 NOTE — Telephone Encounter (Signed)
Medication has been sent.  

## 2022-05-09 NOTE — Telephone Encounter (Signed)
LVM for patient to call back. ?

## 2022-05-09 NOTE — Telephone Encounter (Signed)
We received a call from patient wanting to follow up with [place] regarding his GERD. Please advise. ?

## 2022-05-09 NOTE — Telephone Encounter (Signed)
Lets arrange for Dexilant 60 mg PO QD ?Continue Pepcid for now ?I have reviewed his upper GI series- neg for HH ?If unable to get Dexilant, then GI cocktail 15 cc po Q8hrs prn (in addition to Pepcid twice daily) ? ?RG ?

## 2022-05-10 ENCOUNTER — Telehealth: Payer: Self-pay | Admitting: Medical

## 2022-05-10 NOTE — Telephone Encounter (Signed)
Patient made aware.

## 2022-05-10 NOTE — Telephone Encounter (Signed)
Left message for patient to call back and schedule Medicare Annual Wellness Visit (AWV).  ? ?Please offer to do virtually or by telephone.  Left office number and my jabber [phone]. ? ?Last AWV:06/17/2020 ? ?Please schedule at anytime with Nurse Health Advisor. ?  ?

## 2022-05-11 ENCOUNTER — Other Ambulatory Visit: Payer: Self-pay

## 2022-05-11 DIAGNOSIS — K219 Gastro-esophageal reflux disease without esophagitis: Secondary | ICD-10-CM

## 2022-05-11 MED ORDER — FAMOTIDINE 40 MG PO TABS: 40.0000 mg | ORAL_TABLET | Freq: Two times a day (BID) | ORAL | 4 refills | Status: DC

## 2022-05-16 ENCOUNTER — Ambulatory Visit (INDEPENDENT_AMBULATORY_CARE_PROVIDER_SITE_OTHER): Payer: Medicare Other | Admitting: Medical

## 2022-05-16 ENCOUNTER — Telehealth: Payer: Self-pay

## 2022-05-16 ENCOUNTER — Ambulatory Visit (HOSPITAL_BASED_OUTPATIENT_CLINIC_OR_DEPARTMENT_OTHER)
Admission: RE | Admit: 2022-05-16 | Discharge: 2022-05-16 | Disposition: A | Discharge status: Home / Self Care | Payer: Medicare Other | Source: Ambulatory Visit | Attending: Medical | Admitting: Medical

## 2022-05-16 VITALS — BP 94/70 | HR 77 | Temp 98.2°F | Resp 18 | Ht 69.0 in | Wt 108.0 lb

## 2022-05-16 DIAGNOSIS — R531 Weakness: Secondary | ICD-10-CM | POA: Diagnosis not present

## 2022-05-16 DIAGNOSIS — E611 Iron deficiency: Secondary | ICD-10-CM | POA: Diagnosis not present

## 2022-05-16 DIAGNOSIS — Z66 Do not resuscitate: Secondary | ICD-10-CM | POA: Diagnosis not present

## 2022-05-16 DIAGNOSIS — Z7401 Bed confinement status: Secondary | ICD-10-CM | POA: Diagnosis not present

## 2022-05-16 DIAGNOSIS — I509 Heart failure, unspecified: Secondary | ICD-10-CM

## 2022-05-16 DIAGNOSIS — Z952 Presence of prosthetic heart valve: Secondary | ICD-10-CM | POA: Diagnosis not present

## 2022-05-16 DIAGNOSIS — K922 Gastrointestinal hemorrhage, unspecified: Secondary | ICD-10-CM | POA: Diagnosis not present

## 2022-05-16 DIAGNOSIS — Z681 Body mass index (BMI) 19 or less, adult: Secondary | ICD-10-CM | POA: Diagnosis not present

## 2022-05-16 DIAGNOSIS — R5381 Other malaise: Secondary | ICD-10-CM | POA: Diagnosis not present

## 2022-05-16 DIAGNOSIS — D62 Acute posthemorrhagic anemia: Secondary | ICD-10-CM | POA: Diagnosis not present

## 2022-05-16 DIAGNOSIS — I2721 Secondary pulmonary arterial hypertension: Secondary | ICD-10-CM | POA: Diagnosis not present

## 2022-05-16 DIAGNOSIS — K7469 Other cirrhosis of liver: Secondary | ICD-10-CM | POA: Diagnosis not present

## 2022-05-16 DIAGNOSIS — N1831 Chronic kidney disease, stage 3a: Secondary | ICD-10-CM | POA: Diagnosis not present

## 2022-05-16 DIAGNOSIS — Z7901 Long term (current) use of anticoagulants: Secondary | ICD-10-CM

## 2022-05-16 DIAGNOSIS — K7460 Unspecified cirrhosis of liver: Secondary | ICD-10-CM | POA: Diagnosis present

## 2022-05-16 DIAGNOSIS — F05 Delirium due to known physiological condition: Secondary | ICD-10-CM | POA: Diagnosis not present

## 2022-05-16 DIAGNOSIS — D649 Anemia, unspecified: Secondary | ICD-10-CM

## 2022-05-16 DIAGNOSIS — E871 Hypo-osmolality and hyponatremia: Secondary | ICD-10-CM | POA: Diagnosis not present

## 2022-05-16 DIAGNOSIS — R627 Adult failure to thrive: Secondary | ICD-10-CM

## 2022-05-16 DIAGNOSIS — I5042 Chronic combined systolic (congestive) and diastolic (congestive) heart failure: Secondary | ICD-10-CM | POA: Diagnosis not present

## 2022-05-16 DIAGNOSIS — K2971 Gastritis, unspecified, with bleeding: Secondary | ICD-10-CM | POA: Diagnosis not present

## 2022-05-16 DIAGNOSIS — D500 Iron deficiency anemia secondary to blood loss (chronic): Secondary | ICD-10-CM | POA: Diagnosis not present

## 2022-05-16 DIAGNOSIS — K7030 Alcoholic cirrhosis of liver without ascites: Secondary | ICD-10-CM | POA: Diagnosis not present

## 2022-05-16 DIAGNOSIS — N170 Acute kidney failure with tubular necrosis: Secondary | ICD-10-CM | POA: Diagnosis not present

## 2022-05-16 DIAGNOSIS — M2550 Pain in unspecified joint: Secondary | ICD-10-CM | POA: Diagnosis not present

## 2022-05-16 DIAGNOSIS — Z7189 Other specified counseling: Secondary | ICD-10-CM | POA: Diagnosis not present

## 2022-05-16 DIAGNOSIS — K5900 Constipation, unspecified: Secondary | ICD-10-CM | POA: Diagnosis not present

## 2022-05-16 DIAGNOSIS — E43 Unspecified severe protein-calorie malnutrition: Secondary | ICD-10-CM

## 2022-05-16 DIAGNOSIS — K2960 Other gastritis without bleeding: Secondary | ICD-10-CM | POA: Diagnosis not present

## 2022-05-16 DIAGNOSIS — G9341 Metabolic encephalopathy: Secondary | ICD-10-CM | POA: Diagnosis not present

## 2022-05-16 DIAGNOSIS — N179 Acute kidney failure, unspecified: Secondary | ICD-10-CM | POA: Diagnosis not present

## 2022-05-16 DIAGNOSIS — Z515 Encounter for palliative care: Secondary | ICD-10-CM | POA: Diagnosis not present

## 2022-05-16 DIAGNOSIS — E860 Dehydration: Secondary | ICD-10-CM | POA: Diagnosis not present

## 2022-05-16 DIAGNOSIS — D638 Anemia in other chronic diseases classified elsewhere: Secondary | ICD-10-CM | POA: Diagnosis not present

## 2022-05-16 DIAGNOSIS — E538 Deficiency of other specified B group vitamins: Secondary | ICD-10-CM

## 2022-05-16 DIAGNOSIS — Z8616 Personal history of COVID-19: Secondary | ICD-10-CM | POA: Diagnosis not present

## 2022-05-16 DIAGNOSIS — I272 Pulmonary hypertension, unspecified: Secondary | ICD-10-CM | POA: Diagnosis not present

## 2022-05-16 DIAGNOSIS — R1310 Dysphagia, unspecified: Secondary | ICD-10-CM | POA: Diagnosis not present

## 2022-05-16 DIAGNOSIS — I25119 Atherosclerotic heart disease of native coronary artery with unspecified angina pectoris: Secondary | ICD-10-CM | POA: Diagnosis not present

## 2022-05-16 DIAGNOSIS — R109 Unspecified abdominal pain: Secondary | ICD-10-CM | POA: Diagnosis not present

## 2022-05-16 DIAGNOSIS — I482 Chronic atrial fibrillation, unspecified: Secondary | ICD-10-CM | POA: Diagnosis not present

## 2022-05-16 DIAGNOSIS — J9601 Acute respiratory failure with hypoxia: Secondary | ICD-10-CM | POA: Diagnosis not present

## 2022-05-16 DIAGNOSIS — J690 Pneumonitis due to inhalation of food and vomit: Secondary | ICD-10-CM | POA: Diagnosis not present

## 2022-05-16 DIAGNOSIS — J449 Chronic obstructive pulmonary disease, unspecified: Secondary | ICD-10-CM | POA: Diagnosis not present

## 2022-05-16 DIAGNOSIS — R195 Other fecal abnormalities: Secondary | ICD-10-CM | POA: Diagnosis not present

## 2022-05-16 DIAGNOSIS — D696 Thrombocytopenia, unspecified: Secondary | ICD-10-CM | POA: Diagnosis not present

## 2022-05-16 DIAGNOSIS — D509 Iron deficiency anemia, unspecified: Secondary | ICD-10-CM | POA: Diagnosis present

## 2022-05-16 DIAGNOSIS — I130 Hypertensive heart and chronic kidney disease with heart failure and stage 1 through stage 4 chronic kidney disease, or unspecified chronic kidney disease: Secondary | ICD-10-CM | POA: Diagnosis present

## 2022-05-16 DIAGNOSIS — I2510 Atherosclerotic heart disease of native coronary artery without angina pectoris: Secondary | ICD-10-CM | POA: Diagnosis not present

## 2022-05-16 DIAGNOSIS — I472 Ventricular tachycardia, unspecified: Secondary | ICD-10-CM | POA: Diagnosis not present

## 2022-05-16 DIAGNOSIS — D631 Anemia in chronic kidney disease: Secondary | ICD-10-CM | POA: Diagnosis present

## 2022-05-16 DIAGNOSIS — I9589 Other hypotension: Secondary | ICD-10-CM | POA: Diagnosis not present

## 2022-05-16 DIAGNOSIS — Z8701 Personal history of pneumonia (recurrent): Secondary | ICD-10-CM | POA: Diagnosis not present

## 2022-05-16 NOTE — Telephone Encounter (Signed)
Lance Hicks , patient's friend     [phone]

## 2022-05-16 NOTE — Progress Notes (Addendum)
Subjective:    Patient ID: Lance Hicks, male    DOB: Apr 22, 1946, 76 y.o.   MRN: [id number]  HPI Pt in for follow up.   Pt states he feels tired and has no appetite.  Last time I saw pt was on 02-23-2022. That was follow up from hospitalization.  Pt has history of anemia,  atrial fibriallation, chf and cirrhosis.  He states not sleeping well recently.   He states no appetite.  Pt wt 04-14-2022 was 121 lb. Today weight is 108 lbs.  Some recent brusing. He is on coumadin. Last inr was 2.8.   A/P visit summary with Dr. Georgeanna Hicks 04-14-2022.  #1. IDA with H+ stools (resolved) on Coumadin.  Last Hb 02/02/2022 12.9, Nl MCV. Not a candidate for elective endoscopic procedures per cardiology/also frail pulmonary status. Neg NCCT AP 01/2022   #2. GERD with N/V (s/p EGD 06/2017 - at [facility], [place] -report reviewed, mild gastritis with neg Bx. No varices). Had "allergic reaction" to protonix.    #2.  Liver Cirrhosis (likely cardiac liver cirrhosis with congestive hepatomegaly). No ETOH. No portal hypertension on CT 01/2022.  Prev mild ascites likely d/t right heart failure with cor pulmonale and pulmonary HTN. Neg [place] with doppler 04/2018. Nl AFP 3.2 04/2019. Alb 4.1 05/2019. [place] 06/06/2019-congestive hepatomegaly, prominent hepatic veins and IVC d/t elevated RH pressures.   #3. Diarrhea likely d/t magnesium supplements.  Neg hemoccult. Neg colon in [place]   #4  Anemia of chronic disease.  No overt GI bleeding.   #5. Co-morbid conditions include COPD, RV failure with pulmonary HTN/cor pulmonale, Afib with Nl EF 55% (07/2019), CAD s/p CABG, mechanical AVR on Coumadin, chronic respiratory failure, HLD, HTN.  Leuko-classic vasculitis.     Plan:   -UGI with SB series (give Ba tab as well d/t recent dysphagia) -Stool studies for GI Pathogen and Calprotectin. -Continue ensure 1 bid -Stop aciphex -Pepcid 22m po BID #180, 3RF -Stop magenisum -Can take imodium as needed -No egd/colon unless active  GI bleed or abn UGI and then also, in hosiptal setting ONLY.  Very frail cardiopulmonary status.    Review of Systems  Constitutional:  Positive for fatigue. Negative for chills and fever.       No appetite.  HENT:  Negative for dental problem.   Respiratory:  Negative for cough, chest tightness, shortness of breath and wheezing.   Cardiovascular:  Negative for chest pain and palpitations.  Gastrointestinal:  Negative for abdominal pain, diarrhea, nausea and vomiting.  Genitourinary:  Negative for dysuria and frequency.  Musculoskeletal:  Negative for back pain, myalgias and neck stiffness.  Skin:  Negative for rash.  Neurological:  Negative for dizziness, seizures, syncope, weakness, numbness and headaches.  Hematological:  Negative for adenopathy. Does not bruise/bleed easily.  Psychiatric/Behavioral:  Positive for sleep disturbance.       Objective:   Physical Exam   General Alert and oriented.-Cachectic look.  Skin Scattered bruising to upper chest.  Neck Carotid Arteries- Normal color. Moisture- Normal Moisture. No carotid bruits. No JVD.  Chest and Lung Exam Auscultation: Breath Sounds:-Normal.  Cardiovascular Auscultation:Rythm- Regular. Murmurs & Other Heart Sounds:Auscultation of the heart reveals- No Murmurs.  Abdomen Inspection:-Inspeection Normal. Palpation/Percussion:Note:No mass. Palpation and Percussion of the abdomen reveal- Non Tender, Non Distended + BS, no rebound or guarding.    Neurologic Cranial Nerve exam:- CN III-XII intact(No nystagmus), symmetric smile. Finger to Nose:- Normal/Intact Strength:- 5/5 equal and symmetric strength both upper and lower extremities.  Lower extremity-1+ pedal edema symmetric bilaterally.    Assessment & Plan:   Overall reporting severe fatigue, poor appetite and obvious weight loss.  Since April 14, 2022 has lost 13 pounds.  Concern for malnutrition and failure to thrive.  History of anemia, CHF, atrial  fibrillation ,cirrhosis , anemia and decreased GFR.  Considered emergency department evaluation today versus getting labs stat and getting chest x-ray.  Discussed today that if labs show any emergent values or significant borderline values then would recommend emergency department evaluation tonight.  Even if no emergency values but if clinically not doing well over the next few days then would recommend reevaluation.  Labs to include CBC, CMP, BNP, iron and INR.  Also today included magnesium level.  Chest x-ray today.  Follow-up next Tuesday or sooner if needed.  Informed that my schedule is full the rest of the week and I will be in the office this Friday or Monday.  Lance Pai, PA-C    Time spent with patient today was  45 minutes which consisted of chart review, discussing diagnosis, work up ,treatment and documentation.   Lance Hicks , patient's friend         [phone]

## 2022-05-16 NOTE — Patient Instructions (Addendum)
Overall reporting severe fatigue, poor appetite and obvious weight loss.  Since April 14, 2022 has lost 13 pounds.  Concern for malnutrition and failure to thrive.  History of anemia, CHF, atrial fibrillation ,cirrhosis , anemia and decreased GFR.  Considered emergency department evaluation today versus getting labs stat and getting chest x-ray.  Discussed today that if labs show any emergent values or significant borderline values then would recommend emergency department evaluation tonight.  Even if no emergency values but if clinically not doing well over the next few days then would recommend reevaluation.  Labs to include CBC, CMP, BNP, iron and INR.  Also today included magnesium level.  Chest x-ray today.  Follow-up next Tuesday or sooner if needed.  Informed that my schedule is full the rest of the week and I will be in the office this Friday or Monday.  For insomnia considered benzo but prior reaction to valium. Considered trazadone but on tramadol. Concern that in pt frail state if give something too sedating may fall. Consider low dose hydroxyzine 10 mg but want to review labs first.   [name] , patient's friend. This morning 05-17-2022 MA Hannah notified pt friend of lab results and advise to go to ED for evaluation/tx likley iv fluids.          [phone]

## 2022-05-17 ENCOUNTER — Encounter (HOSPITAL_COMMUNITY): Payer: Self-pay | Admitting: Internal Medicine

## 2022-05-17 ENCOUNTER — Telehealth: Payer: Self-pay

## 2022-05-17 ENCOUNTER — Inpatient Hospital Stay (HOSPITAL_COMMUNITY)
Admission: AD | Admit: 2022-05-17 | Discharge: 2022-05-30 | DRG: 177 | Disposition: A | Discharge status: Skilled Nursing Facility | Payer: Medicare Other | Source: Other Acute Inpatient Hospital | Attending: Internal Medicine | Admitting: Internal Medicine

## 2022-05-17 ENCOUNTER — Inpatient Hospital Stay
Admission: AD | Admit: 2022-05-17 | Payer: Medicare Other | Source: Other Acute Inpatient Hospital | Admitting: Internal Medicine

## 2022-05-17 ENCOUNTER — Other Ambulatory Visit: Payer: Self-pay

## 2022-05-17 DIAGNOSIS — J9601 Acute respiratory failure with hypoxia: Secondary | ICD-10-CM | POA: Diagnosis present

## 2022-05-17 DIAGNOSIS — I472 Ventricular tachycardia, unspecified: Secondary | ICD-10-CM | POA: Diagnosis present

## 2022-05-17 DIAGNOSIS — D631 Anemia in chronic kidney disease: Secondary | ICD-10-CM | POA: Diagnosis present

## 2022-05-17 DIAGNOSIS — K7460 Unspecified cirrhosis of liver: Secondary | ICD-10-CM | POA: Diagnosis present

## 2022-05-17 DIAGNOSIS — J690 Pneumonitis due to inhalation of food and vomit: Principal | ICD-10-CM | POA: Diagnosis present

## 2022-05-17 DIAGNOSIS — K2971 Gastritis, unspecified, with bleeding: Secondary | ICD-10-CM | POA: Diagnosis present

## 2022-05-17 DIAGNOSIS — R1310 Dysphagia, unspecified: Secondary | ICD-10-CM | POA: Diagnosis not present

## 2022-05-17 DIAGNOSIS — R680 Hypothermia, not associated with low environmental temperature: Secondary | ICD-10-CM | POA: Diagnosis present

## 2022-05-17 DIAGNOSIS — D649 Anemia, unspecified: Secondary | ICD-10-CM | POA: Diagnosis not present

## 2022-05-17 DIAGNOSIS — N1831 Chronic kidney disease, stage 3a: Secondary | ICD-10-CM | POA: Diagnosis present

## 2022-05-17 DIAGNOSIS — I130 Hypertensive heart and chronic kidney disease with heart failure and stage 1 through stage 4 chronic kidney disease, or unspecified chronic kidney disease: Secondary | ICD-10-CM | POA: Diagnosis present

## 2022-05-17 DIAGNOSIS — R109 Unspecified abdominal pain: Secondary | ICD-10-CM | POA: Diagnosis not present

## 2022-05-17 DIAGNOSIS — F419 Anxiety disorder, unspecified: Secondary | ICD-10-CM | POA: Diagnosis present

## 2022-05-17 DIAGNOSIS — Z66 Do not resuscitate: Secondary | ICD-10-CM | POA: Diagnosis present

## 2022-05-17 DIAGNOSIS — E7849 Other hyperlipidemia: Secondary | ICD-10-CM | POA: Diagnosis present

## 2022-05-17 DIAGNOSIS — Z8616 Personal history of COVID-19: Secondary | ICD-10-CM

## 2022-05-17 DIAGNOSIS — Z7984 Long term (current) use of oral hypoglycemic drugs: Secondary | ICD-10-CM

## 2022-05-17 DIAGNOSIS — D638 Anemia in other chronic diseases classified elsewhere: Secondary | ICD-10-CM | POA: Diagnosis not present

## 2022-05-17 DIAGNOSIS — Z515 Encounter for palliative care: Secondary | ICD-10-CM | POA: Diagnosis not present

## 2022-05-17 DIAGNOSIS — I482 Chronic atrial fibrillation, unspecified: Secondary | ICD-10-CM | POA: Diagnosis present

## 2022-05-17 DIAGNOSIS — I25119 Atherosclerotic heart disease of native coronary artery with unspecified angina pectoris: Secondary | ICD-10-CM | POA: Diagnosis not present

## 2022-05-17 DIAGNOSIS — Z8701 Personal history of pneumonia (recurrent): Secondary | ICD-10-CM | POA: Diagnosis not present

## 2022-05-17 DIAGNOSIS — M2550 Pain in unspecified joint: Secondary | ICD-10-CM | POA: Diagnosis not present

## 2022-05-17 DIAGNOSIS — R195 Other fecal abnormalities: Secondary | ICD-10-CM | POA: Diagnosis not present

## 2022-05-17 DIAGNOSIS — I272 Pulmonary hypertension, unspecified: Secondary | ICD-10-CM | POA: Diagnosis not present

## 2022-05-17 DIAGNOSIS — Z7901 Long term (current) use of anticoagulants: Secondary | ICD-10-CM

## 2022-05-17 DIAGNOSIS — E860 Dehydration: Secondary | ICD-10-CM | POA: Diagnosis not present

## 2022-05-17 DIAGNOSIS — Z91014 Allergy to mammalian meats: Secondary | ICD-10-CM

## 2022-05-17 DIAGNOSIS — D696 Thrombocytopenia, unspecified: Secondary | ICD-10-CM | POA: Diagnosis present

## 2022-05-17 DIAGNOSIS — Z951 Presence of aortocoronary bypass graft: Secondary | ICD-10-CM

## 2022-05-17 DIAGNOSIS — Z952 Presence of prosthetic heart valve: Secondary | ICD-10-CM | POA: Diagnosis not present

## 2022-05-17 DIAGNOSIS — K7030 Alcoholic cirrhosis of liver without ascites: Secondary | ICD-10-CM | POA: Diagnosis not present

## 2022-05-17 DIAGNOSIS — E871 Hypo-osmolality and hyponatremia: Secondary | ICD-10-CM | POA: Diagnosis present

## 2022-05-17 DIAGNOSIS — Z888 Allergy status to other drugs, medicaments and biological substances status: Secondary | ICD-10-CM

## 2022-05-17 DIAGNOSIS — K922 Gastrointestinal hemorrhage, unspecified: Secondary | ICD-10-CM | POA: Diagnosis not present

## 2022-05-17 DIAGNOSIS — R1313 Dysphagia, pharyngeal phase: Secondary | ICD-10-CM | POA: Diagnosis present

## 2022-05-17 DIAGNOSIS — F05 Delirium due to known physiological condition: Secondary | ICD-10-CM | POA: Diagnosis not present

## 2022-05-17 DIAGNOSIS — R531 Weakness: Secondary | ICD-10-CM | POA: Diagnosis not present

## 2022-05-17 DIAGNOSIS — K219 Gastro-esophageal reflux disease without esophagitis: Secondary | ICD-10-CM | POA: Diagnosis present

## 2022-05-17 DIAGNOSIS — Z87891 Personal history of nicotine dependence: Secondary | ICD-10-CM

## 2022-05-17 DIAGNOSIS — J449 Chronic obstructive pulmonary disease, unspecified: Secondary | ICD-10-CM | POA: Diagnosis present

## 2022-05-17 DIAGNOSIS — Z79899 Other long term (current) drug therapy: Secondary | ICD-10-CM

## 2022-05-17 DIAGNOSIS — I5042 Chronic combined systolic (congestive) and diastolic (congestive) heart failure: Secondary | ICD-10-CM | POA: Diagnosis present

## 2022-05-17 DIAGNOSIS — Z681 Body mass index (BMI) 19 or less, adult: Secondary | ICD-10-CM

## 2022-05-17 DIAGNOSIS — K2960 Other gastritis without bleeding: Secondary | ICD-10-CM | POA: Diagnosis not present

## 2022-05-17 DIAGNOSIS — G9341 Metabolic encephalopathy: Secondary | ICD-10-CM | POA: Diagnosis present

## 2022-05-17 DIAGNOSIS — D509 Iron deficiency anemia, unspecified: Secondary | ICD-10-CM | POA: Diagnosis present

## 2022-05-17 DIAGNOSIS — I2510 Atherosclerotic heart disease of native coronary artery without angina pectoris: Secondary | ICD-10-CM | POA: Diagnosis not present

## 2022-05-17 DIAGNOSIS — N170 Acute kidney failure with tubular necrosis: Secondary | ICD-10-CM | POA: Diagnosis not present

## 2022-05-17 DIAGNOSIS — R627 Adult failure to thrive: Secondary | ICD-10-CM

## 2022-05-17 DIAGNOSIS — I9589 Other hypotension: Secondary | ICD-10-CM | POA: Diagnosis present

## 2022-05-17 DIAGNOSIS — K5900 Constipation, unspecified: Secondary | ICD-10-CM | POA: Diagnosis present

## 2022-05-17 DIAGNOSIS — D500 Iron deficiency anemia secondary to blood loss (chronic): Secondary | ICD-10-CM | POA: Diagnosis not present

## 2022-05-17 DIAGNOSIS — I2721 Secondary pulmonary arterial hypertension: Secondary | ICD-10-CM | POA: Diagnosis present

## 2022-05-17 DIAGNOSIS — E861 Hypovolemia: Secondary | ICD-10-CM | POA: Diagnosis present

## 2022-05-17 DIAGNOSIS — E43 Unspecified severe protein-calorie malnutrition: Secondary | ICD-10-CM | POA: Diagnosis present

## 2022-05-17 DIAGNOSIS — N179 Acute kidney failure, unspecified: Secondary | ICD-10-CM | POA: Diagnosis present

## 2022-05-17 DIAGNOSIS — K2970 Gastritis, unspecified, without bleeding: Secondary | ICD-10-CM

## 2022-05-17 DIAGNOSIS — R791 Abnormal coagulation profile: Secondary | ICD-10-CM | POA: Diagnosis not present

## 2022-05-17 DIAGNOSIS — Z91048 Other nonmedicinal substance allergy status: Secondary | ICD-10-CM

## 2022-05-17 DIAGNOSIS — Z880 Allergy status to penicillin: Secondary | ICD-10-CM

## 2022-05-17 DIAGNOSIS — Z7401 Bed confinement status: Secondary | ICD-10-CM | POA: Diagnosis not present

## 2022-05-17 DIAGNOSIS — I493 Ventricular premature depolarization: Secondary | ICD-10-CM | POA: Diagnosis not present

## 2022-05-17 DIAGNOSIS — J849 Interstitial pulmonary disease, unspecified: Secondary | ICD-10-CM | POA: Diagnosis present

## 2022-05-17 DIAGNOSIS — Z7189 Other specified counseling: Secondary | ICD-10-CM | POA: Diagnosis not present

## 2022-05-17 DIAGNOSIS — D62 Acute posthemorrhagic anemia: Secondary | ICD-10-CM | POA: Diagnosis not present

## 2022-05-17 LAB — CBC WITH DIFFERENTIAL/PLATELET
Abs Immature Granulocytes: 0 10*3/uL (ref 0.00–0.07)
Absolute Monocytes: 667 cells/uL (ref 200–950)
Basophils Absolute: 31 cells/uL (ref 0–200)
Basophils Absolute: 0 10*3/uL (ref 0.0–0.1)
Basophils Relative: 0.8 %
Basophils Relative: 1 %
Eosinophils Absolute: 12 cells/uL — ABNORMAL LOW (ref 15–500)
Eosinophils Absolute: 0 10*3/uL (ref 0.0–0.5)
Eosinophils Relative: 0.3 %
Eosinophils Relative: 1 %
HCT: 29.1 % — ABNORMAL LOW (ref 38.5–50.0)
HCT: 27 % — ABNORMAL LOW (ref 39.0–52.0)
Hemoglobin: 9.4 g/dL — ABNORMAL LOW (ref 13.2–17.1)
Hemoglobin: 8.6 g/dL — ABNORMAL LOW (ref 13.0–17.0)
Immature Granulocytes: 0 %
Lymphocytes Relative: 16 %
Lymphs Abs: 355 cells/uL — ABNORMAL LOW (ref 850–3900)
Lymphs Abs: 0.3 10*3/uL — ABNORMAL LOW (ref 0.7–4.0)
MCH: 26 pg — ABNORMAL LOW (ref 27.0–33.0)
MCH: 24.2 pg — ABNORMAL LOW (ref 26.0–34.0)
MCHC: 32.3 g/dL (ref 32.0–36.0)
MCHC: 31.9 g/dL (ref 30.0–36.0)
MCV: 80.4 fL (ref 80.0–100.0)
MCV: 75.8 fL — ABNORMAL LOW (ref 80.0–100.0)
MPV: 9.9 fL (ref 7.5–12.5)
Monocytes Absolute: 0.4 10*3/uL (ref 0.1–1.0)
Monocytes Relative: 17.1 %
Monocytes Relative: 17 %
Neutro Abs: 2835 cells/uL (ref 1500–7800)
Neutro Abs: 1.3 10*3/uL — ABNORMAL LOW (ref 1.7–7.7)
Neutrophils Relative %: 72.7 %
Neutrophils Relative %: 65 %
Platelets: 84 10*3/uL — ABNORMAL LOW (ref 140–400)
Platelets: 107 10*3/uL — ABNORMAL LOW (ref 150–400)
RBC: 3.62 10*6/uL — ABNORMAL LOW (ref 4.20–5.80)
RBC: 3.56 MIL/uL — ABNORMAL LOW (ref 4.22–5.81)
RDW: 17.1 % — ABNORMAL HIGH (ref 11.0–15.0)
RDW: 19.8 % — ABNORMAL HIGH (ref 11.5–15.5)
Total Lymphocyte: 9.1 %
WBC: 3.9 10*3/uL (ref 3.8–10.8)
WBC: 2.1 10*3/uL — ABNORMAL LOW (ref 4.0–10.5)
nRBC: 0 % (ref 0.0–0.2)

## 2022-05-17 LAB — COMPREHENSIVE METABOLIC PANEL
AG Ratio: 0.9 (calc) — ABNORMAL LOW (ref 1.0–2.5)
ALT: 15 U/L (ref 9–46)
ALT: 21 U/L (ref 0–44)
AST: 28 U/L (ref 10–35)
AST: 33 U/L (ref 15–41)
Albumin: 3.8 g/dL (ref 3.6–5.1)
Albumin: 3.5 g/dL (ref 3.5–5.0)
Alkaline Phosphatase: 74 U/L (ref 38–126)
Alkaline phosphatase (APISO): 84 U/L (ref 35–144)
Anion gap: 14 (ref 5–15)
BUN/Creatinine Ratio: 26 (calc) — ABNORMAL HIGH (ref 6–22)
BUN: 63 mg/dL — ABNORMAL HIGH (ref 7–25)
BUN: 64 mg/dL — ABNORMAL HIGH (ref 8–23)
CO2: 28 mmol/L (ref 20–32)
CO2: 26 mmol/L (ref 22–32)
Calcium: 10 mg/dL (ref 8.6–10.3)
Calcium: 9.6 mg/dL (ref 8.9–10.3)
Chloride: 84 mmol/L — ABNORMAL LOW (ref 98–110)
Chloride: 86 mmol/L — ABNORMAL LOW (ref 98–111)
Creat: 2.38 mg/dL — ABNORMAL HIGH (ref 0.70–1.28)
Creatinine, Ser: 1.86 mg/dL — ABNORMAL HIGH (ref 0.61–1.24)
GFR, Estimated: 37 mL/min — ABNORMAL LOW (ref >60)
Globulin: 4.4 g/dL (calc) — ABNORMAL HIGH (ref 1.9–3.7)
Glucose, Bld: 71 mg/dL (ref 65–99)
Glucose, Bld: 79 mg/dL (ref 70–99)
Potassium: 4.2 mmol/L (ref 3.5–5.3)
Potassium: 3.6 mmol/L (ref 3.5–5.1)
Sodium: 125 mmol/L — ABNORMAL LOW (ref 135–146)
Sodium: 126 mmol/L — ABNORMAL LOW (ref 135–145)
Total Bilirubin: 3.8 mg/dL — ABNORMAL HIGH (ref 0.2–1.2)
Total Bilirubin: 3.6 mg/dL — ABNORMAL HIGH (ref 0.3–1.2)
Total Protein: 8.2 g/dL — ABNORMAL HIGH (ref 6.1–8.1)
Total Protein: 7.8 g/dL (ref 6.5–8.1)

## 2022-05-17 LAB — MAGNESIUM: Magnesium: 2.5 mg/dL (ref 1.5–2.5)

## 2022-05-17 LAB — PROTIME-INR
INR: 2.1 — ABNORMAL HIGH (ref 0.8–1.2)
Prothrombin Time: 23.7 seconds — ABNORMAL HIGH (ref 11.4–15.2)

## 2022-05-17 LAB — OSMOLALITY: Osmolality: 284 mOsm/kg (ref 275–295)

## 2022-05-17 LAB — VITAMIN B12: Vitamin B-12: >1504 pg/mL — ABNORMAL HIGH (ref 211–911)

## 2022-05-17 LAB — BRAIN NATRIURETIC PEPTIDE

## 2022-05-17 LAB — IRON: Iron: 24 ug/dL — ABNORMAL LOW (ref 50–180)

## 2022-05-17 MED ORDER — ACETAMINOPHEN 325 MG PO TABS
650.0000 mg | ORAL_TABLET | Freq: Four times a day (QID) | ORAL | Status: DC | PRN
  Administered 2022-05-18 – 2022-05-24 (×5): 650 mg via ORAL
  Filled 2022-05-17 (×5): qty 2

## 2022-05-17 MED ORDER — SELEXIPAG 200 MCG PO TABS
200.0000 ug | ORAL_TABLET | Freq: Two times a day (BID) | ORAL | Status: DC
  Administered 2022-05-18: 200 ug via ORAL

## 2022-05-17 MED ORDER — ACETAMINOPHEN 650 MG RE SUPP: 650.0000 mg | Freq: Four times a day (QID) | RECTAL | Status: DC | PRN

## 2022-05-17 MED ORDER — SENNOSIDES-DOCUSATE SODIUM 8.6-50 MG PO TABS
1.0000 | ORAL_TABLET | Freq: Every evening | ORAL | Status: DC | PRN
  Administered 2022-05-17: 1 via ORAL
  Filled 2022-05-17: qty 1

## 2022-05-17 MED ORDER — ALBUTEROL SULFATE (2.5 MG/3ML) 0.083% IN NEBU: 2.5000 mg | INHALATION_SOLUTION | Freq: Four times a day (QID) | RESPIRATORY_TRACT | Status: DC | PRN

## 2022-05-17 MED ORDER — ROSUVASTATIN CALCIUM 5 MG PO TABS
5.0000 mg | ORAL_TABLET | Freq: Every day | ORAL | Status: DC
  Administered 2022-05-18 – 2022-05-28 (×11): 5 mg via ORAL
  Filled 2022-05-17 (×12): qty 1

## 2022-05-17 MED ORDER — MACITENTAN 10 MG PO TABS
10.0000 mg | ORAL_TABLET | Freq: Every day | ORAL | Status: DC
  Administered 2022-05-18 – 2022-05-28 (×11): 10 mg via ORAL
  Filled 2022-05-17 (×14): qty 1

## 2022-05-17 MED ORDER — SODIUM CHLORIDE 0.9% FLUSH
3.0000 mL | Freq: Two times a day (BID) | INTRAVENOUS | Status: DC
  Administered 2022-05-17 – 2022-05-28 (×22): 3 mL via INTRAVENOUS

## 2022-05-17 MED ORDER — ONDANSETRON HCL 4 MG PO TABS: 4.0000 mg | ORAL_TABLET | Freq: Four times a day (QID) | ORAL | Status: DC | PRN

## 2022-05-17 MED ORDER — BUSPIRONE HCL 5 MG PO TABS
7.5000 mg | ORAL_TABLET | Freq: Two times a day (BID) | ORAL | Status: DC
  Administered 2022-05-18 – 2022-05-28 (×21): 7.5 mg via ORAL
  Filled 2022-05-17 (×22): qty 2

## 2022-05-17 MED ORDER — ONDANSETRON HCL 4 MG/2ML IJ SOLN
4.0000 mg | Freq: Four times a day (QID) | INTRAMUSCULAR | Status: DC | PRN
  Administered 2022-05-19: 4 mg via INTRAVENOUS
  Filled 2022-05-17: qty 2

## 2022-05-17 MED ORDER — TADALAFIL 20 MG PO TABS
20.0000 mg | ORAL_TABLET | Freq: Every day | ORAL | Status: DC
  Administered 2022-05-18 – 2022-05-28 (×11): 20 mg via ORAL
  Filled 2022-05-17 (×13): qty 1

## 2022-05-17 MED ORDER — SODIUM CHLORIDE 0.9 % IV SOLN
INTRAVENOUS | Status: AC
Start: 2022-05-17 — End: 2022-05-18

## 2022-05-17 MED ORDER — FAMOTIDINE 20 MG PO TABS
40.0000 mg | ORAL_TABLET | Freq: Two times a day (BID) | ORAL | Status: DC
  Administered 2022-05-17 – 2022-05-21 (×9): 40 mg via ORAL
  Filled 2022-05-17 (×9): qty 2

## 2022-05-17 MED ORDER — MELATONIN 5 MG PO TABS
5.0000 mg | ORAL_TABLET | Freq: Once | ORAL | Status: AC
  Administered 2022-05-17: 5 mg via ORAL
  Filled 2022-05-17: qty 1

## 2022-05-17 NOTE — Assessment & Plan Note (Signed)
Stable without wheezing or dyspnea on admission.  Continue albuterol as needed.

## 2022-05-17 NOTE — Plan of Care (Signed)
  Problem: Education: Goal: Knowledge of General Education information will improve Description: Including pain rating scale, medication(s)/side effects and non-pharmacologic comfort measures Outcome: Progressing   Problem: Clinical Measurements: Goal: Ability to maintain clinical measurements within normal limits will improve Outcome: Progressing Goal: Diagnostic test results will improve Outcome: Progressing   Problem: Activity: Goal: Risk for activity intolerance will decrease Outcome: Progressing   Problem: Nutrition: Goal: Adequate nutrition will be maintained Outcome: Progressing   Problem: Elimination: Goal: Will not experience complications related to bowel motility Outcome: Progressing Goal: Will not experience complications related to urinary retention Outcome: Progressing   Problem: Pain Managment: Goal: General experience of comfort will improve Outcome: Progressing   Problem: Safety: Goal: Ability to remain free from injury will improve Outcome: Progressing   Problem: Skin Integrity: Goal: Risk for impaired skin integrity will decrease Outcome: Progressing   Problem: Education: Goal: Ability to identify signs and symptoms of gastrointestinal bleeding will improve Outcome: Progressing   Problem: Bowel/Gastric: Goal: Will show no signs and symptoms of gastrointestinal bleeding Outcome: Progressing   Problem: Fluid Volume: Goal: Will show no signs and symptoms of excessive bleeding Outcome: Progressing   Problem: Clinical Measurements: Goal: Complications related to the disease process, condition or treatment will be avoided or minimized Outcome: Progressing

## 2022-05-17 NOTE — H&P (Signed)
History and Physical    Lance Hicks JTT:[id number] DOB: [birth year] DOA: 05/17/2022  PCP: [name], PA-C  Patient coming from: [facility]  I have personally briefly reviewed patient's old medical records in [place]  Chief Complaint: Weakness  HPI: Lance Hicks is a 76 y.o. male with medical history significant for chronic combined systolic and diastolic CHF (EF 00-92 percent, G2DD), CAD s/p CABG, chronic atrial fibrillation and aortic stenosis s/p mechanical aortic valve replacement on Coumadin, pulmonary arterial hypertension, COPD, CKD stage IIIa, chronic hypotension, hepatic cirrhosis, anemia of chronic disease and iron deficiency who was admitted from [facility] for evaluation of generalized weakness associated with acute on chronic anemia.  Patient states that he has been feeling very dehydrated recently.  He thinks he has been overdiuresed.  He has decreased urine output than expected without dysuria.  He reports chronic nausea and GERD symptoms.  He denies any emesis.  He was seen by his PCP yesterday (05/16/2022). Labs were obtained and notable for hemoglobin 9.4 (baseline 11-13), platelets 84,000, iron 24, sodium 125, creatinine 2.38 (baseline 1.5-1.6).  Patient was instructed to go to the ED for further evaluation.  He was seen at [facility] ED.  Repeat labs notable for hemoglobin 8.9, platelets 112,000, WBC 2.4, creatinine 2.0, sodium 126.  He had reported Hemoccult positive stool.  Patient was subsequently transferred to [facility] for further evaluation.  On arrival, patient reports seeing recent dark color stool without obvious bleeding.  He denies any chest pain, dyspnea, abdominal pain.  He reports constipation with last bowel movement several days ago.  Review of Systems: All systems reviewed and are negative except as documented in history of present illness above.   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 In light of concurrent left nephrolithiasis, will check CT renal colic next year instead of renal US.    Anticoagulated on Coumadin 01/04/2018   Anxiety 05/10/2016   Last Assessment & Plan:  Doing well off of zoloft.   Asthma    Asymptomatic microscopic hematuria 06/20/2017   Last Assessment & Plan:  Had hematuria workup in [facility] in 2016 which negative CT and cystoscopy. UA with 2+ blood last visit - we discussed recommendation for repeat workup at 5 years or if degree of hematuria progresses.    Atrial fibrillation ([place]) 03/29/2017   Atrial flutter ([place]) 03/29/2017   Backache 12/14/2015   Last Assessment & Plan:  Pain management referral for further evaluation.   Bilateral pleural effusion 08/09/2017   CHF (congestive heart failure) (HCC)    Chronic allergic rhinitis 04/28/2016   Last Assessment & Plan:  Continue astelin   Chronic anticoagulation 03/29/2017   Chronic atrial fibrillation ([place]) 07/23/2015   Last Assessment & Plan:  Coumadin and metoprolol, cardiology referral to establish care.   Chronic midline back pain 12/14/2015   Last Assessment & Plan:  Pain management referral for further evaluation.   Chronic obstructive lung disease ([place]) 06/12/2016   With hypoxia   Chronic prostatitis 07/23/2015   Last Assessment & Plan:  Has largely resolved since stopping bike riding. Recommend annual DRE AND PSA - will see back 12/2015 for annual screening, given 1st degree fhx. To call office for recurrent prostatitis symptoms.    Chronic respiratory failure  with hypoxia ([place]) 07/29/2019   Cirrhosis of liver not due to alcohol ([place]) 07/01/2019   COPD (chronic obstructive pulmonary disease) ([place]) 06/12/2016   Coronary arteriosclerosis in native artery 03/29/2017   Coronary artery disease involving native coronary artery of native  heart with angina pectoris ([place]) 03/29/2017   Cough 10/17/2016   Last Assessment & Plan:  Discussed typical course for acute viral illness. If symptoms worsen or fail to improve by 7-10d, delayed ATBs, fluids, rest, NSAIDs/APAP prn. Seek care if not improving. Needs earlier INR check due to ATBs.   Dyspnea 02/01/2016   Last Assessment & Plan:  Overall improving, eval by pulm, plan for CT, neg stress test with cardiology. Recent switch to carvedilol due to side effects.   Encounter for therapeutic drug monitoring 01/06/2019   Enterococcal bacteremia    Epidermoid cyst of skin 08/24/2017   Essential hypertension 12/14/2015   Last Assessment & Plan:  Hypertension control: controlled  Medications: compliant Medication Management: as noted in orders Home blood pressure monitoring recommended additionally as needed for symptoms  The patient's care plan was reviewed and updated. Instructions and counseling were provided regarding patient goals and barriers. He was counseled to adopt a healthy lifestyle. Educational resources and self-management tools have been provided as charted in [facility] list.    H/O maze procedure 03/29/2017   [name] 03/29/2017   Overview:  2011   History of coronary artery bypass graft 03/29/2017   Hx of CABG 03/29/2017   Hyperlipidemia 03/29/2017   Hypersensitivity angiitis ([place]) 10/01/2017   Hypertensive heart disease 03/29/2017   Hypertensive heart disease with heart failure ([place]) 03/29/2017   Hypertensive heart failure ([place]) 03/29/2017   Hypokalemia due to excessive renal loss of potassium 02/18/2018   Hypotension, chronic 06/06/2018   International normalized ratio (INR) raised 07/26/2017   Kidney stone 07/23/2015   Kidney stones 07/23/2015   Overview:  x 3  Last Assessment & Plan:  By [place] has left nephrolithiasis, but not visible by KUB. Will check CT renal colic next year to assess both stone burden as well as to surveil AML.    Left ureteral stone 01/23/2018    Leukocytoclastic vasculitis ([place]) 10/01/2017   Localized edema 01/04/2018   Long term (current) use of anticoagulants 03/29/2017   Lumbar radicular pain 01/19/2016   Lumbar radiculopathy 01/19/2016   Maculopapular rash 09/03/2017   Microscopic hematuria 06/20/2017   Last Assessment & Plan:  Had hematuria workup in [facility] in 2016 which negative CT and cystoscopy. UA with 2+ blood last visit - we discussed recommendation for repeat workup at 5 years or if degree of hematuria progresses.    Multiple nodules of lung 06/12/2016   Nasal discharge 02/25/2016   Last Assessment & Plan:  Trial zyrtec and flonase   Nephrolithiasis 07/23/2015   Overview:  x 3  Last Assessment & Plan:  By [place] has left nephrolithiasis, but not visible by KUB. Will check CT renal colic next year to assess both stone burden as well as to surveil AML.   Overview:  x 3  Last Assessment & Plan:  Has 96m nonobstructing LUP stone - not visible by KUB.  Will check renal [place]2019 - he will contact office sooner if symptomatic.    [place]-sustained ventricular tachycardia ([place] 03/29/2017   Nonsustained ventricular tachycardia ([place] 03/29/2017   Other hyperlipidemia 03/29/2017   Palpitations 10/01/2017   Pleural effusion, bilateral 08/09/2017   Pneumonia due to COVID-19 virus 02/07/2021   Post-nasal drainage 02/25/2016  Last Assessment & Plan:  Trial zyrtec and flonase   Prostate cancer screening 06/20/2017   Last Assessment & Plan:  Recommend continued annual CaP screening until within 10 years of life expectancy. Given good health and fhx of longevity, would anticipate CaP screening to continue until age 21.  PSA today and again in one year on day of visit.   Pulmonary arterial hypertension ([place]) 02/20/2018   Pulmonary edema    Pulmonary hypertension ([place]) 08/09/2017   Pulmonary nodules 06/12/2016   S/P AVR 03/20/2016   S/P AVR (aortic valve replacement) 03/20/2016   Strain of deltoid muscle, initial encounter    Supratherapeutic INR 07/26/2017   Syncope and  collapse 02/01/2016   Typical atrial flutter ([place]) 02/01/2016    Past Surgical History:  Procedure Laterality Date   CHOLECYSTECTOMY     CORONARY ARTERY BYPASS GRAFT     EXPLORATORY LAPAROTOMY  07/30/2017   FOOT SURGERY     FRACTURE SURGERY Right    wrist and forearm   HERNIA REPAIR     MECHANICAL AORTIC VALVE REPLACEMENT     NASAL SINUS SURGERY     RIGHT HEART CATH N/A 01/18/2018   Procedure: RIGHT HEART CATH;  Surgeon: Jolaine Artist, MD;  Location: [place] [facility];  Service: Cardiovascular;  Laterality: N/A;   RIGHT HEART CATH N/A 05/14/2018   Procedure: RIGHT HEART CATH;  Surgeon: Jolaine Artist, MD;  Location: [place] [facility];  Service: Cardiovascular;  Laterality: N/A;   RIGHT HEART CATH N/A 09/29/2021   Procedure: RIGHT HEART CATH;  Surgeon: Jolaine Artist, MD;  Location: [place] [facility];  Service: Cardiovascular;  Laterality: N/A;   TEE WITHOUT CARDIOVERSION N/A 05/21/2018   Procedure: TRANSESOPHAGEAL ECHOCARDIOGRAM (TEE);  Surgeon: Jolaine Artist, MD;  Location: [facility] ENDOSCOPY;  Service: Cardiovascular;  Laterality: N/A;   UPPER GASTROINTESTINAL ENDOSCOPY  07/12/2017   Patchy areas of mucosal inflammation noted in the antrum with edema,erthema and ulcerations. Bx. Chronicfocally active gastritis.   VASECTOMY      Social History:  reports that he quit smoking about 21 years ago. His smoking use included cigarettes. He has a 68.00 pack-year smoking history. He has never used smokeless tobacco. He reports that he does not currently use alcohol. He reports that he does not use drugs.  Allergies  Allergen Reactions   Augmentin [Amoxicillin-Pot Clavulanate] Anaphylaxis and Diarrhea    "Upset stomach"   Midodrine     Other reaction(s): See Comments   Amoxicillin     Other reaction(s): Nausea/Vomiting   Chicken Allergy Other (See Comments)    Other reaction(s): Unknown   Clavulanic Acid     Other reaction(s): Nausea/Vomiting   Pantoprazole Sodium Nausea  Only    Gets gassy and starting itching like crazy Gets gassy and starting itching like crazy   Tizanidine     Other reaction(s): See Comments   Valium [Diazepam] Other (See Comments)    HA and Abd pain.   Tape Rash and Other (See Comments)    Surgical tape   Wound Dressing Adhesive Other (See Comments) and Rash    Surgical tape Surgical tape Surgical tape    Family History  Problem Relation Age of Onset   Asthma Mother    Arthritis Mother    Heart attack Father    Hypertension Father    Stroke Paternal Grandmother    Colon cancer Neg Hx      Prior to Admission medications   Medication Sig Start Date End  Date Taking? Authorizing Provider  famotidine (PEPCID) 40 MG tablet Take 1 tablet (40 mg total) by mouth 2 (two) times daily. 05/11/22   Jackquline Denmark, MD  acetaminophen (TYLENOL) 500 MG tablet 1,000 mg. 02/12/22   [provider]  albuterol (VENTOLIN HFA) 108 (90 Base) MCG/ACT inhaler INHALE 2 PUFFS INTO THE LUNGS EVERY 6 HOURS AS NEEDED FOR WHEEZING OR SHORTNESS OF BREATH 04/21/22   Collene Gobble, MD  ammonium lactate (LAC-HYDRIN) 12 % lotion Apply 1 application topically as needed for dry skin.    [provider]  ascorbic acid (VITAMIN C) 500 MG tablet 500 mg. 02/12/22   [provider]  busPIRone (BUSPAR) 7.5 MG tablet Take 1 tablet (7.5 mg total) by mouth 2 (two) times daily. 02/17/22   Saguier, Percell Miller, PA-C  Cholecalciferol 50 MCG (2000 UT) CHEW 1200 02/12/22   [provider]  Coenzyme Q10 100 MG capsule 100 mg. 02/12/22   [provider]  dapagliflozin propanediol (FARXIGA) 10 MG TABS tablet Take 1 tablet (10 mg total) by mouth daily before breakfast. 10/14/21   [place], Maricela Bo, FNP  dexlansoprazole (DEXILANT) 60 MG capsule Take 1 capsule (60 mg total) by mouth daily. 05/09/22   Jackquline Denmark, MD  ferrous sulfate 325 (65 FE) MG tablet Take 1 tablet (325 mg total) by mouth every other day. 01/31/22 05/01/22  [place] Eric J, DO   KRILL OIL PO Take 800 mg by mouth every other day. In the morning    [provider]  magnesium oxide (MAG-OX) 400 MG tablet 400 mg. 02/12/22   [provider]  metFORMIN (GLUCOPHAGE) 500 MG tablet Take 500 mg by mouth daily. 02/08/22   [provider]  ondansetron (ZOFRAN) 8 MG tablet Take 1 tablet (8 mg total) by mouth every 8 (eight) hours as needed for nausea or vomiting. 12/20/21   Marrian Salvage, FNP  OPSUMIT 10 MG tablet Take 10 mg by mouth daily. 03/14/22   [provider]  potassium chloride (KLOR-CON M) 10 MEQ tablet Take by mouth. 03/11/22   [provider]  RABEprazole (ACIPHEX) 20 MG tablet Take 1 tablet (20 mg total) by mouth daily. 02/21/22   Jackquline Denmark, MD  REFRESH TEARS 0.5 % SOLN Apply to eye. 02/08/22   [provider]  rosuvastatin (CRESTOR) 5 MG tablet Take 1 tablet (5 mg total) by mouth daily. 01/31/22 05/01/22  [place] Eric J, DO  Selexipag 200 MCG TABS Take 1 tablet (200 mcg total) by mouth 2 (two) times daily. 02/04/22   Arrien, Jimmy Picket, MD  Spacer/Aero-Holding Josiah Lobo ([place]) [place]  01/21/22   [provider]  spironolactone (ALDACTONE) 25 MG tablet Take 1 tablet (25 mg total) by mouth daily. 02/04/22 05/05/22  Arrien, Jimmy Picket, MD  tadalafil (CIALIS) 20 MG tablet Take by mouth. 02/06/22   [provider]  thiamine 100 MG tablet Take 100 mg by mouth daily. 02/08/22   [provider]  tiZANidine (ZANAFLEX) 2 MG tablet Take 1 tablet (2 mg total) by mouth every 8 (eight) hours as needed for muscle spasms. 04/06/22   Mcarthur Rossetti, MD  torsemide (DEMADEX) 20 MG tablet Take 1 tablet (20 mg total) by mouth daily. Patient taking differently: Take 40 mg by mouth 2 (two) times daily. 02/04/22 05/05/22  Arrien, Jimmy Picket, MD  traMADol (ULTRAM) 50 MG tablet Take 50 mg by mouth every 8 (eight) hours as needed. 03/03/22   [provider]  Turmeric 500 MG TABS  Take 1 capsule by mouth daily in the afternoon. With Ginger 50 mg    [provider]  vitamin B-12 (CYANOCOBALAMIN) 1000 MCG tablet Take 1,000 mcg by mouth daily at 12 noon.    [provider]  warfarin (COUMADIN) 5 MG tablet See admin instructions. Take a full tablet 2 days a week and 1/2 tablet 5 days a week. 01/31/22 05/01/22  [place] Donnamarie Poag, DO  Zinc 50 MG TABS Take 50 mg by mouth every other day. In the afternoon    [provider]    Physical Exam: Vitals:   05/17/22 1856 05/17/22 1958  BP: 107/68   Pulse: 63   Resp: 16   Temp: (!) 94.6 F (34.8 C)   TempSrc: Rectal   SpO2: 97%   Weight: 52 kg   Height:  _0  (1.753 m)   Constitutional: Thin man resting in bed, NAD, calm, comfortable Eyes: EOMI, lids and conjunctivae normal ENMT: Mucous membranes are moist. Posterior pharynx clear of any exudate or lesions.Normal dentition.  Neck: normal, supple, no masses. Respiratory: clear to auscultation bilaterally, no wheezing, no crackles. Normal respiratory effort. No accessory muscle use.  Cardiovascular: Irregularly irregular, bradycardic, no murmurs / rubs / gallops. No extremity edema. 2+ pedal pulses. Abdomen: no tenderness, no masses palpated. No hepatosplenomegaly.  Musculoskeletal: no clubbing / cyanosis. No joint deformity upper and lower extremities. Good ROM, no contractures. Normal muscle tone.  Skin: no rashes, lesions, ulcers. No induration Neurologic: CN 2-12 grossly intact. Sensation intact. Strength 5/5 in all 4.  Psychiatric: Normal judgment and insight. Alert and oriented x 3. Normal mood.   EKG: Ordered and pending.  Assessment/Plan Principal Problem:   Acute renal failure superimposed on stage 3a chronic kidney disease (HCC) Active Problems:   Hyponatremia   Acute on chronic anemia   Coronary artery disease involving native coronary artery of native heart with angina pectoris (HCC)   [name]    Pulmonary arterial hypertension (HCC)   Hypotension, chronic   Chronic atrial fibrillation (HCC)   COPD (chronic obstructive pulmonary disease) (HCC)   Chronic combined systolic and diastolic CHF (congestive heart failure) ([place])   Lance Hicks is a 76 y.o. male with medical history significant for chronic combined systolic and diastolic CHF (EF 07-37 percent, G2DD), CAD s/p CABG, chronic atrial fibrillation and aortic stenosis s/p mechanical aortic valve replacement on Coumadin, pulmonary arterial hypertension, COPD, CKD stage IIIa, chronic hypotension, hepatic cirrhosis, anemia of chronic disease and iron deficiency who was admitted from [facility] with AKI on CKD stage IIIa, hyponatremia, and acute on chronic anemia.  Assessment and Plan: * Acute renal failure superimposed on stage 3a chronic kidney disease (HCC) Creatinine 2.38 prior to arrival with baseline 1.5-1.6.  Appears volume depleted on admission. -Start on gentle IV fluids with NS_1  mL/hour overnight -Hold spironolactone, metformin, Farxiga -Monitor UOP  Hyponatremia Sodium 126, history of chronic hyponatremia. -Started on IV NS at 75 mL/hour as above -Follow urine studies and osmolality  Acute on chronic anemia Hemoglobin slightly decreased to 8.9 at outside hospital compared to baseline 11-13.  Denies obvious bleeding but has history of chronically positive FOBT stools.  Follows with [facility], Dr. Lyndel Safe.  High risk for endoscopic evaluation due to his cardiopulmonary disease per recent documentation. -Repeat CBC and INR -Hold Coumadin for now but low threshold to restart given mechanical AVR and A-fib  Chronic combined systolic and diastolic CHF (congestive heart failure) ([place]) Appears hypovolemic on admission.  Last  EF 45-50% with G2 DD on TTE 09/30/2021. -On gentle IV fluids as above -Strict I/O's and daily weights -Holding spironolactone and Farxiga with AKI  COPD (chronic obstructive pulmonary disease)  (HCC) Stable without wheezing or dyspnea on admission.  Continue albuterol as needed.  Chronic atrial fibrillation (HCC) Remains in atrial fibrillation with controlled rate.  Not on rate controlling agents due to history of bradycardia.  Holding Coumadin for now as above.  Hypotension, chronic BP currently stable.  Has been on midodrine in the past.  Pulmonary arterial hypertension (HCC) Continue home selexipag, macitentan, tadalafil.  [name] Coumadin on hold for now as above.  Coronary artery disease involving native coronary artery of native heart with angina pectoris [facility]) S/p CABG.  Denies any chest pain.  Continue statin.  DVT prophylaxis: SCDs Start: 05/17/22 1931 Code Status: Full code, confirmed on admission Family Communication: Discussed with patient, he has discussed with family Disposition Plan: From home, dispo pending clinical progress Consults called: None Severity of Illness: The appropriate patient status for this patient is INPATIENT. Inpatient status is judged to be reasonable and necessary in order to provide the required intensity of service to ensure the patient's safety. The patient's presenting symptoms, physical exam findings, and initial radiographic and laboratory data in the context of their chronic comorbidities is felt to place them at high risk for further clinical deterioration. Furthermore, it is not anticipated that the patient will be medically stable for discharge from the hospital within 2 midnights of admission.   * I certify that at the point of admission it is my clinical judgment that the patient will require inpatient hospital care spanning beyond 2 midnights from the point of admission due to high intensity of service, high risk for further deterioration and high frequency of surveillance required.Zada Finders MD [facility]  If 7PM-7AM, please contact night-coverage [email or web address]  05/17/2022, 8:49  PM

## 2022-05-17 NOTE — Assessment & Plan Note (Signed)
S/p CABG.  Denies any chest pain.  Continue statin.

## 2022-05-17 NOTE — Assessment & Plan Note (Signed)
BP currently stable.  Has been on midodrine in the past.

## 2022-05-17 NOTE — Telephone Encounter (Signed)
They will have to call [facility] for any hospital transfers.  It needs to be accepted by hospitalist team as they will be the admitting physicians depending on bed availability.  Thank you

## 2022-05-17 NOTE — Telephone Encounter (Signed)
Received a call from Carolynne Edouard PA at [facility] stating that the pt is currently there with dehydration/ GI Bleed, Hemoccult Positive stool. HGB 8.9: Multiple other labs abnormal. Ruby Cola stated that he is going to transfer him to [place] to be seen by GI provider

## 2022-05-17 NOTE — Assessment & Plan Note (Addendum)
Hemoglobin slightly decreased to 8.9 at outside hospital compared to baseline 11-13.  Denies obvious bleeding but has history of chronically positive FOBT stools.  Follows with [facility], Dr. Lyndel Safe.  High risk for endoscopic evaluation due to his cardiopulmonary disease per recent documentation. Per GI no further investigation at this time -Hemoglobin seems stable -Continue to monitor

## 2022-05-17 NOTE — Progress Notes (Signed)
   05/17/22 1856  Assess: MEWS Score  Temp (!) 94.6 F (34.8 C)  BP 107/68  Pulse Rate 63  Resp 16  Level of Consciousness Alert  SpO2 97 %  O2 Device Room Air  Assess: MEWS Score  MEWS Temp 2  MEWS Systolic 0  MEWS Pulse 0  MEWS RR 0  MEWS LOC 0  MEWS Score 2  MEWS Score Color Yellow  Assess: if the MEWS score is Yellow or Red  Were vital signs taken at a resting state? Yes  Focused Assessment No change from prior assessment  Does the patient meet 2 or more of the SIRS criteria? No  MEWS guidelines implemented *See Row Information* Yes  Treat  MEWS Interventions Escalated (See documentation below)  Pain Scale 0-10  Pain Score 0  Take Vital Signs  Increase Vital Sign Frequency  Yellow: Q 2hr X 2 then Q 4hr X 2, if remains yellow, continue Q 4hrs  Escalate  MEWS: Escalate Yellow: discuss with charge nurse/RN and consider discussing with provider and RRT  Notify: Charge Nurse/RN  Name of Charge Nurse/RN Notified Sharyn Blitz, RN  Date Charge Nurse/RN Notified 05/17/22  Time Charge Nurse/RN Notified 1909  Notify: Provider  Provider Name/Title Zada Finders, MD  Date Provider Notified 05/17/22  Time Provider Notified 1909  Method of Notification  (secure chat)  Date of Provider Response 05/17/22  Time of Provider Response 1909  Assess: SIRS CRITERIA  SIRS Temperature  1  SIRS Pulse 0  SIRS Respirations  0  SIRS WBC 0  SIRS Score Sum  1   Warm blankets and hot packs applied.  Angie Fava, RN

## 2022-05-17 NOTE — Assessment & Plan Note (Signed)
Remains in atrial fibrillation with controlled rate.  Not on rate controlling agents due to history of bradycardia.  Holding Coumadin for now as above.

## 2022-05-17 NOTE — Assessment & Plan Note (Signed)
Appears hypovolemic on admission.  Last EF 45-50% with G2 DD on TTE 09/30/2021. -On gentle IV fluids as above -Strict I/O's and daily weights -Holding spironolactone and Farxiga with AKI

## 2022-05-17 NOTE — Assessment & Plan Note (Signed)
Coumadin on hold for now as above.

## 2022-05-17 NOTE — Assessment & Plan Note (Addendum)
Sodium 126, history of chronic hyponatremia. Sodium appears to be at baseline.  Slight worsening after getting some IV fluid. -Stop IV fluid. -Continue to monitor

## 2022-05-17 NOTE — Telephone Encounter (Signed)
Notified Carolynne Edouard PA of Dr. Silverio Decamp recommendations: Ruby Cola stated that the pt has already been accepted by the Hospitalist

## 2022-05-17 NOTE — Assessment & Plan Note (Addendum)
Creatinine 2.38 prior to arrival with baseline 1.5-1.6.  Appears volume depleted on admission.  Creatinine improved to 1.51 today. -Stop IV fluid -Keep holding spironolactone, metformin, [name] -Monitor UOP

## 2022-05-17 NOTE — Assessment & Plan Note (Signed)
Continue home selexipag, macitentan, tadalafil.

## 2022-05-17 NOTE — Hospital Course (Addendum)
Lance Hicks is a 76 y.o. male with medical history significant for chronic combined systolic and diastolic CHF (EF 56-31 percent, G2DD), CAD s/p CABG, chronic atrial fibrillation and aortic stenosis s/p mechanical aortic valve replacement on Coumadin, pulmonary arterial hypertension, COPD, CKD stage IIIa, chronic hypotension, hepatic cirrhosis, anemia of chronic disease and iron deficiency who was admitted from [facility] with AKI on CKD stage IIIa, hyponatremia, and acute on chronic anemia.   Patient was then seen by his PCP on 5/23 where they obtained blood work, that showed hemoglobin of 8.9 with Hemoccult positive.  Transferred to [place] long and seen by [employer]  Coumadin was initially held but because of mech. heart valve, was restarted since no procedure planned.  Coumadin is managed and dosed by pharmacy. Patient has significant bruising on exam.  5/30: Patient continued to improve, renal function improving and now close to baseline. Per swallow team evaluation patient is moderate risk for aspiration and they were recommending barium swallow. Patient also has chronic hyponatremia which seems stable.  Patient was also found to have acute on chronic anemia, FOBT was positive and gastroenterology was consulted.  No plan for any acute intervention at this time. Patient has received IV iron. Is currently on Coumadin due to mechanical heart valve,  Patient also has an history of chronic combined systolic and diastolic heart failure with EF of 45 to 50% and grade 2 diastolic dysfunction according to echo done in October 2022. Clinically appears euvolemic.  Home dose of spironolactone and torsemide was being held due to AKI and softer blood pressure.  Patient was on midodrine at home which he is continuing. PT is recommending SNF.  Patient had his barium swallow which shows severe oropharyngeal dysphagia and swallow team is only recommending ice chips and goals of care discussion.   If he wants to continue eating and drinking then he should be doing with the risk in mind. Palliative care was consulted.

## 2022-05-18 ENCOUNTER — Inpatient Hospital Stay (HOSPITAL_COMMUNITY): Payer: Medicare Other

## 2022-05-18 DIAGNOSIS — N179 Acute kidney failure, unspecified: Secondary | ICD-10-CM | POA: Diagnosis not present

## 2022-05-18 DIAGNOSIS — I5042 Chronic combined systolic (congestive) and diastolic (congestive) heart failure: Secondary | ICD-10-CM | POA: Diagnosis not present

## 2022-05-18 DIAGNOSIS — K2970 Gastritis, unspecified, without bleeding: Secondary | ICD-10-CM | POA: Insufficient documentation

## 2022-05-18 DIAGNOSIS — I25119 Atherosclerotic heart disease of native coronary artery with unspecified angina pectoris: Secondary | ICD-10-CM

## 2022-05-18 DIAGNOSIS — I482 Chronic atrial fibrillation, unspecified: Secondary | ICD-10-CM | POA: Diagnosis not present

## 2022-05-18 DIAGNOSIS — I2721 Secondary pulmonary arterial hypertension: Secondary | ICD-10-CM

## 2022-05-18 DIAGNOSIS — J449 Chronic obstructive pulmonary disease, unspecified: Secondary | ICD-10-CM

## 2022-05-18 DIAGNOSIS — D649 Anemia, unspecified: Secondary | ICD-10-CM | POA: Diagnosis not present

## 2022-05-18 DIAGNOSIS — Z7901 Long term (current) use of anticoagulants: Secondary | ICD-10-CM

## 2022-05-18 DIAGNOSIS — R195 Other fecal abnormalities: Secondary | ICD-10-CM | POA: Diagnosis not present

## 2022-05-18 DIAGNOSIS — D500 Iron deficiency anemia secondary to blood loss (chronic): Secondary | ICD-10-CM | POA: Diagnosis not present

## 2022-05-18 DIAGNOSIS — Z952 Presence of prosthetic heart valve: Secondary | ICD-10-CM

## 2022-05-18 DIAGNOSIS — K2960 Other gastritis without bleeding: Secondary | ICD-10-CM

## 2022-05-18 LAB — IRON AND TIBC
Iron: 18 ug/dL — ABNORMAL LOW (ref 45–182)
Saturation Ratios: 4 % — ABNORMAL LOW (ref 17.9–39.5)
TIBC: 423 ug/dL (ref 250–450)
UIBC: 405 ug/dL

## 2022-05-18 LAB — COMPREHENSIVE METABOLIC PANEL
ALT: 18 U/L (ref 0–44)
AST: 29 U/L (ref 15–41)
Albumin: 3.2 g/dL — ABNORMAL LOW (ref 3.5–5.0)
Alkaline Phosphatase: 67 U/L (ref 38–126)
Anion gap: 11 (ref 5–15)
BUN: 61 mg/dL — ABNORMAL HIGH (ref 8–23)
CO2: 26 mmol/L (ref 22–32)
Calcium: 9.5 mg/dL (ref 8.9–10.3)
Chloride: 90 mmol/L — ABNORMAL LOW (ref 98–111)
Creatinine, Ser: 1.72 mg/dL — ABNORMAL HIGH (ref 0.61–1.24)
GFR, Estimated: 41 mL/min — ABNORMAL LOW (ref >60)
Glucose, Bld: 49 mg/dL — ABNORMAL LOW (ref 70–99)
Potassium: 3.3 mmol/L — ABNORMAL LOW (ref 3.5–5.1)
Sodium: 127 mmol/L — ABNORMAL LOW (ref 135–145)
Total Bilirubin: 3.3 mg/dL — ABNORMAL HIGH (ref 0.3–1.2)
Total Protein: 7.2 g/dL (ref 6.5–8.1)

## 2022-05-18 LAB — CBC
HCT: 26 % — ABNORMAL LOW (ref 39.0–52.0)
Hemoglobin: 8.3 g/dL — ABNORMAL LOW (ref 13.0–17.0)
MCH: 24.3 pg — ABNORMAL LOW (ref 26.0–34.0)
MCHC: 31.9 g/dL (ref 30.0–36.0)
MCV: 76 fL — ABNORMAL LOW (ref 80.0–100.0)
Platelets: 98 10*3/uL — ABNORMAL LOW (ref 150–400)
RBC: 3.42 MIL/uL — ABNORMAL LOW (ref 4.22–5.81)
RDW: 19.7 % — ABNORMAL HIGH (ref 11.5–15.5)
WBC: 1.9 10*3/uL — ABNORMAL LOW (ref 4.0–10.5)
nRBC: 0 % (ref 0.0–0.2)

## 2022-05-18 LAB — GLUCOSE, CAPILLARY
Glucose-Capillary: 161 mg/dL — ABNORMAL HIGH (ref 70–99)
Glucose-Capillary: 49 mg/dL — ABNORMAL LOW (ref 70–99)
Glucose-Capillary: 129 mg/dL — ABNORMAL HIGH (ref 70–99)

## 2022-05-18 LAB — PROTIME-INR
INR: 2.2 — ABNORMAL HIGH (ref 0.8–1.2)
Prothrombin Time: 23.9 seconds — ABNORMAL HIGH (ref 11.4–15.2)

## 2022-05-18 LAB — URINALYSIS, ROUTINE W REFLEX MICROSCOPIC
Bilirubin Urine: NEGATIVE
Glucose, UA: 150 mg/dL — AB
Hgb urine dipstick: NEGATIVE
Ketones, ur: NEGATIVE mg/dL
Leukocytes,Ua: NEGATIVE
Nitrite: NEGATIVE
Protein, ur: NEGATIVE mg/dL
Specific Gravity, Urine: 1.009 (ref 1.005–1.030)
pH: 6 (ref 5.0–8.0)

## 2022-05-18 LAB — SODIUM, URINE, RANDOM: Sodium, Ur: 16 mmol/L

## 2022-05-18 LAB — FERRITIN: Ferritin: 20 ng/mL — ABNORMAL LOW (ref 24–336)

## 2022-05-18 LAB — MAGNESIUM: Magnesium: 2.2 mg/dL (ref 1.7–2.4)

## 2022-05-18 LAB — OSMOLALITY, URINE: Osmolality, Ur: 309 mOsm/kg (ref 300–900)

## 2022-05-18 MED ORDER — HYDRALAZINE HCL 20 MG/ML IJ SOLN: 10.0000 mg | INTRAMUSCULAR | Status: DC | PRN

## 2022-05-18 MED ORDER — TRAZODONE HCL 50 MG PO TABS
25.0000 mg | ORAL_TABLET | Freq: Once | ORAL | Status: AC
  Administered 2022-05-18: 25 mg via ORAL
  Filled 2022-05-18: qty 1

## 2022-05-18 MED ORDER — DOCUSATE SODIUM 100 MG PO CAPS
100.0000 mg | ORAL_CAPSULE | Freq: Two times a day (BID) | ORAL | Status: DC
  Administered 2022-05-18 – 2022-05-28 (×10): 100 mg via ORAL
  Filled 2022-05-18 (×15): qty 1

## 2022-05-18 MED ORDER — LACTATED RINGERS IV BOLUS
250.0000 mL | Freq: Once | INTRAVENOUS | Status: AC
  Administered 2022-05-18: 250 mL via INTRAVENOUS

## 2022-05-18 MED ORDER — POLYETHYLENE GLYCOL 3350 17 G PO PACK
17.0000 g | PACK | Freq: Two times a day (BID) | ORAL | Status: DC
  Administered 2022-05-18 – 2022-05-28 (×9): 17 g via ORAL
  Filled 2022-05-18 (×14): qty 1

## 2022-05-18 MED ORDER — DEXTROSE 50 % IV SOLN
INTRAVENOUS | Status: AC
  Administered 2022-05-18: 50 mL
  Filled 2022-05-18: qty 50

## 2022-05-18 MED ORDER — SODIUM CHLORIDE 0.9 % IV SOLN
510.0000 mg | Freq: Once | INTRAVENOUS | Status: AC
  Administered 2022-05-18: 510 mg via INTRAVENOUS
  Filled 2022-05-18: qty 17

## 2022-05-18 MED ORDER — PANTOPRAZOLE SODIUM 40 MG IV SOLR
40.0000 mg | Freq: Two times a day (BID) | INTRAVENOUS | Status: DC
Start: 2022-05-18 — End: 2022-05-26
  Administered 2022-05-18 – 2022-05-26 (×17): 40 mg via INTRAVENOUS
  Filled 2022-05-18 (×17): qty 10

## 2022-05-18 MED ORDER — METOPROLOL TARTRATE 5 MG/5ML IV SOLN: 5.0000 mg | INTRAVENOUS | Status: DC | PRN

## 2022-05-18 MED ORDER — SUCRALFATE 1 G PO TABS
1.0000 g | ORAL_TABLET | Freq: Two times a day (BID) | ORAL | Status: DC
  Administered 2022-05-18 – 2022-05-28 (×21): 1 g via ORAL
  Filled 2022-05-18 (×21): qty 1

## 2022-05-18 MED ORDER — TRAZODONE HCL 50 MG PO TABS
50.0000 mg | ORAL_TABLET | Freq: Every evening | ORAL | Status: DC | PRN
  Administered 2022-05-18 – 2022-05-27 (×6): 50 mg via ORAL
  Filled 2022-05-18 (×6): qty 1

## 2022-05-18 MED ORDER — IPRATROPIUM-ALBUTEROL 0.5-2.5 (3) MG/3ML IN SOLN
3.0000 mL | RESPIRATORY_TRACT | Status: DC | PRN
Start: 2022-05-18 — End: 2022-05-29
  Administered 2022-05-27 – 2022-05-29 (×2): 3 mL via RESPIRATORY_TRACT
  Filled 2022-05-18 (×2): qty 3

## 2022-05-18 MED ORDER — DEXTROSE-NACL 5-0.45 % IV SOLN
INTRAVENOUS | Status: AC
Start: 2022-05-18 — End: 2022-05-19

## 2022-05-18 NOTE — TOC Initial Note (Signed)
Transition of Care [facility]) - Initial/Assessment Note    Patient Details  Name: Lance Hicks MRN: [id number] Date of Birth: 10-17-1946  Transition of [facility] CM/SW Contact:    Leeroy Cha, RN Phone Number: 05/18/2022, 9:14 AM  Clinical Narrative:                  Transition of Care [facility]) Screening Note   Patient Details  Name: Lance Hicks Date of Birth: 06-24-1946   Transition of Care [facility]) CM/SW Contact:    Leeroy Cha, RN Phone Number: 05/18/2022, 9:14 AM    Transition of Care Department [facility]) has reviewed patient and no TOC needs have been identified at this time. We will continue to monitor patient advancement through interdisciplinary progression rounds. If new patient transition needs arise, please place a TOC consult.    Expected Discharge Plan: Home/Self Care Barriers to Discharge: Continued Medical Work up   Patient Goals and CMS Choice Patient states their goals for this hospitalization and ongoing recovery are:: to go home CMS Medicare.gov Compare Post Acute Care list provided to:: Patient    Expected Discharge Plan and Services Expected Discharge Plan: Home/Self Care   Discharge Planning Services: CM Consult   Living arrangements for the past 2 months: Single Family Home                                      Prior Living Arrangements/Services Living arrangements for the past 2 months: Single Family Home Lives with:: Self Patient language and need for interpreter reviewed:: Yes Do you feel safe going back to the place where you live?: Yes            Criminal Activity/Legal Involvement Pertinent to Current Situation/Hospitalization: No - Comment as needed  Activities of Daily Living Home Assistive Devices/Equipment: None ADL Screening (condition at time of admission) Patient's cognitive ability adequate to safely complete daily activities?: Yes Is the patient deaf or have difficulty hearing?: No Does the patient  have difficulty seeing, even when wearing glasses/contacts?: No Does the patient have difficulty concentrating, remembering, or making decisions?: No Patient able to express need for assistance with ADLs?: Yes Does the patient have difficulty dressing or bathing?: No Independently performs ADLs?: Yes (appropriate for developmental age) Does the patient have difficulty walking or climbing stairs?: No Weakness of Legs: Both Weakness of Arms/Hands: None  Permission Sought/Granted                  Emotional Assessment Appearance:: Appears stated age     Orientation: : Oriented to Self, Oriented to Place, Oriented to  Time, Oriented to Situation Alcohol / Substance Use: Not Applicable Psych Involvement: No (comment)  Admission diagnosis:  Acute renal failure superimposed on stage 3a chronic kidney disease ([place]) [N17.9, N18.31] Patient Active Problem List   Diagnosis Date Noted   Acute renal failure superimposed on stage 3a chronic kidney disease ([place]) 05/17/2022   Diverticulosis 03/30/2022   Hyponatremia 03/30/2022   Mass of lung 03/30/2022   Medication adverse effect 03/30/2022   Abdominal pain 01/31/2022   Protein-calorie malnutrition, severe ([place]) 01/28/2022   Physical deconditioning 01/25/2022   GERD (gastroesophageal reflux disease) 01/25/2022   Iron deficiency anemia 01/25/2022   Alcohol abuse 01/25/2022   Acute kidney injury superimposed on chronic kidney disease ([place]) 50/72/2575   Acute systolic CHF (congestive heart failure) ([place]) 01/23/2022   Chronic combined systolic and  diastolic CHF (congestive heart failure) ([place]) 11/03/2021   ILD (interstitial lung disease) ([place]) 08/04/2021   Fall 06/21/2021   Asthma    Pneumonia due to COVID-19 virus 02/07/2021   Cirrhosis of liver not due to alcohol ([place]) 07/01/2019   Acute on chronic anemia 07/01/2019   Hypotension, chronic 06/06/2018   Enterococcal bacteremia    Pulmonary edema    Strain of deltoid muscle, initial  encounter    Pulmonary arterial hypertension ([place]) 02/20/2018   Hypokalemia due to excessive renal loss of potassium 02/18/2018   Left ureteral stone 01/23/2018   Anticoagulated on Coumadin 01/04/2018   Localized edema 01/04/2018   Leukocytoclastic vasculitis ([place]) 10/01/2017   Hypersensitivity angiitis ([place]) 10/01/2017   Maculopapular rash 09/03/2017   Epidermoid cyst of skin 08/24/2017   Bilateral pleural effusion 08/09/2017   Pleural effusion, bilateral 08/09/2017   Supratherapeutic INR 07/26/2017   International normalized ratio (INR) raised 07/26/2017   Microscopic hematuria 06/20/2017   Asymptomatic microscopic hematuria 06/20/2017   Atrial flutter ([place]) 03/29/2017   Coronary artery disease involving native coronary artery of native heart with angina pectoris ([place]) 03/29/2017   H/O maze procedure 03/29/2017   History of coronary artery bypass graft 03/29/2017   Hypertensive heart disease with heart failure ([place]) 03/29/2017   Hyperlipidemia 03/29/2017   Coronary arteriosclerosis in native artery 03/29/2017   Long term (current) use of anticoagulants 03/29/2017   Non-sustained ventricular tachycardia ([place]) 03/29/2017   Cough 10/17/2016   Pulmonary nodules 06/12/2016   Multiple nodules of lung 06/12/2016   COPD (chronic obstructive pulmonary disease) ([place]) 06/12/2016   Anxiety 05/10/2016   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   Allergic rhinitis 04/28/2016   [name] 03/20/2016   S/P AVR (aortic valve replacement) 03/20/2016   Dyspnea 02/01/2016   Syncope and collapse 02/01/2016   Lumbar radicular pain 01/19/2016   Lumbar radiculopathy 01/19/2016   Backache 12/14/2015   Essential hypertension 12/14/2015   Chronic midline back pain 12/14/2015   Chronic prostatitis 07/23/2015   Nephrolithiasis 07/23/2015   Chronic atrial fibrillation ([place]) 07/23/2015   PCP:  [name], PA-C Pharmacy:   [facility] [place], [address] AT [place] [address] [place] [address] Phone: [phone] Fax: [phone]     Social Determinants of Health (SDOH) Interventions    Readmission Risk Interventions     View : No data to display.

## 2022-05-18 NOTE — Plan of Care (Signed)

## 2022-05-18 NOTE — Progress Notes (Signed)
   05/18/22 0700  Provider Notification  Provider Name/Title Thelma Comp, MT  Date Provider Notified 05/18/22  Time Provider Notified 1903  Method of Notification Page  Notification Reason Critical result;Other (Comment) (BS 49-on BMP/ BS-49)  Test performed and critical result bs-49  Date Critical Result Received 05/18/22  Time Critical Result Received 0704  Provider response See new orders  Date of Provider Response 05/18/22  Time of Provider Response 0703   Pt with complaints of being shaky.  Pt groggy. Bs 49 on BMP and bs checked with glucometer and also was 49.  MD aware, new orders. Am nurse made aware and NT to check BS at 0730 for follow up blood sugar.

## 2022-05-18 NOTE — Progress Notes (Signed)
Inpatient Diabetes Program Recommendations  AACE/[name]: New Consensus Statement on Inpatient Glycemic Control (2015)  Target Ranges:  Prepandial:   less than 140 mg/dL      Peak postprandial:   less than 180 mg/dL (1-2 hours)      Critically ill patients:  140 - 180 mg/dL   Lab Results  Component Value Date   GLUCAP 161 (H) 05/18/2022   HGBA1C 5.5 01/24/2022    Latest Reference Range & Units 05/18/22 07:04 05/18/22 07:28  Glucose-Capillary 70 - 99 mg/dL 49 (L) 161 (H)  (L): Data is abnormally low (H): Data is abnormally high Review of Glycemic Control  Diabetes history: type 2 Outpatient Diabetes medications: Farxiga 10 mg daily Current orders for Inpatient glycemic control: none   Inpatient Diabetes Program Recommendations:   Noted that patient's blood sugars have been less than 70 mg/dl. Patient takes Farxiga 10 mg daily at home. This medication can cause dehydration and hypoglycemia which patient is experiencing on admission.   Re-evaluate patient taking Farxiga at home.   Harvel Ricks RN BSN CDE Diabetes Coordinator Pager: [phone]  8am-5pm

## 2022-05-18 NOTE — Consult Note (Signed)
CARDIOLOGY CONSULT NOTE       Patient ID: Lance Hicks MRN: [id number] DOB/AGE: 07-10-1946 76 y.o.  Admit date: 05/17/2022 Referring Physician: Reesa Chew Primary Physician: [name], PA-C Primary Cardiologist: [place] Reason for Consultation: CHF/AVR/Anticoagulation Management  Principal Problem:   Acute renal failure superimposed on stage 3a chronic kidney disease ([place]) Active Problems:   Coronary artery disease involving native coronary artery of native heart with angina pectoris ([place])   [name]   Pulmonary arterial hypertension (HCC)   Hypotension, chronic   Acute on chronic anemia   Chronic atrial fibrillation (HCC)   COPD (chronic obstructive pulmonary disease) ([place])   Chronic combined systolic and diastolic CHF (congestive heart failure) ([place])   Hyponatremia   HPI:  76 y.o. complicated cardiac patient followed closely by CHF clinic He has had multiple recent hospitalizations and is nearing hospice /palliative status. He is in chronic afib with mechanical AVR on chronic coumadin. He was transferred from [facility] for malaise , fatigue poor PO intake and possible GI bleed Hb 8.3. CABG/AVR in 2010 with 25 mm Carbomedics in 2010 normal function by TTE and had MAZE with LAA excision in [place]. He has significant COPD/ILD and pulmonary HTN on various meds for latter depending on how bad his chronic hypotension is   His last right heart cath done 09/2021 showed PA pressure of 79/20 mmHg and PCWP 21 with V wave to 30 mmHg He has chronic hyponatremia and has required Tolvaptan and milrinone Rx for volume overload in past His last TTE done 09/30/21 showed normal AVR mean gradient 9 mmHg EF 45-50% dilated RV with pulmonary HTN. Currently he just feels weak with abdominal pain and poor appetite   ROS All other systems reviewed and negative except as noted above  Past Medical History:  Diagnosis Date   Allergic rhinitis 04/28/2016   Last Assessment &  Plan:  Continue astelin   Anemia 07/01/2019   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   Last Assessment & Plan:  Stable in size on annual imaging. In light of concurrent left nephrolithiasis, will check CT renal colic next year instead of renal US.    Anticoagulated on Coumadin 01/04/2018   Anxiety 05/10/2016   Last Assessment & Plan:  Doing well off of zoloft.   Asthma    Asymptomatic microscopic hematuria 06/20/2017   Last Assessment & Plan:  Had hematuria workup in [facility] in 2016 which negative CT and cystoscopy. UA with 2+ blood last visit - we discussed recommendation for repeat workup at 5 years or if degree of hematuria progresses.    Atrial fibrillation ([place]) 03/29/2017   Atrial flutter ([place]) 03/29/2017   Backache 12/14/2015   Last Assessment & Plan:  Pain management referral for further evaluation.   Bilateral pleural effusion 08/09/2017   CHF (congestive heart failure) (HCC)    Chronic allergic rhinitis 04/28/2016   Last Assessment & Plan:  Continue astelin   Chronic anticoagulation 03/29/2017   Chronic atrial fibrillation ([place]) 07/23/2015   Last Assessment & Plan:  Coumadin and metoprolol, cardiology referral to establish care.   Chronic midline back pain 12/14/2015   Last Assessment & Plan:  Pain management referral for further evaluation.   Chronic obstructive lung disease ([place]) 06/12/2016   With hypoxia   Chronic prostatitis 07/23/2015   Last Assessment & Plan:  Has largely resolved since stopping bike riding. Recommend annual DRE AND PSA - will see back 12/2015 for annual screening, given 1st degree fhx. To call office for  recurrent prostatitis symptoms.    Chronic respiratory failure with hypoxia ([place]) 07/29/2019   Cirrhosis of liver not due to alcohol ([place]) 07/01/2019   COPD (chronic obstructive pulmonary disease) ([place]) 06/12/2016   Coronary arteriosclerosis in native artery 03/29/2017   Coronary artery disease involving native coronary artery of native heart with angina pectoris ([place]) 03/29/2017    Cough 10/17/2016   Last Assessment & Plan:  Discussed typical course for acute viral illness. If symptoms worsen or fail to improve by 7-10d, delayed ATBs, fluids, rest, NSAIDs/APAP prn. Seek care if not improving. Needs earlier INR check due to ATBs.   Dyspnea 02/01/2016   Last Assessment & Plan:  Overall improving, eval by pulm, plan for CT, neg stress test with cardiology. Recent switch to carvedilol due to side effects.   Encounter for therapeutic drug monitoring 01/06/2019   Enterococcal bacteremia    Epidermoid cyst of skin 08/24/2017   Essential hypertension 12/14/2015   Last Assessment & Plan:  Hypertension control: controlled  Medications: compliant Medication Management: as noted in orders Home blood pressure monitoring recommended additionally as needed for symptoms  The patient's care plan was reviewed and updated. Instructions and counseling were provided regarding patient goals and barriers. He was counseled to adopt a healthy lifestyle. Educational resources and self-management tools have been provided as charted in [facility] list.    H/O maze procedure 03/29/2017   [name] 03/29/2017   Overview:  2011   History of coronary artery bypass graft 03/29/2017   Hx of CABG 03/29/2017   Hyperlipidemia 03/29/2017   Hypersensitivity angiitis ([place]) 10/01/2017   Hypertensive heart disease 03/29/2017   Hypertensive heart disease with heart failure ([place]) 03/29/2017   Hypertensive heart failure ([place]) 03/29/2017   Hypokalemia due to excessive renal loss of potassium 02/18/2018   Hypotension, chronic 06/06/2018   International normalized ratio (INR) raised 07/26/2017   Kidney stone 07/23/2015   Kidney stones 07/23/2015   Overview:  x 3  Last Assessment & Plan:  By [place] has left nephrolithiasis, but not visible by KUB. Will check CT renal colic next year to assess both stone burden as well as to surveil AML.    Left ureteral stone 01/23/2018   Leukocytoclastic vasculitis ([place]) 10/01/2017    Localized edema 01/04/2018   Long term (current) use of anticoagulants 03/29/2017   Lumbar radicular pain 01/19/2016   Lumbar radiculopathy 01/19/2016   Maculopapular rash 09/03/2017   Microscopic hematuria 06/20/2017   Last Assessment & Plan:  Had hematuria workup in [facility] in 2016 which negative CT and cystoscopy. UA with 2+ blood last visit - we discussed recommendation for repeat workup at 5 years or if degree of hematuria progresses.    Multiple nodules of lung 06/12/2016   Nasal discharge 02/25/2016   Last Assessment & Plan:  Trial zyrtec and flonase   Nephrolithiasis 07/23/2015   Overview:  x 3  Last Assessment & Plan:  By [place] has left nephrolithiasis, but not visible by KUB. Will check CT renal colic next year to assess both stone burden as well as to surveil AML.   Overview:  x 3  Last Assessment & Plan:  Has 72m nonobstructing LUP stone - not visible by KUB.  Will check renal [place]/2019 - he will contact office sooner if symptomatic.    Non-sustained ventricular tachycardia ([place] 03/29/2017   Nonsustained ventricular tachycardia ([place] 03/29/2017   Other hyperlipidemia 03/29/2017   Palpitations 10/01/2017   Pleural effusion, bilateral 08/09/2017   Pneumonia due  to COVID-19 virus 02/07/2021   Post-nasal drainage 02/25/2016   Last Assessment & Plan:  Trial zyrtec and flonase   Prostate cancer screening 06/20/2017   Last Assessment & Plan:  Recommend continued annual CaP screening until within 10 years of life expectancy. Given good health and fhx of longevity, would anticipate CaP screening to continue until age 29.  PSA today and again in one year on day of visit.   Pulmonary arterial hypertension ([place]) 02/20/2018   Pulmonary edema    Pulmonary hypertension ([place]) 08/09/2017   Pulmonary nodules 06/12/2016   S/P AVR 03/20/2016   S/P AVR (aortic valve replacement) 03/20/2016   Strain of deltoid muscle, initial encounter    Supratherapeutic INR 07/26/2017   Syncope and collapse 02/01/2016   Typical atrial flutter ([place])  02/01/2016    Family History  Problem Relation Age of Onset   Asthma Mother    Arthritis Mother    Heart attack Father    Hypertension Father    Stroke Paternal Grandmother    Colon cancer Neg Hx     Social History   Socioeconomic History   Marital status: Single    Spouse name: Not on file   Number of children: Not on file   Years of education: Not on file   Highest education level: Not on file  Occupational History   Not on file  Tobacco Use   Smoking status: Former    Packs/day: 2.00    Years: 34.00    Pack years: 68.00    Types: Cigarettes    Quit date: 07/16/2000    Years since quitting: 21.8   Smokeless tobacco: Never  Vaping Use   Vaping Use: Never used  Substance and Sexual Activity   Alcohol use: Not Currently   Drug use: No   Sexual activity: Not on file  Other Topics Concern   Not on file  Social History Narrative   Not on file   Social Determinants of Health   Financial Resource Strain: Low Risk    Difficulty of Paying Living Expenses: Not very hard  Food Insecurity: No Food Insecurity   Worried About Running Out of Food in the Last Year: Never true   Ran Out of Food in the Last Year: Never true  Transportation Needs: No Transportation Needs   Lack of Transportation (Medical): No   Lack of Transportation (Non-Medical): No  Physical Activity: Not on file  Stress: Not on file  Social Connections: Not on file  Intimate Partner Violence: Not At Risk   Fear of Current or Ex-Partner: No   Emotionally Abused: No   Physically Abused: No   Sexually Abused: No    Past Surgical History:  Procedure Laterality Date   CHOLECYSTECTOMY     CORONARY ARTERY BYPASS GRAFT     EXPLORATORY LAPAROTOMY  07/30/2017   FOOT SURGERY     FRACTURE SURGERY Right    wrist and forearm   HERNIA REPAIR     MECHANICAL AORTIC VALVE REPLACEMENT     NASAL SINUS SURGERY     RIGHT HEART CATH N/A 01/18/2018   Procedure: RIGHT HEART CATH;  Surgeon: Jolaine Artist, MD;   Location: [facility];  Service: Cardiovascular;  Laterality: N/A;   RIGHT HEART CATH N/A 05/14/2018   Procedure: RIGHT HEART CATH;  Surgeon: Jolaine Artist, MD;  Location: [facility];  Service: Cardiovascular;  Laterality: N/A;   RIGHT HEART CATH N/A 09/29/2021   Procedure: RIGHT HEART CATH;  Surgeon: Jolaine Artist, MD;  Location: [facility];  Service: Cardiovascular;  Laterality: N/A;   TEE WITHOUT CARDIOVERSION N/A 05/21/2018   Procedure: TRANSESOPHAGEAL ECHOCARDIOGRAM (TEE);  Surgeon: Jolaine Artist, MD;  Location: [facility];  Service: Cardiovascular;  Laterality: N/A;   UPPER GASTROINTESTINAL ENDOSCOPY  07/12/2017   Patchy areas of mucosal inflammation noted in the antrum with edema,erthema and ulcerations. Bx. Chronicfocally active gastritis.   VASECTOMY        Current Facility-Administered Medications:    acetaminophen (TYLENOL) tablet 650 mg, 650 mg, Oral, Q6H PRN, 650 mg at 05/18/22 1116 **OR** acetaminophen (TYLENOL) suppository 650 mg, 650 mg, Rectal, Q6H PRN, Posey Pronto, Cleaster Corin, MD   busPIRone (BUSPAR) tablet 7.5 mg, 7.5 mg, Oral, BID, Posey Pronto, Vishal R, MD, 7.5 mg at 05/18/22 1117   dextrose 5 %-0.45 % sodium chloride infusion, , Intravenous, Continuous, Amin, Ankit Chirag, MD, Last Rate: 75 mL/hr at 05/18/22 0928, New Bag at 05/18/22 0928   docusate sodium (COLACE) capsule 100 mg, 100 mg, Oral, BID, Amin, Ankit Chirag, MD, 100 mg at 05/18/22 1116   famotidine (PEPCID) tablet 40 mg, 40 mg, Oral, BID, Zada Finders R, MD, 40 mg at 05/18/22 1116   ferumoxytol ([name]) 510 mg in sodium chloride 0.9 % 100 mL IVPB, 510 mg, Intravenous, Once, Amin, Ankit Chirag, MD   hydrALAZINE (APRESOLINE) injection 10 mg, 10 mg, Intravenous, Q4H PRN, Amin, Ankit Chirag, MD   ipratropium-albuterol (DUONEB) 0.5-2.5 (3) MG/3ML nebulizer solution 3 mL, 3 mL, Nebulization, Q4H PRN, Amin, Ankit Chirag, MD   macitentan (OPSUMIT) tablet 10 mg, 10 mg, Oral, Daily, Posey Pronto,  Vishal R, MD, 10 mg at 05/18/22 1157   metoprolol tartrate (LOPRESSOR) injection 5 mg, 5 mg, Intravenous, Q4H PRN, Amin, Ankit Chirag, MD   ondansetron (ZOFRAN) tablet 4 mg, 4 mg, Oral, Q6H PRN **OR** ondansetron (ZOFRAN) injection 4 mg, 4 mg, Intravenous, Q6H PRN, Posey Pronto, Vishal R, MD   pantoprazole (PROTONIX) injection 40 mg, 40 mg, Intravenous, Q12H, Vladimir Crofts, PA-C, 40 mg at 05/18/22 1117   polyethylene glycol (MIRALAX / GLYCOLAX) packet 17 g, 17 g, Oral, BID, Amin, Ankit Chirag, MD, 17 g at 05/18/22 1117   rosuvastatin (CRESTOR) tablet 5 mg, 5 mg, Oral, Daily, Posey Pronto, Vishal R, MD, 5 mg at 05/18/22 1116   Selexipag TABS 200 mcg, 200 mcg, Oral, BID, Patel, Vishal R, MD   senna-docusate (Senokot-S) tablet 1 tablet, 1 tablet, Oral, QHS PRN, Lenore Cordia, MD, 1 tablet at 05/17/22 2228   sodium chloride flush (NS) 0.9 % injection 3 mL, 3 mL, Intravenous, Q12H, Posey Pronto, Vishal R, MD, 3 mL at 05/17/22 2249   sucralfate (CARAFATE) tablet 1 g, 1 g, Oral, BID, Vladimir Crofts, PA-C, 1 g at 05/18/22 1116   tadalafil (CIALIS) tablet 20 mg, 20 mg, Oral, Daily, Zada Finders R, MD, 20 mg at 05/18/22 1157   traZODone (DESYREL) tablet 50 mg, 50 mg, Oral, QHS PRN, Amin, Ankit Chirag, MD  busPIRone  7.5 mg Oral BID   docusate sodium  100 mg Oral BID   famotidine  40 mg Oral BID   macitentan  10 mg Oral Daily   pantoprazole (PROTONIX) IV  40 mg Intravenous Q12H   polyethylene glycol  17 g Oral BID   rosuvastatin  5 mg Oral Daily   Selexipag  200 mcg Oral BID   sodium chloride flush  3 mL Intravenous Q12H   sucralfate  1 g Oral BID   tadalafil  20 mg Oral  Daily    dextrose 5 % and 0.45% NaCl 75 mL/hr at 05/18/22 0928   ferumoxytol      Physical Exam: Blood pressure (!) 90/49, pulse 62, temperature (!) 97.3 F (36.3 C), temperature source Oral, resp. rate 20, height _0  (1.753 m), weight 56.3 kg, SpO2 98 %.    Chronically ill white male  Likely cardiac cachexia Bruising  Interstitial  crackles at base SEM with crisp S2 click from AVR no AR Central scar in abdomen with no distension And diffuse pain to palpation Plus one edema with stasis changes   Labs:   Lab Results  Component Value Date   WBC 1.9 (L) 05/18/2022   HGB 8.3 (L) 05/18/2022   HCT 26.0 (L) 05/18/2022   MCV 76.0 (L) 05/18/2022   PLT 98 (L) 05/18/2022    Recent Labs  Lab 05/18/22 0614  NA 127*  K 3.3*  CL 90*  CO2 26  BUN 61*  CREATININE 1.72*  CALCIUM 9.5  PROT 7.2  BILITOT 3.3*  ALKPHOS 67  ALT 18  AST 29  GLUCOSE 49*   Lab Results  Component Value Date   CKTOTAL 114 06/17/2021    Lab Results  Component Value Date   CHOL 133 10/12/2020   CHOL 125 10/21/2018   Lab Results  Component Value Date   HDL 44 10/12/2020   HDL 43 10/21/2018   Lab Results  Component Value Date   LDLCALC 70 10/12/2020   [place] 71 10/21/2018   Lab Results  Component Value Date   TRIG 111 10/12/2020   TRIG 57 10/21/2018   Lab Results  Component Value Date   CHOLHDL 3.0 10/12/2020   CHOLHDL 2.9 10/21/2018   No results found for: LDLDIRECT    Radiology: DG Chest 2 View  Result Date: 05/16/2022 CLINICAL DATA:  CHF. EXAM: CHEST - 2 VIEW COMPARISON:  02/23/2022 FINDINGS: Lungs are adequately inflated without focal airspace consolidation. Findings suggest a small amount of right pleural fluid without significant change. Minimal hazy prominence of the perihilar markings suggesting a degree of vascular congestion without significant change. Mild stable cardiomegaly. Remainder of the exam is unchanged. IMPRESSION: Stable cardiomegaly with suggestion of minimal vascular congestion. Small amount right pleural fluid unchanged. Electronically Signed   By: Marin Olp M.D.   On: 05/16/2022 16:58   DG Abd 1 View  Result Date: 05/18/2022 CLINICAL DATA:  Abdominal pain and constipation EXAM: ABDOMEN - 1 VIEW COMPARISON:  CT scan 01/31/2022 FINDINGS: Epicardial pacer lead fragments noted along the  thoracoabdominal junction. Barium outlines diverticula in the colon, primarily the descending and sigmoid colon. Atherosclerosis is present, including aortoiliac atherosclerotic disease. No dilated bowel. 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 IMPRESSION: 1. Barium outlines diverticula primarily in the descending and sigmoid colon. No substantial colonic stool burden. 2.  Aortic Atherosclerosis (ICD10-I70.0). 3. Pars defects at L5 and substantial loss of disc height at L4-5 as shown on 01/31/2022. Electronically Signed   By: Van Clines M.D.   On: 05/18/2022 10:47   DG UGI W SMALL BOWEL DOUBLE CM  Result Date: 05/03/2022 CLINICAL DATA:  Patient with past medical history of dysphagia, recent nausea/vomiting, GERD, anemia, cirrhosis, CHF, COPD, pulmonary hypertension, coronary artery disease with prior CABG and mechanical AVR, chronic respiratory failure, aortic stenosis and atrial fibrillation. History of EGD in 2018 with gastritis. EXAM: DG UGI W/ SMALL BOWEL TECHNIQUE: Scout radiograph was obtained. Combined double and single contrast examination was performed using  effervescent crystals, high-density barium and thin liquid barium. This exam was performed by APP name, and was supervised and interpreted by Rad name. FLUOROSCOPY: Radiation Exposure Index (as provided by the fluoroscopic device): 70.10 mGy Kerma COMPARISON:  CT abdomen/pelvis 01/31/22 FINDINGS: Scout Radiograph: Normal bowel gas pattern. Retained epicardial pacing wires and previous median sternotomy noted. Aortic and branch vessel atherosclerosis. Esophagus: Normal appearance. Esophageal motility: Within normal limits for age. A single episode of aspiration into the trachea was noted. Esophageal reflux: No significant reflux elicited by provocative maneuvers Ingested 39m barium tablet: Passed normally Stomach: Mild diffuse rugal thickening without ulceration or focal mucosal abnormality. No significant hiatal  hernia. Gastric emptying: Normal. Duodenum: Normal appearance. Other: Normal small bowel transit time. No small bowel wall thickening or distention identified. At subsequent fluoroscopy, no abnormality of the terminal ileum identified. IMPRESSION: 1. Single episode of silent aspiration noted. 2. Normal esophageal motility without focal mucosal abnormality. 3. Mild rugal fold thickening in the stomach as can be seen with gastritis. No evidence of ulceration. 4. The small bowel appears normal. Electronically Signed   By: WRichardean SaleM.D.   On: 05/03/2022 13:38    EKG: Afib PVC IVCD no acute changes    ASSESSMENT AND PLAN:   CHF:  he is dry ok to hydrate in setting of poor PO intake and GI bleeding EF 45-50% with RV failure and azotemiaHold diuretics He is not on ARB/ARNI due to CKD AVR:  normal exam and echo 10/22 He has not taken it since Sunday but INR Rx today at 2.2 ok to continue to hold until Hb stable or GI decides they are not going to do EGD/Colon Afib:  chronic rate control fine Has had MAZE and LAA clipping continue lopressor Pulmonary HTN:  WHO class 2/3 continue Tadalafil, Selexipag and opsumit as tolerated  Anemia:  [place] no ileus TBili elevated 3.3 plan per primary service and GI He is fairly tender to touch CRF:  diuretics held Cr 1.72  Signed: PJenkins Rouge5/25/2023, 1:00 PM

## 2022-05-18 NOTE — Consult Note (Addendum)
Consultation  Referring Provider:   ER Primary Care Physician:  [name], PA-C Primary Gastroenterologist:  Dr. Lyndel Safe       Reason for Consultation:     GI bleed, patient coming from [facility] for weakness   Impression    Acute on chronic anemia with reports melena in setting of chronic coumadin due to mechanical AVR with cardiac cirrhosis, PHTN , COPD, CKD stage 3. FOBT positive chronically, elevated BUN without hemodynamic compromise, patient has chronic hypotension. In the setting of Coumadin due to mechanical AVR without overt bleeding but FOBT positive CBC on 05/18/2022   WBC 1.9 HGB 8.3 MCV 76.0 Platelets 98 Anemia studies on 05/16/2022  Iron 24 Ferritin 38 B12 >1504 06/2017 EGD at [facility] [place] -report reviewed, mild gastritis with neg Bx. No varices 05/03/2022 UGI with small bowel follow-through -Single episode of silent aspiration -Normal esophageal motility without focal mucosal abnormality -Mild rugal fold thickening in the stomach which can be seen with gastritis no evidence of ulceration. -Small bowel appears normal.  Constipation AB pain, no BM x Monday, states not passing gas x 3 days  Cardiac Cirrhosis HGB 8.3 Platelets 98 AST 29 ALT 18  Alkphos 67 TBili 3.3 GFR 41  INR 05/18/2022 2.2  MELD-Na score: 30 at 05/18/2022  6:14 AM MELD score: 25 at 05/18/2022  6:14 AM Calculated from: Serum Creatinine: 1.72 mg/dL at 05/18/2022  6:14 AM Serum Sodium: 127 mmol/L at 05/18/2022  6:14 AM Total Bilirubin: 3.3 mg/dL at 05/18/2022  6:14 AM INR(ratio): 2.2 at 05/18/2022  6:14 AM Age: 76 years  Acute renal failure superimposed on stage 3a chronic kidney disease (HCC) BUN 61 Cr 1.72  GFR 41  Potassium 3.3  Magnesium 2.2   [name] INR 05/18/2022 2.2   Pulmonary arterial hypertension (HCC) Secondary to COPD On Selexipag  Coronary artery disease involving native coronary artery of native heart with angina pectoris  [facility]) S/p CABG   Chronic atrial fibrillation (HCC) On coumadin  Chronic combined systolic and diastolic CHF (congestive heart failure) ([place])   Last EF 45-50% with G2 DD on TTE 09/30/2021.  Hyponatremia Improving slowly    Plan   Acute on chronic anemia with reports melena in setting of chronic coumadin due to mechanical AVR with cardiac cirrhosis, PHTN , COPD, CKD stage 3. FOBT positive chronically, elevated BUN Patient very high risk for endoscopic evaluation, no bowel movement for several days which is reassuring for no active bleeding. Had recent upper GI 05/03/2022 which showed no obvious cancers, ulcers but did show likely gastritis. Multiple AB surgeries, constipation with last BM being Monday, states has not passed gas for 2 days per patient  -check KUB to rule out obstruction -Add on MiraLAX once or twice daily as needed, enema -PPI IV twice daily- has protonix listed as "allergy" with itching and gassiness- no other IV forms discussed with patient no rashes, no trouble breathing/swallowing, patient is willing to do IV pantoprazole and will add on Cafetrate 1 g BID -Clear liquid diet and NPO tomorrow -Consult cardiology for medical clearance and help with coumadin management for possible EGD if patient starts to have signs of active bleeding or transfusion dependent anemia  but at this time patient's very high risk with recent upper GI showing gastritis, no evidence of acute GI bleeding would highly recommend conservative management with transfusions and PPI IV.  - Complicated by chronic coumadin use due to AVR and high risk to be off  medication, will discuss with Dr. Henrene Pastor and suggest cardiology consult.  -Ultimate timing of endoscopic procedure pending recommendation by Dr. Henrene Pastor, cardiology consult. - monitor serial H/H - daily CBC, INR, CMET  Thank you for your kind consultation, we will continue to follow.         HPI:   Lance Hicks is a 76 y.o. male with past  medical history significant for coronary disease and aortic stenosis status post bypass mechanical aortic valve replacement 2010 on chronic Coumadin, atrial fibrillation with unsuccessful maze and LAA excision at [facility] in [place], heart failure, advanced COPD, hyperlipidemia, hypertension, CKD stage III. Liver cirrhosis likely cardiac liver cirrhosis with congestive hepatomegaly no history of alcohol use no portal hypertension on CT 01/2022.  Small ascites likely related to cor pulmonale and pulmonary hypertension. History of gallstone pancreatitis 07/2017 underwent lap chole 2018.  Had subsequent open laparotomy for postoperative bleeding.  Unclear omental source had 8 unit packed red blood cells.  04/14/2022 office visit with Dr. Lyndel Safe for IDA with FOBT positive stools on Coumadin stable hemoglobin, per cardiology not a candidate for elective endoscopic procedures due to frail pulmonary status and complicated cardiac history.    2015 colonoscopy [place] negative had Lovenox bridge prior to colonoscopy. 06/2017 at [facility], [place] -report reviewed, mild gastritis with neg Bx. No varices 01/2021 CT abdomen pelvis without contrast -Negative for any acute abnormalities -Nonobstructing renal stones -Colonic diverticulosis -Right middle lobe lung nodule -Liver looked normal.  No portal hypertension. 05/03/2022 UGI with small bowel follow-through -Single episode of silent aspiration -Normal esophageal motility without focal mucosal abnormality -Mild rugal fold thickening in the stomach which can be seen with gastritis no evidence of ulceration. -Small bowel appears normal.  Patient presented to [facility] 05/17/2022 with acute on chronic anemia and generalized weakness. Patient reports nausea and GERD symptoms denies emesis. Reported decreased urine output. In the ER patient had hemoglobin 8.9, previous day 9.4, prior to that baseline between 11 and 13.  Platelets 112, iron 24,  sodium 125, creatinine 2.38 baseline 1.5-1.6,  FOBT positive. Coumadin currently on hold, INR this morning 2.2 Has been unable to tolerate omeprazole, pantoprazole in the past did well with Aciphex. Patient on Aldactone 25 mg daily, torsemide 20 mg 2 times daily Patient is on turmeric.   Patient states been having 2-3 bowel movements daily for several weeks can be very dark black at times.  Has been having constipation since Sunday, states this happen if he becomes dehydrated from his diuretics.   Patient states he has not been passing gas for 2 days. Patient's not on Pepto-Bismol or iron currently. Patient's been having worsening reflux unknown which medications he is on at home will have her friend bring it.  Notes he is on Pepcid Complete twice daily. Patient's been having nausea and spitting up with cough but no vomiting. Patient's been having cough with shortness of breath but no chest pain.  Denies leg swelling and abdominal swelling. Denies NSAID use, alcohol use.  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 In light of concurrent left nephrolithiasis, will check CT renal colic next year instead of renal US.    Anticoagulated on Coumadin 01/04/2018   Anxiety 05/10/2016   Last Assessment & Plan:  Doing well off of zoloft.  Asthma    Asymptomatic microscopic hematuria 06/20/2017   Last Assessment & Plan:  Had hematuria workup in [facility] in 2016 which negative CT and cystoscopy. UA with 2+ blood last visit - we discussed recommendation for repeat workup at 5 years or if degree of hematuria progresses.    Atrial fibrillation ([place]) 03/29/2017   Atrial flutter ([place]) 03/29/2017   Backache 12/14/2015   Last Assessment & Plan:  Pain management referral for further evaluation.   Bilateral pleural effusion  08/09/2017   CHF (congestive heart failure) (HCC)    Chronic allergic rhinitis 04/28/2016   Last Assessment & Plan:  Continue astelin   Chronic anticoagulation 03/29/2017   Chronic atrial fibrillation ([place]) 07/23/2015   Last Assessment & Plan:  Coumadin and metoprolol, cardiology referral to establish care.   Chronic midline back pain 12/14/2015   Last Assessment & Plan:  Pain management referral for further evaluation.   Chronic obstructive lung disease ([place]) 06/12/2016   With hypoxia   Chronic prostatitis 07/23/2015   Last Assessment & Plan:  Has largely resolved since stopping bike riding. Recommend annual DRE AND PSA - will see back 12/2015 for annual screening, given 1st degree fhx. To call office for recurrent prostatitis symptoms.    Chronic respiratory failure with hypoxia ([place]) 07/29/2019   Cirrhosis of liver not due to alcohol ([place]) 07/01/2019   COPD (chronic obstructive pulmonary disease) ([place]) 06/12/2016   Coronary arteriosclerosis in native artery 03/29/2017   Coronary artery disease involving native coronary artery of native heart with angina pectoris ([place]) 03/29/2017   Cough 10/17/2016   Last Assessment & Plan:  Discussed typical course for acute viral illness. If symptoms worsen or fail to improve by 7-10d, delayed ATBs, fluids, rest, NSAIDs/APAP prn. Seek care if not improving. Needs earlier INR check due to ATBs.   Dyspnea 02/01/2016   Last Assessment & Plan:  Overall improving, eval by pulm, plan for CT, neg stress test with cardiology. Recent switch to carvedilol due to side effects.   Encounter for therapeutic drug monitoring 01/06/2019   Enterococcal bacteremia    Epidermoid cyst of skin 08/24/2017   Essential hypertension 12/14/2015   Last Assessment & Plan:  Hypertension control: controlled  Medications: compliant Medication Management: as noted in orders Home blood pressure monitoring recommended additionally as needed for symptoms  The patient's care plan was reviewed and updated.  Instructions and counseling were provided regarding patient goals and barriers. He was counseled to adopt a healthy lifestyle. Educational resources and self-management tools have been provided as charted in [facility] list.    H/O maze procedure 03/29/2017   [name] 03/29/2017   Overview:  2011   History of coronary artery bypass graft 03/29/2017   Hx of CABG 03/29/2017   Hyperlipidemia 03/29/2017   Hypersensitivity angiitis ([place]) 10/01/2017   Hypertensive heart disease 03/29/2017   Hypertensive heart disease with heart failure ([place]) 03/29/2017   Hypertensive heart failure ([place]) 03/29/2017   Hypokalemia due to excessive renal loss of potassium 02/18/2018   Hypotension, chronic 06/06/2018   International normalized ratio (INR) raised 07/26/2017   Kidney stone 07/23/2015   Kidney stones 07/23/2015   Overview:  x 3  Last Assessment & Plan:  By [place] has left nephrolithiasis, but not visible by KUB. Will check CT renal colic next year to assess both stone burden as well as to surveil AML.    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 UA with 2+ blood last visit - we discussed recommendation for repeat workup at 5 years or if degree of hematuria progresses.    Multiple nodules of lung 06/12/2016   Nasal discharge 02/25/2016   Last Assessment & Plan:  Trial zyrtec and flonase   Nephrolithiasis 07/23/2015   Overview:  x 3  Last Assessment & Plan:  By [place] has left nephrolithiasis, but not visible by KUB. Will check CT renal colic next year to assess both stone burden as well as to surveil AML.   Overview:  x 3  Last Assessment & Plan:  Has 21m nonobstructing LUP  stone - not visible by KUB.  Will check renal [place]2019 - he will contact office sooner if symptomatic.    [place]sustained [place] tachycardia ([place] 03/29/2017   Nonsustained ventricular tachycardia ([place] 03/29/2017   Other hyperlipidemia 03/29/2017   Palpitations 10/01/2017   Pleural effusion, bilateral 08/09/2017   Pneumonia due to COVID-19 virus 02/07/2021   Post-nasal drainage 02/25/2016   Last Assessment & Plan:  Trial zyrtec and flonase   Prostate cancer screening 06/20/2017   Last Assessment & Plan:  Recommend continued annual CaP screening until within 10 years of life expectancy. Given good health and fhx of longevity, would anticipate CaP screening to continue until age 76  PSA today and again in one year on day of visit.   Pulmonary arterial hypertension ([place] 02/20/2018   Pulmonary edema    Pulmonary hypertension ([place] 08/09/2017   Pulmonary nodules 06/12/2016   S/P AVR 03/20/2016   S/P AVR (aortic valve replacement) 03/20/2016   Strain of deltoid muscle, initial encounter    Supratherapeutic INR 07/26/2017   Syncope and collapse 02/01/2016   Typical atrial flutter ([place] 02/01/2016    Surgical History:  He  has a past surgical history that includes Hernia repair; Vasectomy; Coronary artery bypass graft; Mechanical aortic valve replacement; Foot surgery; Fracture surgery (Right); Cholecystectomy; Nasal sinus surgery; Exploratory laparotomy (07/30/2017); RIGHT HEART CATH (N/A, 01/18/2018); RIGHT HEART CATH (N/A, 05/14/2018); TEE without cardioversion (N/A, 05/21/2018); Upper gastrointestinal endoscopy (07/12/2017); and RIGHT HEART CATH (N/A, 09/29/2021). Family History:  His family history includes Arthritis in his mother; Asthma in his mother; Heart attack in his father; Hypertension in his father; Stroke in his paternal grandmother. Social History:   reports that he quit smoking about 21 years ago. His smoking use included cigarettes. He has a 68.00 pack-year smoking history. He has never used  smokeless tobacco. He reports that he does not currently use alcohol. He reports that he does not use drugs.  Prior to Admission medications   Medication Sig Start Date End Date Taking? Authorizing Provider  acetaminophen (TYLENOL) 500 MG tablet Take 1,000 mg by mouth every 6 (six) hours as needed for moderate pain. 02/12/22  Yes [provider]  albuterol (VENTOLIN HFA) 108 (90 Base) MCG/ACT inhaler INHALE 2 PUFFS INTO THE LUNGS EVERY 6 HOURS AS NEEDED FOR WHEEZING OR SHORTNESS OF BREATH 04/21/22  Yes Byrum, RRose Fillers MD  ascorbic acid (VITAMIN C) 500 MG tablet Take 500 mg by mouth daily. 02/12/22  Yes [provider]  aspirin EC 81 MG tablet Take 81 mg by mouth daily. Swallow whole.   Yes [provider]  busPIRone (BUSPAR) 7.5 MG tablet Take 1 tablet (7.5 mg total) by mouth 2 (two) times daily. 02/17/22  Yes Saguier, Iris Pert  Cholecalciferol 50 MCG (2000 UT) CHEW Chew 1 tablet by mouth daily. 02/12/22  Yes [provider]  Coenzyme Q10 100 MG capsule Take 100 mg by mouth daily. 02/12/22  Yes [provider]  dapagliflozin propanediol (FARXIGA) 10 MG TABS tablet Take 1 tablet (10 mg total) by mouth daily before breakfast. 10/14/21  Yes Milford, Maricela Bo, FNP  dexlansoprazole (DEXILANT) 60 MG capsule Take 1 capsule (60 mg total) by mouth daily. 05/09/22  Yes Jackquline Denmark, MD  famotidine (PEPCID) 40 MG tablet Take 1 tablet (40 mg total) by mouth 2 (two) times daily. 05/11/22  Yes Jackquline Denmark, MD  ferrous sulfate 325 (65 FE) MG EC tablet Take 325 mg by mouth every other day.   Yes [provider]  KRILL OIL PO Take 800 mg by mouth every other day. In the morning   Yes [provider]  midodrine (PROAMATINE) 5 MG tablet Take 5 mg by mouth 3 (three) times daily with meals.   Yes [provider]  ondansetron (ZOFRAN) 8 MG tablet Take 1 tablet (8 mg total) by mouth every 8 (eight) hours as needed for nausea or vomiting. 12/20/21   Yes Marrian Salvage, FNP  OPSUMIT 10 MG tablet Take 10 mg by mouth daily. 03/14/22  Yes [provider]  potassium chloride (KLOR-CON M) 10 MEQ tablet Take 40 mEq by mouth 3 (three) times daily. 03/11/22  Yes [provider]  RABEprazole (ACIPHEX) 20 MG tablet Take 1 tablet (20 mg total) by mouth daily. 02/21/22  Yes Jackquline Denmark, MD  REFRESH TEARS 0.5 % SOLN Place 1 drop into both eyes daily as needed (dry eyes). 02/08/22  Yes [provider]  Selexipag 200 MCG TABS Take 1 tablet (200 mcg total) by mouth 2 (two) times daily. Patient taking differently: Take 400-800 mcg by mouth 2 (two) times daily. Take 4 tablets (800 mcg) in the morning & Take 2 tablets (400 mcg) at bedtime 02/04/22  Yes Arrien, Jimmy Picket, MD  spironolactone (ALDACTONE) 25 MG tablet Take 25 mg by mouth daily.   Yes [provider]  tadalafil (CIALIS) 20 MG tablet Take 20 mg by mouth daily as needed for erectile dysfunction. 02/06/22  Yes [provider]  torsemide (DEMADEX) 20 MG tablet Take 40-60 mg by mouth as directed. Take 3 tablets (60 mg) in the morning & Take 2 tablets (40 mg) in the evening   Yes [provider]  Turmeric 500 MG TABS Take 1 tablet by mouth daily in the afternoon.   Yes [provider]  vitamin B-12 (CYANOCOBALAMIN) 1000 MCG tablet Take 1,000 mcg by mouth daily at 12 noon.   Yes [provider]  warfarin (COUMADIN) 5 MG tablet Take 2.5-5 mg by mouth as directed. Take 1 tablet (5 mg) on (Mon, Wed, Sat) & Take 1/2 tablet (2.5 mg) all other days   Yes [provider]  Zinc 50 MG TABS Take 50 mg by mouth every other day. In the afternoon   Yes [provider]  ferrous sulfate 325 (65 FE) MG tablet Take 1 tablet (325 mg total) by mouth every other day. 01/31/22 05/01/22  [place] Donnamarie Poag, DO  rosuvastatin (CRESTOR) 5 MG tablet Take 1 tablet (5 mg total) by mouth daily. 01/31/22 05/01/22  [place] Eric J, DO   [place]  01/21/22   [provider]  spironolactone (ALDACTONE) 25 MG tablet Take 1 tablet (25 mg total) by mouth daily. 02/04/22 05/05/22  Arrien, Jimmy Picket, MD  tiZANidine (ZANAFLEX) 2 MG tablet Take 1 tablet (2 mg total) by mouth every 8 (eight) hours as needed for muscle spasms. Patient not taking: Reported on 05/17/2022 04/06/22   Mcarthur Rossetti, MD  torsemide (DEMADEX) 20 MG tablet Take 1 tablet (20 mg total) by mouth daily. Patient taking differently: Take 40 mg by mouth 2 (two) times daily. 02/04/22 05/05/22  Arrien, Jimmy Picket, MD  warfarin (COUMADIN) 5 MG tablet See admin instructions. Take a full tablet 2 days a week and 1/2 tablet 5 days a week. 01/31/22 05/01/22  [place] Eric J, DO    Current Facility-Administered Medications  Medication Dose Route Frequency Provider Last Rate Last Admin   acetaminophen (TYLENOL) tablet 650 mg  650 mg Oral Q6H PRN Lenore Cordia, MD       Or   acetaminophen (TYLENOL) suppository 650 mg  650 mg Rectal Q6H PRN Lenore Cordia, MD       busPIRone (BUSPAR) tablet 7.5 mg  7.5 mg Oral BID Posey Pronto, Vishal R, MD       dextrose 5 %-0.45 % sodium chloride infusion   Intravenous Continuous Amin, Ankit Chirag, MD       famotidine (PEPCID) tablet 40 mg  40 mg Oral BID Lenore Cordia, MD   40 mg at 05/17/22 2227   hydrALAZINE (APRESOLINE) injection 10 mg  10 mg Intravenous Q4H PRN Amin, Ankit Chirag, MD       ipratropium-albuterol (DUONEB) 0.5-2.5 (3) MG/3ML nebulizer solution 3 mL  3 mL Nebulization Q4H PRN Amin, Ankit Chirag, MD       macitentan (OPSUMIT) tablet 10 mg  10 mg Oral Daily Zada Finders R, MD       metoprolol tartrate (LOPRESSOR) injection 5 mg  5 mg Intravenous Q4H PRN Amin, Ankit Chirag, MD       ondansetron (ZOFRAN) tablet 4 mg  4 mg Oral Q6H PRN Lenore Cordia, MD       Or   ondansetron (ZOFRAN) injection 4 mg  4 mg Intravenous Q6H PRN Zada Finders R, MD        rosuvastatin (CRESTOR) tablet 5 mg  5 mg Oral Daily Patel, Vishal R, MD       Selexipag TABS 200 mcg  200 mcg Oral BID Lenore Cordia, MD       senna-docusate (Senokot-S) tablet 1 tablet  1 tablet Oral QHS PRN Lenore Cordia, MD   1 tablet at 05/17/22 2228   sodium chloride flush (NS) 0.9 % injection 3 mL  3 mL Intravenous Q12H Zada Finders R, MD   3 mL at 05/17/22 2249   tadalafil (CIALIS) tablet 20 mg  20 mg Oral Daily Zada Finders R, MD       traZODone (DESYREL) tablet 50 mg  50 mg Oral QHS PRN Damita Lack, MD        Allergies as of 05/17/2022 - Review Complete 05/17/2022  Allergen Reaction Noted   Augmentin [amoxicillin-pot clavulanate] Anaphylaxis and Diarrhea 07/02/2017   Midodrine  02/12/2022   Amoxicillin  02/12/2022   Chicken allergy Other (See Comments) 01/29/2022   Clavulanic acid  02/12/2022   Pantoprazole sodium Nausea Only 05/21/2019   Tizanidine  02/12/2022   Valium [diazepam] Other (See Comments) 08/23/2021   Tape Rash and Other (See Comments) 07/23/2015   Wound dressing  adhesive Other (See Comments) and Rash 07/23/2015    Review of Systems:    Constitutional: No weight loss, fever, chills, weakness or fatigue HEENT: Eyes: No change in vision               Ears, Nose, Throat:  No change in hearing or congestion Skin: No rash or itching Cardiovascular: No chest pain, chest pressure or palpitations   Respiratory: No SOB or cough Gastrointestinal: See HPI and otherwise negative Genitourinary: No dysuria or change in urinary frequency Neurological: No headache, dizziness or syncope Musculoskeletal: No new muscle or joint pain Hematologic: No bleeding or bruising Psychiatric: No history of depression or anxiety     Physical Exam:  Vital signs in last 24 hours: Temp:  [94.6 F (34.8 C)-98.5 F (36.9 C)] 97.3 F (36.3 C) (05/25 0735) Pulse Rate:  [58-66] 62 (05/25 0735) Resp:  [16-20] 20 (05/25 0735) BP: (85-107)/(49-70) 90/49 (05/25 0735) SpO2:  [97  %-100 %] 98 % (05/25 0735) Weight:  [52 kg-56.3 kg] 56.3 kg (05/25 0500) Last BM Date : 05/14/22 Last BM recorded by nurses in past 5 days No data recorded  General:   Chronically ill-appearing male, thin Head:  Normocephalic and atraumatic. Eyes: sclerae anicteric,conjunctive pink  Heart: Irregular irregular rhythm, normal rate, systolic murmur with prominent systolic click Pulm: Barrel chest, decreased breath sounds diffusely Abdomen: Slightly tense, nondistended AB, Sluggish bowel sounds. moderate tenderness in the epigastrium and in the RLQ. With guarding and Without rebound, well-healed surgical scar from umbilicus to sternum. Extremities:  Without edema. Msk:  Symmetrical without gross deformities. Peripheral pulses intact.  Neurologic:  Alert and  oriented x4;  No focal deficits.  Skin: Diffuse ecchymoses on bilateral arms, scalp dry and intact without significant lesions or rashes. Psychiatric:  Cooperative. Normal mood and affect.  LAB RESULTS: Recent Labs    05/16/22 1650 05/17/22 1959 05/18/22 0614  WBC 3.9 2.1* 1.9*  HGB 9.4* 8.6* 8.3*  HCT 29.1* 27.0* 26.0*  PLT 84* 107* 98*   BMET Recent Labs    05/16/22 1650 05/17/22 1959 05/18/22 0614  NA 125* 126* 127*  K 4.2 3.6 3.3*  CL 84* 86* 90*  CO2 _0 GLUCOSE 71 79 49*  BUN 63* 64* 61*  CREATININE 2.38* 1.86* 1.72*  CALCIUM 10.0 9.6 9.5   LFT Recent Labs    05/18/22 0614  PROT 7.2  ALBUMIN 3.2*  AST 29  ALT 18  ALKPHOS 67  BILITOT 3.3*   PT/INR Recent Labs    05/17/22 1959 05/18/22 0614  LABPROT 23.7* 23.9*  INR 2.1* 2.2*    STUDIES: DG Chest 2 View  Result Date: 05/16/2022 CLINICAL DATA:  CHF. EXAM: CHEST - 2 VIEW COMPARISON:  02/23/2022 FINDINGS: Lungs are adequately inflated without focal airspace consolidation. Findings suggest a small amount of right pleural fluid without significant change. Minimal hazy prominence of the perihilar markings suggesting a degree of vascular congestion  without significant change. Mild stable cardiomegaly. Remainder of the exam is unchanged. IMPRESSION: Stable cardiomegaly with suggestion of minimal vascular congestion. Small amount right pleural fluid unchanged. Electronically Signed   By: Marin Olp M.D.   On: 05/16/2022 16:58     Vladimir Crofts  05/18/2022, 8:25 AM  GI ATTENDING  History, laboratories, x-rays, and ancillary studies reviewed.  Patient seen and examined.  Agree with comprehensive consultation note as outlined above.  IMPRESSION: 1.  Chronic anemia.  Multifactorial including chronic disease (renal) and chronic blood loss.  Iron  deficiency noted.  Slight drift in hemoglobin from baseline as noted. 2.  Hemoccult positive stool without overt bleeding.  May be related to "gastritis" seen on upper GI series.  Could have AVMs, certainly. 3.  Unremarkable colonoscopy elsewhere within the past 10 years and recent upper GI series with questionable gastritis but no other pathologic lesions.  Unremarkable abdominal CT 1 year ago. 4.  Chronic anticoagulation 5.  Multiple significant medical problems.  Too high risk for elective endoscopic evaluations.  RECOMMENDATIONS: 1.  Transfuse as clinically appropriate 2.  Iron replacement therapy.  He should be on this chronically 3.  Daily PPI .  This for upper GI mucosal protection.  I would recommend that he stay on this once daily indefinitely. 4.  Keep INR in therapeutic range 5.  His primary provider should monitor blood counts and iron studies periodically to make sure that his iron levels are adequate and provide transfusion as clinically indicated to possibly reduce the need for hospitalization 6.  No further plans from GI perspective.  We are available for questions or problems.  We will sign off.  Docia Chuck. Geri Seminole., M.D. [facility] Division of Gastroenterology

## 2022-05-18 NOTE — Progress Notes (Addendum)
Pharmacy Consult for Pulmonary Hypertension Treatment   Indication - Continuation of prior to admission medication   Patient is 76 y.o. male with history of PAH on chronic Macitentan (Opsumit) PTA and will be continued while hospitalized.   Continuing this medication order as an inpatient requires that monitoring parameters per REMS requirements must be met.  Chronic therapy is under the supervision of Dr. Haroldine Laws who is enrolled in the REMS program and is being notified of continuation of therapy. A staff message in EPIC has been sent notifying the certified prescriber.  Per patient report has previously been educated on Hepatotoxicity .  Hepatic function has been evaluated. AST/ALT appropriate to continue medication at this time.     Latest Ref Rng & Units 05/18/2022    6:14 AM 05/17/2022    7:59 PM 05/16/2022    4:50 PM  Hepatic Function  Total Protein 6.5 - 8.1 g/dL 7.2   7.8   8.2    Albumin 3.5 - 5.0 g/dL 3.2   3.5     AST 15 - 41 U/L 29   33   28    ALT 0 - 44 U/L _0 Alk Phosphatase 38 - 126 U/L 67   74     Total Bilirubin 0.3 - 1.2 mg/dL 3.3   3.6   3.8      If any question arises during hospitalization, contact for bosentan and macitentan: [phone]; ambrisentan: [phone].  Thank for you allowing [place] to participate in the care of this patient.    Lindell Spar, PharmD, BCPS 05/18/2022 3:24 PM

## 2022-05-18 NOTE — Progress Notes (Signed)
PROGRESS NOTE    Lance Hicks  WCH:[id number] DOB: 1946-03-07 DOA: 05/17/2022 PCP: [name], PA-C   Brief Narrative:  76 y.o. male with medical history significant for chronic combined systolic and diastolic CHF (EF 35-36 percent, G2DD), CAD s/p CABG, chronic atrial fibrillation and aortic stenosis s/p mechanical aortic valve replacement on Coumadin, pulmonary arterial hypertension, COPD, CKD stage IIIa, chronic hypotension, hepatic cirrhosis, anemia of chronic disease and iron deficiency who was admitted from [facility] for evaluation of generalized weakness associated with acute on chronic anemia.  Patient was then seen by her PCP on 5/23 where they obtained blood work, that showed hemoglobin of 8.9 and was Hemoccult positive.  Patient was eventually transferred to [facility] long. [facility] was consulted too.    Assessment & Plan:  Principal Problem:   Acute renal failure superimposed on stage 3a chronic kidney disease (HCC) Active Problems:   Hyponatremia   Acute on chronic anemia   Coronary artery disease involving native coronary artery of native heart with angina pectoris (HCC)   [name]   Pulmonary arterial hypertension (HCC)   Hypotension, chronic   Chronic atrial fibrillation (HCC)   COPD (chronic obstructive pulmonary disease) (HCC)   Chronic combined systolic and diastolic CHF (congestive heart failure) (HCC)     Assessment and Plan: * Acute renal failure superimposed on stage 3a chronic kidney disease (HCC) Improving, creatinine today 1.7.  Creatinine 2.38 prior to arrival with baseline 1.5-1.6.  Appears volume depleted on admission.  Currently getting gentle hydration. -Hold spironolactone, metformin, Farxiga -Monitor UOP  Hyponatremia Admission sodium 125, slowly improving with hydration.  Acute on chronic anemia. Microcytic Severe iron deficiency -Baseline hemoglobin 12, admission hemoglobin 9.0.  FOBT positive.  BUN  is elevated.  [facility] has been consulted. Pepcid BID. Allergy to PPI.  IV iron ordered. -Hold Coumadin for now but low threshold to restart given mechanical AVR and A-fib  Chronic combined systolic and diastolic CHF (congestive heart failure) (HCC) Appears hypovolemic on admission.  Last EF 45-50% with G2 DD on TTE 09/30/2021. -On gentle IV fluids as above.  Consulted cardiology for their input and comanagement. -Holding spironolactone and Farxiga with AKI  COPD (chronic obstructive pulmonary disease) ([place]) Stable without wheezing or dyspnea on admission.  Continue albuterol as needed.  Chronic atrial fibrillation (HCC) Remains in atrial fibrillation with controlled rate.  Not on rate controlling agents due to history of bradycardia.  Holding Coumadin for now as above.  Hypotension, chronic BP currently stable.  Has been on midodrine in the past.  Pulmonary arterial hypertension (HCC) Continue home selexipag, macitentan, tadalafil.  [name] Coumadin on hold for now as above.  Coronary artery disease involving native coronary artery of native heart with angina pectoris [facility]) S/p CABG.  Denies any chest pain.  Continue statin.    DVT prophylaxis: SCDs Start: 05/17/22 1931 Code Status: Full Code Family Communication: None at bedside  Status is: Inpatient Remains inpatient appropriate because: GI eval for bleeding.       Subjective:  Patient seen and examined at bedside.  Reporting of abdominal discomfort from not having bowel movement for several days.  Denies any nausea or vomiting.  Tells me he has significant dry mouth.   Examination:  General exam: Appears calm and comfortable, elderly frail with bilateral temporal wasting. Respiratory system: Clear to auscultation. Respiratory effort normal. Cardiovascular system: S1 & S2 heard, RRR. No JVD, murmurs, rubs, gallops or clicks. No pedal edema. Gastrointestinal  system: Abdomen is  nontender distended but slightly tender to palpation. Central nervous system: Alert and oriented. No focal neurological deficits. Extremities: Symmetric 5 x 5 power. Skin: Diffuse skin bruising Psychiatry: Judgement and insight appear normal. Mood & affect appropriate.     Objective: Vitals:   05/17/22 2303 05/18/22 0305 05/18/22 0500 05/18/22 0735  BP: 100/70 (!) 85/60  (!) 90/49  Pulse: 66 (!) 58  62  Resp: _0 Temp: 98.5 F (36.9 C) 98.3 F (36.8 C)  (!) 97.3 F (36.3 C)  TempSrc:    Oral  SpO2: 100% 98%  98%  Weight:   56.3 kg   Height:        Intake/Output Summary (Last 24 hours) at 05/18/2022 0816 Last data filed at 05/18/2022 0511 Gross per 24 hour  Intake 974.51 ml  Output 402 ml  Net 572.51 ml   Filed Weights   05/17/22 1856 05/18/22 0500  Weight: 52 kg 56.3 kg     Data Reviewed:   CBC: Recent Labs  Lab 05/16/22 1650 05/17/22 1959 05/18/22 0614  WBC 3.9 2.1* 1.9*  NEUTROABS 2,835 1.3*  --   HGB 9.4* 8.6* 8.3*  HCT 29.1* 27.0* 26.0*  MCV 80.4 75.8* 76.0*  PLT 84* 107* 98*   Basic Metabolic Panel: Recent Labs  Lab 05/16/22 1650 05/17/22 1959 05/18/22 0614  NA 125* 126* 127*  K 4.2 3.6 3.3*  CL 84* 86* 90*  CO2 _1 GLUCOSE 71 79 49*  BUN 63* 64* 61*  CREATININE 2.38* 1.86* 1.72*  CALCIUM 10.0 9.6 9.5  MG 2.5  --  2.2   GFR: Estimated Creatinine Clearance: 29.6 mL/min (A) (by C-G formula based on SCr of 1.72 mg/dL (H)). Liver Function Tests: Recent Labs  Lab 05/16/22 1650 05/17/22 1959 05/18/22 0614  AST 28 33 29  ALT _2 ALKPHOS  --  74 67  BILITOT 3.8* 3.6* 3.3*  PROT 8.2* 7.8 7.2  ALBUMIN  --  3.5 3.2*   No results for input(s): LIPASE, AMYLASE in the last 168 hours. No results for input(s): AMMONIA in the last 168 hours. Coagulation Profile: Recent Labs  Lab 05/17/22 1959 05/18/22 0614  INR 2.1* 2.2*   Cardiac Enzymes: No results for input(s): CKTOTAL, CKMB, CKMBINDEX, TROPONINI in the last 168  hours. BNP (last 3 results) Recent Labs    07/04/21 1607 08/16/21 1620 02/23/22 1218  PROBNP 425.0* 601.0* 891.0*   HbA1C: No results for input(s): HGBA1C in the last 72 hours. CBG: Recent Labs  Lab 05/18/22 0704 05/18/22 0728  GLUCAP 49* 161*   Lipid Profile: No results for input(s): CHOL, HDL, LDLCALC, TRIG, CHOLHDL, LDLDIRECT in the last 72 hours. Thyroid Function Tests: No results for input(s): TSH, T4TOTAL, FREET4, T3FREE, THYROIDAB in the last 72 hours. Anemia Panel: Recent Labs    05/16/22 1650  VITAMINB12 >1504*  IRON 24*   Sepsis Labs: No results for input(s): PROCALCITON, LATICACIDVEN in the last 168 hours.  No results found for this or any previous visit (from the past 240 hour(s)).       Radiology Studies: DG Chest 2 View  Result Date: 05/16/2022 CLINICAL DATA:  CHF. EXAM: CHEST - 2 VIEW COMPARISON:  02/23/2022 FINDINGS: Lungs are adequately inflated without focal airspace consolidation. Findings suggest a small amount of right pleural fluid without significant change. Minimal hazy prominence of the perihilar markings suggesting a degree of vascular congestion without significant change. Mild stable cardiomegaly. Remainder of the  exam is unchanged. IMPRESSION: Stable cardiomegaly with suggestion of minimal vascular congestion. Small amount right pleural fluid unchanged. Electronically Signed   By: Marin Olp M.D.   On: 05/16/2022 16:58        Scheduled Meds:  busPIRone  7.5 mg Oral BID   famotidine  40 mg Oral BID   macitentan  10 mg Oral Daily   rosuvastatin  5 mg Oral Daily   Selexipag  200 mcg Oral BID   sodium chloride flush  3 mL Intravenous Q12H   tadalafil  20 mg Oral Daily   Continuous Infusions:   LOS: 1 day   Time spent= 35 mins    Sianna Garofano Arsenio Loader, MD [facility]  If 7PM-7AM, please contact night-coverage  05/18/2022, 8:16 AM

## 2022-05-19 DIAGNOSIS — I9589 Other hypotension: Secondary | ICD-10-CM

## 2022-05-19 DIAGNOSIS — Z952 Presence of prosthetic heart valve: Secondary | ICD-10-CM | POA: Diagnosis not present

## 2022-05-19 DIAGNOSIS — R195 Other fecal abnormalities: Secondary | ICD-10-CM

## 2022-05-19 DIAGNOSIS — I482 Chronic atrial fibrillation, unspecified: Secondary | ICD-10-CM | POA: Diagnosis not present

## 2022-05-19 DIAGNOSIS — Z7901 Long term (current) use of anticoagulants: Secondary | ICD-10-CM

## 2022-05-19 DIAGNOSIS — D649 Anemia, unspecified: Secondary | ICD-10-CM | POA: Diagnosis not present

## 2022-05-19 DIAGNOSIS — E871 Hypo-osmolality and hyponatremia: Secondary | ICD-10-CM

## 2022-05-19 DIAGNOSIS — N179 Acute kidney failure, unspecified: Secondary | ICD-10-CM | POA: Diagnosis not present

## 2022-05-19 DIAGNOSIS — I5042 Chronic combined systolic (congestive) and diastolic (congestive) heart failure: Secondary | ICD-10-CM | POA: Diagnosis not present

## 2022-05-19 LAB — BASIC METABOLIC PANEL
Anion gap: 8 (ref 5–15)
BUN: 45 mg/dL — ABNORMAL HIGH (ref 8–23)
CO2: 24 mmol/L (ref 22–32)
Calcium: 8.8 mg/dL — ABNORMAL LOW (ref 8.9–10.3)
Chloride: 93 mmol/L — ABNORMAL LOW (ref 98–111)
Creatinine, Ser: 1.61 mg/dL — ABNORMAL HIGH (ref 0.61–1.24)
GFR, Estimated: 44 mL/min — ABNORMAL LOW (ref >60)
Glucose, Bld: 121 mg/dL — ABNORMAL HIGH (ref 70–99)
Potassium: 3.2 mmol/L — ABNORMAL LOW (ref 3.5–5.1)
Sodium: 125 mmol/L — ABNORMAL LOW (ref 135–145)

## 2022-05-19 LAB — CBC
HCT: 24.9 % — ABNORMAL LOW (ref 39.0–52.0)
Hemoglobin: 7.7 g/dL — ABNORMAL LOW (ref 13.0–17.0)
MCH: 23.8 pg — ABNORMAL LOW (ref 26.0–34.0)
MCHC: 30.9 g/dL (ref 30.0–36.0)
MCV: 76.9 fL — ABNORMAL LOW (ref 80.0–100.0)
Platelets: 89 10*3/uL — ABNORMAL LOW (ref 150–400)
RBC: 3.24 MIL/uL — ABNORMAL LOW (ref 4.22–5.81)
RDW: 19.2 % — ABNORMAL HIGH (ref 11.5–15.5)
WBC: 3.7 10*3/uL — ABNORMAL LOW (ref 4.0–10.5)
nRBC: 0 % (ref 0.0–0.2)

## 2022-05-19 LAB — MRSA NEXT GEN BY PCR, NASAL: MRSA by PCR Next Gen: DETECTED — AB

## 2022-05-19 LAB — GLUCOSE, CAPILLARY
Glucose-Capillary: 125 mg/dL — ABNORMAL HIGH (ref 70–99)
Glucose-Capillary: 103 mg/dL — ABNORMAL HIGH (ref 70–99)
Glucose-Capillary: 93 mg/dL (ref 70–99)
Glucose-Capillary: 84 mg/dL (ref 70–99)
Glucose-Capillary: 103 mg/dL — ABNORMAL HIGH (ref 70–99)

## 2022-05-19 LAB — PROTIME-INR
INR: 2.3 — ABNORMAL HIGH (ref 0.8–1.2)
Prothrombin Time: 25.2 seconds — ABNORMAL HIGH (ref 11.4–15.2)

## 2022-05-19 LAB — MAGNESIUM: Magnesium: 2.1 mg/dL (ref 1.7–2.4)

## 2022-05-19 MED ORDER — SELEXIPAG 200 MCG PO TABS
200.0000 ug | ORAL_TABLET | Freq: Every day | ORAL | Status: DC
  Administered 2022-05-19 – 2022-05-28 (×9): 200 ug via ORAL

## 2022-05-19 MED ORDER — LACTATED RINGERS IV BOLUS
500.0000 mL | Freq: Once | INTRAVENOUS | Status: AC
Start: 2022-05-19 — End: 2022-05-19
  Administered 2022-05-19: 500 mL via INTRAVENOUS

## 2022-05-19 MED ORDER — POTASSIUM CHLORIDE 10 MEQ/100ML IV SOLN
10.0000 meq | INTRAVENOUS | Status: AC
  Administered 2022-05-19 (×4): 10 meq via INTRAVENOUS
  Filled 2022-05-19 (×4): qty 100

## 2022-05-19 MED ORDER — CHLORHEXIDINE GLUCONATE CLOTH 2 % EX PADS
6.0000 | MEDICATED_PAD | Freq: Every day | CUTANEOUS | Status: DC
  Administered 2022-05-19 – 2022-05-24 (×6): 6 via TOPICAL

## 2022-05-19 MED ORDER — MELATONIN 3 MG PO TABS
3.0000 mg | ORAL_TABLET | Freq: Once | ORAL | Status: AC
  Administered 2022-05-19: 3 mg via ORAL
  Filled 2022-05-19: qty 1

## 2022-05-19 MED ORDER — SELEXIPAG 400 MCG PO TABS
400.0000 ug | ORAL_TABLET | Freq: Every day | ORAL | Status: DC
  Administered 2022-05-19 – 2022-05-28 (×10): 400 ug via ORAL

## 2022-05-19 NOTE — Progress Notes (Signed)
Subjective:  Denies SSCP, palpitations or Dyspnea Wants to proceed with EGD/Colon  Objective:  Vitals:   05/19/22 0500 05/19/22 0600 05/19/22 0800 05/19/22 0808  BP: (!) 90/56 (!) 98/47 (!) 83/59   Pulse: 68 68 (!) 51   Resp: (!) 21 (!) 22 18   Temp:   (!) 97.4 F (36.3 C) (!) 97.4 F (36.3 C)  TempSrc:   Oral Oral  SpO2: 96% 94% 94%   Weight:      Height:        Intake/Output from previous day:  Intake/Output Summary (Last 24 hours) at 05/19/2022 1029 Last data filed at 05/19/2022 1012 Gross per 24 hour  Intake 3435.98 ml  Output 650 ml  Net 2785.98 ml    Physical Exam: Chronically ill Bruising Interstitial crackles SEM crisp S2 AVR click no AR Central abdominal scar no distension Stasis LE;s plus one edema  Lab Results: Basic Metabolic Panel: Recent Labs    05/18/22 0614 05/19/22 0251  NA 127* 125*  K 3.3* 3.2*  CL 90* 93*  CO2 26 24  GLUCOSE 49* 121*  BUN 61* 45*  CREATININE 1.72* 1.61*  CALCIUM 9.5 8.8*  MG 2.2 2.1   Liver Function Tests: Recent Labs    05/17/22 1959 05/18/22 0614  AST 33 29  ALT 21 18  ALKPHOS 74 67  BILITOT 3.6* 3.3*  PROT 7.8 7.2  ALBUMIN 3.5 3.2*   No results for input(s): LIPASE, AMYLASE in the last 72 hours. CBC: Recent Labs    05/16/22 1650 05/17/22 1959 05/18/22 0614 05/19/22 0251  WBC 3.9 2.1* 1.9* 3.7*  NEUTROABS 2,835 1.3*  --   --   HGB 9.4* 8.6* 8.3* 7.7*  HCT 29.1* 27.0* 26.0* 24.9*  MCV 80.4 75.8* 76.0* 76.9*  PLT 84* 107* 98* 89*   Cardiac Enzymes: No results for input(s): CKTOTAL, CKMB, CKMBINDEX, TROPONINI in the last 72 hours. BNP: Invalid input(s): POCBNP D-Dimer: No results for input(s): DDIMER in the last 72 hours. Hemoglobin A1C: No results for input(s): HGBA1C in the last 72 hours. Fasting Lipid Panel: No results for input(s): CHOL, HDL, LDLCALC, TRIG, CHOLHDL, LDLDIRECT in the last 72 hours. Thyroid Function Tests: No results for input(s): TSH, T4TOTAL, T3FREE, THYROIDAB in the  last 72 hours.  Invalid input(s): FREET3 Anemia Panel: Recent Labs    05/16/22 1650 05/18/22 0619  VITAMINB12 >1504*  --   FERRITIN  --  20*  TIBC  --  423  IRON 24* 18*    Imaging: DG Abd 1 View  Result Date: 05/18/2022 CLINICAL DATA:  Abdominal pain and constipation EXAM: ABDOMEN - 1 VIEW COMPARISON:  CT scan 01/31/2022 FINDINGS: Epicardial pacer lead fragments noted along the thoracoabdominal junction. Barium outlines diverticula in the colon, primarily the descending and sigmoid colon. Atherosclerosis is present, including aortoiliac atherosclerotic disease. No dilated bowel. 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 IMPRESSION: 1. Barium outlines diverticula primarily in the descending and sigmoid colon. No substantial colonic stool burden. 2.  Aortic Atherosclerosis (ICD10-I70.0). 3. Pars defects at L5 and substantial loss of disc height at L4-5 as shown on 01/31/2022. Electronically Signed   By: Van Clines M.D.   On: 05/18/2022 10:47    Cardiac Studies:  ECG:  Orders placed or performed during the hospital encounter of 05/17/22   EKG 12-Lead   EKG 12-Lead     Telemetry:  afib / PVC  Echo: EF 45-50% AVR normal   Medications:  busPIRone  7.5 mg Oral BID   Chlorhexidine Gluconate Cloth  6 each Topical Daily   docusate sodium  100 mg Oral BID   famotidine  40 mg Oral BID   macitentan  10 mg Oral Daily   pantoprazole (PROTONIX) IV  40 mg Intravenous Q12H   polyethylene glycol  17 g Oral BID   rosuvastatin  5 mg Oral Daily   Selexipag  200 mcg Oral QHS   And   Selexipag  400 mcg Oral Daily   sodium chloride flush  3 mL Intravenous Q12H   sucralfate  1 g Oral BID   tadalafil  20 mg Oral Daily      Assessment/Plan:     CHF:  he is dry ok to hydrate in setting of poor PO intake and GI bleeding EF 45-50% with RV failure and azotemiaHold diuretics He is not on ARB/ARNI due to CKD AVR:  normal exam and echo 10/22 He has not taken it  since Sunday but INR Rx today at 2.2 ok to continue to hold until Hb stable or GI decides they are not going to do EGD/Colon Will repeat INR this am should be low enough to proceed with EGD  Afib:  chronic rate control fine Has had MAZE and LAA clipping continue lopressor Pulmonary HTN:  WHO class 2/3 continue Tadalafil, Selexipag and opsumit as tolerated  Anemia:  US abdomen no ileus TBili elevated 3.3 plan per primary service and GI Hct continues to fall 24.9 this am From cardiac perspective certainly ok to proceed with EGD and probably colonoscopy if needed CRF:  diuretics held Cr 1.61 this am improved   Jenkins Rouge 05/19/2022, 10:29 AM

## 2022-05-19 NOTE — Progress Notes (Addendum)
    OVERNIGHT PROGRESS REPORT  Notified by RN for patient hypothermic and borderline hypotensive. Patient BP did respond to small bolus to greater than 90 SBP  Temperature remains in the 74F  range despite environmental warming efforts on the floor. Patient transferred to stepdown unit for Bare hugger and potential low blood pressure.  2nd Small bolus ordered due to low EF, Urine output, and BP.   Update: 0216 hrs   Patient is awake and conversational in SDU. BP 81/53 (62),   HR 64, SPO2 97%, RR 12  States that his BP runs no more than 100 sbp at night (120 sbp +/- on midodrine during the day)  No obvious or stated distress.  Gershon Cull MSNA MSN ACNPC-AG Acute Care Nurse Practitioner [place]

## 2022-05-19 NOTE — Progress Notes (Signed)
PROGRESS NOTE    Lance Hicks  KOE:[id number] DOB: July 27, 1946 DOA: 05/17/2022 PCP: [name], PA-C   Brief Narrative:  76 y.o. male with medical history significant for chronic combined systolic and diastolic CHF (EF 50-51 percent, G2DD), CAD s/p CABG, chronic atrial fibrillation and aortic stenosis s/p mechanical aortic valve replacement on Coumadin, pulmonary arterial hypertension, COPD, CKD stage IIIa, chronic hypotension, hepatic cirrhosis, anemia of chronic disease and iron deficiency who was admitted from [facility] for evaluation of generalized weakness associated with acute on chronic anemia.  Patient was then seen by her PCP on 5/23 where they obtained blood work, that showed hemoglobin of 8.9 and was Hemoccult positive.  Patient was eventually transferred to [facility] long. [facility] was consulted too.    Assessment & Plan:   Principal Problem:   Acute renal failure superimposed on stage 3a chronic kidney disease (HCC) Active Problems:   Hyponatremia   Acute on chronic anemia   Coronary artery disease involving native coronary artery of native heart with angina pectoris (HCC)   [name]   Pulmonary arterial hypertension (HCC)   Hypotension, chronic   Chronic atrial fibrillation (HCC)   COPD (chronic obstructive pulmonary disease) (HCC)   Chronic combined systolic and diastolic CHF (congestive heart failure) (HCC)   Heme positive stool   Chronic anticoagulation   Gastritis   * Acute renal failure superimposed on stage 3a chronic kidney disease (HCC) Improving, creatinine today 1.7.  Creatinine 2.38 prior to arrival with baseline 1.5-1.6.  Appears volume depleted on admission.  Currently getting gentle hydration. -Hold spironolactone, metformin, Farxiga -Monitor UOP   Hyponatremia Admission sodium 125, slowly improving with hydration.   Acute on chronic anemia. Microcytic Severe iron deficiency -Baseline hemoglobin 12,  admission hemoglobin 9.0, now 7.7  FOBT positive.  BUN is elevated.  [facility] has been consulted and following, Pepcid BID. Allergy to PPI.  IV iron ordered. -Reviewed cardiology, continue to hold Coumadin for now but low threshold to restart given mechanical AVR and A-fib   Chronic combined systolic and diastolic CHF (congestive heart failure) ([place]) Appeared hypovolemic on admission.  Last EF 45-50% with G2 DD on TTE 09/30/2021. -On gentle IV fluids as above.  Consulted cardiology for their input and comanagement, appreciate opinion. -Holding spironolactone and Farxiga with AKI   COPD (chronic obstructive pulmonary disease) ([place]) Stable without wheezing or dyspnea on admission.  Continue albuterol as needed.   Chronic atrial fibrillation (HCC) Remains in atrial fibrillation with controlled rate.  Not on rate controlling agents due to history of bradycardia.  Holding Coumadin for now as above.   Hypotension, chronic BP currently stable.  Has been on midodrine in the past.   Pulmonary arterial hypertension (HCC) Continue home selexipag, macitentan, tadalafil.   [name] Coumadin on hold for now as above.   Coronary artery disease involving native coronary artery of native heart with angina pectoris [facility]) S/p CABG.  Denies any chest pain.  Continue statin.       DVT prophylaxis: SCDs Start: 05/17/22 1931 Code Status: Full Code Family Communication: None at bedside Disposition Plan:    Patient will remain inpatient, not stable for discharge with declining hemoglobin Consults called: None Admission status: Inpatient   Consultants:  GI, cardiology  Procedures:  DG Chest 2 View  Result Date: 05/16/2022 CLINICAL DATA:  CHF. EXAM: CHEST - 2 VIEW COMPARISON:  02/23/2022 FINDINGS: Lungs are adequately inflated without focal airspace consolidation. Findings suggest a small amount  of right pleural fluid without significant change. Minimal hazy prominence  of the perihilar markings suggesting a degree of vascular congestion without significant change. Mild stable cardiomegaly. Remainder of the exam is unchanged. IMPRESSION: Stable cardiomegaly with suggestion of minimal vascular congestion. Small amount right pleural fluid unchanged. Electronically Signed   By: Marin Olp M.D.   On: 05/16/2022 16:58   DG Abd 1 View  Result Date: 05/18/2022 CLINICAL DATA:  Abdominal pain and constipation EXAM: ABDOMEN - 1 VIEW COMPARISON:  CT scan 01/31/2022 FINDINGS: Epicardial pacer lead fragments noted along the thoracoabdominal junction. Barium outlines diverticula in the colon, primarily the descending and sigmoid colon. Atherosclerosis is present, including aortoiliac atherosclerotic disease. No dilated bowel. 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 IMPRESSION: 1. Barium outlines diverticula primarily in the descending and sigmoid colon. No substantial colonic stool burden. 2.  Aortic Atherosclerosis (ICD10-I70.0). 3. Pars defects at L5 and substantial loss of disc height at L4-5 as shown on 01/31/2022. Electronically Signed   By: Van Clines M.D.   On: 05/18/2022 10:47   DG UGI W SMALL BOWEL DOUBLE CM  Result Date: 05/03/2022 CLINICAL DATA:  Patient with past medical history of dysphagia, recent nausea/vomiting, GERD, anemia, cirrhosis, CHF, COPD, pulmonary hypertension, coronary artery disease with prior CABG and mechanical AVR, chronic respiratory failure, aortic stenosis and atrial fibrillation. History of EGD in 2018 with gastritis. EXAM: DG UGI W/ SMALL BOWEL TECHNIQUE: Scout radiograph was obtained. Combined double and single contrast examination was performed using effervescent crystals, high-density barium and thin liquid barium. This exam was performed by APP name, and was supervised and interpreted by Rad name. FLUOROSCOPY: Radiation Exposure Index (as provided by the fluoroscopic device): 70.10 mGy Kerma COMPARISON:  CT  abdomen/pelvis 01/31/22 FINDINGS: Scout Radiograph: Normal bowel gas pattern. Retained epicardial pacing wires and previous median sternotomy noted. Aortic and branch vessel atherosclerosis. Esophagus: Normal appearance. Esophageal motility: Within normal limits for age. A single episode of aspiration into the trachea was noted. Esophageal reflux: No significant reflux elicited by provocative maneuvers Ingested 36m barium tablet: Passed normally Stomach: Mild diffuse rugal thickening without ulceration or focal mucosal abnormality. No significant hiatal hernia. Gastric emptying: Normal. Duodenum: Normal appearance. Other: Normal small bowel transit time. No small bowel wall thickening or distention identified. At subsequent fluoroscopy, no abnormality of the terminal ileum identified. IMPRESSION: 1. Single episode of silent aspiration noted. 2. Normal esophageal motility without focal mucosal abnormality. 3. Mild rugal fold thickening in the stomach as can be seen with gastritis. No evidence of ulceration. 4. The small bowel appears normal. Electronically Signed   By: WRichardean SaleM.D.   On: 05/03/2022 13:38     Subjective: Reports back mildly sore in bed today otherwise no acute complaints  Objective: Vitals:   05/19/22 0500 05/19/22 0600 05/19/22 0800 05/19/22 0808  BP: (!) 90/56 (!) 98/47 (!) 83/59   Pulse: 68 68 (!) 51   Resp: (!) 21 (!) 22 18   Temp:   (!) 97.4 F (36.3 C) (!) 97.4 F (36.3 C)  TempSrc:   Oral Oral  SpO2: 96% 94% 94%   Weight:      Height:        Intake/Output Summary (Last 24 hours) at 05/19/2022 1138 Last data filed at 05/19/2022 1012 Gross per 24 hour  Intake 3435.98 ml  Output 650 ml  Net 2785.98 ml   Filed Weights   05/17/22 1856 05/18/22 0500 05/19/22 0144  Weight: 52 kg  56.3 kg 55.9 kg    Examination:  General exam: Appears calm and comfortable  Respiratory system: Clear to auscultation. Respiratory effort normal. Cardiovascular system: S1 & S2 heard,  RRR. No JVD, murmurs, rubs, gallops or clicks. No pedal edema. Gastrointestinal system: Abdomen is nondistended, soft and nontender. No organomegaly or masses felt. Normal bowel sounds heard. Central nervous system: Alert and oriented. No focal neurological deficits. Extremities: Symmetric 5 x 5 power. Skin: Multiple ecchymosis Psychiatry: Judgement and insight appear normal. Mood & affect appropriate.     Data Reviewed: I have personally reviewed following labs and imaging studies  CBC: Recent Labs  Lab 05/16/22 1650 05/17/22 1959 05/18/22 0614 05/19/22 0251  WBC 3.9 2.1* 1.9* 3.7*  NEUTROABS 2,835 1.3*  --   --   HGB 9.4* 8.6* 8.3* 7.7*  HCT 29.1* 27.0* 26.0* 24.9*  MCV 80.4 75.8* 76.0* 76.9*  PLT 84* 107* 98* 89*   Basic Metabolic Panel: Recent Labs  Lab 05/16/22 1650 05/17/22 1959 05/18/22 0614 05/19/22 0251  NA 125* 126* 127* 125*  K 4.2 3.6 3.3* 3.2*  CL 84* 86* 90* 93*  CO2 _0 GLUCOSE 71 79 49* 121*  BUN 63* 64* 61* 45*  CREATININE 2.38* 1.86* 1.72* 1.61*  CALCIUM 10.0 9.6 9.5 8.8*  MG 2.5  --  2.2 2.1   GFR: Estimated Creatinine Clearance: 31.3 mL/min (A) (by C-G formula based on SCr of 1.61 mg/dL (H)). Liver Function Tests: Recent Labs  Lab 05/16/22 1650 05/17/22 1959 05/18/22 0614  AST 28 33 29  ALT _1 ALKPHOS  --  74 67  BILITOT 3.8* 3.6* 3.3*  PROT 8.2* 7.8 7.2  ALBUMIN  --  3.5 3.2*   No results for input(s): LIPASE, AMYLASE in the last 168 hours. No results for input(s): AMMONIA in the last 168 hours. Coagulation Profile: Recent Labs  Lab 05/17/22 1959 05/18/22 0614 05/19/22 1047  INR 2.1* 2.2* 2.3*   Cardiac Enzymes: No results for input(s): CKTOTAL, CKMB, CKMBINDEX, TROPONINI in the last 168 hours. BNP (last 3 results) Recent Labs    07/04/21 1607 08/16/21 1620 02/23/22 1218  PROBNP 425.0* 601.0* 891.0*   HbA1C: No results for input(s): HGBA1C in the last 72 hours. CBG: Recent Labs  Lab 05/18/22 0704  05/18/22 0728 05/18/22 1129 05/19/22 0353 05/19/22 0729  GLUCAP 49* 161* 129* 125* 103*   Lipid Profile: No results for input(s): CHOL, HDL, LDLCALC, TRIG, CHOLHDL, LDLDIRECT in the last 72 hours. Thyroid Function Tests: No results for input(s): TSH, T4TOTAL, FREET4, T3FREE, THYROIDAB in the last 72 hours. Anemia Panel: Recent Labs    05/16/22 1650 05/18/22 0619  VITAMINB12 >1504*  --   FERRITIN  --  20*  TIBC  --  423  IRON 24* 18*   Sepsis Labs: No results for input(s): PROCALCITON, LATICACIDVEN in the last 168 hours.  Recent Results (from the past 240 hour(s))  MRSA Next Gen by PCR, Nasal     Status: Abnormal   Collection Time: 05/19/22  1:38 AM   Specimen: Nasal Mucosa; Nasal Swab  Result Value Ref Range Status   MRSA by PCR Next Gen DETECTED (A) NOT DETECTED Final    Comment: (NOTE) The GeneXpert MRSA Assay (FDA approved for NASAL specimens only), is one component of a comprehensive MRSA colonization surveillance program. It is not intended to diagnose MRSA infection nor to guide or monitor treatment for MRSA infections. Test performance is not FDA approved in patients less than 2  years old. Performed at [facility], [place] [address], [place], [place] [address]          Radiology Studies: DG Abd 1 View  Result Date: 05/18/2022 CLINICAL DATA:  Abdominal pain and constipation EXAM: ABDOMEN - 1 VIEW COMPARISON:  CT scan 01/31/2022 FINDINGS: Epicardial pacer lead fragments noted along the thoracoabdominal junction. Barium outlines diverticula in the colon, primarily the descending and sigmoid colon. Atherosclerosis is present, including aortoiliac atherosclerotic disease. No dilated bowel. 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 IMPRESSION: 1. Barium outlines diverticula primarily in the descending and sigmoid colon. No substantial colonic stool burden. 2.  Aortic Atherosclerosis (ICD10-I70.0). 3. Pars defects at L5  and substantial loss of disc height at L4-5 as shown on 01/31/2022. Electronically Signed   By: Van Clines M.D.   On: 05/18/2022 10:47        Scheduled Meds:  busPIRone  7.5 mg Oral BID   Chlorhexidine Gluconate Cloth  6 each Topical Daily   docusate sodium  100 mg Oral BID   famotidine  40 mg Oral BID   macitentan  10 mg Oral Daily   pantoprazole (PROTONIX) IV  40 mg Intravenous Q12H   polyethylene glycol  17 g Oral BID   rosuvastatin  5 mg Oral Daily   Selexipag  200 mcg Oral QHS   And   Selexipag  400 mcg Oral Daily   sodium chloride flush  3 mL Intravenous Q12H   sucralfate  1 g Oral BID   tadalafil  20 mg Oral Daily   Continuous Infusions:   LOS: 2 days    Time spent: 35 min    Nicolette Bang, MD [facility]  If 7PM-7AM, please contact night-coverage  05/19/2022, 11:38 AM

## 2022-05-20 DIAGNOSIS — I482 Chronic atrial fibrillation, unspecified: Secondary | ICD-10-CM | POA: Diagnosis not present

## 2022-05-20 DIAGNOSIS — Z952 Presence of prosthetic heart valve: Secondary | ICD-10-CM | POA: Diagnosis not present

## 2022-05-20 DIAGNOSIS — F05 Delirium due to known physiological condition: Secondary | ICD-10-CM

## 2022-05-20 DIAGNOSIS — D649 Anemia, unspecified: Secondary | ICD-10-CM | POA: Diagnosis not present

## 2022-05-20 DIAGNOSIS — I5042 Chronic combined systolic (congestive) and diastolic (congestive) heart failure: Secondary | ICD-10-CM | POA: Diagnosis not present

## 2022-05-20 DIAGNOSIS — N179 Acute kidney failure, unspecified: Secondary | ICD-10-CM | POA: Diagnosis not present

## 2022-05-20 DIAGNOSIS — Z7901 Long term (current) use of anticoagulants: Secondary | ICD-10-CM | POA: Diagnosis not present

## 2022-05-20 LAB — CBC WITH DIFFERENTIAL/PLATELET
Abs Immature Granulocytes: 0.01 10*3/uL (ref 0.00–0.07)
Basophils Absolute: 0 10*3/uL (ref 0.0–0.1)
Basophils Relative: 0 %
Eosinophils Absolute: 0 10*3/uL (ref 0.0–0.5)
Eosinophils Relative: 0 %
HCT: 28.8 % — ABNORMAL LOW (ref 39.0–52.0)
Hemoglobin: 8.7 g/dL — ABNORMAL LOW (ref 13.0–17.0)
Immature Granulocytes: 0 %
Lymphocytes Relative: 7 %
Lymphs Abs: 0.3 10*3/uL — ABNORMAL LOW (ref 0.7–4.0)
MCH: 23.6 pg — ABNORMAL LOW (ref 26.0–34.0)
MCHC: 30.2 g/dL (ref 30.0–36.0)
MCV: 78.3 fL — ABNORMAL LOW (ref 80.0–100.0)
Monocytes Absolute: 0.4 10*3/uL (ref 0.1–1.0)
Monocytes Relative: 11 %
Neutro Abs: 3.1 10*3/uL (ref 1.7–7.7)
Neutrophils Relative %: 82 %
Platelets: 111 10*3/uL — ABNORMAL LOW (ref 150–400)
RBC: 3.68 MIL/uL — ABNORMAL LOW (ref 4.22–5.81)
RDW: 19.7 % — ABNORMAL HIGH (ref 11.5–15.5)
WBC: 3.8 10*3/uL — ABNORMAL LOW (ref 4.0–10.5)
nRBC: 0 % (ref 0.0–0.2)

## 2022-05-20 LAB — GLUCOSE, CAPILLARY
Glucose-Capillary: 99 mg/dL (ref 70–99)
Glucose-Capillary: 109 mg/dL — ABNORMAL HIGH (ref 70–99)
Glucose-Capillary: 105 mg/dL — ABNORMAL HIGH (ref 70–99)
Glucose-Capillary: 98 mg/dL (ref 70–99)

## 2022-05-20 LAB — BASIC METABOLIC PANEL
Anion gap: 9 (ref 5–15)
BUN: 42 mg/dL — ABNORMAL HIGH (ref 8–23)
CO2: 24 mmol/L (ref 22–32)
Calcium: 9.2 mg/dL (ref 8.9–10.3)
Chloride: 90 mmol/L — ABNORMAL LOW (ref 98–111)
Creatinine, Ser: 1.67 mg/dL — ABNORMAL HIGH (ref 0.61–1.24)
GFR, Estimated: 42 mL/min — ABNORMAL LOW (ref >60)
Glucose, Bld: 96 mg/dL (ref 70–99)
Potassium: 4.3 mmol/L (ref 3.5–5.1)
Sodium: 123 mmol/L — ABNORMAL LOW (ref 135–145)

## 2022-05-20 LAB — SODIUM, URINE, RANDOM: Sodium, Ur: <10 mmol/L

## 2022-05-20 LAB — OSMOLALITY, URINE: Osmolality, Ur: 325 mOsm/kg (ref 300–900)

## 2022-05-20 LAB — OSMOLALITY: Osmolality: 275 mOsm/kg (ref 275–295)

## 2022-05-20 LAB — PROTIME-INR
INR: 2 — ABNORMAL HIGH (ref 0.8–1.2)
Prothrombin Time: 22.5 seconds — ABNORMAL HIGH (ref 11.4–15.2)

## 2022-05-20 LAB — MAGNESIUM: Magnesium: 2.2 mg/dL (ref 1.7–2.4)

## 2022-05-20 MED ORDER — WARFARIN SODIUM 2.5 MG PO TABS
2.5000 mg | ORAL_TABLET | Freq: Once | ORAL | Status: AC
  Administered 2022-05-20: 2.5 mg via ORAL
  Filled 2022-05-20: qty 1

## 2022-05-20 MED ORDER — WARFARIN - PHARMACIST DOSING INPATIENT: Freq: Every day | Status: DC

## 2022-05-20 NOTE — Progress Notes (Signed)
ANTICOAGULATION CONSULT NOTE - Initial Consult  Pharmacy Consult for Warfarin Indication: Mechanical AVR and Afib  Allergies  Allergen Reactions   Augmentin [Amoxicillin-Pot Clavulanate] Anaphylaxis and Diarrhea    "Upset stomach"   Amoxicillin     Other reaction(s): Nausea/Vomiting   Chicken Allergy Other (See Comments)    Other reaction(s): Unknown   Clavulanic Acid     Other reaction(s): Nausea/Vomiting   Pantoprazole Sodium Nausea Only    Gets gassy and starting itching like crazy Gets gassy and starting itching like crazy   Tizanidine     Other reaction(s): See Comments   Valium [Diazepam] Other (See Comments)    HA and Abd pain.   Tape Rash and Other (See Comments)    Surgical tape   Wound Dressing Adhesive Other (See Comments) and Rash    Surgical tape Surgical tape Surgical tape    Patient Measurements: Height: _0  (175.3 cm) Weight: 55.9 kg (123 lb 3.8 oz) IBW/kg (Calculated) : 70.7  Vital Signs: Temp: 97.2 F (36.2 C) (05/27 0745) Temp Source: Oral (05/27 0745) BP: 108/64 (05/27 0800) Pulse Rate: 71 (05/27 0800)  Labs: Recent Labs    05/18/22 0614 05/19/22 0251 05/19/22 1047 05/20/22 0248 05/20/22 0905  HGB 8.3* 7.7*  --  8.7*  --   HCT 26.0* 24.9*  --  28.8*  --   PLT 98* 89*  --  111*  --   LABPROT 23.9*  --  25.2*  --  22.5*  INR 2.2*  --  2.3*  --  2.0*  CREATININE 1.72* 1.61*  --  1.67*  --     Estimated Creatinine Clearance: 30.2 mL/min (A) (by C-G formula based on SCr of 1.67 mg/dL (H)).   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 In light of concurrent left nephrolithiasis, will check CT renal colic next year instead of renal US.    Anticoagulated on Coumadin 01/04/2018   Anxiety 05/10/2016   Last Assessment & Plan:  Doing  well off of zoloft.   Asthma    Asymptomatic microscopic hematuria 06/20/2017   Last Assessment & Plan:  Had hematuria workup in [facility] in 2016 which negative CT and cystoscopy. UA with 2+ blood last visit - we discussed recommendation for repeat workup at 5 years or if degree of hematuria progresses.    Atrial fibrillation ([place]) 03/29/2017   Atrial flutter ([place]) 03/29/2017   Backache 12/14/2015   Last Assessment & Plan:  Pain management referral for further evaluation.   Bilateral pleural effusion 08/09/2017   CHF (congestive heart failure) (HCC)    Chronic allergic rhinitis 04/28/2016   Last Assessment & Plan:  Continue astelin   Chronic anticoagulation 03/29/2017   Chronic atrial fibrillation ([place]) 07/23/2015   Last Assessment & Plan:  Coumadin and metoprolol, cardiology referral to establish care.   Chronic midline back pain 12/14/2015   Last Assessment & Plan:  Pain management referral for further evaluation.   Chronic obstructive lung disease ([place]) 06/12/2016   With hypoxia   Chronic prostatitis 07/23/2015   Last Assessment & Plan:  Has largely resolved since stopping bike riding. Recommend annual DRE AND PSA - will see back 12/2015 for annual screening, given 1st degree fhx. To call office for recurrent prostatitis symptoms.    Chronic  respiratory failure with hypoxia ([place]) 07/29/2019   Cirrhosis of liver not due to alcohol ([place]) 07/01/2019   COPD (chronic obstructive pulmonary disease) ([place]) 06/12/2016   Coronary arteriosclerosis in native artery 03/29/2017   Coronary artery disease involving native coronary artery of native heart with angina pectoris ([place]) 03/29/2017   Cough 10/17/2016   Last Assessment & Plan:  Discussed typical course for acute viral illness. If symptoms worsen or fail to improve by 7-10d, delayed ATBs, fluids, rest, NSAIDs/APAP prn. Seek care if not improving. Needs earlier INR check due to ATBs.   Dyspnea 02/01/2016   Last Assessment & Plan:  Overall improving, eval by pulm, plan for  CT, neg stress test with cardiology. Recent switch to carvedilol due to side effects.   Encounter for therapeutic drug monitoring 01/06/2019   Enterococcal bacteremia    Epidermoid cyst of skin 08/24/2017   Essential hypertension 12/14/2015   Last Assessment & Plan:  Hypertension control: controlled  Medications: compliant Medication Management: as noted in orders Home blood pressure monitoring recommended additionally as needed for symptoms  The patient's care plan was reviewed and updated. Instructions and counseling were provided regarding patient goals and barriers. He was counseled to adopt a healthy lifestyle. Educational resources and self-management tools have been provided as charted in [facility] list.    H/O maze procedure 03/29/2017   [name] 03/29/2017   Overview:  2011   History of coronary artery bypass graft 03/29/2017   Hx of CABG 03/29/2017   Hyperlipidemia 03/29/2017   Hypersensitivity angiitis ([place]) 10/01/2017   Hypertensive heart disease 03/29/2017   Hypertensive heart disease with heart failure ([place]) 03/29/2017   Hypertensive heart failure ([place]) 03/29/2017   Hypokalemia due to excessive renal loss of potassium 02/18/2018   Hypotension, chronic 06/06/2018   International normalized ratio (INR) raised 07/26/2017   Kidney stone 07/23/2015   Kidney stones 07/23/2015   Overview:  x 3  Last Assessment & Plan:  By [place] has left nephrolithiasis, but not visible by KUB. Will check CT renal colic next year to assess both stone burden as well as to surveil AML.    Left ureteral stone 01/23/2018   Leukocytoclastic vasculitis ([place]) 10/01/2017   Localized edema 01/04/2018   Long term (current) use of anticoagulants 03/29/2017   Lumbar radicular pain 01/19/2016   Lumbar radiculopathy 01/19/2016   Maculopapular rash 09/03/2017   Microscopic hematuria 06/20/2017   Last Assessment & Plan:  Had hematuria workup in [facility] in 2016 which negative CT and cystoscopy. UA with 2+ blood last visit - we  discussed recommendation for repeat workup at 5 years or if degree of hematuria progresses.    Multiple nodules of lung 06/12/2016   Nasal discharge 02/25/2016   Last Assessment & Plan:  Trial zyrtec and flonase   Nephrolithiasis 07/23/2015   Overview:  x 3  Last Assessment & Plan:  By [place] has left nephrolithiasis, but not visible by KUB. Will check CT renal colic next year to assess both stone burden as well as to surveil AML.   Overview:  x 3  Last Assessment & Plan:  Has 67m nonobstructing LUP stone - not visible by KUB.  Will check renal [place]2019 - he will contact office sooner if symptomatic.    [place]-sustained ventricular tachycardia ([place] 03/29/2017   Nonsustained ventricular tachycardia ([place] 03/29/2017   Other hyperlipidemia 03/29/2017   Palpitations 10/01/2017   Pleural effusion, bilateral 08/09/2017   Pneumonia due to COVID-19 virus 02/07/2021   Post-nasal  drainage 02/25/2016   Last Assessment & Plan:  Trial zyrtec and flonase   Prostate cancer screening 06/20/2017   Last Assessment & Plan:  Recommend continued annual CaP screening until within 10 years of life expectancy. Given good health and fhx of longevity, would anticipate CaP screening to continue until age 57.  PSA today and again in one year on day of visit.   Pulmonary arterial hypertension ([place]) 02/20/2018   Pulmonary edema    Pulmonary hypertension ([place]) 08/09/2017   Pulmonary nodules 06/12/2016   S/P AVR 03/20/2016   S/P AVR (aortic valve replacement) 03/20/2016   Strain of deltoid muscle, initial encounter    Supratherapeutic INR 07/26/2017   Syncope and collapse 02/01/2016   Typical atrial flutter ([place]) 02/01/2016    Medications:  Scheduled:   busPIRone  7.5 mg Oral BID   Chlorhexidine Gluconate Cloth  6 each Topical Daily   docusate sodium  100 mg Oral BID   famotidine  40 mg Oral BID   macitentan  10 mg Oral Daily   pantoprazole (PROTONIX) IV  40 mg Intravenous Q12H   polyethylene glycol  17 g Oral BID   rosuvastatin  5 mg Oral  Daily   Selexipag  200 mcg Oral QHS   And   Selexipag  400 mcg Oral Daily   sodium chloride flush  3 mL Intravenous Q12H   sucralfate  1 g Oral BID   tadalafil  20 mg Oral Daily   Infusions:  PRN: acetaminophen **OR** acetaminophen, hydrALAZINE, ipratropium-albuterol, metoprolol tartrate, ondansetron **OR** ondansetron (ZOFRAN) IV, senna-docusate, traZODone  PTA warfarin dose 2.10m daily except 516mMon/Wed/Sat - last dose taken 5/22  Assessment: [age]o male with hx mechanical aortic valve and afib on chronic warfarin admitted with acute on chronic anemia.  Warfarin has been held since admission and GI following.  Pharmacy has been consulted to resume warfarin since GI is not planning any intervention at this time as he is deemed to be high risk and Hgb is now stable.  Today, 05/20/2022 INR 2.0 CBC: Hgb 8.7, Plts 111 (low but improved) AST/ALT WNL, Tbili elevated 3.3 (5/25)  Goal of Therapy:  INR 2.5-3 per outpatient clinic notes   Plan:  Warfarin 2.65m70mO x 1 today Daily PT/INR Monitor closely for signs/symptoms of bleeding  EriPeggyann JubaharmD, BCPS Pharmacy: [phone]/2023,10:33 AM

## 2022-05-20 NOTE — Progress Notes (Signed)
Subjective:  Sitting in chair taking liquids   Objective:  Vitals:   05/20/22 0600 05/20/22 0740 05/20/22 0745 05/20/22 0800  BP: (!) 97/49 (!) 82/53 (!) 90/45 108/64  Pulse: 78 77 71 71  Resp: (!) _0 Temp: (!) 97.5 F (36.4 C)  (!) 97.2 F (36.2 C)   TempSrc: Oral  Oral   SpO2: 98% (!) 85% 98% 100%  Weight:      Height:        Intake/Output from previous day:  Intake/Output Summary (Last 24 hours) at 05/20/2022 0939 Last data filed at 05/20/2022 0859 Gross per 24 hour  Intake 513.6 ml  Output 800 ml  Net -286.4 ml    Physical Exam: Chronically ill Bruising Interstitial crackles SEM crisp S2 AVR click no AR Central abdominal scar no distension Stasis LE;s plus one edema  Lab Results: Basic Metabolic Panel: Recent Labs    05/19/22 0251 05/20/22 0248  NA 125* 123*  K 3.2* 4.3  CL 93* 90*  CO2 24 24  GLUCOSE 121* 96  BUN 45* 42*  CREATININE 1.61* 1.67*  CALCIUM 8.8* 9.2  MG 2.1 2.2   Liver Function Tests: Recent Labs    05/17/22 1959 05/18/22 0614  AST 33 29  ALT 21 18  ALKPHOS 74 67  BILITOT 3.6* 3.3*  PROT 7.8 7.2  ALBUMIN 3.5 3.2*   No results for input(s): LIPASE, AMYLASE in the last 72 hours. CBC: Recent Labs    05/17/22 1959 05/18/22 0614 05/19/22 0251 05/20/22 0248  WBC 2.1*   < > 3.7* 3.8*  NEUTROABS 1.3*  --   --  3.1  HGB 8.6*   < > 7.7* 8.7*  HCT 27.0*   < > 24.9* 28.8*  MCV 75.8*   < > 76.9* 78.3*  PLT 107*   < > 89* 111*   < > = values in this interval not displayed.    Anemia Panel: Recent Labs    05/18/22 0619  FERRITIN 20*  TIBC 423  IRON 18*    Imaging: DG Abd 1 View  Result Date: 05/18/2022 CLINICAL DATA:  Abdominal pain and constipation EXAM: ABDOMEN - 1 VIEW COMPARISON:  CT scan 01/31/2022 FINDINGS: Epicardial pacer lead fragments noted along the thoracoabdominal junction. Barium outlines diverticula in the colon, primarily the descending and sigmoid colon. Atherosclerosis is present, including  aortoiliac atherosclerotic disease. No dilated bowel. 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 IMPRESSION: 1. Barium outlines diverticula primarily in the descending and sigmoid colon. No substantial colonic stool burden. 2.  Aortic Atherosclerosis (ICD10-I70.0). 3. Pars defects at L5 and substantial loss of disc height at L4-5 as shown on 01/31/2022. Electronically Signed   By: Van Clines M.D.   On: 05/18/2022 10:47    Cardiac Studies:  ECG: Afib PVC   Telemetry:  afib / PVC  Echo: EF 45-50% AVR normal   Medications:    busPIRone  7.5 mg Oral BID   Chlorhexidine Gluconate Cloth  6 each Topical Daily   docusate sodium  100 mg Oral BID   famotidine  40 mg Oral BID   macitentan  10 mg Oral Daily   pantoprazole (PROTONIX) IV  40 mg Intravenous Q12H   polyethylene glycol  17 g Oral BID   rosuvastatin  5 mg Oral Daily   Selexipag  200 mcg Oral QHS   And   Selexipag  400 mcg Oral Daily   sodium chloride  flush  3 mL Intravenous Q12H   sucralfate  1 g Oral BID   tadalafil  20 mg Oral Daily      Assessment/Plan:     CHF:  he is dry ok to hydrate in setting of poor PO intake and GI bleeding EF 45-50% with RV failure and azotemia Hold diuretics He is not on ARB/ARNI due to CKD AVR:  normal exam and echo 10/22 He has not taken it since Sunday but INR Rx today at 2.3 ok to continue to hold until Hb stable or GI decides they are not going to do EGD/Colon  Suspect congestive hepatopathy responsible for "auto anticoagulation" and elevated INR desptie last coumadin dose Sunday  Afib:  chronic rate control fine Has had MAZE and LAA clipping continue lopressor iv as needed  Pulmonary HTN:  WHO class 2/3 continue Tadalafil, Selexipag and opsumit as tolerated  Anemia:  US abdomen no ileus TBili elevated 3.3 plan per primary service and GI Hct better this am  28.8 CRF:  diuretics held Cr 1.67 this am improved   Jenkins Rouge 05/20/2022, 9:39 AM

## 2022-05-20 NOTE — Progress Notes (Signed)
PROGRESS NOTE    Lance Hicks  QEH:[id number] DOB: 01/08/1946 DOA: 05/17/2022 PCP: [name], PA-C   Brief Narrative:  76 y.o. male with medical history significant for chronic combined systolic and diastolic CHF (EF 88-83 percent, G2DD), CAD s/p CABG, chronic atrial fibrillation and aortic stenosis s/p mechanical aortic valve replacement on Coumadin, pulmonary arterial hypertension, COPD, CKD stage IIIa, chronic hypotension, hepatic cirrhosis, anemia of chronic disease and iron deficiency who was admitted from [facility] for evaluation of generalized weakness associated with acute on chronic anemia.  Patient was then seen by her PCP on 5/23 where they obtained blood work, that showed hemoglobin of 8.9 and was Hemoccult positive.  Patient was eventually transferred to [facility] long. [facility] was consulted too. At his point GI is holding on intervention due to hi-risk.  Coumadin was held but because of mech. heart valve but will be restarted since no procedure planned.   Assessment & Plan:   Principal Problem:   Acute renal failure superimposed on stage 3a chronic kidney disease (HCC) Active Problems:   Hyponatremia   Acute on chronic anemia   Coronary artery disease involving native coronary artery of native heart with angina pectoris (HCC)   [name]   Pulmonary arterial hypertension (HCC)   Hypotension, chronic   Chronic atrial fibrillation (HCC)   COPD (chronic obstructive pulmonary disease) (HCC)   Chronic combined systolic and diastolic CHF (congestive heart failure) (HCC)   Heme positive stool   Chronic anticoagulation   Gastritis   Acute renal failure superimposed on stage 3a chronic kidney disease (HCC) Improving, creatinine today 1.7.  Creatinine 2.38 prior to arrival with baseline 1.5-1.6.  Appeared volume depleted on admission, received gentle hydration. -Hold spironolactone, metformin, Farxiga -Monitor  UOP  Delirium: -Patient is developing a delirium -Called neighbors at midnight and at 3 AM -Frequent day night review orientation -Avoid antihistamines and benzos as possible   Hyponatremia Admission sodium 125, slowly improving with hydration.   Acute on chronic anemia. Microcytic Severe iron deficiency -Baseline hemoglobin 12, admission hemoglobin 9.0, now 7.7  FOBT positive.  BUN is elevated.  [facility] has been consulted and following, Pepcid BID. Allergy to PPI.  IV iron ordered. -Reviewed cardiology, will restart coumadin withpharm consult todat, hgb stable in setting of mech heart valve   Chronic combined systolic and diastolic CHF (congestive heart failure) ([place]) Appeared hypovolemic on admission.  Last EF 45-50% with G2 DD on TTE 09/30/2021. -Consulted cardiology for their input and comanagement, appreciate opinion. -Holding spironolactone and Farxiga with AKI   COPD (chronic obstructive pulmonary disease) ([place]) Stable without wheezing or dyspnea on admission.  Continue albuterol as needed.   Chronic atrial fibrillation (HCC) Remains in atrial fibrillation with controlled rate.  Not on rate controlling agents due to history of bradycardia.  Holding Coumadin for now as above.   Hypotension, chronic BP currently stable.  Has been on midodrine in the past.   Pulmonary arterial hypertension (HCC) Continue home selexipag, macitentan, tadalafil.   [name] Coumadin on hold for now as above.   Coronary artery disease involving native coronary artery of native heart with angina pectoris [facility]) S/p CABG.  Denies any chest pain.  Continue statin.       DVT prophylaxis: SCDs Start: 05/17/22 1931, coumadin restarted 5/27 Code Status:  full code    Code Status Orders  (From admission, onward)           Start  Ordered   05/17/22 1932  Full code  Continuous        05/17/22 1933           Code Status History     Date Active  Date Inactive Code Status Order ID Comments User Context   01/25/2022 [id number] 02/05/2022 1922 DNR [id number]  Lin Landsman, NP Inpatient   01/23/2022 1818 01/25/2022 1641 Full Code [id number]  Lequita Halt, MD ED   11/03/2021 1718 11/09/2021 1727 Full Code [id number]  Consuelo Pandy, PA-C Inpatient   09/29/2021 1430 10/04/2021 2049 Full Code [id number]  Jolaine Artist, MD Inpatient   05/14/2018 1145 05/23/2018 1709 Full Code [id number]  Dayson, Aboud, PA-C Inpatient   01/18/2018 1104 01/18/2018 1529 Full Code [id number]  Bensimhon, Shaune Pascal, MD Inpatient      Advance Directive Documentation    Flowsheet Row Most Recent Value  Type of Advance Directive Healthcare Power of Attorney  Pre-existing out of facility DNR order (yellow form or pink MOST form) --  "MOST" Form in Place? --      Family Communication: discussed with Nate, neighbor who assists patient when he can  Disposition Plan:    patient not stable for discharge, developing delirium Consults called: None Admission status: Inpatient   Consultants:  Gi cards  Procedures:  DG Chest 2 View  Result Date: 05/16/2022 CLINICAL DATA:  CHF. EXAM: CHEST - 2 VIEW COMPARISON:  02/23/2022 FINDINGS: Lungs are adequately inflated without focal airspace consolidation. Findings suggest a small amount of right pleural fluid without significant change. Minimal hazy prominence of the perihilar markings suggesting a degree of vascular congestion without significant change. Mild stable cardiomegaly. Remainder of the exam is unchanged. IMPRESSION: Stable cardiomegaly with suggestion of minimal vascular congestion. Small amount right pleural fluid unchanged. Electronically Signed   By: Marin Olp M.D.   On: 05/16/2022 16:58   DG Abd 1 View  Result Date: 05/18/2022 CLINICAL DATA:  Abdominal pain and constipation EXAM: ABDOMEN - 1 VIEW COMPARISON:  CT scan 01/31/2022 FINDINGS: Epicardial pacer lead fragments noted along the thoracoabdominal  junction. Barium outlines diverticula in the colon, primarily the descending and sigmoid colon. Atherosclerosis is present, including aortoiliac atherosclerotic disease. No dilated bowel. 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 IMPRESSION: 1. Barium outlines diverticula primarily in the descending and sigmoid colon. No substantial colonic stool burden. 2.  Aortic Atherosclerosis (ICD10-I70.0). 3. Pars defects at L5 and substantial loss of disc height at L4-5 as shown on 01/31/2022. Electronically Signed   By: Van Clines M.D.   On: 05/18/2022 10:47   DG UGI W SMALL BOWEL DOUBLE CM  Result Date: 05/03/2022 CLINICAL DATA:  Patient with past medical history of dysphagia, recent nausea/vomiting, GERD, anemia, cirrhosis, CHF, COPD, pulmonary hypertension, coronary artery disease with prior CABG and mechanical AVR, chronic respiratory failure, aortic stenosis and atrial fibrillation. History of EGD in 2018 with gastritis. EXAM: DG UGI W/ SMALL BOWEL TECHNIQUE: Scout radiograph was obtained. Combined double and single contrast examination was performed using effervescent crystals, high-density barium and thin liquid barium. This exam was performed by APP name, and was supervised and interpreted by Rad name. FLUOROSCOPY: Radiation Exposure Index (as provided by the fluoroscopic device): 70.10 mGy Kerma COMPARISON:  CT abdomen/pelvis 01/31/22 FINDINGS: Scout Radiograph: Normal bowel gas pattern. Retained epicardial pacing wires and previous median sternotomy noted. Aortic and branch vessel atherosclerosis. Esophagus: Normal appearance. Esophageal motility: Within normal limits for age. A single episode of  aspiration into the trachea was noted. Esophageal reflux: No significant reflux elicited by provocative maneuvers Ingested 22m barium tablet: Passed normally Stomach: Mild diffuse rugal thickening without ulceration or focal mucosal abnormality. No significant hiatal hernia. Gastric  emptying: Normal. Duodenum: Normal appearance. Other: Normal small bowel transit time. No small bowel wall thickening or distention identified. At subsequent fluoroscopy, no abnormality of the terminal ileum identified. IMPRESSION: 1. Single episode of silent aspiration noted. 2. Normal esophageal motility without focal mucosal abnormality. 3. Mild rugal fold thickening in the stomach as can be seen with gastritis. No evidence of ulceration. 4. The small bowel appears normal. Electronically Signed   By: WRichardean SaleM.D.   On: 05/03/2022 13:38       Subjective: Pt confused this AM, called neighbors at midnight and 3am sent non-sdensical texts  Objective: Vitals:   05/20/22 1100 05/20/22 1200 05/20/22 1227 05/20/22 1300  BP: (!) 90/45 (!) 107/59  (!) 89/56  Pulse: 69 65  (!) 52  Resp: (!) 9 (!) 22  18  Temp:   97.8 F (36.6 C)   TempSrc:   Oral   SpO2: 100% 98%  99%  Weight:      Height:        Intake/Output Summary (Last 24 hours) at 05/20/2022 1312 Last data filed at 05/20/2022 0859 Gross per 24 hour  Intake 210 ml  Output 800 ml  Net -590 ml   Filed Weights   05/18/22 0500 05/19/22 0144 05/20/22 0500  Weight: 56.3 kg 55.9 kg 55.9 kg    Examination:  General exam: Appears calm, but confused Respiratory system: Clear to auscultation. Respiratory effort normal. Cardiovascular system: S1 & S2 heard, RRR. No JVD, murmurs, rubs, gallops or clicks. No pedal edema. Gastrointestinal system: Abdomen is nondistended, soft and nontender. No organomegaly or masses felt. Normal bowel sounds heard. Central nervous system: Alert,  No new focal neurological deficits. Extremities: Symmetric 5 x 5 power. Skin: No rashes, lesions or ulcers Psychiatry: impaired judgement, confused, no acute decomp    Data Reviewed: I have personally reviewed following labs and imaging studies  CBC: Recent Labs  Lab 05/16/22 1650 05/17/22 1959 05/18/22 0614 05/19/22 0251 05/20/22 0248  WBC 3.9  2.1* 1.9* 3.7* 3.8*  NEUTROABS 2,835 1.3*  --   --  3.1  HGB 9.4* 8.6* 8.3* 7.7* 8.7*  HCT 29.1* 27.0* 26.0* 24.9* 28.8*  MCV 80.4 75.8* 76.0* 76.9* 78.3*  PLT 84* 107* 98* 89* 1940   Basic Metabolic Panel: Recent Labs  Lab 05/16/22 1650 05/17/22 1959 05/18/22 0614 05/19/22 0251 05/20/22 0248  NA 125* 126* 127* 125* 123*  K 4.2 3.6 3.3* 3.2* 4.3  CL 84* 86* 90* 93* 90*  CO2 _0 GLUCOSE 71 79 49* 121* 96  BUN 63* 64* 61* 45* 42*  CREATININE 2.38* 1.86* 1.72* 1.61* 1.67*  CALCIUM 10.0 9.6 9.5 8.8* 9.2  MG 2.5  --  2.2 2.1 2.2   GFR: Estimated Creatinine Clearance: 30.2 mL/min (A) (by C-G formula based on SCr of 1.67 mg/dL (H)). Liver Function Tests: Recent Labs  Lab 05/16/22 1650 05/17/22 1959 05/18/22 0614  AST 28 33 29  ALT _1 ALKPHOS  --  74 67  BILITOT 3.8* 3.6* 3.3*  PROT 8.2* 7.8 7.2  ALBUMIN  --  3.5 3.2*   No results for input(s): LIPASE, AMYLASE in the last 168 hours. No results for input(s): AMMONIA in the last 168 hours. Coagulation Profile: Recent Labs  Lab 05/17/22 1959 05/18/22 0614 05/19/22 1047 05/20/22 0905  INR 2.1* 2.2* 2.3* 2.0*   Cardiac Enzymes: No results for input(s): CKTOTAL, CKMB, CKMBINDEX, TROPONINI in the last 168 hours. BNP (last 3 results) Recent Labs    07/04/21 1607 08/16/21 1620 02/23/22 1218  PROBNP 425.0* 601.0* 891.0*   HbA1C: No results for input(s): HGBA1C in the last 72 hours. CBG: Recent Labs  Lab 05/19/22 1215 05/19/22 1557 05/19/22 2123 05/20/22 0732 05/20/22 1234  GLUCAP 93 84 103* 99 109*   Lipid Profile: No results for input(s): CHOL, HDL, LDLCALC, TRIG, CHOLHDL, LDLDIRECT in the last 72 hours. Thyroid Function Tests: No results for input(s): TSH, T4TOTAL, FREET4, T3FREE, THYROIDAB in the last 72 hours. Anemia Panel: Recent Labs    05/18/22 0619  FERRITIN 20*  TIBC 423  IRON 18*   Sepsis Labs: No results for input(s): PROCALCITON, LATICACIDVEN in the last 168  hours.  Recent Results (from the past 240 hour(s))  MRSA Next Gen by PCR, Nasal     Status: Abnormal   Collection Time: 05/19/22  1:38 AM   Specimen: Nasal Mucosa; Nasal Swab  Result Value Ref Range Status   MRSA by PCR Next Gen DETECTED (A) NOT DETECTED Final    Comment: (NOTE) The GeneXpert MRSA Assay (FDA approved for NASAL specimens only), is one component of a comprehensive MRSA colonization surveillance program. It is not intended to diagnose MRSA infection nor to guide or monitor treatment for MRSA infections. Test performance is not FDA approved in patients less than 62 years old. Performed at [facility], [place] [address], [place], [place] [address]          Radiology Studies: No results found.      Scheduled Meds:  busPIRone  7.5 mg Oral BID   Chlorhexidine Gluconate Cloth  6 each Topical Daily   docusate sodium  100 mg Oral BID   famotidine  40 mg Oral BID   macitentan  10 mg Oral Daily   pantoprazole (PROTONIX) IV  40 mg Intravenous Q12H   polyethylene glycol  17 g Oral BID   rosuvastatin  5 mg Oral Daily   Selexipag  200 mcg Oral QHS   And   Selexipag  400 mcg Oral Daily   sodium chloride flush  3 mL Intravenous Q12H   sucralfate  1 g Oral BID   tadalafil  20 mg Oral Daily   warfarin  2.5 mg Oral ONCE-1600   Warfarin - Pharmacist Dosing Inpatient   Does not apply q1600   Continuous Infusions:   LOS: 3 days    Time spent: 35 min    Nicolette Bang, MD [facility]  If 7PM-7AM, please contact night-coverage  05/20/2022, 1:12 PM

## 2022-05-21 DIAGNOSIS — I482 Chronic atrial fibrillation, unspecified: Secondary | ICD-10-CM | POA: Diagnosis not present

## 2022-05-21 DIAGNOSIS — Z7901 Long term (current) use of anticoagulants: Secondary | ICD-10-CM | POA: Diagnosis not present

## 2022-05-21 DIAGNOSIS — N179 Acute kidney failure, unspecified: Secondary | ICD-10-CM | POA: Diagnosis not present

## 2022-05-21 DIAGNOSIS — D649 Anemia, unspecified: Secondary | ICD-10-CM | POA: Diagnosis not present

## 2022-05-21 DIAGNOSIS — I5042 Chronic combined systolic (congestive) and diastolic (congestive) heart failure: Secondary | ICD-10-CM | POA: Diagnosis not present

## 2022-05-21 LAB — COMPREHENSIVE METABOLIC PANEL
ALT: 17 U/L (ref 0–44)
AST: 27 U/L (ref 15–41)
Albumin: 3.2 g/dL — ABNORMAL LOW (ref 3.5–5.0)
Alkaline Phosphatase: 71 U/L (ref 38–126)
Anion gap: 6 (ref 5–15)
BUN: 39 mg/dL — ABNORMAL HIGH (ref 8–23)
CO2: 26 mmol/L (ref 22–32)
Calcium: 9.5 mg/dL (ref 8.9–10.3)
Chloride: 93 mmol/L — ABNORMAL LOW (ref 98–111)
Creatinine, Ser: 1.61 mg/dL — ABNORMAL HIGH (ref 0.61–1.24)
GFR, Estimated: 44 mL/min — ABNORMAL LOW (ref >60)
Glucose, Bld: 94 mg/dL (ref 70–99)
Potassium: 4.3 mmol/L (ref 3.5–5.1)
Sodium: 125 mmol/L — ABNORMAL LOW (ref 135–145)
Total Bilirubin: 3.2 mg/dL — ABNORMAL HIGH (ref 0.3–1.2)
Total Protein: 7.1 g/dL (ref 6.5–8.1)

## 2022-05-21 LAB — CBC WITH DIFFERENTIAL/PLATELET
Abs Immature Granulocytes: 0.01 10*3/uL (ref 0.00–0.07)
Basophils Absolute: 0 10*3/uL (ref 0.0–0.1)
Basophils Relative: 1 %
Eosinophils Absolute: 0 10*3/uL (ref 0.0–0.5)
Eosinophils Relative: 0 %
HCT: 28 % — ABNORMAL LOW (ref 39.0–52.0)
Hemoglobin: 8.4 g/dL — ABNORMAL LOW (ref 13.0–17.0)
Immature Granulocytes: 0 %
Lymphocytes Relative: 8 %
Lymphs Abs: 0.3 10*3/uL — ABNORMAL LOW (ref 0.7–4.0)
MCH: 23.7 pg — ABNORMAL LOW (ref 26.0–34.0)
MCHC: 30 g/dL (ref 30.0–36.0)
MCV: 79.1 fL — ABNORMAL LOW (ref 80.0–100.0)
Monocytes Absolute: 0.5 10*3/uL (ref 0.1–1.0)
Monocytes Relative: 13 %
Neutro Abs: 2.7 10*3/uL (ref 1.7–7.7)
Neutrophils Relative %: 78 %
Platelets: 108 10*3/uL — ABNORMAL LOW (ref 150–400)
RBC: 3.54 MIL/uL — ABNORMAL LOW (ref 4.22–5.81)
RDW: 19.5 % — ABNORMAL HIGH (ref 11.5–15.5)
WBC: 3.5 10*3/uL — ABNORMAL LOW (ref 4.0–10.5)
nRBC: 0 % (ref 0.0–0.2)

## 2022-05-21 LAB — GLUCOSE, CAPILLARY
Glucose-Capillary: 101 mg/dL — ABNORMAL HIGH (ref 70–99)
Glucose-Capillary: 107 mg/dL — ABNORMAL HIGH (ref 70–99)
Glucose-Capillary: 120 mg/dL — ABNORMAL HIGH (ref 70–99)
Glucose-Capillary: 84 mg/dL (ref 70–99)
Glucose-Capillary: 92 mg/dL (ref 70–99)
Glucose-Capillary: 93 mg/dL (ref 70–99)
Glucose-Capillary: 98 mg/dL (ref 70–99)

## 2022-05-21 LAB — MAGNESIUM: Magnesium: 2.3 mg/dL (ref 1.7–2.4)

## 2022-05-21 LAB — PROTIME-INR
INR: 1.9 — ABNORMAL HIGH (ref 0.8–1.2)
Prothrombin Time: 21.7 seconds — ABNORMAL HIGH (ref 11.4–15.2)

## 2022-05-21 MED ORDER — MIDODRINE HCL 5 MG PO TABS
5.0000 mg | ORAL_TABLET | Freq: Three times a day (TID) | ORAL | Status: DC
  Administered 2022-05-21 – 2022-05-28 (×23): 5 mg via ORAL
  Filled 2022-05-21 (×25): qty 1

## 2022-05-21 MED ORDER — ENOXAPARIN SODIUM 40 MG/0.4ML IJ SOSY
40.0000 mg | PREFILLED_SYRINGE | INTRAMUSCULAR | Status: DC
  Administered 2022-05-21: 40 mg via SUBCUTANEOUS
  Filled 2022-05-21: qty 0.4

## 2022-05-21 MED ORDER — WARFARIN SODIUM 5 MG PO TABS
5.0000 mg | ORAL_TABLET | Freq: Once | ORAL | Status: AC
  Administered 2022-05-21: 5 mg via ORAL
  Filled 2022-05-21: qty 1

## 2022-05-21 MED ORDER — SODIUM CHLORIDE 0.9 % IV BOLUS
250.0000 mL | Freq: Once | INTRAVENOUS | Status: AC
  Administered 2022-05-21: 250 mL via INTRAVENOUS

## 2022-05-21 NOTE — Progress Notes (Signed)
PROGRESS NOTE    Lance Hicks  KTG:[id number] DOB: 13-Apr-1946 DOA: 05/17/2022 PCP: [name], PA-C   Brief Narrative:  76 y.o. male with medical history significant for chronic combined systolic and diastolic CHF (EF 42-87 percent, G2DD), CAD s/p CABG, chronic atrial fibrillation and aortic stenosis s/p mechanical aortic valve replacement on Coumadin, pulmonary arterial hypertension, COPD, CKD stage IIIa, chronic hypotension, hepatic cirrhosis, anemia of chronic disease and iron deficiency who was admitted from [facility] for evaluation of generalized weakness associated with acute on chronic anemia.  Patient was then seen by her PCP on 5/23 where they obtained blood work, that showed hemoglobin of 8.9 and was Hemoccult positive.  Patient was eventually transferred to [facility] long. [facility] was consulted too. At his point GI is holding on intervention due to hi-risk.  Coumadin was held but because of mech. heart valve but was restarted since no procedure planned, hgb stable, added lovenox bridge till therapuetic.   Assessment & Plan:   Principal Problem:   Acute renal failure superimposed on stage 3a chronic kidney disease (HCC) Active Problems:   Hyponatremia   Acute on chronic anemia   Coronary artery disease involving native coronary artery of native heart with angina pectoris (HCC)   [name]   Pulmonary arterial hypertension (HCC)   Hypotension, chronic   Chronic atrial fibrillation (HCC)   COPD (chronic obstructive pulmonary disease) (HCC)   Chronic combined systolic and diastolic CHF (congestive heart failure) (HCC)   Heme positive stool   Chronic anticoagulation   Gastritis   Acute renal failure superimposed on stage 3a chronic kidney disease (HCC) -Improving, creatinine today 1.6.   -Creatinine 2.38 prior to arrival with baseline 1.5-1.6.   -Appeared volume depleted on admission, received gentle hydration. -Hold  spironolactone, metformin, Farxiga -Monitor UOP   Delirium: -Patient is developing a delirium -Called neighbors at midnight and at 3 AM previous day -Frequent day night review orientation -Avoid antihistamines and benzos as possible   Hyponatremia Admission sodium 125, Mildly hypovolemic now with decreased po intake Added small bolus and will monitor uop Repeat bmp in am   Acute on chronic anemia. Microcytic Severe iron deficiency -Baseline hemoglobin 12, admission hemoglobin 8-9  now , was 7.7  FOBT positive. --BUN is elevated.   --[facility] has been consulted but no plans for acute intervention due to hi-risk,  --if becomes unstable, reconsult  --Pepcid BID. Allergy to PPI.  --IV iron ordered. --Reviewed cardiology, restarted coumadin 5/28 with pharm consult-rec'd lovenox bridge till therapeutic, 40u q24 per pharm -- currently, hgb stable in setting of mech heart valve, daily cbc   Chronic combined systolic and diastolic CHF (congestive heart failure) ([place]) -Appeared hypovolemic on admission.   -Last EF 45-50% with G2 DD on TTE 09/30/2021. -Consulted cardiology for their input and comanagement, appreciate opinion. -Holding spironolactone and Farxiga with AKI -consider restarting Monday after fluid today   COPD (chronic obstructive pulmonary disease) ([place]) -Stable without wheezing or dyspnea on admission.  Continue albuterol as needed.   Chronic atrial fibrillation (HCC) -Remains in atrial fibrillation with controlled rate.   -Not on rate controlling agents due to history of bradycardia.   -As above restarted Coumadin   ,, chronic  -still hypotensive -Has been on midodrine in the past. -Re started 5/28, 33m tid   Pulmonary arterial hypertension (HCC) -Continue home selexipag, macitentan, tadalafil.   [name] -coumadin as above, lovenox bridge -hgb stable, monitor   Coronary artery disease  involving native coronary artery of native  heart with angina pectoris ([place]) -S/p CABG.   -Denies any chest pain.   -Continue statin.       DVT prophylaxis: SCDs Start: 05/17/22 1931, coumadin restarted 5/27 Code Status:  full code    Code Status Orders  (From admission, onward)           Start     Ordered   05/17/22 1932  Full code  Continuous        05/17/22 1933           Code Status History     Date Active Date Inactive Code Status Order ID Comments User Context   01/25/2022 1641 02/05/2022 1922 DNR [id number]  Lin Landsman, NP Inpatient   01/23/2022 1818 01/25/2022 1641 Full Code [id number]  Lequita Halt, MD ED   11/03/2021 1718 11/09/2021 1727 Full Code [id number]  Consuelo Pandy, PA-C Inpatient   09/29/2021 1430 10/04/2021 2049 Full Code [id number]  Jolaine Artist, MD Inpatient   05/14/2018 1145 05/23/2018 1709 Full Code [id number]  [place], [place], PA-C Inpatient   01/18/2018 1104 01/18/2018 1529 Full Code [id number]  Bensimhon, Shaune Pascal, MD Inpatient      Advance Directive Documentation    Flowsheet Row Most Recent Value  Type of Advance Directive Healthcare Power of Attorney  Pre-existing out of facility DNR order (yellow form or pink MOST form) --  "MOST" Form in Place? --      Family Communication: discussed with friend sat, no new information  Disposition Plan:   TBD, not stable for discharge home currently.  We will need to treat delirium with resolution, correct hypotension Consults called: None Admission status: Inpatient   Consultants:  Gi cards  Procedures:  DG Chest 2 View  Result Date: 05/16/2022 CLINICAL DATA:  CHF. EXAM: CHEST - 2 VIEW COMPARISON:  02/23/2022 FINDINGS: Lungs are adequately inflated without focal airspace consolidation. Findings suggest a small amount of right pleural fluid without significant change. Minimal hazy prominence of the perihilar markings suggesting a degree of vascular congestion without significant change. Mild stable cardiomegaly. Remainder of  the exam is unchanged. IMPRESSION: Stable cardiomegaly with suggestion of minimal vascular congestion. Small amount right pleural fluid unchanged. Electronically Signed   By: Marin Olp M.D.   On: 05/16/2022 16:58   DG Abd 1 View  Result Date: 05/18/2022 CLINICAL DATA:  Abdominal pain and constipation EXAM: ABDOMEN - 1 VIEW COMPARISON:  CT scan 01/31/2022 FINDINGS: Epicardial pacer lead fragments noted along the thoracoabdominal junction. Barium outlines diverticula in the colon, primarily the descending and sigmoid colon. Atherosclerosis is present, including aortoiliac atherosclerotic disease. No dilated bowel. 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 IMPRESSION: 1. Barium outlines diverticula primarily in the descending and sigmoid colon. No substantial colonic stool burden. 2.  Aortic Atherosclerosis (ICD10-I70.0). 3. Pars defects at L5 and substantial loss of disc height at L4-5 as shown on 01/31/2022. Electronically Signed   By: Van Clines M.D.   On: 05/18/2022 10:47   DG UGI W SMALL BOWEL DOUBLE CM  Result Date: 05/03/2022 CLINICAL DATA:  Patient with past medical history of dysphagia, recent nausea/vomiting, GERD, anemia, cirrhosis, CHF, COPD, pulmonary hypertension, coronary artery disease with prior CABG and mechanical AVR, chronic respiratory failure, aortic stenosis and atrial fibrillation. History of EGD in 2018 with gastritis. EXAM: DG UGI W/ SMALL BOWEL TECHNIQUE: Scout radiograph was obtained. Combined double and single contrast examination was performed using effervescent  crystals, high-density barium and thin liquid barium. This exam was performed by APP name, and was supervised and interpreted by Rad name. FLUOROSCOPY: Radiation Exposure Index (as provided by the fluoroscopic device): 70.10 mGy Kerma COMPARISON:  CT abdomen/pelvis 01/31/22 FINDINGS: Scout Radiograph: Normal bowel gas pattern. Retained epicardial pacing wires and previous median  sternotomy noted. Aortic and branch vessel atherosclerosis. Esophagus: Normal appearance. Esophageal motility: Within normal limits for age. A single episode of aspiration into the trachea was noted. Esophageal reflux: No significant reflux elicited by provocative maneuvers Ingested 51m barium tablet: Passed normally Stomach: Mild diffuse rugal thickening without ulceration or focal mucosal abnormality. No significant hiatal hernia. Gastric emptying: Normal. Duodenum: Normal appearance. Other: Normal small bowel transit time. No small bowel wall thickening or distention identified. At subsequent fluoroscopy, no abnormality of the terminal ileum identified. IMPRESSION: 1. Single episode of silent aspiration noted. 2. Normal esophageal motility without focal mucosal abnormality. 3. Mild rugal fold thickening in the stomach as can be seen with gastritis. No evidence of ulceration. 4. The small bowel appears normal. Electronically Signed   By: WRichardean SaleM.D.   On: 05/03/2022 13:38       Subjective: Patient mildly confused Noted to be mildly hypotensive  Objective: Vitals:   05/21/22 0800 05/21/22 0844 05/21/22 0845 05/21/22 0901  BP: (!) 170/154 (!) 79/48 (!) 80/42 (!) 78/51  Pulse: 73 63 65 64  Resp: _0 Temp: (!) 97.2 F (36.2 C)     TempSrc: Oral     SpO2: 99% 98% 98% 99%  Weight:      Height:        Intake/Output Summary (Last 24 hours) at 05/21/2022 1043 Last data filed at 05/21/2022 0800 Gross per 24 hour  Intake 600 ml  Output 590 ml  Net 10 ml   Filed Weights   05/19/22 0144 05/20/22 0500 05/21/22 0500  Weight: 55.9 kg 55.9 kg 44.4 kg    Examination:  General exam: Appears calm, but confused Respiratory system: Clear to auscultation. Respiratory effort normal. Cardiovascular system: S1 & S2 heard, RRR. No JVD, murmurs, rubs, gallops or clicks. No pedal edema. Gastrointestinal system: Abdomen is nondistended, soft and nontender. No organomegaly or masses felt.  Normal bowel sounds heard. Central nervous system: Alert,  No new focal neurological deficits. Extremities: Symmetric 5 x 5 power. Skin: No rashes, lesions or ulcers Psychiatry: impaired judgement, confused, no acute decomp    Data Reviewed: I have personally reviewed following labs and imaging studies  CBC: Recent Labs  Lab 05/16/22 1650 05/17/22 1959 05/18/22 0614 05/19/22 0251 05/20/22 0248 05/21/22 0309  WBC 3.9 2.1* 1.9* 3.7* 3.8* 3.5*  NEUTROABS 2,835 1.3*  --   --  3.1 2.7  HGB 9.4* 8.6* 8.3* 7.7* 8.7* 8.4*  HCT 29.1* 27.0* 26.0* 24.9* 28.8* 28.0*  MCV 80.4 75.8* 76.0* 76.9* 78.3* 79.1*  PLT 84* 107* 98* 89* 111* 1438   Basic Metabolic Panel: Recent Labs  Lab 05/16/22 1650 05/17/22 1959 05/18/22 0614 05/19/22 0251 05/20/22 0248 05/21/22 0309  NA 125* 126* 127* 125* 123* 125*  K 4.2 3.6 3.3* 3.2* 4.3 4.3  CL 84* 86* 90* 93* 90* 93*  CO2 _1 GLUCOSE 71 79 49* 121* 96 94  BUN 63* 64* 61* 45* 42* 39*  CREATININE 2.38* 1.86* 1.72* 1.61* 1.67* 1.61*  CALCIUM 10.0 9.6 9.5 8.8* 9.2 9.5  MG 2.5  --  2.2 2.1 2.2 2.3   GFR: Estimated Creatinine  Clearance: 24.9 mL/min (A) (by C-G formula based on SCr of 1.61 mg/dL (H)). Liver Function Tests: Recent Labs  Lab 05/16/22 1650 05/17/22 1959 05/18/22 0614 05/21/22 0309  AST 28 33 29 27  ALT _0 ALKPHOS  --  74 67 71  BILITOT 3.8* 3.6* 3.3* 3.2*  PROT 8.2* 7.8 7.2 7.1  ALBUMIN  --  3.5 3.2* 3.2*   No results for input(s): LIPASE, AMYLASE in the last 168 hours. No results for input(s): AMMONIA in the last 168 hours. Coagulation Profile: Recent Labs  Lab 05/17/22 1959 05/18/22 0614 05/19/22 1047 05/20/22 0905 05/21/22 0309  INR 2.1* 2.2* 2.3* 2.0* 1.9*   Cardiac Enzymes: No results for input(s): CKTOTAL, CKMB, CKMBINDEX, TROPONINI in the last 168 hours. BNP (last 3 results) Recent Labs    07/04/21 1607 08/16/21 1620 02/23/22 1218  PROBNP 425.0* 601.0* 891.0*   HbA1C: No  results for input(s): HGBA1C in the last 72 hours. CBG: Recent Labs  Lab 05/20/22 1548 05/20/22 1952 05/21/22 0010 05/21/22 0345 05/21/22 0806  GLUCAP 105* 98 107* 92 93   Lipid Profile: No results for input(s): CHOL, HDL, LDLCALC, TRIG, CHOLHDL, LDLDIRECT in the last 72 hours. Thyroid Function Tests: No results for input(s): TSH, T4TOTAL, FREET4, T3FREE, THYROIDAB in the last 72 hours. Anemia Panel: No results for input(s): VITAMINB12, FOLATE, FERRITIN, TIBC, IRON, RETICCTPCT in the last 72 hours. Sepsis Labs: No results for input(s): PROCALCITON, LATICACIDVEN in the last 168 hours.  Recent Results (from the past 240 hour(s))  MRSA Next Gen by PCR, Nasal     Status: Abnormal   Collection Time: 05/19/22  1:38 AM   Specimen: Nasal Mucosa; Nasal Swab  Result Value Ref Range Status   MRSA by PCR Next Gen DETECTED (A) NOT DETECTED Final    Comment: (NOTE) The GeneXpert MRSA Assay (FDA approved for NASAL specimens only), is one component of a comprehensive MRSA colonization surveillance program. It is not intended to diagnose MRSA infection nor to guide or monitor treatment for MRSA infections. Test performance is not FDA approved in patients less than 61 years old. Performed at [facility], [place] [address], [place], [place] [address]          Radiology Studies: No results found.      Scheduled Meds:  busPIRone  7.5 mg Oral BID   Chlorhexidine Gluconate Cloth  6 each Topical Daily   docusate sodium  100 mg Oral BID   enoxaparin (LOVENOX) injection  40 mg Subcutaneous Q24H   famotidine  40 mg Oral BID   macitentan  10 mg Oral Daily   midodrine  5 mg Oral TID WC   pantoprazole (PROTONIX) IV  40 mg Intravenous Q12H   polyethylene glycol  17 g Oral BID   rosuvastatin  5 mg Oral Daily   Selexipag  200 mcg Oral QHS   And   Selexipag  400 mcg Oral Daily   sodium chloride flush  3 mL Intravenous Q12H   sucralfate  1 g Oral BID   tadalafil  20 mg  Oral Daily   warfarin  5 mg Oral ONCE-1600   Warfarin - Pharmacist Dosing Inpatient   Does not apply q1600   Continuous Infusions:  sodium chloride       LOS: 4 days    Time spent: 35 min    Nicolette Bang, MD [facility]  If 7PM-7AM, please contact night-coverage  05/21/2022, 10:43 AM

## 2022-05-21 NOTE — Progress Notes (Signed)
Subjective:  No issues with liquid intake Hct stable   Objective:  Vitals:   05/21/22 0400 05/21/22 0442 05/21/22 0500 05/21/22 0800  BP:      Pulse: 66 68    Resp: 12 (!) 23    Temp: (!) 97.3 F (36.3 C)   (!) 97.2 F (36.2 C)  TempSrc: Oral   Oral  SpO2: 94% (!) 82%    Weight:   44.4 kg   Height:        Intake/Output from previous day:  Intake/Output Summary (Last 24 hours) at 05/21/2022 0840 Last data filed at 05/20/2022 2000 Gross per 24 hour  Intake 800 ml  Output 390 ml  Net 410 ml    Physical Exam: Chronically ill Bruising Interstitial crackles SEM crisp S2 AVR click no AR Central abdominal scar no distension Stasis LE;s plus one edema  Lab Results: Basic Metabolic Panel: Recent Labs    05/20/22 0248 05/21/22 0309  NA 123* 125*  K 4.3 4.3  CL 90* 93*  CO2 24 26  GLUCOSE 96 94  BUN 42* 39*  CREATININE 1.67* 1.61*  CALCIUM 9.2 9.5  MG 2.2 2.3   Liver Function Tests: Recent Labs    05/21/22 0309  AST 27  ALT 17  ALKPHOS 71  BILITOT 3.2*  PROT 7.1  ALBUMIN 3.2*   No results for input(s): LIPASE, AMYLASE in the last 72 hours. CBC: Recent Labs    05/20/22 0248 05/21/22 0309  WBC 3.8* 3.5*  NEUTROABS 3.1 2.7  HGB 8.7* 8.4*  HCT 28.8* 28.0*  MCV 78.3* 79.1*  PLT 111* 108*    Anemia Panel: No results for input(s): VITAMINB12, FOLATE, FERRITIN, TIBC, IRON, RETICCTPCT in the last 72 hours.   Imaging: No results found.  Cardiac Studies:  ECG: Afib PVC   Telemetry:  afib / PVC  Echo: EF 45-50% AVR normal   Medications:    busPIRone  7.5 mg Oral BID   Chlorhexidine Gluconate Cloth  6 each Topical Daily   docusate sodium  100 mg Oral BID   enoxaparin (LOVENOX) injection  40 mg Subcutaneous Q24H   famotidine  40 mg Oral BID   macitentan  10 mg Oral Daily   midodrine  5 mg Oral TID WC   pantoprazole (PROTONIX) IV  40 mg Intravenous Q12H   polyethylene glycol  17 g Oral BID   rosuvastatin  5 mg Oral Daily   Selexipag  200  mcg Oral QHS   And   Selexipag  400 mcg Oral Daily   sodium chloride flush  3 mL Intravenous Q12H   sucralfate  1 g Oral BID   tadalafil  20 mg Oral Daily   warfarin  5 mg Oral ONCE-1600   Warfarin - Pharmacist Dosing Inpatient   Does not apply q1600      Assessment/Plan:     CHF:  he is dry ok to hydrate in setting of poor PO intake and GI bleeding EF 45-50% with RV failure and azotemia Hold diuretics He is not on ARB/ARNI due to CKD AVR:  normal exam and echo 10/22 He has not taken it since Sunday but INR Rx today at 2.3 ok to continue to hold until Hb stable or GI decides they are not going to do EGD/Colon  Suspect congestive hepatopathy responsible for "auto anticoagulation" and elevated INR desptie last coumadin dose Sunday INR 1.9 started back on coumadin no heparin overlap His INR will likely be Rx in am  Afib:  chronic rate control fine Has had MAZE and LAA clipping continue lopressor iv as needed  Pulmonary HTN:  WHO class 2/3 continue Tadalafil, Selexipag and opsumit as tolerated  Anemia:  US abdomen no ileus TBili elevated 3.3 plan per primary service and GI Hct fairly stable 28 this am  CRF:  diuretics held Cr 1.61 this am improved   Jenkins Rouge 05/21/2022, 8:40 AM

## 2022-05-21 NOTE — Progress Notes (Signed)
[facility] for Warfarin Indication: Mechanical AVR and Afib  Allergies  Allergen Reactions   Augmentin [Amoxicillin-Pot Clavulanate] Anaphylaxis and Diarrhea    "Upset stomach"   Amoxicillin     Other reaction(s): Nausea/Vomiting   Chicken Allergy Other (See Comments)    Other reaction(s): Unknown   Clavulanic Acid     Other reaction(s): Nausea/Vomiting   Pantoprazole Sodium Nausea Only    Gets gassy and starting itching like crazy Gets gassy and starting itching like crazy   Tizanidine     Other reaction(s): See Comments   Valium [Diazepam] Other (See Comments)    HA and Abd pain.   Tape Rash and Other (See Comments)    Surgical tape   Wound Dressing Adhesive Other (See Comments) and Rash    Surgical tape Surgical tape Surgical tape    Patient Measurements: Height: 5' 9" (175.3 cm) Weight: 44.4 kg (97 lb 14.2 oz) IBW/kg (Calculated) : 70.7  Vital Signs: Temp: 97.3 F (36.3 C) (05/28 0400) Temp Source: Oral (05/28 0400) BP: 79/48 (05/28 0200) Pulse Rate: 68 (05/28 0442)  Labs: Recent Labs    05/19/22 0251 05/19/22 1047 05/20/22 0248 05/20/22 0905 05/21/22 0309  HGB 7.7*  --  8.7*  --  8.4*  HCT 24.9*  --  28.8*  --  28.0*  PLT 89*  --  111*  --  108*  LABPROT  --  25.2*  --  22.5* 21.7*  INR  --  2.3*  --  2.0* 1.9*  CREATININE 1.61*  --  1.67*  --  1.61*     Estimated Creatinine Clearance: 24.9 mL/min (A) (by C-G formula based on SCr of 1.61 mg/dL (H)).  PTA warfarin dose 2.40m daily except 511mMon/Wed/Sat - last dose taken 5/22  Assessment: [age]o male with hx mechanical aortic valve and afib on chronic warfarin admitted with acute on chronic anemia.  Warfarin has been held since admission and GI following.  Pharmacy has been consulted to resume warfarin since GI is not planning any intervention at this time as he is deemed to be high risk and Hgb is now stable.  Today, 05/21/2022 INR 1.9, subtherapeutic CBC: Hgb  8.4, Plts 108 (low but overall improved) AST/ALT WNL, Tbili elevated 3.2  Goal of Therapy:  INR 2.5-3 per outpatient clinic notes   Plan:  Warfarin 23m36mO x 1 today Daily PT/INR Monitor closely for signs/symptoms of bleeding  EriPeggyann JubaharmD, BCPS Pharmacy: [phone]/2023,7:23 AM

## 2022-05-22 DIAGNOSIS — Z7901 Long term (current) use of anticoagulants: Secondary | ICD-10-CM | POA: Diagnosis not present

## 2022-05-22 DIAGNOSIS — N170 Acute kidney failure with tubular necrosis: Secondary | ICD-10-CM

## 2022-05-22 DIAGNOSIS — I482 Chronic atrial fibrillation, unspecified: Secondary | ICD-10-CM | POA: Diagnosis not present

## 2022-05-22 DIAGNOSIS — N1831 Chronic kidney disease, stage 3a: Secondary | ICD-10-CM | POA: Diagnosis not present

## 2022-05-22 DIAGNOSIS — I5042 Chronic combined systolic (congestive) and diastolic (congestive) heart failure: Secondary | ICD-10-CM | POA: Diagnosis not present

## 2022-05-22 LAB — CBC WITH DIFFERENTIAL/PLATELET
Abs Immature Granulocytes: 0.01 10*3/uL (ref 0.00–0.07)
Basophils Absolute: 0 10*3/uL (ref 0.0–0.1)
Basophils Relative: 1 %
Eosinophils Absolute: 0 10*3/uL (ref 0.0–0.5)
Eosinophils Relative: 0 %
HCT: 27.1 % — ABNORMAL LOW (ref 39.0–52.0)
Hemoglobin: 8.6 g/dL — ABNORMAL LOW (ref 13.0–17.0)
Immature Granulocytes: 0 %
Lymphocytes Relative: 7 %
Lymphs Abs: 0.3 10*3/uL — ABNORMAL LOW (ref 0.7–4.0)
MCH: 24.6 pg — ABNORMAL LOW (ref 26.0–34.0)
MCHC: 31.7 g/dL (ref 30.0–36.0)
MCV: 77.7 fL — ABNORMAL LOW (ref 80.0–100.0)
Monocytes Absolute: 0.5 10*3/uL (ref 0.1–1.0)
Monocytes Relative: 13 %
Neutro Abs: 2.9 10*3/uL (ref 1.7–7.7)
Neutrophils Relative %: 79 %
Platelets: 117 10*3/uL — ABNORMAL LOW (ref 150–400)
RBC: 3.49 MIL/uL — ABNORMAL LOW (ref 4.22–5.81)
RDW: 19.9 % — ABNORMAL HIGH (ref 11.5–15.5)
WBC: 3.7 10*3/uL — ABNORMAL LOW (ref 4.0–10.5)
nRBC: 0 % (ref 0.0–0.2)

## 2022-05-22 LAB — GLUCOSE, CAPILLARY
Glucose-Capillary: 104 mg/dL — ABNORMAL HIGH (ref 70–99)
Glucose-Capillary: 128 mg/dL — ABNORMAL HIGH (ref 70–99)
Glucose-Capillary: 84 mg/dL (ref 70–99)
Glucose-Capillary: 85 mg/dL (ref 70–99)
Glucose-Capillary: 89 mg/dL (ref 70–99)
Glucose-Capillary: 93 mg/dL (ref 70–99)

## 2022-05-22 LAB — BASIC METABOLIC PANEL
Anion gap: 6 (ref 5–15)
BUN: 39 mg/dL — ABNORMAL HIGH (ref 8–23)
CO2: 26 mmol/L (ref 22–32)
Calcium: 9.6 mg/dL (ref 8.9–10.3)
Chloride: 94 mmol/L — ABNORMAL LOW (ref 98–111)
Creatinine, Ser: 1.72 mg/dL — ABNORMAL HIGH (ref 0.61–1.24)
GFR, Estimated: 41 mL/min — ABNORMAL LOW (ref >60)
Glucose, Bld: 81 mg/dL (ref 70–99)
Potassium: 4.5 mmol/L (ref 3.5–5.1)
Sodium: 126 mmol/L — ABNORMAL LOW (ref 135–145)

## 2022-05-22 LAB — PROTIME-INR
INR: 2.3 — ABNORMAL HIGH (ref 0.8–1.2)
Prothrombin Time: 25.1 seconds — ABNORMAL HIGH (ref 11.4–15.2)

## 2022-05-22 LAB — MAGNESIUM: Magnesium: 2.1 mg/dL (ref 1.7–2.4)

## 2022-05-22 MED ORDER — SODIUM CHLORIDE 1 G PO TABS
1.0000 g | ORAL_TABLET | Freq: Two times a day (BID) | ORAL | Status: AC
  Administered 2022-05-22 – 2022-05-28 (×14): 1 g via ORAL
  Filled 2022-05-22 (×14): qty 1

## 2022-05-22 MED ORDER — ENOXAPARIN SODIUM 30 MG/0.3ML IJ SOSY
30.0000 mg | PREFILLED_SYRINGE | INTRAMUSCULAR | Status: DC
  Administered 2022-05-22: 30 mg via SUBCUTANEOUS
  Filled 2022-05-22 (×2): qty 0.3

## 2022-05-22 MED ORDER — FAMOTIDINE 20 MG PO TABS
40.0000 mg | ORAL_TABLET | Freq: Every day | ORAL | Status: DC
  Administered 2022-05-22 – 2022-05-28 (×6): 40 mg via ORAL
  Filled 2022-05-22 (×6): qty 2

## 2022-05-22 MED ORDER — WARFARIN SODIUM 2.5 MG PO TABS
2.5000 mg | ORAL_TABLET | Freq: Once | ORAL | Status: AC
  Administered 2022-05-22: 2.5 mg via ORAL
  Filled 2022-05-22: qty 1

## 2022-05-22 NOTE — Progress Notes (Addendum)
Lance Hicks for Warfarin Indication: Mechanical AVR and Afib  Allergies  Allergen Reactions   Augmentin [Amoxicillin-Pot Clavulanate] Anaphylaxis and Diarrhea    "Upset stomach"   Amoxicillin     Other reaction(s): Nausea/Vomiting   Chicken Allergy Other (See Comments)    Other reaction(s): Unknown   Clavulanic Acid     Other reaction(s): Nausea/Vomiting   Pantoprazole Sodium Nausea Only    Gets gassy and starting itching like crazy Gets gassy and starting itching like crazy   Tizanidine     Other reaction(s): See Comments   Valium [Diazepam] Other (See Comments)    HA and Abd pain.   Tape Rash and Other (See Comments)    Surgical tape   Wound Dressing Adhesive Other (See Comments) and Rash    Surgical tape Surgical tape Surgical tape    Patient Measurements: Height: 5' 9" (175.3 cm) Weight: 44.4 kg (97 lb 14.2 oz) IBW/kg (Calculated) : 70.7  Vital Signs: Temp: 97.5 F (36.4 C) (05/29 0700) Temp Source: Oral (05/29 0700) BP: 84/42 (05/29 0600) Pulse Rate: 60 (05/29 0600)  Labs: Recent Labs    05/20/22 0248 05/20/22 0905 05/21/22 0309 05/22/22 0300  HGB 8.7*  --  8.4* 8.6*  HCT 28.8*  --  28.0* 27.1*  PLT 111*  --  108* 117*  LABPROT  --  22.5* 21.7* 25.1*  INR  --  2.0* 1.9* 2.3*  CREATININE 1.67*  --  1.61* 1.72*     Estimated Creatinine Clearance: 23.3 mL/min (A) (by C-G formula based on SCr of 1.72 mg/dL (H)).  PTA warfarin dose 2.35m daily except 573mMon/Wed/Sat - last dose taken 5/22  Assessment: [age]o male with hx mechanical aortic valve and afib on chronic warfarin admitted with acute on chronic anemia.  Warfarin has been held since admission and GI following.  Pharmacy has been consulted to resume warfarin since GI is not planning any intervention at this time as he is deemed to be high risk and Hgb is now stable.  Today, 05/22/2022 INR 1.9> 2.3, below 2.5 - 3  CBC: Hgb 8.6, Plts 117 (low but overall  improved) AST/ALT WNL, Tbili elevated 3.2 Scr 1.72  Goal of Therapy:  INR 2.5-3 per outpatient clinic notes   Plan:  Decrease LMWH 40 mg to 30 mg for CrCl < 30 ml/min Will DC LMWH when INR > 2.5 Warfarin 2.5 mg PO x 1 today Daily PT/INR Monitor closely for signs/symptoms of bleeding Decrease pepcid from 40 bid to 40 qday for CrCl < 30 ml/min  MiEudelia BunchPharm.D 05/22/2022 7:21 AM

## 2022-05-22 NOTE — TOC Progression Note (Signed)
Transition of Care [facility]) - Progression Note   Patient Details  Name: Lance Hicks MRN: [id number] Date of Birth: 08-04-1946  [facility] CM/SW [place], LCSW Phone Number: 05/22/2022, 10:48 AM  Clinical Narrative: [facility] consulted for HH/DME needs. CSW spoke with patient's HCPOA, [name]. Per Mr. Wynetta Emery, patient lives alone and was independent with ADLs at baseline until 1-2 weeks ago when patient was showing signs of increased weakness and eating less at home. Patient has a cane at home, but this is not frequently used. Although patient has a history of HH with [facility], patient is not active with [facility] services at this time. Mr. Wynetta Emery reported that he checks on the patient regularly, but "won't be able to look after him" unless patient's strength improves enough for him to go home independently.  Expected Discharge Plan: Home/Self Care Barriers to Discharge: Continued Medical Work up  Expected Discharge Plan and Services Expected Discharge Plan: Home/Self Care Discharge Planning Services: CM Consult Living arrangements for the past 2 months: Single Family Home  Readmission Risk Interventions    05/22/2022   10:34 AM  Readmission Risk Prevention Plan  Medication Review (RN Care Manager) Complete  HRI or [place] Complete  SW Recovery Care/Counseling Consult Complete  Palliative Care Screening Not [place] Not Applicable

## 2022-05-22 NOTE — Progress Notes (Signed)
Subjective:  No issues with liquid intake Hct  drifting down again Has been OOB but not really walking Still dry  Objective:  Vitals:   05/22/22 0436 05/22/22 0500 05/22/22 0600 05/22/22 0700  BP:  (!) 95/50 (!) 84/42 104/63  Pulse:  60 60 75  Resp:  _0 Temp:    (!) 97.5 F (36.4 C)  TempSrc:    Oral  SpO2:  100% 100% 100%  Weight: 44.4 kg     Height:        Intake/Output from previous day:  Intake/Output Summary (Last 24 hours) at 05/22/2022 0846 Last data filed at 05/22/2022 0400 Gross per 24 hour  Intake 150 ml  Output 550 ml  Net -400 ml    Physical Exam: Chronically ill Bruising Interstitial crackles SEM crisp S2 AVR click no AR Central abdominal scar no distension Stasis LE;s plus one edema  Lab Results: Basic Metabolic Panel: Recent Labs    05/21/22 0309 05/22/22 0300  NA 125* 126*  K 4.3 4.5  CL 93* 94*  CO2 26 26  GLUCOSE 94 81  BUN 39* 39*  CREATININE 1.61* 1.72*  CALCIUM 9.5 9.6  MG 2.3 2.1   Liver Function Tests: Recent Labs    05/21/22 0309  AST 27  ALT 17  ALKPHOS 71  BILITOT 3.2*  PROT 7.1  ALBUMIN 3.2*   No results for input(s): LIPASE, AMYLASE in the last 72 hours. CBC: Recent Labs    05/21/22 0309 05/22/22 0300  WBC 3.5* 3.7*  NEUTROABS 2.7 2.9  HGB 8.4* 8.6*  HCT 28.0* 27.1*  MCV 79.1* 77.7*  PLT 108* 117*    Anemia Panel: No results for input(s): VITAMINB12, FOLATE, FERRITIN, TIBC, IRON, RETICCTPCT in the last 72 hours.   Imaging: No results found.  Cardiac Studies:  ECG: Afib PVC   Telemetry:  afib / PVC  Echo: EF 45-50% AVR normal   Medications:    busPIRone  7.5 mg Oral BID   Chlorhexidine Gluconate Cloth  6 each Topical Daily   docusate sodium  100 mg Oral BID   enoxaparin (LOVENOX) injection  30 mg Subcutaneous Q24H   famotidine  40 mg Oral QHS   macitentan  10 mg Oral Daily   midodrine  5 mg Oral TID WC   pantoprazole (PROTONIX) IV  40 mg Intravenous Q12H   polyethylene glycol  17  g Oral BID   rosuvastatin  5 mg Oral Daily   Selexipag  200 mcg Oral QHS   And   Selexipag  400 mcg Oral Daily   sodium chloride flush  3 mL Intravenous Q12H   sodium chloride  1 g Oral BID WC   sucralfate  1 g Oral BID   tadalafil  20 mg Oral Daily   warfarin  2.5 mg Oral ONCE-1600   Warfarin - Pharmacist Dosing Inpatient   Does not apply q1600      Assessment/Plan:     CHF:  he is dry ok to hydrate in setting of poor PO intake and GI bleeding EF 45-50% with RV failure and azotemia Hold diuretics He is not on ARB/ARNI due to CKD AVR:  normal exam and echo 10/22 He has not taken it since Sunday but INR Rx today at 2.3  Likely will need low dose to maintain INR due to congestive hepatopathy Afib:  chronic rate control fine Has had MAZE and LAA clipping continue lopressor iv as needed  Pulmonary HTN:  WHO  class 2/3 continue Tadalafil, Selexipag and opsumit as tolerated  Anemia:  US abdomen no ileus TBili elevated 3.3 plan per primary service and GI Hct fairly stable 27 this am  CRF:  diuretics held Cr 1.72 this am   Overall prognosis appears poor   Jenkins Rouge 05/22/2022, 8:46 AM

## 2022-05-22 NOTE — Progress Notes (Signed)
PROGRESS NOTE  Lance Hicks YIR:[id number] DOB: August 18, 1946 DOA: 05/17/2022 PCP: [name], PA-C  HPI/Recap of past 24 hours:  76 y.o. male with medical history significant for chronic combined systolic and diastolic CHF (EF 50-09 percent, G2DD), CAD s/p CABG, chronic atrial fibrillation on Coumadin, aortic stenosis s/p mechanical aortic valve replacement, pulmonary arterial hypertension, COPD, CKD stage IIIa, chronic hypotension on midodrine, hepatic cirrhosis, anemia of chronic disease and iron deficiency, who was initially admitted from [facility] for evaluation of generalized weakness associated with acute on chronic anemia.  Patient was then seen by his PCP on 5/23 where they obtained blood work, that showed hemoglobin of 8.9 with Hemoccult positive.  Transferred to [place] [facility] and seen by [employer]  Coumadin was initially held but because of mech. heart valve, was restarted since no procedure planned.  Coumadin is managed and dosed by pharmacy.  05/22/22: Seen and examined at his bedside.  Diffuse bruising noted in the setting of thrombocytopenia.  He has no new complaints.  Denies having any pain.  Appetite continues to be poor.  Assessment/Plan: Principal Problem:   Acute renal failure superimposed on stage 3a chronic kidney disease (HCC) Active Problems:   Hyponatremia   Acute on chronic anemia   Coronary artery disease involving native coronary artery of native heart with angina pectoris (HCC)   [name]   Pulmonary arterial hypertension (HCC)   Hypotension, chronic   Chronic atrial fibrillation (HCC)   COPD (chronic obstructive pulmonary disease) (HCC)   Chronic combined systolic and diastolic CHF (congestive heart failure) (HCC)   Heme positive stool   Chronic anticoagulation   Gastritis  AKI on CKD 3A Baseline creatinine appears to be 1.47 with GFR 50 Presented with creatinine of 2.38 with GFR 37. Creatinine is uptrending  today 1.72 from 1.61 yesterday. Not on IV fluid, home diuretics held, torsemide and spironolactone. Encourage oral hydration. Continue to avoid nephrotoxic agents and hypotension. Monitor urine output with strict I's and O's. Repeat renal panel in the morning.  Resolved acute metabolic encephalopathy Likely in the setting of acute illness Mentation appears to be back to baseline. Continue to treat underlying conditions Reorient as needed Delirium/fall precautions Avoid benzodiazepines and antihistamines which can worsen delirium.   Chronic hyponatremia  Presented with sodium of 125 Uptrending this morning 126. Encourage oral intake. Trial of salt tablet 1 g twice daily x7 days  Severe protein calorie malnutrition/failure to thrive in an adult BMI 14 Severe muscle mass loss Encourage increase oral protein calorie intake Dietary consult   Acute on chronic anemia/microcytic Severe iron deficiency/concern for upper GI bleed -Baseline hemoglobin 12 Presented with hemoglobin of 9.4 with positive FOBT and elevated BUN. Was seen by GI [place], no plan for acute intervention due to high risk Hemoglobin plateaued at 8.6 from 8.4 yesterday Currently on twice daily Pepcid.  Allergic to PPI. The patient has received IV iron. Currently on Coumadin, restarted on 528 due to mechanical heart valve. Repeat CBC in the morning  Chronic combined systolic and diastolic CHF (congestive heart failure) ([place]) -Appeared hypovolemic on admission.   -Last EF 45-50% with G2 DD on TTE 09/30/2021. Cardiology following, appreciate assistance. Home spironolactone and torsemide on hold due to AKI and soft BPs. Closely monitor volume status while off diuretics The patient is on medical management for pulmonary hypertension. Strict I's and O's and daily weight  Pulmonary arterial hypertension WHO class II/III Continue tadalafil, selexipag, and Opsumit as tolerated, per cardiology recommendation  Chronic atrial fibrillation (HCC) -Remains in atrial fibrillation with controlled rate.   -Not on rate controlling agents due to history of bradycardia.   On Coumadin for CVA prevention  [name] Coumadin, dosed by pharmacy. INR goal between 2.5 and 3.  Subtherapeutic INR INR 2.3 INR goal between 2.5 and 3. Home Coumadin dosed by pharmacy Appreciate pharmacy's assistance.  Chronic hypotension, on midodrine Continue home midodrine 5 mg 3 times daily Continue to maintain MAP greater than 65. Continue to closely monitor vital signs.  Coronary artery disease involving native coronary artery of native heart with angina pectoris ([place]) -S/p CABG.   No reported anginal symptoms. Continue home statin, Crestor 5 mg daily No antiplatelets due to high risk for bleeding, on Coumadin.   COPD (chronic obstructive pulmonary disease) (HCC) -Stable without wheezing or dyspnea on admission.  Continue albuterol as needed. Currently on 3 L with O2 saturation of 100%.  Isolated elevated T bilirubin Last total bilirubin 3.2, downtrending from 3.6 on 05/17/2022. Continue to monitor   Critical care time: 65 minutes.    Code Status: Full code  Family Communication: None at bedside  Disposition Plan: Likely will discharge to home with home health service support.   Consultants: Cardiology GI  Procedures: None.  Antimicrobials: None.  DVT prophylaxis: Coumadin  Status is: Inpatient Inpatient status.  Patient requires at least 2 midnights for further evaluation and treatment of present condition.    Objective: Vitals:   05/22/22 0436 05/22/22 0500 05/22/22 0600 05/22/22 0700  BP:  (!) 95/50 (!) 84/42 104/63  Pulse:  60 60 75  Resp:  _0 Temp:    (!) 97.5 F (36.4 C)  TempSrc:    Oral  SpO2:  100% 100% 100%  Weight: 44.4 kg     Height:        Intake/Output Summary (Last 24 hours) at 05/22/2022 0726 Last data filed at 05/22/2022  0400 Gross per 24 hour  Intake 150 ml  Output 950 ml  Net -800 ml   Filed Weights   05/20/22 0500 05/21/22 0500 05/22/22 0436  Weight: 55.9 kg 44.4 kg 44.4 kg    Exam:  General: 76 y.o. year-old male frail-appearing in no acute distress.  Alert and oriented x3. Cardiovascular: Irregular rate and rhythm with no rubs or gallops.  No thyromegaly or JVD noted.   Respiratory: Clear to auscultation with no wheezes or rales.  Poor inspiratory effort. Abdomen: Soft nontender nondistended with normal bowel sounds x4 quadrants. Musculoskeletal: No lower extremity edema. 2/4 pulses in all 4 extremities. Skin: Bruising diffusely. Psychiatry: Mood is appropriate for condition and setting   Data Reviewed: CBC: Recent Labs  Lab 05/16/22 1650 05/17/22 1959 05/18/22 0614 05/19/22 0251 05/20/22 0248 05/21/22 0309 05/22/22 0300  WBC 3.9 2.1* 1.9* 3.7* 3.8* 3.5* 3.7*  NEUTROABS 2,835 1.3*  --   --  3.1 2.7 2.9  HGB 9.4* 8.6* 8.3* 7.7* 8.7* 8.4* 8.6*  HCT 29.1* 27.0* 26.0* 24.9* 28.8* 28.0* 27.1*  MCV 80.4 75.8* 76.0* 76.9* 78.3* 79.1* 77.7*  PLT 84* 107* 98* 89* 111* 108* 353*   Basic Metabolic Panel: Recent Labs  Lab 05/18/22 0614 05/19/22 0251 05/20/22 0248 05/21/22 0309 05/22/22 0300  NA 127* 125* 123* 125* 126*  K 3.3* 3.2* 4.3 4.3 4.5  CL 90* 93* 90* 93* 94*  CO2 _1 GLUCOSE 49* 121* 96 94 81  BUN 61* 45* 42* 39* 39*  CREATININE 1.72* 1.61* 1.67*  1.61* 1.72*  CALCIUM 9.5 8.8* 9.2 9.5 9.6  MG 2.2 2.1 2.2 2.3 2.1   GFR: Estimated Creatinine Clearance: 23.3 mL/min (A) (by C-G formula based on SCr of 1.72 mg/dL (H)). Liver Function Tests: Recent Labs  Lab 05/16/22 1650 05/17/22 1959 05/18/22 0614 05/21/22 0309  AST 28 33 29 27  ALT _0 ALKPHOS  --  74 67 71  BILITOT 3.8* 3.6* 3.3* 3.2*  PROT 8.2* 7.8 7.2 7.1  ALBUMIN  --  3.5 3.2* 3.2*   No results for input(s): LIPASE, AMYLASE in the last 168 hours. No results for input(s): AMMONIA in the  last 168 hours. Coagulation Profile: Recent Labs  Lab 05/18/22 0614 05/19/22 1047 05/20/22 0905 05/21/22 0309 05/22/22 0300  INR 2.2* 2.3* 2.0* 1.9* 2.3*   Cardiac Enzymes: No results for input(s): CKTOTAL, CKMB, CKMBINDEX, TROPONINI in the last 168 hours. BNP (last 3 results) Recent Labs    07/04/21 1607 08/16/21 1620 02/23/22 1218  PROBNP 425.0* 601.0* 891.0*   HbA1C: No results for input(s): HGBA1C in the last 72 hours. CBG: Recent Labs  Lab 05/21/22 1131 05/21/22 1606 05/21/22 1950 05/21/22 2353 05/22/22 0351  GLUCAP 101* 120* 98 84 85   Lipid Profile: No results for input(s): CHOL, HDL, LDLCALC, TRIG, CHOLHDL, LDLDIRECT in the last 72 hours. Thyroid Function Tests: No results for input(s): TSH, T4TOTAL, FREET4, T3FREE, THYROIDAB in the last 72 hours. Anemia Panel: No results for input(s): VITAMINB12, FOLATE, FERRITIN, TIBC, IRON, RETICCTPCT in the last 72 hours. Urine analysis:    Component Value Date/Time   COLORURINE YELLOW 05/18/2022 0003   APPEARANCEUR CLEAR 05/18/2022 0003   LABSPEC 1.009 05/18/2022 0003   PHURINE 6.0 05/18/2022 0003   GLUCOSEU 150 (A) 05/18/2022 0003   HGBUR NEGATIVE 05/18/2022 0003   BILIRUBINUR NEGATIVE 05/18/2022 0003   BILIRUBINUR negative 06/17/2021 1521   KETONESUR NEGATIVE 05/18/2022 0003   PROTEINUR NEGATIVE 05/18/2022 0003   UROBILINOGEN 0.2 06/17/2021 1521   NITRITE NEGATIVE 05/18/2022 0003   LEUKOCYTESUR NEGATIVE 05/18/2022 0003   Sepsis Labs: _1 (procalcitonin:4,lacticidven:4)  ) Recent Results (from the past 240 hour(s))  MRSA Next Gen by PCR, Nasal     Status: Abnormal   Collection Time: 05/19/22  1:38 AM   Specimen: Nasal Mucosa; Nasal Swab  Result Value Ref Range Status   MRSA by PCR Next Gen DETECTED (A) NOT DETECTED Final    Comment: (NOTE) The GeneXpert MRSA Assay (FDA approved for NASAL specimens only), is one component of a comprehensive MRSA colonization surveillance program. It is not  intended to diagnose MRSA infection nor to guide or monitor treatment for MRSA infections. Test performance is not FDA approved in patients less than 14 years old. Performed at [facility], [place] [address], [place], [place] [address]       Studies: No results found.  Scheduled Meds:  busPIRone  7.5 mg Oral BID   Chlorhexidine Gluconate Cloth  6 each Topical Daily   docusate sodium  100 mg Oral BID   enoxaparin (LOVENOX) injection  30 mg Subcutaneous Q24H   famotidine  40 mg Oral BID   macitentan  10 mg Oral Daily   midodrine  5 mg Oral TID WC   pantoprazole (PROTONIX) IV  40 mg Intravenous Q12H   polyethylene glycol  17 g Oral BID   rosuvastatin  5 mg Oral Daily   Selexipag  200 mcg Oral QHS   And   Selexipag  400 mcg Oral Daily  sodium chloride flush  3 mL Intravenous Q12H   sucralfate  1 g Oral BID   tadalafil  20 mg Oral Daily   warfarin  2.5 mg Oral ONCE-1600   Warfarin - Pharmacist Dosing Inpatient   Does not apply q1600    Continuous Infusions:   LOS: 5 days     Kayleen Memos, MD [facility] Pager [phone]  If 7PM-7AM, please contact night-coverage [email or web address] Password [facility] 05/22/2022, 7:26 AM

## 2022-05-23 ENCOUNTER — Inpatient Hospital Stay (HOSPITAL_COMMUNITY): Payer: Medicare Other

## 2022-05-23 DIAGNOSIS — Z7901 Long term (current) use of anticoagulants: Secondary | ICD-10-CM | POA: Diagnosis not present

## 2022-05-23 DIAGNOSIS — N1831 Chronic kidney disease, stage 3a: Secondary | ICD-10-CM | POA: Diagnosis not present

## 2022-05-23 DIAGNOSIS — I482 Chronic atrial fibrillation, unspecified: Secondary | ICD-10-CM | POA: Diagnosis not present

## 2022-05-23 DIAGNOSIS — Z952 Presence of prosthetic heart valve: Secondary | ICD-10-CM | POA: Diagnosis not present

## 2022-05-23 DIAGNOSIS — I5042 Chronic combined systolic (congestive) and diastolic (congestive) heart failure: Secondary | ICD-10-CM | POA: Diagnosis not present

## 2022-05-23 DIAGNOSIS — N170 Acute kidney failure with tubular necrosis: Secondary | ICD-10-CM | POA: Diagnosis not present

## 2022-05-23 LAB — CBC WITH DIFFERENTIAL/PLATELET
Abs Immature Granulocytes: 0.01 10*3/uL (ref 0.00–0.07)
Basophils Absolute: 0 10*3/uL (ref 0.0–0.1)
Basophils Relative: 0 %
Eosinophils Absolute: 0 10*3/uL (ref 0.0–0.5)
Eosinophils Relative: 1 %
HCT: 27.6 % — ABNORMAL LOW (ref 39.0–52.0)
Hemoglobin: 8.6 g/dL — ABNORMAL LOW (ref 13.0–17.0)
Immature Granulocytes: 0 %
Lymphocytes Relative: 9 %
Lymphs Abs: 0.3 10*3/uL — ABNORMAL LOW (ref 0.7–4.0)
MCH: 24.2 pg — ABNORMAL LOW (ref 26.0–34.0)
MCHC: 31.2 g/dL (ref 30.0–36.0)
MCV: 77.5 fL — ABNORMAL LOW (ref 80.0–100.0)
Monocytes Absolute: 0.6 10*3/uL (ref 0.1–1.0)
Monocytes Relative: 16 %
Neutro Abs: 2.5 10*3/uL (ref 1.7–7.7)
Neutrophils Relative %: 74 %
Platelets: 110 10*3/uL — ABNORMAL LOW (ref 150–400)
RBC: 3.56 MIL/uL — ABNORMAL LOW (ref 4.22–5.81)
RDW: 20.6 % — ABNORMAL HIGH (ref 11.5–15.5)
WBC: 3.5 10*3/uL — ABNORMAL LOW (ref 4.0–10.5)
nRBC: 0 % (ref 0.0–0.2)

## 2022-05-23 LAB — BASIC METABOLIC PANEL
Anion gap: 9 (ref 5–15)
BUN: 36 mg/dL — ABNORMAL HIGH (ref 8–23)
CO2: 23 mmol/L (ref 22–32)
Calcium: 9.4 mg/dL (ref 8.9–10.3)
Chloride: 93 mmol/L — ABNORMAL LOW (ref 98–111)
Creatinine, Ser: 1.51 mg/dL — ABNORMAL HIGH (ref 0.61–1.24)
GFR, Estimated: 48 mL/min — ABNORMAL LOW (ref >60)
Glucose, Bld: 71 mg/dL (ref 70–99)
Potassium: 4.7 mmol/L (ref 3.5–5.1)
Sodium: 125 mmol/L — ABNORMAL LOW (ref 135–145)

## 2022-05-23 LAB — GLUCOSE, CAPILLARY
Glucose-Capillary: 87 mg/dL (ref 70–99)
Glucose-Capillary: 82 mg/dL (ref 70–99)
Glucose-Capillary: 126 mg/dL — ABNORMAL HIGH (ref 70–99)
Glucose-Capillary: 127 mg/dL — ABNORMAL HIGH (ref 70–99)
Glucose-Capillary: 113 mg/dL — ABNORMAL HIGH (ref 70–99)
Glucose-Capillary: 93 mg/dL (ref 70–99)

## 2022-05-23 LAB — PROTIME-INR
INR: 3.2 — ABNORMAL HIGH (ref 0.8–1.2)
Prothrombin Time: 32.4 seconds — ABNORMAL HIGH (ref 11.4–15.2)

## 2022-05-23 LAB — MAGNESIUM: Magnesium: 2 mg/dL (ref 1.7–2.4)

## 2022-05-23 LAB — PHOSPHORUS: Phosphorus: 2.6 mg/dL (ref 2.5–4.6)

## 2022-05-23 MED ORDER — ORAL CARE MOUTH RINSE
15.0000 mL | Freq: Two times a day (BID) | OROMUCOSAL | Status: DC
  Administered 2022-05-23 – 2022-05-28 (×11): 15 mL via OROMUCOSAL

## 2022-05-23 MED ORDER — SALINE SPRAY 0.65 % NA SOLN
1.0000 | NASAL | Status: DC | PRN
  Filled 2022-05-23: qty 44

## 2022-05-23 MED ORDER — MUPIROCIN 2 % EX OINT
1.0000 "application " | TOPICAL_OINTMENT | Freq: Two times a day (BID) | CUTANEOUS | Status: AC
  Administered 2022-05-23 – 2022-05-27 (×10): 1 via NASAL
  Filled 2022-05-23 (×5): qty 22

## 2022-05-23 MED ORDER — ENSURE ENLIVE PO LIQD
237.0000 mL | Freq: Two times a day (BID) | ORAL | Status: DC
Start: 2022-05-23 — End: 2022-05-29
  Administered 2022-05-24 – 2022-05-28 (×10): 237 mL via ORAL

## 2022-05-23 MED ORDER — WARFARIN SODIUM 1 MG PO TABS
1.0000 mg | ORAL_TABLET | Freq: Once | ORAL | Status: AC
  Administered 2022-05-23: 1 mg via ORAL
  Filled 2022-05-23: qty 1

## 2022-05-23 MED ORDER — ADULT MULTIVITAMIN W/MINERALS CH
1.0000 | ORAL_TABLET | Freq: Every day | ORAL | Status: DC
Start: 2022-05-23 — End: 2022-05-29
  Administered 2022-05-23 – 2022-05-28 (×6): 1 via ORAL
  Filled 2022-05-23 (×7): qty 1

## 2022-05-23 MED ORDER — PROSOURCE PLUS PO LIQD
30.0000 mL | Freq: Every day | ORAL | Status: DC
  Administered 2022-05-23 – 2022-05-28 (×6): 30 mL via ORAL
  Filled 2022-05-23 (×6): qty 30

## 2022-05-23 NOTE — Progress Notes (Signed)
Patient has been given thin liquid for PO intake and continues to cough after each sip of water. The patient is encouraged to tuck his chin when swallowing and to cough after each time he swallows. Concerns for aspiration with thin liquid intake. Will request swallow eval for this patient.

## 2022-05-23 NOTE — Progress Notes (Signed)
Initial Nutrition Assessment  DOCUMENTATION CODES:   Severe malnutrition in context of chronic illness  INTERVENTION:  - will order Ensure Plus High Protein BID, each supplement provides 350 kcal and 20 grams of protein. - will order 30 ml Prosource Plus once/day, each supplement provides 100 kcal and 15 grams protein.  - will order 1 tablet multivitamin with minerals/day.   NUTRITION DIAGNOSIS:   Severe Malnutrition related to chronic illness as evidenced by moderate muscle depletion, severe fat depletion, severe muscle depletion.  GOAL:   Patient will meet greater than or equal to 90% of their needs  MONITOR:   PO intake, Supplement acceptance, Labs, Weight trends  REASON FOR ASSESSMENT:   Consult Assessment of nutrition requirement/status  ASSESSMENT:   76 y.o. male with medical history of chronic combined systolic and diastolic CHF, CAD s/p CABG, chronic afib on Coumadin, aortic stenosis s/p mechanical aortic valve replacement, pulmonary arterial HTN, COPD, stage 3 CKD, chronic hypotension on midodrine, hepatic cirrhosis, anemia of chronic disease and iron deficiency. He was admitted from [facility] due to generalized weakness with acute on chronic anemia. He was seen by PCP on 5/23 and Hgb on that date was 8.9 and he was hemoccult positive. He was transferred to [facility] and seen by GI. He was admitted for ARF on stage 3 CKD.  Patient sitting up in bed with pastor at bedside. Patient was last seen by a [place] RD in 01/2022 at which time he had shared that he does not have a big appetite at baseline and does not typically eat 3 meals/day. He also reported severe acid issues.  RN's note earlier this AM indicates patient coughing with thin liquids. SLP arrived for bedside swallow evaluation at the end of RD visit.   With RD and SLP in the room, patient shares that he has a chronic cough that is sometimes productive and that this has been ongoing since he had COVID  in 12/2021. He reported to RD prior to SLP arrival that he coughs d/t allergies and that coughing occurs with and without PO intakes.   He denies any issues with chewing or swallowing at baseline and denies any pain with oral intakes.   Patient shares that outpatient MD prescribed medication 2 weeks ago for reflux and that this has been very helpful and also seems to make cough improved.   Patient lives alone but shares that he has many family and friends who check on him often. He sometimes cooks. He denies any difficulties with ambulation, no dizziness or lightheadedness with standing. He does have a cane accessible to him in his home.  Patient feels that he has been gaining weight over the past few months. He denies noting changes in his physical appearance and denies any changes in the way his clothes fit.  Weight today is documented as 129 lb. Weight on admission date of 5/24 was documented as 115 lb. Weight on 5/23 was documented as 108 lb. Weight noted to be stable 02/05/22-04/14/22 (115-120 lb).    Labs reviewed; CBGs: 87 and 82 mg/dl, Na: 125 mmol/l, Cl: 93 mmol/l, BUN: 36 mg/dl, creatinine: 1.51 mg/dl, GFR: 48 ml/min.  Medications reviewed; 100 mg colace BID, 40 mg oral pepcid/day, 40 mg IV protonix BID, 17 g miralax BID, 1 g NaCl BID, 1 g carafate BID.    NUTRITION - FOCUSED PHYSICAL EXAM:  Flowsheet Row Most Recent Value  Orbital Region Severe depletion  Upper Arm Region Severe depletion  Thoracic and Lumbar Region  Unable to assess  Buccal Region Severe depletion  Temple Region Moderate depletion  Clavicle Bone Region Severe depletion  Clavicle and Acromion Bone Region Severe depletion  Scapular Bone Region Severe depletion  Dorsal Hand Moderate depletion  Patellar Region Mild depletion  Anterior Thigh Region Mild depletion  Posterior Calf Region Mild depletion  Edema (RD Assessment) Mild  [BLE]  Hair Reviewed  Eyes Reviewed  Mouth Reviewed  Skin Reviewed  Nails  Reviewed       Diet Order:   Diet Order             DIET DYS 3 Room service appropriate? Yes; Fluid consistency: Thin  Diet effective now                   EDUCATION NEEDS:   Not appropriate for education at this time  Skin:  Skin Assessment: Reviewed RN Assessment  Last BM:  5/28 (type 7 x1, large amount)  Height:   Ht Readings from Last 1 Encounters:  05/19/22 _0  (1.753 m)    Weight:   Wt Readings from Last 1 Encounters:  05/23/22 58.7 kg     BMI:  Body mass index is 19.11 kg/m.  Estimated Nutritional Needs:  Kcal:  1780-2000 kcal Protein:  90-100 grams Fluid:  >/= 1.7 L/day     Jarome Matin, MS, RD, LDN Registered Dietitian II Inpatient Clinical Nutrition RD pager # and on-call/weekend pager # available in [facility]

## 2022-05-23 NOTE — Progress Notes (Signed)
    OVERNIGHT PROGRESS REPORT  Notified by RN that patient may want to discuss his code status. Bedside visit with patient yielded no change in Code Status - Full Code  Patient is oriented to Month/ Year/ Name/ Location. Discussed with him his desire to be DNR or Full Code.  Patient responded multiple times that he wishes to remain FULL Code.  Order for Code status remains intact as FULL Code.  Gershon Cull MSNA MSN ACNPC-AG Acute Care Nurse Practitioner [place]

## 2022-05-23 NOTE — Progress Notes (Signed)
[place] for Warfarin Indication: Mechanical AVR and Afib  Allergies  Allergen Reactions   Augmentin [Amoxicillin-Pot Clavulanate] Anaphylaxis and Diarrhea    "Upset stomach"   Amoxicillin     Other reaction(s): Nausea/Vomiting   Chicken Allergy Other (See Comments)    Other reaction(s): Unknown   Clavulanic Acid     Other reaction(s): Nausea/Vomiting   Pantoprazole Sodium Nausea Only    Gets gassy and starting itching like crazy Gets gassy and starting itching like crazy   Tizanidine     Other reaction(s): See Comments   Valium [Diazepam] Other (See Comments)    HA and Abd pain.   Tape Rash and Other (See Comments)    Surgical tape   Wound Dressing Adhesive Other (See Comments) and Rash    Surgical tape Surgical tape Surgical tape    Patient Measurements: Height: _0  (175.3 cm) Weight: 58.7 kg (129 lb 6.6 oz) IBW/kg (Calculated) : 70.7  Vital Signs: Temp: 97.5 F (36.4 C) (05/30 0800) Temp Source: Oral (05/30 0800) BP: 104/53 (05/30 1000) Pulse Rate: 64 (05/30 1000)  Labs: Recent Labs    05/21/22 0309 05/22/22 0300 05/23/22 0233  HGB 8.4* 8.6* 8.6*  HCT 28.0* 27.1* 27.6*  PLT 108* 117* 110*  LABPROT 21.7* 25.1* 32.4*  INR 1.9* 2.3* 3.2*  CREATININE 1.61* 1.72* 1.51*     Estimated Creatinine Clearance: 35.1 mL/min (A) (by C-G formula based on SCr of 1.51 mg/dL (H)).  PTA warfarin dose 2.55m daily except 535mMon/Wed/Sat - last dose taken 5/22  Assessment: [age]o male with hx mechanical aortic valve and afib on chronic warfarin admitted with acute on chronic anemia.  Warfarin has been held since admission and GI following.  Pharmacy has been consulted to resume warfarin since GI is not planning any intervention at this time as he is deemed to be high risk and Hgb is now stable.  Today, 05/23/2022 INR with large increase to 3.2, supratherapeutic after only 3 doses.  CBC: Hgb low/stable at 8.6, Plts 110 (low but  improved from 80-90s on admit) AST/ALT WNL, Tbili elevated 3.2 (last on 5/28) Scr down to 1.51 Meal intake remains low 25-35% of meals  Goal of Therapy:  INR 2.5-3 per outpatient clinic notes   Plan:  D/C Enoxaparin for INR > 2.5  Warfarin 1 mg PO x 1 today  Daily PT/INR Monitor closely for signs/symptoms of bleeding  ChGretta ArabharmD, BCPS Clinical Pharmacist [facility] main pharmacy [phone]/30/2023 10:30 AM

## 2022-05-23 NOTE — Progress Notes (Signed)
MBS completed, report to follow after full review of study.   Pt given limited barium including thin, nectar and single bolus of puree.  Moderate oral dysphagia characterized by weakness - causing pt to expend significant effort to elicit oral transiting with delay.  Severe pharyngeal dysphagia evidenced by poor pharyngeal motility resulting in retention throughout pharynx with all consistencies that mixed with secretions.  Retention did not clear despite liquid swallow and cued dry swallows *cued dry swallows inconsistently conducted and appeared to fatigue pt.  Cued "hock" was not produced despite demonstration from SLP x2 and thus pt did not clear vallecular retention.  Aspiration of nectar observed due to poor motility and did not clear despite reflexive cough.  HOB reclined and head turn right/left did not prevent or facilitate pharyngeal clearance. Following MBS, pt admitted to chronic problems swallowing.   Pt will aspirate regardless of consistency he consumes and retention is worse with increased visocity.  Discussed with pt severity of his dysphagia with aspiration (Of even secretions).  He advised that he NOT want to be a full code.     Recommend consider palliative consult to establish [place] given pt's deconditioning and level of dysphagia.  Recommend NPO x ice chips and change medications to IV (that can be changed).  PO Intake for comfort with accepted risks may align with pt's goals.    Kathleen Lime, MS [facility] SLP Acute Rehab Services Office [phone] Pager [phone]

## 2022-05-23 NOTE — Progress Notes (Signed)
Progress Note  Patient Name: Lance Hicks Date of Encounter: 05/23/2022  [facility] Cardiologist: None   Subjective   Patient denies any chest pain, worsening sob, palpitations. Continues to have decreased appetite and weakness. Per RN report, patient coughs when drinking thin liquids and may need swallow evaluation   Inpatient Medications    Scheduled Meds:  busPIRone  7.5 mg Oral BID   Chlorhexidine Gluconate Cloth  6 each Topical Daily   docusate sodium  100 mg Oral BID   enoxaparin (LOVENOX) injection  30 mg Subcutaneous Q24H   famotidine  40 mg Oral QHS   macitentan  10 mg Oral Daily   midodrine  5 mg Oral TID WC   pantoprazole (PROTONIX) IV  40 mg Intravenous Q12H   polyethylene glycol  17 g Oral BID   rosuvastatin  5 mg Oral Daily   Selexipag  200 mcg Oral QHS   And   Selexipag  400 mcg Oral Daily   sodium chloride flush  3 mL Intravenous Q12H   sodium chloride  1 g Oral BID WC   sucralfate  1 g Oral BID   tadalafil  20 mg Oral Daily   Warfarin - Pharmacist Dosing Inpatient   Does not apply q1600   Continuous Infusions:  PRN Meds: acetaminophen **OR** acetaminophen, hydrALAZINE, ipratropium-albuterol, metoprolol tartrate, ondansetron **OR** ondansetron (ZOFRAN) IV, senna-docusate, traZODone   Vital Signs    Vitals:   05/23/22 0400 05/23/22 0500 05/23/22 0600 05/23/22 0700  BP: (!) 93/56 (!) 94/47 (!) 79/50 (!) 93/52  Pulse: 66 70 (!) 57 67  Resp: 19 (!) _0 Temp: (!) 97.5 F (36.4 C)     TempSrc: Oral     SpO2: 94% 95% 90% 93%  Weight:  58.7 kg    Height:        Intake/Output Summary (Last 24 hours) at 05/23/2022 0741 Last data filed at 05/23/2022 0700 Gross per 24 hour  Intake 10 ml  Output 800 ml  Net -790 ml      05/23/2022    5:00 AM 05/22/2022    4:36 AM 05/21/2022    5:00 AM  Last 3 Weights  Weight (lbs) 129 lb 6.6 oz 97 lb 14.2 oz 97 lb 14.2 oz  Weight (kg) 58.7 kg 44.4 kg 44.4 kg      Telemetry    Atrial fibrillation,  HR in the 60s - Personally Reviewed  ECG    No new tracings since 5/25 - Personally Reviewed  Physical Exam   GEN: Frail, chronically ill elderly gentleman sitting comfortably in the bed wearing [place] with 2 L/min Neck: No JVD Cardiac: Irregular rate and rhythm, mechanical S2 click. Grade 2/6 systolic murmur heard throughout  Respiratory: Clear to auscultation bilaterally. GI: Soft, nontender, non-distended  MS: Trace edema; No deformity. Neuro:  Nonfocal  Psych: Normal affect   Labs    High Sensitivity Troponin:  No results for input(s): TROPONINIHS in the last 720 hours.   Chemistry Recent Labs  Lab 05/17/22 1959 05/18/22 7893 05/19/22 0251 05/21/22 0309 05/22/22 0300 05/23/22 0233  NA 126* 127*   < > 125* 126* 125*  K 3.6 3.3*   < > 4.3 4.5 4.7  CL 86* 90*   < > 93* 94* 93*  CO2 26 26   < > _1 GLUCOSE 79 49*   < > 94 81 71  BUN 64* 61*   < > 39* 39* 36*  CREATININE 1.86*  1.72*   < > 1.61* 1.72* 1.51*  CALCIUM 9.6 9.5   < > 9.5 9.6 9.4  MG  --  2.2   < > 2.3 2.1 2.0  PROT 7.8 7.2  --  7.1  --   --   ALBUMIN 3.5 3.2*  --  3.2*  --   --   AST 33 29  --  27  --   --   ALT 21 18  --  17  --   --   ALKPHOS 74 67  --  71  --   --   BILITOT 3.6* 3.3*  --  3.2*  --   --   GFRNONAA 37* 41*   < > 44* 41* 48*  ANIONGAP 14 11   < > _0 < > = values in this interval not displayed.    Lipids No results for input(s): CHOL, TRIG, HDL, LABVLDL, LDLCALC, CHOLHDL in the last 168 hours.  Hematology Recent Labs  Lab 05/21/22 0309 05/22/22 0300 05/23/22 0233  WBC 3.5* 3.7* 3.5*  RBC 3.54* 3.49* 3.56*  HGB 8.4* 8.6* 8.6*  HCT 28.0* 27.1* 27.6*  MCV 79.1* 77.7* 77.5*  MCH 23.7* 24.6* 24.2*  MCHC 30.0 31.7 31.2  RDW 19.5* 19.9* 20.6*  PLT 108* 117* 110*   Thyroid No results for input(s): TSH, FREET4 in the last 168 hours.  BNP Recent Labs  Lab 05/16/22 1650  BNP CANCELED    DDimer No results for input(s): DDIMER in the last 168 hours.   Radiology    No  results found.  Cardiac Studies   Echocardiogram 09/30/21  1. Left ventricular ejection fraction, by estimation, is 45 to 50%. The  left ventricle has mildly decreased function. The left ventricle  demonstrates global hypokinesis. There is mild left ventricular  hypertrophy. Left ventricular diastolic parameters  are consistent with Grade II diastolic dysfunction (pseudonormalization).   2. Right ventricular systolic function is low normal. The right  ventricular size is mildly enlarged.   3. Left atrial size was mildly dilated.   4. Right atrial size was severely dilated.   5. The mitral valve is normal in structure. No evidence of mitral valve  regurgitation. No evidence of mitral stenosis. Severe mitral annular  calcification.   6. Tricuspid valve regurgitation is moderate to severe.   7. The aortic valve has been repaired/replaced. Aortic valve  regurgitation is not visualized. No aortic stenosis is present. There is a  [employer] bileaflet valve present in the aortic position. Procedure  Date: 2010. Echo findings are consistent with  normal structure and function of the aortic valve prosthesis. Aortic valve  mean gradient measures 9.7 mmHg. Aortic valve Vmax measures 2.01 m/s.   8. There is Moderate (Grade III) plaque involving the ascending aorta.   9. The inferior vena cava is dilated in size with <50% respiratory  variability, suggesting right atrial pressure of 15 mmHg.   Patient Profile     76 y.o. male with a past medical history significant for CHF (followed closely by CHF clinic), chronic atrial fibrillation, mechanical AVR on coumadin, CABG in 2010, COPD, ILD, pulmonary HTN, CKD stage IIIa, chronic hypotension on midodrine, hepatic cirrhosis. Patient was transferred for [facility] for malaise, fatigue, poor PO intake, and possible GI bleed with hemoglobin 8.3.   Assessment & Plan    Chronic combined systolic/diastolic heart failure  - Most recent echocardiogram from  09/2021 showed EF 45-50%, grade II diastolic dysfunction. Patient is followed closely  by the CHF clinic - Appeared hypovolemic on admission in the setting of poor PO intake, somewhat improved after IV fluids but continues to be dry  - Patient is not on BB due to history of bradycardia/pauses. Not on ACE/ARB/ARNI due to CKD - Spironolactone and torsemide held as patient hypvolemic and has AKI/CKD  - On midodrine for BP support   S/p AVR  - Echo from 09/2021 showed normal structure and function of aortic valve prosthesis  - Chronically on warfarin-- dose managed per pharmacy this admission, appreciate their assistance  - Goal INR 2.5-3, INR was 3.2 this morning  - Suspect congestive hepatopathy causes some degree of "auto anticoagulation" and will affect INR   Chronic Atrial fibrillation  - s/p MAZE and LAA clipping  - Rate is well controlled off of rate controlling medications-- per telemetry, HR is consistently in the 60s   - Continue warfarin as above  - PRN lopressor IV for elevated HR   Pulmonary HTN  - WHO class 2/3 - Continue Tadalafil, selexipag, and opsumit as tolerated   AKI on CKD stage IIIa  - Baseline creatinine around 1.47. Presented with creatinine 2.38, now improved to 1.51  - Holding home torsemide, spironolactone  - Was given IV fluids earlier this admission, currently not receiving IV hydration - Encouraged oral intake - Continue to avoid nephrotoxic medications   CAD s/p CABG 2010  - Denies any chest pain - Continue statin  - Not on BB due to bradycardia, not on ASA due to coumadin use   Otherwise per primary  - Acute metabolic encephalopathy (resolved)  - Chronic hyponatremia  - Failure to thrive in an adult - Acute on chronic anemia, severe iron deficiency  - COPD        For questions or updates, please contact [facility] Please consult [email or web address] for contact info under        Signed, Margie Billet, PA-C  05/23/2022, 7:41 AM

## 2022-05-23 NOTE — Evaluation (Signed)
Physical Therapy Evaluation Patient Details Name: Lance Hicks MRN: [id number] DOB: August 15, 1946 Today's Date: 05/23/2022  History of Present Illness  76 y.o. male with medical history significant for chronic combined systolic and diastolic CHF (EF 55-73 percent, G2DD), CAD s/p CABG, chronic atrial fibrillation on Coumadin, aortic stenosis s/p mechanical aortic valve replacement, pulmonary arterial hypertension, COPD, CKD stage IIIa, chronic hypotension on midodrine, hepatic cirrhosis, anemia of chronic disease and iron deficiency, Admitted 05/17/22 from [facility]  for evaluation of generalized weakness associated with acute on chronic anemia.  Clinical Impression  Patient agreeable  to mobilizing and sitting on bed edge. Patient has appointment for MBS soon. Patient presents with weakness . Patent lives alone with neighbors checking on him 4x's per day per patient.  BP sitting 94/54, supine 101/57.HR remained in 70's.  Patient sat on bed edge x ~ 5 minutes. Pt admitted with above diagnosis.  Pt currently with functional limitations due to the deficits listed below (see PT Problem List). Pt will benefit from skilled PT to increase their independence and safety with mobility to allow discharge to the venue listed below.        Recommendations for follow up therapy are one component of a multi-disciplinary discharge planning process, led by the attending physician.  Recommendations may be updated based on patient status, additional functional criteria and insurance authorization.  Follow Up Recommendations Skilled nursing-short term rehab (<3 hours/day)    Assistance Recommended at Discharge    Patient can return home with the following  A little help with walking and/or transfers;A little help with bathing/dressing/bathroom;A lot of help with bathing/dressing/bathroom;Assist for transportation;Help with stairs or ramp for entrance    Equipment Recommendations None recommended by PT   Recommendations for Other Services       Functional Status Assessment Patient has had a recent decline in their functional status and demonstrates the ability to make significant improvements in function in a reasonable and predictable amount of time.     Precautions / Restrictions Precautions Precautions: Fall Precaution Comments: thrombocytopenia, cachectic, bruising easily      Mobility  Bed Mobility Overal bed mobility: Needs Assistance Bed Mobility: Supine to Sit, Sit to Supine     Supine to sit: Min assist Sit to supine: Mod assist   General bed mobility comments: patient able to move to sitting with min assist at trunk, mod assist for legs back onto bed.    Transfers-                   General transfer comment: NT    Ambulation/Gait                  Stairs            Wheelchair Mobility    Modified Rankin (Stroke Patients Only)       Balance Overall balance assessment: Needs assistance Sitting-balance support: Bilateral upper extremity supported, Feet unsupported Sitting balance-Leahy Scale: Fair                                       Pertinent Vitals/Pain Pain Assessment Pain Assessment: No/denies pain    Home Living Family/patient expects to be discharged to:: Private residence Living Arrangements: Alone Available Help at Discharge: Friend(s);Available PRN/intermittently Type of Home: House Home Access: Stairs to enter Entrance Stairs-Rails: Can reach both Entrance Stairs-Number of Steps: 1   Home Layout: One level Home  Equipment: Conservation officer, nature (2 wheels);Grab bars - tub/shower;Toilet riser      Prior Function Prior Level of Function : Needs assist;Independent/Modified Independent             Mobility Comments: requires help with his animals and housework normally, states neighbors check on him 4 x's/day       Hand Dominance   Dominant Hand: Right    Extremity/Trunk Assessment   Upper  Extremity Assessment Upper Extremity Assessment: Generalized weakness    Lower Extremity Assessment Lower Extremity Assessment: Generalized weakness    Cervical / Trunk Assessment Cervical / Trunk Assessment: Kyphotic  Communication   Communication: No difficulties  Cognition Arousal/Alertness: Awake/alert Behavior During Therapy: WFL for tasks assessed/performed Overall Cognitive Status: Impaired/Different from baseline Area of Impairment: Orientation                 Orientation Level: Time, Situation, Place             General Comments: initially stated hospital then stated he was in the parking lot        General Comments      Exercises     Assessment/Plan    PT Assessment Patient needs continued PT services  PT Problem List Decreased strength;Decreased balance;Decreased cognition;Decreased knowledge of precautions;Decreased mobility;Decreased activity tolerance;Cardiopulmonary status limiting activity       PT Treatment Interventions DME instruction;Therapeutic activities;Therapeutic exercise;Gait training;Patient/family education;Functional mobility training    PT Goals (Current goals can be found in the Care Plan section)  Acute Rehab PT Goals Patient Stated Goal: agreed to sitting on bed edge PT Goal Formulation: With patient Time For Goal Achievement: 06/06/22 Potential to Achieve Goals: Fair    Frequency Min 2X/week     Co-evaluation               AM-PAC PT "6 Clicks" Mobility  Outcome Measure Help needed turning from your back to your side while in a flat bed without using bedrails?: A Little Help needed moving from lying on your back to sitting on the side of a flat bed without using bedrails?: A Little Help needed moving to and from a bed to a chair (including a wheelchair)?: Total Help needed standing up from a chair using your arms (e.g., wheelchair or bedside chair)?: Total Help needed to walk in hospital room?: Total Help  needed climbing 3-5 steps with a railing? : Total 6 Click Score: 10    End of Session   Activity Tolerance: Patient limited by fatigue Patient left: in bed;with call bell/phone within reach;with bed alarm set Nurse Communication: Mobility status PT Visit Diagnosis: Unsteadiness on feet (R26.81);Difficulty in walking, not elsewhere classified (R26.2);Adult, failure to thrive (R62.7)    Time: 6067-7034 PT Time Calculation (min) (ACUTE ONLY): 21 min   Charges:   PT Evaluation $PT Eval Low Complexity: [place] PT Acute Rehabilitation Services Pager [phone] Office [phone]   Claretha Cooper 05/23/2022, 2:13 PM

## 2022-05-23 NOTE — Progress Notes (Signed)
Modified Barium Swallow Progress Note  Patient Details  Name: Lance Hicks MRN: [id number] Date of Birth: 07-25-1946  Today's Date: 05/23/2022  Modified Barium Swallow completed.  Full report located under Chart Review in the Imaging Section.  Brief recommendations include the following:  Clinical Impression  Pt given limited barium including thin, nectar and single bolus of puree.  Moderate oral dysphagia characterized by weakness - causing pt to expend significant effort to elicit oral transiting with delay. He tends to dip head forward to help elicit a swallow and admits it requires effort.   Severe pharyngeal dysphagia evidenced by poor pharyngeal motility resulting in retention throughout pharynx with all consistencies that mixed with secretions.  Retention did not clear despite liquid swallow and cued dry swallows *cued dry swallows inconsistently conducted and appeared to fatigue pt.  Cued "hock" was not produced despite demonstration from SLP x2 and thus pt did not clear vallecular retention.  Aspiration of nectar observed due to poor motility and did not clear despite reflexive cough.  HOB reclined and head turn right/left did not prevent or facilitate pharyngeal clearance. Following MBS, pt admitted to chronic problems swallowing.   Pt will aspirate regardless of consistency he consumes and retention is worse with increased visocity.     Discussed with pt severity of his dysphagia with aspiration (Of even secretions).  He advised that he NOT want to be a full code.        Recommend consider palliative consult to establish [place] given pt's deconditioning and level of dysphagia.  Recommend NPO x ice chips and change medications to IV (that can be changed).  PO Intake for comfort with accepted risks may align with pt's goals.  Prognosis for swallow function to return is guarded given chronicity of and severity of dysphagia.   Swallow Evaluation Recommendations       SLP Diet  Recommendations: Ice chips PRN after oral care       Medication Administration: Via alternative means       Compensations: Slow rate;Small sips/bites (multiple swallows per bolus and if reflexively coughing -  cough to clear and rest)       Oral Care Recommendations: Oral care BID   Other Recommendations: Have oral suction available  Kathleen Lime, MS [facility] SLP Acute Rehab Services Office [phone] Pager [phone]   Macario Golds 05/23/2022,4:01 PM

## 2022-05-23 NOTE — Evaluation (Signed)
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 In light of concurrent left nephrolithiasis, will check CT renal colic next year instead of renal US.    Anticoagulated on Coumadin 01/04/2018   Anxiety 05/10/2016   Last Assessment & Plan:  Doing well off of zoloft.   Asthma    Asymptomatic microscopic hematuria 06/20/2017   Last Assessment & Plan:  Had hematuria workup in [facility] in 2016 which negative CT and cystoscopy. UA with 2+ blood last visit - we discussed recommendation for repeat workup at 5 years or if degree of hematuria progresses.    Atrial fibrillation ([place]) 03/29/2017   Atrial flutter ([place]) 03/29/2017   Backache 12/14/2015   Last Assessment & Plan:  Pain management referral for further evaluation.   Bilateral pleural effusion 08/09/2017   CHF (congestive heart failure) (HCC)    Chronic allergic rhinitis 04/28/2016   Last Assessment & Plan:  Continue astelin   Chronic anticoagulation 03/29/2017   Chronic atrial fibrillation ([place]) 07/23/2015   Last Assessment & Plan:  Coumadin and metoprolol, cardiology referral to establish care.   Chronic midline back pain 12/14/2015   Last Assessment & Plan:  Pain management referral for further evaluation.   Chronic obstructive lung disease ([place]) 06/12/2016   With hypoxia   Chronic prostatitis 07/23/2015   Last Assessment & Plan:  Has largely resolved since stopping bike riding. Recommend annual DRE AND PSA - will see back 12/2015 for annual screening, given 1st degree fhx. To call  office for recurrent prostatitis symptoms.    Chronic respiratory failure with hypoxia ([place]) 07/29/2019   Cirrhosis of liver not due to alcohol ([place]) 07/01/2019   COPD (chronic obstructive pulmonary disease) ([place]) 06/12/2016   Coronary arteriosclerosis in native artery 03/29/2017   Coronary artery disease involving native coronary artery of native heart with angina pectoris ([place]) 03/29/2017   Cough 10/17/2016   Last Assessment & Plan:  Discussed typical course for acute viral illness. If symptoms worsen or fail to improve by 7-10d, delayed ATBs, fluids, rest, NSAIDs/APAP prn. Seek care if not improving. Needs earlier INR check due to ATBs.   Dyspnea 02/01/2016   Last Assessment & Plan:  Overall improving, eval by pulm, plan for CT, neg stress test with cardiology. Recent switch to carvedilol due to side effects.   Encounter for therapeutic drug monitoring 01/06/2019   Enterococcal bacteremia    Epidermoid cyst of skin 08/24/2017   Essential hypertension 12/14/2015   Last Assessment & Plan:  Hypertension control: controlled  Medications: compliant Medication Management: as noted in orders Home blood pressure monitoring recommended additionally as needed for symptoms  The patient's care plan was reviewed and updated. Instructions and counseling were provided regarding patient goals and barriers. He was counseled to adopt a healthy lifestyle. Educational resources and self-management tools have been provided as charted in [facility] list.    H/O maze procedure 03/29/2017   [name] 03/29/2017   Overview:  2011   History of coronary artery bypass graft 03/29/2017  Hx of CABG 03/29/2017   Hyperlipidemia 03/29/2017   Hypersensitivity angiitis ([place]) 10/01/2017   Hypertensive heart disease 03/29/2017   Hypertensive heart disease with heart failure ([place]) 03/29/2017   Hypertensive heart failure ([place]) 03/29/2017   Hypokalemia due to excessive renal loss of potassium 02/18/2018   Hypotension, chronic  06/06/2018   International normalized ratio (INR) raised 07/26/2017   Kidney stone 07/23/2015   Kidney stones 07/23/2015   Overview:  x 3  Last Assessment & Plan:  By [place] has left nephrolithiasis, but not visible by KUB. Will check CT renal colic next year to assess both stone burden as well as to surveil AML.    Left ureteral stone 01/23/2018   Leukocytoclastic vasculitis ([place]) 10/01/2017   Localized edema 01/04/2018   Long term (current) use of anticoagulants 03/29/2017   Lumbar radicular pain 01/19/2016   Lumbar radiculopathy 01/19/2016   Maculopapular rash 09/03/2017   Microscopic hematuria 06/20/2017   Last Assessment & Plan:  Had hematuria workup in [facility] in 2016 which negative CT and cystoscopy. UA with 2+ blood last visit - we discussed recommendation for repeat workup at 5 years or if degree of hematuria progresses.    Multiple nodules of lung 06/12/2016   Nasal discharge 02/25/2016   Last Assessment & Plan:  Trial zyrtec and flonase   Nephrolithiasis 07/23/2015   Overview:  x 3  Last Assessment & Plan:  By [place] has left nephrolithiasis, but not visible by KUB. Will check CT renal colic next year to assess both stone burden as well as to surveil AML.   Overview:  x 3  Last Assessment & Plan:  Has 71m nonobstructing LUP stone - not visible by KUB.  Will check renal [place]2019 - he will contact office sooner if symptomatic.    Non-sustained ventricular tachycardia ([place] 03/29/2017   Nonsustained ventricular tachycardia ([place] 03/29/2017   Other hyperlipidemia 03/29/2017   Palpitations 10/01/2017   Pleural effusion, bilateral 08/09/2017   Pneumonia due to COVID-19 virus 02/07/2021   Post-nasal drainage 02/25/2016   Last Assessment & Plan:  Trial zyrtec and flonase   Prostate cancer screening 06/20/2017   Last Assessment & Plan:  Recommend continued annual CaP screening until within 10 years of life expectancy. Given good health and fhx of longevity, would anticipate CaP screening to continue until age 75  PSA today and  again in one year on day of visit.   Pulmonary arterial hypertension ([place] 02/20/2018   Pulmonary edema    Pulmonary hypertension ([place] 08/09/2017   Pulmonary nodules 06/12/2016   S/P AVR 03/20/2016   S/P AVR (aortic valve replacement) 03/20/2016   Strain of deltoid muscle, initial encounter    Supratherapeutic INR 07/26/2017   Syncope and collapse 02/01/2016   Typical atrial flutter ([place] 02/01/2016   Past Surgical History:  Past Surgical History:  Procedure Laterality Date   CHOLECYSTECTOMY     CORONARY ARTERY BYPASS GRAFT     EXPLORATORY LAPAROTOMY  07/30/2017   FOOT SURGERY     FRACTURE SURGERY Right    wrist and forearm   HERNIA REPAIR     MECHANICAL AORTIC VALVE REPLACEMENT     NASAL SINUS SURGERY     RIGHT HEART CATH N/A 01/18/2018   Procedure: RIGHT HEART CATH;  Surgeon: BJolaine Artist MD;  [facility];  Service: Cardiovascular;  Laterality: N/A;   RIGHT HEART CATH N/A 05/14/2018   Procedure: RIGHT HEART CATH;  Surgeon: BJolaine Artist MD;  [facility]  [facility];  Service: Cardiovascular;  Laterality: N/A;   RIGHT HEART CATH N/A 09/29/2021   Procedure: RIGHT HEART CATH;  Surgeon: Jolaine Artist, MD;  Location: [place] [facility];  Service: Cardiovascular;  Laterality: N/A;   TEE WITHOUT CARDIOVERSION N/A 05/21/2018   Procedure: TRANSESOPHAGEAL ECHOCARDIOGRAM (TEE);  Surgeon: Jolaine Artist, MD;  Location: [facility] ENDOSCOPY;  Service: Cardiovascular;  Laterality: N/A;   UPPER GASTROINTESTINAL ENDOSCOPY  07/12/2017   Patchy areas of mucosal inflammation noted in the antrum with edema,erthema and ulcerations. Bx. Chronicfocally active gastritis.   VASECTOMY     HPI:  Pt is a 76 yo male transferred from [place] with AKI and CKD3, hyponatremia, acute on chronic anemia.  Has history of dysphagia,  recent nausea/vomiting, GERD, anemia, cirrhosis, CHF, COPD,  pulmonary hypertension, coronary artery disease with prior CABG and  mechanical AVR, chronic  respiratory failure, aortic stenosis and  atrial fibrillation. History of EGD in 2018 with gastritis. Hgb stable, pt on coumadin.  Hospital coarse compicated by delirium.  RN noted pt coughing with intake of liquids and SLP swallow eval thus ordered.  Per notes in chart, pt also has FTT.  UGI study 5/10 showed aspiration episode x1 per radiologist note -- SLP reviewed flouro loop and pt also had pharyngeal retention.  Pt reports h/o sensing retention in throat and esophagus and chronic cough since he had COVID this year.   He does endorse improvement with being on reflux medications.  Has had pna twice since COVID per pt.  Eats 2 meals a day.  Denies need for heimlich manuever.    Assessment / Plan / Recommendation  Clinical Impression  Patient presents with clinical indications concerning for pharyngeal retention and aspiration *review of UGI loops conducted showing aspiration.  Pt reports more issues with solids than liquids.  Baseline cough observed - which is productive at times but cough is more pronounced after liquid consumption.  Pt denies coughing associated with intake causing SLP to suspect he had assimilated cough into his life with impaired awareness.  He only consumed a single peach * he declined to consume more solids* and few boluses of nectar and thin liquids.  Swallow clinically judged to be delayed with concern for airway infiltration c/b throat clearing and cough post-swallow.   Recommend mbs due to concern for aspiration to allow instrumental evaluation and determine if any postures/consistencies would be tolerated better without aspiration.  Pt agreeable to plan. SLP Visit Diagnosis: Dysphagia, oropharyngeal phase (R13.12)    Aspiration Risk  Moderate aspiration risk;Risk for inadequate nutrition/hydration    Diet Recommendation     Medication Administration: Whole meds with puree Supervision: Patient able to self feed Compensations: Slow rate;Small sips/bites (multiple swallows  per bolus and if reflexively coughing -  cough to clear and rest) Postural Changes: Seated upright at 90 degrees;Remain upright for at least 30 minutes after po intake    Other  Recommendations Oral Care Recommendations: Oral care BID    Recommendations for follow up therapy are one component of a multi-disciplinary discharge planning process, led by the attending physician.  Recommendations may be updated based on patient status, additional functional criteria and insurance authorization.  Follow up Recommendations        Assistance Recommended at Discharge    Functional Status Assessment    Frequency and Duration            Prognosis        Swallow Study   General Date of Onset: 05/23/22 HPI:  Pt is a 76 yo male transferred from [place] with AKI and CKD3, hyponatremia, acute on chronic anemia.  Has history of dysphagia,  recent nausea/vomiting, GERD, anemia, cirrhosis, CHF, COPD,  pulmonary hypertension, coronary artery disease with prior CABG and  mechanical AVR, chronic respiratory failure, aortic stenosis and  atrial fibrillation. History of EGD in 2018 with gastritis. Hgb stable, pt on coumadin.  Hospital coarse compicated by delirium.  RN noted pt coughing with intake of liquids and SLP swallow eval thus ordered.  Per notes in chart, pt also has FTT.  UGI study 5/10 showed aspiration episode x1 per radiologist note -- SLP reviewed flouro loop and pt also had pharyngeal retention.  Pt reports h/o sensing retention in throat and esophagus and chronic cough since he had COVID this year.   He does endorse improvement with being on reflux medications.  Has had pna twice since COVID per pt.  Eats 2 meals a day.  Denies need for heimlich manuever. Type of Study: Bedside Swallow Evaluation Previous Swallow Assessment: UGI see HPI Diet Prior to this Study: Dysphagia 3 (soft);Thin liquids Temperature Spikes Noted: No Respiratory Status: Nasal cannula History of Recent Intubation:  No Behavior/Cognition: Alert;Cooperative;Pleasant mood Oral Cavity Assessment: Dry Oral Care Completed by SLP: No Oral Cavity - Dentition: Adequate natural dentition Vision: Functional for self-feeding Self-Feeding Abilities: Able to feed self Patient Positioning: Upright in bed Baseline Vocal Quality: Hoarse Volitional Cough: Weak Volitional Swallow: Able to elicit (with significant effort)    Oral/Motor/Sensory Function Overall Oral Motor/Sensory Function: Other (comment) (? minimal facial asymmetry)   Ice Chips Ice chips: Within functional limits   Thin Liquid Thin Liquid: Impaired Presentation: Self Fed;Cup;Spoon Pharyngeal  Phase Impairments: Suspected delayed Swallow;Decreased hyoid-laryngeal movement;Throat Clearing - Immediate;Cough - Delayed;Multiple swallows    Nectar Thick Nectar Thick Liquid: Impaired Presentation: Spoon;Cup Pharyngeal Phase Impairments: Decreased hyoid-laryngeal movement;Throat Clearing - Immediate;Multiple swallows   Honey Thick Honey Thick Liquid: Not tested   Puree Puree: Not tested   Solid     Solid: Within functional limits Other Comments: pt consumed a single peach only - no indications of dysphagia      Macario Golds 05/23/2022,12:27 PM  Kathleen Lime, MS [facility] SLP Acute Rehab Services Office [phone] Pager [phone]

## 2022-05-23 NOTE — Progress Notes (Signed)
Progress Note   Patient: Lance Hicks UDJ:[id number] DOB: October 22, 1946 DOA: 05/17/2022     6 DOS: the patient was seen and examined on 05/23/2022   Brief hospital course: Lance Hicks is a 76 y.o. male with medical history significant for chronic combined systolic and diastolic CHF (EF 58-85 percent, G2DD), CAD s/p CABG, chronic atrial fibrillation and aortic stenosis s/p mechanical aortic valve replacement on Coumadin, pulmonary arterial hypertension, COPD, CKD stage IIIa, chronic hypotension, hepatic cirrhosis, anemia of chronic disease and iron deficiency who was admitted from [facility] with AKI on CKD stage IIIa, hyponatremia, and acute on chronic anemia.   Patient was then seen by his PCP on 5/23 where they obtained blood work, that showed hemoglobin of 8.9 with Hemoccult positive.  Transferred to [place] [facility] and seen by [employer]  Coumadin was initially held but because of mech. heart valve, was restarted since no procedure planned.  Coumadin is managed and dosed by pharmacy. Patient has significant bruising on exam.  5/30: Patient continued to improve, renal function improving and now close to baseline. Per swallow team evaluation patient is moderate risk for aspiration and they were recommending barium swallow. Patient also has chronic hyponatremia which seems stable.  Patient was also found to have acute on chronic anemia, FOBT was positive and gastroenterology was consulted.  No plan for any acute intervention at this time. Patient has received IV iron. Is currently on Coumadin due to mechanical heart valve,  Patient also has an history of chronic combined systolic and diastolic heart failure with EF of 45 to 50% and grade 2 diastolic dysfunction according to echo done in October 2022. Clinically appears euvolemic.  Home dose of spironolactone and torsemide was being held due to AKI and softer blood pressure.  Patient was on midodrine at home which he is  continuing.     Assessment and Plan: * Acute renal failure superimposed on stage 3a chronic kidney disease (HCC) Creatinine 2.38 prior to arrival with baseline 1.5-1.6.  Appears volume depleted on admission.  Creatinine improved to 1.51 today. -Stop IV fluid -Keep holding spironolactone, metformin, Farxiga -Monitor UOP  Hyponatremia Sodium 126, history of chronic hyponatremia. Sodium appears to be at baseline.  Slight worsening after getting some IV fluid. -Stop IV fluid. -Continue to monitor  Acute on chronic anemia Hemoglobin slightly decreased to 8.9 at outside hospital compared to baseline 11-13.  Denies obvious bleeding but has history of chronically positive FOBT stools.  Follows with  GI, Dr. Lyndel Safe.  High risk for endoscopic evaluation due to his cardiopulmonary disease per recent documentation. Per GI no further investigation at this time -Hemoglobin seems stable -Continue to monitor  Coronary artery disease involving native coronary artery of native heart with angina pectoris [facility]) S/p CABG.  Denies any chest pain.  Continue statin.  Chronic combined systolic and diastolic CHF (congestive heart failure) ([place]) Appears hypovolemic on admission.  Last EF 45-50% with G2 DD on TTE 09/30/2021. -On gentle IV fluids as above -Strict I/O's and daily weights -Holding spironolactone and Farxiga with AKI  [name] Coumadin on hold for now as above.  Pulmonary arterial hypertension (HCC) Continue home selexipag, macitentan, tadalafil.  Hypotension, chronic BP currently stable.  Has been on midodrine in the past.  Chronic atrial fibrillation ([place]) Remains in atrial fibrillation with controlled rate.  Not on rate controlling agents due to history of bradycardia.  Holding Coumadin for now as above.  COPD (chronic obstructive pulmonary disease) (HCC) Stable without  wheezing or dyspnea on admission.  Continue albuterol as needed.     Subjective: Patient complaining of pill being stuck in his throat.  Denies any pain.  Palpation no baseline oxygen use.  Physical Exam: Vitals:   05/23/22 0900 05/23/22 1000 05/23/22 1100 05/23/22 1200  BP: (!) 89/53 (!) 104/53 105/60 (!) 101/57  Pulse: 68 64 (!) 105 71  Resp: 19 20 (!) 22 18  Temp:    97.8 F (36.6 C)  TempSrc:    Oral  SpO2: 96% 96% 92% 91%  Weight:      Height:       General.  Severely malnourished gentleman in no acute distress. Pulmonary.  Lungs clear bilaterally, normal respiratory effort. CV.  Regular rate and rhythm, no JVD, rub or murmur. Abdomen.  Soft, nontender, nondistended, BS positive. CNS.  Alert and oriented .  No focal neurologic deficit. Extremities.  No edema, no cyanosis, pulses intact and symmetrical.  Multiple bruising all over the body. Psychiatry.  Judgment and insight appears normal.  Data Reviewed: Prior notes, labs and images reviewed  Family Communication:   Disposition: Status is: Inpatient Remains inpatient appropriate because: Severity of illness   Planned Discharge Destination: Skilled nursing facility  DVT prophylaxis.  Coumadin Time spent: 50 minutes  This record has been created using Systems analyst. Errors have been sought and corrected,but may not always be located. Such creation errors do not reflect on the standard of care.  Author: Lorella Nimrod, MD 05/23/2022 2:58 PM  For on call review [email or web address].

## 2022-05-24 DIAGNOSIS — Z515 Encounter for palliative care: Secondary | ICD-10-CM

## 2022-05-24 DIAGNOSIS — Z7901 Long term (current) use of anticoagulants: Secondary | ICD-10-CM | POA: Diagnosis not present

## 2022-05-24 DIAGNOSIS — Z7189 Other specified counseling: Secondary | ICD-10-CM | POA: Diagnosis not present

## 2022-05-24 DIAGNOSIS — R627 Adult failure to thrive: Secondary | ICD-10-CM | POA: Diagnosis not present

## 2022-05-24 DIAGNOSIS — I482 Chronic atrial fibrillation, unspecified: Secondary | ICD-10-CM | POA: Diagnosis not present

## 2022-05-24 DIAGNOSIS — Z952 Presence of prosthetic heart valve: Secondary | ICD-10-CM | POA: Diagnosis not present

## 2022-05-24 DIAGNOSIS — N170 Acute kidney failure with tubular necrosis: Secondary | ICD-10-CM | POA: Diagnosis not present

## 2022-05-24 DIAGNOSIS — I5042 Chronic combined systolic (congestive) and diastolic (congestive) heart failure: Secondary | ICD-10-CM | POA: Diagnosis not present

## 2022-05-24 DIAGNOSIS — J449 Chronic obstructive pulmonary disease, unspecified: Secondary | ICD-10-CM | POA: Diagnosis not present

## 2022-05-24 LAB — CBC WITH DIFFERENTIAL/PLATELET
Abs Immature Granulocytes: 0 10*3/uL (ref 0.00–0.07)
Basophils Absolute: 0 10*3/uL (ref 0.0–0.1)
Basophils Relative: 1 %
Eosinophils Absolute: 0 10*3/uL (ref 0.0–0.5)
Eosinophils Relative: 1 %
HCT: 28.7 % — ABNORMAL LOW (ref 39.0–52.0)
Hemoglobin: 8.9 g/dL — ABNORMAL LOW (ref 13.0–17.0)
Immature Granulocytes: 0 %
Lymphocytes Relative: 14 %
Lymphs Abs: 0.4 10*3/uL — ABNORMAL LOW (ref 0.7–4.0)
MCH: 24.5 pg — ABNORMAL LOW (ref 26.0–34.0)
MCHC: 31 g/dL (ref 30.0–36.0)
MCV: 78.8 fL — ABNORMAL LOW (ref 80.0–100.0)
Monocytes Absolute: 0.6 10*3/uL (ref 0.1–1.0)
Monocytes Relative: 19 %
Neutro Abs: 2 10*3/uL (ref 1.7–7.7)
Neutrophils Relative %: 65 %
Platelets: 128 10*3/uL — ABNORMAL LOW (ref 150–400)
RBC: 3.64 MIL/uL — ABNORMAL LOW (ref 4.22–5.81)
RDW: 21.2 % — ABNORMAL HIGH (ref 11.5–15.5)
WBC: 3 10*3/uL — ABNORMAL LOW (ref 4.0–10.5)
nRBC: 0 % (ref 0.0–0.2)

## 2022-05-24 LAB — GLUCOSE, CAPILLARY
Glucose-Capillary: 79 mg/dL (ref 70–99)
Glucose-Capillary: 79 mg/dL (ref 70–99)
Glucose-Capillary: 135 mg/dL — ABNORMAL HIGH (ref 70–99)
Glucose-Capillary: 118 mg/dL — ABNORMAL HIGH (ref 70–99)

## 2022-05-24 LAB — MAGNESIUM: Magnesium: 2 mg/dL (ref 1.7–2.4)

## 2022-05-24 LAB — BASIC METABOLIC PANEL
Anion gap: 9 (ref 5–15)
BUN: 31 mg/dL — ABNORMAL HIGH (ref 8–23)
CO2: 24 mmol/L (ref 22–32)
Calcium: 9.9 mg/dL (ref 8.9–10.3)
Chloride: 94 mmol/L — ABNORMAL LOW (ref 98–111)
Creatinine, Ser: 1.29 mg/dL — ABNORMAL HIGH (ref 0.61–1.24)
GFR, Estimated: 58 mL/min — ABNORMAL LOW (ref >60)
Glucose, Bld: 79 mg/dL (ref 70–99)
Potassium: 4.6 mmol/L (ref 3.5–5.1)
Sodium: 127 mmol/L — ABNORMAL LOW (ref 135–145)

## 2022-05-24 LAB — PROTIME-INR
INR: 4 — ABNORMAL HIGH (ref 0.8–1.2)
Prothrombin Time: 38.5 seconds — ABNORMAL HIGH (ref 11.4–15.2)

## 2022-05-24 MED ORDER — NAPHAZOLINE-PHENIRAMINE 0.025-0.3 % OP SOLN
1.0000 [drp] | Freq: Four times a day (QID) | OPHTHALMIC | Status: DC | PRN
  Administered 2022-05-24 – 2022-05-29 (×4): 1 [drp] via OPHTHALMIC
  Filled 2022-05-24 (×2): qty 5

## 2022-05-24 NOTE — Progress Notes (Signed)
[facility] PROGRESS NOTE    Progress Note  Lance Hicks  ZFP:[id number] DOB: 03/23/1946 DOA: 05/17/2022 PCP: [name], PA-C     Brief Narrative:   Lance Hicks is an 76 y.o. male past medical history significant for chronic combined systolic and diastolic heart failure with an EF of 45%, CAD status post CABG, chronic atrial fibrillation and aortic stenosis status post mechanical aortic valve replacement on Coumadin, pulmonary hypertension, chronic kidney disease stage III yea, hepatic cirrhosis, anemia of chronic disease admitted to [facility] for acute kidney injury and hyponatremia, was seen by physician on May 2023 obtain blood work that showed a hemoglobin of 9 Hemoccult positive, transferred to [facility] seen by [facility] [place], Coumadin was initially held which was eventually restarted as no procedure was plan.  His renal function improved and is now close to baseline.  He is at moderate risk for aspiration and a barium swallow was recommended that showed severe pharyngeal dysphagia and poor pharyngeal mobility.  Patient will aspirate regardless of what he consumes.  Speech recommended n.p.o. except for ice chips.  His home dose of spironolactone and torsemide were held on admission due to acute kidney injury and soft blood pressure   Assessment/Plan:   Acute renal failure superimposed on stage 3a chronic kidney disease (HCC) Baseline creatinine 1.5-1.6 appears to be volume depleted on admission diuretics were held on admission as his creatinine was 2.3. Eventually his creatinine improved to 1.5 fluids were stopped Aldactone torsemide metformin and [name] continue to be held. He is about one-point 5-1/2 L positive  Chronic hyponatremia Appears to be at baseline slightly improved.  Acute on chronic anemia Baseline  Like around 11 denies any bleeding, hemoglobin of admission 8.9 FOBT positive GI was consulted who deemed him high risk for endoscopic  procedure. Due to cardiopulmonary disease they recommended no further intervention. He was given IV iron. Hemoglobin has remained relatively stable.  Coronary artery disease involving native coronary artery of native heart with angina pectoris ([place]) Denies any chest pain continue statins and Coumadin.  Chronic combined systolic and diastolic CHF (congestive heart failure) ([place]) With an EF of 45%. Started on gentle fluids as above. Now positive by 1.5 L.  History of mechanical aortic valve: Coumadin was initially held now has been resumed, this morning is 4.0 we will have to hold Coumadin for today.  Pulmonary arterial hypertension: Continue home selexipag, macitentan, tadalafil.  Chronic hypotension: Continue midodrine.  Chronic atrial fibrillation: Rate control now on Coumadin INR is elevated see above.  Severe protein caloric malnutrition Estimated body mass index is 19.11 kg/m as calculated from the following:   Height as of this encounter: _0  (1.753 m).   Weight as of this encounter: 58.7 kg. Malnutrition Type:  Nutrition Problem: Severe Malnutrition Etiology: chronic illness   Malnutrition Characteristics:  Signs/Symptoms: moderate muscle depletion, severe fat depletion, severe muscle depletion   Nutrition Interventions:  Interventions: Ensure Enlive (each supplement provides 350kcal and 20 grams of protein), MVI    DVT prophylaxis: Coumadin Family Communication:none Status is: Inpatient Remains inpatient appropriate because: Aspiration, acute kidney injury and worsening hyponatremia    Code Status:     Code Status Orders  (From admission, onward)           Start     Ordered   05/17/22 1932  Full code  Continuous        05/17/22 1933           Code Status History  Date Active Date Inactive Code Status Order ID Comments User Context   01/25/2022 1641 02/05/2022 1922 DNR [id number]  Lin Landsman, NP Inpatient   01/23/2022 1818  01/25/2022 1641 Full Code [id number]  Lequita Halt, MD ED   11/03/2021 1718 11/09/2021 1727 Full Code [id number]  Consuelo Pandy, PA-C Inpatient   09/29/2021 1430 10/04/2021 2049 Full Code [id number]  Jolaine Artist, MD Inpatient   05/14/2018 1145 05/23/2018 1709 Full Code [id number]  Terell, Kincy, PA-C Inpatient   01/18/2018 1104 01/18/2018 1529 Full Code [id number]  Bensimhon, Shaune Pascal, MD Inpatient      Advance Directive Documentation    Flowsheet Row Most Recent Value  Type of Advance Directive Healthcare Power of Attorney  Pre-existing out of facility DNR order (yellow form or pink MOST form) --  "MOST" Form in Place? --         IV Access:   Peripheral IV   Procedures and diagnostic studies:   DG Swallowing Func-Speech Pathology  Result Date: 05/23/2022 Table formatting from the original result was not included. Images from the original result were not included. Objective Swallowing Evaluation: Type of Study: MBS-Modified Barium Swallow Study  Patient Details Name: Lance Hicks MRN: [id number] Date of Birth: 1946-10-29 Today's Date: 05/23/2022 Time: SLP Start Time (ACUTE ONLY): 1430 -SLP Stop Time (ACUTE ONLY): 1455 SLP Time Calculation (min) (ACUTE ONLY): 25 min Past Medical History: Past Medical History: Diagnosis Date  Allergic rhinitis 04/28/2016  Last Assessment & Plan:  Continue astelin  Anemia 07/01/2019  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  Last Assessment & Plan:  Stable in size on annual imaging. In light of concurrent left nephrolithiasis, will check CT renal colic next year instead of renal US.   Anticoagulated on Coumadin 01/04/2018  Anxiety 05/10/2016  Last Assessment & Plan:  Doing well off of zoloft.  Asthma   Asymptomatic microscopic hematuria 06/20/2017  Last Assessment & Plan:  Had hematuria workup in [facility] in 2016 which negative CT and cystoscopy. UA with 2+ blood last visit - we discussed recommendation for repeat workup at 5 years or if degree of  hematuria progresses.   Atrial fibrillation ([place]) 03/29/2017  Atrial flutter ([place]) 03/29/2017  Backache 12/14/2015  Last Assessment & Plan:  Pain management referral for further evaluation.  Bilateral pleural effusion 08/09/2017  CHF (congestive heart failure) (HCC)   Chronic allergic rhinitis 04/28/2016  Last Assessment & Plan:  Continue astelin  Chronic anticoagulation 03/29/2017  Chronic atrial fibrillation ([place]) 07/23/2015  Last Assessment & Plan:  Coumadin and metoprolol, cardiology referral to establish care.  Chronic midline back pain 12/14/2015  Last Assessment & Plan:  Pain management referral for further evaluation.  Chronic obstructive lung disease ([place]) 06/12/2016  With hypoxia  Chronic prostatitis 07/23/2015  Last Assessment & Plan:  Has largely resolved since stopping bike riding. Recommend annual DRE AND PSA - will see back 12/2015 for annual screening, given 1st degree fhx. To call office for recurrent prostatitis symptoms.   Chronic respiratory failure with hypoxia ([place]) 07/29/2019  Cirrhosis of liver not due to alcohol ([place]) 07/01/2019  COPD (chronic obstructive pulmonary disease) ([place]) 06/12/2016  Coronary arteriosclerosis in native artery 03/29/2017  Coronary artery disease involving native coronary artery of native heart with angina pectoris ([place]) 03/29/2017  Cough 10/17/2016  Last Assessment & Plan:  Discussed typical course for acute viral illness. If symptoms worsen or fail to improve by 7-10d, delayed ATBs, fluids, rest, NSAIDs/APAP prn. Seek care if not improving. Needs earlier INR  check due to ATBs.  Dyspnea 02/01/2016  Last Assessment & Plan:  Overall improving, eval by pulm, plan for CT, neg stress test with cardiology. Recent switch to carvedilol due to side effects.  Encounter for therapeutic drug monitoring 01/06/2019  Enterococcal bacteremia   Epidermoid cyst of skin 08/24/2017  Essential hypertension 12/14/2015  Last Assessment & Plan:  Hypertension control: controlled  Medications: compliant Medication  Management: as noted in orders Home blood pressure monitoring recommended additionally as needed for symptoms  The patient's care plan was reviewed and updated. Instructions and counseling were provided regarding patient goals and barriers. He was counseled to adopt a healthy lifestyle. Educational resources and self-management tools have been provided as charted in [facility] list.   H/O maze procedure 03/29/2017  [name] 03/29/2017  Overview:  2011  History of coronary artery bypass graft 03/29/2017  Hx of CABG 03/29/2017  Hyperlipidemia 03/29/2017  Hypersensitivity angiitis ([place]) 10/01/2017  Hypertensive heart disease 03/29/2017  Hypertensive heart disease with heart failure ([place]) 03/29/2017  Hypertensive heart failure ([place]) 03/29/2017  Hypokalemia due to excessive renal loss of potassium 02/18/2018  Hypotension, chronic 06/06/2018  International normalized ratio (INR) raised 07/26/2017  Kidney stone 07/23/2015  Kidney stones 07/23/2015  Overview:  x 3  Last Assessment & Plan:  By [place] has left nephrolithiasis, but not visible by KUB. Will check CT renal colic next year to assess both stone burden as well as to surveil AML.   Left ureteral stone 01/23/2018  Leukocytoclastic vasculitis ([place]) 10/01/2017  Localized edema 01/04/2018  Long term (current) use of anticoagulants 03/29/2017  Lumbar radicular pain 01/19/2016  Lumbar radiculopathy 01/19/2016  Maculopapular rash 09/03/2017  Microscopic hematuria 06/20/2017  Last Assessment & Plan:  Had hematuria workup in [facility] in 2016 which negative CT and cystoscopy. UA with 2+ blood last visit - we discussed recommendation for repeat workup at 5 years or if degree of hematuria progresses.   Multiple nodules of lung 06/12/2016  Nasal discharge 02/25/2016  Last Assessment & Plan:  Trial zyrtec and flonase  Nephrolithiasis 07/23/2015  Overview:  x 3  Last Assessment & Plan:  By [place] has left nephrolithiasis, but not visible by KUB. Will check CT renal colic next year to assess both stone  burden as well as to surveil AML.   Overview:  x 3  Last Assessment & Plan:  Has 30m nonobstructing LUP stone - not visible by KUB.  Will check renal [place]2019 - he will contact office sooner if symptomatic.   Non-sustained ventricular tachycardia ([place] 03/29/2017  Nonsustained ventricular tachycardia ([place] 03/29/2017  Other hyperlipidemia 03/29/2017  Palpitations 10/01/2017  Pleural effusion, bilateral 08/09/2017  Pneumonia due to COVID-19 virus 02/07/2021  Post-nasal drainage 02/25/2016  Last Assessment & Plan:  Trial zyrtec and flonase  Prostate cancer screening 06/20/2017  Last Assessment & Plan:  Recommend continued annual CaP screening until within 10 years of life expectancy. Given good health and fhx of longevity, would anticipate CaP screening to continue until age 75  PSA today and again in one year on day of visit.  Pulmonary arterial hypertension ([place] 02/20/2018  Pulmonary edema   Pulmonary hypertension ([place] 08/09/2017  Pulmonary nodules 06/12/2016  S/P AVR 03/20/2016  S/P AVR (aortic valve replacement) 03/20/2016  Strain of deltoid muscle, initial encounter   Supratherapeutic INR 07/26/2017  Syncope and collapse 02/01/2016  Typical atrial flutter ([place] 02/01/2016 Past Surgical History: Past Surgical History: Procedure Laterality Date  CHOLECYSTECTOMY    CORONARY ARTERY BYPASS GRAFT  EXPLORATORY LAPAROTOMY  07/30/2017  FOOT SURGERY    FRACTURE SURGERY Right   wrist and forearm  HERNIA REPAIR    MECHANICAL AORTIC VALVE REPLACEMENT    NASAL SINUS SURGERY    RIGHT HEART CATH N/A 01/18/2018  Procedure: RIGHT HEART CATH;  Surgeon: Jolaine Artist, MD;  Location: [place] [facility] LAB;  Service: Cardiovascular;  Laterality: N/A;  RIGHT HEART CATH N/A 05/14/2018  Procedure: RIGHT HEART CATH;  Surgeon: Jolaine Artist, MD;  Location: [place] [facility];  Service: Cardiovascular;  Laterality: N/A;  RIGHT HEART CATH N/A 09/29/2021  Procedure: RIGHT HEART CATH;  Surgeon: Jolaine Artist, MD;  Location: [place] [facility];   Service: Cardiovascular;  Laterality: N/A;  TEE WITHOUT CARDIOVERSION N/A 05/21/2018  Procedure: TRANSESOPHAGEAL ECHOCARDIOGRAM (TEE);  Surgeon: Jolaine Artist, MD;  Location: [facility] ENDOSCOPY;  Service: Cardiovascular;  Laterality: N/A;  UPPER GASTROINTESTINAL ENDOSCOPY  07/12/2017  Patchy areas of mucosal inflammation noted in the antrum with edema,erthema and ulcerations. Bx. Chronicfocally active gastritis.  VASECTOMY   HPI: Pt is a 76 yo male transferred from [place] with AKI and CKD3, hyponatremia, acute on chronic anemia.  Has history of dysphagia,  recent nausea/vomiting, GERD, anemia, cirrhosis, CHF, COPD,  pulmonary hypertension, coronary artery disease with prior CABG and  mechanical AVR, chronic respiratory failure, aortic stenosis and  atrial fibrillation. History of EGD in 2018 with gastritis. Hgb stable, pt on coumadin.  Hospital coarse compicated by delirium.  RN noted pt coughing with intake of liquids and SLP swallow eval thus ordered.  Per notes in chart, pt also has FTT.  UGI study 5/10 showed aspiration episode x1 per radiologist note -- SLP reviewed flouro loop and pt also had pharyngeal retention.  Pt reports h/o sensing retention in throat and esophagus and chronic cough since he had COVID this year.   He does endorse improvement with being on reflux medications.  Has had pna twice since COVID per pt.  Eats 2 meals a day.  Denies need for heimlich manuever.  Subjective: pt awake in chair  Recommendations for follow up therapy are one component of a multi-disciplinary discharge planning process, led by the attending physician.  Recommendations may be updated based on patient status, additional functional criteria and insurance authorization. Assessment / Plan / Recommendation   05/23/2022   3:56 PM Clinical Impressions Clinical Impression Pt given limited barium including thin, nectar and single bolus of puree.  Moderate oral dysphagia characterized by weakness - causing pt to expend  significant effort to elicit oral transiting with delay. He tends to dip head forward to help elicit a swallow and admits it requires effort.   Severe pharyngeal dysphagia evidenced by poor pharyngeal motility resulting in retention throughout pharynx with all consistencies that mixed with secretions.  Retention did not clear despite liquid swallow and cued dry swallows *cued dry swallows inconsistently conducted and appeared to fatigue pt.  Cued "hock" was not produced despite demonstration from SLP x2 and thus pt did not clear vallecular retention.  Aspiration of nectar observed due to poor motility and did not clear despite reflexive cough.  HOB reclined and head turn right/left did not prevent or facilitate pharyngeal clearance. Following MBS, pt admitted to chronic problems swallowing.   Pt will aspirate regardless of consistency he consumes and retention is worse with increased visocity.     Discussed with pt severity of his dysphagia with aspiration (Of even secretions).  He advised that he NOT want to be a full code.  Recommend consider palliative consult to establish [place] given pt's deconditioning and level of dysphagia.  Recommend NPO x ice chips and change medications to IV (that can be changed).  PO Intake for comfort with accepted risks may align with pt's goals.  Prognosis for swallow function to return is guarded given chronicity of and severity of dysphagia. SLP Visit Diagnosis Dysphagia, oropharyngeal phase (R13.12) Impact on safety and function Risk for inadequate nutrition/hydration;Severe aspiration risk     05/23/2022   3:56 PM Treatment Recommendations Treatment Recommendations Therapy as outlined in treatment plan below     05/23/2022   3:58 PM Prognosis Prognosis for Safe Diet Advancement Guarded Barriers to Reach Goals Time post onset;Severity of deficits   05/23/2022   3:56 PM Diet Recommendations SLP Diet Recommendations Ice chips PRN after oral care Medication Administration Via  alternative means     05/23/2022   3:56 PM Other Recommendations Oral Care Recommendations Oral care BID Other Recommendations Have oral suction available Follow Up Recommendations Other (comment) Functional Status Assessment Patient has had a recent decline in their functional status and/or demonstrates limited ability to make significant improvements in function in a reasonable and predictable amount of time    View : No data to display.        05/23/2022   3:49 PM Oral Phase Oral Phase Impaired Oral - Nectar Teaspoon Decreased bolus cohesion Oral - Nectar Cup Decreased bolus cohesion;Weak lingual manipulation;Premature spillage;Reduced posterior propulsion Oral - Nectar Straw Decreased bolus cohesion;Weak lingual manipulation;Premature spillage;Reduced posterior propulsion Oral - Thin Teaspoon Decreased bolus cohesion;Weak lingual manipulation;Premature spillage;Reduced posterior propulsion Oral - Thin Cup Decreased bolus cohesion;Weak lingual manipulation;Premature spillage;Reduced posterior propulsion Oral - Thin Straw Decreased bolus cohesion;Weak lingual manipulation;Premature spillage;Reduced posterior propulsion Oral - Puree Premature spillage;Reduced posterior propulsion    05/23/2022   3:50 PM Pharyngeal Phase Pharyngeal Phase Impaired Pharyngeal- Nectar Teaspoon Reduced pharyngeal peristalsis;Reduced epiglottic inversion;Pharyngeal residue - valleculae;Pharyngeal residue - pyriform;Reduced airway/laryngeal closure;Reduced tongue base retraction;Delayed swallow initiation-vallecula Pharyngeal Material does not enter airway Pharyngeal- Nectar Cup Delayed swallow initiation-vallecula;Reduced epiglottic inversion;Reduced pharyngeal peristalsis;Reduced tongue base retraction;Reduced airway/laryngeal closure;Pharyngeal residue - pyriform;Pharyngeal residue - valleculae;Moderate aspiration;Penetration/Aspiration during swallow Pharyngeal Material enters airway, passes BELOW cords and not ejected out despite cough  attempt by patient Pharyngeal- Nectar Straw Delayed swallow initiation-vallecula;Reduced epiglottic inversion;Reduced anterior laryngeal mobility;Reduced laryngeal elevation;Reduced airway/laryngeal closure;Reduced tongue base retraction;Pharyngeal residue - valleculae;Pharyngeal residue - pyriform Pharyngeal Material does not enter airway Pharyngeal- Thin Teaspoon Pharyngeal residue - pyriform;Reduced pharyngeal peristalsis;Reduced epiglottic inversion;Reduced anterior laryngeal mobility;Reduced laryngeal elevation;Reduced airway/laryngeal closure;Reduced tongue base retraction;Delayed swallow initiation-vallecula;Lateral channel residue Pharyngeal Material does not enter airway Pharyngeal- Thin Cup Delayed swallow initiation-vallecula;Reduced pharyngeal peristalsis;Reduced epiglottic inversion;Reduced anterior laryngeal mobility;Reduced laryngeal elevation;Reduced airway/laryngeal closure;Reduced tongue base retraction;Pharyngeal residue - valleculae;Pharyngeal residue - pyriform;Lateral channel residue Pharyngeal Material does not enter airway Pharyngeal- Thin Straw Delayed swallow initiation-vallecula;Reduced pharyngeal peristalsis;Reduced epiglottic inversion;Reduced anterior laryngeal mobility;Reduced laryngeal elevation;Reduced tongue base retraction;Lateral channel residue Pharyngeal Material does not enter airway Pharyngeal- Puree Reduced epiglottic inversion;Reduced pharyngeal peristalsis;Reduced tongue base retraction;Pharyngeal residue - valleculae;Pharyngeal residue - pyriform Pharyngeal Material does not enter airway    05/23/2022   3:56 PM Cervical Esophageal Phase  Cervical Esophageal Phase Impaired Kathleen Lime, MS [facility] SLP Acute Rehab Services Office [phone] Pager [phone] Macario Golds 05/23/2022, 4:00 PM                       Medical Consultants:   None.   Subjective:    Lance Hicks has no  complaints this morning.  Objective:    Vitals:   05/24/22 0300 05/24/22 0400  05/24/22 0500 05/24/22 0600  BP: 103/60     Pulse: 76 70 70 64  Resp: _0 Temp:  (!) 97.3 F (36.3 C)    TempSrc:  Oral    SpO2: 95% 97% 93% 96%  Weight:      Height:       SpO2: 96 % O2 Flow Rate (L/min): 2 L/min   Intake/Output Summary (Last 24 hours) at 05/24/2022 0704 Last data filed at 05/24/2022 0354 Gross per 24 hour  Intake 500 ml  Output 650 ml  Net -150 ml   Filed Weights   05/21/22 0500 05/22/22 0436 05/23/22 0500  Weight: 44.4 kg 44.4 kg 58.7 kg    Exam: General exam: In no acute distress, cachectic Respiratory system: Good air movement and clear to auscultation. Cardiovascular system: S1 & S2 heard, RRR. No JVD. Gastrointestinal system: Abdomen is nondistended, soft and nontender.  Extremities: No pedal edema. Skin: No rashes, lesions or ulcers Psychiatry: Judgement and insight appear normal. Mood & affect appropriate.    Data Reviewed:    Labs: Basic Metabolic Panel: Recent Labs  Lab 05/20/22 0248 05/21/22 0309 05/22/22 0300 05/23/22 0233 05/24/22 0256  NA 123* 125* 126* 125* 127*  K 4.3 4.3 4.5 4.7 4.6  CL 90* 93* 94* 93* 94*  CO2 _1 GLUCOSE 96 94 81 71 79  BUN 42* 39* 39* 36* 31*  CREATININE 1.67* 1.61* 1.72* 1.51* 1.29*  CALCIUM 9.2 9.5 9.6 9.4 9.9  MG 2.2 2.3 2.1 2.0 2.0  PHOS  --   --   --  2.6  --    GFR Estimated Creatinine Clearance: 41.1 mL/min (A) (by C-G formula based on SCr of 1.29 mg/dL (H)). Liver Function Tests: Recent Labs  Lab 05/17/22 1959 05/18/22 0614 05/21/22 0309  AST 33 29 27  ALT _2 ALKPHOS 74 67 71  BILITOT 3.6* 3.3* 3.2*  PROT 7.8 7.2 7.1  ALBUMIN 3.5 3.2* 3.2*   No results for input(s): LIPASE, AMYLASE in the last 168 hours. No results for input(s): AMMONIA in the last 168 hours. Coagulation profile Recent Labs  Lab 05/20/22 0905 05/21/22 0309 05/22/22 0300 05/23/22 0233 05/24/22 0256  INR 2.0* 1.9* 2.3* 3.2* 4.0*   COVID-19 Labs  No results for input(s):  DDIMER, FERRITIN, LDH, CRP in the last 72 hours.  Lab Results  Component Value Date   SARSCOV2NAA NEGATIVE 02/04/2022   [place] NEGATIVE 01/23/2022   [place] NEGATIVE 09/29/2021   [place] Not Detected 06/17/2021    CBC: Recent Labs  Lab 05/20/22 0248 05/21/22 0309 05/22/22 0300 05/23/22 0233 05/24/22 0256  WBC 3.8* 3.5* 3.7* 3.5* 3.0*  NEUTROABS 3.1 2.7 2.9 2.5 2.0  HGB 8.7* 8.4* 8.6* 8.6* 8.9*  HCT 28.8* 28.0* 27.1* 27.6* 28.7*  MCV 78.3* 79.1* 77.7* 77.5* 78.8*  PLT 111* 108* 117* 110* 128*   Cardiac Enzymes: No results for input(s): CKTOTAL, CKMB, CKMBINDEX, TROPONINI in the last 168 hours. BNP (last 3 results) Recent Labs    07/04/21 1607 08/16/21 1620 02/23/22 1218  PROBNP 425.0* 601.0* 891.0*   CBG: Recent Labs  Lab 05/23/22 1228 05/23/22 1523 05/23/22 1938 05/23/22 2329 05/24/22 0356  GLUCAP 126* 127* 113* 93 79   D-Dimer: No results for input(s): DDIMER in the last 72 hours. Hgb A1c: No results for input(s): HGBA1C in the last 72 hours. Lipid Profile:  No results for input(s): CHOL, HDL, LDLCALC, TRIG, CHOLHDL, LDLDIRECT in the last 72 hours. Thyroid function studies: No results for input(s): TSH, T4TOTAL, T3FREE, THYROIDAB in the last 72 hours.  Invalid input(s): FREET3 Anemia work up: No results for input(s): VITAMINB12, FOLATE, FERRITIN, TIBC, IRON, RETICCTPCT in the last 72 hours. Sepsis Labs: Recent Labs  Lab 05/21/22 0309 05/22/22 0300 05/23/22 0233 05/24/22 0256  WBC 3.5* 3.7* 3.5* 3.0*   Microbiology Recent Results (from the past 240 hour(s))  MRSA Next Gen by PCR, Nasal     Status: Abnormal   Collection Time: 05/19/22  1:38 AM   Specimen: Nasal Mucosa; Nasal Swab  Result Value Ref Range Status   MRSA by PCR Next Gen DETECTED (A) NOT DETECTED Final    Comment: (NOTE) The GeneXpert MRSA Assay (FDA approved for NASAL specimens only), is one component of a comprehensive MRSA colonization surveillance program. It is  not intended to diagnose MRSA infection nor to guide or monitor treatment for MRSA infections. Test performance is not FDA approved in patients less than 25 years old. Performed at [facility], [place] [address] [place], [place] [address]      Medications:    (feeding supplement) PROSource Plus  30 mL Oral Daily   busPIRone  7.5 mg Oral BID   Chlorhexidine Gluconate Cloth  6 each Topical Daily   docusate sodium  100 mg Oral BID   famotidine  40 mg Oral QHS   feeding supplement  237 mL Oral BID BM   macitentan  10 mg Oral Daily   mouth rinse  15 mL Mouth Rinse BID   midodrine  5 mg Oral TID WC   multivitamin with minerals  1 tablet Oral Daily   mupirocin ointment  1 application. Nasal BID   pantoprazole (PROTONIX) IV  40 mg Intravenous Q12H   polyethylene glycol  17 g Oral BID   rosuvastatin  5 mg Oral Daily   Selexipag  200 mcg Oral QHS   And   Selexipag  400 mcg Oral Daily   sodium chloride flush  3 mL Intravenous Q12H   sodium chloride  1 g Oral BID WC   sucralfate  1 g Oral BID   tadalafil  20 mg Oral Daily   Warfarin - Pharmacist Dosing Inpatient   Does not apply q1600   Continuous Infusions:    LOS: 7 days   Charlynne Cousins  [facility]  05/24/2022, 7:04 AM

## 2022-05-24 NOTE — Consult Note (Signed)
Consultation Note Date: 05/24/2022   Patient Name: Lance Hicks  DOB: Dec 15, 1946  MRN: [id number]  Age / Sex: 76 y.o., male  PCP: Lance Hicks Referring Physician: Aileen Hicks, Lance Klippel, MD  Reason for Consultation: Establishing goals of care  HPI/Patient Profile: 76 y.o. male  with past medical history of chronic combined systolic and diastolic heart failure with a EF of 45%, CAD status post CABG, chronic A-fib, aortic stenosis status post mechanical valve replacement, pulmonary hypertension, chronic kidney disease, cirrhosis, anemia admitted on 05/17/2022 with renal failure, hyponatremia and combined heart failure.  Further work-up has revealed concern for aspiration.  Palliative consulted for goals of care.  Clinical Assessment and Goals of Care: I met today with Lance Hicks.   He reports that doctors have been doing a good job explaining things to him but he states he did not care for approach of one doctor who "more or less said I am going to die soon."  He feels he is improving and very much minimizes any concerns that are raised regarding his multiple comorbidities and the fact that he has many "non-fixable" comorbidities.  We also discussed aspiration and concern that this is going to be continued concern moving forward.  Discussed that this was not likely to be a problem that was going to go away and briefly touched on consideration for comfort feeds.  Discussed his hopes for the future and he reports wanting to get back home to be able to go out to the field by his house to watch horses.  We discussed clinical course as well as wishes moving forward in regard to advanced directives.  Concepts specific to code status and care plan this care plan this hospitalization discussed.  Values and goals of care important to patient and family were attempted to be elicited.  We also discussed surrogate  decision making he reports that his neighbor, Lance Hicks, is his HCPOA.  There is a copy of this on the chart.   Questions and concerns addressed.   PMT will continue to support holistically.  SUMMARY OF RECOMMENDATIONS   -Full code/full scope -[name] minimizes concerns about his situation and it was difficult to engage him in conversation regarding goals of care as he does not seem to fully accept/believe severity of his condition.  We will continue to engage with him and also see if we can involve his healthcare power of attorney/neighbor as we attempt to elicit his long-term goals moving forward.  Code Status/Advance Care Planning: Full code Prognosis:  Guarded  Discharge Planning: To Be Determined      Primary Diagnoses: Present on Admission:  Acute renal failure superimposed on stage 3a chronic kidney disease (HCC)  Chronic atrial fibrillation (HCC)  COPD (chronic obstructive pulmonary disease) (HCC)  Coronary artery disease involving native coronary artery of native heart with angina pectoris (HCC)  Hyponatremia  Hypotension, chronic  Acute on chronic anemia  Pulmonary arterial hypertension (HCC)  Chronic combined systolic and diastolic CHF (congestive heart failure) (HCC)  Heme positive stool  I have reviewed the medical record, interviewed the patient and family, and examined the patient. The following aspects are pertinent.  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 In light of concurrent left nephrolithiasis, will check CT renal colic next year instead of renal US.    Anticoagulated on Coumadin 01/04/2018   Anxiety 05/10/2016   Last Assessment & Plan:  Doing well off of zoloft.   Asthma    Asymptomatic microscopic hematuria 06/20/2017   Last Assessment & Plan:  Had hematuria  workup in [facility] in [facility] which negative CT and cystoscopy. UA with 2+ blood last visit - we discussed recommendation for repeat workup at 5 years or if degree of hematuria progresses.    Atrial fibrillation ([place]) 03/29/2017   Atrial flutter ([place]) 03/29/2017   Backache 12/14/2015   Last Assessment & Plan:  Pain management referral for further evaluation.   Bilateral pleural effusion 08/09/2017   CHF (congestive heart failure) (HCC)    Chronic allergic rhinitis 04/28/2016   Last Assessment & Plan:  Continue astelin   Chronic anticoagulation 03/29/2017   Chronic atrial fibrillation ([place]) 07/23/2015   Last Assessment & Plan:  Coumadin and metoprolol, cardiology referral to establish care.   Chronic midline back pain 12/14/2015   Last Assessment & Plan:  Pain management referral for further evaluation.   Chronic obstructive lung disease ([place]) 06/12/2016   With hypoxia   Chronic prostatitis 07/23/2015   Last Assessment & Plan:  Has largely resolved since stopping bike riding. Recommend annual DRE AND PSA - will see back 12/2015 for annual screening, given 1st degree fhx. To call office for recurrent prostatitis symptoms.    Chronic respiratory failure with hypoxia ([place]) 07/29/2019   Cirrhosis of liver not due to alcohol ([place]) 07/01/2019   COPD (chronic obstructive pulmonary disease) ([place]) 06/12/2016   Coronary arteriosclerosis in native artery 03/29/2017   Coronary artery disease involving native coronary artery of native heart with angina pectoris ([place]) 03/29/2017   Cough 10/17/2016   Last Assessment & Plan:  Discussed typical course for acute viral illness. If symptoms worsen or fail to improve by 7-10d, delayed ATBs, fluids, rest, NSAIDs/APAP prn. Seek care if not improving. Needs earlier INR check due to ATBs.   Dyspnea 02/01/2016   Last Assessment & Plan:  Overall improving, eval by pulm, plan for CT, neg stress test with cardiology. Recent switch to carvedilol due to side effects.   Encounter for therapeutic drug  monitoring 01/06/2019   Enterococcal bacteremia    Epidermoid cyst of skin 08/24/2017   Essential hypertension 12/14/2015   Last Assessment & Plan:  Hypertension control: controlled  Medications: compliant Medication Management: as noted in orders Home blood pressure monitoring recommended additionally as needed for symptoms  The patient's care plan was reviewed and updated. Instructions and counseling were provided regarding patient goals and barriers. He was counseled to adopt a healthy lifestyle. Educational resources and self-management tools have been provided as charted in [facility] list.    H/O maze procedure 03/29/2017   [name] 03/29/2017   Overview:  2011   History of coronary artery bypass graft 03/29/2017   Hx of CABG 03/29/2017   Hyperlipidemia 03/29/2017   Hypersensitivity angiitis ([place]) 10/01/2017   Hypertensive heart disease 03/29/2017   Hypertensive heart  disease with heart failure ([place]) 03/29/2017   Hypertensive heart failure ([place]) 03/29/2017   Hypokalemia due to excessive renal loss of potassium 02/18/2018   Hypotension, chronic 06/06/2018   International normalized ratio (INR) raised 07/26/2017   Kidney stone 07/23/2015   Kidney stones 07/23/2015   Overview:  x 3  Last Assessment & Plan:  By [place] has left nephrolithiasis, but not visible by KUB. Will check CT renal colic next year to assess both stone burden as well as to surveil AML.    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 UA with 2+ blood last visit - we discussed recommendation for repeat workup at 5 years or if degree of hematuria progresses.    Multiple nodules of lung  06/12/2016   Nasal discharge 02/25/2016   Last Assessment & Plan:  Trial zyrtec and flonase   Nephrolithiasis 07/23/2015   Overview:  x 3  Last Assessment & Plan:  By [place] has left nephrolithiasis, but not visible by KUB. Will check CT renal colic next year to assess both stone burden as well as to surveil AML.   Overview:  x 3  Last Assessment & Plan:  Has 4m nonobstructing LUP stone - not visible by KUB.  Will check renal [place]2019 - he will contact office sooner if symptomatic.    Non-sustained ventricular tachycardia ([place] 03/29/2017   Nonsustained ventricular tachycardia ([place] 03/29/2017   Other hyperlipidemia 03/29/2017   Palpitations 10/01/2017   Pleural effusion, bilateral 08/09/2017   Pneumonia due to COVID-19 virus 02/07/2021   Post-nasal drainage 02/25/2016   Last Assessment & Plan:  Trial zyrtec and flonase   Prostate cancer screening 06/20/2017   Last Assessment & Plan:  Recommend continued annual CaP screening until within 10 years of life expectancy. Given good health and fhx of longevity, would anticipate CaP screening to continue until age 76  PSA today and again in one year on day of visit.   Pulmonary arterial hypertension ([place] 02/20/2018   Pulmonary edema    Pulmonary hypertension ([place] 08/09/2017   Pulmonary nodules 06/12/2016   S/P AVR 03/20/2016   S/P AVR (aortic valve replacement) 03/20/2016   Strain of deltoid muscle, initial encounter    Supratherapeutic INR 07/26/2017   Syncope and collapse 02/01/2016   Typical atrial flutter ([place] 02/01/2016   Social History   Socioeconomic History   Marital status: Single    Spouse name: Not on file   Number of children: Not on file   Years of education: Not on file   Highest education level: Not on file  Occupational History   Not on file  Tobacco Use   Smoking status: Former    Packs/day: 2.00    Years: 34.00    Pack years: 68.00    Types: Cigarettes    Quit date: 07/16/2000    Years since quitting: 21.8   Smokeless tobacco: Never   Vaping Use   Vaping Use: Never used  Substance and Sexual Activity   Alcohol use: Not Currently   Drug use: No   Sexual activity: Not on file  Other Topics Concern   Not on file  Social History Narrative   Not  on file   Social Determinants of Health   Financial Resource Strain: Low Risk    Difficulty of Paying Living Expenses: Not very hard  Food Insecurity: No Food Insecurity   Worried About Charity fundraiser in the Last Year: Never true   Ran Out of Food in the Last Year: Never true  Transportation Needs: No Transportation Needs   Lack of Transportation (Medical): No   Lack of Transportation (Non-Medical): No  Physical Activity: Not on file  Stress: Not on file  Social Connections: Not on file   Family History  Problem Relation Age of Onset   Asthma Mother    Arthritis Mother    Heart attack Father    Hypertension Father    Stroke Paternal Grandmother    Colon cancer Neg Hx    Scheduled Meds:  (feeding supplement) PROSource Plus  30 mL Oral Daily   busPIRone  7.5 mg Oral BID   docusate sodium  100 mg Oral BID   famotidine  40 mg Oral QHS   feeding supplement  237 mL Oral BID BM   macitentan  10 mg Oral Daily   mouth rinse  15 mL Mouth Rinse BID   midodrine  5 mg Oral TID WC   multivitamin with minerals  1 tablet Oral Daily   mupirocin ointment  1 application. Nasal BID   pantoprazole (PROTONIX) IV  40 mg Intravenous Q12H   polyethylene glycol  17 g Oral BID   rosuvastatin  5 mg Oral Daily   Selexipag  200 mcg Oral QHS   And   Selexipag  400 mcg Oral Daily   sodium chloride flush  3 mL Intravenous Q12H   sodium chloride  1 g Oral BID WC   sucralfate  1 g Oral BID   tadalafil  20 mg Oral Daily   Warfarin - Pharmacist Dosing Inpatient   Does not apply q1600   Continuous Infusions: PRN Meds:.acetaminophen **OR** acetaminophen, hydrALAZINE, ipratropium-albuterol, metoprolol tartrate, naphazoline-pheniramine, ondansetron **OR** ondansetron (ZOFRAN) IV,  senna-docusate, sodium chloride, traZODone Medications Prior to Admission:  Prior to Admission medications   Medication Sig Start Date End Date Taking? Authorizing Provider  acetaminophen (TYLENOL) 500 MG tablet Take 1,000 mg by mouth every 6 (six) hours as needed for moderate pain. 02/12/22  Yes [provider]  albuterol (VENTOLIN HFA) 108 (90 Base) MCG/ACT inhaler INHALE 2 PUFFS INTO THE LUNGS EVERY 6 HOURS AS NEEDED FOR WHEEZING OR SHORTNESS OF BREATH 04/21/22  Yes Byrum, Rose Fillers, MD  ascorbic acid (VITAMIN C) 500 MG tablet Take 500 mg by mouth daily. 02/12/22  Yes [provider]  aspirin EC 81 MG tablet Take 81 mg by mouth daily. Swallow whole.   Yes [provider]  busPIRone (BUSPAR) 7.5 MG tablet Take 1 tablet (7.5 mg total) by mouth 2 (two) times daily. 02/17/22  Yes Saguier, Iris Pert  Cholecalciferol 50 MCG (2000 UT) CHEW Chew 1 tablet by mouth daily. 02/12/22  Yes [provider]  Coenzyme Q10 100 MG capsule Take 100 mg by mouth daily. 02/12/22  Yes [provider]  dapagliflozin propanediol (FARXIGA) 10 MG TABS tablet Take 1 tablet (10 mg total) by mouth daily before breakfast. 10/14/21  Yes [place], Maricela Bo, FNP  dexlansoprazole (DEXILANT) 60 MG capsule Take 1 capsule (60 mg total) by mouth daily. 05/09/22  Yes Jackquline Denmark, MD  famotidine (PEPCID) 40 MG tablet Take 1 tablet (40 mg total) by mouth 2 (two) times daily. 05/11/22  Yes  Jackquline Denmark, MD  ferrous sulfate 325 (65 FE) MG EC tablet Take 325 mg by mouth every other day.   Yes [provider]  KRILL OIL PO Take 800 mg by mouth every other day. In the morning   Yes [provider]  midodrine (PROAMATINE) 5 MG tablet Take 5 mg by mouth 3 (three) times daily with meals.   Yes [provider]  ondansetron (ZOFRAN) 8 MG tablet Take 1 tablet (8 mg total) by mouth every 8 (eight) hours as needed for nausea or vomiting. 12/20/21  Yes Marrian Salvage, FNP   OPSUMIT 10 MG tablet Take 10 mg by mouth daily. 03/14/22  Yes [provider]  potassium chloride (KLOR-CON M) 10 MEQ tablet Take 40 mEq by mouth 3 (three) times daily. 03/11/22  Yes [provider]  RABEprazole (ACIPHEX) 20 MG tablet Take 1 tablet (20 mg total) by mouth daily. 02/21/22  Yes Jackquline Denmark, MD  REFRESH TEARS 0.5 % SOLN Place 1 drop into both eyes daily as needed (dry eyes). 02/08/22  Yes [provider]  Selexipag 200 MCG TABS Take 1 tablet (200 mcg total) by mouth 2 (two) times daily. Patient taking differently: Take 200-400 mcg by mouth 2 (two) times daily. Take 400 mcg in the morning & 200 mcg at bedtime 02/04/22  Yes Arrien, Jimmy Picket, MD  spironolactone (ALDACTONE) 25 MG tablet Take 25 mg by mouth daily.   Yes [provider]  tadalafil (CIALIS) 20 MG tablet Take 20 mg by mouth daily as needed for erectile dysfunction. 02/06/22  Yes [provider]  torsemide (DEMADEX) 20 MG tablet Take 40-60 mg by mouth as directed. Take 3 tablets (60 mg) in the morning & Take 2 tablets (40 mg) in the evening   Yes [provider]  Turmeric 500 MG TABS Take 1 tablet by mouth daily in the afternoon.   Yes [provider]  vitamin B-12 (CYANOCOBALAMIN) 1000 MCG tablet Take 1,000 mcg by mouth daily at 12 noon.   Yes [provider]  warfarin (COUMADIN) 5 MG tablet Take 2.5-5 mg by mouth as directed. Take 1 tablet (5 mg) on (Mon, Wed, Sat) & Take 1/2 tablet (2.5 mg) all other days   Yes [provider]  Zinc 50 MG TABS Take 50 mg by mouth every other day. In the afternoon   Yes [provider]  ferrous sulfate 325 (65 FE) MG tablet Take 1 tablet (325 mg total) by mouth every other day. 01/31/22 05/01/22  [place] Donnamarie Poag, DO  rosuvastatin (CRESTOR) 5 MG tablet Take 1 tablet (5 mg total) by mouth daily. 01/31/22 05/01/22  [place] Eric J, DO  Spacer/Aero-Holding Chambers ([place]) [place]  01/21/22   [provider]  spironolactone (ALDACTONE) 25 MG tablet Take 1 tablet (25 mg total) by mouth daily. 02/04/22 05/05/22  Arrien, Jimmy Picket, MD  tiZANidine (ZANAFLEX) 2 MG tablet Take 1 tablet (2 mg total) by mouth every 8 (eight) hours as needed for muscle spasms. Patient not taking: Reported on 05/17/2022 04/06/22   Mcarthur Rossetti, MD  torsemide (DEMADEX) 20 MG tablet Take 1 tablet (20 mg total) by mouth daily. Patient taking differently: Take 40 mg by mouth 2 (two) times daily. 02/04/22 05/05/22  Arrien, Jimmy Picket, MD  warfarin (COUMADIN) 5 MG tablet See admin instructions. Take a full tablet 2 days a week and 1/2 tablet 5 days a week. 01/31/22 05/01/22  [place] Eric J, DO   Allergies  Allergen Reactions   Augmentin [Amoxicillin-Pot Clavulanate] Anaphylaxis and Diarrhea    "Upset stomach"   Amoxicillin     Other reaction(s): Nausea/Vomiting   Chicken Allergy Other (See Comments)    Other reaction(s): Unknown   Clavulanic Acid     Other reaction(s): Nausea/Vomiting   Pantoprazole Sodium Nausea Only    Gets gassy and starting itching like crazy Gets gassy and starting itching like crazy   Tizanidine     Other reaction(s): See Comments   Valium [Diazepam] Other (See Comments)    HA and Abd pain.   Tape Rash and Other (See Comments)    Surgical tape   Wound Dressing Adhesive Other (See Comments) and Rash    Surgical tape Surgical tape Surgical tape   Review of Systems Denies complaints  Physical Exam General: Alert, awake, in no acute distress.  Frail HEENT: No bruits, no goiter, no JVD Heart: Regular rate and rhythm. No murmur appreciated. Lungs: Good air movement, clear Abdomen: Soft, nontender, nondistended, positive bowel sounds.   Ext: No significant edema Skin: Warm and dry Neuro: Grossly intact, nonfocal.   Vital Signs: BP (!) 99/58 (BP Location: Left Arm)   Pulse 67   Temp 97.8 F (36.6 C) (Oral)   Resp 15   Ht 5' 9" (1.753 m)   Wt 58.7 kg   SpO2  92%   BMI 19.11 kg/m  Pain Scale: 0-10   Pain Score: 0-No pain   SpO2: SpO2: 92 % O2 Device:SpO2: 92 % O2 Flow Rate: .O2 Flow Rate (L/min): 2 L/min  IO: Intake/output summary:  Intake/Output Summary (Last 24 hours) at 05/24/2022 1618 Last data filed at 05/24/2022 1000 Gross per 24 hour  Intake 400 ml  Output 550 ml  Net -150 ml    LBM: Last BM Date : 05/24/22 Baseline Weight: Weight: 52 kg Most recent weight: Weight: 58.7 kg     Palliative Assessment/Data:   Flowsheet Rows    Flowsheet Row Most Recent Value  Intake Tab   Referral Department Hospitalist  Unit at Time of Referral ICU  Palliative Care Primary Diagnosis Cardiac  Date Notified 05/23/22  Palliative Care Type Return patient Palliative Care  Reason for referral Clarify Goals of Care  Date of Admission 05/17/22  Date first seen by Palliative Care 05/24/22  # of days Palliative referral response time 1 Day(s)  # of days IP prior to Palliative referral 6  Clinical Assessment   Palliative Performance Scale Score 40%  Psychosocial & Spiritual Assessment   Palliative Care Outcomes   Patient/Family meeting held? Yes  Who was at the meeting? Patient       Time Total: 75 minutes Greater than 50%  of this time was spent counseling and coordinating care related to the above assessment and plan.  Signed by: Micheline Rough, MD   Please contact Palliative Medicine Team phone at [phone] for questions and concerns.  For individual provider: See Shea Evans

## 2022-05-24 NOTE — Progress Notes (Addendum)
Progress Note  Patient Name: Lance Hicks Date of Encounter: 05/24/2022  [facility] Cardiologist: None   Subjective   Per chart review, patient was evaluated by speech pathologist and patient will aspirate regardless of consistency. He is now NPO except for ice chips.    On interview, patient is oriented to person, not place or time. Denies any chest pain, sob.   Inpatient Medications    Scheduled Meds:  (feeding supplement) PROSource Plus  30 mL Oral Daily   busPIRone  7.5 mg Oral BID   Chlorhexidine Gluconate Cloth  6 each Topical Daily   docusate sodium  100 mg Oral BID   famotidine  40 mg Oral QHS   feeding supplement  237 mL Oral BID BM   macitentan  10 mg Oral Daily   mouth rinse  15 mL Mouth Rinse BID   midodrine  5 mg Oral TID WC   multivitamin with minerals  1 tablet Oral Daily   mupirocin ointment  1 application. Nasal BID   pantoprazole (PROTONIX) IV  40 mg Intravenous Q12H   polyethylene glycol  17 g Oral BID   rosuvastatin  5 mg Oral Daily   Selexipag  200 mcg Oral QHS   And   Selexipag  400 mcg Oral Daily   sodium chloride flush  3 mL Intravenous Q12H   sodium chloride  1 g Oral BID WC   sucralfate  1 g Oral BID   tadalafil  20 mg Oral Daily   Warfarin - Pharmacist Dosing Inpatient   Does not apply q1600   Continuous Infusions:  PRN Meds: acetaminophen **OR** acetaminophen, hydrALAZINE, ipratropium-albuterol, metoprolol tartrate, ondansetron **OR** ondansetron (ZOFRAN) IV, senna-docusate, sodium chloride, traZODone   Vital Signs    Vitals:   05/24/22 0500 05/24/22 0600 05/24/22 0700 05/24/22 0800  BP:    95/64  Pulse: 70 64 61 65  Resp: _0 Temp:    98 F (36.7 C)  TempSrc:    Axillary  SpO2: 93% 96% 96% 95%  Weight:      Height:        Intake/Output Summary (Last 24 hours) at 05/24/2022 0845 Last data filed at 05/24/2022 0354 Gross per 24 hour  Intake 360 ml  Output 650 ml  Net -290 ml      05/23/2022    5:00 AM  05/22/2022    4:36 AM 05/21/2022    5:00 AM  Last 3 Weights  Weight (lbs) 129 lb 6.6 oz 97 lb 14.2 oz 97 lb 14.2 oz  Weight (kg) 58.7 kg 44.4 kg 44.4 kg      Telemetry    Atrial fibrillation, HR in the 60s-70s  - Personally Reviewed  ECG    No new tracings since 5/25 - Personally Reviewed  Physical Exam   GEN: Frail, chronically ill gentleman in no acute distress. Wearing [place] at 2 L/min   Neck: No JVD Cardiac: Irregular rate and rhythm, grade 2/6 systolic murmur  Respiratory: Clear to auscultation bilaterally. No increased WOB  GI: Soft, nontender, non-distended  MS: No edema; No deformity. Neuro:  Nonfocal  Psych: Normal affect   Labs    High Sensitivity Troponin:  No results for input(s): TROPONINIHS in the last 720 hours.   Chemistry Recent Labs  Lab 05/17/22 1959 05/18/22 5625 05/19/22 0251 05/21/22 0309 05/22/22 0300 05/23/22 0233 05/24/22 0256  NA 126* 127*   < > 125* 126* 125* 127*  K 3.6 3.3*   < >  4.3 4.5 4.7 4.6  CL 86* 90*   < > 93* 94* 93* 94*  CO2 26 26   < > _0 GLUCOSE 79 49*   < > 94 81 71 79  BUN 64* 61*   < > 39* 39* 36* 31*  CREATININE 1.86* 1.72*   < > 1.61* 1.72* 1.51* 1.29*  CALCIUM 9.6 9.5   < > 9.5 9.6 9.4 9.9  MG  --  2.2   < > 2.3 2.1 2.0 2.0  PROT 7.8 7.2  --  7.1  --   --   --   ALBUMIN 3.5 3.2*  --  3.2*  --   --   --   AST 33 29  --  27  --   --   --   ALT 21 18  --  17  --   --   --   ALKPHOS 74 67  --  71  --   --   --   BILITOT 3.6* 3.3*  --  3.2*  --   --   --   GFRNONAA 37* 41*   < > 44* 41* 48* 58*  ANIONGAP 14 11   < > _1 < > = values in this interval not displayed.    Lipids No results for input(s): CHOL, TRIG, HDL, LABVLDL, LDLCALC, CHOLHDL in the last 168 hours.  Hematology Recent Labs  Lab 05/22/22 0300 05/23/22 0233 05/24/22 0256  WBC 3.7* 3.5* 3.0*  RBC 3.49* 3.56* 3.64*  HGB 8.6* 8.6* 8.9*  HCT 27.1* 27.6* 28.7*  MCV 77.7* 77.5* 78.8*  MCH 24.6* 24.2* 24.5*  MCHC 31.7 31.2 31.0  RDW 19.9*  20.6* 21.2*  PLT 117* 110* 128*   Thyroid No results for input(s): TSH, FREET4 in the last 168 hours.  BNPNo results for input(s): BNP, PROBNP in the last 168 hours.  DDimer No results for input(s): DDIMER in the last 168 hours.   Radiology    DG Swallowing Func-Speech Pathology  Result Date: 05/23/2022 Table formatting from the original result was not included. Images from the original result were not included. 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 In light of concurrent left nephrolithiasis, will check CT renal colic next year instead of renal US.   Anticoagulated on Coumadin 01/04/2018  Anxiety 05/10/2016  Last Assessment & Plan:  Doing well off of zoloft.  Asthma   Asymptomatic microscopic hematuria 06/20/2017  Last Assessment & Plan:  Had hematuria workup in [facility] in 2016 which negative CT and cystoscopy. UA with 2+ blood last visit - we discussed recommendation for repeat workup at 5 years or if degree of hematuria progresses.   Atrial fibrillation ([place]) 03/29/2017  Atrial flutter ([place]) 03/29/2017  Backache 12/14/2015  Last Assessment & Plan:  Pain management referral for further evaluation.  Bilateral pleural effusion 08/09/2017  CHF (congestive heart failure) (HCC)   Chronic allergic rhinitis 04/28/2016  Last Assessment & Plan:  Continue astelin  Chronic anticoagulation  03/29/2017  Chronic atrial fibrillation ([place]) 07/23/2015  Last Assessment & Plan:  Coumadin and metoprolol, cardiology referral to  establish care.  Chronic midline back pain 12/14/2015  Last Assessment & Plan:  Pain management referral for further evaluation.  Chronic obstructive lung disease ([place]) 06/12/2016  With hypoxia  Chronic prostatitis 07/23/2015  Last Assessment & Plan:  Has largely resolved since stopping bike riding. Recommend annual DRE AND PSA - will see back 12/2015 for annual screening, given 1st degree fhx. To call office for recurrent prostatitis symptoms.   Chronic respiratory failure with hypoxia ([place]) 07/29/2019  Cirrhosis of liver not due to alcohol ([place]) 07/01/2019  COPD (chronic obstructive pulmonary disease) ([place]) 06/12/2016  Coronary arteriosclerosis in native artery 03/29/2017  Coronary artery disease involving native coronary artery of native heart with angina pectoris ([place]) 03/29/2017  Cough 10/17/2016  Last Assessment & Plan:  Discussed typical course for acute viral illness. If symptoms worsen or fail to improve by 7-10d, delayed ATBs, fluids, rest, NSAIDs/APAP prn. Seek care if not improving. Needs earlier INR check due to ATBs.  Dyspnea 02/01/2016  Last Assessment & Plan:  Overall improving, eval by pulm, plan for CT, neg stress test with cardiology. Recent switch to carvedilol due to side effects.  Encounter for therapeutic drug monitoring 01/06/2019  Enterococcal bacteremia   Epidermoid cyst of skin 08/24/2017  Essential hypertension 12/14/2015  Last Assessment & Plan:  Hypertension control: controlled  Medications: compliant Medication Management: as noted in orders Home blood pressure monitoring recommended additionally as needed for symptoms  The patient's care plan was reviewed and updated. Instructions and counseling were provided regarding patient goals and barriers. He was counseled to adopt a healthy lifestyle. Educational resources and self-management tools have been provided as charted in [facility] list.   H/O maze procedure 03/29/2017  [name] 03/29/2017  Overview:  2011  History of coronary  artery bypass graft 03/29/2017  Hx of CABG 03/29/2017  Hyperlipidemia 03/29/2017  Hypersensitivity angiitis ([place]) 10/01/2017  Hypertensive heart disease 03/29/2017  Hypertensive heart disease with heart failure ([place]) 03/29/2017  Hypertensive heart failure ([place]) 03/29/2017  Hypokalemia due to excessive renal loss of potassium 02/18/2018  Hypotension, chronic 06/06/2018  International normalized ratio (INR) raised 07/26/2017  Kidney stone 07/23/2015  Kidney stones 07/23/2015  Overview:  x 3  Last Assessment & Plan:  By [place] has left nephrolithiasis, but not visible by KUB. Will check CT renal colic next year to assess both stone burden as well as to surveil AML.   Left ureteral stone 01/23/2018  Leukocytoclastic vasculitis ([place]) 10/01/2017  Localized edema 01/04/2018  Long term (current) use of anticoagulants 03/29/2017  Lumbar radicular pain 01/19/2016  Lumbar radiculopathy 01/19/2016  Maculopapular rash 09/03/2017  Microscopic hematuria 06/20/2017  Last Assessment & Plan:  Had hematuria workup in [facility] in 2016 which negative CT and cystoscopy. UA with 2+ blood last visit - we discussed recommendation for repeat workup at 5 years or if degree of hematuria progresses.   Multiple nodules of lung 06/12/2016  Nasal discharge 02/25/2016  Last Assessment & Plan:  Trial zyrtec and flonase  Nephrolithiasis 07/23/2015  Overview:  x 3  Last Assessment & Plan:  By [place] has left nephrolithiasis, but not visible by KUB. Will check CT renal colic next year to assess both stone burden as well as to surveil AML.   Overview:  x 3  Last Assessment & Plan:  Has 46m nonobstructing LUP stone - not visible by KUB.  Will check renal [place]  07/2018 - he will contact office sooner if symptomatic.   Non-sustained ventricular tachycardia ([place]) 03/29/2017  Nonsustained ventricular tachycardia ([place]) 03/29/2017  Other hyperlipidemia 03/29/2017  Palpitations 10/01/2017  Pleural effusion, bilateral 08/09/2017  Pneumonia due to COVID-19 virus 02/07/2021  Post-nasal drainage 02/25/2016  Last Assessment &  Plan:  Trial zyrtec and flonase  Prostate cancer screening 06/20/2017  Last Assessment & Plan:  Recommend continued annual CaP screening until within 10 years of life expectancy. Given good health and fhx of longevity, would anticipate CaP screening to continue until age 26.  PSA today and again in one year on day of visit.  Pulmonary arterial hypertension ([place]) 02/20/2018  Pulmonary edema   Pulmonary hypertension ([place]) 08/09/2017  Pulmonary nodules 06/12/2016  S/P AVR 03/20/2016  S/P AVR (aortic valve replacement) 03/20/2016  Strain of deltoid muscle, initial encounter   Supratherapeutic INR 07/26/2017  Syncope and collapse 02/01/2016  Typical atrial flutter ([place]) 02/01/2016 Past Surgical History: Past Surgical History: Procedure Laterality Date  CHOLECYSTECTOMY    CORONARY ARTERY BYPASS GRAFT    EXPLORATORY LAPAROTOMY  07/30/2017  FOOT SURGERY    FRACTURE SURGERY Right   wrist and forearm  HERNIA REPAIR    MECHANICAL AORTIC VALVE REPLACEMENT    NASAL SINUS SURGERY    RIGHT HEART CATH N/A 01/18/2018  Procedure: RIGHT HEART CATH;  Surgeon: Jolaine Artist, MD;  Location: [place] [facility];  Service: Cardiovascular;  Laterality: N/A;  RIGHT HEART CATH N/A 05/14/2018  Procedure: RIGHT HEART CATH;  Surgeon: Jolaine Artist, MD;  Location: [place] [facility];  Service: Cardiovascular;  Laterality: N/A;  RIGHT HEART CATH N/A 09/29/2021  Procedure: RIGHT HEART CATH;  Surgeon: Jolaine Artist, MD;  Location: [place] [facility];  Service: Cardiovascular;  Laterality: N/A;  TEE WITHOUT CARDIOVERSION N/A 05/21/2018  Procedure: TRANSESOPHAGEAL ECHOCARDIOGRAM (TEE);  Surgeon: Jolaine Artist, MD;  Location: [facility];  Service: Cardiovascular;  Laterality: N/A;  UPPER GASTROINTESTINAL ENDOSCOPY  07/12/2017  Patchy areas of mucosal inflammation noted in the antrum with edema,erthema and ulcerations. Bx. Chronicfocally active gastritis.  VASECTOMY   HPI: Pt is a 76 yo male transferred from [place] with AKI and CKD3,  hyponatremia, acute on chronic anemia.  Has history of dysphagia,  recent nausea/vomiting, GERD, anemia, cirrhosis, CHF, COPD,  pulmonary hypertension, coronary artery disease with prior CABG and  mechanical AVR, chronic respiratory failure, aortic stenosis and  atrial fibrillation. History of EGD in 2018 with gastritis. Hgb stable, pt on coumadin.  Hospital coarse compicated by delirium.  RN noted pt coughing with intake of liquids and SLP swallow eval thus ordered.  Per notes in chart, pt also has FTT.  UGI study 5/10 showed aspiration episode x1 per radiologist note -- SLP reviewed flouro loop and pt also had pharyngeal retention.  Pt reports h/o sensing retention in throat and esophagus and chronic cough since he had COVID this year.   He does endorse improvement with being on reflux medications.  Has had pna twice since COVID per pt.  Eats 2 meals a day.  Denies need for heimlich manuever.  Subjective: pt awake in chair  Recommendations for follow up therapy are one component of a multi-disciplinary discharge planning process, led by the attending physician.  Recommendations may be updated based on patient status, additional functional criteria and insurance authorization. Assessment / Plan / Recommendation   05/23/2022   3:56 PM Clinical Impressions Clinical Impression Pt given limited barium including thin, nectar and single bolus of puree.  Moderate  oral dysphagia characterized by weakness - causing pt to expend significant effort to elicit oral transiting with delay. He tends to dip head forward to help elicit a swallow and admits it requires effort.   Severe pharyngeal dysphagia evidenced by poor pharyngeal motility resulting in retention throughout pharynx with all consistencies that mixed with secretions.  Retention did not clear despite liquid swallow and cued dry swallows *cued dry swallows inconsistently conducted and appeared to fatigue pt.  Cued "hock" was not produced despite demonstration from SLP  x2 and thus pt did not clear vallecular retention.  Aspiration of nectar observed due to poor motility and did not clear despite reflexive cough.  HOB reclined and head turn right/left did not prevent or facilitate pharyngeal clearance. Following MBS, pt admitted to chronic problems swallowing.   Pt will aspirate regardless of consistency he consumes and retention is worse with increased visocity.     Discussed with pt severity of his dysphagia with aspiration (Of even secretions).  He advised that he NOT want to be a full code.        Recommend consider palliative consult to establish [place] given pt's deconditioning and level of dysphagia.  Recommend NPO x ice chips and change medications to IV (that can be changed).  PO Intake for comfort with accepted risks may align with pt's goals.  Prognosis for swallow function to return is guarded given chronicity of and severity of dysphagia. SLP Visit Diagnosis Dysphagia, oropharyngeal phase (R13.12) Impact on safety and function Risk for inadequate nutrition/hydration;Severe aspiration risk     05/23/2022   3:56 PM Treatment Recommendations Treatment Recommendations Therapy as outlined in treatment plan below     05/23/2022   3:58 PM Prognosis Prognosis for Safe Diet Advancement Guarded Barriers to Reach Goals Time post onset;Severity of deficits   05/23/2022   3:56 PM Diet Recommendations SLP Diet Recommendations Ice chips PRN after oral care Medication Administration Via alternative means     05/23/2022   3:56 PM Other Recommendations Oral Care Recommendations Oral care BID Other Recommendations Have oral suction available Follow Up Recommendations Other (comment) Functional Status Assessment Patient has had a recent decline in their functional status and/or demonstrates limited ability to make significant improvements in function in a reasonable and predictable amount of time    View : No data to display.        05/23/2022   3:49 PM Oral Phase Oral Phase Impaired Oral -  Nectar Teaspoon Decreased bolus cohesion Oral - Nectar Cup Decreased bolus cohesion;Weak lingual manipulation;Premature spillage;Reduced posterior propulsion Oral - Nectar Straw Decreased bolus cohesion;Weak lingual manipulation;Premature spillage;Reduced posterior propulsion Oral - Thin Teaspoon Decreased bolus cohesion;Weak lingual manipulation;Premature spillage;Reduced posterior propulsion Oral - Thin Cup Decreased bolus cohesion;Weak lingual manipulation;Premature spillage;Reduced posterior propulsion Oral - Thin Straw Decreased bolus cohesion;Weak lingual manipulation;Premature spillage;Reduced posterior propulsion Oral - Puree Premature spillage;Reduced posterior propulsion    05/23/2022   3:50 PM Pharyngeal Phase Pharyngeal Phase Impaired Pharyngeal- Nectar Teaspoon Reduced pharyngeal peristalsis;Reduced epiglottic inversion;Pharyngeal residue - valleculae;Pharyngeal residue - pyriform;Reduced airway/laryngeal closure;Reduced tongue base retraction;Delayed swallow initiation-vallecula Pharyngeal Material does not enter airway Pharyngeal- Nectar Cup Delayed swallow initiation-vallecula;Reduced epiglottic inversion;Reduced pharyngeal peristalsis;Reduced tongue base retraction;Reduced airway/laryngeal closure;Pharyngeal residue - pyriform;Pharyngeal residue - valleculae;Moderate aspiration;Penetration/Aspiration during swallow Pharyngeal Material enters airway, passes BELOW cords and not ejected out despite cough attempt by patient Pharyngeal- Nectar Straw Delayed swallow initiation-vallecula;Reduced epiglottic inversion;Reduced anterior laryngeal mobility;Reduced laryngeal elevation;Reduced airway/laryngeal closure;Reduced tongue base retraction;Pharyngeal residue - valleculae;Pharyngeal residue - pyriform Pharyngeal Material does not  enter airway Pharyngeal- Thin Teaspoon Pharyngeal residue - pyriform;Reduced pharyngeal peristalsis;Reduced epiglottic inversion;Reduced anterior laryngeal mobility;Reduced  laryngeal elevation;Reduced airway/laryngeal closure;Reduced tongue base retraction;Delayed swallow initiation-vallecula;Lateral channel residue Pharyngeal Material does not enter airway Pharyngeal- Thin Cup Delayed swallow initiation-vallecula;Reduced pharyngeal peristalsis;Reduced epiglottic inversion;Reduced anterior laryngeal mobility;Reduced laryngeal elevation;Reduced airway/laryngeal closure;Reduced tongue base retraction;Pharyngeal residue - valleculae;Pharyngeal residue - pyriform;Lateral channel residue Pharyngeal Material does not enter airway Pharyngeal- Thin Straw Delayed swallow initiation-vallecula;Reduced pharyngeal peristalsis;Reduced epiglottic inversion;Reduced anterior laryngeal mobility;Reduced laryngeal elevation;Reduced tongue base retraction;Lateral channel residue Pharyngeal Material does not enter airway Pharyngeal- Puree Reduced epiglottic inversion;Reduced pharyngeal peristalsis;Reduced tongue base retraction;Pharyngeal residue - valleculae;Pharyngeal residue - pyriform Pharyngeal Material does not enter airway    05/23/2022   3:56 PM Cervical Esophageal Phase  Cervical Esophageal Phase Impaired  [place], [place] [facility] SLP Acute Rehab Services Office [phone] Pager [phone] Macario Golds 05/23/2022, 4:00 PM                      Cardiac Studies   Echocardiogram 09/30/21  1. Left ventricular ejection fraction, by estimation, is 45 to 50%. The  left ventricle has mildly decreased function. The left ventricle  demonstrates global hypokinesis. There is mild left ventricular  hypertrophy. Left ventricular diastolic parameters  are consistent with Grade II diastolic dysfunction (pseudonormalization).   2. Right ventricular systolic function is low normal. The right  ventricular size is mildly enlarged.   3. Left atrial size was mildly dilated.   4. Right atrial size was severely dilated.   5. The mitral valve is normal in structure. No evidence of mitral valve  regurgitation.  No evidence of mitral stenosis. Severe mitral annular  calcification.   6. Tricuspid valve regurgitation is moderate to severe.   7. The aortic valve has been repaired/replaced. Aortic valve  regurgitation is not visualized. No aortic stenosis is present. There is a  [employer] bileaflet valve present in the aortic position. Procedure  Date: 2010. Echo findings are consistent with  normal structure and function of the aortic valve prosthesis. Aortic valve  mean gradient measures 9.7 mmHg. Aortic valve Vmax measures 2.01 m/s.   8. There is Moderate (Grade III) plaque involving the ascending aorta.   9. The inferior vena cava is dilated in size with <50% respiratory  variability, suggesting right atrial pressure of 15 mmHg.   Patient Profile     76 y.o. male with a past medical history significant for CHF (followed closely by CHF clinic), chronic atrial fibrillation, mechanical AVR on coumadin, CABG in 2010, COPD, ILD, pulmonary HTN, CKD stage IIIa, chronic hypotension on midodrine, hepatic cirrhosis. Patient was transferred for [facility] for malaise, fatigue, poor PO intake, and possible GI bleed with hemoglobin 8.3.   Assessment & Plan    Chronic combined systolic/diastolic heart failure  - Most recent echocardiogram from 09/2021 showed EF 45-50%, grade II diastolic dysfunction. Patient is followed closely by the CHF clinic - Appeared hypovolemic on admission in the setting of poor PO intake, received IV fluids but has not received any since 5/28 - Patient is not on BB due to history of bradycardia/pauses. Not on ACE/ARB/ARNI due to CKD - Spironolactone and torsemide held as patient hypvolemic and has AKI/CKD. Creatinine has been improving   - On midodrine for BP support  - Weights have been unreliable this admission, patient is currently net +1.3 L this admission. Patient appears euvolemic on exam, if not still a little dry    S/p AVR  - Echo from 09/2021 showed  normal structure and  function of aortic valve prosthesis  - Chronically on warfarin-- dose managed per pharmacy this admission, appreciate their assistance  - Goal INR 2.5-3, INR 4.0 today so warfarin will be held - Suspect congestive hepatopathy causes some degree of "auto anticoagulation" and will affect INR    Chronic Atrial fibrillation  PVCs - s/p MAZE and LAA clipping  - Rate is well controlled off of rate controlling medications-- per telemetry, HR is consistently in the 60s   - Continue warfarin as above  - PRN lopressor IV for elevated HR  - Patient has PVCs on telemetry and 2 episodes of 3-beat runs of NSVT (5:20 PM yesterday, 7:39 AM today) Maintain K>4, mag >2   Pulmonary HTN  - WHO class 2/3 - Continue Tadalafil, selexipag, and opsumit as tolerated    AKI on CKD stage IIIa  - Baseline creatinine around 1.47. Presented with creatinine 2.38, now improved to 1.29  - Holding home torsemide, spironolactone  - Was given IV fluids earlier this admission, currently not receiving IV hydration - Continue to avoid nephrotoxic medications    CAD s/p CABG 2010  - Denies any chest pain - Continue statin  - Not on BB due to bradycardia, not on ASA due to coumadin use    Otherwise per primary  - Acute metabolic encephalopathy (resolved)  - Chronic hyponatremia  - Failure to thrive in an adult - Acute on chronic anemia, severe iron deficiency  - COPD      For questions or updates, please contact [facility] Please consult [email or web address] for contact info under        Signed, Margie Billet, PA-C  05/24/2022, 8:46 AM

## 2022-05-24 NOTE — Progress Notes (Signed)
[place] for Warfarin Indication: Mechanical AVR and Afib  Allergies  Allergen Reactions   Augmentin [Amoxicillin-Pot Clavulanate] Anaphylaxis and Diarrhea    "Upset stomach"   Amoxicillin     Other reaction(s): Nausea/Vomiting   Chicken Allergy Other (See Comments)    Other reaction(s): Unknown   Clavulanic Acid     Other reaction(s): Nausea/Vomiting   Pantoprazole Sodium Nausea Only    Gets gassy and starting itching like crazy Gets gassy and starting itching like crazy   Tizanidine     Other reaction(s): See Comments   Valium [Diazepam] Other (See Comments)    HA and Abd pain.   Tape Rash and Other (See Comments)    Surgical tape   Wound Dressing Adhesive Other (See Comments) and Rash    Surgical tape Surgical tape Surgical tape    Patient Measurements: Height: _0  (175.3 cm) Weight: 58.7 kg (129 lb 6.6 oz) IBW/kg (Calculated) : 70.7  Vital Signs: Temp: 97.3 F (36.3 C) (05/31 0400) Temp Source: Oral (05/31 0400) BP: 103/60 (05/31 0300) Pulse Rate: 61 (05/31 0700)  Labs: Recent Labs    05/22/22 0300 05/23/22 0233 05/24/22 0256  HGB 8.6* 8.6* 8.9*  HCT 27.1* 27.6* 28.7*  PLT 117* 110* 128*  LABPROT 25.1* 32.4* 38.5*  INR 2.3* 3.2* 4.0*  CREATININE 1.72* 1.51* 1.29*     Estimated Creatinine Clearance: 41.1 mL/min (A) (by C-G formula based on SCr of 1.29 mg/dL (H)).  PTA warfarin dose 2.7m daily except 5102mMon/Wed/Sat - last dose taken 5/22  Assessment: [age]o male with hx mechanical aortic valve and afib on chronic warfarin admitted with acute on chronic anemia.  Warfarin has been held since admission and GI following.  Pharmacy has been consulted to resume warfarin since GI is not planning any intervention at this time as he is deemed to be high risk and Hgb is now stable.  Today, 05/24/2022 INR with large increase to 4, supratherapeutic despite low dose yesterday CBC: Hgb low/stable at 8.9, Plts 128 (low but  improved from 80-90s on admit) AST/ALT WNL, Tbili elevated 3.2 (last on 5/28) Scr down to 1.29 Meal intake remains low (20% of meals)  Goal of Therapy:  INR 2.5-3 per outpatient clinic notes   Plan:  HOLD warfarin today Daily PT/INR Monitor closely for signs/symptoms of bleeding  ChGretta ArabharmD, BCPS Clinical Pharmacist [facility] main pharmacy [phone]/31/2023 7:27 AM

## 2022-05-25 DIAGNOSIS — I5042 Chronic combined systolic (congestive) and diastolic (congestive) heart failure: Secondary | ICD-10-CM | POA: Diagnosis not present

## 2022-05-25 DIAGNOSIS — Z7901 Long term (current) use of anticoagulants: Secondary | ICD-10-CM | POA: Diagnosis not present

## 2022-05-25 DIAGNOSIS — Z7189 Other specified counseling: Secondary | ICD-10-CM | POA: Diagnosis not present

## 2022-05-25 DIAGNOSIS — R1310 Dysphagia, unspecified: Secondary | ICD-10-CM

## 2022-05-25 DIAGNOSIS — N170 Acute kidney failure with tubular necrosis: Secondary | ICD-10-CM | POA: Diagnosis not present

## 2022-05-25 DIAGNOSIS — I482 Chronic atrial fibrillation, unspecified: Secondary | ICD-10-CM | POA: Diagnosis not present

## 2022-05-25 LAB — PROTIME-INR
INR: 3.3 — ABNORMAL HIGH (ref 0.8–1.2)
Prothrombin Time: 33.1 seconds — ABNORMAL HIGH (ref 11.4–15.2)

## 2022-05-25 LAB — BASIC METABOLIC PANEL
Anion gap: 9 (ref 5–15)
BUN: 31 mg/dL — ABNORMAL HIGH (ref 8–23)
CO2: 23 mmol/L (ref 22–32)
Calcium: 10.2 mg/dL (ref 8.9–10.3)
Chloride: 98 mmol/L (ref 98–111)
Creatinine, Ser: 1.33 mg/dL — ABNORMAL HIGH (ref 0.61–1.24)
GFR, Estimated: 56 mL/min — ABNORMAL LOW (ref >60)
Glucose, Bld: 90 mg/dL (ref 70–99)
Potassium: 4.3 mmol/L (ref 3.5–5.1)
Sodium: 130 mmol/L — ABNORMAL LOW (ref 135–145)

## 2022-05-25 LAB — MAGNESIUM: Magnesium: 2.2 mg/dL (ref 1.7–2.4)

## 2022-05-25 MED ORDER — ONDANSETRON HCL 4 MG PO TABS: 4.0000 mg | ORAL_TABLET | Freq: Four times a day (QID) | ORAL | Status: DC | PRN

## 2022-05-25 MED ORDER — METOCLOPRAMIDE HCL 5 MG/ML IJ SOLN
5.0000 mg | Freq: Four times a day (QID) | INTRAMUSCULAR | Status: DC
  Administered 2022-05-25 – 2022-05-29 (×16): 5 mg via INTRAVENOUS
  Filled 2022-05-25 (×16): qty 2

## 2022-05-25 MED ORDER — WARFARIN SODIUM 1 MG PO TABS
1.0000 mg | ORAL_TABLET | Freq: Once | ORAL | Status: AC
  Administered 2022-05-25: 1 mg via ORAL
  Filled 2022-05-25: qty 1

## 2022-05-25 MED ORDER — SODIUM CHLORIDE 0.9 % IV SOLN: INTRAVENOUS | Status: DC

## 2022-05-25 MED ORDER — SODIUM CHLORIDE 0.9 % IV SOLN: INTRAVENOUS | Status: AC

## 2022-05-25 MED ORDER — SODIUM CHLORIDE 0.9 % IV SOLN
8.0000 mg | Freq: Four times a day (QID) | INTRAVENOUS | Status: DC | PRN
  Filled 2022-05-25: qty 4

## 2022-05-25 NOTE — Progress Notes (Signed)
SLP Cancellation Note  Patient Details Name: Lance Hicks MRN: [id number] DOB: 03-16-1946   Cancelled treatment:       Reason Eval/Treat Not Completed: Other (comment) (pt with palliative MD when SlP attempted to visit, pt now DNR and is po with accepted aspiration risks, recommend SLP follow up for dysphagia therapy, HEP in effort to improve swallow function/tolerance)   Kathleen Lime, MS [facility] SLP Acute Rehab Services Office [phone] Pager [phone]  Lance Hicks 05/25/2022, 3:28 PM

## 2022-05-25 NOTE — TOC Progression Note (Signed)
Transition of Care [facility]) - Progression Note    Patient Details  Name: Lance Hicks MRN: [id number] Date of Birth: [birth year]  [facility] CM/SW [place], [place] Phone Number: 05/25/2022, 11:08 AM  Clinical Narrative:    Met with pt to discuss recommendations for SNF placement. Pt began shaking his head no before CSW had finished discussing recommendations. Pt states he has had poor experiences with SNF in the past. Pt was unable to disclose where he received SNF in the past. Pt states he would like to return home and feels that he has enough support from his 3 neighbors who come to check in on him. When asked how often his supports are able to check on him and assist him he stated "too often." When asked if someone could remain in the house with him for 24/7 support pt adamantly declined this. Pt continued to refuse SNF placement however, is open to [facility]. TOC will continue to follow for discharge planning and further needs.    Expected Discharge Plan: Home/Self Care Barriers to Discharge: Continued Medical Work up  Expected Discharge Plan and Services Expected Discharge Plan: Home/Self Care   Discharge Planning Services: CM Consult   Living arrangements for the past 2 months: Single Family Home                                       Social Determinants of Health (SDOH) Interventions    Readmission Risk Interventions    05/22/2022   10:34 AM  Readmission Risk Prevention Plan  Medication Review (RN Care Manager) Complete  HRI or [place] Complete  SW Recovery Care/Counseling Consult Complete  [place] Not Applicable

## 2022-05-25 NOTE — Progress Notes (Signed)
[facility] PROGRESS NOTE    Progress Note  Lance Hicks  MLY:[id number] DOB: 1946/03/06 DOA: 05/17/2022 PCP: [name], PA-C     Brief Narrative:   Lance Hicks is an 76 y.o. male past medical history significant for chronic combined systolic and diastolic heart failure with an EF of 45%, CAD status post CABG, chronic atrial fibrillation and aortic stenosis status post mechanical aortic valve replacement on Coumadin, pulmonary hypertension, chronic kidney disease stage III yea, hepatic cirrhosis, anemia of chronic disease admitted to [facility] for acute kidney injury and hyponatremia, was seen by physician on May 2023 obtain blood work that showed a hemoglobin of 9 Hemoccult positive, transferred to [place] seen by [facility], Coumadin was initially held which was eventually restarted as no procedure was plan.  His renal function improved and is now close to baseline.  He is at moderate risk for aspiration and a barium swallow was recommended that showed severe pharyngeal dysphagia and poor pharyngeal mobility.  Patient will aspirate regardless of what he consumes.  Speech recommended n.p.o. except for ice chips.  His home dose of spironolactone and torsemide were held on admission due to acute kidney injury and soft blood pressure   Assessment/Plan:   Acute renal failure superimposed on stage 3a chronic kidney disease (HCC) Baseline creatinine 1.5-1.6 appears to be volume depleted on admission diuretics were held on admission as his creatinine was 2.3. Eventually his creatinine improved to 1.5 fluids. We will continue to hold diuretics until he is able to take orals regularly.  Recheck a basic metabolic panel tomorrow morning. He is about a liter and a half positive. Physical therapy evaluated the patient, he will need to go to skilled nursing facility. TOC has been consulted for skilled  At high risk for aspiration: Despite explaining the risk and  benefits to the patient he would like to proceed to and be full code. He would like his diet advanced. I have explained to him the risk and benefits plan of care has met with the patient and discussed the risk and benefits, he seems to minimize concerned about the situation they will continue to engage with him and involve his healthcare power of attorney as well as try to elicit long-term goals moving forward. We will put him on a dysphagia 2 diet.  Chronic hyponatremia He wants to be on a diet placed on a dysphagia 2 diet. See above for further details regarding his diet.  Acute on chronic anemia Baseline  Like around 11 denies any bleeding, hemoglobin of admission 8.9 FOBT positive GI was consulted who deemed him high risk for endoscopic procedure. Due to cardiopulmonary disease they recommended no further intervention. He was given IV iron. Hemoglobin has remained relatively stable. Recheck hemoglobin tomorrow morning.  Coronary artery disease involving native coronary artery of native heart with angina pectoris ([place]) Denies any chest pain continue statins and Coumadin.  Chronic combined systolic and diastolic CHF (congestive heart failure) ([place]) With an EF of 45%.  He is not on a beta-blocker due to his history of bradycardia and pauses, not a candidate for ACE inhibitor or ARB due to chronic renal disease. Torsemide and Aldactone were held due to his acute kidney injury, his creatinine has improved. He is currently on midodrine for support. Started on gentle fluids as above. Now positive by 1.5 L.  History of mechanical aortic valve: Coumadin was initially held now has been resumed, this morning is 4.0 we will have to hold Coumadin  for today.  Pulmonary arterial hypertension: Continue home selexipag, macitentan, tadalafil.  Chronic hypotension: Continue midodrine.  Chronic atrial fibrillation: Rate control now on Coumadin INR is elevated see above. Not on beta-blocker due  to his history of bradycardia  Severe protein caloric malnutrition Estimated body mass index is 19.11 kg/m as calculated from the following:   Height as of this encounter: _0  (1.753 m).   Weight as of this encounter: 58.7 kg. Malnutrition Type:  Nutrition Problem: Severe Malnutrition Etiology: chronic illness   Malnutrition Characteristics:  Signs/Symptoms: moderate muscle depletion, severe fat depletion, severe muscle depletion   Nutrition Interventions:  Interventions: Ensure Enlive (each supplement provides 350kcal and 20 grams of protein), MVI    DVT prophylaxis: Coumadin Family Communication:none Status is: Inpatient Remains inpatient appropriate because: Aspiration, acute kidney injury and worsening hyponatremia    Code Status:     Code Status Orders  (From admission, onward)           Start     Ordered   05/17/22 1932  Full code  Continuous        05/17/22 1933           Code Status History     Date Active Date Inactive Code Status Order ID Comments User Context   01/25/2022 1641 02/05/2022 1922 DNR [id number]  Lin Landsman, NP Inpatient   01/23/2022 1818 01/25/2022 1641 Full Code [id number]  Lequita Halt, MD ED   11/03/2021 1718 11/09/2021 1727 Full Code [id number]  Consuelo Pandy, PA-C Inpatient   09/29/2021 1430 10/04/2021 2049 Full Code [id number]  Jolaine Artist, MD Inpatient   05/14/2018 1145 05/23/2018 1709 Full Code [id number]  Gilliam, Hawkes, PA-C Inpatient   01/18/2018 1104 01/18/2018 1529 Full Code [id number]  Bensimhon, Shaune Pascal, MD Inpatient      Advance Directive Documentation    Flowsheet Row Most Recent Value  Type of Advance Directive Healthcare Power of Attorney  Pre-existing out of facility DNR order (yellow form or pink MOST form) --  "MOST" Form in Place? --         IV Access:   Peripheral IV   Procedures and diagnostic studies:   DG Swallowing Func-Speech Pathology  Result Date: 05/23/2022 Table  formatting from the original result was not included. Images from the original result were not included. Objective Swallowing Evaluation: Type of Study: MBS-Modified Barium Swallow Study  Patient Details Name: Lance Hicks MRN: [id number] Date of Birth: [birth year] Today's Date: 05/23/2022 Time: SLP Start Time (ACUTE ONLY): 1430 -SLP Stop Time (ACUTE ONLY): 1455 SLP Time Calculation (min) (ACUTE ONLY): 25 min Past Medical History: Past Medical History: Diagnosis Date  Allergic rhinitis 04/28/2016  Last Assessment & Plan:  Continue astelin  Anemia 07/01/2019  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  Last Assessment & Plan:  Stable in size on annual imaging. In light of concurrent left nephrolithiasis, will check CT renal colic next year instead of renal US.   Anticoagulated on Coumadin 01/04/2018  Anxiety 05/10/2016  Last Assessment & Plan:  Doing well off of zoloft.  Asthma   Asymptomatic microscopic hematuria 06/20/2017  Last Assessment & Plan:  Had hematuria workup in [facility] in 2016 which negative CT and cystoscopy. UA with 2+ blood last visit - we discussed recommendation for repeat workup at 5 years or if degree of hematuria progresses.   Atrial fibrillation ([place]) 03/29/2017  Atrial flutter ([place]) 03/29/2017  Backache 12/14/2015  Last Assessment & Plan:  Pain management referral for further  evaluation.  Bilateral pleural effusion 08/09/2017  CHF (congestive heart failure) (HCC)   Chronic allergic rhinitis 04/28/2016  Last Assessment & Plan:  Continue astelin  Chronic anticoagulation 03/29/2017  Chronic atrial fibrillation ([place]) 07/23/2015  Last Assessment & Plan:  Coumadin and metoprolol, cardiology referral to establish care.  Chronic midline back pain 12/14/2015  Last Assessment & Plan:  Pain management referral for further evaluation.  Chronic obstructive lung disease ([place]) 06/12/2016  With hypoxia  Chronic prostatitis 07/23/2015  Last Assessment & Plan:  Has largely resolved since stopping bike riding. Recommend annual DRE AND  PSA - will see back 12/2015 for annual screening, given 1st degree fhx. To call office for recurrent prostatitis symptoms.   Chronic respiratory failure with hypoxia ([place]) 07/29/2019  Cirrhosis of liver not due to alcohol ([place]) 07/01/2019  COPD (chronic obstructive pulmonary disease) ([place]) 06/12/2016  Coronary arteriosclerosis in native artery 03/29/2017  Coronary artery disease involving native coronary artery of native heart with angina pectoris ([place]) 03/29/2017  Cough 10/17/2016  Last Assessment & Plan:  Discussed typical course for acute viral illness. If symptoms worsen or fail to improve by 7-10d, delayed ATBs, fluids, rest, NSAIDs/APAP prn. Seek care if not improving. Needs earlier INR check due to ATBs.  Dyspnea 02/01/2016  Last Assessment & Plan:  Overall improving, eval by pulm, plan for CT, neg stress test with cardiology. Recent switch to carvedilol due to side effects.  Encounter for therapeutic drug monitoring 01/06/2019  Enterococcal bacteremia   Epidermoid cyst of skin 08/24/2017  Essential hypertension 12/14/2015  Last Assessment & Plan:  Hypertension control: controlled  Medications: compliant Medication Management: as noted in orders Home blood pressure monitoring recommended additionally as needed for symptoms  The patient's care plan was reviewed and updated. Instructions and counseling were provided regarding patient goals and barriers. He was counseled to adopt a healthy lifestyle. Educational resources and self-management tools have been provided as charted in [facility] list.   H/O maze procedure 03/29/2017  [name] 03/29/2017  Overview:  2011  History of coronary artery bypass graft 03/29/2017  Hx of CABG 03/29/2017  Hyperlipidemia 03/29/2017  Hypersensitivity angiitis ([place]) 10/01/2017  Hypertensive heart disease 03/29/2017  Hypertensive heart disease with heart failure ([place]) 03/29/2017  Hypertensive heart failure ([place]) 03/29/2017  Hypokalemia due to excessive renal loss of potassium 02/18/2018   Hypotension, chronic 06/06/2018  International normalized ratio (INR) raised 07/26/2017  Kidney stone 07/23/2015  Kidney stones 07/23/2015  Overview:  x 3  Last Assessment & Plan:  By [place] has left nephrolithiasis, but not visible by KUB. Will check CT renal colic next year to assess both stone burden as well as to surveil AML.   Left ureteral stone 01/23/2018  Leukocytoclastic vasculitis ([place]) 10/01/2017  Localized edema 01/04/2018  Long term (current) use of anticoagulants 03/29/2017  Lumbar radicular pain 01/19/2016  Lumbar radiculopathy 01/19/2016  Maculopapular rash 09/03/2017  Microscopic hematuria 06/20/2017  Last Assessment & Plan:  Had hematuria workup in [facility] in 2016 which negative CT and cystoscopy. UA with 2+ blood last visit - we discussed recommendation for repeat workup at 5 years or if degree of hematuria progresses.   Multiple nodules of lung 06/12/2016  Nasal discharge 02/25/2016  Last Assessment & Plan:  Trial zyrtec and flonase  Nephrolithiasis 07/23/2015  Overview:  x 3  Last Assessment & Plan:  By [place] has left nephrolithiasis, but not visible by KUB. Will check CT renal colic next year to assess both stone burden as well as to  surveil AML.   Overview:  x 3  Last Assessment & Plan:  Has 63m nonobstructing LUP stone - not visible by KUB.  Will check renal [place]2019 - he will contact office sooner if symptomatic.   Non-sustained ventricular tachycardia ([place] 03/29/2017  Nonsustained ventricular tachycardia ([place] 03/29/2017  Other hyperlipidemia 03/29/2017  Palpitations 10/01/2017  Pleural effusion, bilateral 08/09/2017  Pneumonia due to COVID-19 virus 02/07/2021  Post-nasal drainage 02/25/2016  Last Assessment & Plan:  Trial zyrtec and flonase  Prostate cancer screening 06/20/2017  Last Assessment & Plan:  Recommend continued annual CaP screening until within 10 years of life expectancy. Given good health and fhx of longevity, would anticipate CaP screening to continue until age 89  PSA today and again in one year on day of visit.   Pulmonary arterial hypertension ([place] 02/20/2018  Pulmonary edema   Pulmonary hypertension ([place] 08/09/2017  Pulmonary nodules 06/12/2016  S/P AVR 03/20/2016  S/P AVR (aortic valve replacement) 03/20/2016  Strain of deltoid muscle, initial encounter   Supratherapeutic INR 07/26/2017  Syncope and collapse 02/01/2016  Typical atrial flutter ([place] 02/01/2016 Past Surgical History: Past Surgical History: Procedure Laterality Date  CHOLECYSTECTOMY    CORONARY ARTERY BYPASS GRAFT    EXPLORATORY LAPAROTOMY  07/30/2017  FOOT SURGERY    FRACTURE SURGERY Right   wrist and forearm  HERNIA REPAIR    MECHANICAL AORTIC VALVE REPLACEMENT    NASAL SINUS SURGERY    RIGHT HEART CATH N/A 01/18/2018  Procedure: RIGHT HEART CATH;  Surgeon: BJolaine Artist MD;  [facility];  Service: Cardiovascular;  Laterality: N/A;  RIGHT HEART CATH N/A 05/14/2018  Procedure: RIGHT HEART CATH;  Surgeon: BJolaine Artist MD;  [place];  Service: Cardiovascular;  Laterality: N/A;  RIGHT HEART CATH N/A 09/29/2021  Procedure: RIGHT HEART CATH;  Surgeon: BJolaine Artist MD;  [facility];  Service: Cardiovascular;  Laterality: N/A;  TEE WITHOUT CARDIOVERSION N/A 05/21/2018  Procedure: TRANSESOPHAGEAL ECHOCARDIOGRAM (TEE);  Surgeon: BJolaine Artist MD;  [facility];  Service: Cardiovascular;  Laterality: N/A;  UPPER GASTROINTESTINAL ENDOSCOPY  07/12/2017  Patchy areas of mucosal inflammation noted in the antrum with edema,erthema and ulcerations. Bx. Chronicfocally active gastritis.  VASECTOMY   HPI: [place] 76[place], hyp[place]remia, acute on chronic anemia.  Has history of dysphagia,  recent nausea/vomiting, GERD, anemia, cirrhosis, CHF, COPD,  pulmonary hypertension, coronary artery disease with prior CABG and  mechanical AVR, chronic respiratory failure, aortic stenosis and  atrial fibrillation. History of EGD in 2018 with gastritis. Hgb  stable, pt on coumadin.  Hospital coarse compicated by delirium.  RN noted pt coughing with intake of liquids and SLP swallow eval thus ordered.  Per notes in chart, pt also has FTT.  UGI study 5/10 showed aspiration episode x1 per radiologist note -- SLP reviewed flouro loop and pt also had pharyngeal retention.  Pt reports h/o sensing retention in throat and esophagus and chronic cough since he had COVID this year.   He does endorse improvement with being on reflux medications.  Has had pna twice since COVID per pt.  Eats 2 meals a day.  Denies need for heimlich manuever.  Subjective: pt awake in chair  Recommendations for follow up therapy are one component of a multi-disciplinary discharge planning process, led by the attending physician.  Recommendations may be updated based on patient status, additional functional criteria and insurance authorization. Assessment /  Plan / Recommendation   05/23/2022   3:56 PM Clinical Impressions Clinical Impression Pt given limited barium including thin, nectar and single bolus of puree.  Moderate oral dysphagia characterized by weakness - causing pt to expend significant effort to elicit oral transiting with delay. He tends to dip head forward to help elicit a swallow and admits it requires effort.   Severe pharyngeal dysphagia evidenced by poor pharyngeal motility resulting in retention throughout pharynx with all consistencies that mixed with secretions.  Retention did not clear despite liquid swallow and cued dry swallows *cued dry swallows inconsistently conducted and appeared to fatigue pt.  Cued "hock" was not produced despite demonstration from SLP x2 and thus pt did not clear vallecular retention.  Aspiration of nectar observed due to poor motility and did not clear despite reflexive cough.  HOB reclined and head turn right/left did not prevent or facilitate pharyngeal clearance. Following MBS, pt admitted to chronic problems swallowing.   Pt will aspirate regardless  of consistency he consumes and retention is worse with increased visocity.     Discussed with pt severity of his dysphagia with aspiration (Of even secretions).  He advised that he NOT want to be a full code.        Recommend consider palliative consult to establish [place] given pt's deconditioning and level of dysphagia.  Recommend NPO x ice chips and change medications to IV (that can be changed).  PO Intake for comfort with accepted risks may align with pt's goals.  Prognosis for swallow function to return is guarded given chronicity of and severity of dysphagia. SLP Visit Diagnosis Dysphagia, oropharyngeal phase (R13.12) Impact on safety and function Risk for inadequate nutrition/hydration;Severe aspiration risk     05/23/2022   3:56 PM Treatment Recommendations Treatment Recommendations Therapy as outlined in treatment plan below     05/23/2022   3:58 PM Prognosis Prognosis for Safe Diet Advancement Guarded Barriers to Reach Goals Time post onset;Severity of deficits   05/23/2022   3:56 PM Diet Recommendations SLP Diet Recommendations Ice chips PRN after oral care Medication Administration Via alternative means     05/23/2022   3:56 PM Other Recommendations Oral Care Recommendations Oral care BID Other Recommendations Have oral suction available Follow Up Recommendations Other (comment) Functional Status Assessment Patient has had a recent decline in their functional status and/or demonstrates limited ability to make significant improvements in function in a reasonable and predictable amount of time    View : No data to display.        05/23/2022   3:49 PM Oral Phase Oral Phase Impaired Oral - Nectar Teaspoon Decreased bolus cohesion Oral - Nectar Cup Decreased bolus cohesion;Weak lingual manipulation;Premature spillage;Reduced posterior propulsion Oral - Nectar Straw Decreased bolus cohesion;Weak lingual manipulation;Premature spillage;Reduced posterior propulsion Oral - Thin Teaspoon Decreased bolus cohesion;Weak  lingual manipulation;Premature spillage;Reduced posterior propulsion Oral - Thin Cup Decreased bolus cohesion;Weak lingual manipulation;Premature spillage;Reduced posterior propulsion Oral - Thin Straw Decreased bolus cohesion;Weak lingual manipulation;Premature spillage;Reduced posterior propulsion Oral - Puree Premature spillage;Reduced posterior propulsion    05/23/2022   3:50 PM Pharyngeal Phase Pharyngeal Phase Impaired Pharyngeal- Nectar Teaspoon Reduced pharyngeal peristalsis;Reduced epiglottic inversion;Pharyngeal residue - valleculae;Pharyngeal residue - pyriform;Reduced airway/laryngeal closure;Reduced tongue base retraction;Delayed swallow initiation-vallecula Pharyngeal Material does not enter airway Pharyngeal- Nectar Cup Delayed swallow initiation-vallecula;Reduced epiglottic inversion;Reduced pharyngeal peristalsis;Reduced tongue base retraction;Reduced airway/laryngeal closure;Pharyngeal residue - pyriform;Pharyngeal residue - valleculae;Moderate aspiration;Penetration/Aspiration during swallow Pharyngeal Material enters airway, passes BELOW cords and not ejected out despite cough attempt by patient  Pharyngeal- Nectar Straw Delayed swallow initiation-vallecula;Reduced epiglottic inversion;Reduced anterior laryngeal mobility;Reduced laryngeal elevation;Reduced airway/laryngeal closure;Reduced tongue base retraction;Pharyngeal residue - valleculae;Pharyngeal residue - pyriform Pharyngeal Material does not enter airway Pharyngeal- Thin Teaspoon Pharyngeal residue - pyriform;Reduced pharyngeal peristalsis;Reduced epiglottic inversion;Reduced anterior laryngeal mobility;Reduced laryngeal elevation;Reduced airway/laryngeal closure;Reduced tongue base retraction;Delayed swallow initiation-vallecula;Lateral channel residue Pharyngeal Material does not enter airway Pharyngeal- Thin Cup Delayed swallow initiation-vallecula;Reduced pharyngeal peristalsis;Reduced epiglottic inversion;Reduced anterior laryngeal  mobility;Reduced laryngeal elevation;Reduced airway/laryngeal closure;Reduced tongue base retraction;Pharyngeal residue - valleculae;Pharyngeal residue - pyriform;Lateral channel residue Pharyngeal Material does not enter airway Pharyngeal- Thin Straw Delayed swallow initiation-vallecula;Reduced pharyngeal peristalsis;Reduced epiglottic inversion;Reduced anterior laryngeal mobility;Reduced laryngeal elevation;Reduced tongue base retraction;Lateral channel residue Pharyngeal Material does not enter airway Pharyngeal- Puree Reduced epiglottic inversion;Reduced pharyngeal peristalsis;Reduced tongue base retraction;Pharyngeal residue - valleculae;Pharyngeal residue - pyriform Pharyngeal Material does not enter airway    05/23/2022   3:56 PM Cervical Esophageal Phase  Cervical Esophageal Phase Impaired Kathleen Lime, MS [facility] SLP Acute Rehab Services Office [phone] Pager [phone] Macario Golds 05/23/2022, 4:00 PM                       Medical Consultants:   None.   Subjective:    BRECKON REEVES relates that he really wants to eat this morning , knowing the risk of aspiration  Objective:    Vitals:   05/24/22 1030 05/24/22 2019 05/25/22 0352 05/25/22 0500  BP: (!) 99/58 (!) 103/50 121/75   Pulse: 67 70 88   Resp:  18 16   Temp: 97.8 F (36.6 C) 97.6 F (36.4 C) (!) 97.3 F (36.3 C)   TempSrc: Oral Oral Oral   SpO2: 92% 94% 90%   Weight:    58.7 kg  Height:       SpO2: 90 % O2 Flow Rate (L/min): 2 L/min   Intake/Output Summary (Last 24 hours) at 05/25/2022 0842 Last data filed at 05/24/2022 2159 Gross per 24 hour  Intake 403 ml  Output 300 ml  Net 103 ml    Filed Weights   05/22/22 0436 05/23/22 0500 05/25/22 0500  Weight: 44.4 kg 58.7 kg 58.7 kg    Exam: General exam: In no acute distress. Respiratory system: Good air movement and clear to auscultation. Cardiovascular system: S1 & S2 heard, RRR. No JVD. Gastrointestinal system: Abdomen is nondistended, soft and  nontender.  Extremities: No pedal edema. Skin: No rashes, lesions or ulcers Psychiatry: Judgement and insight appear normal. Mood & affect appropriate.   Data Reviewed:    Labs: Basic Metabolic Panel: Recent Labs  Lab 05/21/22 0309 05/22/22 0300 05/23/22 0233 05/24/22 0256 05/25/22 0542  NA 125* 126* 125* 127* 130*  K 4.3 4.5 4.7 4.6 4.3  CL 93* 94* 93* 94* 98  CO2 _0 GLUCOSE 94 81 71 79 90  BUN 39* 39* 36* 31* 31*  CREATININE 1.61* 1.72* 1.51* 1.29* 1.33*  CALCIUM 9.5 9.6 9.4 9.9 10.2  MG 2.3 2.1 2.0 2.0 2.2  PHOS  --   --  2.6  --   --     GFR Estimated Creatinine Clearance: 39.8 mL/min (A) (by C-G formula based on SCr of 1.33 mg/dL (H)). Liver Function Tests: Recent Labs  Lab 05/21/22 0309  AST 27  ALT 17  ALKPHOS 71  BILITOT 3.2*  PROT 7.1  ALBUMIN 3.2*    No results for input(s): LIPASE, AMYLASE in the last 168 hours. No results for input(s): AMMONIA in the last  168 hours. Coagulation profile Recent Labs  Lab 05/21/22 0309 05/22/22 0300 05/23/22 0233 05/24/22 0256 05/25/22 0542  INR 1.9* 2.3* 3.2* 4.0* 3.3*    COVID-19 Labs  No results for input(s): DDIMER, FERRITIN, LDH, CRP in the last 72 hours.  Lab Results  Component Value Date   SARSCOV2NAA NEGATIVE 02/04/2022   [place] NEGATIVE 01/23/2022   [place] NEGATIVE 09/29/2021   [place] Not Detected 06/17/2021    CBC: Recent Labs  Lab 05/20/22 0248 05/21/22 0309 05/22/22 0300 05/23/22 0233 05/24/22 0256  WBC 3.8* 3.5* 3.7* 3.5* 3.0*  NEUTROABS 3.1 2.7 2.9 2.5 2.0  HGB 8.7* 8.4* 8.6* 8.6* 8.9*  HCT 28.8* 28.0* 27.1* 27.6* 28.7*  MCV 78.3* 79.1* 77.7* 77.5* 78.8*  PLT 111* 108* 117* 110* 128*    Cardiac Enzymes: No results for input(s): CKTOTAL, CKMB, CKMBINDEX, TROPONINI in the last 168 hours. BNP (last 3 results) Recent Labs    07/04/21 1607 08/16/21 1620 02/23/22 1218  PROBNP 425.0* 601.0* 891.0*    CBG: Recent Labs  Lab 05/23/22 2329  05/24/22 0356 05/24/22 0747 05/24/22 1226 05/24/22 1652  GLUCAP 93 79 79 135* 118*    D-Dimer: No results for input(s): DDIMER in the last 72 hours. Hgb A1c: No results for input(s): HGBA1C in the last 72 hours. Lipid Profile: No results for input(s): CHOL, HDL, LDLCALC, TRIG, CHOLHDL, LDLDIRECT in the last 72 hours. Thyroid function studies: No results for input(s): TSH, T4TOTAL, T3FREE, THYROIDAB in the last 72 hours.  Invalid input(s): FREET3 Anemia work up: No results for input(s): VITAMINB12, FOLATE, FERRITIN, TIBC, IRON, RETICCTPCT in the last 72 hours. Sepsis Labs: Recent Labs  Lab 05/21/22 0309 05/22/22 0300 05/23/22 0233 05/24/22 0256  WBC 3.5* 3.7* 3.5* 3.0*    Microbiology Recent Results (from the past 240 hour(s))  MRSA Next Gen by PCR, Nasal     Status: Abnormal   Collection Time: 05/19/22  1:38 AM   Specimen: Nasal Mucosa; Nasal Swab  Result Value Ref Range Status   MRSA by PCR Next Gen DETECTED (A) NOT DETECTED Final    Comment: (NOTE) The GeneXpert MRSA Assay (FDA approved for NASAL specimens only), is one component of a comprehensive MRSA colonization surveillance program. It is not intended to diagnose MRSA infection nor to guide or monitor treatment for MRSA infections. Test performance is not FDA approved in patients less than 31 years old. Performed at [facility], [place] [address] [place], [place] [address]      Medications:    (feeding supplement) PROSource Plus  30 mL Oral Daily   busPIRone  7.5 mg Oral BID   docusate sodium  100 mg Oral BID   famotidine  40 mg Oral QHS   feeding supplement  237 mL Oral BID BM   macitentan  10 mg Oral Daily   mouth rinse  15 mL Mouth Rinse BID   midodrine  5 mg Oral TID WC   multivitamin with minerals  1 tablet Oral Daily   mupirocin ointment  1 application. Nasal BID   pantoprazole (PROTONIX) IV  40 mg Intravenous Q12H   polyethylene glycol  17 g Oral BID   rosuvastatin  5 mg  Oral Daily   Selexipag  200 mcg Oral QHS   And   Selexipag  400 mcg Oral Daily   sodium chloride flush  3 mL Intravenous Q12H   sodium chloride  1 g Oral BID WC   sucralfate  1 g Oral BID   tadalafil  20 mg Oral Daily   Warfarin - Pharmacist Dosing Inpatient   Does not apply q1600   Continuous Infusions:  sodium chloride        LOS: 8 days   Charlynne Cousins  [facility]  05/25/2022, 8:42 AM

## 2022-05-25 NOTE — Progress Notes (Signed)
Daily Progress Note   Patient Name: Lance Hicks       Date: 05/25/2022 DOB: 1946-08-15  Age: 76 y.o. MRN#: [id number] Attending Physician: Charlynne Cousins, MD Primary Care Physician: Elise Benne Admit Date: 05/17/2022  Reason for Consultation/Follow-up: Establishing goals of care  Subjective: I met today with Lance Hicks.  He was awake and alert but alone in his room during my encounter.  We discussed his clinical course and continued concern regarding progressive heart failure as well as dysphagia with risk for aspiration.  He expressed understanding concerns and confirms that he discussed further with Dr. Venetia Constable this morning.  We talked about care plan moving forward and discussed concern regarding if he aspirates further moving forward.  We also discussed again regarding heroic interventions and limitations of care.  He tells me he used to understand CPR and we talked about his understanding of situations where this could be beneficial.  We then discussed his current situation with irreversible medical problems and he expressed understanding that CPR mechanical ventilation in the event of cardiac or respiratory arrest were unlikely to result in him being well enough to return to his home which is his stated goal.  He was in agreement with limitation of care of DNR/DNI.  He asked me to call and discuss further with his Neill Loft.  I called and spoke with Lance Hicks, who is Lance Hicks's neighbor.  Lance Hicks reports that he tries to help him out as much as possible but he also has a farm to take care of as well as a 75 year old daughter and wife.  He says that we will certainly do anything they can his neighbors to support Lance Hicks, but he does not foresee there being the  possibility of having 24-hour in-home care for him when he leaves the hospital.  I reviewed his clinical course with Lance Hicks and we discussed Mr. Paino's expressing agreement with plan for DNR/DNI in the event of cardiac or respiratory arrest.  He expressed understanding.  Length of Stay: 8  Current Medications: Scheduled Meds:   (feeding supplement) PROSource Plus  30 mL Oral Daily   busPIRone  7.5 mg Oral BID   docusate sodium  100 mg Oral BID   famotidine  40 mg Oral QHS   feeding supplement  237 mL Oral BID  BM   macitentan  10 mg Oral Daily   mouth rinse  15 mL Mouth Rinse BID   metoCLOPramide (REGLAN) injection  5 mg Intravenous Q6H   midodrine  5 mg Oral TID WC   multivitamin with minerals  1 tablet Oral Daily   mupirocin ointment  1 application. Nasal BID   pantoprazole (PROTONIX) IV  40 mg Intravenous Q12H   polyethylene glycol  17 g Oral BID   rosuvastatin  5 mg Oral Daily   Selexipag  200 mcg Oral QHS   And   Selexipag  400 mcg Oral Daily   sodium chloride flush  3 mL Intravenous Q12H   sodium chloride  1 g Oral BID WC   sucralfate  1 g Oral BID   tadalafil  20 mg Oral Daily   Warfarin - Pharmacist Dosing Inpatient   Does not apply q1600    Continuous Infusions:  sodium chloride 75 mL/hr at 05/25/22 1245   ondansetron (ZOFRAN) IV      PRN Meds: acetaminophen **OR** acetaminophen, hydrALAZINE, ipratropium-albuterol, metoprolol tartrate, naphazoline-pheniramine, ondansetron **OR** ondansetron (ZOFRAN) IV, senna-docusate, sodium chloride, traZODone  Physical Exam      General: Alert, awake, in no acute distress.  Frail HEENT: No bruits, no goiter, no JVD Heart: Regular rate and rhythm. No murmur appreciated. Lungs: Good air movement, clear Abdomen: Soft, nontender, nondistended, positive bowel sounds.   Ext: No significant edema Skin: Warm and dry Neuro: Grossly intact, nonfocal.     Vital Signs: BP 115/73 (BP Location: Left Arm)   Pulse 83   Temp 97.8 F  (36.6 C) (Oral)   Resp (!) 24   Ht _0  (1.753 m)   Wt 58.7 kg   SpO2 92%   BMI 19.11 kg/m  SpO2: SpO2: 92 % O2 Device: O2 Device: Room Air O2 Flow Rate: O2 Flow Rate (L/min): 2 L/min  Intake/output summary:  Intake/Output Summary (Last 24 hours) at 05/25/2022 1854 Last data filed at 05/25/2022 1700 Gross per 24 hour  Intake 610.34 ml  Output 400 ml  Net 210.34 ml   LBM: Last BM Date : 05/24/22 Baseline Weight: Weight: 52 kg Most recent weight: Weight: 58.7 kg       Palliative Assessment/Data:    Flowsheet Rows    Flowsheet Row Most Recent Value  Intake Tab   Referral Department Hospitalist  Unit at Time of Referral ICU  Palliative Care Primary Diagnosis Cardiac  Date Notified 05/23/22  Palliative Care Type Return patient Palliative Care  Reason for referral Clarify Goals of Care  Date of Admission 05/17/22  Date first seen by Palliative Care 05/24/22  # of days Palliative referral response time 1 Day(s)  # of days IP prior to Palliative referral 6  Clinical Assessment   Palliative Performance Scale Score 40%  Psychosocial & Spiritual Assessment   Palliative Care Outcomes   Patient/Family meeting held? Yes  Who was at the meeting? Patient       Patient Active Problem List   Diagnosis Date Noted   Acute renal failure superimposed on stage 3a chronic kidney disease ([place]) 05/17/2022   Hyponatremia 03/30/2022   Acute on chronic anemia 07/01/2019   Coronary artery disease involving native coronary artery of native heart with angina pectoris ([place]) 03/29/2017   Chronic combined systolic and diastolic CHF (congestive heart failure) ([place]) 11/03/2021   [name] 03/20/2016   Pulmonary arterial hypertension ([place]) 02/20/2018   Hypotension, chronic 06/06/2018   Chronic atrial fibrillation ([place])  07/23/2015   COPD (chronic obstructive pulmonary disease) ([place]) 06/12/2016   Heme positive stool    FTT (failure to thrive) in adult    Chronic  anticoagulation    Gastritis    Diverticulosis 03/30/2022   Mass of lung 03/30/2022   Medication adverse effect 03/30/2022   Abdominal pain 01/31/2022   Protein-calorie malnutrition, severe ([place]) 01/28/2022   Physical deconditioning 01/25/2022   GERD (gastroesophageal reflux disease) 01/25/2022   Iron deficiency anemia 01/25/2022   Alcohol abuse 01/25/2022   Acute kidney injury superimposed on chronic kidney disease ([place]) 74/25/9563   Acute systolic CHF (congestive heart failure) ([place]) 01/23/2022   ILD (interstitial lung disease) ([place]) 08/04/2021   Fall 06/21/2021   Asthma    Pneumonia due to COVID-19 virus 02/07/2021   Cirrhosis of liver not due to alcohol ([place]) 07/01/2019   Enterococcal bacteremia    Pulmonary edema    Strain of deltoid muscle, initial encounter    Hypokalemia due to excessive renal loss of potassium 02/18/2018   Left ureteral stone 01/23/2018   Anticoagulated on Coumadin 01/04/2018   Localized edema 01/04/2018   Leukocytoclastic vasculitis ([place]) 10/01/2017   Hypersensitivity angiitis ([place]) 10/01/2017   Maculopapular rash 09/03/2017   Epidermoid cyst of skin 08/24/2017   Bilateral pleural effusion 08/09/2017   Pleural effusion, bilateral 08/09/2017   Supratherapeutic INR 07/26/2017   International normalized ratio (INR) raised 07/26/2017   Microscopic hematuria 06/20/2017   Asymptomatic microscopic hematuria 06/20/2017   Atrial flutter ([place]) 03/29/2017   H/O maze procedure 03/29/2017   History of coronary artery bypass graft 03/29/2017   Hypertensive heart disease with heart failure ([place]) 03/29/2017   Hyperlipidemia 03/29/2017   Coronary arteriosclerosis in native artery 03/29/2017   Long term (current) use of anticoagulants 03/29/2017   Non-sustained ventricular tachycardia ([place]) 03/29/2017   Cough 10/17/2016   Pulmonary nodules 06/12/2016   Multiple nodules of lung 06/12/2016   Anxiety 05/10/2016   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 male  with past medical history of chronic combined systolic and diastolic heart failure with a EF of 45%, CAD status post CABG, chronic A-fib, aortic stenosis status post mechanical valve replacement, pulmonary hypertension, chronic kidney disease, cirrhosis, anemia admitted on 05/17/2022 with renal failure, hyponatremia and combined heart failure.  Further work-up has revealed concern for aspiration.  Palliative consulted for goals of care.  Recommendations/Plan: DNR/DNI Lance Hicks continues to minimize concerns regarding his long-term prognosis and chronic conditions including progressive heart failure.  At the same time, he is more open to conversation regarding concerns about dysphagia and states that he wants to go to have intake despite risk.  As noted, we discussed limitations of care and he was in agreement with no attempts at resuscitation in the event of a natural death. He states today that he is planning on going home, however, there is great concern about his ability to care for himself and his neighbor/HCPOA reports they will certainly do what they can to support him but this would not be anything close to providing 24-hour in him care for him.  Goals  of Care and Additional Recommendations: Limitations on Scope of Treatment: Full Scope Treatment  Code Status:    Code Status Orders  (From admission, onward)           Start     Ordered   05/25/22 1453  Do not attempt resuscitation (DNR)  Continuous       Question Answer Comment  In the event of  cardiac or respiratory ARREST Do not call a "code blue"   In the event of cardiac or respiratory ARREST Do not perform Intubation, CPR, defibrillation or ACLS   In the event of cardiac or respiratory ARREST Use medication by any route, position, wound care, and other measures to relive pain and suffering. May use oxygen, suction and manual treatment of airway obstruction as needed for comfort.      05/25/22 1453           Code Status History     Date Active Date Inactive Code Status Order ID Comments User Context   05/17/2022 1933 05/25/2022 1453 Full Code [id number]  Lenore Cordia, MD Inpatient   01/25/2022 1641 02/05/2022 1922 DNR [id number]  Lin Landsman, NP Inpatient   01/23/2022 1818 01/25/2022 1641 Full Code [id number]  Lequita Halt, MD ED   11/03/2021 1718 11/09/2021 1727 Full Code [id number]  Consuelo Pandy, PA-C Inpatient   09/29/2021 1430 10/04/2021 2049 Full Code [id number]  Jolaine Artist, MD Inpatient   05/14/2018 1145 05/23/2018 1709 Full Code [id number]  Hamdan, Toscano Inpatient   01/18/2018 1104 01/18/2018 1529 Full Code [id number]  Bensimhon, Shaune Pascal, MD Inpatient      Advance Directive Documentation    Flowsheet Row Most Recent Value  Type of Advance Directive Healthcare Power of Attorney  Pre-existing out of facility DNR order (yellow form or pink MOST form) --  "MOST" Form in Place? --       Prognosis: Guarded  Discharge Planning: To be determined  Care plan was discussed with patient, HCPOA, bedside RN, speech therapy  Thank you for allowing the Palliative Medicine Team to assist in the care of this patient.   Time In: 1440 Time Out: 1540 Total Time 60 Prolonged Time Billed  no     Micheline Rough, MD  Please contact Palliative Medicine Team phone at [phone] for questions and concerns.

## 2022-05-25 NOTE — Progress Notes (Signed)
[place] for Warfarin Indication: Mechanical AVR and Afib  Allergies  Allergen Reactions   Augmentin [Amoxicillin-Pot Clavulanate] Anaphylaxis and Diarrhea    "Upset stomach"   Amoxicillin     Other reaction(s): Nausea/Vomiting   Chicken Allergy Other (See Comments)    Other reaction(s): Unknown   Clavulanic Acid     Other reaction(s): Nausea/Vomiting   Pantoprazole Sodium Nausea Only    Gets gassy and starting itching like crazy Gets gassy and starting itching like crazy   Tizanidine     Other reaction(s): See Comments   Valium [Diazepam] Other (See Comments)    HA and Abd pain.   Tape Rash and Other (See Comments)    Surgical tape   Wound Dressing Adhesive Other (See Comments) and Rash    Surgical tape Surgical tape Surgical tape    Patient Measurements: Height: _0  (175.3 cm) Weight: 58.7 kg (129 lb 6.4 oz) IBW/kg (Calculated) : 70.7  Vital Signs: Temp: 97.3 F (36.3 C) (06/01 0352) Temp Source: Oral (06/01 0352) BP: 121/75 (06/01 0352) Pulse Rate: 88 (06/01 0352)  Labs: Recent Labs    05/23/22 0233 05/24/22 0256 05/25/22 0542  HGB 8.6* 8.9*  --   HCT 27.6* 28.7*  --   PLT 110* 128*  --   LABPROT 32.4* 38.5* 33.1*  INR 3.2* 4.0* 3.3*  CREATININE 1.51* 1.29* 1.33*     Estimated Creatinine Clearance: 39.8 mL/min (A) (by C-G formula based on SCr of 1.33 mg/dL (H)).  Information Confirmed with [facility] note 04/11/22: -PTA warfarin dose: 2.5 mg daily except 5 mg Mon/Wed/Sat  -INR goal: 2.5 - 3  Last dose taken PTA was on 5/22  Assessment: 76 yo male with hx mechanical aortic valve and afib on chronic warfarin admitted with acute on chronic anemia and melena. Warfarin was initially held on admission and GI consulted. Pharmacy consulted to resume warfarin on 5/27 as no invasive procedures planned, Hgb stable, and patient at high risk for thrombosis due to mechanical valve.   Today, 05/25/2022 INR = 3.3 is slightly  above patients goal but decreased after holding yesterday CBC: Last labs on 5/31 Hgb low/stable at 8.9, Plts 128 (low but improved from 80-90s on admit) Hepatic: AST/ALT WNL, Tbili elevated 3.2 (last on 5/28). Albumin 3.2 SCr 1.33 - renal function at baseline Diet: Advanced to CLD this morning. Pt did not eat much breakfast. Was NPO except for ice chips, sips w/meds yesterday due to concern for aspiration Ordered Ensure and Prosource which have been charted Drug interactions: No new DDI, no major DDI Some bleeding reported around gums/in mouth. No other signs of bleeding.   INR has been labile on lower dose than prescribed PTA - reduced PO intake likely contributing.   Goal of Therapy:  INR 2.5-3 per outpatient clinic notes   Plan:  Will give reduced dose of Warfarin 1 mg PO today INR daily. Recheck CBC with AM labs tomorrow Monitor for signs of bleeding Due to mechanical valve, patient would need to be bridged if INR subtherapeutic  Lenis Noon, PharmD 05/25/22 8:02 AM

## 2022-05-25 NOTE — Care Management Important Message (Signed)
Important Message  Patient Details IM Letter placed in Patients room. Name: AMEN STASZAK MRN: [id number] Date of Birth: [birth year]   Medicare Important Message Given:  Yes     Kerin Salen 05/25/2022, 9:49 AM

## 2022-05-26 LAB — GLUCOSE, CAPILLARY
Glucose-Capillary: 117 mg/dL — ABNORMAL HIGH (ref 70–99)
Glucose-Capillary: 124 mg/dL — ABNORMAL HIGH (ref 70–99)
Glucose-Capillary: 134 mg/dL — ABNORMAL HIGH (ref 70–99)
Glucose-Capillary: 135 mg/dL — ABNORMAL HIGH (ref 70–99)
Glucose-Capillary: 137 mg/dL — ABNORMAL HIGH (ref 70–99)
Glucose-Capillary: 150 mg/dL — ABNORMAL HIGH (ref 70–99)

## 2022-05-26 LAB — CBC
HCT: 30.8 % — ABNORMAL LOW (ref 39.0–52.0)
Hemoglobin: 9.3 g/dL — ABNORMAL LOW (ref 13.0–17.0)
MCH: 24.2 pg — ABNORMAL LOW (ref 26.0–34.0)
MCHC: 30.2 g/dL (ref 30.0–36.0)
MCV: 80 fL (ref 80.0–100.0)
Platelets: 129 10*3/uL — ABNORMAL LOW (ref 150–400)
RBC: 3.85 MIL/uL — ABNORMAL LOW (ref 4.22–5.81)
RDW: 23.4 % — ABNORMAL HIGH (ref 11.5–15.5)
WBC: 3.4 10*3/uL — ABNORMAL LOW (ref 4.0–10.5)
nRBC: 0 % (ref 0.0–0.2)

## 2022-05-26 LAB — BASIC METABOLIC PANEL
Anion gap: 5 (ref 5–15)
BUN: 27 mg/dL — ABNORMAL HIGH (ref 8–23)
CO2: 25 mmol/L (ref 22–32)
Calcium: 9.6 mg/dL (ref 8.9–10.3)
Chloride: 103 mmol/L (ref 98–111)
Creatinine, Ser: 1.17 mg/dL (ref 0.61–1.24)
GFR, Estimated: >60 mL/min (ref >60)
Glucose, Bld: 146 mg/dL — ABNORMAL HIGH (ref 70–99)
Potassium: 4.2 mmol/L (ref 3.5–5.1)
Sodium: 133 mmol/L — ABNORMAL LOW (ref 135–145)

## 2022-05-26 LAB — PROTIME-INR
INR: 2.9 — ABNORMAL HIGH (ref 0.8–1.2)
Prothrombin Time: 29.9 seconds — ABNORMAL HIGH (ref 11.4–15.2)

## 2022-05-26 MED ORDER — TORSEMIDE 20 MG PO TABS
40.0000 mg | ORAL_TABLET | Freq: Every day | ORAL | Status: DC
  Administered 2022-05-26 – 2022-05-27 (×2): 40 mg via ORAL
  Filled 2022-05-26 (×2): qty 2

## 2022-05-26 MED ORDER — TORSEMIDE 20 MG PO TABS
60.0000 mg | ORAL_TABLET | Freq: Every day | ORAL | Status: DC
  Administered 2022-05-26 – 2022-05-28 (×3): 60 mg via ORAL
  Filled 2022-05-26 (×3): qty 3

## 2022-05-26 MED ORDER — ERYTHROMYCIN 5 MG/GM OP OINT
TOPICAL_OINTMENT | Freq: Four times a day (QID) | OPHTHALMIC | Status: DC
  Administered 2022-05-26 – 2022-05-28 (×3): 1 via OPHTHALMIC
  Filled 2022-05-26: qty 1
  Filled 2022-05-26: qty 3.5

## 2022-05-26 MED ORDER — PANTOPRAZOLE SODIUM 40 MG PO TBEC
40.0000 mg | DELAYED_RELEASE_TABLET | Freq: Every day | ORAL | Status: DC
  Administered 2022-05-27 – 2022-05-28 (×2): 40 mg via ORAL
  Filled 2022-05-26 (×4): qty 1

## 2022-05-26 MED ORDER — ASPIRIN 81 MG PO TBEC: 81.0000 mg | DELAYED_RELEASE_TABLET | Freq: Every day | ORAL | Status: DC

## 2022-05-26 MED ORDER — SPIRONOLACTONE 25 MG PO TABS
25.0000 mg | ORAL_TABLET | Freq: Every day | ORAL | Status: DC
  Administered 2022-05-26 – 2022-05-28 (×3): 25 mg via ORAL
  Filled 2022-05-26 (×4): qty 1

## 2022-05-26 MED ORDER — TORSEMIDE 20 MG PO TABS: 40.0000 mg | ORAL_TABLET | Freq: Every day | ORAL | Status: DC

## 2022-05-26 MED ORDER — WARFARIN SODIUM 2.5 MG PO TABS
2.5000 mg | ORAL_TABLET | Freq: Once | ORAL | Status: AC
  Administered 2022-05-26: 2.5 mg via ORAL
  Filled 2022-05-26: qty 1

## 2022-05-26 MED ORDER — TIZANIDINE HCL 4 MG PO TABS: 2.0000 mg | ORAL_TABLET | Freq: Three times a day (TID) | ORAL | Status: DC | PRN

## 2022-05-26 NOTE — Progress Notes (Signed)
[place] for Warfarin Indication: Mechanical AVR and Afib  Allergies  Allergen Reactions   Augmentin [Amoxicillin-Pot Clavulanate] Anaphylaxis and Diarrhea    "Upset stomach"   Amoxicillin     Other reaction(s): Nausea/Vomiting   Chicken Allergy Other (See Comments)    Other reaction(s): Unknown   Clavulanic Acid     Other reaction(s): Nausea/Vomiting   Pantoprazole Sodium Nausea Only    Gets gassy and starting itching like crazy Gets gassy and starting itching like crazy   Tizanidine     Other reaction(s): See Comments   Valium [Diazepam] Other (See Comments)    HA and Abd pain.   Tape Rash and Other (See Comments)    Surgical tape   Wound Dressing Adhesive Other (See Comments) and Rash    Surgical tape Surgical tape Surgical tape    Patient Measurements: Height: _0  (175.3 cm) Weight: 58.6 kg (129 lb 1.6 oz) IBW/kg (Calculated) : 70.7  Vital Signs: Temp: 98 F (36.7 C) (06/02 0600) Temp Source: Oral (06/02 0600) BP: 97/65 (06/02 0600) Pulse Rate: 79 (06/02 0600)  Labs: Recent Labs    05/24/22 0256 05/25/22 0542 05/26/22 0505  HGB 8.9*  --  9.3*  HCT 28.7*  --  30.8*  PLT 128*  --  129*  LABPROT 38.5* 33.1* 29.9*  INR 4.0* 3.3* 2.9*  CREATININE 1.29* 1.33* 1.17    Estimated Creatinine Clearance: 45.2 mL/min (by C-G formula based on SCr of 1.17 mg/dL).  Information Confirmed with [facility] Clinic note 04/11/22: -PTA warfarin dose: 2.5 mg daily except 5 mg Mon/Wed/Sat  -INR goal: 2.5 - 3  Last dose taken PTA was on 5/22  Assessment: 76 yo male with hx mechanical aortic valve and afib on chronic warfarin admitted with acute on chronic anemia and melena. Warfarin was initially held on admission and GI consulted. Pharmacy consulted to resume warfarin on 5/27 as no invasive procedures planned, Hgb stable, and patient at high risk for thrombosis due to mechanical valve.   Today, 05/26/2022 INR = 2.9 and within goal  Hgb/plt  low but stable. No overt bleeding noted High aspiration risk, patient aware. Diet advanced to dysphagia 2  INR has been labile on lower dose than prescribed PTA - reduced PO intake likely contributing.   Goal of Therapy:  INR 2.5-3 per outpatient clinic notes   Plan:  Warfarin 2.26m po x 1 Continue daily INR and CBC Monitor for signs of bleeding, and intake tolerance Due to mechanical valve, patient would need to be bridged if INR subtherapeutic  ELorenso Courier PharmD Clinical Pharmacist 05/26/2022 7:31 AM

## 2022-05-26 NOTE — TOC Progression Note (Signed)
Transition of Care [facility]) - Progression Note    Patient Details  Name: Lance Hicks MRN: [id number] Date of Birth: February 15, 1946  Transition of [facility]) CM/SW [place], LCSW Phone Number: 05/26/2022, 12:50 PM  Clinical Narrative:    Met with pt who is now agreeable to SNF placement. Pt's POA Neoma Laming "Dawn" Wynetta Emery was present during conversation. Pt has been to [facility] in [place] in the past and had a poor experience at their facility. Pt/POA's top choice for SNF placement is [facility] in [place] however, are willing to explore other SNF options if no beds available at [facility] facility. Pt has been referred out for SNF placement. CSW will continue to follow for discharge planning needs.    Expected Discharge Plan: Home/Self Care Barriers to Discharge: Continued Medical Work up  Expected Discharge Plan and Services Expected Discharge Plan: Home/Self Care   Discharge Planning Services: CM Consult   Living arrangements for the past 2 months: Single Family Home                                       Social Determinants of Health (SDOH) Interventions    Readmission Risk Interventions    05/22/2022   10:34 AM  Readmission Risk Prevention Plan  Medication Review (RN Care Manager) Complete  HRI or [place] Complete  SW Recovery Care/Counseling Consult Complete  [place] Not Applicable

## 2022-05-26 NOTE — Progress Notes (Signed)
Physical Therapy Treatment Patient Details Name: Lance Hicks SHELLHAMMER MRN: [id number] DOB: 1946-04-17 Today's Date: 05/26/2022   History of Present Illness 76 y.o. male with medical history significant for chronic combined systolic and diastolic CHF (EF 00-18 percent, G2DD), CAD s/p CABG, chronic atrial fibrillation on Coumadin, aortic stenosis s/p mechanical aortic valve replacement, pulmonary arterial hypertension, COPD, CKD stage IIIa, chronic hypotension on midodrine, hepatic cirrhosis, anemia of chronic disease and iron deficiency, Admitted 05/17/22 from [facility]  for evaluation of generalized weakness associated with acute on chronic anemia.    PT Comments    Pt is slowly progressing toward acute PT goals. Continues to require +2 assist for sit to stand transfers, able to progress to MIN guard for limited distance ambulation of 17f, limited by fatigue. Discussed with pt on benefits of short term rehab stay prior to going home as pt requires increased assist for all mobility and ADLs compared to baseline.  Pt will benefit from continued skilled PT to increase their independence and maximize safety with mobility.     Recommendations for follow up therapy are one component of a multi-disciplinary discharge planning process, led by the attending physician.  Recommendations may be updated based on patient status, additional functional criteria and insurance authorization.  Follow Up Recommendations  Skilled nursing-short term rehab (<3 hours/day)     Assistance Recommended at Discharge Frequent or constant Supervision/Assistance  Patient can return home with the following A little help with bathing/dressing/bathroom;A lot of help with bathing/dressing/bathroom;Assist for transportation;Help with stairs or ramp for entrance;A lot of help with walking and/or transfers   Equipment Recommendations  None recommended by PT    Recommendations for Other Services       Precautions /  Restrictions Precautions Precautions: Fall Precaution Comments: thrombocytopenia, cachectic, bruising easily Restrictions Weight Bearing Restrictions: No     Mobility  Bed Mobility Overal bed mobility: Needs Assistance Bed Mobility: Sit to Supine       Sit to supine: Min assist, HOB elevated   General bed mobility comments: Assist for progression of LEs onto bed    Transfers Overall transfer level: Needs assistance Equipment used: Rolling walker (2 wheels) Transfers: Sit to/from Stand Sit to Stand: Mod assist, +2 physical assistance, +2 safety/equipment           General transfer comment: STS x2; MOD A+2 to stand, cues for scooting hips to edge of seat surface and anterior weight shift, cues for hand placement.    Ambulation/Gait Ambulation/Gait assistance: Min guard Gait Distance (Feet): 50 Feet Assistive device: Rolling walker (2 wheels) Gait Pattern/deviations: Decreased stride length, Step-to pattern Gait velocity: decr     General Gait Details: increased time to negotiate obstacles with use of RW, increased fatigue. cues to maintain close proximity to RW, forward flexed trunk cues for upright.   Stairs             Wheelchair Mobility    Modified Rankin (Stroke Patients Only)       Balance Overall balance assessment: Needs assistance Sitting-balance support: Bilateral upper extremity supported, Feet unsupported Sitting balance-Leahy Scale: Fair     Standing balance support: Bilateral upper extremity supported, During functional activity Standing balance-Leahy Scale: Fair                              Cognition Arousal/Alertness: Awake/alert Behavior During Therapy: WFL for tasks assessed/performed Overall Cognitive Status: Impaired/Different from baseline  Exercises      General Comments        Pertinent Vitals/Pain Pain Assessment Pain Assessment: No/denies pain     Home Living                          Prior Function            PT Goals (current goals can now be found in the care plan section) Acute Rehab PT Goals Patient Stated Goal: agreed to sitting on bed edge PT Goal Formulation: With patient Time For Goal Achievement: 06/06/22 Potential to Achieve Goals: Fair Progress towards PT goals: Progressing toward goals    Frequency    Min 2X/week      PT Plan Current plan remains appropriate    Co-evaluation              AM-PAC PT "6 Clicks" Mobility   Outcome Measure  Help needed turning from your back to your side while in a flat bed without using bedrails?: A Little Help needed moving from lying on your back to sitting on the side of a flat bed without using bedrails?: A Little Help needed moving to and from a bed to a chair (including a wheelchair)?: Total Help needed standing up from a chair using your arms (e.g., wheelchair or bedside chair)?: Total Help needed to walk in hospital room?: A Little Help needed climbing 3-5 steps with a railing? : Total 6 Click Score: 12    End of Session Equipment Utilized During Treatment: Gait belt Activity Tolerance: Patient tolerated treatment well Patient left: in bed;with call bell/phone within reach;with bed alarm set Nurse Communication: Mobility status PT Visit Diagnosis: Unsteadiness on feet (R26.81);Difficulty in walking, not elsewhere classified (R26.2);Adult, failure to thrive (R62.7)     Time: 4099-2780 PT Time Calculation (min) (ACUTE ONLY): 15 min  Charges:  $Therapeutic Activity: 8-22 mins                     Festus Barren PT, DPT  Acute Rehabilitation Services  Office [phone]  05/26/2022, 12:22 PM

## 2022-05-26 NOTE — Progress Notes (Signed)
[facility] PROGRESS NOTE    Progress Note  Lance Hicks  JSH:[id number] DOB: Dec 15, 1946 DOA: 05/17/2022 PCP: [name], PA-C     Brief Narrative:   Lance Hicks is an 76 y.o. male past medical history significant for chronic combined systolic and diastolic heart failure with an EF of 45%, CAD status post CABG, chronic atrial fibrillation and aortic stenosis status post mechanical aortic valve replacement on Coumadin, pulmonary hypertension, chronic kidney disease stage III yea, hepatic cirrhosis, anemia of chronic disease admitted to [facility] for acute kidney injury and hyponatremia, was seen by physician on May 2023 obtain blood work that showed a hemoglobin of 9 Hemoccult positive, transferred to [place] seen by GI [facility], Coumadin was initially held which was eventually restarted as no procedure was plan.  His renal function improved and is now close to baseline.  He is at moderate risk for aspiration and a barium swallow was recommended that showed severe pharyngeal dysphagia and poor pharyngeal mobility.  Patient will aspirate regardless of what he consumes.  Speech recommended n.p.o. except for ice chips.  His home dose of spironolactone and torsemide were held on admission due to acute kidney injury and soft blood pressure   Assessment/Plan:   Acute renal failure superimposed on stage 3a chronic kidney disease ([place]) Baseline creatinine around 1. Appears euvolemic. His creatinine this morning is 1.2.   We will resume diuretics now that he is able to hydrate himself orally. Physical therapy evaluated the patient, he will need to go to skilled nursing facility. TOC has been consulted for skilled I had a long discussion with the patient he is willing to go to skilled nursing facility temporarily.  At high risk for aspiration: Despite explaining the risk and benefits to the patient he would like to proceed to and be full code. He would like his diet  advanced, despite knowing the risk I think it is reasonable. Kidney a dysphagia 2 diet.  Chronic hyponatremia He wants to be on a diet placed on a dysphagia 2 diet. See above for further details regarding his diet.  Acute on chronic anemia Baseline  Like around 11 denies any bleeding, hemoglobin of admission 8.9 FOBT positive GI was consulted who deemed him high risk for endoscopic procedure. Due to cardiopulmonary disease they recommended no further intervention. He was given IV iron. Hemoglobin has remained relatively stable. Recheck hemoglobin tomorrow morning.  Coronary artery disease involving native coronary artery of native heart with angina pectoris ([place]) Denies any chest pain continue statins and Coumadin.  Chronic combined systolic and diastolic CHF (congestive heart failure) ([place]) With an EF of 45%.  He is not on a beta-blocker due to his history of bradycardia and pauses, not a candidate for ACE inhibitor or ARB due to chronic renal disease. Torsemide and Aldactone were held due to his acute kidney injury, his creatinine has improved. He is currently on midodrine for support. Started on gentle fluids as above. Now positive by 1.5 L.  History of mechanical aortic valve: Coumadin was initially held now has been resumed, this morning is 4.0 we will have to hold Coumadin for today.  Pulmonary arterial hypertension: Continue home selexipag, macitentan, tadalafil.  Chronic hypotension: Continue midodrine.  Chronic atrial fibrillation: Rate control now on Coumadin INR is elevated see above. Not on beta-blocker due to his history of bradycardia  Severe protein caloric malnutrition Estimated body mass index is 19.06 kg/m as calculated from the following:   Height as of this encounter:  _0  (1.753 m).   Weight as of this encounter: 58.6 kg. Malnutrition Type:  Nutrition Problem: Severe Malnutrition Etiology: chronic illness   Malnutrition  Characteristics:  Signs/Symptoms: moderate muscle depletion, severe fat depletion, severe muscle depletion   Nutrition Interventions:  Interventions: Ensure Enlive (each supplement provides 350kcal and 20 grams of protein), MVI    DVT prophylaxis: Coumadin Family Communication:none Status is: Inpatient Remains inpatient appropriate because: Aspiration, acute kidney injury and worsening hyponatremia    Code Status:     Code Status Orders  (From admission, onward)           Start     Ordered   05/17/22 1932  Full code  Continuous        05/17/22 1933           Code Status History     Date Active Date Inactive Code Status Order ID Comments User Context   01/25/2022 1641 02/05/2022 1922 DNR [id number]  Lin Landsman, NP Inpatient   01/23/2022 1818 01/25/2022 1641 Full Code [id number]  Lequita Halt, MD ED   11/03/2021 1718 11/09/2021 1727 Full Code [id number]  Consuelo Pandy, PA-C Inpatient   09/29/2021 1430 10/04/2021 2049 Full Code [id number]  Jolaine Artist, MD Inpatient   05/14/2018 1145 05/23/2018 1709 Full Code [id number]  Godric, Lavell, PA-C Inpatient   01/18/2018 1104 01/18/2018 1529 Full Code [id number]  Bensimhon, Shaune Pascal, MD Inpatient      Advance Directive Documentation    Flowsheet Row Most Recent Value  Type of Advance Directive Healthcare Power of Attorney  Pre-existing out of facility DNR order (yellow form or pink MOST form) --  "MOST" Form in Place? --         IV Access:   Peripheral IV   Procedures and diagnostic studies:   No results found.   Medical Consultants:   None.   Subjective:    Lance Hicks willing to go to rehab.  Objective:    Vitals:   05/25/22 2123 05/26/22 0415 05/26/22 0600 05/26/22 1033  BP: 106/70  97/65   Pulse: 79  79   Resp: (!) 22  16   Temp: 98.4 F (36.9 C)  98 F (36.7 C)   TempSrc: Oral  Oral   SpO2: 92%  99% 93%  Weight:  58.6 kg    Height:       SpO2: 93 % O2 Flow  Rate (L/min): 2 L/min   Intake/Output Summary (Last 24 hours) at 05/26/2022 1050 Last data filed at 05/26/2022 0830 Gross per 24 hour  Intake 1287.34 ml  Output 750 ml  Net 537.34 ml    Filed Weights   05/23/22 0500 05/25/22 0500 05/26/22 0415  Weight: 58.7 kg 58.7 kg 58.6 kg    Exam: General exam: In no acute distress. Respiratory system: Good air movement and clear to auscultation. Cardiovascular system: S1 & S2 heard, RRR. No JVD. Gastrointestinal system: Abdomen is nondistended, soft and nontender.  Extremities: No pedal edema. Skin: No rashes, lesions or ulcers Psychiatry: Judgement and insight appear normal. Mood & affect appropriate.   Data Reviewed:    Labs: Basic Metabolic Panel: Recent Labs  Lab 05/21/22 0309 05/22/22 0300 05/23/22 0233 05/24/22 0256 05/25/22 0542 05/26/22 0505  NA 125* 126* 125* 127* 130* 133*  K 4.3 4.5 4.7 4.6 4.3 4.2  CL 93* 94* 93* 94* 98 103  CO2 _1 GLUCOSE 94 81 71 79 90 146*  BUN  39* 39* 36* 31* 31* 27*  CREATININE 1.61* 1.72* 1.51* 1.29* 1.33* 1.17  CALCIUM 9.5 9.6 9.4 9.9 10.2 9.6  MG 2.3 2.1 2.0 2.0 2.2  --   PHOS  --   --  2.6  --   --   --     GFR Estimated Creatinine Clearance: 45.2 mL/min (by C-G formula based on SCr of 1.17 mg/dL). Liver Function Tests: Recent Labs  Lab 05/21/22 0309  AST 27  ALT 17  ALKPHOS 71  BILITOT 3.2*  PROT 7.1  ALBUMIN 3.2*    No results for input(s): LIPASE, AMYLASE in the last 168 hours. No results for input(s): AMMONIA in the last 168 hours. Coagulation profile Recent Labs  Lab 05/22/22 0300 05/23/22 0233 05/24/22 0256 05/25/22 0542 05/26/22 0505  INR 2.3* 3.2* 4.0* 3.3* 2.9*    COVID-19 Labs  No results for input(s): DDIMER, FERRITIN, LDH, CRP in the last 72 hours.  Lab Results  Component Value Date   SARSCOV2NAA NEGATIVE 02/04/2022   [place] NEGATIVE 01/23/2022   [place] NEGATIVE 09/29/2021   [place] Not Detected 06/17/2021     CBC: Recent Labs  Lab 05/20/22 0248 05/21/22 0309 05/22/22 0300 05/23/22 0233 05/24/22 0256 05/26/22 0505  WBC 3.8* 3.5* 3.7* 3.5* 3.0* 3.4*  NEUTROABS 3.1 2.7 2.9 2.5 2.0  --   HGB 8.7* 8.4* 8.6* 8.6* 8.9* 9.3*  HCT 28.8* 28.0* 27.1* 27.6* 28.7* 30.8*  MCV 78.3* 79.1* 77.7* 77.5* 78.8* 80.0  PLT 111* 108* 117* 110* 128* 129*    Cardiac Enzymes: No results for input(s): CKTOTAL, CKMB, CKMBINDEX, TROPONINI in the last 168 hours. BNP (last 3 results) Recent Labs    07/04/21 1607 08/16/21 1620 02/23/22 1218  PROBNP 425.0* 601.0* 891.0*    CBG: Recent Labs  Lab 05/24/22 1226 05/24/22 1652 05/26/22 0017 05/26/22 0400 05/26/22 0754  GLUCAP 135* 118* 124* 134* 135*    D-Dimer: No results for input(s): DDIMER in the last 72 hours. Hgb A1c: No results for input(s): HGBA1C in the last 72 hours. Lipid Profile: No results for input(s): CHOL, HDL, LDLCALC, TRIG, CHOLHDL, LDLDIRECT in the last 72 hours. Thyroid function studies: No results for input(s): TSH, T4TOTAL, T3FREE, THYROIDAB in the last 72 hours.  Invalid input(s): FREET3 Anemia work up: No results for input(s): VITAMINB12, FOLATE, FERRITIN, TIBC, IRON, RETICCTPCT in the last 72 hours. Sepsis Labs: Recent Labs  Lab 05/22/22 0300 05/23/22 0233 05/24/22 0256 05/26/22 0505  WBC 3.7* 3.5* 3.0* 3.4*    Microbiology Recent Results (from the past 240 hour(s))  MRSA Next Gen by PCR, Nasal     Status: Abnormal   Collection Time: 05/19/22  1:38 AM   Specimen: Nasal Mucosa; Nasal Swab  Result Value Ref Range Status   MRSA by PCR Next Gen DETECTED (A) NOT DETECTED Final    Comment: (NOTE) The GeneXpert MRSA Assay (FDA approved for NASAL specimens only), is one component of a comprehensive MRSA colonization surveillance program. It is not intended to diagnose MRSA infection nor to guide or monitor treatment for MRSA infections. Test performance is not FDA approved in patients less than 26  years old. Performed at [facility], [place] [address], [place], [place] [address]      Medications:    (feeding supplement) PROSource Plus  30 mL Oral Daily   busPIRone  7.5 mg Oral BID   docusate sodium  100 mg Oral BID   famotidine  40 mg Oral QHS   feeding supplement  237 mL  Oral BID BM   macitentan  10 mg Oral Daily   mouth rinse  15 mL Mouth Rinse BID   metoCLOPramide (REGLAN) injection  5 mg Intravenous Q6H   midodrine  5 mg Oral TID WC   multivitamin with minerals  1 tablet Oral Daily   mupirocin ointment  1 application. Nasal BID   pantoprazole (PROTONIX) IV  40 mg Intravenous Q12H   polyethylene glycol  17 g Oral BID   rosuvastatin  5 mg Oral Daily   Selexipag  200 mcg Oral QHS   And   Selexipag  400 mcg Oral Daily   sodium chloride flush  3 mL Intravenous Q12H   sodium chloride  1 g Oral BID WC   sucralfate  1 g Oral BID   tadalafil  20 mg Oral Daily   warfarin  2.5 mg Oral ONCE-1600   Warfarin - Pharmacist Dosing Inpatient   Does not apply q1600   Continuous Infusions:  ondansetron (ZOFRAN) IV        LOS: 9 days   Charlynne Cousins  [facility]  05/26/2022, 10:50 AM

## 2022-05-26 NOTE — NC FL2 (Signed)
[place] LEVEL OF CARE SCREENING TOOL     IDENTIFICATION  Patient Name: Lance Hicks Birthdate: Mar 03, 1946 Sex: male Admission Date (Current Location): 05/17/2022  [facility] and [place] Number:  Herbalist and Address:  [facility],  [place], [place]      Provider Number: [id number]  Attending Physician Name and Address:  Aileen Fass, Tammi Klippel, MD  Relative Name and Phone Number:  Illene Bolus ([phone])    Current Level of Care: Hospital Recommended Level of Care: [place] Prior Approval Number:    Date Approved/Denied:   PASRR Number: [id number]  Discharge Plan: SNF    Current Diagnoses: Patient Active Problem List   Diagnosis Date Noted   FTT (failure to thrive) in adult    Heme positive stool    Chronic anticoagulation    Gastritis    Acute renal failure superimposed on stage 3a chronic kidney disease ([place]) 05/17/2022   Diverticulosis 03/30/2022   Hyponatremia 03/30/2022   Mass of lung 03/30/2022   Medication adverse effect 03/30/2022   Abdominal pain 01/31/2022   Protein-calorie malnutrition, severe ([place]) 01/28/2022   Physical deconditioning 01/25/2022   GERD (gastroesophageal reflux disease) 01/25/2022   Iron deficiency anemia 01/25/2022   Alcohol abuse 01/25/2022   Acute kidney injury superimposed on chronic kidney disease ([place]) 56/38/7564   Acute systolic CHF (congestive heart failure) ([place]) 01/23/2022   Chronic combined systolic and diastolic CHF (congestive heart failure) ([place]) 11/03/2021   ILD (interstitial lung disease) ([place]) 08/04/2021   Fall 06/21/2021   Asthma    Pneumonia due to COVID-19 virus 02/07/2021   Cirrhosis of liver not due to alcohol ([place]) 07/01/2019   Acute on chronic anemia 07/01/2019   Hypotension, chronic 06/06/2018   Enterococcal bacteremia    Pulmonary edema    Strain of deltoid muscle, initial encounter    Pulmonary arterial hypertension ([place])  02/20/2018   Hypokalemia due to excessive renal loss of potassium 02/18/2018   Left ureteral stone 01/23/2018   Anticoagulated on Coumadin 01/04/2018   Localized edema 01/04/2018   Leukocytoclastic vasculitis ([place]) 10/01/2017   Hypersensitivity angiitis ([place]) 10/01/2017   Maculopapular rash 09/03/2017   Epidermoid cyst of skin 08/24/2017   Bilateral pleural effusion 08/09/2017   Pleural effusion, bilateral 08/09/2017   Supratherapeutic INR 07/26/2017   International normalized ratio (INR) raised 07/26/2017   Microscopic hematuria 06/20/2017   Asymptomatic microscopic hematuria 06/20/2017   Atrial flutter ([place]) 03/29/2017   Coronary artery disease involving native coronary artery of native heart with angina pectoris ([place]) 03/29/2017   H/O maze procedure 03/29/2017   History of coronary artery bypass graft 03/29/2017   Hypertensive heart disease with heart failure ([place]) 03/29/2017   Hyperlipidemia 03/29/2017   Coronary arteriosclerosis in native artery 03/29/2017   Long term (current) use of anticoagulants 03/29/2017   Non-sustained ventricular tachycardia ([place]) 03/29/2017   Cough 10/17/2016   Pulmonary nodules 06/12/2016   Multiple nodules of lung 06/12/2016   COPD (chronic obstructive pulmonary disease) ([place]) 06/12/2016   Anxiety 05/10/2016   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   Allergic rhinitis 04/28/2016   [name] 03/20/2016   S/P AVR (aortic valve replacement) 03/20/2016   Dyspnea 02/01/2016   Syncope and collapse 02/01/2016   Lumbar radicular pain 01/19/2016   Lumbar radiculopathy 01/19/2016   Backache 12/14/2015   Essential hypertension 12/14/2015   Chronic midline back pain 12/14/2015   Chronic prostatitis 07/23/2015   Nephrolithiasis 07/23/2015   Chronic atrial  fibrillation ([place]) 07/23/2015    Orientation RESPIRATION BLADDER Height & Weight     Self, Time, Situation, Place (intermittent confusion)  Normal Incontinent,  External catheter Weight: 129 lb 1.6 oz (58.6 kg) Height:  5' 9" (175.3 cm)  BEHAVIORAL SYMPTOMS/MOOD NEUROLOGICAL BOWEL NUTRITION STATUS      Incontinent Diet (Clear Liquid)  AMBULATORY STATUS COMMUNICATION OF NEEDS Skin   Limited Assist Verbally Normal                       Personal Care Assistance Level of Assistance  Bathing, Feeding, Total care, Dressing Bathing Assistance: Limited assistance Feeding assistance: Limited assistance Dressing Assistance: Limited assistance Total Care Assistance: Limited assistance   Functional Limitations Info  Sight, Hearing, Speech Sight Info: Adequate Hearing Info: Adequate Speech Info: Adequate    SPECIAL CARE FACTORS FREQUENCY  PT (By licensed PT), OT (By licensed OT)     PT Frequency: 5x/wk OT Frequency: 5x/wk            Contractures Contractures Info: Not present    Additional Factors Info  Code Status, Allergies, Psychotropic Code Status Info: DNR Allergies Info: Augmentin (Amoxicillin-pot Clavulanate), Amoxicillin, Chicken Allergy, Clavulanic Acid, Pantoprazole Sodium, Tizanidine, Valium (Diazepam), Tape, Wound Dressing Adhesive Psychotropic Info: Buspar         Current Medications (05/26/2022):  This is the current hospital active medication list Current Facility-Administered Medications  Medication Dose Route Frequency Provider Last Rate Last Admin   (feeding supplement) PROSource Plus liquid 30 mL  30 mL Oral Daily Lorella Nimrod, MD   30 mL at 05/25/22 1545   acetaminophen (TYLENOL) tablet 650 mg  650 mg Oral Q6H PRN Lenore Cordia, MD   650 mg at 05/24/22 1626   Or   acetaminophen (TYLENOL) suppository 650 mg  650 mg Rectal Q6H PRN Lenore Cordia, MD       busPIRone (BUSPAR) tablet 7.5 mg  7.5 mg Oral BID Zada Finders R, MD   7.5 mg at 05/26/22 0840   docusate sodium (COLACE) capsule 100 mg  100 mg Oral BID Damita Lack, MD   100 mg at 05/25/22 2102   erythromycin ophthalmic ointment   Left Eye Q6H Charlynne Cousins, MD       famotidine (PEPCID) tablet 40 mg  40 mg Oral QHS Eudelia Bunch, RPH   40 mg at 05/25/22 2102   feeding supplement (ENSURE ENLIVE / ENSURE PLUS) liquid 237 mL  237 mL Oral BID BM Lorella Nimrod, MD   237 mL at 05/26/22 0841   hydrALAZINE (APRESOLINE) injection 10 mg  10 mg Intravenous Q4H PRN Amin, Ankit Chirag, MD       ipratropium-albuterol (DUONEB) 0.5-2.5 (3) MG/3ML nebulizer solution 3 mL  3 mL Nebulization Q4H PRN Amin, Ankit Chirag, MD       macitentan (OPSUMIT) tablet 10 mg  10 mg Oral Daily Zada Finders R, MD   10 mg at 05/26/22 0840   MEDLINE mouth rinse  15 mL Mouth Rinse BID Lorella Nimrod, MD   15 mL at 05/26/22 0842   metoCLOPramide (REGLAN) injection 5 mg  5 mg Intravenous Q6H Charlynne Cousins, MD   5 mg at 05/26/22 0546   metoprolol tartrate (LOPRESSOR) injection 5 mg  5 mg Intravenous Q4H PRN Amin, Ankit Chirag, MD       midodrine (PROAMATINE) tablet 5 mg  5 mg Oral TID WC Marcell Anger, MD   5 mg at 05/26/22 0840  multivitamin with minerals tablet 1 tablet  1 tablet Oral Daily Lorella Nimrod, MD   1 tablet at 05/26/22 0841   mupirocin ointment (BACTROBAN) 2 % 1 application.  1 application. Nasal BID Lorella Nimrod, MD   1 application. at 05/26/22 0842   naphazoline-pheniramine (NAPHCON-A) 0.025-0.3 % ophthalmic solution 1 drop  1 drop Both Eyes QID PRN Charlynne Cousins, MD   1 drop at 05/26/22 0849   ondansetron (ZOFRAN) tablet 4 mg  4 mg Oral Q6H PRN Charlynne Cousins, MD       Or   ondansetron [facility]) 8 mg in sodium chloride 0.9 % 50 mL IVPB  8 mg Intravenous Q6H PRN Charlynne Cousins, MD       pantoprazole (PROTONIX) EC tablet 40 mg  40 mg Oral Daily Aileen Fass, Tammi Klippel, MD       polyethylene glycol (MIRALAX / GLYCOLAX) packet 17 g  17 g Oral BID Amin, Ankit Chirag, MD   17 g at 05/24/22 0924   rosuvastatin (CRESTOR) tablet 5 mg  5 mg Oral Daily Lenore Cordia, MD   5 mg at 05/26/22 8592   Selexipag TABS 200 mcg  200 mcg  Oral QHS Marcell Anger, MD   200 mcg at 05/25/22 2104   And   Selexipag TABS 400 mcg  400 mcg Oral Daily Marcell Anger, MD   400 mcg at 05/26/22 1030   senna-docusate (Senokot-S) tablet 1 tablet  1 tablet Oral QHS PRN Lenore Cordia, MD   1 tablet at 05/17/22 2228   sodium chloride (OCEAN) 0.65 % nasal spray 1 spray  1 spray Each Nare PRN Lorella Nimrod, MD       sodium chloride flush (NS) 0.9 % injection 3 mL  3 mL Intravenous Q12H Zada Finders R, MD   3 mL at 05/26/22 0843   sodium chloride tablet 1 g  1 g Oral BID WC Hall, Carole N, DO   1 g at 05/26/22 7639   spironolactone (ALDACTONE) tablet 25 mg  25 mg Oral Daily Charlynne Cousins, MD       sucralfate (CARAFATE) tablet 1 g  1 g Oral BID Vicie Mutters R, PA-C   1 g at 05/26/22 0841   tadalafil (CIALIS) tablet 20 mg  20 mg Oral Daily Zada Finders R, MD   20 mg at 05/26/22 0840   tiZANidine (ZANAFLEX) tablet 2 mg  2 mg Oral Q8H PRN Charlynne Cousins, MD       torsemide [facility]) tablet 60 mg  60 mg Oral Daily Charlynne Cousins, MD       And   torsemide [facility]) tablet 40 mg  40 mg Oral QHS Charlynne Cousins, MD       traZODone (DESYREL) tablet 50 mg  50 mg Oral QHS PRN Damita Lack, MD   50 mg at 05/24/22 2200   warfarin (COUMADIN) tablet 2.5 mg  2.5 mg Oral ONCE-1600 Madueme, Lenetta Quaker, RPH       Warfarin - Pharmacist Dosing Inpatient   Does not apply q1600 Emiliano Dyer, [facility]   Given at 05/25/22 1722     Discharge Medications: Please see discharge summary for a list of discharge medications.  Relevant Imaging Results:  Relevant Lab Results:   Additional Information SSN: [id number]; Moderna COVID-19 Vaccine 08/13/2020 , 03/22/2020 , 02/19/2020  Vassie Moselle, LCSW

## 2022-05-26 NOTE — Progress Notes (Signed)
Copy of HCPOA placed in pt chart. HCPOA documents provided by Ann Lions (pt neighbor).

## 2022-05-27 LAB — BASIC METABOLIC PANEL
Anion gap: 7 (ref 5–15)
BUN: 15 mg/dL (ref 8–23)
CO2: 30 mmol/L (ref 22–32)
Calcium: 9.5 mg/dL (ref 8.9–10.3)
Chloride: 97 mmol/L — ABNORMAL LOW (ref 98–111)
Creatinine, Ser: 1.2 mg/dL (ref 0.61–1.24)
GFR, Estimated: >60 mL/min (ref >60)
Glucose, Bld: 92 mg/dL (ref 70–99)
Potassium: 3.6 mmol/L (ref 3.5–5.1)
Sodium: 134 mmol/L — ABNORMAL LOW (ref 135–145)

## 2022-05-27 LAB — GLUCOSE, CAPILLARY
Glucose-Capillary: 147 mg/dL — ABNORMAL HIGH (ref 70–99)
Glucose-Capillary: 154 mg/dL — ABNORMAL HIGH (ref 70–99)
Glucose-Capillary: 163 mg/dL — ABNORMAL HIGH (ref 70–99)
Glucose-Capillary: 90 mg/dL (ref 70–99)

## 2022-05-27 LAB — PROTIME-INR
INR: 3.5 — ABNORMAL HIGH (ref 0.8–1.2)
Prothrombin Time: 35.1 seconds — ABNORMAL HIGH (ref 11.4–15.2)

## 2022-05-27 MED ORDER — MORPHINE SULFATE (PF) 2 MG/ML IV SOLN
2.0000 mg | INTRAVENOUS | Status: DC | PRN
  Filled 2022-05-27 (×2): qty 1

## 2022-05-27 MED ORDER — WARFARIN SODIUM 1 MG PO TABS
1.0000 mg | ORAL_TABLET | Freq: Once | ORAL | Status: AC
  Administered 2022-05-27: 1 mg via ORAL
  Filled 2022-05-27: qty 1

## 2022-05-27 NOTE — Progress Notes (Signed)
Daily Progress Note   Patient Name: Lance Hicks       Date: 05/27/2022 DOB: 07-26-1946  Age: 76 y.o. MRN#: [id number] Attending Physician: Charlynne Cousins, MD Primary Care Physician: Elise Benne Admit Date: 05/17/2022  Reason for Consultation/Follow-up: Establishing goals of care  Subjective: I saw Lance Hicks briefly as he was finishing up working with PT after ambulating in hall.  Discussed concerns about continued weakness and recommendation for skilled facility.  He seems more open to this idea today.  Length of Stay: 10  Current Medications: Scheduled Meds:   (feeding supplement) PROSource Plus  30 mL Oral Daily   busPIRone  7.5 mg Oral BID   docusate sodium  100 mg Oral BID   erythromycin   Left Eye Q6H   famotidine  40 mg Oral QHS   feeding supplement  237 mL Oral BID BM   macitentan  10 mg Oral Daily   mouth rinse  15 mL Mouth Rinse BID   metoCLOPramide (REGLAN) injection  5 mg Intravenous Q6H   midodrine  5 mg Oral TID WC   multivitamin with minerals  1 tablet Oral Daily   mupirocin ointment  1 application. Nasal BID   pantoprazole  40 mg Oral Daily   polyethylene glycol  17 g Oral BID   rosuvastatin  5 mg Oral Daily   Selexipag  200 mcg Oral QHS   And   Selexipag  400 mcg Oral Daily   sodium chloride flush  3 mL Intravenous Q12H   sodium chloride  1 g Oral BID WC   spironolactone  25 mg Oral Daily   sucralfate  1 g Oral BID   tadalafil  20 mg Oral Daily   torsemide  60 mg Oral Daily   And   torsemide  40 mg Oral QHS   warfarin  1 mg Oral ONCE-1600   Warfarin - Pharmacist Dosing Inpatient   Does not apply q1600    Continuous Infusions:  ondansetron (ZOFRAN) IV      PRN Meds: acetaminophen **OR** acetaminophen, hydrALAZINE,  ipratropium-albuterol, metoprolol tartrate, naphazoline-pheniramine, ondansetron **OR** ondansetron (ZOFRAN) IV, senna-docusate, sodium chloride, tiZANidine, traZODone  Physical Exam      General: Alert, awake, in no acute distress.  Frail HEENT: No bruits, no goiter, no JVD Heart: Regular rate and  rhythm. No murmur appreciated. Lungs: Good air movement, clear Abdomen: Soft, nontender, nondistended, positive bowel sounds.   Ext: No significant edema Skin: Warm and dry Neuro: Grossly intact, nonfocal.     Vital Signs: BP 93/70 (BP Location: Left Arm)   Pulse 83   Temp (!) 97.5 F (36.4 C) (Oral)   Resp 20   Ht _0  (1.753 m)   Wt 57.8 kg   SpO2 100%   BMI 18.81 kg/m  SpO2: SpO2: 100 % O2 Device: O2 Device: Nasal Cannula O2 Flow Rate: O2 Flow Rate (L/min): 2 L/min  Intake/output summary:  Intake/Output Summary (Last 24 hours) at 05/27/2022 0859 Last data filed at 05/27/2022 0737 Gross per 24 hour  Intake 1080 ml  Output 1850 ml  Net -770 ml    LBM: Last BM Date : 05/25/22 Baseline Weight: Weight: 52 kg Most recent weight: Weight: 57.8 kg       Palliative Assessment/Data:    Flowsheet Rows    Flowsheet Row Most Recent Value  Intake Tab   Referral Department Hospitalist  Unit at Time of Referral ICU  Palliative Care Primary Diagnosis Cardiac  Date Notified 05/23/22  Palliative Care Type Return patient Palliative Care  Reason for referral Clarify Goals of Care  Date of Admission 05/17/22  Date first seen by Palliative Care 05/24/22  # of days Palliative referral response time 1 Day(s)  # of days IP prior to Palliative referral 6  Clinical Assessment   Palliative Performance Scale Score 40%  Psychosocial & Spiritual Assessment   Palliative Care Outcomes   Patient/Family meeting held? Yes  Who was at the meeting? Patient       Patient Active Problem List   Diagnosis Date Noted   Acute renal failure superimposed on stage 3a chronic kidney disease ([place])  05/17/2022   Hyponatremia 03/30/2022   Acute on chronic anemia 07/01/2019   Coronary artery disease involving native coronary artery of native heart with angina pectoris ([place]) 03/29/2017   Chronic combined systolic and diastolic CHF (congestive heart failure) ([place]) 11/03/2021   [name] 03/20/2016   Pulmonary arterial hypertension ([place]) 02/20/2018   Hypotension, chronic 06/06/2018   Chronic atrial fibrillation ([place]) 07/23/2015   COPD (chronic obstructive pulmonary disease) ([place]) 06/12/2016   Heme positive stool    FTT (failure to thrive) in adult    Chronic anticoagulation    Gastritis    Diverticulosis 03/30/2022   Mass of lung 03/30/2022   Medication adverse effect 03/30/2022   Abdominal pain 01/31/2022   Protein-calorie malnutrition, severe ([place]) 01/28/2022   Physical deconditioning 01/25/2022   GERD (gastroesophageal reflux disease) 01/25/2022   Iron deficiency anemia 01/25/2022   Alcohol abuse 01/25/2022   Acute kidney injury superimposed on chronic kidney disease ([place]) 10/62/6948   Acute systolic CHF (congestive heart failure) ([place]) 01/23/2022   ILD (interstitial lung disease) ([place]) 08/04/2021   Fall 06/21/2021   Asthma    Pneumonia due to COVID-19 virus 02/07/2021   Cirrhosis of liver not due to alcohol ([place]) 07/01/2019   Enterococcal bacteremia    Pulmonary edema    Strain of deltoid muscle, initial encounter    Hypokalemia due to excessive renal loss of potassium 02/18/2018   Left ureteral stone 01/23/2018   Anticoagulated on Coumadin 01/04/2018   Localized edema 01/04/2018   Leukocytoclastic vasculitis ([place]) 10/01/2017   Hypersensitivity angiitis ([place]) 10/01/2017   Maculopapular rash 09/03/2017   Epidermoid cyst of skin 08/24/2017   Bilateral pleural effusion 08/09/2017  Pleural effusion, bilateral 08/09/2017   Supratherapeutic INR 07/26/2017   International normalized ratio (INR) raised 07/26/2017   Microscopic hematuria  06/20/2017   Asymptomatic microscopic hematuria 06/20/2017   Atrial flutter ([place]) 03/29/2017   H/O maze procedure 03/29/2017   History of coronary artery bypass graft 03/29/2017   Hypertensive heart disease with heart failure ([place]) 03/29/2017   Hyperlipidemia 03/29/2017   Coronary arteriosclerosis in native artery 03/29/2017   Long term (current) use of anticoagulants 03/29/2017   Non-sustained ventricular tachycardia ([place]) 03/29/2017   Cough 10/17/2016   Pulmonary nodules 06/12/2016   Multiple nodules of lung 06/12/2016   Anxiety 05/10/2016   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   Allergic rhinitis 04/28/2016   S/P AVR (aortic valve replacement) 03/20/2016   Dyspnea 02/01/2016   Syncope and collapse 02/01/2016   Lumbar radicular pain 01/19/2016   Lumbar radiculopathy 01/19/2016   Backache 12/14/2015   Essential hypertension 12/14/2015   Chronic midline back pain 12/14/2015   Chronic prostatitis 07/23/2015   Nephrolithiasis 07/23/2015    Palliative Care Assessment & Plan   Patient Profile: 76 y.o. male  with past medical history of chronic combined systolic and diastolic heart failure with a EF of 45%, CAD status post CABG, chronic A-fib, aortic stenosis status post mechanical valve replacement, pulmonary hypertension, chronic kidney disease, cirrhosis, anemia admitted on 05/17/2022 with renal failure, hyponatremia and combined heart failure.  Further work-up has revealed concern for aspiration.  Palliative consulted for goals of care.  Recommendations/Plan: DNR/DNI Lance Hicks minimizes his comorbidities overall, but he is more open to conversations about the fact that this is not a fixable problem and he is also open more to conversation about recommendation for skilled facility at time of discharge. Would recommend skilled facility with palliative care to follow as an outpatient.  Goals of Care and Additional Recommendations: Limitations on Scope of Treatment: Full Scope  Treatment  Code Status:    Code Status Orders  (From admission, onward)           Start     Ordered   05/25/22 1453  Do not attempt resuscitation (DNR)  Continuous       Question Answer Comment  In the event of cardiac or respiratory ARREST Do not call a "code blue"   In the event of cardiac or respiratory ARREST Do not perform Intubation, CPR, defibrillation or ACLS   In the event of cardiac or respiratory ARREST Use medication by any route, position, wound care, and other measures to relive pain and suffering. May use oxygen, suction and manual treatment of airway obstruction as needed for comfort.      05/25/22 1453           Code Status History     Date Active Date Inactive Code Status Order ID Comments User Context   05/17/2022 1933 05/25/2022 1453 Full Code [id number]  Lenore Cordia, MD Inpatient   01/25/2022 1641 02/05/2022 1922 DNR [id number]  Lin Landsman, NP Inpatient   01/23/2022 1818 01/25/2022 1641 Full Code [id number]  Lequita Halt, MD ED   11/03/2021 1718 11/09/2021 1727 Full Code [id number]  Consuelo Pandy, PA-C Inpatient   09/29/2021 1430 10/04/2021 2049 Full Code [id number]  Jolaine Artist, MD Inpatient   05/14/2018 1145 05/23/2018 1709 Full Code [id number]  Suresh, Audi Inpatient   01/18/2018 1104 01/18/2018 1529 Full Code [id number]  Bensimhon, Shaune Pascal, MD Inpatient      Advance Directive Documentation    Flowsheet Row Most  Recent Value  Type of Advance Directive Healthcare Power of Attorney  Pre-existing out of facility DNR order (yellow form or pink MOST form) --  "MOST" Form in Place? --       Prognosis: Guarded  Discharge Planning: To be determined  Care plan was discussed with patient, bedside RN, PT Thank you for allowing the Palliative Medicine Team to assist in the care of this patient.  Micheline Rough, MD  Please contact Palliative Medicine Team phone at [phone] for questions and concerns.

## 2022-05-27 NOTE — Progress Notes (Signed)
Pt O2 86% on 8L HFNC , at this time. Pt O2 increased to 10L; HFNC. Pt given PRN Duoneb @ 3500 (see MAR). Pt daughter Christie Beckers) at bedside during this time. Daughter stated that she "gave him vitamin water" and that she knew that she wasn't supposed to. Daughter was aware of aspiration precautions by charge RN, prior to administration of drink. Daughter was also aware of aspiration precautions by this RN.

## 2022-05-27 NOTE — Progress Notes (Signed)
Pt O2 at 83% with 4L [name]. Pt placed on 8L HFNC. Charge RN at bedside during this time, along with this Therapist, sports. Pt O2 recovered to 90% on 8L of HFNC.

## 2022-05-27 NOTE — Progress Notes (Signed)
[place] for Warfarin Indication: Mechanical AVR and Afib  Allergies  Allergen Reactions   Augmentin [Amoxicillin-Pot Clavulanate] Anaphylaxis and Diarrhea    "Upset stomach"   Amoxicillin     Other reaction(s): Nausea/Vomiting   Chicken Allergy Other (See Comments)    Other reaction(s): Unknown   Clavulanic Acid     Other reaction(s): Nausea/Vomiting   Pantoprazole Sodium Nausea Only    Gets gassy and starting itching like crazy Gets gassy and starting itching like crazy   Tizanidine     Other reaction(s): See Comments   Valium [Diazepam] Other (See Comments)    HA and Abd pain.   Tape Rash and Other (See Comments)    Surgical tape   Wound Dressing Adhesive Other (See Comments) and Rash    Surgical tape Surgical tape Surgical tape    Patient Measurements: Height: _0  (175.3 cm) Weight: 57.8 kg (127 lb 6.4 oz) IBW/kg (Calculated) : 70.7  Vital Signs: Temp: 97.5 F (36.4 C) (06/03 0429) Temp Source: Oral (06/03 0429) BP: 93/70 (06/03 0429) Pulse Rate: 83 (06/03 0429)  Labs: Recent Labs    05/25/22 0542 05/26/22 0505 05/27/22 0543  HGB  --  9.3*  --   HCT  --  30.8*  --   PLT  --  129*  --   LABPROT 33.1* 29.9* 35.1*  INR 3.3* 2.9* 3.5*  CREATININE 1.33* 1.17 1.20    Estimated Creatinine Clearance: 43.5 mL/min (by C-G formula based on SCr of 1.2 mg/dL).  Information Confirmed with [facility] note 04/11/22: -PTA warfarin dose: 2.5 mg daily except 5 mg Mon/Wed/Sat  -INR goal: 2.5 - 3  Last dose taken PTA was on 5/22  Assessment: 76 yo male with hx mechanical aortic valve and afib on chronic warfarin admitted with acute on chronic anemia and melena. Warfarin was initially held on admission and GI consulted. Pharmacy consulted to resume warfarin on 5/27 as no invasive procedures planned, Hgb stable, and patient at high risk for thrombosis due to mechanical valve.   Today, 05/27/2022 INR = 3.5 and above goal  Hgb/plt  low but stable. No overt bleeding noted High aspiration risk, patient aware. Diet to be advanced to dysphagia 2 diet.  INR has been labile on lower dose than prescribed PTA - reduced PO intake likely contributing.   Goal of Therapy:  INR 2.5-3 per outpatient clinic notes   Plan:  Warfarin 1 mg po x 1 Continue daily INR and CBC Monitor for signs of bleeding, and intake tolerance Due to mechanical valve, patient would need to be bridged if INR subtherapeutic   Royetta Asal, PharmD, [place] Please utilize Amion for appropriate phone number to reach the unit pharmacist ([place]) 05/27/2022 7:57 AM

## 2022-05-27 NOTE — Progress Notes (Addendum)
Pt O2 87-90%; while on 2L [place] . Pt placed on 4L [place]. MD notified of pt's increased oxygen needs. Pt completed incentive spirometer, with poor effort. Four repetitions completed with a goal of 250 achieved. Pt was educated on proper breathing techniques. Pt verbalized that he "needs a break" from oxygen. Pt was educated on purpose of oxygen and verbalized understanding.

## 2022-05-27 NOTE — TOC Progression Note (Signed)
Transition of Care [facility]) - Progression Note    Patient Details  Name: GADGE HERMIZ MRN: [id number] Date of Birth: Dec 03, 1946  Transition of [facility] CM/SW Contact  Ross Ludwig,  Phone Number: 05/27/2022, 6:12 PM  Clinical Narrative:     Patient requesting [facility] [place], CSW contacted Olivia Mackie to ask her to review patient.  Waiting on bed offers.  Expected Discharge Plan: Home/Self Care Barriers to Discharge: Continued Medical Work up  Expected Discharge Plan and Services Expected Discharge Plan: Home/Self Care   Discharge Planning Services: CM Consult   Living arrangements for the past 2 months: Single Family Home                                       Social Determinants of Health (SDOH) Interventions    Readmission Risk Interventions    05/22/2022   10:34 AM  Readmission Risk Prevention Plan  Medication Review (RN Care Manager) Complete  HRI or [place] Complete  SW Recovery Care/Counseling Consult Complete  [place] Not Applicable

## 2022-05-27 NOTE — Progress Notes (Signed)
[facility] PROGRESS NOTE    Progress Note  Lance Hicks  VAP:[id number] DOB: 08-22-1946 DOA: 05/17/2022 PCP: [name], PA-C     Brief Narrative:   Lance Hicks is an 76 y.o. male past medical history significant for chronic combined systolic and diastolic heart failure with an EF of 45%, CAD status post CABG, chronic atrial fibrillation and aortic stenosis status post mechanical aortic valve replacement on Coumadin, pulmonary hypertension, chronic kidney disease stage III yea, hepatic cirrhosis, anemia of chronic disease admitted to [facility] for acute kidney injury and hyponatremia, was seen by physician on May 2023 obtain blood work that showed a hemoglobin of 9 Hemoccult positive, transferred to [place] seen by [facility] [place], Coumadin was initially held which was eventually restarted as no procedure was plan.  His renal function improved and is now close to baseline.  He is at moderate risk for aspiration and a barium swallow was recommended that showed severe pharyngeal dysphagia and poor pharyngeal mobility.  Patient will aspirate regardless of what he consumes.  Speech recommended n.p.o. except for ice chips.  Risk and benefits were explained to the patient and he would like to proceed with a diet he was placed on a dysphagia 2 diet.  His home dose of Aldactone and spironolactone have been resumed.    Assessment/Plan:   Acute renal failure superimposed on stage 3a chronic kidney disease ([name]) Baseline creatinine around 1. Appears euvolemic. His creatinine this morning is 1.2.   Physical therapy evaluated the patient, he will need to go to skilled nursing facility. Awaiting skilled nursing facility placement.  At high risk for aspiration: He would like his diet advanced, despite knowing the risk I think it is reasonable. Continue a dysphagia 2 diet.  Chronic hyponatremia He wants to be on a diet placed on a dysphagia 2 diet. See above for further  details regarding his diet.  Acute on chronic anemia Baseline like around 11 denies any bleeding FOBT positive. GI was consulted who deemed him high risk for surgical intervention due to his cardiopulmonary disease they recommended no further intervention. He status post IV iron. Hemoglobin is relatively stable  Coronary artery disease involving native coronary artery of native heart with angina pectoris ([name]) Denies any chest pain continue statins and Coumadin.  Chronic combined systolic and diastolic CHF (congestive heart failure) ([name]) With an EF of 45%.  He is not on a beta-blocker due to his history of bradycardia and pauses, not a candidate for ACE inhibitor or ARB due to chronic renal disease. Torsemide and Aldactone were held due to his acute kidney injury, his creatinine has improved. He is currently on midodrine for support. Started on gentle fluids as above. Now positive by 1.5 L.  History of mechanical aortic valve: Continue Coumadin per pharmacy. INR this morning is 3.5.  Pulmonary arterial hypertension: Continue home selexipag, macitentan, tadalafil.  Chronic hypotension: Continue midodrine.  Chronic atrial fibrillation: Rate control now on Coumadin INR is elevated see above. Not on beta-blocker due to his history of bradycardia  Conjunctivitis: Continue ophthalmic eye antibiotic solutions  Severe protein caloric malnutrition Estimated body mass index is 18.81 kg/m as calculated from the following:   Height as of this encounter: _0  (1.753 m).   Weight as of this encounter: 57.8 kg. Malnutrition Type:  Nutrition Problem: Severe Malnutrition Etiology: chronic illness   Malnutrition Characteristics:  Signs/Symptoms: moderate muscle depletion, severe fat depletion, severe muscle depletion   Nutrition Interventions:  Interventions: Ensure Enlive (each  supplement provides 350kcal and 20 grams of protein), MVI    DVT prophylaxis: Coumadin Family  Communication:none Status is: Inpatient Remains inpatient appropriate because: Aspiration, acute kidney injury and worsening hyponatremia    Code Status:     Code Status Orders  (From admission, onward)           Start     Ordered   05/17/22 1932  Full code  Continuous        05/17/22 1933           Code Status History     Date Active Date Inactive Code Status Order ID Comments User Context   01/25/2022 1641 02/05/2022 1922 DNR [id number]  Lin Landsman, NP Inpatient   01/23/2022 1818 01/25/2022 1641 Full Code [id number]  Lequita Halt, MD ED   11/03/2021 1718 11/09/2021 1727 Full Code [id number]  Consuelo Pandy, PA-C Inpatient   09/29/2021 1430 10/04/2021 2049 Full Code [id number]  Jolaine Artist, MD Inpatient   05/14/2018 1145 05/23/2018 1709 Full Code [id number]  Mihail, Prettyman, PA-C Inpatient   01/18/2018 1104 01/18/2018 1529 Full Code [id number]  Bensimhon, Shaune Pascal, MD Inpatient      Advance Directive Documentation    Flowsheet Row Most Recent Value  Type of Advance Directive Healthcare Power of Attorney  Pre-existing out of facility DNR order (yellow form or pink MOST form) --  "MOST" Form in Place? --         IV Access:   Peripheral IV   Procedures and diagnostic studies:   No results found.   Medical Consultants:   None.   Subjective:    Lance Hicks willing to go to rehab, no complaints this morning.  Objective:    Vitals:   05/26/22 1227 05/26/22 2042 05/27/22 0429 05/27/22 0521  BP: (!) 131/106 116/73 93/70   Pulse: 72 89 83   Resp: _0 Temp: 98.8 F (37.1 C) 97.7 F (36.5 C) (!) 97.5 F (36.4 C)   TempSrc: Oral Oral Oral   SpO2: 91% 100% 100%   Weight:    57.8 kg  Height:       SpO2: 100 % O2 Flow Rate (L/min): 2 L/min   Intake/Output Summary (Last 24 hours) at 05/27/2022 0947 Last data filed at 05/27/2022 8421 Gross per 24 hour  Intake 1080 ml  Output 1650 ml  Net -570 ml    Filed Weights    05/25/22 0500 05/26/22 0415 05/27/22 0521  Weight: 58.7 kg 58.6 kg 57.8 kg    Exam: General exam: In no acute distress. Respiratory system: Good air movement and clear to auscultation. Cardiovascular system: S1 & S2 heard, RRR. No JVD. Gastrointestinal system: Abdomen is nondistended, soft and nontender.  Extremities: No pedal edema. Skin: No rashes, lesions or ulcers Psychiatry: Judgement and insight appear normal. Mood & affect appropriate. Data Reviewed:    Labs: Basic Metabolic Panel: Recent Labs  Lab 05/21/22 0309 05/22/22 0300 05/23/22 0233 05/24/22 0256 05/25/22 0542 05/26/22 0505 05/27/22 0543  NA 125* 126* 125* 127* 130* 133* 134*  K 4.3 4.5 4.7 4.6 4.3 4.2 3.6  CL 93* 94* 93* 94* 98 103 97*  CO2 _1 GLUCOSE 94 81 71 79 90 146* 92  BUN 39* 39* 36* 31* 31* 27* 15  CREATININE 1.61* 1.72* 1.51* 1.29* 1.33* 1.17 1.20  CALCIUM 9.5 9.6 9.4 9.9 10.2 9.6 9.5  MG 2.3 2.1 2.0 2.0 2.2  --   --  PHOS  --   --  2.6  --   --   --   --     GFR Estimated Creatinine Clearance: 43.5 mL/min (by C-G formula based on SCr of 1.2 mg/dL). Liver Function Tests: Recent Labs  Lab 05/21/22 0309  AST 27  ALT 17  ALKPHOS 71  BILITOT 3.2*  PROT 7.1  ALBUMIN 3.2*    No results for input(s): LIPASE, AMYLASE in the last 168 hours. No results for input(s): AMMONIA in the last 168 hours. Coagulation profile Recent Labs  Lab 05/23/22 0233 05/24/22 0256 05/25/22 0542 05/26/22 0505 05/27/22 0543  INR 3.2* 4.0* 3.3* 2.9* 3.5*    COVID-19 Labs  No results for input(s): DDIMER, FERRITIN, LDH, CRP in the last 72 hours.  Lab Results  Component Value Date   SARSCOV2NAA NEGATIVE 02/04/2022   [place] NEGATIVE 01/23/2022   [place] NEGATIVE 09/29/2021   [place] Not Detected 06/17/2021    CBC: Recent Labs  Lab 05/21/22 0309 05/22/22 0300 05/23/22 0233 05/24/22 0256 05/26/22 0505  WBC 3.5* 3.7* 3.5* 3.0* 3.4*  NEUTROABS 2.7 2.9 2.5 2.0  --    HGB 8.4* 8.6* 8.6* 8.9* 9.3*  HCT 28.0* 27.1* 27.6* 28.7* 30.8*  MCV 79.1* 77.7* 77.5* 78.8* 80.0  PLT 108* 117* 110* 128* 129*    Cardiac Enzymes: No results for input(s): CKTOTAL, CKMB, CKMBINDEX, TROPONINI in the last 168 hours. BNP (last 3 results) Recent Labs    07/04/21 1607 08/16/21 1620 02/23/22 1218  PROBNP 425.0* 601.0* 891.0*    CBG: Recent Labs  Lab 05/26/22 0754 05/26/22 1129 05/26/22 1621 05/26/22 2038 05/27/22 0757  GLUCAP 135* 137* 117* 150* 147*    D-Dimer: No results for input(s): DDIMER in the last 72 hours. Hgb A1c: No results for input(s): HGBA1C in the last 72 hours. Lipid Profile: No results for input(s): CHOL, HDL, LDLCALC, TRIG, CHOLHDL, LDLDIRECT in the last 72 hours. Thyroid function studies: No results for input(s): TSH, T4TOTAL, T3FREE, THYROIDAB in the last 72 hours.  Invalid input(s): FREET3 Anemia work up: No results for input(s): VITAMINB12, FOLATE, FERRITIN, TIBC, IRON, RETICCTPCT in the last 72 hours. Sepsis Labs: Recent Labs  Lab 05/22/22 0300 05/23/22 0233 05/24/22 0256 05/26/22 0505  WBC 3.7* 3.5* 3.0* 3.4*    Microbiology Recent Results (from the past 240 hour(s))  MRSA Next Gen by PCR, Nasal     Status: Abnormal   Collection Time: 05/19/22  1:38 AM   Specimen: Nasal Mucosa; Nasal Swab  Result Value Ref Range Status   MRSA by PCR Next Gen DETECTED (A) NOT DETECTED Final    Comment: (NOTE) The GeneXpert MRSA Assay (FDA approved for NASAL specimens only), is one component of a comprehensive MRSA colonization surveillance program. It is not intended to diagnose MRSA infection nor to guide or monitor treatment for MRSA infections. Test performance is not FDA approved in patients less than 64 years old. Performed at [facility], [place] [address], [place], [place] [address]      Medications:    (feeding supplement) PROSource Plus  30 mL Oral Daily   busPIRone  7.5 mg Oral BID   docusate  sodium  100 mg Oral BID   erythromycin   Left Eye Q6H   famotidine  40 mg Oral QHS   feeding supplement  237 mL Oral BID BM   macitentan  10 mg Oral Daily   mouth rinse  15 mL Mouth Rinse BID   metoCLOPramide (REGLAN) injection  5 mg Intravenous  Q6H   midodrine  5 mg Oral TID WC   multivitamin with minerals  1 tablet Oral Daily   mupirocin ointment  1 application. Nasal BID   pantoprazole  40 mg Oral Daily   polyethylene glycol  17 g Oral BID   rosuvastatin  5 mg Oral Daily   Selexipag  200 mcg Oral QHS   And   Selexipag  400 mcg Oral Daily   sodium chloride flush  3 mL Intravenous Q12H   sodium chloride  1 g Oral BID WC   spironolactone  25 mg Oral Daily   sucralfate  1 g Oral BID   tadalafil  20 mg Oral Daily   torsemide  60 mg Oral Daily   And   torsemide  40 mg Oral QHS   warfarin  1 mg Oral ONCE-1600   Warfarin - Pharmacist Dosing Inpatient   Does not apply q1600   Continuous Infusions:  ondansetron (ZOFRAN) IV        LOS: 10 days   Charlynne Cousins  [facility]  05/27/2022, 9:47 AM

## 2022-05-28 LAB — GLUCOSE, CAPILLARY
Glucose-Capillary: 110 mg/dL — ABNORMAL HIGH (ref 70–99)
Glucose-Capillary: 112 mg/dL — ABNORMAL HIGH (ref 70–99)
Glucose-Capillary: 128 mg/dL — ABNORMAL HIGH (ref 70–99)
Glucose-Capillary: 131 mg/dL — ABNORMAL HIGH (ref 70–99)
Glucose-Capillary: 87 mg/dL (ref 70–99)
Glucose-Capillary: 90 mg/dL (ref 70–99)

## 2022-05-28 LAB — BASIC METABOLIC PANEL
Anion gap: 7 (ref 5–15)
BUN: 26 mg/dL — ABNORMAL HIGH (ref 8–23)
CO2: 31 mmol/L (ref 22–32)
Calcium: 9.4 mg/dL (ref 8.9–10.3)
Chloride: 96 mmol/L — ABNORMAL LOW (ref 98–111)
Creatinine, Ser: 1.35 mg/dL — ABNORMAL HIGH (ref 0.61–1.24)
GFR, Estimated: 55 mL/min — ABNORMAL LOW (ref >60)
Glucose, Bld: 77 mg/dL (ref 70–99)
Potassium: 4.3 mmol/L (ref 3.5–5.1)
Sodium: 134 mmol/L — ABNORMAL LOW (ref 135–145)

## 2022-05-28 LAB — PROTIME-INR
INR: 4.5 (ref 0.8–1.2)
Prothrombin Time: 42 seconds — ABNORMAL HIGH (ref 11.4–15.2)

## 2022-05-28 NOTE — Progress Notes (Signed)
Per MD orders, I tried to decrease oxygen levels to wean patient off oxygen. Patient was on 2 liter nasal cannula, I tried to wean patient down to 1 liter. Patient started to desaturate down to 82 % on 1 liter. Oxygen increased to 5 liter nasal cannula. Patient is now sating at 90 %. Will leave patient on 5 liters for now and continue to monitor.

## 2022-05-28 NOTE — Progress Notes (Signed)
CRITICAL VALUE STICKER  CRITICAL VALUE: INR 4.5  RECEIVER (on-site recipient of call): Gwyndolyn Saxon LPN  [place] NOTIFIED: 05/28/2022 at 0758  MESSENGER (representative from lab): Mardene Celeste  MD NOTIFIED: Charlynne Cousins MD  TIME OF NOTIFICATION: 3552  RESPONSE: awaiting response from MD. As of 1856 no new orders regarding PT/INR levels.

## 2022-05-28 NOTE — Progress Notes (Signed)
[facility] PROGRESS NOTE    Progress Note  Lance Hicks  QPR:[id number] DOB: 1946-03-09 DOA: 05/17/2022 PCP: [name], PA-C     Brief Narrative:   Lance Hicks is an 76 y.o. male past medical history significant for chronic combined systolic and diastolic heart failure with an EF of 45%, CAD status post CABG, chronic atrial fibrillation and aortic stenosis status post mechanical aortic valve replacement on Coumadin, pulmonary hypertension, chronic kidney disease stage III yea, hepatic cirrhosis, anemia of chronic disease admitted to [facility] for acute kidney injury and hyponatremia, was seen by physician on May 2023 obtain blood work that showed a hemoglobin of 9 Hemoccult positive, transferred to [place] seen by [facility] [place], Coumadin was initially held which was eventually restarted as no procedure was plan.  His renal function improved and is now close to baseline.  He is at moderate risk for aspiration and a barium swallow was recommended that showed severe pharyngeal dysphagia and poor pharyngeal mobility.  Patient will aspirate regardless of what he consumes.  Speech recommended n.p.o. except for ice chips.  Risk and benefits were explained to the patient and he would like to proceed with a diet he was placed on a dysphagia 2 diet.  His home dose of Aldactone and spironolactone have been resumed.    Assessment/Plan:   Acute renal failure superimposed on stage 3a chronic kidney disease ([place]) Baseline creatinine around 1.  Likely prerenal azotemia in the setting of diuretic therapy. Appears euvolemic. His creatinine this morning is 1.3.   Physical therapy evaluated the patient, he will need to go to skilled nursing facility. Awaiting skilled nursing facility placement. Patient is now DNR/DNI.  At high risk for aspiration: He would like his diet advanced, despite knowing the risk I think it is reasonable. Continue a dysphagia 2 diet. Patient is now  DNR/DNI  Chronic hyponatremia He wants to be on a diet placed on a dysphagia 2 diet. See above for further details regarding his diet.  Acute on chronic anemia Baseline like around 11 denies any bleeding, FOBT positive. GI was consulted who deemed him high risk for surgical intervention due to his cardiopulmonary disease they recommended no further intervention. He status post IV iron. Hemoglobin is relatively stable  Coronary artery disease involving native coronary artery of native heart with angina pectoris ([place]) Denies any chest pain continue statins and Coumadin.  Chronic combined systolic and diastolic CHF (congestive heart failure) ([place]) With an EF of 45%.  He is not on a beta-blocker due to his history of bradycardia and pauses, not a candidate for ACE inhibitor or ARB due to chronic renal disease. Torsemide and Aldactone were held due to his acute kidney injury, his creatinine has improved. He is currently on midodrine for support. Started on gentle fluids as above. Now positive by 1.5 L.  History of mechanical aortic valve: Continue Coumadin per pharmacy. INR this morning is 3.5.  Pulmonary arterial hypertension: Continue home selexipag, macitentan, tadalafil.  Chronic hypotension: Continue midodrine.  Chronic atrial fibrillation: Rate control now on Coumadin INR is elevated see above. Not on beta-blocker due to his history of bradycardia  Conjunctivitis: Continue ophthalmic eye antibiotic solutions  Severe protein caloric malnutrition Estimated body mass index is 19.11 kg/m as calculated from the following:   Height as of this encounter: _0  (1.753 m).   Weight as of this encounter: 58.7 kg. Malnutrition Type:  Nutrition Problem: Severe Malnutrition Etiology: chronic illness   Malnutrition Characteristics:  Signs/Symptoms:  moderate muscle depletion, severe fat depletion, severe muscle depletion   Nutrition Interventions:  Interventions: Ensure  Enlive (each supplement provides 350kcal and 20 grams of protein), MVI    DVT prophylaxis: Coumadin Family Communication:none Status is: Inpatient Remains inpatient appropriate because: Aspiration, acute kidney injury and worsening hyponatremia    Code Status:     Code Status Orders  (From admission, onward)           Start     Ordered   05/17/22 1932  Full code  Continuous        05/17/22 1933           Code Status History     Date Active Date Inactive Code Status Order ID Comments User Context   01/25/2022 1641 02/05/2022 1922 DNR [id number]  Lin Landsman, NP Inpatient   01/23/2022 1818 01/25/2022 1641 Full Code [id number]  Lequita Halt, MD ED   11/03/2021 1718 11/09/2021 1727 Full Code [id number]  Consuelo Pandy, PA-C Inpatient   09/29/2021 1430 10/04/2021 2049 Full Code [id number]  Jolaine Artist, MD Inpatient   05/14/2018 1145 05/23/2018 1709 Full Code [id number]  [place]iven, Kohan, PA-C Inpatient   01/18/2018 1104 01/18/2018 1529 Full Code [id number]  Bensimhon, Shaune Pascal, MD Inpatient      Advance Directive Documentation    Flowsheet Row Most Recent Value  Type of Advance Directive Healthcare Power of Attorney  Pre-existing out of facility DNR order (yellow form or pink MOST form) --  "MOST" Form in Place? --         IV Access:   Peripheral IV   Procedures and diagnostic studies:   No results found.   Medical Consultants:   None.   Subjective:    Lance Hicks no complaints  Objective:    Vitals:   05/27/22 2032 05/28/22 0300 05/28/22 0500 05/28/22 0619  BP: 104/63     Pulse: 81     Resp: 20     Temp: 99 F (37.2 C)     TempSrc:      SpO2: 100% 100%  98%  Weight:   58.7 kg   Height:       SpO2: 98 % O2 Flow Rate (L/min): (S) 2 L/min   Intake/Output Summary (Last 24 hours) at 05/28/2022 0926 Last data filed at 05/28/2022 0843 Gross per 24 hour  Intake 1156 ml  Output 650 ml  Net 506 ml    Filed Weights    05/26/22 0415 05/27/22 0521 05/28/22 0500  Weight: 58.6 kg 57.8 kg 58.7 kg    Exam: General exam: In no acute distress. Respiratory system: Good air movement and clear to auscultation. Cardiovascular system: S1 & S2 heard, RRR. No JVD. Gastrointestinal system: Abdomen is nondistended, soft and nontender.  Extremities: No pedal edema. Skin: No rashes, lesions or ulcers Psychiatry: Judgement and insight appear normal. Mood & affect appropriate. Data Reviewed:    Labs: Basic Metabolic Panel: Recent Labs  Lab 05/22/22 0300 05/23/22 0233 05/24/22 0256 05/25/22 0542 05/26/22 0505 05/27/22 0543 05/28/22 0534  NA 126* 125* 127* 130* 133* 134* 134*  K 4.5 4.7 4.6 4.3 4.2 3.6 4.3  CL 94* 93* 94* 98 103 97* 96*  CO2 _0 GLUCOSE 81 71 79 90 146* 92 77  BUN 39* 36* 31* 31* 27* 15 26*  CREATININE 1.72* 1.51* 1.29* 1.33* 1.17 1.20 1.35*  CALCIUM 9.6 9.4 9.9 10.2 9.6 9.5 9.4  MG 2.1 2.0 2.0  2.2  --   --   --   PHOS  --  2.6  --   --   --   --   --     GFR Estimated Creatinine Clearance: 39.3 mL/min (A) (by C-G formula based on SCr of 1.35 mg/dL (H)). Liver Function Tests: No results for input(s): AST, ALT, ALKPHOS, BILITOT, PROT, ALBUMIN in the last 168 hours.  No results for input(s): LIPASE, AMYLASE in the last 168 hours. No results for input(s): AMMONIA in the last 168 hours. Coagulation profile Recent Labs  Lab 05/24/22 0256 05/25/22 0542 05/26/22 0505 05/27/22 0543 05/28/22 0534  INR 4.0* 3.3* 2.9* 3.5* 4.5*    COVID-19 Labs  No results for input(s): DDIMER, FERRITIN, LDH, CRP in the last 72 hours.  Lab Results  Component Value Date   SARSCOV2NAA NEGATIVE 02/04/2022   [place] NEGATIVE 01/23/2022   [place] NEGATIVE 09/29/2021   [place] Not Detected 06/17/2021    CBC: Recent Labs  Lab 05/22/22 0300 05/23/22 0233 05/24/22 0256 05/26/22 0505  WBC 3.7* 3.5* 3.0* 3.4*  NEUTROABS 2.9 2.5 2.0  --   HGB 8.6* 8.6* 8.9* 9.3*  HCT  27.1* 27.6* 28.7* 30.8*  MCV 77.7* 77.5* 78.8* 80.0  PLT 117* 110* 128* 129*    Cardiac Enzymes: No results for input(s): CKTOTAL, CKMB, CKMBINDEX, TROPONINI in the last 168 hours. BNP (last 3 results) Recent Labs    07/04/21 1607 08/16/21 1620 02/23/22 1218  PROBNP 425.0* 601.0* 891.0*    CBG: Recent Labs  Lab 05/27/22 1601 05/27/22 2029 05/28/22 0049 05/28/22 0503 05/28/22 0748  GLUCAP 163* 90 110* 87 90    D-Dimer: No results for input(s): DDIMER in the last 72 hours. Hgb A1c: No results for input(s): HGBA1C in the last 72 hours. Lipid Profile: No results for input(s): CHOL, HDL, LDLCALC, TRIG, CHOLHDL, LDLDIRECT in the last 72 hours. Thyroid function studies: No results for input(s): TSH, T4TOTAL, T3FREE, THYROIDAB in the last 72 hours.  Invalid input(s): FREET3 Anemia work up: No results for input(s): VITAMINB12, FOLATE, FERRITIN, TIBC, IRON, RETICCTPCT in the last 72 hours. Sepsis Labs: Recent Labs  Lab 05/22/22 0300 05/23/22 0233 05/24/22 0256 05/26/22 0505  WBC 3.7* 3.5* 3.0* 3.4*    Microbiology Recent Results (from the past 240 hour(s))  MRSA Next Gen by PCR, Nasal     Status: Abnormal   Collection Time: 05/19/22  1:38 AM   Specimen: Nasal Mucosa; Nasal Swab  Result Value Ref Range Status   MRSA by PCR Next Gen DETECTED (A) NOT DETECTED Final    Comment: (NOTE) The GeneXpert MRSA Assay (FDA approved for NASAL specimens only), is one component of a comprehensive MRSA colonization surveillance program. It is not intended to diagnose MRSA infection nor to guide or monitor treatment for MRSA infections. Test performance is not FDA approved in patients less than 60 years old. Performed at [facility], [place] [address] [place], [place] [address]      Medications:    (feeding supplement) PROSource Plus  30 mL Oral Daily   busPIRone  7.5 mg Oral BID   docusate sodium  100 mg Oral BID   erythromycin   Left Eye Q6H    famotidine  40 mg Oral QHS   feeding supplement  237 mL Oral BID BM   macitentan  10 mg Oral Daily   mouth rinse  15 mL Mouth Rinse BID   metoCLOPramide (REGLAN) injection  5 mg Intravenous Q6H   midodrine  5  mg Oral TID WC   multivitamin with minerals  1 tablet Oral Daily   pantoprazole  40 mg Oral Daily   polyethylene glycol  17 g Oral BID   rosuvastatin  5 mg Oral Daily   Selexipag  200 mcg Oral QHS   And   Selexipag  400 mcg Oral Daily   sodium chloride flush  3 mL Intravenous Q12H   sodium chloride  1 g Oral BID WC   spironolactone  25 mg Oral Daily   sucralfate  1 g Oral BID   tadalafil  20 mg Oral Daily   torsemide  60 mg Oral Daily   And   torsemide  40 mg Oral QHS   Warfarin - Pharmacist Dosing Inpatient   Does not apply q1600   Continuous Infusions:  ondansetron (ZOFRAN) IV        LOS: 11 days   Charlynne Cousins  [facility]  05/28/2022, 9:26 AM

## 2022-05-28 NOTE — Progress Notes (Signed)
[place] for Warfarin Indication: Mechanical AVR and Afib  Allergies  Allergen Reactions   Augmentin [Amoxicillin-Pot Clavulanate] Anaphylaxis and Diarrhea    "Upset stomach"   Amoxicillin     Other reaction(s): Nausea/Vomiting   Chicken Allergy Other (See Comments)    Other reaction(s): Unknown   Clavulanic Acid     Other reaction(s): Nausea/Vomiting   Pantoprazole Sodium Nausea Only    Gets gassy and starting itching like crazy Gets gassy and starting itching like crazy   Tizanidine     Other reaction(s): See Comments   Valium [Diazepam] Other (See Comments)    HA and Abd pain.   Tape Rash and Other (See Comments)    Surgical tape   Wound Dressing Adhesive Other (See Comments) and Rash    Surgical tape Surgical tape Surgical tape    Patient Measurements: Height: _0  (175.3 cm) Weight: 58.7 kg (129 lb 6.4 oz) IBW/kg (Calculated) : 70.7  Vital Signs:    Labs: Recent Labs    05/26/22 0505 05/27/22 0543 05/28/22 0534  HGB 9.3*  --   --   HCT 30.8*  --   --   PLT 129*  --   --   LABPROT 29.9* 35.1* 42.0*  INR 2.9* 3.5* 4.5*  CREATININE 1.17 1.20 1.35*    Estimated Creatinine Clearance: 39.3 mL/min (A) (by C-G formula based on SCr of 1.35 mg/dL (H)).  Information Confirmed with [facility] note 04/11/22: -PTA warfarin dose: 2.5 mg daily except 5 mg Mon/Wed/Sat  -INR goal: 2.5 - 3  Last dose taken PTA was on 5/22  Assessment: 76 yo male with hx mechanical aortic valve and afib on chronic warfarin admitted with acute on chronic anemia and melena. Warfarin was initially held on admission and GI consulted. Pharmacy consulted to resume warfarin on 5/27 as no invasive procedures planned, Hgb stable, and patient at high risk for thrombosis due to mechanical valve.   Today, 05/28/2022 INR = 4.5 and above goal  Hgb/plt low but stable. No overt bleeding noted High aspiration risk, patient aware. Diet to be advanced to dysphagia 2  diet.  INR has been labile on lower dose than prescribed PTA - reduced PO intake likely contributing.   Goal of Therapy:  INR 2.5-3 per outpatient clinic notes   Plan:  No warfarin today Continue daily INR and CBC Monitor for signs of bleeding, and intake tolerance Due to mechanical valve, patient would need to be bridged if INR subtherapeutic   Royetta Asal, PharmD, [place] Please utilize Amion for appropriate phone number to reach the unit pharmacist ([place]) 05/28/2022 10:07 AM

## 2022-05-29 LAB — BASIC METABOLIC PANEL
Anion gap: 9 (ref 5–15)
BUN: 35 mg/dL — ABNORMAL HIGH (ref 8–23)
CO2: 30 mmol/L (ref 22–32)
Calcium: 9.7 mg/dL (ref 8.9–10.3)
Chloride: 93 mmol/L — ABNORMAL LOW (ref 98–111)
Creatinine, Ser: 1.38 mg/dL — ABNORMAL HIGH (ref 0.61–1.24)
GFR, Estimated: 53 mL/min — ABNORMAL LOW (ref >60)
Glucose, Bld: 111 mg/dL — ABNORMAL HIGH (ref 70–99)
Potassium: 4.5 mmol/L (ref 3.5–5.1)
Sodium: 132 mmol/L — ABNORMAL LOW (ref 135–145)

## 2022-05-29 LAB — PROTIME-INR
INR: 4.4 (ref 0.8–1.2)
Prothrombin Time: 41.9 seconds — ABNORMAL HIGH (ref 11.4–15.2)

## 2022-05-29 LAB — GLUCOSE, CAPILLARY
Glucose-Capillary: 111 mg/dL — ABNORMAL HIGH (ref 70–99)
Glucose-Capillary: 140 mg/dL — ABNORMAL HIGH (ref 70–99)

## 2022-05-29 LAB — MAGNESIUM: Magnesium: 1.9 mg/dL (ref 1.7–2.4)

## 2022-05-29 MED ORDER — PANTOPRAZOLE SODIUM 40 MG IV SOLR: 40.0000 mg | INTRAVENOUS | Status: DC

## 2022-05-29 MED ORDER — HALOPERIDOL LACTATE 2 MG/ML PO CONC
0.5000 mg | ORAL | Status: DC | PRN
  Filled 2022-05-29: qty 5

## 2022-05-29 MED ORDER — SODIUM CHLORIDE 0.9 % IV SOLN: 250.0000 mL | INTRAVENOUS | Status: DC | PRN

## 2022-05-29 MED ORDER — MORPHINE SULFATE (PF) 4 MG/ML IV SOLN
4.0000 mg | Freq: Once | INTRAVENOUS | Status: AC
  Administered 2022-05-29: 4 mg via INTRAVENOUS

## 2022-05-29 MED ORDER — FAMOTIDINE IN NACL 20-0.9 MG/50ML-% IV SOLN: 20.0000 mg | Freq: Every day | INTRAVENOUS | Status: DC

## 2022-05-29 MED ORDER — LORAZEPAM 2 MG/ML IJ SOLN: 1.0000 mg | INTRAMUSCULAR | Status: DC | PRN

## 2022-05-29 MED ORDER — HALOPERIDOL LACTATE 5 MG/ML IJ SOLN: 0.5000 mg | INTRAMUSCULAR | Status: DC | PRN

## 2022-05-29 MED ORDER — SODIUM CHLORIDE 0.9% FLUSH
3.0000 mL | Freq: Two times a day (BID) | INTRAVENOUS | Status: DC
  Administered 2022-05-29 – 2022-05-30 (×3): 3 mL via INTRAVENOUS

## 2022-05-29 MED ORDER — LORAZEPAM 2 MG/ML PO CONC
1.0000 mg | ORAL | Status: DC | PRN
  Filled 2022-05-29: qty 0.5

## 2022-05-29 MED ORDER — ENSURE ENLIVE PO LIQD: 237.0000 mL | Freq: Two times a day (BID) | ORAL | 12 refills | Status: DC

## 2022-05-29 MED ORDER — HALOPERIDOL 0.5 MG PO TABS
0.5000 mg | ORAL_TABLET | ORAL | Status: DC | PRN
  Filled 2022-05-29: qty 1

## 2022-05-29 MED ORDER — MORPHINE SULFATE (PF) 4 MG/ML IV SOLN
INTRAVENOUS | Status: AC
  Filled 2022-05-29: qty 1

## 2022-05-29 MED ORDER — MORPHINE SULFATE (PF) 2 MG/ML IV SOLN
2.0000 mg | INTRAVENOUS | Status: DC | PRN
  Administered 2022-05-29 – 2022-05-30 (×2): 2 mg via INTRAVENOUS
  Filled 2022-05-29: qty 1

## 2022-05-29 MED ORDER — ONDANSETRON 4 MG PO TBDP: 4.0000 mg | ORAL_TABLET | Freq: Four times a day (QID) | ORAL | Status: DC | PRN

## 2022-05-29 MED ORDER — SODIUM CHLORIDE 0.9% FLUSH: 3.0000 mL | INTRAVENOUS | Status: DC | PRN

## 2022-05-29 MED ORDER — LORAZEPAM 1 MG PO TABS: 1.0000 mg | ORAL_TABLET | ORAL | Status: DC | PRN

## 2022-05-29 MED ORDER — ONDANSETRON HCL 4 MG/2ML IJ SOLN: 4.0000 mg | Freq: Four times a day (QID) | INTRAMUSCULAR | Status: DC | PRN

## 2022-05-29 NOTE — Progress Notes (Signed)
Date and time results received: _0 /04/2022@ 0539  Test:Blood Critical Value: INR 4.4 Name of Provider Notified: DR Hal Hope  Orders Received? Or Actions Taken? Notified to Pharmacist per MD.

## 2022-05-29 NOTE — Progress Notes (Signed)
Central tele called for Patient had 5 runs of V-tach x 2. Notified to Dr Hal Hope. Md order some stat labs BMP and Magnesium. Will continue to monitor.

## 2022-05-29 NOTE — Progress Notes (Signed)
This pt was referred for hospice services at the [facility]. I have spoken to the pt's HCPOA [name] and he is in agreement and wants to honor his friends decision. I have reviewed the chart and discussed with my MD. He has been approved for hospice care and we can accept him today if MD in agreement with d/c plan.   Webb Silversmith RN [phone]

## 2022-05-29 NOTE — TOC Progression Note (Addendum)
Transition of Care [facility]) - Progression Note    Patient Details  Name: DECKER COGDELL MRN: [id number] Date of Birth: October 23, 1946  Transition of Care [facility] CM/SW [place], LCSW Phone Number: 05/29/2022, 10:18 AM  Clinical Narrative:    Pt is being recommended for residential hospice at this time. Per MD pt and family are on board with this recommendation. CSW spoke with pt's [place] [phone]) who confirmed plans and requests this pt be referred to [facility] in [place] for residential services.  CSW has referred to [place] for pt to be reviewed for placement in their [place] location. CSW will continue to follow for discharge needs.  Pt has been accepted to [employer] in [place]. Pt has a bed at their facility and is awaiting medical clearance to transfer.   Expected Discharge Plan: Home/Self Care Barriers to Discharge: Continued Medical Work up  Expected Discharge Plan and Services Expected Discharge Plan: Home/Self Care   Discharge Planning Services: CM Consult   Living arrangements for the past 2 months: Single Family Home Expected Discharge Date: 05/29/22                                     Social Determinants of Health (SDOH) Interventions    Readmission Risk Interventions    05/22/2022   10:34 AM  Readmission Risk Prevention Plan  Medication Review (RN Care Manager) Complete  HRI or [place] Complete  SW Recovery Care/Counseling Consult Complete  Palliative Care Screening Not [place] Not Applicable

## 2022-05-29 NOTE — Progress Notes (Signed)
[facility] PROGRESS NOTE    Progress Note  Lance Hicks  YNW:[id number] DOB: 06/07/1946 DOA: 05/17/2022 PCP: [name], PA-C     Brief Narrative:   Lance Hicks is an 76 y.o. male past medical history significant for chronic combined systolic and diastolic heart failure with an EF of 45%, CAD status post CABG, chronic atrial fibrillation and aortic stenosis status post mechanical aortic valve replacement on Coumadin, pulmonary hypertension, chronic kidney disease stage III yea, hepatic cirrhosis, anemia of chronic disease admitted to [facility] for acute kidney injury and hyponatremia, was seen by physician on May 2023 obtain blood work that showed a hemoglobin of 9 Hemoccult positive, transferred to [place] seen by [facility] [place], Coumadin was initially held which was eventually restarted as no procedure was plan.  His renal function improved and is now close to baseline.  He is at moderate risk for aspiration and a barium swallow was recommended that showed severe pharyngeal dysphagia and poor pharyngeal mobility.  Patient will aspirate regardless of what he consumes.  Speech recommended n.p.o. except for ice chips.  Risk and benefits were explained to the patient and he would like to proceed with a diet he was placed on a dysphagia 2 diet.  His home dose of Aldactone and spironolactone have been resumed.    Assessment/Plan:   Acute renal failure superimposed on stage 3a chronic kidney disease ([place]) Patient is now DNR/DNI. Social worker to help try to get him to a residential hospice facility.  Acute respiratory failure with hypoxia secondary to aspiration pneumonia: Speech evaluated him and he was at very high risk of aspiration. He was tried on a dysphagia 2 diet and he started to get short of breath. Palliative Care was involved and he was made a DNR. This morning he became short of breath we gave him morphine and his shortness of breath improved. We had a  long discussion with him about his breathing at this we will continue to happen. He decided to move towards comfort care he wants everything DC'd except medications that are not for comfort and shortness of breath. I have informed Nate's healthcare power of attorney and his daughter of the patient wishes to be moving towards comfort care and he agreed and comfortable with decision. We will DC all medications that are not related to comfort or shortness of breath we will give him morphine IV as needed. We will go ahead and start comfort measures orders.  Try to keep his saturations greater 90%.  Currently on 10 L of high flow nasal cannula.  Chronic hyponatremia He wants to be on a diet placed on a dysphagia 2 diet. See above for further details regarding his diet.  Acute on chronic anemia Moving towards comfort measures.  Coronary artery disease involving native coronary artery of native heart with angina pectoris ([place]) Denies any chest pain continue statins and Coumadin.  Chronic combined systolic and diastolic CHF (congestive heart failure) (HCC) Discontinue diuretic. He is a DNR. Moving towards comfort measures.  History of mechanical aortic valve: Continue Coumadin per pharmacy. INR this morning is 3.5.  Pulmonary arterial hypertension: Discontinue medication, will moving towards comfort measures.  Chronic hypotension: Continue midodrine.  Chronic atrial fibrillation: Moving towards comfort measures.  Conjunctivitis: Continue ophthalmic eye antibiotic solutions  Severe protein caloric malnutrition Estimated body mass index is 19.11 kg/m as calculated from the following:   Height as of this encounter: _0  (1.753 m).   Weight as of this encounter:  58.7 kg. Malnutrition Type:  Nutrition Problem: Severe Malnutrition Etiology: chronic illness   Malnutrition Characteristics:  Signs/Symptoms: moderate muscle depletion, severe fat depletion, severe muscle  depletion   Nutrition Interventions:  Interventions: Ensure Enlive (each supplement provides 350kcal and 20 grams of protein), MVI    DVT prophylaxis: Coumadin Family Communication:none Status is: Inpatient Remains inpatient appropriate because: Aspiration, acute kidney injury and worsening hyponatremia    Code Status:     Code Status Orders  (From admission, onward)           Start     Ordered   05/17/22 1932  Full code  Continuous        05/17/22 1933           Code Status History     Date Active Date Inactive Code Status Order ID Comments User Context   01/25/2022 1641 02/05/2022 1922 DNR [id number]  Lin Landsman, NP Inpatient   01/23/2022 1818 01/25/2022 1641 Full Code [id number]  Lequita Halt, MD ED   11/03/2021 1718 11/09/2021 1727 Full Code [id number]  Consuelo Pandy, PA-C Inpatient   09/29/2021 1430 10/04/2021 2049 Full Code [id number]  Jolaine Artist, MD Inpatient   05/14/2018 1145 05/23/2018 1709 Full Code [id number]  Natalio, [place], PA-C Inpatient   01/18/2018 1104 01/18/2018 1529 Full Code [id number]  Bensimhon, Shaune Pascal, MD Inpatient      Advance Directive Documentation    Flowsheet Row Most Recent Value  Type of Advance Directive Healthcare Power of Attorney  Pre-existing out of facility DNR order (yellow form or pink MOST form) --  "MOST" Form in Place? --         IV Access:   Peripheral IV   Procedures and diagnostic studies:   No results found.   Medical Consultants:   None.   Subjective:    Lance Hicks relates his breathing is improved this morning.  Objective:    Vitals:   05/28/22 1353 05/28/22 1929 05/29/22 0131 05/29/22 0542  BP: (!) 99/58 121/70 (!) 153/92 104/71  Pulse: 74 90 87 95  Resp: _0 Temp: 98.5 F (36.9 C) 98.1 F (36.7 C) 97.8 F (36.6 C) 97.7 F (36.5 C)  TempSrc:  Oral    SpO2: 93% 98% 94% 100%  Weight:      Height:       SpO2: 100 % O2 Flow Rate (L/min): 3  L/min   Intake/Output Summary (Last 24 hours) at 05/29/2022 0928 Last data filed at 05/29/2022 0845 Gross per 24 hour  Intake 223 ml  Output 400 ml  Net -177 ml    Filed Weights   05/26/22 0415 05/27/22 0521 05/28/22 0500  Weight: 58.6 kg 57.8 kg 58.7 kg    Exam: General exam: In no acute distress, cachectic Respiratory system: Good air movement with diffuse crackles bilaterally. Cardiovascular system: S1 & S2 heard, RRR. No JVD. Gastrointestinal system: Abdomen is nondistended, soft and nontender.  Extremities: No pedal edema. Skin: No rashes, lesions or ulcers Psychiatry: Judgement and insight appear normal. Mood & affect appropriate. Data Reviewed:    Labs: Basic Metabolic Panel: Recent Labs  Lab 05/23/22 0233 05/24/22 0256 05/25/22 0542 05/26/22 0505 05/27/22 0543 05/28/22 0534 05/29/22 0249  NA 125* 127* 130* 133* 134* 134* 132*  K 4.7 4.6 4.3 4.2 3.6 4.3 4.5  CL 93* 94* 98 103 97* 96* 93*  CO2 _1 GLUCOSE 71 79 90 146* 92 77  111*  BUN 36* 31* 31* 27* 15 26* 35*  CREATININE 1.51* 1.29* 1.33* 1.17 1.20 1.35* 1.38*  CALCIUM 9.4 9.9 10.2 9.6 9.5 9.4 9.7  MG 2.0 2.0 2.2  --   --   --  1.9  PHOS 2.6  --   --   --   --   --   --     GFR Estimated Creatinine Clearance: 38.4 mL/min (A) (by C-G formula based on SCr of 1.38 mg/dL (H)). Liver Function Tests: No results for input(s): AST, ALT, ALKPHOS, BILITOT, PROT, ALBUMIN in the last 168 hours.  No results for input(s): LIPASE, AMYLASE in the last 168 hours. No results for input(s): AMMONIA in the last 168 hours. Coagulation profile Recent Labs  Lab 05/25/22 0542 05/26/22 0505 05/27/22 0543 05/28/22 0534 05/29/22 0249  INR 3.3* 2.9* 3.5* 4.5* 4.4*    COVID-19 Labs  No results for input(s): DDIMER, FERRITIN, LDH, CRP in the last 72 hours.  Lab Results  Component Value Date   SARSCOV2NAA NEGATIVE 02/04/2022   [place] NEGATIVE 01/23/2022   [place] NEGATIVE 09/29/2021    [place] Not Detected 06/17/2021    CBC: Recent Labs  Lab 05/23/22 0233 05/24/22 0256 05/26/22 0505  WBC 3.5* 3.0* 3.4*  NEUTROABS 2.5 2.0  --   HGB 8.6* 8.9* 9.3*  HCT 27.6* 28.7* 30.8*  MCV 77.5* 78.8* 80.0  PLT 110* 128* 129*    Cardiac Enzymes: No results for input(s): CKTOTAL, CKMB, CKMBINDEX, TROPONINI in the last 168 hours. BNP (last 3 results) Recent Labs    07/04/21 1607 08/16/21 1620 02/23/22 1218  PROBNP 425.0* 601.0* 891.0*    CBG: Recent Labs  Lab 05/28/22 1127 05/28/22 1556 05/28/22 1941 05/28/22 2358 05/29/22 0427  GLUCAP 131* 112* 128* 140* 111*    D-Dimer: No results for input(s): DDIMER in the last 72 hours. Hgb A1c: No results for input(s): HGBA1C in the last 72 hours. Lipid Profile: No results for input(s): CHOL, HDL, LDLCALC, TRIG, CHOLHDL, LDLDIRECT in the last 72 hours. Thyroid function studies: No results for input(s): TSH, T4TOTAL, T3FREE, THYROIDAB in the last 72 hours.  Invalid input(s): FREET3 Anemia work up: No results for input(s): VITAMINB12, FOLATE, FERRITIN, TIBC, IRON, RETICCTPCT in the last 72 hours. Sepsis Labs: Recent Labs  Lab 05/23/22 0233 05/24/22 0256 05/26/22 0505  WBC 3.5* 3.0* 3.4*    Microbiology No results found for this or any previous visit (from the past 240 hour(s)).    Medications:    erythromycin   Left Eye Q6H   midodrine  5 mg Oral TID WC    morphine injection  4 mg Intravenous Once   sodium chloride flush  3 mL Intravenous Q12H   Continuous Infusions:  sodium chloride        LOS: 12 days   Charlynne Cousins  [facility]  05/29/2022, 9:28 AM

## 2022-05-30 DIAGNOSIS — J690 Pneumonitis due to inhalation of food and vomit: Principal | ICD-10-CM

## 2022-05-30 MED ORDER — LORAZEPAM 2 MG/ML IJ SOLN: 1.0000 mg | INTRAMUSCULAR | 0 refills | Status: AC | PRN

## 2022-05-30 MED ORDER — HALOPERIDOL 0.5 MG PO TABS: 0.5000 mg | ORAL_TABLET | ORAL | Status: AC | PRN

## 2022-06-23 ENCOUNTER — Encounter (HOSPITAL_COMMUNITY): Payer: Medicare Other | Admitting: Internal Medicine

## 2022-06-24 ENCOUNTER — Other Ambulatory Visit: Payer: Self-pay | Admitting: Medical

## 2022-06-24 NOTE — Progress Notes (Signed)
Lance Hicks called Lance Hicks about medication left here to be picked up. Lance told us to dispose of them and that he would not be picking them up.

## 2022-06-24 NOTE — Progress Notes (Signed)
Nursing Discharge Note   Admit Date: 05/17/2022  Discharge date: 06-28-22   Lance Hicks is  to be discharged to  [facility] per MD order.  AVS completed, placed in discharge packet for facility review. Discharge packet compiled for facility. Non-emergency ambulance transport arranged. Report called to Marissa Calamity RN  at [facility].   Allergies as of 06-28-22       Reactions   Augmentin [amoxicillin-pot Clavulanate] Anaphylaxis, Diarrhea   "Upset stomach"   Amoxicillin    Other reaction(s): Nausea/Vomiting   Chicken Allergy Other (See Comments)   Other reaction(s): Unknown   Clavulanic Acid    Other reaction(s): Nausea/Vomiting   Pantoprazole Sodium Nausea Only   Gets gassy and starting itching like crazy Gets gassy and starting itching like crazy   Tizanidine    Other reaction(s): See Comments   Valium [diazepam] Other (See Comments)   HA and Abd pain.   Tape Rash, Other (See Comments)   Surgical tape   Wound Dressing Adhesive Other (See Comments), Rash   Surgical tape Surgical tape Surgical tape        Medication List     STOP taking these medications    acetaminophen 500 MG tablet Commonly known as: TYLENOL   albuterol 108 (90 Base) MCG/ACT inhaler Commonly known as: VENTOLIN HFA   aspirin EC 81 MG tablet   busPIRone 7.5 MG tablet Commonly known as: BUSPAR   dexlansoprazole 60 MG capsule Commonly known as: DEXILANT   famotidine 40 MG tablet Commonly known as: Pepcid   FeroSul 325 (65 FE) MG tablet Generic drug: ferrous sulfate   midodrine 5 MG tablet Commonly known as: PROAMATINE   ondansetron 8 MG tablet Commonly known as: Zofran   Opsumit 10 MG tablet Generic drug: macitentan   potassium chloride 10 MEQ tablet Commonly known as: KLOR-CON M   rosuvastatin 5 MG tablet Commonly known as: CRESTOR   Selexipag 200 MCG Tabs   spironolactone 25 MG tablet Commonly known as: ALDACTONE   tadalafil 20 MG  tablet Commonly known as: CIALIS   tiZANidine 2 MG tablet Commonly known as: ZANAFLEX   torsemide 20 MG tablet Commonly known as: DEMADEX   warfarin 5 MG tablet Commonly known as: COUMADIN       TAKE these medications    haloperidol 0.5 MG tablet Commonly known as: HALDOL Take 1 tablet (0.5 mg total) by mouth every 4 (four) hours as needed for agitation (or delirium).   LORazepam 2 MG/ML injection Commonly known as: ATIVAN Inject 0.5 mLs (1 mg total) into the vein every 4 (four) hours as needed for anxiety.   optichamber diamond Misc        Discharge Instructions     Diet - low sodium heart healthy   Complete by: As directed    Diet - low sodium heart healthy   Complete by: As directed    Increase activity slowly   Complete by: As directed    Increase activity slowly   Complete by: As directed        [facility] to provide transportation to facility for patient. Non-emergency ambulance transport at bedside. Handoff completed with [facility] staff/EMTs.   Patient discharged from hospital unit via stretcher. Stable at time of discharge. PIV left in place for hospice facility use.

## 2022-06-24 NOTE — Discharge Summary (Signed)
Physician Discharge Summary  Lance Hicks MWN:[id number] DOB: 1946/09/18 DOA: 05/17/2022  PCP: [name], PA-C  Admit date: 05/17/2022 Discharge date: 2022-06-08  Admitted From: Home Disposition: Residential hospice facility   Recommendations for Outpatient Follow-up:  Patient will go for residential hospice facility. Continue comfort feeds.  Home Health:No  Equipment/Devices:None  Discharge Condition:Hospice CODE STATUS:DNR Diet recommendation: Comfort feeds.  Brief/Interim Summary:  76 y.o. male past medical history significant for chronic combined systolic and diastolic heart failure with an EF of 45%, CAD status post CABG, chronic atrial fibrillation and aortic stenosis status post mechanical aortic valve replacement on Coumadin, pulmonary hypertension, chronic kidney disease stage III yea, hepatic cirrhosis, anemia of chronic disease admitted to [facility] for acute kidney injury and hyponatremia, was seen by physician on May 2023 obtain blood work that showed a hemoglobin of 9 Hemoccult positive, transferred to [place] seen by GI [facility], Coumadin was initially held which was eventually restarted as no procedure was plan.  His renal function improved and is now close to baseline.  He is at moderate risk for aspiration and a barium swallow was recommended that showed severe pharyngeal dysphagia and poor pharyngeal mobility.  Patient will aspirate regardless of what he consumes.  Speech recommended n.p.o. except for ice chips.  Risk and benefits were explained to the patient and he would like to proceed with a diet he was placed on a dysphagia 2 diet.  His home dose of Aldactone and spironolactone have been resumed.  He was on a dysphagia diet and he started aspirating, palliative Care made with the patient and he decided to move towards comfort care after several days of meetings.  Discharge Diagnoses:  Principal Problem:   Acute renal failure superimposed on stage 3a  chronic kidney disease (HCC) Active Problems:   Hyponatremia   Acute on chronic anemia   Coronary artery disease involving native coronary artery of native heart with angina pectoris (HCC)   Chronic combined systolic and diastolic CHF (congestive heart failure) (HCC)   [name]   Pulmonary arterial hypertension (HCC)   Hypotension, chronic   Chronic atrial fibrillation (HCC)   COPD (chronic obstructive pulmonary disease) (HCC)   Heme positive stool   Chronic anticoagulation   FTT (failure to thrive) in adult  Acute kidney injury superimposed on chronic kidney disease stage IIIa: With a baseline creatinine 1.5-1.6 appear volume depleted diuretics were held he was fluid resuscitated his creatinine improved.  Acute respiratory failure with hypoxia secondary to aspiration pneumonia: Speech evaluated the patient deemed very high risk for aspiration. Long discussion with patient and he related he wanted to be try something to eat. Palliative Care was consulted he was made a DNR/DNI. He started eating and he was constantly aspirating, and he was requiring 10 L of high flow nasal cannula to keep saturations greater than 88%. He was given IV morphine. Palliative care met with patient and patient decided to move towards comfort care. Healthcare power of attorney and daughter were in agreement. Patient will go to residential hospice at [place].  Chronic hyponatremia: Likely due to heart failure.  Acute on chronic anemia: Noted.  Coronary artery disease of native arteries with angina pectoris: All medications was discontinued after discussing with the patient comfort measures.  Combined systolic and diastolic heart failure: All medications were discontinued.  History of mechanical aortic valve: Coumadin was discontinued.  Pulmonary hypertension: Moving towards comfort care medications were discontinued.  Chronic hypotension: He agreed on  discontinuing midodrine.  Chronic atrial fibrillation: All medications were discontinued moving towards comfort care.  Severe protein caloric malnutrition: Noted. Discharge Instructions  Discharge Instructions     Diet - low sodium heart healthy   Complete by: As directed    Diet - low sodium heart healthy   Complete by: As directed    Increase activity slowly   Complete by: As directed    Increase activity slowly   Complete by: As directed       Allergies as of 06-28-22       Reactions   Augmentin [amoxicillin-pot Clavulanate] Anaphylaxis, Diarrhea   "Upset stomach"   Amoxicillin    Other reaction(s): Nausea/Vomiting   Chicken Allergy Other (See Comments)   Other reaction(s): Unknown   Clavulanic Acid    Other reaction(s): Nausea/Vomiting   Pantoprazole Sodium Nausea Only   Gets gassy and starting itching like crazy Gets gassy and starting itching like crazy   Tizanidine    Other reaction(s): See Comments   Valium [diazepam] Other (See Comments)   HA and Abd pain.   Tape Rash, Other (See Comments)   Surgical tape   Wound Dressing Adhesive Other (See Comments), Rash   Surgical tape Surgical tape Surgical tape        Medication List     STOP taking these medications    acetaminophen 500 MG tablet Commonly known as: TYLENOL   albuterol 108 (90 Base) MCG/ACT inhaler Commonly known as: VENTOLIN HFA   aspirin EC 81 MG tablet   busPIRone 7.5 MG tablet Commonly known as: BUSPAR   dexlansoprazole 60 MG capsule Commonly known as: DEXILANT   famotidine 40 MG tablet Commonly known as: Pepcid   FeroSul 325 (65 FE) MG tablet Generic drug: ferrous sulfate   midodrine 5 MG tablet Commonly known as: PROAMATINE   ondansetron 8 MG tablet Commonly known as: Zofran   Opsumit 10 MG tablet Generic drug: macitentan   potassium chloride 10 MEQ tablet Commonly known as: KLOR-CON M   rosuvastatin 5 MG tablet Commonly known as: CRESTOR   Selexipag 200  MCG Tabs   spironolactone 25 MG tablet Commonly known as: ALDACTONE   tadalafil 20 MG tablet Commonly known as: CIALIS   tiZANidine 2 MG tablet Commonly known as: ZANAFLEX   torsemide 20 MG tablet Commonly known as: DEMADEX   warfarin 5 MG tablet Commonly known as: COUMADIN       TAKE these medications    haloperidol 0.5 MG tablet Commonly known as: HALDOL Take 1 tablet (0.5 mg total) by mouth every 4 (four) hours as needed for agitation (or delirium).   LORazepam 2 MG/ML injection Commonly known as: ATIVAN Inject 0.5 mLs (1 mg total) into the vein every 4 (four) hours as needed for anxiety.   optichamber diamond Misc        Allergies  Allergen Reactions   Augmentin [Amoxicillin-Pot Clavulanate] Anaphylaxis and Diarrhea    "Upset stomach"   Amoxicillin     Other reaction(s): Nausea/Vomiting   Chicken Allergy Other (See Comments)    Other reaction(s): Unknown   Clavulanic Acid     Other reaction(s): Nausea/Vomiting   Pantoprazole Sodium Nausea Only    Gets gassy and starting itching like crazy Gets gassy and starting itching like crazy   Tizanidine     Other reaction(s): See Comments   Valium [Diazepam] Other (See Comments)    HA and Abd pain.   Tape Rash and Other (See Comments)    Surgical tape   Wound Dressing  Adhesive Other (See Comments) and Rash    Surgical tape Surgical tape Surgical tape    Consultations: Pulmonary and critical care Palliative care   Procedures/Studies: DG Chest 2 View  Result Date: 05/16/2022 CLINICAL DATA:  CHF. EXAM: CHEST - 2 VIEW COMPARISON:  02/23/2022 FINDINGS: Lungs are adequately inflated without focal airspace consolidation. Findings suggest a small amount of right pleural fluid without significant change. Minimal hazy prominence of the perihilar markings suggesting a degree of vascular congestion without significant change. Mild stable cardiomegaly. Remainder of the exam is unchanged. IMPRESSION: Stable  cardiomegaly with suggestion of minimal vascular congestion. Small amount right pleural fluid unchanged. Electronically Signed   By: Marin Olp M.D.   On: 05/16/2022 16:58   DG Abd 1 View  Result Date: 05/18/2022 CLINICAL DATA:  Abdominal pain and constipation EXAM: ABDOMEN - 1 VIEW COMPARISON:  CT scan 01/31/2022 FINDINGS: Epicardial pacer lead fragments noted along the thoracoabdominal junction. Barium outlines diverticula in the colon, primarily the descending and sigmoid colon. Atherosclerosis is present, including aortoiliac atherosclerotic disease. No dilated bowel. 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 IMPRESSION: 1. Barium outlines diverticula primarily in the descending and sigmoid colon. No substantial colonic stool burden. 2.  Aortic Atherosclerosis (ICD10-I70.0). 3. Pars defects at L5 and substantial loss of disc height at L4-5 as shown on 01/31/2022. Electronically Signed   By: Van Clines M.D.   On: 05/18/2022 10:47   DG Swallowing Func-Speech Pathology  Result Date: 05/23/2022 Table formatting from the original result was not included. Images from the original result were not included. 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 In light of concurrent left nephrolithiasis, will check CT renal colic next year instead of renal US.   Anticoagulated on Coumadin 01/04/2018  Anxiety 05/10/2016  Last Assessment & Plan:  Doing well off of zoloft.  Asthma    Asymptomatic microscopic hematuria 06/20/2017  Last Assessment & Plan:  Had hematuria workup in [facility] in 2016 which negative CT and cystoscopy. UA with 2+ blood last visit - we discussed recommendation for repeat workup at 5 years or if degree of hematuria progresses.   Atrial fibrillation ([place]) 03/29/2017  Atrial flutter ([place]) 03/29/2017  Backache 12/14/2015  Last Assessment & Plan:  Pain management referral for further evaluation.  Bilateral pleural effusion 08/09/2017  CHF (congestive heart failure) (HCC)   Chronic allergic rhinitis 04/28/2016  Last Assessment & Plan:  Continue astelin  Chronic anticoagulation 03/29/2017  Chronic atrial fibrillation ([place]) 07/23/2015  Last Assessment & Plan:  Coumadin and metoprolol, cardiology referral to establish care.  Chronic midline back pain 12/14/2015  Last Assessment & Plan:  Pain management referral for further evaluation.  Chronic obstructive lung disease ([place]) 06/12/2016  With hypoxia  Chronic prostatitis 07/23/2015  Last Assessment & Plan:  Has largely resolved since stopping bike riding. Recommend annual DRE AND PSA - will see back 12/2015 for annual screening, given 1st degree fhx. To call office for recurrent prostatitis symptoms.   Chronic respiratory failure with  hypoxia ([place]) 07/29/2019  Cirrhosis of liver not due to alcohol ([place]) 07/01/2019  COPD (chronic obstructive pulmonary disease) ([place]) 06/12/2016  Coronary arteriosclerosis in native artery 03/29/2017  Coronary artery disease involving native coronary artery of native heart with angina pectoris ([place]) 03/29/2017  Cough 10/17/2016  Last Assessment & Plan:  Discussed typical course for acute viral illness. If symptoms worsen or fail to improve by 7-10d, delayed ATBs, fluids, rest, NSAIDs/APAP prn. Seek care if not improving. Needs earlier INR check due to ATBs.  Dyspnea 02/01/2016  Last Assessment & Plan:  Overall improving, eval by pulm, plan for CT, neg stress test with cardiology. Recent switch to carvedilol due to side effects.   Encounter for therapeutic drug monitoring 01/06/2019  Enterococcal bacteremia   Epidermoid cyst of skin 08/24/2017  Essential hypertension 12/14/2015  Last Assessment & Plan:  Hypertension control: controlled  Medications: compliant Medication Management: as noted in orders Home blood pressure monitoring recommended additionally as needed for symptoms  The patient's care plan was reviewed and updated. Instructions and counseling were provided regarding patient goals and barriers. He was counseled to adopt a healthy lifestyle. Educational resources and self-management tools have been provided as charted in [facility] list.   H/O maze procedure 03/29/2017  [name] 03/29/2017  Overview:  2011  History of coronary artery bypass graft 03/29/2017  Hx of CABG 03/29/2017  Hyperlipidemia 03/29/2017  Hypersensitivity angiitis ([place]) 10/01/2017  Hypertensive heart disease 03/29/2017  Hypertensive heart disease with heart failure ([place]) 03/29/2017  Hypertensive heart failure ([place]) 03/29/2017  Hypokalemia due to excessive renal loss of potassium 02/18/2018  Hypotension, chronic 06/06/2018  International normalized ratio (INR) raised 07/26/2017  Kidney stone 07/23/2015  Kidney stones 07/23/2015  Overview:  x 3  Last Assessment & Plan:  By [place] has left nephrolithiasis, but not visible by KUB. Will check CT renal colic next year to assess both stone burden as well as to surveil AML.   Left ureteral stone 01/23/2018  Leukocytoclastic vasculitis ([place]) 10/01/2017  Localized edema 01/04/2018  Long term (current) use of anticoagulants 03/29/2017  Lumbar radicular pain 01/19/2016  Lumbar radiculopathy 01/19/2016  Maculopapular rash 09/03/2017  Microscopic hematuria 06/20/2017  Last Assessment & Plan:  Had hematuria workup in [facility] in 2016 which negative CT and cystoscopy. UA with 2+ blood last visit - we discussed recommendation for repeat workup at 5 years or if degree of hematuria progresses.   Multiple nodules of lung 06/12/2016  Nasal discharge  02/25/2016  Last Assessment & Plan:  Trial zyrtec and flonase  Nephrolithiasis 07/23/2015  Overview:  x 3  Last Assessment & Plan:  By [place] has left nephrolithiasis, but not visible by KUB. Will check CT renal colic next year to assess both stone burden as well as to surveil AML.   Overview:  x 3  Last Assessment & Plan:  Has 24m nonobstructing LUP stone - not visible by KUB.  Will check renal [place]2019 - he will contact office sooner if symptomatic.   Non-sustained ventricular tachycardia ([place] 03/29/2017  Nonsustained ventricular tachycardia ([place] 03/29/2017  Other hyperlipidemia 03/29/2017  Palpitations 10/01/2017  Pleural effusion, bilateral 08/09/2017  Pneumonia due to COVID-19 virus 02/07/2021  Post-nasal drainage 02/25/2016  Last Assessment & Plan:  Trial zyrtec and flonase  Prostate cancer screening 06/20/2017  Last Assessment & Plan:  Recommend continued annual CaP screening until within 10 years of life expectancy. Given good health and fhx of longevity, would anticipate CaP screening to continue until age 75  PSA  today and again in one year on day of visit.  Pulmonary arterial hypertension ([place]) 02/20/2018  Pulmonary edema   Pulmonary hypertension ([place]) 08/09/2017  Pulmonary nodules 06/12/2016  S/P AVR 03/20/2016  S/P AVR (aortic valve replacement) 03/20/2016  Strain of deltoid muscle, initial encounter   Supratherapeutic INR 07/26/2017  Syncope and collapse 02/01/2016  Typical atrial flutter ([place]) 02/01/2016 Past Surgical History: Past Surgical History: Procedure Laterality Date  CHOLECYSTECTOMY    CORONARY ARTERY BYPASS GRAFT    EXPLORATORY LAPAROTOMY  07/30/2017  FOOT SURGERY    FRACTURE SURGERY Right   wrist and forearm  HERNIA REPAIR    MECHANICAL AORTIC VALVE REPLACEMENT    NASAL SINUS SURGERY    RIGHT HEART CATH N/A 01/18/2018  Procedure: RIGHT HEART CATH;  Surgeon: Jolaine Artist, MD;  Location: [facility];  Service: Cardiovascular;  Laterality: N/A;  RIGHT HEART CATH N/A 05/14/2018  Procedure: RIGHT HEART CATH;   Surgeon: Jolaine Artist, MD;  Location: [facility];  Service: Cardiovascular;  Laterality: N/A;  RIGHT HEART CATH N/A 09/29/2021  Procedure: RIGHT HEART CATH;  Surgeon: Jolaine Artist, MD;  Location: [facility] [facility];  Service: Cardiovascular;  Laterality: N/A;  TEE WITHOUT CARDIOVERSION N/A 05/21/2018  Procedure: TRANSESOPHAGEAL ECHOCARDIOGRAM (TEE);  Surgeon: Jolaine Artist, MD;  Location: [facility];  Service: Cardiovascular;  Laterality: N/A;  UPPER GASTROINTESTINAL ENDOSCOPY  07/12/2017  Patchy areas of mucosal inflammation noted in the antrum with edema,erthema and ulcerations. Bx. Chronicfocally active gastritis.  VASECTOMY   HPI: Pt is a 76 yo male transferred from [place] with AKI and CKD3, hyponatremia, acute on chronic anemia.  Has history of dysphagia,  recent nausea/vomiting, GERD, anemia, cirrhosis, CHF, COPD,  pulmonary hypertension, coronary artery disease with prior CABG and  mechanical AVR, chronic respiratory failure, aortic stenosis and  atrial fibrillation. History of EGD in 2018 with gastritis. Hgb stable, pt on coumadin.  Hospital coarse compicated by delirium.  RN noted pt coughing with intake of liquids and SLP swallow eval thus ordered.  Per notes in chart, pt also has FTT.  UGI study 5/10 showed aspiration episode x1 per radiologist note -- SLP reviewed flouro loop and pt also had pharyngeal retention.  Pt reports h/o sensing retention in throat and esophagus and chronic cough since he had COVID this year.   He does endorse improvement with being on reflux medications.  Has had pna twice since COVID per pt.  Eats 2 meals a day.  Denies need for heimlich manuever.  Subjective: pt awake in chair  Recommendations for follow up therapy are one component of a multi-disciplinary discharge planning process, led by the attending physician.  Recommendations may be updated based on patient status, additional functional criteria and insurance authorization. Assessment / Plan  / Recommendation   05/23/2022   3:56 PM Clinical Impressions Clinical Impression Pt given limited barium including thin, nectar and single bolus of puree.  Moderate oral dysphagia characterized by weakness - causing pt to expend significant effort to elicit oral transiting with delay. He tends to dip head forward to help elicit a swallow and admits it requires effort.   Severe pharyngeal dysphagia evidenced by poor pharyngeal motility resulting in retention throughout pharynx with all consistencies that mixed with secretions.  Retention did not clear despite liquid swallow and cued dry swallows *cued dry swallows inconsistently conducted and appeared to fatigue pt.  Cued "hock" was not produced despite demonstration from SLP x2 and thus pt did not clear vallecular  retention.  Aspiration of nectar observed due to poor motility and did not clear despite reflexive cough.  HOB reclined and head turn right/left did not prevent or facilitate pharyngeal clearance. Following MBS, pt admitted to chronic problems swallowing.   Pt will aspirate regardless of consistency he consumes and retention is worse with increased visocity.     Discussed with pt severity of his dysphagia with aspiration (Of even secretions).  He advised that he NOT want to be a full code.        Recommend consider palliative consult to establish [place] given pt's deconditioning and level of dysphagia.  Recommend NPO x ice chips and change medications to IV (that can be changed).  PO Intake for comfort with accepted risks may align with pt's goals.  Prognosis for swallow function to return is guarded given chronicity of and severity of dysphagia. SLP Visit Diagnosis Dysphagia, oropharyngeal phase (R13.12) Impact on safety and function Risk for inadequate nutrition/hydration;Severe aspiration risk     05/23/2022   3:56 PM Treatment Recommendations Treatment Recommendations Therapy as outlined in treatment plan below     05/23/2022   3:58 PM Prognosis Prognosis  for Safe Diet Advancement Guarded Barriers to Reach Goals Time post onset;Severity of deficits   05/23/2022   3:56 PM Diet Recommendations SLP Diet Recommendations Ice chips PRN after oral care Medication Administration Via alternative means     05/23/2022   3:56 PM Other Recommendations Oral Care Recommendations Oral care BID Other Recommendations Have oral suction available Follow Up Recommendations Other (comment) Functional Status Assessment Patient has had a recent decline in their functional status and/or demonstrates limited ability to make significant improvements in function in a reasonable and predictable amount of time    View : No data to display.        05/23/2022   3:49 PM Oral Phase Oral Phase Impaired Oral - Nectar Teaspoon Decreased bolus cohesion Oral - Nectar Cup Decreased bolus cohesion;Weak lingual manipulation;Premature spillage;Reduced posterior propulsion Oral - Nectar Straw Decreased bolus cohesion;Weak lingual manipulation;Premature spillage;Reduced posterior propulsion Oral - Thin Teaspoon Decreased bolus cohesion;Weak lingual manipulation;Premature spillage;Reduced posterior propulsion Oral - Thin Cup Decreased bolus cohesion;Weak lingual manipulation;Premature spillage;Reduced posterior propulsion Oral - Thin Straw Decreased bolus cohesion;Weak lingual manipulation;Premature spillage;Reduced posterior propulsion Oral - Puree Premature spillage;Reduced posterior propulsion    05/23/2022   3:50 PM Pharyngeal Phase Pharyngeal Phase Impaired Pharyngeal- Nectar Teaspoon Reduced pharyngeal peristalsis;Reduced epiglottic inversion;Pharyngeal residue - valleculae;Pharyngeal residue - pyriform;Reduced airway/laryngeal closure;Reduced tongue base retraction;Delayed swallow initiation-vallecula Pharyngeal Material does not enter airway Pharyngeal- Nectar Cup Delayed swallow initiation-vallecula;Reduced epiglottic inversion;Reduced pharyngeal peristalsis;Reduced tongue base retraction;Reduced  airway/laryngeal closure;Pharyngeal residue - pyriform;Pharyngeal residue - valleculae;Moderate aspiration;Penetration/Aspiration during swallow Pharyngeal Material enters airway, passes BELOW cords and not ejected out despite cough attempt by patient Pharyngeal- Nectar Straw Delayed swallow initiation-vallecula;Reduced epiglottic inversion;Reduced anterior laryngeal mobility;Reduced laryngeal elevation;Reduced airway/laryngeal closure;Reduced tongue base retraction;Pharyngeal residue - valleculae;Pharyngeal residue - pyriform Pharyngeal Material does not enter airway Pharyngeal- Thin Teaspoon Pharyngeal residue - pyriform;Reduced pharyngeal peristalsis;Reduced epiglottic inversion;Reduced anterior laryngeal mobility;Reduced laryngeal elevation;Reduced airway/laryngeal closure;Reduced tongue base retraction;Delayed swallow initiation-vallecula;Lateral channel residue Pharyngeal Material does not enter airway Pharyngeal- Thin Cup Delayed swallow initiation-vallecula;Reduced pharyngeal peristalsis;Reduced epiglottic inversion;Reduced anterior laryngeal mobility;Reduced laryngeal elevation;Reduced airway/laryngeal closure;Reduced tongue base retraction;Pharyngeal residue - valleculae;Pharyngeal residue - pyriform;Lateral channel residue Pharyngeal Material does not enter airway Pharyngeal- Thin Straw Delayed swallow initiation-vallecula;Reduced pharyngeal peristalsis;Reduced epiglottic inversion;Reduced anterior laryngeal mobility;Reduced laryngeal elevation;Reduced tongue base retraction;Lateral channel residue Pharyngeal Material does not enter airway Pharyngeal- Puree Reduced  epiglottic inversion;Reduced pharyngeal peristalsis;Reduced tongue base retraction;Pharyngeal residue - valleculae;Pharyngeal residue - pyriform Pharyngeal Material does not enter airway    05/23/2022   3:56 PM Cervical Esophageal Phase  Cervical Esophageal Phase Impaired Kathleen Lime, MS [facility] SLP Acute Rehab Services Office [phone] Pager  [phone] Macario Golds 05/23/2022, 4:00 PM                     DG UGI W SMALL BOWEL DOUBLE CM  Result Date: 05/03/2022 CLINICAL DATA:  Patient with past medical history of dysphagia, recent nausea/vomiting, GERD, anemia, cirrhosis, CHF, COPD, pulmonary hypertension, coronary artery disease with prior CABG and mechanical AVR, chronic respiratory failure, aortic stenosis and atrial fibrillation. History of EGD in 2018 with gastritis. EXAM: DG UGI W/ SMALL BOWEL TECHNIQUE: Scout radiograph was obtained. Combined double and single contrast examination was performed using effervescent crystals, high-density barium and thin liquid barium. This exam was performed by APP name, and was supervised and interpreted by Rad name. FLUOROSCOPY: Radiation Exposure Index (as provided by the fluoroscopic device): 70.10 mGy Kerma COMPARISON:  CT abdomen/pelvis 01/31/22 FINDINGS: Scout Radiograph: Normal bowel gas pattern. Retained epicardial pacing wires and previous median sternotomy noted. Aortic and branch vessel atherosclerosis. Esophagus: Normal appearance. Esophageal motility: Within normal limits for age. A single episode of aspiration into the trachea was noted. Esophageal reflux: No significant reflux elicited by provocative maneuvers Ingested 63m barium tablet: Passed normally Stomach: Mild diffuse rugal thickening without ulceration or focal mucosal abnormality. No significant hiatal hernia. Gastric emptying: Normal. Duodenum: Normal appearance. Other: Normal small bowel transit time. No small bowel wall thickening or distention identified. At subsequent fluoroscopy, no abnormality of the terminal ileum identified. IMPRESSION: 1. Single episode of silent aspiration noted. 2. Normal esophageal motility without focal mucosal abnormality. 3. Mild rugal fold thickening in the stomach as can be seen with gastritis. No evidence of ulceration. 4. The small bowel appears normal. Electronically Signed   By: WRichardean SaleM.D.   On: 05/03/2022 13:38   (Echo, Carotid, EGD, Colonoscopy, ERCP)    Subjective: No complaints  Discharge Exam: Vitals:   [id number]  BP:    Pulse:    Resp: (!) 26 20  Temp:    SpO2: (!) 88% 92%   Vitals:   05/29/22 2141 05/29/22 2200 [id number]  BP: (!) 84/58     Pulse: 86     Resp: 12  (!) 26 20  Temp: 98.4 F (36.9 C)     TempSrc: Axillary     SpO2: 100% 95% (!) 88% 92%  Weight:      Height:        General: Pt is alert, awake, not in acute distress Cardiovascular: RRR, S1/S2 +, no rubs, no gallops Respiratory: CTA bilaterally, no wheezing, no rhonchi Abdominal: Soft, NT, ND, bowel sounds + Extremities: no edema, no cyanosis    The results of significant diagnostics from this hospitalization (including imaging, microbiology, ancillary and laboratory) are listed below for reference.     Microbiology: No results found for this or any previous visit (from the past 240 hour(s)).   Labs: BNP (last 3 results) Recent Labs    01/12/22 1524 01/23/22 1145 05/16/22 1650  BNP 1,341.7* 1,831.8* CANCELED   Basic Metabolic Panel: Recent Labs  Lab 05/24/22 0256 05/25/22 0542 05/26/22 0505 05/27/22 0543 05/28/22 0534 05/29/22 0249  NA 127* 130* 133* 134* 134* 132*  K 4.6 4.3 4.2 3.6 4.3 4.5  CL 94*  98 103 97* 96* 93*  CO2 _0 GLUCOSE 79 90 146* 92 77 111*  BUN 31* 31* 27* 15 26* 35*  CREATININE 1.29* 1.33* 1.17 1.20 1.35* 1.38*  CALCIUM 9.9 10.2 9.6 9.5 9.4 9.7  MG 2.0 2.2  --   --   --  1.9   Liver Function Tests: No results for input(s): AST, ALT, ALKPHOS, BILITOT, PROT, ALBUMIN in the last 168 hours. No results for input(s): LIPASE, AMYLASE in the last 168 hours. No results for input(s): AMMONIA in the last 168 hours. CBC: Recent Labs  Lab 05/24/22 0256 05/26/22 0505  WBC 3.0* 3.4*  NEUTROABS 2.0  --   HGB 8.9* 9.3*  HCT 28.7* 30.8*  MCV 78.8* 80.0  PLT 128* 129*   Cardiac  Enzymes: No results for input(s): CKTOTAL, CKMB, CKMBINDEX, TROPONINI in the last 168 hours. BNP: Invalid input(s): POCBNP CBG: Recent Labs  Lab 05/28/22 1127 05/28/22 1556 05/28/22 1941 05/28/22 2358 05/29/22 0427  GLUCAP 131* 112* 128* 140* 111*   D-Dimer No results for input(s): DDIMER in the last 72 hours. Hgb A1c No results for input(s): HGBA1C in the last 72 hours. Lipid Profile No results for input(s): CHOL, HDL, LDLCALC, TRIG, CHOLHDL, LDLDIRECT in the last 72 hours. Thyroid function studies No results for input(s): TSH, T4TOTAL, T3FREE, THYROIDAB in the last 72 hours.  Invalid input(s): FREET3 Anemia work up No results for input(s): VITAMINB12, FOLATE, FERRITIN, TIBC, IRON, RETICCTPCT in the last 72 hours. Urinalysis    Component Value Date/Time   COLORURINE YELLOW 05/18/2022 0003   APPEARANCEUR CLEAR 05/18/2022 0003   LABSPEC 1.009 05/18/2022 0003   PHURINE 6.0 05/18/2022 0003   GLUCOSEU 150 (A) 05/18/2022 0003   HGBUR NEGATIVE 05/18/2022 0003   BILIRUBINUR NEGATIVE 05/18/2022 0003   BILIRUBINUR negative 06/17/2021 1521   KETONESUR NEGATIVE 05/18/2022 0003   PROTEINUR NEGATIVE 05/18/2022 0003   UROBILINOGEN 0.2 06/17/2021 1521   NITRITE NEGATIVE 05/18/2022 0003   LEUKOCYTESUR NEGATIVE 05/18/2022 0003   Sepsis Labs Invalid input(s): PROCALCITONIN,  WBC,  LACTICIDVEN Microbiology No results found for this or any previous visit (from the past 240 hour(s)).    SIGNED:   Charlynne Cousins, MD  [facility] 06-09-2022, 8:31 AM Pager   If 7PM-7AM, please contact night-coverage [email or web address] Password [email or web address]

## 2022-06-24 NOTE — Progress Notes (Signed)
Patient has home medications stored in Pharmacy. Patient is unable to sign for medication return due to mentation and current clinical status. Spoke with [employer] and she stated that she will come to hospital to pick up patient's medications. PTAR is at bedside to transport patient to [facility]. I agreed with Stana Bunting that she will meet patient at [facility] and once he is settled there she will come to [facility] to sign for medications and I will release them to her. Dawn verbalized understanding of plan.

## 2022-06-24 NOTE — TOC Transition Note (Signed)
Transition of Care [facility]) - CM/SW Discharge Note   Patient Details  Name: Lance Hicks MRN: [id number] Date of Birth: 04-Apr-1946  [facility] CM/SW Contact:  Vassie Moselle, [place] Phone Number: 15-Jun-2022, 9:24 AM   Clinical Narrative:    Pt has been accepted to [facility] and is to discharge to their facility today. Pt is DNR and requires O2. Pt's HCPOA Dawn and Illene Bolus have been notified of pt's transfer and are agreeable. Call to report is [phone]. PTAR called at 9:55am. No further TOC needs identified at this time.    Final next level of care: [place] Barriers to Discharge: Barriers Resolved   Patient Goals and CMS Choice Patient states their goals for this hospitalization and ongoing recovery are:: To go home CMS Medicare.gov Compare Post Acute Care list provided to:: Patient Choice offered to / list presented to : [facility] POA / Guardian, Patient  Discharge Placement                Patient to be transferred to facility by: [place] Name of family member notified: Illene Bolus, HCPOA Patient and family notified of of transfer: Jun 15, 2022  Discharge Plan and Services   Discharge Planning Services: CM Consult            DME Arranged: N/A DME Agency: [facility] [place] Determinants of Health (SDOH) Interventions     Readmission Risk Interventions    06-15-22    9:22 AM 05/22/2022   10:34 AM  Readmission Risk Prevention Plan  Transportation Screening Complete   Medication Review ([place]) Complete Complete  PCP or Specialist appointment within 3-5 days of discharge Complete   HRI or [place] Complete Complete  SW Recovery Care/Counseling Consult Complete Complete  Palliative Care Screening Complete Not [place] Complete Not Applicable

## 2022-06-24 DEATH — deceased

## 2022-06-29 ENCOUNTER — Ambulatory Visit: Payer: Medicare Other | Admitting: Podiatry
